# Patient Record
Sex: Female | Born: 1935 | Race: White | Hispanic: No | Smoking: Former smoker
Health system: Southern US, Community
[De-identification: ages and names within clinical notes are randomized; demographics above are authoritative.]

## PROBLEM LIST (undated history)

## (undated) DIAGNOSIS — T4145XA Adverse effect of unspecified anesthetic, initial encounter: Secondary | ICD-10-CM

## (undated) DIAGNOSIS — I2510 Atherosclerotic heart disease of native coronary artery without angina pectoris: Secondary | ICD-10-CM

## (undated) DIAGNOSIS — Z975 Presence of (intrauterine) contraceptive device: Secondary | ICD-10-CM

## (undated) DIAGNOSIS — Z923 Personal history of irradiation: Secondary | ICD-10-CM

## (undated) DIAGNOSIS — M1990 Unspecified osteoarthritis, unspecified site: Secondary | ICD-10-CM

## (undated) DIAGNOSIS — I10 Essential (primary) hypertension: Secondary | ICD-10-CM

## (undated) DIAGNOSIS — N813 Complete uterovaginal prolapse: Secondary | ICD-10-CM

## (undated) DIAGNOSIS — D649 Anemia, unspecified: Secondary | ICD-10-CM

## (undated) DIAGNOSIS — I6529 Occlusion and stenosis of unspecified carotid artery: Secondary | ICD-10-CM

## (undated) DIAGNOSIS — J189 Pneumonia, unspecified organism: Secondary | ICD-10-CM

## (undated) DIAGNOSIS — R0600 Dyspnea, unspecified: Secondary | ICD-10-CM

## (undated) DIAGNOSIS — R200 Anesthesia of skin: Secondary | ICD-10-CM

## (undated) DIAGNOSIS — R7303 Prediabetes: Secondary | ICD-10-CM

## (undated) DIAGNOSIS — Z9289 Personal history of other medical treatment: Secondary | ICD-10-CM

## (undated) DIAGNOSIS — K5792 Diverticulitis of intestine, part unspecified, without perforation or abscess without bleeding: Secondary | ICD-10-CM

## (undated) DIAGNOSIS — K5900 Constipation, unspecified: Secondary | ICD-10-CM

## (undated) DIAGNOSIS — Z972 Presence of dental prosthetic device (complete) (partial): Secondary | ICD-10-CM

## (undated) DIAGNOSIS — E039 Hypothyroidism, unspecified: Secondary | ICD-10-CM

## (undated) DIAGNOSIS — C3490 Malignant neoplasm of unspecified part of unspecified bronchus or lung: Secondary | ICD-10-CM

## (undated) DIAGNOSIS — T8859XA Other complications of anesthesia, initial encounter: Secondary | ICD-10-CM

## (undated) DIAGNOSIS — M419 Scoliosis, unspecified: Secondary | ICD-10-CM

## (undated) DIAGNOSIS — Z973 Presence of spectacles and contact lenses: Secondary | ICD-10-CM

## (undated) DIAGNOSIS — N189 Chronic kidney disease, unspecified: Secondary | ICD-10-CM

## (undated) HISTORY — DX: Presence of (intrauterine) contraceptive device: Z97.5

## (undated) HISTORY — PX: SPINE SURGERY: SHX786

## (undated) HISTORY — DX: Personal history of irradiation: Z92.3

## (undated) HISTORY — DX: Malignant neoplasm of unspecified part of unspecified bronchus or lung: C34.90

## (undated) HISTORY — PX: OTHER SURGICAL HISTORY: SHX169

## (undated) HISTORY — PX: TONSILLECTOMY: SUR1361

## (undated) HISTORY — PX: FRACTURE SURGERY: SHX138

## (undated) HISTORY — PX: BACK SURGERY: SHX140

## (undated) HISTORY — PX: ELBOW SURGERY: SHX618

---
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
## 1978-08-25 HISTORY — PX: BACK SURGERY: SHX140

## 2002-03-04 ENCOUNTER — Encounter: Admission: RE | Admit: 2002-03-04 | Discharge: 2002-03-04 | Payer: Self-pay | Admitting: Internal Medicine

## 2002-03-04 ENCOUNTER — Encounter: Payer: Self-pay | Admitting: Internal Medicine

## 2002-03-17 ENCOUNTER — Encounter: Admission: RE | Admit: 2002-03-17 | Discharge: 2002-03-17 | Payer: Self-pay | Admitting: Internal Medicine

## 2002-03-17 ENCOUNTER — Encounter: Payer: Self-pay | Admitting: Internal Medicine

## 2002-03-31 ENCOUNTER — Other Ambulatory Visit: Admission: RE | Admit: 2002-03-31 | Discharge: 2002-03-31 | Payer: Self-pay | Admitting: Obstetrics and Gynecology

## 2002-04-08 ENCOUNTER — Encounter: Admission: RE | Admit: 2002-04-08 | Discharge: 2002-04-08 | Payer: Self-pay | Admitting: Obstetrics and Gynecology

## 2002-04-08 ENCOUNTER — Encounter: Payer: Self-pay | Admitting: Obstetrics and Gynecology

## 2003-10-13 ENCOUNTER — Encounter: Admission: RE | Admit: 2003-10-13 | Discharge: 2003-10-13 | Payer: Self-pay | Admitting: Internal Medicine

## 2003-10-30 ENCOUNTER — Encounter: Admission: RE | Admit: 2003-10-30 | Discharge: 2003-10-30 | Payer: Self-pay | Admitting: Internal Medicine

## 2003-12-01 ENCOUNTER — Ambulatory Visit (HOSPITAL_COMMUNITY): Admission: RE | Admit: 2003-12-01 | Discharge: 2003-12-01 | Payer: Self-pay | Admitting: Gastroenterology

## 2003-12-01 ENCOUNTER — Encounter (INDEPENDENT_AMBULATORY_CARE_PROVIDER_SITE_OTHER): Payer: Self-pay | Admitting: *Deleted

## 2010-08-27 ENCOUNTER — Encounter
Admission: RE | Admit: 2010-08-27 | Discharge: 2010-08-27 | Payer: Self-pay | Source: Home / Self Care | Attending: Internal Medicine | Admitting: Internal Medicine

## 2010-09-15 ENCOUNTER — Encounter: Payer: Self-pay | Admitting: Internal Medicine

## 2011-01-10 NOTE — Op Note (Signed)
NAME:  Abigail [name], Abigail [name]                            ACCOUNT NO.:  [id number]   MEDICAL RECORD NO.:  [id number]                   PATIENT TYPE:  AMB   LOCATION:  ENDO                                 FACILITY:  [facility]   PHYSICIAN:  Anselmo Rod, M.D.               DATE OF BIRTH:  [birth year]   DATE OF PROCEDURE:  12/01/2003  DATE OF DISCHARGE:                                 OPERATIVE REPORT   PROCEDURE:  Colonoscopy with snare polypectomy x 4 and cold biopsies x 1.   ENDOSCOPIST:  Charna Elizabeth, M.D.   INSTRUMENT USED:  Olympus video colonoscope.   INDICATIONS FOR PROCEDURE:  75 year old white female undergoing screening  colonoscopy.  The patient has a family history of colon cancer and recent  change in bowel habits.  Rule out colonic polyps, masses, etc.   PREPROCEDURE PREPARATION:  Informed consent was procured from the patient.  The patient was fasted for eight hours prior to the procedure and prepped  with a bottle of magnesium citrate and a gallon of GoLYTELY the night prior  to the procedure.   PREPROCEDURE PHYSICAL:  Patient with stable vital signs.  Neck supple.  Chest clear to auscultation.  S1 and S2 regular.  Abdomen soft with normal  bowel sounds.   DESCRIPTION OF PROCEDURE:  The patient was placed in the left lateral  decubitus position, sedated with 60 mg of Demerol and 7 mg Versed in slow  incremental doses.  Once the patient was adequately sedated, maintained on  low flow oxygen and continuous cardiac monitoring, the Olympus video  colonoscope was advanced into the rectum to the cecum.  The appendiceal  orifice and ileocecal valve were clearly visualized and photographed.  There  was extensive sigmoid diverticulosis and abdominal discomfort with  insufflation of air into the colon indicating a component of visceral  hypersensitivity consistent with IBS.  Two small polyps were snared from the  rectum, three polyps were removed from the left colon, two by snare  polypectomy and one by cold biopsy forceps.  A few scattered diverticula  were seen along the hepatic flexure.  The right colon appeared normal.   IMPRESSION:  1. Multiple colonic polyps removed by snare polypectomy and one removed by     cold biopsy forceps (see description above).  2. Extensive sigmoid diverticulosis with a few scattered diverticula around     the hepatic flexure.  3. Visceral hypersensitivity indicated by abdominal discomfort while     insufflating air in the colon, question IBS.   RECOMMENDATIONS:  1. Avoid nonsteroidals for the next four weeks.  2. Await pathology results.  3. High fiber diet with liberal fluid intake.  4. Outpatient follow up in the next two weeks for further recommendations.  Anselmo Rod, M.D.    JNM/MEDQ  D:  12/01/2003  T:  12/01/2003  Job:  [id number]   cc:   Olene Craven, M.D.  [address]  [address]  [place]  [place] [address]  Fax: [phone]

## 2011-09-10 ENCOUNTER — Other Ambulatory Visit: Payer: Self-pay | Admitting: Dermatology

## 2011-09-10 DIAGNOSIS — D485 Neoplasm of uncertain behavior of skin: Secondary | ICD-10-CM | POA: Diagnosis not present

## 2011-09-10 DIAGNOSIS — L821 Other seborrheic keratosis: Secondary | ICD-10-CM | POA: Diagnosis not present

## 2011-09-10 DIAGNOSIS — L570 Actinic keratosis: Secondary | ICD-10-CM | POA: Diagnosis not present

## 2012-04-05 ENCOUNTER — Inpatient Hospital Stay (HOSPITAL_COMMUNITY)
Admission: EM | Admit: 2012-04-05 | Discharge: 2012-04-12 | DRG: 194 | Disposition: A | Discharge status: Home / Self Care | Payer: Medicare Other | Attending: Internal Medicine | Admitting: Internal Medicine

## 2012-04-05 ENCOUNTER — Encounter (HOSPITAL_COMMUNITY): Payer: Self-pay | Admitting: Emergency Medicine

## 2012-04-05 ENCOUNTER — Ambulatory Visit (INDEPENDENT_AMBULATORY_CARE_PROVIDER_SITE_OTHER): Payer: Medicare Other | Admitting: Family Medicine

## 2012-04-05 ENCOUNTER — Emergency Department (HOSPITAL_COMMUNITY): Payer: Medicare Other

## 2012-04-05 VITALS — BP 156/64 | HR 96 | Temp 98.6°F | Resp 16 | Ht 64.0 in | Wt 167.0 lb

## 2012-04-05 DIAGNOSIS — E039 Hypothyroidism, unspecified: Secondary | ICD-10-CM | POA: Diagnosis present

## 2012-04-05 DIAGNOSIS — E871 Hypo-osmolality and hyponatremia: Secondary | ICD-10-CM | POA: Diagnosis present

## 2012-04-05 DIAGNOSIS — R0602 Shortness of breath: Secondary | ICD-10-CM | POA: Diagnosis not present

## 2012-04-05 DIAGNOSIS — R1032 Left lower quadrant pain: Secondary | ICD-10-CM | POA: Diagnosis present

## 2012-04-05 DIAGNOSIS — A498 Other bacterial infections of unspecified site: Secondary | ICD-10-CM | POA: Diagnosis present

## 2012-04-05 DIAGNOSIS — Z8719 Personal history of other diseases of the digestive system: Secondary | ICD-10-CM

## 2012-04-05 DIAGNOSIS — R112 Nausea with vomiting, unspecified: Secondary | ICD-10-CM | POA: Diagnosis not present

## 2012-04-05 DIAGNOSIS — R7881 Bacteremia: Secondary | ICD-10-CM | POA: Diagnosis not present

## 2012-04-05 DIAGNOSIS — J189 Pneumonia, unspecified organism: Principal | ICD-10-CM | POA: Diagnosis present

## 2012-04-05 DIAGNOSIS — N179 Acute kidney failure, unspecified: Secondary | ICD-10-CM | POA: Diagnosis not present

## 2012-04-05 DIAGNOSIS — N390 Urinary tract infection, site not specified: Secondary | ICD-10-CM | POA: Diagnosis present

## 2012-04-05 DIAGNOSIS — Z79899 Other long term (current) drug therapy: Secondary | ICD-10-CM | POA: Diagnosis not present

## 2012-04-05 DIAGNOSIS — D649 Anemia, unspecified: Secondary | ICD-10-CM | POA: Diagnosis present

## 2012-04-05 DIAGNOSIS — E869 Volume depletion, unspecified: Secondary | ICD-10-CM

## 2012-04-05 DIAGNOSIS — R197 Diarrhea, unspecified: Secondary | ICD-10-CM | POA: Diagnosis not present

## 2012-04-05 DIAGNOSIS — R5383 Other fatigue: Secondary | ICD-10-CM | POA: Diagnosis not present

## 2012-04-05 DIAGNOSIS — I10 Essential (primary) hypertension: Secondary | ICD-10-CM | POA: Diagnosis present

## 2012-04-05 DIAGNOSIS — R42 Dizziness and giddiness: Secondary | ICD-10-CM | POA: Diagnosis not present

## 2012-04-05 DIAGNOSIS — R5381 Other malaise: Secondary | ICD-10-CM

## 2012-04-05 DIAGNOSIS — R109 Unspecified abdominal pain: Secondary | ICD-10-CM | POA: Diagnosis not present

## 2012-04-05 DIAGNOSIS — E785 Hyperlipidemia, unspecified: Secondary | ICD-10-CM | POA: Diagnosis present

## 2012-04-05 DIAGNOSIS — R1110 Vomiting, unspecified: Secondary | ICD-10-CM | POA: Diagnosis not present

## 2012-04-05 HISTORY — DX: Left lower quadrant pain: R10.32

## 2012-04-05 HISTORY — DX: Adverse effect of unspecified anesthetic, initial encounter: T41.45XA

## 2012-04-05 HISTORY — DX: Diverticulitis of intestine, part unspecified, without perforation or abscess without bleeding: K57.92

## 2012-04-05 HISTORY — DX: Essential (primary) hypertension: I10

## 2012-04-05 HISTORY — DX: Hypothyroidism, unspecified: E03.9

## 2012-04-05 HISTORY — DX: Other complications of anesthesia, initial encounter: T88.59XA

## 2012-04-05 HISTORY — DX: Occlusion and stenosis of unspecified carotid artery: I65.29

## 2012-04-05 LAB — POCT CBC
Granulocyte percent: 94.3 %G — AB (ref 37–80)
HCT, POC: 42 % (ref 37.7–47.9)
Hemoglobin: 13.1 g/dL (ref 12.2–16.2)
Lymph, poc: 0.6 (ref 0.6–3.4)
MCH, POC: 29.7 pg (ref 27–31.2)
MCHC: 31.2 g/dL — AB (ref 31.8–35.4)
MCV: 95.3 fL (ref 80–97)
MID (cbc): 0.6 (ref 0–0.9)
MPV: 8.2 fL (ref 0–99.8)
POC Granulocyte: 19.7 — AB (ref 2–6.9)
POC LYMPH PERCENT: 2.9 %L — AB (ref 10–50)
POC MID %: 2.8 %M (ref 0–12)
Platelet Count, POC: 345 10*3/uL (ref 142–424)
RBC: 4.41 M/uL (ref 4.04–5.48)
RDW, POC: 14.7 %
WBC: 20.9 10*3/uL — AB (ref 4.6–10.2)

## 2012-04-05 LAB — URINALYSIS, ROUTINE W REFLEX MICROSCOPIC
Bilirubin Urine: NEGATIVE
Glucose, UA: NEGATIVE mg/dL
Ketones, ur: NEGATIVE mg/dL
Nitrite: POSITIVE — AB
Protein, ur: 30 mg/dL — AB
Specific Gravity, Urine: 1.015 (ref 1.005–1.030)
Urobilinogen, UA: 0.2 mg/dL (ref 0.0–1.0)
pH: 6 (ref 5.0–8.0)

## 2012-04-05 LAB — COMPREHENSIVE METABOLIC PANEL
ALT: 16 U/L (ref 0–35)
AST: 27 U/L (ref 0–37)
Albumin: 3.8 g/dL (ref 3.5–5.2)
Alkaline Phosphatase: 63 U/L (ref 39–117)
BUN: 30 mg/dL — ABNORMAL HIGH (ref 6–23)
CO2: 21 mEq/L (ref 19–32)
Calcium: 8.9 mg/dL (ref 8.4–10.5)
Chloride: 94 mEq/L — ABNORMAL LOW (ref 96–112)
Creatinine, Ser: 1.6 mg/dL — ABNORMAL HIGH (ref 0.50–1.10)
GFR calc Af Amer: 36 mL/min — ABNORMAL LOW (ref >90)
GFR calc non Af Amer: 31 mL/min — ABNORMAL LOW (ref >90)
Glucose, Bld: 124 mg/dL — ABNORMAL HIGH (ref 70–99)
Potassium: 4.4 mEq/L (ref 3.5–5.1)
Sodium: 129 mEq/L — ABNORMAL LOW (ref 135–145)
Total Bilirubin: 0.4 mg/dL (ref 0.3–1.2)
Total Protein: 7.2 g/dL (ref 6.0–8.3)

## 2012-04-05 LAB — CBC WITH DIFFERENTIAL/PLATELET
Basophils Absolute: 0 10*3/uL (ref 0.0–0.1)
Basophils Relative: 0 % (ref 0–1)
Eosinophils Absolute: 0 10*3/uL (ref 0.0–0.7)
Eosinophils Relative: 0 % (ref 0–5)
HCT: 33.9 % — ABNORMAL LOW (ref 36.0–46.0)
Hemoglobin: 11.6 g/dL — ABNORMAL LOW (ref 12.0–15.0)
Lymphocytes Relative: 3 % — ABNORMAL LOW (ref 12–46)
Lymphs Abs: 0.5 10*3/uL — ABNORMAL LOW (ref 0.7–4.0)
MCH: 30.9 pg (ref 26.0–34.0)
MCHC: 34.2 g/dL (ref 30.0–36.0)
MCV: 90.2 fL (ref 78.0–100.0)
Monocytes Absolute: 1.3 10*3/uL — ABNORMAL HIGH (ref 0.1–1.0)
Monocytes Relative: 7 % (ref 3–12)
Neutro Abs: 16.9 10*3/uL — ABNORMAL HIGH (ref 1.7–7.7)
Neutrophils Relative %: 90 % — ABNORMAL HIGH (ref 43–77)
Platelets: 232 10*3/uL (ref 150–400)
RBC: 3.76 MIL/uL — ABNORMAL LOW (ref 3.87–5.11)
RDW: 13.6 % (ref 11.5–15.5)
WBC: 18.7 10*3/uL — ABNORMAL HIGH (ref 4.0–10.5)

## 2012-04-05 LAB — URINE MICROSCOPIC-ADD ON

## 2012-04-05 LAB — LACTIC ACID, PLASMA: Lactic Acid, Venous: 1.9 mmol/L (ref 0.5–2.2)

## 2012-04-05 LAB — LIPASE, BLOOD: Lipase: 26 U/L (ref 11–59)

## 2012-04-05 MED ORDER — VITAMIN D3 25 MCG (1000 UNIT) PO TABS
1000.0000 [IU] | ORAL_TABLET | Freq: Every day | ORAL | Status: DC
  Administered 2012-04-05 – 2012-04-12 (×8): 1000 [IU] via ORAL
  Filled 2012-04-05 (×8): qty 1

## 2012-04-05 MED ORDER — LEVOTHYROXINE SODIUM 200 MCG PO TABS: 200.0000 ug | ORAL_TABLET | Freq: Every day | ORAL | Status: DC

## 2012-04-05 MED ORDER — ALBUTEROL SULFATE (5 MG/ML) 0.5% IN NEBU
2.5000 mg | INHALATION_SOLUTION | Freq: Four times a day (QID) | RESPIRATORY_TRACT | Status: DC
  Administered 2012-04-05 – 2012-04-06 (×2): 2.5 mg via RESPIRATORY_TRACT
  Filled 2012-04-05 (×2): qty 0.5

## 2012-04-05 MED ORDER — DILTIAZEM HCL ER 180 MG PO CP24
180.0000 mg | ORAL_CAPSULE | Freq: Every day | ORAL | Status: DC
  Administered 2012-04-06 – 2012-04-12 (×7): 180 mg via ORAL
  Filled 2012-04-05 (×7): qty 1

## 2012-04-05 MED ORDER — SIMVASTATIN 20 MG PO TABS: 20.0000 mg | ORAL_TABLET | Freq: Every day | ORAL | Status: DC

## 2012-04-05 MED ORDER — SODIUM CHLORIDE 0.9 % IV SOLN
INTRAVENOUS | Status: DC
  Administered 2012-04-06 – 2012-04-09 (×8): via INTRAVENOUS
  Administered 2012-04-10: 1000 mL via INTRAVENOUS

## 2012-04-05 MED ORDER — DEXTROSE 5 % IV SOLN
1.0000 g | Freq: Once | INTRAVENOUS | Status: AC
  Administered 2012-04-05: 1 g via INTRAVENOUS

## 2012-04-05 MED ORDER — LISINOPRIL 10 MG PO TABS
10.0000 mg | ORAL_TABLET | Freq: Every day | ORAL | Status: DC
  Filled 2012-04-05: qty 1

## 2012-04-05 MED ORDER — ASPIRIN EC 81 MG PO TBEC
81.0000 mg | DELAYED_RELEASE_TABLET | Freq: Every day | ORAL | Status: DC
  Administered 2012-04-05 – 2012-04-12 (×8): 81 mg via ORAL
  Filled 2012-04-05 (×8): qty 1

## 2012-04-05 MED ORDER — IPRATROPIUM BROMIDE 0.02 % IN SOLN
0.5000 mg | Freq: Four times a day (QID) | RESPIRATORY_TRACT | Status: DC
  Administered 2012-04-05 – 2012-04-06 (×2): 0.5 mg via RESPIRATORY_TRACT
  Filled 2012-04-05 (×2): qty 2.5

## 2012-04-05 MED ORDER — SODIUM CHLORIDE 0.9 % IV BOLUS (SEPSIS)
1000.0000 mL | Freq: Once | INTRAVENOUS | Status: AC
  Administered 2012-04-05: 1000 mL via INTRAVENOUS

## 2012-04-05 MED ORDER — ASPIRIN 81 MG PO TABS: 81.0000 mg | ORAL_TABLET | Freq: Every day | ORAL | Status: DC

## 2012-04-05 MED ORDER — ONDANSETRON HCL 4 MG/2ML IJ SOLN
4.0000 mg | Freq: Once | INTRAMUSCULAR | Status: AC
  Administered 2012-04-05: 4 mg via INTRAVENOUS
  Filled 2012-04-05: qty 2

## 2012-04-05 MED ORDER — DEXTROSE 5 % IV SOLN
1.0000 g | INTRAVENOUS | Status: DC
  Administered 2012-04-06 – 2012-04-07 (×2): 1 g via INTRAVENOUS
  Filled 2012-04-05 (×2): qty 10

## 2012-04-05 MED ORDER — LEVOTHYROXINE SODIUM 175 MCG PO TABS: 175.0000 ug | ORAL_TABLET | Freq: Every day | ORAL | Status: DC

## 2012-04-05 MED ORDER — DILTIAZEM HCL ER 180 MG PO CP24
180.0000 mg | ORAL_CAPSULE | Freq: Every day | ORAL | Status: DC
  Filled 2012-04-05: qty 1

## 2012-04-05 MED ORDER — LEVOTHYROXINE SODIUM 200 MCG PO TABS
200.0000 ug | ORAL_TABLET | Freq: Every day | ORAL | Status: DC
  Filled 2012-04-05 (×3): qty 1

## 2012-04-05 MED ORDER — PRAVASTATIN SODIUM 40 MG PO TABS
40.0000 mg | ORAL_TABLET | Freq: Every day | ORAL | Status: DC
  Administered 2012-04-05 – 2012-04-12 (×8): 40 mg via ORAL
  Filled 2012-04-05 (×8): qty 1

## 2012-04-05 MED ORDER — LISINOPRIL 10 MG PO TABS
10.0000 mg | ORAL_TABLET | Freq: Every day | ORAL | Status: DC
  Administered 2012-04-06 – 2012-04-12 (×7): 10 mg via ORAL
  Filled 2012-04-05 (×7): qty 1

## 2012-04-05 MED ORDER — DEXTROSE 5 % IV SOLN
500.0000 mg | Freq: Once | INTRAVENOUS | Status: AC
  Administered 2012-04-05: 500 mg via INTRAVENOUS
  Filled 2012-04-05: qty 500

## 2012-04-05 MED ORDER — GUAIFENESIN ER 600 MG PO TB12
600.0000 mg | ORAL_TABLET | Freq: Two times a day (BID) | ORAL | Status: DC
  Administered 2012-04-05 – 2012-04-12 (×14): 600 mg via ORAL
  Filled 2012-04-05 (×15): qty 1

## 2012-04-05 MED ORDER — AZITHROMYCIN 500 MG PO TABS
500.0000 mg | ORAL_TABLET | ORAL | Status: DC
  Administered 2012-04-06 – 2012-04-07 (×2): 500 mg via ORAL
  Filled 2012-04-05 (×3): qty 1

## 2012-04-05 MED ORDER — LEVOTHYROXINE SODIUM 175 MCG PO TABS
175.0000 ug | ORAL_TABLET | Freq: Every day | ORAL | Status: DC
  Administered 2012-04-07: 175 ug via ORAL
  Filled 2012-04-05 (×2): qty 1

## 2012-04-05 NOTE — Progress Notes (Signed)
Subjective:    Patient ID: Abigail [name], female    DOB: 07/14/1936, 76 y.o.   MRN: [id number]  HPI Abigail [name] is a 76 y.o. female Started about 10pm last night. Lower abdominal pressure - bm's every 5-10 minutes, but formed stools.  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  Has been up all night, but usually 4-5 hours of sleep.  After midnight, felt dizzy - felt nausea, dry heaves, minimal phlegm.  Lower abdominal pain - to L side,  feels like pulled something when dry heaved.  Sore with breathing - catches.  Subjective fever only.  Had sweats when dry heaving.  Has not eaten since yesterday afternoon, drinking a little bit of water.    Hx of diverticultis - last episode 6 years ago.   No chest pain.  Hx of htn, no hx of MI. Hx of hypothyroid.    Felt ok yesterday at family reunion.    Review of Systems  Constitutional: Positive for fever and fatigue.  Gastrointestinal: Positive for abdominal pain. Negative for diarrhea (multiple bm's only last night - to this morning - lessened now.  ), constipation and blood in stool.  Genitourinary: Positive for dysuria, urgency (chronic.), frequency and decreased urine volume (small amounts, but frequently.  ).       Objective:   Physical Exam  Constitutional: She is oriented to person, place, and time. She appears well-developed and well-nourished.       Appears fatigued, uncomfortable, but not toxic.   HENT:  Head: Normocephalic and atraumatic.  Mouth/Throat: Mucous membranes are dry.  Cardiovascular: Normal rate, regular rhythm, normal heart sounds and intact distal pulses.   Pulmonary/Chest: Effort normal and breath sounds normal.       No rib ttp.   Abdominal: Soft. Bowel sounds are normal. There is tenderness in the suprapubic area and left lower quadrant. There is no rigidity, no rebound, no guarding, no CVA tenderness, no tenderness at McBurney's point and negative Murphy's sign.    Neurological: She is alert and oriented to person,  place, and time.  Skin: Skin is warm and dry.       Nl turgor.   Psychiatric: She has a normal mood and affect. Her behavior is normal.   Results for orders placed in visit on 04/05/12  POCT CBC      Component Value Range   WBC 20.9 (*) 4.6 - 10.2 K/uL   Lymph, poc 0.6  0.6 - 3.4   POC LYMPH PERCENT 2.9 (*) 10 - 50 %L   MID (cbc) 0.6  0 - 0.9   POC MID % 2.8  0 - 12 %M   POC Granulocyte 19.7 (*) 2 - 6.9   Granulocyte percent 94.3 (*) 37 - 80 %G   RBC 4.41  4.04 - 5.48 M/uL   Hemoglobin 13.1  12.2 - 16.2 g/dL   HCT, POC 91.4  78.2 - 47.9 %   MCV 95.3  80 - 97 fL   MCH, POC 29.7  27 - 31.2 pg   MCHC 31.2 (*) 31.8 - 35.4 g/dL   RDW, POC 95.6     Platelet Count, POC 345  142 - 424 K/uL   MPV 8.2  0 - 99.8 fL       Assessment & Plan:  Abigail [name] is a 76 y.o. female Acute onset LLQ abd pain, n/v and frequent stools overnight. Fatigue, malaise and dizziness this am. Pain radiating to L flank  now.  Concerning for diverticulitis +/- abcess, or UTI with early pyelo.  Volume depletion.   IV placed, for access and hydration. Unable to obtain urine specimen.  Transport to ER for eval, likely CT abd/pelvis to r/o abcess with diverticulitis.  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

## 2012-04-05 NOTE — ED Provider Notes (Signed)
History     CSN: [id number]  Arrival date & time 04/05/12  1314   First MD Initiated Contact with Patient 04/05/12 1325      Chief Complaint  Patient presents with  . Abdominal Pain    (Consider location/radiation/quality/duration/timing/severity/associated sxs/prior treatment) Patient is a 76 y.o. female presenting with abdominal pain.  Abdominal Pain The primary symptoms of the illness include abdominal pain, nausea, vomiting and diarrhea. The current episode started 6 to 12 hours ago. The onset of the illness was sudden. Progression since onset: Vomiting and diarrhea are improving. Patient's lower abdominal pain has resolved but after an episode of vomiting developed sharp left-sided pain that has persisted.  The abdominal pain is located in the LUQ. The abdominal pain does not radiate. The severity of the abdominal pain is 7/10. The abdominal pain is relieved by nothing. The abdominal pain is exacerbated by deep breathing.  The vomiting began yesterday. Vomiting occurs 6 to 10 times per day. The emesis contains stomach contents.  The diarrhea began yesterday. The diarrhea is watery. The diarrhea occurs 5 to 10 times per day.  Symptoms associated with the illness do not include chills or anorexia.    Past Medical History  Diagnosis Date  . Diverticulitis   . Hypothyroid   . Hypertension   . Carotid artery occlusion     History reviewed. No pertinent past surgical history.  History reviewed. No pertinent family history.  History  Substance Use Topics  . Smoking status: Never Smoker   . Smokeless tobacco: Not on file  . Alcohol Use: No    OB History    Grav Para Term Preterm Abortions TAB SAB Ect Mult Living                  Review of Systems  Constitutional: Negative for chills.  Gastrointestinal: Positive for nausea, vomiting, abdominal pain and diarrhea. Negative for anorexia.  All other systems reviewed and are negative.    Allergies  Codeine  Home  Medications   Current Outpatient Rx  Name Route Sig Dispense Refill  . ASPIRIN 81 MG PO TABS Oral Take 81 mg by mouth daily.    [name] Kitchen VITAMIN D 1000 UNITS PO TABS Oral Take 1,000 Units by mouth daily.    [name] Kitchen COENZYME Q10 30 MG PO CAPS Oral Take 30 mg by mouth 3 (three) times daily.    [name] Kitchen DILTIAZEM HCL ER 180 MG PO CP24 Oral Take 180 mg by mouth daily.    [name] Kitchen LEVOTHYROXINE SODIUM 175 MCG PO TABS Oral Take 175 mcg by mouth daily.    [name] Kitchen LEVOTHYROXINE SODIUM 200 MCG PO TABS Oral Take 200 mcg by mouth daily. Pt alternates between 175 mcg and the 200 mcg daily    . LISINOPRIL-HYDROCHLOROTHIAZIDE 20-25 MG PO TABS Oral Take 1 tablet by mouth daily.    [name] Kitchen PRAVASTATIN SODIUM 40 MG PO TABS Oral Take 40 mg by mouth daily.      BP 127/87  Pulse 99  Temp 99 F (37.2 C) (Oral)  Resp 18  SpO2 94%  Physical Exam  Nursing note and vitals reviewed. Constitutional: She is oriented to person, place, and time. She appears well-developed and well-nourished. No distress.  HENT:  Head: Normocephalic and atraumatic.  Mouth/Throat: Oropharynx is clear and moist. Mucous membranes are dry.  Eyes: Conjunctivae and EOM are normal. Pupils are equal, round, and reactive to light.  Neck: Normal range of motion. Neck supple.  Cardiovascular: Normal rate, regular rhythm and intact distal  pulses.   No murmur heard. Pulmonary/Chest: Effort normal and breath sounds normal. No respiratory distress. She has no wheezes. She has no rales.  Abdominal: Soft. Normal appearance and bowel sounds are normal. She exhibits no distension. There is tenderness. There is no rebound and no guarding.    Musculoskeletal: Normal range of motion. She exhibits no edema and no tenderness.  Neurological: She is alert and oriented to person, place, and time.  Skin: Skin is warm and dry. No rash noted. No erythema.  Psychiatric: She has a normal mood and affect. Her behavior is normal.    ED Course  Procedures (including critical care time)  Labs  Reviewed  COMPREHENSIVE METABOLIC PANEL - Abnormal; Notable for the following:    Sodium 129 (*)     Chloride 94 (*)     Glucose, Bld 124 (*)     BUN 30 (*)     Creatinine, Ser 1.60 (*)     GFR calc non Af Amer 31 (*)     GFR calc Af Amer 36 (*)     All other components within normal limits  CBC WITH DIFFERENTIAL - Abnormal; Notable for the following:    WBC 18.7 (*)     RBC 3.76 (*)     Hemoglobin 11.6 (*)     HCT 33.9 (*)     Neutrophils Relative 90 (*)     Neutro Abs 16.9 (*)     Lymphocytes Relative 3 (*)     Lymphs Abs 0.5 (*)     Monocytes Absolute 1.3 (*)     All other components within normal limits  LIPASE, BLOOD  URINALYSIS, ROUTINE W REFLEX MICROSCOPIC  LACTIC ACID, PLASMA   No results found.   No diagnosis found.    MDM   Pt with symptoms most consistent with a viral process with fever/vomitting/diarrhea started abruptly last night and now starting to get better.  States after a vomiting episode she developed pain in her left side. Denies bad food exposure and recent travel out of the country.  No recent abx.  No hx concerning for GU pathology or kidney stones.  Pt is awake and alert on exam without peritoneal signs. Pt initially sent for possible diverticulitis or pyelo however pt is not having lower abd pain now and after only 12 hours of sx low suspicion for abscess/diverticulitis.    CBC, CMP, UA, lipase pending. Patient given IV fluids and Zofran as she appears dehydrated. Denies any respiratory or cardiac symptoms. On exam patient has no abdominal tenderness and the only pain she complains of appears to be more from muscle strain from vomiting.  3:39 PM On re-eval pt can not tell me if she is feeling better.  Labs with leukocytosis of 18,000 and Cr is 1.6 without old to compare.  ON re-evaluation pt O2 sats are 84% with good wave form even though pt is not c/o of SOB, cough or CP.  Will get CXR.      Gwyneth Sprout, MD 04/06/12 2042

## 2012-04-05 NOTE — ED Notes (Signed)
Attempted to call report to 3W. RN unable to take report at this time. Will call back. 

## 2012-04-05 NOTE — ED Notes (Signed)
Patient returned from X-ray 

## 2012-04-05 NOTE — ED Notes (Signed)
MD at bedside. Dr. Plunkett at bedside.  

## 2012-04-05 NOTE — ED Notes (Signed)
Pt transferred to 3W at this time. RN unassigned.

## 2012-04-05 NOTE — ED Notes (Signed)
ZOX:[id number]<AV> Expected date:04/05/12<BR> Expected time: 1:05 PM<BR> Means of arrival:Ambulance<BR> Comments:<BR> Pt from UC to r/o diverticulitis

## 2012-04-05 NOTE — ED Provider Notes (Signed)
76yo F, presents to the ED c/o N/V/D. Seen by PMD, sent to ED for further eval.  VSS, afebrile, resps easy.  WBC elevated.  Rec'd sign out from Dr. Anitra Lauth, awaiting CXR and UA/UC.  +UTI, UC pending.  CXR +pneumonia.  IV rocephin and zithromax ordered.  Dx testing d/w pt and family.  Questions answered.  Verb understanding, agreeable to admit.  T/C to [facility]. Arthor Captain, case discussed, including:  HPI, pertinent PM/SHx, VS/PE, dx testing, ED course and treatment:  Agreeable to admit, requests to obtain medical bed to team 8.  Results for orders placed during the hospital encounter of 04/05/12  COMPREHENSIVE METABOLIC PANEL      Component Value Range   Sodium 129 (*) 135 - 145 mEq/L   Potassium 4.4  3.5 - 5.1 mEq/L   Chloride 94 (*) 96 - 112 mEq/L   CO2 21  19 - 32 mEq/L   Glucose, Bld 124 (*) 70 - 99 mg/dL   BUN 30 (*) 6 - 23 mg/dL   Creatinine, Ser 8.29 (*) 0.50 - 1.10 mg/dL   Calcium 8.9  8.4 - 56.2 mg/dL   Total Protein 7.2  6.0 - 8.3 g/dL   Albumin 3.8  3.5 - 5.2 g/dL   AST 27  0 - 37 U/L   ALT 16  0 - 35 U/L   Alkaline Phosphatase 63  39 - 117 U/L   Total Bilirubin 0.4  0.3 - 1.2 mg/dL   GFR calc non Af Amer 31 (*) >90 mL/min   GFR calc Af Amer 36 (*) >90 mL/min  URINALYSIS, ROUTINE W REFLEX MICROSCOPIC      Component Value Range   Color, Urine YELLOW  YELLOW   APPearance TURBID (*) CLEAR   Specific Gravity, Urine 1.015  1.005 - 1.030   pH 6.0  5.0 - 8.0   Glucose, UA NEGATIVE  NEGATIVE mg/dL   Hgb urine dipstick LARGE (*) NEGATIVE   Bilirubin Urine NEGATIVE  NEGATIVE   Ketones, ur NEGATIVE  NEGATIVE mg/dL   Protein, ur 30 (*) NEGATIVE mg/dL   Urobilinogen, UA 0.2  0.0 - 1.0 mg/dL   Nitrite POSITIVE (*) NEGATIVE   Leukocytes, UA LARGE (*) NEGATIVE  LIPASE, BLOOD      Component Value Range   Lipase 26  11 - 59 U/L  CBC WITH DIFFERENTIAL      Component Value Range   WBC 18.7 (*) 4.0 - 10.5 K/uL   RBC 3.76 (*) 3.87 - 5.11 MIL/uL   Hemoglobin 11.6 (*) 12.0 - 15.0 g/dL   HCT 13.0 (*) 86.5 - 78.4 %   MCV 90.2  78.0 - 100.0 fL   MCH 30.9  26.0 - 34.0 pg   MCHC 34.2  30.0 - 36.0 g/dL   RDW 69.6  29.5 - 28.4 %   Platelets 232  150 - 400 K/uL   Neutrophils Relative 90 (*) 43 - 77 %   Neutro Abs 16.9 (*) 1.7 - 7.7 K/uL   Lymphocytes Relative 3 (*) 12 - 46 %   Lymphs Abs 0.5 (*) 0.7 - 4.0 K/uL   Monocytes Relative 7  3 - 12 %   Monocytes Absolute 1.3 (*) 0.1 - 1.0 K/uL   Eosinophils Relative 0  0 - 5 %   Eosinophils Absolute 0.0  0.0 - 0.7 K/uL   Basophils Relative 0  0 - 1 %   Basophils Absolute 0.0  0.0 - 0.1 K/uL  LACTIC ACID, PLASMA  Component Value Range   Lactic Acid, Venous 1.9  0.5 - 2.2 mmol/L  URINE MICROSCOPIC-ADD ON      Component Value Range   Squamous Epithelial / LPF FEW (*) RARE   WBC, UA 21-50  <3 WBC/hpf   RBC / HPF 3-6  <3 RBC/hpf   Bacteria, UA MANY (*) RARE   Dg Chest 2 View 04/05/2012  *RADIOLOGY REPORT*  Clinical Data: Shortness of breath, weakness, former smoking history  CHEST - 2 VIEW  Comparison: None.  Findings: There is patchy parenchymal opacity in the left lower lobe most consistent with pneumonia.  A few air-fluid levels in the retrocardiac region on the lateral view probably represent a hiatal hernia.  The right lung is clear.  There is mild peribronchial thickening present.  The heart is mildly enlarged.  No bony abnormality is seen.  IMPRESSION:  1.  Suspect left lower lobe pneumonia.  Recommend follow-up. 2.  Probable hiatal hernia.  Original Report Authenticated By: Juline Patch, M.D.      Laray Anger, DO 04/05/12 1933

## 2012-04-05 NOTE — H&P (Signed)
[facility] History and Physical  Abigail [name]:[id number] DOB: 03/03/1936 DOA: 04/05/2012  Referring physician: Samuel Jester PCP: Alva Garnet., MD   Chief Complaint: Community-acquired pneumonia  HPI:  Abigail [name] is a 76 year old Caucasian female with past medical history of hypertension, hypothyroidism and diverticulitis. Patient went to urgent care today because of nausea, vomiting abdominal pain. Patient said she was in her usual state of health until last night, when she woke up this morning she started to have these symptoms. She also felt like there is a pain in the left side of her chest and she felt she might pulled a muscle. Patient denies any high-grade fever, denies chills, denies cough/sputum production or shortness of breath. Upon initial evaluation in the emergency department, chest x-ray showed left lower lobe pneumonia, urinalysis is consistent with UTI. The patient admitted to the hospital for further evaluation.  Review of Systems:  Constitutional: negative for anorexia, fevers and sweats Eyes: negative for irritation, redness and visual disturbance Ears, nose, mouth, throat, and face: negative for earaches, epistaxis, nasal congestion and sore throat Respiratory: negative for cough, dyspnea on exertion, sputum and wheezing Cardiovascular: negative for chest pain, dyspnea, lower extremity edema, orthopnea, palpitations and syncope Gastrointestinal: She has abdominal pain, nausea and vomiting. Genitourinary:negative for dysuria, frequency and hematuria Hematologic/lymphatic: negative for bleeding, easy bruising and lymphadenopathy Musculoskeletal:negative for arthralgias, muscle weakness and stiff joints Neurological: negative for coordination problems, gait problems, headaches and weakness Endocrine: negative for diabetic symptoms including polydipsia, polyuria and weight loss Allergic/Immunologic: negative for anaphylaxis, hay fever and  urticaria  Past Medical History  Diagnosis Date  . Diverticulitis   . Hypothyroid   . Hypertension   . Carotid artery occlusion   . Complication of anesthesia     hard to wake up per pt   Past Surgical History  Procedure Date  . Back surgery 1980  . Elbow surgery     left   Social History:  reports that she quit smoking about 26 years ago. She has never used smokeless tobacco. She reports that she does not drink alcohol or use illicit drugs. 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 Codeine Nausea And Vomiting    History reviewed. No pertinent family history.  Prior to Admission medications   Medication Sig Start Date End Date Taking? Authorizing Provider  aspirin 81 MG tablet Take 81 mg by mouth daily.   Yes Historical Provider, MD  cholecalciferol (VITAMIN D) 1000 UNITS tablet Take 1,000 Units by mouth daily.   Yes Historical Provider, MD  co-enzyme Q-10 30 MG capsule Take 30 mg by mouth 3 (three) times daily.   Yes Historical Provider, MD  diltiazem (DILACOR XR) 180 MG 24 hr capsule Take 180 mg by mouth daily.   Yes Historical Provider, MD  levothyroxine (SYNTHROID, LEVOTHROID) 175 MCG tablet Take 175 mcg by mouth daily.   Yes Historical Provider, MD  levothyroxine (SYNTHROID, LEVOTHROID) 200 MCG tablet Take 200 mcg by mouth daily. Pt alternates between 175 mcg and the 200 mcg daily   Yes Historical Provider, MD  lisinopril-hydrochlorothiazide (PRINZIDE,ZESTORETIC) 20-25 MG per tablet Take 1 tablet by mouth daily.   Yes Historical Provider, MD  pravastatin (PRAVACHOL) 40 MG tablet Take 40 mg by mouth daily.   Yes Historical Provider, MD   Physical Exam: Filed Vitals:   04/05/12 1619 04/05/12 1623 04/05/12 1624 04/05/12 1953  BP: 136/61 137/62 138/62 127/46  Pulse: 78  78 72 98  Temp: 99.6 F (37.6 C)   100.6 F (38.1 C)  TempSrc: Rectal   Oral  Resp:    16  SpO2:    97%   General appearance:  alert, cooperative and no distress  Head: Normocephalic, without obvious abnormality, atraumatic  Eyes: conjunctivae/corneas clear. PERRL, EOM's intact. Fundi benign.  Nose: Nares normal. Septum midline. Mucosa normal. No drainage or sinus tenderness.  Throat: lips, mucosa, and tongue normal; teeth and gums normal  Neck: Supple, no masses, no cervical lymphadenopathy, no JVD appreciated, no meningeal signs Resp: clear to auscultation bilaterally  Chest wall: no tenderness  Cardio: regular rate and rhythm, S1, S2 normal, no murmur, click, rub or gallop  GI: soft, but there is suprapubic and LLQ tenderness; bowel sounds normal; no masses, no organomegaly  Extremities: extremities normal, atraumatic, no cyanosis or edema  Skin: Skin color, texture, turgor normal. No rashes or lesions  Neurologic: Alert and oriented X 3, normal strength and tone. Normal symmetric reflexes. Normal coordination and gait   Labs on Admission:  Basic Metabolic Panel:  Lab 04/05/12 1610  NA 129*  K 4.4  CL 94*  CO2 21  GLUCOSE 124*  BUN 30*  CREATININE 1.60*  CALCIUM 8.9  MG --  PHOS --   Liver Function Tests:  Lab 04/05/12 1410  AST 27  ALT 16  ALKPHOS 63  BILITOT 0.4  PROT 7.2  ALBUMIN 3.8    Lab 04/05/12 1410  LIPASE 26  AMYLASE --   No results found for this basename: AMMONIA:5 in the last 168 hours CBC:  Lab 04/05/12 1520 04/05/12 1212  WBC 18.7* 20.9*  NEUTROABS 16.9* --  HGB 11.6* 13.1  HCT 33.9* 42.0  MCV 90.2 95.3  PLT 232 --   Cardiac Enzymes: No results found for this basename: CKTOTAL:5,CKMB:5,CKMBINDEX:5,TROPONINI:5 in the last 168 hours  BNP (last 3 results) No results found for this basename: PROBNP:3 in the last 8760 hours CBG: No results found for this basename: GLUCAP:5 in the last 168 hours  Radiological Exams on Admission: Dg Chest 2 View  04/05/2012  *RADIOLOGY REPORT*  Clinical Data: Shortness of breath, weakness, former smoking history  CHEST - 2 VIEW   Comparison: None.  Findings: There is patchy parenchymal opacity in the left lower lobe most consistent with pneumonia.  A few air-fluid levels in the retrocardiac region on the lateral view probably represent a hiatal hernia.  The right lung is clear.  There is mild peribronchial thickening present.  The heart is mildly enlarged.  No bony abnormality is seen.  IMPRESSION:  1.  Suspect left lower lobe pneumonia.  Recommend follow-up. 2.  Probable hiatal hernia.  Original Report Authenticated By: Juline Patch, M.D.    EKG: Independently reviewed.   Assessment/Plan Principal Problem:  *Left lower lobe pneumonia Active Problems:  UTI (lower urinary tract infection)  Nausea and vomiting  LLQ abdominal pain  Acute renal failure  Hyponatremia   Left lower lobe pneumonia Patient started on erythromycin and Rocephin by the ED physician, I will continue on these medications. I will provide also supportive management with mucolytics, inhaled bronchodilators as needed and supplemental oxygen.  UTI Patient is on Rocephin for pneumonia, this is probably is going to cover for the UTI. Blood cultures done.  LLQ abdominal pain With history of diverticulitis, patient still have tenderness in the LLQ, she denies diarrhea. Because of the findings of the pneumonia and the UTI can explain the nausea/vomiting and abdominal pain  no further workup done in the ED. I highly recommend to obtain CT scan of abdomen and pelvis to rule out diverticulitis when creatinine improves to less than 1.5.  Acute renal failure  Patient has creatinine of 1.6 there is no recent baseline, I will aggressively hydrate patient with IV fluids and check BMP in the morning.  Hyponatremia This is likely secondary to dehydration and acute renal failure. Patient will receive normal saline and we'll check BMP in the morning.  Code Status: Full code Family Communication: Her son Alinda Money was at bedside and the plan explained to  him Disposition Plan: Med surge bed  Time spent: 60 minutes  [facility] Pager [phone]  If 7PM-7AM, please contact night-coverage [email or web address] Password [facility] 04/05/2012, 8:22 PM

## 2012-04-05 NOTE — ED Notes (Signed)
Pt began having abd pain last night.  Pain was first centralized.  At midnight pt began having dizziness, dry heaves and frequent diarrhea.  Pain began to move to L side.  Pt states she also has some lower abd swelling and pain.  Pt came by EMS from urgent care.  IV started at urgent care.  Labs showed WBC 20.9.  Brought here to rule out diverticulitis.

## 2012-04-05 NOTE — ED Notes (Signed)
MD at bedside. Dr. McMannus at bedside.  

## 2012-04-06 LAB — COMPREHENSIVE METABOLIC PANEL
ALT: 13 U/L (ref 0–35)
AST: 16 U/L (ref 0–37)
Albumin: 2.7 g/dL — ABNORMAL LOW (ref 3.5–5.2)
Alkaline Phosphatase: 54 U/L (ref 39–117)
BUN: 24 mg/dL — ABNORMAL HIGH (ref 6–23)
CO2: 24 mEq/L (ref 19–32)
Calcium: 7.9 mg/dL — ABNORMAL LOW (ref 8.4–10.5)
Chloride: 98 mEq/L (ref 96–112)
Creatinine, Ser: 1.54 mg/dL — ABNORMAL HIGH (ref 0.50–1.10)
GFR calc Af Amer: 37 mL/min — ABNORMAL LOW (ref >90)
GFR calc non Af Amer: 32 mL/min — ABNORMAL LOW (ref >90)
Glucose, Bld: 104 mg/dL — ABNORMAL HIGH (ref 70–99)
Potassium: 3.6 mEq/L (ref 3.5–5.1)
Sodium: 130 mEq/L — ABNORMAL LOW (ref 135–145)
Total Bilirubin: 0.4 mg/dL (ref 0.3–1.2)
Total Protein: 5.6 g/dL — ABNORMAL LOW (ref 6.0–8.3)

## 2012-04-06 LAB — CBC
HCT: 30.2 % — ABNORMAL LOW (ref 36.0–46.0)
Hemoglobin: 10.3 g/dL — ABNORMAL LOW (ref 12.0–15.0)
MCH: 30.9 pg (ref 26.0–34.0)
MCHC: 34.1 g/dL (ref 30.0–36.0)
MCV: 90.7 fL (ref 78.0–100.0)
Platelets: 185 10*3/uL (ref 150–400)
RBC: 3.33 MIL/uL — ABNORMAL LOW (ref 3.87–5.11)
RDW: 14 % (ref 11.5–15.5)
WBC: 14.7 10*3/uL — ABNORMAL HIGH (ref 4.0–10.5)

## 2012-04-06 LAB — STREP PNEUMONIAE URINARY ANTIGEN: Strep Pneumo Urinary Antigen: NEGATIVE

## 2012-04-06 MED ORDER — ALBUTEROL SULFATE (5 MG/ML) 0.5% IN NEBU
2.5000 mg | INHALATION_SOLUTION | Freq: Three times a day (TID) | RESPIRATORY_TRACT | Status: DC
  Administered 2012-04-06 – 2012-04-10 (×11): 2.5 mg via RESPIRATORY_TRACT
  Filled 2012-04-06 (×12): qty 0.5

## 2012-04-06 MED ORDER — ACETAMINOPHEN 325 MG PO TABS
650.0000 mg | ORAL_TABLET | Freq: Four times a day (QID) | ORAL | Status: DC | PRN
Start: 2012-04-06 — End: 2012-04-12
  Administered 2012-04-06 – 2012-04-10 (×5): 650 mg via ORAL
  Filled 2012-04-06 (×8): qty 2

## 2012-04-06 MED ORDER — LEVOTHYROXINE SODIUM 175 MCG PO TABS
175.0000 ug | ORAL_TABLET | ORAL | Status: DC
  Filled 2012-04-06 (×3): qty 1

## 2012-04-06 MED ORDER — LEVOTHYROXINE SODIUM 200 MCG PO TABS
200.0000 ug | ORAL_TABLET | ORAL | Status: DC
  Administered 2012-04-06 – 2012-04-09 (×2): 200 ug via ORAL
  Filled 2012-04-06 (×3): qty 1

## 2012-04-06 MED ORDER — IPRATROPIUM BROMIDE 0.02 % IN SOLN
0.5000 mg | Freq: Three times a day (TID) | RESPIRATORY_TRACT | Status: DC
  Administered 2012-04-06 – 2012-04-10 (×11): 0.5 mg via RESPIRATORY_TRACT
  Filled 2012-04-06 (×12): qty 2.5

## 2012-04-06 NOTE — Progress Notes (Signed)
Results of Culture results text paged to Dr. Suanne Marker.  Philomena Doheny RN

## 2012-04-06 NOTE — Progress Notes (Signed)
Patient having chills.  Temp. Checked 98.0.  Dr. Suanne Marker notified.  Philomena Doheny RN

## 2012-04-06 NOTE — Progress Notes (Signed)
[facility] PROGRESS NOTE  Abigail [name] ZOX:[id number] DOB: [birth year] DOA: 04/05/2012 PCP: Alva Garnet., MD  Assessment/Plan: Principal Problem:  *Left lower lobe pneumonia Active Problems:  UTI (lower urinary tract infection)  Nausea and vomiting  LLQ abdominal pain  Acute renal failure  Hyponatremia Left lower lobe pneumonia  -continue azithromycin and Rocephin follow cultures and further treat accordingly, wbc improving/trending down -also continue mucolytics, inhaled bronchodilators as needed and supplemental oxygen.  UTI  -continue Rocephin follow culture as above and further treat accordingly  LLQ abdominal pain  With history of diverticulitis, patient had tenderness in the LLQ per admitting MD, she denied diarrhea.  -Pt has no abdominal pain today, and no LLQ tenderness on exam, also no n/v - will therefore hold off CT at this time (also cr still elevated) Acute renal failure  Patient has creatinine of 1.6 on admission, there is no recent baseline -Cr trending down, continue hydration h IV fluids follow and check BMP in the morning.  Hyponatremia  -slight improvement with hydration, follow and recheck in am -vol depletion vs SIADH     Code Status: Full Family Communication: no family available Disposition Plan:home when medically stable   Brief narrative: Pt is a58 year old female with past medical history of hypertension, hypothyroidism and diverticulitis. Patient went to urgent care today because of nausea, vomiting abdominal pain and left chest soreness. Chest xray LLL PNA, and labs with wbc 18.7 and NA 129. Pt admitted for further evaluation and management.   Consultants: none  Procedures:  none  Antibiotics:  Rocephin and zithromax -started 8/12  HPI/Subjective: Pt sitting up in chair, states she just feels achy abd tired. She denies cough, and pain and no n/v  Objective: Filed Vitals:   04/05/12 2047 04/05/12 2220 04/06/12 0539  04/06/12 0817  BP: 132/62  111/65   Pulse: 102  85   Temp: 98.9 F (37.2 C)  100.5 F (38.1 C)   TempSrc: Oral  Oral   Resp: 18  16   Height: 5\' 6"  (1.676 m)     Weight: 76.8 kg (169 lb 5 oz)     SpO2: 94% 95% 91% 90%    Intake/Output Summary (Last 24 hours) at 04/06/12 1143 Last data filed at 04/06/12 0905  Gross per 24 hour  Intake    240 ml  Output      0 ml  Net    240 ml   Filed Weights   04/05/12 2047  Weight: 76.8 kg (169 lb 5 oz)    Exam:   General:  Alert, orientedx3, in NAD  Cardiovascular:RRR  Respiratory: decreased BS at bases, no crackles, no wheezes  Abdomen: soft, +BS, NT/ND  EXT: no cyanosis, no edema  Data Reviewed: Basic Metabolic Panel:  Lab 04/06/12 8119 04/05/12 1410  NA 130* 129*  K 3.6 4.4  CL 98 94*  CO2 24 21  GLUCOSE 104* 124*  BUN 24* 30*  CREATININE 1.54* 1.60*  CALCIUM 7.9* 8.9  MG -- --  PHOS -- --   Liver Function Tests:  Lab 04/06/12 0350 04/05/12 1410  AST 16 27  ALT 13 16  ALKPHOS 54 63  BILITOT 0.4 0.4  PROT 5.6* 7.2  ALBUMIN 2.7* 3.8    Lab 04/05/12 1410  LIPASE 26  AMYLASE --   No results found for this basename: AMMONIA:5 in the last 168 hours CBC:  Lab 04/06/12 0350 04/05/12 1520 04/05/12 1212  WBC 14.7* 18.7* 20.9*  NEUTROABS -- 16.9* --  HGB  10.3* 11.6* 13.1  HCT 30.2* 33.9* 42.0  MCV 90.7 90.2 95.3  PLT 185 232 --   Cardiac Enzymes: No results found for this basename: CKTOTAL:5,CKMB:5,CKMBINDEX:5,TROPONINI:5 in the last 168 hours BNP (last 3 results) No results found for this basename: PROBNP:3 in the last 8760 hours CBG: No results found for this basename: GLUCAP:5 in the last 168 hours  Recent Results (from the past 240 hour(s))  CULTURE, BLOOD (ROUTINE X 2)     Status: Normal (Preliminary result)   Collection Time   04/05/12  7:30 PM      Component Value Range Status Comment   Specimen Description BLOOD LEFT ARM   Final    Special Requests BOTTLES DRAWN AEROBIC AND ANAEROBIC 2 CC  EACH   Final    Culture  Setup Time 04/05/2012 23:33   Final    Culture     Final    Value:        BLOOD CULTURE RECEIVED NO GROWTH TO DATE CULTURE WILL BE HELD FOR 5 DAYS BEFORE ISSUING A FINAL NEGATIVE REPORT   Report Status PENDING   Incomplete   CULTURE, BLOOD (ROUTINE X 2)     Status: Normal (Preliminary result)   Collection Time   04/05/12  7:35 PM      Component Value Range Status Comment   Specimen Description BLOOD LEFT AC   Final    Special Requests BOTTLES DRAWN AEROBIC AND ANAEROBIC 3 CC EACH   Final    Culture  Setup Time 04/05/2012 23:33   Final    Culture     Final    Value:        BLOOD CULTURE RECEIVED NO GROWTH TO DATE CULTURE WILL BE HELD FOR 5 DAYS BEFORE ISSUING A FINAL NEGATIVE REPORT   Report Status PENDING   Incomplete      Studies: Dg Chest 2 View  04/05/2012  *RADIOLOGY REPORT*  Clinical Data: Shortness of breath, weakness, former smoking history  CHEST - 2 VIEW  Comparison: None.  Findings: There is patchy parenchymal opacity in the left lower lobe most consistent with pneumonia.  A few air-fluid levels in the retrocardiac region on the lateral view probably represent a hiatal hernia.  The right lung is clear.  There is mild peribronchial thickening present.  The heart is mildly enlarged.  No bony abnormality is seen.  IMPRESSION:  1.  Suspect left lower lobe pneumonia.  Recommend follow-up. 2.  Probable hiatal hernia.  Original Report Authenticated By: Juline Patch, M.D.    Scheduled Meds:   . albuterol  2.5 mg Nebulization QID  . aspirin EC  81 mg Oral Daily  . azithromycin  500 mg Intravenous Once  . azithromycin  500 mg Oral Q24H  . cefTRIAXone (ROCEPHIN)  IV  1 g Intravenous Once  . cefTRIAXone (ROCEPHIN)  IV  1 g Intravenous Q24H  . cholecalciferol  1,000 Units Oral Daily  . diltiazem  180 mg Oral Daily  . guaiFENesin  600 mg Oral BID  . ipratropium  0.5 mg Nebulization QID  . levothyroxine  175 mcg Oral QAC breakfast  . levothyroxine  175 mcg Oral  Q48H  . levothyroxine  200 mcg Oral Q48H  . lisinopril  10 mg Oral Daily  . ondansetron  4 mg Intravenous Once  . pravastatin  40 mg Oral Daily  . sodium chloride  1,000 mL Intravenous Once  . DISCONTD: aspirin  81 mg Oral Daily  . DISCONTD: diltiazem  180 mg  Oral Daily  . DISCONTD: levothyroxine  175 mcg Oral Daily  . DISCONTD: levothyroxine  200 mcg Oral Daily  . DISCONTD: levothyroxine  200 mcg Oral QAC breakfast  . DISCONTD: lisinopril  10 mg Oral Daily  . DISCONTD: simvastatin  20 mg Oral q1800   Continuous Infusions:   . sodium chloride 125 mL/hr at 04/06/12 1041    Principal Problem:  *Left lower lobe pneumonia Active Problems:  UTI (lower urinary tract infection)  Nausea and vomiting  LLQ abdominal pain  Acute renal failure  Hyponatremia    Time spent:    [facility] C  [facility] Pager [phone]. If 8PM-8AM, please contact night-coverage at [email or web address], password [facility] 04/06/2012, 11:43 AM  LOS: 1 day

## 2012-04-07 DIAGNOSIS — R7881 Bacteremia: Secondary | ICD-10-CM

## 2012-04-07 DIAGNOSIS — R1032 Left lower quadrant pain: Secondary | ICD-10-CM

## 2012-04-07 LAB — URINE CULTURE: Colony Count: >=100000

## 2012-04-07 LAB — BASIC METABOLIC PANEL
BUN: 20 mg/dL (ref 6–23)
CO2: 22 mEq/L (ref 19–32)
Calcium: 7.7 mg/dL — ABNORMAL LOW (ref 8.4–10.5)
Chloride: 102 mEq/L (ref 96–112)
Creatinine, Ser: 1.68 mg/dL — ABNORMAL HIGH (ref 0.50–1.10)
GFR calc Af Amer: 33 mL/min — ABNORMAL LOW (ref >90)
GFR calc non Af Amer: 29 mL/min — ABNORMAL LOW (ref >90)
Glucose, Bld: 181 mg/dL — ABNORMAL HIGH (ref 70–99)
Potassium: 3.7 mEq/L (ref 3.5–5.1)
Sodium: 134 mEq/L — ABNORMAL LOW (ref 135–145)

## 2012-04-07 LAB — LEGIONELLA ANTIGEN, URINE: Legionella Antigen, Urine: NEGATIVE

## 2012-04-07 MED ORDER — LORAZEPAM 0.5 MG PO TABS
0.5000 mg | ORAL_TABLET | Freq: Once | ORAL | Status: AC
  Administered 2012-04-07: 0.5 mg via ORAL
  Filled 2012-04-07: qty 1

## 2012-04-07 NOTE — Progress Notes (Addendum)
[facility] PROGRESS NOTE  Abigail [name] ZOX:[id number] DOB: 1936/01/28 DOA: 04/05/2012 PCP: Alva Garnet., MD  Assessment/Plan: Principal Problem:  *Left lower lobe pneumonia Active Problems:  UTI (lower urinary tract infection)  Nausea and vomiting  LLQ abdominal pain  Acute renal failure  Hyponatremia Left lower lobe pneumonia  -continue azithromycin and Rocephin cultures ID and sens  further treat accordingly, -fever curve mproving/trending down -also continue mucolytics, inhaled bronchodilators as needed and supplemental oxygen.  UTI, GNR -continue Rocephin follow culture  ID and sens as above and further adjust abx appropriate GNR bacteremia -2/2 above, continue current abx pending cultures, fever curve trending down, follow and recheck wbc LLQ abdominal pain  has history of diverticulitis, and again with c/o LLQ pain, and mild tenderness on exam -  cr still elevated, follow and obtain CT to further eval when renal function improves Acute renal failure  Patient has creatinine of 1.6 on admission, there is no recent baseline -Cr 1.68 today,continue hydration h IV fluids follow and check BMP in the morning.  Hyponatremia  -improved with hydration, follow and recheck in am -more likely 2/2 vol. depletion     Code Status: Full Family Communication: no family available Disposition Plan:home when medically stable   Brief narrative: Pt is a47 year old female with past medical history of hypertension, hypothyroidism and diverticulitis. Patient went to urgent care today because of nausea, vomiting abdominal pain and left chest soreness. Chest xray LLL PNA, and labs with wbc 18.7 and NA 129. Pt admitted for further evaluation and management.   Consultants: none  Procedures:  none  Antibiotics:  Rocephin and zithromax -started 8/12  HPI/Subjective: c/o LLQ soreness, some nausea with dry heaves this am Objective: Filed Vitals:   04/06/12 1440 04/06/12  2000 04/06/12 2050 04/07/12 0545  BP:   138/62 132/69  Pulse:   107 84  Temp:   99.6 F (37.6 C) 98.4 F (36.9 C)  TempSrc:   Oral Oral  Resp:   18 18  Height:      Weight:      SpO2: 91% 93% 91% 90%    Intake/Output Summary (Last 24 hours) at 04/07/12 1016 Last data filed at 04/07/12 8119  Gross per 24 hour  Intake   4511 ml  Output    550 ml  Net   3961 ml   Filed Weights   04/05/12 2047  Weight: 76.8 kg (169 lb 5 oz)    Exam:   General:  Alert, orientedx3, in NAD  Cardiovascular:RRR  Respiratory: decreased BS at bases, no crackles, no wheezes  Abdomen: soft, +BS, NT, mild LLQ tenderness  EXT: no cyanosis, no edema  Data Reviewed: Basic Metabolic Panel:  Lab 04/06/12 1478 04/05/12 1410  NA 130* 129*  K 3.6 4.4  CL 98 94*  CO2 24 21  GLUCOSE 104* 124*  BUN 24* 30*  CREATININE 1.54* 1.60*  CALCIUM 7.9* 8.9  MG -- --  PHOS -- --   Liver Function Tests:  Lab 04/06/12 0350 04/05/12 1410  AST 16 27  ALT 13 16  ALKPHOS 54 63  BILITOT 0.4 0.4  PROT 5.6* 7.2  ALBUMIN 2.7* 3.8    Lab 04/05/12 1410  LIPASE 26  AMYLASE --   No results found for this basename: AMMONIA:5 in the last 168 hours CBC:  Lab 04/06/12 0350 04/05/12 1520 04/05/12 1212  WBC 14.7* 18.7* 20.9*  NEUTROABS -- 16.9* --  HGB 10.3* 11.6* 13.1  HCT 30.2* 33.9* 42.0  MCV 90.7  90.2 95.3  PLT 185 232 --   Cardiac Enzymes: No results found for this basename: CKTOTAL:5,CKMB:5,CKMBINDEX:5,TROPONINI:5 in the last 168 hours BNP (last 3 results) No results found for this basename: PROBNP:3 in the last 8760 hours CBG: No results found for this basename: GLUCAP:5 in the last 168 hours  Recent Results (from the past 240 hour(s))  URINE CULTURE     Status: Normal (Preliminary result)   Collection Time   04/05/12  4:06 PM      Component Value Range Status Comment   Specimen Description URINE, CLEAN CATCH   Final    Special Requests NONE   Final    Culture  Setup Time 04/06/2012 00:30    Final    Colony Count >=100,000 COLONIES/ML   Final    Culture GRAM NEGATIVE RODS   Final    Report Status PENDING   Incomplete   CULTURE, BLOOD (ROUTINE X 2)     Status: Normal (Preliminary result)   Collection Time   04/05/12  7:30 PM      Component Value Range Status Comment   Specimen Description BLOOD LEFT ARM   Final    Special Requests BOTTLES DRAWN AEROBIC AND ANAEROBIC 2 CC EACH   Final    Culture  Setup Time 04/05/2012 23:33   Final    Culture     Final    Value:        BLOOD CULTURE RECEIVED NO GROWTH TO DATE CULTURE WILL BE HELD FOR 5 DAYS BEFORE ISSUING A FINAL NEGATIVE REPORT   Report Status PENDING   Incomplete   CULTURE, BLOOD (ROUTINE X 2)     Status: Normal (Preliminary result)   Collection Time   04/05/12  7:35 PM      Component Value Range Status Comment   Specimen Description BLOOD LEFT AC   Final    Special Requests BOTTLES DRAWN AEROBIC AND ANAEROBIC 3 CC EACH   Final    Culture  Setup Time 04/05/2012 23:33   Final    Culture     Final    Value: GRAM NEGATIVE RODS     Note: Gram Stain Report Called to,Read Back By and Verified WithRoney Mans @ 1232 04/06/12 BY KRAWS   Report Status PENDING   Incomplete      Studies: Dg Chest 2 View  04/05/2012  *RADIOLOGY REPORT*  Clinical Data: Shortness of breath, weakness, former smoking history  CHEST - 2 VIEW  Comparison: None.  Findings: There is patchy parenchymal opacity in the left lower lobe most consistent with pneumonia.  A few air-fluid levels in the retrocardiac region on the lateral view probably represent a hiatal hernia.  The right lung is clear.  There is mild peribronchial thickening present.  The heart is mildly enlarged.  No bony abnormality is seen.  IMPRESSION:  1.  Suspect left lower lobe pneumonia.  Recommend follow-up. 2.  Probable hiatal hernia.  Original Report Authenticated By: Juline Patch, M.D.    Scheduled Meds:    . albuterol  2.5 mg Nebulization TID  . aspirin EC  81 mg Oral Daily  .  azithromycin  500 mg Oral Q24H  . cefTRIAXone (ROCEPHIN)  IV  1 g Intravenous Q24H  . cholecalciferol  1,000 Units Oral Daily  . diltiazem  180 mg Oral Daily  . guaiFENesin  600 mg Oral BID  . ipratropium  0.5 mg Nebulization TID  . levothyroxine  175 mcg Oral Q48H  . levothyroxine  200  mcg Oral Q48H  . lisinopril  10 mg Oral Daily  . pravastatin  40 mg Oral Daily  . DISCONTD: albuterol  2.5 mg Nebulization QID  . DISCONTD: ipratropium  0.5 mg Nebulization QID  . DISCONTD: levothyroxine  175 mcg Oral QAC breakfast   Continuous Infusions:    . sodium chloride 125 mL/hr at 04/07/12 0620    Principal Problem:  *Left lower lobe pneumonia Active Problems:  UTI (lower urinary tract infection)  Nausea and vomiting  LLQ abdominal pain  Acute renal failure  Hyponatremia    Time spent:    [facility] C  [facility] Pager [phone]. If 8PM-8AM, please contact night-coverage at [email or web address], password [facility] 04/07/2012, 10:16 AM  LOS: 2 days

## 2012-04-07 NOTE — Progress Notes (Signed)
1350 pt's axillary temperature was 100. 1400 encouraged and observed pt using incentive spirometer. Sent pt's update in status to Dr. Suanne Marker in personal message. No new orders received from Dr. Suanne Marker. Continuing to monitor pt and encourage use of spirometer.

## 2012-04-08 DIAGNOSIS — R7881 Bacteremia: Secondary | ICD-10-CM

## 2012-04-08 DIAGNOSIS — R112 Nausea with vomiting, unspecified: Secondary | ICD-10-CM

## 2012-04-08 HISTORY — DX: Bacteremia: R78.81

## 2012-04-08 LAB — BASIC METABOLIC PANEL
BUN: 16 mg/dL (ref 6–23)
CO2: 21 mEq/L (ref 19–32)
Calcium: 7.5 mg/dL — ABNORMAL LOW (ref 8.4–10.5)
Chloride: 102 mEq/L (ref 96–112)
Creatinine, Ser: 1.61 mg/dL — ABNORMAL HIGH (ref 0.50–1.10)
GFR calc Af Amer: 35 mL/min — ABNORMAL LOW (ref >90)
GFR calc non Af Amer: 30 mL/min — ABNORMAL LOW (ref >90)
Glucose, Bld: 97 mg/dL (ref 70–99)
Potassium: 3.8 mEq/L (ref 3.5–5.1)
Sodium: 132 mEq/L — ABNORMAL LOW (ref 135–145)

## 2012-04-08 LAB — CULTURE, BLOOD (ROUTINE X 2)

## 2012-04-08 LAB — CBC
HCT: 27.7 % — ABNORMAL LOW (ref 36.0–46.0)
Hemoglobin: 9.5 g/dL — ABNORMAL LOW (ref 12.0–15.0)
MCH: 31.5 pg (ref 26.0–34.0)
MCHC: 34.3 g/dL (ref 30.0–36.0)
MCV: 91.7 fL (ref 78.0–100.0)
Platelets: 163 10*3/uL (ref 150–400)
RBC: 3.02 MIL/uL — ABNORMAL LOW (ref 3.87–5.11)
RDW: 13.8 % (ref 11.5–15.5)
WBC: 9.8 10*3/uL (ref 4.0–10.5)

## 2012-04-08 MED ORDER — CIPROFLOXACIN HCL 500 MG PO TABS
500.0000 mg | ORAL_TABLET | Freq: Two times a day (BID) | ORAL | Status: DC
  Administered 2012-04-08 – 2012-04-12 (×9): 500 mg via ORAL
  Filled 2012-04-08 (×11): qty 1

## 2012-04-08 MED ORDER — ONDANSETRON HCL 4 MG/2ML IJ SOLN
4.0000 mg | Freq: Three times a day (TID) | INTRAMUSCULAR | Status: DC | PRN
  Administered 2012-04-08: 4 mg via INTRAVENOUS
  Filled 2012-04-08: qty 2

## 2012-04-08 MED ORDER — LORAZEPAM 0.5 MG PO TABS
0.5000 mg | ORAL_TABLET | Freq: Every day | ORAL | Status: DC
  Administered 2012-04-08: 0.5 mg via ORAL
  Filled 2012-04-08: qty 1

## 2012-04-08 MED ORDER — AMOXICILLIN-POT CLAVULANATE 500-125 MG PO TABS
1.0000 | ORAL_TABLET | Freq: Two times a day (BID) | ORAL | Status: DC
  Administered 2012-04-08 – 2012-04-12 (×9): 500 mg via ORAL
  Filled 2012-04-08 (×10): qty 1

## 2012-04-08 NOTE — Plan of Care (Signed)
Problem: Phase III Progression Outcomes Goal: Convert IV antibiotics to PO Outcome: Progressing Nausea post administration

## 2012-04-08 NOTE — Progress Notes (Signed)
[facility] PROGRESS NOTE  Abigail [name] NWG:[id number] DOB: [birth year] DOA: 04/05/2012 PCP: Alva Garnet., MD  Assessment/Plan: Principal Problem:  *Left lower lobe pneumonia Active Problems:  UTI (lower urinary tract infection)  Nausea and vomiting  LLQ abdominal pain  Acute renal failure  Hyponatremia  Bacteremia  Left lower lobe pneumonia  -Will cover with augmentin (patient lives at home) - Wean off of supplemental oxygen as tolerated -fever curve improving/trending down  -also continue mucolytics and inhaled bronchodilators as needed..   UTI, GNR  -E Coli which is sensitive to Ciprofloxacin.  Will transition to oral cipro today. - Monitor WBC count and fever curve.  Reportedly had recent axillary temp of 100 recently.  Overall improving currently.  GNR bacteremia  -2/2 above and grew out E coli.  Patient will need 14 total course of antibiotics once transitioned home.  Will place on cipro today.  LLQ abdominal pain  - Cr still elevated today despite IVF's - Today not complaining of LLQ discomfort.  Given sensitivities will place patient on augmentin and ciprofloxacin which would cover for diverticulitis but more importantly covers her for her confirmed bacteremia/UTI/PNA. - Will Defer CT of abdomen and pelvis today given resolution of her LLQ abdominal discomfort.  Acute renal failure  Patient has creatinine of 1.6 on admission, there is no recent baseline  -Cr 1.61 today,continue hydration h IV fluids follow and check BMP in the morning.   Hyponatremia  - Probably due to poor oral solute intake. - Will continue IVF rehydration but decrease rate today and encourage po intake.   - Will add po supplement today.  Code Status: Full  Family Communication: no family available  Disposition Plan: Once patient's po intake improves and with continued improvement on current oral antibiotic regimen.  Will require 14 days of abx treatment as outpatient for her  bacteremia.  Brief narrative:  Pt is Abigail [name] with past medical history of hypertension, hypothyroidism and diverticulitis. Patient went to urgent care today because of nausea, vomiting abdominal pain and left chest soreness. Chest xray LLL PNA, and labs with wbc 18.7 and NA 129. Pt admitted for further evaluation and management.   Consultants:  none  Procedures:  none Antibiotics:  Rocephin and zithromax -started 8/12   HPI/Subjective: Pt reports that her oral intake today has not been great.  Also denies any lower abdominal discomfort today.  No nausea or dry heaves.  No acute issues overnight.  Objective: Filed Vitals:   04/07/12 2025 04/07/12 2131 04/08/12 0603 04/08/12 0909  BP:  136/77 128/68   Pulse:  83 73   Temp:  100 F (37.8 C) 98.4 F (36.9 C)   TempSrc:  Oral Oral   Resp:  18 16   Height:      Weight:      SpO2: 94% 90% 93% 94%    Intake/Output Summary (Last 24 hours) at 04/08/12 1053 Last data filed at 04/08/12 0900  Gross per 24 hour  Intake 4881.33 ml  Output   1401 ml  Net 3480.33 ml   Filed Weights   04/05/12 2047  Weight: 76.8 kg (169 lb 5 oz)    Exam:   General:  Pt in NAD, sitting up in chair, Alert and Smiling  Cardiovascular: RRR, No MRG  Respiratory: No wheezes, no increased work of breathing.  Abdomen: Soft, NT, ND.  Data Reviewed: Basic Metabolic Panel:  Lab 04/08/12 5784 04/07/12 1045 04/06/12 0350 04/05/12 1410  NA 132* 134* 130* 129*  K  3.8 3.7 3.6 4.4  CL 102 102 98 94*  CO2 21 22 24 21   GLUCOSE 97 181* 104* 124*  BUN 16 20 24* 30*  CREATININE 1.61* 1.68* 1.54* 1.60*  CALCIUM 7.5* 7.7* 7.9* 8.9  MG -- -- -- --  PHOS -- -- -- --   Liver Function Tests:  Lab 04/06/12 0350 04/05/12 1410  AST 16 27  ALT 13 16  ALKPHOS 54 63  BILITOT 0.4 0.4  PROT 5.6* 7.2  ALBUMIN 2.7* 3.8    Lab 04/05/12 1410  LIPASE 26  AMYLASE --   No results found for this basename: AMMONIA:5 in the last 168 hours CBC:  Lab  04/08/12 0322 04/06/12 0350 04/05/12 1520 04/05/12 1212  WBC 9.8 14.7* 18.7* 20.9*  NEUTROABS -- -- 16.9* --  HGB 9.5* 10.3* 11.6* 13.1  HCT 27.7* 30.2* 33.9* 42.0  MCV 91.7 90.7 90.2 95.3  PLT 163 185 232 --   Cardiac Enzymes: No results found for this basename: CKTOTAL:5,CKMB:5,CKMBINDEX:5,TROPONINI:5 in the last 168 hours BNP (last 3 results) No results found for this basename: PROBNP:3 in the last 8760 hours CBG: No results found for this basename: GLUCAP:5 in the last 168 hours  Recent Results (from the past 240 hour(s))  URINE CULTURE     Status: Normal   Collection Time   04/05/12  4:06 PM      Component Value Range Status Comment   Specimen Description URINE, CLEAN CATCH   Final    Special Requests NONE   Final    Culture  Setup Time 04/06/2012 00:30   Final    Colony Count >=100,000 COLONIES/ML   Final    Culture ESCHERICHIA COLI   Final    Report Status 04/07/2012 FINAL   Final    Organism ID, Bacteria ESCHERICHIA COLI   Final   CULTURE, BLOOD (ROUTINE X 2)     Status: Normal (Preliminary result)   Collection Time   04/05/12  7:30 PM      Component Value Range Status Comment   Specimen Description BLOOD LEFT ARM   Final    Special Requests BOTTLES DRAWN AEROBIC AND ANAEROBIC 2 CC EACH   Final    Culture  Setup Time 04/05/2012 23:33   Final    Culture     Final    Value:        BLOOD CULTURE RECEIVED NO GROWTH TO DATE CULTURE WILL BE HELD FOR 5 DAYS BEFORE ISSUING A FINAL NEGATIVE REPORT   Report Status PENDING   Incomplete   CULTURE, BLOOD (ROUTINE X 2)     Status: Normal   Collection Time   04/05/12  7:35 PM      Component Value Range Status Comment   Specimen Description BLOOD LEFT AC   Final    Special Requests BOTTLES DRAWN AEROBIC AND ANAEROBIC 3 CC EACH   Final    Culture  Setup Time 04/05/2012 23:33   Final    Culture     Final    Value: ESCHERICHIA COLI     Note: Gram Stain Report Called to,Read Back By and Verified WithRoney Mans @ 1232 04/06/12 BY  KRAWS   Report Status 04/08/2012 FINAL   Final    Organism ID, Bacteria ESCHERICHIA COLI   Final      Studies: Dg Chest 2 View  04/05/2012  *RADIOLOGY REPORT*  Clinical Data: Shortness of breath, weakness, former smoking history  CHEST - 2 VIEW  Comparison: None.  Findings: There  is patchy parenchymal opacity in the left lower lobe most consistent with pneumonia.  A few air-fluid levels in the retrocardiac region on the lateral view probably represent a hiatal hernia.  The right lung is clear.  There is mild peribronchial thickening present.  The heart is mildly enlarged.  No bony abnormality is seen.  IMPRESSION:  1.  Suspect left lower lobe pneumonia.  Recommend follow-up. 2.  Probable hiatal hernia.  Original Report Authenticated By: Juline Patch, M.D.    Scheduled Meds:   . albuterol  2.5 mg Nebulization TID  . amoxicillin-clavulanate  1 tablet Oral BID  . aspirin EC  81 mg Oral Daily  . cholecalciferol  1,000 Units Oral Daily  . ciprofloxacin  500 mg Oral BID  . diltiazem  180 mg Oral Daily  . guaiFENesin  600 mg Oral BID  . ipratropium  0.5 mg Nebulization TID  . levothyroxine  175 mcg Oral Q48H  . levothyroxine  200 mcg Oral Q48H  . lisinopril  10 mg Oral Daily  . LORazepam  0.5 mg Oral Once  . pravastatin  40 mg Oral Daily  . DISCONTD: azithromycin  500 mg Oral Q24H  . DISCONTD: cefTRIAXone (ROCEPHIN)  IV  1 g Intravenous Q24H   Continuous Infusions:   . sodium chloride 125 mL/hr at 04/08/12 6578    Principal Problem:  *Left lower lobe pneumonia Active Problems:  UTI (lower urinary tract infection)  Nausea and vomiting  LLQ abdominal pain  Acute renal failure  Hyponatremia  Bacteremia    Time spent: > 35 minutes    Penny Pia  [facility] Pager [phone]. If 8PM-8AM, please contact night-coverage at [email or web address], password [facility] 04/08/2012, 10:53 AM  LOS: 3 days

## 2012-04-09 LAB — BASIC METABOLIC PANEL
BUN: 14 mg/dL (ref 6–23)
CO2: 23 mEq/L (ref 19–32)
Calcium: 7.8 mg/dL — ABNORMAL LOW (ref 8.4–10.5)
Chloride: 101 mEq/L (ref 96–112)
Creatinine, Ser: 1.46 mg/dL — ABNORMAL HIGH (ref 0.50–1.10)
GFR calc Af Amer: 40 mL/min — ABNORMAL LOW (ref >90)
GFR calc non Af Amer: 34 mL/min — ABNORMAL LOW (ref >90)
Glucose, Bld: 111 mg/dL — ABNORMAL HIGH (ref 70–99)
Potassium: 3.8 mEq/L (ref 3.5–5.1)
Sodium: 132 mEq/L — ABNORMAL LOW (ref 135–145)

## 2012-04-09 MED ORDER — ALPRAZOLAM 0.25 MG PO TABS
0.2500 mg | ORAL_TABLET | Freq: Three times a day (TID) | ORAL | Status: DC | PRN
  Administered 2012-04-09 – 2012-04-12 (×5): 0.25 mg via ORAL
  Filled 2012-04-09 (×5): qty 1

## 2012-04-09 MED ORDER — PREDNISONE 20 MG PO TABS
40.0000 mg | ORAL_TABLET | Freq: Every day | ORAL | Status: DC
  Administered 2012-04-09 – 2012-04-12 (×4): 40 mg via ORAL
  Filled 2012-04-09 (×5): qty 2

## 2012-04-09 MED ORDER — ALPRAZOLAM 0.25 MG PO TABS: 0.2500 mg | ORAL_TABLET | Freq: Every evening | ORAL | Status: DC | PRN

## 2012-04-09 MED ORDER — BUSPIRONE HCL 5 MG PO TABS
5.0000 mg | ORAL_TABLET | Freq: Two times a day (BID) | ORAL | Status: DC
  Administered 2012-04-09 – 2012-04-12 (×7): 5 mg via ORAL
  Filled 2012-04-09 (×8): qty 1

## 2012-04-09 NOTE — Progress Notes (Addendum)
[facility] PROGRESS NOTE  Abigail [name] WUJ:[id number] DOB: 04-Jun-1936 DOA: 04/05/2012 PCP: Alva Garnet., MD  Assessment/Plan: Principal Problem:  *Left lower lobe pneumonia Active Problems:  UTI (lower urinary tract infection)  Nausea and vomiting  LLQ abdominal pain  Acute renal failure  Hyponatremia  Bacteremia  Left lower lobe pneumonia  -Will cover with augmentin (patient lives at home)  - Wean off of supplemental oxygen as tolerated  -steroids for wheezing -fever curve improving/trending down  -also continue mucolytics and inhaled bronchodilators as needed..   UTI, GNR  -E Coli which is sensitive to Ciprofloxacin. Will transition to oral cipro today.  - Monitor WBC count and fever curve. Reportedly had recent axillary temp of 100 recently. Overall improving currently .  GNR bacteremia  -2/2 above and grew out E coli. Patient will need 14 total course of antibiotics once transitioned home. Will place on cipro 8/15 .  LLQ abdominal pain  - Cr still elevated today despite IVF's  - Today not complaining of LLQ discomfort. Given sensitivities will place patient on augmentin and ciprofloxacin which would cover for diverticulitis but more importantly covers her for her confirmed bacteremia/UTI/PNA.  -eating well   Acute renal failure  Patient has creatinine of 1.6 on admission, there is no recent baseline  -trending down   Hyponatremia  - Probably due to poor oral solute intake.  - Will continue IVF rehydration but decrease rate today and encourage po intake.  - Will add po supplement today.  Anxiety/depression -PRN xanax -buspar- titrate up    Code Status: full Family Communication: family at bedside Disposition Plan: home when better- PT consult done    HPI/Subjective: C/o anxiety SOB with exertion   Objective: Filed Vitals:   04/08/12 2147 04/09/12 0520 04/09/12 0843 04/09/12 1339  BP:  137/53  158/73  Pulse:  74  69  Temp:  98 F (36.7  C)  98.7 F (37.1 C)  TempSrc:  Oral  Oral  Resp:  18  20  Height:      Weight:      SpO2: 90% 90% 90% 93%    Intake/Output Summary (Last 24 hours) at 04/09/12 1411 Last data filed at 04/09/12 1347  Gross per 24 hour  Intake 2964.5 ml  Output    400 ml  Net 2564.5 ml   Filed Weights   04/05/12 2047  Weight: 76.8 kg (169 lb 5 oz)    Exam:   General:  Tearful, NAD, back on O2  Cardiovascular: rrr  Respiratory: wheezes b/L, no coarse BS  Abdomen: +BS, soft, NT/ND  Data Reviewed: Basic Metabolic Panel:  Lab 04/09/12 9562 04/08/12 0322 04/07/12 1045 04/06/12 0350 04/05/12 1410  NA 132* 132* 134* 130* 129*  K 3.8 3.8 3.7 3.6 4.4  CL 101 102 102 98 94*  CO2 23 21 22 24 21   GLUCOSE 111* 97 181* 104* 124*  BUN 14 16 20  24* 30*  CREATININE 1.46* 1.61* 1.68* 1.54* 1.60*  CALCIUM 7.8* 7.5* 7.7* 7.9* 8.9  MG -- -- -- -- --  PHOS -- -- -- -- --   Liver Function Tests:  Lab 04/06/12 0350 04/05/12 1410  AST 16 27  ALT 13 16  ALKPHOS 54 63  BILITOT 0.4 0.4  PROT 5.6* 7.2  ALBUMIN 2.7* 3.8    Lab 04/05/12 1410  LIPASE 26  AMYLASE --   No results found for this basename: AMMONIA:5 in the last 168 hours CBC:  Lab 04/08/12 0322 04/06/12 0350 04/05/12 1520  04/05/12 1212  WBC 9.8 14.7* 18.7* 20.9*  NEUTROABS -- -- 16.9* --  HGB 9.5* 10.3* 11.6* 13.1  HCT 27.7* 30.2* 33.9* 42.0  MCV 91.7 90.7 90.2 95.3  PLT 163 185 232 --   Cardiac Enzymes: No results found for this basename: CKTOTAL:5,CKMB:5,CKMBINDEX:5,TROPONINI:5 in the last 168 hours BNP (last 3 results) No results found for this basename: PROBNP:3 in the last 8760 hours CBG: No results found for this basename: GLUCAP:5 in the last 168 hours  Recent Results (from the past 240 hour(s))  URINE CULTURE     Status: Normal   Collection Time   04/05/12  4:06 PM      Component Value Range Status Comment   Specimen Description URINE, CLEAN CATCH   Final    Special Requests NONE   Final    Culture  Setup Time  04/06/2012 00:30   Final    Colony Count >=100,000 COLONIES/ML   Final    Culture ESCHERICHIA COLI   Final    Report Status 04/07/2012 FINAL   Final    Organism ID, Bacteria ESCHERICHIA COLI   Final   CULTURE, BLOOD (ROUTINE X 2)     Status: Normal (Preliminary result)   Collection Time   04/05/12  7:30 PM      Component Value Range Status Comment   Specimen Description BLOOD LEFT ARM   Final    Special Requests BOTTLES DRAWN AEROBIC AND ANAEROBIC 2 CC EACH   Final    Culture  Setup Time 04/05/2012 23:33   Final    Culture     Final    Value:        BLOOD CULTURE RECEIVED NO GROWTH TO DATE CULTURE WILL BE HELD FOR 5 DAYS BEFORE ISSUING A FINAL NEGATIVE REPORT   Report Status PENDING   Incomplete   CULTURE, BLOOD (ROUTINE X 2)     Status: Normal   Collection Time   04/05/12  7:35 PM      Component Value Range Status Comment   Specimen Description BLOOD LEFT AC   Final    Special Requests BOTTLES DRAWN AEROBIC AND ANAEROBIC 3 CC EACH   Final    Culture  Setup Time 04/05/2012 23:33   Final    Culture     Final    Value: ESCHERICHIA COLI     Note: Gram Stain Report Called to,Read Back By and Verified WithRoney Mans @ 1232 04/06/12 BY KRAWS   Report Status 04/08/2012 FINAL   Final    Organism ID, Bacteria ESCHERICHIA COLI   Final      Studies: Dg Chest 2 View  04/05/2012  *RADIOLOGY REPORT*  Clinical Data: Shortness of breath, weakness, former smoking history  CHEST - 2 VIEW  Comparison: None.  Findings: There is patchy parenchymal opacity in the left lower lobe most consistent with pneumonia.  A few air-fluid levels in the retrocardiac region on the lateral view probably represent a hiatal hernia.  The right lung is clear.  There is mild peribronchial thickening present.  The heart is mildly enlarged.  No bony abnormality is seen.  IMPRESSION:  1.  Suspect left lower lobe pneumonia.  Recommend follow-up. 2.  Probable hiatal hernia.  Original Report Authenticated By: Juline Patch, M.D.     Scheduled Meds:   . albuterol  2.5 mg Nebulization TID  . amoxicillin-clavulanate  1 tablet Oral BID  . aspirin EC  81 mg Oral Daily  . cholecalciferol  1,000 Units Oral Daily  .  ciprofloxacin  500 mg Oral BID  . diltiazem  180 mg Oral Daily  . guaiFENesin  600 mg Oral BID  . ipratropium  0.5 mg Nebulization TID  . levothyroxine  175 mcg Oral Q48H  . levothyroxine  200 mcg Oral Q48H  . lisinopril  10 mg Oral Daily  . LORazepam  0.5 mg Oral QHS  . pravastatin  40 mg Oral Daily   Continuous Infusions:   . sodium chloride 75 mL/hr at 04/09/12 1115    Principal Problem:  *Left lower lobe pneumonia Active Problems:  UTI (lower urinary tract infection)  Nausea and vomiting  LLQ abdominal pain  Acute renal failure  Hyponatremia  Bacteremia    Time spent: 35    Marlin Canary  [facility] Pager [phone] 04/09/2012, 2:11 PM  LOS: 4 days

## 2012-04-09 NOTE — Progress Notes (Signed)
MD, Patient woke up this morning with her lips extremely swollen and a dry mouth. No other complaints.  Just wanted to make you aware, will continue to monitor. Abigail [name]

## 2012-04-10 LAB — BASIC METABOLIC PANEL
BUN: 16 mg/dL (ref 6–23)
CO2: 22 mEq/L (ref 19–32)
Calcium: 8.7 mg/dL (ref 8.4–10.5)
Chloride: 103 mEq/L (ref 96–112)
Creatinine, Ser: 1.55 mg/dL — ABNORMAL HIGH (ref 0.50–1.10)
GFR calc Af Amer: 37 mL/min — ABNORMAL LOW (ref >90)
GFR calc non Af Amer: 32 mL/min — ABNORMAL LOW (ref >90)
Glucose, Bld: 156 mg/dL — ABNORMAL HIGH (ref 70–99)
Potassium: 4.1 mEq/L (ref 3.5–5.1)
Sodium: 137 mEq/L (ref 135–145)

## 2012-04-10 LAB — CBC
HCT: 34.7 % — ABNORMAL LOW (ref 36.0–46.0)
Hemoglobin: 11.6 g/dL — ABNORMAL LOW (ref 12.0–15.0)
MCH: 30.4 pg (ref 26.0–34.0)
MCHC: 33.4 g/dL (ref 30.0–36.0)
MCV: 90.8 fL (ref 78.0–100.0)
Platelets: 255 10*3/uL (ref 150–400)
RBC: 3.82 MIL/uL — ABNORMAL LOW (ref 3.87–5.11)
RDW: 13.8 % (ref 11.5–15.5)
WBC: 7 10*3/uL (ref 4.0–10.5)

## 2012-04-10 MED ORDER — ALBUTEROL SULFATE (5 MG/ML) 0.5% IN NEBU
2.5000 mg | INHALATION_SOLUTION | Freq: Two times a day (BID) | RESPIRATORY_TRACT | Status: DC
  Administered 2012-04-11 – 2012-04-12 (×3): 2.5 mg via RESPIRATORY_TRACT
  Filled 2012-04-10 (×3): qty 0.5

## 2012-04-10 MED ORDER — IPRATROPIUM BROMIDE 0.02 % IN SOLN
0.5000 mg | Freq: Two times a day (BID) | RESPIRATORY_TRACT | Status: DC
  Administered 2012-04-11 – 2012-04-12 (×3): 0.5 mg via RESPIRATORY_TRACT
  Filled 2012-04-10 (×3): qty 2.5

## 2012-04-10 NOTE — Evaluation (Signed)
Physical Therapy Evaluation Patient Details Name: Abigail [name] MRN: [id number] DOB: [birth year] Today's Date: 04/10/2012 Time: 1478-2956 PT Time Calculation (min): 17 min  PT Assessment / Plan / Recommendation Clinical Impression  76 y.o. female admitted with pneumonia. Pt ambulated 80' x 2 with SaO2 dropping to 82% on RA, 91% on 2L O2.  Good progress expected. Pt hopeful she can DC home without home O2.      PT Assessment  Patient needs continued PT services    Follow Up Recommendations  No PT follow up    Barriers to Discharge None      Equipment Recommendations  Other (comment) (possibly home O2 depending on sat levels at time of DC)    Recommendations for Other Services     Frequency Min 3X/week    Precautions / Restrictions Precautions Precautions: None Restrictions Weight Bearing Restrictions: No   Pertinent Vitals/Pain *0/10 pain SaO2 82% on RA while walking, 91% on 2L O2 BP 181/83 after walking, RN notified**      Mobility  Bed Mobility Bed Mobility: Not assessed Transfers Transfers: Sit to Stand;Stand to Sit Sit to Stand: 7: Independent;From bed Stand to Sit: 7: Independent;To bed Ambulation/Gait Ambulation/Gait Assistance: 5: Supervision Ambulation Distance (Feet): 160 Feet (80' x 2 with seated rest break 2* low SaO2) Assistive device: None Gait Pattern: Within Functional Limits General Gait Details: SaO2 dropped to 82% on RA while walking, 91% on 2L O2 while walking, BP 181/83 after walking,  RN notified Stairs: No Wheelchair Mobility Wheelchair Mobility: No    Exercises     PT Diagnosis: Difficulty walking  PT Problem List: Decreased activity tolerance;Cardiopulmonary status limiting activity PT Treatment Interventions: Gait training;Therapeutic exercise;Patient/family education   PT Goals Acute Rehab PT Goals PT Goal Formulation: With patient/family Time For Goal Achievement: 04/14/12 Potential to Achieve Goals: Good Pt will Ambulate: >150  feet;Independently (with SaO2 greater than 90% on RA) PT Goal: Ambulate - Progress: Goal set today Pt will Go Up / Down Stairs: 3-5 stairs;Independently PT Goal: Up/Down Stairs - Progress: Goal set today Pt will Perform Home Exercise Program: Independently PT Goal: Perform Home Exercise Program - Progress: Goal set today  Visit Information  Last PT Received On: 04/10/12 Assistance Needed: +1    Subjective Data  Subjective: I always get sick when I slow down with work.  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 Communication Communication: No difficulties    Cognition  Overall Cognitive Status: Appears within functional limits for tasks assessed/performed Arousal/Alertness: Awake/alert Orientation Level: Appears intact for tasks assessed Behavior During Session: [facility] for tasks performed    Extremity/Trunk Assessment Right Upper Extremity Assessment RUE ROM/Strength/Tone: [facility] for tasks assessed Left Upper Extremity Assessment LUE ROM/Strength/Tone: WFL for tasks assessed Right Lower Extremity Assessment RLE ROM/Strength/Tone: [facility] for tasks assessed Left Lower Extremity Assessment LLE ROM/Strength/Tone: [facility] for tasks assessed Trunk Assessment Trunk Assessment: Normal   Balance Balance Balance Assessed: Yes Static Standing Balance Static Standing - Balance Support: No upper extremity supported Static Standing - Level of Assistance: 7: Independent Static Standing - Comment/# of Minutes: 2  End of Session PT - End of Session Equipment Utilized During Treatment: Oxygen Activity Tolerance: Patient tolerated  treatment  well Patient left: in bed;with call bell/phone within reach;with family/visitor present Nurse Communication: Mobility status  GP     Ralene Bathe Kistler 04/10/2012, 3:16 PM [phone]

## 2012-04-10 NOTE — Progress Notes (Addendum)
[facility] PROGRESS NOTE  Abigail [name] ZOX:[id number] DOB: [birth year] DOA: 04/05/2012 PCP: Alva Garnet., MD  Brief narrative: 76 year old female admitted with working diagnosis of left lower lobe pneumonia. Hospital course complicated by the development of E.coli bacteremia as well as UTI secondary to E.Coli.  Assessment/Plan:  Principal Problem: *Left lower lobe pneumonia  - will continue Augmentin PO - continue PO steroids - continue nebulizer treatments as needed for shortness of breath - O2 via nasal canula as needed to keep O2 saturation above 90% - Needs PT evaluation prior to discharge home  Active Problems: Urinary tract infection secondary to E.coli - patient was on IV antibiotics and now on PO cipro - will continue this regimen  E. Coli bacteremia - we need repeat blood cultures x 2 to ensure proper clearing of the bloodstream infection - continue antibiotics as mentioned above, Augmentin and Cipro (cipro started 04/08/2012 PO) - if blood cultures still positive patient will need PICC line insertion for longer IV antibiotic course - also, if blood cultures should show persistent bacteremia, please obtain blood culture every other day regardless of WBC or fever  Acute kidney injury - likely prerenal - creatinine down since admission - follow up am BMP  Hyponatremia  - secondary to dehydration - resolved with IV fluids  Anemia - we do not have previous values for comparison - hemoglobin stable at 11.6 - no signs of active bleed  Hypertension - continue lisinopril and diltiazem - BP 155/82  Dyslipidemia - continue statin  Hypothyroidism - continue synthroid per home regimen  Code Status: full code Family Communication: none at bedside Disposition Plan: home when stable  Manson Passey, MD  [facility] Pager [phone]  If 7PM-7AM, please contact night-coverage [email or web address] Password [email or web address] 04/10/2012, 2:37 PM   LOS: 5 days     Consultants:  none  Procedures:  none  Antibiotics:  Augmentin  Cipro  HPI/Subjective: No acute overnight events  Objective: Filed Vitals:   04/09/12 2100 04/09/12 2212 04/10/12 0534 04/10/12 0903  BP:  146/71 155/82   Pulse: 70 87 68   Temp:  99.2 F (37.3 C) 97.9 F (36.6 C)   TempSrc:  Oral Oral   Resp: 20 18 20    Height:      Weight:      SpO2:  90% 94% 89%    Intake/Output Summary (Last 24 hours) at 04/10/12 1437 Last data filed at 04/10/12 1108  Gross per 24 hour  Intake 2670.08 ml  Output   2075 ml  Net 595.08 ml    Exam:   General:  Pt is alert, follows commands appropriately, not in acute distress  Cardiovascular: Regular rate and rhythm, S1/S2, no murmurs, no rubs, no gallops  Respiratory: wheezing appreciated and rhonchi in left lower lung area appreciated  Abdomen: Soft, non tender, non distended, bowel sounds present, no guarding  Extremities: No edema, pulses DP and PT palpable bilaterally  Neuro: Grossly nonfocal  Data Reviewed: Basic Metabolic Panel:  Lab 04/10/12 8295 04/09/12 0345 04/08/12 0322 04/07/12 1045 04/06/12 0350  NA 137 132* 132* 134* 130*  K 4.1 3.8 3.8 3.7 3.6  CL 103 101 102 102 98  CO2 22 23 21 22 24   GLUCOSE 156* 111* 97 181* 104*  BUN 16 14 16 20  24*  CREATININE 1.55* 1.46* 1.61* 1.68* 1.54*  CALCIUM 8.7 7.8* 7.5* 7.7* 7.9*  MG -- -- -- -- --  PHOS -- -- -- -- --   Liver Function Tests:  Lab 04/06/12 0350 04/05/12 1410  AST 16 27  ALT 13 16  ALKPHOS 54 63  BILITOT 0.4 0.4  PROT 5.6* 7.2  ALBUMIN 2.7* 3.8    Lab 04/05/12 1410  LIPASE 26  AMYLASE --   CBC:  Lab 04/10/12 0355 04/08/12 0322 04/06/12 0350 04/05/12 1520 04/05/12 1212  WBC 7.0 9.8 14.7* 18.7* 20.9*  NEUTROABS -- -- -- 16.9* --  HGB 11.6* 9.5* 10.3* 11.6* 13.1  HCT 34.7* 27.7* 30.2* 33.9* 42.0  MCV 90.8 91.7 90.7 90.2 95.3  PLT 255 163 185 232 --    Recent Results (from the past 240 hour(s))  URINE CULTURE     Status: Normal    Collection Time   04/05/12  4:06 PM      Component Value Range Status Comment   Specimen Description URINE, CLEAN CATCH   Final    Special Requests NONE   Final    Culture  Setup Time 04/06/2012 00:30   Final    Colony Count >=100,000 COLONIES/ML   Final    Culture ESCHERICHIA COLI   Final    Report Status 04/07/2012 FINAL   Final    Organism ID, Bacteria ESCHERICHIA COLI   Final   CULTURE, BLOOD (ROUTINE X 2)     Status: Normal (Preliminary result)   Collection Time   04/05/12  7:30 PM      Component Value Range Status Comment   Specimen Description BLOOD LEFT ARM   Final    Special Requests BOTTLES DRAWN AEROBIC AND ANAEROBIC 2 CC EACH   Final    Culture  Setup Time 04/05/2012 23:33   Final    Culture     Final    Value:        BLOOD CULTURE RECEIVED NO GROWTH TO DATE CULTURE WILL BE HELD FOR 5 DAYS BEFORE ISSUING A FINAL NEGATIVE REPORT   Report Status PENDING   Incomplete   CULTURE, BLOOD (ROUTINE X 2)     Status: Normal   Collection Time   04/05/12  7:35 PM      Component Value Range Status Comment   Specimen Description BLOOD LEFT AC   Final    Special Requests BOTTLES DRAWN AEROBIC AND ANAEROBIC 3 CC EACH   Final    Culture  Setup Time 04/05/2012 23:33   Final    Culture     Final    Value: ESCHERICHIA COLI     Note: Gram Stain Report Called to,Read Back By and Verified WithRoney Mans @ 1232 04/06/12 BY KRAWS   Report Status 04/08/2012 FINAL   Final    Organism ID, Bacteria ESCHERICHIA COLI   Final      Studies: No results found.  Scheduled Meds:   . albuterol  2.5 mg Nebulization TID  . amoxicillin-clavulanate  1 tablet Oral BID  . aspirin EC  81 mg Oral Daily  . busPIRone  5 mg Oral BID  . cholecalciferol  1,000 Units Oral Daily  . ciprofloxacin  500 mg Oral BID  . diltiazem  180 mg Oral Daily  . guaiFENesin  600 mg Oral BID  . ipratropium  0.5 mg Nebulization TID  . levothyroxine  175 mcg Oral Q48H  . levothyroxine  200 mcg Oral Q48H  . lisinopril  10 mg  Oral Daily  . pravastatin  40 mg Oral Daily  . predniSONE  40 mg Oral Q breakfast   Continuous Infusions:   . sodium chloride 75 mL/hr at 04/09/12 1115

## 2012-04-11 LAB — CULTURE, BLOOD (ROUTINE X 2): Culture: NO GROWTH

## 2012-04-11 MED ORDER — LEVOTHYROXINE SODIUM 175 MCG PO TABS
175.0000 ug | ORAL_TABLET | ORAL | Status: DC
  Administered 2012-04-12: 175 ug via ORAL
  Filled 2012-04-11: qty 1

## 2012-04-11 MED ORDER — DOCUSATE SODIUM 100 MG PO CAPS
100.0000 mg | ORAL_CAPSULE | Freq: Two times a day (BID) | ORAL | Status: DC
  Administered 2012-04-11: 100 mg via ORAL
  Filled 2012-04-11 (×4): qty 1

## 2012-04-11 MED ORDER — LEVOTHYROXINE SODIUM 200 MCG PO TABS
200.0000 ug | ORAL_TABLET | ORAL | Status: DC
  Administered 2012-04-11: 200 ug via ORAL
  Filled 2012-04-11 (×2): qty 1

## 2012-04-11 NOTE — Progress Notes (Signed)
[facility] PROGRESS NOTE  Abigail [name] NWG:[id number] DOB: 06-01-1936 DOA: 04/05/2012 PCP: Alva Garnet., MD  Brief narrative: 76 year old female admitted with working diagnosis of left lower lobe pneumonia. Hospital course complicated by the development of E.coli bacteremia as well as UTI secondary to E.Coli.   Assessment/Plan:   Principal Problem:  *Left lower lobe pneumonia  - will continue Augmentin PO  - will continue PO steroids  - will continue nebulizer treatments as needed for shortness of breath  - O2 via nasal canula as needed to keep O2 saturation above 90%   Active Problems:  Urinary tract infection secondary to E.coli  - patient was on IV antibiotics and now on PO cipro  - will continue this regimen   E. Coli bacteremia  - we need repeat blood cultures x 2 to ensure proper clearing of the bloodstream infection  - continue antibiotics as mentioned above, Augmentin and Cipro (cipro started 04/08/2012 PO)  - repeat blood cultures show no growth to date  Acute kidney injury  - likely prerenal  - creatinine is down since admission  - follow up am BMP   Hyponatremia  - secondary to dehydration  - resolved with IV fluids   Anemia  - we do not have previous values for comparison  - hemoglobin stable at 11.6  - no signs of active bleed   Hypertension  - continue lisinopril and diltiazem  - BP 134/69  Dyslipidemia  - continue statin   Hypothyroidism  - continue synthroid per home regimen   Code Status: full code  Family Communication: none at bedside  Disposition Plan: home when stable   Consultants:  none Procedures:  none Antibiotics:  Augmentin  Cipro  Manson Passey, MD  [facility] Pager [phone]  If 7PM-7AM, please contact night-coverage [email or web address] Password [facility] 04/11/2012, 2:32 PM   LOS: 6 days   HPI/Subjective: No acute events overnight.  Objective: Filed Vitals:   04/11/12 0535 04/11/12 0848  04/11/12 1203 04/11/12 1205  BP: 134/69  134/69 134/69  Pulse: 102     Temp: 98.3 F (36.8 C)     TempSrc: Oral     Resp: 18     Height:      Weight:      SpO2: 94% 96%      Intake/Output Summary (Last 24 hours) at 04/11/12 1432 Last data filed at 04/11/12 0911  Gross per 24 hour  Intake 2012.42 ml  Output   2050 ml  Net -37.58 ml    Exam:   General:  Pt is alert, follows commands appropriately, not in acute distress  Cardiovascular: Regular rate and rhythm, S1/S2, no murmurs, no rubs, no gallops  Respiratory: rhonchi appreciated over left upper and mid lung; no wheezing  Abdomen: Soft, non tender, non distended, bowel sounds present, no guarding  Extremities: No edema, pulses DP and PT palpable bilaterally  Neuro: Grossly nonfocal  Data Reviewed: Basic Metabolic Panel:  Lab 04/10/12 2952 04/09/12 0345 04/08/12 0322 04/07/12 1045 04/06/12 0350  NA 137 132* 132* 134* 130*  K 4.1 3.8 3.8 3.7 3.6  CL 103 101 102 102 98  CO2 22 23 21 22 24   GLUCOSE 156* 111* 97 181* 104*  BUN 16 14 16 20  24*  CREATININE 1.55* 1.46* 1.61* 1.68* 1.54*  CALCIUM 8.7 7.8* 7.5* 7.7* 7.9*  MG -- -- -- -- --  PHOS -- -- -- -- --   Liver Function Tests:  Lab 04/06/12 0350 04/05/12 1410  AST 16 27  ALT 13 16  ALKPHOS 54 63  BILITOT 0.4 0.4  PROT 5.6* 7.2  ALBUMIN 2.7* 3.8    Lab 04/05/12 1410  LIPASE 26  AMYLASE --   No results found for this basename: AMMONIA:5 in the last 168 hours CBC:  Lab 04/10/12 0355 04/08/12 0322 04/06/12 0350 04/05/12 1520 04/05/12 1212  WBC 7.0 9.8 14.7* 18.7* 20.9*  NEUTROABS -- -- -- 16.9* --  HGB 11.6* 9.5* 10.3* 11.6* 13.1  HCT 34.7* 27.7* 30.2* 33.9* 42.0  MCV 90.8 91.7 90.7 90.2 95.3  PLT 255 163 185 232 --     Recent Results (from the past 240 hour(s))  URINE CULTURE     Status: Normal   Collection Time   04/05/12  4:06 PM      Component Value Range Status Comment   Specimen Description URINE, CLEAN CATCH   Final    Special  Requests NONE   Final    Culture  Setup Time 04/06/2012 00:30   Final    Colony Count >=100,000 COLONIES/ML   Final    Culture ESCHERICHIA COLI   Final    Report Status 04/07/2012 FINAL   Final    Organism ID, Bacteria ESCHERICHIA COLI   Final   CULTURE, BLOOD (ROUTINE X 2)     Status: Normal   Collection Time   04/05/12  7:30 PM      Component Value Range Status Comment   Specimen Description BLOOD LEFT ARM   Final    Special Requests BOTTLES DRAWN AEROBIC AND ANAEROBIC 2 CC EACH   Final    Culture  Setup Time 04/05/2012 23:33   Final    Culture NO GROWTH 5 DAYS   Final    Report Status 04/11/2012 FINAL   Final   CULTURE, BLOOD (ROUTINE X 2)     Status: Normal   Collection Time   04/05/12  7:35 PM      Component Value Range Status Comment   Specimen Description BLOOD LEFT AC   Final    Special Requests BOTTLES DRAWN AEROBIC AND ANAEROBIC 3 CC EACH   Final    Culture  Setup Time 04/05/2012 23:33   Final    Culture     Final    Value: ESCHERICHIA COLI     Note: Gram Stain Report Called to,Read Back By and Verified With: Roney Mans @ 1232 04/06/12 BY KRAWS   Report Status 04/08/2012 FINAL   Final    Organism ID, Bacteria ESCHERICHIA COLI   Final   CULTURE, BLOOD (ROUTINE X 2)     Status: Normal (Preliminary result)   Collection Time   04/10/12  3:57 PM      Component Value Range Status Comment   Specimen Description BLOOD LEFT ARM   Final    Special Requests     Final    Value: BOTTLES DRAWN AEROBIC AND ANAEROBIC  3CC BOTH BOTTLES   Culture  Setup Time 04/10/2012 21:56   Final    Culture     Final    Value:        BLOOD CULTURE RECEIVED NO GROWTH TO DATE CULTURE WILL BE HELD FOR 5 DAYS BEFORE ISSUING A FINAL NEGATIVE REPORT   Report Status PENDING   Incomplete   CULTURE, BLOOD (ROUTINE X 2)     Status: Normal (Preliminary result)   Collection Time   04/10/12  4:03 PM      Component Value Range Status Comment  Specimen Description BLOOD LEFT ARM   Final    Special Requests      Final    Value: BOTTLES DRAWN AEROBIC AND ANAEROBIC 3CC BOTH BOTTLES   Culture  Setup Time 04/10/2012 21:56   Final    Culture     Final    Value:        BLOOD CULTURE RECEIVED NO GROWTH TO DATE CULTURE WILL BE HELD FOR 5 DAYS BEFORE ISSUING A FINAL NEGATIVE REPORT   Report Status PENDING   Incomplete      Studies: No results found.  Scheduled Meds:   . albuterol  2.5 mg Nebulization BID  . amoxicillin-clavulanate  1 tablet Oral BID  . aspirin EC  81 mg Oral Daily  . busPIRone  5 mg Oral BID  . cholecalciferol  1,000 Units Oral Daily  . ciprofloxacin  500 mg Oral BID  . diltiazem  180 mg Oral Daily  . guaiFENesin  600 mg Oral BID  . ipratropium  0.5 mg Nebulization BID  . levothyroxine  175 mcg Oral Q48H  . levothyroxine  200 mcg Oral Q48H  . lisinopril  10 mg Oral Daily  . pravastatin  40 mg Oral Daily  . predniSONE  40 mg Oral Q breakfast

## 2012-04-12 DIAGNOSIS — J189 Pneumonia, unspecified organism: Secondary | ICD-10-CM | POA: Diagnosis present

## 2012-04-12 HISTORY — DX: Pneumonia, unspecified organism: J18.9

## 2012-04-12 LAB — BASIC METABOLIC PANEL WITH GFR
BUN: 24 mg/dL — ABNORMAL HIGH (ref 6–23)
CO2: 24 meq/L (ref 19–32)
Calcium: 8.5 mg/dL (ref 8.4–10.5)
Chloride: 103 meq/L (ref 96–112)
Creatinine, Ser: 1.04 mg/dL (ref 0.50–1.10)
GFR calc Af Amer: 60 mL/min — ABNORMAL LOW
GFR calc non Af Amer: 52 mL/min — ABNORMAL LOW
Glucose, Bld: 130 mg/dL — ABNORMAL HIGH (ref 70–99)
Potassium: 3.7 meq/L (ref 3.5–5.1)
Sodium: 135 meq/L (ref 135–145)

## 2012-04-12 LAB — CBC
HCT: 33 % — ABNORMAL LOW (ref 36.0–46.0)
Hemoglobin: 11.2 g/dL — ABNORMAL LOW (ref 12.0–15.0)
MCH: 30.6 pg (ref 26.0–34.0)
MCHC: 33.9 g/dL (ref 30.0–36.0)
MCV: 90.2 fL (ref 78.0–100.0)
Platelets: 372 10*3/uL (ref 150–400)
RBC: 3.66 MIL/uL — ABNORMAL LOW (ref 3.87–5.11)
RDW: 13.9 % (ref 11.5–15.5)
WBC: 8.4 10*3/uL (ref 4.0–10.5)

## 2012-04-12 MED ORDER — CIPROFLOXACIN HCL 500 MG PO TABS: 500.0000 mg | ORAL_TABLET | Freq: Two times a day (BID) | ORAL | Status: AC

## 2012-04-12 NOTE — Progress Notes (Signed)
Explained discharge instructions to pt. Prescription given to pt. Discharged to sister.

## 2012-04-12 NOTE — Discharge Summary (Addendum)
Physician Discharge Summary  Abigail [name] ZOX:[id number] DOB: 02-01-1936 DOA: 04/05/2012  PCP: Alva Garnet., MD  Admit date: 04/05/2012 Discharge date: 04/12/2012  Recommendations for Outpatient Follow-up:  1.  check creatinine in one week and if stable can resume HCTZ and lisinopril 2. Please repeat chest x-ray in 3-4 weeks to evaluate resolution of the pneumonia  Discharge Diagnoses:  Active Problems:  UTI (lower urinary tract infection) with Escherichia coli bacteremia CAP (community acquired pneumonia)  Nausea and vomiting  LLQ abdominal pain  Acute renal failure  Hyponatremia     Discharge Condition: Fair  Diet recommendation: Low sodium  Filed Weights   04/05/12 2047  Weight: 76.8 kg (169 lb 5 oz)     HPI:  76 year old Caucasian female with past medical history of hypertension, hypothyroidism and diverticulitis. Patient went to urgent care because of nausea, vomiting abdominal pain. Patient said she was in her usual state of health until last night, when she woke up this morning she started to have these symptoms. She also felt like there is a pain in the left side of her chest and she felt she might pulled a muscle. Patient denies any high-grade fever, denies chills, denies cough/sputum production or shortness of breath. Patient was noted to have left LL pneumonia and UTI for which she was admitted.    Hospital course:  *Left lower lobe pneumonia  - Patient was initially started on IV Rocephin and then switched to by mouth Augmentin and has now completed a seven-day course of antibiotics. She was noted to be saturating initially and was given or short duration on by mouth prednisone and nebs. Her oxygen saturations are now stable on room air and on ambulation.   Urinary tract infection with Escherichia coli bacteremia  - Patient was initially on IV Rocephin . Blood  culture growing Escherichia coli which was sensitive to both cipro and rocephin. Patient now on  po cipro for a 2 weeks course and will be discharge home on it ( will complete abx on 8/26) -Repeat blood culture on 817 with no growth  Acute kidney injury  - likely prerenal  - creatinine improved this morning. Holding HCTZ and lisinopril on discharge and need to follow renal function urine outpatient visit in one week and if stable her blood pressure medicine can be resumed  Hyponatremia  - secondary to dehydration  - resolved with IV fluids   Anemia  - we do not have previous values for comparison  - hemoglobin stable at 11.2  - no signs of active bleed  - followed up as outpatient  Hypertension  - continue diltiazem  - BP currently stable  -Holding HCTZ and lisinopril on discharge and his renal function is stable during outpatient visit and can be resumed  Dyslipidemia  - continue statin   Hypothyroidism  - continue synthroid per home regimen   Patient is clinically stable for discharge home with outpatient followup.   Procedures:  None  Consultations:  None  Discharge Exam: Filed Vitals:   04/12/12 0549  BP: 146/75  Pulse: 77  Temp: 98.1 F (36.7 C)  Resp: 20   Filed Vitals:   04/11/12 2138 04/12/12 0549 04/12/12 0924 04/12/12 1002  BP:  146/75    Pulse:  77    Temp:  98.1 F (36.7 C)    TempSrc:  Oral    Resp:  20    Height:      Weight:      SpO2: 96% 96% 96% 94%  General: Elderly female in no acute distress Cardiovascular: Normal S1 and S2 no murmur rub or gallop Respiratory: Clear breath sounds bilaterally, no added sounds Abdomen: Soft nontender nondistended, bowel sounds present Extremities: Warm, no edema CNS: AA O x3 nonfocal  Discharge Instructions   Medication List  As of 04/12/2012 10:15 AM   STOP taking these medications               lisinopril-hydrochlorothiazide 20-25 MG per tablet         TAKE these medications         aspirin 81 MG tablet   Take 81 mg by mouth daily.      cholecalciferol 1000 UNITS tablet    Commonly known as: VITAMIN D   Take 1,000 Units by mouth daily.      ciprofloxacin 500 MG tablet   Commonly known as: CIPRO   Take 1 tablet (500 mg total) by mouth 2 (two) times daily.      co-enzyme Q-10 30 MG capsule   Take 30 mg by mouth 3 (three) times daily.      diltiazem 180 MG 24 hr capsule   Commonly known as: DILACOR XR   Take 180 mg by mouth daily.      levothyroxine 175 MCG tablet   Commonly known as: SYNTHROID, LEVOTHROID   Take 175 mcg by mouth daily.      levothyroxine 200 MCG tablet   Commonly known as: SYNTHROID, LEVOTHROID   Take 200 mcg by mouth daily. Pt alternates between 175 mcg and the 200 mcg daily      pravastatin 40 MG tablet   Commonly known as: PRAVACHOL   Take 40 mg by mouth daily.           Follow-up Information    Follow up with Alva Garnet., MD in 1 week. (needs repeat CXR in 3-4 weeks to see resolution of pneumonia)    Contact information:   [address] [place] [address] [phone]           The results of significant diagnostics from this hospitalization (including imaging, microbiology, ancillary and laboratory) are listed below for reference.    Significant Diagnostic Studies: Dg Chest 2 View  04/05/2012  *RADIOLOGY REPORT*  Clinical Data: Shortness of breath, weakness, former smoking history  CHEST - 2 VIEW  Comparison: None.  Findings: There is patchy parenchymal opacity in the left lower lobe most consistent with pneumonia.  A few air-fluid levels in the retrocardiac region on the lateral view probably represent a hiatal hernia.  The right lung is clear.  There is mild peribronchial thickening present.  The heart is mildly enlarged.  No bony abnormality is seen.  IMPRESSION:  1.  Suspect left lower lobe pneumonia.  Recommend follow-up. 2.  Probable hiatal hernia.  Original Report Authenticated By: Juline Patch, M.D.    Microbiology: Recent Results (from the past 240 hour(s))  URINE  CULTURE     Status: Normal   Collection Time   04/05/12  4:06 PM      Component Value Range Status Comment   Specimen Description URINE, CLEAN CATCH   Final    Special Requests NONE   Final    Culture  Setup Time 04/06/2012 00:30   Final    Colony Count >=100,000 COLONIES/ML   Final    Culture ESCHERICHIA COLI   Final    Report Status 04/07/2012 FINAL   Final    Organism ID, Bacteria ESCHERICHIA  COLI   Final   CULTURE, BLOOD (ROUTINE X 2)     Status: Normal   Collection Time   04/05/12  7:30 PM      Component Value Range Status Comment   Specimen Description BLOOD LEFT ARM   Final    Special Requests BOTTLES DRAWN AEROBIC AND ANAEROBIC 2 CC EACH   Final    Culture  Setup Time 04/05/2012 23:33   Final    Culture NO GROWTH 5 DAYS   Final    Report Status 04/11/2012 FINAL   Final   CULTURE, BLOOD (ROUTINE X 2)     Status: Normal   Collection Time   04/05/12  7:35 PM      Component Value Range Status Comment   Specimen Description BLOOD LEFT AC   Final    Special Requests BOTTLES DRAWN AEROBIC AND ANAEROBIC 3 CC EACH   Final    Culture  Setup Time 04/05/2012 23:33   Final    Culture     Final    Value: ESCHERICHIA COLI     Note: Gram Stain Report Called to,Read Back By and Verified With: Roney Mans @ 1232 04/06/12 BY KRAWS   Report Status 04/08/2012 FINAL   Final    Organism ID, Bacteria ESCHERICHIA COLI   Final   CULTURE, BLOOD (ROUTINE X 2)     Status: Normal (Preliminary result)   Collection Time   04/10/12  3:57 PM      Component Value Range Status Comment   Specimen Description BLOOD LEFT ARM   Final    Special Requests     Final    Value: BOTTLES DRAWN AEROBIC AND ANAEROBIC  3CC BOTH BOTTLES   Culture  Setup Time 04/10/2012 21:56   Final    Culture     Final    Value:        BLOOD CULTURE RECEIVED NO GROWTH TO DATE CULTURE WILL BE HELD FOR 5 DAYS BEFORE ISSUING A FINAL NEGATIVE REPORT   Report Status PENDING   Incomplete   CULTURE, BLOOD (ROUTINE X 2)     Status: Normal  (Preliminary result)   Collection Time   04/10/12  4:03 PM      Component Value Range Status Comment   Specimen Description BLOOD LEFT ARM   Final    Special Requests     Final    Value: BOTTLES DRAWN AEROBIC AND ANAEROBIC 3CC BOTH BOTTLES   Culture  Setup Time 04/10/2012 21:56   Final    Culture     Final    Value:        BLOOD CULTURE RECEIVED NO GROWTH TO DATE CULTURE WILL BE HELD FOR 5 DAYS BEFORE ISSUING A FINAL NEGATIVE REPORT   Report Status PENDING   Incomplete      Labs: Basic Metabolic Panel:  Lab 04/12/12 1610 04/10/12 0355 04/09/12 0345 04/08/12 0322 04/07/12 1045  NA 135 137 132* 132* 134*  K 3.7 4.1 3.8 3.8 3.7  CL 103 103 101 102 102  CO2 24 22 23 21 22   GLUCOSE 130* 156* 111* 97 181*  BUN 24* 16 14 16 20   CREATININE 1.04 1.55* 1.46* 1.61* 1.68*  CALCIUM 8.5 8.7 7.8* 7.5* 7.7*  MG -- -- -- -- --  PHOS -- -- -- -- --   Liver Function Tests:  Lab 04/06/12 0350 04/05/12 1410  AST 16 27  ALT 13 16  ALKPHOS 54 63  BILITOT 0.4 0.4  PROT 5.6* 7.2  ALBUMIN 2.7* 3.8    Lab 04/05/12 1410  LIPASE 26  AMYLASE --   No results found for this basename: AMMONIA:5 in the last 168 hours CBC:  Lab 04/12/12 0346 04/10/12 0355 04/08/12 0322 04/06/12 0350 04/05/12 1520  WBC 8.4 7.0 9.8 14.7* 18.7*  NEUTROABS -- -- -- -- 16.9*  HGB 11.2* 11.6* 9.5* 10.3* 11.6*  HCT 33.0* 34.7* 27.7* 30.2* 33.9*  MCV 90.2 90.8 91.7 90.7 90.2  PLT 372 255 163 185 232    Time coordinating discharge: 45 minutes  Signed:  Rhealyn Cullen  [facility] 04/12/2012, 10:15 AM

## 2012-04-12 NOTE — Plan of Care (Signed)
Problem: Discharge Progression Outcomes Goal: Discharge plan in place and appropriate Outcome: Adequate for Discharge Going home with her sister.

## 2012-04-12 NOTE — Progress Notes (Signed)
Physical Therapy Treatment Patient Details Name: Abigail [name] MRN: [id number] DOB: [birth year] Today's Date: 04/12/2012 Time: 7829-5621 PT Time Calculation (min): 40 min  PT Assessment / Plan / Recommendation Comments on Treatment Session  pt improved and is ambulatory without device on RA with sats> 90%  Pt instructed in exercise program and to stay active with frquent standing and exercise.  She acknowledges.  Feel pt with continue to improve in strength on her own as she imcreases activity. No need for folllow up PT or DME    Follow Up Recommendations  No PT follow up    Barriers to Discharge        Equipment Recommendations  None recommended by PT    Recommendations for Other Services    Frequency     Plan All goals met and education completed, patient dischaged from PT services    Precautions / Restrictions Precautions Precautions: None Restrictions Weight Bearing Restrictions: No   Pertinent Vitals/Pain O2 sats remained > 90% on RA throughout session    Mobility  Bed Mobility Bed Mobility: Supine to Sit;Rolling Right;Rolling Left;Sit to Supine Rolling Right: 7: Independent Rolling Left: 7: Independent Supine to Sit: 7: Independent Sit to Supine: 7: Independent Details for Bed Mobility Assistance: pt moves slowly as she has a history of vertigo, but is able to do all activity independently Transfers Transfers: Sit to Stand;Stand to Sit Sit to Stand: 7: Independent Stand to Sit: 7: Independent Ambulation/Gait Ambulation/Gait Assistance: 7: Independent Ambulation Distance (Feet): 400 Feet Assistive device: None Ambulation/Gait Assistance Details: pt encouragaed to have trunk extension to decrease tendency toward kyphosis Gait Pattern: Within Functional Limits Gait velocity: pt is able to change speeds, but has some dyspnea with faster speeds. General Gait Details: O2 sats > 90% on RA throughout session Stairs: Yes Stairs Assistance: 6: Modified independent  (Device/Increase time) Stair Management Technique: One rail Right;Step to pattern Number of Stairs: 10  Wheelchair Mobility Wheelchair Mobility: No    Exercises Other Exercises Other Exercises: pt issued  written program of beginning core and leg strenghtening exercise and able to perform 3-5 reps of eaach   PT Diagnosis:    PT Problem List:   PT Treatment Interventions:     PT Goals Acute Rehab PT Goals PT Goal: Ambulate - Progress: Met PT Goal: Up/Down Stairs - Progress: Met PT Goal: Perform Home Exercise Program - Progress: Met  Visit Information  Last PT Received On: 04/12/12 Assistance Needed: +1    Subjective Data  Subjective: I need to strenghten my core Patient Stated Goal: to go home without oxygen   Cognition  Overall Cognitive Status: Appears within functional limits for tasks assessed/performed Arousal/Alertness: Awake/alert Orientation Level: Appears intact for tasks assessed Behavior During Session: [facility] for tasks performed    Balance  Balance Balance Assessed: No  End of Session PT - End of Session Activity Tolerance: Patient tolerated treatment well Patient left:  (pt independent in room)   GP     Donnetta Hail 04/12/2012, 10:33 AM

## 2012-04-16 LAB — CULTURE, BLOOD (ROUTINE X 2)
Culture: NO GROWTH
Culture: NO GROWTH

## 2012-04-20 DIAGNOSIS — F411 Generalized anxiety disorder: Secondary | ICD-10-CM | POA: Diagnosis not present

## 2012-04-20 DIAGNOSIS — J158 Pneumonia due to other specified bacteria: Secondary | ICD-10-CM | POA: Diagnosis not present

## 2012-04-20 DIAGNOSIS — I10 Essential (primary) hypertension: Secondary | ICD-10-CM | POA: Diagnosis not present

## 2012-05-27 ENCOUNTER — Other Ambulatory Visit: Payer: Self-pay | Admitting: Internal Medicine

## 2012-05-27 ENCOUNTER — Ambulatory Visit
Admission: RE | Admit: 2012-05-27 | Discharge: 2012-05-27 | Disposition: A | Discharge status: Home / Self Care | Payer: Medicare Other | Source: Ambulatory Visit | Attending: Internal Medicine | Admitting: Internal Medicine

## 2012-05-27 DIAGNOSIS — I10 Essential (primary) hypertension: Secondary | ICD-10-CM | POA: Diagnosis not present

## 2012-05-27 DIAGNOSIS — J189 Pneumonia, unspecified organism: Secondary | ICD-10-CM | POA: Diagnosis not present

## 2012-05-27 DIAGNOSIS — Z23 Encounter for immunization: Secondary | ICD-10-CM | POA: Diagnosis not present

## 2012-05-27 DIAGNOSIS — N390 Urinary tract infection, site not specified: Secondary | ICD-10-CM | POA: Diagnosis not present

## 2012-05-27 DIAGNOSIS — Z79899 Other long term (current) drug therapy: Secondary | ICD-10-CM | POA: Diagnosis not present

## 2012-05-27 DIAGNOSIS — J158 Pneumonia due to other specified bacteria: Secondary | ICD-10-CM | POA: Diagnosis not present

## 2012-08-24 DIAGNOSIS — Z23 Encounter for immunization: Secondary | ICD-10-CM | POA: Diagnosis not present

## 2012-08-24 DIAGNOSIS — F411 Generalized anxiety disorder: Secondary | ICD-10-CM | POA: Diagnosis not present

## 2012-08-24 DIAGNOSIS — Z1212 Encounter for screening for malignant neoplasm of rectum: Secondary | ICD-10-CM | POA: Diagnosis not present

## 2012-08-24 DIAGNOSIS — R7309 Other abnormal glucose: Secondary | ICD-10-CM | POA: Diagnosis not present

## 2012-08-24 DIAGNOSIS — E039 Hypothyroidism, unspecified: Secondary | ICD-10-CM | POA: Diagnosis not present

## 2012-08-24 DIAGNOSIS — Z0000 Encounter for general adult medical examination without abnormal findings: Secondary | ICD-10-CM | POA: Diagnosis not present

## 2012-08-24 DIAGNOSIS — E559 Vitamin D deficiency, unspecified: Secondary | ICD-10-CM | POA: Diagnosis not present

## 2012-08-24 DIAGNOSIS — I10 Essential (primary) hypertension: Secondary | ICD-10-CM | POA: Diagnosis not present

## 2012-09-15 DIAGNOSIS — M999 Biomechanical lesion, unspecified: Secondary | ICD-10-CM | POA: Diagnosis not present

## 2012-09-15 DIAGNOSIS — M9981 Other biomechanical lesions of cervical region: Secondary | ICD-10-CM | POA: Diagnosis not present

## 2012-09-15 DIAGNOSIS — M5030 Other cervical disc degeneration, unspecified cervical region: Secondary | ICD-10-CM | POA: Diagnosis not present

## 2012-09-15 DIAGNOSIS — M5137 Other intervertebral disc degeneration, lumbosacral region: Secondary | ICD-10-CM | POA: Diagnosis not present

## 2012-09-20 DIAGNOSIS — M9981 Other biomechanical lesions of cervical region: Secondary | ICD-10-CM | POA: Diagnosis not present

## 2012-09-20 DIAGNOSIS — M999 Biomechanical lesion, unspecified: Secondary | ICD-10-CM | POA: Diagnosis not present

## 2012-09-20 DIAGNOSIS — M5137 Other intervertebral disc degeneration, lumbosacral region: Secondary | ICD-10-CM | POA: Diagnosis not present

## 2012-09-20 DIAGNOSIS — M5030 Other cervical disc degeneration, unspecified cervical region: Secondary | ICD-10-CM | POA: Diagnosis not present

## 2012-09-22 DIAGNOSIS — M5030 Other cervical disc degeneration, unspecified cervical region: Secondary | ICD-10-CM | POA: Diagnosis not present

## 2012-09-22 DIAGNOSIS — M5137 Other intervertebral disc degeneration, lumbosacral region: Secondary | ICD-10-CM | POA: Diagnosis not present

## 2012-09-22 DIAGNOSIS — M999 Biomechanical lesion, unspecified: Secondary | ICD-10-CM | POA: Diagnosis not present

## 2012-09-22 DIAGNOSIS — M9981 Other biomechanical lesions of cervical region: Secondary | ICD-10-CM | POA: Diagnosis not present

## 2012-09-23 DIAGNOSIS — M5137 Other intervertebral disc degeneration, lumbosacral region: Secondary | ICD-10-CM | POA: Diagnosis not present

## 2012-09-23 DIAGNOSIS — M5030 Other cervical disc degeneration, unspecified cervical region: Secondary | ICD-10-CM | POA: Diagnosis not present

## 2012-09-23 DIAGNOSIS — M999 Biomechanical lesion, unspecified: Secondary | ICD-10-CM | POA: Diagnosis not present

## 2012-09-23 DIAGNOSIS — M9981 Other biomechanical lesions of cervical region: Secondary | ICD-10-CM | POA: Diagnosis not present

## 2012-09-27 DIAGNOSIS — M999 Biomechanical lesion, unspecified: Secondary | ICD-10-CM | POA: Diagnosis not present

## 2012-09-27 DIAGNOSIS — M5137 Other intervertebral disc degeneration, lumbosacral region: Secondary | ICD-10-CM | POA: Diagnosis not present

## 2012-09-27 DIAGNOSIS — M9981 Other biomechanical lesions of cervical region: Secondary | ICD-10-CM | POA: Diagnosis not present

## 2012-09-27 DIAGNOSIS — M5030 Other cervical disc degeneration, unspecified cervical region: Secondary | ICD-10-CM | POA: Diagnosis not present

## 2012-09-29 DIAGNOSIS — M9981 Other biomechanical lesions of cervical region: Secondary | ICD-10-CM | POA: Diagnosis not present

## 2012-09-29 DIAGNOSIS — M999 Biomechanical lesion, unspecified: Secondary | ICD-10-CM | POA: Diagnosis not present

## 2012-09-29 DIAGNOSIS — M5137 Other intervertebral disc degeneration, lumbosacral region: Secondary | ICD-10-CM | POA: Diagnosis not present

## 2012-09-29 DIAGNOSIS — M5030 Other cervical disc degeneration, unspecified cervical region: Secondary | ICD-10-CM | POA: Diagnosis not present

## 2012-09-30 DIAGNOSIS — M999 Biomechanical lesion, unspecified: Secondary | ICD-10-CM | POA: Diagnosis not present

## 2012-09-30 DIAGNOSIS — M5030 Other cervical disc degeneration, unspecified cervical region: Secondary | ICD-10-CM | POA: Diagnosis not present

## 2012-09-30 DIAGNOSIS — M5137 Other intervertebral disc degeneration, lumbosacral region: Secondary | ICD-10-CM | POA: Diagnosis not present

## 2012-09-30 DIAGNOSIS — M9981 Other biomechanical lesions of cervical region: Secondary | ICD-10-CM | POA: Diagnosis not present

## 2012-10-04 DIAGNOSIS — M9981 Other biomechanical lesions of cervical region: Secondary | ICD-10-CM | POA: Diagnosis not present

## 2012-10-04 DIAGNOSIS — M5030 Other cervical disc degeneration, unspecified cervical region: Secondary | ICD-10-CM | POA: Diagnosis not present

## 2012-10-04 DIAGNOSIS — M999 Biomechanical lesion, unspecified: Secondary | ICD-10-CM | POA: Diagnosis not present

## 2012-10-04 DIAGNOSIS — M5137 Other intervertebral disc degeneration, lumbosacral region: Secondary | ICD-10-CM | POA: Diagnosis not present

## 2012-10-11 DIAGNOSIS — M5137 Other intervertebral disc degeneration, lumbosacral region: Secondary | ICD-10-CM | POA: Diagnosis not present

## 2012-10-11 DIAGNOSIS — M999 Biomechanical lesion, unspecified: Secondary | ICD-10-CM | POA: Diagnosis not present

## 2012-10-11 DIAGNOSIS — M9981 Other biomechanical lesions of cervical region: Secondary | ICD-10-CM | POA: Diagnosis not present

## 2012-10-11 DIAGNOSIS — M5030 Other cervical disc degeneration, unspecified cervical region: Secondary | ICD-10-CM | POA: Diagnosis not present

## 2012-10-12 DIAGNOSIS — M5030 Other cervical disc degeneration, unspecified cervical region: Secondary | ICD-10-CM | POA: Diagnosis not present

## 2012-10-12 DIAGNOSIS — M5137 Other intervertebral disc degeneration, lumbosacral region: Secondary | ICD-10-CM | POA: Diagnosis not present

## 2012-10-12 DIAGNOSIS — M9981 Other biomechanical lesions of cervical region: Secondary | ICD-10-CM | POA: Diagnosis not present

## 2012-10-12 DIAGNOSIS — M999 Biomechanical lesion, unspecified: Secondary | ICD-10-CM | POA: Diagnosis not present

## 2012-10-13 DIAGNOSIS — M5030 Other cervical disc degeneration, unspecified cervical region: Secondary | ICD-10-CM | POA: Diagnosis not present

## 2012-10-13 DIAGNOSIS — M999 Biomechanical lesion, unspecified: Secondary | ICD-10-CM | POA: Diagnosis not present

## 2012-10-13 DIAGNOSIS — M9981 Other biomechanical lesions of cervical region: Secondary | ICD-10-CM | POA: Diagnosis not present

## 2012-10-13 DIAGNOSIS — M5137 Other intervertebral disc degeneration, lumbosacral region: Secondary | ICD-10-CM | POA: Diagnosis not present

## 2012-10-14 DIAGNOSIS — M999 Biomechanical lesion, unspecified: Secondary | ICD-10-CM | POA: Diagnosis not present

## 2012-10-14 DIAGNOSIS — M5030 Other cervical disc degeneration, unspecified cervical region: Secondary | ICD-10-CM | POA: Diagnosis not present

## 2012-10-14 DIAGNOSIS — M5137 Other intervertebral disc degeneration, lumbosacral region: Secondary | ICD-10-CM | POA: Diagnosis not present

## 2012-10-14 DIAGNOSIS — M9981 Other biomechanical lesions of cervical region: Secondary | ICD-10-CM | POA: Diagnosis not present

## 2012-10-27 ENCOUNTER — Ambulatory Visit (INDEPENDENT_AMBULATORY_CARE_PROVIDER_SITE_OTHER): Payer: Medicare Other | Admitting: Family Medicine

## 2012-10-27 VITALS — BP 170/100 | HR 75 | Temp 98.1°F | Resp 16 | Ht 63.5 in | Wt 166.0 lb

## 2012-10-27 DIAGNOSIS — N952 Postmenopausal atrophic vaginitis: Secondary | ICD-10-CM

## 2012-10-27 DIAGNOSIS — N390 Urinary tract infection, site not specified: Secondary | ICD-10-CM

## 2012-10-27 LAB — POCT UA - MICROSCOPIC ONLY
Casts, Ur, LPF, POC: NEGATIVE
Crystals, Ur, HPF, POC: NEGATIVE
Epithelial cells, urine per micros: NEGATIVE
Mucus, UA: NEGATIVE
Yeast, UA: NEGATIVE

## 2012-10-27 LAB — POCT URINALYSIS DIPSTICK
Bilirubin, UA: NEGATIVE
Blood, UA: NEGATIVE
Glucose, UA: NEGATIVE
Ketones, UA: NEGATIVE
Leukocytes, UA: NEGATIVE
Nitrite, UA: NEGATIVE
Protein, UA: NEGATIVE
Spec Grav, UA: 1.015
Urobilinogen, UA: 0.2
pH, UA: 7

## 2012-10-27 MED ORDER — CIPROFLOXACIN HCL 500 MG PO TABS: 500.0000 mg | ORAL_TABLET | Freq: Two times a day (BID) | ORAL | Status: DC

## 2012-10-27 MED ORDER — ESTROGENS, CONJUGATED 0.625 MG/GM VA CREA: TOPICAL_CREAM | Freq: Every day | VAGINAL | Status: DC

## 2012-10-27 NOTE — Patient Instructions (Addendum)
I think the reason you are having discomfort is potentially vaginal atrophy. I would like you to try this cream every night. I'm also going to give you prescription for Cipro. You can use this if you think he needed. Please come back again in 2 weeks if not feeling better or get worse.

## 2012-10-27 NOTE — Progress Notes (Signed)
Patient is a 77 year old female who is coming in with painful urination with increased frequency. Patient did have a hospital admission back in August for a pneumonia, bacteremia and UTI. Patient was in the hospital for one week. Patient states that she is starting to feel a little fatigue like she did previously been started having more pain in this area. Patient denies any shortness of breath, fevers, or chills. Patient denies any odor to her urine. Patient is unable to do all her activities of daily living continues to work a regular basis. Patient states that she has some mild discomfort but no significant pain. Patient denies any back pain.  Past medical surgical family history reviewed. Patient's urine cultures in the hospital was sensitive to Cipro and grew out Escherichia coli.  Patient urinalysis today is unremarkable. No leukocytes, no nitrates, no white blood cells red blood cells.  Physical exam Blood pressure 170/100, pulse 75, temperature 98.1 F (36.7 C), temperature source Oral, resp. rate 16, height 5' 3.5" (1.613 m), weight 166 lb (75.297 kg), SpO2 97.00%. General: No apparent distress alert and oriented x3 mood and affect normal. Abdominal exam: Positive nontender nondistended. Patient has no CVA tenderness. Vaginal exam: Patient is a vaginal atrophy with mild irritation around the urethral area.  A:  Vaginal atrophy Discussed cream to try daily for 1 week then three times a week thereafter.  Discussed wait and see prescription for potential UTI secondary to the suddenness of symptoms from last hospitalization.

## 2013-02-01 DIAGNOSIS — I129 Hypertensive chronic kidney disease with stage 1 through stage 4 chronic kidney disease, or unspecified chronic kidney disease: Secondary | ICD-10-CM | POA: Diagnosis not present

## 2013-02-01 DIAGNOSIS — E785 Hyperlipidemia, unspecified: Secondary | ICD-10-CM | POA: Diagnosis not present

## 2013-02-01 DIAGNOSIS — R7309 Other abnormal glucose: Secondary | ICD-10-CM | POA: Diagnosis not present

## 2013-02-01 DIAGNOSIS — Z79899 Other long term (current) drug therapy: Secondary | ICD-10-CM | POA: Diagnosis not present

## 2013-02-01 DIAGNOSIS — E039 Hypothyroidism, unspecified: Secondary | ICD-10-CM | POA: Diagnosis not present

## 2013-04-26 DIAGNOSIS — M810 Age-related osteoporosis without current pathological fracture: Secondary | ICD-10-CM | POA: Diagnosis not present

## 2013-08-10 DIAGNOSIS — Z79899 Other long term (current) drug therapy: Secondary | ICD-10-CM | POA: Diagnosis not present

## 2013-08-10 DIAGNOSIS — E785 Hyperlipidemia, unspecified: Secondary | ICD-10-CM | POA: Diagnosis not present

## 2013-08-10 DIAGNOSIS — IMO0002 Reserved for concepts with insufficient information to code with codable children: Secondary | ICD-10-CM | POA: Diagnosis not present

## 2013-08-10 DIAGNOSIS — M1710 Unilateral primary osteoarthritis, unspecified knee: Secondary | ICD-10-CM | POA: Diagnosis not present

## 2013-08-10 DIAGNOSIS — E039 Hypothyroidism, unspecified: Secondary | ICD-10-CM | POA: Diagnosis not present

## 2013-08-10 DIAGNOSIS — E559 Vitamin D deficiency, unspecified: Secondary | ICD-10-CM | POA: Diagnosis not present

## 2013-08-10 DIAGNOSIS — R7309 Other abnormal glucose: Secondary | ICD-10-CM | POA: Diagnosis not present

## 2013-08-10 DIAGNOSIS — I129 Hypertensive chronic kidney disease with stage 1 through stage 4 chronic kidney disease, or unspecified chronic kidney disease: Secondary | ICD-10-CM | POA: Diagnosis not present

## 2013-08-10 DIAGNOSIS — N183 Chronic kidney disease, stage 3 unspecified: Secondary | ICD-10-CM | POA: Diagnosis not present

## 2013-08-10 DIAGNOSIS — Z0000 Encounter for general adult medical examination without abnormal findings: Secondary | ICD-10-CM | POA: Diagnosis not present

## 2013-08-22 ENCOUNTER — Ambulatory Visit (INDEPENDENT_AMBULATORY_CARE_PROVIDER_SITE_OTHER): Payer: Medicare Other | Admitting: Emergency Medicine

## 2013-08-22 VITALS — BP 186/80 | HR 93 | Temp 101.3°F | Resp 18 | Ht 63.5 in | Wt 156.0 lb

## 2013-08-22 DIAGNOSIS — J111 Influenza due to unidentified influenza virus with other respiratory manifestations: Secondary | ICD-10-CM | POA: Diagnosis not present

## 2013-08-22 MED ORDER — GUAIFENESIN-DM 100-10 MG/5ML PO SYRP: 5.0000 mL | ORAL_SOLUTION | ORAL | Status: DC | PRN

## 2013-08-22 MED ORDER — OSELTAMIVIR PHOSPHATE 75 MG PO CAPS: 75.0000 mg | ORAL_CAPSULE | Freq: Two times a day (BID) | ORAL | Status: DC

## 2013-08-22 NOTE — Progress Notes (Signed)
[facility] [address], [place] [address] [phone]  Date:  08/22/2013   Name:  Abigail [name]   DOB:  1936-02-14   MRN:  [id number]  PCP:  Alva Garnet., MD    Chief Complaint: Fever and Cough   History of Present Illness:  Abigail [name] is a 77 y.o. very pleasant female patient who presents with the following:  Ill since last night with sudden onset of cough and malaise and fever. Cough is productive of scant mucoid sputum.  Has nasal drainage that is clear in color.  No wheezing but has exertional shortness of breath.  No nausea or vomiting.  No stool change or rash.  No sore throat.  Feels miserable.  No improvement with over the counter medications or other home remedies. Denies other complaint or health concern today.   Patient Active Problem List   Diagnosis Date Noted  . CAP (community acquired pneumonia) 04/12/2012  . Bacteremia 04/08/2012  . UTI (lower urinary tract infection) 04/05/2012  . Nausea and vomiting 04/05/2012  . LLQ abdominal pain 04/05/2012  . Acute renal failure 04/05/2012  . Hyponatremia 04/05/2012    Past Medical History  Diagnosis Date  . Diverticulitis   . Hypothyroid   . Hypertension   . Carotid artery occlusion   . Complication of anesthesia     hard to wake up per pt    Past Surgical History  Procedure Laterality Date  . Back surgery  1980  . Elbow surgery      left  . Spine surgery    . Fracture surgery      History  Substance Use Topics  . Smoking status: Former Smoker    Quit date: 04/05/1986  . Smokeless tobacco: Never Used  . Alcohol Use: No    No family history on file.  Allergies  Allergen Reactions  . Codeine Nausea And Vomiting    Medication list has been reviewed and updated.  Current Outpatient Prescriptions on File Prior to Visit  Medication Sig Dispense Refill  . aspirin 81 MG tablet Take 81 mg by mouth daily.      . cholecalciferol (VITAMIN D) 1000 UNITS tablet Take 1,000  Units by mouth daily.      [name] Kitchen co-enzyme Q-10 30 MG capsule Take 30 mg by mouth 3 (three) times daily.      [name] Kitchen diltiazem (DILACOR XR) 180 MG 24 hr capsule Take 180 mg by mouth daily.      [name] Kitchen levothyroxine (SYNTHROID, LEVOTHROID) 175 MCG tablet Take 175 mcg by mouth daily.      [name] Kitchen levothyroxine (SYNTHROID, LEVOTHROID) 200 MCG tablet Take 200 mcg by mouth daily. Pt alternates between 175 mcg and the 200 mcg daily      . Multiple Vitamin (MULTIVITAMIN) tablet Take 1 tablet by mouth daily.      . pravastatin (PRAVACHOL) 40 MG tablet Take 40 mg by mouth daily.      . Vitamin D, Ergocalciferol, (DRISDOL) 50000 UNITS CAPS Take 50,000 Units by mouth.       No current facility-administered medications on file prior to visit.    Review of Systems:  As per HPI, otherwise negative.    Physical Examination: Filed Vitals:   08/22/13 1528  BP: 186/80  Pulse: 93  Temp: 101.3 F (38.5 C)  Resp: 18   Filed Vitals:   08/22/13 1528  Height: 5' 3.5" (1.613 m)  Weight: 156 lb (70.761 kg)   Body mass index  is 27.2 kg/(m^2). Ideal Body Weight: Weight in (lb) to have BMI = 25: 143.1  GEN: WDWN, NAD, ill appearing, A & O x 3 HEENT: Atraumatic, Normocephalic. Neck supple. No masses, No LAD. Ears and Nose: No external deformity. CV: RRR, No M/G/R. No JVD. No thrill. No extra heart sounds. PULM: CTA B, no wheezes, crackles, rhonchi. No retractions. No resp. distress. No accessory muscle use. ABD: S, NT, ND, +BS. No rebound. No HSM. EXTR: No c/c/e NEURO Normal gait.  PSYCH: Normally interactive. Conversant. Not depressed or anxious appearing.  Calm demeanor.    Assessment and Plan: Influenza tamiflu Phen c cod   Signed,  Phillips Odor, MD

## 2013-08-22 NOTE — Patient Instructions (Signed)

## 2013-08-24 ENCOUNTER — Telehealth: Payer: Self-pay

## 2013-08-24 NOTE — Telephone Encounter (Signed)
Called her, left message for her to call me back.  

## 2013-08-24 NOTE — Telephone Encounter (Signed)
PT STATES SHE WAS DIAGNOSED WITH THE FLU AND WAS FEELING BETTER BUT WOKE UP THIS MORNING SORE AND HER RIBS HURT, COUGHING UP YELLOWISH STUFF ALSO PLEASE CALL [phone]   [facility] ON [address]

## 2013-08-24 NOTE — Telephone Encounter (Signed)
Pt reports that she feels a little weaker this morning and coughed up some mucus w/a yellowish brown color only this morn but not since. She states she was feeling a lot better yesterday after taking Tamiflu. Pt reports no chills although hasn't taken temp., no further productive cough. Advised pt that if she feels she has taken a turn for the worse again, fever returns, productive cough w/colored mucus returns or SOB, she needs to RTC to be eval for secondary infection. Advised lots of water and rest. Pt agreed and gave her hours for today and tomorrow.

## 2014-04-18 ENCOUNTER — Ambulatory Visit (INDEPENDENT_AMBULATORY_CARE_PROVIDER_SITE_OTHER): Payer: Medicare Other | Admitting: Family Medicine

## 2014-04-18 VITALS — BP 140/58 | HR 73 | Temp 98.2°F | Resp 16 | Ht 63.0 in | Wt 160.2 lb

## 2014-04-18 DIAGNOSIS — R197 Diarrhea, unspecified: Secondary | ICD-10-CM

## 2014-04-18 DIAGNOSIS — R8281 Pyuria: Secondary | ICD-10-CM

## 2014-04-18 DIAGNOSIS — R82998 Other abnormal findings in urine: Secondary | ICD-10-CM

## 2014-04-18 DIAGNOSIS — R112 Nausea with vomiting, unspecified: Secondary | ICD-10-CM

## 2014-04-18 LAB — POCT CBC
Granulocyte percent: 92.8 %G — AB (ref 37–80)
HCT, POC: 44.9 % (ref 37.7–47.9)
Hemoglobin: 14.7 g/dL (ref 12.2–16.2)
Lymph, poc: 0.6 (ref 0.6–3.4)
MCH, POC: 30.8 pg (ref 27–31.2)
MCHC: 32.8 g/dL (ref 31.8–35.4)
MCV: 93.9 fL (ref 80–97)
MID (cbc): 0.2 (ref 0–0.9)
MPV: 7.3 fL (ref 0–99.8)
POC Granulocyte: 10 — AB (ref 2–6.9)
POC LYMPH PERCENT: 5.4 %L — AB (ref 10–50)
POC MID %: 1.8 %M (ref 0–12)
Platelet Count, POC: 232 10*3/uL (ref 142–424)
RBC: 4.78 M/uL (ref 4.04–5.48)
RDW, POC: 13.7 %
WBC: 10.8 10*3/uL — AB (ref 4.6–10.2)

## 2014-04-18 LAB — POCT URINALYSIS DIPSTICK
Bilirubin, UA: NEGATIVE
Blood, UA: NEGATIVE
Glucose, UA: NEGATIVE
Ketones, UA: NEGATIVE
Nitrite, UA: POSITIVE
Protein, UA: NEGATIVE
Spec Grav, UA: 1.01
Urobilinogen, UA: 0.2
pH, UA: 6.5

## 2014-04-18 MED ORDER — CIPROFLOXACIN HCL 250 MG PO TABS: 250.0000 mg | ORAL_TABLET | Freq: Two times a day (BID) | ORAL | Status: DC

## 2014-04-18 MED ORDER — ONDANSETRON 8 MG PO TBDP: 8.0000 mg | ORAL_TABLET | Freq: Three times a day (TID) | ORAL | Status: DC | PRN

## 2014-04-18 NOTE — Progress Notes (Signed)
This is a 78 year old woman who is still working. 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  She was in her usual state of health until yesterday when she developed florid diarrhea lasting till midnight. At that time she took an Imodium and subsequently, early this morning around 1:30, patient began vomiting. She feels exhausted at this point having slept only an hour and a half.  She has been able to keep down eggs and fluids. She denies cramps, blood in stool, blood in emesis, difficulty voiding.  Objective: Patient appears tired but is articulate  HEENT: Unremarkable Neck: Supple no adenopathy Chest: Clear Heart: Regular at a rate of approximately 66. Her blood pressure stable whether she's lying sitting or standing and around 125-138/50-60. Extremities: Good pedal pulses, no edema Skin: No rash  Abdomen: Soft, nontender with active bowel sounds, no HSM or masses, no guarding or rebound.  Results for orders placed in visit on 04/18/14  POCT CBC      Result Value Ref Range   WBC 10.8 (*) 4.6 - 10.2 K/uL   Lymph, poc 0.6  0.6 - 3.4   POC LYMPH PERCENT 5.4 (*) 10 - 50 %L   MID (cbc) 0.2  0 - 0.9   POC MID % 1.8  0 - 12 %M   POC Granulocyte 10.0 (*) 2 - 6.9   Granulocyte percent 92.8 (*) 37 - 80 %G   RBC 4.78  4.04 - 5.48 M/uL   Hemoglobin 14.7  12.2 - 16.2 g/dL   HCT, POC 44.9  37.7 - 47.9 %   MCV 93.9  80 - 97 fL   MCH, POC 30.8  27 - 31.2 pg   MCHC 32.8  31.8 - 35.4 g/dL   RDW, POC 13.7     Platelet Count, POC 232  142 - 424 K/uL   MPV 7.3  0 - 99.8 fL  POCT URINALYSIS DIPSTICK      Result Value Ref Range   Color, UA yellow     Clarity, UA clear     Glucose, UA neg     Bilirubin, UA neg     Ketones, UA neg     Spec Grav, UA 1.010     Blood, UA neg     pH, UA 6.5     Protein, UA neg     Urobilinogen, UA 0.2     Nitrite, UA positive     Leukocytes, UA small (1+)     I suspect patient has a  urinary tract infection which may have led to some of her gastrointestinal symptoms. It's also possible she has a virus the kicked this whole thing off.  Nausea and vomiting, vomiting of unspecified type - Plan: POCT CBC, POCT urinalysis dipstick, Comprehensive metabolic panel, ondansetron (ZOFRAN ODT) 8 MG disintegrating tablet  Diarrhea - Plan: POCT CBC, POCT urinalysis dipstick, Comprehensive metabolic panel  Pyuria - Plan: Urine culture, ciprofloxacin (CIPRO) 250 MG tablet  Signed, Robyn Haber, MD

## 2014-04-18 NOTE — Patient Instructions (Signed)
Viral Gastroenteritis Viral gastroenteritis is also known as stomach flu. This condition affects the stomach and intestinal tract. It can cause sudden diarrhea and vomiting. The illness typically lasts 3 to 8 days. Most people develop an immune response that eventually gets rid of the virus. While this natural response develops, the virus can make you quite ill. CAUSES  Many different viruses can cause gastroenteritis, such as rotavirus or noroviruses. You can catch one of these viruses by consuming contaminated food or water. You may also catch a virus by sharing utensils or other personal items with an infected person or by touching a contaminated surface. SYMPTOMS  The most common symptoms are diarrhea and vomiting. These problems can cause a severe loss of body fluids (dehydration) and a body salt (electrolyte) imbalance. Other symptoms may include:  Fever.  Headache.  Fatigue.  Abdominal pain. DIAGNOSIS  Your caregiver can usually diagnose viral gastroenteritis based on your symptoms and a physical exam. A stool sample may also be taken to test for the presence of viruses or other infections. TREATMENT  This illness typically goes away on its own. Treatments are aimed at rehydration. The most serious cases of viral gastroenteritis involve vomiting so severely that you are not able to keep fluids down. In these cases, fluids must be given through an intravenous line (IV). HOME CARE INSTRUCTIONS   Drink enough fluids to keep your urine clear or pale yellow. Drink small amounts of fluids frequently and increase the amounts as tolerated.  Ask your caregiver for specific rehydration instructions.  Avoid:  Foods high in sugar.  Alcohol.  Carbonated drinks.  Tobacco.  Juice.  Caffeine drinks.  Extremely hot or cold fluids.  Fatty, greasy foods.  Too much intake of anything at one time.  Dairy products until 24 to 48 hours after diarrhea stops.  You may consume probiotics.  Probiotics are active cultures of beneficial bacteria. They may lessen the amount and number of diarrheal stools in adults. Probiotics can be found in yogurt with active cultures and in supplements.  Wash your hands well to avoid spreading the virus.  Only take over-the-counter or prescription medicines for pain, discomfort, or fever as directed by your caregiver. Do not give aspirin to children. Antidiarrheal medicines are not recommended.  Ask your caregiver if you should continue to take your regular prescribed and over-the-counter medicines.  Keep all follow-up appointments as directed by your caregiver. SEEK IMMEDIATE MEDICAL CARE IF:   You are unable to keep fluids down.  You do not urinate at least once every 6 to 8 hours.  You develop shortness of breath.  You notice blood in your stool or vomit. This may look like coffee grounds.  You have abdominal pain that increases or is concentrated in one small area (localized).  You have persistent vomiting or diarrhea.  You have a fever.  The patient is a child younger than 3 months, and he or she has a fever.  The patient is a child older than 3 months, and he or she has a fever and persistent symptoms.  The patient is a child older than 3 months, and he or she has a fever and symptoms suddenly get worse.  The patient is a baby, and he or she has no tears when crying. MAKE SURE YOU:   Understand these instructions.  Will watch your condition.  Will get help right away if you are not doing well or get worse. Document Released: 08/11/2005 Document Revised: 11/03/2011 Document Reviewed: 05/28/2011   [employer] Patient Information 2015 [place]. This information is not intended to replace advice given to you by your health care provider. Make sure you discuss any questions you have with your health care provider. Urinary Tract Infection Urinary tract infections (UTIs) can develop anywhere along your urinary tract. Your  urinary tract is your body's drainage system for removing wastes and extra water. Your urinary tract includes two kidneys, two ureters, a bladder, and a urethra. Your kidneys are a pair of bean-shaped organs. Each kidney is about the size of your fist. They are located below your ribs, one on each side of your spine. CAUSES Infections are caused by microbes, which are microscopic organisms, including fungi, viruses, and bacteria. These organisms are so small that they can only be seen through a microscope. Bacteria are the microbes that most commonly cause UTIs. SYMPTOMS  Symptoms of UTIs may vary by age and gender of the patient and by the location of the infection. Symptoms in young women typically include a frequent and intense urge to urinate and a painful, burning feeling in the bladder or urethra during urination. Older women and men are more likely to be tired, shaky, and weak and have muscle aches and abdominal pain. A fever may mean the infection is in your kidneys. Other symptoms of a kidney infection include pain in your back or sides below the ribs, nausea, and vomiting. DIAGNOSIS To diagnose a UTI, your caregiver will ask you about your symptoms. Your caregiver also will ask to provide a urine sample. The urine sample will be tested for bacteria and white blood cells. White blood cells are made by your body to help fight infection. TREATMENT  Typically, UTIs can be treated with medication. Because most UTIs are caused by a bacterial infection, they usually can be treated with the use of antibiotics. The choice of antibiotic and length of treatment depend on your symptoms and the type of bacteria causing your infection. HOME CARE INSTRUCTIONS  If you were prescribed antibiotics, take them exactly as your caregiver instructs you. Finish the medication even if you feel better after you have only taken some of the medication.  Drink enough water and fluids to keep your urine clear or pale  yellow.  Avoid caffeine, tea, and carbonated beverages. They tend to irritate your bladder.  Empty your bladder often. Avoid holding urine for long periods of time.  Empty your bladder before and after sexual intercourse.  After a bowel movement, women should cleanse from front to back. Use each tissue only once. SEEK MEDICAL CARE IF:   You have back pain.  You develop a fever.  Your symptoms do not begin to resolve within 3 days. SEEK IMMEDIATE MEDICAL CARE IF:   You have severe back pain or lower abdominal pain.  You develop chills.  You have nausea or vomiting.  You have continued burning or discomfort with urination. MAKE SURE YOU:   Understand these instructions.  Will watch your condition.  Will get help right away if you are not doing well or get worse. Document Released: 05/21/2005 Document Revised: 02/10/2012 Document Reviewed: 09/19/2011 [facility] Patient Information 2015 [place], [place]. This information is not intended to replace advice given to you by your health care provider. Make sure you discuss any questions you have with your health care provider.

## 2014-04-19 LAB — COMPREHENSIVE METABOLIC PANEL
ALT: 14 U/L (ref 0–35)
AST: 16 U/L (ref 0–37)
Albumin: 4.4 g/dL (ref 3.5–5.2)
Alkaline Phosphatase: 75 U/L (ref 39–117)
BUN: 29 mg/dL — ABNORMAL HIGH (ref 6–23)
CO2: 26 mEq/L (ref 19–32)
Calcium: 9 mg/dL (ref 8.4–10.5)
Chloride: 95 mEq/L — ABNORMAL LOW (ref 96–112)
Creat: 1.08 mg/dL (ref 0.50–1.10)
Glucose, Bld: 101 mg/dL — ABNORMAL HIGH (ref 70–99)
Potassium: 3.8 mEq/L (ref 3.5–5.3)
Sodium: 134 mEq/L — ABNORMAL LOW (ref 135–145)
Total Bilirubin: 0.6 mg/dL (ref 0.2–1.2)
Total Protein: 7.4 g/dL (ref 6.0–8.3)

## 2014-04-21 LAB — URINE CULTURE: Colony Count: >=100000

## 2014-10-30 DIAGNOSIS — I129 Hypertensive chronic kidney disease with stage 1 through stage 4 chronic kidney disease, or unspecified chronic kidney disease: Secondary | ICD-10-CM | POA: Diagnosis not present

## 2014-10-30 DIAGNOSIS — Z79899 Other long term (current) drug therapy: Secondary | ICD-10-CM | POA: Diagnosis not present

## 2014-10-30 DIAGNOSIS — E559 Vitamin D deficiency, unspecified: Secondary | ICD-10-CM | POA: Diagnosis not present

## 2014-10-30 DIAGNOSIS — Z0000 Encounter for general adult medical examination without abnormal findings: Secondary | ICD-10-CM | POA: Diagnosis not present

## 2014-10-30 DIAGNOSIS — E038 Other specified hypothyroidism: Secondary | ICD-10-CM | POA: Diagnosis not present

## 2014-10-30 DIAGNOSIS — N182 Chronic kidney disease, stage 2 (mild): Secondary | ICD-10-CM | POA: Diagnosis not present

## 2015-05-13 ENCOUNTER — Inpatient Hospital Stay (HOSPITAL_COMMUNITY)
Admission: AD | Admit: 2015-05-13 | Discharge: 2015-05-13 | Disposition: A | Discharge status: Home / Self Care | Payer: Medicare Other | Source: Ambulatory Visit | Attending: Family Medicine | Admitting: Family Medicine

## 2015-05-13 ENCOUNTER — Inpatient Hospital Stay (HOSPITAL_COMMUNITY)
Admission: AD | Admit: 2015-05-13 | Discharge: 2015-05-13 | Discharge status: Absent without leave | Payer: Self-pay | Attending: Family Medicine | Admitting: Family Medicine

## 2015-05-13 ENCOUNTER — Encounter (HOSPITAL_COMMUNITY): Payer: Self-pay | Admitting: Advanced Practice Midwife

## 2015-05-13 ENCOUNTER — Ambulatory Visit (INDEPENDENT_AMBULATORY_CARE_PROVIDER_SITE_OTHER): Payer: Medicare Other | Admitting: Emergency Medicine

## 2015-05-13 VITALS — BP 152/66 | HR 72 | Temp 98.0°F | Resp 16 | Ht 63.0 in | Wt 161.0 lb

## 2015-05-13 DIAGNOSIS — R102 Pelvic and perineal pain: Secondary | ICD-10-CM | POA: Diagnosis present

## 2015-05-13 DIAGNOSIS — N751 Abscess of Bartholin's gland: Secondary | ICD-10-CM

## 2015-05-13 DIAGNOSIS — N8110 Cystocele, unspecified: Secondary | ICD-10-CM | POA: Diagnosis not present

## 2015-05-13 DIAGNOSIS — N814 Uterovaginal prolapse, unspecified: Secondary | ICD-10-CM | POA: Diagnosis not present

## 2015-05-13 DIAGNOSIS — N819 Female genital prolapse, unspecified: Secondary | ICD-10-CM

## 2015-05-13 DIAGNOSIS — N750 Cyst of Bartholin's gland: Secondary | ICD-10-CM

## 2015-05-13 DIAGNOSIS — Z87891 Personal history of nicotine dependence: Secondary | ICD-10-CM | POA: Insufficient documentation

## 2015-05-13 DIAGNOSIS — I10 Essential (primary) hypertension: Secondary | ICD-10-CM | POA: Insufficient documentation

## 2015-05-13 NOTE — MAU Note (Addendum)
Pt blood pressure at discharge was 193/63 and retaken in different arms.  Reported to N. Mare Ferrari, NP.  Pt will rest for 15 minutes and then recheck.  Pt did take her HTN medication this morning but has been in a lot of pain.  1336 repeated her BP (167/55) and sids basin and info given.  Pt instructed to check BP bid and call primary MD if it remains elevated.  Pt verbalized understanding.

## 2015-05-13 NOTE — Discharge Instructions (Signed)
Bartholin's Cyst or Abscess Bartholin's glands are small glands located within the folds of skin (labia) along the sides of the lower opening of the vagina (birth canal). A cyst may develop when the duct of the gland becomes blocked. When this happens, fluid that accumulates within the cyst can become infected. This is known as an abscess. The Bartholin gland produces a mucous fluid to lubricate the outside of the vagina during sexual intercourse. SYMPTOMS   Patients with a small cyst may not have any symptoms.  Mild discomfort to severe pain depending on the size of the cyst and if it is infected (abscess).  Pain, redness, and swelling around the lower opening of the vagina.  Painful intercourse.  Pressure in the perineal area.  Swelling of the lips of the vagina (labia).  The cyst or abscess can be on one side or both sides of the vagina. DIAGNOSIS   A large swelling is seen in the lower vagina area by your caregiver.  Painful to touch.  Redness and pain, if it is an abscess. TREATMENT   Sometimes the cyst will go away on its own.  Apply warm wet compresses to the area or take hot sitz baths several times a day.  An incision to drain the cyst or abscess with local anesthesia.  Culture the pus, if it is an abscess.  Antibiotic treatment, if it is an abscess.  Cut open the gland and suture the edges to make the opening of the gland bigger (marsupialization).  Remove the whole gland if the cyst or abscess returns. PREVENTION   Practice good hygiene.  Clean the vaginal area with a mild soap and soft cloth when bathing.  Do not rub hard in the vaginal area when bathing.  Protect the crotch area with a padded cushion if you take long bike rides or ride horses.  Be sure you are well lubricated when you have sexual intercourse. HOME CARE INSTRUCTIONS   If your cyst or abscess was opened, a small piece of gauze, or a drain, may have been placed in the wound to allow  drainage. Do not remove this gauze or drain unless directed by your caregiver.  Wear feminine pads, not tampons, as needed for any drainage or bleeding.  If antibiotics were prescribed, take them exactly as directed. Finish the entire course.  Only take over-the-counter or prescription medicines for pain, discomfort, or fever as directed by your caregiver. SEEK IMMEDIATE MEDICAL CARE IF:   You have an increase in pain, redness, swelling, or drainage.  You have bleeding from the wound which results in the use of more than the number of pads suggested by your caregiver in 24 hours.  You have chills.  You have a fever.  You develop any new problems (symptoms) or aggravation of your existing condition. MAKE SURE YOU:   Understand these instructions.  Will watch your condition.  Will get help right away if you are not doing well or get worse. Document Released: 08/11/2005 Document Revised: 11/03/2011 Document Reviewed: 03/29/2008 [facility] Patient Information 2015 [place], [place]. This information is not intended to replace advice given to you by your health care provider. Make sure you discuss any questions you have with your health care provider.

## 2015-05-13 NOTE — MAU Note (Addendum)
Pt came from [facility] after being dx with Bartholin's cyst x 3 days  and uterine prolapse x 20 years.  Pt thought it was her bladder coming out.  Pt states she has been experiencing pinkish mucous vaginal discharge for a couple of months.

## 2015-05-13 NOTE — Progress Notes (Signed)
Subjective:  Patient ID: Abigail [name], female    DOB: Mar 10, 1936  Age: 79 y.o. MRN: [id number]  CC: Pelvic Pain and Vaginal Pain   HPI Abigail [name] presents  with pain in her introitus. She said that she has a Bartholin's gland cyst. Addition she's been experiencing pain with that for for 5 days. She denies any fever chills no vaginal discharge. No dysuria urgency or frequency. She's had surgical treatment of bilateral Bartholin's gland cyst in the past.  History Abigail [name] has a past medical history of Diverticulitis; Hypothyroid; Hypertension; Carotid artery occlusion; and Complication of anesthesia.   She has past surgical history that includes Back surgery (1980); Elbow surgery; Spine surgery; and Fracture surgery.   Her  family history is not on file.  She   reports that she quit smoking about 29 years ago. She has never used smokeless tobacco. She reports that she does not drink alcohol or use illicit drugs.  Outpatient Prescriptions Prior to Visit  Medication Sig Dispense Refill  . diltiazem (DILACOR XR) 180 MG 24 hr capsule Take 180 mg by mouth daily.    [name] Kitchen levothyroxine (SYNTHROID, LEVOTHROID) 175 MCG tablet Take 175 mcg by mouth daily.    [name] Kitchen levothyroxine (SYNTHROID, LEVOTHROID) 200 MCG tablet Take 200 mcg by mouth daily. Pt alternates between 175 mcg and the 200 mcg daily    . Multiple Vitamin (MULTIVITAMIN) tablet Take 1 tablet by mouth daily.    . ondansetron (ZOFRAN ODT) 8 MG disintegrating tablet Take 1 tablet (8 mg total) by mouth every 8 (eight) hours as needed for nausea or vomiting. 20 tablet 0  . Vitamin D, Ergocalciferol, (DRISDOL) 50000 UNITS CAPS Take 50,000 Units by mouth.    . ciprofloxacin (CIPRO) 250 MG tablet Take 1 tablet (250 mg total) by mouth 2 (two) times daily. (Patient not taking: Reported on 05/13/2015) 6 tablet 0  . co-enzyme Q-10 30 MG capsule Take 30 mg by mouth 3 (three) times daily.    . pravastatin (PRAVACHOL) 40 MG tablet Take 40 mg by mouth daily.      No facility-administered medications prior to visit.    Social History   Social History  . Marital Status: Single    Spouse Name: N/A  . Number of Children: N/A  . Years of Education: N/A   Social History Main Topics  . Smoking status: Former Smoker    Quit date: 04/05/1986  . Smokeless tobacco: Never Used  . Alcohol Use: No  . Drug Use: No  . Sexual Activity: No   Other Topics Concern  . None   Social History Narrative     Review of Systems  Constitutional: Negative for fever, chills and appetite change.  HENT: Negative for congestion, ear pain, postnasal drip, sinus pressure and sore throat.   Eyes: Negative for pain and redness.  Respiratory: Negative for cough, shortness of breath and wheezing.   Cardiovascular: Negative for leg swelling.  Gastrointestinal: Negative for nausea, vomiting, abdominal pain, diarrhea, constipation and blood in stool.  Endocrine: Negative for polyuria.  Genitourinary: Positive for vaginal pain. Negative for dysuria, urgency, frequency and flank pain.  Musculoskeletal: Negative for gait problem.  Skin: Negative for rash.  Neurological: Negative for weakness and headaches.  Psychiatric/Behavioral: Negative for confusion and decreased concentration. The patient is not nervous/anxious.     Objective:  BP 152/66 mmHg  Pulse 72  Temp(Src) 98 F (36.7 C) (Oral)  Resp 16  Ht '5\' 3"'$  (1.6 m)  Wt 161 lb (  73.029 kg)  BMI 28.53 kg/m2  SpO2 97%  Physical Exam  Constitutional: She is oriented to person, place, and time. She appears well-developed and well-nourished.  HENT:  Head: Normocephalic and atraumatic.  Eyes: Conjunctivae are normal. Pupils are equal, round, and reactive to light.  Pulmonary/Chest: Effort normal.  Genitourinary: There is lesion on the right labia.  She had a uterine prolapse that dropped her cervix out of her vagina by 3 inches the mucosa was dry. There is no cervical erosions. Is not tender. She had no bleeding.  On a repeat examination is found to have a 2-1/2 inch diameter right Bartholin's cyst.  Musculoskeletal: She exhibits no edema.  Neurological: She is alert and oriented to person, place, and time.  Skin: Skin is dry.  Psychiatric: She has a normal mood and affect. Her behavior is normal. Thought content normal.      Assessment & Plan:   Abigail [name] was seen today for pelvic pain and vaginal pain.  Diagnoses and all orders for this visit:  Uterine prolapse  Bartholin cyst  I have discontinued Ms. Bunnell's co-enzyme Q-10, pravastatin, and ciprofloxacin. I am also having her maintain her levothyroxine, levothyroxine, diltiazem, Vitamin D (Ergocalciferol), multivitamin, ondansetron, and lisinopril.  Meds ordered this encounter  Medications  . lisinopril (PRINIVIL,ZESTRIL) 10 MG tablet    Sig: Take 10 mg by mouth daily.   She was referred to [facility] hospital for further evaluation  Appropriate red flag conditions were discussed with the patient as well as actions that should be taken.  Patient expressed his understanding.  Follow-up: Return if symptoms worsen or fail to improve.  Roselee Culver, MD

## 2015-05-13 NOTE — Patient Instructions (Signed)
       Driving directions to [place]  [phone]  - more info    [place], [place] [address]     1. Head south on [address] toward [address]      0.5 mi    2. Sharp left onto [address]      0.6 mi    3. Turn left onto the [address] ramp      0.2 mi    4. Merge onto [address]      3.0 mi    5. Slight right toward [address] (signs for [address])      0.2 mi    6. Take the ramp to [address]      338 ft    7. Turn left onto [address]      0.3 mi    8. Turn left onto [address] will be on the right     0.2 mi     [facility]  [place]

## 2015-05-13 NOTE — MAU Note (Signed)
I was asked by the CNM Susa Simmonds to assess patient.  Patient has h/o Bartholin's gland abscess in the past with swelling for some time.  She has more recent onset of pain.  She also has uterine prolapse.  On exam, the cervix si out of the vagina and is dried and probably keratinized.  Attempts to reduce it were unsuccessful. On the right side, there is a typical large Bartholin's gland abscess noted which is erythematous and tender.  Procedure: After informed consent, area cleaned with alcohol.  1.5 cc of Lidocaine with Epinephrine infiltrated.  Scalpel used to make a small incision and copious amount of foul smelling blood-tinged pus drained from site.  Word catheter inserted.  Balloon filled with 4 cc NS. Patient tolerated the procedure well.  She should f/u in the GYN office for possible pessary placement.  She is not sexually active x 25 years.  She does not want surgery at this time, due to her continued daily working.

## 2015-05-13 NOTE — MAU Provider Note (Signed)
History     CSN: [id number]  Arrival date and time: 05/13/15 1149   None     No chief complaint on file.  HPI 79 y.o. [id number] with c/o "bartholin's cyst" x a few days, painful and swollen. Pt also reports what she believed to be a bladder prolapse x more than 20 years. Pt was sent from Urgent Care this morning for further eval by GYN. Pt reports h/o Bartholin's cyst/abscess about 50 years ago, states she had surgery to remove Bartholin's glands.   Past Medical History  Diagnosis Date  . Diverticulitis   . Hypothyroid   . Hypertension   . Carotid artery occlusion   . Complication of anesthesia     hard to wake up per pt    Past Surgical History  Procedure Laterality Date  . Back surgery  1980  . Elbow surgery      left  . Spine surgery    . Fracture surgery      History reviewed. No pertinent family history.  Social History  Substance Use Topics  . Smoking status: Former Smoker    Quit date: 04/05/1986  . Smokeless tobacco: Never Used  . Alcohol Use: No    Allergies:  Allergies  Allergen Reactions  . Codeine Nausea And Vomiting    No prescriptions prior to admission    Review of Systems  Constitutional: Negative.   Respiratory: Negative.   Cardiovascular: Negative.   Gastrointestinal: Negative for nausea, vomiting, abdominal pain, diarrhea and constipation.  Genitourinary: Negative for dysuria, urgency, frequency, hematuria and flank pain.       + "pink discharge", vaginal pain  Musculoskeletal: Negative.   Neurological: Negative.   Psychiatric/Behavioral: Negative.    Physical Exam   Blood pressure 167/55, pulse 54, temperature 97.3 F (36.3 C), temperature source Oral, resp. rate 18.  Physical Exam  Nursing note and vitals reviewed. Constitutional: She is oriented to person, place, and time. She appears well-developed and well-nourished. No distress.  Cardiovascular: Normal rate.   Respiratory: Effort normal.  Genitourinary:     Prolapsed  cervix  Musculoskeletal: Normal range of motion.  Neurological: She is alert and oriented to person, place, and time.  Skin: Skin is warm and dry.  Psychiatric: She has a normal mood and affect.    MAU Course  Procedures  Dr. Kennon Rounds to MAU to eval/ I&D abscess  Assessment and Plan   1. Bartholin's gland abscess   2. Prolapse of female pelvic organs   Abscess I&D today by Dr. Kennon Rounds, sitz baths at home, f/u in GYN clinic in 2 weeks.     Medication List    TAKE these medications        diltiazem 180 MG 24 hr capsule  Commonly known as:  DILACOR XR  Take 180 mg by mouth daily.     levothyroxine 175 MCG tablet  Commonly known as:  SYNTHROID, LEVOTHROID  Take 175 mcg by mouth daily.     levothyroxine 200 MCG tablet  Commonly known as:  SYNTHROID, LEVOTHROID  Take 200 mcg by mouth daily. Pt alternates between 175 mcg and the 200 mcg daily     lisinopril 10 MG tablet  Commonly known as:  PRINIVIL,ZESTRIL  Take 10 mg by mouth daily.     multivitamin tablet  Take 1 tablet by mouth daily.     ondansetron 8 MG disintegrating tablet  Commonly known as:  ZOFRAN ODT  Take 1 tablet (8 mg total) by mouth every 8 (eight) hours  as needed for nausea or vomiting.     Vitamin D (Ergocalciferol) 50000 UNITS Caps capsule  Commonly known as:  DRISDOL  Take 50,000 Units by mouth.        Follow-up Information    Follow up with [facility] In 2 weeks.   Specialty:  Obstetrics and Gynecology   Contact information:   [place] [employer] 05/13/2015, 2:04 PM

## 2015-05-14 DIAGNOSIS — Z79899 Other long term (current) drug therapy: Secondary | ICD-10-CM | POA: Diagnosis not present

## 2015-05-14 DIAGNOSIS — Z23 Encounter for immunization: Secondary | ICD-10-CM | POA: Diagnosis not present

## 2015-05-14 DIAGNOSIS — N183 Chronic kidney disease, stage 3 (moderate): Secondary | ICD-10-CM | POA: Diagnosis not present

## 2015-05-14 DIAGNOSIS — E559 Vitamin D deficiency, unspecified: Secondary | ICD-10-CM | POA: Diagnosis not present

## 2015-05-14 DIAGNOSIS — E038 Other specified hypothyroidism: Secondary | ICD-10-CM | POA: Diagnosis not present

## 2015-05-14 DIAGNOSIS — I129 Hypertensive chronic kidney disease with stage 1 through stage 4 chronic kidney disease, or unspecified chronic kidney disease: Secondary | ICD-10-CM | POA: Diagnosis not present

## 2015-05-14 DIAGNOSIS — E785 Hyperlipidemia, unspecified: Secondary | ICD-10-CM | POA: Diagnosis not present

## 2015-05-31 ENCOUNTER — Ambulatory Visit (INDEPENDENT_AMBULATORY_CARE_PROVIDER_SITE_OTHER): Payer: Medicare Other | Admitting: Obstetrics & Gynecology

## 2015-05-31 VITALS — BP 151/55 | HR 50 | Temp 97.7°F | Wt 162.5 lb

## 2015-05-31 DIAGNOSIS — N750 Cyst of Bartholin's gland: Secondary | ICD-10-CM | POA: Diagnosis not present

## 2015-05-31 DIAGNOSIS — N814 Uterovaginal prolapse, unspecified: Secondary | ICD-10-CM | POA: Diagnosis not present

## 2015-05-31 NOTE — Progress Notes (Signed)
Patient ID: Abigail [name], female   DOB: [birth year], 79 y.o.   MRN: [id number] Pt thinks she has a yeast infection

## 2015-05-31 NOTE — Progress Notes (Signed)
   CLINIC ENCOUNTER NOTE  History:  79 y.o. [id number] here today for follow up of I&D of Bartholin;s cyst with Word catheter placement on 05/13/15 and also management of Stage 3 prolapse.  She reports some vulvar irritation.   Denies any abnormal vaginal discharge, bleeding, pelvic pain or other concerns.   Past Medical History  Diagnosis Date  . Diverticulitis   . Hypothyroid   . Hypertension   . Carotid artery occlusion   . Complication of anesthesia     hard to wake up per pt    Past Surgical History  Procedure Laterality Date  . Back surgery  1980  . Elbow surgery      left  . Spine surgery    . Fracture surgery      The following portions of the patient's history were reviewed and updated as appropriate: allergies, current medications, past family history, past medical history, past social history, past surgical history and problem list.     Review of Systems:  Pertinent items noted in HPI and remainder of comprehensive ROS otherwise negative.  Objective:  Physical Exam BP 151/55 mmHg  Pulse 50  Temp(Src) 97.7 F (36.5 C)  Wt 162 lb 8 oz (73.71 kg) CONSTITUTIONAL: Well-developed, well-nourished female in no acute distress.  HENT:  Normocephalic, atraumatic. External right and left ear normal. Oropharynx is clear and moist EYES: Conjunctivae and EOM are normal. Pupils are equal, round, and reactive to light. No scleral icterus.  NECK: Normal range of motion, supple, no masses SKIN: Skin is warm and dry. No rash noted. Not diaphoretic. No erythema. No pallor. [place]: Alert and oriented to person, place, and time. Normal reflexes, muscle tone coordination. No cranial nerve deficit noted. PSYCHIATRIC: Normal mood and affect. Normal behavior. Normal judgment and thought content. CARDIOVASCULAR: Normal heart rate noted RESPIRATORY: Effort and breath sounds normal, no problems with respiration noted ABDOMEN: Soft, no distention noted.  MUSCULOSKELETAL: Normal range of  motion. No edema noted.  PELVIC: Cervic noted to be protruding about 4 cm outside introitus, atrophic external genitalia.  Word catheter noted to be extruded from Bartholin's gland I&D defect on the right, this was easily removed.  No erythema, no bleeding noted.  Prolapse was easily manually reduced.    PESSARY FITTING AND PLACEMENT During pelvic exam, patient was examined. Her vaginal introitus size, vaginal length, and prolapse stage wereused to guide selection of pessary type and size. After evaluation, a 3.5 inch ring with support pessary fitting guide was placed and patient walked around room with it in place.  No prolapse was noted in standing, or in lithotomy position even after Valsalva.  The fitter was removed and the regular  3.5 inch with ring support pessary was inserted.       Assessment & Plan:  Follow up in 2 weeks for pessary reevaluation and cleaning Will recommend vaginal estrogen at next visit No further intervention required for Bartholin's glad cyst for now; she was encouraged to continue Sitz baths Routine preventative health maintenance measures emphasized. Please refer to After Visit Summary for other counseling recommendations.    Total face-to-face time with patient: 20 minutes. Over 50% of encounter was spent on counseling and coordination of care.   Verita Schneiders, MD, [place] Attending Obstetrician & Gynecologist, [place] for [facility]

## 2015-06-01 ENCOUNTER — Encounter: Payer: Self-pay | Admitting: Obstetrics & Gynecology

## 2015-06-01 MED ORDER — RING PESSARY/SUPPORT MISC: 1.0000 | Freq: Once | Status: DC

## 2015-06-01 NOTE — Patient Instructions (Signed)
Return to clinic for any scheduled appointments or for any gynecologic concerns as needed.   

## 2015-06-21 ENCOUNTER — Ambulatory Visit: Payer: Medicare Other | Admitting: Obstetrics & Gynecology

## 2015-06-21 VITALS — BP 133/62 | HR 59 | Temp 97.9°F | Ht 62.75 in | Wt 163.3 lb

## 2015-06-21 DIAGNOSIS — N813 Complete uterovaginal prolapse: Secondary | ICD-10-CM | POA: Insufficient documentation

## 2015-06-21 NOTE — Patient Instructions (Signed)
About Pelvic Support Problems  Pelvic Support Problems Explained Ligaments, muscles, and connective tissue normally hold your bladder, uterus, and other organs in their proper places in your pelvis. When these tissues become weak, a problem with pelvic support may result. Weak support can cause one or more of the pelvic organs to drop down into the vagina. An organ may even drop so far that is partially exposed outside the body.  Pelvic support problems are named by the change in the organ. The main types of pelvic support problems are:  . Cystocele: When the bladder drops down into your vagina.  . Enterocele: When your small intestine drops between your vagina and rectum.  . Rectocele: When your rectum bulges into the vaginal wall.  [name] Kitchen Uterine prolapse: When your uterus drops into your vagina.  . Vaginal prolapse: When the top part of the vagina begins to droop. This sometimes happens after a hysterectomy (removal of the uterus).   Causes Pelvic support problems can be caused by many conditions. They may begin after you give birth, especially if you had a large baby. During childbirth, the muscles and skin of the birth canal (vagina) are stretched and sometimes torn. They heal over time but are not always exactly the same. A long pushing stage of labor may also weaken these tissues as well as very rapid births as the tissues do not have time to stretch so they tear.  Also, after menopause, there are changes in the vaginal walls resulting from a decrease in estrogen. Estrogen helps to keep the tissues toned. Low levels of estrogen weaken the vaginal walls and may cause the bladder to shift from its normal position. As women get older, the loss of muscle tone and the relaxation of muscles may cause the uterus or other organs to drop.  Over time, conditions like chronic coughing, chronic constipation, doing a lot of heavy lifting, straining to pass stool, and obesity, can also weaken the pelvic support  muscles.   Diagnosing Pelvic Support Problems Your health care provider will ask about your symptoms and do a pelvic examination. Your provider may also do a rectal exam during your pelvic exam. Your provider may ask you to: 1. Bear down and push (like you are having a bowel movement) so he or she can see if your bladder or other part of your body protrudes into the vagina. 2. Contract the muscles of your pelvis to check the strength of your pelvic muscles.  3. Do several types of urine, nerve and muscle tests of the pelvis and around the bladder to see what type of treatment is best for you.   Symptoms Symptoms of pelvic support problems depend on the organ involved, but may include:  . urine leakage  . stain or fecal loss after a bowel movement . trouble having bowel movements  . ache in the lower abdomen, groin, or lower back  . bladder infection  . a feeling of heaviness, pulling, or fullness in the pelvis, or a feeling that something is falling out of the vagina  . an organ protruding from your vaginal opening  . feeling the need to support the organs or perineal area to empty bladder or bowels . painful sexual intercourse.  Many women feel pelvic pressure or trouble holding their urine immediately after childbirth. For some, these symptoms go away permanently, in others they return as they get older.  Treatment Options A prolapsed organ cannot repair itself. Contact your health care provider as soon as  you notice symptoms of a problem. Treatment depends on what the specific problem is and how far advanced it is.  . The symptoms caused by some pelvic support problems may simply be treated with changes in diet, medicine to soften the stool, weight loss, or avoiding strenuous activities. You may also do pelvic floor exercises to help strengthen your pelvic muscles.  . Some cases of prolapse may require a special support device made from plastic or rubber called a pessary that fits into the  vagina to support the uterus, vagina, or bladder. A pessary can also help women who leak urine when coughing, straining, or exercising. In mild cases, a tampon or vaginal diaphragm may be used instead of a pessary.  Talk to your doctor or health care provider about these options. . In serious cases, surgery may be needed to put the organs back into their proper place. The uterus may be removed because of the pressure it puts on the bladder.  Your doctor will know what surgery will be best for you. How can I prevent pelvic support problems?  You can help prevent pelvic support problems by:  . maintaining a healthy lifestyle  . continuing to do pelvic floor exercises after you deliver a baby  . maintaining a healthy weight  . avoiding a lot of heavy lifting and lifting with your legs (not from your waist)  . treating constipation and avoid getting constipated by eating high fiber foods.    2007, [employer] Doc.32

## 2015-06-21 NOTE — Progress Notes (Signed)
Subjective:     Patient ID: Abigail [name], female   DOB: 1936/02/03, 79 y.o.   MRN: [id number]  HPI 79 y.o. [id number] Returns for pessary check, in place since 3 weeks ago. Some urinary incontinence after voiding, no D/C or pain  Allergies  Allergen Reactions  . Codeine Nausea And Vomiting   Current Outpatient Prescriptions on File Prior to Visit  Medication Sig Dispense Refill  . diltiazem (DILACOR XR) 180 MG 24 hr capsule Take 180 mg by mouth daily.    [name] Kitchen levothyroxine (SYNTHROID, LEVOTHROID) 175 MCG tablet Take 175 mcg by mouth daily.    [name] Kitchen levothyroxine (SYNTHROID, LEVOTHROID) 200 MCG tablet Take 200 mcg by mouth daily. Pt alternates between 175 mcg and the 200 mcg daily    . lisinopril (PRINIVIL,ZESTRIL) 10 MG tablet Take 10 mg by mouth daily.    . Multiple Vitamin (MULTIVITAMIN) tablet Take 1 tablet by mouth daily.    . ondansetron (ZOFRAN ODT) 8 MG disintegrating tablet Take 1 tablet (8 mg total) by mouth every 8 (eight) hours as needed for nausea or vomiting. 20 tablet 0  . Vitamin D, Ergocalciferol, (DRISDOL) 50000 UNITS CAPS Take 50,000 Units by mouth.     Current Facility-Administered Medications on File Prior to Visit  Medication Dose Route Frequency Provider Last Rate Last Dose  . Ring Pessary/Support MISC 1 each  1 each Does not apply Once Osborne Oman, MD       Past Medical History  Diagnosis Date  . Diverticulitis   . Hypothyroid   . Hypertension   . Carotid artery occlusion   . Complication of anesthesia     hard to wake up per pt     Review of Systems  Genitourinary: Positive for frequency. Negative for vaginal bleeding and pelvic pain.       Objective:   Physical Exam  Constitutional: She appears well-developed. No distress.  Genitourinary:  Pessary removed and cleaned and replaced, well tolerated. Vaginal mucosa without lesions  Neurological: She is alert.  Psychiatric: She has a normal mood and affect. Her behavior is normal.       Assessment:     Doing well with pessary for prolapse Urinary incontinence     Plan:     RTC 6 weeks  Woodroe Mode, MD 06/21/2015

## 2015-06-22 ENCOUNTER — Encounter: Payer: Self-pay | Admitting: Obstetrics & Gynecology

## 2015-08-02 ENCOUNTER — Ambulatory Visit (INDEPENDENT_AMBULATORY_CARE_PROVIDER_SITE_OTHER): Payer: Medicare Other | Admitting: Obstetrics & Gynecology

## 2015-08-02 VITALS — BP 155/56 | HR 50 | Temp 97.4°F | Resp 18

## 2015-08-02 DIAGNOSIS — N813 Complete uterovaginal prolapse: Secondary | ICD-10-CM

## 2015-08-02 NOTE — Patient Instructions (Signed)
About Pelvic Support Problems  Pelvic Support Problems Explained Ligaments, muscles, and connective tissue normally hold your bladder, uterus, and other organs in their proper places in your pelvis. When these tissues become weak, a problem with pelvic support may result. Weak support can cause one or more of the pelvic organs to drop down into the vagina. An organ may even drop so far that is partially exposed outside the body.  Pelvic support problems are named by the change in the organ. The main types of pelvic support problems are:  . Cystocele: When the bladder drops down into your vagina.  . Enterocele: When your small intestine drops between your vagina and rectum.  . Rectocele: When your rectum bulges into the vaginal wall.  [name] Kitchen Uterine prolapse: When your uterus drops into your vagina.  . Vaginal prolapse: When the top part of the vagina begins to droop. This sometimes happens after a hysterectomy (removal of the uterus).   Causes Pelvic support problems can be caused by many conditions. They may begin after you give birth, especially if you had a large baby. During childbirth, the muscles and skin of the birth canal (vagina) are stretched and sometimes torn. They heal over time but are not always exactly the same. A long pushing stage of labor may also weaken these tissues as well as very rapid births as the tissues do not have time to stretch so they tear.  Also, after menopause, there are changes in the vaginal walls resulting from a decrease in estrogen. Estrogen helps to keep the tissues toned. Low levels of estrogen weaken the vaginal walls and may cause the bladder to shift from its normal position. As women get older, the loss of muscle tone and the relaxation of muscles may cause the uterus or other organs to drop.  Over time, conditions like chronic coughing, chronic constipation, doing a lot of heavy lifting, straining to pass stool, and obesity, can also weaken the pelvic support  muscles.   Diagnosing Pelvic Support Problems Your health care provider will ask about your symptoms and do a pelvic examination. Your provider may also do a rectal exam during your pelvic exam. Your provider may ask you to: 1. Bear down and push (like you are having a bowel movement) so he or she can see if your bladder or other part of your body protrudes into the vagina. 2. Contract the muscles of your pelvis to check the strength of your pelvic muscles.  3. Do several types of urine, nerve and muscle tests of the pelvis and around the bladder to see what type of treatment is best for you.   Symptoms Symptoms of pelvic support problems depend on the organ involved, but may include:  . urine leakage  . stain or fecal loss after a bowel movement . trouble having bowel movements  . ache in the lower abdomen, groin, or lower back  . bladder infection  . a feeling of heaviness, pulling, or fullness in the pelvis, or a feeling that something is falling out of the vagina  . an organ protruding from your vaginal opening  . feeling the need to support the organs or perineal area to empty bladder or bowels . painful sexual intercourse.  Many women feel pelvic pressure or trouble holding their urine immediately after childbirth. For some, these symptoms go away permanently, in others they return as they get older.  Treatment Options A prolapsed organ cannot repair itself. Contact your health care provider as soon as  you notice symptoms of a problem. Treatment depends on what the specific problem is and how far advanced it is.  . The symptoms caused by some pelvic support problems may simply be treated with changes in diet, medicine to soften the stool, weight loss, or avoiding strenuous activities. You may also do pelvic floor exercises to help strengthen your pelvic muscles.  . Some cases of prolapse may require a special support device made from plastic or rubber called a pessary that fits into the  vagina to support the uterus, vagina, or bladder. A pessary can also help women who leak urine when coughing, straining, or exercising. In mild cases, a tampon or vaginal diaphragm may be used instead of a pessary.  Talk to your doctor or health care provider about these options. . In serious cases, surgery may be needed to put the organs back into their proper place. The uterus may be removed because of the pressure it puts on the bladder.  Your doctor will know what surgery will be best for you. How can I prevent pelvic support problems?  You can help prevent pelvic support problems by:  . maintaining a healthy lifestyle  . continuing to do pelvic floor exercises after you deliver a baby  . maintaining a healthy weight  . avoiding a lot of heavy lifting and lifting with your legs (not from your waist)  . treating constipation and avoid getting constipated by eating high fiber foods.    2007, [employer] Doc.32

## 2015-08-02 NOTE — Progress Notes (Signed)
Patient ID: Abigail [name], female   DOB: 11/09/1935, 79 y.o.   MRN: [id number]

## 2015-08-02 NOTE — Progress Notes (Signed)
Subjective:     Patient ID: [name], female   DOB: October 30, 1935, 79 y.o.   MRN: [id number]  HPI 79 y.o. Pelvic organ prolapse with pessary in place no complaints   Review of Systems  Gastrointestinal: Negative.   Genitourinary: Negative for vaginal bleeding, vaginal discharge, vaginal pain and pelvic pain.       Objective:   Physical Exam  Constitutional: She is oriented to person, place, and time.  Genitourinary: Vagina normal. No vaginal discharge found.  Pessary removed cleaned and replaced with no difficulty  Neurological: She is alert and oriented to person, place, and time.  Psychiatric: She has a normal mood and affect. Her behavior is normal.       Assessment:     Pelvic organ prolapse responded well to pessary     Plan:     RTC 8 week for pessary check  Woodroe Mode, MD 08/02/2015

## 2015-09-26 ENCOUNTER — Encounter: Payer: Self-pay | Admitting: Obstetrics & Gynecology

## 2015-09-26 ENCOUNTER — Ambulatory Visit (INDEPENDENT_AMBULATORY_CARE_PROVIDER_SITE_OTHER): Payer: Medicare Other | Admitting: Obstetrics & Gynecology

## 2015-09-26 VITALS — BP 187/73 | HR 68 | Temp 97.8°F | Ht 63.0 in | Wt 165.4 lb

## 2015-09-26 DIAGNOSIS — N813 Complete uterovaginal prolapse: Secondary | ICD-10-CM

## 2015-09-26 MED ORDER — OXYQUINOLONE SULFATE 0.025 % VA GEL: 1.0000 | VAGINAL | Status: DC

## 2015-09-26 NOTE — Patient Instructions (Signed)
About Pelvic Support Problems  Pelvic Support Problems Explained Ligaments, muscles, and connective tissue normally hold your bladder, uterus, and other organs in their proper places in your pelvis. When these tissues become weak, a problem with pelvic support may result. Weak support can cause one or more of the pelvic organs to drop down into the vagina. An organ may even drop so far that is partially exposed outside the body.  Pelvic support problems are named by the change in the organ. The main types of pelvic support problems are:  . Cystocele: When the bladder drops down into your vagina.  . Enterocele: When your small intestine drops between your vagina and rectum.  . Rectocele: When your rectum bulges into the vaginal wall.  [name] Kitchen Uterine prolapse: When your uterus drops into your vagina.  . Vaginal prolapse: When the top part of the vagina begins to droop. This sometimes happens after a hysterectomy (removal of the uterus).   Causes Pelvic support problems can be caused by many conditions. They may begin after you give birth, especially if you had a large baby. During childbirth, the muscles and skin of the birth canal (vagina) are stretched and sometimes torn. They heal over time but are not always exactly the same. A long pushing stage of labor may also weaken these tissues as well as very rapid births as the tissues do not have time to stretch so they tear.  Also, after menopause, there are changes in the vaginal walls resulting from a decrease in estrogen. Estrogen helps to keep the tissues toned. Low levels of estrogen weaken the vaginal walls and may cause the bladder to shift from its normal position. As women get older, the loss of muscle tone and the relaxation of muscles may cause the uterus or other organs to drop.  Over time, conditions like chronic coughing, chronic constipation, doing a lot of heavy lifting, straining to pass stool, and obesity, can also weaken the pelvic support  muscles.   Diagnosing Pelvic Support Problems Your health care provider will ask about your symptoms and do a pelvic examination. Your provider may also do a rectal exam during your pelvic exam. Your provider may ask you to: 1. Bear down and push (like you are having a bowel movement) so he or she can see if your bladder or other part of your body protrudes into the vagina. 2. Contract the muscles of your pelvis to check the strength of your pelvic muscles.  3. Do several types of urine, nerve and muscle tests of the pelvis and around the bladder to see what type of treatment is best for you.   Symptoms Symptoms of pelvic support problems depend on the organ involved, but may include:  . urine leakage  . stain or fecal loss after a bowel movement . trouble having bowel movements  . ache in the lower abdomen, groin, or lower back  . bladder infection  . a feeling of heaviness, pulling, or fullness in the pelvis, or a feeling that something is falling out of the vagina  . an organ protruding from your vaginal opening  . feeling the need to support the organs or perineal area to empty bladder or bowels . painful sexual intercourse.  Many women feel pelvic pressure or trouble holding their urine immediately after childbirth. For some, these symptoms go away permanently, in others they return as they get older.  Treatment Options A prolapsed organ cannot repair itself. Contact your health care provider as soon as  you notice symptoms of a problem. Treatment depends on what the specific problem is and how far advanced it is.  . The symptoms caused by some pelvic support problems may simply be treated with changes in diet, medicine to soften the stool, weight loss, or avoiding strenuous activities. You may also do pelvic floor exercises to help strengthen your pelvic muscles.  . Some cases of prolapse may require a special support device made from plastic or rubber called a pessary that fits into the  vagina to support the uterus, vagina, or bladder. A pessary can also help women who leak urine when coughing, straining, or exercising. In mild cases, a tampon or vaginal diaphragm may be used instead of a pessary.  Talk to your doctor or health care provider about these options. . In serious cases, surgery may be needed to put the organs back into their proper place. The uterus may be removed because of the pressure it puts on the bladder.  Your doctor will know what surgery will be best for you. How can I prevent pelvic support problems?  You can help prevent pelvic support problems by:  . maintaining a healthy lifestyle  . continuing to do pelvic floor exercises after you deliver a baby  . maintaining a healthy weight  . avoiding a lot of heavy lifting and lifting with your legs (not from your waist)  . treating constipation and avoid getting constipated by eating high fiber foods.    2007, [employer] Doc.32

## 2015-09-26 NOTE — Progress Notes (Signed)
Subjective:    Patient ID: Abigail [name], female DOB: 11-13-1935, 80 y.o. MRN: [id number]  HPI 80 y.o. [id number]  Pelvic organ prolapse with pessary in place , notes some urinary urge no incontinence   Review of Systems  Gastrointestinal: Negative.  Genitourinary: Negative for vaginal bleeding, vaginal discharge, vaginal pain and pelvic pain.       Objective:   Physical Exam  Constitutional: She is oriented to person, place, and time.  Genitourinary: Vagina normal. No vaginal discharge found.  Pessary removed cleaned and replaced with no difficulty  Neurological: She is alert and oriented to person, place, and time.  Psychiatric: She has a normal mood and affect. Her behavior is normal.       Assessment:     Pelvic organ prolapse responded well to pessary     Plan:     RTC 8 week for pessary check Refill [place] Woodroe Mode, MD .09/26/2015 .

## 2015-11-19 DIAGNOSIS — I129 Hypertensive chronic kidney disease with stage 1 through stage 4 chronic kidney disease, or unspecified chronic kidney disease: Secondary | ICD-10-CM | POA: Diagnosis not present

## 2015-11-19 DIAGNOSIS — E039 Hypothyroidism, unspecified: Secondary | ICD-10-CM | POA: Diagnosis not present

## 2015-11-19 DIAGNOSIS — N183 Chronic kidney disease, stage 3 (moderate): Secondary | ICD-10-CM | POA: Diagnosis not present

## 2015-11-19 DIAGNOSIS — E785 Hyperlipidemia, unspecified: Secondary | ICD-10-CM | POA: Diagnosis not present

## 2015-11-19 DIAGNOSIS — E559 Vitamin D deficiency, unspecified: Secondary | ICD-10-CM | POA: Diagnosis not present

## 2015-11-19 DIAGNOSIS — Z79899 Other long term (current) drug therapy: Secondary | ICD-10-CM | POA: Diagnosis not present

## 2015-11-19 DIAGNOSIS — Z0000 Encounter for general adult medical examination without abnormal findings: Secondary | ICD-10-CM | POA: Diagnosis not present

## 2015-11-21 ENCOUNTER — Ambulatory Visit (INDEPENDENT_AMBULATORY_CARE_PROVIDER_SITE_OTHER): Payer: Medicare Other | Admitting: Obstetrics & Gynecology

## 2015-11-21 ENCOUNTER — Encounter: Payer: Self-pay | Admitting: Obstetrics & Gynecology

## 2015-11-21 VITALS — BP 161/77 | HR 67 | Temp 98.5°F | Wt 160.4 lb

## 2015-11-21 DIAGNOSIS — N813 Complete uterovaginal prolapse: Secondary | ICD-10-CM

## 2015-11-21 NOTE — Patient Instructions (Signed)
About Pelvic Support Problems  Pelvic Support Problems Explained Ligaments, muscles, and connective tissue normally hold your bladder, uterus, and other organs in their proper places in your pelvis. When these tissues become weak, a problem with pelvic support may result. Weak support can cause one or more of the pelvic organs to drop down into the vagina. An organ may even drop so far that is partially exposed outside the body.  Pelvic support problems are named by the change in the organ. The main types of pelvic support problems are:  . Cystocele: When the bladder drops down into your vagina.  . Enterocele: When your small intestine drops between your vagina and rectum.  . Rectocele: When your rectum bulges into the vaginal wall.  [name] Kitchen Uterine prolapse: When your uterus drops into your vagina.  . Vaginal prolapse: When the top part of the vagina begins to droop. This sometimes happens after a hysterectomy (removal of the uterus).   Causes Pelvic support problems can be caused by many conditions. They may begin after you give birth, especially if you had a large baby. During childbirth, the muscles and skin of the birth canal (vagina) are stretched and sometimes torn. They heal over time but are not always exactly the same. A long pushing stage of labor may also weaken these tissues as well as very rapid births as the tissues do not have time to stretch so they tear.  Also, after menopause, there are changes in the vaginal walls resulting from a decrease in estrogen. Estrogen helps to keep the tissues toned. Low levels of estrogen weaken the vaginal walls and may cause the bladder to shift from its normal position. As women get older, the loss of muscle tone and the relaxation of muscles may cause the uterus or other organs to drop.  Over time, conditions like chronic coughing, chronic constipation, doing a lot of heavy lifting, straining to pass stool, and obesity, can also weaken the pelvic support  muscles.   Diagnosing Pelvic Support Problems Your health care provider will ask about your symptoms and do a pelvic examination. Your provider may also do a rectal exam during your pelvic exam. Your provider may ask you to: 1. Bear down and push (like you are having a bowel movement) so he or she can see if your bladder or other part of your body protrudes into the vagina. 2. Contract the muscles of your pelvis to check the strength of your pelvic muscles.  3. Do several types of urine, nerve and muscle tests of the pelvis and around the bladder to see what type of treatment is best for you.   Symptoms Symptoms of pelvic support problems depend on the organ involved, but may include:  . urine leakage  . stain or fecal loss after a bowel movement . trouble having bowel movements  . ache in the lower abdomen, groin, or lower back  . bladder infection  . a feeling of heaviness, pulling, or fullness in the pelvis, or a feeling that something is falling out of the vagina  . an organ protruding from your vaginal opening  . feeling the need to support the organs or perineal area to empty bladder or bowels . painful sexual intercourse.  Many women feel pelvic pressure or trouble holding their urine immediately after childbirth. For some, these symptoms go away permanently, in others they return as they get older.  Treatment Options A prolapsed organ cannot repair itself. Contact your health care provider as soon as  you notice symptoms of a problem. Treatment depends on what the specific problem is and how far advanced it is.  . The symptoms caused by some pelvic support problems may simply be treated with changes in diet, medicine to soften the stool, weight loss, or avoiding strenuous activities. You may also do pelvic floor exercises to help strengthen your pelvic muscles.  . Some cases of prolapse may require a special support device made from plastic or rubber called a pessary that fits into the  vagina to support the uterus, vagina, or bladder. A pessary can also help women who leak urine when coughing, straining, or exercising. In mild cases, a tampon or vaginal diaphragm may be used instead of a pessary.  Talk to your doctor or health care provider about these options. . In serious cases, surgery may be needed to put the organs back into their proper place. The uterus may be removed because of the pressure it puts on the bladder.  Your doctor will know what surgery will be best for you. How can I prevent pelvic support problems?  You can help prevent pelvic support problems by:  . maintaining a healthy lifestyle  . continuing to do pelvic floor exercises after you deliver a baby  . maintaining a healthy weight  . avoiding a lot of heavy lifting and lifting with your legs (not from your waist)  . treating constipation and avoid getting constipated by eating high fiber foods.    2007, [employer] Doc.32

## 2015-11-21 NOTE — Progress Notes (Signed)
Subjective:     Patient ID: Abigail [name], female   DOB: Oct 20, 1935, 80 y.o.   MRN: [id number]  HPI [id number] Returns for cleaning and reinsertion of pessary. She noted some pressure and and pink discharge today. No pain.   Review of Systems  Constitutional: Negative.   Genitourinary: Positive for vaginal bleeding and vaginal discharge.       Objective:   Physical Exam  Constitutional: She is oriented to person, place, and time. She appears well-developed. No distress.  Genitourinary: Vaginal discharge (pink stained d/c) found.  Minimal area of erythema of vaginal wall  Neurological: She is alert and oriented to person, place, and time.  Skin: Skin is warm and dry.  Psychiatric: She has a normal mood and affect. Her behavior is normal.   Pessary cleaned and replaced     Assessment:     Minimal vaginal irritation with pessary, o/w doing well      Plan:     Report if sx not improved, RTC 8 weeks for cleaning  Woodroe Mode, MD

## 2016-01-29 DIAGNOSIS — M1711 Unilateral primary osteoarthritis, right knee: Secondary | ICD-10-CM | POA: Diagnosis not present

## 2016-01-29 DIAGNOSIS — M25561 Pain in right knee: Secondary | ICD-10-CM | POA: Diagnosis not present

## 2016-02-05 DIAGNOSIS — M25561 Pain in right knee: Secondary | ICD-10-CM | POA: Diagnosis not present

## 2016-02-05 DIAGNOSIS — M1711 Unilateral primary osteoarthritis, right knee: Secondary | ICD-10-CM | POA: Diagnosis not present

## 2016-02-12 DIAGNOSIS — M25561 Pain in right knee: Secondary | ICD-10-CM | POA: Diagnosis not present

## 2016-02-12 DIAGNOSIS — M1711 Unilateral primary osteoarthritis, right knee: Secondary | ICD-10-CM | POA: Diagnosis not present

## 2016-02-14 DIAGNOSIS — M545 Low back pain: Secondary | ICD-10-CM | POA: Diagnosis not present

## 2016-02-14 DIAGNOSIS — M791 Myalgia: Secondary | ICD-10-CM | POA: Diagnosis not present

## 2016-02-19 DIAGNOSIS — M1711 Unilateral primary osteoarthritis, right knee: Secondary | ICD-10-CM | POA: Diagnosis not present

## 2016-02-19 DIAGNOSIS — M25561 Pain in right knee: Secondary | ICD-10-CM | POA: Diagnosis not present

## 2016-02-21 DIAGNOSIS — M545 Low back pain: Secondary | ICD-10-CM | POA: Diagnosis not present

## 2016-02-21 DIAGNOSIS — M791 Myalgia: Secondary | ICD-10-CM | POA: Diagnosis not present

## 2016-02-25 ENCOUNTER — Ambulatory Visit (INDEPENDENT_AMBULATORY_CARE_PROVIDER_SITE_OTHER): Payer: Medicare Other | Admitting: Obstetrics & Gynecology

## 2016-02-25 ENCOUNTER — Encounter: Payer: Self-pay | Admitting: Obstetrics & Gynecology

## 2016-02-25 VITALS — BP 140/63 | HR 57 | Ht 63.0 in | Wt 155.4 lb

## 2016-02-25 DIAGNOSIS — N813 Complete uterovaginal prolapse: Secondary | ICD-10-CM | POA: Diagnosis present

## 2016-02-25 NOTE — Progress Notes (Signed)
Expand All Collapse All   Subjective:    Patient ID: Abigail [name], female DOB: 11/11/1935, 80 y.o. MRN: [id number]  HPI [id number] Returns for cleaning and reinsertion of pessary. She noted some pressure and and pink discharge recently but resolved . No pain.   Review of Systems  Constitutional: Negative.  Genitourinary: Positive for vaginal bleeding and vaginal discharge which relsolved     Objective:   Physical Exam  Constitutional: She is oriented to person, place, and time. She appears well-developed. No distress.  Genitourinary: no discharge found.  Minimal area of erythema of vaginal wall  Neurological: She is alert and oriented to person, place, and time.  Skin: Skin is warm and dry.  Psychiatric: She has a normal mood and affect. Her behavior is normal.   Pessary cleaned and replaced     Assessment:     Pelvic prolapse managed with pessary, doing well     Plan:     Report if sx not improved, RTC 12 weeks for cleaning  Woodroe Mode, MD

## 2016-02-25 NOTE — Patient Instructions (Signed)
About Pelvic Support Problems  Pelvic Support Problems Explained Ligaments, muscles, and connective tissue normally hold your bladder, uterus, and other organs in their proper places in your pelvis. When these tissues become weak, a problem with pelvic support may result. Weak support can cause one or more of the pelvic organs to drop down into the vagina. An organ may even drop so far that is partially exposed outside the body.  Pelvic support problems are named by the change in the organ. The main types of pelvic support problems are:  . Cystocele: When the bladder drops down into your vagina.  . Enterocele: When your small intestine drops between your vagina and rectum.  . Rectocele: When your rectum bulges into the vaginal wall.  [name] Kitchen Uterine prolapse: When your uterus drops into your vagina.  . Vaginal prolapse: When the top part of the vagina begins to droop. This sometimes happens after a hysterectomy (removal of the uterus).   Causes Pelvic support problems can be caused by many conditions. They may begin after you give birth, especially if you had a large baby. During childbirth, the muscles and skin of the birth canal (vagina) are stretched and sometimes torn. They heal over time but are not always exactly the same. A long pushing stage of labor may also weaken these tissues as well as very rapid births as the tissues do not have time to stretch so they tear.  Also, after menopause, there are changes in the vaginal walls resulting from a decrease in estrogen. Estrogen helps to keep the tissues toned. Low levels of estrogen weaken the vaginal walls and may cause the bladder to shift from its normal position. As women get older, the loss of muscle tone and the relaxation of muscles may cause the uterus or other organs to drop.  Over time, conditions like chronic coughing, chronic constipation, doing a lot of heavy lifting, straining to pass stool, and obesity, can also weaken the pelvic support  muscles.   Diagnosing Pelvic Support Problems Your health care provider will ask about your symptoms and do a pelvic examination. Your provider may also do a rectal exam during your pelvic exam. Your provider may ask you to: 1. Bear down and push (like you are having a bowel movement) so he or she can see if your bladder or other part of your body protrudes into the vagina. 2. Contract the muscles of your pelvis to check the strength of your pelvic muscles.  3. Do several types of urine, nerve and muscle tests of the pelvis and around the bladder to see what type of treatment is best for you.   Symptoms Symptoms of pelvic support problems depend on the organ involved, but may include:  . urine leakage  . stain or fecal loss after a bowel movement . trouble having bowel movements  . ache in the lower abdomen, groin, or lower back  . bladder infection  . a feeling of heaviness, pulling, or fullness in the pelvis, or a feeling that something is falling out of the vagina  . an organ protruding from your vaginal opening  . feeling the need to support the organs or perineal area to empty bladder or bowels . painful sexual intercourse.  Many women feel pelvic pressure or trouble holding their urine immediately after childbirth. For some, these symptoms go away permanently, in others they return as they get older.  Treatment Options A prolapsed organ cannot repair itself. Contact your health care provider as soon as  you notice symptoms of a problem. Treatment depends on what the specific problem is and how far advanced it is.  . The symptoms caused by some pelvic support problems may simply be treated with changes in diet, medicine to soften the stool, weight loss, or avoiding strenuous activities. You may also do pelvic floor exercises to help strengthen your pelvic muscles.  . Some cases of prolapse may require a special support device made from plastic or rubber called a pessary that fits into the  vagina to support the uterus, vagina, or bladder. A pessary can also help women who leak urine when coughing, straining, or exercising. In mild cases, a tampon or vaginal diaphragm may be used instead of a pessary.  Talk to your doctor or health care provider about these options. . In serious cases, surgery may be needed to put the organs back into their proper place. The uterus may be removed because of the pressure it puts on the bladder.  Your doctor will know what surgery will be best for you. How can I prevent pelvic support problems?  You can help prevent pelvic support problems by:  . maintaining a healthy lifestyle  . continuing to do pelvic floor exercises after you deliver a baby  . maintaining a healthy weight  . avoiding a lot of heavy lifting and lifting with your legs (not from your waist)  . treating constipation and avoid getting constipated by eating high fiber foods.    2007, [employer] Doc.32

## 2016-02-27 DIAGNOSIS — M25561 Pain in right knee: Secondary | ICD-10-CM | POA: Diagnosis not present

## 2016-02-27 DIAGNOSIS — M1711 Unilateral primary osteoarthritis, right knee: Secondary | ICD-10-CM | POA: Diagnosis not present

## 2016-02-28 DIAGNOSIS — M461 Sacroiliitis, not elsewhere classified: Secondary | ICD-10-CM | POA: Diagnosis not present

## 2016-02-28 DIAGNOSIS — M5127 Other intervertebral disc displacement, lumbosacral region: Secondary | ICD-10-CM | POA: Diagnosis not present

## 2016-02-28 DIAGNOSIS — M791 Myalgia: Secondary | ICD-10-CM | POA: Diagnosis not present

## 2016-02-28 DIAGNOSIS — M5137 Other intervertebral disc degeneration, lumbosacral region: Secondary | ICD-10-CM | POA: Diagnosis not present

## 2016-02-28 DIAGNOSIS — M545 Low back pain: Secondary | ICD-10-CM | POA: Diagnosis not present

## 2016-03-04 DIAGNOSIS — M25561 Pain in right knee: Secondary | ICD-10-CM | POA: Diagnosis not present

## 2016-03-04 DIAGNOSIS — M1711 Unilateral primary osteoarthritis, right knee: Secondary | ICD-10-CM | POA: Diagnosis not present

## 2016-03-06 DIAGNOSIS — M545 Low back pain: Secondary | ICD-10-CM | POA: Diagnosis not present

## 2016-03-06 DIAGNOSIS — M791 Myalgia: Secondary | ICD-10-CM | POA: Diagnosis not present

## 2016-03-11 DIAGNOSIS — M1711 Unilateral primary osteoarthritis, right knee: Secondary | ICD-10-CM | POA: Diagnosis not present

## 2016-03-11 DIAGNOSIS — M25561 Pain in right knee: Secondary | ICD-10-CM | POA: Diagnosis not present

## 2016-03-13 DIAGNOSIS — M791 Myalgia: Secondary | ICD-10-CM | POA: Diagnosis not present

## 2016-03-13 DIAGNOSIS — M461 Sacroiliitis, not elsewhere classified: Secondary | ICD-10-CM | POA: Diagnosis not present

## 2016-03-25 DIAGNOSIS — M791 Myalgia: Secondary | ICD-10-CM | POA: Diagnosis not present

## 2016-03-25 DIAGNOSIS — M461 Sacroiliitis, not elsewhere classified: Secondary | ICD-10-CM | POA: Diagnosis not present

## 2016-03-27 DIAGNOSIS — M791 Myalgia: Secondary | ICD-10-CM | POA: Diagnosis not present

## 2016-03-27 DIAGNOSIS — M461 Sacroiliitis, not elsewhere classified: Secondary | ICD-10-CM | POA: Diagnosis not present

## 2016-04-01 DIAGNOSIS — M461 Sacroiliitis, not elsewhere classified: Secondary | ICD-10-CM | POA: Diagnosis not present

## 2016-04-01 DIAGNOSIS — M791 Myalgia: Secondary | ICD-10-CM | POA: Diagnosis not present

## 2016-04-03 DIAGNOSIS — M791 Myalgia: Secondary | ICD-10-CM | POA: Diagnosis not present

## 2016-04-03 DIAGNOSIS — M461 Sacroiliitis, not elsewhere classified: Secondary | ICD-10-CM | POA: Diagnosis not present

## 2016-04-04 DIAGNOSIS — Z23 Encounter for immunization: Secondary | ICD-10-CM | POA: Diagnosis not present

## 2016-04-10 DIAGNOSIS — M461 Sacroiliitis, not elsewhere classified: Secondary | ICD-10-CM | POA: Diagnosis not present

## 2016-04-10 DIAGNOSIS — M791 Myalgia: Secondary | ICD-10-CM | POA: Diagnosis not present

## 2016-04-30 DIAGNOSIS — M810 Age-related osteoporosis without current pathological fracture: Secondary | ICD-10-CM | POA: Diagnosis not present

## 2016-05-30 ENCOUNTER — Ambulatory Visit (INDEPENDENT_AMBULATORY_CARE_PROVIDER_SITE_OTHER): Payer: Medicare Other | Admitting: Obstetrics & Gynecology

## 2016-05-30 ENCOUNTER — Encounter: Payer: Self-pay | Admitting: Obstetrics & Gynecology

## 2016-05-30 VITALS — BP 159/72 | HR 64 | Wt 151.6 lb

## 2016-05-30 DIAGNOSIS — N813 Complete uterovaginal prolapse: Secondary | ICD-10-CM

## 2016-05-30 MED ORDER — ESTRADIOL 0.1 MG/GM VA CREA: TOPICAL_CREAM | VAGINAL | 2 refills | Status: DC

## 2016-05-30 NOTE — Patient Instructions (Signed)
Estradiol vaginal cream What is this medicine? ESTRADIOL (es tra DYE ole) contains the female hormone estrogen. It is used for symptoms of menopause, like vaginal dryness and irritation. This medicine may be used for other purposes; ask your health care provider or pharmacist if you have questions. What should I tell my health care provider before I take this medicine? They need to know if you have any of these conditions: -abnormal vaginal bleeding -blood vessel disease or blood clots -breast, cervical, endometrial, ovarian, liver, or uterine cancer -dementia -diabetes -gallbladder disease -heart disease or recent heart attack -high blood pressure -high cholesterol -high levels of calcium in the blood -hysterectomy -kidney disease -liver disease -migraine headaches -protein C deficiency -protein S deficiency -stroke -systemic lupus erythematosus (SLE) -tobacco smoker -an unusual or allergic reaction to estrogens, other hormones, soy, other medicines, foods, dyes, or preservatives -pregnant or trying to get pregnant -breast-feeding How should I use this medicine? This medicine is for use in the vagina only. Do not take by mouth. Follow the directions on the prescription label. Read package directions carefully before using. Use the special applicator supplied with the cream. Wash hands before and after use. Fill the applicator with the prescribed amount of cream. Lie on your back, part and bend your knees. Insert the applicator into the vagina and push the plunger to expel the cream into the vagina. Wash the applicator with warm soapy water and rinse well. Use exactly as directed for the complete length of time prescribed. Do not stop using except on the advice of your doctor or health care professional. A patient package insert for the product will be given with each prescription and refill. Read this sheet carefully each time. The sheet may change frequently. Talk to your  pediatrician regarding the use of this medicine in children. This medicine is not approved for use in children. Overdosage: If you think you have taken too much of this medicine contact a poison control center or emergency room at once. NOTE: This medicine is only for you. Do not share this medicine with others. What if I miss a dose? If you miss a dose, use it as soon as you can. If it is almost time for your next dose, use only that dose. Do not use double or extra doses. What may interact with this medicine? Do not take this medicine with any of the following medications: -aromatase inhibitors like aminoglutethimide, anastrozole, exemestane, letrozole, testolactone This medicine may also interact with the following medications: -barbiturates used for inducing sleep or treating seizures -carbamazepine -grapefruit juice -medicines for fungal infections like ketoconazole and itraconazole -raloxifene -rifabutin -rifampin -rifapentine -ritonavir -some antibiotics used to treat infections -St. John's Wort -tamoxifen -warfarin This list may not describe all possible interactions. Give your health care provider a list of all the medicines, herbs, non-prescription drugs, or dietary supplements you use. Also tell them if you smoke, drink alcohol, or use illegal drugs. Some items may interact with your medicine. What should I watch for while using this medicine? Visit your health care professional for regular checks on your progress. You will need a regular breast and pelvic exam. You should also discuss the need for regular mammograms with your health care professional, and follow his or her guidelines. This medicine can make your body retain fluid, making your fingers, hands, or ankles swell. Your blood pressure can go up. Contact your doctor or health care professional if you feel you are retaining fluid. If you have any reason to think   you are pregnant, stop taking this medicine at once and  contact your doctor or health care professional. Tobacco smoking increases the risk of getting a blood clot or having a stroke, especially if you are more than 78 years old. You are strongly advised not to smoke. If you wear contact lenses and notice visual changes, or if the lenses begin to feel uncomfortable, consult your eye care specialist. If you are going to have elective surgery, you may need to stop taking this medicine beforehand. Consult your health care professional for advice prior to scheduling the surgery. What side effects may I notice from receiving this medicine? Side effects that you should report to your doctor or health care professional as soon as possible: -allergic reactions like skin rash, itching or hives, swelling of the face, lips, or tongue -breast tissue changes or discharge -changes in vision -chest pain -confusion, trouble speaking or understanding -dark urine -general ill feeling or flu-like symptoms -light-colored stools -nausea, vomiting -pain, swelling, warmth in the leg -right upper belly pain -severe headaches -shortness of breath -sudden numbness or weakness of the face, arm or leg -trouble walking, dizziness, loss of balance or coordination -unusual vaginal bleeding -yellowing of the eyes or skin Side effects that usually do not require medical attention (report to your doctor or health care professional if they continue or are bothersome): -hair loss -increased hunger or thirst -increased urination -symptoms of vaginal infection like itching, irritation or unusual discharge -unusually weak or tired This list may not describe all possible side effects. Call your doctor for medical advice about side effects. You may report side effects to FDA at 1-800-FDA-1088. Where should I keep my medicine? Keep out of the reach of children. Store at room temperature between 15 and 30 degrees C (59 and 86 degrees F). Protect from temperatures above 40 degrees C  (104 degrees C). Do not freeze. Throw away any unused medicine after the expiration date. NOTE: This sheet is a summary. It may not cover all possible information. If you have questions about this medicine, talk to your doctor, pharmacist, or health care provider.    2016, [employer]. (2010-11-13 09:18:12)  

## 2016-05-30 NOTE — Progress Notes (Signed)
Patient had vaginal bleeding approximately 3 months ago, and again a week ago.

## 2016-05-30 NOTE — Progress Notes (Signed)
Subjective:cc; noted episodes of blood in vaginal discharge: needs pessary check     Patient ID: Abigail [name], female   DOB: [birth year], 80 y.o.   MRN: [id number]  [id number] No LMP recorded. Patient is postmenopausal. Vaginal vault prolapse with pessary in place. She has had 2 episodes of vaginal bleeding since last pessary check, none today   Review of Systems  Constitutional: Negative.   Gastrointestinal: Positive for constipation.  Genitourinary: Positive for vaginal bleeding and vaginal discharge. Negative for difficulty urinating, dysuria, pelvic pain and vaginal pain.       Objective:   Physical Exam  Constitutional: She appears well-developed. No distress.  Cardiovascular: Normal rate.   Genitourinary: Vaginal discharge (blood stained) found.  No ulceration noted Pessary cleaned and replaced without difficulty    Assessment:     Patient Active Problem List   Diagnosis Date Noted  . Complete uterine prolapse 06/21/2015  . CAP (community acquired pneumonia) 04/12/2012  . Bacteremia 04/08/2012  . UTI (lower urinary tract infection) 04/05/2012  . Nausea and vomiting 04/05/2012  . LLQ abdominal pain 04/05/2012  . Acute renal failure ([place]) 04/05/2012  . Hyponatremia 04/05/2012   Suspect vaginal atrophy     Plan:     Estrace cream vaginal for 8 weeks total and RTC 2 months Report if her sx do not improve  Woodroe Mode, MD 05/30/2016

## 2016-06-04 DIAGNOSIS — N182 Chronic kidney disease, stage 2 (mild): Secondary | ICD-10-CM | POA: Diagnosis not present

## 2016-06-04 DIAGNOSIS — E785 Hyperlipidemia, unspecified: Secondary | ICD-10-CM | POA: Diagnosis not present

## 2016-06-04 DIAGNOSIS — I129 Hypertensive chronic kidney disease with stage 1 through stage 4 chronic kidney disease, or unspecified chronic kidney disease: Secondary | ICD-10-CM | POA: Diagnosis not present

## 2016-06-04 DIAGNOSIS — R7309 Other abnormal glucose: Secondary | ICD-10-CM | POA: Diagnosis not present

## 2016-06-04 DIAGNOSIS — E039 Hypothyroidism, unspecified: Secondary | ICD-10-CM | POA: Diagnosis not present

## 2016-07-11 ENCOUNTER — Other Ambulatory Visit: Payer: Self-pay | Admitting: Obstetrics & Gynecology

## 2016-07-11 DIAGNOSIS — N813 Complete uterovaginal prolapse: Secondary | ICD-10-CM

## 2016-07-25 ENCOUNTER — Other Ambulatory Visit: Payer: Self-pay | Admitting: Obstetrics & Gynecology

## 2016-07-25 DIAGNOSIS — N813 Complete uterovaginal prolapse: Secondary | ICD-10-CM

## 2016-07-28 ENCOUNTER — Telehealth: Payer: Self-pay | Admitting: General Practice

## 2016-07-28 DIAGNOSIS — N813 Complete uterovaginal prolapse: Secondary | ICD-10-CM

## 2016-07-28 MED ORDER — OXYQUINOLONE SULFATE 0.025 % VA GEL: 1.0000 | VAGINAL | 2 refills | Status: DC

## 2016-07-28 NOTE — Telephone Encounter (Signed)
Patient called and left message stating her pharmacy sent [place] a request a week ago. Patient states she needs  A refill on her trimo san. Per chart review, refill was sent but transmission failed. Will reorder. Called patient, no answer- left message stating we are trying to reach you to return your phone call. We have received your message and have sent that prescription to your pharmacy. If you have other questions or concerns, you may call us back

## 2016-08-14 ENCOUNTER — Ambulatory Visit: Payer: Medicare Other | Admitting: Obstetrics & Gynecology

## 2016-08-26 ENCOUNTER — Encounter: Payer: Self-pay | Admitting: Obstetrics & Gynecology

## 2016-08-26 ENCOUNTER — Ambulatory Visit (INDEPENDENT_AMBULATORY_CARE_PROVIDER_SITE_OTHER): Payer: Medicare Other | Admitting: Obstetrics & Gynecology

## 2016-08-26 VITALS — BP 133/53 | HR 53

## 2016-08-26 DIAGNOSIS — N813 Complete uterovaginal prolapse: Secondary | ICD-10-CM | POA: Diagnosis present

## 2016-08-26 DIAGNOSIS — N3281 Overactive bladder: Secondary | ICD-10-CM | POA: Insufficient documentation

## 2016-08-26 MED ORDER — OXYBUTYNIN CHLORIDE ER 10 MG PO TB24: 10.0000 mg | ORAL_TABLET | Freq: Every day | ORAL | 3 refills | Status: DC

## 2016-08-27 NOTE — Progress Notes (Signed)
Patient ID: Abigail [name], female   DOB: [birth year], 81 y.o.   MRN: [id number]  Chief Complaint  Patient presents with  . Follow-up  pessary check  HPI Abigail [name] is a 81 y.o. female.  [id number] No LMP recorded. Patient is postmenopausal. Doing well with her pessary, did not continue to use estrace due to vaginal irritation. No spotting noted or pain. HPI  Past Medical History:  Diagnosis Date  . Carotid artery occlusion   . Complication of anesthesia    hard to wake up per pt  . Diverticulitis   . Hypertension   . Hypothyroid     Past Surgical History:  Procedure Laterality Date  . BACK SURGERY  1980  . ELBOW SURGERY     left  . FRACTURE SURGERY    . SPINE SURGERY      History reviewed. No pertinent family history.  Social History Social History  Substance Use Topics  . Smoking status: Former Smoker    Quit date: 04/05/1986  . Smokeless tobacco: Never Used  . Alcohol use No    Allergies  Allergen Reactions  . Codeine Nausea And Vomiting    Current Outpatient Prescriptions  Medication Sig Dispense Refill  . ALPRAZolam (XANAX) 0.25 MG tablet Reported on 11/21/2015  2  . Cholecalciferol (VITAMIN D-3 PO) Take 1 drop by mouth daily. Uses liquid via  dropper daily    . diltiazem (DILACOR XR) 180 MG 24 hr capsule Take 180 mg by mouth daily.    [name] Kitchen levothyroxine (SYNTHROID, LEVOTHROID) 200 MCG tablet Take 200 mcg by mouth daily. Pt alternates between 175 mcg and the 200 mcg daily    . lisinopril (PRINIVIL,ZESTRIL) 10 MG tablet Take 10 mg by mouth daily. Reported on 02/25/2016    . Multiple Vitamin (MULTIVITAMIN) tablet Take 1 tablet by mouth daily.    [name] Kitchen OVER THE COUNTER MEDICATION     . OXYQUINOLONE SULFATE VAGINAL (TRIMO-SAN) 0.025 % GEL Place 1 Applicatorful vaginally 3 (three) times a week. 113.4 g 2  . Probiotic Product (PROBIOTIC DAILY PO) Take by mouth.    . senna (SENOKOT) 8.6 MG tablet Take 1 tablet by mouth daily.    [name] Kitchen estradiol (ESTRACE VAGINAL) 0.1 MG/GM vaginal  cream Apply vaginally daily for 14 days then 2-3 times a week for total of 8 weeks (Patient not taking: Reported on 08/26/2016) 42.5 g 2  . lisinopril-hydrochlorothiazide (PRINZIDE,ZESTORETIC) 20-25 MG tablet Take 1 tablet by mouth daily.  5  . oxybutynin (DITROPAN XL) 10 MG 24 hr tablet Take 1 tablet (10 mg total) by mouth at bedtime. 30 tablet 3  . pravastatin (PRAVACHOL) 40 MG tablet Take 40 mg by mouth at bedtime. Reported on 02/25/2016  5   Current Facility-Administered Medications  Medication Dose Route Frequency Provider Last Rate Last Dose  . Ring Pessary/Support MISC 1 each  1 each Does not apply Once Osborne Oman, MD        Review of Systems Review of Systems  Genitourinary: Positive for frequency and urgency (occasional incontinence, wears a pad).    Blood pressure (!) 133/53, pulse (!) 53.  Physical Exam Physical Exam  Constitutional: She appears well-developed. No distress.  Pulmonary/Chest: Effort normal.  Genitourinary: Vagina normal. No vaginal discharge found.  Genitourinary Comments: Pessary removed, cleaned and replaced  Psychiatric: She has a normal mood and affect. Her behavior is normal.    Data Reviewed   Assessment    Uterine prolapse Sx of urge incontinence    Plan  Ditropan XL daily RTC 3 months       Emeterio Reeve 08/27/2016, 6:37 AM

## 2016-11-28 ENCOUNTER — Ambulatory Visit (INDEPENDENT_AMBULATORY_CARE_PROVIDER_SITE_OTHER): Payer: Medicare Other | Admitting: Obstetrics & Gynecology

## 2016-11-28 ENCOUNTER — Encounter: Payer: Self-pay | Admitting: Obstetrics & Gynecology

## 2016-11-28 DIAGNOSIS — N3281 Overactive bladder: Secondary | ICD-10-CM | POA: Diagnosis present

## 2016-11-28 MED ORDER — OXYBUTYNIN CHLORIDE ER 10 MG PO TB24: 10.0000 mg | ORAL_TABLET | Freq: Every day | ORAL | 3 refills | Status: DC

## 2016-11-28 NOTE — Progress Notes (Signed)
$'[]'u$ Hover for attribution information Patient ID: Abigail [name], female   DOB: [birth year], 81 y.o.   MRN: [id number]     Chief Complaint  Patient presents with  . Follow-up  pessary check  HPI Abigail [name] is a 81 y.o. female.  [id number] No LMP recorded. Patient is postmenopausal. Doing well with her pessary. No spotting noted or pain. HPI      Past Medical History:  Diagnosis Date  . Carotid artery occlusion   . Complication of anesthesia    hard to wake up per pt  . Diverticulitis   . Hypertension   . Hypothyroid          Past Surgical History:  Procedure Laterality Date  . BACK SURGERY  1980  . ELBOW SURGERY     left  . FRACTURE SURGERY    . SPINE SURGERY      History reviewed. No pertinent family history.  Social History      Social History  Substance Use Topics  . Smoking status: Former Smoker    Quit date: 04/05/1986  . Smokeless tobacco: Never Used  . Alcohol use No        Allergies  Allergen Reactions  . Codeine Nausea And Vomiting          Current Outpatient Prescriptions  Medication Sig Dispense Refill  . ALPRAZolam (XANAX) 0.25 MG tablet Reported on 11/21/2015  2  . Cholecalciferol (VITAMIN D-3 PO) Take 1 drop by mouth daily. Uses liquid via  dropper daily    . diltiazem (DILACOR XR) 180 MG 24 hr capsule Take 180 mg by mouth daily.    [name] Kitchen levothyroxine (SYNTHROID, LEVOTHROID) 200 MCG tablet Take 200 mcg by mouth daily. Pt alternates between 175 mcg and the 200 mcg daily    . lisinopril (PRINIVIL,ZESTRIL) 10 MG tablet Take 10 mg by mouth daily. Reported on 02/25/2016    . Multiple Vitamin (MULTIVITAMIN) tablet Take 1 tablet by mouth daily.    [name] Kitchen OVER THE COUNTER MEDICATION     . OXYQUINOLONE SULFATE VAGINAL (TRIMO-SAN) 0.025 % GEL Place 1 Applicatorful vaginally 3 (three) times a week. 113.4 g 2  . Probiotic Product (PROBIOTIC DAILY PO) Take by mouth.    . senna (SENOKOT) 8.6 MG tablet Take 1 tablet by mouth  daily.    [name] Kitchen estradiol (ESTRACE VAGINAL) 0.1 MG/GM vaginal cream Apply vaginally daily for 14 days then 2-3 times a week for total of 8 weeks (Patient not taking: Reported on 08/26/2016) 42.5 g 2  . lisinopril-hydrochlorothiazide (PRINZIDE,ZESTORETIC) 20-25 MG tablet Take 1 tablet by mouth daily.  5  . oxybutynin (DITROPAN XL) 10 MG 24 hr tablet Take 1 tablet (10 mg total) by mouth at bedtime. 30 tablet 3  . pravastatin (PRAVACHOL) 40 MG tablet Take 40 mg by mouth at bedtime. Reported on 02/25/2016  5            Current Facility-Administered Medications  Medication Dose Route Frequency Provider Last Rate Last Dose  . Ring Pessary/Support MISC 1 each  1 each Does not apply Once Osborne Oman, MD        Review of Systems Review of Systems  Genitourinary: Positive for frequency and urgency which is improved with Ditropan Dry mouth.    Blood pressure (!) 154/73, pulse 62, weight 153 lb 11.2 oz (69.7 kg).   Physical Exam Physical Exam  Constitutional: She appears well-developed. No distress.  Pulmonary/Chest: Effort normal.  Genitourinary: Vagina normal. No vaginal discharge found.  Genitourinary Comments:  Pessary removed, cleaned and replaced  Psychiatric: She has a normal mood and affect. Her behavior is normal.    Data Reviewed   Assessment    Uterine prolapse Sx of urge incontinence improved on medication  Plan    Ditropan XL daily continue RTC 3 months  Woodroe Mode, MD 11/28/2016

## 2016-11-28 NOTE — Patient Instructions (Signed)
About Pelvic Support Problems  Pelvic Support Problems Explained Ligaments, muscles, and connective tissue normally hold your bladder, uterus, and other organs in their proper places in your pelvis. When these tissues become weak, a problem with pelvic support may result. Weak support can cause one or more of the pelvic organs to drop down into the vagina. An organ may even drop so far that is partially exposed outside the body.  Pelvic support problems are named by the change in the organ. The main types of pelvic support problems are:  . Cystocele: When the bladder drops down into your vagina.  . Enterocele: When your small intestine drops between your vagina and rectum.  . Rectocele: When your rectum bulges into the vaginal wall.  [name] Kitchen Uterine prolapse: When your uterus drops into your vagina.  . Vaginal prolapse: When the top part of the vagina begins to droop. This sometimes happens after a hysterectomy (removal of the uterus).   Causes Pelvic support problems can be caused by many conditions. They may begin after you give birth, especially if you had a large baby. During childbirth, the muscles and skin of the birth canal (vagina) are stretched and sometimes torn. They heal over time but are not always exactly the same. A long pushing stage of labor may also weaken these tissues as well as very rapid births as the tissues do not have time to stretch so they tear.  Also, after menopause, there are changes in the vaginal walls resulting from a decrease in estrogen. Estrogen helps to keep the tissues toned. Low levels of estrogen weaken the vaginal walls and may cause the bladder to shift from its normal position. As women get older, the loss of muscle tone and the relaxation of muscles may cause the uterus or other organs to drop.  Over time, conditions like chronic coughing, chronic constipation, doing a lot of heavy lifting, straining to pass stool, and obesity, can also weaken the pelvic support  muscles.   Diagnosing Pelvic Support Problems Your health care provider will ask about your symptoms and do a pelvic examination. Your provider may also do a rectal exam during your pelvic exam. Your provider may ask you to: 1. Bear down and push (like you are having a bowel movement) so he or she can see if your bladder or other part of your body protrudes into the vagina. 2. Contract the muscles of your pelvis to check the strength of your pelvic muscles.  3. Do several types of urine, nerve and muscle tests of the pelvis and around the bladder to see what type of treatment is best for you.   Symptoms Symptoms of pelvic support problems depend on the organ involved, but may include:  . urine leakage  . stain or fecal loss after a bowel movement . trouble having bowel movements  . ache in the lower abdomen, groin, or lower back  . bladder infection  . a feeling of heaviness, pulling, or fullness in the pelvis, or a feeling that something is falling out of the vagina  . an organ protruding from your vaginal opening  . feeling the need to support the organs or perineal area to empty bladder or bowels . painful sexual intercourse.  Many women feel pelvic pressure or trouble holding their urine immediately after childbirth. For some, these symptoms go away permanently, in others they return as they get older.  Treatment Options A prolapsed organ cannot repair itself. Contact your health care provider as soon as  you notice symptoms of a problem. Treatment depends on what the specific problem is and how far advanced it is.  . The symptoms caused by some pelvic support problems may simply be treated with changes in diet, medicine to soften the stool, weight loss, or avoiding strenuous activities. You may also do pelvic floor exercises to help strengthen your pelvic muscles.  . Some cases of prolapse may require a special support device made from plastic or rubber called a pessary that fits into the  vagina to support the uterus, vagina, or bladder. A pessary can also help women who leak urine when coughing, straining, or exercising. In mild cases, a tampon or vaginal diaphragm may be used instead of a pessary.  Talk to your doctor or health care provider about these options. . In serious cases, surgery may be needed to put the organs back into their proper place. The uterus may be removed because of the pressure it puts on the bladder.  Your doctor will know what surgery will be best for you. How can I prevent pelvic support problems?  You can help prevent pelvic support problems by:  . maintaining a healthy lifestyle  . continuing to do pelvic floor exercises after you deliver a baby  . maintaining a healthy weight  . avoiding a lot of heavy lifting and lifting with your legs (not from your waist)  . treating constipation and avoid getting constipated by eating high fiber foods.    2007, [employer] Doc.32

## 2016-12-10 DIAGNOSIS — N390 Urinary tract infection, site not specified: Secondary | ICD-10-CM | POA: Diagnosis not present

## 2016-12-10 DIAGNOSIS — I129 Hypertensive chronic kidney disease with stage 1 through stage 4 chronic kidney disease, or unspecified chronic kidney disease: Secondary | ICD-10-CM | POA: Diagnosis not present

## 2016-12-10 DIAGNOSIS — E785 Hyperlipidemia, unspecified: Secondary | ICD-10-CM | POA: Diagnosis not present

## 2016-12-10 DIAGNOSIS — N183 Chronic kidney disease, stage 3 (moderate): Secondary | ICD-10-CM | POA: Diagnosis not present

## 2016-12-10 DIAGNOSIS — E559 Vitamin D deficiency, unspecified: Secondary | ICD-10-CM | POA: Diagnosis not present

## 2016-12-10 DIAGNOSIS — E039 Hypothyroidism, unspecified: Secondary | ICD-10-CM | POA: Diagnosis not present

## 2016-12-10 DIAGNOSIS — Z0000 Encounter for general adult medical examination without abnormal findings: Secondary | ICD-10-CM | POA: Diagnosis not present

## 2017-02-17 ENCOUNTER — Other Ambulatory Visit: Payer: Self-pay | Admitting: Obstetrics & Gynecology

## 2017-02-17 DIAGNOSIS — N813 Complete uterovaginal prolapse: Secondary | ICD-10-CM

## 2017-03-13 ENCOUNTER — Other Ambulatory Visit: Payer: Self-pay

## 2017-05-17 DIAGNOSIS — Z23 Encounter for immunization: Secondary | ICD-10-CM | POA: Diagnosis not present

## 2017-06-17 DIAGNOSIS — E039 Hypothyroidism, unspecified: Secondary | ICD-10-CM | POA: Diagnosis not present

## 2017-06-17 DIAGNOSIS — N183 Chronic kidney disease, stage 3 (moderate): Secondary | ICD-10-CM | POA: Diagnosis not present

## 2017-06-17 DIAGNOSIS — R7309 Other abnormal glucose: Secondary | ICD-10-CM | POA: Diagnosis not present

## 2017-06-17 DIAGNOSIS — E785 Hyperlipidemia, unspecified: Secondary | ICD-10-CM | POA: Diagnosis not present

## 2017-06-17 DIAGNOSIS — I129 Hypertensive chronic kidney disease with stage 1 through stage 4 chronic kidney disease, or unspecified chronic kidney disease: Secondary | ICD-10-CM | POA: Diagnosis not present

## 2017-06-25 DIAGNOSIS — M9903 Segmental and somatic dysfunction of lumbar region: Secondary | ICD-10-CM | POA: Diagnosis not present

## 2017-06-25 DIAGNOSIS — M9901 Segmental and somatic dysfunction of cervical region: Secondary | ICD-10-CM | POA: Diagnosis not present

## 2017-06-25 DIAGNOSIS — M5412 Radiculopathy, cervical region: Secondary | ICD-10-CM | POA: Diagnosis not present

## 2017-06-25 DIAGNOSIS — M50323 Other cervical disc degeneration at C6-C7 level: Secondary | ICD-10-CM | POA: Diagnosis not present

## 2017-07-01 DIAGNOSIS — M9903 Segmental and somatic dysfunction of lumbar region: Secondary | ICD-10-CM | POA: Diagnosis not present

## 2017-07-01 DIAGNOSIS — M9901 Segmental and somatic dysfunction of cervical region: Secondary | ICD-10-CM | POA: Diagnosis not present

## 2017-07-01 DIAGNOSIS — M5412 Radiculopathy, cervical region: Secondary | ICD-10-CM | POA: Diagnosis not present

## 2017-07-01 DIAGNOSIS — M50323 Other cervical disc degeneration at C6-C7 level: Secondary | ICD-10-CM | POA: Diagnosis not present

## 2017-07-08 DIAGNOSIS — M50323 Other cervical disc degeneration at C6-C7 level: Secondary | ICD-10-CM | POA: Diagnosis not present

## 2017-07-08 DIAGNOSIS — M9901 Segmental and somatic dysfunction of cervical region: Secondary | ICD-10-CM | POA: Diagnosis not present

## 2017-07-08 DIAGNOSIS — M5412 Radiculopathy, cervical region: Secondary | ICD-10-CM | POA: Diagnosis not present

## 2017-07-08 DIAGNOSIS — M9903 Segmental and somatic dysfunction of lumbar region: Secondary | ICD-10-CM | POA: Diagnosis not present

## 2017-07-15 DIAGNOSIS — M9903 Segmental and somatic dysfunction of lumbar region: Secondary | ICD-10-CM | POA: Diagnosis not present

## 2017-07-15 DIAGNOSIS — M50323 Other cervical disc degeneration at C6-C7 level: Secondary | ICD-10-CM | POA: Diagnosis not present

## 2017-07-15 DIAGNOSIS — M5412 Radiculopathy, cervical region: Secondary | ICD-10-CM | POA: Diagnosis not present

## 2017-07-15 DIAGNOSIS — M9901 Segmental and somatic dysfunction of cervical region: Secondary | ICD-10-CM | POA: Diagnosis not present

## 2017-07-22 DIAGNOSIS — M50323 Other cervical disc degeneration at C6-C7 level: Secondary | ICD-10-CM | POA: Diagnosis not present

## 2017-07-22 DIAGNOSIS — M9901 Segmental and somatic dysfunction of cervical region: Secondary | ICD-10-CM | POA: Diagnosis not present

## 2017-07-22 DIAGNOSIS — M5412 Radiculopathy, cervical region: Secondary | ICD-10-CM | POA: Diagnosis not present

## 2017-07-22 DIAGNOSIS — M9903 Segmental and somatic dysfunction of lumbar region: Secondary | ICD-10-CM | POA: Diagnosis not present

## 2017-07-30 DIAGNOSIS — M5412 Radiculopathy, cervical region: Secondary | ICD-10-CM | POA: Diagnosis not present

## 2017-07-30 DIAGNOSIS — M50323 Other cervical disc degeneration at C6-C7 level: Secondary | ICD-10-CM | POA: Diagnosis not present

## 2017-07-30 DIAGNOSIS — M9903 Segmental and somatic dysfunction of lumbar region: Secondary | ICD-10-CM | POA: Diagnosis not present

## 2017-07-30 DIAGNOSIS — M9901 Segmental and somatic dysfunction of cervical region: Secondary | ICD-10-CM | POA: Diagnosis not present

## 2017-08-10 DIAGNOSIS — M50323 Other cervical disc degeneration at C6-C7 level: Secondary | ICD-10-CM | POA: Diagnosis not present

## 2017-08-10 DIAGNOSIS — M5412 Radiculopathy, cervical region: Secondary | ICD-10-CM | POA: Diagnosis not present

## 2017-08-10 DIAGNOSIS — M9903 Segmental and somatic dysfunction of lumbar region: Secondary | ICD-10-CM | POA: Diagnosis not present

## 2017-08-10 DIAGNOSIS — M9901 Segmental and somatic dysfunction of cervical region: Secondary | ICD-10-CM | POA: Diagnosis not present

## 2017-08-12 DIAGNOSIS — M9903 Segmental and somatic dysfunction of lumbar region: Secondary | ICD-10-CM | POA: Diagnosis not present

## 2017-08-12 DIAGNOSIS — M50323 Other cervical disc degeneration at C6-C7 level: Secondary | ICD-10-CM | POA: Diagnosis not present

## 2017-08-12 DIAGNOSIS — M5412 Radiculopathy, cervical region: Secondary | ICD-10-CM | POA: Diagnosis not present

## 2017-08-12 DIAGNOSIS — M9901 Segmental and somatic dysfunction of cervical region: Secondary | ICD-10-CM | POA: Diagnosis not present

## 2017-08-14 DIAGNOSIS — M9901 Segmental and somatic dysfunction of cervical region: Secondary | ICD-10-CM | POA: Diagnosis not present

## 2017-08-14 DIAGNOSIS — M50323 Other cervical disc degeneration at C6-C7 level: Secondary | ICD-10-CM | POA: Diagnosis not present

## 2017-08-14 DIAGNOSIS — M5412 Radiculopathy, cervical region: Secondary | ICD-10-CM | POA: Diagnosis not present

## 2017-08-14 DIAGNOSIS — M9903 Segmental and somatic dysfunction of lumbar region: Secondary | ICD-10-CM | POA: Diagnosis not present

## 2017-08-26 DIAGNOSIS — M5412 Radiculopathy, cervical region: Secondary | ICD-10-CM | POA: Diagnosis not present

## 2017-08-26 DIAGNOSIS — M9903 Segmental and somatic dysfunction of lumbar region: Secondary | ICD-10-CM | POA: Diagnosis not present

## 2017-08-26 DIAGNOSIS — M9901 Segmental and somatic dysfunction of cervical region: Secondary | ICD-10-CM | POA: Diagnosis not present

## 2017-08-26 DIAGNOSIS — M50323 Other cervical disc degeneration at C6-C7 level: Secondary | ICD-10-CM | POA: Diagnosis not present

## 2017-09-04 DIAGNOSIS — M5412 Radiculopathy, cervical region: Secondary | ICD-10-CM | POA: Diagnosis not present

## 2017-09-07 ENCOUNTER — Other Ambulatory Visit: Payer: Self-pay | Admitting: Orthopedic Surgery

## 2017-09-08 ENCOUNTER — Other Ambulatory Visit: Payer: Self-pay | Admitting: Orthopedic Surgery

## 2017-09-08 DIAGNOSIS — M5412 Radiculopathy, cervical region: Secondary | ICD-10-CM

## 2017-09-19 ENCOUNTER — Ambulatory Visit
Admission: RE | Admit: 2017-09-19 | Discharge: 2017-09-19 | Disposition: A | Discharge status: Home / Self Care | Payer: Medicare Other | Source: Ambulatory Visit | Attending: Orthopedic Surgery | Admitting: Orthopedic Surgery

## 2017-09-19 DIAGNOSIS — M47812 Spondylosis without myelopathy or radiculopathy, cervical region: Secondary | ICD-10-CM | POA: Diagnosis not present

## 2017-09-19 DIAGNOSIS — M5412 Radiculopathy, cervical region: Secondary | ICD-10-CM

## 2017-09-28 DIAGNOSIS — M542 Cervicalgia: Secondary | ICD-10-CM | POA: Diagnosis not present

## 2017-10-02 DIAGNOSIS — M79642 Pain in left hand: Secondary | ICD-10-CM | POA: Diagnosis not present

## 2017-11-18 DIAGNOSIS — G5601 Carpal tunnel syndrome, right upper limb: Secondary | ICD-10-CM | POA: Diagnosis not present

## 2017-11-27 DIAGNOSIS — M5412 Radiculopathy, cervical region: Secondary | ICD-10-CM | POA: Diagnosis not present

## 2017-12-02 ENCOUNTER — Other Ambulatory Visit: Payer: Self-pay | Admitting: Obstetrics & Gynecology

## 2017-12-02 DIAGNOSIS — N3281 Overactive bladder: Secondary | ICD-10-CM

## 2017-12-02 DIAGNOSIS — G5601 Carpal tunnel syndrome, right upper limb: Secondary | ICD-10-CM | POA: Diagnosis not present

## 2017-12-02 DIAGNOSIS — G5602 Carpal tunnel syndrome, left upper limb: Secondary | ICD-10-CM | POA: Diagnosis not present

## 2017-12-23 DIAGNOSIS — E039 Hypothyroidism, unspecified: Secondary | ICD-10-CM | POA: Diagnosis not present

## 2017-12-23 DIAGNOSIS — E559 Vitamin D deficiency, unspecified: Secondary | ICD-10-CM | POA: Diagnosis not present

## 2017-12-23 DIAGNOSIS — N183 Chronic kidney disease, stage 3 (moderate): Secondary | ICD-10-CM | POA: Diagnosis not present

## 2017-12-23 DIAGNOSIS — E785 Hyperlipidemia, unspecified: Secondary | ICD-10-CM | POA: Diagnosis not present

## 2017-12-23 DIAGNOSIS — I129 Hypertensive chronic kidney disease with stage 1 through stage 4 chronic kidney disease, or unspecified chronic kidney disease: Secondary | ICD-10-CM | POA: Diagnosis not present

## 2017-12-23 LAB — BASIC METABOLIC PANEL
BUN: 16 (ref 4–21)
Creatinine: 1.1 (ref 0.5–1.1)
Glucose: 104
Potassium: 4.8 (ref 3.4–5.3)
Sodium: 136 — AB (ref 137–147)

## 2017-12-23 LAB — VITAMIN D 25 HYDROXY (VIT D DEFICIENCY, FRACTURES): Vit D, 25-Hydroxy: 50.9

## 2017-12-23 LAB — HEPATIC FUNCTION PANEL
ALT: 11 (ref 7–35)
AST: 16 (ref 13–35)
Alkaline Phosphatase: 89 (ref 25–125)
Bilirubin, Total: 0.3

## 2017-12-23 LAB — TSH: TSH: 1.41 (ref 0.41–5.90)

## 2017-12-23 LAB — LIPID PANEL
Cholesterol: 193 (ref 0–200)
HDL: 50 (ref 35–70)
LDL Cholesterol: 122
LDl/HDL Ratio: 2.4
Triglycerides: 107 (ref 40–160)

## 2017-12-23 LAB — CBC AND DIFFERENTIAL
HCT: 38 (ref 36–46)
Hemoglobin: 12.5 (ref 12.0–16.0)
Platelets: 374 (ref 150–399)
WBC: 7.6

## 2017-12-25 DIAGNOSIS — G5602 Carpal tunnel syndrome, left upper limb: Secondary | ICD-10-CM | POA: Diagnosis not present

## 2018-01-06 DIAGNOSIS — G5602 Carpal tunnel syndrome, left upper limb: Secondary | ICD-10-CM | POA: Diagnosis not present

## 2018-01-29 DIAGNOSIS — G5601 Carpal tunnel syndrome, right upper limb: Secondary | ICD-10-CM | POA: Diagnosis not present

## 2018-02-17 DIAGNOSIS — M65331 Trigger finger, right middle finger: Secondary | ICD-10-CM | POA: Diagnosis not present

## 2018-02-17 DIAGNOSIS — G5601 Carpal tunnel syndrome, right upper limb: Secondary | ICD-10-CM | POA: Diagnosis not present

## 2018-03-24 DIAGNOSIS — G5601 Carpal tunnel syndrome, right upper limb: Secondary | ICD-10-CM | POA: Diagnosis not present

## 2018-03-24 DIAGNOSIS — G5602 Carpal tunnel syndrome, left upper limb: Secondary | ICD-10-CM | POA: Diagnosis not present

## 2018-03-24 DIAGNOSIS — M65331 Trigger finger, right middle finger: Secondary | ICD-10-CM | POA: Diagnosis not present

## 2018-05-12 DIAGNOSIS — M542 Cervicalgia: Secondary | ICD-10-CM | POA: Diagnosis not present

## 2018-05-12 DIAGNOSIS — M791 Myalgia, unspecified site: Secondary | ICD-10-CM | POA: Diagnosis not present

## 2018-05-12 DIAGNOSIS — M25561 Pain in right knee: Secondary | ICD-10-CM | POA: Diagnosis not present

## 2018-05-12 DIAGNOSIS — M50223 Other cervical disc displacement at C6-C7 level: Secondary | ICD-10-CM | POA: Diagnosis not present

## 2018-05-20 DIAGNOSIS — R293 Abnormal posture: Secondary | ICD-10-CM | POA: Diagnosis not present

## 2018-05-20 DIAGNOSIS — M25561 Pain in right knee: Secondary | ICD-10-CM | POA: Diagnosis not present

## 2018-05-20 DIAGNOSIS — M1711 Unilateral primary osteoarthritis, right knee: Secondary | ICD-10-CM | POA: Diagnosis not present

## 2018-05-20 DIAGNOSIS — M6240 Contracture of muscle, unspecified site: Secondary | ICD-10-CM | POA: Diagnosis not present

## 2018-05-20 DIAGNOSIS — M9901 Segmental and somatic dysfunction of cervical region: Secondary | ICD-10-CM | POA: Diagnosis not present

## 2018-05-20 DIAGNOSIS — M50322 Other cervical disc degeneration at C5-C6 level: Secondary | ICD-10-CM | POA: Diagnosis not present

## 2018-05-24 DIAGNOSIS — G5601 Carpal tunnel syndrome, right upper limb: Secondary | ICD-10-CM | POA: Diagnosis not present

## 2018-05-24 DIAGNOSIS — G5602 Carpal tunnel syndrome, left upper limb: Secondary | ICD-10-CM | POA: Diagnosis not present

## 2018-05-24 DIAGNOSIS — M65331 Trigger finger, right middle finger: Secondary | ICD-10-CM | POA: Diagnosis not present

## 2018-05-27 DIAGNOSIS — M6240 Contracture of muscle, unspecified site: Secondary | ICD-10-CM | POA: Diagnosis not present

## 2018-05-27 DIAGNOSIS — M25561 Pain in right knee: Secondary | ICD-10-CM | POA: Diagnosis not present

## 2018-05-27 DIAGNOSIS — R293 Abnormal posture: Secondary | ICD-10-CM | POA: Diagnosis not present

## 2018-05-27 DIAGNOSIS — M50322 Other cervical disc degeneration at C5-C6 level: Secondary | ICD-10-CM | POA: Diagnosis not present

## 2018-05-27 DIAGNOSIS — M9901 Segmental and somatic dysfunction of cervical region: Secondary | ICD-10-CM | POA: Diagnosis not present

## 2018-05-27 DIAGNOSIS — M1711 Unilateral primary osteoarthritis, right knee: Secondary | ICD-10-CM | POA: Diagnosis not present

## 2018-06-03 DIAGNOSIS — M6240 Contracture of muscle, unspecified site: Secondary | ICD-10-CM | POA: Diagnosis not present

## 2018-06-03 DIAGNOSIS — M9901 Segmental and somatic dysfunction of cervical region: Secondary | ICD-10-CM | POA: Diagnosis not present

## 2018-06-03 DIAGNOSIS — M25561 Pain in right knee: Secondary | ICD-10-CM | POA: Diagnosis not present

## 2018-06-03 DIAGNOSIS — R293 Abnormal posture: Secondary | ICD-10-CM | POA: Diagnosis not present

## 2018-06-03 DIAGNOSIS — M50322 Other cervical disc degeneration at C5-C6 level: Secondary | ICD-10-CM | POA: Diagnosis not present

## 2018-06-03 DIAGNOSIS — M1711 Unilateral primary osteoarthritis, right knee: Secondary | ICD-10-CM | POA: Diagnosis not present

## 2018-06-11 DIAGNOSIS — M50322 Other cervical disc degeneration at C5-C6 level: Secondary | ICD-10-CM | POA: Diagnosis not present

## 2018-06-11 DIAGNOSIS — M791 Myalgia, unspecified site: Secondary | ICD-10-CM | POA: Diagnosis not present

## 2018-06-11 DIAGNOSIS — M6240 Contracture of muscle, unspecified site: Secondary | ICD-10-CM | POA: Diagnosis not present

## 2018-06-11 DIAGNOSIS — R293 Abnormal posture: Secondary | ICD-10-CM | POA: Diagnosis not present

## 2018-06-11 DIAGNOSIS — M9901 Segmental and somatic dysfunction of cervical region: Secondary | ICD-10-CM | POA: Diagnosis not present

## 2018-06-11 DIAGNOSIS — M542 Cervicalgia: Secondary | ICD-10-CM | POA: Diagnosis not present

## 2018-06-12 ENCOUNTER — Other Ambulatory Visit: Payer: Self-pay | Admitting: Nurse Practitioner

## 2018-06-17 DIAGNOSIS — M791 Myalgia, unspecified site: Secondary | ICD-10-CM | POA: Diagnosis not present

## 2018-06-17 DIAGNOSIS — M6240 Contracture of muscle, unspecified site: Secondary | ICD-10-CM | POA: Diagnosis not present

## 2018-06-17 DIAGNOSIS — M50322 Other cervical disc degeneration at C5-C6 level: Secondary | ICD-10-CM | POA: Diagnosis not present

## 2018-06-17 DIAGNOSIS — M9901 Segmental and somatic dysfunction of cervical region: Secondary | ICD-10-CM | POA: Diagnosis not present

## 2018-06-17 DIAGNOSIS — M542 Cervicalgia: Secondary | ICD-10-CM | POA: Diagnosis not present

## 2018-06-17 DIAGNOSIS — R293 Abnormal posture: Secondary | ICD-10-CM | POA: Diagnosis not present

## 2018-06-19 DIAGNOSIS — Z23 Encounter for immunization: Secondary | ICD-10-CM | POA: Diagnosis not present

## 2018-06-23 ENCOUNTER — Other Ambulatory Visit: Payer: Self-pay | Admitting: Internal Medicine

## 2018-06-23 DIAGNOSIS — N3281 Overactive bladder: Secondary | ICD-10-CM

## 2018-06-24 DIAGNOSIS — R293 Abnormal posture: Secondary | ICD-10-CM | POA: Diagnosis not present

## 2018-06-24 DIAGNOSIS — M6240 Contracture of muscle, unspecified site: Secondary | ICD-10-CM | POA: Diagnosis not present

## 2018-06-24 DIAGNOSIS — M9901 Segmental and somatic dysfunction of cervical region: Secondary | ICD-10-CM | POA: Diagnosis not present

## 2018-06-24 DIAGNOSIS — M542 Cervicalgia: Secondary | ICD-10-CM | POA: Diagnosis not present

## 2018-06-24 DIAGNOSIS — M50322 Other cervical disc degeneration at C5-C6 level: Secondary | ICD-10-CM | POA: Diagnosis not present

## 2018-06-24 DIAGNOSIS — M791 Myalgia, unspecified site: Secondary | ICD-10-CM | POA: Diagnosis not present

## 2018-06-25 ENCOUNTER — Encounter: Payer: Self-pay | Admitting: Nurse Practitioner

## 2018-06-25 ENCOUNTER — Ambulatory Visit (INDEPENDENT_AMBULATORY_CARE_PROVIDER_SITE_OTHER): Payer: Medicare Other | Admitting: Nurse Practitioner

## 2018-06-25 VITALS — BP 130/72 | HR 57 | Temp 98.5°F | Ht 63.0 in | Wt 149.6 lb

## 2018-06-25 DIAGNOSIS — E039 Hypothyroidism, unspecified: Secondary | ICD-10-CM | POA: Diagnosis not present

## 2018-06-25 DIAGNOSIS — N3281 Overactive bladder: Secondary | ICD-10-CM

## 2018-06-25 DIAGNOSIS — E785 Hyperlipidemia, unspecified: Secondary | ICD-10-CM | POA: Insufficient documentation

## 2018-06-25 DIAGNOSIS — R7303 Prediabetes: Secondary | ICD-10-CM | POA: Diagnosis not present

## 2018-06-25 DIAGNOSIS — N1832 Chronic kidney disease, stage 3b: Secondary | ICD-10-CM | POA: Insufficient documentation

## 2018-06-25 DIAGNOSIS — I10 Essential (primary) hypertension: Secondary | ICD-10-CM | POA: Diagnosis not present

## 2018-06-25 DIAGNOSIS — I129 Hypertensive chronic kidney disease with stage 1 through stage 4 chronic kidney disease, or unspecified chronic kidney disease: Secondary | ICD-10-CM

## 2018-06-25 DIAGNOSIS — I130 Hypertensive heart and chronic kidney disease with heart failure and stage 1 through stage 4 chronic kidney disease, or unspecified chronic kidney disease: Secondary | ICD-10-CM | POA: Insufficient documentation

## 2018-06-25 DIAGNOSIS — N183 Chronic kidney disease, stage 3 unspecified: Secondary | ICD-10-CM | POA: Insufficient documentation

## 2018-06-25 DIAGNOSIS — E559 Vitamin D deficiency, unspecified: Secondary | ICD-10-CM | POA: Insufficient documentation

## 2018-06-25 NOTE — Progress Notes (Signed)
Subjective:     Patient ID: Abigail [name] , female    DOB: 09-15-1935 , 82 y.o.   MRN: [id number]   Chief Complaint  Patient presents with  . Hypothyroidism    HPI  Hyperlipidemia - reports compliance to medications.  At times will miss her evening dose after working.    Thyroid Problem  Presents for follow-up visit. Patient reports no anxiety, fatigue, weight gain or weight loss. The symptoms have been stable.     Past Medical History:  Diagnosis Date  . Carotid artery occlusion   . Complication of anesthesia    hard to wake up per pt  . Diverticulitis   . Hypertension   . Hypothyroid      No family history on file.   Current Outpatient Medications:  .  aspirin 81 MG chewable tablet, Chew by mouth daily., Disp: , Rfl:  .  Cholecalciferol (VITAMIN D-3 PO), Take 1 drop by mouth daily. Uses liquid via  dropper daily, Disp: , Rfl:  .  diltiazem (DILACOR XR) 180 MG 24 hr capsule, Take 180 mg by mouth daily., Disp: , Rfl:  .  levothyroxine (SYNTHROID, LEVOTHROID) 175 MCG tablet, Take 175 mcg by mouth daily before breakfast., Disp: , Rfl:  .  levothyroxine (SYNTHROID, LEVOTHROID) 200 MCG tablet, Take 200 mcg by mouth daily. Pt alternates between 175 mcg and the 200 mcg daily, Disp: , Rfl:  .  lisinopril-hydrochlorothiazide (PRINZIDE,ZESTORETIC) 20-25 MG tablet, Take 1 tablet by mouth daily., Disp: , Rfl: 5 .  Multiple Vitamin (MULTIVITAMIN) tablet, Take 1 tablet by mouth daily., Disp: , Rfl:  .  oxybutynin (DITROPAN-XL) 10 MG 24 hr tablet, TAKE 1 TABLET BY MOUTH EVERY DAY, Disp: 90 tablet, Rfl: 1 .  pravastatin (PRAVACHOL) 20 MG tablet, TAKE 1 TABLET BY MOUTH EVERY DAY, Disp: 90 tablet, Rfl: 1 .  senna (SENOKOT) 8.6 MG tablet, Take 1 tablet by mouth daily., Disp: , Rfl:   Current Facility-Administered Medications:  .  Ring Pessary/Support MISC 1 each, 1 each, Does not apply, Once, Anyanwu, Ugonna A, MD   Allergies  Allergen Reactions  . Codeine Nausea And Vomiting      Review of Systems  Constitutional: Negative for fatigue, weight gain and weight loss.  HENT: Negative.   Respiratory: Negative.   Cardiovascular: Negative.   Musculoskeletal: Negative.   Skin: Negative.   Neurological: Negative.   Psychiatric/Behavioral: The patient is not nervous/anxious.      Today's Vitals   06/25/18 0848  BP: 130/72  Pulse: (!) 57  Temp: 98.5 F (36.9 C)  TempSrc: Oral  SpO2: 94%  Weight: 149 lb 9.6 oz (67.9 kg)  Height: _0  (1.6 m)  PainSc: 0-No pain   Body mass index is 26.5 kg/m.   Objective:  Physical Exam  Constitutional: She is oriented to person, place, and time. She appears well-developed and well-nourished.  Cardiovascular: Normal rate, regular rhythm and normal heart sounds.  Pulmonary/Chest: Effort normal and breath sounds normal.  Neurological: She is alert and oriented to person, place, and time.  Skin: Skin is warm and dry.  Psychiatric: She has a normal mood and affect.        Assessment And Plan:     1. Essential hypertension  Chronic, controlled  Continue with current medications - BMP8+eGFR  2. Acquired hypothyroidism  Chronic, controlled  Continue with current medications - TSH - T3, free - T4  3. Hyperlipidemia, unspecified hyperlipidemia type  Chronic, controlled  Continue with current medications  Encouraged to increase fiber intake. - Lipid Profile  4. Overactive bladder  Chronic, controlled  Continue with current medications  5. Prediabetes  Chronic, controlled  No current medications  Encouraged to limit intake of sugary foods and drinks and decrease carbohydrate intake. - Hemoglobin A1c    [name], FNP

## 2018-06-26 LAB — TSH: TSH: 1.38 u[IU]/mL (ref 0.450–4.500)

## 2018-06-26 LAB — BMP8+EGFR
BUN/Creatinine Ratio: 18 (ref 12–28)
BUN: 20 mg/dL (ref 8–27)
CO2: 22 mmol/L (ref 20–29)
Calcium: 9.5 mg/dL (ref 8.7–10.3)
Chloride: 96 mmol/L (ref 96–106)
Creatinine, Ser: 1.11 mg/dL — ABNORMAL HIGH (ref 0.57–1.00)
GFR calc Af Amer: 54 mL/min/{1.73_m2} — ABNORMAL LOW (ref >59)
GFR calc non Af Amer: 47 mL/min/{1.73_m2} — ABNORMAL LOW (ref >59)
Glucose: 93 mg/dL (ref 65–99)
Potassium: 4.9 mmol/L (ref 3.5–5.2)
Sodium: 131 mmol/L — ABNORMAL LOW (ref 134–144)

## 2018-06-26 LAB — HEMOGLOBIN A1C
Est. average glucose Bld gHb Est-mCnc: 120 mg/dL
Hgb A1c MFr Bld: 5.8 % — ABNORMAL HIGH (ref 4.8–5.6)

## 2018-06-26 LAB — LIPID PANEL
Chol/HDL Ratio: 3.8 ratio (ref 0.0–4.4)
Cholesterol, Total: 186 mg/dL (ref 100–199)
HDL: 49 mg/dL (ref >39)
LDL Calculated: 121 mg/dL — ABNORMAL HIGH (ref 0–99)
Triglycerides: 80 mg/dL (ref 0–149)
VLDL Cholesterol Cal: 16 mg/dL (ref 5–40)

## 2018-06-26 LAB — T3, FREE: T3, Free: 2.6 pg/mL (ref 2.0–4.4)

## 2018-06-26 LAB — T4: T4, Total: 9.1 ug/dL (ref 4.5–12.0)

## 2018-07-01 DIAGNOSIS — M6240 Contracture of muscle, unspecified site: Secondary | ICD-10-CM | POA: Diagnosis not present

## 2018-07-01 DIAGNOSIS — M9901 Segmental and somatic dysfunction of cervical region: Secondary | ICD-10-CM | POA: Diagnosis not present

## 2018-07-01 DIAGNOSIS — M542 Cervicalgia: Secondary | ICD-10-CM | POA: Diagnosis not present

## 2018-07-01 DIAGNOSIS — M50223 Other cervical disc displacement at C6-C7 level: Secondary | ICD-10-CM | POA: Diagnosis not present

## 2018-07-01 DIAGNOSIS — M50322 Other cervical disc degeneration at C5-C6 level: Secondary | ICD-10-CM | POA: Diagnosis not present

## 2018-07-01 DIAGNOSIS — M25561 Pain in right knee: Secondary | ICD-10-CM | POA: Diagnosis not present

## 2018-07-01 DIAGNOSIS — R293 Abnormal posture: Secondary | ICD-10-CM | POA: Diagnosis not present

## 2018-07-01 DIAGNOSIS — M791 Myalgia, unspecified site: Secondary | ICD-10-CM | POA: Diagnosis not present

## 2018-07-08 ENCOUNTER — Emergency Department (HOSPITAL_COMMUNITY): Payer: Medicare Other

## 2018-07-08 ENCOUNTER — Other Ambulatory Visit: Payer: Self-pay

## 2018-07-08 ENCOUNTER — Emergency Department (HOSPITAL_COMMUNITY)
Admission: EM | Admit: 2018-07-08 | Discharge: 2018-07-08 | Disposition: A | Discharge status: Home / Self Care | Payer: Medicare Other | Attending: Emergency Medicine | Admitting: Emergency Medicine

## 2018-07-08 ENCOUNTER — Encounter (HOSPITAL_COMMUNITY): Payer: Self-pay | Admitting: Emergency Medicine

## 2018-07-08 DIAGNOSIS — I10 Essential (primary) hypertension: Secondary | ICD-10-CM | POA: Diagnosis not present

## 2018-07-08 DIAGNOSIS — K449 Diaphragmatic hernia without obstruction or gangrene: Secondary | ICD-10-CM | POA: Diagnosis not present

## 2018-07-08 DIAGNOSIS — N390 Urinary tract infection, site not specified: Secondary | ICD-10-CM | POA: Insufficient documentation

## 2018-07-08 DIAGNOSIS — R110 Nausea: Secondary | ICD-10-CM | POA: Diagnosis not present

## 2018-07-08 DIAGNOSIS — E860 Dehydration: Secondary | ICD-10-CM | POA: Diagnosis not present

## 2018-07-08 DIAGNOSIS — N183 Chronic kidney disease, stage 3 (moderate): Secondary | ICD-10-CM | POA: Diagnosis not present

## 2018-07-08 DIAGNOSIS — E039 Hypothyroidism, unspecified: Secondary | ICD-10-CM | POA: Diagnosis not present

## 2018-07-08 DIAGNOSIS — R55 Syncope and collapse: Secondary | ICD-10-CM | POA: Diagnosis not present

## 2018-07-08 DIAGNOSIS — Z79899 Other long term (current) drug therapy: Secondary | ICD-10-CM | POA: Diagnosis not present

## 2018-07-08 DIAGNOSIS — I129 Hypertensive chronic kidney disease with stage 1 through stage 4 chronic kidney disease, or unspecified chronic kidney disease: Secondary | ICD-10-CM | POA: Diagnosis not present

## 2018-07-08 DIAGNOSIS — Z87891 Personal history of nicotine dependence: Secondary | ICD-10-CM | POA: Insufficient documentation

## 2018-07-08 DIAGNOSIS — R531 Weakness: Secondary | ICD-10-CM | POA: Diagnosis not present

## 2018-07-08 DIAGNOSIS — I6523 Occlusion and stenosis of bilateral carotid arteries: Secondary | ICD-10-CM | POA: Diagnosis not present

## 2018-07-08 DIAGNOSIS — Z7982 Long term (current) use of aspirin: Secondary | ICD-10-CM | POA: Insufficient documentation

## 2018-07-08 LAB — URINALYSIS, ROUTINE W REFLEX MICROSCOPIC
Bilirubin Urine: NEGATIVE
Glucose, UA: NEGATIVE mg/dL
Hgb urine dipstick: NEGATIVE
Ketones, ur: NEGATIVE mg/dL
Nitrite: POSITIVE — AB
Protein, ur: NEGATIVE mg/dL
Specific Gravity, Urine: 1.009 (ref 1.005–1.030)
pH: 6 (ref 5.0–8.0)

## 2018-07-08 LAB — I-STAT TROPONIN, ED: Troponin i, poc: 0 ng/mL (ref 0.00–0.08)

## 2018-07-08 LAB — BASIC METABOLIC PANEL
Anion gap: 10 (ref 5–15)
BUN: 19 mg/dL (ref 8–23)
CO2: 26 mmol/L (ref 22–32)
Calcium: 9.3 mg/dL (ref 8.9–10.3)
Chloride: 98 mmol/L (ref 98–111)
Creatinine, Ser: 1.1 mg/dL — ABNORMAL HIGH (ref 0.44–1.00)
GFR calc Af Amer: 53 mL/min — ABNORMAL LOW (ref >60)
GFR calc non Af Amer: 46 mL/min — ABNORMAL LOW (ref >60)
Glucose, Bld: 119 mg/dL — ABNORMAL HIGH (ref 70–99)
Potassium: 4.3 mmol/L (ref 3.5–5.1)
Sodium: 134 mmol/L — ABNORMAL LOW (ref 135–145)

## 2018-07-08 LAB — CBC
HCT: 40 % (ref 36.0–46.0)
Hemoglobin: 12.8 g/dL (ref 12.0–15.0)
MCH: 30.3 pg (ref 26.0–34.0)
MCHC: 32 g/dL (ref 30.0–36.0)
MCV: 94.8 fL (ref 80.0–100.0)
Platelets: 341 10*3/uL (ref 150–400)
RBC: 4.22 MIL/uL (ref 3.87–5.11)
RDW: 13.2 % (ref 11.5–15.5)
WBC: 14.3 10*3/uL — ABNORMAL HIGH (ref 4.0–10.5)
nRBC: 0 % (ref 0.0–0.2)

## 2018-07-08 LAB — MAGNESIUM: Magnesium: 2 mg/dL (ref 1.7–2.4)

## 2018-07-08 LAB — TSH: TSH: 2.034 u[IU]/mL (ref 0.350–4.500)

## 2018-07-08 LAB — CBG MONITORING, ED: Glucose-Capillary: 129 mg/dL — ABNORMAL HIGH (ref 70–99)

## 2018-07-08 MED ORDER — IOPAMIDOL (ISOVUE-370) INJECTION 76%
INTRAVENOUS | Status: AC
  Administered 2018-07-08: 12:00:00
  Filled 2018-07-08: qty 100

## 2018-07-08 MED ORDER — SODIUM CHLORIDE (PF) 0.9 % IJ SOLN
INTRAMUSCULAR | Status: AC
  Administered 2018-07-08: 12:00:00
  Filled 2018-07-08: qty 50

## 2018-07-08 MED ORDER — SODIUM CHLORIDE 0.9 % IV BOLUS
1000.0000 mL | Freq: Once | INTRAVENOUS | Status: AC
  Administered 2018-07-08: 1000 mL via INTRAVENOUS

## 2018-07-08 MED ORDER — IOPAMIDOL (ISOVUE-370) INJECTION 76%
100.0000 mL | Freq: Once | INTRAVENOUS | Status: AC | PRN
  Administered 2018-07-08: 100 mL via INTRAVENOUS

## 2018-07-08 MED ORDER — CEPHALEXIN 500 MG PO CAPS
500.0000 mg | ORAL_CAPSULE | Freq: Once | ORAL | Status: AC
  Administered 2018-07-08: 500 mg via ORAL
  Filled 2018-07-08: qty 1

## 2018-07-08 MED ORDER — CEPHALEXIN 500 MG PO CAPS: 500.0000 mg | ORAL_CAPSULE | Freq: Two times a day (BID) | ORAL | 0 refills | Status: AC

## 2018-07-08 NOTE — ED Notes (Signed)
Tegeler, MD at bedside at this time.

## 2018-07-08 NOTE — Discharge Instructions (Addendum)
Your work-up today showed evidence of UTI and dehydration which may contribute to your symptoms.  We did a work-up including imaging of your head and neck did not show significant stenosis or abnormalities.  Please follow-up with your PCP and neurologist for further management of this.  Please stay hydrated.  Please take the antibiotics for the next week to treat the UTI.  We discussed the possibility of admitting you to the hospital due to the syncopal episode however since you are feeling better and were able to ambulate without significant vital sign abnormalities, we feel you are safe for discharge home.  Please return to the nearest emergency department if any symptoms change worsen or recur.

## 2018-07-08 NOTE — ED Notes (Signed)
Bed: WA06 Expected date:  Expected time:  Means of arrival:  Comments: EMS 
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
## 2018-07-08 NOTE — ED Notes (Signed)
Ambulatory pulse ox >97 HR 73
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
## 2018-07-10 LAB — URINE CULTURE: Culture: >=100000 — AB

## 2018-07-11 ENCOUNTER — Telehealth: Payer: Self-pay

## 2018-07-11 NOTE — Telephone Encounter (Signed)
Post ED Visit - Positive Culture Follow-up  Culture report reviewed by antimicrobial stewardship pharmacist:  []  Elenor Quinones, Pharm.D. []  Heide Guile, Pharm.D., BCPS AQ-ID [x]  Parks Neptune, Pharm.D., BCPS []  Alycia Rossetti, Pharm.D., BCPS []  [place], Pharm.D., BCPS, AAHIVP []  Legrand Como, Pharm.D., BCPS, AAHIVP []  Salome Arnt, PharmD, BCPS []  Johnnette Gourd, PharmD, BCPS []  Hughes Better, PharmD, BCPS []  Leeroy Cha, PharmD  Positive urine culture Treated with Cephalexin, organism sensitive to the same and no further patient follow-up is required at this time.  Genia Del 07/11/2018, 11:12 AM

## 2018-07-12 ENCOUNTER — Other Ambulatory Visit: Payer: Self-pay

## 2018-08-10 ENCOUNTER — Other Ambulatory Visit: Payer: Self-pay | Admitting: Nurse Practitioner

## 2018-08-27 ENCOUNTER — Ambulatory Visit (INDEPENDENT_AMBULATORY_CARE_PROVIDER_SITE_OTHER): Payer: Medicare Other | Admitting: Nurse Practitioner

## 2018-08-27 ENCOUNTER — Other Ambulatory Visit: Payer: Self-pay

## 2018-08-27 ENCOUNTER — Encounter: Payer: Self-pay | Admitting: Nurse Practitioner

## 2018-08-27 VITALS — BP 160/82 | HR 51 | Temp 98.2°F | Ht 60.6 in | Wt 147.4 lb

## 2018-08-27 DIAGNOSIS — I10 Essential (primary) hypertension: Secondary | ICD-10-CM | POA: Insufficient documentation

## 2018-08-27 DIAGNOSIS — Z09 Encounter for follow-up examination after completed treatment for conditions other than malignant neoplasm: Secondary | ICD-10-CM | POA: Diagnosis not present

## 2018-08-27 DIAGNOSIS — N390 Urinary tract infection, site not specified: Secondary | ICD-10-CM

## 2018-08-27 LAB — POCT URINALYSIS DIPSTICK
Bilirubin, UA: NEGATIVE
Glucose, UA: NEGATIVE
Ketones, UA: NEGATIVE
Nitrite, UA: POSITIVE
Protein, UA: NEGATIVE
Spec Grav, UA: 1.01 (ref 1.010–1.025)
Urobilinogen, UA: 0.2 E.U./dL
pH, UA: 6 (ref 5.0–8.0)

## 2018-08-27 MED ORDER — NITROFURANTOIN MONOHYD MACRO 100 MG PO CAPS: 100.0000 mg | ORAL_CAPSULE | Freq: Two times a day (BID) | ORAL | 0 refills | Status: DC

## 2018-08-27 MED ORDER — PRAVASTATIN SODIUM 20 MG PO TABS: 20.0000 mg | ORAL_TABLET | Freq: Every day | ORAL | 1 refills | Status: DC

## 2018-08-27 MED ORDER — LISINOPRIL-HYDROCHLOROTHIAZIDE 20-25 MG PO TABS: 1.0000 | ORAL_TABLET | Freq: Every day | ORAL | 1 refills | Status: DC

## 2018-08-27 MED ORDER — AMLODIPINE BESYLATE 2.5 MG PO TABS: 2.5000 mg | ORAL_TABLET | Freq: Every day | ORAL | 2 refills | Status: DC

## 2018-08-27 NOTE — Telephone Encounter (Signed)
Called patient to make her aware she continues to have a UTI, I sent a prescription for nitrofurotoin and sent urine culture.

## 2018-08-27 NOTE — Progress Notes (Signed)
Subjective:     Patient ID: Abigail [name] , female    DOB: [birth year] , 83 y.o.   MRN: [id number]   Chief Complaint  Patient presents with  . Hospitalization Follow-up    HPI  She is here today for hospital follow up after having syncopal episode and altered mental status. She was diagnosed with dehydration and urinary tract infection.  She was treated with Cephalexin.  She denies any new symptoms.   Hypertension  This is a chronic problem. The current episode started more than 1 year ago. The problem has been gradually worsening since onset. The problem is uncontrolled. Pertinent negatives include no anxiety, headaches or palpitations. There are no associated agents to hypertension. There are no known risk factors for coronary artery disease. Past treatments include ACE inhibitors and diuretics. The current treatment provides moderate improvement. There are no compliance problems.  There is no history of angina.     Past Medical History:  Diagnosis Date  . Carotid artery occlusion   . Complication of anesthesia    hard to wake up per pt  . Diverticulitis   . Hypertension   . Hypothyroid      No family history on file.   Current Outpatient Medications:  .  aspirin 81 MG chewable tablet, Chew 81 mg by mouth daily. , Disp: , Rfl:  .  diltiazem (CARDIZEM CD) 180 MG 24 hr capsule, Take 180 mg by mouth daily., Disp: , Rfl: 0 .  docusate sodium (COLACE) 100 MG capsule, Take 200 mg by mouth 2 (two) times daily as needed for mild constipation., Disp: , Rfl:  .  ibuprofen (ADVIL,MOTRIN) 200 MG tablet, Take 400 mg by mouth every 6 (six) hours as needed for headache or mild pain., Disp: , Rfl:  .  levothyroxine (SYNTHROID, LEVOTHROID) 175 MCG tablet, Take 175 mcg by mouth daily before breakfast., Disp: , Rfl:  .  levothyroxine (SYNTHROID, LEVOTHROID) 200 MCG tablet, TAKE ONE TABLET BY MOUTH DAILY EVERY OTHER DAY (OPPOSITE 175MCG), Disp: 30 tablet, Rfl: 0 .  lisinopril-hydrochlorothiazide  (PRINZIDE,ZESTORETIC) 20-25 MG tablet, Take 1 tablet by mouth daily., Disp: , Rfl: 5 .  Multiple Vitamin (MULTIVITAMIN) tablet, Take 1 tablet by mouth 2 (two) times daily. , Disp: , Rfl:  .  oxybutynin (DITROPAN-XL) 10 MG 24 hr tablet, TAKE 1 TABLET BY MOUTH EVERY DAY (Patient taking differently: Take 10 mg by mouth at bedtime. ), Disp: 90 tablet, Rfl: 1 .  pravastatin (PRAVACHOL) 20 MG tablet, TAKE 1 TABLET BY MOUTH EVERY DAY (Patient taking differently: Take 20 mg by mouth at bedtime. ), Disp: 90 tablet, Rfl: 1  Current Facility-Administered Medications:  .  Ring Pessary/Support MISC 1 each, 1 each, Does not apply, Once, Anyanwu, Sallyanne Havers, MD   Allergies  Allergen Reactions  . Codeine Nausea And Vomiting     Review of Systems  Respiratory: Negative.   Cardiovascular: Negative.  Negative for palpitations.  Endocrine: Negative for polydipsia, polyphagia and polyuria.  Genitourinary: Negative.   Musculoskeletal: Negative.   Neurological: Negative for dizziness and headaches.     Today's Vitals   08/27/18 1120  BP: (!) 160/82  Pulse: (!) 51  Temp: 98.2 F (36.8 C)  TempSrc: Oral  SpO2: 96%  Weight: 147 lb 6.4 oz (66.9 kg)  Height: 5' 0.6" (1.539 m)  PainSc: 0-No pain   Body mass index is 28.22 kg/m.   Objective:  Physical Exam Vitals signs reviewed.  Constitutional:      Appearance: Normal appearance.  She is well-developed.  HENT:     Head: Normocephalic and atraumatic.  Eyes:     Pupils: Pupils are equal, round, and reactive to light.  Cardiovascular:     Rate and Rhythm: Normal rate and regular rhythm.     Pulses: Normal pulses.     Heart sounds: Normal heart sounds. No murmur.  Pulmonary:     Effort: Pulmonary effort is normal.     Breath sounds: Normal breath sounds.  Musculoskeletal:        General: Tenderness: cervical and low back.  Skin:    General: Skin is warm and dry.     Capillary Refill: Capillary refill takes less than 2 seconds.  Neurological:      General: No focal deficit present.     Mental Status: She is alert and oriented to person, place, and time.     Cranial Nerves: No cranial nerve deficit.         Assessment And Plan:     1. Urinary tract infection without hematuria, site unspecified  Treated at ER for UTI with cephalexin  Will recheck her urine  2. Essential hypertension . B/P is elevated today.  . Encouraged to decrease any intake of salt and processed foods. . Added 2.5 mg amlodipine daily . The importance of regular exercise and dietary modification was stressed to the patient.      [name], FNP

## 2018-08-27 NOTE — Patient Instructions (Signed)
Hypertension Hypertension, commonly called high blood pressure, is when the force of blood pumping through the arteries is too strong. The arteries are the blood vessels that carry blood from the heart throughout the body. Hypertension forces the heart to work harder to pump blood and may cause arteries to become narrow or stiff. Having untreated or uncontrolled hypertension can cause heart attacks, strokes, kidney disease, and other problems. A blood pressure reading consists of a higher number over a lower number. Ideally, your blood pressure should be below 120/80. The first ("top") number is called the systolic pressure. It is a measure of the pressure in your arteries as your heart beats. The second ("bottom") number is called the diastolic pressure. It is a measure of the pressure in your arteries as the heart relaxes. What are the causes? The cause of this condition is not known. What increases the risk? Some risk factors for high blood pressure are under your control. Others are not. Factors you can change  Smoking.  Having type 2 diabetes mellitus, high cholesterol, or both.  Not getting enough exercise or physical activity.  Being overweight.  Having too much fat, sugar, calories, or salt (sodium) in your diet.  Drinking too much alcohol. Factors that are difficult or impossible to change  Having chronic kidney disease.  Having a family history of high blood pressure.  Age. Risk increases with age.  Race. You may be at higher risk if you are African-American.  Gender. Men are at higher risk than women before age 45. After age 65, women are at higher risk than men.  Having obstructive sleep apnea.  Stress. What are the signs or symptoms? Extremely high blood pressure (hypertensive crisis) may cause:  Headache.  Anxiety.  Shortness of breath.  Nosebleed.  Nausea and vomiting.  Severe chest pain.  Jerky movements you cannot control (seizures). How is this  diagnosed? This condition is diagnosed by measuring your blood pressure while you are seated, with your arm resting on a surface. The cuff of the blood pressure monitor will be placed directly against the skin of your upper arm at the level of your heart. It should be measured at least twice using the same arm. Certain conditions can cause a difference in blood pressure between your right and left arms. Certain factors can cause blood pressure readings to be lower or higher than normal (elevated) for a short period of time:  When your blood pressure is higher when you are in a health care provider's office than when you are at home, this is called white coat hypertension. Most people with this condition do not need medicines.  When your blood pressure is higher at home than when you are in a health care provider's office, this is called masked hypertension. Most people with this condition may need medicines to control blood pressure. If you have a high blood pressure reading during one visit or you have normal blood pressure with other risk factors:  You may be asked to return on a different day to have your blood pressure checked again.  You may be asked to monitor your blood pressure at home for 1 week or longer. If you are diagnosed with hypertension, you may have other blood or imaging tests to help your health care provider understand your overall risk for other conditions. How is this treated? This condition is treated by making healthy lifestyle changes, such as eating healthy foods, exercising more, and reducing your alcohol intake. Your health care provider   may prescribe medicine if lifestyle changes are not enough to get your blood pressure under control, and if:  Your systolic blood pressure is above 130.  Your diastolic blood pressure is above 80. Your personal target blood pressure may vary depending on your medical conditions, your age, and other factors. Follow these instructions  at home: Eating and drinking   Eat a diet that is high in fiber and potassium, and low in sodium, added sugar, and fat. An example eating plan is called the DASH (Dietary Approaches to Stop Hypertension) diet. To eat this way: ? Eat plenty of fresh fruits and vegetables. Try to fill half of your plate at each meal with fruits and vegetables. ? Eat whole grains, such as whole wheat pasta, brown rice, or whole grain bread. Fill about one quarter of your plate with whole grains. ? Eat or drink low-fat dairy products, such as skim milk or low-fat yogurt. ? Avoid fatty cuts of meat, processed or cured meats, and poultry with skin. Fill about one quarter of your plate with lean proteins, such as fish, chicken without skin, beans, eggs, and tofu. ? Avoid premade and processed foods. These tend to be higher in sodium, added sugar, and fat.  Reduce your daily sodium intake. Most people with hypertension should eat less than 1,500 mg of sodium a day.  Limit alcohol intake to no more than 1 drink a day for nonpregnant women and 2 drinks a day for men. One drink equals 12 oz of beer, 5 oz of wine, or 1 oz of hard liquor. Lifestyle   Work with your health care provider to maintain a healthy body weight or to lose weight. Ask what an ideal weight is for you.  Get at least 30 minutes of exercise that causes your heart to beat faster (aerobic exercise) most days of the week. Activities may include walking, swimming, or biking.  Include exercise to strengthen your muscles (resistance exercise), such as pilates or lifting weights, as part of your weekly exercise routine. Try to do these types of exercises for 30 minutes at least 3 days a week.  Do not use any products that contain nicotine or tobacco, such as cigarettes and e-cigarettes. If you need help quitting, ask your health care provider.  Monitor your blood pressure at home as told by your health care provider.  Keep all follow-up visits as told by  your health care provider. This is important. Medicines  Take over-the-counter and prescription medicines only as told by your health care provider. Follow directions carefully. Blood pressure medicines must be taken as prescribed.  Do not skip doses of blood pressure medicine. Doing this puts you at risk for problems and can make the medicine less effective.  Ask your health care provider about side effects or reactions to medicines that you should watch for. Contact a health care provider if:  You think you are having a reaction to a medicine you are taking.  You have headaches that keep coming back (recurring).  You feel dizzy.  You have swelling in your ankles.  You have trouble with your vision. Get help right away if:  You develop a severe headache or confusion.  You have unusual weakness or numbness.  You feel faint.  You have severe pain in your chest or abdomen.  You vomit repeatedly.  You have trouble breathing. Summary  Hypertension is when the force of blood pumping through your arteries is too strong. If this condition is not controlled, it   may put you at risk for serious complications.  Your personal target blood pressure may vary depending on your medical conditions, your age, and other factors. For most people, a normal blood pressure is less than 120/80.  Hypertension is treated with lifestyle changes, medicines, or a combination of both. Lifestyle changes include weight loss, eating a healthy, low-sodium diet, exercising more, and limiting alcohol. This information is not intended to replace advice given to you by your health care provider. Make sure you discuss any questions you have with your health care provider. Document Released: 08/11/2005 Document Revised: 07/09/2016 Document Reviewed: 07/09/2016 Elsevier Interactive Patient Education  2019 [place] DASH stands for "Dietary Approaches to Stop Hypertension." The DASH eating  plan is a healthy eating plan that has been shown to reduce high blood pressure (hypertension). It may also reduce your risk for type 2 diabetes, heart disease, and stroke. The DASH eating plan may also help with weight loss. What are tips for following this plan?  General guidelines  Avoid eating more than 2,300 mg (milligrams) of salt (sodium) a day. If you have hypertension, you may need to reduce your sodium intake to 1,500 mg a day.  Limit alcohol intake to no more than 1 drink a day for nonpregnant women and 2 drinks a day for men. One drink equals 12 oz of beer, 5 oz of wine, or 1 oz of hard liquor.  Work with your health care provider to maintain a healthy body weight or to lose weight. Ask what an ideal weight is for you.  Get at least 30 minutes of exercise that causes your heart to beat faster (aerobic exercise) most days of the week. Activities may include walking, swimming, or biking.  Work with your health care provider or diet and nutrition specialist (dietitian) to adjust your eating plan to your individual calorie needs. Reading food labels   Check food labels for the amount of sodium per serving. Choose foods with less than 5 percent of the Daily Value of sodium. Generally, foods with less than 300 mg of sodium per serving fit into this eating plan.  To find whole grains, look for the word "whole" as the first word in the ingredient list. Shopping  Buy products labeled as "low-sodium" or "no salt added."  Buy fresh foods. Avoid canned foods and premade or frozen meals. Cooking  Avoid adding salt when cooking. Use salt-free seasonings or herbs instead of table salt or sea salt. Check with your health care provider or pharmacist before using salt substitutes.  Do not fry foods. Cook foods using healthy methods such as baking, boiling, grilling, and broiling instead.  Cook with heart-healthy oils, such as olive, canola, soybean, or sunflower oil. Meal planning  Eat a  balanced diet that includes: ? 5 or more servings of fruits and vegetables each day. At each meal, try to fill half of your plate with fruits and vegetables. ? Up to 6-8 servings of whole grains each day. ? Less than 6 oz of lean meat, poultry, or fish each day. A 3-oz serving of meat is about the same size as a deck of cards. One egg equals 1 oz. ? 2 servings of low-fat dairy each day. ? A serving of nuts, seeds, or beans 5 times each week. ? Heart-healthy fats. Healthy fats called Omega-3 fatty acids are found in foods such as flaxseeds and coldwater fish, like sardines, salmon, and mackerel.  Limit how much you eat of the following: ?  Canned or prepackaged foods. ? Food that is high in trans fat, such as fried foods. ? Food that is high in saturated fat, such as fatty meat. ? Sweets, desserts, sugary drinks, and other foods with added sugar. ? Full-fat dairy products.  Do not salt foods before eating.  Try to eat at least 2 vegetarian meals each week.  Eat more home-cooked food and less restaurant, buffet, and fast food.  When eating at a restaurant, ask that your food be prepared with less salt or no salt, if possible. What foods are recommended? The items listed may not be a complete list. Talk with your dietitian about what dietary choices are best for you. Grains Whole-grain or whole-wheat bread. Whole-grain or whole-wheat pasta. Brown rice. [place]. Bulgur. Whole-grain and low-sodium cereals. Pita bread. Low-fat, low-sodium crackers. Whole-wheat flour tortillas. Vegetables Fresh or frozen vegetables (raw, steamed, roasted, or grilled). Low-sodium or reduced-sodium tomato and vegetable juice. Low-sodium or reduced-sodium tomato sauce and tomato paste. Low-sodium or reduced-sodium canned vegetables. Fruits All fresh, dried, or frozen fruit. Canned fruit in natural juice (without added sugar). Meat and other protein foods Skinless chicken or [place]. Ground chicken or  [place]. Pork with fat trimmed off. Fish and seafood. Egg whites. Dried beans, peas, or lentils. Unsalted nuts, nut butters, and seeds. Unsalted canned beans. Lean cuts of beef with fat trimmed off. Low-sodium, lean deli meat. Dairy Low-fat (1%) or fat-free (skim) milk. Fat-free, low-fat, or reduced-fat cheeses. Nonfat, low-sodium ricotta or cottage cheese. Low-fat or nonfat yogurt. Low-fat, low-sodium cheese. Fats and oils Soft margarine without trans fats. Vegetable oil. Low-fat, reduced-fat, or light mayonnaise and salad dressings (reduced-sodium). Canola, safflower, olive, soybean, and sunflower oils. Avocado. Seasoning and other foods Herbs. Spices. Seasoning mixes without salt. Unsalted popcorn and pretzels. Fat-free sweets. What foods are not recommended? The items listed may not be a complete list. Talk with your dietitian about what dietary choices are best for you. Grains Baked goods made with fat, such as croissants, muffins, or some breads. Dry pasta or rice meal packs. Vegetables Creamed or fried vegetables. Vegetables in a cheese sauce. Regular canned vegetables (not low-sodium or reduced-sodium). Regular canned tomato sauce and paste (not low-sodium or reduced-sodium). Regular tomato and vegetable juice (not low-sodium or reduced-sodium). Angie Fava. Olives. Fruits Canned fruit in a light or heavy syrup. Fried fruit. Fruit in cream or butter sauce. Meat and other protein foods Fatty cuts of meat. Ribs. Fried meat. Berniece Salines. Sausage. Bologna and other processed lunch meats. Salami. Fatback. Hotdogs. Bratwurst. Salted nuts and seeds. Canned beans with added salt. Canned or smoked fish. Whole eggs or egg yolks. Chicken or [place] with skin. Dairy Whole or 2% milk, cream, and half-and-half. Whole or full-fat cream cheese. Whole-fat or sweetened yogurt. Full-fat cheese. Nondairy creamers. Whipped toppings. Processed cheese and cheese spreads. Fats and oils Butter. Stick margarine. Lard.  Shortening. Ghee. Bacon fat. Tropical oils, such as coconut, palm kernel, or palm oil. Seasoning and other foods Salted popcorn and pretzels. Onion salt, garlic salt, seasoned salt, table salt, and sea salt. Worcestershire sauce. Tartar sauce. Barbecue sauce. Teriyaki sauce. Soy sauce, including reduced-sodium. Steak sauce. Canned and packaged gravies. Fish sauce. Oyster sauce. Cocktail sauce. Horseradish that you find on the shelf. Ketchup. Mustard. Meat flavorings and tenderizers. Bouillon cubes. Hot sauce and Tabasco sauce. Premade or packaged marinades. Premade or packaged taco seasonings. Relishes. Regular salad dressings. Where to find more information:  National Heart, Lung, and [place]: https://wilson-eaton.com/  American Heart Association: www.heart.org Summary  The DASH eating plan is a healthy eating plan that has been shown to reduce high blood pressure (hypertension). It may also reduce your risk for type 2 diabetes, heart disease, and stroke.  With the DASH eating plan, you should limit salt (sodium) intake to 2,300 mg a day. If you have hypertension, you may need to reduce your sodium intake to 1,500 mg a day.  When on the DASH eating plan, aim to eat more fresh fruits and vegetables, whole grains, lean proteins, low-fat dairy, and heart-healthy fats.  Work with your health care provider or diet and nutrition specialist (dietitian) to adjust your eating plan to your individual calorie needs. This information is not intended to replace advice given to you by your health care provider. Make sure you discuss any questions you have with your health care provider. Document Released: 07/31/2011 Document Revised: 08/04/2016 Document Reviewed: 08/04/2016 Elsevier Interactive Patient Education  2019 [employer].

## 2018-08-29 LAB — URINE CULTURE

## 2018-09-08 ENCOUNTER — Other Ambulatory Visit: Payer: Self-pay

## 2018-09-08 ENCOUNTER — Ambulatory Visit (INDEPENDENT_AMBULATORY_CARE_PROVIDER_SITE_OTHER): Payer: Medicare Other | Admitting: Internal Medicine

## 2018-09-08 ENCOUNTER — Encounter: Payer: Self-pay | Admitting: Internal Medicine

## 2018-09-08 VITALS — BP 128/64 | HR 67 | Temp 97.7°F | Ht 62.0 in | Wt 147.0 lb

## 2018-09-08 DIAGNOSIS — R233 Spontaneous ecchymoses: Secondary | ICD-10-CM | POA: Diagnosis not present

## 2018-09-08 DIAGNOSIS — N390 Urinary tract infection, site not specified: Secondary | ICD-10-CM | POA: Diagnosis not present

## 2018-09-08 DIAGNOSIS — T465X5A Adverse effect of other antihypertensive drugs, initial encounter: Secondary | ICD-10-CM | POA: Diagnosis not present

## 2018-09-08 DIAGNOSIS — T7840XA Allergy, unspecified, initial encounter: Secondary | ICD-10-CM

## 2018-09-08 LAB — CMP14 + ANION GAP
ALT: 13 IU/L (ref 0–32)
AST: 17 IU/L (ref 0–40)
Albumin/Globulin Ratio: 1.9 (ref 1.2–2.2)
Albumin: 4.5 g/dL (ref 3.5–4.7)
Alkaline Phosphatase: 86 IU/L (ref 39–117)
Anion Gap: 12 mmol/L (ref 10.0–18.0)
BUN/Creatinine Ratio: 15 (ref 12–28)
BUN: 16 mg/dL (ref 8–27)
Bilirubin Total: 0.3 mg/dL (ref 0.0–1.2)
CO2: 24 mmol/L (ref 20–29)
Calcium: 9.8 mg/dL (ref 8.7–10.3)
Chloride: 94 mmol/L — ABNORMAL LOW (ref 96–106)
Creatinine, Ser: 1.07 mg/dL — ABNORMAL HIGH (ref 0.57–1.00)
GFR calc Af Amer: 57 mL/min/{1.73_m2} — ABNORMAL LOW (ref >59)
GFR calc non Af Amer: 49 mL/min/{1.73_m2} — ABNORMAL LOW (ref >59)
Globulin, Total: 2.4 g/dL (ref 1.5–4.5)
Glucose: 124 mg/dL — ABNORMAL HIGH (ref 65–99)
Potassium: 4.2 mmol/L (ref 3.5–5.2)
Sodium: 130 mmol/L — ABNORMAL LOW (ref 134–144)
Total Protein: 6.9 g/dL (ref 6.0–8.5)

## 2018-09-08 LAB — CBC WITH DIFFERENTIAL/PLATELET
Basophils Absolute: 0 10*3/uL (ref 0.0–0.2)
Basos: 1 %
EOS (ABSOLUTE): 0.3 10*3/uL (ref 0.0–0.4)
Eos: 3 %
Hematocrit: 35.2 % (ref 34.0–46.6)
Hemoglobin: 11.9 g/dL (ref 11.1–15.9)
Immature Grans (Abs): 0 10*3/uL (ref 0.0–0.1)
Immature Granulocytes: 0 %
Lymphocytes Absolute: 1 10*3/uL (ref 0.7–3.1)
Lymphs: 12 %
MCH: 30.4 pg (ref 26.6–33.0)
MCHC: 33.8 g/dL (ref 31.5–35.7)
MCV: 90 fL (ref 79–97)
Monocytes Absolute: 0.9 10*3/uL (ref 0.1–0.9)
Monocytes: 10 %
Neutrophils Absolute: 6.2 10*3/uL (ref 1.4–7.0)
Neutrophils: 74 %
Platelets: 340 10*3/uL (ref 150–450)
RBC: 3.92 x10E6/uL (ref 3.77–5.28)
RDW: 12.3 % (ref 11.7–15.4)
WBC: 8.4 10*3/uL (ref 3.4–10.8)

## 2018-09-08 MED ORDER — CEFTRIAXONE SODIUM 500 MG IJ SOLR
500.0000 mg | Freq: Once | INTRAMUSCULAR | Status: AC
  Administered 2018-09-08: 500 mg via INTRAMUSCULAR

## 2018-09-08 NOTE — Progress Notes (Signed)
* Subjective:     Patient ID: Abigail [name] , female    DOB: [birth year] , 83 y.o.   MRN: [id number]   Chief Complaint  Patient presents with  . Rash    only on legs- says its due to amlodopine- states it makes her confused- sleeps in recliner    HPI  Onset of lower legs aching on the second day she took the amlodipine initially 1/5 and the next am felt lower leg weakness, and after 2 days, on 1/7 , her whole body felt like she could not move, but this is better. She was still able to go to work. But is noticing she is forgetful. Sunday am woke with red patches on lower legs and felt " prikly" and not painful. In hte past 24 hours the rash has changed to red tiny dots. The redness is not as red. Yesterday after taking a dose of the Norvasc as told, broke out with 2 red flat lesions on her R chin. Saturday am she had sensation her throat was closing and like she needed to drink water, but could not get any water right away and she continued shopping. Today she denies feeling that discomfort.  During this time, it was noted that pt was also on Macrobid for UTI, but had finished it when she got the rash.   Past Medical History:  Diagnosis Date  . Carotid artery occlusion   . Complication of anesthesia    hard to wake up per pt  . Diverticulitis   . Hypertension   . Hypothyroid      No family history on file.   Current Outpatient Medications:  .  amLODipine (NORVASC) 2.5 MG tablet, Take 1 tablet (2.5 mg total) by mouth daily., Disp: 30 tablet, Rfl: 2 .  aspirin 81 MG chewable tablet, Chew 81 mg by mouth daily. , Disp: , Rfl:  .  diltiazem (CARDIZEM CD) 180 MG 24 hr capsule, Take 180 mg by mouth daily., Disp: , Rfl: 0 .  docusate sodium (COLACE) 100 MG capsule, Take 200 mg by mouth 2 (two) times daily as needed for mild constipation., Disp: , Rfl:  .  ibuprofen (ADVIL,MOTRIN) 200 MG tablet, Take 400 mg by mouth every 6 (six) hours as needed for headache or mild pain., Disp: , Rfl:  .   levothyroxine (SYNTHROID, LEVOTHROID) 175 MCG tablet, Take 175 mcg by mouth daily before breakfast., Disp: , Rfl:  .  levothyroxine (SYNTHROID, LEVOTHROID) 200 MCG tablet, TAKE ONE TABLET BY MOUTH DAILY EVERY OTHER DAY (OPPOSITE 175MCG), Disp: 30 tablet, Rfl: 0 .  lisinopril-hydrochlorothiazide (PRINZIDE,ZESTORETIC) 20-25 MG tablet, Take 1 tablet by mouth daily., Disp: 90 tablet, Rfl: 1 .  Multiple Vitamin (MULTIVITAMIN) tablet, Take 1 tablet by mouth 2 (two) times daily. , Disp: , Rfl:  .  nitrofurantoin, macrocrystal-monohydrate, (MACROBID) 100 MG capsule, Take 1 capsule (100 mg total) by mouth 2 (two) times daily., Disp: 10 capsule, Rfl: 0 .  oxybutynin (DITROPAN-XL) 10 MG 24 hr tablet, TAKE 1 TABLET BY MOUTH EVERY DAY (Patient taking differently: Take 10 mg by mouth at bedtime. ), Disp: 90 tablet, Rfl: 1 .  pravastatin (PRAVACHOL) 20 MG tablet, Take 1 tablet (20 mg total) by mouth daily., Disp: 90 tablet, Rfl: 1  Current Facility-Administered Medications:  .  Ring Pessary/Support MISC 1 each, 1 each, Does not apply, Once, Anyanwu, Sallyanne Havers, MD   Allergies  Allergen Reactions  . Codeine Nausea And Vomiting     Review of Systems  Constitutional: Negative for appetite change, chills, diaphoresis, fatigue and fever.  HENT: Negative for trouble swallowing and voice change.        Denies tongue swelling  Respiratory: Positive for choking. Negative for chest tightness and shortness of breath.   Cardiovascular: Negative for chest pain, palpitations and leg swelling.  Gastrointestinal: Negative for nausea.  Genitourinary: Negative for decreased urine volume, dysuria, enuresis, flank pain, frequency, hematuria, pelvic pain and urgency.  Musculoskeletal: Negative for gait problem.  Skin: Positive for rash.       Denies pruritus  Neurological: Negative for dizziness and headaches.       Feels off balance  Psychiatric/Behavioral: Negative for decreased concentration.     Today's Vitals    09/08/18 1000  BP: 128/64  Pulse: 67  Temp: 97.7 F (36.5 C)  TempSrc: Oral  SpO2: 93%  Weight: 147 lb (66.7 kg)  Height: 5\' 2"  (1.575 m)   Body mass index is 26.89 kg/m.   Objective:  Physical Exam Vitals signs reviewed.  Constitutional:      General: She is not in acute distress.    Appearance: She is not toxic-appearing.  HENT:     Head: Normocephalic.     Right Ear: External ear normal.     Left Ear: External ear normal.     Nose: Nose normal.     Mouth/Throat:     Mouth: Mucous membranes are moist.     Pharynx: Oropharynx is clear.  Eyes:     General: No scleral icterus.       Right eye: No discharge.        Left eye: No discharge.     Conjunctiva/sclera: Conjunctivae normal.  Neck:     Musculoskeletal: Neck supple. No neck rigidity.  Cardiovascular:     Pulses: Normal pulses.  Pulmonary:     Effort: Pulmonary effort is normal. No respiratory distress.     Breath sounds: No stridor. No wheezing, rhonchi or rales.  Lymphadenopathy:     Cervical: No cervical adenopathy.  Skin:    General: Skin is warm and dry.     Capillary Refill: Capillary refill takes less than 2 seconds.     Findings: Rash present.     Comments: Has a mildly red circular patch on her R shin of 1x1 cm patches, but does not seem raised, and there are 2 of them, one on top of the other, on R lower chin. Lower legs have confluent petechia rash, but none on thighs, arms or thorax.   Neurological:     Mental Status: She is alert and oriented to person, place, and time.     Motor: No weakness.     Gait: Gait normal.  Psychiatric:        Mood and Affect: Mood normal.        Behavior: Behavior normal.        Thought Content: Thought content normal.    UA- + nitrates, small leuks and blood.  Assessment And Plan:  1. Petechial rash- acute of lower legs only.  - CBC with Diff - CMP14 + Anion Gap  2. Recurrent UTI- acute and asymptomatic. We will re-check her urine next week to check for  clearance.  - POCT Urinalysis Dipstick (81002) - Culture, Urine - cefTRIAXone (ROCEPHIN) injection 500 mg  3. Allergic reaction, initial encounter- acute. Was told to d/c the Norvasc and we will Fu with her in 1 week.   Pt was examined by [name] FNP today as  well.  Chace Klippel RODRIGUEZ-SOUTHWORTH, PA-C

## 2018-09-09 LAB — POCT URINALYSIS DIPSTICK
Bilirubin, UA: NEGATIVE
Glucose, UA: NEGATIVE
Ketones, UA: NEGATIVE
Nitrite, UA: POSITIVE
Protein, UA: POSITIVE — AB
Spec Grav, UA: 1.02 (ref 1.010–1.025)
Urobilinogen, UA: 0.2 E.U./dL
pH, UA: 7 (ref 5.0–8.0)

## 2018-09-10 ENCOUNTER — Other Ambulatory Visit: Payer: Self-pay

## 2018-09-10 LAB — URINE CULTURE

## 2018-09-17 ENCOUNTER — Other Ambulatory Visit: Payer: Self-pay | Admitting: Nurse Practitioner

## 2018-09-17 ENCOUNTER — Other Ambulatory Visit: Payer: Medicare Other

## 2018-09-17 DIAGNOSIS — N390 Urinary tract infection, site not specified: Secondary | ICD-10-CM

## 2018-09-17 NOTE — Progress Notes (Unsigned)
Urine

## 2018-09-19 LAB — URINE CULTURE

## 2018-10-12 ENCOUNTER — Emergency Department (HOSPITAL_COMMUNITY)
Admission: EM | Admit: 2018-10-12 | Discharge: 2018-10-13 | Disposition: A | Discharge status: Home / Self Care | Payer: Medicare Other | Attending: Emergency Medicine | Admitting: Emergency Medicine

## 2018-10-12 ENCOUNTER — Other Ambulatory Visit: Payer: Self-pay

## 2018-10-12 ENCOUNTER — Encounter (HOSPITAL_COMMUNITY): Payer: Self-pay | Admitting: Emergency Medicine

## 2018-10-12 DIAGNOSIS — N183 Chronic kidney disease, stage 3 (moderate): Secondary | ICD-10-CM | POA: Diagnosis not present

## 2018-10-12 DIAGNOSIS — E039 Hypothyroidism, unspecified: Secondary | ICD-10-CM | POA: Insufficient documentation

## 2018-10-12 DIAGNOSIS — Z87891 Personal history of nicotine dependence: Secondary | ICD-10-CM | POA: Diagnosis not present

## 2018-10-12 DIAGNOSIS — R531 Weakness: Secondary | ICD-10-CM | POA: Insufficient documentation

## 2018-10-12 DIAGNOSIS — I129 Hypertensive chronic kidney disease with stage 1 through stage 4 chronic kidney disease, or unspecified chronic kidney disease: Secondary | ICD-10-CM | POA: Diagnosis not present

## 2018-10-12 DIAGNOSIS — E871 Hypo-osmolality and hyponatremia: Secondary | ICD-10-CM

## 2018-10-12 DIAGNOSIS — Z79899 Other long term (current) drug therapy: Secondary | ICD-10-CM | POA: Diagnosis not present

## 2018-10-12 DIAGNOSIS — R3129 Other microscopic hematuria: Secondary | ICD-10-CM | POA: Insufficient documentation

## 2018-10-12 DIAGNOSIS — Z7982 Long term (current) use of aspirin: Secondary | ICD-10-CM | POA: Diagnosis not present

## 2018-10-12 DIAGNOSIS — R001 Bradycardia, unspecified: Secondary | ICD-10-CM | POA: Diagnosis not present

## 2018-10-12 LAB — COMPREHENSIVE METABOLIC PANEL
ALT: 14 U/L (ref 0–44)
AST: 19 U/L (ref 15–41)
Albumin: 3.9 g/dL (ref 3.5–5.0)
Alkaline Phosphatase: 80 U/L (ref 38–126)
Anion gap: 9 (ref 5–15)
BUN: 17 mg/dL (ref 8–23)
CO2: 25 mmol/L (ref 22–32)
Calcium: 8.8 mg/dL — ABNORMAL LOW (ref 8.9–10.3)
Chloride: 93 mmol/L — ABNORMAL LOW (ref 98–111)
Creatinine, Ser: 1.01 mg/dL — ABNORMAL HIGH (ref 0.44–1.00)
GFR calc Af Amer: >60 mL/min (ref >60)
GFR calc non Af Amer: 53 mL/min — ABNORMAL LOW (ref >60)
Glucose, Bld: 130 mg/dL — ABNORMAL HIGH (ref 70–99)
Potassium: 4 mmol/L (ref 3.5–5.1)
Sodium: 127 mmol/L — ABNORMAL LOW (ref 135–145)
Total Bilirubin: 0.5 mg/dL (ref 0.3–1.2)
Total Protein: 6.9 g/dL (ref 6.5–8.1)

## 2018-10-12 LAB — CBC
HCT: 36.7 % (ref 36.0–46.0)
Hemoglobin: 11.9 g/dL — ABNORMAL LOW (ref 12.0–15.0)
MCH: 30 pg (ref 26.0–34.0)
MCHC: 32.4 g/dL (ref 30.0–36.0)
MCV: 92.4 fL (ref 80.0–100.0)
Platelets: 281 10*3/uL (ref 150–400)
RBC: 3.97 MIL/uL (ref 3.87–5.11)
RDW: 12.7 % (ref 11.5–15.5)
WBC: 9.4 10*3/uL (ref 4.0–10.5)
nRBC: 0 % (ref 0.0–0.2)

## 2018-10-12 LAB — URINALYSIS, ROUTINE W REFLEX MICROSCOPIC
Bacteria, UA: NONE SEEN
Bilirubin Urine: NEGATIVE
Glucose, UA: NEGATIVE mg/dL
Ketones, ur: NEGATIVE mg/dL
Nitrite: NEGATIVE
Protein, ur: NEGATIVE mg/dL
Specific Gravity, Urine: 1.002 — ABNORMAL LOW (ref 1.005–1.030)
pH: 6 (ref 5.0–8.0)

## 2018-10-12 LAB — MAGNESIUM: Magnesium: 1.7 mg/dL (ref 1.7–2.4)

## 2018-10-12 LAB — LIPASE, BLOOD: Lipase: 38 U/L (ref 11–51)

## 2018-10-12 MED ORDER — SODIUM CHLORIDE 0.9% FLUSH: 3.0000 mL | Freq: Once | INTRAVENOUS | Status: DC

## 2018-10-12 NOTE — ED Triage Notes (Addendum)
Patient reports urinary frequency and diarrhea since this morning. C/o generalized weakness. Denies vomiting. States she was recently treated for UTI. Denies headache, chest pain, and SOB. A&Ox4. Ambulatory.

## 2018-10-12 NOTE — Discharge Instructions (Signed)
Please see the information and instructions below regarding your visit.  Your diagnoses today include:  1. Generalized weakness   2. Hyponatremia   3. Bradycardia   4. Microscopic hematuria     Tests performed today include: See side panel of your discharge paperwork for testing performed today. Vital signs are listed at the bottom of these instructions.   Medications prescribed:    Take any prescribed medications only as prescribed, and any over the counter medications only as directed on the packaging.  Home care instructions:  Please follow any educational materials contained in this packet.   Follow-up instructions: Please follow-up with your primary care provider in one week for further evaluation of your symptoms if they are not completely improved.   Please follow up with Cardiology as soon as possible.   Return instructions:  Please return to the Emergency Department if you experience worsening symptoms.  Please return the emergency department if you develop any further episodes that you are going to pass out, chest pain, shortness of breath.  Please return if you have any other emergent concerns.  Additional Information:   Your vital signs today were: BP (!) 152/65    Pulse (!) 52    Temp 97.8 F (36.6 C) (Oral)    Resp 12    Ht 5\' 4"  (1.626 m)    Wt 63.5 kg    SpO2 98%    BMI 24.03 kg/m  If your blood pressure (BP) was elevated on multiple readings during this visit above 130 for the top number or above 80 for the bottom number, please have this repeated by your primary care provider within one month. --------------  Thank you for allowing [place] to participate in your care today.
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
## 2018-10-14 LAB — URINE CULTURE: Culture: <10000 — AB

## 2018-10-21 ENCOUNTER — Encounter: Payer: Self-pay | Admitting: Nurse Practitioner

## 2018-10-21 ENCOUNTER — Ambulatory Visit (INDEPENDENT_AMBULATORY_CARE_PROVIDER_SITE_OTHER): Payer: Medicare Other | Admitting: Nurse Practitioner

## 2018-10-21 VITALS — BP 132/80 | HR 62 | Temp 97.4°F | Wt 146.0 lb

## 2018-10-21 DIAGNOSIS — J069 Acute upper respiratory infection, unspecified: Secondary | ICD-10-CM | POA: Diagnosis not present

## 2018-10-21 DIAGNOSIS — N390 Urinary tract infection, site not specified: Secondary | ICD-10-CM

## 2018-10-21 LAB — POCT URINALYSIS DIPSTICK
Bilirubin, UA: NEGATIVE
Glucose, UA: NEGATIVE
Ketones, UA: NEGATIVE
Nitrite, UA: NEGATIVE
Protein, UA: POSITIVE — AB
Spec Grav, UA: 1.02 (ref 1.010–1.025)
Urobilinogen, UA: 0.2 E.U./dL
pH, UA: 6 (ref 5.0–8.0)

## 2018-10-21 MED ORDER — AMOXICILLIN 875 MG PO TABS: 875.0000 mg | ORAL_TABLET | Freq: Two times a day (BID) | ORAL | 0 refills | Status: DC

## 2018-10-21 NOTE — Patient Instructions (Signed)

## 2018-10-21 NOTE — Progress Notes (Signed)
Subjective:     Patient ID: Abigail [name] , female    DOB: Aug 25, 1936 , 83 y.o.   MRN: [id number]   Chief Complaint  Patient presents with  . other    cough congestion and fever. started sat .     HPI  URI   This is a new problem. The current episode started in the past 7 days. The problem has been gradually worsening. The maximum temperature recorded prior to her arrival was 101 - 101.9 F. The fever has been present for 1 to 2 days. Associated symptoms include congestion and coughing. Pertinent negatives include no abdominal pain, headaches or sore throat.     Past Medical History:  Diagnosis Date  . Carotid artery occlusion   . Complication of anesthesia    hard to wake up per pt  . Diverticulitis   . Hypertension   . Hypothyroid      No family history on file.   Current Outpatient Medications:  .  aspirin EC 81 MG tablet, Take 81 mg by mouth daily., Disp: , Rfl:  .  diltiazem (CARDIZEM CD) 180 MG 24 hr capsule, Take 180 mg by mouth daily., Disp: , Rfl: 0 .  docusate sodium (COLACE) 100 MG capsule, Take 200 mg by mouth daily. , Disp: , Rfl:  .  ibuprofen (ADVIL,MOTRIN) 200 MG tablet, Take 400 mg by mouth every 6 (six) hours as needed for headache or mild pain., Disp: , Rfl:  .  levothyroxine (SYNTHROID, LEVOTHROID) 175 MCG tablet, Take 175 mcg by mouth daily before breakfast., Disp: , Rfl:  .  levothyroxine (SYNTHROID, LEVOTHROID) 200 MCG tablet, TAKE ONE TABLET BY MOUTH DAILY EVERY OTHER DAY (OPPOSITE 175MCG) (Patient taking differently: Take 200 mcg by mouth daily before breakfast. ), Disp: 30 tablet, Rfl: 0 .  lisinopril-hydrochlorothiazide (PRINZIDE,ZESTORETIC) 20-25 MG tablet, Take 1 tablet by mouth daily., Disp: 90 tablet, Rfl: 1 .  Multiple Vitamin (MULTIVITAMIN) tablet, Take 1 tablet by mouth 2 (two) times daily. , Disp: , Rfl:  .  oxybutynin (DITROPAN-XL) 10 MG 24 hr tablet, TAKE 1 TABLET BY MOUTH EVERY DAY (Patient taking differently: Take 10 mg by mouth at bedtime.  ), Disp: 90 tablet, Rfl: 1 .  pravastatin (PRAVACHOL) 20 MG tablet, Take 1 tablet (20 mg total) by mouth daily., Disp: 90 tablet, Rfl: 1 .  amLODipine (NORVASC) 2.5 MG tablet, Take 1 tablet (2.5 mg total) by mouth daily. (Patient not taking: Reported on 10/12/2018), Disp: 30 tablet, Rfl: 2 .  nitrofurantoin, macrocrystal-monohydrate, (MACROBID) 100 MG capsule, Take 1 capsule (100 mg total) by mouth 2 (two) times daily. (Patient not taking: Reported on 10/21/2018), Disp: 10 capsule, Rfl: 0  Current Facility-Administered Medications:  .  Ring Pessary/Support MISC 1 each, 1 each, Does not apply, Once, Anyanwu, Sallyanne Havers, MD   Allergies  Allergen Reactions  . Codeine Nausea And Vomiting  . Norvasc [Amlodipine] Rash and Other (See Comments)    Petechia     Review of Systems  Constitutional: Positive for fever. Negative for appetite change and fatigue.  HENT: Positive for congestion. Negative for postnasal drip and sore throat.   Eyes: Negative.   Respiratory: Positive for cough.   Cardiovascular: Negative.   Gastrointestinal: Negative for abdominal pain.  Neurological: Negative for dizziness and headaches.     Today's Vitals   10/21/18 1044  BP: 132/80  Pulse: 62  Temp: (!) 97.4 F (36.3 C)  SpO2: 94%  Weight: 146 lb (66.2 kg)   Body mass  index is 25.06 kg/m.   Objective:  Physical Exam Neck:     Musculoskeletal: Normal range of motion and neck supple. No muscular tenderness.  Cardiovascular:     Rate and Rhythm: Normal rate and regular rhythm.     Pulses: Normal pulses.     Heart sounds: No murmur.  Pulmonary:     Effort: Pulmonary effort is normal. No respiratory distress.     Breath sounds: Normal breath sounds.  Neurological:     General: No focal deficit present.     Mental Status: She is oriented to person, place, and time.         Assessment And Plan:   1. Upper respiratory tract infection, unspecified type  Will treat with amoxicillin due to fever  If not  better return call to office may need a CXR.   2. Recurrent UTI  Will recheck urine today  Moderate leukocytes in urine  She will be taking amoxicillin for her URI so if she has a UTI this will help   Will consider no additional treatment if she is asymptomatic. - POCT Urinalysis Dipstick (63785)   [name], FNP

## 2018-10-25 ENCOUNTER — Ambulatory Visit (INDEPENDENT_AMBULATORY_CARE_PROVIDER_SITE_OTHER): Payer: Medicare Other | Admitting: Interventional Cardiology

## 2018-10-25 ENCOUNTER — Encounter: Payer: Self-pay | Admitting: Interventional Cardiology

## 2018-10-25 VITALS — BP 110/54 | HR 67 | Ht 64.0 in | Wt 146.8 lb

## 2018-10-25 DIAGNOSIS — I10 Essential (primary) hypertension: Secondary | ICD-10-CM

## 2018-10-25 DIAGNOSIS — R55 Syncope and collapse: Secondary | ICD-10-CM | POA: Diagnosis not present

## 2018-10-25 DIAGNOSIS — R011 Cardiac murmur, unspecified: Secondary | ICD-10-CM | POA: Diagnosis not present

## 2018-10-25 NOTE — Patient Instructions (Signed)
Medication Instructions:  Your physician recommends that you continue on your current medications as directed. Please refer to the Current Medication list given to you today.  If you need a refill on your cardiac medications before your next appointment, please call your pharmacy.   Lab work: None Ordered  If you have labs (blood work) drawn today and your tests are completely normal, you will receive your results only by: [name] Kitchen MyChart Message (if you have MyChart) OR . A paper copy in the mail If you have any lab test that is abnormal or we need to change your treatment, we will call you to review the results.  Testing/Procedures: Your physician has requested that you have an echocardiogram. Echocardiography is a painless test that uses sound waves to create images of your heart. It provides your doctor with information about the size and shape of your heart and how well your heart's chambers and valves are working. This procedure takes approximately one hour. There are no restrictions for this procedure.  Your physician has recommended that you wear an event monitor. Event monitors are medical devices that record the heart's electrical activity. Doctors most often [place] these monitors to diagnose arrhythmias. Arrhythmias are problems with the speed or rhythm of the heartbeat. The monitor is a small, portable device. You can wear one while you do your normal daily activities. This is usually used to diagnose what is causing palpitations/syncope (passing out).  Follow-Up: . Based on test results  Any Other Special Instructions Will Be Listed Below (If Applicable).   Echocardiogram An echocardiogram is a procedure that uses painless sound waves (ultrasound) to produce an image of the heart. Images from an echocardiogram can provide important information about:  Signs of coronary artery disease (CAD).  Aneurysm detection. An aneurysm is a weak or damaged part of an artery wall that bulges out  from the normal force of blood pumping through the body.  Heart size and shape. Changes in the size or shape of the heart can be associated with certain conditions, including heart failure, aneurysm, and CAD.  Heart muscle function.  Heart valve function.  Signs of a past heart attack.  Fluid buildup around the heart.  Thickening of the heart muscle.  A tumor or infectious growth around the heart valves. Tell a health care provider about:  Any allergies you have.  All medicines you are taking, including vitamins, herbs, eye drops, creams, and over-the-counter medicines.  Any blood disorders you have.  Any surgeries you have had.  Any medical conditions you have.  Whether you are pregnant or may be pregnant. What are the risks? Generally, this is a safe procedure. However, problems may occur, including:  Allergic reaction to dye (contrast) that may be used during the procedure. What happens before the procedure? No specific preparation is needed. You may eat and drink normally. What happens during the procedure?   An IV tube may be inserted into one of your veins.  You may receive contrast through this tube. A contrast is an injection that improves the quality of the pictures from your heart.  A gel will be applied to your chest.  A wand-like tool (transducer) will be moved over your chest. The gel will help to transmit the sound waves from the transducer.  The sound waves will harmlessly bounce off of your heart to allow the heart images to be captured in real-time motion. The images will be recorded on a computer. The procedure may vary among health care  providers and hospitals. What happens after the procedure?  You may return to your normal, everyday life, including diet, activities, and medicines, unless your health care provider tells you not to do that. Summary  An echocardiogram is a procedure that uses painless sound waves (ultrasound) to produce an image  of the heart.  Images from an echocardiogram can provide important information about the size and shape of your heart, heart muscle function, heart valve function, and fluid buildup around your heart.  You do not need to do anything to prepare before this procedure. You may eat and drink normally.  After the echocardiogram is completed, you may return to your normal, everyday life, unless your health care provider tells you not to do that. This information is not intended to replace advice given to you by your health care provider. Make sure you discuss any questions you have with your health care provider. Document Released: 08/08/2000 Document Revised: 09/13/2016 Document Reviewed: 09/13/2016 Elsevier Interactive Patient Education  2019 [employer].   Ambulatory Cardiac Monitoring An ambulatory cardiac monitor is a small recording device that is used to detect abnormal heart rhythms (arrhythmias). Most monitors are connected by wires to flat, sticky disks (electrodes) that are then attached to your chest. You may need to wear a monitor if you have had symptoms such as:  Fast heartbeats (palpitations).  Dizziness.  Fainting or light-headedness.  Unexplained weakness.  Shortness of breath. There are several types of monitors. Some common monitors include:  Holter monitor. This records your heart rhythm continuously, usually for 24-48 hours.  Event (episodic) monitor. This monitor has a symptoms button, and when pushed, it will begin recording. You need to activate this monitor to record when you have a heart-related symptom.  Automatic detection monitor. This monitor will begin recording when it detects an abnormal heartbeat. What are the risks? Generally, these devices are safe to use. However, it is possible that the skin under the electrodes will become irritated. How to prepare for monitoring Your health care provider will prepare your chest for the electrode placement and  show you how to use the monitor.  Do not apply lotions to your chest before monitoring.  Follow directions on how to care for the monitor, and how to return the monitor when the testing period is complete. How to use your cardiac monitor  Follow directions about how long to wear the monitor, and if you can take the monitor off in order to shower or bathe. ? Do not let the monitor get wet. ? Do not bathe, swim, or use a hot tub while wearing the monitor.  Keep your skin clean. Do not put body lotion or moisturizer on your chest.  Change the electrodes as told by your health care provider, or any time they stop sticking to your skin. You may need to use medical tape to keep them on.  Try to put the electrodes in slightly different places on your chest to help prevent skin irritation. Follow directions from your health care provider about where to place the electrodes.  Make sure the monitor is safely clipped to your clothing or in a location close to your body as recommended by your health care provider.  If your monitor has a symptoms button, press the button to mark an event as soon as you feel a heart-related symptom, such as: ? Dizziness. ? Weakness. ? Light-headedness. ? Palpitations. ? Thumping or pounding in your chest. ? Shortness of breath. ? Unexplained weakness.  Keep  a diary of your activities, such as walking, doing chores, and taking medicine. It is very important to note what you were doing when you pushed the button to record your symptoms. This will help your health care provider determine what might be contributing to your symptoms.  Send the recorded information as recommended by your health care provider. It may take some time for your health care provider to process the results.  Change the batteries as told by your health care provider.  Keep electronic devices away from your monitor. These include: ? Tablets. ? MP3 players. ? Cell phones.  While wearing  your monitor you should avoid: ? Electric blankets. ? Armed forces operational officer. ? Electric toothbrushes. ? Microwave ovens. ? Magnets. ? Metal detectors. Get help right away if:  You have chest pain.  You have shortness of breath or extreme difficulty breathing.  You develop a very fast heartbeat that does not get better.  You develop dizziness that does not go away.  You faint or constantly feel like you are about to faint. Summary  An ambulatory cardiac monitor is a small recording device that is used to detect abnormal heart rhythms (arrhythmias).  Make sure you understand how to send the information from the monitor to your health care provider.  It is important to press the button on the monitor when you have any heart-related symptoms.  Keep a diary of your activities, such as walking, doing chores, and taking medicine. It is very important to note what you were doing when you pushed the button to record your symptoms. This will help your health care provider learn what might be causing your symptoms. This information is not intended to replace advice given to you by your health care provider. Make sure you discuss any questions you have with your health care provider. Document Released: 05/20/2008 Document Revised: 05/27/2017 Document Reviewed: 07/26/2016 Elsevier Interactive Patient Education  2019 [employer].
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
## 2018-10-27 ENCOUNTER — Ambulatory Visit: Payer: Self-pay | Admitting: Nurse Practitioner

## 2018-11-08 ENCOUNTER — Other Ambulatory Visit: Payer: Self-pay | Admitting: Nurse Practitioner

## 2018-11-10 ENCOUNTER — Other Ambulatory Visit: Payer: Self-pay

## 2018-11-10 ENCOUNTER — Telehealth: Payer: Self-pay

## 2018-11-10 MED ORDER — LEVOTHYROXINE SODIUM 175 MCG PO TABS: ORAL_TABLET | ORAL | 2 refills | Status: DC

## 2018-11-10 MED ORDER — LEVOTHYROXINE SODIUM 200 MCG PO TABS: ORAL_TABLET | ORAL | 2 refills | Status: DC

## 2018-11-10 MED ORDER — LISINOPRIL-HYDROCHLOROTHIAZIDE 20-25 MG PO TABS: 1.0000 | ORAL_TABLET | Freq: Every day | ORAL | 1 refills | Status: DC

## 2018-11-10 NOTE — Telephone Encounter (Signed)
I left the pt a message to call back so that I can verify how she is taking her synthroid.

## 2018-11-17 ENCOUNTER — Other Ambulatory Visit (HOSPITAL_COMMUNITY): Payer: Self-pay

## 2018-11-23 ENCOUNTER — Other Ambulatory Visit: Payer: Self-pay | Admitting: Nurse Practitioner

## 2018-12-29 ENCOUNTER — Ambulatory Visit (INDEPENDENT_AMBULATORY_CARE_PROVIDER_SITE_OTHER): Payer: Medicare Other | Admitting: Nurse Practitioner

## 2018-12-29 ENCOUNTER — Other Ambulatory Visit: Payer: Self-pay

## 2018-12-29 ENCOUNTER — Other Ambulatory Visit: Payer: Self-pay | Admitting: Nurse Practitioner

## 2018-12-29 ENCOUNTER — Ambulatory Visit (INDEPENDENT_AMBULATORY_CARE_PROVIDER_SITE_OTHER): Payer: Medicare Other

## 2018-12-29 ENCOUNTER — Encounter: Payer: Self-pay | Admitting: Nurse Practitioner

## 2018-12-29 VITALS — BP 120/80 | HR 56 | Temp 97.7°F | Ht 61.2 in | Wt 146.2 lb

## 2018-12-29 DIAGNOSIS — F419 Anxiety disorder, unspecified: Secondary | ICD-10-CM

## 2018-12-29 DIAGNOSIS — E785 Hyperlipidemia, unspecified: Secondary | ICD-10-CM | POA: Diagnosis not present

## 2018-12-29 DIAGNOSIS — Z0000 Encounter for general adult medical examination without abnormal findings: Secondary | ICD-10-CM

## 2018-12-29 DIAGNOSIS — I10 Essential (primary) hypertension: Secondary | ICD-10-CM | POA: Diagnosis not present

## 2018-12-29 DIAGNOSIS — E039 Hypothyroidism, unspecified: Secondary | ICD-10-CM

## 2018-12-29 DIAGNOSIS — R7303 Prediabetes: Secondary | ICD-10-CM

## 2018-12-29 MED ORDER — BUSPIRONE HCL 5 MG PO TABS: 5.0000 mg | ORAL_TABLET | Freq: Every day | ORAL | 2 refills | Status: DC

## 2018-12-29 NOTE — Patient Instructions (Signed)
[name] , Thank you for taking time to come for your Medicare Wellness Visit. I appreciate your ongoing commitment to your health goals. Please review the following plan we discussed and let me know if I can assist you in the future.   Screening recommendations/referrals: Colonoscopy: not required Mammogram: not required Bone Density: due Recommended yearly ophthalmology/optometry visit for glaucoma screening and checkup Recommended yearly dental visit for hygiene and checkup  Vaccinations: Influenza vaccine: 05/2018 Pneumococcal vaccine: 07/2012 Tdap vaccine: due Shingles vaccine: discussed    Advanced directives: Advance directive discussed with you today. Even though you declined this today please call our office should you change your mind and we can give you the proper paperwork for you to fill out.   Conditions/risks identified: Overweight  Next appointment: 02/09/2019 at 10:15   Preventive Care 81 Years and Older, Female Preventive care refers to lifestyle choices and visits with your health care provider that can promote health and wellness. What does preventive care include?  A yearly physical exam. This is also called an annual well check.  Dental exams once or twice a year.  Routine eye exams. Ask your health care provider how often you should have your eyes checked.  Personal lifestyle choices, including:  Daily care of your teeth and gums.  Regular physical activity.  Eating a healthy diet.  Avoiding tobacco and drug use.  Limiting alcohol use.  Practicing safe sex.  Taking low-dose aspirin every day.  Taking vitamin and mineral supplements as recommended by your health care provider. What happens during an annual well check? The services and screenings done by your health care provider during your annual well check will depend on your age, overall health, lifestyle risk factors, and family history of disease. Counseling  Your health care provider  may ask you questions about your:  Alcohol use.  Tobacco use.  Drug use.  Emotional well-being.  Home and relationship well-being.  Sexual activity.  Eating habits.  History of falls.  Memory and ability to understand (cognition).  Work and work Statistician.  Reproductive health. Screening  You may have the following tests or measurements:  Height, weight, and BMI.  Blood pressure.  Lipid and cholesterol levels. These may be checked every 5 years, or more frequently if you are over 81 years old.  Skin check.  Lung cancer screening. You may have this screening every year starting at age 81 if you have a 30-pack-year history of smoking and currently smoke or have quit within the past 15 years.  Fecal occult blood test (FOBT) of the stool. You may have this test every year starting at age 81.  Flexible sigmoidoscopy or colonoscopy. You may have a sigmoidoscopy every 5 years or a colonoscopy every 10 years starting at age 81.  Hepatitis C blood test.  Hepatitis B blood test.  Sexually transmitted disease (STD) testing.  Diabetes screening. This is done by checking your blood sugar (glucose) after you have not eaten for a while (fasting). You may have this done every 1-3 years.  Bone density scan. This is done to screen for osteoporosis. You may have this done starting at age 81.  Mammogram. This may be done every 1-2 years. Talk to your health care provider about how often you should have regular mammograms. Talk with your health care provider about your test results, treatment options, and if necessary, the need for more tests. Vaccines  Your health care provider may recommend certain vaccines, such as:  Influenza vaccine. This is  recommended every year.  Tetanus, diphtheria, and acellular pertussis (Tdap, Td) vaccine. You may need a Td booster every 10 years.  Zoster vaccine. You may need this after age 74.  Pneumococcal 13-valent conjugate (PCV13) vaccine.  One dose is recommended after age 81.  Pneumococcal polysaccharide (PPSV23) vaccine. One dose is recommended after age 81. Talk to your health care provider about which screenings and vaccines you need and how often you need them. This information is not intended to replace advice given to you by your health care provider. Make sure you discuss any questions you have with your health care provider. Document Released: 09/07/2015 Document Revised: 04/30/2016 Document Reviewed: 06/12/2015 Elsevier Interactive Patient Education  2017 [place] Prevention in the Home Falls can cause injuries. They can happen to people of all ages. There are many things you can do to make your home safe and to help prevent falls. What can I do on the outside of my home?  Regularly fix the edges of walkways and driveways and fix any cracks.  Remove anything that might make you trip as you walk through a door, such as a raised step or threshold.  Trim any bushes or trees on the path to your home.  Use bright outdoor lighting.  Clear any walking paths of anything that might make someone trip, such as rocks or tools.  Regularly check to see if handrails are loose or broken. Make sure that both sides of any steps have handrails.  Any raised decks and porches should have guardrails on the edges.  Have any leaves, snow, or ice cleared regularly.  Use sand or salt on walking paths during winter.  Clean up any spills in your garage right away. This includes oil or grease spills. What can I do in the bathroom?  Use night lights.  Install grab bars by the toilet and in the tub and shower. Do not use towel bars as grab bars.  Use non-skid mats or decals in the tub or shower.  If you need to sit down in the shower, use a plastic, non-slip stool.  Keep the floor dry. Clean up any water that spills on the floor as soon as it happens.  Remove soap buildup in the tub or shower regularly.  Attach bath  mats securely with double-sided non-slip rug tape.  Do not have throw rugs and other things on the floor that can make you trip. What can I do in the bedroom?  Use night lights.  Make sure that you have a light by your bed that is easy to reach.  Do not use any sheets or blankets that are too big for your bed. They should not hang down onto the floor.  Have a firm chair that has side arms. You can use this for support while you get dressed.  Do not have throw rugs and other things on the floor that can make you trip. What can I do in the kitchen?  Clean up any spills right away.  Avoid walking on wet floors.  Keep items that you use a lot in easy-to-reach places.  If you need to reach something above you, use a strong step stool that has a grab bar.  Keep electrical cords out of the way.  Do not use floor polish or wax that makes floors slippery. If you must use wax, use non-skid floor wax.  Do not have throw rugs and other things on the floor that can make you trip.  What can I do with my stairs?  Do not leave any items on the stairs.  Make sure that there are handrails on both sides of the stairs and use them. Fix handrails that are broken or loose. Make sure that handrails are as long as the stairways.  Check any carpeting to make sure that it is firmly attached to the stairs. Fix any carpet that is loose or worn.  Avoid having throw rugs at the top or bottom of the stairs. If you do have throw rugs, attach them to the floor with carpet tape.  Make sure that you have a light switch at the top of the stairs and the bottom of the stairs. If you do not have them, ask someone to add them for you. What else can I do to help prevent falls?  Wear shoes that:  Do not have high heels.  Have rubber bottoms.  Are comfortable and fit you well.  Are closed at the toe. Do not wear sandals.  If you use a stepladder:  Make sure that it is fully opened. Do not climb a closed  stepladder.  Make sure that both sides of the stepladder are locked into place.  Ask someone to hold it for you, if possible.  Clearly mark and make sure that you can see:  Any grab bars or handrails.  First and last steps.  Where the edge of each step is.  Use tools that help you move around (mobility aids) if they are needed. These include:  Canes.  Walkers.  Scooters.  Crutches.  Turn on the lights when you go into a dark area. Replace any light bulbs as soon as they burn out.  Set up your furniture so you have a clear path. Avoid moving your furniture around.  If any of your floors are uneven, fix them.  If there are any pets around you, be aware of where they are.  Review your medicines with your doctor. Some medicines can make you feel dizzy. This can increase your chance of falling. Ask your doctor what other things that you can do to help prevent falls. This information is not intended to replace advice given to you by your health care provider. Make sure you discuss any questions you have with your health care provider. Document Released: 06/07/2009 Document Revised: 01/17/2016 Document Reviewed: 09/15/2014 Elsevier Interactive Patient Education  2017 [employer].

## 2018-12-29 NOTE — Progress Notes (Signed)
Subjective:   Abigail [name] is a 83 y.o. female who presents for Medicare Annual (Subsequent) preventive examination.  Review of Systems:  n/a Cardiac Risk Factors include: advanced age (>52men, >65 women);dyslipidemia;hypertension;sedentary lifestyle     Objective:     Vitals: BP 120/80 (Patient Position: Sitting)   Pulse (!) 56   Temp 97.7 F (36.5 C) (Oral)   Ht 5' 1.2" (1.554 m)   Wt 146 lb 3.2 oz (66.3 kg)   BMI 27.44 kg/m   Body mass index is 27.44 kg/m.  Advanced Directives 12/29/2018 10/12/2018 05/13/2015 04/05/2012  Does Patient Have a Medical Advance Directive? No No No Patient does not have advance directive;Patient would not like information  Would patient like information on creating a medical advance directive? - - No - patient declined information -    Tobacco Social History   Tobacco Use  Smoking Status Former Smoker  . Last attempt to quit: 04/05/1986  . Years since quitting: 32.7  Smokeless Tobacco Never Used     Counseling given: Not Answered   Clinical Intake:  Pre-visit preparation completed: Yes  Pain : No/denies pain     Nutritional Status: BMI 25 -29 Overweight Nutritional Risks: None Diabetes: No  How often do you need to have someone help you when you read instructions, pamphlets, or other written materials from your doctor or pharmacy?: 1 - Never What is the last grade level you completed in school?: some college  Interpreter Needed?: No  Information entered by :: NAllen LPN  Past Medical History:  Diagnosis Date  . Carotid artery occlusion   . Complication of anesthesia    hard to wake up per pt  . Diverticulitis   . Hypertension   . Hypothyroid    Past Surgical History:  Procedure Laterality Date  . BACK SURGERY  1980  . ELBOW SURGERY     left  . FRACTURE SURGERY    . SPINE SURGERY     Family History  Problem Relation Age of Onset  . Hypertension Mother    Social History   Socioeconomic History  . Marital  status: Single    Spouse name: Not on file  . Number of children: Not on file  . Years of education: Not on file  . Highest education level: Not on file  Occupational History  . Not on file  Social Needs  . Financial resource strain: Not hard at all  . Food insecurity:    Worry: Never true    Inability: Never true  . Transportation needs:    Medical: No    Non-medical: No  Tobacco Use  . Smoking status: Former Smoker    Last attempt to quit: 04/05/1986    Years since quitting: 32.7  . Smokeless tobacco: Never Used  Substance and Sexual Activity  . Alcohol use: No  . Drug use: No  . Sexual activity: Not Currently    Birth control/protection: Post-menopausal  Lifestyle  . Physical activity:    Days per week: 0 days    Minutes per session: 0 min  . Stress: Not at all  Relationships  . Social connections:    Talks on phone: Not on file    Gets together: Not on file    Attends religious service: Not on file    Active member of club or organization: Not on file    Attends meetings of clubs or organizations: Not on file    Relationship status: Not on file  Other Topics Concern  .  Not on file  Social History Narrative  . Not on file    Outpatient Encounter Medications as of 12/29/2018  Medication Sig  . aspirin EC 81 MG tablet Take 81 mg by mouth daily.  . Cranberry 1000 MG CAPS Take 1 capsule by mouth daily at 12 noon.  . diltiazem (CARDIZEM CD) 180 MG 24 hr capsule TAKE 1 CAPSULE BY MOUTH EVERY DAY  . docusate sodium (COLACE) 100 MG capsule Take 200 mg by mouth daily.   [name] Kitchen ibuprofen (ADVIL,MOTRIN) 200 MG tablet Take 400 mg by mouth every 6 (six) hours as needed for headache or mild pain.  [name] Kitchen levothyroxine (SYNTHROID, LEVOTHROID) 175 MCG tablet Take 1 tablet by mouth every other day opposite of the 200 mcg  . levothyroxine (SYNTHROID, LEVOTHROID) 200 MCG tablet TAKE ONE TABLET BY MOUTH DAILY EVERY OTHER DAY (OPPOSITE 175MCG)  . lisinopril-hydrochlorothiazide  (PRINZIDE,ZESTORETIC) 20-25 MG tablet Take 1 tablet by mouth daily.  [name] Kitchen oxybutynin (DITROPAN-XL) 10 MG 24 hr tablet TAKE 1 TABLET BY MOUTH EVERY DAY (Patient taking differently: Take 10 mg by mouth at bedtime. )  . pravastatin (PRAVACHOL) 20 MG tablet Take 1 tablet (20 mg total) by mouth daily.  . Probiotic Product (PROBIOTIC-10 PO) Take 1 capsule by mouth daily at 12 noon.   Facility-Administered Encounter Medications as of 12/29/2018  Medication  . Ring Pessary/Support MISC 1 each    Activities of Daily Living In your present state of health, do you have any difficulty performing the following activities: 12/29/2018  Hearing? N  Vision? Y  Comment needs to see an eye doctor  Difficulty concentrating or making decisions? N  Walking or climbing stairs? N  Dressing or bathing? N  Doing errands, shopping? N  Preparing Food and eating ? N  Using the Toilet? N  In the past six months, have you accidently leaked urine? Y  Do you have problems with loss of bowel control? N  Managing your Medications? N  Managing your Finances? N  Housekeeping or managing your Housekeeping? N  Some recent data might be hidden    Patient Care Team: [name], FNP as PCP - General (General Practice)    Assessment:   This is a routine wellness examination for [facility].  Exercise Activities and Dietary recommendations Current Exercise Habits: The patient does not participate in regular exercise at present  Goals    . Patient Stated     Wants to have good blood pressure       Fall Risk Fall Risk  12/29/2018 12/29/2018 09/08/2018 08/27/2018 07/12/2018  Falls in the past year? 0 0 0 0 0  Comment - - - - Emmi Telephone Survey: data to providers prior to load  Risk for fall due to : Medication side effect - - - -  Follow up Falls prevention discussed;Education provided - - - -   Is the patient's home free of loose throw rugs in walkways, pet beds, electrical cords, etc?   yes      Grab bars in the bathroom?  yes      Handrails on the stairs?   n/a      Adequate lighting?   yes  Timed Get Up and Go performed: n/a  Depression Screen PHQ 2/9 Scores 12/29/2018 12/29/2018 09/08/2018 08/27/2018  PHQ - 2 Score 0 0 0 0  PHQ- 9 Score 0 - - -     Cognitive Function     6CIT Screen 12/29/2018  What Year? 0 points  What month? 0  points  What time? 3 points  Count back from 20 0 points  Months in reverse 0 points  Repeat phrase 0 points  Total Score 3    Immunization History  Administered Date(s) Administered  . Influenza-Unspecified 05/14/2015, 04/25/2017, 06/19/2018  . Pneumococcal-Unspecified 08/24/2012  . Tdap 02/17/2008    Qualifies for Shingles Vaccine? yes  Screening Tests Health Maintenance  Topic Date Due  . PNA vac Low Risk Adult (2 of 2 - PCV13) 08/24/2013  . TETANUS/TDAP  02/16/2018  . INFLUENZA VACCINE  03/26/2019  . DEXA SCAN  Completed    Cancer Screenings: Lung: Low Dose CT Chest recommended if Age 56-80 years, 30 pack-year currently smoking OR have quit w/in 15years. Patient does not qualify. Breast:  Up to date on Mammogram? Yes   Up to date of Bone Density/Dexa? No Colorectal: not required  Additional Screenings: : Hepatitis C Screening: n/a     Plan:    Wants to have a good blood pressure.   I have personally reviewed and noted the following in the patient's chart:   . Medical and social history . Use of alcohol, tobacco or illicit drugs  . Current medications and supplements . Functional ability and status . Nutritional status . Physical activity . Advanced directives . List of other physicians . Hospitalizations, surgeries, and ER visits in previous 12 months . Vitals . Screenings to include cognitive, depression, and falls . Referrals and appointments  In addition, I have reviewed and discussed with patient certain preventive protocols, quality metrics, and best practice recommendations. A written personalized care plan for preventive services as  well as general preventive health recommendations were provided to patient.     Kellie Simmering, LPN  01/27/353

## 2018-12-29 NOTE — Progress Notes (Signed)
Subjective:     Patient ID: Abigail [name] , female    DOB: May 02, 1936 , 83 y.o.   MRN: [id number]   Chief Complaint  Patient presents with  . Hypertension  . Hypothyroidism    HPI  Hypertension  This is a chronic problem. The current episode started more than 1 year ago. The problem is unchanged. The problem is controlled. Pertinent negatives include no anxiety, blurred vision, chest pain, headaches or palpitations. There are no associated agents to hypertension. There are no known risk factors for coronary artery disease. Past treatments include calcium channel blockers. The current treatment provides moderate improvement. There are no compliance problems.  There is no history of angina. Identifiable causes of hypertension include a thyroid problem. There is no history of chronic renal disease.  Thyroid Problem  Presents for follow-up visit. Patient reports no anxiety, fatigue or palpitations. The symptoms have been stable.     Past Medical History:  Diagnosis Date  . Carotid artery occlusion   . Complication of anesthesia    hard to wake up per pt  . Diverticulitis   . Hypertension   . Hypothyroid      Family History  Problem Relation Age of Onset  . Hypertension Mother      Current Outpatient Medications:  .  aspirin EC 81 MG tablet, Take 81 mg by mouth daily., Disp: , Rfl:  .  Cranberry 1000 MG CAPS, Take 1 capsule by mouth daily at 12 noon., Disp: , Rfl:  .  diltiazem (CARDIZEM CD) 180 MG 24 hr capsule, TAKE 1 CAPSULE BY MOUTH EVERY DAY, Disp: 90 capsule, Rfl: 0 .  docusate sodium (COLACE) 100 MG capsule, Take 200 mg by mouth daily. , Disp: , Rfl:  .  ibuprofen (ADVIL,MOTRIN) 200 MG tablet, Take 400 mg by mouth every 6 (six) hours as needed for headache or mild pain., Disp: , Rfl:  .  levothyroxine (SYNTHROID, LEVOTHROID) 175 MCG tablet, Take 1 tablet by mouth every other day opposite of the 200 mcg, Disp: 30 tablet, Rfl: 2 .  levothyroxine (SYNTHROID, LEVOTHROID) 200  MCG tablet, TAKE ONE TABLET BY MOUTH DAILY EVERY OTHER DAY (OPPOSITE 175MCG), Disp: 30 tablet, Rfl: 2 .  lisinopril-hydrochlorothiazide (PRINZIDE,ZESTORETIC) 20-25 MG tablet, Take 1 tablet by mouth daily., Disp: 90 tablet, Rfl: 1 .  oxybutynin (DITROPAN-XL) 10 MG 24 hr tablet, TAKE 1 TABLET BY MOUTH EVERY DAY (Patient taking differently: Take 10 mg by mouth at bedtime. ), Disp: 90 tablet, Rfl: 1 .  pravastatin (PRAVACHOL) 20 MG tablet, Take 1 tablet (20 mg total) by mouth daily., Disp: 90 tablet, Rfl: 1 .  Probiotic Product (PROBIOTIC-10 PO), Take 1 capsule by mouth daily at 12 noon., Disp: , Rfl:   Current Facility-Administered Medications:  .  Ring Pessary/Support MISC 1 each, 1 each, Does not apply, Once, Anyanwu, Sallyanne Havers, MD   Allergies  Allergen Reactions  . Codeine Nausea And Vomiting  . Norvasc [Amlodipine] Rash and Other (See Comments)    Petechia     Review of Systems  Constitutional: Negative for chills and fatigue.  Eyes: Negative for blurred vision.  Respiratory: Negative.   Cardiovascular: Negative.  Negative for chest pain, palpitations and leg swelling.  Endocrine: Negative for polydipsia, polyphagia and polyuria.  Neurological: Negative for dizziness and headaches.  Psychiatric/Behavioral: Negative for agitation. The patient is not nervous/anxious.       Today's Vitals   12/29/18 1015  BP: 120/80  Pulse: (!) 56  Temp: 97.7 F (36.5  C)  TempSrc: Oral  Weight: 146 lb 3.2 oz (66.3 kg)  Height: 5' 1.2" (1.554 m)  PainSc: 0-No pain   Body mass index is 27.44 kg/m.   Objective:  Physical Exam Constitutional:      Appearance: Normal appearance.  Cardiovascular:     Rate and Rhythm: Normal rate and regular rhythm.     Pulses: Normal pulses.     Heart sounds: Normal heart sounds. No murmur.  Pulmonary:     Effort: Pulmonary effort is normal.     Breath sounds: Normal breath sounds.  Skin:    General: Skin is warm and dry.     Capillary Refill: Capillary  refill takes less than 2 seconds.  Neurological:     General: No focal deficit present.     Mental Status: She is alert and oriented to person, place, and time.  Psychiatric:        Mood and Affect: Mood normal.        Behavior: Behavior normal.        Thought Content: Thought content normal.        Judgment: Judgment normal.         Assessment And Plan:     1. Essential hypertension . B/P is controlled.  . CMP ordered to check renal function.  . The importance of regular exercise and dietary modification was stressed to the patient.  . Stressed importance of losing ten percent of her body weight to help with B/P control.  . The weight loss would help with decreasing cardiac and cancer risk as well.   2. Acquired hypothyroidism  Chronic, controlled  Continue with current medications  3. Hyperlipidemia, unspecified hyperlipidemia type  Chronic, controlled  Continue with current medications  4. Prediabetes  Chronic, stable  Continue with current medications  Encouraged to limit intake of sugary foods and drinks  Encouraged to increase physical activity to 150 minutes per week      [name], FNP    THE PATIENT IS ENCOURAGED TO PRACTICE SOCIAL DISTANCING DUE TO THE COVID-19 PANDEMIC.

## 2018-12-30 LAB — BMP8+EGFR
BUN/Creatinine Ratio: 21 (ref 12–28)
BUN: 23 mg/dL (ref 8–27)
CO2: 22 mmol/L (ref 20–29)
Calcium: 9.3 mg/dL (ref 8.7–10.3)
Chloride: 95 mmol/L — ABNORMAL LOW (ref 96–106)
Creatinine, Ser: 1.1 mg/dL — ABNORMAL HIGH (ref 0.57–1.00)
GFR calc Af Amer: 55 mL/min/{1.73_m2} — ABNORMAL LOW (ref >59)
GFR calc non Af Amer: 48 mL/min/{1.73_m2} — ABNORMAL LOW (ref >59)
Glucose: 85 mg/dL (ref 65–99)
Potassium: 4.3 mmol/L (ref 3.5–5.2)
Sodium: 131 mmol/L — ABNORMAL LOW (ref 134–144)

## 2018-12-30 LAB — CBC
Hematocrit: 36.5 % (ref 34.0–46.6)
Hemoglobin: 12.5 g/dL (ref 11.1–15.9)
MCH: 30.9 pg (ref 26.6–33.0)
MCHC: 34.2 g/dL (ref 31.5–35.7)
MCV: 90 fL (ref 79–97)
Platelets: 317 10*3/uL (ref 150–450)
RBC: 4.04 x10E6/uL (ref 3.77–5.28)
RDW: 12.1 % (ref 11.7–15.4)
WBC: 7.4 10*3/uL (ref 3.4–10.8)

## 2018-12-30 LAB — T3: T3, Total: 82 ng/dL (ref 71–180)

## 2018-12-30 LAB — TSH: TSH: 0.418 u[IU]/mL — ABNORMAL LOW (ref 0.450–4.500)

## 2018-12-30 LAB — T4, FREE: Free T4: 1.55 ng/dL (ref 0.82–1.77)

## 2019-01-07 ENCOUNTER — Telehealth: Payer: Self-pay | Admitting: Radiology

## 2019-01-07 ENCOUNTER — Telehealth (HOSPITAL_COMMUNITY): Payer: Self-pay | Admitting: Radiology

## 2019-01-07 NOTE — Telephone Encounter (Signed)
Left message giving instructions for echocardiogram but asked for a call back to perform COVID prescreening.

## 2019-01-07 NOTE — Telephone Encounter (Signed)
COVID-19 Pre-Screening Questions:  . Do you currently have a fever? YES-102 (yes = cancel and refer to pcp for e-visit) . Have you recently travelled on a cruise, internationally, or to [place], [place], [place], [place], [place], or [place], [place] [employer]) ?NO (yes = cancel, stay home, monitor symptoms, and contact pcp or initiate e-visit if symptoms develop) . Have you been in contact with someone that is currently pending confirmation of Covid19 testing or has been confirmed to have the [place] virus? NO (yes = cancel, stay home, away from tested individual, monitor symptoms, and contact pcp or initiate e-visit if symptoms develop) . Are you currently experiencing fatigue or cough? NO (yes = pt should be prepared to have a mask placed at the time of their visit). . Reiterated no additional visitors. Eartha Inch no earlier than 15 minutes before appointment time. . Please bring own mask.  Spoke with DOD, Dr. Burt Knack regarding fever. Echocardiogram cancelled until patient is symptom free. Patient was also encouraged to see her PCP.

## 2019-01-07 NOTE — Telephone Encounter (Signed)
Enrolled patient for a 30 day Preventcie event monitor to be mailed due to covid-19. Brief instructions were gone over with the patient and she knows to expect the monitor to arrive in 3-4 days

## 2019-01-10 ENCOUNTER — Other Ambulatory Visit (HOSPITAL_COMMUNITY): Payer: Medicare Other

## 2019-01-12 ENCOUNTER — Ambulatory Visit (INDEPENDENT_AMBULATORY_CARE_PROVIDER_SITE_OTHER): Payer: Medicare Other

## 2019-01-12 DIAGNOSIS — R55 Syncope and collapse: Secondary | ICD-10-CM

## 2019-01-17 ENCOUNTER — Encounter: Payer: Self-pay | Admitting: Nurse Practitioner

## 2019-01-21 ENCOUNTER — Other Ambulatory Visit: Payer: Self-pay | Admitting: Nurse Practitioner

## 2019-01-21 DIAGNOSIS — F419 Anxiety disorder, unspecified: Secondary | ICD-10-CM

## 2019-01-24 ENCOUNTER — Telehealth: Payer: Self-pay

## 2019-01-24 NOTE — Telephone Encounter (Signed)
Patient returned my call regarding her labs also refill has been sent to the pharmacy [facility]

## 2019-02-09 ENCOUNTER — Ambulatory Visit (INDEPENDENT_AMBULATORY_CARE_PROVIDER_SITE_OTHER): Payer: Medicare Other | Admitting: Nurse Practitioner

## 2019-02-09 ENCOUNTER — Encounter: Payer: Self-pay | Admitting: Nurse Practitioner

## 2019-02-09 ENCOUNTER — Other Ambulatory Visit: Payer: Self-pay

## 2019-02-09 VITALS — BP 180/62 | HR 71 | Temp 98.2°F | Ht 60.8 in | Wt 150.4 lb

## 2019-02-09 DIAGNOSIS — I10 Essential (primary) hypertension: Secondary | ICD-10-CM | POA: Diagnosis not present

## 2019-02-09 DIAGNOSIS — E039 Hypothyroidism, unspecified: Secondary | ICD-10-CM | POA: Diagnosis not present

## 2019-02-09 DIAGNOSIS — F419 Anxiety disorder, unspecified: Secondary | ICD-10-CM | POA: Diagnosis not present

## 2019-02-09 NOTE — Progress Notes (Signed)
Subjective:     Patient ID: Abigail [name] , female    DOB: Nov 14, 1935 , 83 y.o.   MRN: [id number]   Chief Complaint  Patient presents with  . MED CHECK    HPI  She does not wake up until 10am.  She feels her buspirone makes her lightheaded so will take in the evening.    She is currently wearing a 30 day heart monitor.    Hypertension This is a chronic problem. The current episode started more than 1 year ago. The problem has been gradually worsening since onset. The problem is uncontrolled. Pertinent negatives include no anxiety, chest pain, headaches or palpitations. There are no associated agents to hypertension. Risk factors for coronary artery disease include sedentary lifestyle. Past treatments include ACE inhibitors and diuretics. The current treatment provides no improvement. There are no compliance problems.  There is no history of angina. There is no history of chronic renal disease.     Past Medical History:  Diagnosis Date  . Carotid artery occlusion   . Complication of anesthesia    hard to wake up per pt  . Diverticulitis   . Hypertension   . Hypothyroid      Family History  Problem Relation Age of Onset  . Hypertension Mother      Current Outpatient Medications:  .  aspirin EC 81 MG tablet, Take 81 mg by mouth daily., Disp: , Rfl:  .  busPIRone (BUSPAR) 5 MG tablet, TAKE 1 TABLET BY MOUTH EVERY DAY, Disp: 90 tablet, Rfl: 0 .  Cranberry 1000 MG CAPS, Take 1 capsule by mouth daily at 12 noon., Disp: , Rfl:  .  diltiazem (CARDIZEM CD) 180 MG 24 hr capsule, TAKE 1 CAPSULE BY MOUTH EVERY DAY, Disp: 90 capsule, Rfl: 0 .  docusate sodium (COLACE) 100 MG capsule, Take 200 mg by mouth daily. , Disp: , Rfl:  .  ibuprofen (ADVIL,MOTRIN) 200 MG tablet, Take 400 mg by mouth every 6 (six) hours as needed for headache or mild pain., Disp: , Rfl:  .  levothyroxine (SYNTHROID, LEVOTHROID) 175 MCG tablet, Take 1 tablet by mouth every other day opposite of the 200 mcg, Disp: 30  tablet, Rfl: 2 .  levothyroxine (SYNTHROID, LEVOTHROID) 200 MCG tablet, TAKE ONE TABLET BY MOUTH DAILY EVERY OTHER DAY (OPPOSITE 175MCG), Disp: 30 tablet, Rfl: 2 .  lisinopril-hydrochlorothiazide (PRINZIDE,ZESTORETIC) 20-25 MG tablet, Take 1 tablet by mouth daily., Disp: 90 tablet, Rfl: 1 .  oxybutynin (DITROPAN-XL) 10 MG 24 hr tablet, TAKE 1 TABLET BY MOUTH EVERY DAY (Patient taking differently: Take 10 mg by mouth at bedtime. ), Disp: 90 tablet, Rfl: 1 .  pravastatin (PRAVACHOL) 20 MG tablet, Take 1 tablet (20 mg total) by mouth daily., Disp: 90 tablet, Rfl: 1  Current Facility-Administered Medications:  .  Ring Pessary/Support MISC 1 each, 1 each, Does not apply, Once, Anyanwu, Sallyanne Havers, MD   Allergies  Allergen Reactions  . Codeine Nausea And Vomiting  . Norvasc [Amlodipine] Rash and Other (See Comments)    Petechia     Review of Systems  Constitutional: Negative.   Respiratory: Negative.   Cardiovascular: Negative.  Negative for chest pain, palpitations and leg swelling.  Neurological: Negative for dizziness and headaches.     Today's Vitals   02/09/19 1012  BP: (!) 180/62  Pulse: 71  Temp: 98.2 F (36.8 C)  TempSrc: Oral  Weight: 150 lb 6.4 oz (68.2 kg)  Height: 5' 0.8" (1.544 m)  PainSc: 0-No pain  Body mass index is 28.61 kg/m.   Objective:  Physical Exam Constitutional:      Appearance: Normal appearance.  Cardiovascular:     Rate and Rhythm: Normal rate and regular rhythm.  Musculoskeletal: Normal range of motion.  Skin:    General: Skin is warm and dry.     Capillary Refill: Capillary refill takes less than 2 seconds.  Neurological:     General: No focal deficit present.     Mental Status: She is alert and oriented to person, place, and time.  Psychiatric:        Mood and Affect: Mood normal.        Behavior: Behavior normal.        Thought Content: Thought content normal.        Judgment: Judgment normal.         Assessment And Plan:     1.  Acquired hypothyroidism  Chronic,  Will recheck levels due to low TSH at last visit  She is taking her levothyroxine in the am alone  - T3 - T4, Free - TSH  2. Anxiety  Feels the buspirone is helpful however makes her "lightheaded" so she is taking in the evening  Continue at current dose.  3. Essential hypertension  Chronic  Elevated today she has not taken her medication at this time, takes at lunch  I have advised her to take her blood pressure after taking her medication and we will call her tomorrow for the readings.  Also advised to avoid eating the salted pretzels   [name], FNP    THE PATIENT IS ENCOURAGED TO PRACTICE SOCIAL DISTANCING DUE TO THE COVID-19 PANDEMIC.

## 2019-02-10 LAB — T4, FREE: Free T4: 1.6 ng/dL (ref 0.82–1.77)

## 2019-02-10 LAB — TSH: TSH: 0.627 u[IU]/mL (ref 0.450–4.500)

## 2019-02-10 LAB — T3: T3, Total: 85 ng/dL (ref 71–180)

## 2019-02-16 ENCOUNTER — Other Ambulatory Visit: Payer: Self-pay

## 2019-02-17 ENCOUNTER — Other Ambulatory Visit: Payer: Self-pay | Admitting: Nurse Practitioner

## 2019-02-17 DIAGNOSIS — N3281 Overactive bladder: Secondary | ICD-10-CM

## 2019-03-22 ENCOUNTER — Other Ambulatory Visit: Payer: Self-pay | Admitting: Nurse Practitioner

## 2019-04-20 ENCOUNTER — Other Ambulatory Visit: Payer: Self-pay | Admitting: Nurse Practitioner

## 2019-04-20 DIAGNOSIS — F419 Anxiety disorder, unspecified: Secondary | ICD-10-CM

## 2019-05-11 ENCOUNTER — Telehealth: Payer: Self-pay | Admitting: Nurse Practitioner

## 2019-05-11 NOTE — Chronic Care Management (AMB) (Signed)
°  Chronic Care Management   Outreach Note  05/11/2019 Name: Abigail [name] MRN: [id number] DOB: 01/01/1936  Referred by: [name], FNP Reason for referral : Chronic Care Management (Initial CCM outreach was uunsuccessful. )   An unsuccessful telephone outreach was attempted today. The patient was referred to the case management team by for assistance with chronic care management and care coordination.   Follow Up Plan: The care management team will reach out to the patient again over the next 7 days.   [place]  [email or web address]   ??[phone]

## 2019-05-12 ENCOUNTER — Ambulatory Visit: Payer: Medicare Other | Admitting: Nurse Practitioner

## 2019-05-15 ENCOUNTER — Other Ambulatory Visit: Payer: Self-pay | Admitting: Nurse Practitioner

## 2019-05-15 DIAGNOSIS — N3281 Overactive bladder: Secondary | ICD-10-CM

## 2019-05-17 NOTE — Chronic Care Management (AMB) (Signed)
°  Chronic Care Management   Outreach Note  05/17/2019 Name: Abigail [name] MRN: [id number] DOB: 05-15-1936  Referred by: [name], FNP Reason for referral : Chronic Care Management (Initial CCM outreach was uunsuccessful. ) and Chronic Care Management (Second CCM outreach was unsuccessful. )   A second unsuccessful telephone outreach was attempted today. The patient was referred to the case management team for assistance with chronic care management and care coordination.   Follow Up Plan: The care management team will reach out to the patient again over the next 7 days.   [place]  [email or web address]   ??[phone]

## 2019-05-18 ENCOUNTER — Other Ambulatory Visit: Payer: Self-pay | Admitting: Nurse Practitioner

## 2019-05-23 NOTE — Chronic Care Management (AMB) (Signed)
Chronic Care Management   Note  05/23/2019 Name: Abigail [name] MRN: [id number] DOB: June 17, 1936  Abigail [name] is a 83 y.o. year old female who is a primary care patient of [name], [place]. I reached out to [name] by phone today in response to a referral sent by Abigail [name]'s patient's health plan.     [name] was given information about Chronic Care Management services today including:  1. CCM service includes personalized support from designated clinical staff supervised by her physician, including individualized plan of care and coordination with other care providers 2. 24/7 contact phone numbers for assistance for urgent and routine care needs. 3. Service will only be billed when office clinical staff spend 20 minutes or more in a month to coordinate care. 4. Only one practitioner may furnish and bill the service in a calendar month. 5. The patient may stop CCM services at any time (effective at the end of the month) by phone call to the office staff. 6. The patient will be responsible for cost sharing (co-pay) of up to 20% of the service fee (after annual deductible is met).  Patient agreed to services and verbal consent obtained.   Follow up plan: Telephone appointment with CCM team member scheduled for: 06/22/2019  [place]  [email or web address]   ??[phone]

## 2019-06-16 ENCOUNTER — Other Ambulatory Visit: Payer: Self-pay | Admitting: Nurse Practitioner

## 2019-06-22 ENCOUNTER — Telehealth: Payer: Medicare Other

## 2019-06-28 DIAGNOSIS — Z23 Encounter for immunization: Secondary | ICD-10-CM | POA: Diagnosis not present

## 2019-06-30 ENCOUNTER — Other Ambulatory Visit: Payer: Self-pay

## 2019-06-30 ENCOUNTER — Encounter: Payer: Self-pay | Admitting: Nurse Practitioner

## 2019-06-30 ENCOUNTER — Ambulatory Visit (INDEPENDENT_AMBULATORY_CARE_PROVIDER_SITE_OTHER): Payer: Medicare Other | Admitting: Nurse Practitioner

## 2019-06-30 VITALS — BP 120/74 | HR 83 | Temp 97.8°F | Ht 60.8 in | Wt 148.4 lb

## 2019-06-30 DIAGNOSIS — F419 Anxiety disorder, unspecified: Secondary | ICD-10-CM | POA: Diagnosis not present

## 2019-06-30 DIAGNOSIS — R7303 Prediabetes: Secondary | ICD-10-CM

## 2019-06-30 DIAGNOSIS — Z23 Encounter for immunization: Secondary | ICD-10-CM | POA: Diagnosis not present

## 2019-06-30 DIAGNOSIS — E785 Hyperlipidemia, unspecified: Secondary | ICD-10-CM

## 2019-06-30 DIAGNOSIS — S8991XA Unspecified injury of right lower leg, initial encounter: Secondary | ICD-10-CM

## 2019-06-30 DIAGNOSIS — E039 Hypothyroidism, unspecified: Secondary | ICD-10-CM

## 2019-06-30 DIAGNOSIS — I10 Essential (primary) hypertension: Secondary | ICD-10-CM | POA: Diagnosis not present

## 2019-06-30 LAB — CMP14+EGFR
ALT: 9 IU/L (ref 0–32)
AST: 12 IU/L (ref 0–40)
Albumin/Globulin Ratio: 1.8 (ref 1.2–2.2)
Albumin: 4.4 g/dL (ref 3.6–4.6)
Alkaline Phosphatase: 105 IU/L (ref 39–117)
BUN/Creatinine Ratio: 18 (ref 12–28)
BUN: 24 mg/dL (ref 8–27)
Bilirubin Total: 0.4 mg/dL (ref 0.0–1.2)
CO2: 21 mmol/L (ref 20–29)
Calcium: 9.7 mg/dL (ref 8.7–10.3)
Chloride: 94 mmol/L — ABNORMAL LOW (ref 96–106)
Creatinine, Ser: 1.34 mg/dL — ABNORMAL HIGH (ref 0.57–1.00)
GFR calc Af Amer: 43 mL/min/{1.73_m2} — ABNORMAL LOW (ref >59)
GFR calc non Af Amer: 37 mL/min/{1.73_m2} — ABNORMAL LOW (ref >59)
Globulin, Total: 2.5 g/dL (ref 1.5–4.5)
Glucose: 102 mg/dL — ABNORMAL HIGH (ref 65–99)
Potassium: 4.9 mmol/L (ref 3.5–5.2)
Sodium: 129 mmol/L — ABNORMAL LOW (ref 134–144)
Total Protein: 6.9 g/dL (ref 6.0–8.5)

## 2019-06-30 LAB — HEMOGLOBIN A1C
Est. average glucose Bld gHb Est-mCnc: 120 mg/dL
Hgb A1c MFr Bld: 5.8 % — ABNORMAL HIGH (ref 4.8–5.6)

## 2019-06-30 LAB — LIPID PANEL
Chol/HDL Ratio: 3.3 ratio (ref 0.0–4.4)
Cholesterol, Total: 156 mg/dL (ref 100–199)
HDL: 48 mg/dL (ref >39)
LDL Chol Calc (NIH): 88 mg/dL (ref 0–99)
Triglycerides: 108 mg/dL (ref 0–149)
VLDL Cholesterol Cal: 20 mg/dL (ref 5–40)

## 2019-06-30 MED ORDER — LEVOTHYROXINE SODIUM 175 MCG PO TABS: ORAL_TABLET | ORAL | 1 refills | Status: DC

## 2019-06-30 MED ORDER — LEVOTHYROXINE SODIUM 200 MCG PO TABS: ORAL_TABLET | ORAL | 1 refills | Status: DC

## 2019-06-30 MED ORDER — PREVNAR 13 IM SUSP: 0.5000 mL | INTRAMUSCULAR | 0 refills | Status: AC

## 2019-06-30 MED ORDER — TETANUS-DIPHTH-ACELL PERTUSSIS 5-2.5-18.5 LF-MCG/0.5 IM SUSP: 0.5000 mL | Freq: Once | INTRAMUSCULAR | 0 refills | Status: AC

## 2019-06-30 NOTE — Progress Notes (Addendum)
Subjective:     Patient ID: Abigail [name] , female    DOB: 12-Aug-1936 , 83 y.o.   MRN: [id number]   Chief Complaint  Patient presents with  . Hypothyroidism    HPI   She is currently wearing a 30 day heart monitor.      Hypertension This is a chronic problem. The current episode started more than 1 year ago. The problem has been gradually worsening since onset. The problem is uncontrolled. Pertinent negatives include no anxiety, chest pain, headaches or palpitations. There are no associated agents to hypertension. Risk factors for coronary artery disease include sedentary lifestyle. Past treatments include ACE inhibitors and diuretics. The current treatment provides no improvement. There are no compliance problems.  There is no history of angina. Identifiable causes of hypertension include a thyroid problem. There is no history of chronic renal disease.  Thyroid Problem Presents for follow-up visit. Patient reports no anxiety, fatigue or palpitations. The symptoms have been resolved.     Past Medical History:  Diagnosis Date  . Carotid artery occlusion   . Complication of anesthesia    hard to wake up per pt  . Diverticulitis   . Hypertension   . Hypothyroid      Family History  Problem Relation Age of Onset  . Hypertension Mother      Current Outpatient Medications:  .  aspirin EC 81 MG tablet, Take 81 mg by mouth daily., Disp: , Rfl:  .  Cranberry 1000 MG CAPS, Take 1 capsule by mouth daily at 12 noon., Disp: , Rfl:  .  diltiazem (CARDIZEM CD) 180 MG 24 hr capsule, TAKE 1 CAPSULE BY MOUTH EVERY DAY, Disp: 90 capsule, Rfl: 0 .  docusate sodium (COLACE) 100 MG capsule, Take 200 mg by mouth daily. , Disp: , Rfl:  .  ibuprofen (ADVIL,MOTRIN) 200 MG tablet, Take 400 mg by mouth every 6 (six) hours as needed for headache or mild pain., Disp: , Rfl:  .  levothyroxine (SYNTHROID) 175 MCG tablet, TAKE 1 TABLET BY MOUTH EVERY OTHER DAY OPPOSITE OF THE 200 MCG, Disp: 45 tablet,  Rfl: 1 .  levothyroxine (SYNTHROID) 200 MCG tablet, TAKE ONE TABLET BY MOUTH DAILY EVERY OTHER DAY (OPPOSITE 175MCG), Disp: 45 tablet, Rfl: 1 .  lisinopril-hydrochlorothiazide (PRINZIDE,ZESTORETIC) 20-25 MG tablet, Take 1 tablet by mouth daily., Disp: 90 tablet, Rfl: 1 .  oxybutynin (DITROPAN-XL) 10 MG 24 hr tablet, TAKE 1 TABLET BY MOUTH EVERYDAY AT BEDTIME, Disp: 90 tablet, Rfl: 1 .  pravastatin (PRAVACHOL) 20 MG tablet, TAKE 1 TABLET BY MOUTH EVERY DAY, Disp: 90 tablet, Rfl: 1 .  busPIRone (BUSPAR) 5 MG tablet, TAKE 1 TABLET BY MOUTH EVERY DAY (Patient not taking: Reported on 06/30/2019), Disp: 90 tablet, Rfl: 0  Current Facility-Administered Medications:  .  Ring Pessary/Support MISC 1 each, 1 each, Does not apply, Once, Anyanwu, Sallyanne Havers, MD   Allergies  Allergen Reactions  . Codeine Nausea And Vomiting  . Norvasc [Amlodipine] Rash and Other (See Comments)    Petechia     Review of Systems  Constitutional: Negative.  Negative for fatigue.  Respiratory: Negative.   Cardiovascular: Negative.  Negative for chest pain, palpitations and leg swelling.  Endocrine: Negative for polydipsia, polyphagia and polyuria.  Neurological: Negative for dizziness and headaches.  Psychiatric/Behavioral: Negative.  The patient is not nervous/anxious.      Today's Vitals   06/30/19 0842  BP: 120/74  Pulse: 83  Temp: 97.8 F (36.6 C)  TempSrc: Oral  Weight: 148 lb 6.4 oz (67.3 kg)  Height: 5' 0.8" (1.544 m)  PainSc: 0-No pain   Body mass index is 28.22 kg/m.   Objective:  Physical Exam Constitutional:      Appearance: Normal appearance.  Cardiovascular:     Rate and Rhythm: Normal rate and regular rhythm.     Pulses: Normal pulses.     Heart sounds: Normal heart sounds. No murmur.  Pulmonary:     Effort: Pulmonary effort is normal. No respiratory distress.     Breath sounds: Normal breath sounds.  Musculoskeletal: Normal range of motion.        General: No tenderness.  Skin:     General: Skin is warm and dry.     Capillary Refill: Capillary refill takes less than 2 seconds.  Neurological:     General: No focal deficit present.     Mental Status: She is alert and oriented to person, place, and time.  Psychiatric:        Mood and Affect: Mood normal.        Behavior: Behavior normal.        Thought Content: Thought content normal.        Judgment: Judgment normal.         Assessment And Plan:     1. Acquired hypothyroidism  Chronic, stable  Continue with current medications - levothyroxine (SYNTHROID) 175 MCG tablet; Take 1 tablet by mouth every other day in the am opposite of 200 mcg  Dispense: 45 tablet; Refill: 1 - levothyroxine (SYNTHROID) 200 MCG tablet; Take 1 tablet by mouth every other day opposite of 175 mcg  Dispense: 45 tablet; Refill: 1  2. Anxiety  Chronic, stable  Continue with current medications  3. Essential hypertension  Chronic, her blood pressure is much better  Continue with current medications - CMP14+EGFR  4. Prediabetes  Chronic, controlled  Continue with current medications  Encouraged to limit intake of sugary foods and drinks  Encouraged to increase physical activity to 150 minutes per week as tolerated - CMP14+EGFR - Hemoglobin A1c  5. Hyperlipidemia, unspecified hyperlipidemia type  Chronic, controlled  Continue with current medications - Lipid Profile  6. Injury of right knee, initial encounter  No abnormal findings on physical findings  7. Encounter for immunization  Sent Rx to pharmacy and advised to  - pneumococcal 13-valent conjugate vaccine (PREVNAR 13) SUSP injection; Inject 0.5 mLs into the muscle tomorrow at 10 am for 1 dose.  Dispense: 0.5 mL; Refill: 0 - Tdap (BOOSTRIX) 5-2.5-18.5 LF-MCG/0.5 injection; Inject 0.5 mLs into the muscle once for 1 dose.  Dispense: 0.5 mL; Refill: 0   [name], FNP    THE PATIENT IS ENCOURAGED TO PRACTICE SOCIAL DISTANCING DUE TO THE COVID-19 PANDEMIC.

## 2019-07-04 ENCOUNTER — Encounter: Payer: Self-pay | Admitting: Nurse Practitioner

## 2019-08-25 ENCOUNTER — Other Ambulatory Visit: Payer: Self-pay | Admitting: Nurse Practitioner

## 2019-09-07 ENCOUNTER — Encounter: Payer: Self-pay | Admitting: Nurse Practitioner

## 2019-09-14 ENCOUNTER — Telehealth: Payer: Self-pay

## 2019-09-15 ENCOUNTER — Ambulatory Visit (INDEPENDENT_AMBULATORY_CARE_PROVIDER_SITE_OTHER): Payer: PPO

## 2019-09-15 ENCOUNTER — Other Ambulatory Visit: Payer: Self-pay

## 2019-09-15 ENCOUNTER — Telehealth: Payer: Self-pay

## 2019-09-15 DIAGNOSIS — I10 Essential (primary) hypertension: Secondary | ICD-10-CM | POA: Diagnosis not present

## 2019-09-15 DIAGNOSIS — E785 Hyperlipidemia, unspecified: Secondary | ICD-10-CM | POA: Diagnosis not present

## 2019-09-15 DIAGNOSIS — R7303 Prediabetes: Secondary | ICD-10-CM

## 2019-09-15 DIAGNOSIS — E039 Hypothyroidism, unspecified: Secondary | ICD-10-CM | POA: Diagnosis not present

## 2019-09-15 DIAGNOSIS — F419 Anxiety disorder, unspecified: Secondary | ICD-10-CM

## 2019-09-16 NOTE — Chronic Care Management (AMB) (Signed)
Chronic Care Management   Initial Visit Note  09/15/2019 Name: Abigail [name] MRN: [id number] DOB: 1935/10/26  Referred by: [name], FNP Reason for referral : Chronic Care Management ([place] Telephone Outreach )   Abigail [name] is a 84 y.o. year old female who is a primary care patient of [name], [place]. The CCM team was consulted for assistance with chronic disease management and care coordination needs related to HTN, Anxiety and Acquired hypothyroidism, Pre-diabetes, Hyperlipidemia  Review of patient status, including review of consultants reports, relevant laboratory and other test results, and collaboration with appropriate care team members and the patient's provider was performed as part of comprehensive patient evaluation and provision of chronic care management services.    SDOH (Social Determinants of Health) screening performed today: Food Insecurity . See Care Plan for related entries.   Placed initial CCM RN CM outbound call to patient to assess for CCM RN CM needs and a care plan was established.   Medications: Outpatient Encounter Medications as of 09/15/2019  Medication Sig  . aspirin EC 81 MG tablet Take 81 mg by mouth daily.  . busPIRone (BUSPAR) 5 MG tablet TAKE 1 TABLET BY MOUTH EVERY DAY (Patient not taking: Reported on 06/30/2019)  . Cranberry 1000 MG CAPS Take 1 capsule by mouth daily at 12 noon.  . diltiazem (CARDIZEM CD) 180 MG 24 hr capsule TAKE 1 CAPSULE BY MOUTH EVERY DAY  . docusate sodium (COLACE) 100 MG capsule Take 200 mg by mouth daily.   [name] Kitchen ibuprofen (ADVIL,MOTRIN) 200 MG tablet Take 400 mg by mouth every 6 (six) hours as needed for headache or mild pain.  [name] Kitchen levothyroxine (SYNTHROID) 175 MCG tablet Take 1 tablet by mouth every other day in the am opposite of 200 mcg  . levothyroxine (SYNTHROID) 200 MCG tablet Take 1 tablet by mouth every other day opposite of 175 mcg  . lisinopril-hydrochlorothiazide (ZESTORETIC) 20-25 MG tablet TAKE 1  TABLET BY MOUTH EVERY DAY  . oxybutynin (DITROPAN-XL) 10 MG 24 hr tablet TAKE 1 TABLET BY MOUTH EVERYDAY AT BEDTIME  . pravastatin (PRAVACHOL) 20 MG tablet TAKE 1 TABLET BY MOUTH EVERY DAY   Facility-Administered Encounter Medications as of 09/15/2019  Medication  . Ring Pessary/Support MISC 1 each     Objective:  Lab Results  Component Value Date   HGBA1C 5.8 (H) 06/30/2019   HGBA1C 5.8 (H) 06/25/2018   Lab Results  Component Value Date   LDLCALC 88 06/30/2019   CREATININE 1.34 (H) 06/30/2019   BP Readings from Last 3 Encounters:  06/30/19 120/74  02/09/19 (!) 180/62  12/29/18 120/80    Goals Addressed      Patient Stated   . "I need to get my COVID vaccines" (pt-stated)       Current Barriers:  [name] Kitchen Knowledge Deficits related to COVID 19 vaccination . Chronic Disease Management support and education needs related to Anxiety secondary to fear of contracting the COVID-19 virus; Acquired hypothyroidism, Essential Hypertension, Pre-diabetes, Hyperlipidemia  Nurse Case Manager Clinical Goal(s):  [name] Kitchen Over the next 30 days, patient will verbalize understanding of plan for receiving the COVID-19 vaccination   Interventions:  . Evaluation of current treatment plan related to COVID 19 vaccine and patient's adherence to plan as established by provider. . Advised patient to contact the [facility] Department to schedule her vaccine by calling [phone] option 2; discussed patient may also register on line but will require an email address; discussed registering patient on [facility]'s  waitlist at which time she will be contacted to schedule an appointment to receive a vaccine; Determined patient prefers to sign up via phone call and will call when ready . Discussed plans with patient for ongoing care management follow up and provided patient with direct contact information for care management team  Patient Self Care Activities:  . Patient verbalizes understanding of plan to  contact the [facility] department to schedule a COVID 19 vaccine . Self administers medications as prescribed . Attends all scheduled provider appointments . Calls pharmacy for medication refills . Performs ADL's independently . Performs IADL's independently . Calls provider office for new concerns or questions  Initial goal documentation    . "I would like to learn more about home delivered meals and food resources" (pt-stated)       Current Barriers:  [name] Kitchen Knowledge Deficits related to eligibility for home delivered meals and food resources . Lacks caregiver support.  . Film/video editor.   Nurse Case Manager Clinical Goal(s):  [name] Kitchen Over the next 45 days, patient will work with embedded BSW Daneen Schick to address needs related to home delivered meals and food resources  Interventions:  . Advised patient to speak with the embedded BSW Daneen Schick to learn more about pursuing home delivered meals and food resources . Collaborated with [place] regarding patient's interest in home delivered meals and food resources; informed Tillie Rung [name] will call when she is ready . Discussed plans with patient for ongoing care management follow up and provided patient with direct contact information for care management team . Determined [name] would prefer to contact Tillie Rung at her leisure to learn more about these resources . Provided patient with the name/contact number for embedded BSW Daneen Schick and encouraged [name] to contact her when ready  Patient Self Care Activities:  . Self administers medications as prescribed . Attends all scheduled provider appointments . Calls pharmacy for medication refills . Performs ADL's independently . Performs IADL's independently . Calls provider office for new concerns or questions  Initial goal documentation     . "to learn more about how to manage my chronic conditions' (pt-stated)       Current Barriers:  [name] Kitchen Knowledge  Deficits related to disease process and Self Health management of Hypothyroidism . Chronic Disease Management support and education needs related to Acquired Hypothyroidism, Essential Hypertension, Pre-diabetes, Hyperlipidemia  Nurse Case Manager Clinical Goal(s):  [name] Kitchen Over the next 90 days, patient will work with the [facility] CM and PCP to address needs related to disease education and support for Acquired hypothyroidism   Interventions:  . Evaluation of current treatment plan related to Acquired hypothyroidism and patient's adherence to plan as established by provider. . Discussed plans with patient for ongoing care management follow up and provided patient with direct contact information for care management team . Provided patient with printed educational materials related to Acquired hypothyroidism . Determined Ms. Harroun would like to review the printed educational materials related to Acquired hypothyroidism and further discuss at next follow up call   Patient Self Care Activities:  . Self administers medications as prescribed . Attends all scheduled provider appointments . Calls pharmacy for medication refills . Performs ADL's independently . Performs IADL's independently . Calls provider office for new concerns or questions  Initial goal documentation        The patient verbalized understanding of instructions provided today and declined a print copy of patient instruction materials.   Telephone follow up  appointment with care management team member scheduled for: 10/11/19  Barb Merino, RN, BSN, CCM Care Management Coordinator [facility]  Direct Phone: [phone]

## 2019-09-16 NOTE — Patient Instructions (Signed)
Visit Information  Goals Addressed      Patient Stated   . "I need to get my COVID vaccines" (pt-stated)       Current Barriers:  [name] Kitchen Knowledge Deficits related to COVID 19 vaccination . Chronic Disease Management support and education needs related to Anxiety secondary to fear of contracting the COVID-19 virus; Acquired hypothyroidism, Essential Hypertension, Pre-diabetes, Hyperlipidemia  Nurse Case Manager Clinical Goal(s):  [name] Kitchen Over the next 30 days, patient will verbalize understanding of plan for receiving the COVID-19 vaccination   Interventions:  . Evaluation of current treatment plan related to COVID 19 vaccine and patient's adherence to plan as established by provider. . Advised patient to contact the [facility] Department to schedule her vaccine by calling [phone] option 2; discussed patient may also register on line but will require an email address; discussed registering patient on [facility]'s waitlist at which time she will be contacted to schedule an appointment to receive a vaccine; Determined patient prefers to sign up via phone call and will call when ready . Discussed plans with patient for ongoing care management follow up and provided patient with direct contact information for care management team  Patient Self Care Activities:  . Patient verbalizes understanding of plan to contact the [facility] department to schedule a COVID 19 vaccine . Self administers medications as prescribed . Attends all scheduled provider appointments . Calls pharmacy for medication refills . Performs ADL's independently . Performs IADL's independently . Calls provider office for new concerns or questions  Initial goal documentation    . "I would like to learn more about home delivered meals and food resources" (pt-stated)       Current Barriers:  [name] Kitchen Knowledge Deficits related to eligibility for home delivered meals and food resources . Lacks caregiver support.   . Film/video editor.   Nurse Case Manager Clinical Goal(s):  [name] Kitchen Over the next 45 days, patient will work with embedded BSW Daneen Schick to address needs related to home delivered meals and food resources  Interventions:  . Advised patient to speak with the embedded BSW Daneen Schick to learn more about pursuing home delivered meals and food resources . Collaborated with [place] regarding patient's interest in home delivered meals and food resources; informed Tillie Rung Ms. Vinluan will call when she is ready . Discussed plans with patient for ongoing care management follow up and provided patient with direct contact information for care management team . Determined Ms. Danish would prefer to contact Tillie Rung at her leisure to learn more about these resources . Provided patient with the name/contact number for embedded BSW Daneen Schick and encouraged Ms. Lebon to contact her when ready  Patient Self Care Activities:  . Self administers medications as prescribed . Attends all scheduled provider appointments . Calls pharmacy for medication refills . Performs ADL's independently . Performs IADL's independently . Calls provider office for new concerns or questions  Initial goal documentation     . "to learn more about how to manage my chronic conditions' (pt-stated)       Current Barriers:  [name] Kitchen Knowledge Deficits related to disease process and Self Health management of Hypothyroidism . Chronic Disease Management support and education needs related to Acquired Hypothyroidism, Essential Hypertension, Pre-diabetes, Hyperlipidemia  Nurse Case Manager Clinical Goal(s):  [name] Kitchen Over the next 90 days, patient will work with the [place] CM and PCP to address needs related to disease education and support for Acquired hypothyroidism   Interventions:  .  Evaluation of current treatment plan related to Acquired hypothyroidism and patient's adherence to plan as established by provider. . Discussed  plans with patient for ongoing care management follow up and provided patient with direct contact information for care management team . Provided patient with printed educational materials related to Acquired hypothyroidism . Determined Ms. Lyter would like to review the printed educational materials related to Acquired hypothyroidism and further discuss at next follow up call   Patient Self Care Activities:  . Self administers medications as prescribed . Attends all scheduled provider appointments . Calls pharmacy for medication refills . Performs ADL's independently . Performs IADL's independently . Calls provider office for new concerns or questions  Initial goal documentation        The patient verbalized understanding of instructions provided today and declined a print copy of patient instruction materials.   Telephone follow up appointment with care management team member scheduled for: 10/11/19  Barb Merino, RN, BSN, CCM Care Management Coordinator [facility]  Direct Phone: [phone]

## 2019-09-18 ENCOUNTER — Other Ambulatory Visit: Payer: Self-pay | Admitting: Nurse Practitioner

## 2019-09-18 DIAGNOSIS — N3281 Overactive bladder: Secondary | ICD-10-CM

## 2019-10-11 ENCOUNTER — Telehealth: Payer: Self-pay

## 2019-10-26 ENCOUNTER — Emergency Department (HOSPITAL_COMMUNITY): Payer: PPO

## 2019-10-26 ENCOUNTER — Inpatient Hospital Stay (HOSPITAL_COMMUNITY)
Admission: EM | Disposition: A | Discharge status: Home Health | Payer: Self-pay | Source: Home / Self Care | Attending: Cardiovascular Disease

## 2019-10-26 ENCOUNTER — Other Ambulatory Visit: Payer: Self-pay

## 2019-10-26 ENCOUNTER — Inpatient Hospital Stay (HOSPITAL_COMMUNITY)
Admission: EM | Admit: 2019-10-26 | Discharge: 2019-10-28 | DRG: 247 | Disposition: A | Discharge status: Home Health | Payer: PPO | Attending: Cardiovascular Disease | Admitting: Cardiovascular Disease

## 2019-10-26 DIAGNOSIS — Z885 Allergy status to narcotic agent status: Secondary | ICD-10-CM

## 2019-10-26 DIAGNOSIS — F419 Anxiety disorder, unspecified: Secondary | ICD-10-CM | POA: Diagnosis present

## 2019-10-26 DIAGNOSIS — I213 ST elevation (STEMI) myocardial infarction of unspecified site: Secondary | ICD-10-CM

## 2019-10-26 DIAGNOSIS — J80 Acute respiratory distress syndrome: Secondary | ICD-10-CM | POA: Diagnosis not present

## 2019-10-26 DIAGNOSIS — Z8249 Family history of ischemic heart disease and other diseases of the circulatory system: Secondary | ICD-10-CM

## 2019-10-26 DIAGNOSIS — Z7982 Long term (current) use of aspirin: Secondary | ICD-10-CM | POA: Diagnosis not present

## 2019-10-26 DIAGNOSIS — Z87891 Personal history of nicotine dependence: Secondary | ICD-10-CM | POA: Diagnosis not present

## 2019-10-26 DIAGNOSIS — E785 Hyperlipidemia, unspecified: Secondary | ICD-10-CM | POA: Diagnosis present

## 2019-10-26 DIAGNOSIS — Z79890 Hormone replacement therapy (postmenopausal): Secondary | ICD-10-CM

## 2019-10-26 DIAGNOSIS — I2111 ST elevation (STEMI) myocardial infarction involving right coronary artery: Secondary | ICD-10-CM | POA: Diagnosis present

## 2019-10-26 DIAGNOSIS — Z79899 Other long term (current) drug therapy: Secondary | ICD-10-CM

## 2019-10-26 DIAGNOSIS — I129 Hypertensive chronic kidney disease with stage 1 through stage 4 chronic kidney disease, or unspecified chronic kidney disease: Secondary | ICD-10-CM | POA: Diagnosis present

## 2019-10-26 DIAGNOSIS — I499 Cardiac arrhythmia, unspecified: Secondary | ICD-10-CM | POA: Diagnosis not present

## 2019-10-26 DIAGNOSIS — R0789 Other chest pain: Secondary | ICD-10-CM | POA: Diagnosis not present

## 2019-10-26 DIAGNOSIS — I2510 Atherosclerotic heart disease of native coronary artery without angina pectoris: Secondary | ICD-10-CM | POA: Diagnosis present

## 2019-10-26 DIAGNOSIS — E039 Hypothyroidism, unspecified: Secondary | ICD-10-CM | POA: Diagnosis present

## 2019-10-26 DIAGNOSIS — I10 Essential (primary) hypertension: Secondary | ICD-10-CM | POA: Diagnosis not present

## 2019-10-26 DIAGNOSIS — Z20822 Contact with and (suspected) exposure to covid-19: Secondary | ICD-10-CM | POA: Diagnosis present

## 2019-10-26 DIAGNOSIS — N183 Chronic kidney disease, stage 3 unspecified: Secondary | ICD-10-CM | POA: Diagnosis present

## 2019-10-26 DIAGNOSIS — Z888 Allergy status to other drugs, medicaments and biological substances status: Secondary | ICD-10-CM | POA: Diagnosis not present

## 2019-10-26 DIAGNOSIS — Z955 Presence of coronary angioplasty implant and graft: Secondary | ICD-10-CM

## 2019-10-26 DIAGNOSIS — R7303 Prediabetes: Secondary | ICD-10-CM | POA: Diagnosis present

## 2019-10-26 DIAGNOSIS — R079 Chest pain, unspecified: Secondary | ICD-10-CM | POA: Diagnosis not present

## 2019-10-26 DIAGNOSIS — R0989 Other specified symptoms and signs involving the circulatory and respiratory systems: Secondary | ICD-10-CM | POA: Diagnosis not present

## 2019-10-26 HISTORY — DX: ST elevation (STEMI) myocardial infarction of unspecified site: I21.3

## 2019-10-26 HISTORY — PX: LEFT HEART CATH AND CORONARY ANGIOGRAPHY: CATH118249

## 2019-10-26 HISTORY — PX: CORONARY/GRAFT ACUTE MI REVASCULARIZATION: CATH118305

## 2019-10-26 LAB — I-STAT CHEM 8, ED
BUN: 30 mg/dL — ABNORMAL HIGH (ref 8–23)
Calcium, Ion: 1.06 mmol/L — ABNORMAL LOW (ref 1.15–1.40)
Chloride: 99 mmol/L (ref 98–111)
Creatinine, Ser: 1.6 mg/dL — ABNORMAL HIGH (ref 0.44–1.00)
Glucose, Bld: 124 mg/dL — ABNORMAL HIGH (ref 70–99)
HCT: 33 % — ABNORMAL LOW (ref 36.0–46.0)
Hemoglobin: 11.2 g/dL — ABNORMAL LOW (ref 12.0–15.0)
Potassium: 4.3 mmol/L (ref 3.5–5.1)
Sodium: 132 mmol/L — ABNORMAL LOW (ref 135–145)
TCO2: 26 mmol/L (ref 22–32)

## 2019-10-26 LAB — APTT: aPTT: 29 seconds (ref 24–36)

## 2019-10-26 LAB — CBC WITH DIFFERENTIAL/PLATELET
Abs Immature Granulocytes: 0.04 10*3/uL (ref 0.00–0.07)
Basophils Absolute: 0.1 10*3/uL (ref 0.0–0.1)
Basophils Relative: 1 %
Eosinophils Absolute: 0.2 10*3/uL (ref 0.0–0.5)
Eosinophils Relative: 3 %
HCT: 35.5 % — ABNORMAL LOW (ref 36.0–46.0)
Hemoglobin: 10.9 g/dL — ABNORMAL LOW (ref 12.0–15.0)
Immature Granulocytes: 1 %
Lymphocytes Relative: 17 %
Lymphs Abs: 1.4 10*3/uL (ref 0.7–4.0)
MCH: 27.7 pg (ref 26.0–34.0)
MCHC: 30.7 g/dL (ref 30.0–36.0)
MCV: 90.3 fL (ref 80.0–100.0)
Monocytes Absolute: 1.3 10*3/uL — ABNORMAL HIGH (ref 0.1–1.0)
Monocytes Relative: 15 %
Neutro Abs: 5.5 10*3/uL (ref 1.7–7.7)
Neutrophils Relative %: 63 %
Platelets: 363 10*3/uL (ref 150–400)
RBC: 3.93 MIL/uL (ref 3.87–5.11)
RDW: 14.1 % (ref 11.5–15.5)
WBC: 8.5 10*3/uL (ref 4.0–10.5)
nRBC: 0 % (ref 0.0–0.2)

## 2019-10-26 LAB — RESPIRATORY PANEL BY RT PCR (FLU A&B, COVID)
Influenza A by PCR: NEGATIVE
Influenza B by PCR: NEGATIVE
SARS Coronavirus 2 by RT PCR: NEGATIVE

## 2019-10-26 LAB — HEMOGLOBIN A1C
Hgb A1c MFr Bld: 5.8 % — ABNORMAL HIGH (ref 4.8–5.6)
Mean Plasma Glucose: 119.76 mg/dL

## 2019-10-26 LAB — LIPID PANEL
Cholesterol: 190 mg/dL (ref 0–200)
HDL: 40 mg/dL — ABNORMAL LOW (ref >40)
LDL Cholesterol: 110 mg/dL — ABNORMAL HIGH (ref 0–99)
Total CHOL/HDL Ratio: 4.8 RATIO
Triglycerides: 198 mg/dL — ABNORMAL HIGH (ref <150)
VLDL: 40 mg/dL (ref 0–40)

## 2019-10-26 LAB — PROTIME-INR
INR: 1 (ref 0.8–1.2)
Prothrombin Time: 13.4 seconds (ref 11.4–15.2)

## 2019-10-26 SURGERY — CORONARY/GRAFT ACUTE MI REVASCULARIZATION
Anesthesia: LOCAL

## 2019-10-26 MED ORDER — TICAGRELOR 90 MG PO TABS
ORAL_TABLET | ORAL | Status: DC | PRN
  Administered 2019-10-26: 180 mg via ORAL

## 2019-10-26 MED ORDER — SODIUM CHLORIDE 0.9 % IV SOLN
INTRAVENOUS | Status: AC | PRN
  Administered 2019-10-26: 1.75 mg/kg/h via INTRAVENOUS

## 2019-10-26 MED ORDER — NITROGLYCERIN 1 MG/10 ML FOR IR/CATH LAB
INTRA_ARTERIAL | Status: AC
  Filled 2019-10-26: qty 10

## 2019-10-26 MED ORDER — TICAGRELOR 90 MG PO TABS
ORAL_TABLET | ORAL | Status: AC
  Filled 2019-10-26: qty 2

## 2019-10-26 MED ORDER — LIDOCAINE HCL (PF) 1 % IJ SOLN
INTRAMUSCULAR | Status: DC | PRN
  Administered 2019-10-26: 2 mL
  Administered 2019-10-26: 15 mL

## 2019-10-26 MED ORDER — FENTANYL CITRATE (PF) 100 MCG/2ML IJ SOLN
INTRAMUSCULAR | Status: AC
  Filled 2019-10-26: qty 2

## 2019-10-26 MED ORDER — VERAPAMIL HCL 2.5 MG/ML IV SOLN
INTRAVENOUS | Status: AC
  Filled 2019-10-26: qty 2

## 2019-10-26 MED ORDER — LIDOCAINE HCL (PF) 1 % IJ SOLN
INTRAMUSCULAR | Status: AC
  Filled 2019-10-26: qty 30

## 2019-10-26 MED ORDER — HEPARIN (PORCINE) IN NACL 1000-0.9 UT/500ML-% IV SOLN
INTRAVENOUS | Status: AC
  Filled 2019-10-26: qty 1000

## 2019-10-26 MED ORDER — MIDAZOLAM HCL 2 MG/2ML IJ SOLN
INTRAMUSCULAR | Status: AC
  Filled 2019-10-26: qty 2

## 2019-10-26 MED ORDER — NITROGLYCERIN 1 MG/10 ML FOR IR/CATH LAB
INTRA_ARTERIAL | Status: DC | PRN
  Administered 2019-10-26 (×2): 150 ug via INTRACORONARY

## 2019-10-26 MED ORDER — MIDAZOLAM HCL 2 MG/2ML IJ SOLN
INTRAMUSCULAR | Status: DC | PRN
  Administered 2019-10-26 (×2): 1 mg via INTRAVENOUS

## 2019-10-26 MED ORDER — BIVALIRUDIN TRIFLUOROACETATE 250 MG IV SOLR
INTRAVENOUS | Status: AC
  Filled 2019-10-26: qty 250

## 2019-10-26 MED ORDER — HEPARIN (PORCINE) IN NACL 1000-0.9 UT/500ML-% IV SOLN
INTRAVENOUS | Status: DC | PRN
  Administered 2019-10-26 (×2): 500 mL

## 2019-10-26 MED ORDER — IOHEXOL 350 MG/ML SOLN
INTRAVENOUS | Status: AC
  Filled 2019-10-26: qty 1

## 2019-10-26 MED ORDER — BIVALIRUDIN BOLUS VIA INFUSION - CUPID
INTRAVENOUS | Status: DC | PRN
  Administered 2019-10-26: 49.35 mg via INTRAVENOUS

## 2019-10-26 MED ORDER — FENTANYL CITRATE (PF) 100 MCG/2ML IJ SOLN
INTRAMUSCULAR | Status: DC | PRN
  Administered 2019-10-26 (×2): 25 ug via INTRAVENOUS

## 2019-10-26 MED ORDER — IOHEXOL 350 MG/ML SOLN
INTRAVENOUS | Status: DC | PRN
  Administered 2019-10-26: 80 mL

## 2019-10-26 MED ORDER — HEPARIN SODIUM (PORCINE) 5000 UNIT/ML IJ SOLN: 60.0000 [IU]/kg | Freq: Once | INTRAMUSCULAR | Status: DC

## 2019-10-26 MED ORDER — HEPARIN SODIUM (PORCINE) 5000 UNIT/ML IJ SOLN
4000.0000 [IU] | Freq: Once | INTRAMUSCULAR | Status: AC
  Administered 2019-10-26: 4000 [IU] via INTRAVENOUS

## 2019-10-26 SURGICAL SUPPLY — 21 items
BALLN SAPPHIRE 2.5X12 (BALLOONS) ×2
BALLN SAPPHIRE ~~LOC~~ 3.0X18 (BALLOONS) ×2 IMPLANT
BALLOON SAPPHIRE 2.5X12 (BALLOONS) ×1 IMPLANT
CATH 5FR JL3.5 JR4 ANG PIG MP (CATHETERS) ×2 IMPLANT
CATH VISTA GUIDE 6FR IM 90 CM (CATHETERS) ×2 IMPLANT
DEVICE CLOSURE PERCLS PRGLD 6F (VASCULAR PRODUCTS) ×1 IMPLANT
GLIDESHEATH SLEND SS 6F .021 (SHEATH) ×2 IMPLANT
GUIDEWIRE INQWIRE 1.5J.035X260 (WIRE) ×1 IMPLANT
INQWIRE 1.5J .035X260CM (WIRE) ×2
KIT ENCORE 26 ADVANTAGE (KITS) ×2 IMPLANT
KIT HEART LEFT (KITS) ×2 IMPLANT
PACK CARDIAC CATHETERIZATION (CUSTOM PROCEDURE TRAY) ×2 IMPLANT
PERCLOSE PROGLIDE 6F (VASCULAR PRODUCTS) ×2
SHEATH PINNACLE 6F 10CM (SHEATH) ×2 IMPLANT
SHEATH PROBE COVER 6X72 (BAG) ×2 IMPLANT
STENT SYNERGY XD 2.75X24 (Permanent Stent) ×1 IMPLANT
SYNERGY XD 2.75X24 (Permanent Stent) ×2 IMPLANT
TRANSDUCER W/STOPCOCK (MISCELLANEOUS) ×2 IMPLANT
TUBING CIL FLEX 10 FLL-RA (TUBING) ×2 IMPLANT
WIRE COUGAR XT STRL 190CM (WIRE) ×2 IMPLANT
WIRE EMERALD 3MM-J .035X150CM (WIRE) ×2 IMPLANT

## 2019-10-26 NOTE — ED Triage Notes (Signed)
Patient called EMS for SOB; received 1 nitro, 18 ga LAC, 324 asa pta, and oxygen. Pt was substernal, non-radiating chest pressure that is intermittent which subsided after one nitro.

## 2019-10-26 NOTE — ED Provider Notes (Signed)
Emergency Department Provider Note   I have reviewed the triage vital signs and the nursing notes.   HISTORY  Chief Complaint Code STEMI   HPI TALEEYA BLONDIN is a 84 y.o. female who presents with cp and sob. Intermittently for the last two days. Associated with SOB. No fever. No cough. EMS called found to have STE in inferior leads with some depressions.    Level V Caveat secondary to acuity of condition necessitating rapid treatment.   Past Medical History:  Diagnosis Date  . Carotid artery occlusion   . Complication of anesthesia    hard to wake up per pt  . Diverticulitis   . Hypertension   . Hypothyroid     Patient Active Problem List   Diagnosis Date Noted  . STEMI involving right coronary artery ([place]) 10/27/2019  . STEMI (ST elevation myocardial infarction) ([place]) 10/26/2019  . Anxiety 02/09/2019  . Prediabetes 12/29/2018  . Upper respiratory tract infection 10/21/2018  . Essential hypertension 08/27/2018  . Vitamin D deficiency 06/25/2018  . Hyperlipidemia 06/25/2018  . Hypothyroidism 06/25/2018  . Benign hypertension with CKD (chronic kidney disease) stage III 06/25/2018  . Chronic kidney disease, stage III (moderate) 06/25/2018  . OAB (overactive bladder) 08/26/2016  . Complete uterine prolapse 06/21/2015  . CAP (community acquired pneumonia) 04/12/2012  . Bacteremia 04/08/2012  . Urinary tract infection without hematuria 04/05/2012  . Nausea and vomiting 04/05/2012  . LLQ abdominal pain 04/05/2012  . Acute renal failure ([place]) 04/05/2012  . Hyponatremia 04/05/2012    Past Surgical History:  Procedure Laterality Date  . BACK SURGERY  1980  . CORONARY/GRAFT ACUTE MI REVASCULARIZATION N/A 10/26/2019   Procedure: Coronary/Graft Acute MI Revascularization;  Surgeon: Sherren Mocha, MD;  Location: [facility];  Service: Cardiovascular;  Laterality: N/A;  . ELBOW SURGERY     left  . FRACTURE SURGERY    . LEFT HEART CATH AND CORONARY ANGIOGRAPHY N/A  10/26/2019   Procedure: LEFT HEART CATH AND CORONARY ANGIOGRAPHY;  Surgeon: Sherren Mocha, MD;  Location: [facility];  Service: Cardiovascular;  Laterality: N/A;  . SPINE SURGERY        Allergies Codeine and Norvasc [amlodipine]  Family History  Problem Relation Age of Onset  . Hypertension Mother     Social History Social History   Tobacco Use  . Smoking status: Former Smoker    Quit date: 04/05/1986    Years since quitting: 33.5  . Smokeless tobacco: Never Used  Substance Use Topics  . Alcohol use: No  . Drug use: No    Review of Systems  Level V Caveat secondary to acuity of condition necessitating rapid treatment.  ____________________________________________   PHYSICAL EXAM:  VITAL SIGNS: ED Triage Vitals  Enc Vitals Group     BP 10/26/19 2241 (!) 147/90     Pulse Rate 10/26/19 2241 73     Resp 10/26/19 2241 18     Temp 10/26/19 2241 98.3 F (36.8 C)     Temp Source 10/26/19 2241 Oral     SpO2 10/26/19 2241 97 %     Weight 10/26/19 2244 145 lb (65.8 kg)     Height 10/26/19 2244 5\' 6"  (1.676 m)    Constitutional: Alert and oriented. Well appearing and in no acute distress. Eyes: Conjunctivae are normal. PERRL. EOMI. Head: Atraumatic. Nose: No congestion/rhinnorhea. Mouth/Throat: Mucous membranes are moist.  Oropharynx non-erythematous. Neck: No stridor.  No meningeal signs.   Cardiovascular: Normal rate, regular rhythm. Good  peripheral circulation. Grossly normal heart sounds.   Respiratory: Normal respiratory effort.  No retractions. Lungs CTAB. Gastrointestinal: Soft and nontender. No distention.  Musculoskeletal: No lower extremity tenderness nor edema. No gross deformities of extremities. Neurologic:  Normal speech and language. No gross focal neurologic deficits are appreciated.  Skin:  Skin is warm, dry and intact. No rash noted.   ____________________________________________   LABS (all labs ordered are listed, but only abnormal  results are displayed)  Labs Reviewed  HEMOGLOBIN A1C - Abnormal; Notable for the following components:      Result Value   Hgb A1c MFr Bld 5.8 (*)    All other components within normal limits  CBC WITH DIFFERENTIAL/PLATELET - Abnormal; Notable for the following components:   Hemoglobin 10.9 (*)    HCT 35.5 (*)    Monocytes Absolute 1.3 (*)    All other components within normal limits  COMPREHENSIVE METABOLIC PANEL - Abnormal; Notable for the following components:   Sodium 132 (*)    CO2 21 (*)    Glucose, Bld 124 (*)    BUN 28 (*)    Creatinine, Ser 1.51 (*)    GFR calc non Af Amer 32 (*)    GFR calc Af Amer 37 (*)    All other components within normal limits  LIPID PANEL - Abnormal; Notable for the following components:   Triglycerides 198 (*)    HDL 40 (*)    LDL Cholesterol 110 (*)    All other components within normal limits  BASIC METABOLIC PANEL - Abnormal; Notable for the following components:   Sodium 134 (*)    Glucose, Bld 108 (*)    BUN 24 (*)    Creatinine, Ser 1.20 (*)    Calcium 8.7 (*)    GFR calc non Af Amer 42 (*)    GFR calc Af Amer 49 (*)    All other components within normal limits  CBC - Abnormal; Notable for the following components:   RBC 3.57 (*)    Hemoglobin 10.0 (*)    HCT 31.2 (*)    All other components within normal limits  I-STAT CHEM 8, ED - Abnormal; Notable for the following components:   Sodium 132 (*)    BUN 30 (*)    Creatinine, Ser 1.60 (*)    Glucose, Bld 124 (*)    Calcium, Ion 1.06 (*)    Hemoglobin 11.2 (*)    HCT 33.0 (*)    All other components within normal limits  RESPIRATORY PANEL BY RT PCR (FLU A&B, COVID)  MRSA PCR SCREENING  PROTIME-INR  APTT  TSH  TROPONIN I (HIGH SENSITIVITY)   ____________________________________________  EKG   EKG Interpretation  Date/Time:  Wednesday October 26 2019 22:37:54 EST Ventricular Rate:  74 PR Interval:    QRS Duration: 99 QT Interval:  337 QTC Calculation: 374 R  Axis:   56 Text Interpretation: Sinus or ectopic atrial rhythm Repol abnrm suggests ischemia, diffuse leads ST elevation in III new new depressions in I and aVL Confirmed by Merrily Pew [phone]) on 10/26/2019 10:52:35 PM       ____________________________________________  RADIOLOGY  DG Chest Portable 1 View  Result Date: 10/26/2019 CLINICAL DATA:  STEMI EXAM: PORTABLE CHEST 1 VIEW COMPARISON:  07/08/2018 FINDINGS: Cardiac shadow is within normal limits. Aortic calcifications are seen. The lungs are well aerated bilaterally. Mild central vascular congestion is seen without interstitial edema. No bony abnormality is noted. IMPRESSION: Mild vascular congestion.  No other  focal abnormality is seen. Electronically Signed   By: Inez Catalina M.D.   On: 10/26/2019 23:02    ____________________________________________   PROCEDURES  Procedure(s) performed:   .Critical Care Performed by: Merrily Pew, MD Authorized by: Merrily Pew, MD   Critical care provider statement:    Critical care time (minutes):  45   Critical care time was exclusive of:  Separately billable procedures and treating other patients and teaching time   Critical care was necessary to treat or prevent imminent or life-threatening deterioration of the following conditions:  Cardiac failure   Critical care was time spent personally by me on the following activities:  Discussions with consultants, evaluation of patient's response to treatment, examination of patient, ordering and performing treatments and interventions, ordering and review of laboratory studies, ordering and review of radiographic studies, pulse oximetry, re-evaluation of patient's condition, obtaining history from patient or surrogate and review of old charts   ____________________________________________   INITIAL IMPRESSION / [place] / ED COURSE  STEMI. Stable. Taking to cath lab.   Pertinent labs & imaging results that were available  during my care of the patient were reviewed by me and considered in my medical decision making (see chart for details).  ____________________________________________  FINAL CLINICAL IMPRESSION(S) / ED DIAGNOSES  Final diagnoses:  STEMI (ST elevation myocardial infarction) ([place])  ST elevation myocardial infarction (STEMI), unspecified artery ([place])     MEDICATIONS GIVEN DURING THIS VISIT:  Medications  heparin injection 3,950 Units ( Intravenous MAR Unhold 10/27/19 0012)  aspirin EC tablet 81 mg (has no administration in time range)  docusate sodium (COLACE) capsule 200 mg (has no administration in time range)  levothyroxine (SYNTHROID) tablet 175 mcg (has no administration in time range)  levothyroxine (SYNTHROID) tablet 200 mcg (has no administration in time range)  oxybutynin (DITROPAN-XL) 24 hr tablet 10 mg (10 mg Oral Not Given 10/27/19 0030)  labetalol (NORMODYNE) injection 10 mg (has no administration in time range)  hydrALAZINE (APRESOLINE) injection 10 mg (has no administration in time range)  acetaminophen (TYLENOL) tablet 650 mg (has no administration in time range)  ondansetron (ZOFRAN) injection 4 mg (has no administration in time range)  0.9% sodium chloride infusion (1 mL/kg/hr  65.8 kg Intravenous New Bag/Given 10/27/19 0022)  sodium chloride flush (NS) 0.9 % injection 3 mL (has no administration in time range)  sodium chloride flush (NS) 0.9 % injection 3 mL (has no administration in time range)  0.9 %  sodium chloride infusion (has no administration in time range)  atorvastatin (LIPITOR) tablet 80 mg (has no administration in time range)  ticagrelor (BRILINTA) tablet 90 mg (has no administration in time range)  nitroGLYCERIN (NITROSTAT) SL tablet 0.4 mg (has no administration in time range)  heparin injection 5,000 Units (has no administration in time range)  metoprolol tartrate (LOPRESSOR) tablet 12.5 mg (has no administration in time range)  Chlorhexidine Gluconate  Cloth 2 % PADS 6 each (has no administration in time range)  heparin injection 4,000 Units (4,000 Units Intravenous Given 10/26/19 2253)  bivalirudin (ANGIOMAX) 250 mg in sodium chloride 0.9 % 50 mL (5 mg/mL) infusion (1.75 mg/kg/hr  65.8 kg Intravenous New Bag/Given 10/26/19 2324)     NEW OUTPATIENT MEDICATIONS STARTED DURING THIS VISIT:  Current Discharge Medication List      Note:  This note was prepared with assistance of Dragon voice recognition software. Occasional wrong-word or sound-a-like substitutions may have occurred due to the inherent limitations of voice recognition software.  Gabbrielle Mcnicholas, Corene Cornea, MD 10/27/19 [phone]

## 2019-10-27 ENCOUNTER — Inpatient Hospital Stay (HOSPITAL_COMMUNITY): Payer: PPO

## 2019-10-27 DIAGNOSIS — E785 Hyperlipidemia, unspecified: Secondary | ICD-10-CM | POA: Diagnosis not present

## 2019-10-27 DIAGNOSIS — I2510 Atherosclerotic heart disease of native coronary artery without angina pectoris: Secondary | ICD-10-CM | POA: Diagnosis not present

## 2019-10-27 DIAGNOSIS — Z888 Allergy status to other drugs, medicaments and biological substances status: Secondary | ICD-10-CM | POA: Diagnosis not present

## 2019-10-27 DIAGNOSIS — N183 Chronic kidney disease, stage 3 unspecified: Secondary | ICD-10-CM | POA: Diagnosis not present

## 2019-10-27 DIAGNOSIS — F419 Anxiety disorder, unspecified: Secondary | ICD-10-CM | POA: Diagnosis not present

## 2019-10-27 DIAGNOSIS — R079 Chest pain, unspecified: Secondary | ICD-10-CM | POA: Diagnosis not present

## 2019-10-27 DIAGNOSIS — Z79890 Hormone replacement therapy (postmenopausal): Secondary | ICD-10-CM | POA: Diagnosis not present

## 2019-10-27 DIAGNOSIS — I2111 ST elevation (STEMI) myocardial infarction involving right coronary artery: Principal | ICD-10-CM

## 2019-10-27 DIAGNOSIS — Z885 Allergy status to narcotic agent status: Secondary | ICD-10-CM | POA: Diagnosis not present

## 2019-10-27 DIAGNOSIS — Z79899 Other long term (current) drug therapy: Secondary | ICD-10-CM | POA: Diagnosis not present

## 2019-10-27 DIAGNOSIS — E039 Hypothyroidism, unspecified: Secondary | ICD-10-CM | POA: Diagnosis not present

## 2019-10-27 DIAGNOSIS — R7303 Prediabetes: Secondary | ICD-10-CM | POA: Diagnosis not present

## 2019-10-27 DIAGNOSIS — I129 Hypertensive chronic kidney disease with stage 1 through stage 4 chronic kidney disease, or unspecified chronic kidney disease: Secondary | ICD-10-CM | POA: Diagnosis not present

## 2019-10-27 DIAGNOSIS — I213 ST elevation (STEMI) myocardial infarction of unspecified site: Secondary | ICD-10-CM | POA: Diagnosis present

## 2019-10-27 DIAGNOSIS — Z8249 Family history of ischemic heart disease and other diseases of the circulatory system: Secondary | ICD-10-CM | POA: Diagnosis not present

## 2019-10-27 DIAGNOSIS — Z87891 Personal history of nicotine dependence: Secondary | ICD-10-CM | POA: Diagnosis not present

## 2019-10-27 DIAGNOSIS — Z7982 Long term (current) use of aspirin: Secondary | ICD-10-CM | POA: Diagnosis not present

## 2019-10-27 DIAGNOSIS — Z20822 Contact with and (suspected) exposure to covid-19: Secondary | ICD-10-CM | POA: Diagnosis not present

## 2019-10-27 LAB — COMPREHENSIVE METABOLIC PANEL
ALT: 14 U/L (ref 0–44)
AST: 18 U/L (ref 15–41)
Albumin: 3.6 g/dL (ref 3.5–5.0)
Alkaline Phosphatase: 79 U/L (ref 38–126)
Anion gap: 11 (ref 5–15)
BUN: 28 mg/dL — ABNORMAL HIGH (ref 8–23)
CO2: 21 mmol/L — ABNORMAL LOW (ref 22–32)
Calcium: 9.1 mg/dL (ref 8.9–10.3)
Chloride: 100 mmol/L (ref 98–111)
Creatinine, Ser: 1.51 mg/dL — ABNORMAL HIGH (ref 0.44–1.00)
GFR calc Af Amer: 37 mL/min — ABNORMAL LOW (ref >60)
GFR calc non Af Amer: 32 mL/min — ABNORMAL LOW (ref >60)
Glucose, Bld: 124 mg/dL — ABNORMAL HIGH (ref 70–99)
Potassium: 4.3 mmol/L (ref 3.5–5.1)
Sodium: 132 mmol/L — ABNORMAL LOW (ref 135–145)
Total Bilirubin: 0.3 mg/dL (ref 0.3–1.2)
Total Protein: 7.1 g/dL (ref 6.5–8.1)

## 2019-10-27 LAB — ECHOCARDIOGRAM COMPLETE
Height: 66 in
Weight: 2320.02 oz

## 2019-10-27 LAB — POCT I-STAT, CHEM 8
BUN: 28 mg/dL — ABNORMAL HIGH (ref 8–23)
Calcium, Ion: 1.17 mmol/L (ref 1.15–1.40)
Chloride: 99 mmol/L (ref 98–111)
Creatinine, Ser: 1.4 mg/dL — ABNORMAL HIGH (ref 0.44–1.00)
Glucose, Bld: 125 mg/dL — ABNORMAL HIGH (ref 70–99)
HCT: 31 % — ABNORMAL LOW (ref 36.0–46.0)
Hemoglobin: 10.5 g/dL — ABNORMAL LOW (ref 12.0–15.0)
Potassium: 4.2 mmol/L (ref 3.5–5.1)
Sodium: 132 mmol/L — ABNORMAL LOW (ref 135–145)
TCO2: 24 mmol/L (ref 22–32)

## 2019-10-27 LAB — CBC
HCT: 31.2 % — ABNORMAL LOW (ref 36.0–46.0)
Hemoglobin: 10 g/dL — ABNORMAL LOW (ref 12.0–15.0)
MCH: 28 pg (ref 26.0–34.0)
MCHC: 32.1 g/dL (ref 30.0–36.0)
MCV: 87.4 fL (ref 80.0–100.0)
Platelets: 347 10*3/uL (ref 150–400)
RBC: 3.57 MIL/uL — ABNORMAL LOW (ref 3.87–5.11)
RDW: 14.2 % (ref 11.5–15.5)
WBC: 8.6 10*3/uL (ref 4.0–10.5)
nRBC: 0 % (ref 0.0–0.2)

## 2019-10-27 LAB — BASIC METABOLIC PANEL
Anion gap: 9 (ref 5–15)
BUN: 24 mg/dL — ABNORMAL HIGH (ref 8–23)
CO2: 23 mmol/L (ref 22–32)
Calcium: 8.7 mg/dL — ABNORMAL LOW (ref 8.9–10.3)
Chloride: 102 mmol/L (ref 98–111)
Creatinine, Ser: 1.2 mg/dL — ABNORMAL HIGH (ref 0.44–1.00)
GFR calc Af Amer: 49 mL/min — ABNORMAL LOW (ref >60)
GFR calc non Af Amer: 42 mL/min — ABNORMAL LOW (ref >60)
Glucose, Bld: 108 mg/dL — ABNORMAL HIGH (ref 70–99)
Potassium: 3.9 mmol/L (ref 3.5–5.1)
Sodium: 134 mmol/L — ABNORMAL LOW (ref 135–145)

## 2019-10-27 LAB — TROPONIN I (HIGH SENSITIVITY): Troponin I (High Sensitivity): 586 ng/L (ref <18)

## 2019-10-27 LAB — T4, FREE: Free T4: 1.27 ng/dL — ABNORMAL HIGH (ref 0.61–1.12)

## 2019-10-27 LAB — MRSA PCR SCREENING: MRSA by PCR: NEGATIVE

## 2019-10-27 LAB — TSH: TSH: 0.117 u[IU]/mL — ABNORMAL LOW (ref 0.350–4.500)

## 2019-10-27 LAB — POCT ACTIVATED CLOTTING TIME: Activated Clotting Time: 406 seconds

## 2019-10-27 MED ORDER — ATORVASTATIN CALCIUM 80 MG PO TABS
80.0000 mg | ORAL_TABLET | Freq: Every day | ORAL | Status: DC
  Administered 2019-10-27: 80 mg via ORAL
  Filled 2019-10-27: qty 1

## 2019-10-27 MED ORDER — LEVOTHYROXINE SODIUM 75 MCG PO TABS
175.0000 ug | ORAL_TABLET | ORAL | Status: DC
  Administered 2019-10-27: 175 ug via ORAL
  Filled 2019-10-27 (×2): qty 1

## 2019-10-27 MED ORDER — LABETALOL HCL 5 MG/ML IV SOLN
10.0000 mg | INTRAVENOUS | Status: AC | PRN
  Filled 2019-10-27: qty 4

## 2019-10-27 MED ORDER — METOPROLOL TARTRATE 12.5 MG HALF TABLET
12.5000 mg | ORAL_TABLET | Freq: Two times a day (BID) | ORAL | Status: DC
  Administered 2019-10-27 – 2019-10-28 (×3): 12.5 mg via ORAL
  Filled 2019-10-27 (×4): qty 1

## 2019-10-27 MED ORDER — DOCUSATE SODIUM 100 MG PO CAPS
200.0000 mg | ORAL_CAPSULE | Freq: Every day | ORAL | Status: DC
  Administered 2019-10-27 – 2019-10-28 (×2): 200 mg via ORAL
  Filled 2019-10-27 (×2): qty 2

## 2019-10-27 MED ORDER — NITROGLYCERIN 0.4 MG SL SUBL: 0.4000 mg | SUBLINGUAL_TABLET | SUBLINGUAL | Status: DC | PRN

## 2019-10-27 MED ORDER — ONDANSETRON HCL 4 MG/2ML IJ SOLN: 4.0000 mg | Freq: Four times a day (QID) | INTRAMUSCULAR | Status: DC | PRN

## 2019-10-27 MED ORDER — HEPARIN SODIUM (PORCINE) 5000 UNIT/ML IJ SOLN
5000.0000 [IU] | Freq: Three times a day (TID) | INTRAMUSCULAR | Status: DC
  Administered 2019-10-27 – 2019-10-28 (×2): 5000 [IU] via SUBCUTANEOUS
  Filled 2019-10-27 (×2): qty 1

## 2019-10-27 MED ORDER — SODIUM CHLORIDE 0.9 % WEIGHT BASED INFUSION
1.0000 mL/kg/h | INTRAVENOUS | Status: AC
  Administered 2019-10-27: 1 mL/kg/h via INTRAVENOUS

## 2019-10-27 MED ORDER — ACETAMINOPHEN 325 MG PO TABS: 650.0000 mg | ORAL_TABLET | ORAL | Status: DC | PRN

## 2019-10-27 MED ORDER — SODIUM CHLORIDE 0.9% FLUSH
3.0000 mL | Freq: Two times a day (BID) | INTRAVENOUS | Status: DC
  Administered 2019-10-27 (×2): 3 mL via INTRAVENOUS

## 2019-10-27 MED ORDER — LISINOPRIL 5 MG PO TABS
5.0000 mg | ORAL_TABLET | Freq: Every day | ORAL | Status: DC
  Administered 2019-10-27 – 2019-10-28 (×2): 5 mg via ORAL
  Filled 2019-10-27 (×2): qty 1

## 2019-10-27 MED ORDER — HYDRALAZINE HCL 20 MG/ML IJ SOLN
10.0000 mg | INTRAMUSCULAR | Status: AC | PRN
  Filled 2019-10-27: qty 1

## 2019-10-27 MED ORDER — ASPIRIN EC 81 MG PO TBEC
81.0000 mg | DELAYED_RELEASE_TABLET | Freq: Every day | ORAL | Status: DC
  Administered 2019-10-27 – 2019-10-28 (×2): 81 mg via ORAL
  Filled 2019-10-27 (×2): qty 1

## 2019-10-27 MED ORDER — SODIUM CHLORIDE 0.9 % IV SOLN: 250.0000 mL | INTRAVENOUS | Status: DC | PRN

## 2019-10-27 MED ORDER — OXYBUTYNIN CHLORIDE ER 10 MG PO TB24
10.0000 mg | ORAL_TABLET | Freq: Every day | ORAL | Status: DC
  Administered 2019-10-27: 10 mg via ORAL
  Filled 2019-10-27 (×4): qty 1

## 2019-10-27 MED ORDER — CHLORHEXIDINE GLUCONATE CLOTH 2 % EX PADS
6.0000 | MEDICATED_PAD | Freq: Every day | CUTANEOUS | Status: DC
  Administered 2019-10-27: 6 via TOPICAL

## 2019-10-27 MED ORDER — TICAGRELOR 90 MG PO TABS
90.0000 mg | ORAL_TABLET | Freq: Two times a day (BID) | ORAL | Status: DC
  Administered 2019-10-27 – 2019-10-28 (×3): 90 mg via ORAL
  Filled 2019-10-27 (×3): qty 1

## 2019-10-27 MED ORDER — LEVOTHYROXINE SODIUM 100 MCG PO TABS
200.0000 ug | ORAL_TABLET | ORAL | Status: DC
  Administered 2019-10-28: 200 ug via ORAL
  Filled 2019-10-27: qty 2

## 2019-10-27 MED ORDER — SODIUM CHLORIDE 0.9% FLUSH: 3.0000 mL | INTRAVENOUS | Status: DC | PRN

## 2019-10-27 MED FILL — Verapamil HCl IV Soln 2.5 MG/ML: INTRAVENOUS | Qty: 2 | Status: AC

## 2019-10-27 NOTE — H&P (Signed)
Cardiology Admission History and Physical:   Patient ID: Abigail [name]; MRN: [id number]; DOB: Mar 30, 1936   Admission date: 10/26/2019  Primary Care Provider:  [name], [place] Primary Cardiologist:  Larae Grooms, MD    Chief Complaint:  Chest pain  History of Present Illness:   Abigail [name] is a 84 y.o. female with a history of hypertension, prior episodes of bradycardia (unremarkable event monitor as an outpatient), lightheadedness, carotid artery disease, hypothyroidism and diverticulitis who presents to the hospital with complaints of chest pain.  She describes the pain as substernal and pressure-like.  There was no radiation of the pain.  She denied any associated nausea, vomiting or diaphoresis.  She did admit to some transient dyspnea along with the chest pain.  She called EMS and her ECG revealed ST elevations in the inferior leads.  She was treated with 324 mg of aspirin, oxygen and 1 sublingual nitroglycerin.  She has had intermittent chest pain for at least a week.  It was noticeable when she would exert herself (such as when picking up her dog).  In the emergency department the patient reported improvement in her chest pain but still had heaviness over the substernal area.  The blood pressure was 147/90 mmHg with a heart rate of 73 bpm.  Respiratory rate was 18.  The chest x-ray suggested some mild central vascular congestion.  The cardiac silhouette was normal.  A repeat ECG showed mild ST elevation in lead III with ST depressions in the lateral leads.  Initial labs were as follows: Sodium 132, potassium 4.3, BUN 28, creatinine 1.5, WBC 8.5, hemoglobin 10.9 and platelets 363.  The patient was taken emergently to the cardiac catheterization lab for a diagnosis of an ST elevation myocardial infarction.    Past Medical History:  Diagnosis Date  . Carotid artery occlusion   . Complication of anesthesia    hard to wake up per pt  . Diverticulitis   . Hypertension   .  Hypothyroid     Past Surgical History:  Procedure Laterality Date  . BACK SURGERY  1980  . ELBOW SURGERY     left  . FRACTURE SURGERY    . SPINE SURGERY       Medications Prior to Admission: Prior to Admission medications   Medication Sig Start Date End Date Taking? Authorizing Provider  aspirin EC 81 MG tablet Take 81 mg by mouth daily.    [provider]  busPIRone (BUSPAR) 5 MG tablet TAKE 1 TABLET BY MOUTH EVERY DAY Patient not taking: Reported on 06/30/2019 04/20/19   [name], FNP  Cranberry 1000 MG CAPS Take 1 capsule by mouth daily at 12 noon.    [provider]  diltiazem (CARDIZEM CD) 180 MG 24 hr capsule TAKE 1 CAPSULE BY MOUTH EVERY DAY 08/25/19   [name], FNP  docusate sodium (COLACE) 100 MG capsule Take 200 mg by mouth daily.     [provider]  ibuprofen (ADVIL,MOTRIN) 200 MG tablet Take 400 mg by mouth every 6 (six) hours as needed for headache or mild pain.    [provider]  levothyroxine (SYNTHROID) 175 MCG tablet Take 1 tablet by mouth every other day in the am opposite of 200 mcg 06/30/19   [name], FNP  levothyroxine (SYNTHROID) 200 MCG tablet Take 1 tablet by mouth every other day opposite of 175 mcg 06/30/19   [name], FNP  lisinopril-hydrochlorothiazide (ZESTORETIC) 20-25 MG tablet TAKE 1 TABLET BY MOUTH EVERY DAY 08/25/19  [name], FNP  oxybutynin (DITROPAN-XL) 10 MG 24 hr tablet TAKE 1 TABLET BY MOUTH EVERYDAY AT BEDTIME 09/19/19   [name], FNP  pravastatin (PRAVACHOL) 20 MG tablet TAKE 1 TABLET BY MOUTH EVERY DAY 03/22/19   [name], FNP     Allergies:    Allergies  Allergen Reactions  . Codeine Nausea And Vomiting  . Norvasc [Amlodipine] Rash and Other (See Comments)    Petechia    Social History:   Social History   Socioeconomic History  . Marital status: Single    Spouse name: Not on file  . Number of children: Not on file  . Years of education: Not on file  . Highest  education level: Not on file  Occupational History  . Not on file  Tobacco Use  . Smoking status: Former Smoker    Quit date: 04/05/1986    Years since quitting: 33.5  . Smokeless tobacco: Never Used  Substance and Sexual Activity  . Alcohol use: No  . Drug use: No  . Sexual activity: Not Currently    Birth control/protection: Post-menopausal  Other Topics Concern  . Not on file  Social History Narrative  . Not on file   Social Determinants of Health   Financial Resource Strain: Low Risk   . Difficulty of Paying Living Expenses: Not hard at all  Food Insecurity: No Food Insecurity  . Worried About Charity fundraiser in the Last Year: Never true  . Ran Out of Food in the Last Year: Never true  Transportation Needs: No Transportation Needs  . Lack of Transportation (Medical): No  . Lack of Transportation (Non-Medical): No  Physical Activity: Inactive  . Days of Exercise per Week: 0 days  . Minutes of Exercise per Session: 0 min  Stress: No Stress Concern Present  . Feeling of Stress : Not at all  Social Connections:   . Frequency of Communication with Friends and Family: Not on file  . Frequency of Social Gatherings with Friends and Family: Not on file  . Attends Religious Services: Not on file  . Active Member of Clubs or Organizations: Not on file  . Attends Archivist Meetings: Not on file  . Marital Status: Not on file  Intimate Partner Violence: Not At Risk  . Fear of Current or Ex-Partner: No  . Emotionally Abused: No  . Physically Abused: No  . Sexually Abused: No     Family History:  The patient's family history includes Hypertension in her mother.     Review of Systems: [y] = yes, [ ]  = no   . General: Weight gain [ ] ; Weight loss [ ] ; Anorexia [ ] ; Fatigue [ ] ; Fever [ ] ; Chills [ ] ; Weakness [ ]   . Cardiac: Chest pain/pressure [Y]; Resting SOB [Y]; Exertional SOB [ ] ; Orthopnea [ ] ; Pedal Edema [ ] ; Palpitations [ ] ; Syncope [ ] ; Presyncope [  ]; Paroxysmal nocturnal dyspnea[ ]   . Pulmonary: Cough [ ] ; Wheezing[ ] ; Hemoptysis[ ] ; Sputum [ ] ; Snoring [ ]   . GI: Vomiting[ ] ; Dysphagia[ ] ; Melena[ ] ; Hematochezia [ ] ; Heartburn[ ] ; Abdominal pain [ ] ; Constipation [ ] ; Diarrhea [ ] ; BRBPR [ ]   . GU: Hematuria[ ] ; Dysuria [ ] ; Nocturia[ ]   . Vascular: Pain in legs with walking [ ] ; Pain in feet with lying flat [ ] ; Non-healing sores [ ] ; Stroke [ ] ; TIA [ ] ; Slurred speech [ ] ;  . Neuro: Headaches[ ] ; Vertigo[ ] ; Seizures[ ] ;  Paresthesias[ ] ;Blurred vision [ ] ; Diplopia [ ] ; Vision changes [ ]   . Ortho/Skin: Arthritis [ ] ; Joint pain [ ] ; Muscle pain [ ] ; Joint swelling [ ] ; Back Pain [ ] ; Rash [ ]   . Psych: Depression[ ] ; Anxiety[ ]   . Heme: Bleeding problems [ ] ; Clotting disorders [ ] ; Anemia [ ]   . Endocrine: Diabetes [ ] ; Thyroid dysfunction[ ]      Physical Exam/Data:   Vitals:   10/26/19 2241 10/26/19 2244 10/26/19 2304  BP: (!) 147/90    Pulse: 73    Resp: 18    Temp: 98.3 F (36.8 C)    TempSrc: Oral    SpO2: 97%  100%  Weight:  65.8 kg   Height:  5\' 6"  (1.676 m)    No intake or output data in the 24 hours ending 10/27/19 0002 Filed Weights   10/26/19 2244  Weight: 65.8 kg   Body mass index is 23.4 kg/m.  General:  Well nourished, well developed, anxious HEENT: normal Lymph: no adenopathy Neck: no JVD Endocrine:  No thryomegaly Vascular: FA pulses 2+ bilaterally without bruits  Cardiac:  normal S1, S2; RRR; 1/6 systolic murmur Lungs:  clear to auscultation bilaterally, no wheezing, rhonchi or rales  Abd: soft, nontender, no hepatomegaly  Ext: no lower extremity edema Musculoskeletal:  No deformities, BUE and BLE strength normal and equal Skin: warm and dry  Neuro:  CNs 2-12 intact, no focal abnormalities noted Psych:  Normal affect    Laboratory Data:  Chemistry Recent Labs  Lab 10/26/19 2242  NA 132*  K 4.3  CL 99  GLUCOSE 124*  BUN 30*  CREATININE 1.60*    No results for input(s): PROT,  ALBUMIN, AST, ALT, ALKPHOS, BILITOT in the last 168 hours. Hematology Recent Labs  Lab 10/26/19 2242 10/26/19 2246  WBC  --  8.5  RBC  --  3.93  HGB 11.2* 10.9*  HCT 33.0* 35.5*  MCV  --  90.3  MCH  --  27.7  MCHC  --  30.7  RDW  --  14.1  PLT  --  363   Cardiac EnzymesNo results for input(s): TROPONINI in the last 168 hours. No results for input(s): TROPIPOC in the last 168 hours.  BNPNo results for input(s): BNP, PROBNP in the last 168 hours.  DDimer No results for input(s): DDIMER in the last 168 hours.  Radiology/Studies:  DG Chest Portable 1 View  Result Date: 10/26/2019 CLINICAL DATA:  STEMI EXAM: PORTABLE CHEST 1 VIEW COMPARISON:  07/08/2018 FINDINGS: Cardiac shadow is within normal limits. Aortic calcifications are seen. The lungs are well aerated bilaterally. Mild central vascular congestion is seen without interstitial edema. No bony abnormality is noted. IMPRESSION: Mild vascular congestion.  No other focal abnormality is seen. Electronically Signed   By: Inez Catalina M.D.   On: 10/26/2019 23:02    Assessment and Plan:   1. ST Elevation myocardial infarction The patient describes intermittent chest pain for the past 1 week. She had chest heaviness today. ECG revealed ST elevations in the inferior leads.  Her risk factors for CAD include: age, post-menopausal state, hypertension & remote history of smoking  - Trend cardiac biomarkers  - Obtain serial ECGs - Aspirin 81 mg daily - Start high dose statins - Check a lipid panel in the morning - Intravenous unfractionated heparin per pharmacy protocol - Obtain a transthoracic echocardiogram to evaluate LV systolic and diastolic function -  Recommend cardiac catheterization to define the coronary anatomy +/-  primary PCI of culprit vessel.   Severity of Illness: The appropriate patient status for this patient is INPATIENT. Inpatient status is judged to be reasonable and necessary in order to provide the required  intensity of service to ensure the patient's safety. The patient's presenting symptoms, physical exam findings, and initial radiographic and laboratory data in the context of their chronic comorbidities is felt to place them at high risk for further clinical deterioration. Furthermore, it is not anticipated that the patient will be medically stable for discharge from the hospital within 2 midnights of admission. The following factors support the patient status of inpatient.   " The patient's presenting symptoms include chest pain. " The physical exam findings include normal cardiac auscultation. " The initial radiographic and laboratory data are worrisome because of abnormal ECG. " The chronic co-morbidities include hypertension.   * I certify that at the point of admission it is my clinical judgment that the patient will require inpatient hospital care spanning beyond 2 midnights from the point of admission due to high intensity of service, high risk for further deterioration and high frequency of surveillance required.*    For questions or updates, please contact [place] Please consult [email or web address] for contact info under Cardiology/STEMI.    Signed, Meade Maw, MD  10/27/2019 12:02 AM

## 2019-10-27 NOTE — Progress Notes (Signed)
CARDIAC REHAB PHASE I   PRE:  Rate/Rhythm: 59 SB    BP: sitting 115/40    SaO2: 97 RA  MODE:  Ambulation: to [facility]   Planned to get pt to bathroom and to recliner (had not been up yet). RN came to tx pt so pt stepped to [facility] with RW (weak) and then to w/c. Followed her to new room and discussed MI, stent, Brilinta, restrictions. Voiced understanding. She lives alone so we will need to access mobility tomorrow. She declines walking today as she is tired. Encouraged her to get to recliner later today. [place], ACSM 10/27/2019 2:49 PM

## 2019-10-27 NOTE — Consult Note (Signed)
Responded to page, pt unavailable, no family present per EMT, staff will page again if further chaplain services needed.  Rev. Eloise Levels Chaplain

## 2019-10-27 NOTE — Plan of Care (Signed)
  Problem: Education: Goal: Knowledge of General Education information will improve Description: Including pain rating scale, medication(s)/side effects and non-pharmacologic comfort measures Outcome: Progressing   Problem: Clinical Measurements: Goal: Cardiovascular complication will be avoided Outcome: Progressing   Problem: Nutrition: Goal: Adequate nutrition will be maintained Outcome: Progressing   Problem: Pain Managment: Goal: General experience of comfort will improve Outcome: Progressing   Problem: Safety: Goal: Ability to remain free from injury will improve Outcome: Progressing

## 2019-10-27 NOTE — Progress Notes (Signed)
  Echocardiogram 2D Echocardiogram has been performed.  Jamail Cullers A Zylen Wenig 10/27/2019, 1:51 PM

## 2019-10-27 NOTE — Progress Notes (Signed)
Chaplain engaged in initial visit with Abigail [name].  During visit, chaplain went over Advanced Directive.  Abigail [name] wants to assign her son "Abigail [name]" as her healthcare agent.  Chaplain was able to have a conversation with Abigail [name] over the phone with Abigail [name] present to discuss what his mother would want if she was unable to make decisions for herself.  Chaplain and Abigail [name] continued to speak about her beliefs and ideals concerning what she would desire if she could not make decisions for herself.  Chaplain assesses that Abigail [name] has had a number of personal experiences with family members that have allowed her to understand the quality of life she desires.    Chaplain left paperwork with Abigail [name] and told her to have me paged when she is ready to complete it.  Chaplain will follow-up.

## 2019-10-27 NOTE — Progress Notes (Signed)
Interventional rounding note: The patient is stable this morning after presenting with an inferior posterior STEMI overnight.  She underwent PCI of the right coronary artery for treatment of the culprit stenosis also noted to have diffuse disease in the mid and distal vessel with suggested medical therapy.  She has had no recurrent angina.  She is clinically stable.  Her vital signs are reviewed this morning.  She is treated with aspirin, ticagrelor, high intensity statin drug, and a beta-blocker.  We will check a 2D echocardiogram and transfer her to telemetry today.  Sherren Mocha 10/27/2019 9:23 AM

## 2019-10-28 LAB — T3, FREE: T3, Free: 2.5 pg/mL (ref 2.0–4.4)

## 2019-10-28 MED ORDER — LISINOPRIL-HYDROCHLOROTHIAZIDE 20-25 MG PO TABS: 0.5000 | ORAL_TABLET | Freq: Every day | ORAL | 1 refills | Status: DC

## 2019-10-28 MED ORDER — NITROGLYCERIN 0.4 MG SL SUBL: 0.4000 mg | SUBLINGUAL_TABLET | SUBLINGUAL | 3 refills | Status: DC | PRN

## 2019-10-28 MED ORDER — ATORVASTATIN CALCIUM 80 MG PO TABS: 80.0000 mg | ORAL_TABLET | Freq: Every day | ORAL | 3 refills | Status: DC

## 2019-10-28 MED ORDER — METOPROLOL TARTRATE 25 MG PO TABS: 12.5000 mg | ORAL_TABLET | Freq: Two times a day (BID) | ORAL | 3 refills | Status: DC

## 2019-10-28 MED ORDER — TICAGRELOR 90 MG PO TABS: 90.0000 mg | ORAL_TABLET | Freq: Two times a day (BID) | ORAL | 3 refills | Status: DC

## 2019-10-28 MED ORDER — LEVOTHYROXINE SODIUM 175 MCG PO TABS: 175.0000 ug | ORAL_TABLET | Freq: Every day | ORAL | 1 refills | Status: DC

## 2019-10-28 MED FILL — NITROGLYCERIN 0.4 MG TAB SL: 0.4 | 8 days supply | Qty: 25 | Fill #0

## 2019-10-28 MED FILL — LISINOPRIL-HCTZ 20-25 MG TA: 20-25 | 90 days supply | Qty: 45 | Fill #0

## 2019-10-28 MED FILL — METOPROLOL TARTRATE 25 MG T: 25 | 90 days supply | Qty: 90 | Fill #0

## 2019-10-28 MED FILL — BRILINTA 90 MG TABLET: 90 | 30 days supply | Qty: 60 | Fill #0

## 2019-10-28 MED FILL — ATORVASTATIN CALCIUM 80 MG: 80 | 90 days supply | Qty: 90 | Fill #0

## 2019-10-28 MED FILL — LEVOTHYROXINE 175 MCG TAB: 175 | 30 days supply | Qty: 30 | Fill #0

## 2019-10-28 NOTE — Discharge Instructions (Addendum)
Radial Site Care  This sheet gives you information about how to care for yourself after your procedure. Your health care provider may also give you more specific instructions. If you have problems or questions, contact your health care provider. What can I expect after the procedure? After the procedure, it is common to have:  Bruising and tenderness at the catheter insertion area. Follow these instructions at home: Medicines  Take over-the-counter and prescription medicines only as told by your health care provider. Insertion site care  Follow instructions from your health care provider about how to take care of your insertion site. Make sure you: ? Wash your hands with soap and water before you change your bandage (dressing). If soap and water are not available, use hand sanitizer. ? Change your dressing as told by your health care provider. ? Leave stitches (sutures), skin glue, or adhesive strips in place. These skin closures may need to stay in place for 2 weeks or longer. If adhesive strip edges start to loosen and curl up, you may trim the loose edges. Do not remove adhesive strips completely unless your health care provider tells you to do that.  Check your insertion site every day for signs of infection. Check for: ? Redness, swelling, or pain. ? Fluid or blood. ? Pus or a bad smell. ? Warmth.  Do not take baths, swim, or use a hot tub until your health care provider approves.  You may shower 24-48 hours after the procedure, or as directed by your health care provider. ? Remove the dressing and gently wash the site with plain soap and water. ? Pat the area dry with a clean towel. ? Do not rub the site. That could cause bleeding.  Do not apply powder or lotion to the site. Activity   For 24 hours after the procedure, or as directed by your health care provider: ? Do not flex or bend the affected arm. ? Do not push or pull heavy objects with the affected arm. ? Do not  drive yourself home from the hospital or clinic. You may drive 24 hours after the procedure unless your health care provider tells you not to. ? Do not operate machinery or power tools.  Do not lift anything that is heavier than 10 lb (4.5 kg), or the limit that you are told, until your health care provider says that it is safe.  Ask your health care provider when it is okay to: ? Return to work or school. ? Resume usual physical activities or sports. ? Resume sexual activity. General instructions  If the catheter site starts to bleed, raise your arm and put firm pressure on the site. If the bleeding does not stop, get help right away. This is a medical emergency.  If you went home on the same day as your procedure, a responsible adult should be with you for the first 24 hours after you arrive home.  Keep all follow-up visits as told by your health care provider. This is important. Contact a health care provider if:  You have a fever.  You have redness, swelling, or yellow drainage around your insertion site. Get help right away if:  You have unusual pain at the radial site.  The catheter insertion area swells very fast.  The insertion area is bleeding, and the bleeding does not stop when you hold steady pressure on the area.  Your arm or hand becomes pale, cool, tingly, or numb. These symptoms may represent a serious problem  that is an emergency. Do not wait to see if the symptoms will go away. Get medical help right away. Call your local emergency services (911 in the [place] Do not drive yourself to the hospital. Summary  After the procedure, it is common to have bruising and tenderness at the site.  Follow instructions from your health care provider about how to take care of your radial site wound. Check the wound every day for signs of infection.  Do not lift anything that is heavier than 10 lb (4.5 kg), or the limit that you are told, until your health care provider says  that it is safe. This information is not intended to replace advice given to you by your health care provider. Make sure you discuss any questions you have with your health care provider. Document Revised: 09/16/2017 Document Reviewed: 09/16/2017 Elsevier Patient Education  2020 [place].   Femoral Site Care This sheet gives you information about how to care for yourself after your procedure. Your health care provider may also give you more specific instructions. If you have problems or questions, contact your health care provider. What can I expect after the procedure? After the procedure, it is common to have:  Bruising that usually fades within 1-2 weeks.  Tenderness at the site. Follow these instructions at home: Wound care  Follow instructions from your health care provider about how to take care of your insertion site. Make sure you: ? Wash your hands with soap and water before you change your bandage (dressing). If soap and water are not available, use hand sanitizer. ? Change your dressing as told by your health care provider. ? Leave stitches (sutures), skin glue, or adhesive strips in place. These skin closures may need to stay in place for 2 weeks or longer. If adhesive strip edges start to loosen and curl up, you may trim the loose edges. Do not remove adhesive strips completely unless your health care provider tells you to do that.  Do not take baths, swim, or use a hot tub until your health care provider approves.  You may shower 24-48 hours after the procedure or as told by your health care provider. ? Gently wash the site with plain soap and water. ? Pat the area dry with a clean towel. ? Do not rub the site. This may cause bleeding.  Do not apply powder or lotion to the site. Keep the site clean and dry.  Check your femoral site every day for signs of infection. Check for: ? Redness, swelling, or pain. ? Fluid or blood. ? Warmth. ? Pus or a bad  smell. Activity  For the first 2-3 days after your procedure, or as long as directed: ? Avoid climbing stairs as much as possible. ? Do not squat.  Do not lift anything that is heavier than 10 lb (4.5 kg), or the limit that you are told, until your health care provider says that it is safe.  Rest as directed. ? Avoid sitting for a long time without moving. Get up to take short walks every 1-2 hours.  Do not drive for 24 hours if you were given a medicine to help you relax (sedative). General instructions  Take over-the-counter and prescription medicines only as told by your health care provider.  Keep all follow-up visits as told by your health care provider. This is important. Contact a health care provider if you have:  A fever or chills.  You have redness, swelling, or pain around your  insertion site. Get help right away if:  The catheter insertion area swells very fast.  You pass out.  You suddenly start to sweat or your skin gets clammy.  The catheter insertion area is bleeding, and the bleeding does not stop when you hold steady pressure on the area.  The area near or just beyond the catheter insertion site becomes pale, cool, tingly, or numb. These symptoms may represent a serious problem that is an emergency. Do not wait to see if the symptoms will go away. Get medical help right away. Call your local emergency services (911 in the [place] Do not drive yourself to the hospital. Summary  After the procedure, it is common to have bruising that usually fades within 1-2 weeks.  Check your femoral site every day for signs of infection.  Do not lift anything that is heavier than 10 lb (4.5 kg), or the limit that you are told, until your health care provider says that it is safe. This information is not intended to replace advice given to you by your health care provider. Make sure you discuss any questions you have with your health care provider. Document Revised:  08/24/2017 Document Reviewed: 08/24/2017 Elsevier Patient Education  2020 [place] about your medication: Brilinta (anti-platelet agent)  Generic Name (Brand): ticagrelor (Brilinta), twice daily medication  PURPOSE: You are taking this medication along with aspirin to lower your chance of having a heart attack, stroke, or blood clots in your heart stent. These can be fatal. Brilinta and aspirin help prevent platelets from sticking together and forming a clot that can block an artery or your stent.   Common SIDE EFFECTS you may experience include: bruising or bleeding more easily, shortness of breath  Do not stop taking BRILINTA without talking to the doctor who prescribes it for you. People who are treated with a stent and stop taking Brilinta too soon, have a higher risk of getting a blood clot in the stent, having a heart attack, or dying. If you stop Brilinta because of bleeding, or for other reasons, your risk of a heart attack or stroke may increase.   Tell all of your doctors and dentists that you are taking Brilinta. They should talk to the doctor who prescribed Brilinta for you before you have any surgery or invasive procedure.   If having any pain, fever, or headache and want over the counter product, avoid NSAIDs or anti-inflammatory agents such as ibuprofen (Advil) due to increased bleed risk. Can use acetaminophen (Tylenol) as recommended on instructions.   Contact your health care provider if you experience: severe or uncontrollable bleeding, pink/red/brown urine, vomiting blood or vomit that looks like "coffee grounds", red or black stools (looks like tar), coughing up blood or blood clots ----------------------------------------------------------------------------------------------------------------------   Heart-Healthy Eating Plan Heart-healthy meal planning includes:  Eating less unhealthy fats.  Eating more healthy fats.  Making other changes in  your diet. Talk with your doctor or a diet specialist (dietitian) to create an eating plan that is right for you. What is my plan? Your doctor may recommend an eating plan that includes:  Total fat: ______% or less of total calories a day.  Saturated fat: ______% or less of total calories a day.  Cholesterol: less than _________mg a day. What are tips for following this plan? Cooking Avoid frying your food. Try to bake, boil, grill, or broil it instead. You can also reduce fat by:  Removing the skin from poultry.  Removing all visible fats from meats.  Steaming vegetables in water or broth. Meal planning   At meals, divide your plate into four equal parts: ? Fill one-half of your plate with vegetables and green salads. ? Fill one-fourth of your plate with whole grains. ? Fill one-fourth of your plate with lean protein foods.  Eat 4-5 servings of vegetables per day. A serving of vegetables is: ? 1 cup of raw or cooked vegetables. ? 2 cups of raw leafy greens.  Eat 4-5 servings of fruit per day. A serving of fruit is: ? 1 medium whole fruit. ?  cup of dried fruit. ?  cup of fresh, frozen, or canned fruit. ?  cup of 100% fruit juice.  Eat more foods that have soluble fiber. These are apples, broccoli, carrots, beans, peas, and barley. Try to get 20-30 g of fiber per day.  Eat 4-5 servings of nuts, legumes, and seeds per week: ? 1 serving of dried beans or legumes equals  cup after being cooked. ? 1 serving of nuts is  cup. ? 1 serving of seeds equals 1 tablespoon. General information  Eat more home-cooked food. Eat less restaurant, buffet, and fast food.  Limit or avoid alcohol.  Limit foods that are high in starch and sugar.  Avoid fried foods.  Lose weight if you are overweight.  Keep track of how much salt (sodium) you eat. This is important if you have high blood pressure. Ask your doctor to tell you more about this.  Try to add vegetarian meals each  week. Fats  Choose healthy fats. These include olive oil and canola oil, flaxseeds, walnuts, almonds, and seeds.  Eat more omega-3 fats. These include salmon, mackerel, sardines, tuna, flaxseed oil, and ground flaxseeds. Try to eat fish at least 2 times each week.  Check food labels. Avoid foods with trans fats or high amounts of saturated fat.  Limit saturated fats. ? These are often found in animal products, such as meats, butter, and cream. ? These are also found in plant foods, such as palm oil, palm kernel oil, and coconut oil.  Avoid foods with partially hydrogenated oils in them. These have trans fats. Examples are stick margarine, some tub margarines, cookies, crackers, and other baked goods. What foods can I eat? Fruits All fresh, canned (in natural juice), or frozen fruits. Vegetables Fresh or frozen vegetables (raw, steamed, roasted, or grilled). Green salads. Grains Most grains. Choose whole wheat and whole grains most of the time. Rice and pasta, including brown rice and pastas made with whole wheat. Meats and other proteins Lean, well-trimmed beef, veal, pork, and lamb. Chicken and [place] without skin. All fish and shellfish. Wild duck, rabbit, pheasant, and venison. Egg whites or low-cholesterol egg substitutes. Dried beans, peas, lentils, and tofu. Seeds and most nuts. Dairy Low-fat or nonfat cheeses, including ricotta and mozzarella. Skim or 1% milk that is liquid, powdered, or evaporated. Buttermilk that is made with low-fat milk. Nonfat or low-fat yogurt. Fats and oils Non-hydrogenated (trans-free) margarines. Vegetable oils, including soybean, sesame, sunflower, olive, peanut, safflower, corn, canola, and cottonseed. Salad dressings or mayonnaise made with a vegetable oil. Beverages Mineral water. Coffee and tea. Diet carbonated beverages. Sweets and desserts Sherbet, gelatin, and fruit ice. Small amounts of dark chocolate. Limit all sweets and desserts. Seasonings  and condiments All seasonings and condiments. The items listed above may not be a complete list of foods and drinks you can eat. Contact a dietitian for more options. What foods  should I avoid? Fruits Canned fruit in heavy syrup. Fruit in cream or butter sauce. Fried fruit. Limit coconut. Vegetables Vegetables cooked in cheese, cream, or butter sauce. Fried vegetables. Grains Breads that are made with saturated or trans fats, oils, or whole milk. Croissants. Sweet rolls. Donuts. High-fat crackers, such as cheese crackers. Meats and other proteins Fatty meats, such as hot dogs, ribs, sausage, bacon, rib-eye roast or steak. High-fat deli meats, such as salami and bologna. Caviar. Domestic duck and goose. Organ meats, such as liver. Dairy Cream, sour cream, cream cheese, and creamed cottage cheese. Whole-milk cheeses. Whole or 2% milk that is liquid, evaporated, or condensed. Whole buttermilk. Cream sauce or high-fat cheese sauce. Yogurt that is made from whole milk. Fats and oils Meat fat, or shortening. Cocoa butter, hydrogenated oils, palm oil, coconut oil, palm kernel oil. Solid fats and shortenings, including bacon fat, salt pork, lard, and butter. Nondairy cream substitutes. Salad dressings with cheese or sour cream. Beverages Regular sodas and juice drinks with added sugar. Sweets and desserts Frosting. Pudding. Cookies. Cakes. Pies. Milk chocolate or white chocolate. Buttered syrups. Full-fat ice cream or ice cream drinks. The items listed above may not be a complete list of foods and drinks to avoid. Contact a dietitian for more information. Summary  Heart-healthy meal planning includes eating less unhealthy fats, eating more healthy fats, and making other changes in your diet.  Eat a balanced diet. This includes fruits and vegetables, low-fat or nonfat dairy, lean protein, nuts and legumes, whole grains, and heart-healthy oils and fats. This information is not intended to replace  advice given to you by your health care provider. Make sure you discuss any questions you have with your health care provider. Document Revised: 10/15/2017 Document Reviewed: 09/18/2017 Elsevier Patient Education  2020 [employer].

## 2019-10-28 NOTE — Progress Notes (Signed)
CARDIAC REHAB PHASE I   PRE:  Rate/Rhythm: 60 SR    BP: sitting 123/55    SaO2: 99 RA  MODE:  Ambulation: 470 ft   POST:  Rate/Rhythm: 83 SR    BP: sitting 133/64     SaO2: 100 RA  Pt able to get out of bed fairly independently. Initially used RW but was able to walk without it last 340 ft. Fairly steady. She lives alone with two dogs so we discussed fall risks and carrying her phone in yard. She has a cane and rollator if needed. Suggest HHPT to ensure safety at home as well. Discussed walking at home, NTG, and CRPII. Will refer to [place]. She will need help setting up the app. She understands Brilinta. Pt is interested in participating in Virtual Cardiac and Pulmonary Rehab. Pt advised that Virtual Cardiac and Pulmonary Rehab is provided at no cost to the patient.  Checklist:  1. Pt has smart device  ie smartphone and/or ipad for downloading an app  Yes 2. Reliable internet/wifi service    Yes 3. Understands how to use their smartphone and navigate within an app. Will need assist Pt verbalized understanding and is in agreement.  [id number]   [place], ACSM 10/28/2019 10:20 AM

## 2019-10-28 NOTE — TOC Initial Note (Signed)
Transition of Care [facility]) - Initial/Assessment Note    Patient Details  Name: Abigail [name] MRN: [id number] Date of Birth: 11/27/1935  Transition of Care [facility]) CM/SW Contact:    Bethena Roys, RN Phone Number: 10/28/2019, 3:00 PM  Clinical Narrative:  Cardiac Rehab worked with patient and stated she felt like patient will benefit from home health Physical Therapy. Case Manager spoke with patient regarding home health services. Patient is agreeable to services and the Medicare.gov list provided to the patient- [facility] was selected and start of care to being within 24-48 hours post transition home. Patient has transportation home. No further needs from Case Manager a this time.                 Expected Discharge Plan: [place] Barriers to Discharge: No Barriers Identified   Patient Goals and CMS Choice Patient states their goals for this hospitalization and ongoing recovery are:: "to return home" CMS Medicare.gov Compare Post Acute Care list provided to:: Patient Choice offered to / list presented to : Patient  Expected Discharge Plan and Services Expected Discharge Plan: [place] In-house Referral: NA Discharge Planning Services: CM Consult Post Acute Care Choice: [place] arrangements for the past 2 months: Mobile Home Expected Discharge Date: 10/28/19                 DME Agency: NA       HH Arranged: PT HH Agency: [facility] Date [facility] Agency Contacted: 10/28/19 Time HH Agency Contacted: 1500 Representative spoke with at [place]: Tommi Rumps  Prior Living Arrangements/Services Living arrangements for the past 2 months: Mobile Home Lives with:: Self Patient language and need for interpreter reviewed:: Yes Do you feel safe going back to the place where you live?: Yes      Need for Family Participation in Patient Care: Yes (Comment) Care giver support system in place?: Yes (comment)   Criminal Activity/Legal  Involvement Pertinent to Current Situation/Hospitalization: No - Comment as needed  Activities of Daily Living Home Assistive Devices/Equipment: Built-in shower seat, Cane (specify quad or straight) ADL Screening (condition at time of admission) Patient's cognitive ability adequate to safely complete daily activities?: Yes Is the patient deaf or have difficulty hearing?: No Does the patient have difficulty seeing, even when wearing glasses/contacts?: No Does the patient have difficulty concentrating, remembering, or making decisions?: No Patient able to express need for assistance with ADLs?: Yes Does the patient have difficulty dressing or bathing?: No Independently performs ADLs?: Yes (appropriate for developmental age) Does the patient have difficulty walking or climbing stairs?: Yes Weakness of Legs: Right(knee) Weakness of Arms/Hands: None  Permission Sought/Granted Permission sought to share information with : Family Supports, Chartered certified accountant granted to share information with : Yes, Verbal Permission Granted     Permission granted to share info w AGENCY: [facility]        Emotional Assessment Appearance:: Appears stated age Attitude/Demeanor/Rapport: Engaged Affect (typically observed): Appropriate Orientation: : Oriented to Place, Oriented to Self, Oriented to Situation, Oriented to  Time Alcohol / Substance Use: Other (comment), Not Applicable Psych Involvement: No (comment)  Admission diagnosis:  STEMI (ST elevation myocardial infarction) ([place]) [I21.3] STEMI involving right coronary artery ([place]) [I21.11] Patient Active Problem List   Diagnosis Date Noted  . STEMI involving right coronary artery ([place]) 10/27/2019  . STEMI (ST elevation myocardial infarction) ([place]) 10/26/2019  . Anxiety 02/09/2019  . Prediabetes 12/29/2018  . Upper respiratory  tract infection 10/21/2018  . Essential hypertension 08/27/2018  . Vitamin D deficiency 06/25/2018  .  Hyperlipidemia 06/25/2018  . Hypothyroidism 06/25/2018  . Benign hypertension with CKD (chronic kidney disease) stage III 06/25/2018  . Chronic kidney disease, stage III (moderate) 06/25/2018  . OAB (overactive bladder) 08/26/2016  . Complete uterine prolapse 06/21/2015  . CAP (community acquired pneumonia) 04/12/2012  . Bacteremia 04/08/2012  . Urinary tract infection without hematuria 04/05/2012  . Nausea and vomiting 04/05/2012  . LLQ abdominal pain 04/05/2012  . Acute renal failure ([place]) 04/05/2012  . Hyponatremia 04/05/2012   PCP:  [name], [place] Pharmacy:   [id number] - [place], [place] [address] Phone: [phone] Fax: [phone]  [facility] [place], [place] - [address] [place] [address] Phone: [phone] Fax: [phone]     Social Determinants of Health (SDOH) Interventions    Readmission Risk Interventions No flowsheet data found.

## 2019-10-28 NOTE — Discharge Summary (Addendum)
Discharge Summary    Patient ID: Abigail [name] MRN: [id number]; DOB: September 17, 1935  Admit date: 10/26/2019 Discharge date: 10/28/2019  Primary Care Provider: [name], FNP  Primary Cardiologist: Larae Grooms, MD  Primary Electrophysiologist:  None   Discharge Diagnoses    Principal Problem:   STEMI involving right coronary artery [facility]) Active Problems:   Hyperlipidemia   Hypothyroidism   Essential hypertension   Prediabetes   STEMI (ST elevation myocardial infarction) [facility])    Diagnostic Studies/Procedures    Left heart cath 10/26/19:  A drug-eluting stent was successfully placed using a SYNERGY XD 2.75X24.  Post intervention, there is a 0% residual stenosis.   1.  Severe single-vessel coronary artery disease with a proximal RCA culprit lesion, treated successfully with primary PCI using a 2.75 x 24 mm Synergy DES 2.  Mild diffuse nonobstructive disease involving the LAD and left circumflex without any significant stenoses 3.  Normal LVEDP 4.  Moderately severe diffuse residual stenosis in the mid and distal RCA, favor initial medical therapy. _____________   Echo 10/27/19: 1. Left ventricular ejection fraction, by estimation, is 60 to 65%. The  left ventricle has normal function. The left ventricle has no regional  wall motion abnormalities. Left ventricular diastolic parameters were  normal.  2. Right ventricular systolic function is normal. The right ventricular  size is normal.  3. Left atrial size was mildly dilated.  4. The mitral valve is normal in structure and function. No evidence of  mitral valve regurgitation. No evidence of mitral stenosis.  5. The aortic valve is normal in structure and function. Aortic valve  regurgitation is not visualized. No aortic stenosis is present.  6. The inferior vena cava is normal in size with greater than 50%  respiratory variability, suggesting right atrial pressure of 3 mmHg.   Conclusion(s)/Recommendation(s): No  evidence of valvular vegetations on  this transthoracic echocardiogram. Would recommend a transesophageal  echocardiogram to exclude infective endocarditis if clinically indicated.    History of Present Illness     Abigail [name] is a 84 y.o. female with  a history of hypertension, prior episodes of bradycardia (unremarkable event monitor as an outpatient), lightheadedness, carotid artery disease, hypothyroidism and diverticulitis who presented to the hospital 10/27/19 with complaints of chest pain.  She described the pain as substernal and pressure-like.  There was no radiation of the pain.  She denied any associated nausea, vomiting or diaphoresis.  She did admit to some transient dyspnea along with the chest pain.  She called EMS and her ECG revealed ST elevations in the inferior leads.  She was treated with 324 mg of aspirin, oxygen and 1 sublingual nitroglycerin.  She has had intermittent chest pain for at least a week.  It was noticeable when she would exert herself (such as when picking up her dog).  In the emergency department the patient reported improvement in her chest pain but still had heaviness over the substernal area.  The blood pressure was 147/90 mmHg with a heart rate of 73 bpm.  Respiratory rate was 18.  The chest x-ray suggested some mild central vascular congestion.  The cardiac silhouette was normal.  A repeat ECG showed mild ST elevation in lead III with ST depressions in the lateral leads.  Initial labs were as follows: Sodium 132, potassium 4.3, BUN 28, creatinine 1.5, WBC 8.5, hemoglobin 10.9 and platelets 363.  The patient was taken emergently to the cardiac catheterization lab for a diagnosis of an ST elevation myocardial infarction.  Hospital Course     Consultants: none  Inferior STEMI Patient was taken emergently to the cath lab. LHC revealed severe single vessel disease with proximal RCA as the culprit lesion with 95% stenosis successfully treated with DES. She  also had 75% stenosis followed by 80% stenosis in the distal vessel with left to right collateral. She tolerated the procedure well. Echocardiogram revealed normal EF with normal diastolic function. She was placed on ASA and brilinta, BB, and statin.  We held her home cardizem and started low dose lopressor.   If the remaining disease in the RCA causes recurrent symptoms, may consider intervening on the mid and distal RCA.    Hypertension - will reduce home lisinopril-HCTZ to half tablet - started lopressor 12.5 mg BID   Hyperlipidemia - 10/26/2019: Cholesterol 190; HDL 40; LDL Cholesterol 110; Triglycerides 198; VLDL 40 - switched pravachol to lipitor 80 mg - repeat lipids in 6 weeks   Hypothyroidism Patient was alternating 175 mcg and 200 mcg synthroid. TSH low and T4 elevated. We switched her to 175 mcg synthroid daily. Follow up with PCP at first available.    Prediabetes A1c 5.8% - stable   Pt seen and examined by Dr. Burt Knack and felt stable for discharge. Follow up has been arranged.   Did the patient have an acute coronary syndrome (MI, NSTEMI, STEMI, etc) this admission?:  Yes                               AHA/ACC Clinical Performance & Quality Measures: 1. Aspirin prescribed? - Yes 2. ADP Receptor Inhibitor (Plavix/Clopidogrel, Brilinta/Ticagrelor or Effient/Prasugrel) prescribed (includes medically managed patients)? - Yes 3. Beta Blocker prescribed? - Yes 4. High Intensity Statin (Lipitor 40-80mg  or Crestor 20-40mg ) prescribed? - Yes 5. EF assessed during THIS hospitalization? - Yes 6. For EF <40%, was ACEI/ARB prescribed? - Yes 7. For EF <40%, Aldosterone Antagonist (Spironolactone or Eplerenone) prescribed? - Not Applicable (EF >/= 16%) 8. Cardiac Rehab Phase II ordered (Included Medically managed Patients)? - Yes   _____________  Discharge Vitals Blood pressure (!) 123/55, pulse (!) 58, temperature 98 F (36.7 C), temperature source Oral, resp. rate 19, height 5'  6" (1.676 m), weight 67.9 kg, SpO2 95 %.  Filed Weights   10/26/19 2244 10/27/19 0600 10/28/19 0538  Weight: 65.8 kg 65.8 kg 67.9 kg    Labs & Radiologic Studies    CBC Recent Labs    10/26/19 2246 10/26/19 2246 10/26/19 2322 10/27/19 0513  WBC 8.5  --   --  8.6  NEUTROABS 5.5  --   --   --   HGB 10.9*   < > 10.5* 10.0*  HCT 35.5*   < > 31.0* 31.2*  MCV 90.3  --   --  87.4  PLT 363  --   --  347   < > = values in this interval not displayed.   Basic Metabolic Panel Recent Labs    10/26/19 2246 10/26/19 2246 10/26/19 2322 10/27/19 0513  NA 132*   < > 132* 134*  K 4.3   < > 4.2 3.9  CL 100   < > 99 102  CO2 21*  --   --  23  GLUCOSE 124*   < > 125* 108*  BUN 28*   < > 28* 24*  CREATININE 1.51*   < > 1.40* 1.20*  CALCIUM 9.1  --   --  8.7*   < > =  values in this interval not displayed.   Liver Function Tests Recent Labs    10/26/19 2246  AST 18  ALT 14  ALKPHOS 79  BILITOT 0.3  PROT 7.1  ALBUMIN 3.6   No results for input(s): LIPASE, AMYLASE in the last 72 hours. High Sensitivity Troponin:   Recent Labs  Lab 10/27/19 0513  TROPONINIHS 586*    BNP Invalid input(s): POCBNP D-Dimer No results for input(s): DDIMER in the last 72 hours. Hemoglobin A1C Recent Labs    10/26/19 2246  HGBA1C 5.8*   Fasting Lipid Panel Recent Labs    10/26/19 2246  CHOL 190  HDL 40*  LDLCALC 110*  TRIG 198*  CHOLHDL 4.8   Thyroid Function Tests Recent Labs    10/27/19 0513  TSH 0.117*  T3FREE 2.5   _____________  CARDIAC CATHETERIZATION  Result Date: 10/27/2019  A drug-eluting stent was successfully placed using a SYNERGY XD 2.75X24.  Post intervention, there is a 0% residual stenosis.  1.  Severe single-vessel coronary artery disease with a proximal RCA culprit lesion, treated successfully with primary PCI using a 2.75 x 24 mm Synergy DES 2.  Mild diffuse nonobstructive disease involving the LAD and left circumflex without any significant stenoses 3.   Normal LVEDP 4.  Moderately severe diffuse residual stenosis in the mid and distal RCA, favor initial medical therapy.   DG Chest Portable 1 View  Result Date: 10/26/2019 CLINICAL DATA:  STEMI EXAM: PORTABLE CHEST 1 VIEW COMPARISON:  07/08/2018 FINDINGS: Cardiac shadow is within normal limits. Aortic calcifications are seen. The lungs are well aerated bilaterally. Mild central vascular congestion is seen without interstitial edema. No bony abnormality is noted. IMPRESSION: Mild vascular congestion.  No other focal abnormality is seen. Electronically Signed   By: Inez Catalina M.D.   On: 10/26/2019 23:02   ECHOCARDIOGRAM COMPLETE  Result Date: 10/27/2019    ECHOCARDIOGRAM REPORT   Patient Name:   Abigail [name] Date of Exam: 10/27/2019 Medical Rec #:  [id number]    Height:       66.0 in Accession #:    [id number]   Weight:       145.0 lb Date of Birth:  Dec 05, 1935    BSA:          1.744 m Patient Age:    84 years     BP:           144/41 mmHg Patient Gender: F            HR:           50 bpm. Exam Location:  Inpatient Procedure: 2D Echo Indications:    Chest Pain 786.50 / R07.9  History:        Patient has no prior history of Echocardiogram examinations.                 STEMI; Risk Factors:Hypertension. Acute renal failure                 Bacteremia                 CKD.  Sonographer:    Vikki Ports Turrentine Referring Phys: [place]  1. Left ventricular ejection fraction, by estimation, is 60 to 65%. The left ventricle has normal function. The left ventricle has no regional wall motion abnormalities. Left ventricular diastolic parameters were normal.  2. Right ventricular systolic function is normal. The right ventricular size is normal.  3. Left atrial size was mildly  dilated.  4. The mitral valve is normal in structure and function. No evidence of mitral valve regurgitation. No evidence of mitral stenosis.  5. The aortic valve is normal in structure and function. Aortic valve regurgitation is  not visualized. No aortic stenosis is present.  6. The inferior vena cava is normal in size with greater than 50% respiratory variability, suggesting right atrial pressure of 3 mmHg. Conclusion(s)/Recommendation(s): No evidence of valvular vegetations on this transthoracic echocardiogram. Would recommend a transesophageal echocardiogram to exclude infective endocarditis if clinically indicated. FINDINGS  Left Ventricle: Left ventricular ejection fraction, by estimation, is 60 to 65%. The left ventricle has normal function. The left ventricle has no regional wall motion abnormalities. The left ventricular internal cavity size was normal in size. There is  no left ventricular hypertrophy. Left ventricular diastolic parameters were normal. Right Ventricle: The right ventricular size is normal. No increase in right ventricular wall thickness. Right ventricular systolic function is normal. Left Atrium: Left atrial size was mildly dilated. Right Atrium: Right atrial size was normal in size. Pericardium: There is no evidence of pericardial effusion. Mitral Valve: The mitral valve is normal in structure and function. Normal mobility of the mitral valve leaflets. No evidence of mitral valve regurgitation. No evidence of mitral valve stenosis. Tricuspid Valve: The tricuspid valve is normal in structure. Tricuspid valve regurgitation is not demonstrated. No evidence of tricuspid stenosis. Aortic Valve: The aortic valve is normal in structure and function. Aortic valve regurgitation is not visualized. No aortic stenosis is present. Aortic valve mean gradient measures 6.0 mmHg. Aortic valve peak gradient measures 11.3 mmHg. Aortic valve area, by VTI measures 2.53 cm. Pulmonic Valve: The pulmonic valve was normal in structure. Pulmonic valve regurgitation is not visualized. No evidence of pulmonic stenosis. Aorta: The aortic root is normal in size and structure. Venous: The inferior vena cava is normal in size with greater than  50% respiratory variability, suggesting right atrial pressure of 3 mmHg. IAS/Shunts: No atrial level shunt detected by color flow Doppler.  LEFT VENTRICLE PLAX 2D LVIDd:         4.67 cm  Diastology LVIDs:         3.23 cm  LV e' lateral:   10.80 cm/s LV PW:         1.08 cm  LV E/e' lateral: 5.6 LV IVS:        1.07 cm  LV e' medial:    6.85 cm/s LVOT diam:     2.00 cm  LV E/e' medial:  8.8 LV SV:         74 LV SV Index:   42 LVOT Area:     3.14 cm  RIGHT VENTRICLE RV S prime:     13.20 cm/s TAPSE (M-mode): 2.6 cm LEFT ATRIUM             Index       RIGHT ATRIUM           Index LA diam:        3.80 cm 2.18 cm/m  RA Area:     17.60 cm LA Vol (A2C):   57.1 ml 32.73 ml/m RA Volume:   51.70 ml  29.64 ml/m LA Vol (A4C):   55.6 ml 31.87 ml/m LA Biplane Vol: 57.2 ml 32.79 ml/m  AORTIC VALVE AV Area (Vmax):    2.34 cm AV Area (Vmean):   2.23 cm AV Area (VTI):     2.53 cm AV Vmax:  168.00 cm/s AV Vmean:          117.000 cm/s AV VTI:            0.291 m AV Peak Grad:      11.3 mmHg AV Mean Grad:      6.0 mmHg LVOT Vmax:         125.00 cm/s LVOT Vmean:        83.000 cm/s LVOT VTI:          0.234 m LVOT/AV VTI ratio: 0.80  AORTA Ao Root diam: 2.80 cm MITRAL VALVE MV Area (PHT): 1.51 cm    SHUNTS MV Decel Time: 504 msec    Systemic VTI:  0.23 m MV E velocity: 60.00 cm/s  Systemic Diam: 2.00 cm MV A velocity: 66.00 cm/s MV E/A ratio:  0.91 Mihai Croitoru MD Electronically signed by Sanda Klein MD Signature Date/Time: 10/27/2019/3:18:04 PM    Final    Disposition   Pt is being discharged home today in good condition.  Follow-up Plans & Appointments    Follow-up Information    Liliane Shi, PA-C Follow up on 11/08/2019.   Specialties: Cardiology, Physician Assistant Why: 3:15 pm for [facility] Contact information: [address] [place] [address] [phone]          Discharge Instructions    Amb Referral to Cardiac Rehabilitation   Complete by: As directed    Diagnosis:   Coronary Stents STEMI PTCA     After initial evaluation and assessments completed: Virtual Based Care may be provided alone or in conjunction with Phase 2 Cardiac Rehab based on patient barriers.: Yes   Diet - low sodium heart healthy   Complete by: As directed    Discharge instructions   Complete by: As directed    No driving for 1 week. No lifting over 5 lbs for 1 week. No sexual activity for 1 week. Keep procedure site clean & dry. If you notice increased pain, swelling, bleeding or pus, call/return!  You may shower, but no soaking baths/hot tubs/pools for 1 week.   Increase activity slowly   Complete by: As directed       Discharge Medications   Allergies as of 10/28/2019      Reactions   Codeine Nausea And Vomiting   Norvasc [amlodipine] Rash, Other (See Comments)   Petechia      Medication List    STOP taking these medications   busPIRone 5 MG tablet Commonly known as: BUSPAR   diltiazem 180 MG 24 hr capsule Commonly known as: CARDIZEM CD   ibuprofen 200 MG tablet Commonly known as: ADVIL   pravastatin 20 MG tablet Commonly known as: PRAVACHOL     TAKE these medications   aspirin EC 81 MG tablet Take 81 mg by mouth daily.   atorvastatin 80 MG tablet Commonly known as: LIPITOR Take 1 tablet (80 mg total) by mouth daily at 6 PM.   Cranberry 1000 MG Caps Take 1 capsule by mouth daily at 12 noon.   docusate sodium 100 MG capsule Commonly known as: COLACE Take 200 mg by mouth daily.   levothyroxine 175 MCG tablet Commonly known as: SYNTHROID Take 1 tablet (175 mcg total) by mouth daily before breakfast. Take 1 tablet by mouth every day in the am What changed:   how much to take  how to take this  when to take this  additional instructions  Another medication with the same name was removed. Continue taking this  medication, and follow the directions you see here.   lisinopril-hydrochlorothiazide 20-25 MG tablet Commonly known as: ZESTORETIC Take  0.5 tablets by mouth daily. What changed: how much to take   metoprolol tartrate 25 MG tablet Commonly known as: LOPRESSOR Take 0.5 tablets (12.5 mg total) by mouth 2 (two) times daily.   nitroGLYCERIN 0.4 MG SL tablet Commonly known as: NITROSTAT Place 1 tablet (0.4 mg total) under the tongue every 5 (five) minutes x 3 doses as needed for chest pain.   oxybutynin 10 MG 24 hr tablet Commonly known as: DITROPAN-XL TAKE 1 TABLET BY MOUTH EVERYDAY AT BEDTIME   ticagrelor 90 MG Tabs tablet Commonly known as: BRILINTA Take 1 tablet (90 mg total) by mouth 2 (two) times daily.          Outstanding Labs/Studies   none  Duration of Discharge Encounter   Greater than 30 minutes including physician time.  Signed, [place], PA 10/28/2019, 10:32 AM  Patient seen, examined. Available data reviewed. Agree with findings, assessment, and plan as outlined by Doreene Adas, PA-C.  On my exam today, the patient is alert, oriented, in no distress.  HEENT is normal, JVP is normal, lungs are clear, heart is regular rate and rhythm with a 2/6 systolic ejection murmur at the right upper sternal border, abdomen is soft and nontender, extremities have no edema.  Telemetry is reviewed and shows normal sinus rhythm with no significant arrhythmia.  The patient's echocardiogram is reviewed and shows normal LV function with an LVEF of 60%.  She has done very well after presenting with an inferior MI.  She understands she has residual disease in the right coronary artery and will be treated medically.  We reviewed her medications with her today and this includes dual antiplatelet therapy with aspirin and ticagrelor, high intensity statin drug, and a beta-blocker.  Her home ACE inhibitor has been resumed at a lower dose.  I discussed plans with her son over the telephone.  She is medically stable for discharge later today.  Sherren Mocha, M.D. 10/28/2019 11:25 AM

## 2019-10-31 ENCOUNTER — Telehealth: Payer: Self-pay

## 2019-10-31 ENCOUNTER — Telehealth: Payer: Self-pay | Admitting: Interventional Cardiology

## 2019-10-31 DIAGNOSIS — Z87891 Personal history of nicotine dependence: Secondary | ICD-10-CM

## 2019-10-31 DIAGNOSIS — E039 Hypothyroidism, unspecified: Secondary | ICD-10-CM

## 2019-10-31 DIAGNOSIS — E785 Hyperlipidemia, unspecified: Secondary | ICD-10-CM

## 2019-10-31 DIAGNOSIS — Z48812 Encounter for surgical aftercare following surgery on the circulatory system: Secondary | ICD-10-CM | POA: Diagnosis not present

## 2019-10-31 DIAGNOSIS — R7303 Prediabetes: Secondary | ICD-10-CM

## 2019-10-31 DIAGNOSIS — Z9181 History of falling: Secondary | ICD-10-CM | POA: Diagnosis not present

## 2019-10-31 DIAGNOSIS — I2510 Atherosclerotic heart disease of native coronary artery without angina pectoris: Secondary | ICD-10-CM | POA: Diagnosis not present

## 2019-10-31 DIAGNOSIS — I119 Hypertensive heart disease without heart failure: Secondary | ICD-10-CM | POA: Diagnosis not present

## 2019-10-31 DIAGNOSIS — Z7902 Long term (current) use of antithrombotics/antiplatelets: Secondary | ICD-10-CM

## 2019-10-31 DIAGNOSIS — R001 Bradycardia, unspecified: Secondary | ICD-10-CM

## 2019-10-31 DIAGNOSIS — Z7982 Long term (current) use of aspirin: Secondary | ICD-10-CM | POA: Diagnosis not present

## 2019-10-31 DIAGNOSIS — I2111 ST elevation (STEMI) myocardial infarction involving right coronary artery: Secondary | ICD-10-CM | POA: Diagnosis not present

## 2019-10-31 DIAGNOSIS — Z955 Presence of coronary angioplasty implant and graft: Secondary | ICD-10-CM | POA: Diagnosis not present

## 2019-10-31 DIAGNOSIS — I700 Atherosclerosis of aorta: Secondary | ICD-10-CM

## 2019-10-31 NOTE — Telephone Encounter (Signed)
Patient was advised from her PCP to let her cardiologist know that her BP reading today was 170/84.

## 2019-10-31 NOTE — Telephone Encounter (Signed)
Started to speak to patient regarding her BP but we were disconnected. Attempted to contact patient again and there was no answer. Left message for patient to call back.

## 2019-10-31 NOTE — Telephone Encounter (Signed)
Transition Care Management Follow-up Telephone Call  Date of discharge and from where: [facility] 10/28/19  How have you been since you were released from the hospital? she's been ok  Any questions or concerns? none  Items Reviewed:  Did the pt receive and understand the discharge instructions provided? yes  Medications obtained and verified? Yes her meds have changed  Any new allergies since your discharge? no  Dietary orders reviewed? Low sodium diet   Do you have support at home? Yes her friend comes to help her out 2 times a day   Other (ie: DME, Home Health, etc) Physical therapist   Functional Questionnaire: (I = Independent and D = Dependent) ADL's: I  Bathing/Dressing- I   Meal Prep-I   Eating- I  Maintaining continence- I  Transferring/Ambulation- I  Managing Meds- I   Follow up appointments reviewed:    PCP Hospital f/u appt confirmed? Scheduled to see [name] DNP-FNP-BC on 11/01/19 @ 4:30pm  Specialist Hospital f/u appt confirmed?Scheduled to see cardiologist Crista Curb PA on 11/08/19 @ 3:15pm  Are transportation arrangements needed YES  If their condition worsens, is the pt aware to call  their PCP or go to the ED? YES  Was the patient provided with contact information for the PCP's office or ED? YES  Was the pt encouraged to call back with questions or concerns? YES

## 2019-10-31 NOTE — Telephone Encounter (Signed)
Left message to call back  

## 2019-11-01 ENCOUNTER — Telehealth (INDEPENDENT_AMBULATORY_CARE_PROVIDER_SITE_OTHER): Payer: PPO | Admitting: Nurse Practitioner

## 2019-11-01 ENCOUNTER — Telehealth (HOSPITAL_COMMUNITY): Payer: Self-pay

## 2019-11-01 ENCOUNTER — Other Ambulatory Visit: Payer: Self-pay

## 2019-11-01 ENCOUNTER — Encounter: Payer: Self-pay | Admitting: Nurse Practitioner

## 2019-11-01 VITALS — BP 132/62 | HR 46 | Temp 100.1°F

## 2019-11-01 DIAGNOSIS — I2111 ST elevation (STEMI) myocardial infarction involving right coronary artery: Secondary | ICD-10-CM | POA: Diagnosis not present

## 2019-11-01 DIAGNOSIS — E039 Hypothyroidism, unspecified: Secondary | ICD-10-CM | POA: Diagnosis not present

## 2019-11-01 NOTE — Telephone Encounter (Signed)
Pt insurance is active and benefits verified through [employer] adv Co-pay $15, DED 0/0 met, out of pocket $3,400/0 met, co-insurance 0%. no pre-authorization required, REF# [id number]  Will contact patient to see if she is interested in the Cardiac Rehab Program. If interested, patient will need to complete follow up appt. Once completed, patient will be contacted for scheduling upon review by the RN Navigator.

## 2019-11-01 NOTE — Progress Notes (Deleted)
This visit occurred during the SARS-CoV-2 public health emergency.  Safety protocols were in place, including screening questions prior to the visit, additional usage of staff PPE, and extensive cleaning of exam room while observing appropriate contact time as indicated for disinfecting solutions.  Subjective:     Patient ID: Abigail [name] , female    DOB: 1935/10/01 , 84 y.o.   MRN: [id number]   Chief Complaint  Patient presents with  . Hospitalization Follow-up    HPI  She was having chest pressure on 10/27/2019 occurred off and on for 3-4 days and called EMS due to thought was COVID, she would take baby aspirin and would help.  When EKG placed was having a heart attack. She had been to Dr. Einar Gip years ago with some caths  She is not doing anything such as housework, she has a friend coming to take the dog out.   She is scheduled to see Cardiology on March 16th.  She had a PT visit, he is going to set her up with MSW through [facility]. She is interested in having her Covid vaccine.      Past Medical History:  Diagnosis Date  . Carotid artery occlusion   . Complication of anesthesia    hard to wake up per pt  . Diverticulitis   . Hypertension   . Hypothyroid      Family History  Problem Relation Age of Onset  . Hypertension Mother      Current Outpatient Medications:  .  aspirin EC 81 MG tablet, Take 81 mg by mouth daily., Disp: , Rfl:  .  atorvastatin (LIPITOR) 80 MG tablet, Take 1 tablet (80 mg total) by mouth daily at 6 PM., Disp: 90 tablet, Rfl: 3 .  Cranberry 1000 MG CAPS, Take 1 capsule by mouth daily at 12 noon., Disp: , Rfl:  .  docusate sodium (COLACE) 100 MG capsule, Take 200 mg by mouth daily. , Disp: , Rfl:  .  levothyroxine (SYNTHROID) 175 MCG tablet, Take 1 tablet (175 mcg total) by mouth daily before breakfast. Take 1 tablet by mouth every day in the am, Disp: 45 tablet, Rfl: 1 .  lisinopril-hydrochlorothiazide (ZESTORETIC) 20-25 MG tablet, Take 0.5 tablets by  mouth daily., Disp: 90 tablet, Rfl: 1 .  metoprolol tartrate (LOPRESSOR) 25 MG tablet, Take 0.5 tablets (12.5 mg total) by mouth 2 (two) times daily., Disp: 180 tablet, Rfl: 3 .  nitroGLYCERIN (NITROSTAT) 0.4 MG SL tablet, Place 1 tablet (0.4 mg total) under the tongue every 5 (five) minutes x 3 doses as needed for chest pain., Disp: 25 tablet, Rfl: 3 .  oxybutynin (DITROPAN-XL) 10 MG 24 hr tablet, TAKE 1 TABLET BY MOUTH EVERYDAY AT BEDTIME, Disp: 90 tablet, Rfl: 1 .  ticagrelor (BRILINTA) 90 MG TABS tablet, Take 1 tablet (90 mg total) by mouth 2 (two) times daily., Disp: 180 tablet, Rfl: 3  Current Facility-Administered Medications:  .  Ring Pessary/Support MISC 1 each, 1 each, Does not apply, Once, Anyanwu, Sallyanne Havers, MD   Allergies  Allergen Reactions  . Codeine Nausea And Vomiting  . Norvasc [Amlodipine] Rash and Other (See Comments)    Petechia     Review of Systems   Today's Vitals   11/01/19 1401  BP: 132/62  Pulse: (!) 46  Temp: 100.1 F (37.8 C)  TempSrc: Oral  PainSc: 0-No pain   There is no height or weight on file to calculate BMI.   Objective:  Physical Exam  Assessment And Plan:     There are no diagnoses linked to this encounter.     ***  [name], FNP    THE PATIENT IS ENCOURAGED TO PRACTICE SOCIAL DISTANCING DUE TO THE COVID-19 PANDEMIC.

## 2019-11-02 NOTE — Telephone Encounter (Signed)
Called and spoke to patient. She states that her BP was elevated at her PCP's office the other day-170/84. She states that this was before she had her meds. Patient states that she is taking lisinopril-hctz 20-25 mg--1/2 tablet QD and metoprolol 12.5 mg BID. Patient denies having any Sx. Patient does not regularly check BP at home. Instructed patient to check BP 1-2 hours after meds and record BP and HR. Patient has an appointment with Richardson Dopp, PA on 3/16. I have asked her to bring her BP readings to this appt. We discussed the importance of avoiding salt in her diet. Instructed patient to let us know if she develops any Sx. She verbalized understanding and thanked me for the call.

## 2019-11-02 NOTE — Telephone Encounter (Signed)
Agree.  Call if BP readings are consistently >160/100. Bring BP cuff to appt with BP readings. Richardson Dopp, PA-C    11/02/2019 5:14 PM

## 2019-11-02 NOTE — Telephone Encounter (Signed)
Detailed message left on VM per DPR of recommendations below.

## 2019-11-08 ENCOUNTER — Other Ambulatory Visit: Payer: Self-pay

## 2019-11-08 ENCOUNTER — Ambulatory Visit: Payer: PPO | Admitting: Family Medicine

## 2019-11-08 ENCOUNTER — Encounter: Payer: Self-pay | Admitting: Physician Assistant

## 2019-11-08 VITALS — BP 130/52 | HR 75 | Ht 66.0 in | Wt 148.8 lb

## 2019-11-08 DIAGNOSIS — I10 Essential (primary) hypertension: Secondary | ICD-10-CM | POA: Diagnosis not present

## 2019-11-08 DIAGNOSIS — R7303 Prediabetes: Secondary | ICD-10-CM | POA: Diagnosis not present

## 2019-11-08 DIAGNOSIS — I2111 ST elevation (STEMI) myocardial infarction involving right coronary artery: Secondary | ICD-10-CM

## 2019-11-08 DIAGNOSIS — E782 Mixed hyperlipidemia: Secondary | ICD-10-CM | POA: Diagnosis not present

## 2019-11-08 NOTE — Patient Instructions (Addendum)
  Medication Instructions:   Your physician recommends that you continue on your current medications as directed. Please refer to the Current Medication list given to you today.  *If you need a refill on your cardiac medications before your next appointment, please call your pharmacy*  Lab Work:  None ordered today  Testing/Procedures:  None ordered today  Follow-Up: At [facility], you and your health needs are our priority.  As part of our continuing mission to provide you with exceptional heart care, we have created designated Provider Care Teams.  These Care Teams include your primary Cardiologist (physician) and Advanced Practice Providers (APPs -  Physician Assistants and Nurse Practitioners) who all work together to provide you with the care you need, when you need it.  We recommend signing up for the patient portal called "MyChart".  Sign up information is provided on this After Visit Summary.  MyChart is used to connect with patients for Virtual Visits (Telemedicine).  Patients are able to view lab/test results, encounter notes, upcoming appointments, etc.  Non-urgent messages can be sent to your provider as well.   To learn more about what you can do with MyChart, go to [email or web address].    Your next appointment:    On 01/09/20 at 9:20AM with Dr. Irish Lack, make sure you are fasting so we can check your cholesterol.

## 2019-11-08 NOTE — Progress Notes (Addendum)
Cardiology Office Note  Date: 11/10/2019   ID: Abigail [name], DOB 11/13/1935, MRN [id number]  PCP:  [name], FNP  Cardiologist:  Larae Grooms, MD Electrophysiologist:  None   Chief Complaint: CAD, HLD, HTN, S/P STEMI 10/27/2019  History of Present Illness: Abigail [name] is a 84 y.o. female with a history of HLD, hypothyroidism, HTN, prediabetes, STEMI.    She presented to the hospital on 10/27/2019 with complaints of chest pain described as substernal and pressure-like.  She denied any radiation, nausea, vomiting, or diaphoresis.  She did admit to transient dyspnea along with the chest pain.  EMS was called and her EKG revealed ST elevation in inferior leads.  She was given 324 mg of aspirin, oxygen, and sublingual nitroglycerin.  She had been experiencing intermittent chest pain for 1 week leading up to the event.  In the emergency room stated her chest pain had improved but still had some heaviness in the substernal area.  Repeat EKG in the emergency room showed mild ST elevation in lead III with ST depression in lateral leads.  She was taken emergently to the catheterization lab with diagnosis of ST elevation MI.  Left heart cath revealed severe single-vessel disease in proximal RCA with a 95% stenosis treated with DES.  Also 75% stenosis followed by 80% stenosis in the distal vessel with left-to-right collaterals.  Echocardiogram showed normal EF with normal diastolic fx.  She was placed on aspirin and Brilinta, beta-blocker, and statin.  Dr. Burt Knack commented on remaining disease in RCA recommended medical treatment stating he may consider further intervening on the distal RCA later.  Patient states she has been doing well since discharge from hospital status post anterior STEMI.  States she feels a little weak and tired. Tolerating all other new medications.  She denies any progressive anginal or exertional symptoms.  Denies any bleeding issues after starting aspirin and Brilinta.   Right groin access is clean and dry without any signs of infection, hematoma or pseudocyst.  With 2+ femoral pulses.  EKG today shows normal sinus rhythm with T wave inversion noted in 2, 3, aVF along with V4 V5 currently.   Past Medical History:  Diagnosis Date  . Carotid artery occlusion   . Complication of anesthesia    hard to wake up per pt  . Diverticulitis   . Hypertension   . Hypothyroid     Past Surgical History:  Procedure Laterality Date  . BACK SURGERY  1980  . CORONARY/GRAFT ACUTE MI REVASCULARIZATION N/A 10/26/2019   Procedure: Coronary/Graft Acute MI Revascularization;  Surgeon: Sherren Mocha, MD;  Location: [place] [facility];  Service: Cardiovascular;  Laterality: N/A;  . ELBOW SURGERY     left  . FRACTURE SURGERY    . LEFT HEART CATH AND CORONARY ANGIOGRAPHY N/A 10/26/2019   Procedure: LEFT HEART CATH AND CORONARY ANGIOGRAPHY;  Surgeon: Sherren Mocha, MD;  Location: [place] [facility];  Service: Cardiovascular;  Laterality: N/A;  . SPINE SURGERY      Current Outpatient Medications  Medication Sig Dispense Refill  . aspirin EC 81 MG tablet Take 81 mg by mouth daily.    [name] Kitchen atorvastatin (LIPITOR) 80 MG tablet Take 1 tablet (80 mg total) by mouth daily at 6 PM. 90 tablet 3  . busPIRone (BUSPAR) 5 MG tablet Take 5 mg by mouth as needed (for anxiety).    . Cranberry 1000 MG CAPS Take 1 capsule by mouth daily at 12 noon.    [name] Kitchen  docusate sodium (COLACE) 100 MG capsule Take 200 mg by mouth daily.     [name] Kitchen levothyroxine (SYNTHROID) 175 MCG tablet Take 1 tablet (175 mcg total) by mouth daily before breakfast. Take 1 tablet by mouth every day in the am 45 tablet 1  . lisinopril-hydrochlorothiazide (ZESTORETIC) 20-25 MG tablet Take 0.5 tablets by mouth daily. 90 tablet 1  . metoprolol tartrate (LOPRESSOR) 25 MG tablet Take 0.5 tablets (12.5 mg total) by mouth 2 (two) times daily. 180 tablet 3  . nitroGLYCERIN (NITROSTAT) 0.4 MG SL tablet Place 1 tablet (0.4 mg total) under the  tongue every 5 (five) minutes x 3 doses as needed for chest pain. 25 tablet 3  . oxybutynin (DITROPAN-XL) 10 MG 24 hr tablet TAKE 1 TABLET BY MOUTH EVERYDAY AT BEDTIME 90 tablet 1  . ticagrelor (BRILINTA) 90 MG TABS tablet Take 1 tablet (90 mg total) by mouth 2 (two) times daily. 180 tablet 3   Current Facility-Administered Medications  Medication Dose Route Frequency Provider Last Rate Last Admin  . Ring Pessary/Support MISC 1 each  1 each Does not apply Once Anyanwu, Ugonna A, MD       Allergies:  Codeine and Norvasc [amlodipine]   Social History: The patient  reports that she quit smoking about 33 years ago. She has never used smokeless tobacco. She reports that she does not drink alcohol or use drugs.   Family History: The patient's family history includes Hypertension in her mother.   ROS:  Please see the history of present illness. Otherwise, complete review of systems is positive for none.  All other systems are reviewed and negative.   Physical Exam: VS:  BP (!) 130/52   Pulse 75   Ht '5\' 6"'  (1.676 m)   Wt 148 lb 12.8 oz (67.5 kg)   SpO2 95%   BMI 24.02 kg/m , BMI Body mass index is 24.02 kg/m.  Wt Readings from Last 3 Encounters:  11/08/19 148 lb 12.8 oz (67.5 kg)  10/28/19 149 lb 12.8 oz (67.9 kg)  06/30/19 148 lb 6.4 oz (67.3 kg)    General: Patient appears comfortable at rest. Neck: Supple, no elevated JVP or carotid bruits, no thyromegaly. Lungs: Clear to auscultation, nonlabored breathing at rest. Cardiac: Regular rate and rhythm, no S3 or significant systolic murmur, no pericardial rub. Extremities: No pitting edema, distal pulses 2+. Skin: Warm and dry. Musculoskeletal: No kyphosis. Neuropsychiatric: Alert and oriented x3, affect grossly appropriate.  ECG:  An ECG dated 11/08/2019 was personally reviewed today and demonstrated:  Normal sinus rhythm rate of 70.  ST inversions in 2,3, aVF, V3 V4 V5.  Recent Labwork: 10/26/2019: ALT 14; AST 18 10/27/2019: BUN 24;  Creatinine, Ser 1.20; Hemoglobin 10.0; Platelets 347; Potassium 3.9; Sodium 134; TSH 0.117     Component Value Date/Time   CHOL 190 10/26/2019 2246   CHOL 156 06/30/2019 1048   TRIG 198 (H) 10/26/2019 2246   HDL 40 (L) 10/26/2019 2246   HDL 48 06/30/2019 1048   CHOLHDL 4.8 10/26/2019 2246   VLDL 40 10/26/2019 2246   LDLCALC 110 (H) 10/26/2019 2246   LDLCALC 88 06/30/2019 1048    Other Studies Reviewed Today:  Echocardiogram on 10/27/2018 1. Left ventricular ejection fraction, by estimation, is 60 to 65%. The left ventricle has normal function. The left ventricle has no regional wall motion abnormalities. Left ventricular diastolic parameters were normal. 2. Right ventricular systolic function is normal. The right ventricular size is normal. 3. Left atrial size was mildly  dilated. 4. The mitral valve is normal in structure and function. No evidence of mitral valve regurgitation. No evidence of mitral stenosis. 5. The aortic valve is normal in structure and function. Aortic valve regurgitation is not visualized. No aortic stenosis is present. 6. The inferior vena cava is normal in size with greater than 50% respiratory variability, suggesting right atrial pressure of 3 mmHg. Conclusion(s)/Recommendation(s): No evidence of valvular vegetations on this transthoracic echocardiogram. Would recommend a transesophageal echocardiogram to exclude infective endocarditis if clinically indicated.  Cardiac catheterization 3/3/ 2021  A drug-eluting stent was successfully placed using a SYNERGY XD 2.75X24.  Post intervention, there is a 0% residual stenosis.   1.  Severe single-vessel coronary artery disease with a proximal RCA culprit lesion, treated successfully with primary PCI using a 2.75 x 24 mm Synergy DES 2.  Mild diffuse nonobstructive disease involving the LAD and left circumflex without any significant stenoses 3.  Normal LVEDP 4.  Moderately severe diffuse residual stenosis in the  mid and distal RCA, favor initial medical therapy.  Diagnostic Dominance: Right  Intervention     Assessment and Plan:  1. ST elevation myocardial infarction involving right coronary artery ([place])   2. Essential hypertension   3. Mixed hyperlipidemia    1. ST elevation myocardial infarction involving right coronary artery ([place]) Recent inferior STEMI 10/27/2019 with DES to proximal RCA.  Patient had some residual distal stenosis with 75% distal to stent and 80% more distally.  Dr. Burt Knack recommended medical management.  Continue aspirin 81 mg, Brilinta 90 mg p.o. twice daily.  Nitroglycerin 0.4 mg sublingual as needed.  Get a follow-up c-Met and FLP in 6 weeks.  Patient has a referral to cardiac rehab. She states she has had no anginal or exertional symptoms since DC. She has not been very active since discharge. Her right groin access site is clean and dry without redness ,swelling, hematoma. Advised her to resume normal actitvities gradually.   2. Essential hypertension Patient is normotensive today with a blood pressure of 130/52.  Continue lisinopril/hydrochlorothiazide 20/25 mg 1/2 pill daily.  3. Mixed hyperlipidemia  10/26/2019: Cholesterol 190; HDL 40; LDL Cholesterol 110; Triglycerides 198; VLDL 40 : Continue Lipitor 80 mg daily. Lipitor was recently started on 10/28/2019. She will need a follow up lipid profile prior to next follow up.    Medication Adjustments/Labs and Tests Ordered: Current medicines are reviewed at length with the patient today.  Concerns regarding medicines are outlined above.   Disposition: Follow-up with Dr. Irish Lack or APP 2 months  Signed, Levell July, NP 11/10/2019 11:49 PM    [place]

## 2019-11-10 NOTE — Telephone Encounter (Signed)
Called and spoke with pt in regards to CR, pt stated she has 2 more weeks in PT.  Will place pt ppw in green "ready to schedule" folder and f/u with her in 2 weeks.

## 2019-11-14 ENCOUNTER — Ambulatory Visit: Payer: Self-pay | Admitting: Nurse Practitioner

## 2019-11-20 ENCOUNTER — Other Ambulatory Visit: Payer: Self-pay | Admitting: Nurse Practitioner

## 2019-11-20 NOTE — Progress Notes (Signed)
Virtual Visit via Telephone   This visit type was conducted due to national recommendations for restrictions regarding the COVID-19 Pandemic (e.g. social distancing) in an effort to limit this patient's exposure and mitigate transmission in our community.  Due to her co-morbid illnesses, this patient is at least at moderate risk for complications without adequate follow up.  This format is felt to be most appropriate for this patient at this time.  All issues noted in this document were discussed and addressed.  A limited physical exam was performed with this format.    This visit type was conducted due to national recommendations for restrictions regarding the COVID-19 Pandemic (e.g. social distancing) in an effort to limit this patient's exposure and mitigate transmission in our community.  Patients identity confirmed using two different identifiers.  This format is felt to be most appropriate for this patient at this time.  All issues noted in this document were discussed and addressed.  No physical exam was performed (except for noted visual exam findings with Video Visits).    Date:  12/28/2019   ID:  Abigail [name], DOB [birth year], MRN [id number]  Patient Location:  Home - spoke with [name]  Provider location:   Office    Chief Complaint:  Hospital follow up  History of Present Illness:    Abigail [name] is a 84 y.o. female who presents via video conferencing for a telehealth visit today.    The patient does not have symptoms concerning for COVID-19 infection (fever, chills, cough, or new shortness of breath).   Telephone visit for a hospital follow up   She was having chest pressure on 10/27/2019 occurred off and on for 3-4 days and called EMS due to thought was COVID, she would take baby aspirin and would help.  When EKG placed was having a heart attack. She had been to Dr. Einar Gip years ago with some caths. She had a stent placed to the right coronary artery.    She is not doing  anything such as housework, she has a friend coming to take the dog out.   She is scheduled to see Cardiology on March 16th.  She had a PT visit, he is going to set her up with MSW through [facility]. She is interested in having her Covid vaccine.      Past Medical History:  Diagnosis Date  . Carotid artery occlusion   . Complication of anesthesia    hard to wake up per pt  . Diverticulitis   . Hypertension   . Hypothyroid   . STEMI (ST elevation myocardial infarction) ([place]) 10/26/2019   DES RCA   Past Surgical History:  Procedure Laterality Date  . BACK SURGERY  1980  . CORONARY/GRAFT ACUTE MI REVASCULARIZATION N/A 10/26/2019   Procedure: Coronary/Graft Acute MI Revascularization;  Surgeon: Sherren Mocha, MD;  Location: [place] [facility];  Service: Cardiovascular;  Laterality: N/A;  . ELBOW SURGERY     left  . FRACTURE SURGERY    . LEFT HEART CATH AND CORONARY ANGIOGRAPHY N/A 10/26/2019   Procedure: LEFT HEART CATH AND CORONARY ANGIOGRAPHY;  Surgeon: Sherren Mocha, MD;  Location: [place] [facility];  Service: Cardiovascular;  Laterality: N/A;  . SPINE SURGERY       Current Meds  Medication Sig  . aspirin EC 81 MG tablet Take 81 mg by mouth daily.  [name] Kitchen atorvastatin (LIPITOR) 80 MG tablet Take 1 tablet (80 mg total) by mouth daily at 6 PM.  .  Cranberry 1000 MG CAPS Take 1 capsule by mouth daily at 12 noon.  . docusate sodium (COLACE) 100 MG capsule Take 200-300 mg by mouth daily as needed for mild constipation.   [name] Kitchen levothyroxine (SYNTHROID) 175 MCG tablet Take 1 tablet (175 mcg total) by mouth daily before breakfast. Take 1 tablet by mouth every day in the am (Patient taking differently: Take 175 mcg by mouth daily before breakfast. Do not take medicine on sundays)  . metoprolol tartrate (LOPRESSOR) 25 MG tablet Take 0.5 tablets (12.5 mg total) by mouth 2 (two) times daily.  . nitroGLYCERIN (NITROSTAT) 0.4 MG SL tablet Place 1 tablet (0.4 mg total) under the tongue every 5 (five)  minutes x 3 doses as needed for chest pain.  [name] Kitchen oxybutynin (DITROPAN-XL) 10 MG 24 hr tablet TAKE 1 TABLET BY MOUTH EVERYDAY AT BEDTIME (Patient taking differently: Take 10 mg by mouth at bedtime. )  . ticagrelor (BRILINTA) 90 MG TABS tablet Take 1 tablet (90 mg total) by mouth 2 (two) times daily.  . [DISCONTINUED] lisinopril-hydrochlorothiazide (ZESTORETIC) 20-25 MG tablet Take 0.5 tablets by mouth daily.   Current Facility-Administered Medications for the 11/01/19 encounter (Telemedicine) with [name], FNP  Medication  . Ring Pessary/Support MISC 1 each     Allergies:   Codeine and Norvasc [amlodipine]   Social History   Tobacco Use  . Smoking status: Former Smoker    Quit date: 04/05/1986    Years since quitting: 33.7  . Smokeless tobacco: Never Used  Substance Use Topics  . Alcohol use: No  . Drug use: No     Family Hx: The patient's family history includes Hypertension in her mother.  ROS:   Please see the history of present illness.    ROS  All other systems reviewed and are negative.   Labs/Other Tests and Data Reviewed:    Recent Labs: 11/29/2019: TSH 0.055 12/01/2019: ALT 16; B Natriuretic Peptide 145.3; Hemoglobin 11.0; Platelets 365 12/02/2019: BUN 22; Creatinine, Ser 1.30; Potassium 4.2; Sodium 131   Recent Lipid Panel Lab Results  Component Value Date/Time   CHOL 190 10/26/2019 10:46 PM   CHOL 156 06/30/2019 10:48 AM   TRIG 198 (H) 10/26/2019 10:46 PM   HDL 40 (L) 10/26/2019 10:46 PM   HDL 48 06/30/2019 10:48 AM   CHOLHDL 4.8 10/26/2019 10:46 PM   LDLCALC 110 (H) 10/26/2019 10:46 PM   LDLCALC 88 06/30/2019 10:48 AM    Wt Readings from Last 3 Encounters:  12/13/19 145 lb 9.6 oz (66 kg)  12/02/19 144 lb 6.4 oz (65.5 kg)  11/29/19 146 lb (66.2 kg)     Exam:    Vital Signs:  BP 132/62 (BP Location: Left Arm, Patient Position: Sitting, Cuff Size: Small)   Pulse (!) 46   Temp 100.1 F (37.8 C) (Oral)     Physical Exam  Constitutional: She is  oriented to person, place, and time and well-developed, well-nourished, and in no distress. No distress.  Pulmonary/Chest: No respiratory distress.  Neurological: She is oriented to person, place, and time.  Psychiatric: Mood, memory, affect and judgment normal.    ASSESSMENT & PLAN:    1. STEMI involving right coronary artery ([place]) TCM Performed. A member of the clinical team spoke with the patient upon dischare. Discharge summary was reviewed in full detail during the visit. Meds reconciled and compared to discharge meds. Medication list is updated and reviewed with the patient.  Greater than 50% face to face time was spent in counseling an  coordination of care.  All questions were answered to the satisfaction of the patient.   She had a stent placed to her right coronary artery due to severe single vessel CAD with proximal RCA culprit lesion Admission was from 3/3-3/5.    2. Acquired hypothyroidism  She also had her medication changed to 175 mcg daily due to low TSH and elevated T4, will recheck in 4 weeks.   COVID-19 Education: The signs and symptoms of COVID-19 were discussed with the patient and how to seek care for testing (follow up with PCP or arrange E-visit).  The importance of social distancing was discussed today.  Patient Risk:   After full review of this patients clinical status, I feel that they are at least moderate risk at this time.  Time:   Today, I have spent 20 minutes/ seconds with the patient with telehealth technology discussing above diagnoses.     Medication Adjustments/Labs and Tests Ordered: Current medicines are reviewed at length with the patient today.  Concerns regarding medicines are outlined above.   Tests Ordered: No orders of the defined types were placed in this encounter.   Medication Changes: No orders of the defined types were placed in this encounter.   Disposition:  Follow up prn  Signed, [name], FNP

## 2019-11-25 ENCOUNTER — Telehealth: Payer: Self-pay

## 2019-11-25 DIAGNOSIS — Z7902 Long term (current) use of antithrombotics/antiplatelets: Secondary | ICD-10-CM | POA: Diagnosis not present

## 2019-11-25 DIAGNOSIS — E039 Hypothyroidism, unspecified: Secondary | ICD-10-CM | POA: Diagnosis not present

## 2019-11-25 DIAGNOSIS — R7303 Prediabetes: Secondary | ICD-10-CM | POA: Diagnosis not present

## 2019-11-25 DIAGNOSIS — Z48812 Encounter for surgical aftercare following surgery on the circulatory system: Secondary | ICD-10-CM | POA: Diagnosis not present

## 2019-11-25 DIAGNOSIS — I2111 ST elevation (STEMI) myocardial infarction involving right coronary artery: Secondary | ICD-10-CM | POA: Diagnosis not present

## 2019-11-25 DIAGNOSIS — Z87891 Personal history of nicotine dependence: Secondary | ICD-10-CM | POA: Diagnosis not present

## 2019-11-25 DIAGNOSIS — Z955 Presence of coronary angioplasty implant and graft: Secondary | ICD-10-CM | POA: Diagnosis not present

## 2019-11-25 DIAGNOSIS — I119 Hypertensive heart disease without heart failure: Secondary | ICD-10-CM | POA: Diagnosis not present

## 2019-11-25 DIAGNOSIS — Z7982 Long term (current) use of aspirin: Secondary | ICD-10-CM | POA: Diagnosis not present

## 2019-11-25 DIAGNOSIS — I700 Atherosclerosis of aorta: Secondary | ICD-10-CM | POA: Diagnosis not present

## 2019-11-25 DIAGNOSIS — I2510 Atherosclerotic heart disease of native coronary artery without angina pectoris: Secondary | ICD-10-CM | POA: Diagnosis not present

## 2019-11-25 DIAGNOSIS — E785 Hyperlipidemia, unspecified: Secondary | ICD-10-CM | POA: Diagnosis not present

## 2019-11-25 DIAGNOSIS — R001 Bradycardia, unspecified: Secondary | ICD-10-CM | POA: Diagnosis not present

## 2019-11-25 DIAGNOSIS — Z9181 History of falling: Secondary | ICD-10-CM | POA: Diagnosis not present

## 2019-11-29 ENCOUNTER — Encounter: Payer: Self-pay | Admitting: Nurse Practitioner

## 2019-11-29 ENCOUNTER — Ambulatory Visit (INDEPENDENT_AMBULATORY_CARE_PROVIDER_SITE_OTHER): Payer: PPO | Admitting: Nurse Practitioner

## 2019-11-29 ENCOUNTER — Other Ambulatory Visit: Payer: Self-pay

## 2019-11-29 VITALS — BP 102/62 | HR 44 | Temp 97.6°F | Ht 61.2 in | Wt 146.0 lb

## 2019-11-29 DIAGNOSIS — E039 Hypothyroidism, unspecified: Secondary | ICD-10-CM | POA: Diagnosis not present

## 2019-11-29 DIAGNOSIS — R001 Bradycardia, unspecified: Secondary | ICD-10-CM

## 2019-11-29 DIAGNOSIS — R319 Hematuria, unspecified: Secondary | ICD-10-CM

## 2019-11-29 DIAGNOSIS — R5383 Other fatigue: Secondary | ICD-10-CM

## 2019-11-29 LAB — POCT URINALYSIS DIPSTICK
Bilirubin, UA: NEGATIVE
Glucose, UA: NEGATIVE
Ketones, UA: NEGATIVE
Nitrite, UA: NEGATIVE
Protein, UA: NEGATIVE
Spec Grav, UA: 1.02 (ref 1.010–1.025)
Urobilinogen, UA: 0.2 E.U./dL
pH, UA: 6 (ref 5.0–8.0)

## 2019-11-29 MED ORDER — NITROFURANTOIN MONOHYD MACRO 100 MG PO CAPS: 100.0000 mg | ORAL_CAPSULE | Freq: Two times a day (BID) | ORAL | 0 refills | Status: DC

## 2019-11-29 NOTE — Progress Notes (Signed)
This visit occurred during the SARS-CoV-2 public health emergency.  Safety protocols were in place, including screening questions prior to the visit, additional usage of staff PPE, and extensive cleaning of exam room while observing appropriate contact time as indicated for disinfecting solutions.  Subjective:     Patient ID: Abigail [name] , female    DOB: 1936/04/09 , 84 y.o.   MRN: [id number]   Chief Complaint  Patient presents with  . Dysuria    patient stated she feels real weak, she has had some urgency a few times as well    HPI  She reports she has not been feeling well since Sunday. When she talked to the health nurse at [facility] advised to come to office to be evaluated.  She has not felt like doing much of anything.    Dysuria  This is a new problem. The problem occurs intermittently. Associated symptoms include urgency. Pertinent negatives include no chills, flank pain or hesitancy. The treatment provided no relief.     Past Medical History:  Diagnosis Date  . Carotid artery occlusion   . Complication of anesthesia    hard to wake up per pt  . Diverticulitis   . Hypertension   . Hypothyroid      Family History  Problem Relation Age of Onset  . Hypertension Mother      Current Outpatient Medications:  .  aspirin EC 81 MG tablet, Take 81 mg by mouth daily., Disp: , Rfl:  .  atorvastatin (LIPITOR) 80 MG tablet, Take 1 tablet (80 mg total) by mouth daily at 6 PM., Disp: 90 tablet, Rfl: 3 .  busPIRone (BUSPAR) 5 MG tablet, Take 5 mg by mouth as needed (for anxiety)., Disp: , Rfl:  .  Cranberry 1000 MG CAPS, Take 1 capsule by mouth daily at 12 noon., Disp: , Rfl:  .  docusate sodium (COLACE) 100 MG capsule, Take 200 mg by mouth daily. , Disp: , Rfl:  .  levothyroxine (SYNTHROID) 175 MCG tablet, Take 1 tablet (175 mcg total) by mouth daily before breakfast. Take 1 tablet by mouth every day in the am, Disp: 45 tablet, Rfl: 1 .  lisinopril-hydrochlorothiazide (ZESTORETIC)  20-25 MG tablet, Take 0.5 tablets by mouth daily., Disp: 90 tablet, Rfl: 1 .  metoprolol tartrate (LOPRESSOR) 25 MG tablet, Take 0.5 tablets (12.5 mg total) by mouth 2 (two) times daily., Disp: 180 tablet, Rfl: 3 .  nitroGLYCERIN (NITROSTAT) 0.4 MG SL tablet, Place 1 tablet (0.4 mg total) under the tongue every 5 (five) minutes x 3 doses as needed for chest pain., Disp: 25 tablet, Rfl: 3 .  oxybutynin (DITROPAN-XL) 10 MG 24 hr tablet, TAKE 1 TABLET BY MOUTH EVERYDAY AT BEDTIME, Disp: 90 tablet, Rfl: 1 .  ticagrelor (BRILINTA) 90 MG TABS tablet, Take 1 tablet (90 mg total) by mouth 2 (two) times daily., Disp: 180 tablet, Rfl: 3  Current Facility-Administered Medications:  .  Ring Pessary/Support MISC 1 each, 1 each, Does not apply, Once, Anyanwu, Sallyanne Havers, MD   Allergies  Allergen Reactions  . Codeine Nausea And Vomiting  . Norvasc [Amlodipine] Rash and Other (See Comments)    [place]     Review of Systems  Constitutional: Negative for chills and fatigue.  Respiratory: Negative.   Cardiovascular: Negative.  Negative for chest pain, palpitations and leg swelling.  Genitourinary: Positive for urgency. Negative for dysuria, flank pain and hesitancy.  Neurological: Positive for weakness. Negative for dizziness and headaches.  Psychiatric/Behavioral: Negative.  Today's Vitals   11/29/19 1132  BP: 102/62  Pulse: (!) 44  Temp: 97.6 F (36.4 C)  TempSrc: Oral  Weight: 146 lb (66.2 kg)  Height: 5' 1.2" (1.554 m)  PainSc: 0-No pain   Body mass index is 27.41 kg/m.   Objective:  Physical Exam Vitals reviewed.  Constitutional:      Appearance: Normal appearance.  Cardiovascular:     Rate and Rhythm: Bradycardia present.     Pulses: Normal pulses.     Heart sounds: Normal heart sounds. No murmur.  Pulmonary:     Effort: Pulmonary effort is normal. No respiratory distress.     Breath sounds: Normal breath sounds.  Skin:    Capillary Refill: Capillary refill takes less than 2  seconds.  Neurological:     General: No focal deficit present.     Mental Status: She is alert and oriented to person, place, and time.  Psychiatric:        Mood and Affect: Mood normal.        Thought Content: Thought content normal.        Judgment: Judgment normal.         Assessment And Plan:     1. Hypothyroidism, unspecified type  Chronic, stable,   Will check thyroid levels  - T3, free - T4 - TSH  2. Bradycardia  Heart rate is 44 today she is fatigued  She had been on metoprolol 25 mg BID  I advised her to hold her evening dose of metoprolol until I speak with Cardiology.    EKG showed Bradycardia  3. Hematuria, unspecified type  She has moderate blood in her urine  Will send for culture and treat for UTI with macrodantin until culture returns due the symptoms - Urine Culture - nitrofurantoin, macrocrystal-monohydrate, (MACROBID) 100 MG capsule; Take 1 capsule (100 mg total) by mouth 2 (two) times daily for 5 days.  Dispense: 10 capsule; Refill: 0 - POCT Urinalysis Dipstick (81002)  4. Fatigue, unspecified type  May be related to her low heart rate, will check for metabolic cause as well  I have advised if her symptoms worsen to go to ER for further evaluation   [name], FNP    THE PATIENT IS ENCOURAGED TO PRACTICE SOCIAL DISTANCING DUE TO THE COVID-19 PANDEMIC.

## 2019-11-30 ENCOUNTER — Telehealth: Payer: Self-pay

## 2019-11-30 LAB — T3, FREE: T3, Free: 2.6 pg/mL (ref 2.0–4.4)

## 2019-11-30 LAB — BMP8+EGFR
BUN/Creatinine Ratio: 19 (ref 12–28)
BUN: 25 mg/dL (ref 8–27)
CO2: 19 mmol/L — ABNORMAL LOW (ref 20–29)
Calcium: 9.2 mg/dL (ref 8.7–10.3)
Chloride: 91 mmol/L — ABNORMAL LOW (ref 96–106)
Creatinine, Ser: 1.29 mg/dL — ABNORMAL HIGH (ref 0.57–1.00)
GFR calc Af Amer: 45 mL/min/{1.73_m2} — ABNORMAL LOW (ref >59)
GFR calc non Af Amer: 39 mL/min/{1.73_m2} — ABNORMAL LOW (ref >59)
Glucose: 92 mg/dL (ref 65–99)
Potassium: 5 mmol/L (ref 3.5–5.2)
Sodium: 128 mmol/L — ABNORMAL LOW (ref 134–144)

## 2019-11-30 LAB — CBC
Hematocrit: 32.3 % — ABNORMAL LOW (ref 34.0–46.6)
Hemoglobin: 10.1 g/dL — ABNORMAL LOW (ref 11.1–15.9)
MCH: 27.2 pg (ref 26.6–33.0)
MCHC: 31.3 g/dL — ABNORMAL LOW (ref 31.5–35.7)
MCV: 87 fL (ref 79–97)
Platelets: 336 10*3/uL (ref 150–450)
RBC: 3.72 x10E6/uL — ABNORMAL LOW (ref 3.77–5.28)
RDW: 14 % (ref 11.7–15.4)
WBC: 9.4 10*3/uL (ref 3.4–10.8)

## 2019-11-30 LAB — T4: T4, Total: 11.3 ug/dL (ref 4.5–12.0)

## 2019-11-30 LAB — TSH: TSH: 0.055 u[IU]/mL — ABNORMAL LOW (ref 0.450–4.500)

## 2019-11-30 NOTE — Telephone Encounter (Signed)
Herbert Deaner PT from [facility] called regarding pt antibotic has given her diarrhea and low energy. She needs referral to transitioning to cardiac rehab and also he wanted to check on referral to Cardiology. [phone]   I returned his call and left him a v/m stating we are aware of her symptoms and advised her to stop the antibiotic and just take a probiotic for now and also pt  is already seeing a Cardiologist, [name] DNP,FNP-BC has messaged the provider regarding pt. YL,RMA

## 2019-11-30 NOTE — Telephone Encounter (Signed)
Abigail [name] with [facility] called stating patient is having diarrhea and nausea she thinks it may be due to the antibiotic and thinks it would be better if we switched the pt to a different med. [phone]  I returned her call and left her a v/m notifying her that we advised the pt to stop the antibiotic and just take a probiotic until we get her results back per provider. YL,RMA

## 2019-12-01 ENCOUNTER — Other Ambulatory Visit: Payer: Self-pay

## 2019-12-01 ENCOUNTER — Telehealth (HOSPITAL_COMMUNITY): Payer: Self-pay

## 2019-12-01 ENCOUNTER — Observation Stay (HOSPITAL_COMMUNITY)
Admission: EM | Admit: 2019-12-01 | Discharge: 2019-12-02 | Disposition: A | Discharge status: Home / Self Care | Payer: PPO | Attending: Cardiovascular Disease | Admitting: Cardiovascular Disease

## 2019-12-01 ENCOUNTER — Encounter (HOSPITAL_COMMUNITY): Payer: Self-pay

## 2019-12-01 ENCOUNTER — Encounter (HOSPITAL_COMMUNITY): Payer: Self-pay | Admitting: Physician Assistant

## 2019-12-01 ENCOUNTER — Emergency Department (HOSPITAL_COMMUNITY): Payer: PPO

## 2019-12-01 DIAGNOSIS — E039 Hypothyroidism, unspecified: Secondary | ICD-10-CM | POA: Diagnosis not present

## 2019-12-01 DIAGNOSIS — R079 Chest pain, unspecified: Principal | ICD-10-CM | POA: Diagnosis present

## 2019-12-01 DIAGNOSIS — R3915 Urgency of urination: Secondary | ICD-10-CM | POA: Diagnosis not present

## 2019-12-01 DIAGNOSIS — I10 Essential (primary) hypertension: Secondary | ICD-10-CM | POA: Diagnosis present

## 2019-12-01 DIAGNOSIS — N183 Chronic kidney disease, stage 3 unspecified: Secondary | ICD-10-CM | POA: Insufficient documentation

## 2019-12-01 DIAGNOSIS — I4510 Unspecified right bundle-branch block: Secondary | ICD-10-CM | POA: Diagnosis not present

## 2019-12-01 DIAGNOSIS — I6529 Occlusion and stenosis of unspecified carotid artery: Secondary | ICD-10-CM | POA: Insufficient documentation

## 2019-12-01 DIAGNOSIS — Z79890 Hormone replacement therapy (postmenopausal): Secondary | ICD-10-CM | POA: Diagnosis not present

## 2019-12-01 DIAGNOSIS — R0789 Other chest pain: Secondary | ICD-10-CM | POA: Diagnosis not present

## 2019-12-01 DIAGNOSIS — Z79899 Other long term (current) drug therapy: Secondary | ICD-10-CM | POA: Diagnosis not present

## 2019-12-01 DIAGNOSIS — R0602 Shortness of breath: Secondary | ICD-10-CM | POA: Diagnosis not present

## 2019-12-01 DIAGNOSIS — I129 Hypertensive chronic kidney disease with stage 1 through stage 4 chronic kidney disease, or unspecified chronic kidney disease: Secondary | ICD-10-CM | POA: Diagnosis not present

## 2019-12-01 DIAGNOSIS — Z7982 Long term (current) use of aspirin: Secondary | ICD-10-CM | POA: Diagnosis not present

## 2019-12-01 DIAGNOSIS — K580 Irritable bowel syndrome with diarrhea: Secondary | ICD-10-CM | POA: Insufficient documentation

## 2019-12-01 DIAGNOSIS — E785 Hyperlipidemia, unspecified: Secondary | ICD-10-CM | POA: Diagnosis not present

## 2019-12-01 DIAGNOSIS — I252 Old myocardial infarction: Secondary | ICD-10-CM | POA: Diagnosis not present

## 2019-12-01 DIAGNOSIS — Z955 Presence of coronary angioplasty implant and graft: Secondary | ICD-10-CM | POA: Insufficient documentation

## 2019-12-01 DIAGNOSIS — Z7902 Long term (current) use of antithrombotics/antiplatelets: Secondary | ICD-10-CM | POA: Insufficient documentation

## 2019-12-01 DIAGNOSIS — Z20822 Contact with and (suspected) exposure to covid-19: Secondary | ICD-10-CM | POA: Diagnosis not present

## 2019-12-01 DIAGNOSIS — R7303 Prediabetes: Secondary | ICD-10-CM | POA: Insufficient documentation

## 2019-12-01 DIAGNOSIS — I499 Cardiac arrhythmia, unspecified: Secondary | ICD-10-CM | POA: Diagnosis not present

## 2019-12-01 DIAGNOSIS — Z87891 Personal history of nicotine dependence: Secondary | ICD-10-CM | POA: Diagnosis not present

## 2019-12-01 LAB — BASIC METABOLIC PANEL
Anion gap: 16 — ABNORMAL HIGH (ref 5–15)
BUN: 19 mg/dL (ref 8–23)
CO2: 18 mmol/L — ABNORMAL LOW (ref 22–32)
Calcium: 9.3 mg/dL (ref 8.9–10.3)
Chloride: 96 mmol/L — ABNORMAL LOW (ref 98–111)
Creatinine, Ser: 1.57 mg/dL — ABNORMAL HIGH (ref 0.44–1.00)
GFR calc Af Amer: 35 mL/min — ABNORMAL LOW (ref >60)
GFR calc non Af Amer: 31 mL/min — ABNORMAL LOW (ref >60)
Glucose, Bld: 137 mg/dL — ABNORMAL HIGH (ref 70–99)
Potassium: 4.3 mmol/L (ref 3.5–5.1)
Sodium: 130 mmol/L — ABNORMAL LOW (ref 135–145)

## 2019-12-01 LAB — URINALYSIS, ROUTINE W REFLEX MICROSCOPIC
Bacteria, UA: NONE SEEN
Bilirubin Urine: NEGATIVE
Glucose, UA: NEGATIVE mg/dL
Ketones, ur: 5 mg/dL — AB
Nitrite: NEGATIVE
Protein, ur: NEGATIVE mg/dL
Specific Gravity, Urine: 1.008 (ref 1.005–1.030)
pH: 6 (ref 5.0–8.0)

## 2019-12-01 LAB — TROPONIN I (HIGH SENSITIVITY)
Troponin I (High Sensitivity): 21 ng/L — ABNORMAL HIGH (ref <18)
Troponin I (High Sensitivity): 21 ng/L — ABNORMAL HIGH (ref <18)
Troponin I (High Sensitivity): 21 ng/L — ABNORMAL HIGH (ref <18)

## 2019-12-01 LAB — LIPASE, BLOOD: Lipase: 22 U/L (ref 11–51)

## 2019-12-01 LAB — CBC
HCT: 35 % — ABNORMAL LOW (ref 36.0–46.0)
Hemoglobin: 11 g/dL — ABNORMAL LOW (ref 12.0–15.0)
MCH: 27.6 pg (ref 26.0–34.0)
MCHC: 31.4 g/dL (ref 30.0–36.0)
MCV: 87.7 fL (ref 80.0–100.0)
Platelets: 365 10*3/uL (ref 150–400)
RBC: 3.99 MIL/uL (ref 3.87–5.11)
RDW: 14.6 % (ref 11.5–15.5)
WBC: 12.6 10*3/uL — ABNORMAL HIGH (ref 4.0–10.5)
nRBC: 0 % (ref 0.0–0.2)

## 2019-12-01 LAB — HEPATIC FUNCTION PANEL
ALT: 16 U/L (ref 0–44)
AST: 18 U/L (ref 15–41)
Albumin: 3.7 g/dL (ref 3.5–5.0)
Alkaline Phosphatase: 86 U/L (ref 38–126)
Bilirubin, Direct: 0.2 mg/dL (ref 0.0–0.2)
Indirect Bilirubin: 0.7 mg/dL (ref 0.3–0.9)
Total Bilirubin: 0.9 mg/dL (ref 0.3–1.2)
Total Protein: 7.1 g/dL (ref 6.5–8.1)

## 2019-12-01 LAB — SARS CORONAVIRUS 2 (TAT 6-24 HRS): SARS Coronavirus 2: NEGATIVE

## 2019-12-01 LAB — BRAIN NATRIURETIC PEPTIDE: B Natriuretic Peptide: 145.3 pg/mL — ABNORMAL HIGH (ref 0.0–100.0)

## 2019-12-01 MED ORDER — ENOXAPARIN SODIUM 30 MG/0.3ML ~~LOC~~ SOLN
30.0000 mg | SUBCUTANEOUS | Status: DC
  Administered 2019-12-01: 30 mg via SUBCUTANEOUS
  Filled 2019-12-01: qty 0.3

## 2019-12-01 MED ORDER — SODIUM CHLORIDE 0.9 % IV SOLN: INTRAVENOUS | Status: DC

## 2019-12-01 MED ORDER — SODIUM CHLORIDE 0.9% FLUSH: 3.0000 mL | Freq: Once | INTRAVENOUS | Status: DC

## 2019-12-01 MED ORDER — ZOLPIDEM TARTRATE 5 MG PO TABS: 5.0000 mg | ORAL_TABLET | Freq: Every evening | ORAL | Status: DC | PRN

## 2019-12-01 MED ORDER — BUSPIRONE HCL 5 MG PO TABS: 5.0000 mg | ORAL_TABLET | Freq: Every day | ORAL | Status: DC | PRN

## 2019-12-01 MED ORDER — TICAGRELOR 90 MG PO TABS
90.0000 mg | ORAL_TABLET | Freq: Two times a day (BID) | ORAL | Status: DC
  Administered 2019-12-01 – 2019-12-02 (×3): 90 mg via ORAL
  Filled 2019-12-01 (×3): qty 1

## 2019-12-01 MED ORDER — METOPROLOL TARTRATE 12.5 MG HALF TABLET
12.5000 mg | ORAL_TABLET | Freq: Two times a day (BID) | ORAL | Status: DC
  Administered 2019-12-01 – 2019-12-02 (×3): 12.5 mg via ORAL
  Filled 2019-12-01 (×3): qty 1

## 2019-12-01 MED ORDER — OXYBUTYNIN CHLORIDE ER 10 MG PO TB24
10.0000 mg | ORAL_TABLET | Freq: Every day | ORAL | Status: DC
  Administered 2019-12-01: 10 mg via ORAL
  Filled 2019-12-01 (×3): qty 1

## 2019-12-01 MED ORDER — ONDANSETRON HCL 4 MG/2ML IJ SOLN: 4.0000 mg | Freq: Four times a day (QID) | INTRAMUSCULAR | Status: DC | PRN

## 2019-12-01 MED ORDER — ASPIRIN EC 81 MG PO TBEC
81.0000 mg | DELAYED_RELEASE_TABLET | Freq: Every day | ORAL | Status: DC
  Administered 2019-12-01 – 2019-12-02 (×2): 81 mg via ORAL
  Filled 2019-12-01 (×2): qty 1

## 2019-12-01 MED ORDER — DOCUSATE SODIUM 100 MG PO CAPS: 200.0000 mg | ORAL_CAPSULE | Freq: Every day | ORAL | Status: DC | PRN

## 2019-12-01 MED ORDER — ALPRAZOLAM 0.25 MG PO TABS: 0.2500 mg | ORAL_TABLET | Freq: Two times a day (BID) | ORAL | Status: DC | PRN

## 2019-12-01 MED ORDER — SODIUM CHLORIDE 0.9 % IV BOLUS
500.0000 mL | Freq: Once | INTRAVENOUS | Status: AC
  Administered 2019-12-01: 500 mL via INTRAVENOUS

## 2019-12-01 MED ORDER — ATORVASTATIN CALCIUM 80 MG PO TABS
80.0000 mg | ORAL_TABLET | Freq: Every day | ORAL | Status: DC
  Administered 2019-12-01: 80 mg via ORAL
  Filled 2019-12-01: qty 1

## 2019-12-01 MED ORDER — NITROGLYCERIN 0.4 MG SL SUBL: 0.4000 mg | SUBLINGUAL_TABLET | SUBLINGUAL | Status: DC | PRN

## 2019-12-01 MED ORDER — ACETAMINOPHEN 325 MG PO TABS: 650.0000 mg | ORAL_TABLET | ORAL | Status: DC | PRN

## 2019-12-01 MED ORDER — LEVOTHYROXINE SODIUM 75 MCG PO TABS
175.0000 ug | ORAL_TABLET | Freq: Every day | ORAL | Status: DC
  Administered 2019-12-02: 175 ug via ORAL
  Filled 2019-12-01: qty 1

## 2019-12-01 MED ORDER — CRANBERRY 1000 MG PO CAPS: 1.0000 | ORAL_CAPSULE | Freq: Every day | ORAL | Status: DC

## 2019-12-01 NOTE — ED Provider Notes (Signed)
[facility] EMERGENCY DEPARTMENT Provider Note  CSN: [id number] Arrival date & time: 12/01/19 3154  Chief Complaint(s) Chest Pain  HPI Abigail [name] is a 84 y.o. female   Here for elevated HR to 100 at 0200am. Started to have chest pressure sometime today, but doesn't exactly when. None radiating. Not exertional. No alleviating or aggravating factors. No SOB. Took NTG w/o much change. Reported a temp of 103 earlier to day. No coughing or congestion. Urgency, but no dysuria. (currently on abx for ?UTI - started Tues) Nausea w/o emesis +diarrhea Had not felt well for 1 week, fatigued.  She was taken off metop by PCP.   Was here last month for STEMI with RCA stenting.  HPI  Past Medical History Past Medical History:  Diagnosis Date  . Carotid artery occlusion   . Complication of anesthesia    hard to wake up per pt  . Diverticulitis   . Hypertension   . Hypothyroid    Patient Active Problem List   Diagnosis Date Noted  . STEMI involving right coronary artery ([place]) 10/27/2019  . STEMI (ST elevation myocardial infarction) ([place]) 10/26/2019  . Anxiety 02/09/2019  . Prediabetes 12/29/2018  . Upper respiratory tract infection 10/21/2018  . Essential hypertension 08/27/2018  . Vitamin D deficiency 06/25/2018  . Hyperlipidemia 06/25/2018  . Hypothyroidism 06/25/2018  . Benign hypertension with CKD (chronic kidney disease) stage III 06/25/2018  . Chronic kidney disease, stage III (moderate) 06/25/2018  . OAB (overactive bladder) 08/26/2016  . Complete uterine prolapse 06/21/2015  . CAP (community acquired pneumonia) 04/12/2012  . Bacteremia 04/08/2012  . Urinary tract infection without hematuria 04/05/2012  . Nausea and vomiting 04/05/2012  . LLQ abdominal pain 04/05/2012  . Acute renal failure ([place]) 04/05/2012  . Hyponatremia 04/05/2012   Home Medication(s) Prior to Admission medications   Medication Sig Start Date End Date Taking? Authorizing  Provider  aspirin EC 81 MG tablet Take 81 mg by mouth daily.    [provider]  atorvastatin (LIPITOR) 80 MG tablet Take 1 tablet (80 mg total) by mouth daily at 6 PM. 10/28/19   [facility], Tami Lin, PA  busPIRone (BUSPAR) 5 MG tablet Take 5 mg by mouth as needed (for anxiety).    [provider]  Cranberry 1000 MG CAPS Take 1 capsule by mouth daily at 12 noon.    [provider]  docusate sodium (COLACE) 100 MG capsule Take 200 mg by mouth daily.     [provider]  levothyroxine (SYNTHROID) 175 MCG tablet Take 1 tablet (175 mcg total) by mouth daily before breakfast. Take 1 tablet by mouth every day in the am 10/28/19   Ledora Bottcher, PA  lisinopril-hydrochlorothiazide (ZESTORETIC) 20-25 MG tablet Take 0.5 tablets by mouth daily. 10/28/19   [facility], Tami Lin, PA  metoprolol tartrate (LOPRESSOR) 25 MG tablet Take 0.5 tablets (12.5 mg total) by mouth 2 (two) times daily. 10/28/19   [facility], Tami Lin, PA  nitrofurantoin, macrocrystal-monohydrate, (MACROBID) 100 MG capsule Take 1 capsule (100 mg total) by mouth 2 (two) times daily for 5 days. 11/29/19 12/04/19  [name], FNP  nitroGLYCERIN (NITROSTAT) 0.4 MG SL tablet Place 1 tablet (0.4 mg total) under the tongue every 5 (five) minutes x 3 doses as needed for chest pain. 10/28/19   Ledora Bottcher, PA  oxybutynin (DITROPAN-XL) 10 MG 24 hr tablet TAKE 1 TABLET BY MOUTH EVERYDAY AT BEDTIME 09/19/19   [name], FNP  ticagrelor (BRILINTA) 90 MG TABS  tablet Take 1 tablet (90 mg total) by mouth 2 (two) times daily. 10/28/19   Ledora Bottcher, PA                                                                                                                                    Past Surgical History Past Surgical History:  Procedure Laterality Date  . BACK SURGERY  1980  . CORONARY/GRAFT ACUTE MI REVASCULARIZATION N/A 10/26/2019   Procedure: Coronary/Graft Acute MI Revascularization;  Surgeon: Sherren Mocha,  MD;  Location: [facility];  Service: Cardiovascular;  Laterality: N/A;  . ELBOW SURGERY     left  . FRACTURE SURGERY    . LEFT HEART CATH AND CORONARY ANGIOGRAPHY N/A 10/26/2019   Procedure: LEFT HEART CATH AND CORONARY ANGIOGRAPHY;  Surgeon: Sherren Mocha, MD;  Location: [facility];  Service: Cardiovascular;  Laterality: N/A;  . SPINE SURGERY     Family History Family History  Problem Relation Age of Onset  . Hypertension Mother     Social History Social History   Tobacco Use  . Smoking status: Former Smoker    Quit date: 04/05/1986    Years since quitting: 33.6  . Smokeless tobacco: Never Used  Substance Use Topics  . Alcohol use: No  . Drug use: No   Allergies Codeine and Norvasc [amlodipine]  Review of Systems Review of Systems All other systems are reviewed and are negative for acute change except as noted in the HPI  Physical Exam Vital Signs  I have reviewed the triage vital signs BP 136/66 (BP Location: Left Arm)   Pulse 95   Temp 97.8 F (36.6 C) (Oral)   Resp 16   SpO2 97%   Physical Exam Vitals reviewed.  Constitutional:      General: She is not in acute distress.    Appearance: She is well-developed. She is not diaphoretic.  HENT:     Head: Normocephalic and atraumatic.     Nose: Nose normal.  Eyes:     General: No scleral icterus.       Right eye: No discharge.        Left eye: No discharge.     Conjunctiva/sclera: Conjunctivae normal.     Pupils: Pupils are equal, round, and reactive to light.  Cardiovascular:     Rate and Rhythm: Normal rate and regular rhythm.     Heart sounds: No murmur. No friction rub. No gallop.   Pulmonary:     Effort: Pulmonary effort is normal. No respiratory distress.     Breath sounds: Normal breath sounds. No stridor. No rales.  Abdominal:     General: There is no distension.     Palpations: Abdomen is soft.     Tenderness: There is no abdominal tenderness.  Musculoskeletal:        General:  No tenderness.     Cervical back: Normal range of motion  and neck supple.     Right lower leg: No edema.     Left lower leg: No edema.  Skin:    General: Skin is warm and dry.     Findings: No erythema or rash.  Neurological:     Mental Status: She is alert and oriented to person, place, and time.     ED Results and Treatments Labs (all labs ordered are listed, but only abnormal results are displayed) Labs Reviewed  BASIC METABOLIC PANEL - Abnormal; Notable for the following components:      Result Value   Sodium 130 (*)    Chloride 96 (*)    CO2 18 (*)    Glucose, Bld 137 (*)    Creatinine, Ser 1.57 (*)    GFR calc non Af Amer 31 (*)    GFR calc Af Amer 35 (*)    Anion gap 16 (*)    All other components within normal limits  CBC - Abnormal; Notable for the following components:   WBC 12.6 (*)    Hemoglobin 11.0 (*)    HCT 35.0 (*)    All other components within normal limits  TROPONIN I (HIGH SENSITIVITY) - Abnormal; Notable for the following components:   Troponin I (High Sensitivity) 21 (*)    All other components within normal limits  HEPATIC FUNCTION PANEL  LIPASE, BLOOD  BRAIN NATRIURETIC PEPTIDE  URINALYSIS, ROUTINE W REFLEX MICROSCOPIC                                                                                                                         EKG  EKG Interpretation  Date/Time:  Thursday December 01 2019 03:15:24 EDT Ventricular Rate:  93 PR Interval:  156 QRS Duration: 144 QT Interval:  398 QTC Calculation: 494 R Axis:   79 Text Interpretation: Normal sinus rhythm Right bundle branch block T wave abnormality, consider lateral ischemia Abnormal ECG ?lead placement Confirmed by Addison Lank [phone]) on 12/01/2019 4:33:20 AM       EKG Interpretation  Date/Time:  Thursday December 01 2019 04:27:40 EDT Ventricular Rate:  73 PR Interval:  156 QRS Duration: 110 QT Interval:  395 QTC Calculation: 436 R Axis:   84 Text Interpretation: Sinus rhythm  Borderline right axis deviation Borderline repolarization abnormality resolved lead placement discrepancy. otherwise, mild ST dep in inferior leads Confirmed by Addison Lank [phone]) on 12/01/2019 4:35:42 AM       Radiology DG Chest 2 View  Result Date: 12/01/2019 CLINICAL DATA:  Chest pain and nausea EXAM: CHEST - 2 VIEW COMPARISON:  10/26/2019 FINDINGS: Normal heart size and aortic contours. Moderate hiatal hernia with small fluid level. Interstitial prominence that is stable from prior. There is no edema, consolidation, effusion, or pneumothorax. Coronary stenting seen on the lateral view. IMPRESSION: No evidence of acute disease. Electronically Signed   By: Monte Fantasia M.D.   On: 12/01/2019 04:27    Pertinent labs & imaging results that were available during  my care of the patient were reviewed by me and considered in my medical decision making (see chart for details).  Medications Ordered in ED Medications  sodium chloride flush (NS) 0.9 % injection 3 mL (has no administration in time range)                                                                                                                                    Procedures Procedures  (including critical care time)  Medical Decision Making / ED Course I have reviewed the nursing notes for this encounter and the patient's prior records (if available in EHR or on provided paperwork).   CHERYLL KEISLER was evaluated in Emergency Department on 12/01/2019 for the symptoms described in the history of present illness. She was evaluated in the context of the global COVID-19 pandemic, which necessitated consideration that the patient might be at risk for infection with the SARS-CoV-2 virus that causes COVID-19. Institutional protocols and algorithms that pertain to the evaluation of patients at risk for COVID-19 are in a state of rapid change based on information released by regulatory bodies including the CDC and federal and state  organizations. These policies and algorithms were followed during the patient's care in the ED.  Patient presents with 1 week of generalized fatigue.  Recently started on antibiotics for likely urinary tract infection.  Tonight she developed substernal chest pain that was nonradiating.  Atypical.  Not much improvement with nitroglycerin. EKG however shows mild ST segment depression in the inferior leads which is new from recent tracing post admission follow-up with cardiology.  Initial troponin mildly elevated.  Will likely need to repeat to see how it is trending.  Will consult cardiology so they can evaluate the patient in the emergency department and provide further recommendations regarding disposition.  Chest x-ray without evidence suggestive of pneumonia, pneumothorax, pneumomediastinum.  No abnormal contour of the mediastinum to suggest dissection. No evidence of acute injuries.  Low suspicion for pulmonary embolism.  Presentation not classic for aortic dissection or esophageal perforation.  No evidence of volume overload on exam and BNP is reassuring.  Pending UA to assess for UTI.  Patient care turned over to Dr Tamera Punt. Patient case and results discussed in detail; please see their note for further ED managment.         This chart was dictated using voice recognition software.  Despite best efforts to proofread,  errors can occur which can change the documentation meaning.   Fatima Blank, MD 12/01/19 [phone]

## 2019-12-01 NOTE — Telephone Encounter (Signed)
Attempted to call patient in regards to Cardiac Rehab - LM on VM Mailed letter 

## 2019-12-01 NOTE — ED Triage Notes (Signed)
The pt arrived by [facility] from home pt c/o chest pressure with n v and diarrhea today.  Recent stent placement 30 days ago in her coronary artery  The pt took nitro x 2 sl and  Aspirin prior to ems arrival   Iv per ems

## 2019-12-01 NOTE — ED Notes (Signed)
Cardiology at bedside.

## 2019-12-01 NOTE — ED Notes (Signed)
Repeat EKG completed. Labs drawn, labeled with 2 pt identifiers, and sent. Pt seen by Dr. Leonette Monarch at bedside PO fluids provided. Fluid bolus initiated per MAR. Name/DOB verified with pt

## 2019-12-01 NOTE — ED Notes (Signed)
Helped pt ambulate to the bathroom to void, no distress.

## 2019-12-01 NOTE — ED Notes (Signed)
Care endorsed to [place], [place]

## 2019-12-01 NOTE — Plan of Care (Signed)

## 2019-12-01 NOTE — H&P (Addendum)
Cardiology Admission History and Physical:   Patient ID: Abigail [name]; MRN: [id number]; DOB: 09/21/1935   Admission date: 12/01/2019  Primary Care Provider: [name], FNP Primary Cardiologist: Larae Grooms, MD 10/25/2019 Primary Electrophysiologist: None    Chief Complaint:  Chest pain  Patient Profile:   Abigail [name] is a 84 y.o. female with a history of STEMI 10/26/2019 with DES RCA w/ med rx for residual dz distal RCA, EF nl, HTN, HLD, borderline DM, carotid dz, hypothyroid, diverticulitis.  History of Present Illness:   Abigail [name] started feeling bad about 2 weeks ago, just felt weak. Did not feel like she could do PT. She had no specific complaints. She had done well just after d/c, felt she was getting her strength back.   However, she then started getting progressively weaker, did not feel like doing anything. When she forced herself to do things, she did not get chest pain or SOB. She has not felt like taking the dog out. This is very unusual for her.  She took nitro a couple of times last pm because she was having chest pressure. 6/10. Her HR was up over 100. This was during the night and she did not feel much better after that. Her HR did not come down right away. She continued to feel uncomfortable, so called 911.   She went to her PCP on 04/06 for fatigue, her HR was 44, lower than previous. She was having some urinary urgency, was started on ABX for possible UTI. Her BB was decreased,Lopressor 25 mg bid >>12.5 mg bid.  On 04/07, she had an episode of explosive diarrhea and then developed some nausea.  However, the symptoms completely resolved prior to the onset of chest pain, which was sometime in the afternoon.  She is not sure how long the chest pain lasted.  Currently she is chest pain-free.  She did not have any nausea, shortness of breath, or diaphoresis with the chest pain.   Past Medical History:  Diagnosis Date  . Carotid artery occlusion   . Complication  of anesthesia    hard to wake up per pt  . Diverticulitis   . Hypertension   . Hypothyroid   . STEMI (ST elevation myocardial infarction) ([place]) 10/26/2019   DES RCA    Past Surgical History:  Procedure Laterality Date  . BACK SURGERY  1980  . CORONARY/GRAFT ACUTE MI REVASCULARIZATION N/A 10/26/2019   Procedure: Coronary/Graft Acute MI Revascularization;  Surgeon: Sherren Mocha, MD;  Location: [facility];  Service: Cardiovascular;  Laterality: N/A;  . ELBOW SURGERY     left  . FRACTURE SURGERY    . LEFT HEART CATH AND CORONARY ANGIOGRAPHY N/A 10/26/2019   Procedure: LEFT HEART CATH AND CORONARY ANGIOGRAPHY;  Surgeon: Sherren Mocha, MD;  Location: [facility];  Service: Cardiovascular;  Laterality: N/A;  . SPINE SURGERY       Medications Prior to Admission: Prior to Admission medications   Medication Sig Start Date End Date Taking? Authorizing Provider  aspirin EC 81 MG tablet Take 81 mg by mouth daily.   Yes [provider]  atorvastatin (LIPITOR) 80 MG tablet Take 1 tablet (80 mg total) by mouth daily at 6 PM. 10/28/19  Yes Duke, Tami Lin, PA  busPIRone (BUSPAR) 5 MG tablet Take 5 mg by mouth daily as needed (anxiety).    Yes [provider]  Cranberry 1000 MG CAPS Take 1 capsule by mouth daily at 12 noon.  Yes [provider]  docusate sodium (COLACE) 100 MG capsule Take 200-300 mg by mouth daily as needed for mild constipation.    Yes [provider]  levothyroxine (SYNTHROID) 175 MCG tablet Take 1 tablet (175 mcg total) by mouth daily before breakfast. Take 1 tablet by mouth every day in the am Patient taking differently: Take 175 mcg by mouth daily before breakfast.  10/28/19  Yes Duke, Tami Lin, PA  lisinopril-hydrochlorothiazide (ZESTORETIC) 20-25 MG tablet Take 0.5 tablets by mouth daily. 10/28/19  Yes Duke, Tami Lin, PA  metoprolol tartrate (LOPRESSOR) 25 MG tablet Take 0.5 tablets (12.5 mg total) by mouth 2 (two)  times daily. 10/28/19  Yes Duke, Tami Lin, PA  nitrofurantoin, macrocrystal-monohydrate, (MACROBID) 100 MG capsule Take 1 capsule (100 mg total) by mouth 2 (two) times daily for 5 days. 11/29/19 12/04/19 Yes [name], FNP  nitroGLYCERIN (NITROSTAT) 0.4 MG SL tablet Place 1 tablet (0.4 mg total) under the tongue every 5 (five) minutes x 3 doses as needed for chest pain. 10/28/19  Yes Duke, Tami Lin, PA  oxybutynin (DITROPAN-XL) 10 MG 24 hr tablet TAKE 1 TABLET BY MOUTH EVERYDAY AT BEDTIME Patient taking differently: Take 10 mg by mouth at bedtime.  09/19/19  Yes [name], FNP  ticagrelor (BRILINTA) 90 MG TABS tablet Take 1 tablet (90 mg total) by mouth 2 (two) times daily. 10/28/19  Yes Duke, Tami Lin, PA     Allergies:    Allergies  Allergen Reactions  . Codeine Nausea And Vomiting  . Norvasc [Amlodipine] Rash and Other (See Comments)    Petechia    Social History:   Social History   Socioeconomic History  . Marital status: Single    Spouse name: Not on file  . Number of children: Not on file  . Years of education: Not on file  . Highest education level: Not on file  Occupational History  . Not on file  Tobacco Use  . Smoking status: Former Smoker    Quit date: 04/05/1986    Years since quitting: 33.6  . Smokeless tobacco: Never Used  Substance and Sexual Activity  . Alcohol use: No  . Drug use: No  . Sexual activity: Not Currently    Birth control/protection: Post-menopausal  Other Topics Concern  . Not on file  Social History Narrative  . Not on file   Social Determinants of Health   Financial Resource Strain: Low Risk   . Difficulty of Paying Living Expenses: Not hard at all  Food Insecurity: No Food Insecurity  . Worried About Charity fundraiser in the Last Year: Never true  . Ran Out of Food in the Last Year: Never true  Transportation Needs: No Transportation Needs  . Lack of Transportation (Medical): No  . Lack of Transportation (Non-Medical):  No  Physical Activity: Inactive  . Days of Exercise per Week: 0 days  . Minutes of Exercise per Session: 0 min  Stress: No Stress Concern Present  . Feeling of Stress : Not at all  Social Connections:   . Frequency of Communication with Friends and Family:   . Frequency of Social Gatherings with Friends and Family:   . Attends Religious Services:   . Active Member of Clubs or Organizations:   . Attends Archivist Meetings:   [name] Kitchen Marital Status:   Intimate Partner Violence: Not At Risk  . Fear of Current or Ex-Partner: No  . Emotionally Abused: No  . Physically Abused: No  .  Sexually Abused: No    Family History:  The patient's family history includes Hypertension in her mother.   The patient She indicated that her mother is deceased. She indicated that her father is deceased. She indicated that her sister is alive. She indicated that her maternal grandmother is deceased. She indicated that her maternal grandfather is deceased. She indicated that her paternal grandmother is deceased. She indicated that her paternal grandfather is deceased. She indicated that her daughter is alive. She indicated that her son is alive.   ROS:  Please see the history of present illness.  All other ROS reviewed and negative.     Physical Exam/Data:   Vitals:   12/01/19 0845 12/01/19 0900 12/01/19 0915 12/01/19 0930  BP: (!) 122/53 (!) 133/57 (!) 136/56 135/63  Pulse:  66 66 65  Resp:  19 17 17   Temp:      TempSrc:      SpO2:   94% 94%   No intake or output data in the 24 hours ending 12/01/19 1053 There were no vitals filed for this visit. There is no height or weight on file to calculate BMI.  General:  Well nourished, well developed, female in no acute distress HEENT: normal Lymph: no adenopathy Neck:  JVD not elevated Endocrine:  No thryomegaly Vascular: No carotid bruits; 4/4 extremity pulses 2+ bilaterally  Cardiac:  normal S1, S2; RRR; 2/6 murmur, no rub or gallop  Lungs: Few  rales bases, left greater than right, no wheezing, rhonchi Abd: soft, nontender, no hepatomegaly  Ext: No edema Musculoskeletal:  No deformities, BUE and BLE strength normal and equal Skin: warm and dry  Neuro:  CNs 2-12 intact, no focal abnormalities noted Psych:  Normal affect    EKG:  The ECG was personally reviewed: 04/08 at 03:15, SR, HR 93, RBBB w/ ST changes, QRS duration 144 ms ECG 04/08 at 04:27, SR, HR 73, still w/ RBBB morphology but more similar to 03/05 ECG and QRS duration 110 ms, decreased ST depression Telemetry: SR  Relevant CV Studies:  ECHO: 10/27/2019 1. Left ventricular ejection fraction, by estimation, is 60 to 65%. The  left ventricle has normal function. The left ventricle has no regional  wall motion abnormalities. Left ventricular diastolic parameters were  normal.  2. Right ventricular systolic function is normal. The right ventricular  size is normal.  3. Left atrial size was mildly dilated.  4. The mitral valve is normal in structure and function. No evidence of  mitral valve regurgitation. No evidence of mitral stenosis.  5. The aortic valve is normal in structure and function. Aortic valve  regurgitation is not visualized. No aortic stenosis is present.  6. The inferior vena cava is normal in size with greater than 50%  respiratory variability, suggesting right atrial pressure of 3 mmHg.   Conclusion(s)/Recommendation(s): No evidence of valvular vegetations on  this transthoracic echocardiogram. Would recommend a transesophageal  echocardiogram to exclude infective endocarditis if clinically indicated.   CATH: 10/26/2019  A drug-eluting stent was successfully placed using a SYNERGY XD 2.75X24.  Post intervention, there is a 0% residual stenosis.   1.  Severe single-vessel coronary artery disease with a proximal RCA culprit lesion, treated successfully with primary PCI using a 2.75 x 24 mm Synergy DES 2.  Mild diffuse nonobstructive disease  involving the LAD and left circumflex without any significant stenoses 3.  Normal LVEDP 4.  Moderately severe diffuse residual stenosis in the mid and distal RCA, favor initial medical therapy.  Laboratory Data:  Chemistry Recent Labs  Lab 11/29/19 1238 12/01/19 0335  NA 128* 130*  K 5.0 4.3  CL 91* 96*  CO2 19* 18*  GLUCOSE 92 137*  BUN 25 19  CREATININE 1.29* 1.57*  CALCIUM 9.2 9.3  GFRNONAA 39* 31*  GFRAA 45* 35*  ANIONGAP  --  16*    Recent Labs  Lab 12/01/19 0430  PROT 7.1  ALBUMIN 3.7  AST 18  ALT 16  ALKPHOS 86  BILITOT 0.9   Hematology Recent Labs  Lab 11/29/19 1238 12/01/19 0335  WBC 9.4 12.6*  RBC 3.72* 3.99  HGB 10.1* 11.0*  HCT 32.3* 35.0*  MCV 87 87.7  MCH 27.2 27.6  MCHC 31.3* 31.4  RDW 14.0 14.6  PLT 336 365   Cardiac Enzymes  High Sensitivity Troponin:   Recent Labs  Lab 12/01/19 0335 12/01/19 0430 12/01/19 0725  TROPONINIHS 21* 21* 21*     BNP Recent Labs  Lab 12/01/19 0430  BNP 145.3*     Lipids:  Lab Results  Component Value Date   CHOL 190 10/26/2019   HDL 40 (L) 10/26/2019   LDLCALC 110 (H) 10/26/2019   TRIG 198 (H) 10/26/2019   CHOLHDL 4.8 10/26/2019   INR:  Lab Results  Component Value Date   INR 1.0 10/26/2019   A1c:  Lab Results  Component Value Date   HGBA1C 5.8 (H) 10/26/2019   Thyroid:  Lab Results  Component Value Date   TSH 0.055 (L) 11/29/2019   T3, Free  Date Value Ref Range Status  11/29/2019 2.6 2.0 - 4.4 pg/mL Final   Free T4  Date Value Ref Range Status  10/27/2019 1.27 (H) 0.61 - 1.12 ng/dL Final    Comment:    (NOTE) Biotin ingestion may interfere with free T4 tests. If the results are inconsistent with the TSH level, previous test results, or the clinical presentation, then consider biotin interference. If needed, order repeat testing after stopping biotin. Performed at [facility], [place] [address], [place], [place] [address]       Urinalysis    Component Value  Date/Time   BILIRUBINUR negative 11/29/2019 1655   PROTEINUR Negative 11/29/2019 1655   UROBILINOGEN 0.2 11/29/2019 1655   NITRITE negative 11/29/2019 1655   LEUKOCYTESUR Small (1+) (A) 11/29/2019 1655    Radiology/Studies:  DG Chest 2 View  Result Date: 12/01/2019 CLINICAL DATA:  Chest pain and nausea EXAM: CHEST - 2 VIEW COMPARISON:  10/26/2019 FINDINGS: Normal heart size and aortic contours. Moderate hiatal hernia with small fluid level. Interstitial prominence that is stable from prior. There is no edema, consolidation, effusion, or pneumothorax. Coronary stenting seen on the lateral view. IMPRESSION: No evidence of acute disease. Electronically Signed   By: Monte Fantasia M.D.   On: 12/01/2019 04:27    Assessment and Plan:   1. Chest pain - no hx exertional sx, but has been weak and tired -Fatigue possibly secondary to bradycardia, beta-blocker has been decreased and heart rate is improved. -Enzymes are only minimally elevated, several hours after pain started -No indication that there are any difficulties with her stent, but she does have some residual distal disease, initial medical therapy recommended -Wide right bundle with QRS duration 144 ms is likely rate related -EF was normal at the time of her MI, and no signs or symptoms of volume overload by exam -Admit for observation, monitor heart rate on decreased dose of beta-blocker, have physical therapy reevaluate her -At this time,  no ischemic evaluation indicated, monitor for symptoms -Possible DC a.m. if improved  2.  Hypertension: -Her blood pressure was low in the doctor's office on 4/6, 102/62 and heart rate 44. -In the ER, SBP 120s-130s, heart rate 60s at rest -Continue current therapy  3.  Urinary urgency -Started on antibiotics by her PCP, but had diarrhea and they were stopped after 1 dose -Urinalysis (results not available until after antibiotics started) does not show clear signs of infection, only a small amount  of leukocyte esterase -Repeat urinalysis ordered, culture if significantly abnormal  4.  Hypothyroid: -TSH was checked by her PCP and was low, but free T3 was within normal limits -Continue home dose of Synthroid  5.  Renal insufficiency -Creatinine was above baseline on 10/26/2019, 1.6 on admission.-She was hydrated and creatinine 1.20 at discharge, 1.29 on 4/6 when checked by her PCP. -Creatinine 1.57 today, hydrate and follow -Hold lisinopril HCTZ for now   Principal Problem:   Chest pain with moderate risk for cardiac etiology Active Problems:   Essential hypertension   Urinary urgency   For questions or updates, please contact [place] Please consult [email or web address] for contact info under Cardiology/STEMI.    SignedRosaria Ferries, PA-C  12/01/2019 10:53 AM

## 2019-12-01 NOTE — ED Provider Notes (Signed)
Repeat urinalysis shows no clear signs of infection.  It was sent for culture.  Patient to be admitted by cardiology.   Malvin Johns, MD 12/01/19 [phone]

## 2019-12-01 NOTE — ED Notes (Signed)
Pt unable to give urine sample @ this time. Pt states she will notify staff when she feels able to go.

## 2019-12-01 NOTE — Telephone Encounter (Signed)
Pt returned CR phone call and stated she is currently in the hospital. Pass pt info to [facility] for later f/u.

## 2019-12-02 DIAGNOSIS — R079 Chest pain, unspecified: Secondary | ICD-10-CM | POA: Diagnosis not present

## 2019-12-02 LAB — BASIC METABOLIC PANEL
Anion gap: 10 (ref 5–15)
BUN: 22 mg/dL (ref 8–23)
CO2: 21 mmol/L — ABNORMAL LOW (ref 22–32)
Calcium: 8.5 mg/dL — ABNORMAL LOW (ref 8.9–10.3)
Chloride: 100 mmol/L (ref 98–111)
Creatinine, Ser: 1.3 mg/dL — ABNORMAL HIGH (ref 0.44–1.00)
GFR calc Af Amer: 45 mL/min — ABNORMAL LOW (ref >60)
GFR calc non Af Amer: 38 mL/min — ABNORMAL LOW (ref >60)
Glucose, Bld: 107 mg/dL — ABNORMAL HIGH (ref 70–99)
Potassium: 4.2 mmol/L (ref 3.5–5.1)
Sodium: 131 mmol/L — ABNORMAL LOW (ref 135–145)

## 2019-12-02 LAB — URINE CULTURE: Culture: 30000 — AB

## 2019-12-02 MED ORDER — LISINOPRIL-HYDROCHLOROTHIAZIDE 20-25 MG PO TABS: 0.5000 | ORAL_TABLET | ORAL | 1 refills | Status: DC

## 2019-12-02 NOTE — Progress Notes (Signed)
CARDIAC REHAB PHASE I   PRE:  Rate/Rhythm: 61 SR    BP: sitting 112/59, standing 133/70    SaO2:   MODE:  Ambulation: 470 ft   POST:  Rate/Rhythm: 73 SR    BP: sitting 123/40     SaO2:   Pt sts she still feels weak but stronger than she was. Able to walk hall slowly with standby assist. No major c/o, BP and HR appropriate. Reviewed Brilinta, BB, walking for exercise, and CRPII. Will update CRPII. Pt voiced understanding. Left in recliner. Latty, ACSM 12/02/2019 8:56 AM

## 2019-12-02 NOTE — Progress Notes (Signed)
Primary Cardiologist:  Irish Lack  Subjective:  Only complaint this am is abdominal upset from IBS  Objective:  Vitals:   12/01/19 2235 12/02/19 0004 12/02/19 0443 12/02/19 0739  BP: (!) 116/42 (!) 103/45 (!) 118/59 135/72  Pulse: 70 (!) 55 71 62  Resp:  17 16 20   Temp:  98.7 F (37.1 C) 98.4 F (36.9 C) 97.9 F (36.6 C)  TempSrc:  Oral Oral Oral  SpO2:  91% 95% 100%  Weight:   65.5 kg   Height:        Intake/Output from previous day:  Intake/Output Summary (Last 24 hours) at 12/02/2019 1829 Last data filed at 12/02/2019 9371 Gross per 24 hour  Intake 1310.04 ml  Output 600 ml  Net 710.04 ml    Physical Exam:  Affect appropriate Healthy:  appears stated age HEENT: normal Neck supple with no adenopathy JVP normal no bruits no thyromegaly Lungs clear with no wheezing and good diaphragmatic motion Heart:  S1/S2 no murmur, no rub, gallop or click PMI normal Abdomen: benighn, BS positve, no tenderness, no AAA no bruit.  No HSM or HJR Distal pulses intact with no bruits No edema Neuro non-focal Skin warm and dry No muscular weakness   Lab Results: Basic Metabolic Panel: Recent Labs    12/01/19 0335 12/02/19 0426  NA 130* 131*  K 4.3 4.2  CL 96* 100  CO2 18* 21*  GLUCOSE 137* 107*  BUN 19 22  CREATININE 1.57* 1.30*  CALCIUM 9.3 8.5*   Liver Function Tests: Recent Labs    12/01/19 0430  AST 18  ALT 16  ALKPHOS 86  BILITOT 0.9  PROT 7.1  ALBUMIN 3.7   Recent Labs    12/01/19 0430  LIPASE 22   CBC: Recent Labs    11/29/19 1238 12/01/19 0335  WBC 9.4 12.6*  HGB 10.1* 11.0*  HCT 32.3* 35.0*  MCV 87 87.7  PLT 336 365   Thyroid Function Tests: Recent Labs    11/29/19 1238  TSH 0.055*  T4TOTAL 11.3  T3FREE 2.6     Imaging: DG Chest 2 View  Result Date: 12/01/2019 CLINICAL DATA:  Chest pain and nausea EXAM: CHEST - 2 VIEW COMPARISON:  10/26/2019 FINDINGS: Normal heart size and aortic contours. Moderate hiatal hernia with small fluid  level. Interstitial prominence that is stable from prior. There is no edema, consolidation, effusion, or pneumothorax. Coronary stenting seen on the lateral view. IMPRESSION: No evidence of acute disease. Electronically Signed   By: Monte Fantasia M.D.   On: 12/01/2019 04:27    Cardiac Studies:  ECG: NSR nonspecific ST changes    Telemetry:  NSR 12/02/2019   Echo: 10/27/19 EF 60-65%   Medications:   . aspirin EC  81 mg Oral Daily  . atorvastatin  80 mg Oral q1800  . enoxaparin (LOVENOX) injection  30 mg Subcutaneous Q24H  . levothyroxine  175 mcg Oral QAC breakfast  . metoprolol tartrate  12.5 mg Oral BID  . oxybutynin  10 mg Oral QHS  . sodium chloride flush  3 mL Intravenous Once  . ticagrelor  90 mg Oral BID     . sodium chloride 50 mL/hr at 12/02/19 6967    Assessment/Plan:   1. Chest Pain:  Atypical r/o no acute ECG changes Stent to RCA 10/26/19 with some residual distal RCA/PLB disease to be Rx medically  Continue medical Rx with DAT/statin and beta blocker Can add low dose nitrates as outpatient if needed TOC appointment needed  2. Urology:  Started on antibiotic as outpatient but d/c due to IBS worse UA benign on admission observe  3. IBS:  With one episode of diarrhea f/u primary   4. Thyroid:  On replacement TSH normal   5. HLD:  Continue statin target LDL 70 or less  .   Jenkins Rouge 12/02/2019, 8:37 AM

## 2019-12-02 NOTE — Plan of Care (Signed)

## 2019-12-02 NOTE — Care Management Obs Status (Signed)
[place] NOTIFICATION   Patient Details  Name: Abigail [name] MRN: [id number] Date of Birth: 03/09/1936   Medicare Observation Status Notification Given:  Yes    Zenon Mayo, RN 12/02/2019, 9:25 AM

## 2019-12-02 NOTE — Discharge Summary (Signed)
Discharge Summary    Patient ID: Abigail [name] MRN: [id number]; DOB: 1935/11/13  Admit date: 12/01/2019 Discharge date: 12/02/2019  Primary Care Provider: [name], FNP  Primary Cardiologist: Larae Grooms, MD  Primary Electrophysiologist:  None   Discharge Diagnoses    Principal Problem:   Chest pain with moderate risk for cardiac etiology Active Problems:   Essential hypertension   Urinary urgency   Allergies Allergies  Allergen Reactions  . Codeine Nausea And Vomiting  . Norvasc [Amlodipine] Rash and Other (See Comments)    Petechia    Diagnostic Studies/Procedures    None this admission _____________   History of Present Illness     Abigail [name] is a 84 y.o. female with  STEMI 10/26/2019 with DES RCA w/ med rx for residual dz distal RCA, EF nl, HTN, HLD, borderline DM, carotid dz, hypothyroid, diverticulitis.  She had been feeling bad for about a week, with weakness decreased activity level.  She also noted an elevated heart rate.  She had bradycardia as well, with a heart rate in the 40s, beta-blocker decreased.  She developed some diarrhea as well.  She developed chest pain on 4/7 and came to the hospital where she was admitted for further evaluation and treatment.  Hospital Course     Consultants: None  Her cardiac enzymes had a mild elevation, but flat, not consistent with ACS.  Her ECG was nonacute.  It was felt clear that her stent was patent.  She was continued on her preadmission meds of dual antiplatelet therapy, beta-blocker and statin.  She was monitored on telemetry.  She was continued on the low dose of metoprolol, she had some mild bradycardia in the 50s, but was asymptomatic.  Her creatinine was somewhat elevated, and her systolic blood pressure was in the 100s at times.  Her lisinopril HCT was held.  She was only taking 1/2 tablet/day.  Her creatinine improved overnight.  She can restart the lisinopril HCT at 1/2 tablet on Monday Wednesday  and Friday only, and follow-up as an outpatient.  Her TSH was low but free T3 and free T4 were normal.  She was continued on her home dose of levothyroxine.  Follow-up with PCP.  She describes urinary urgency.  A UA was performed and was not consistent with a UTI.  Follow-up with PCP for symptoms.  On 12/02/2019, she was seen by Dr Johnsie Cancel and all data were reviewed.  She had no further chest pain.  She is to continue current medical therapy for residual disease in the distal RCA and other vessels.  Consider adding low-dose nitrates as an outpatient if she has additional chest pain.  No further inpatient work-up is indicated and she is considered stable for discharge, to follow up as an outpatient as arranged.   Did the patient have an acute coronary syndrome (MI, NSTEMI, STEMI, etc) this admission?:  No                               Did the patient have a percutaneous coronary intervention (stent / angioplasty)?:  No.   _____________  Discharge Vitals Blood pressure 135/72, pulse 62, temperature 97.9 F (36.6 C), temperature source Oral, resp. rate 20, height 5\' 2"  (1.575 m), weight 65.5 kg, SpO2 100 %.  Filed Weights   12/01/19 1850 12/02/19 0443  Weight: 66 kg 65.5 kg    Labs & Radiologic Studies    CBC Recent Labs  11/29/19 1238 12/01/19 0335  WBC 9.4 12.6*  HGB 10.1* 11.0*  HCT 32.3* 35.0*  MCV 87 87.7  PLT 336 154   Basic Metabolic Panel Recent Labs    12/01/19 0335 12/02/19 0426  NA 130* 131*  K 4.3 4.2  CL 96* 100  CO2 18* 21*  GLUCOSE 137* 107*  BUN 19 22  CREATININE 1.57* 1.30*  CALCIUM 9.3 8.5*   Liver Function Tests Recent Labs    12/01/19 0430  AST 18  ALT 16  ALKPHOS 86  BILITOT 0.9  PROT 7.1  ALBUMIN 3.7   Recent Labs    12/01/19 0430  LIPASE 22   High Sensitivity Troponin:   Recent Labs  Lab 12/01/19 0335 12/01/19 0430 12/01/19 0725  TROPONINIHS 21* 21* 21*    BNP Invalid input(s): POCBNP D-Dimer No results for input(s):  DDIMER in the last 72 hours. Hemoglobin A1C No results for input(s): HGBA1C in the last 72 hours. Fasting Lipid Panel No results for input(s): CHOL, HDL, LDLCALC, TRIG, CHOLHDL, LDLDIRECT in the last 72 hours. Thyroid Function Tests Recent Labs    11/29/19 1238  TSH 0.055*  T4TOTAL 11.3  T3FREE 2.6   Urinalysis    Component Value Date/Time   COLORURINE YELLOW 12/01/2019 1148   APPEARANCEUR CLEAR 12/01/2019 1148   LABSPEC 1.008 12/01/2019 1148   PHURINE 6.0 12/01/2019 1148   GLUCOSEU NEGATIVE 12/01/2019 1148   HGBUR SMALL (A) 12/01/2019 1148   BILIRUBINUR NEGATIVE 12/01/2019 1148   BILIRUBINUR negative 11/29/2019 1655   KETONESUR 5 (A) 12/01/2019 1148   PROTEINUR NEGATIVE 12/01/2019 1148   UROBILINOGEN 0.2 11/29/2019 1655   UROBILINOGEN 0.2 04/05/2012 1606   NITRITE NEGATIVE 12/01/2019 1148   LEUKOCYTESUR SMALL (A) 12/01/2019 1148     _____________  DG Chest 2 View  Result Date: 12/01/2019 CLINICAL DATA:  Chest pain and nausea EXAM: CHEST - 2 VIEW COMPARISON:  10/26/2019 FINDINGS: Normal heart size and aortic contours. Moderate hiatal hernia with small fluid level. Interstitial prominence that is stable from prior. There is no edema, consolidation, effusion, or pneumothorax. Coronary stenting seen on the lateral view. IMPRESSION: No evidence of acute disease. Electronically Signed   By: Monte Fantasia M.D.   On: 12/01/2019 04:27   Disposition   Pt is being discharged home today in good condition.  Follow-up Plans & Appointments     Discharge Instructions    Diet - low sodium heart healthy   Complete by: As directed    Increase activity slowly   Complete by: As directed       Discharge Medications   Allergies as of 12/02/2019      Reactions   Codeine Nausea And Vomiting   Norvasc [amlodipine] Rash, Other (See Comments)   Petechia      Medication List    STOP taking these medications   nitrofurantoin (macrocrystal-monohydrate) 100 MG capsule Commonly  known as: Macrobid     TAKE these medications   aspirin EC 81 MG tablet Take 81 mg by mouth daily.   atorvastatin 80 MG tablet Commonly known as: LIPITOR Take 1 tablet (80 mg total) by mouth daily at 6 PM.   busPIRone 5 MG tablet Commonly known as: BUSPAR Take 5 mg by mouth daily as needed (anxiety).   Cranberry 1000 MG Caps Take 1 capsule by mouth daily at 12 noon.   docusate sodium 100 MG capsule Commonly known as: COLACE Take 200-300 mg by mouth daily as needed for mild constipation.   levothyroxine 175  MCG tablet Commonly known as: SYNTHROID Take 1 tablet (175 mcg total) by mouth daily before breakfast. Take 1 tablet by mouth every day in the am What changed: additional instructions   lisinopril-hydrochlorothiazide 20-25 MG tablet Commonly known as: ZESTORETIC Take 0.5 tablets by mouth 3 (three) times a week. Take on Monday Wednesday and Friday only What changed:   when to take this  additional instructions   metoprolol tartrate 25 MG tablet Commonly known as: LOPRESSOR Take 0.5 tablets (12.5 mg total) by mouth 2 (two) times daily.   nitroGLYCERIN 0.4 MG SL tablet Commonly known as: NITROSTAT Place 1 tablet (0.4 mg total) under the tongue every 5 (five) minutes x 3 doses as needed for chest pain.   oxybutynin 10 MG 24 hr tablet Commonly known as: DITROPAN-XL TAKE 1 TABLET BY MOUTH EVERYDAY AT BEDTIME What changed: See the new instructions.   ticagrelor 90 MG Tabs tablet Commonly known as: BRILINTA Take 1 tablet (90 mg total) by mouth 2 (two) times daily.          Outstanding Labs/Studies   None  Duration of Discharge Encounter   Greater than 30 minutes including physician time.  Signed, Rosaria Ferries, PA-C 12/02/2019, 10:57 AM

## 2019-12-02 NOTE — TOC Progression Note (Signed)
Transition of Care [facility]) - Progression Note    Patient Details  Name: Abigail [name] MRN: [id number] Date of Birth: 15-Apr-1936  [facility] CM/SW Contact  Zenon Mayo, RN Phone Number: 12/02/2019, 9:26 AM  Clinical Narrative:    Patient for dc today, she states she may need assistance with transportation, she will let us know.  She lives alone.          Expected Discharge Plan and Services           Expected Discharge Date: 12/02/19                                     Social Determinants of Health (SDOH) Interventions    Readmission Risk Interventions No flowsheet data found.

## 2019-12-02 NOTE — Progress Notes (Signed)
Discharge instructions given to patient,she received handout on low salt foods,where to go to for sample menu's etc. PIV removed with catheter intact dry dressing applied. Tele removed CCMD informed. Son informed to pick patient up for discharge reported on his way. Patient is getting dressed for discharge. No further changes noted.

## 2019-12-02 NOTE — Progress Notes (Signed)
MD paged regarding completing AVS reconciliation for meds so AVS can be printed for patient's discharge await response.

## 2019-12-02 NOTE — Progress Notes (Signed)
Patient taken out in wheelchair for home.

## 2019-12-04 NOTE — Progress Notes (Signed)
See previous note about her urine culture we did in the office

## 2019-12-05 ENCOUNTER — Ambulatory Visit: Payer: Self-pay

## 2019-12-05 DIAGNOSIS — I2111 ST elevation (STEMI) myocardial infarction involving right coronary artery: Secondary | ICD-10-CM

## 2019-12-05 DIAGNOSIS — N183 Chronic kidney disease, stage 3 unspecified: Secondary | ICD-10-CM

## 2019-12-05 DIAGNOSIS — R079 Chest pain, unspecified: Secondary | ICD-10-CM

## 2019-12-05 DIAGNOSIS — I129 Hypertensive chronic kidney disease with stage 1 through stage 4 chronic kidney disease, or unspecified chronic kidney disease: Secondary | ICD-10-CM

## 2019-12-05 NOTE — Chronic Care Management (AMB) (Signed)
  Care Management    Consult Note  12/05/2019 Name: Abigail [name] MRN: [id number] DOB: 1936-06-28  Care management team received notification of patient's recent inpatient hospitalization related to chest pain .Based on review of health record, Abigail [name] is currently active in the embedded care coordination program.. Outbound call placed to the patient to assist with care coordination needs.   Review of patient status, including review of consultants reports, relevant laboratory and other test results, and collaboration with appropriate care team members and the patient's provider was performed as part of comprehensive patient evaluation and provision of chronic care management services.    SW placed a successful outbound call to the patient to follow up on care coordination needs. The patient reports she is still feeling "short winded" but is not experiencing chest pain. The patient stated her [facility] ended on Wednesday April 7th and she was instructed to follow up with cardiac rehab. Upon chart review it is noted the patient was inpatient on 10/26/19 for a STEMI and was recommended to follow up with cardiac rehab clinic. The patient was contacted by cardiac rehab on April 8th to schedule appointment but was inpatient due to chest pain. SW attempted to assist the patient in contacting cardiac rehab during today's call but the patient declined stating she was trying to get her house in order and would contact the clinic later this week.  Plan: Collaboration with RN Care Manager regarding patient update. Advised the patient RN Care Manager would follow up over the next two weeks. Encouraged the patient to contact the care management team is assistance is needed in cardiac clinic scheduling.  Daneen Schick, BSW, CDP Social Worker, Certified Dementia Practitioner [facility] [phone]

## 2019-12-07 ENCOUNTER — Telehealth: Payer: Self-pay

## 2019-12-07 NOTE — Telephone Encounter (Signed)
Transition Care Management Follow-up Telephone Call  Date of discharge and from where: [place] 12/02/19  How have you been since you were released from the hospital? she's been ok  Any questions or concerns? no  Items Reviewed:  Did the pt receive and understand the discharge instructions provided? yes  Medications obtained and verified? Lisinopril mon,wed and friday  Any new allergies since your discharge?no  Dietary orders reviewed? 2000 grams   Do you have support at home? Yes, a friend comes over   Other (ie: DME, Home Health, etc)none   Functional Questionnaire: (I = Independent and D = Dependent) ADL's: i  Bathing/Dressing- i   Meal Prep-i  Eating- i  Maintaining continence- i  Transferring/Ambulation- i  Managing Meds- i   Follow up appointments reviewed:    PCP Hospital f/u appt confirmed?  Scheduled to see [name] DNP,FNP-BC  on 12/13/19 @ 2:15pm  Specialist Hospital f/u appt confirmed?  Cardiologist unsure of appt.  Are transportation arrangements needed? no  If their condition worsens, is the pt aware to call  their PCP or go to the ED? yes  Was the patient provided with contact information for the PCP's office or ED? yes  Was the pt encouraged to call back with questions or concerns? yes

## 2019-12-10 ENCOUNTER — Ambulatory Visit: Payer: PPO | Attending: Internal Medicine

## 2019-12-10 DIAGNOSIS — Z23 Encounter for immunization: Secondary | ICD-10-CM

## 2019-12-10 NOTE — Progress Notes (Signed)
   Covid-19 Vaccination Clinic  Name:  Abigail [name]    MRN: [id number] DOB: [birth year]  12/10/2019  [name] was observed post Covid-19 immunization for 15 minutes without incident. She was provided with Vaccine Information Sheet and instruction to access the V-Safe system.   [name] was instructed to call 911 with any severe reactions post vaccine: [name] Kitchen Difficulty breathing  . Swelling of face and throat  . A fast heartbeat  . A bad rash all over body  . Dizziness and weakness   Immunizations Administered    Name Date Dose VIS Date Route   Pfizer COVID-19 Vaccine 12/10/2019  9:55 AM 0.3 mL 08/05/2019 Intramuscular   Manufacturer: [place]   Lot: [id number]   [place]: [id number]

## 2019-12-13 ENCOUNTER — Encounter: Payer: Self-pay | Admitting: Nurse Practitioner

## 2019-12-13 ENCOUNTER — Ambulatory Visit: Payer: Self-pay | Admitting: Nurse Practitioner

## 2019-12-13 ENCOUNTER — Other Ambulatory Visit: Payer: Self-pay

## 2019-12-13 ENCOUNTER — Ambulatory Visit (INDEPENDENT_AMBULATORY_CARE_PROVIDER_SITE_OTHER): Payer: PPO | Admitting: Nurse Practitioner

## 2019-12-13 VITALS — BP 122/70 | HR 52 | Temp 97.8°F | Ht 61.0 in | Wt 145.6 lb

## 2019-12-13 DIAGNOSIS — N183 Chronic kidney disease, stage 3 unspecified: Secondary | ICD-10-CM | POA: Diagnosis not present

## 2019-12-13 DIAGNOSIS — R001 Bradycardia, unspecified: Secondary | ICD-10-CM | POA: Diagnosis not present

## 2019-12-13 DIAGNOSIS — N390 Urinary tract infection, site not specified: Secondary | ICD-10-CM | POA: Diagnosis not present

## 2019-12-13 DIAGNOSIS — I129 Hypertensive chronic kidney disease with stage 1 through stage 4 chronic kidney disease, or unspecified chronic kidney disease: Secondary | ICD-10-CM

## 2019-12-13 NOTE — Progress Notes (Signed)
This visit occurred during the SARS-CoV-2 public health emergency.  Safety protocols were in place, including screening questions prior to the visit, additional usage of staff PPE, and extensive cleaning of exam room while observing appropriate contact time as indicated for disinfecting solutions.  Subjective:     Patient ID: Abigail [name] , female    DOB: 06-Nov-1935 , 84 y.o.   MRN: [id number]   Chief Complaint  Patient presents with  . Hospitalization Follow-up    HPI  She is here today for hospital follow up after having worsening weakness after taking an antibiotic for UTI and she had a low heart rate down to 40's. She reports she had an increased amount of diarrhea and when talking with her health nurse she was advised to go to ER.  She was advised by heart care to only take her lisinopril three days a week. She feels her "expansion" is not as good as it could be.  She did have PT after her first hospitalization.  She reports the PT recommended cardiac PT.    She had her covid vaccine this weekend. She had a sore arm but other than that she was fine.   She had been feeling bad for about a week, with weakness decreased activity level.  She also noted an elevated heart rate.  She had bradycardia as well, with a heart rate in the 40s, beta-blocker decreased.  She developed some diarrhea as well.  She developed chest pain on 4/7 and came to the hospital where she was admitted for further evaluation and treatment.    Past Medical History:  Diagnosis Date  . Carotid artery occlusion   . Complication of anesthesia    hard to wake up per pt  . Diverticulitis   . Hypertension   . Hypothyroid   . STEMI (ST elevation myocardial infarction) ([place]) 10/26/2019   DES RCA     Family History  Problem Relation Age of Onset  . Hypertension Mother      Current Outpatient Medications:  .  aspirin EC 81 MG tablet, Take 81 mg by mouth daily., Disp: , Rfl:  .  atorvastatin (LIPITOR) 80 MG  tablet, Take 1 tablet (80 mg total) by mouth daily at 6 PM., Disp: 90 tablet, Rfl: 3 .  busPIRone (BUSPAR) 5 MG tablet, Take 5 mg by mouth daily as needed (anxiety). , Disp: , Rfl:  .  Cranberry 1000 MG CAPS, Take 1 capsule by mouth daily at 12 noon., Disp: , Rfl:  .  docusate sodium (COLACE) 100 MG capsule, Take 200-300 mg by mouth daily as needed for mild constipation. , Disp: , Rfl:  .  levothyroxine (SYNTHROID) 175 MCG tablet, Take 1 tablet (175 mcg total) by mouth daily before breakfast. Take 1 tablet by mouth every day in the am (Patient taking differently: Take 175 mcg by mouth daily before breakfast. Do not take medicine on sundays), Disp: 45 tablet, Rfl: 1 .  lisinopril-hydrochlorothiazide (ZESTORETIC) 20-25 MG tablet, Take 0.5 tablets by mouth 3 (three) times a week. Take on Monday Wednesday and Friday only, Disp: 90 tablet, Rfl: 1 .  metoprolol tartrate (LOPRESSOR) 25 MG tablet, Take 0.5 tablets (12.5 mg total) by mouth 2 (two) times daily., Disp: 180 tablet, Rfl: 3 .  nitroGLYCERIN (NITROSTAT) 0.4 MG SL tablet, Place 1 tablet (0.4 mg total) under the tongue every 5 (five) minutes x 3 doses as needed for chest pain., Disp: 25 tablet, Rfl: 3 .  oxybutynin (DITROPAN-XL) 10 MG 24  hr tablet, TAKE 1 TABLET BY MOUTH EVERYDAY AT BEDTIME (Patient taking differently: Take 10 mg by mouth at bedtime. ), Disp: 90 tablet, Rfl: 1 .  ticagrelor (BRILINTA) 90 MG TABS tablet, Take 1 tablet (90 mg total) by mouth 2 (two) times daily., Disp: 180 tablet, Rfl: 3  Current Facility-Administered Medications:  .  Ring Pessary/Support MISC 1 each, 1 each, Does not apply, Once, Anyanwu, Sallyanne Havers, MD   Allergies  Allergen Reactions  . Codeine Nausea And Vomiting  . Norvasc [Amlodipine] Rash and Other (See Comments)    Petechia     Review of Systems  Constitutional: Negative.   Respiratory: Negative.   Cardiovascular: Negative.  Negative for chest pain, palpitations and leg swelling.  Gastrointestinal:  Negative.   Neurological: Negative for dizziness and headaches.  Psychiatric/Behavioral: Negative.      Today's Vitals   12/13/19 1415  BP: 122/70  Pulse: (!) 52  Temp: 97.8 F (36.6 C)  TempSrc: Oral  Weight: 145 lb 9.6 oz (66 kg)  Height: 5\' 1"  (1.549 m)  PainSc: 0-No pain   Body mass index is 27.51 kg/m.   Objective:  Physical Exam Constitutional:      Appearance: Normal appearance.  Cardiovascular:     Rate and Rhythm: Normal rate and regular rhythm.     Pulses: Normal pulses.     Heart sounds: Normal heart sounds. No murmur.  Pulmonary:     Effort: Pulmonary effort is normal. No respiratory distress.     Breath sounds: Normal breath sounds.  Neurological:     General: No focal deficit present.     Mental Status: She is alert and oriented to person, place, and time.  Psychiatric:        Mood and Affect: Mood normal.        Behavior: Behavior normal.        Thought Content: Thought content normal.        Judgment: Judgment normal.         Assessment And Plan:     1. Benign hypertension with CKD (chronic kidney disease) stage III  Chronic, good control  Continue with current medications  2. Bradycardia  Likely related to the metoprolol  She is to follow up with Cardiology  3. Urinary tract infection without hematuria, site unspecified  She had a positive culture at her last visit but was unable to tolerate the antibiotic, admitted to the hospital for increased fatigue and weakness after having diarrhea  Will recheck urine culture to make sure is better. Denies any symptoms. - Culture, Urine  [name], FNP    THE PATIENT IS ENCOURAGED TO PRACTICE SOCIAL DISTANCING DUE TO THE COVID-19 PANDEMIC.

## 2019-12-13 NOTE — Patient Instructions (Signed)
   Take a multivitamin daily  Take calcium 1200 mg daily  Take at least 3,000 mcg vitamin d daily

## 2019-12-14 ENCOUNTER — Telehealth: Payer: Self-pay

## 2019-12-14 ENCOUNTER — Ambulatory Visit (INDEPENDENT_AMBULATORY_CARE_PROVIDER_SITE_OTHER): Payer: PPO

## 2019-12-14 DIAGNOSIS — E039 Hypothyroidism, unspecified: Secondary | ICD-10-CM | POA: Diagnosis not present

## 2019-12-14 DIAGNOSIS — N183 Chronic kidney disease, stage 3 unspecified: Secondary | ICD-10-CM

## 2019-12-14 DIAGNOSIS — I129 Hypertensive chronic kidney disease with stage 1 through stage 4 chronic kidney disease, or unspecified chronic kidney disease: Secondary | ICD-10-CM

## 2019-12-14 DIAGNOSIS — F419 Anxiety disorder, unspecified: Secondary | ICD-10-CM

## 2019-12-14 DIAGNOSIS — R7303 Prediabetes: Secondary | ICD-10-CM

## 2019-12-14 DIAGNOSIS — I10 Essential (primary) hypertension: Secondary | ICD-10-CM

## 2019-12-15 LAB — URINE CULTURE

## 2019-12-16 NOTE — Chronic Care Management (AMB) (Signed)
Chronic Care Management   Follow Up Note   12/15/2019 Name: Abigail [name] MRN: [id number] DOB: 30-Apr-1936  Referred by: [name], FNP Reason for referral : Chronic Care Management (FU RN CM Call )   Abigail [name] is a 84 y.o. year old female who is a primary care patient of [name], [place]. The CCM team was consulted for assistance with chronic disease management and care coordination needs.    Review of patient status, including review of consultants reports, relevant laboratory and other test results, and collaboration with appropriate care team members and the patient's provider was performed as part of comprehensive patient evaluation and provision of chronic care management services.    SDOH (Social Determinants of Health) assessments performed: Yes See Care Plan activities for detailed interventions related to [place])   Placed RN CM outbound follow up call to patient for a CCM update, care plan updated.     Outpatient Encounter Medications as of 12/14/2019  Medication Sig Note  . levothyroxine (SYNTHROID) 175 MCG tablet Take 1 tablet (175 mcg total) by mouth daily before breakfast. Take 1 tablet by mouth every day in the am (Patient taking differently: Take 175 mcg by mouth daily before breakfast. Do not take medicine on sundays)   . aspirin EC 81 MG tablet Take 81 mg by mouth daily.   [name] Kitchen atorvastatin (LIPITOR) 80 MG tablet Take 1 tablet (80 mg total) by mouth daily at 6 PM.   . busPIRone (BUSPAR) 5 MG tablet Take 5 mg by mouth daily as needed (anxiety).    . Cranberry 1000 MG CAPS Take 1 capsule by mouth daily at 12 noon.   . docusate sodium (COLACE) 100 MG capsule Take 200-300 mg by mouth daily as needed for mild constipation.    [name] Kitchen lisinopril-hydrochlorothiazide (ZESTORETIC) 20-25 MG tablet Take 0.5 tablets by mouth 3 (three) times a week. Take on Monday Wednesday and Friday only   . metoprolol tartrate (LOPRESSOR) 25 MG tablet Take 0.5 tablets (12.5 mg total) by mouth 2 (two)  times daily. 12/01/2019: Pt can't remember if she took this yesterday because she went to the Dr and the nurse called her at home and told her to stop taking it.   . nitroGLYCERIN (NITROSTAT) 0.4 MG SL tablet Place 1 tablet (0.4 mg total) under the tongue every 5 (five) minutes x 3 doses as needed for chest pain. 12/01/2019: Pt took 2 tablets before coming to ED.   [name] Kitchen oxybutynin (DITROPAN-XL) 10 MG 24 hr tablet TAKE 1 TABLET BY MOUTH EVERYDAY AT BEDTIME (Patient taking differently: Take 10 mg by mouth at bedtime. )   . ticagrelor (BRILINTA) 90 MG TABS tablet Take 1 tablet (90 mg total) by mouth 2 (two) times daily.    Facility-Administered Encounter Medications as of 12/14/2019  Medication  . Ring Pessary/Support MISC 1 each     Objective:  Lab Results  Component Value Date   HGBA1C 5.8 (H) 10/26/2019   HGBA1C 5.8 (H) 06/30/2019   HGBA1C 5.8 (H) 06/25/2018   Lab Results  Component Value Date   LDLCALC 110 (H) 10/26/2019   CREATININE 1.30 (H) 12/02/2019   BP Readings from Last 3 Encounters:  12/13/19 122/70  12/02/19 135/72  11/29/19 102/62    Goals Addressed      Patient Stated   . "I am suppose to start Cardiac Rehab" (pt-stated)       CARE PLAN ENTRY (see longitudinal plan of care for additional care plan information)  Current Barriers:  .  Knowledge Deficits related to disease process and Self Health management s/p STEMI 10/26/2019  . Chronic Disease Management support and education needs related to Acquired hypothyroidism, Anxiety, Essential hypertension, Prediabetes, Hyperlipidemia  Nurse Case Manager Clinical Goal(s):  [name] Kitchen Over the next 90 days, patient will work with CCM RN CM and PCP to address needs related to disease education and support for improved Self Health management of s/p STEMI involving right coronary artery   CCM RN CM Interventions:  12/15/19 call completed with patient  . Inter-disciplinary care team collaboration (see longitudinal plan of care) . Evaluation  of current treatment plan related to CAD, s/p STEMI with right coronary artery occlusion and patient's adherence to plan as established by provider . Assessed for knowledge and understanding of recent discharge following STEMI and recommendations for post d/c follow up . Assessed for knowledge and understanding of reoccurring s/s suggestive of MI and or decreased Cardiac perfusion  . Advised patient to f/u with her Cardiologist as recommended, discussed OV scheduled with Dr. Irish Lack scheduled for 01/09/20 @9 :20 AM  . Reviewed medications with patient and discussed indication, dosage and frequency of prescribed medications; patient reports adherence; reports having financial hardship with cost Brilinta . Discussed plans with patient for ongoing care management follow up and provided patient with direct contact information for care management team . Pharmacy referral for assistance with medication cost of Brilinta   Patient Self Care Activities:  . Self administers medications as prescribed . Attends all scheduled provider appointments . Calls pharmacy for medication refills . Performs ADL's independently . Performs IADL's independently . Calls provider office for new concerns or questions  Initial goal documentation     . COMPLETED: "I need to get my COVID vaccines" (pt-stated)       Current Barriers:  [name] Kitchen Knowledge Deficits related to COVID 19 vaccination . Chronic Disease Management support and education needs related to Anxiety secondary to fear of contracting the COVID-19 virus; Acquired hypothyroidism, Essential Hypertension, Pre-diabetes, Hyperlipidemia  Nurse Case Manager Clinical Goal(s):  [name] Kitchen Over the next 30 days, patient will verbalize understanding of plan for receiving the COVID-19 vaccination   CCM RN CM Interventions:  12/15/19 call completed with patient . Evaluation of current treatment plan related to COVID 19 vaccine and patient's adherence to plan as established by  provider. . Determined patient received her first COVID vaccine w/o SE; Discussed her 2nd COVID vaccine has been scheduled and she will update her PCP at next OV with her vaccine dates . Determined patient has no further questions or concerns at this time regarding the COVID vaccines  . Discussed plans with patient for ongoing care management follow up and provided patient with direct contact information for care management team  Patient Self Care Activities:  . Patient verbalizes understanding of plan to contact the [facility] department to schedule a COVID 19 vaccine . Self administers medications as prescribed . Attends all scheduled provider appointments . Calls pharmacy for medication refills . Performs ADL's independently . Performs IADL's independently . Calls provider office for new concerns or questions  Please see past updates related to this goal by clicking on the "Past Updates" button in the selected goal     . COMPLETED: "I would like to learn more about home delivered meals and food resources" (pt-stated)       Current Barriers:  [name] Kitchen Knowledge Deficits related to eligibility for home delivered meals and food resources . Lacks caregiver support.  . Film/video editor.   Nurse Case  Manager Clinical Goal(s):  [name] Kitchen Over the next 45 days, patient will work with embedded BSW Daneen Schick to address needs related to home delivered meals and food resources  CCM RN CM Interventions:  12/15/19 call completed with patient  . Determined patient is no longer interested in receiving home delivered meals . Determined she is able to prepare her own meals and would like to continue to do so at this time . Encouraged patient to let the CCM team know if she and when she would like to start receiving home delivered meals . Discussed plans with patient for ongoing care management follow up and provided patient with direct contact information for care management team  Patient Self  Care Activities:  . Self administers medications as prescribed . Attends all scheduled provider appointments . Calls pharmacy for medication refills . Performs ADL's independently . Performs IADL's independently . Calls provider office for new concerns or questions  Please see past updates related to this goal by clicking on the "Past Updates" button in the selected goal      . "to learn more about how to manage my chronic conditions' (pt-stated)       Current Barriers:  [name] Kitchen Knowledge Deficits related to disease process and Self Health management of Hypothyroidism . Chronic Disease Management support and education needs related to Acquired Hypothyroidism, Essential Hypertension, Pre-diabetes, Hyperlipidemia  Nurse Case Manager Clinical Goal(s):  [name] Kitchen Over the next 90 days, patient will work with the [facility] CM and PCP to address needs related to disease education and support for Acquired hypothyroidism   CCM RN CM Interventions:  12/15/19 call completed with patient  . Evaluation of current treatment plan related to Acquired hypothyroidism and patient's adherence to plan as established by provider . Discussed patient's abnormal TSH obtained 2 weeks ago during hospital admission  . Determined how patient is taking her Synthroid and confirmed recent dosage change; Synthroid 175 mg am before breakfast except on Sunday's . Discussed next scheduled OV with PCP at which time her TSH will be rechecked . Determined patient is experiencing increased fatigue; Educated patient on symptoms suggestive of thyroid disease including fatigue . Encouraged patient to continue to balance her activity with plenty of rest and educated on importance of drinking plenty of water  . Encouraged patient to keep her PCP well informed of new or worsening symptoms that may be related to her thyroid condition  . Discussed plans with patient for ongoing care management follow up and provided patient with direct contact  information for care management team  Patient Self Care Activities:  . Self administers medications as prescribed . Attends all scheduled provider appointments . Calls pharmacy for medication refills . Performs ADL's independently . Performs IADL's independently . Calls provider office for new concerns or questions  Please see past updates related to this goal by clicking on the "Past Updates" button in the selected goal         Plan:   Telephone follow up appointment with care management team member scheduled for: 01/13/20  Barb Merino, RN, BSN, CCM Care Management Coordinator [facility]  Direct Phone: [phone]

## 2019-12-16 NOTE — Patient Instructions (Signed)
Visit Information  Goals Addressed      Patient Stated   . "I am suppose to start Cardiac Rehab" (pt-stated)       CARE PLAN ENTRY (see longitudinal plan of care for additional care plan information)  Current Barriers:  [name] Kitchen Knowledge Deficits related to disease process and Self Health management s/p STEMI 10/26/2019  . Chronic Disease Management support and education needs related to Acquired hypothyroidism, Anxiety, Essential hypertension, Prediabetes, Hyperlipidemia  Nurse Case Manager Clinical Goal(s):  [name] Kitchen Over the next 90 days, patient will work with CCM RN CM and PCP to address needs related to disease education and support for improved Self Health management of s/p STEMI involving right coronary artery   CCM RN CM Interventions:  12/15/19 call completed with patient  . Inter-disciplinary care team collaboration (see longitudinal plan of care) . Evaluation of current treatment plan related to CAD, s/p STEMI with right coronary artery occlusion and patient's adherence to plan as established by provider . Assessed for knowledge and understanding of recent discharge following STEMI and recommendations for post d/c follow up . Assessed for knowledge and understanding of reoccurring s/s suggestive of MI and or decreased Cardiac perfusion  . Advised patient to f/u with her Cardiologist as recommended, discussed OV scheduled with Dr. Irish Lack scheduled for 01/09/20 @9 :20 AM  . Reviewed medications with patient and discussed indication, dosage and frequency of prescribed medications; patient reports adherence; reports having financial hardship with cost Brilinta . Discussed plans with patient for ongoing care management follow up and provided patient with direct contact information for care management team . Pharmacy referral for assistance with medication cost of Brilinta   Patient Self Care Activities:  . Self administers medications as prescribed . Attends all scheduled provider  appointments . Calls pharmacy for medication refills . Performs ADL's independently . Performs IADL's independently . Calls provider office for new concerns or questions  Initial goal documentation     . COMPLETED: "I need to get my COVID vaccines" (pt-stated)       Current Barriers:  [name] Kitchen Knowledge Deficits related to COVID 19 vaccination . Chronic Disease Management support and education needs related to Anxiety secondary to fear of contracting the COVID-19 virus; Acquired hypothyroidism, Essential Hypertension, Pre-diabetes, Hyperlipidemia  Nurse Case Manager Clinical Goal(s):  [name] Kitchen Over the next 30 days, patient will verbalize understanding of plan for receiving the COVID-19 vaccination   CCM RN CM Interventions:  12/15/19 call completed with patient . Evaluation of current treatment plan related to COVID 19 vaccine and patient's adherence to plan as established by provider. . Determined patient received her first COVID vaccine w/o SE; Discussed her 2nd COVID vaccine has been scheduled and she will update her PCP at next OV with her vaccine dates . Determined patient has no further questions or concerns at this time regarding the COVID vaccines  . Discussed plans with patient for ongoing care management follow up and provided patient with direct contact information for care management team  Patient Self Care Activities:  . Patient verbalizes understanding of plan to contact the [facility] department to schedule a COVID 19 vaccine . Self administers medications as prescribed . Attends all scheduled provider appointments . Calls pharmacy for medication refills . Performs ADL's independently . Performs IADL's independently . Calls provider office for new concerns or questions  Please see past updates related to this goal by clicking on the "Past Updates" button in the selected goal     . COMPLETED: "I would  like to learn more about home delivered meals and food resources"  (pt-stated)       Current Barriers:  [name] Kitchen Knowledge Deficits related to eligibility for home delivered meals and food resources . Lacks caregiver support.  . Film/video editor.   Nurse Case Manager Clinical Goal(s):  [name] Kitchen Over the next 45 days, patient will work with embedded BSW Daneen Schick to address needs related to home delivered meals and food resources  CCM RN CM Interventions:  12/15/19 call completed with patient  . Determined patient is no longer interested in receiving home delivered meals . Determined she is able to prepare her own meals and would like to continue to do so at this time . Encouraged patient to let the CCM team know if she and when she would like to start receiving home delivered meals . Discussed plans with patient for ongoing care management follow up and provided patient with direct contact information for care management team  Patient Self Care Activities:  . Self administers medications as prescribed . Attends all scheduled provider appointments . Calls pharmacy for medication refills . Performs ADL's independently . Performs IADL's independently . Calls provider office for new concerns or questions  Please see past updates related to this goal by clicking on the "Past Updates" button in the selected goal      . "to learn more about how to manage my chronic conditions' (pt-stated)       Current Barriers:  [name] Kitchen Knowledge Deficits related to disease process and Self Health management of Hypothyroidism . Chronic Disease Management support and education needs related to Acquired Hypothyroidism, Essential Hypertension, Pre-diabetes, Hyperlipidemia  Nurse Case Manager Clinical Goal(s):  [name] Kitchen Over the next 90 days, patient will work with the [facility] CM and PCP to address needs related to disease education and support for Acquired hypothyroidism   CCM RN CM Interventions:  12/15/19 call completed with patient  . Evaluation of current treatment plan related to  Acquired hypothyroidism and patient's adherence to plan as established by provider . Discussed patient's abnormal TSH obtained 2 weeks ago during hospital admission  . Determined how patient is taking her Synthroid and confirmed recent dosage change; Synthroid 175 mg am before breakfast except on Sunday's . Discussed next scheduled OV with PCP at which time her TSH will be rechecked . Determined patient is experiencing increased fatigue; Educated patient on symptoms suggestive of thyroid disease including fatigue . Encouraged patient to continue to balance her activity with plenty of rest and educated on importance of drinking plenty of water  . Encouraged patient to keep her PCP well informed of new or worsening symptoms that may be related to her thyroid condition  . Discussed plans with patient for ongoing care management follow up and provided patient with direct contact information for care management team  Patient Self Care Activities:  . Self administers medications as prescribed . Attends all scheduled provider appointments . Calls pharmacy for medication refills . Performs ADL's independently . Performs IADL's independently . Calls provider office for new concerns or questions  Please see past updates related to this goal by clicking on the "Past Updates" button in the selected goal         Patient verbalizes understanding of instructions provided today.   Telephone follow up appointment with care management team member scheduled for: 01/13/20  Barb Merino, RN, BSN, CCM Care Management Coordinator [facility]  Direct Phone: [phone]

## 2019-12-28 ENCOUNTER — Encounter: Payer: Self-pay | Admitting: Nurse Practitioner

## 2020-01-02 ENCOUNTER — Ambulatory Visit: Payer: PPO | Attending: Internal Medicine

## 2020-01-02 DIAGNOSIS — Z23 Encounter for immunization: Secondary | ICD-10-CM

## 2020-01-02 NOTE — Progress Notes (Signed)
   Covid-19 Vaccination Clinic  Name:  Abigail [name]    MRN: [id number] DOB: 11-26-1935  01/02/2020  [name] was observed post Covid-19 immunization for 15 minutes without incident. She was provided with Vaccine Information Sheet and instruction to access the V-Safe system.   [name] was instructed to call 911 with any severe reactions post vaccine: [name] Kitchen Difficulty breathing  . Swelling of face and throat  . A fast heartbeat  . A bad rash all over body  . Dizziness and weakness   Immunizations Administered    Name Date Dose VIS Date Route   Pfizer COVID-19 Vaccine 01/02/2020  9:30 AM 0.3 mL 10/19/2018 Intramuscular   Manufacturer: [place]   Lot: [id number]   [place]: [address]-6

## 2020-01-06 NOTE — Progress Notes (Addendum)
Cardiology Office Note   Date:  01/09/2020   ID:  Abigail [name], DOB 10-03-1935, MRN [id number]  PCP:  Abigail Brine, FNP    No chief complaint on file.  CAD  Wt Readings from Last 3 Encounters:  01/09/20 146 lb (66.2 kg)  12/13/19 145 lb 9.6 oz (66 kg)  12/02/19 144 lb 6.4 oz (65.5 kg)       History of Present Illness: Abigail [name] is a 84 y.o. female who has had presyncope, hyponatremia in the past.  I saw her in 10/2018 for these issues.   In March 2021, she presented with an ST elevation MI.  Cath report showed:  "A drug-eluting stent was successfully placed using a SYNERGY XD 2.75X24.  Post intervention, there is a 0% residual stenosis.   1.  Severe single-vessel coronary artery disease with a proximal RCA culprit lesion, treated successfully with primary PCI using a 2.75 x 24 mm Synergy DES 2.  Mild diffuse nonobstructive disease involving the LAD and left circumflex without any significant stenoses 3.  Normal LVEDP 4.  Moderately severe diffuse residual stenosis in the mid and distal RCA, favor initial medical therapy."  Echo 2021: "Left ventricular ejection fraction, by estimation, is 60 to 65%. The  left ventricle has normal function. The left ventricle has no regional  wall motion abnormalities. Left ventricular diastolic parameters were  normal.  2. Right ventricular systolic function is normal. The right ventricular  size is normal.  3. Left atrial size was mildly dilated.  4. The mitral valve is normal in structure and function. No evidence of  mitral valve regurgitation. No evidence of mitral stenosis.  5. The aortic valve is normal in structure and function. Aortic valve  regurgitation is not visualized. No aortic stenosis is present. "  Since the last visit, she has been more active in her yard.  Denies : Chest pain. Dizziness. Leg edema. Nitroglycerin use. Orthopnea. Palpitations. Paroxysmal nocturnal dyspnea. Shortness of breath. Syncope.    She did feel some SHOB initially, but this has improved.   She was getting physical therapy through [facility].  THis has finished.    She enjoys the rowing machine.    She is vaccinated.     Past Medical History:  Diagnosis Date  . Carotid artery occlusion   . Complication of anesthesia    hard to wake up per pt  . Diverticulitis   . Hypertension   . Hypothyroid   . STEMI (ST elevation myocardial infarction) ([place]) 10/26/2019   DES RCA    Past Surgical History:  Procedure Laterality Date  . BACK SURGERY  1980  . CORONARY/GRAFT ACUTE MI REVASCULARIZATION N/A 10/26/2019   Procedure: Coronary/Graft Acute MI Revascularization;  Surgeon: Sherren Mocha, MD;  Location: [facility];  Service: Cardiovascular;  Laterality: N/A;  . ELBOW SURGERY     left  . FRACTURE SURGERY    . LEFT HEART CATH AND CORONARY ANGIOGRAPHY N/A 10/26/2019   Procedure: LEFT HEART CATH AND CORONARY ANGIOGRAPHY;  Surgeon: Sherren Mocha, MD;  Location: [facility];  Service: Cardiovascular;  Laterality: N/A;  . SPINE SURGERY       Current Outpatient Medications  Medication Sig Dispense Refill  . aspirin EC 81 MG tablet Take 81 mg by mouth daily.    [name] Kitchen atorvastatin (LIPITOR) 80 MG tablet Take 1 tablet (80 mg total) by mouth daily at 6 PM. 90 tablet 3  . Cranberry 1000 MG CAPS Take  1 capsule by mouth daily at 12 noon.    . docusate sodium (COLACE) 100 MG capsule Take 200-300 mg by mouth daily as needed for mild constipation.     [name] Kitchen levothyroxine (SYNTHROID) 175 MCG tablet Take 1 tablet (175 mcg total) by mouth daily before breakfast. Take 1 tablet by mouth every day in the am (Patient taking differently: Take 175 mcg by mouth daily before breakfast. Do not take medicine on sundays) 45 tablet 1  . lisinopril-hydrochlorothiazide (ZESTORETIC) 20-25 MG tablet Take 0.5 tablets by mouth 3 (three) times a week. Take on Monday Wednesday and Friday only 90 tablet 1  . metoprolol tartrate  (LOPRESSOR) 25 MG tablet Take 0.5 tablets (12.5 mg total) by mouth 2 (two) times daily. 180 tablet 3  . nitroGLYCERIN (NITROSTAT) 0.4 MG SL tablet Place 1 tablet (0.4 mg total) under the tongue every 5 (five) minutes x 3 doses as needed for chest pain. 25 tablet 3  . oxybutynin (DITROPAN-XL) 10 MG 24 hr tablet TAKE 1 TABLET BY MOUTH EVERYDAY AT BEDTIME (Patient taking differently: Take 10 mg by mouth at bedtime. ) 90 tablet 1  . ticagrelor (BRILINTA) 90 MG TABS tablet Take 1 tablet (90 mg total) by mouth 2 (two) times daily. 180 tablet 3   Current Facility-Administered Medications  Medication Dose Route Frequency Provider Last Rate Last Admin  . Ring Pessary/Support MISC 1 each  1 each Does not apply Once Anyanwu, Ugonna A, MD        Allergies:   Codeine and Norvasc [amlodipine]    Social History:  The patient  reports that she quit smoking about 33 years ago. She has never used smokeless tobacco. She reports that she does not drink alcohol or use drugs.   Family History:  The patient's family history includes Hypertension in her mother.    ROS:  Please see the history of present illness.   Otherwise, review of systems are positive for balance issues; some bleeding in the toilet- no change sice the MI .   All other systems are reviewed and negative.    PHYSICAL EXAM: VS:  BP 128/76   Pulse 68   Ht 5\' 6"  (1.676 m)   Wt 146 lb (66.2 kg)   SpO2 99%   BMI 23.57 kg/m  , BMI Body mass index is 23.57 kg/m. GEN: Well nourished, well developed, in no acute distress  HEENT: normal  Neck: no JVD, carotid bruits, or masses Cardiac: RRR; 2/6 early systolic murmurs,no rubs, or gallops,no edema  Respiratory:  clear to auscultation bilaterally, normal work of breathing GI: soft, nontender, nondistended, + BS MS: no deformity or atrophy  Skin: warm and dry, no rash Neuro:  Strength and sensation are intact Psych: euthymic mood, full affect   EKG:   The ekg ordered today demonstrates     Recent Labs: 11/29/2019: TSH 0.055 12/01/2019: ALT 16; B Natriuretic Peptide 145.3; Hemoglobin 11.0; Platelets 365 12/02/2019: BUN 22; Creatinine, Ser 1.30; Potassium 4.2; Sodium 131   Lipid Panel    Component Value Date/Time   CHOL 190 10/26/2019 2246   CHOL 156 06/30/2019 1048   TRIG 198 (H) 10/26/2019 2246   HDL 40 (L) 10/26/2019 2246   HDL 48 06/30/2019 1048   CHOLHDL 4.8 10/26/2019 2246   VLDL 40 10/26/2019 2246   LDLCALC 110 (H) 10/26/2019 2246   LDLCALC 88 06/30/2019 1048     Other studies Reviewed: Additional studies/ records that were reviewed today with results demonstrating: TC 190 .  ASSESSMENT AND PLAN:  1.   CAD: Continue aggressive secondary prevention.  Angina controlled on medical therapy.  Residual disease in the mid and distal RCA, not treated at the time of initial cath.  No angina. Continue aggressive secondary prevention. Could stop aspirin if bleeding is a problem and continue Brilinta monotherapy.  2.   Hyperlipidemia: Whole food, plant based diet recommended.  Lipids to be checked with PMD later this week.  3. HTN: Low salt diet with regular exercise as noted below.  She has a BP cuff at home.  BP has improved since decreasing salt.   4. Hyponatermia: Noted in the past.  5. Hypothyroid:  Dose of med was decreased due to decreased TSH.    Current medicines are reviewed at length with the patient today.  The patient concerns regarding her medicines were addressed.  The following changes have been made:  No change  Labs/ tests ordered today include:  No orders of the defined types were placed in this encounter.   Recommend 150 minutes/week of aerobic exercise Low fat, low carb, high fiber diet recommended  Disposition:   FU in 6 months   Signed, Larae Grooms, MD  01/09/2020 9:29 AM    [facility] [place], [place], [place]  [address] Phone: [phone]; Fax: [phone]

## 2020-01-09 ENCOUNTER — Ambulatory Visit: Payer: PPO | Admitting: Interventional Cardiology

## 2020-01-09 ENCOUNTER — Encounter: Payer: Self-pay | Admitting: Interventional Cardiology

## 2020-01-09 ENCOUNTER — Other Ambulatory Visit: Payer: Self-pay

## 2020-01-09 VITALS — BP 128/76 | HR 68 | Ht 66.0 in | Wt 146.0 lb

## 2020-01-09 DIAGNOSIS — I25118 Atherosclerotic heart disease of native coronary artery with other forms of angina pectoris: Secondary | ICD-10-CM

## 2020-01-09 DIAGNOSIS — E782 Mixed hyperlipidemia: Secondary | ICD-10-CM

## 2020-01-09 DIAGNOSIS — I10 Essential (primary) hypertension: Secondary | ICD-10-CM | POA: Diagnosis not present

## 2020-01-09 NOTE — Patient Instructions (Signed)
Medication Instructions:  Your physician recommends that you continue on your current medications as directed. Please refer to the Current Medication list given to you today.  *If you need a refill on your cardiac medications before your next appointment, please call your pharmacy*   Lab Work: None ordered  If you have labs (blood work) drawn today and your tests are completely normal, you will receive your results only by: [name] Kitchen MyChart Message (if you have MyChart) OR . A paper copy in the mail If you have any lab test that is abnormal or we need to change your treatment, we will call you to review the results.   Testing/Procedures: None ordered  Follow-Up: At [facility], you and your health needs are our priority.  As part of our continuing mission to provide you with exceptional heart care, we have created designated Provider Care Teams.  These Care Teams include your primary Cardiologist (physician) and Advanced Practice Providers (APPs -  Physician Assistants and Nurse Practitioners) who all work together to provide you with the care you need, when you need it.  We recommend signing up for the patient portal called "MyChart".  Sign up information is provided on this After Visit Summary.  MyChart is used to connect with patients for Virtual Visits (Telemedicine).  Patients are able to view lab/test results, encounter notes, upcoming appointments, etc.  Non-urgent messages can be sent to your provider as well.   To learn more about what you can do with MyChart, go to [email or web address].    Your next appointment:   6 month(s)  The format for your next appointment:   In Person  Provider:   You may see Larae Grooms, MD or one of the following Advanced Practice Providers on your designated Care Team:    Melina Copa, PA-C  Ermalinda Barrios, PA-C    Other Instructions Your provider recommends that you maintain 150 minutes per week of moderate aerobic  activity.   High-Fiber Diet Fiber, also called dietary fiber, is a type of carbohydrate that is found in fruits, vegetables, whole grains, and beans. A high-fiber diet can have many health benefits. Your health care provider may recommend a high-fiber diet to help:  Prevent constipation. Fiber can make your bowel movements more regular.  Lower your cholesterol.  Relieve the following conditions: ? Swelling of veins in the anus (hemorrhoids). ? Swelling and irritation (inflammation) of specific areas of the digestive tract (uncomplicated diverticulosis). ? A problem of the large intestine (colon) that sometimes causes pain and diarrhea (irritable bowel syndrome, IBS).  Prevent overeating as part of a weight-loss plan.  Prevent heart disease, type 2 diabetes, and certain cancers. What is my plan? The recommended daily fiber intake in grams (g) includes:  38 g for men age 67 or younger.  30 g for men over age 65.  27 g for women age 45 or younger.  21 g for women over age 54. You can get the recommended daily intake of dietary fiber by:  Eating a variety of fruits, vegetables, grains, and beans.  Taking a fiber supplement, if it is not possible to get enough fiber through your diet. What do I need to know about a high-fiber diet?  It is better to get fiber through food sources rather than from fiber supplements. There is not a lot of research about how effective supplements are.  Always check the fiber content on the nutrition facts label of any prepackaged food. Look for foods that contain 5 g  of fiber or more per serving.  Talk with a diet and nutrition specialist (dietitian) if you have questions about specific foods that are recommended or not recommended for your medical condition, especially if those foods are not listed below.  Gradually increase how much fiber you consume. If you increase your intake of dietary fiber too quickly, you may have bloating, cramping, or  gas.  Drink plenty of water. Water helps you to digest fiber. What are tips for following this plan?  Eat a wide variety of high-fiber foods.  Make sure that half of the grains that you eat each day are whole grains.  Eat breads and cereals that are made with whole-grain flour instead of refined flour or white flour.  Eat brown rice, bulgur wheat, or millet instead of white rice.  Start the day with a breakfast that is high in fiber, such as a cereal that contains 5 g of fiber or more per serving.  Use beans in place of meat in soups, salads, and pasta dishes.  Eat high-fiber snacks, such as berries, raw vegetables, nuts, and popcorn.  Choose whole fruits and vegetables instead of processed forms like juice or sauce. What foods can I eat?  Fruits Berries. Pears. Apples. Oranges. Avocado. Prunes and raisins. Dried figs. Vegetables Sweet potatoes. Spinach. Kale. Artichokes. Cabbage. Broccoli. Cauliflower. Green peas. Carrots. Squash. Grains Whole-grain breads. Multigrain cereal. Oats and oatmeal. Brown rice. Barley. Bulgur wheat. [place]. Quinoa. Bran muffins. Popcorn. Rye wafer crackers. Meats and other proteins [place], kidney, and pinto beans. Soybeans. Split peas. Lentils. Nuts and seeds. Dairy Fiber-fortified yogurt. Beverages Fiber-fortified soy milk. Fiber-fortified orange juice. Other foods Fiber bars. The items listed above may not be a complete list of recommended foods and beverages. Contact a dietitian for more options. What foods are not recommended? Fruits Fruit juice. Cooked, strained fruit. Vegetables Fried potatoes. Canned vegetables. Well-cooked vegetables. Grains White bread. Pasta made with refined flour. White rice. Meats and other proteins Fatty cuts of meat. Fried chicken or fried fish. Dairy Milk. Yogurt. Cream cheese. Sour cream. Fats and oils Butters. Beverages Soft drinks. Other foods Cakes and pastries. The items listed above may not be a  complete list of foods and beverages to avoid. Contact a dietitian for more information. Summary  Fiber is a type of carbohydrate. It is found in fruits, vegetables, whole grains, and beans.  There are many health benefits of eating a high-fiber diet, such as preventing constipation, lowering blood cholesterol, helping with weight loss, and reducing your risk of heart disease, diabetes, and certain cancers.  Gradually increase your intake of fiber. Increasing too fast can result in cramping, bloating, and gas. Drink plenty of water while you increase your fiber.  The best sources of fiber include whole fruits and vegetables, whole grains, nuts, seeds, and beans. This information is not intended to replace advice given to you by your health care provider. Make sure you discuss any questions you have with your health care provider. Document Revised: 06/15/2017 Document Reviewed: 06/15/2017 Elsevier Patient Education  [place].   Low-Sodium Eating Plan Sodium, which is an element that makes up salt, helps you maintain a healthy balance of fluids in your body. Too much sodium can increase your blood pressure and cause fluid and waste to be held in your body. Your health care provider or dietitian may recommend following this plan if you have high blood pressure (hypertension), kidney disease, liver disease, or heart failure. Eating less sodium can help lower your  blood pressure, reduce swelling, and protect your heart, liver, and kidneys. What are tips for following this plan? General guidelines  Most people on this plan should limit their sodium intake to 1,500-2,000 mg (milligrams) of sodium each day. Reading food labels   The Nutrition Facts label lists the amount of sodium in one serving of the food. If you eat more than one serving, you must multiply the listed amount of sodium by the number of servings.  Choose foods with less than 140 mg of sodium per serving.  Avoid foods  with 300 mg of sodium or more per serving. Shopping  Look for lower-sodium products, often labeled as "low-sodium" or "no salt added."  Always check the sodium content even if foods are labeled as "unsalted" or "no salt added".  Buy fresh foods. ? Avoid canned foods and premade or frozen meals. ? Avoid canned, cured, or processed meats  Buy breads that have less than 80 mg of sodium per slice. Cooking  Eat more home-cooked food and less restaurant, buffet, and fast food.  Avoid adding salt when cooking. Use salt-free seasonings or herbs instead of table salt or sea salt. Check with your health care provider or pharmacist before using salt substitutes.  Cook with plant-based oils, such as canola, sunflower, or olive oil. Meal planning  When eating at a restaurant, ask that your food be prepared with less salt or no salt, if possible.  Avoid foods that contain MSG (monosodium glutamate). MSG is sometimes added to [place] food, bouillon, and some canned foods. What foods are recommended? The items listed may not be a complete list. Talk with your dietitian about what dietary choices are best for you. Grains Low-sodium cereals, including oats, puffed wheat and rice, and shredded wheat. Low-sodium crackers. Unsalted rice. Unsalted pasta. Low-sodium bread. Whole-grain breads and whole-grain pasta. Vegetables Fresh or frozen vegetables. "No salt added" canned vegetables. "No salt added" tomato sauce and paste. Low-sodium or reduced-sodium tomato and vegetable juice. Fruits Fresh, frozen, or canned fruit. Fruit juice. Meats and other protein foods Fresh or frozen (no salt added) meat, poultry, seafood, and fish. Low-sodium canned tuna and salmon. Unsalted nuts. Dried peas, beans, and lentils without added salt. Unsalted canned beans. Eggs. Unsalted nut butters. Dairy Milk. Soy milk. Cheese that is naturally low in sodium, such as ricotta cheese, fresh mozzarella, or Swiss cheese Low-sodium  or reduced-sodium cheese. Cream cheese. Yogurt. Fats and oils Unsalted butter. Unsalted margarine with no trans fat. Vegetable oils such as canola or olive oils. Seasonings and other foods Fresh and dried herbs and spices. Salt-free seasonings. Low-sodium mustard and ketchup. Sodium-free salad dressing. Sodium-free light mayonnaise. Fresh or refrigerated horseradish. Lemon juice. Vinegar. Homemade, reduced-sodium, or low-sodium soups. Unsalted popcorn and pretzels. Low-salt or salt-free chips. What foods are not recommended? The items listed may not be a complete list. Talk with your dietitian about what dietary choices are best for you. Grains Instant hot cereals. Bread stuffing, pancake, and biscuit mixes. Croutons. Seasoned rice or pasta mixes. Noodle soup cups. Boxed or frozen macaroni and cheese. Regular salted crackers. Self-rising flour. Vegetables Sauerkraut, pickled vegetables, and relishes. Olives. [place] fries. Onion rings. Regular canned vegetables (not low-sodium or reduced-sodium). Regular canned tomato sauce and paste (not low-sodium or reduced-sodium). Regular tomato and vegetable juice (not low-sodium or reduced-sodium). Frozen vegetables in sauces. Meats and other protein foods Meat or fish that is salted, canned, smoked, spiced, or pickled. Bacon, ham, sausage, hotdogs, corned beef, chipped beef, packaged lunch meats, salt pork,  jerky, pickled herring, anchovies, regular canned tuna, sardines, salted nuts. Dairy Processed cheese and cheese spreads. Cheese curds. Blue cheese. Feta cheese. String cheese. Regular cottage cheese. Buttermilk. Canned milk. Fats and oils Salted butter. Regular margarine. Ghee. Bacon fat. Seasonings and other foods Onion salt, garlic salt, seasoned salt, table salt, and sea salt. Canned and packaged gravies. Worcestershire sauce. Tartar sauce. Barbecue sauce. Teriyaki sauce. Soy sauce, including reduced-sodium. Steak sauce. Fish sauce. Oyster sauce.  Cocktail sauce. Horseradish that you find on the shelf. Regular ketchup and mustard. Meat flavorings and tenderizers. Bouillon cubes. Hot sauce and Tabasco sauce. Premade or packaged marinades. Premade or packaged taco seasonings. Relishes. Regular salad dressings. Salsa. Potato and tortilla chips. Corn chips and puffs. Salted popcorn and pretzels. Canned or dried soups. Pizza. Frozen entrees and pot pies. Summary  Eating less sodium can help lower your blood pressure, reduce swelling, and protect your heart, liver, and kidneys.  Most people on this plan should limit their sodium intake to 1,500-2,000 mg (milligrams) of sodium each day.  Canned, boxed, and frozen foods are high in sodium. Restaurant foods, fast foods, and pizza are also very high in sodium. You also get sodium by adding salt to food.  Try to cook at home, eat more fresh fruits and vegetables, and eat less fast food, canned, processed, or prepared foods. This information is not intended to replace advice given to you by your health care provider. Make sure you discuss any questions you have with your health care provider. Document Revised: 07/24/2017 Document Reviewed: 08/04/2016 Elsevier Patient Education  2020 [employer].

## 2020-01-11 ENCOUNTER — Other Ambulatory Visit: Payer: Self-pay | Admitting: Nurse Practitioner

## 2020-01-11 ENCOUNTER — Other Ambulatory Visit: Payer: Self-pay

## 2020-01-11 ENCOUNTER — Other Ambulatory Visit: Payer: PPO

## 2020-01-11 ENCOUNTER — Encounter: Payer: PPO | Admitting: Nurse Practitioner

## 2020-01-11 ENCOUNTER — Ambulatory Visit (INDEPENDENT_AMBULATORY_CARE_PROVIDER_SITE_OTHER): Payer: PPO

## 2020-01-11 VITALS — BP 140/70 | HR 59 | Temp 97.5°F | Ht 60.8 in | Wt 144.2 lb

## 2020-01-11 DIAGNOSIS — E039 Hypothyroidism, unspecified: Secondary | ICD-10-CM | POA: Diagnosis not present

## 2020-01-11 DIAGNOSIS — Z23 Encounter for immunization: Secondary | ICD-10-CM

## 2020-01-11 DIAGNOSIS — Z0000 Encounter for general adult medical examination without abnormal findings: Secondary | ICD-10-CM | POA: Diagnosis not present

## 2020-01-11 DIAGNOSIS — I10 Essential (primary) hypertension: Secondary | ICD-10-CM | POA: Diagnosis not present

## 2020-01-11 LAB — CBC
Hematocrit: 32.9 % — ABNORMAL LOW (ref 34.0–46.6)
Hemoglobin: 10.2 g/dL — ABNORMAL LOW (ref 11.1–15.9)
MCH: 27.4 pg (ref 26.6–33.0)
MCHC: 31 g/dL — ABNORMAL LOW (ref 31.5–35.7)
MCV: 88 fL (ref 79–97)
Platelets: 336 10*3/uL (ref 150–450)
RBC: 3.72 x10E6/uL — ABNORMAL LOW (ref 3.77–5.28)
RDW: 14.5 % (ref 11.7–15.4)
WBC: 8.6 10*3/uL (ref 3.4–10.8)

## 2020-01-11 LAB — POCT UA - MICROALBUMIN
Creatinine, POC: 300 mg/dL
Microalbumin Ur, POC: 80 mg/L

## 2020-01-11 MED ORDER — BOOSTRIX 5-2.5-18.5 LF-MCG/0.5 IM SUSP: 0.5000 mL | Freq: Once | INTRAMUSCULAR | 0 refills | Status: AC

## 2020-01-11 NOTE — Progress Notes (Signed)
This visit occurred during the SARS-CoV-2 public health emergency.  Safety protocols were in place, including screening questions prior to the visit, additional usage of staff PPE, and extensive cleaning of exam room while observing appropriate contact time as indicated for disinfecting solutions.  Subjective:   Abigail [name] is a 84 y.o. female who presents for Medicare Annual (Subsequent) preventive examination.  Review of Systems:  n/a Cardiac Risk Factors include: advanced age (>54men, >60 women);hypertension     Objective:     Vitals: BP 140/70 (BP Location: Left Arm, Patient Position: Sitting, Cuff Size: Normal)   Pulse (!) 59   Temp (!) 97.5 F (36.4 C) (Oral)   Ht 5' 0.8" (1.544 m)   Wt 144 lb 3.2 oz (65.4 kg)   SpO2 97%   BMI 27.43 kg/m   Body mass index is 27.43 kg/m.  Advanced Directives 01/11/2020 12/01/2019 10/27/2019 10/26/2019 12/29/2018 10/12/2018 05/13/2015  Does Patient Have a Medical Advance Directive? No No No No No No No  Would patient like information on creating a medical advance directive? No - Patient declined No - Patient declined Yes (Inpatient - patient requests chaplain consult to create a medical advance directive) - - - No - patient declined information    Tobacco Social History   Tobacco Use  Smoking Status Former Smoker  . Quit date: 04/05/1986  . Years since quitting: 33.7  Smokeless Tobacco Never Used     Counseling given: Not Answered   Clinical Intake:  Pre-visit preparation completed: Yes  Pain : No/denies pain     Nutritional Status: BMI 25 -29 Overweight Nutritional Risks: None Diabetes: No  How often do you need to have someone help you when you read instructions, pamphlets, or other written materials from your doctor or pharmacy?: 1 - Never What is the last grade level you completed in school?: some business school  Interpreter Needed?: No  Information entered by :: NAllen LPN  Past Medical History:  Diagnosis Date  .  Carotid artery occlusion   . Complication of anesthesia    hard to wake up per pt  . Diverticulitis   . Hypertension   . Hypothyroid   . STEMI (ST elevation myocardial infarction) ([place]) 10/26/2019   DES RCA   Past Surgical History:  Procedure Laterality Date  . BACK SURGERY  1980  . CORONARY/GRAFT ACUTE MI REVASCULARIZATION N/A 10/26/2019   Procedure: Coronary/Graft Acute MI Revascularization;  Surgeon: Sherren Mocha, MD;  Location: [facility];  Service: Cardiovascular;  Laterality: N/A;  . ELBOW SURGERY     left  . FRACTURE SURGERY    . LEFT HEART CATH AND CORONARY ANGIOGRAPHY N/A 10/26/2019   Procedure: LEFT HEART CATH AND CORONARY ANGIOGRAPHY;  Surgeon: Sherren Mocha, MD;  Location: [facility];  Service: Cardiovascular;  Laterality: N/A;  . SPINE SURGERY     Family History  Problem Relation Age of Onset  . Hypertension Mother    Social History   Socioeconomic History  . Marital status: Single    Spouse name: Not on file  . Number of children: Not on file  . Years of education: Not on file  . Highest education level: Not on file  Occupational History  . Occupation: semi retired  Tobacco Use  . Smoking status: Former Smoker    Quit date: 04/05/1986    Years since quitting: 33.7  . Smokeless tobacco: Never Used  Substance and Sexual Activity  . Alcohol use: No  . Drug use: No  .  Sexual activity: Not Currently    Birth control/protection: Post-menopausal  Other Topics Concern  . Not on file  Social History Narrative  . Not on file   Social Determinants of Health   Financial Resource Strain: Low Risk   . Difficulty of Paying Living Expenses: Not hard at all  Food Insecurity: No Food Insecurity  . Worried About Charity fundraiser in the Last Year: Never true  . Ran Out of Food in the Last Year: Never true  Transportation Needs: No Transportation Needs  . Lack of Transportation (Medical): No  . Lack of Transportation (Non-Medical): No  Physical  Activity: Inactive  . Days of Exercise per Week: 0 days  . Minutes of Exercise per Session: 0 min  Stress: No Stress Concern Present  . Feeling of Stress : Not at all  Social Connections:   . Frequency of Communication with Friends and Family:   . Frequency of Social Gatherings with Friends and Family:   . Attends Religious Services:   . Active Member of Clubs or Organizations:   . Attends Archivist Meetings:   [name] Kitchen Marital Status:     Outpatient Encounter Medications as of 01/11/2020  Medication Sig  . aspirin EC 81 MG tablet Take 81 mg by mouth daily.  [name] Kitchen atorvastatin (LIPITOR) 80 MG tablet Take 1 tablet (80 mg total) by mouth daily at 6 PM.  . Cranberry 1000 MG CAPS Take 1 capsule by mouth daily at 12 noon.  . docusate sodium (COLACE) 100 MG capsule Take 200-300 mg by mouth daily as needed for mild constipation.   [name] Kitchen levothyroxine (SYNTHROID) 175 MCG tablet Take 1 tablet (175 mcg total) by mouth daily before breakfast. Take 1 tablet by mouth every day in the am (Patient taking differently: Take 175 mcg by mouth daily before breakfast. Do not take medicine on sundays)  . lisinopril-hydrochlorothiazide (ZESTORETIC) 20-25 MG tablet Take 0.5 tablets by mouth 3 (three) times a week. Take on Monday Wednesday and Friday only  . metoprolol tartrate (LOPRESSOR) 25 MG tablet Take 0.5 tablets (12.5 mg total) by mouth 2 (two) times daily.  . nitroGLYCERIN (NITROSTAT) 0.4 MG SL tablet Place 1 tablet (0.4 mg total) under the tongue every 5 (five) minutes x 3 doses as needed for chest pain.  [name] Kitchen oxybutynin (DITROPAN-XL) 10 MG 24 hr tablet TAKE 1 TABLET BY MOUTH EVERYDAY AT BEDTIME (Patient taking differently: Take 10 mg by mouth at bedtime. )  . ticagrelor (BRILINTA) 90 MG TABS tablet Take 1 tablet (90 mg total) by mouth 2 (two) times daily.  . Tdap (BOOSTRIX) 5-2.5-18.5 LF-MCG/0.5 injection Inject 0.5 mLs into the muscle once for 1 dose.   Facility-Administered Encounter Medications as of  01/11/2020  Medication  . Ring Pessary/Support MISC 1 each    Activities of Daily Living In your present state of health, do you have any difficulty performing the following activities: 01/11/2020 10/27/2019  Hearing? N N  Vision? N N  Difficulty concentrating or making decisions? N N  Walking or climbing stairs? N Y  Dressing or bathing? N N  Comment - -  Doing errands, shopping? N N  Preparing Food and eating ? N -  Using the Toilet? N -  In the past six months, have you accidently leaked urine? N -  Do you have problems with loss of bowel control? N -  Managing your Medications? N -  Managing your Finances? N -  Housekeeping or managing your Housekeeping? N -  Some  recent data might be hidden    Patient Care Team: [name], FNP as PCP - General ([place]) Jettie Booze, MD as PCP - Cardiology (Cardiology) Rex Kras, Claudette Stapler, RN as [facility] Management    Assessment:   This is a routine wellness examination for [facility].  Exercise Activities and Dietary recommendations    Goals    . "I am suppose to start Cardiac Rehab" (pt-stated)     CARE PLAN ENTRY (see longitudinal plan of care for additional care plan information)  Current Barriers:  [name] Kitchen Knowledge Deficits related to disease process and Self Health management s/p STEMI 10/26/2019  . Chronic Disease Management support and education needs related to Acquired hypothyroidism, Anxiety, Essential hypertension, Prediabetes, Hyperlipidemia  Nurse Case Manager Clinical Goal(s):  [name] Kitchen Over the next 90 days, patient will work with CCM RN CM and PCP to address needs related to disease education and support for improved Self Health management of s/p STEMI involving right coronary artery   CCM RN CM Interventions:  12/15/19 call completed with patient  . Inter-disciplinary care team collaboration (see longitudinal plan of care) . Evaluation of current treatment plan related to CAD, s/p STEMI with  right coronary artery occlusion and patient's adherence to plan as established by provider . Assessed for knowledge and understanding of recent discharge following STEMI and recommendations for post d/c follow up . Assessed for knowledge and understanding of reoccurring s/s suggestive of MI and or decreased Cardiac perfusion  . Advised patient to f/u with her Cardiologist as recommended, discussed OV scheduled with Dr. Irish Lack scheduled for 01/09/20 @9 :20 AM  . Reviewed medications with patient and discussed indication, dosage and frequency of prescribed medications; patient reports adherence; reports having financial hardship with cost Brilinta . Discussed plans with patient for ongoing care management follow up and provided patient with direct contact information for care management team . Pharmacy referral for assistance with medication cost of Brilinta   Patient Self Care Activities:  . Self administers medications as prescribed . Attends all scheduled provider appointments . Calls pharmacy for medication refills . Performs ADL's independently . Performs IADL's independently . Calls provider office for new concerns or questions  Initial goal documentation     . "to learn more about how to manage my chronic conditions' (pt-stated)     Current Barriers:  [name] Kitchen Knowledge Deficits related to disease process and Self Health management of Hypothyroidism . Chronic Disease Management support and education needs related to Acquired Hypothyroidism, Essential Hypertension, Pre-diabetes, Hyperlipidemia  Nurse Case Manager Clinical Goal(s):  [name] Kitchen Over the next 90 days, patient will work with the [facility] CM and PCP to address needs related to disease education and support for Acquired hypothyroidism   CCM RN CM Interventions:  12/15/19 call completed with patient  . Evaluation of current treatment plan related to Acquired hypothyroidism and patient's adherence to plan as established by  provider . Discussed patient's abnormal TSH obtained 2 weeks ago during hospital admission  . Determined how patient is taking her Synthroid and confirmed recent dosage change; Synthroid 175 mg am before breakfast except on Sunday's . Discussed next scheduled OV with PCP at which time her TSH will be rechecked . Determined patient is experiencing increased fatigue; Educated patient on symptoms suggestive of thyroid disease including fatigue . Encouraged patient to continue to balance her activity with plenty of rest and educated on importance of drinking plenty of water  . Encouraged patient to keep her PCP well informed of  new or worsening symptoms that may be related to her thyroid condition  . Discussed plans with patient for ongoing care management follow up and provided patient with direct contact information for care management team  Patient Self Care Activities:  . Self administers medications as prescribed . Attends all scheduled provider appointments . Calls pharmacy for medication refills . Performs ADL's independently . Performs IADL's independently . Calls provider office for new concerns or questions  Please see past updates related to this goal by clicking on the "Past Updates" button in the selected goal      . Patient Stated     Wants to have good blood pressure    . Patient Stated     01/11/2020, wants to start going back to the gym       Fall Risk Fall Risk  01/11/2020 11/29/2019 06/30/2019 02/09/2019 12/29/2018  Falls in the past year? 0 0 0 0 0  Comment - - - - -  Risk for fall due to : Medication side effect;Impaired balance/gait - - - Medication side effect  Follow up Falls evaluation completed;Education provided;Falls prevention discussed - - - Falls prevention discussed;Education provided   Is the patient's home free of loose throw rugs in walkways, pet beds, electrical cords, etc?   yes      Grab bars in the bathroom? yes      Handrails on the stairs?   n/a       Adequate lighting?   yes  Timed Get Up and Go performed: n/a  Depression Screen PHQ 2/9 Scores 01/11/2020 11/29/2019 06/30/2019 02/09/2019  PHQ - 2 Score 0 0 0 3  PHQ- 9 Score 0 - - 5     Cognitive Function     6CIT Screen 01/11/2020 12/29/2018  What Year? 0 points 0 points  What month? 0 points 0 points  What time? 0 points 3 points  Count back from 20 0 points 0 points  Months in reverse 0 points 0 points  Repeat phrase 0 points 0 points  Total Score 0 3    Immunization History  Administered Date(s) Administered  . Influenza Nasal 06/28/2019  . Influenza-Unspecified 05/14/2015, 04/25/2017, 06/19/2018  . PFIZER SARS-COV-2 Vaccination 12/10/2019, 01/02/2020  . Pneumococcal Conjugate-13 09/01/2019  . Pneumococcal-Unspecified 08/24/2012  . Tdap 02/17/2008  . Zoster Recombinat (Shingrix) 06/28/2019, 10/05/2019    Qualifies for Shingles Vaccine? yes  Screening Tests Health Maintenance  Topic Date Due  . TETANUS/TDAP  02/16/2018  . INFLUENZA VACCINE  03/25/2020  . DEXA SCAN  Completed  . COVID-19 Vaccine  Completed  . PNA vac Low Risk Adult  Completed    Cancer Screenings: Lung: Low Dose CT Chest recommended if Age 63-80 years, 30 pack-year currently smoking OR have quit w/in 15years. Patient does not qualify. Breast:  Up to date on Mammogram? Yes   Up to date of Bone Density/Dexa? Yes Colorectal: not required  Additional Screenings: : Hepatitis C Screening: n/a     Plan:    Patient wants to start going back to the gym.   I have personally reviewed and noted the following in the patient's chart:   . Medical and social history . Use of alcohol, tobacco or illicit drugs  . Current medications and supplements . Functional ability and status . Nutritional status . Physical activity . Advanced directives . List of other physicians . Hospitalizations, surgeries, and ER visits in previous 12 months . Vitals . Screenings to include cognitive, depression, and  falls . Referrals  and appointments  In addition, I have reviewed and discussed with patient certain preventive protocols, quality metrics, and best practice recommendations. A written personalized care plan for preventive services as well as general preventive health recommendations were provided to patient.     Kellie Simmering, LPN  12/30/2255

## 2020-01-11 NOTE — Addendum Note (Signed)
Addended by: Glenna Durand E on: 01/11/2020 10:36 AM   Modules accepted: Orders

## 2020-01-11 NOTE — Patient Instructions (Signed)
Abigail [name] , Thank you for taking time to come for your Medicare Wellness Visit. I appreciate your ongoing commitment to your health goals. Please review the following plan we discussed and let me know if I can assist you in the future.   Screening recommendations/referrals: Colonoscopy: not required Mammogram: not required Bone Density: 03/2002 Recommended yearly ophthalmology/optometry visit for glaucoma screening and checkup Recommended yearly dental visit for hygiene and checkup  Vaccinations: Influenza vaccine: 06/2019 Pneumococcal vaccine: 08/2019 Tdap vaccine: sent to pharmacy Shingles vaccine: 09/2019    Advanced directives: Advance directive discussed with you today. Even though you declined this today please call our office should you change your mind and we can give you the proper paperwork for you to fill out.   Conditions/risks identified: overweight  Next appointment: 01/16/2021 at 8:30   Preventive Care 39 Years and Older, Female Preventive care refers to lifestyle choices and visits with your health care provider that can promote health and wellness. What does preventive care include?  A yearly physical exam. This is also called an annual well check.  Dental exams once or twice a year.  Routine eye exams. Ask your health care provider how often you should have your eyes checked.  Personal lifestyle choices, including:  Daily care of your teeth and gums.  Regular physical activity.  Eating a healthy diet.  Avoiding tobacco and drug use.  Limiting alcohol use.  Practicing safe sex.  Taking low-dose aspirin every day.  Taking vitamin and mineral supplements as recommended by your health care provider. What happens during an annual well check? The services and screenings done by your health care provider during your annual well check will depend on your age, overall health, lifestyle risk factors, and family history of disease. Counseling  Your health care  provider may ask you questions about your:  Alcohol use.  Tobacco use.  Drug use.  Emotional well-being.  Home and relationship well-being.  Sexual activity.  Eating habits.  History of falls.  Memory and ability to understand (cognition).  Work and work Statistician.  Reproductive health. Screening  You may have the following tests or measurements:  Height, weight, and BMI.  Blood pressure.  Lipid and cholesterol levels. These may be checked every 5 years, or more frequently if you are over 25 years old.  Skin check.  Lung cancer screening. You may have this screening every year starting at age 34 if you have a 30-pack-year history of smoking and currently smoke or have quit within the past 15 years.  Fecal occult blood test (FOBT) of the stool. You may have this test every year starting at age 16.  Flexible sigmoidoscopy or colonoscopy. You may have a sigmoidoscopy every 5 years or a colonoscopy every 10 years starting at age 60.  Hepatitis C blood test.  Hepatitis B blood test.  Sexually transmitted disease (STD) testing.  Diabetes screening. This is done by checking your blood sugar (glucose) after you have not eaten for a while (fasting). You may have this done every 1-3 years.  Bone density scan. This is done to screen for osteoporosis. You may have this done starting at age 83.  Mammogram. This may be done every 1-2 years. Talk to your health care provider about how often you should have regular mammograms. Talk with your health care provider about your test results, treatment options, and if necessary, the need for more tests. Vaccines  Your health care provider may recommend certain vaccines, such as:  Influenza vaccine.  This is recommended every year.  Tetanus, diphtheria, and acellular pertussis (Tdap, Td) vaccine. You may need a Td booster every 10 years.  Zoster vaccine. You may need this after age 84.  Pneumococcal 13-valent conjugate (PCV13)  vaccine. One dose is recommended after age 64.  Pneumococcal polysaccharide (PPSV23) vaccine. One dose is recommended after age 26. Talk to your health care provider about which screenings and vaccines you need and how often you need them. This information is not intended to replace advice given to you by your health care provider. Make sure you discuss any questions you have with your health care provider. Document Released: 09/07/2015 Document Revised: 04/30/2016 Document Reviewed: 06/12/2015 Elsevier Interactive Patient Education  2017 [place] Prevention in the Home Falls can cause injuries. They can happen to people of all ages. There are many things you can do to make your home safe and to help prevent falls. What can I do on the outside of my home?  Regularly fix the edges of walkways and driveways and fix any cracks.  Remove anything that might make you trip as you walk through a door, such as a raised step or threshold.  Trim any bushes or trees on the path to your home.  Use bright outdoor lighting.  Clear any walking paths of anything that might make someone trip, such as rocks or tools.  Regularly check to see if handrails are loose or broken. Make sure that both sides of any steps have handrails.  Any raised decks and porches should have guardrails on the edges.  Have any leaves, snow, or ice cleared regularly.  Use sand or salt on walking paths during winter.  Clean up any spills in your garage right away. This includes oil or grease spills. What can I do in the bathroom?  Use night lights.  Install grab bars by the toilet and in the tub and shower. Do not use towel bars as grab bars.  Use non-skid mats or decals in the tub or shower.  If you need to sit down in the shower, use a plastic, non-slip stool.  Keep the floor dry. Clean up any water that spills on the floor as soon as it happens.  Remove soap buildup in the tub or shower  regularly.  Attach bath mats securely with double-sided non-slip rug tape.  Do not have throw rugs and other things on the floor that can make you trip. What can I do in the bedroom?  Use night lights.  Make sure that you have a light by your bed that is easy to reach.  Do not use any sheets or blankets that are too big for your bed. They should not hang down onto the floor.  Have a firm chair that has side arms. You can use this for support while you get dressed.  Do not have throw rugs and other things on the floor that can make you trip. What can I do in the kitchen?  Clean up any spills right away.  Avoid walking on wet floors.  Keep items that you use a lot in easy-to-reach places.  If you need to reach something above you, use a strong step stool that has a grab bar.  Keep electrical cords out of the way.  Do not use floor polish or wax that makes floors slippery. If you must use wax, use non-skid floor wax.  Do not have throw rugs and other things on the floor that can make  you trip. What can I do with my stairs?  Do not leave any items on the stairs.  Make sure that there are handrails on both sides of the stairs and use them. Fix handrails that are broken or loose. Make sure that handrails are as long as the stairways.  Check any carpeting to make sure that it is firmly attached to the stairs. Fix any carpet that is loose or worn.  Avoid having throw rugs at the top or bottom of the stairs. If you do have throw rugs, attach them to the floor with carpet tape.  Make sure that you have a light switch at the top of the stairs and the bottom of the stairs. If you do not have them, ask someone to add them for you. What else can I do to help prevent falls?  Wear shoes that:  Do not have high heels.  Have rubber bottoms.  Are comfortable and fit you well.  Are closed at the toe. Do not wear sandals.  If you use a stepladder:  Make sure that it is fully  opened. Do not climb a closed stepladder.  Make sure that both sides of the stepladder are locked into place.  Ask someone to hold it for you, if possible.  Clearly mark and make sure that you can see:  Any grab bars or handrails.  First and last steps.  Where the edge of each step is.  Use tools that help you move around (mobility aids) if they are needed. These include:  Canes.  Walkers.  Scooters.  Crutches.  Turn on the lights when you go into a dark area. Replace any light bulbs as soon as they burn out.  Set up your furniture so you have a clear path. Avoid moving your furniture around.  If any of your floors are uneven, fix them.  If there are any pets around you, be aware of where they are.  Review your medicines with your doctor. Some medicines can make you feel dizzy. This can increase your chance of falling. Ask your doctor what other things that you can do to help prevent falls. This information is not intended to replace advice given to you by your health care provider. Make sure you discuss any questions you have with your health care provider. Document Released: 06/07/2009 Document Revised: 01/17/2016 Document Reviewed: 09/15/2014 Elsevier Interactive Patient Education  2017 [employer].

## 2020-01-12 ENCOUNTER — Telehealth: Payer: Self-pay

## 2020-01-12 ENCOUNTER — Other Ambulatory Visit: Payer: Self-pay | Admitting: Nurse Practitioner

## 2020-01-12 DIAGNOSIS — E039 Hypothyroidism, unspecified: Secondary | ICD-10-CM

## 2020-01-12 LAB — T4: T4, Total: 8.7 ug/dL (ref 4.5–12.0)

## 2020-01-12 LAB — CMP14+EGFR
ALT: 11 IU/L (ref 0–32)
AST: 15 IU/L (ref 0–40)
Albumin/Globulin Ratio: 1.7 (ref 1.2–2.2)
Albumin: 4.2 g/dL (ref 3.6–4.6)
Alkaline Phosphatase: 117 IU/L (ref 48–121)
BUN/Creatinine Ratio: 12 (ref 12–28)
BUN: 14 mg/dL (ref 8–27)
Bilirubin Total: <0.2 mg/dL (ref 0.0–1.2)
CO2: 21 mmol/L (ref 20–29)
Calcium: 9.1 mg/dL (ref 8.7–10.3)
Chloride: 99 mmol/L (ref 96–106)
Creatinine, Ser: 1.18 mg/dL — ABNORMAL HIGH (ref 0.57–1.00)
GFR calc Af Amer: 50 mL/min/{1.73_m2} — ABNORMAL LOW (ref >59)
GFR calc non Af Amer: 43 mL/min/{1.73_m2} — ABNORMAL LOW (ref >59)
Globulin, Total: 2.5 g/dL (ref 1.5–4.5)
Glucose: 109 mg/dL — ABNORMAL HIGH (ref 65–99)
Potassium: 4.5 mmol/L (ref 3.5–5.2)
Sodium: 134 mmol/L (ref 134–144)
Total Protein: 6.7 g/dL (ref 6.0–8.5)

## 2020-01-12 LAB — T3, FREE: T3, Free: 2.4 pg/mL (ref 2.0–4.4)

## 2020-01-12 LAB — TSH: TSH: 0.216 u[IU]/mL — ABNORMAL LOW (ref 0.450–4.500)

## 2020-01-12 MED ORDER — LEVOTHYROXINE SODIUM 175 MCG PO TABS: ORAL_TABLET | ORAL | 1 refills | Status: DC

## 2020-01-12 NOTE — Telephone Encounter (Signed)
Patient notified of lab results. YL,RMA

## 2020-01-13 ENCOUNTER — Encounter (HOSPITAL_COMMUNITY): Payer: Self-pay

## 2020-01-13 ENCOUNTER — Telehealth: Payer: Self-pay

## 2020-01-13 ENCOUNTER — Telehealth (HOSPITAL_COMMUNITY): Payer: Self-pay

## 2020-01-13 NOTE — Telephone Encounter (Signed)
Attempted to call patient in regards to Cardiac Rehab - LM on VM Mailed letter 

## 2020-01-22 NOTE — Progress Notes (Signed)
She did not need to see the provider today, seen within the last 6 weeks for her chronic health problems

## 2020-02-02 ENCOUNTER — Telehealth (HOSPITAL_COMMUNITY): Payer: Self-pay

## 2020-02-02 NOTE — Telephone Encounter (Signed)
No response from pt regarding CR.  Closed referral.  

## 2020-02-09 ENCOUNTER — Other Ambulatory Visit: Payer: Self-pay

## 2020-02-09 ENCOUNTER — Other Ambulatory Visit: Payer: PPO

## 2020-02-09 DIAGNOSIS — E039 Hypothyroidism, unspecified: Secondary | ICD-10-CM | POA: Diagnosis not present

## 2020-02-10 LAB — TSH: TSH: 1.08 u[IU]/mL (ref 0.450–4.500)

## 2020-02-10 LAB — T4: T4, Total: 7.9 ug/dL (ref 4.5–12.0)

## 2020-02-10 LAB — T3, FREE: T3, Free: 2.2 pg/mL (ref 2.0–4.4)

## 2020-02-13 ENCOUNTER — Ambulatory Visit: Payer: Self-pay

## 2020-02-13 ENCOUNTER — Other Ambulatory Visit: Payer: Self-pay

## 2020-02-13 ENCOUNTER — Telehealth: Payer: Self-pay

## 2020-02-13 DIAGNOSIS — I10 Essential (primary) hypertension: Secondary | ICD-10-CM

## 2020-02-13 DIAGNOSIS — E785 Hyperlipidemia, unspecified: Secondary | ICD-10-CM

## 2020-02-13 DIAGNOSIS — E039 Hypothyroidism, unspecified: Secondary | ICD-10-CM

## 2020-02-13 DIAGNOSIS — F419 Anxiety disorder, unspecified: Secondary | ICD-10-CM

## 2020-02-13 DIAGNOSIS — R7303 Prediabetes: Secondary | ICD-10-CM

## 2020-02-14 NOTE — Chronic Care Management (AMB) (Signed)
  Chronic Care Management   Follow Up Note   02/13/2020 Name: Abigail [name] MRN: [id number] DOB: 1936/05/13  Referred by: [name], FNP Reason for referral : Chronic Care Management (FU RN Call - thryoid/Cardiac)   Abigail [name] is a 84 y.o. year old female who is a primary care patient of [name], [place]. The CCM team was consulted for assistance with chronic disease management and care coordination needs.    Review of patient status, including review of consultants reports, relevant laboratory and other test results, and collaboration with appropriate care team members and the patient's provider was performed as part of comprehensive patient evaluation and provision of chronic care management services.    SDOH (Social Determinants of Health) assessments performed: No See Care Plan activities for detailed interventions related to [facility])   Reviewed chart in preparation to contact patient.     Outpatient Encounter Medications as of 02/13/2020  Medication Sig Note  . aspirin EC 81 MG tablet Take 81 mg by mouth daily.   [name] Kitchen atorvastatin (LIPITOR) 80 MG tablet Take 1 tablet (80 mg total) by mouth daily at 6 PM.   . Cranberry 1000 MG CAPS Take 1 capsule by mouth daily at 12 noon.   . docusate sodium (COLACE) 100 MG capsule Take 200-300 mg by mouth daily as needed for mild constipation.    [name] Kitchen levothyroxine (SYNTHROID) 175 MCG tablet Take 1 tablet by mouth every am on Monday - Friday   . lisinopril-hydrochlorothiazide (ZESTORETIC) 20-25 MG tablet Take 0.5 tablets by mouth 3 (three) times a week. Take on Monday Wednesday and Friday only   . metoprolol tartrate (LOPRESSOR) 25 MG tablet Take 0.5 tablets (12.5 mg total) by mouth 2 (two) times daily. 12/01/2019: Pt can't remember if she took this yesterday because she went to the Dr and the nurse called her at home and told her to stop taking it.   . nitroGLYCERIN (NITROSTAT) 0.4 MG SL tablet Place 1 tablet (0.4 mg total) under the tongue every 5  (five) minutes x 3 doses as needed for chest pain. 12/01/2019: Pt took 2 tablets before coming to ED.   [name] Kitchen oxybutynin (DITROPAN-XL) 10 MG 24 hr tablet TAKE 1 TABLET BY MOUTH EVERYDAY AT BEDTIME (Patient taking differently: Take 10 mg by mouth at bedtime. )   . ticagrelor (BRILINTA) 90 MG TABS tablet Take 1 tablet (90 mg total) by mouth 2 (two) times daily.    Facility-Administered Encounter Medications as of 02/13/2020  Medication  . Ring Pessary/Support MISC 1 each     Objective:  Lab Results  Component Value Date   HGBA1C 5.8 (H) 10/26/2019   HGBA1C 5.8 (H) 06/30/2019   HGBA1C 5.8 (H) 06/25/2018   Lab Results  Component Value Date   MICROALBUR 80 01/11/2020   LDLCALC 110 (H) 10/26/2019   CREATININE 1.18 (H) 01/11/2020   BP Readings from Last 3 Encounters:  01/11/20 140/70  01/11/20 140/70  01/09/20 128/76   Plan:   Telephone follow up appointment with care management team member scheduled for: 02/16/20  Barb Merino, RN, BSN, CCM Care Management Coordinator [place] [facility]  Direct Phone: [phone]

## 2020-02-16 ENCOUNTER — Ambulatory Visit (INDEPENDENT_AMBULATORY_CARE_PROVIDER_SITE_OTHER): Payer: PPO

## 2020-02-16 ENCOUNTER — Telehealth: Payer: Self-pay

## 2020-02-16 ENCOUNTER — Other Ambulatory Visit: Payer: Self-pay

## 2020-02-16 DIAGNOSIS — E039 Hypothyroidism, unspecified: Secondary | ICD-10-CM | POA: Diagnosis not present

## 2020-02-16 DIAGNOSIS — E785 Hyperlipidemia, unspecified: Secondary | ICD-10-CM

## 2020-02-16 DIAGNOSIS — I10 Essential (primary) hypertension: Secondary | ICD-10-CM | POA: Diagnosis not present

## 2020-02-16 DIAGNOSIS — R7303 Prediabetes: Secondary | ICD-10-CM

## 2020-02-17 NOTE — Chronic Care Management (AMB) (Signed)
  Chronic Care Management   Outreach Note  02/17/2020 Name: Abigail [name] MRN: [id number] DOB: 30-Jan-1936  Referred by: [name], FNP Reason for referral : Chronic Care Management (FU RN Call - thryoid/Cardiac)   An unsuccessful telephone outreach was attempted today. The patient was referred to the case management team for assistance with care management and care coordination. I spoke with Ms. Gladson briefly, she is unavailable to speak with me at this time, she is agreeable to a call back next week.   Follow Up Plan: Telephone follow up appointment with care management team member scheduled for: 02/23/20  Barb Merino, RN, BSN, CCM Care Management Coordinator [place] [facility]  Direct Phone: [phone]

## 2020-02-18 ENCOUNTER — Emergency Department (HOSPITAL_COMMUNITY)
Admission: EM | Admit: 2020-02-18 | Discharge: 2020-02-18 | Disposition: A | Discharge status: Home / Self Care | Payer: PPO | Attending: Emergency Medicine | Admitting: Emergency Medicine

## 2020-02-18 DIAGNOSIS — N183 Chronic kidney disease, stage 3 unspecified: Secondary | ICD-10-CM | POA: Insufficient documentation

## 2020-02-18 DIAGNOSIS — N3000 Acute cystitis without hematuria: Secondary | ICD-10-CM

## 2020-02-18 DIAGNOSIS — Z79890 Hormone replacement therapy (postmenopausal): Secondary | ICD-10-CM | POA: Diagnosis not present

## 2020-02-18 DIAGNOSIS — Z79899 Other long term (current) drug therapy: Secondary | ICD-10-CM | POA: Insufficient documentation

## 2020-02-18 DIAGNOSIS — Z87891 Personal history of nicotine dependence: Secondary | ICD-10-CM | POA: Insufficient documentation

## 2020-02-18 DIAGNOSIS — E039 Hypothyroidism, unspecified: Secondary | ICD-10-CM | POA: Insufficient documentation

## 2020-02-18 DIAGNOSIS — N390 Urinary tract infection, site not specified: Secondary | ICD-10-CM | POA: Diagnosis present

## 2020-02-18 DIAGNOSIS — I129 Hypertensive chronic kidney disease with stage 1 through stage 4 chronic kidney disease, or unspecified chronic kidney disease: Secondary | ICD-10-CM | POA: Insufficient documentation

## 2020-02-18 DIAGNOSIS — Z7982 Long term (current) use of aspirin: Secondary | ICD-10-CM | POA: Insufficient documentation

## 2020-02-18 LAB — URINALYSIS, ROUTINE W REFLEX MICROSCOPIC
Bilirubin Urine: NEGATIVE
Glucose, UA: NEGATIVE mg/dL
Ketones, ur: NEGATIVE mg/dL
Nitrite: NEGATIVE
Protein, ur: NEGATIVE mg/dL
Specific Gravity, Urine: 1.003 — ABNORMAL LOW (ref 1.005–1.030)
pH: 6 (ref 5.0–8.0)

## 2020-02-18 MED ORDER — CEPHALEXIN 500 MG PO CAPS: 1000.0000 mg | ORAL_CAPSULE | Freq: Two times a day (BID) | ORAL | 0 refills | Status: DC

## 2020-02-18 MED ORDER — CEPHALEXIN 500 MG PO CAPS
1000.0000 mg | ORAL_CAPSULE | Freq: Once | ORAL | Status: AC
  Administered 2020-02-18: 1000 mg via ORAL
  Filled 2020-02-18: qty 2

## 2020-02-18 NOTE — Discharge Instructions (Signed)
1.  You were given your first dose of Keflex in the emergency department.  Fill your prescription tomorrow morning and start taking twice daily as prescribed. 2.  Follow-up with your doctor for recheck. 3.  Return to the emergency department immediately if you develop general weakness, nausea and vomiting, pain in your lower back or flanks, worsening symptoms or other concerning symptoms.

## 2020-02-18 NOTE — ED Triage Notes (Signed)
Patient reports she has discomfort in her private area. Patient says she feels like she has UTI. Endorses painful urination.

## 2020-02-18 NOTE — ED Provider Notes (Signed)
[facility] DEPT Provider Note   CSN: [id number] Arrival date & time: 02/18/20  1404     History Chief Complaint  Patient presents with  . Urinary Tract Infection    Abigail [name] is a 84 y.o. female.  HPI Patient reports she has been having some discomfort with urination for about 4 days.  She thought it was because she is doing more working out on the treadmill and that she was getting vaginal irritation from that activity.  She reports however she has had some burning with urination and some urgency.  Over the past couple days she started to feel little bit of malaise and thought she should get it checked.  No abdominal pain, no back pain or flank pain.  Patient reports she has been slightly more fatigued for a couple of days.  She reports she did measure a fever at home.  No cough or shortness of breath.  Patient reports she has a pessary.  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  Patient is not sexually active.    Past Medical History:  Diagnosis Date  . Carotid artery occlusion   . Complication of anesthesia    hard to wake up per pt  . Diverticulitis   . Hypertension   . Hypothyroid   . STEMI (ST elevation myocardial infarction) ([place]) 10/26/2019   DES RCA    Patient Active Problem List   Diagnosis Date Noted  . Chest pain with moderate risk for cardiac etiology 12/01/2019  . Urinary urgency 12/01/2019  . STEMI involving right coronary artery ([place]) 10/27/2019  . STEMI (ST elevation myocardial infarction) ([place]) 10/26/2019  . Anxiety 02/09/2019  . Prediabetes 12/29/2018  . Upper respiratory tract infection 10/21/2018  . Essential hypertension 08/27/2018  . Vitamin D deficiency 06/25/2018  . Hyperlipidemia 06/25/2018  . Hypothyroidism 06/25/2018  . Benign hypertension with CKD (chronic kidney disease) stage III 06/25/2018  . Chronic kidney disease, stage III (moderate) 06/25/2018  . OAB  (overactive bladder) 08/26/2016  . Complete uterine prolapse 06/21/2015  . CAP (community acquired pneumonia) 04/12/2012  . Bacteremia 04/08/2012  . Urinary tract infection without hematuria 04/05/2012  . Nausea and vomiting 04/05/2012  . LLQ abdominal pain 04/05/2012  . Acute renal failure ([place]) 04/05/2012  . Hyponatremia 04/05/2012    Past Surgical History:  Procedure Laterality Date  . BACK SURGERY  1980  . CORONARY/GRAFT ACUTE MI REVASCULARIZATION N/A 10/26/2019   Procedure: Coronary/Graft Acute MI Revascularization;  Surgeon: Sherren Mocha, MD;  Location: [place] [facility];  Service: Cardiovascular;  Laterality: N/A;  . ELBOW SURGERY     left  . FRACTURE SURGERY    . LEFT HEART CATH AND CORONARY ANGIOGRAPHY N/A 10/26/2019   Procedure: LEFT HEART CATH AND CORONARY ANGIOGRAPHY;  Surgeon: Sherren Mocha, MD;  Location: [facility];  Service: Cardiovascular;  Laterality: N/A;  . SPINE SURGERY       OB History    Gravida  2   Para  2   Term  2   Preterm      AB      Living  2     SAB      TAB      Ectopic      Multiple      Live Births              Family History  Problem Relation Age of Onset  . Hypertension Mother  Social History   Tobacco Use  . Smoking status: Former Smoker    Quit date: 04/05/1986    Years since quitting: 33.8  . Smokeless tobacco: Never Used  Vaping Use  . Vaping Use: Never used  Substance Use Topics  . Alcohol use: No  . Drug use: No    Home Medications Prior to Admission medications   Medication Sig Start Date End Date Taking? Authorizing Provider  aspirin EC 81 MG tablet Take 81 mg by mouth daily.   Yes [provider]  atorvastatin (LIPITOR) 80 MG tablet Take 1 tablet (80 mg total) by mouth daily at 6 PM. 10/28/19  Yes Duke, Tami Lin, PA  Cranberry 1000 MG CAPS Take 1 capsule by mouth daily at 12 noon.   Yes [provider]  docusate sodium (COLACE) 100 MG capsule Take 200-300 mg by  mouth daily as needed for mild constipation.    Yes [provider]  levothyroxine (SYNTHROID) 175 MCG tablet Take 1 tablet by mouth every am on Monday - Friday 01/12/20  Yes [name], FNP  lisinopril-hydrochlorothiazide (ZESTORETIC) 20-25 MG tablet Take 0.5 tablets by mouth 3 (three) times a week. Take on Monday Wednesday and Friday only 12/02/19  Yes Barrett, Evelene Croon, PA-C  metoprolol tartrate (LOPRESSOR) 25 MG tablet Take 0.5 tablets (12.5 mg total) by mouth 2 (two) times daily. 10/28/19  Yes Duke, Tami Lin, PA  nitroGLYCERIN (NITROSTAT) 0.4 MG SL tablet Place 1 tablet (0.4 mg total) under the tongue every 5 (five) minutes x 3 doses as needed for chest pain. 10/28/19  Yes Duke, Tami Lin, PA  oxybutynin (DITROPAN-XL) 10 MG 24 hr tablet TAKE 1 TABLET BY MOUTH EVERYDAY AT BEDTIME Patient taking differently: Take 10 mg by mouth at bedtime.  09/19/19  Yes [name], FNP  ticagrelor (BRILINTA) 90 MG TABS tablet Take 1 tablet (90 mg total) by mouth 2 (two) times daily. 10/28/19  Yes Duke, Tami Lin, PA  cephALEXin (KEFLEX) 500 MG capsule Take 2 capsules (1,000 mg total) by mouth 2 (two) times daily. 02/18/20   Charlesetta Shanks, MD    Allergies    Codeine and Norvasc [amlodipine]  Review of Systems   Review of Systems 10 systems reviewed and negative except per HPI Physical Exam Updated Vital Signs BP (!) 186/65 (BP Location: Left Arm)   Pulse 61   Temp 98.1 F (36.7 C) (Oral)   Resp 18   Ht 5\' 8"  (1.727 m)   Wt 63.5 kg   SpO2 97%   BMI 21.29 kg/m   Physical Exam Constitutional:      Comments: Alert nontoxic.  Well in appearance.  Clear mental status.  HENT:     Head: Normocephalic and atraumatic.  Eyes:     Extraocular Movements: Extraocular movements intact.  Cardiovascular:     Rate and Rhythm: Normal rate and regular rhythm.  Pulmonary:     Effort: Pulmonary effort is normal.     Breath sounds: Normal breath sounds.  Abdominal:     General: There is no  distension.     Palpations: Abdomen is soft.     Tenderness: There is no abdominal tenderness. There is no guarding.     Comments: No flank tenderness.  Genitourinary:    Comments: Mild diffuse inflammation of the vaginal mucosa.  Skin thinning.  No drainage from the vaginal canal.  Patient does have moisture consistent with some urinary incontinence.  Vaginal tissues are otherwise pliable without any evidence of abscess or  ulcerative lesions. Musculoskeletal:        General: No swelling or tenderness. Normal range of motion.  Skin:    General: Skin is warm and dry.  Neurological:     General: No focal deficit present.     Mental Status: She is oriented to person, place, and time.     Coordination: Coordination normal.  Psychiatric:        Mood and Affect: Mood normal.     ED Results / Procedures / Treatments   Labs (all labs ordered are listed, but only abnormal results are displayed) Labs Reviewed  URINALYSIS, ROUTINE W REFLEX MICROSCOPIC - Abnormal; Notable for the following components:      Result Value   Color, Urine STRAW (*)    Specific Gravity, Urine 1.003 (*)    Hgb urine dipstick LARGE (*)    Leukocytes,Ua LARGE (*)    Bacteria, UA RARE (*)    All other components within normal limits  URINE CULTURE    EKG None  Radiology No results found.  Procedures Procedures (including critical care time)  Medications Ordered in ED Medications  cephALEXin (KEFLEX) capsule 1,000 mg (has no administration in time range)    ED Course  I have reviewed the triage vital signs and the nursing notes.  Pertinent labs & imaging results that were available during my care of the patient were reviewed by me and considered in my medical decision making (see chart for details).  Clinical Course as of Feb 18 1851  Sat Feb 18, 2020  1804 Still do not have a urine result.  Apparently some problem in the lab with the specimen.  Patient is now recollecting another specimen.   [MP]     Clinical Course User Index [MP] Charlesetta Shanks, MD   MDM Rules/Calculators/A&P                          Patient presents with vaginal irritation which she attributed to increased exercise on a treadmill.  She however started to have some dysuria and some general malaise over the past 2 days.  She is clinically well in appearance.  She is afebrile with stable vital signs.  No flank pain or abdominal pain.  Urinalysis is grossly positive consistent with UTI.  Will start on twice daily Keflex.  Return precautions reviewed. Final Clinical Impression(s) / ED Diagnoses Final diagnoses:  Acute cystitis without hematuria    Rx / DC Orders ED Discharge Orders         Ordered    cephALEXin (KEFLEX) 500 MG capsule  2 times daily     Discontinue  Reprint     02/18/20 1845           Charlesetta Shanks, MD 02/18/20 [phone]

## 2020-02-20 ENCOUNTER — Ambulatory Visit: Payer: Self-pay

## 2020-02-20 NOTE — Chronic Care Management (AMB) (Addendum)
  Care Management    Consult Note  02/20/2020 Name: Abigail [name] MRN: [id number] DOB: 1935-08-28  Care management team received notification of patient's recent emergency department visit related to Urinary Tract Infection .Based on review of health record, Abigail [name] is currently active in the embedded care coordination program.   Review of patient status, including review of consultants reports, relevant laboratory and other test results, and collaboration with appropriate care team members and the patient's provider was performed as part of comprehensive patient evaluation and provision of chronic care management services.    Plan: SW collaboration with RN Care Manager regarding ED visit. Next RN outreach scheduled for 02/23/20.  Daneen Schick, BSW, CDP Social Worker, Certified Dementia Practitioner [place] [facility] [phone]

## 2020-02-21 LAB — URINE CULTURE: Culture: >=100000 — AB

## 2020-02-22 ENCOUNTER — Telehealth: Payer: Self-pay | Admitting: Emergency Medicine

## 2020-02-22 NOTE — Telephone Encounter (Signed)
Post ED Visit - Positive Culture Follow-up  Culture report reviewed by antimicrobial stewardship pharmacist: [place] Team []  Elenor Quinones, Pharm.D. []  Heide Guile, Pharm.D., BCPS AQ-ID []  Parks Neptune, Pharm.D., BCPS []  Alycia Rossetti, Pharm.D., BCPS []  [place], [place].D., BCPS, AAHIVP []  Legrand Como, Pharm.D., BCPS, AAHIVP []  Salome Arnt, PharmD, BCPS []  Johnnette Gourd, PharmD, BCPS []  Hughes Better, PharmD, BCPS []  Leeroy Cha, PharmD []  Laqueta Linden, PharmD, BCPS []  Albertina Parr, PharmD  [place] Team []  Leodis Sias, PharmD []  Lindell Spar, PharmD []  Royetta Asal, PharmD []  Graylin Shiver, Rph []  Rema Fendt) Glennon Mac, PharmD []  Arlyn Dunning, PharmD []  Netta Cedars, PharmD []  Dia Sitter, PharmD []  Leone Haven, PharmD []  Gretta Arab, PharmD []  Theodis Shove, PharmD []  Peggyann Juba, PharmD []  Reuel Boom, PharmD   Positive urine culture Treated with cephalexin, organism sensitive to the same and no further patient follow-up is required at this time.  Hazle Nordmann 02/22/2020, 12:35 PM

## 2020-02-23 ENCOUNTER — Other Ambulatory Visit: Payer: Self-pay

## 2020-02-23 ENCOUNTER — Ambulatory Visit (INDEPENDENT_AMBULATORY_CARE_PROVIDER_SITE_OTHER): Payer: PPO

## 2020-02-23 ENCOUNTER — Telehealth: Payer: PPO

## 2020-02-23 DIAGNOSIS — I10 Essential (primary) hypertension: Secondary | ICD-10-CM

## 2020-02-23 DIAGNOSIS — R7303 Prediabetes: Secondary | ICD-10-CM

## 2020-02-23 DIAGNOSIS — F419 Anxiety disorder, unspecified: Secondary | ICD-10-CM

## 2020-02-23 DIAGNOSIS — E785 Hyperlipidemia, unspecified: Secondary | ICD-10-CM | POA: Diagnosis not present

## 2020-02-23 DIAGNOSIS — E039 Hypothyroidism, unspecified: Secondary | ICD-10-CM

## 2020-02-28 NOTE — Chronic Care Management (AMB) (Signed)
Chronic Care Management   Follow Up Note   02/24/2020 Name: Abigail [name] MRN: [id number] DOB: 05/19/1936  Referred by: [name], FNP Reason for referral : Chronic Care Management (FU RN CM Call )   Abigail [name] is a 84 y.o. year old female who is a primary care patient of [name], [place]. The CCM team was consulted for assistance with chronic disease management and care coordination needs.    Review of patient status, including review of consultants reports, relevant laboratory and other test results, and collaboration with appropriate care team members and the patient's provider was performed as part of comprehensive patient evaluation and provision of chronic care management services.    SDOH (Social Determinants of Health) assessments performed: Yes - no acute challenges  See Care Plan activities for detailed interventions related to [place])    Placed outbound CCM RN CM follow up call to patient regarding an Urgent Care visit.   Outpatient Encounter Medications as of 02/23/2020  Medication Sig  . levothyroxine (SYNTHROID) 175 MCG tablet Take 1 tablet by mouth every am on Monday - Friday  . aspirin EC 81 MG tablet Take 81 mg by mouth daily.  [name] Kitchen atorvastatin (LIPITOR) 80 MG tablet Take 1 tablet (80 mg total) by mouth daily at 6 PM.  . cephALEXin (KEFLEX) 500 MG capsule Take 2 capsules (1,000 mg total) by mouth 2 (two) times daily.  . Cranberry 1000 MG CAPS Take 1 capsule by mouth daily at 12 noon.  . docusate sodium (COLACE) 100 MG capsule Take 200-300 mg by mouth daily as needed for mild constipation.   [name] Kitchen lisinopril-hydrochlorothiazide (ZESTORETIC) 20-25 MG tablet Take 0.5 tablets by mouth 3 (three) times a week. Take on Monday Wednesday and Friday only  . metoprolol tartrate (LOPRESSOR) 25 MG tablet Take 0.5 tablets (12.5 mg total) by mouth 2 (two) times daily.  . nitroGLYCERIN (NITROSTAT) 0.4 MG SL tablet Place 1 tablet (0.4 mg total) under the tongue every 5 (five) minutes x 3  doses as needed for chest pain.  [name] Kitchen oxybutynin (DITROPAN-XL) 10 MG 24 hr tablet TAKE 1 TABLET BY MOUTH EVERYDAY AT BEDTIME (Patient taking differently: Take 10 mg by mouth at bedtime. )  . ticagrelor (BRILINTA) 90 MG TABS tablet Take 1 tablet (90 mg total) by mouth 2 (two) times daily.   Facility-Administered Encounter Medications as of 02/23/2020  Medication  . Ring Pessary/Support MISC 1 each     Objective:  Lab Results  Component Value Date   HGBA1C 5.8 (H) 10/26/2019   HGBA1C 5.8 (H) 06/30/2019   HGBA1C 5.8 (H) 06/25/2018   Lab Results  Component Value Date   MICROALBUR 80 01/11/2020   LDLCALC 110 (H) 10/26/2019   CREATININE 1.18 (H) 01/11/2020   BP Readings from Last 3 Encounters:  02/18/20 (!) 186/79  01/11/20 140/70  01/11/20 140/70    Goals Addressed      Patient Stated   .  COMPLETED: "I am suppose to start Cardiac Rehab" (pt-stated)        CARE PLAN ENTRY (see longitudinal plan of care for additional care plan information)  Current Barriers:  [name] Kitchen Knowledge Deficits related to disease process and Self Health management s/p STEMI 10/26/2019  . Chronic Disease Management support and education needs related to Acquired hypothyroidism, Anxiety, Essential hypertension, Prediabetes, Hyperlipidemia  Nurse Case Manager Clinical Goal(s):  [name] Kitchen Over the next 90 days, patient will work with CCM RN CM and PCP to address needs related to disease education and  support for improved Self Health management of s/p STEMI involving right coronary artery   CCM RN CM Interventions:  02/24/20 call completed with patient  . Inter-disciplinary care team collaboration (see longitudinal plan of care) . Evaluation of current treatment plan related to CAD, s/p STEMI with right coronary artery occlusion and patient's adherence to plan as established by provider . Determined patient f/u with Dr. Irish Lack on 01/09/20; Discussed and reviewed the following Assessment/Plan:  o ASSESSMENT AND PLAN: o 1.    CAD: Continue aggressive secondary prevention.  Angina controlled on medical therapy.  Residual disease in the mid and distal RCA, not treated at the time of initial cath.  No angina. Continue aggressive secondary prevention. Could stop aspirin if bleeding is a problem and continue Brilinta monotherapy.  o 2.   Hyperlipidemia: Whole food, plant based diet recommended.  Lipids to be checked with PMD later this week.  1. HTN: Low salt diet with regular exercise as noted below.  She has a BP cuff at home.  BP has improved since decreasing salt.   2. Hyponatermia: Noted in the past.  3. Hypothyroid:  Dose of med was decreased due to decreased TSH.  o Current medicines are reviewed at length with the patient today.  The patient concerns regarding her medicines were addressed. o The following changes have been made:  No change o Labs/ tests ordered today include:  No orders of the defined types were placed in this encounter. o Recommend 150 minutes/week of aerobic exercise o Low fat, low carb, high fiber diet recommended o Disposition:   FU in 6 months . Discussed and determined patient has resumed attending her local gym three times weekly and will not participate in [place], patient states this was discussed with [place] and he was in agreement this would be sufficient  . Discussed plans with patient for ongoing care management follow up and provided patient with direct contact information for care management team  Patient Self Care Activities:  . Self administers medications as prescribed . Attends all scheduled provider appointments . Calls pharmacy for medication refills . Performs ADL's independently . Performs IADL's independently . Calls provider office for new concerns or questions  Please see past updates related to this goal by clicking on the "Past Updates" button in the selected goal      .  "I went to Urgent Care for a UTI" (pt-stated)   On track     [place] (see  longitudinal plan of care for additional care plan information)  Current Barriers:  [name] Kitchen Knowledge Deficits related to ways to improve and maintain Urinary Tract Health  . Chronic Disease Management support and education needs related to Acquired hypothyroidism, Anxiety, Essential hypertension, Prediabetes, Hyperlipidemia  Nurse Case Manager Clinical Goal(s):  [name] Kitchen Over the next 90 days, patient will work with the CCM team and PCP to address needs related to diagnosis and treatment for Cystitis and recurrent UTI  CCM RN CM Interventions:  02/24/20 call completed with patient  . Inter-disciplinary care team collaboration (see longitudinal plan of care) . Evaluation of current treatment plan related to Cystitis and recurrent UTI and patient's adherence to plan as established by provider . Determined patient completed an Urgent Care visit on 02/18/20 for s/s of UTI: Reviewed and discussed the following Assessment/Plan:  o Patient presents with vaginal irritation which she attributed to increased exercise on a treadmill.  She however started to have some dysuria and some general malaise over the past 2  days.She is clinically well in appearance.  She is afebrile with stable vital signs.  No flank pain or abdominal pain. Urinalysis is grossly positive consistent with UTI.  Will start on twice daily Keflex. Return precautions reviewed. o Final Clinical Impression(s) / ED Diagnoses o Final diagnoses: o  Acute cystitis without hematuria   o Rx / DC Orders o  o  o  ED Discharge Orders  o    o  o  o  o  o  o    o   o   o Ordered o  o    o   o cephALEXin (KEFLEX) 500 MG capsule  2 times daily o   o  o  o     . Reviewed and discussed abnormal urine culture indicates Escherichia Coli; Educated on importance of completing full course of antibiotics . Recommended patient drop off a urine sample at PCP office to retest urine approximately 2 weeks after completing her antibiotic . Provided education to patient  re: ways to improve Self Health management to maintain good Urinary health; Educated on early s/s suggestive of UTI and discussed importance of reporting symptoms promptly in order to seek early treatment . Discussed plans with patient for ongoing care management follow up and provided patient with direct contact information for care management team  Patient Self Care Activities:  . Patient verbalizes understanding of plan to have urine rechecked for clearance of UTI approximately 2 weeks after completing her full course of antibiotics  . Self administers medications as prescribed . Attends all scheduled provider appointments . Calls pharmacy for medication refills . Performs ADL's independently . Performs IADL's independently . Calls provider office for new concerns or questions  Initial goal documentation     .  COMPLETED: "to learn more about how to manage my chronic conditions' (pt-stated)        Current Barriers:  [name] Kitchen Knowledge Deficits related to disease process and Self Health management of Hypothyroidism . Chronic Disease Management support and education needs related to Acquired Hypothyroidism, Essential Hypertension, Pre-diabetes, Hyperlipidemia  Nurse Case Manager Clinical Goal(s):  [name] Kitchen Over the next 90 days, patient will work with the [place] CM and PCP to address needs related to disease education and support for Acquired hypothyroidism   CCM RN CM Interventions:  02/24/20 call completed with patient  . Evaluation of current treatment plan related to Acquired hypothyroidism and patient's adherence to plan as established by provider . Discussed and reviewed patient's recent TSH levels obtained and noted levels are within normal limits at this time . Discussed and reviewed patient's current medication regimen; o Levothyroxine (Synthroid) 175 mcg, take 1 tablet daily Monday-Friday o Reiterated taking Levothyroxine as directed at the same time before breakfast. o Take Synthroid with  only water and on an empty stomach. o Wait 30 minutes to 1 hour before eating or drinking anything other than water. . Discussed plans with patient for ongoing care management follow up and provided patient with direct contact information for care management team  Patient Self Care Activities:  . Self administers medications as prescribed . Attends all scheduled provider appointments . Calls pharmacy for medication refills . Performs ADL's independently . Performs IADL's independently . Calls provider office for new concerns or questions  Please see past updates related to this goal by clicking on the "Past Updates" button in the selected goal         Plan:   Telephone follow up appointment with care management team  member scheduled for: 05/17/20  Barb Merino, RN, BSN, CCM Care Management Coordinator [place] [facility]  Direct Phone: [phone]

## 2020-02-28 NOTE — Patient Instructions (Signed)
Visit Information  Goals Addressed      Patient Stated   .  COMPLETED: "I am suppose to start Cardiac Rehab" (pt-stated)        CARE PLAN ENTRY (see longitudinal plan of care for additional care plan information)  Current Barriers:  [name] Kitchen Knowledge Deficits related to disease process and Self Health management s/p STEMI 10/26/2019  . Chronic Disease Management support and education needs related to Acquired hypothyroidism, Anxiety, Essential hypertension, Prediabetes, Hyperlipidemia  Nurse Case Manager Clinical Goal(s):  [name] Kitchen Over the next 90 days, patient will work with CCM RN CM and PCP to address needs related to disease education and support for improved Self Health management of s/p STEMI involving right coronary artery   CCM RN CM Interventions:  02/24/20 call completed with patient  . Inter-disciplinary care team collaboration (see longitudinal plan of care) . Evaluation of current treatment plan related to CAD, s/p STEMI with right coronary artery occlusion and patient's adherence to plan as established by provider . Determined patient f/u with Dr. Irish Lack on 01/09/20; Discussed and reviewed the following Assessment/Plan:  o ASSESSMENT AND PLAN: o 1.   CAD: Continue aggressive secondary prevention.  Angina controlled on medical therapy.  Residual disease in the mid and distal RCA, not treated at the time of initial cath.  No angina. Continue aggressive secondary prevention. Could stop aspirin if bleeding is a problem and continue Brilinta monotherapy.  o 2.   Hyperlipidemia: Whole food, plant based diet recommended.  Lipids to be checked with PMD later this week.  1. HTN: Low salt diet with regular exercise as noted below.  She has a BP cuff at home.  BP has improved since decreasing salt.   2. Hyponatermia: Noted in the past.  3. Hypothyroid:  Dose of med was decreased due to decreased TSH.  o Current medicines are reviewed at length with the patient today.  The patient concerns  regarding her medicines were addressed. o The following changes have been made:  No change o Labs/ tests ordered today include:  No orders of the defined types were placed in this encounter. o Recommend 150 minutes/week of aerobic exercise o Low fat, low carb, high fiber diet recommended o Disposition:   FU in 6 months . Discussed and determined patient has resumed attending her local gym three times weekly and will not participate in [place], patient states this was discussed with [place] and he was in agreement this would be sufficient  . Discussed plans with patient for ongoing care management follow up and provided patient with direct contact information for care management team  Patient Self Care Activities:  . Self administers medications as prescribed . Attends all scheduled provider appointments . Calls pharmacy for medication refills . Performs ADL's independently . Performs IADL's independently . Calls provider office for new concerns or questions  Please see past updates related to this goal by clicking on the "Past Updates" button in the selected goal      .  "I went to Urgent Care for a UTI" (pt-stated)   On track     [place] (see longitudinal plan of care for additional care plan information)  Current Barriers:  [name] Kitchen Knowledge Deficits related to ways to improve and maintain Urinary Tract Health  . Chronic Disease Management support and education needs related to Acquired hypothyroidism, Anxiety, Essential hypertension, Prediabetes, Hyperlipidemia  Nurse Case Manager Clinical Goal(s):  [name] Kitchen Over the next 90 days, patient will work with the CCM team  and PCP to address needs related to diagnosis and treatment for Cystitis and recurrent UTI  CCM RN CM Interventions:  02/24/20 call completed with patient  . Inter-disciplinary care team collaboration (see longitudinal plan of care) . Evaluation of current treatment plan related to Cystitis and recurrent UTI and  patient's adherence to plan as established by provider . Determined patient completed an Urgent Care visit on 02/18/20 for s/s of UTI: Reviewed and discussed the following Assessment/Plan:  o Patient presents with vaginal irritation which she attributed to increased exercise on a treadmill.  She however started to have some dysuria and some general malaise over the past 2 days.She is clinically well in appearance.  She is afebrile with stable vital signs.  No flank pain or abdominal pain. Urinalysis is grossly positive consistent with UTI.  Will start on twice daily Keflex. Return precautions reviewed. o Final Clinical Impression(s) / ED Diagnoses o Final diagnoses: o  Acute cystitis without hematuria   o Rx / DC Orders o  o  o  ED Discharge Orders  o    o  o  o  o  o  o    o   o   o Ordered o  o    o   o cephALEXin (KEFLEX) 500 MG capsule  2 times daily o   o  o  o     . Reviewed and discussed abnormal urine culture indicates Escherichia Coli; Educated on importance of completing full course of antibiotics . Recommended patient drop off a urine sample at PCP office to retest urine approximately 2 weeks after completing her antibiotic . Provided education to patient re: ways to improve Self Health management to maintain good Urinary health; Educated on early s/s suggestive of UTI and discussed importance of reporting symptoms promptly in order to seek early treatment . Discussed plans with patient for ongoing care management follow up and provided patient with direct contact information for care management team  Patient Self Care Activities:  . Patient verbalizes understanding of plan to have urine rechecked for clearance of UTI approximately 2 weeks after completing her full course of antibiotics  . Self administers medications as prescribed . Attends all scheduled provider appointments . Calls pharmacy for medication refills . Performs ADL's independently . Performs IADL's  independently . Calls provider office for new concerns or questions  Initial goal documentation     .  COMPLETED: "to learn more about how to manage my chronic conditions' (pt-stated)        Current Barriers:  [name] Kitchen Knowledge Deficits related to disease process and Self Health management of Hypothyroidism . Chronic Disease Management support and education needs related to Acquired Hypothyroidism, Essential Hypertension, Pre-diabetes, Hyperlipidemia  Nurse Case Manager Clinical Goal(s):  [name] Kitchen Over the next 90 days, patient will work with the [place] CM and PCP to address needs related to disease education and support for Acquired hypothyroidism   CCM RN CM Interventions:  02/24/20 call completed with patient  . Evaluation of current treatment plan related to Acquired hypothyroidism and patient's adherence to plan as established by provider . Discussed and reviewed patient's recent TSH levels obtained and noted levels are within normal limits at this time . Discussed and reviewed patient's current medication regimen; o Levothyroxine (Synthroid) 175 mcg, take 1 tablet daily Monday-Friday o Reiterated taking Levothyroxine as directed at the same time before breakfast. o Take Synthroid with only water and on an empty stomach. o Wait 30 minutes to 1 hour  before eating or drinking anything other than water. . Discussed plans with patient for ongoing care management follow up and provided patient with direct contact information for care management team  Patient Self Care Activities:  . Self administers medications as prescribed . Attends all scheduled provider appointments . Calls pharmacy for medication refills . Performs ADL's independently . Performs IADL's independently . Calls provider office for new concerns or questions  Please see past updates related to this goal by clicking on the "Past Updates" button in the selected goal        Patient verbalizes understanding of instructions  provided today.   Telephone follow up appointment with care management team member scheduled for: 05/17/20  Barb Merino, RN, BSN, CCM Care Management Coordinator [place] [facility]  Direct Phone: [phone]

## 2020-03-01 ENCOUNTER — Ambulatory Visit (INDEPENDENT_AMBULATORY_CARE_PROVIDER_SITE_OTHER): Payer: PPO | Admitting: Nurse Practitioner

## 2020-03-01 ENCOUNTER — Other Ambulatory Visit: Payer: Self-pay

## 2020-03-01 ENCOUNTER — Encounter: Payer: Self-pay | Admitting: Nurse Practitioner

## 2020-03-01 VITALS — BP 130/76 | HR 47 | Temp 97.7°F | Ht 60.8 in | Wt 145.0 lb

## 2020-03-01 DIAGNOSIS — R001 Bradycardia, unspecified: Secondary | ICD-10-CM | POA: Diagnosis not present

## 2020-03-01 DIAGNOSIS — R3129 Other microscopic hematuria: Secondary | ICD-10-CM | POA: Diagnosis not present

## 2020-03-01 DIAGNOSIS — N390 Urinary tract infection, site not specified: Secondary | ICD-10-CM | POA: Diagnosis not present

## 2020-03-01 DIAGNOSIS — Z4689 Encounter for fitting and adjustment of other specified devices: Secondary | ICD-10-CM

## 2020-03-01 LAB — POCT URINALYSIS DIPSTICK
Bilirubin, UA: NEGATIVE
Glucose, UA: NEGATIVE
Ketones, UA: NEGATIVE
Nitrite, UA: NEGATIVE
Protein, UA: NEGATIVE
Spec Grav, UA: 1.02 (ref 1.010–1.025)
Urobilinogen, UA: 0.2 E.U./dL
pH, UA: 6 (ref 5.0–8.0)

## 2020-03-01 NOTE — Progress Notes (Signed)
This visit occurred during the SARS-CoV-2 public health emergency.  Safety protocols were in place, including screening questions prior to the visit, additional usage of staff PPE, and extensive cleaning of exam room while observing appropriate contact time as indicated for disinfecting solutions.  Subjective:     Patient ID: Abigail [name] , female    DOB: December 14, 1935 , 84 y.o.   MRN: [id number]   Chief Complaint  Patient presents with  . uti f/u    patient feels she may have a uti. she stated she just doesnt feel well today     HPI  She tells me today she has a pessary    Past Medical History:  Diagnosis Date  . Carotid artery occlusion   . Complication of anesthesia    hard to wake up per pt  . Diverticulitis   . Hypertension   . Hypothyroid   . STEMI (ST elevation myocardial infarction) ([place]) 10/26/2019   DES RCA     Family History  Problem Relation Age of Onset  . Hypertension Mother      Current Outpatient Medications:  .  aspirin EC 81 MG tablet, Take 81 mg by mouth daily., Disp: , Rfl:  .  atorvastatin (LIPITOR) 80 MG tablet, Take 1 tablet (80 mg total) by mouth daily at 6 PM., Disp: 90 tablet, Rfl: 3 .  Cranberry 1000 MG CAPS, Take 1 capsule by mouth daily at 12 noon., Disp: , Rfl:  .  docusate sodium (COLACE) 100 MG capsule, Take 200-300 mg by mouth daily as needed for mild constipation. , Disp: , Rfl:  .  levothyroxine (SYNTHROID) 175 MCG tablet, Take 1 tablet by mouth every am on Monday - Friday, Disp: 90 tablet, Rfl: 1 .  lisinopril-hydrochlorothiazide (ZESTORETIC) 20-25 MG tablet, Take 0.5 tablets by mouth 3 (three) times a week. Take on Monday Wednesday and Friday only, Disp: 90 tablet, Rfl: 1 .  metoprolol tartrate (LOPRESSOR) 25 MG tablet, Take 0.5 tablets (12.5 mg total) by mouth 2 (two) times daily., Disp: 180 tablet, Rfl: 3 .  nitroGLYCERIN (NITROSTAT) 0.4 MG SL tablet, Place 1 tablet (0.4 mg total) under the tongue every 5 (five) minutes x 3 doses as  needed for chest pain., Disp: 25 tablet, Rfl: 3 .  oxybutynin (DITROPAN-XL) 10 MG 24 hr tablet, TAKE 1 TABLET BY MOUTH EVERYDAY AT BEDTIME (Patient taking differently: Take 10 mg by mouth at bedtime. ), Disp: 90 tablet, Rfl: 1 .  ticagrelor (BRILINTA) 90 MG TABS tablet, Take 1 tablet (90 mg total) by mouth 2 (two) times daily., Disp: 180 tablet, Rfl: 3  Current Facility-Administered Medications:  .  Ring Pessary/Support MISC 1 each, 1 each, Does not apply, Once, Anyanwu, Sallyanne Havers, MD   Allergies  Allergen Reactions  . Codeine Nausea And Vomiting  . Norvasc [Amlodipine] Rash and Other (See Comments)    [place]     Review of Systems  Constitutional: Negative.   Respiratory: Negative.   Cardiovascular: Negative.  Negative for chest pain, palpitations and leg swelling.  Neurological: Negative for dizziness and headaches.  Psychiatric/Behavioral: Negative.      Today's Vitals   03/01/20 0906  BP: 130/76  Pulse: (!) 47  Temp: 97.7 F (36.5 C)  TempSrc: Oral  Weight: 145 lb (65.8 kg)  Height: 5' 0.8" (1.544 m)  PainSc: 0-No pain   Body mass index is 27.58 kg/m.   Objective:  Physical Exam Constitutional:      General: She is not in acute distress.  Appearance: Normal appearance.  Cardiovascular:     Rate and Rhythm: Normal rate and regular rhythm.     Pulses: Normal pulses.     Heart sounds: Normal heart sounds. No murmur heard.   Pulmonary:     Effort: Pulmonary effort is normal. No respiratory distress.     Breath sounds: Normal breath sounds.  Neurological:     General: No focal deficit present.     Mental Status: She is alert and oriented to person, place, and time.  Psychiatric:        Mood and Affect: Mood normal.        Behavior: Behavior normal.        Thought Content: Thought content normal.        Judgment: Judgment normal.         Assessment And Plan:     1. Frequent urinary tract infections  Recently treated for UTI, this is her 3rd UTI in  about 4 months  Recheck urinalysis was moderate blood and large leukocytes  Will send for culture - POCT Urinalysis Dipstick (21031) - Ambulatory referral to Gynecology  2. History of removal of pessary  This may be the cause of her frequent urinary tract infections - Ambulatory referral to Gynecology  3. Bradycardia  She is to hold her metoprolol until Tuesday and recheck her heart rate and blood pressure, with the suggestion from Dr Charmian Muff   [name], [place] DISTANCING DUE TO THE COVID-19 PANDEMIC.

## 2020-03-06 ENCOUNTER — Other Ambulatory Visit: Payer: Self-pay

## 2020-03-06 ENCOUNTER — Ambulatory Visit: Payer: PPO

## 2020-03-06 VITALS — BP 136/74 | HR 73 | Temp 98.1°F | Ht 60.8 in | Wt 145.0 lb

## 2020-03-06 DIAGNOSIS — I10 Essential (primary) hypertension: Secondary | ICD-10-CM

## 2020-03-06 DIAGNOSIS — R001 Bradycardia, unspecified: Secondary | ICD-10-CM

## 2020-03-06 DIAGNOSIS — N390 Urinary tract infection, site not specified: Secondary | ICD-10-CM | POA: Diagnosis not present

## 2020-03-06 NOTE — Progress Notes (Addendum)
Pt presents today for a b/p check and heart rate check 136/74 pt is currently on lisinopril hydrochlorothiazide (Zestoretic) 20-25 MG  Message sent to provider   Per JM: Yes i dont think we ran it, her blood pressure and heart rate is better continue to stay off the metoprolol and she needs to make sure to follow up with the cardiologist

## 2020-03-07 LAB — URINE CULTURE

## 2020-03-09 ENCOUNTER — Ambulatory Visit: Payer: PPO | Admitting: Obstetrics & Gynecology

## 2020-03-16 DIAGNOSIS — Z03818 Encounter for observation for suspected exposure to other biological agents ruled out: Secondary | ICD-10-CM | POA: Diagnosis not present

## 2020-03-16 DIAGNOSIS — Z20822 Contact with and (suspected) exposure to covid-19: Secondary | ICD-10-CM | POA: Diagnosis not present

## 2020-03-23 ENCOUNTER — Ambulatory Visit: Payer: PPO | Admitting: Obstetrics & Gynecology

## 2020-03-23 ENCOUNTER — Encounter: Payer: Self-pay | Admitting: Obstetrics & Gynecology

## 2020-03-23 ENCOUNTER — Other Ambulatory Visit: Payer: Self-pay

## 2020-03-23 VITALS — BP 130/84 | Ht 63.0 in | Wt 147.0 lb

## 2020-03-23 DIAGNOSIS — Z4689 Encounter for fitting and adjustment of other specified devices: Secondary | ICD-10-CM

## 2020-03-23 DIAGNOSIS — N765 Ulceration of vagina: Secondary | ICD-10-CM

## 2020-03-23 DIAGNOSIS — N813 Complete uterovaginal prolapse: Secondary | ICD-10-CM | POA: Diagnosis not present

## 2020-03-23 DIAGNOSIS — N3000 Acute cystitis without hematuria: Secondary | ICD-10-CM | POA: Diagnosis not present

## 2020-03-23 DIAGNOSIS — N950 Postmenopausal bleeding: Secondary | ICD-10-CM

## 2020-03-23 MED ORDER — SULFAMETHOXAZOLE-TRIMETHOPRIM 400-80 MG PO TABS: 1.0000 | ORAL_TABLET | Freq: Two times a day (BID) | ORAL | 0 refills | Status: AC

## 2020-03-23 NOTE — Progress Notes (Signed)
Abigail [name] 04-19-1936 [id number]   History:    84 y.o. G2P2L2  RP:  New patient presenting for Pessary management/Recurrent UTI and h/o Lt Bartholin Gland Cyst  HPI: Uterine prolapse managed with a Milex ring #7 with support.  The pessary has not been removed for cleaning for at least 2 years.  Patient has experienced vaginal spotting recently.  No pelvic or vaginal pain.  No fever.  Frequent recurrent UTIs, last one about 3 weeks ago treated with Keflex.  H/O Lt Bartholin's gland cyst drained many yrs ago.  Patient thinks she has it again, but no pain or drainage.  Past medical history,surgical history, family history and social history were all reviewed and documented in the EPIC chart.  Gynecologic History No LMP recorded. Patient is postmenopausal.  Obstetric History OB History  Gravida Para Term Preterm AB Living  2 2 2     2   SAB TAB Ectopic Multiple Live Births               # Outcome Date GA Lbr Len/2nd Weight Sex Delivery Anes PTL Lv  2 Term           1 Term              ROS: A ROS was performed and pertinent positives and negatives are included in the history.  GENERAL: No fevers or chills. HEENT: No change in vision, no earache, sore throat or sinus congestion. NECK: No pain or stiffness. CARDIOVASCULAR: No chest pain or pressure. No palpitations. PULMONARY: No shortness of breath, cough or wheeze. GASTROINTESTINAL: No abdominal pain, nausea, vomiting or diarrhea, melena or bright red blood per rectum. GENITOURINARY: No urinary frequency, urgency, hesitancy or dysuria. MUSCULOSKELETAL: No joint or muscle pain, no back pain, no recent trauma. DERMATOLOGIC: No rash, no itching, no lesions. ENDOCRINE: No polyuria, polydipsia, no heat or cold intolerance. No recent change in weight. HEMATOLOGICAL: No anemia or easy bruising or bleeding. NEUROLOGIC: No headache, seizures, numbness, tingling or weakness. PSYCHIATRIC: No depression, no loss of interest in normal activity or  change in sleep pattern.     Exam:   BP (!) 130/84   Ht 5\' 3"  (1.6 m)   Wt 147 lb (66.7 kg)   BMI 26.04 kg/m   Body mass index is 26.04 kg/m.  General appearance : Well developed well nourished female. No acute distress  Pelvic: Vulva: Normal.  No Bartholin gland cyst.             Vagina: Pessary removed and cleaned.  Bleeding at removal.  Ulcerations present in the vagina.  Cervix: No gross lesions or discharge  Uterus  AV, normal size, shape and consistency, non-tender and mobile  Adnexa  Without masses or tenderness  Anus: Normal  U/A: Yellow cloudy, protein trace, nitrites negative, white blood cells more than 60, red blood cells more than 60, bacteria many.  Urine culture pending.   Assessment/Plan:  84 y.o. female   1. Complete uterine prolapse Complete uterine prolapse managed with pessary.  Pessary not removed for cleaning for at least 2 years.  Using a Milex ring #7 with support.  Probably too large as it is causing ulcerations of the vaginal mucosa.  Pessary not put back in place.  2. Pessary maintenance Milex ring #7 with support.  Probably too large, causing ulcerations.  Not put back in place.  3. Vaginal ulceration Vaginal ulcerations secondary to pessary.  Will let the ulcerations heal without the pessary in place.  Patient will follow up in about 4 weeks for reevaluation.  If she still needs a pessary, will fit a smaller one.  4. Postmenopausal bleeding Postmenopausal bleeding probably secondary to vaginal ulcerations due to the pessary.  Will rule out endometrial pathology with a pelvic ultrasound. - US Transvaginal Non-OB; Future  5. Acute recurrent cystitis Acute cystitis per urine analysis today.  Will treat with Bactrim 1 tablet twice a day for 3 days.  Pending urine culture.  Other orders - sulfamethoxazole-trimethoprim (BACTRIM) 400-80 MG tablet; Take 1 tablet by mouth 2 (two) times daily for 3 days.  [name] MD, 11:08 AM  03/23/2020

## 2020-03-25 LAB — URINALYSIS, COMPLETE W/RFL CULTURE
Bilirubin Urine: NEGATIVE
Glucose, UA: NEGATIVE
Hyaline Cast: NONE SEEN /LPF
Ketones, ur: NEGATIVE
Nitrites, Initial: NEGATIVE
RBC / HPF: >=60 /HPF — AB (ref 0–2)
Specific Gravity, Urine: 1.015 (ref 1.001–1.03)
WBC, UA: >=60 /HPF — AB (ref 0–5)
pH: 7 (ref 5.0–8.0)

## 2020-03-25 LAB — URINE CULTURE
MICRO NUMBER:: 10770130
SPECIMEN QUALITY:: ADEQUATE

## 2020-03-25 LAB — CULTURE INDICATED

## 2020-04-02 ENCOUNTER — Other Ambulatory Visit: Payer: Self-pay | Admitting: Nurse Practitioner

## 2020-04-02 DIAGNOSIS — N3281 Overactive bladder: Secondary | ICD-10-CM

## 2020-04-13 ENCOUNTER — Other Ambulatory Visit: Payer: Self-pay

## 2020-04-13 ENCOUNTER — Encounter: Payer: Self-pay | Admitting: Obstetrics & Gynecology

## 2020-04-13 ENCOUNTER — Encounter: Payer: Self-pay | Admitting: Gynecology

## 2020-04-13 ENCOUNTER — Ambulatory Visit: Payer: PPO | Admitting: Obstetrics & Gynecology

## 2020-04-13 VITALS — BP 140/90

## 2020-04-13 DIAGNOSIS — N765 Ulceration of vagina: Secondary | ICD-10-CM

## 2020-04-13 DIAGNOSIS — N3000 Acute cystitis without hematuria: Secondary | ICD-10-CM

## 2020-04-13 DIAGNOSIS — N950 Postmenopausal bleeding: Secondary | ICD-10-CM

## 2020-04-13 DIAGNOSIS — N813 Complete uterovaginal prolapse: Secondary | ICD-10-CM | POA: Diagnosis not present

## 2020-04-13 MED ORDER — SULFAMETHOXAZOLE-TRIMETHOPRIM 400-80 MG PO TABS: 1.0000 | ORAL_TABLET | Freq: Two times a day (BID) | ORAL | 0 refills | Status: AC

## 2020-04-13 NOTE — Progress Notes (Signed)
    Abigail [name] [birth year] [id number]        84 y.o.  G2P2L2   RP: Pain with urination/frequency and difficulty passing urine  HPI: Complete Uterine prolapse which was managed with a pessary, but the pessary was too large, a Milex ring #7, it was causing vaginal erosion/ulceration.  The pessary was therefore removed at last visit 03/23/2020. Patient had a recurrence of an Acute Cystitis treated with Bactrim.  Remained asymptomatic with no vaginal bleeding until yesterday when she saw a small amount of bleed and started experiencing pain with urination, frequency and difficulty passing urine.  No fever.  At the 03/23/2020 visit we noted: Uterine prolapse managed with a Milex ring #7 with support.  The pessary has not been removed for cleaning for at least 2 years.  Patient has experienced vaginal spotting recently.  No pelvic or vaginal pain.  No fever.  Frequent recurrent UTIs, last one about 3 weeks ago treated with Keflex.    OB History  Gravida Para Term Preterm AB Living  2 2 2     2   SAB TAB Ectopic Multiple Live Births               # Outcome Date GA Lbr Len/2nd Weight Sex Delivery Anes PTL Lv  2 Term           1 Term             Past medical history,surgical history, problem list, medications, allergies, family history and social history were all reviewed and documented in the EPIC chart.   Directed ROS with pertinent positives and negatives documented in the history of present illness/assessment and plan.  Exam:  Vitals:   04/13/20 1103  BP: 140/90   General appearance:  Normal  CVAT negative bilaterally  Abdomen: Normal  Gynecologic exam:  Vulva normal.  Complete uterine prolapse.  Vaginal erosions/ulcerations healing well.  Bimanual exam:  Normal uterus/no adnexal mass felt, NT.  U/A: Yellow cloudy, protein 1+, nitrites positive, white blood cells 40-60, red blood cells 10-20, bacteria many.  Urine culture pending.   Assessment/Plan:  84 y.o. G2P2002   1.  Postmenopausal bleeding Urine analysis showing blood.  Patient already has a pelvic ultrasound scheduled for Monday, April 16, 2020.  Will evaluate the endometrial lining at that time.  2. Acute recurrent cystitis Recurrent acute cystitis.  Will treat with Bactrim 1 tablet twice a day for 5 days.  Usage discussed and prescription sent to pharmacy.  Pending urine culture.  Recommend increasing water intake.  Patient instructed to push uterus back in the vagina to allow passage of urine.  3. Complete uterine prolapse Vaginal erosion and ulcerations healing.  Ordering Milex Ring #5 with support.  We will put the pessary in place as soon as it arrives.  4. Vaginal ulceration Healing well.  Will change pessary to a smaller size from #7 to #5 Milex ring with support.  Other orders - sulfamethoxazole-trimethoprim (BACTRIM) 400-80 MG tablet; Take 1 tablet by mouth 2 (two) times daily for 5 days.  [name] MD, 11:14 AM 04/13/2020

## 2020-04-15 LAB — URINALYSIS, COMPLETE W/RFL CULTURE
Bilirubin Urine: NEGATIVE
Glucose, UA: NEGATIVE
Hyaline Cast: NONE SEEN /LPF
Ketones, ur: NEGATIVE
Nitrites, Initial: POSITIVE — AB
Specific Gravity, Urine: 1.015 (ref 1.001–1.03)
pH: 6 (ref 5.0–8.0)

## 2020-04-15 LAB — CULTURE INDICATED

## 2020-04-15 LAB — URINE CULTURE
MICRO NUMBER:: 10852851
SPECIMEN QUALITY:: ADEQUATE

## 2020-04-16 ENCOUNTER — Other Ambulatory Visit: Payer: Self-pay

## 2020-04-16 ENCOUNTER — Ambulatory Visit (INDEPENDENT_AMBULATORY_CARE_PROVIDER_SITE_OTHER): Payer: PPO

## 2020-04-16 ENCOUNTER — Ambulatory Visit: Payer: PPO | Admitting: Obstetrics & Gynecology

## 2020-04-16 DIAGNOSIS — N813 Complete uterovaginal prolapse: Secondary | ICD-10-CM

## 2020-04-16 DIAGNOSIS — N854 Malposition of uterus: Secondary | ICD-10-CM | POA: Diagnosis not present

## 2020-04-16 DIAGNOSIS — N950 Postmenopausal bleeding: Secondary | ICD-10-CM | POA: Diagnosis not present

## 2020-04-16 DIAGNOSIS — N3000 Acute cystitis without hematuria: Secondary | ICD-10-CM

## 2020-04-16 NOTE — Progress Notes (Signed)
    Abigail [name] Sep 19, 1935 [id number]        84 y.o.  G2P2002   RP: PMB for Pelvic US  HPI: Presented for PMB with very light spotting on 04/12/2020.  At her visit on 04/13/2020 we noted:  Complete Uterine prolapse which was managed with a pessary, but the pessary was too large, a Milex ring #7, it was causing vaginal erosion/ulceration.  The pessary was therefore removed at last visit 03/23/2020. Patient had a recurrence of an Acute Cystitis treated with Bactrim.  Remained asymptomatic with no vaginal bleeding until yesterday when she saw a small amount of bleed and started experiencing pain with urination, frequency and difficulty passing urine.  No fever.    The urine culture on 04/13/2020 showed E. Coli sensitive to Bactrim which she took from 8/20th to 8/22nd.  No UTI Sxs anymore.  Awaiting pessary Milex ring #5 with support ordered on 04/13/2020.  Vaginal mucosa was healing well on 04/13/20.   OB History  Gravida Para Term Preterm AB Living  2 2 2     2   SAB TAB Ectopic Multiple Live Births               # Outcome Date GA Lbr Len/2nd Weight Sex Delivery Anes PTL Lv  2 Term           1 Term             Past medical history,surgical history, problem list, medications, allergies, family history and social history were all reviewed and documented in the EPIC chart.   Directed ROS with pertinent positives and negatives documented in the history of present illness/assessment and plan.  Exam:  There were no vitals filed for this visit. General appearance:  Normal  Pelvic US today: T/V images.  Anteverted uterus, mobile and atrophic measured at 5.76 x 4.18 x 2.87 cm.  The endometrial lining is symmetrical with a sliver of fluid, with no mass or thickening seen.  The endometrial lining is measured at 2.58 mm.  Both ovaries are normal in size.  No adnexal mass.  No free fluid in the posterior cul-de-sac.   Assessment/Plan:  84 y.o. G2P2002   1. Postmenopausal bleeding No current PMB.   Pelvic US reviewed thoroughly with patient.  Reassured that the Endometrial lining is thin at 2.58 mm.  No endometrial lesion.  Normal uterus and ovaries.  2. Acute recurrent cystitis Urine Culture E. Coli sensitive to Bactrim.  Treated.  No UTI Sxs anymore.  3. Complete uterine prolapse Awaiting new smaller Milex pessary ring #5 with support.  Vagina healing well, no longer having ulcerations, erosions healing per last visit exam.  Will f/u for insertion as soon as we receive it.  [name] MD, 1:05 PM 04/16/2020

## 2020-04-17 ENCOUNTER — Encounter: Payer: Self-pay | Admitting: Obstetrics & Gynecology

## 2020-04-18 ENCOUNTER — Other Ambulatory Visit: Payer: Self-pay

## 2020-04-18 ENCOUNTER — Encounter: Payer: Self-pay | Admitting: Obstetrics and Gynecology

## 2020-04-18 ENCOUNTER — Ambulatory Visit: Payer: PPO | Admitting: Obstetrics and Gynecology

## 2020-04-18 VITALS — BP 120/80

## 2020-04-18 DIAGNOSIS — N813 Complete uterovaginal prolapse: Secondary | ICD-10-CM | POA: Diagnosis not present

## 2020-04-18 DIAGNOSIS — Z4689 Encounter for fitting and adjustment of other specified devices: Secondary | ICD-10-CM

## 2020-04-18 NOTE — Progress Notes (Signed)
   JANEA SCHWENN Nov 11, 1935 [id number]  SUBJECTIVE:  84 y.o. G2P2002 female presents for pessary insertion.  She was fitted for a Milex ring pessary with support size 5 with Dr. Dellis Filbert on 04/13/2020 and she has been having symptomatic pelvic organ prolapse so she wanted the pessary placed as soon as possible.  Previously she used a larger pessary size 7 and this was causing vaginal erosion/ulceration.  No vaginal bleeding or discharge today.  No urinary symptoms.  Recently treated for UTI with Bactrim.  Allergies: Codeine and Norvasc [amlodipine]  No LMP recorded. Patient is postmenopausal.  Past medical history,surgical history, problem list, medications, allergies, family history and social history were all reviewed and documented as reviewed in the EPIC chart.   OBJECTIVE:  BP 120/80  The patient appears well, alert, oriented x 3, in no distress. PELVIC EXAM: VULVA: normal appearing vulva with no masses, tenderness or lesions, VAGINA: Complete uterine prolapse present with vaginal erosions healing well.  Pessary is inserted without difficulty.  (Milex ring pessary with support size 5) Tolerated the procedure well Chaperone: Caryn Bee present during the examination  ASSESSMENT:  84 y.o. [id number] here for pessary insertion  PLAN:  Discussed signs and symptoms to watch out for and to notify us if she is having any vaginal discomfort, pain, ongoing bleeding, discharge. RTC 3 to 4 months for pessary maintenance   Joseph Pierini MD 04/18/20

## 2020-04-19 ENCOUNTER — Ambulatory Visit (INDEPENDENT_AMBULATORY_CARE_PROVIDER_SITE_OTHER): Payer: PPO | Admitting: Obstetrics and Gynecology

## 2020-04-19 ENCOUNTER — Encounter: Payer: Self-pay | Admitting: Obstetrics and Gynecology

## 2020-04-19 ENCOUNTER — Encounter: Payer: Self-pay | Admitting: Gynecology

## 2020-04-19 VITALS — BP 124/80

## 2020-04-19 DIAGNOSIS — N813 Complete uterovaginal prolapse: Secondary | ICD-10-CM | POA: Diagnosis not present

## 2020-04-19 DIAGNOSIS — Z4689 Encounter for fitting and adjustment of other specified devices: Secondary | ICD-10-CM

## 2020-04-19 NOTE — Progress Notes (Signed)
   Abigail [name] 25-Dec-1935 [id number]  SUBJECTIVE:  84 y.o. G2P2002 female presents because her new pessary that was placed yesterday did spontaneously expel.  She recently had had a new pessary fitting for a size 5 ring pessary with support which we did place yesterday.  Shortly after the visit apparently it did fall out.  She does have events to attend today surrounding recent passing of a family member and she is uncomfortable with the pelvic pressure due to the pelvic organ prolapse.  She did bring her old size 7 pessary in as well.    Allergies: Codeine and Norvasc [amlodipine]  No LMP recorded. Patient is postmenopausal.  Past medical history,surgical history, problem list, medications, allergies, family history and social history were all reviewed and documented as reviewed in the EPIC chart.  OBJECTIVE:  BP 124/80  PELVIC EXAM: VULVA: normal appearing vulva with no masses, tenderness or lesions, VAGINA: Complete uterine prolapse present with vaginal erosions healing well.  Pessary is inserted without difficulty.  (Milex ring pessary with support size 7) The pessary appears slightly large for the vaginal cavity as the device is not quite fully extended in place, but it does fit and the patient is not experiencing any pain or discomfort after placement. She tolerated the procedure well Chaperone: Caryn Bee present during the examination  ASSESSMENT:  84 y.o. [phone] here for pessary insertion  PLAN:  We discussed that we unfortunately do not have any other signs of pessary in stock, but we can order her a size 6 ring pessary with support and have her return for placement when that arrives.  I also recommended that she have an appointment with Dr. Dellis Filbert for a separate pessary fitting to see if another shape of pessary would suit her better. We will order a size 6 ring pessary with support. As she has urgent matters to tend to today and she is not comfortable going about with the  current level of prolapse without the pessary in place, we decided to reinsert her size 7 ring pessary with support, although it did appear to be a bit large but fit okay and did suspend the prolapse.  She understands with ongoing use of the pessary that is too large she will have recurrence of vaginal erosions, so I told her to make sure she comes back early this next week for placement of another pessary.  She understands she will need emergent evaluation over the weekend by going to the ER if she is having any severe vaginal pain, and/or inability to void having this pessary in place.  She understands the risk of that and is comfortable with keeping the pessary in place for now.   Joseph Pierini MD 04/19/20

## 2020-04-24 ENCOUNTER — Other Ambulatory Visit: Payer: Self-pay

## 2020-04-24 ENCOUNTER — Ambulatory Visit: Payer: PPO | Admitting: Obstetrics and Gynecology

## 2020-04-24 ENCOUNTER — Ambulatory Visit: Payer: PPO | Admitting: Obstetrics & Gynecology

## 2020-04-24 ENCOUNTER — Encounter: Payer: Self-pay | Admitting: Obstetrics & Gynecology

## 2020-04-24 VITALS — BP 126/84

## 2020-04-24 DIAGNOSIS — Z4689 Encounter for fitting and adjustment of other specified devices: Secondary | ICD-10-CM

## 2020-04-24 DIAGNOSIS — N813 Complete uterovaginal prolapse: Secondary | ICD-10-CM

## 2020-04-24 NOTE — Progress Notes (Signed)
    Abigail [name] [birth year] [id number]        84 y.o.  G2P2002   RP: Insertion of pessary  HPI: Milex pessary ring #7 with support was too large and caused erosion/ulceration of the vaginal mucosa.  Milex ring #5 was too small and fell off with a cough.  Now trying Milex pessary ring #6 with support.  No PMB anymore.  No abnormal discharge.  No UTI Sx.   OB History  Gravida Para Term Preterm AB Living  2 2 2     2   SAB TAB Ectopic Multiple Live Births               # Outcome Date GA Lbr Len/2nd Weight Sex Delivery Anes PTL Lv  2 Term           1 Term             Past medical history,surgical history, problem list, medications, allergies, family history and social history were all reviewed and documented in the EPIC chart.   Directed ROS with pertinent positives and negatives documented in the history of present illness/assessment and plan.  Exam:  Vitals:   04/24/20 1407  BP: 126/84   General appearance:  Normal  Gynecologic exam:  Vulva normal.  Pessary #7 removed.  Vaginal mucosa intact.  Milex pessary ring #6 with support inserted without difficulty.  Good fit.   Assessment/Plan:  84 y.o. G2P2002   1. Fitting and adjustment of pessary Complete uterine prolapse.  Milex pessary ring #6 with support inserted easily, good fit.  Precautions reviewed.  Will f/u in 4 weeks to confirm good fit with no vaginal erosion/ulceration.  [name] MD, 2:11 PM 04/24/2020    y

## 2020-04-25 ENCOUNTER — Other Ambulatory Visit: Payer: Self-pay

## 2020-04-25 DIAGNOSIS — E039 Hypothyroidism, unspecified: Secondary | ICD-10-CM

## 2020-04-25 MED ORDER — LEVOTHYROXINE SODIUM 175 MCG PO TABS: ORAL_TABLET | ORAL | 1 refills | Status: DC

## 2020-05-07 ENCOUNTER — Telehealth: Payer: Self-pay | Admitting: Interventional Cardiology

## 2020-05-07 NOTE — Telephone Encounter (Signed)
New message:     Thursday patient was working in the yard and her legs felt very heavy. When patient woke up the next morning went to sleep or passed out patient is not sure.Rounda from Health team. Please call patient.

## 2020-05-07 NOTE — Telephone Encounter (Signed)
I spoke with patient. She reports nurse does phone call visit with her.  She does not have nurse who comes to the house. Patient reports her sister and brother in law died recently and she has been grieving. Working in the yard helps with this. She has been doing this without problems.  This past Thursday she was working in the yard hauling cans and bags of debris to the street. Her legs felt heavy that evening. She went out to dinner with a friend Thursday evening and had strange feeling. She is unable to describe feeling. Was not dizzy. No chest pain. No palpitations or feeling like heart beat was irregular. She slept on the couch Thursday night and Friday morning got up and sat in her recliner. A neighbor came to her door and the dogs started barking.  Patient woke up screaming.  She is not sure if she was sleeping or blacked out.  Thinks she was in the chair for 1-2 hours. She has not had any more of these episodes. On Saturday she rested on the couch most of the day.  Today she has weakness in her legs. No dizziness or chest pain.   A little shortness of breath which started this morning.  Shortness of breath is at rest and with exertion. Does not worsen with exertion.  Last BP was 162/67, heart rate 61.  She reports her BP fluctuates and yesterday's readings were 130/54, heart rate of 59. She recently had new pessary ring inserted and had some light bleeding in last 24 hours but this is not new.

## 2020-05-08 NOTE — Telephone Encounter (Signed)
For now, continue to stay hydrated by drinking plenty of water.  COntinue to monitor BP at home at least a few times/week.  OK to walk, but be careful to avoid falling and use support to help.    JV

## 2020-05-09 NOTE — Telephone Encounter (Signed)
Spoke with the pt and endorsed to her recommendations per Dr. Irish Lack.  Pt states she will increase her hydration and continue monitoring her BP a few times a week.  She states she will record these and call them in as needed.  Pt verbalized understanding and agrees with this plan.  Pt gracious for all the assistance provided.

## 2020-05-16 ENCOUNTER — Other Ambulatory Visit: Payer: Self-pay

## 2020-05-16 ENCOUNTER — Encounter: Payer: Self-pay | Admitting: Nurse Practitioner

## 2020-05-16 ENCOUNTER — Ambulatory Visit (INDEPENDENT_AMBULATORY_CARE_PROVIDER_SITE_OTHER): Payer: PPO | Admitting: Nurse Practitioner

## 2020-05-16 VITALS — BP 116/80 | HR 69 | Temp 97.6°F | Ht 61.4 in | Wt 152.4 lb

## 2020-05-16 DIAGNOSIS — E039 Hypothyroidism, unspecified: Secondary | ICD-10-CM

## 2020-05-16 DIAGNOSIS — E785 Hyperlipidemia, unspecified: Secondary | ICD-10-CM | POA: Diagnosis not present

## 2020-05-16 DIAGNOSIS — I10 Essential (primary) hypertension: Secondary | ICD-10-CM | POA: Diagnosis not present

## 2020-05-16 DIAGNOSIS — R7303 Prediabetes: Secondary | ICD-10-CM | POA: Diagnosis not present

## 2020-05-16 DIAGNOSIS — F4321 Adjustment disorder with depressed mood: Secondary | ICD-10-CM | POA: Diagnosis not present

## 2020-05-16 NOTE — Progress Notes (Signed)
I,Yamilka Roman [employer] as a Education administrator for [employer], FNP.,have documented all relevant documentation on the behalf of [name], FNP,as directed by  [name], FNP while in the presence of [name], [place]. This visit occurred during the SARS-CoV-2 public health emergency.  Safety protocols were in place, including screening questions prior to the visit, additional usage of staff PPE, and extensive cleaning of exam room while observing appropriate contact time as indicated for disinfecting solutions.  Subjective:     Patient ID: Abigail [name] , female    DOB: Apr 04, 1936 , 84 y.o.   MRN: [id number]   Chief Complaint  Patient presents with  . Hypertension  . Hypothyroidism    HPI  Here for routine follow up with her blood pressure and thyroid.  Her sister and her husband passed away in the last 4 weeks.  She has been struggling emotionally.  She is also dealing with her daughter who is being treated for Melanoma.  She felt like she saw her mother and father drive in her driveway and she has never had a sensation similar to that.  She is planning to go back to work next week which she feels help her throughout the day to help with fulfillment. She went for lif[facility] screening yesterday - she was told yesterday her blood pressure was [phone]'Y systolic.    Wt Readings from Last 3 Encounters: 05/16/20 : 152 lb 6.4 oz (69.1 kg) 03/23/20 : 147 lb (66.7 kg) 03/06/20 : 145 lb (65.8 kg)     Past Medical History:  Diagnosis Date  . Carotid artery occlusion   . Complication of anesthesia    hard to wake up per pt  . Diverticulitis   . Hypertension   . Hypothyroid   . Intrauterine pessary   . STEMI (ST elevation myocardial infarction) ([place]) 10/26/2019   DES RCA     Family History  Problem Relation Age of Onset  . Hypertension Mother      Current Outpatient Medications:  .  aspirin EC 81 MG tablet, Take 81 mg by mouth daily., Disp: , Rfl:  .  atorvastatin (LIPITOR) 80 MG  tablet, Take 1 tablet (80 mg total) by mouth daily at 6 PM., Disp: 90 tablet, Rfl: 3 .  Cranberry 1000 MG CAPS, Take 1 capsule by mouth daily at 12 noon., Disp: , Rfl:  .  docusate sodium (COLACE) 100 MG capsule, Take 200-300 mg by mouth daily as needed for mild constipation. , Disp: , Rfl:  .  levothyroxine (SYNTHROID) 175 MCG tablet, Take 1 tablet by mouth every am on Monday - Friday, Disp: 90 tablet, Rfl: 1 .  lisinopril-hydrochlorothiazide (ZESTORETIC) 20-25 MG tablet, Take 0.5 tablets by mouth 3 (three) times a week. Take on Monday Wednesday and Friday only, Disp: 90 tablet, Rfl: 1 .  metoprolol tartrate (LOPRESSOR) 25 MG tablet, Take 0.5 tablets (12.5 mg total) by mouth 2 (two) times daily., Disp: 180 tablet, Rfl: 3 .  nitroGLYCERIN (NITROSTAT) 0.4 MG SL tablet, Place 1 tablet (0.4 mg total) under the tongue every 5 (five) minutes x 3 doses as needed for chest pain., Disp: 25 tablet, Rfl: 3 .  oxybutynin (DITROPAN-XL) 10 MG 24 hr tablet, TAKE 1 TABLET BY MOUTH EVERYDAY AT BEDTIME, Disp: 90 tablet, Rfl: 1 .  ticagrelor (BRILINTA) 90 MG TABS tablet, Take 1 tablet (90 mg total) by mouth 2 (two) times daily., Disp: 180 tablet, Rfl: 3  Current Facility-Administered Medications:  .  Ring Pessary/Support MISC 1 each, 1  each, Does not apply, Once, Anyanwu, Sallyanne Havers, MD   Allergies  Allergen Reactions  . Codeine Nausea And Vomiting  . Norvasc [Amlodipine] Rash and Other (See Comments)    [place]ia     Review of Systems  Constitutional: Negative.   Respiratory: Negative.  Negative for cough.   Cardiovascular: Negative.  Negative for chest pain, palpitations and leg swelling.  Psychiatric/Behavioral: Negative.      Today's Vitals   05/16/20 0834  BP: 116/80  Pulse: 69  Temp: 97.6 F (36.4 C)  TempSrc: Oral  Weight: 152 lb 6.4 oz (69.1 kg)  Height: 5' 1.4" (1.56 m)  PainSc: 0-No pain   Body mass index is 28.42 kg/m.   Objective:  Physical Exam Vitals reviewed.  Constitutional:       General: She is not in acute distress.    Appearance: Normal appearance.  Cardiovascular:     Rate and Rhythm: Normal rate and regular rhythm.     Pulses: Normal pulses.     Heart sounds: Normal heart sounds. No murmur heard.   Pulmonary:     Effort: Pulmonary effort is normal. No respiratory distress.     Breath sounds: Normal breath sounds.  Neurological:     General: No focal deficit present.     Mental Status: She is alert and oriented to person, place, and time.     Cranial Nerves: No cranial nerve deficit.  Psychiatric:        Mood and Affect: Mood normal.        Behavior: Behavior normal.        Thought Content: Thought content normal.        Judgment: Judgment normal.         Assessment And Plan:     1. Essential hypertension . B/P is controlled.  . CMP ordered to check renal function.  . The importance of regular exercise and dietary modification was stressed to the patient.  - CMP14+EGFR  2. Acquired hypothyroidism  Chronic, controlled  Continue with current medications  Will check thyroid levels - TSH - T4 - T3, free  3. Prediabetes  Chronic, controlled  No current medications  Continue to limit intake of sugary foods and drinks - Hemoglobin A1c  4. Hyperlipidemia, unspecified hyperlipidemia type  Chronic, controlled  Continue with current medications, tolerating well.  - Lipid panel  5. Grief  Offered to refer for grief counseling after the loss of her sister and brother in law.  At this time she is planning to go back to work to help keep her busy.     Patient was given opportunity to ask questions. Patient verbalized understanding of the plan and was able to repeat key elements of the plan. All questions were answered to their satisfaction.   Teola Bradley, FNP, have reviewed all documentation for this visit. The documentation on 05/16/20 for the exam, diagnosis, procedures, and orders are all accurate and complete.   THE  PATIENT IS ENCOURAGED TO PRACTICE SOCIAL DISTANCING DUE TO THE COVID-19 PANDEMIC.

## 2020-05-16 NOTE — Patient Instructions (Signed)

## 2020-05-17 ENCOUNTER — Ambulatory Visit: Payer: Self-pay

## 2020-05-17 ENCOUNTER — Telehealth: Payer: PPO

## 2020-05-17 DIAGNOSIS — R7303 Prediabetes: Secondary | ICD-10-CM

## 2020-05-17 DIAGNOSIS — F4321 Adjustment disorder with depressed mood: Secondary | ICD-10-CM

## 2020-05-17 DIAGNOSIS — E039 Hypothyroidism, unspecified: Secondary | ICD-10-CM

## 2020-05-17 DIAGNOSIS — I10 Essential (primary) hypertension: Secondary | ICD-10-CM

## 2020-05-17 DIAGNOSIS — E785 Hyperlipidemia, unspecified: Secondary | ICD-10-CM

## 2020-05-17 LAB — LIPID PANEL
Chol/HDL Ratio: 2.8 ratio (ref 0.0–4.4)
Cholesterol, Total: 142 mg/dL (ref 100–199)
HDL: 51 mg/dL (ref >39)
LDL Chol Calc (NIH): 69 mg/dL (ref 0–99)
Triglycerides: 122 mg/dL (ref 0–149)
VLDL Cholesterol Cal: 22 mg/dL (ref 5–40)

## 2020-05-17 LAB — CMP14+EGFR
ALT: 19 IU/L (ref 0–32)
AST: 18 IU/L (ref 0–40)
Albumin/Globulin Ratio: 1.6 (ref 1.2–2.2)
Albumin: 4.4 g/dL (ref 3.6–4.6)
Alkaline Phosphatase: 115 IU/L (ref 44–121)
BUN/Creatinine Ratio: 16 (ref 12–28)
BUN: 20 mg/dL (ref 8–27)
Bilirubin Total: 0.3 mg/dL (ref 0.0–1.2)
CO2: 22 mmol/L (ref 20–29)
Calcium: 9.1 mg/dL (ref 8.7–10.3)
Chloride: 99 mmol/L (ref 96–106)
Creatinine, Ser: 1.22 mg/dL — ABNORMAL HIGH (ref 0.57–1.00)
GFR calc Af Amer: 48 mL/min/{1.73_m2} — ABNORMAL LOW (ref >59)
GFR calc non Af Amer: 41 mL/min/{1.73_m2} — ABNORMAL LOW (ref >59)
Globulin, Total: 2.8 g/dL (ref 1.5–4.5)
Glucose: 99 mg/dL (ref 65–99)
Potassium: 4.7 mmol/L (ref 3.5–5.2)
Sodium: 135 mmol/L (ref 134–144)
Total Protein: 7.2 g/dL (ref 6.0–8.5)

## 2020-05-17 LAB — HEMOGLOBIN A1C
Est. average glucose Bld gHb Est-mCnc: 123 mg/dL
Hgb A1c MFr Bld: 5.9 % — ABNORMAL HIGH (ref 4.8–5.6)

## 2020-05-17 LAB — T3, FREE: T3, Free: 2.2 pg/mL (ref 2.0–4.4)

## 2020-05-17 LAB — TSH: TSH: 12.6 u[IU]/mL — ABNORMAL HIGH (ref 0.450–4.500)

## 2020-05-17 LAB — T4: T4, Total: 6.5 ug/dL (ref 4.5–12.0)

## 2020-05-21 ENCOUNTER — Other Ambulatory Visit: Payer: Self-pay

## 2020-05-21 ENCOUNTER — Ambulatory Visit: Payer: PPO | Admitting: Obstetrics & Gynecology

## 2020-05-21 ENCOUNTER — Encounter: Payer: Self-pay | Admitting: Obstetrics & Gynecology

## 2020-05-21 VITALS — BP 118/80

## 2020-05-21 DIAGNOSIS — N813 Complete uterovaginal prolapse: Secondary | ICD-10-CM

## 2020-05-21 DIAGNOSIS — Z4689 Encounter for fitting and adjustment of other specified devices: Secondary | ICD-10-CM

## 2020-05-21 NOTE — Progress Notes (Signed)
    Abigail [name] [birth year] [id number]        84 y.o.  G2P2002   RP: Pessary maintenance  HPI: Well on Milex ring #6 with support.  Mild vaginal discharge, improved compared to discharge with larger pessary.  No vaginal pain.  Pessary did not fall off.  Very mild urinary incontinence only when gets ready to take her shower.  Able to garden.   OB History  Gravida Para Term Preterm AB Living  2 2 2     2   SAB TAB Ectopic Multiple Live Births               # Outcome Date GA Lbr Len/2nd Weight Sex Delivery Anes PTL Lv  2 Term           1 Term             Past medical history,surgical history, problem list, medications, allergies, family history and social history were all reviewed and documented in the EPIC chart.   Directed ROS with pertinent positives and negatives documented in the history of present illness/assessment and plan.  Exam:  Vitals:   05/21/20 1056  BP: 118/80   General appearance:  Normal  Gynecologic exam: Vulva normal.  Milex ring #6 with support removed easily and cleaned.  Mild vaginal d/c wnl.  Vaginal mucosa intact.  Pessary put back in place.   Assessment/Plan:  84 y.o. G2P2002   1. Pessary maintenance Good fit of Milex ring #6 with support.  No vaginal erosion or ulcers.  We will continue with same pessary.  Follow-up for pessary maintenance in 3 months.  [name] MD, 11:15 AM 05/21/2020

## 2020-05-21 NOTE — Patient Instructions (Signed)
Visit Information  Goals Addressed      Patient Stated   .  "I may consider grievance counseling" (pt-stated)        [place] (see longitudinal plan of care for additional care plan information)  Current Barriers:  [name] Kitchen Knowledge Deficits related to how to obtain grievance counseling for recent loss of family members  . Chronic Disease Management support and education needs related to Acquired hypothyroidism, Anxiety, Essential hypertension, Prediabetes, Hyperlipidemia . Recent loss of sister and brother in law  Nurse Case Manager Clinical Goal(s):  [name] Kitchen Over the next 30 days, patient will verbalize understanding of plan for consideration of grievance counseling for recent loss of sister and brother in law  CCM RN CM Interventions:  05/17/20 call completed with patient  . Inter-disciplinary care team collaboration (see longitudinal plan of care) . Evaluation of current treatment plan related to grief counseling and patient's adherence to plan as established by provider . Determined patient recently lost her sister and her brother in law; determined their deaths are not COVID related . Provided active listening to patient and emphasized  my condolences for her loss  . Determined patient is grieving over this loss but declines to receive grievance counseling at this time; Determined patient plans to resume working part time in order to keep herself busy and use distraction to help her grieve . Discussed the embedded BSW can assist with coordinating grievance counseling if patient is interested, determined she will consider this in the future if needed   . Discussed plans with patient for ongoing care management follow up and provided patient with direct contact information for care management team  Patient Self Care Activities:  . Self administers medications as prescribed . Attends all scheduled provider appointments . Calls pharmacy for medication refills . Calls provider office for  new concerns or questions  Initial goal documentation       Patient verbalizes understanding of instructions provided today.   Telephone follow up appointment with care management team member scheduled for: 06/06/20  Barb Merino, RN, BSN, CCM Care Management Coordinator [place] [facility]  Direct Phone: [phone]

## 2020-05-21 NOTE — Chronic Care Management (AMB) (Signed)
Chronic Care Management   Follow Up Note   05/17/2020 Name: Abigail [name] MRN: [id number] DOB: 05-15-1936  Referred by: [name], FNP Reason for referral : No chief complaint on file.   Abigail [name] is a 84 y.o. year old female who is a primary care patient of [name], [place]. The CCM team was consulted for assistance with chronic disease management and care coordination needs.    Review of patient status, including review of consultants reports, relevant laboratory and other test results, and collaboration with appropriate care team members and the patient's provider was performed as part of comprehensive patient evaluation and provision of chronic care management services.    SDOH (Social Determinants of Health) assessments performed: No See Care Plan activities for detailed interventions related to [place])   Placed outbound CCM RN CM call to patient for a care plan update.     Outpatient Encounter Medications as of 05/17/2020  Medication Sig  . aspirin EC 81 MG tablet Take 81 mg by mouth daily.  [name] Kitchen atorvastatin (LIPITOR) 80 MG tablet Take 1 tablet (80 mg total) by mouth daily at 6 PM.  . Cranberry 1000 MG CAPS Take 1 capsule by mouth daily at 12 noon.  . docusate sodium (COLACE) 100 MG capsule Take 200-300 mg by mouth daily as needed for mild constipation.   [name] Kitchen levothyroxine (SYNTHROID) 175 MCG tablet Take 1 tablet by mouth every am on Monday - Friday  . lisinopril-hydrochlorothiazide (ZESTORETIC) 20-25 MG tablet Take 0.5 tablets by mouth 3 (three) times a week. Take on Monday Wednesday and Friday only  . metoprolol tartrate (LOPRESSOR) 25 MG tablet Take 0.5 tablets (12.5 mg total) by mouth 2 (two) times daily.  . nitroGLYCERIN (NITROSTAT) 0.4 MG SL tablet Place 1 tablet (0.4 mg total) under the tongue every 5 (five) minutes x 3 doses as needed for chest pain.  [name] Kitchen oxybutynin (DITROPAN-XL) 10 MG 24 hr tablet TAKE 1 TABLET BY MOUTH EVERYDAY AT BEDTIME  . ticagrelor (BRILINTA) 90 MG  TABS tablet Take 1 tablet (90 mg total) by mouth 2 (two) times daily.   Facility-Administered Encounter Medications as of 05/17/2020  Medication  . Ring Pessary/Support MISC 1 each     Objective:  Lab Results  Component Value Date   HGBA1C 5.9 (H) 05/16/2020   HGBA1C 5.8 (H) 10/26/2019   HGBA1C 5.8 (H) 06/30/2019   Lab Results  Component Value Date   MICROALBUR 80 01/11/2020   LDLCALC 69 05/16/2020   CREATININE 1.22 (H) 05/16/2020   BP Readings from Last 3 Encounters:  05/21/20 118/80  05/16/20 116/80  04/24/20 126/84   Goals Addressed      Patient Stated   .  "I may consider grievance counseling" (pt-stated)        [place] (see longitudinal plan of care for additional care plan information)  Current Barriers:  [name] Kitchen Knowledge Deficits related to how to obtain grievance counseling for recent loss of family members  . Chronic Disease Management support and education needs related to Acquired hypothyroidism, Anxiety, Essential hypertension, Prediabetes, Hyperlipidemia . Recent loss of sister and brother in law  Nurse Case Manager Clinical Goal(s):  [name] Kitchen Over the next 30 days, patient will verbalize understanding of plan for consideration of grievance counseling for recent loss of sister and brother in law  CCM RN CM Interventions:  05/17/20 call completed with patient  . Inter-disciplinary care team collaboration (see longitudinal plan of care) . Evaluation of current treatment plan related to grief  counseling and patient's adherence to plan as established by provider . Determined patient recently lost her sister and her brother in law; determined their deaths are not COVID related . Provided active listening to patient and emphasized  my condolences for her loss  . Determined patient is grieving over this loss but declines to receive grievance counseling at this time; Determined patient plans to resume working part time in order to keep herself busy and use distraction to  help her grieve . Discussed the embedded BSW can assist with coordinating grievance counseling if patient is interested, determined she will consider this in the future if needed   . Discussed plans with patient for ongoing care management follow up and provided patient with direct contact information for care management team  Patient Self Care Activities:  . Self administers medications as prescribed . Attends all scheduled provider appointments . Calls pharmacy for medication refills . Calls provider office for new concerns or questions  Initial goal documentation       Plan:   Telephone follow up appointment with care management team member scheduled for: 06/06/20   Barb Merino, RN, BSN, CCM Care Management Coordinator [place] [facility]  Direct Phone: [phone]

## 2020-06-06 ENCOUNTER — Telehealth: Payer: PPO

## 2020-06-14 ENCOUNTER — Other Ambulatory Visit: Payer: Self-pay | Admitting: Nurse Practitioner

## 2020-06-14 ENCOUNTER — Other Ambulatory Visit: Payer: Self-pay

## 2020-06-14 ENCOUNTER — Other Ambulatory Visit: Payer: PPO

## 2020-06-14 DIAGNOSIS — E039 Hypothyroidism, unspecified: Secondary | ICD-10-CM

## 2020-06-15 LAB — T3, FREE: T3, Free: 2.4 pg/mL (ref 2.0–4.4)

## 2020-06-15 LAB — TSH: TSH: 3.4 u[IU]/mL (ref 0.450–4.500)

## 2020-06-15 LAB — T4: T4, Total: 8.4 ug/dL (ref 4.5–12.0)

## 2020-07-04 ENCOUNTER — Telehealth: Payer: PPO

## 2020-07-04 ENCOUNTER — Ambulatory Visit: Payer: Self-pay

## 2020-07-04 ENCOUNTER — Other Ambulatory Visit: Payer: Self-pay

## 2020-07-04 DIAGNOSIS — R7303 Prediabetes: Secondary | ICD-10-CM

## 2020-07-04 DIAGNOSIS — I10 Essential (primary) hypertension: Secondary | ICD-10-CM

## 2020-07-04 DIAGNOSIS — E039 Hypothyroidism, unspecified: Secondary | ICD-10-CM

## 2020-07-04 DIAGNOSIS — E785 Hyperlipidemia, unspecified: Secondary | ICD-10-CM

## 2020-07-05 NOTE — Chronic Care Management (AMB) (Signed)
Chronic Care Management   Follow Up Note   07/04/2020 Name: Abigail [name] MRN: [id number] DOB: [birth year]  Referred by: [name], FNP Reason for referral : Chronic Care Management (CCM RNCM FU Call )   Abigail [name] is a 84 y.o. year old female who is a primary care patient of [name], [place]. The CCM team was consulted for assistance with chronic disease management and care coordination needs.    Review of patient status, including review of consultants reports, relevant laboratory and other test results, and collaboration with appropriate care team members and the patient's provider was performed as part of comprehensive patient evaluation and provision of chronic care management services.    SDOH (Social Determinants of Health) assessments performed: Yes See Care Plan activities for detailed interventions related to [place])   Completed CCM RN CM follow up call with patient for a care plan update.     Outpatient Encounter Medications as of 07/04/2020  Medication Sig  . aspirin EC 81 MG tablet Take 81 mg by mouth daily.  [name] Kitchen atorvastatin (LIPITOR) 80 MG tablet Take 1 tablet (80 mg total) by mouth daily at 6 PM.  . Cranberry 1000 MG CAPS Take 1 capsule by mouth daily at 12 noon.  . docusate sodium (COLACE) 100 MG capsule Take 200-300 mg by mouth daily as needed for mild constipation.   [name] Kitchen levothyroxine (SYNTHROID) 175 MCG tablet Take 1 tablet by mouth every am on Monday - Friday  . lisinopril-hydrochlorothiazide (ZESTORETIC) 20-25 MG tablet Take 0.5 tablets by mouth 3 (three) times a week. Take on Monday Wednesday and Friday only  . metoprolol tartrate (LOPRESSOR) 25 MG tablet Take 0.5 tablets (12.5 mg total) by mouth 2 (two) times daily.  . nitroGLYCERIN (NITROSTAT) 0.4 MG SL tablet Place 1 tablet (0.4 mg total) under the tongue every 5 (five) minutes x 3 doses as needed for chest pain.  [name] Kitchen oxybutynin (DITROPAN-XL) 10 MG 24 hr tablet TAKE 1 TABLET BY MOUTH EVERYDAY AT BEDTIME  .  ticagrelor (BRILINTA) 90 MG TABS tablet Take 1 tablet (90 mg total) by mouth 2 (two) times daily.   Facility-Administered Encounter Medications as of 07/04/2020  Medication  . Ring Pessary/Support MISC 1 each     Objective:  Lab Results  Component Value Date   HGBA1C 5.9 (H) 05/16/2020   HGBA1C 5.8 (H) 10/26/2019   HGBA1C 5.8 (H) 06/30/2019   Lab Results  Component Value Date   MICROALBUR 80 01/11/2020   LDLCALC 69 05/16/2020   CREATININE 1.22 (H) 05/16/2020   BP Readings from Last 3 Encounters:  05/21/20 118/80  05/16/20 116/80  04/24/20 126/84    Goals Addressed      Patient Stated   .  "I may consider grievance counseling" (pt-stated)        [place] (see longitudinal plan of care for additional care plan information)  Current Barriers:  [name] Kitchen Knowledge Deficits related to how to obtain grievance counseling for recent loss of family members  . Chronic Disease Management support and education needs related to Acquired hypothyroidism, Anxiety, Essential hypertension, Prediabetes, Hyperlipidemia . Recent loss of sister and brother in law  Nurse Case Manager Clinical Goal(s):  . New 07/04/20 Over the next 90 days, patient will verbalize understanding of plan for consideration of grievance counseling for recent loss of sister and brother in law  CCM RN CM Interventions:  07/04/20 call completed with patient  . Inter-disciplinary care team collaboration (see longitudinal plan of care) . Evaluation  of current treatment plan related to grief counseling and patient's adherence to plan as established by provider . Determined patient recently lost her sister and her brother in law and in addition her nephew's wife who was very young has also passed away, all losses have occurred in the past 2 months . Provided active listening to patient and emphasized  my condolences for her loss  . Determined patient is grieving over this loss but declines to receive grievance counseling  at this time; Determined patient plans to resume working part time in order to keep herself busy and use distraction to help her grieve . Discussed CCM team and or PCP can assist with coordinating grievance counseling if patient is interested, determined she will consider this in the future if needed   . Discussed plans with patient for ongoing care management follow up and provided patient with direct contact information for care management team  Patient Self Care Activities:  . Self administers medications as prescribed . Attends all scheduled provider appointments . Calls pharmacy for medication refills . Calls provider office for new concerns or questions  Please see past updates related to this goal by clicking on the "Past Updates" button in the selected goal      .  "I went to Urgent Care for a UTI" (pt-stated)        [place] (see longitudinal plan of care for additional care plan information)  Current Barriers:  [name] Kitchen Knowledge Deficits related to ways to improve and maintain Urinary Tract Health  . Chronic Disease Management support and education needs related to Acquired hypothyroidism, Anxiety, Essential hypertension, Prediabetes, Hyperlipidemia  Nurse Case Manager Clinical Goal(s):  [name] Kitchen Over the next 90 days, patient will work with the CCM team and PCP to address needs related to diagnosis and treatment for Cystitis and recurrent UTI  CCM RN CM Interventions:  07/04/20 call completed with patient  . Inter-disciplinary care team collaboration (see longitudinal plan of care) . Evaluation of current treatment plan related to Cystitis and recurrent UTI and patient's adherence to plan as established by provider . Determined patient completed an Urgent Care visit on 02/18/20 for s/s of UTI: Reviewed and discussed the following Assessment/Plan:  o Patient presents with vaginal irritation which she attributed to increased exercise on a treadmill.  She however started to have some  dysuria and some general malaise over the past 2 days.She is clinically well in appearance.  She is afebrile with stable vital signs.  No flank pain or abdominal pain. Urinalysis is grossly positive consistent with UTI.  Will start on twice daily Keflex. Return precautions reviewed. o Final Clinical Impression(s) / ED Diagnoses o Final diagnoses: o  Acute cystitis without hematuria   o Rx / DC Orders o  o  o  ED Discharge Orders  o    o  o  o  o  o  o    o   o   o Ordered o  o    o   o cephALEXin (KEFLEX) 500 MG capsule  2 times daily o   o  o  o     . Reviewed and discussed abnormal urine culture indicates Escherichia Coli; Educated on importance of completing full course of antibiotics . Recommended patient drop off a urine sample at PCP office to retest urine approximately 2 weeks after completing her antibiotic . Provided education to patient re: ways to improve Self Health management to maintain good Urinary health; Educated on early  s/s suggestive of UTI and discussed importance of reporting symptoms promptly in order to seek early treatment . Discussed plans with patient for ongoing care management follow up and provided patient with direct contact information for care management team 07/04/20 call completed with patient  . Assessed for reoccurring signs/symptoms suggestive of UTI . Determined patient is experiencing some abnormal bleeding with urination, she denies having any other symptoms . Determined she did f/u with her GYN and a new pessary was placed with some improvement in her symptoms  . Re-educated patient on sign/symptoms of UTI and the importance to seek medical care for this condition promptly to help avoid hospitalization and or severe illness due to sepsis . Encouraged patient to contact the [place] office to scheduled a f/u visit and allow for urine check to diagnose UTI and treat promptly . Determined patient will consider a PCP OV but plans to have her Cardiologist  check her urine during scheduled OV for Monday, 07/09/20 . Discussed plans with patient for ongoing care management follow up and provided patient with direct contact information for care management team  Patient Self Care Activities:  . Patient verbalizes understanding of plan to have urine rechecked for clearance of UTI approximately 2 weeks after completing her full course of antibiotics  . Self administers medications as prescribed . Attends all scheduled provider appointments . Calls pharmacy for medication refills . Performs ADL's independently . Performs IADL's independently . Calls provider office for new concerns or questions  Please see past updates related to this goal by clicking on the "Past Updates" button in the selected goal         Plan:   Telephone follow up appointment with care management team member scheduled for: 08/29/20  Barb Merino, RN, BSN, CCM Care Management Coordinator [place] [facility]  Direct Phone: [phone]

## 2020-07-05 NOTE — Patient Instructions (Signed)
Visit Information  Goals Addressed      Patient Stated   .  "I may consider grievance counseling" (pt-stated)        [place] (see longitudinal plan of care for additional care plan information)  Current Barriers:  [name] Kitchen Knowledge Deficits related to how to obtain grievance counseling for recent loss of family members  . Chronic Disease Management support and education needs related to Acquired hypothyroidism, Anxiety, Essential hypertension, Prediabetes, Hyperlipidemia . Recent loss of sister and brother in law  Nurse Case Manager Clinical Goal(s):  . New 07/04/20 Over the next 90 days, patient will verbalize understanding of plan for consideration of grievance counseling for recent loss of sister and brother in law  CCM RN CM Interventions:  07/04/20 call completed with patient  . Inter-disciplinary care team collaboration (see longitudinal plan of care) . Evaluation of current treatment plan related to grief counseling and patient's adherence to plan as established by provider . Determined patient recently lost her sister and her brother in law and in addition her nephew's wife who was very young has also passed away, all losses have occurred in the past 2 months . Provided active listening to patient and emphasized  my condolences for her loss  . Determined patient is grieving over this loss but declines to receive grievance counseling at this time; Determined patient plans to resume working part time in order to keep herself busy and use distraction to help her grieve . Discussed CCM team and or PCP can assist with coordinating grievance counseling if patient is interested, determined she will consider this in the future if needed   . Discussed plans with patient for ongoing care management follow up and provided patient with direct contact information for care management team  Patient Self Care Activities:  . Self administers medications as prescribed . Attends all scheduled  provider appointments . Calls pharmacy for medication refills . Calls provider office for new concerns or questions  Please see past updates related to this goal by clicking on the "Past Updates" button in the selected goal      .  "I went to Urgent Care for a UTI" (pt-stated)        [place] (see longitudinal plan of care for additional care plan information)  Current Barriers:  [name] Kitchen Knowledge Deficits related to ways to improve and maintain Urinary Tract Health  . Chronic Disease Management support and education needs related to Acquired hypothyroidism, Anxiety, Essential hypertension, Prediabetes, Hyperlipidemia  Nurse Case Manager Clinical Goal(s):  [name] Kitchen Over the next 90 days, patient will work with the CCM team and PCP to address needs related to diagnosis and treatment for Cystitis and recurrent UTI  CCM RN CM Interventions:  07/04/20 call completed with patient  . Inter-disciplinary care team collaboration (see longitudinal plan of care) . Evaluation of current treatment plan related to Cystitis and recurrent UTI and patient's adherence to plan as established by provider . Determined patient completed an Urgent Care visit on 02/18/20 for s/s of UTI: Reviewed and discussed the following Assessment/Plan:  o Patient presents with vaginal irritation which she attributed to increased exercise on a treadmill.  She however started to have some dysuria and some general malaise over the past 2 days.She is clinically well in appearance.  She is afebrile with stable vital signs.  No flank pain or abdominal pain. Urinalysis is grossly positive consistent with UTI.  Will start on twice daily Keflex. Return precautions reviewed. o Final Clinical  Impression(s) / ED Diagnoses o Final diagnoses: o  Acute cystitis without hematuria   o Rx / DC Orders o  o  o  ED Discharge Orders  o    o  o  o  o  o  o    o   o   o Ordered o  o    o   o cephALEXin (KEFLEX) 500 MG capsule  2 times daily o   o   o  o     . Reviewed and discussed abnormal urine culture indicates Escherichia Coli; Educated on importance of completing full course of antibiotics . Recommended patient drop off a urine sample at PCP office to retest urine approximately 2 weeks after completing her antibiotic . Provided education to patient re: ways to improve Self Health management to maintain good Urinary health; Educated on early s/s suggestive of UTI and discussed importance of reporting symptoms promptly in order to seek early treatment . Discussed plans with patient for ongoing care management follow up and provided patient with direct contact information for care management team 07/04/20 call completed with patient  . Assessed for reoccurring signs/symptoms suggestive of UTI . Determined patient is experiencing some abnormal bleeding with urination, she denies having any other symptoms . Determined she did f/u with her GYN and a new pessary was placed with some improvement in her symptoms  . Re-educated patient on sign/symptoms of UTI and the importance to seek medical care for this condition promptly to help avoid hospitalization and or severe illness due to sepsis . Encouraged patient to contact the [place] office to scheduled a f/u visit and allow for urine check to diagnose UTI and treat promptly . Determined patient will consider a PCP OV but plans to have her Cardiologist check her urine during scheduled OV for Monday, 07/09/20 . Discussed plans with patient for ongoing care management follow up and provided patient with direct contact information for care management team  Patient Self Care Activities:  . Patient verbalizes understanding of plan to have urine rechecked for clearance of UTI approximately 2 weeks after completing her full course of antibiotics  . Self administers medications as prescribed . Attends all scheduled provider appointments . Calls pharmacy for medication refills . Performs ADL's  independently . Performs IADL's independently . Calls provider office for new concerns or questions  Please see past updates related to this goal by clicking on the "Past Updates" button in the selected goal        Patient verbalizes understanding of instructions provided today.   Telephone follow up appointment with care management team member scheduled for: 08/29/20  Barb Merino, RN, BSN, CCM Care Management Coordinator [place] [facility]  Direct Phone: [phone]

## 2020-07-07 NOTE — Progress Notes (Signed)
Cardiology Office Note   Date:  07/09/2020   ID:  Abigail [name], DOB 29-Mar-1936, MRN [id number]  PCP:  [name], FNP    No chief complaint on file.  CAD  Wt Readings from Last 3 Encounters:  07/09/20 160 lb (72.6 kg)  05/16/20 152 lb 6.4 oz (69.1 kg)  03/23/20 147 lb (66.7 kg)       History of Present Illness: Abigail [name] is a 84 y.o. female   who has had presyncope, hyponatremia in the past.  I saw her in 10/2018 for these issues.   In March 2021, she presented with an ST elevation MI.  Cath report showed:  "A drug-eluting stent was successfully placed using a SYNERGY XD 2.75X24.  Post intervention, there is a 0% residual stenosis.  1. Severe single-vessel coronary artery disease with a proximal RCA culprit lesion, treated successfully with primary PCI using a 2.75 x 24 mm Synergy DES 2. Mild diffuse nonobstructive disease involving the LAD and left circumflex without any significant stenoses 3. Normal LVEDP 4. Moderately severe diffuse residual stenosis in the mid and distal RCA, favor initial medical therapy."  Echo 2021: "Left ventricular ejection fraction, by estimation, is 60 to 65%. The  left ventricle has normal function. The left ventricle has no regional  wall motion abnormalities. Left ventricular diastolic parameters were  normal.  2. Right ventricular systolic function is normal. The right ventricular  size is normal.  3. Left atrial size was mildly dilated.  4. The mitral valve is normal in structure and function. No evidence of  mitral valve regurgitation. No evidence of mitral stenosis.  5. The aortic valve is normal in structure and function. Aortic valve  regurgitation is not visualized. No aortic stenosis is present. "  Since the last visit, she has stayed out of crowds.  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  She had 2 shots and will have her booster soon.   She is due her PNA shot as well.   Denies : Chest pain. Dizziness. Leg  edema. Orthopnea. Palpitations. Paroxysmal nocturnal dyspnea. Shortness of breath. Syncope.   She has increased her hydration.  One episode of NTG use.  Sx resolved with resting as well.   She also has some leg fatigue as well at the end of the day.    Walking was limited due to GYN issues.       Past Medical History:  Diagnosis Date  . Carotid artery occlusion   . Complication of anesthesia    hard to wake up per pt  . Diverticulitis   . Hypertension   . Hypothyroid   . Intrauterine pessary   . STEMI (ST elevation myocardial infarction) ([place]) 10/26/2019   DES RCA    Past Surgical History:  Procedure Laterality Date  . BACK SURGERY  1980  . CORONARY/GRAFT ACUTE MI REVASCULARIZATION N/A 10/26/2019   Procedure: Coronary/Graft Acute MI Revascularization;  Surgeon: Sherren Mocha, MD;  Location: [facility];  Service: Cardiovascular;  Laterality: N/A;  . ELBOW SURGERY     left  . FRACTURE SURGERY    . LEFT HEART CATH AND CORONARY ANGIOGRAPHY N/A 10/26/2019   Procedure: LEFT HEART CATH AND CORONARY ANGIOGRAPHY;  Surgeon: Sherren Mocha, MD;  Location: [facility];  Service: Cardiovascular;  Laterality: N/A;  . SPINE SURGERY       Current Outpatient Medications  Medication Sig Dispense Refill  . aspirin EC 81 MG tablet Take 81 mg by mouth  daily.    . atorvastatin (LIPITOR) 80 MG tablet Take 1 tablet (80 mg total) by mouth daily at 6 PM. 90 tablet 3  . Cranberry 1000 MG CAPS Take 1 capsule by mouth daily at 12 noon.    . docusate sodium (COLACE) 100 MG capsule Take 200-300 mg by mouth daily as needed for mild constipation.     [name] Kitchen levothyroxine (SYNTHROID) 175 MCG tablet Take 1 tablet by mouth every am on Monday - Friday 90 tablet 1  . lisinopril-hydrochlorothiazide (ZESTORETIC) 20-25 MG tablet Take 0.5 tablets by mouth 3 (three) times a week. Take on Monday Wednesday and Friday only 90 tablet 1  . metoprolol tartrate (LOPRESSOR) 25 MG tablet Take 0.5 tablets (12.5  mg total) by mouth 2 (two) times daily. 180 tablet 3  . nitroGLYCERIN (NITROSTAT) 0.4 MG SL tablet Place 1 tablet (0.4 mg total) under the tongue every 5 (five) minutes x 3 doses as needed for chest pain. 25 tablet 3  . oxybutynin (DITROPAN-XL) 10 MG 24 hr tablet TAKE 1 TABLET BY MOUTH EVERYDAY AT BEDTIME 90 tablet 1  . ticagrelor (BRILINTA) 90 MG TABS tablet Take 1 tablet (90 mg total) by mouth 2 (two) times daily. 180 tablet 3   Current Facility-Administered Medications  Medication Dose Route Frequency Provider Last Rate Last Admin  . Ring Pessary/Support MISC 1 each  1 each Does not apply Once Anyanwu, Ugonna A, MD        Allergies:   Codeine and Norvasc [amlodipine]    Social History:  The patient  reports that she quit smoking about 34 years ago. She has never used smokeless tobacco. She reports that she does not drink alcohol and does not use drugs.   Family History:  The patient's family history includes Hypertension in her mother.    ROS:  Please see the history of present illness.   Otherwise, review of systems are positive for leg fatigue at end of day.   All other systems are reviewed and negative.    PHYSICAL EXAM: VS:  BP (!) 154/82   Pulse 73   Ht 5\' 1"  (1.549 m)   Wt 160 lb (72.6 kg)   SpO2 96%   BMI 30.23 kg/m  , BMI Body mass index is 30.23 kg/m. GEN: Well nourished, well developed, in no acute distress  HEENT: normal  Neck: no JVD, carotid bruits, or masses Cardiac: RRR; no murmurs, rubs, or gallops,no edema  Respiratory:  clear to auscultation bilaterally, normal work of breathing GI: soft, nontender, nondistended, + BS MS: no deformity or atrophy  Skin: warm and dry, no rash Neuro:  Strength and sensation are intact Psych: euthymic mood, full affect   EKG:   The ekg ordered 4/21 demonstrates NSR, RBBB   Recent Labs: 12/01/2019: B Natriuretic Peptide 145.3 01/11/2020: Hemoglobin 10.2; Platelets 336 05/16/2020: ALT 19; BUN 20; Creatinine, Ser 1.22;  Potassium 4.7; Sodium 135 06/14/2020: TSH 3.400   Lipid Panel    Component Value Date/Time   CHOL 142 05/16/2020 0913   TRIG 122 05/16/2020 0913   HDL 51 05/16/2020 0913   CHOLHDL 2.8 05/16/2020 0913   CHOLHDL 4.8 10/26/2019 2246   VLDL 40 10/26/2019 2246   LDLCALC 69 05/16/2020 0913     Other studies Reviewed: Additional studies/ records that were reviewed today with results demonstrating: labs reviewed.   ASSESSMENT AND PLAN:  1.   CAD/Old MI:  RCA stent.  Residual distal and mid RCA disease.  One episode of chest pain  since 3/21.  We discussed repeat cath but since sx are well controlled, decided to hold off. Could also consider Imdur as well if she has more chest pain. 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  No CP with these activities.  Try to resume regular walking, 20 minutes per day working her way up. 2.   Carotid artery disease: 20-30% based on CTA in 2019.   3. HTN: BP at home is often in the 130s.  All readings below 768 systolic. Diastolics well controlled. Off of metoprolol. 4. Hyperlipidemia: LDL 69 in 9/21 5. She requested that a urine sample be checked for UTI.  6. She will get COVID booster. She is considering travel to [place].  She will get help to travel within airport.     Current medicines are reviewed at length with the patient today.  The patient concerns regarding her medicines were addressed.  The following changes have been made:  No change  Labs/ tests ordered today include:  No orders of the defined types were placed in this encounter.   Recommend 150 minutes/week of aerobic exercise Low fat, low carb, high fiber diet recommended  Disposition:   FU in 6 months   Signed, Larae Grooms, MD  07/09/2020 9:05 AM    [employer] [place], [place], [place]  [address] Phone: [phone]; Fax: [phone]

## 2020-07-09 ENCOUNTER — Encounter: Payer: Self-pay | Admitting: Interventional Cardiology

## 2020-07-09 ENCOUNTER — Encounter: Payer: Self-pay | Admitting: Nurse Practitioner

## 2020-07-09 ENCOUNTER — Ambulatory Visit (INDEPENDENT_AMBULATORY_CARE_PROVIDER_SITE_OTHER): Payer: PPO | Admitting: Nurse Practitioner

## 2020-07-09 ENCOUNTER — Other Ambulatory Visit: Payer: Self-pay

## 2020-07-09 ENCOUNTER — Ambulatory Visit: Payer: PPO | Admitting: Interventional Cardiology

## 2020-07-09 VITALS — BP 154/82 | HR 73 | Ht 61.0 in | Wt 160.0 lb

## 2020-07-09 VITALS — BP 130/78 | HR 70 | Temp 98.0°F | Ht 61.0 in | Wt 161.2 lb

## 2020-07-09 DIAGNOSIS — I10 Essential (primary) hypertension: Secondary | ICD-10-CM

## 2020-07-09 DIAGNOSIS — I25118 Atherosclerotic heart disease of native coronary artery with other forms of angina pectoris: Secondary | ICD-10-CM | POA: Diagnosis not present

## 2020-07-09 DIAGNOSIS — R319 Hematuria, unspecified: Secondary | ICD-10-CM

## 2020-07-09 DIAGNOSIS — I6523 Occlusion and stenosis of bilateral carotid arteries: Secondary | ICD-10-CM | POA: Diagnosis not present

## 2020-07-09 DIAGNOSIS — E782 Mixed hyperlipidemia: Secondary | ICD-10-CM | POA: Diagnosis not present

## 2020-07-09 LAB — POCT URINALYSIS DIPSTICK
Bilirubin, UA: NEGATIVE
Glucose, UA: NEGATIVE
Ketones, UA: NEGATIVE
Nitrite, UA: NEGATIVE
Protein, UA: NEGATIVE
Spec Grav, UA: 1.015 (ref 1.010–1.025)
Urobilinogen, UA: 0.2 E.U./dL
pH, UA: 6 (ref 5.0–8.0)

## 2020-07-09 NOTE — Progress Notes (Signed)
This visit occurred during the SARS-CoV-2 public health emergency.  Safety protocols were in place, including screening questions prior to the visit, additional usage of staff PPE, and extensive cleaning of exam room while observing appropriate contact time as indicated for disinfecting solutions.  Subjective:     Patient ID: Abigail [name] , female    DOB: 10-19-1935 , 84 y.o.   MRN: [id number]   Chief Complaint  Patient presents with  . Hematuria    HPI  Patient is here today because she has noticed blood in her urine. She said that it started about a week ago. She reports last week she thought she had some infection.  She reports she is passing blood unsure if urinary.   She has had her pessary changed recently, she feels like could be causing her discomfort - was unable to get placed exactly how would like. She has appt with gyn Dec 22nd.      Past Medical History:  Diagnosis Date  . Carotid artery occlusion   . Complication of anesthesia    hard to wake up per pt  . Diverticulitis   . Hypertension   . Hypothyroid   . Intrauterine pessary   . STEMI (ST elevation myocardial infarction) ([place]) 10/26/2019   DES RCA     Family History  Problem Relation Age of Onset  . Hypertension Mother      Current Outpatient Medications:  .  aspirin EC 81 MG tablet, Take 81 mg by mouth daily., Disp: , Rfl:  .  atorvastatin (LIPITOR) 80 MG tablet, Take 1 tablet (80 mg total) by mouth daily at 6 PM., Disp: 90 tablet, Rfl: 3 .  Cranberry 1000 MG CAPS, Take 1 capsule by mouth daily at 12 noon., Disp: , Rfl:  .  docusate sodium (COLACE) 100 MG capsule, Take 200-300 mg by mouth daily as needed for mild constipation. , Disp: , Rfl:  .  levothyroxine (SYNTHROID) 175 MCG tablet, Take 1 tablet by mouth every am on Monday - Friday, Disp: 90 tablet, Rfl: 1 .  lisinopril-hydrochlorothiazide (ZESTORETIC) 20-25 MG tablet, Take 0.5 tablets by mouth 3 (three) times a week. Take on Monday Wednesday and  Friday only, Disp: 90 tablet, Rfl: 1 .  nitroGLYCERIN (NITROSTAT) 0.4 MG SL tablet, Place 1 tablet (0.4 mg total) under the tongue every 5 (five) minutes x 3 doses as needed for chest pain., Disp: 25 tablet, Rfl: 3 .  oxybutynin (DITROPAN-XL) 10 MG 24 hr tablet, TAKE 1 TABLET BY MOUTH EVERYDAY AT BEDTIME, Disp: 90 tablet, Rfl: 1 .  ticagrelor (BRILINTA) 90 MG TABS tablet, Take 1 tablet (90 mg total) by mouth 2 (two) times daily., Disp: 180 tablet, Rfl: 3  Current Facility-Administered Medications:  .  Ring Pessary/Support MISC 1 each, 1 each, Does not apply, Once, Anyanwu, Sallyanne Havers, MD   Allergies  Allergen Reactions  . Codeine Nausea And Vomiting  . Norvasc [Amlodipine] Rash and Other (See Comments)    Petechia     Review of Systems  Constitutional: Negative.   Respiratory: Negative.   Cardiovascular: Negative.  Negative for chest pain, palpitations and leg swelling.  Gastrointestinal: Negative.   Genitourinary: Positive for hematuria. Negative for dysuria, frequency and pelvic pain.  Neurological: Negative.   Psychiatric/Behavioral: Negative.      Today's Vitals   07/09/20 0945  BP: 130/78  Pulse: 70  Temp: 98 F (36.7 C)  TempSrc: Oral  Weight: 161 lb 3.2 oz (73.1 kg)  Height: 5\' 1"  (1.549 m)  Body mass index is 30.46 kg/m.   Objective:  Physical Exam Constitutional:      General: She is not in acute distress.    Appearance: Normal appearance.  Cardiovascular:     Pulses: Normal pulses.     Heart sounds: Normal heart sounds. No murmur heard.   Pulmonary:     Effort: Pulmonary effort is normal. No respiratory distress.     Breath sounds: Normal breath sounds.  Skin:    Capillary Refill: Capillary refill takes less than 2 seconds.  Neurological:     General: No focal deficit present.     Mental Status: She is alert and oriented to person, place, and time.     Cranial Nerves: No cranial nerve deficit.  Psychiatric:        Mood and Affect: Mood normal.         Behavior: Behavior normal.        Thought Content: Thought content normal.        Judgment: Judgment normal.         Assessment And Plan:     1. Hematuria, unspecified type  She has small blood in her urine and trace white cells  Will send for urine culture, pending results will need to recheck in 2 weeks if negative.  - POCT Urinalysis Dipstick (81002) - Culture, Urine   If negative will have her to repeat to check for blood   Patient was given opportunity to ask questions. Patient verbalized understanding of the plan and was able to repeat key elements of the plan. All questions were answered to their satisfaction.   Teola Bradley, FNP, have reviewed all documentation for this visit. The documentation on 07/09/20 for the exam, diagnosis, procedures, and orders are all accurate and complete.   THE PATIENT IS ENCOURAGED TO PRACTICE SOCIAL DISTANCING DUE TO THE COVID-19 PANDEMIC.

## 2020-07-09 NOTE — Patient Instructions (Signed)
Hematuria, Adult Hematuria is blood in the urine. Blood may be visible in the urine, or it may be identified with a test. This condition can be caused by infections of the bladder, urethra, kidney, or prostate. Other possible causes include:  Kidney stones.  Cancer of the urinary tract.  Too much calcium in the urine.  Conditions that are passed from parent to child (inherited conditions).  Exercise that requires a lot of energy. Infections can usually be treated with medicine, and a kidney stone usually will pass through your urine. If neither of these is the cause of your hematuria, more tests may be needed to identify the cause of your symptoms. It is very important to tell your health care provider about any blood in your urine, even if it is painless or the blood stops without treatment. Blood in the urine, when it happens and then stops and then happens again, can be a symptom of a very serious condition, including cancer. There is no pain in the initial stages of many urinary cancers. Follow these instructions at home: Medicines  Take over-the-counter and prescription medicines only as told by your health care provider.  If you were prescribed an antibiotic medicine, take it as told by your health care provider. Do not stop taking the antibiotic even if you start to feel better. Eating and drinking  Drink enough fluid to keep your urine clear or pale yellow. It is recommended that you drink 3-4 quarts (2.8-3.8 L) a day. If you have been diagnosed with an infection, it is recommended that you drink cranberry juice in addition to large amounts of water.  Avoid caffeine, tea, and carbonated beverages. These tend to irritate the bladder.  Avoid alcohol because it may irritate the prostate (men). General instructions  If you have been diagnosed with a kidney stone, follow your health care provider's instructions about straining your urine to catch the stone.  Empty your bladder  often. Avoid holding urine for long periods of time.  If you are female: ? After a bowel movement, wipe from front to back and use each piece of toilet paper only once. ? Empty your bladder before and after sex.  Pay attention to any changes in your symptoms. Tell your health care provider about any changes or any new symptoms.  It is your responsibility to get your test results. Ask your health care provider, or the department performing the test, when your results will be ready.  Keep all follow-up visits as told by your health care provider. This is important. Contact a health care provider if:  You develop back pain.  You have a fever.  You have nausea or vomiting.  Your symptoms do not improve after 3 days.  Your symptoms get worse. Get help right away if:  You develop severe vomiting and are unable take medicine without vomiting.  You develop severe pain in your back or abdomen even though you are taking medicine.  You pass a large amount of blood in your urine.  You pass blood clots in your urine.  You feel very weak or like you might faint.  You faint. Summary  Hematuria is blood in the urine. It has many possible causes.  It is very important that you tell your health care provider about any blood in your urine, even if it is painless or the blood stops without treatment.  Take over-the-counter and prescription medicines only as told by your health care provider.  Drink enough fluid to keep   your urine clear or pale yellow. This information is not intended to replace advice given to you by your health care provider. Make sure you discuss any questions you have with your health care provider. Document Revised: 01/05/2019 Document Reviewed: 09/13/2016 Elsevier Patient Education  2020 Elsevier Inc.  

## 2020-07-09 NOTE — Patient Instructions (Signed)
Medication Instructions:  Your physician recommends that you continue on your current medications as directed. Please refer to the Current Medication list given to you today.  *If you need a refill on your cardiac medications before your next appointment, please call your pharmacy*   Lab Work: None  If you have labs (blood work) drawn today and your tests are completely normal, you will receive your results only by: [name] Kitchen MyChart Message (if you have MyChart) OR . A paper copy in the mail If you have any lab test that is abnormal or we need to change your treatment, we will call you to review the results.   Testing/Procedures: None  Follow-Up: At [facility], you and your health needs are our priority.  As part of our continuing mission to provide you with exceptional heart care, we have created designated Provider Care Teams.  These Care Teams include your primary Cardiologist (physician) and Advanced Practice Providers (APPs -  Physician Assistants and Nurse Practitioners) who all work together to provide you with the care you need, when you need it.  We recommend signing up for the patient portal called "MyChart".  Sign up information is provided on this After Visit Summary.  MyChart is used to connect with patients for Virtual Visits (Telemedicine).  Patients are able to view lab/test results, encounter notes, upcoming appointments, etc.  Non-urgent messages can be sent to your provider as well.   To learn more about what you can do with MyChart, go to [email or web address].    Your next appointment:   6 month(s)  The format for your next appointment:   In Person  Provider:   You may see Larae Grooms, MD or one of the following Advanced Practice Providers on your designated Care Team:    Melina Copa, PA-C  Ermalinda Barrios, PA-C    Other Instructions None

## 2020-07-30 DIAGNOSIS — R319 Hematuria, unspecified: Secondary | ICD-10-CM | POA: Diagnosis not present

## 2020-08-01 LAB — URINE CULTURE

## 2020-08-15 ENCOUNTER — Other Ambulatory Visit: Payer: Self-pay

## 2020-08-15 ENCOUNTER — Ambulatory Visit: Payer: PPO | Admitting: Obstetrics & Gynecology

## 2020-08-15 ENCOUNTER — Encounter: Payer: Self-pay | Admitting: Obstetrics & Gynecology

## 2020-08-15 VITALS — BP 134/80

## 2020-08-15 DIAGNOSIS — Z4689 Encounter for fitting and adjustment of other specified devices: Secondary | ICD-10-CM

## 2020-08-15 DIAGNOSIS — N765 Ulceration of vagina: Secondary | ICD-10-CM | POA: Diagnosis not present

## 2020-08-15 NOTE — Progress Notes (Signed)
    Abigail [name] 01-13-1936 [id number]        84 y.o.  G2P2002   RP: Pessary maintenance  HPI:  Well on Milex ring #6 with support, but continued mild vaginal discharge tinged with blood.  No vaginal pain.  Very mild urinary incontinence only when gets ready to take her shower.  Able to work.   OB History  Gravida Para Term Preterm AB Living  2 2 2     2   SAB IAB Ectopic Multiple Live Births               # Outcome Date GA Lbr Len/2nd Weight Sex Delivery Anes PTL Lv  2 Term           1 Term             Past medical history,surgical history, problem list, medications, allergies, family history and social history were all reviewed and documented in the EPIC chart.   Directed ROS with pertinent positives and negatives documented in the history of present illness/assessment and plan.  Exam:  Vitals:   08/15/20 1400  BP: 134/80   General appearance:  Normal  Abdomen: Normal  Gynecologic exam: Vulva normal.  Mild d/c tinted with blood from the vagina.  Pessary removed and cleaned.  Speculum:  Erosion at the right vaginal Fornix with blood.     Assessment/Plan:  84 y.o. G2P2002   1. Pessary maintenance Milex pessary ring #6 with support causing vaginal ulceration.  Pessary left out for healing.  Milex #7 caused ulcerations as well.  Milex #5 was falling off.  Will reassess in 2 weeks.  2. Vaginal ulceration Recurrence of vaginal ulceration due to the pessary.  Pessary removed.  Will let the vaginal ulceration heal.  Will use a tampon with vaseline as needed if prolapse is preventing patient from being active.  [name] MD, 3:40 PM 08/15/2020

## 2020-08-29 ENCOUNTER — Telehealth: Payer: PPO

## 2020-08-31 ENCOUNTER — Encounter: Payer: Self-pay | Admitting: Obstetrics & Gynecology

## 2020-08-31 ENCOUNTER — Other Ambulatory Visit: Payer: Self-pay

## 2020-08-31 ENCOUNTER — Ambulatory Visit: Payer: PPO | Admitting: Obstetrics & Gynecology

## 2020-08-31 VITALS — BP 126/84

## 2020-08-31 DIAGNOSIS — N765 Ulceration of vagina: Secondary | ICD-10-CM

## 2020-08-31 DIAGNOSIS — Z4689 Encounter for fitting and adjustment of other specified devices: Secondary | ICD-10-CM

## 2020-08-31 NOTE — Progress Notes (Signed)
    [name] 04/02/1936 [id number]        84 y.o.  G2P2L2   RP: F/U Vaginal erosions from pessary  HPI: Milex ring #7 with support removed 2 weeks ago because of vaginal erosions.  Tried a tampon which helped for a few days, but probably fell off without patient noticing.   OB History  Gravida Para Term Preterm AB Living  2 2 2     2   SAB IAB Ectopic Multiple Live Births               # Outcome Date GA Lbr Len/2nd Weight Sex Delivery Anes PTL Lv  2 Term           1 Term             Past medical history,surgical history, problem list, medications, allergies, family history and social history were all reviewed and documented in the EPIC chart.   Directed ROS with pertinent positives and negatives documented in the history of present illness/assessment and plan.  Exam:  Vitals:   08/31/20 1422  BP: 126/84   General appearance:  Normal  Abdomen: Normal  Gynecologic exam:  Vulva normal.  Speculum:  Vaginal mucosa healing well.  No ulceration or erosion.  Mild erythema.  Yellowish discharge wnl.  Milex ring #5 with support fitted.   Assessment/Plan:  85 y.o. G2P2002   1. Vaginal ulceration Healed after 2 weeks without the pessary.  Decision to try a smaller pessary.    2. Fitting and adjustment of pessary Milex ring #5 with support fitted.  We had that pessary in the office, so patient left with her pessary in place.  [name] MD, 2:37 PM 08/31/2020

## 2020-09-04 ENCOUNTER — Encounter: Payer: Self-pay | Admitting: Obstetrics & Gynecology

## 2020-09-05 ENCOUNTER — Encounter: Payer: Self-pay | Admitting: Obstetrics & Gynecology

## 2020-09-05 ENCOUNTER — Ambulatory Visit: Payer: PPO | Admitting: Obstetrics & Gynecology

## 2020-09-05 ENCOUNTER — Other Ambulatory Visit: Payer: Self-pay

## 2020-09-05 VITALS — Temp 98.0°F

## 2020-09-05 DIAGNOSIS — R300 Dysuria: Secondary | ICD-10-CM

## 2020-09-05 DIAGNOSIS — Z4689 Encounter for fitting and adjustment of other specified devices: Secondary | ICD-10-CM

## 2020-09-05 MED ORDER — SULFAMETHOXAZOLE-TRIMETHOPRIM 800-160 MG PO TABS: 1.0000 | ORAL_TABLET | Freq: Two times a day (BID) | ORAL | 0 refills | Status: AC

## 2020-09-05 NOTE — Progress Notes (Signed)
    ANAIJAH AUGSBURGER 1935/09/02 [id number]        85 y.o.  G2P2002   RP: Pessary fell off again  HPI: New pessary Milex ring #5 with support fell off as soon as patient got in her car from the office last visit.  Pain with urination and chills last night.   OB History  Gravida Para Term Preterm AB Living  2 2 2     2   SAB IAB Ectopic Multiple Live Births               # Outcome Date GA Lbr Len/2nd Weight Sex Delivery Anes PTL Lv  2 Term           1 Term             Past medical history,surgical history, problem list, medications, allergies, family history and social history were all reviewed and documented in the EPIC chart.   Directed ROS with pertinent positives and negatives documented in the history of present illness/assessment and plan.  Exam:  There were no vitals filed for this visit. General appearance:  Normal  CVAT Negative bilaterally  Abdomen: Normal  Gynecologic exam: Vulva normal.  VE vaginal mucosa normal.  Decision to try a different kind of pessary.  An incontinence pessary #5 inserted.  Stayed in place in spite of valsalva and squatting.    U/A: Yellow cloudy, protein 2+, nitrites positive, white blood cells 40-60, red blood cells 10-20, bacteria many.  Urine culture pending.   Assessment/Plan:  85 y.o. G2P2002   1. Fitting and adjustment of pessary Very difficult management with Milex ring pessaries with support.  The #7 pessary caused erosion and ulceration of the vaginal mucosa and the smaller pessaries #6 and #5 fell off.  Decision to try an incontinence pessary with support and knob #5.  If this is not successful, we may consider surgical correction although patient would need cardio clearance as she had an MI in March 2021.  2. Dysuria Acute cystitis.  No evidence of pyelonephritis.  No fever, temperature was 98 F.  Decision to treat with Bactrim DS 1 tablet twice a day for 3 days.  Precautions discussed with patient, will present to the emergency  room if develops a fever or if does not feel better within 12 hours. - Urinalysis,Complete w/RFL Culture  Other orders - sulfamethoxazole-trimethoprim (BACTRIM DS) 800-160 MG tablet; Take 1 tablet by mouth 2 (two) times daily for 3 days. - Urine Culture - REFLEXIVE URINE CULTURE  [name] MD, 3:11 PM 09/05/2020

## 2020-09-08 LAB — URINALYSIS, COMPLETE W/RFL CULTURE
Bilirubin Urine: NEGATIVE
Glucose, UA: NEGATIVE
Hyaline Cast: NONE SEEN /LPF
Ketones, ur: NEGATIVE
Nitrites, Initial: POSITIVE — AB
Specific Gravity, Urine: 1.015 (ref 1.001–1.03)
pH: 5.5 (ref 5.0–8.0)

## 2020-09-08 LAB — URINE CULTURE
MICRO NUMBER:: 11409720
SPECIMEN QUALITY:: ADEQUATE

## 2020-09-08 LAB — CULTURE INDICATED

## 2020-09-17 ENCOUNTER — Telehealth: Payer: Self-pay

## 2020-09-17 NOTE — Telephone Encounter (Signed)
Patient called stating she was in a few weeks ago and "where to go from here?"  I told her last note said Dr. Marguerita Merles wanted her to return in 2 weeks to follow up. Encouraged her to schedule a visit. High priority staff message sent to Butch Penny to call patient and arrange visit.

## 2020-09-18 ENCOUNTER — Other Ambulatory Visit: Payer: Self-pay

## 2020-09-18 ENCOUNTER — Encounter: Payer: Self-pay | Admitting: Obstetrics & Gynecology

## 2020-09-18 ENCOUNTER — Telehealth: Payer: Self-pay | Admitting: *Deleted

## 2020-09-18 ENCOUNTER — Ambulatory Visit: Payer: PPO | Admitting: Obstetrics & Gynecology

## 2020-09-18 VITALS — BP 138/80 | HR 80 | Resp 14 | Ht 64.0 in | Wt 150.0 lb

## 2020-09-18 DIAGNOSIS — Z4689 Encounter for fitting and adjustment of other specified devices: Secondary | ICD-10-CM | POA: Diagnosis not present

## 2020-09-18 DIAGNOSIS — N3000 Acute cystitis without hematuria: Secondary | ICD-10-CM

## 2020-09-18 DIAGNOSIS — N814 Uterovaginal prolapse, unspecified: Secondary | ICD-10-CM | POA: Diagnosis not present

## 2020-09-18 DIAGNOSIS — N813 Complete uterovaginal prolapse: Secondary | ICD-10-CM

## 2020-09-18 DIAGNOSIS — R300 Dysuria: Secondary | ICD-10-CM | POA: Diagnosis not present

## 2020-09-18 DIAGNOSIS — N8110 Cystocele, unspecified: Secondary | ICD-10-CM | POA: Diagnosis not present

## 2020-09-18 MED ORDER — NITROFURANTOIN MONOHYD MACRO 100 MG PO CAPS: 100.0000 mg | ORAL_CAPSULE | Freq: Two times a day (BID) | ORAL | 0 refills | Status: DC

## 2020-09-18 MED ORDER — ATORVASTATIN CALCIUM 80 MG PO TABS
80.0000 mg | ORAL_TABLET | Freq: Every day | ORAL | 3 refills | Status: DC
Start: 2020-09-18 — End: 2023-10-28

## 2020-09-18 NOTE — Telephone Encounter (Signed)
-----   Message from [name], MD sent at 09/18/2020  2:40 PM EST ----- Regarding: Refer to UroGyn Dr Wannetta Sender, Sharyn Lull Large Cystocele grade 4/4 and Uterine prolapse 1/3.  No significant Rectocele.  Patient is 85 yo and still working :-)  Had an MI 10/2019.  Tried multiple pessaries, mostly Milex rings with support.  #6 and smaller fall out, #7 caused erosion.  When patient doesn't have a pessary in place, she gets recurrent cystitis.  So, Milex ring #7 put in place again, although it is too large :-(   Recommend a surgical correction.  Thank you for evaluating and managing.

## 2020-09-18 NOTE — Telephone Encounter (Signed)
Referral placed at [facility] office they will call to schedule.

## 2020-09-18 NOTE — Progress Notes (Signed)
    Abigail [name] 02-07-1936 [id number]        85 y.o.  G2P2002   RP: Pessary fell off/pain with urination  HPI: No success with pessaries attempted, either too large with erosion/ulceration or too small and falling off.  UTI Sxs present.  No fever.   OB History  Gravida Para Term Preterm AB Living  2 2 2     2   SAB IAB Ectopic Multiple Live Births               # Outcome Date GA Lbr Len/2nd Weight Sex Delivery Anes PTL Lv  2 Term           1 Term             Past medical history,surgical history, problem list, medications, allergies, family history and social history were all reviewed and documented in the EPIC chart.   Directed ROS with pertinent positives and negatives documented in the history of present illness/assessment and plan.  Exam:  Vitals:   09/18/20 1400  BP: 138/80  Pulse: 80  Resp: 14  Weight: 150 lb (68 kg)  Height: 5\' 4"  (1.626 m)   General appearance:  Normal  Abdomen: Normal  Gynecologic exam: Vulva normal.  Cystocele grade 4/4.  Uterine prolapse grade 1-2/3 with valsalva in laying down position.  Milex ring #7 with support inserted (too large, but only one that holds in place).  U/A: Yellow cloudy, protein trace, nitrites positive, white blood cells 20-40, red blood cells 0-2, bacteria many.  Urine culture pending.   Assessment/Plan:  85 y.o. G2P2002   1. Baden-Walker grade 4 cystocele No success with many pessaries attempted, either too large with erosion/ulceration or too small and falling off.  Developing recurrent Cystitis when not wearing a pessary.  Decision to refer to Dr Baldo Ash for evaluation and management.  2. Cystocele with uterine prolapse Previously complete uterine prolapse documented, but on today's exam with valsalva, just grade 1-2/3 uterine prolapse.  3. Fitting and adjustment of pessary Milex ring #7 with support put in place (too large, but only one that stays in place).  4. Dysuria Probable acute cystitis.   Will treat with MacroBID.  Prescription sent to pharmacy.  5. Acute recurrent cystitis Referred to Uro-gyn.  Other orders - nitrofurantoin, macrocrystal-monohydrate, (MACROBID) 100 MG capsule; Take 1 capsule (100 mg total) by mouth 2 (two) times daily.  [name] MD, 2:39 PM 09/18/2020

## 2020-09-19 ENCOUNTER — Ambulatory Visit: Payer: PPO | Admitting: Nurse Practitioner

## 2020-09-21 LAB — URINALYSIS, COMPLETE W/RFL CULTURE
Bilirubin Urine: NEGATIVE
Glucose, UA: NEGATIVE
Hgb urine dipstick: NEGATIVE
Hyaline Cast: NONE SEEN /LPF
Ketones, ur: NEGATIVE
Nitrites, Initial: POSITIVE — AB
Specific Gravity, Urine: 1.015 (ref 1.001–1.03)
pH: 6 (ref 5.0–8.0)

## 2020-09-21 LAB — URINE CULTURE
MICRO NUMBER:: 11454124
SPECIMEN QUALITY:: ADEQUATE

## 2020-09-21 LAB — CULTURE INDICATED

## 2020-09-25 NOTE — Telephone Encounter (Signed)
Patient scheduled on 10/26/20

## 2020-09-28 ENCOUNTER — Telehealth: Payer: Self-pay | Admitting: *Deleted

## 2020-09-28 ENCOUNTER — Other Ambulatory Visit: Payer: Self-pay | Admitting: Nurse Practitioner

## 2020-09-28 DIAGNOSIS — E039 Hypothyroidism, unspecified: Secondary | ICD-10-CM

## 2020-09-28 MED ORDER — NITROFURANTOIN MONOHYD MACRO 100 MG PO CAPS: 100.0000 mg | ORAL_CAPSULE | Freq: Two times a day (BID) | ORAL | 0 refills | Status: DC

## 2020-09-28 NOTE — Telephone Encounter (Signed)
Left message on voicemail Rx sent.

## 2020-09-28 NOTE — Telephone Encounter (Signed)
Patient called asking if refill can be sent for Macrobid 100 mg cap was treated for UTI in Jan 2022. Patient is not able to tell me any classic UTI symptoms. States she doesn't feel good, feels shaky and vision seem blurred, however she does have pressure, her pessary fell out and is having a hard time with this. She did feel better when she took the [place] previously.   She is scheduled for OV on pessary insertion on 10/01/20. Patient was told not more appointment available for today. Please advise

## 2020-09-28 NOTE — Telephone Encounter (Signed)
Yes agree with refill for MacroBID.  Please advance referral with Dr Windy Canny as much as possible.

## 2020-10-01 ENCOUNTER — Other Ambulatory Visit: Payer: Self-pay

## 2020-10-01 ENCOUNTER — Telehealth: Payer: PPO

## 2020-10-01 ENCOUNTER — Encounter: Payer: Self-pay | Admitting: Obstetrics & Gynecology

## 2020-10-01 ENCOUNTER — Ambulatory Visit: Payer: PPO | Admitting: Obstetrics & Gynecology

## 2020-10-01 VITALS — BP 130/80

## 2020-10-01 DIAGNOSIS — R350 Frequency of micturition: Secondary | ICD-10-CM | POA: Diagnosis not present

## 2020-10-01 DIAGNOSIS — Z4689 Encounter for fitting and adjustment of other specified devices: Secondary | ICD-10-CM | POA: Diagnosis not present

## 2020-10-01 NOTE — Progress Notes (Signed)
    Abigail [name] Oct 23, 1935 [id number]        85 y.o.  G2P2002   RP: Pessary fell out again  HPI: Milex ring #7 with support which had stayed in place previously now fell out while passing a BM.  No vaginal d/c.  No vaginal bleeding. No UTI Sx currently.  Called last Friday with general Sxs which she attributed to a possible UTI, but didn't start the ABTx and now feels better.  No fever. Patient Has an appointment scheduled with Dr Windy Canny 10/26/2020.    OB History  Gravida Para Term Preterm AB Living  2 2 2     2   SAB IAB Ectopic Multiple Live Births               # Outcome Date GA Lbr Len/2nd Weight Sex Delivery Anes PTL Lv  2 Term           1 Term             Past medical history,surgical history, problem list, medications, allergies, family history and social history were all reviewed and documented in the EPIC chart.   Directed ROS with pertinent positives and negatives documented in the history of present illness/assessment and plan.  Exam:  Vitals:   10/01/20 1136  BP: 130/80   General appearance:  Normal  Abdomen: Normal  Gynecologic exam: Vulva normal.  Vaginal mucosa intact, no erythema, no erosion, no ulceration.  Gelhorn size 3.0 In fitted very successfully, feels like the best fit so far.  U/A Negative   Assessment/Plan:  85 y.o. G2P2002   1. Encounter for fitting and adjustment of pessary Difficulties finding the right pessary for uterine prolapse with cystocele.  No vaginal erosion/ulceration.  Gelhorn size 3.0 In fitted today.  Best fit so far per exam and patient's sensation.  Pessary didn't fall out with valsalva.  F/U 1 week to reassess and order that pessary if working well for patient.  Appointment with Dr Wannetta Sender Uro-Gyn 10/2020.  2. Urinary frequency U/A Negative, patient reassured. - Urinalysis,Complete w/RFL Culture  [name] MD, 12:03 PM 10/01/2020

## 2020-10-02 ENCOUNTER — Encounter: Payer: Self-pay | Admitting: Obstetrics & Gynecology

## 2020-10-03 ENCOUNTER — Other Ambulatory Visit: Payer: Self-pay

## 2020-10-03 ENCOUNTER — Ambulatory Visit: Payer: PPO | Admitting: Nurse Practitioner

## 2020-10-03 ENCOUNTER — Encounter: Payer: Self-pay | Admitting: Nurse Practitioner

## 2020-10-03 ENCOUNTER — Ambulatory Visit (INDEPENDENT_AMBULATORY_CARE_PROVIDER_SITE_OTHER): Payer: PPO | Admitting: Nurse Practitioner

## 2020-10-03 VITALS — BP 132/70 | HR 82 | Temp 98.2°F | Ht 64.0 in | Wt 150.0 lb

## 2020-10-03 DIAGNOSIS — E782 Mixed hyperlipidemia: Secondary | ICD-10-CM

## 2020-10-03 DIAGNOSIS — E039 Hypothyroidism, unspecified: Secondary | ICD-10-CM | POA: Diagnosis not present

## 2020-10-03 DIAGNOSIS — I10 Essential (primary) hypertension: Secondary | ICD-10-CM | POA: Diagnosis not present

## 2020-10-03 DIAGNOSIS — R7303 Prediabetes: Secondary | ICD-10-CM

## 2020-10-03 LAB — URINALYSIS, COMPLETE W/RFL CULTURE
Bilirubin Urine: NEGATIVE
Glucose, UA: NEGATIVE
Hgb urine dipstick: NEGATIVE
Hyaline Cast: NONE SEEN /LPF
Ketones, ur: NEGATIVE
Leukocyte Esterase: NEGATIVE
Nitrites, Initial: NEGATIVE
Protein, ur: NEGATIVE
RBC / HPF: NONE SEEN /HPF (ref 0–2)
Specific Gravity, Urine: 1.01 (ref 1.001–1.03)
pH: 6 (ref 5.0–8.0)

## 2020-10-03 LAB — URINE CULTURE
MICRO NUMBER:: 11502583
SPECIMEN QUALITY:: ADEQUATE

## 2020-10-03 LAB — CULTURE INDICATED

## 2020-10-03 NOTE — Progress Notes (Signed)
I,Yamilka Roman [employer] as a Education administrator for [employer], FNP.,have documented all relevant documentation on the behalf of [name], FNP,as directed by  [name], FNP while in the presence of [name], [place]. This visit occurred during the SARS-CoV-2 public health emergency.  Safety protocols were in place, including screening questions prior to the visit, additional usage of staff PPE, and extensive cleaning of exam room while observing appropriate contact time as indicated for disinfecting solutions.  Subjective:     Patient ID: Abigail [name] , female    DOB: 28-Jun-1936 , 85 y.o.   MRN: [id number]   Chief Complaint  Patient presents with  . Hypothyroidism    HPI  She has been referred to a Urogynecologist in March. She does have a new pessary in place as of monday  Thyroid Problem Presents for follow-up visit. Patient reports no cold intolerance, constipation, fatigue, tremors or weight loss. The symptoms have been stable.     Past Medical History:  Diagnosis Date  . Carotid artery occlusion   . Complication of anesthesia    hard to wake up per pt  . Diverticulitis   . Hypertension   . Hypothyroid   . Intrauterine pessary   . STEMI (ST elevation myocardial infarction) ([place]) 10/26/2019   DES RCA     Family History  Problem Relation Age of Onset  . Hypertension Mother      Current Outpatient Medications:  .  aspirin EC 81 MG tablet, Take 81 mg by mouth daily., Disp: , Rfl:  .  atorvastatin (LIPITOR) 80 MG tablet, Take 1 tablet (80 mg total) by mouth daily at 6 PM., Disp: 90 tablet, Rfl: 3 .  Cranberry 1000 MG CAPS, Take 1 capsule by mouth daily at 12 noon., Disp: , Rfl:  .  docusate sodium (COLACE) 100 MG capsule, Take 200-300 mg by mouth daily as needed for mild constipation. , Disp: , Rfl:  .  levothyroxine (SYNTHROID) 175 MCG tablet, TAKE 1 TABLET BY MOUTH EVERY MORNING ON MONDAY - FRIDAY, Disp: 90 tablet, Rfl: 1 .  lisinopril-hydrochlorothiazide  (ZESTORETIC) 20-25 MG tablet, Take 0.5 tablets by mouth 3 (three) times a week. Take on Monday Wednesday and Friday only, Disp: 90 tablet, Rfl: 1 .  nitroGLYCERIN (NITROSTAT) 0.4 MG SL tablet, Place 1 tablet (0.4 mg total) under the tongue every 5 (five) minutes x 3 doses as needed for chest pain., Disp: 25 tablet, Rfl: 3 .  oxybutynin (DITROPAN-XL) 10 MG 24 hr tablet, TAKE 1 TABLET BY MOUTH EVERYDAY AT BEDTIME, Disp: 90 tablet, Rfl: 1 .  ticagrelor (BRILINTA) 90 MG TABS tablet, Take 1 tablet (90 mg total) by mouth 2 (two) times daily., Disp: 180 tablet, Rfl: 3 .  nitrofurantoin, macrocrystal-monohydrate, (MACROBID) 100 MG capsule, Take 1 capsule (100 mg total) by mouth 2 (two) times daily. (Patient not taking: Reported on 10/03/2020), Disp: 14 capsule, Rfl: 0  Current Facility-Administered Medications:  .  Ring Pessary/Support MISC 1 each, 1 each, Does not apply, Once, Anyanwu, Sallyanne Havers, MD   Allergies  Allergen Reactions  . Codeine Nausea And Vomiting  . Norvasc [Amlodipine] Rash and Other (See Comments)    [place]     Review of Systems  Constitutional: Negative for fatigue and weight loss.  Respiratory: Negative.   Cardiovascular: Negative.   Gastrointestinal: Negative for constipation.  Endocrine: Negative for cold intolerance.  Neurological: Negative for tremors.  Psychiatric/Behavioral: Negative.      Today's Vitals   10/03/20 1021  BP: 132/70  Pulse: 82  Temp: 98.2 F (36.8 C)  TempSrc: Oral  Weight: 150 lb (68 kg)  Height: '5\' 4"'  (1.626 m)  PainSc: 0-No pain   Body mass index is 25.75 kg/m.   Objective:  Physical Exam Vitals reviewed.  Constitutional:      General: She is not in acute distress.    Appearance: Normal appearance.  Cardiovascular:     Rate and Rhythm: Normal rate and regular rhythm.     Pulses: Normal pulses.     Heart sounds: Normal heart sounds. No murmur heard.   Pulmonary:     Effort: Pulmonary effort is normal. No respiratory distress.      Breath sounds: Normal breath sounds.  Neurological:     General: No focal deficit present.     Mental Status: She is alert and oriented to person, place, and time.     Cranial Nerves: No cranial nerve deficit.  Psychiatric:        Mood and Affect: Mood normal.        Behavior: Behavior normal.        Thought Content: Thought content normal.        Judgment: Judgment normal.         Assessment And Plan:     1. Acquired hypothyroidism  Chronic, within normal limits after adjusting medications - TSH - T3, free - T4  2. Prediabetes  Chronic, stable  Continue with healthy diet low in sugar and carbohydrates - Hemoglobin A1c  3. Mixed hyperlipidemia  Chronic, within normal limits at last visit  Continue to limit fried and fatty foods intake  4. Essential hypertension  Chronic, well controlled  Continue with current medications - BMP8+eGFR     Patient was given opportunity to ask questions. Patient verbalized understanding of the plan and was able to repeat key elements of the plan. All questions were answered to their satisfaction.  [name], FNP   I, [name], FNP, have reviewed all documentation for this visit. The documentation on 10/03/20 for the exam, diagnosis, procedures, and orders are all accurate and complete.   THE PATIENT IS ENCOURAGED TO PRACTICE SOCIAL DISTANCING DUE TO THE COVID-19 PANDEMIC.

## 2020-10-03 NOTE — Patient Instructions (Signed)
Hypothyroidism  Hypothyroidism is when the thyroid gland does not make enough of certain hormones (it is underactive). The thyroid gland is a small gland located in the lower front part of the neck, just in front of the windpipe (trachea). This gland makes hormones that help control how the body uses food for energy (metabolism) as well as how the heart and brain function. These hormones also play a role in keeping your bones strong. When the thyroid is underactive, it produces too little of the hormones thyroxine (T4) and triiodothyronine (T3). What are the causes? This condition may be caused by:  Hashimoto's disease. This is a disease in which the body's disease-fighting system (immune system) attacks the thyroid gland. This is the most common cause.  Viral infections.  Pregnancy.  Certain medicines.  Birth defects.  Past radiation treatments to the head or neck for cancer.  Past treatment with radioactive iodine.  Past exposure to radiation in the environment.  Past surgical removal of part or all of the thyroid.  Problems with a gland in the center of the brain (pituitary gland).  Lack of enough iodine in the diet. What increases the risk? You are more likely to develop this condition if:  You are female.  You have a family history of thyroid conditions.  You use a medicine called lithium.  You take medicines that affect the immune system (immunosuppressants). What are the signs or symptoms? Symptoms of this condition include:  Feeling as though you have no energy (lethargy).  Not being able to tolerate cold.  Weight gain that is not explained by a change in diet or exercise habits.  Lack of appetite.  Dry skin.  Coarse hair.  Menstrual irregularity.  Slowing of thought processes.  Constipation.  Sadness or depression. How is this diagnosed? This condition may be diagnosed based on:  Your symptoms, your medical history, and a physical exam.  Blood  tests. You may also have imaging tests, such as an ultrasound or MRI. How is this treated? This condition is treated with medicine that replaces the thyroid hormones that your body does not make. After you begin treatment, it may take several weeks for symptoms to go away. Follow these instructions at home:  Take over-the-counter and prescription medicines only as told by your health care provider.  If you start taking any new medicines, tell your health care provider.  Keep all follow-up visits as told by your health care provider. This is important. ? As your condition improves, your dosage of thyroid hormone medicine may change. ? You will need to have blood tests regularly so that your health care provider can monitor your condition. Contact a health care provider if:  Your symptoms do not get better with treatment.  You are taking thyroid hormone replacement medicine and you: ? Sweat a lot. ? Have tremors. ? Feel anxious. ? Lose weight rapidly. ? Cannot tolerate heat. ? Have emotional swings. ? Have diarrhea. ? Feel weak. Get help right away if you have:  Chest pain.  An irregular heartbeat.  A rapid heartbeat.  Difficulty breathing. Summary  Hypothyroidism is when the thyroid gland does not make enough of certain hormones (it is underactive).  When the thyroid is underactive, it produces too little of the hormones thyroxine (T4) and triiodothyronine (T3).  The most common cause is Hashimoto's disease, a disease in which the body's disease-fighting system (immune system) attacks the thyroid gland. The condition can also be caused by viral infections, medicine, pregnancy, or   past radiation treatment to the head or neck.  Symptoms may include weight gain, dry skin, constipation, feeling as though you do not have energy, and not being able to tolerate cold.  This condition is treated with medicine to replace the thyroid hormones that your body does not make. This  information is not intended to replace advice given to you by your health care provider. Make sure you discuss any questions you have with your health care provider. Document Revised: 05/11/2020 Document Reviewed: 04/26/2020 Elsevier Patient Education  2021 [employer] Inc.  

## 2020-10-04 LAB — BMP8+EGFR
BUN/Creatinine Ratio: 15 (ref 12–28)
BUN: 20 mg/dL (ref 8–27)
CO2: 17 mmol/L — ABNORMAL LOW (ref 20–29)
Calcium: 9.4 mg/dL (ref 8.7–10.3)
Chloride: 98 mmol/L (ref 96–106)
Creatinine, Ser: 1.31 mg/dL — ABNORMAL HIGH (ref 0.57–1.00)
GFR calc Af Amer: 44 mL/min/{1.73_m2} — ABNORMAL LOW (ref >59)
GFR calc non Af Amer: 38 mL/min/{1.73_m2} — ABNORMAL LOW (ref >59)
Glucose: 111 mg/dL — ABNORMAL HIGH (ref 65–99)
Potassium: 5 mmol/L (ref 3.5–5.2)
Sodium: 134 mmol/L (ref 134–144)

## 2020-10-04 LAB — HEMOGLOBIN A1C
Est. average glucose Bld gHb Est-mCnc: 126 mg/dL
Hgb A1c MFr Bld: 6 % — ABNORMAL HIGH (ref 4.8–5.6)

## 2020-10-04 LAB — TSH: TSH: 0.528 u[IU]/mL (ref 0.450–4.500)

## 2020-10-04 LAB — T3, FREE: T3, Free: 2.8 pg/mL (ref 2.0–4.4)

## 2020-10-04 LAB — T4: T4, Total: 9.7 ug/dL (ref 4.5–12.0)

## 2020-10-09 ENCOUNTER — Encounter: Payer: Self-pay | Admitting: Obstetrics & Gynecology

## 2020-10-09 ENCOUNTER — Other Ambulatory Visit: Payer: Self-pay

## 2020-10-09 ENCOUNTER — Ambulatory Visit: Payer: PPO | Admitting: Obstetrics & Gynecology

## 2020-10-09 VITALS — BP 140/84 | Ht 64.0 in | Wt 150.0 lb

## 2020-10-09 DIAGNOSIS — Z4689 Encounter for fitting and adjustment of other specified devices: Secondary | ICD-10-CM | POA: Diagnosis not present

## 2020-10-09 NOTE — Progress Notes (Signed)
    Abigail [name] 06-29-1936 [id number]        85 y.o.  G2P2002   RP: Pessary maintenance  HPI: Much much better with that new pessary, a Gelhorn size 3 inches.  Pessary stayed in place with no pain.  No vaginal discharge or bleeding.     OB History  Gravida Para Term Preterm AB Living  2 2 2     2   SAB IAB Ectopic Multiple Live Births               # Outcome Date GA Lbr Len/2nd Weight Sex Delivery Anes PTL Lv  2 Term           1 Term             Past medical history,surgical history, problem list, medications, allergies, family history and social history were all reviewed and documented in the EPIC chart.   Directed ROS with pertinent positives and negatives documented in the history of present illness/assessment and plan.  Exam:  Vitals:   10/09/20 1033  BP: 140/84  Weight: 150 lb (68 kg)  Height: 5\' 4"  (1.626 m)   General appearance:  Normal  Mid back with erythema at site of bra attachment.  Recommended not to wear her new bra anymore, no bra x 2 weeks, then go back to previous bra type.  Abdomen: Normal  Gynecologic exam: Vulva normal.  Vaginal mucosa intact, normal secretions.  Pessary in good position.   Assessment/Plan:  85 y.o. G2P2002   1. Pessary maintenance Well with Gelhorn pessary 3 inches.  Will order and patient will come back for insertion.  Pessary fitter left in place.  [name] MD, 11:06 AM 10/09/2020

## 2020-10-16 ENCOUNTER — Encounter: Payer: Self-pay | Admitting: Obstetrics & Gynecology

## 2020-10-23 ENCOUNTER — Telehealth: Payer: Self-pay | Admitting: *Deleted

## 2020-10-23 NOTE — Telephone Encounter (Signed)
Gellhorm pessary 3 inches arrived in office today.  Call placed to patient. OV scheduled for 10/24/20 at 11am w/ Dr. Dellis Filbert.  Patient is agreeable to date and time.  Encounter closed.

## 2020-10-24 ENCOUNTER — Ambulatory Visit: Payer: PPO | Admitting: Obstetrics & Gynecology

## 2020-10-24 ENCOUNTER — Other Ambulatory Visit: Payer: Self-pay

## 2020-10-24 ENCOUNTER — Encounter: Payer: Self-pay | Admitting: Obstetrics & Gynecology

## 2020-10-24 DIAGNOSIS — Z4689 Encounter for fitting and adjustment of other specified devices: Secondary | ICD-10-CM | POA: Diagnosis not present

## 2020-10-24 DIAGNOSIS — R300 Dysuria: Secondary | ICD-10-CM

## 2020-10-24 NOTE — Progress Notes (Signed)
    Abigail [name] August 18, 1936 [id number]        85 y.o.  G2P2002   RP: New pessary Gelhorn 3 inches received for insertion  HPI: Well on the Gelhorn 3 in pessary with no vaginal bleeding or discharge.  No pelvic pain or vaginal pain.  C/O general symptoms of weakness and fatigue.  Would like to make sure she doesn't have a bladder infection.  No blood seen in urine, no frequency, no fever.   OB History  Gravida Para Term Preterm AB Living  2 2 2     2   SAB IAB Ectopic Multiple Live Births               # Outcome Date GA Lbr Len/2nd Weight Sex Delivery Anes PTL Lv  2 Term           1 Term             Past medical history,surgical history, problem list, medications, allergies, family history and social history were all reviewed and documented in the EPIC chart.   Directed ROS with pertinent positives and negatives documented in the history of present illness/assessment and plan.  Exam:  There were no vitals filed for this visit. General appearance:  Normal  Abdomen: Normal  Gynecologic exam: Vulva normal.  Gelhorn 3 in tester removed.  Vaginal mucosa intact.  Patient's pessary Gelhorn 3 in inserted.  Removal and insertion very uncomfortable and associated with mild bleeding.  Patient not able to produce enough urine for a U/A, pending Urine culture.   Assessment/Plan:  85 y.o. G2P2002   1. Fitting and adjustment of pessary Gelhorn pessary fitting well.  Patient is comfortable when in place, but very difficult to insert, but more so to remove, which caused bleeding.  Recommend keeping her appointment this Friday with Dr Despina Pole to consider alternative management options.  2. Dysuria Not enough urine for U/A.  Will wait on U. Culture to decide if ABTx are indicated.  Push water intake. - Urinalysis,Complete w/RFL Culture  [name] MD, 11:34 AM 10/24/2020

## 2020-10-25 ENCOUNTER — Telehealth: Payer: Self-pay

## 2020-10-25 NOTE — Telephone Encounter (Signed)
Attempt made to contact Abigail [name] is a 85 y.o. female re: New pt Pre appt call to collect history information. -Allergy -Medication -Confirm pharmacy -OB history   Pt was not available. LM on the VM for the patient to call back
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
## 2020-10-25 NOTE — Telephone Encounter (Signed)
Abigail [name] is a 85 y.o. female was called and contacted re: New pt Pre appt call to collect history information. -Allergy -Medication -Confirm pharmacy -OB history   Pt was available.Chart was updated. Pt was notified to arrive 15 min early and we will need a urine sample when she arrives. Pt verbalized understanding.

## 2020-10-26 ENCOUNTER — Encounter: Payer: Self-pay | Admitting: Obstetrics and Gynecology

## 2020-10-26 ENCOUNTER — Other Ambulatory Visit: Payer: Self-pay | Admitting: *Deleted

## 2020-10-26 ENCOUNTER — Ambulatory Visit (INDEPENDENT_AMBULATORY_CARE_PROVIDER_SITE_OTHER): Payer: PPO | Admitting: Obstetrics and Gynecology

## 2020-10-26 ENCOUNTER — Other Ambulatory Visit: Payer: Self-pay

## 2020-10-26 VITALS — BP 145/65 | HR 79 | Ht 63.0 in | Wt 156.0 lb

## 2020-10-26 DIAGNOSIS — N3281 Overactive bladder: Secondary | ICD-10-CM

## 2020-10-26 DIAGNOSIS — N393 Stress incontinence (female) (male): Secondary | ICD-10-CM | POA: Diagnosis not present

## 2020-10-26 DIAGNOSIS — N952 Postmenopausal atrophic vaginitis: Secondary | ICD-10-CM | POA: Diagnosis not present

## 2020-10-26 DIAGNOSIS — N390 Urinary tract infection, site not specified: Secondary | ICD-10-CM | POA: Diagnosis not present

## 2020-10-26 LAB — URINALYSIS, COMPLETE W/RFL CULTURE
Bilirubin Urine: NEGATIVE
Glucose, UA: NEGATIVE
Ketones, ur: NEGATIVE
Nitrites, Initial: NEGATIVE
Protein, ur: NEGATIVE
Specific Gravity, Urine: 1.008 (ref 1.001–1.03)
pH: 6 (ref 5.0–8.0)

## 2020-10-26 LAB — CULTURE INDICATED

## 2020-10-26 LAB — URINE CULTURE
MICRO NUMBER:: 11599687
SPECIMEN QUALITY:: ADEQUATE

## 2020-10-26 MED ORDER — CIPROFLOXACIN HCL 250 MG PO TABS: 250.0000 mg | ORAL_TABLET | Freq: Two times a day (BID) | ORAL | 0 refills | Status: DC

## 2020-10-26 MED ORDER — ESTRADIOL 0.1 MG/GM VA CREA: 0.5000 g | TOPICAL_CREAM | VAGINAL | 11 refills | Status: DC

## 2020-10-26 MED ORDER — TROSPIUM CHLORIDE ER 60 MG PO CP24: 1.0000 | ORAL_CAPSULE | Freq: Every day | ORAL | 5 refills | Status: DC

## 2020-10-26 MED ORDER — SULFAMETHOXAZOLE-TRIMETHOPRIM 400-80 MG PO TABS: 1.0000 | ORAL_TABLET | Freq: Every day | ORAL | 5 refills | Status: DC

## 2020-10-26 MED ORDER — TRIMO-SAN 0.025 % VA GEL
1.0000 | VAGINAL | 5 refills | Status: DC | PRN
Start: 2020-10-26 — End: 2021-01-31

## 2020-10-26 NOTE — Patient Instructions (Signed)
Stop Oxybutynin and start Trospium instead for overactive bladder symptoms.   Once you complete the ciprofloxacin, start Bactrim once a day for six months.   We placed a referral for pelvic floor physical therapy for your leakage of urine.   For treatment of stress urinary incontinence, which is leakage with physical activity/movement/strainging/coughing, we discussed expectant management versus nonsurgical options versus surgery. Nonsurgical options include weight loss, physical therapy, as well as a pessary or  transurethral injection of a bulking agent.

## 2020-10-29 ENCOUNTER — Other Ambulatory Visit: Payer: Self-pay | Admitting: Obstetrics and Gynecology

## 2020-10-29 ENCOUNTER — Ambulatory Visit (INDEPENDENT_AMBULATORY_CARE_PROVIDER_SITE_OTHER): Payer: PPO

## 2020-10-29 ENCOUNTER — Telehealth: Payer: PPO

## 2020-10-29 DIAGNOSIS — E782 Mixed hyperlipidemia: Secondary | ICD-10-CM | POA: Diagnosis not present

## 2020-10-29 DIAGNOSIS — R7303 Prediabetes: Secondary | ICD-10-CM

## 2020-10-29 DIAGNOSIS — I10 Essential (primary) hypertension: Secondary | ICD-10-CM

## 2020-10-29 DIAGNOSIS — E039 Hypothyroidism, unspecified: Secondary | ICD-10-CM

## 2020-10-29 DIAGNOSIS — N952 Postmenopausal atrophic vaginitis: Secondary | ICD-10-CM

## 2020-10-29 DIAGNOSIS — F419 Anxiety disorder, unspecified: Secondary | ICD-10-CM

## 2020-10-30 ENCOUNTER — Telehealth: Payer: Self-pay

## 2020-10-30 NOTE — Telephone Encounter (Signed)
Patient informed. Patient never had UTI sx but said her dizziness and weakness is not better. She said she asked Dr. Marguerita Merles to check her urine because she has felt weak and dizzy since last June and has frequent UTI's.  She saw Dr. Wannetta Sender ([facility]N [facility]) 10/26/2020  (after her visit with Dr. Dellis Filbert) and she has a plan with her to prevent recurrent UTI's.  I encouraged patient to finish the Cipro.  She has seen her PCP about this as well as cardiologist who follows her since she reports these symptoms since last June 2021.

## 2020-10-30 NOTE — Telephone Encounter (Signed)
There is not really an alternative to trimosan. She is also using vaginal estrogen cream already. Can you let the patient know we can try another pharmacy, or do we have an extra tube to give her from the office?

## 2020-10-30 NOTE — Telephone Encounter (Signed)
-----   Message from Salvadore Dom, MD sent at 10/29/2020  5:57 PM EST ----- Please let the patient know that her UTI is sensitive to the antibiotic that Dr Dellis Filbert prescribed and see if she is feeling better.

## 2020-11-01 NOTE — Patient Instructions (Signed)
Goals Addressed      Other   .  Depression - Symptoms Monitored and Managed   On track     Timeframe:  Long-Range Goal Priority:  Medium Start Date:  10/29/20                          Expected End Date: 05/01/21          Next Follow Up Date: 11/27/20              . Self administer medications as prescribed . Attend all scheduled provider appointments . Call pharmacy for medication refills . Call provider office for new concerns or questions . Follow up with [facility] if needed . Continue to use relaxation and prayer to help with effective coping . Use distraction and other family/friend support to help work through grief    .  Impaired Urinary Elimination - infection prevented or minimized   On track     Timeframe:  Long-Range Goal Priority:  High Start Date: 10/29/20                            Expected End Date: 05/01/21       Next Follow Up Date: 11/27/20  . Self administer medications as prescribed . Attend all scheduled provider appointments . Call pharmacy for medication refills . Call provider office for new concerns or questions . Increase water intake to 64 oz daily unless otherwise directed . Complete full course of antibiotics as directed

## 2020-11-01 NOTE — Chronic Care Management (AMB) (Signed)
Chronic Care Management   CCM RN Visit Note  11/01/2020 Name: Abigail [name] MRN: [id number] DOB: Jul 17, 1936  Subjective: Abigail [name] is a 85 y.o. year old female who is a primary care patient of [name], [place]. The care management team was consulted for assistance with disease management and care coordination needs.    Engaged with patient by telephone for follow up visit in response to provider referral for case management and/or care coordination services.   Consent to Services:  The patient was given information about Chronic Care Management services, agreed to services, and gave verbal consent prior to initiation of services.  Please see initial visit note for detailed documentation.   Patient agreed to services and verbal consent obtained.   Assessment: Review of patient past medical history, allergies, medications, health status, including review of consultants reports, laboratory and other test data, was performed as part of comprehensive evaluation and provision of chronic care management services.   SDOH (Social Determinants of Health) assessments and interventions performed:  Yes, no new challenges identified   CCM Care Plan  Allergies  Allergen Reactions  . Codeine Nausea And Vomiting  . Norvasc [Amlodipine] Rash and Other (See Comments)    [place]    Outpatient Encounter Medications as of 10/29/2020  Medication Sig  . aspirin EC 81 MG tablet Take 81 mg by mouth daily.  [name] Kitchen atorvastatin (LIPITOR) 80 MG tablet Take 1 tablet (80 mg total) by mouth daily at 6 PM.  . ciprofloxacin (CIPRO) 250 MG tablet Take 1 tablet (250 mg total) by mouth 2 (two) times daily.  . Cranberry 1000 MG CAPS Take 1 capsule by mouth daily at 12 noon.  . docusate sodium (COLACE) 100 MG capsule Take 200-300 mg by mouth daily as needed for mild constipation.   [name] Kitchen estradiol (ESTRACE) 0.1 MG/GM vaginal cream Place 0.5 g vaginally 2 (two) times a week. Place 0.5g twice a week at opening of vagina  .  levothyroxine (SYNTHROID) 175 MCG tablet TAKE 1 TABLET BY MOUTH EVERY MORNING ON MONDAY - FRIDAY  . lisinopril-hydrochlorothiazide (ZESTORETIC) 20-25 MG tablet Take 0.5 tablets by mouth 3 (three) times a week. Take on Monday Wednesday and Friday only  . nitroGLYCERIN (NITROSTAT) 0.4 MG SL tablet Place 1 tablet (0.4 mg total) under the tongue every 5 (five) minutes x 3 doses as needed for chest pain.  Levin Erp SULFATE VAGINAL (TRIMO-SAN) 0.025 % GEL Place 1 application vaginally as needed.  . sulfamethoxazole-trimethoprim (BACTRIM) 400-80 MG tablet Take 1 tablet by mouth daily. Start medication after your course of ciprofloxacin is complete.  . ticagrelor (BRILINTA) 90 MG TABS tablet Take 1 tablet (90 mg total) by mouth 2 (two) times daily.  . Trospium Chloride 60 MG CP24 Take 1 capsule (60 mg total) by mouth daily.   Facility-Administered Encounter Medications as of 10/29/2020  Medication  . Ring Pessary/Support MISC 1 each    Patient Active Problem List   Diagnosis Date Noted  . Chest pain with moderate risk for cardiac etiology 12/01/2019  . Urinary urgency 12/01/2019  . STEMI involving right coronary artery ([place]) 10/27/2019  . STEMI (ST elevation myocardial infarction) ([place]) 10/26/2019  . Anxiety 02/09/2019  . Prediabetes 12/29/2018  . Essential hypertension 08/27/2018  . Vitamin D deficiency 06/25/2018  . Hyperlipidemia 06/25/2018  . Hypothyroidism 06/25/2018  . Benign hypertension with CKD (chronic kidney disease) stage III ([place]) 06/25/2018  . Chronic kidney disease, stage III (moderate) ([place]) 06/25/2018  . OAB (overactive bladder) 08/26/2016  .  Complete uterine prolapse 06/21/2015  . CAP (community acquired pneumonia) 04/12/2012  . Bacteremia 04/08/2012  . Urinary tract infection without hematuria 04/05/2012  . LLQ abdominal pain 04/05/2012  . Hyponatremia 04/05/2012    Conditions to be addressed/monitored:Acquired hypothyroidism, Anxiety, Essential hypertension,  Prediabetes, Hyperlipidemia  Care Plan : Depression (Adult)  Updates made by Lynne Logan, RN since 11/01/2020 12:00 AM    Problem: Symptoms (Depression)   Priority: Medium    Long-Range Goal: Symptoms Monitored and Managed   Start Date: 10/29/2020  Expected End Date: 05/01/2021  This Visit's Progress: On track  Priority: Medium  Note:   CARE PLAN ENTRY (see longitudinal plan of care for additional care plan information)  Current Barriers:  [name] Kitchen Knowledge Deficits related to how to obtain grievance counseling for recent loss of family members  . Chronic Disease Management support and education needs related to Acquired hypothyroidism, Anxiety, Essential hypertension, Prediabetes, Hyperlipidemia . Recent loss of sister and brother in law Nurse Case Manager Clinical Goal(s):  [name] Kitchen Over the next 180 days, patient will verbalize increased effective coping to help her cope with loss of sister and brother in law as evidence patient will report feeling moe hopeful with less depression and sadness related to recent family loss CCM RN CM Interventions:  10/29/20 call completed with patient  . Inter-disciplinary care team collaboration (see longitudinal plan of care) . Evaluation of current treatment plan related to grief counseling and patient's adherence to plan as established by provider . Determined patient recently lost her sister and her brother in law and in addition her nephew's wife who was very young has also passed away, all losses have occurred in the past 2 months . Provided active listening to patient and emphasized  my condolences for her loss  . Determined patient is grieving over this loss but declines to receive grievance counseling at this time; Determined patient plans to resume working part time in order to keep herself busy and use distraction to help her grieve . Provided patient with the contact number for [facility] . Educated patient on how to access this service for  grief counseling if needed  . Discussed plans with patient for ongoing care management follow up and provided patient with direct contact information for care management team Patient Self Care Activities:  . Self administer medications as prescribed . Attend all scheduled provider appointments . Call pharmacy for medication refills . Call provider office for new concerns or questions . Follow up with [facility] if needed . Continue to use relaxation and prayer to help with effective coping . Use distraction and other family/friend support to help work through grief Next Follow Up Date: 11/27/20   Care Plan : Chronic Impaired Urinary Elimination  Updates made by Lynne Logan, RN since 11/01/2020 12:00 AM    Problem: Impaired Urinary Elimination   Priority: High    Long-Range Goal: Impaired Urinary Elimination - infection prevented or minimized   Start Date: 10/29/2020  Expected End Date: 05/01/2021  This Visit's Progress: On track  Priority: High  Note:   CARE PLAN ENTRY (see longitudinal plan of care for additional care plan information)  Current Barriers:  [name] Kitchen Knowledge Deficits related to ways to improve and maintain Urinary Tract Health  . Chronic Disease Management support and education needs related to Acquired hypothyroidism, Anxiety, Essential hypertension, Prediabetes, Hyperlipidemia Nurse Case Manager Clinical Goal(s):  [name] Kitchen Over the next 180 days, patient will work with the CCM team and  PCP to address needs related to diagnosis and treatment for Cystitis and recurrent UTI CCM RN CM Interventions:  10/29/20 call completed with patient  . Inter-disciplinary care team collaboration (see longitudinal plan of care) . Evaluation of current treatment plan related to Cystitis and recurrent UTI and patient's adherence to plan as established by provider . Determined patient completed a consultation with Dr. [name], MD with the following Plan/Assessment reviewed:   [name] Kitchen ASSESSMENT AND PLAN . Ms. Pearcy is a 85 y.o. with:  . 1. . Overactive bladder  .  2. . Recurrent UTI  .  3. . Stress incontinence  .  4. . Vaginal atrophy    . 1. OAB . We discussed the symptoms of overactive bladder (OAB), which include urinary urgency, urinary frequency, nocturia, with or without urge incontinence.  While we do not know the exact etiology of OAB, several treatment options exist. We discussed management including behavioral therapy (decreasing bladder irritants, urge suppression strategies, timed voids, bladder retraining), physical therapy, medication. . For anticholinergic medications, we discussed the potential side effects of anticholinergics including dry eyes, dry mouth, constipation, cognitive impairment and urinary retention. . For Beta-3 agonist medication, we discussed the potential side effect of elevated blood pressure which is more likely to occur in individuals with uncontrolled hypertension. She has had labile blood pressures so would not recommend Myrbetriq at this time.  . - We discussed that oxybutynin has risk of cognitive dysfunction and not recommended for women over 70. Therefore we will discontinue and start Trospium 60mg  daily, which does not cross the blood brain barrier.  . 2. Recurrent UTI . - disgnosed with UTI 2 days ago, and is starting Ciprofloxacin for treatment . - For treatment of recurrent urinary tract infections, we discussed management of recurrent UTIs including prophylaxis with a daily low dose antibiotic, transvaginal estrogen therapy, D-mannose, and cranberry supplements.  She is currently taking cranberry.  . - Will start estrace 0.5g twice a week. Also will prescribe daily bactrim for prophylaxis for 6 months.  . 3. Stress incontinence . -For treatment of stress urinary incontinence,  non-surgical options include expectant management, weight loss, physical therapy, as well as a pessary.  Surgical options include a midurethral sling,  Burch urethropexy, and transurethral injection of a bulking agent. . - She prefers to start with physical therapy, and referral was placed.  . 4. Vaginal atrophy . - Start estrace 0.5g twice weekly. In between she can use the trimosan gel as needed.  . Was unable to assess prolapse today because patient declined pessary removal. We discussed that she may not be the best surgical candidate since she had an MI last year and recently has been having some cardiac symptoms. Recommended she follow up with her PCP and Cardiologist for further management, she will call and make an appt.  . Stop Oxybutynin and start Trospium instead for overactive bladder symptoms. . Once you complete the ciprofloxacin, start Bactrim once a day for six months.  . We placed a referral for pelvic floor physical therapy for your leakage of urine.  . For treatment of stress urinary incontinence, which is leakage with physical activity/movement/strainging/coughing, we discussed expectant management versus nonsurgical options versus surgery. Nonsurgical options include weight loss, physical therapy, as well as a pessary or  transurethral injection of a bulking agent.  . Encouraged patient to call her Dr. Wannetta Sender for questions or concerns or ongoing symptoms suggestive of UTI . Reviewed signs/symptoms of UTI and when to call the  doctor if needed . Educated on importance of increasing daily water intake to 64 oz daily unless otherwise directed  . Discussed plans with patient for ongoing care management follow up and provided patient with direct contact information for care management team Patient Self Care Activities:  . Self administer medications as prescribed . Attend all scheduled provider appointments . Call pharmacy for medication refills . Call provider office for new concerns or questions . Increase water intake to 64 oz daily unless otherwise directed . Complete full course of antibiotics as directed  Next Follow Up  Call: 11/27/20    Plan:Telephone follow up appointment with care management team member scheduled for:  11/27/20  Barb Merino, RN, BSN, CCM Care Management Coordinator [facility]  Direct Phone: [phone]

## 2020-11-06 ENCOUNTER — Other Ambulatory Visit: Payer: Self-pay

## 2020-11-06 ENCOUNTER — Encounter: Payer: Self-pay | Admitting: Physical Therapy

## 2020-11-06 ENCOUNTER — Encounter: Payer: PPO | Attending: Obstetrics and Gynecology | Admitting: Physical Therapy

## 2020-11-06 ENCOUNTER — Telehealth: Payer: Self-pay | Admitting: Interventional Cardiology

## 2020-11-06 DIAGNOSIS — N814 Uterovaginal prolapse, unspecified: Secondary | ICD-10-CM | POA: Diagnosis not present

## 2020-11-06 DIAGNOSIS — R278 Other lack of coordination: Secondary | ICD-10-CM | POA: Insufficient documentation

## 2020-11-06 DIAGNOSIS — D500 Iron deficiency anemia secondary to blood loss (chronic): Secondary | ICD-10-CM | POA: Diagnosis not present

## 2020-11-06 DIAGNOSIS — M6281 Muscle weakness (generalized): Secondary | ICD-10-CM | POA: Insufficient documentation

## 2020-11-06 DIAGNOSIS — N393 Stress incontinence (female) (male): Secondary | ICD-10-CM

## 2020-11-06 DIAGNOSIS — D62 Acute posthemorrhagic anemia: Secondary | ICD-10-CM | POA: Diagnosis not present

## 2020-11-06 NOTE — Therapy (Addendum)
[place] at [facility] [address] [place], [place], [address] Phone: [phone]   Fax:  [phone]  Physical Therapy Evaluation  Patient Details  Name: Abigail [name] MRN: [id number] Date of Birth: 1936/04/06 Referring Provider (PT): Dr. [name]   Encounter Date: 11/06/2020   PT End of Session - 11/06/20 1123    Visit Number 1    Date for PT Re-Evaluation 01/29/21    Authorization Type [employer]    Authorization - Visit Number 1    Authorization - Number of Visits 10    PT Start Time 0930    PT Stop Time 1030    PT Time Calculation (min) 60 min    Activity Tolerance Patient tolerated treatment well;Patient limited by fatigue    Behavior During Therapy [facility] for tasks assessed/performed           Past Medical History:  Diagnosis Date   Carotid artery occlusion    Complication of anesthesia    hard to wake up per pt   Diverticulitis    Hypertension    Hypothyroid    Intrauterine pessary    STEMI (ST elevation myocardial infarction) ([place]) 10/26/2019   DES RCA    Past Surgical History:  Procedure Laterality Date   [place]   CORONARY/GRAFT ACUTE MI REVASCULARIZATION N/A 10/26/2019   Procedure: Coronary/Graft Acute MI Revascularization;  Surgeon: Sherren Mocha, MD;  Location: [place] [facility];  Service: Cardiovascular;  Laterality: N/A;   ELBOW SURGERY     left   FRACTURE SURGERY     LEFT HEART CATH AND CORONARY ANGIOGRAPHY N/A 10/26/2019   Procedure: LEFT HEART CATH AND CORONARY ANGIOGRAPHY;  Surgeon: Sherren Mocha, MD;  Location: [place] [facility];  Service: Cardiovascular;  Laterality: N/A;   SPINE SURGERY      There were no vitals filed for this visit.    Subjective Assessment - 11/06/20 0941    Subjective Urinary leakage has been for many years. I have a pessary and  feels it started the leakage. Patient has had repeatitive UTIs making her deconditioned.  Has had several pessaries and this one is the only one that stays in. Dr. Forde Dandy put the pessaries in .    Patient Stated Goals reduce leakage    Currently in Pain? No/denies    Multiple Pain Sites No              [facility] PT Assessment - 11/06/20 0001      Assessment   Medical Diagnosis N32.81 Overactive bladder; N39.3 Stress incontinence    Referring Provider (PT) Dr. [name]    Onset Date/Surgical Date --   chronic     Precautions   Precautions Other (comment)    Precaution Comments heart issues, pasat back surgery      Restrictions   Weight Bearing Restrictions No      Balance Screen   Has the patient fallen in the past 6 months No    Has the patient had a decrease in activity level because of a fear of falling?  No    Is the patient reluctant to leave their home because of a fear of falling?  No      Home Environment   Living Environment Private residence      Prior Function   Level of Independence Independent    Leisure used to workout at the gym but not in the past year due to weakness from  the UTI      Cognition   Overall Cognitive Status Within Functional Limits for tasks assessed      Posture/Postural Control   Posture/Postural Control Postural limitations    Postural Limitations Flexed trunk      ROM / Strength   AROM / PROM / Strength AROM;PROM;Strength      PROM   Right Hip External Rotation  35    Left Hip External Rotation  33      Strength   Right Hip Flexion 4/5    Right Hip Extension 4/5    Right Hip ABduction 4/5    Right Hip ADduction 4/5    Left Hip Flexion 4/5    Left Hip Extension 4/5    Left Hip ABduction 4/5    Left Hip ADduction 4/5                      Objective measurements completed on examination: See above findings.     Pelvic Floor Special Questions - 11/06/20 0001    Urinary Leakage Yes    Pad use 1 pad per day    Activities that cause leaking With strong urge   in the morning only  will leak  on the way to the bathroom   Urinary frequency when has the urge to go to the bathroom will go but no leakage.    Fecal incontinence No   takes 3 stool softners   Skin Integrity Intact;Other   dryness   Prolapse Other   has pessary inserted   Pelvic Floor Internal Exam Patient confirms identification and approves PT to assess pelvic floor and treatment    Exam Type Vaginal    Palpation felt the pessary    Strength Flicker                    PT Education - 11/06/20 1035    Education Details Access Code: [email or web address]    Person(s) Educated Patient    Methods Explanation;Demonstration;Verbal cues;Handout    Comprehension Returned demonstration;Verbalized understanding            PT Short Term Goals - 11/06/20 1132      PT SHORT TERM GOAL #1   Title independent with initial HEP    Time 4    Period Weeks    Status New    Target Date 12/04/20      PT SHORT TERM GOAL #2   Title understand how to use the vaginal creams the doctor gave her to improve vaginal tissue health    Time 4    Period Weeks    Status New    Target Date 12/04/20             PT Long Term Goals - 11/06/20 1133      PT LONG TERM GOAL #1   Title indepedent with advanced HEP for pelvic floor strengthening    Time 12    Period Weeks    Status New    Target Date 01/29/21      PT LONG TERM GOAL #2   Title pelvic floor strength is >/= 3/5 so she is able to walk to the bathroom without leaking urine    Time 12    Period Weeks    Status New    Target Date 01/29/21      PT LONG TERM GOAL #3   Title able to walk for 5-10 minutes 1-2 times per day to increase her endurance  and ability to walk to the bathroom in a timely manner to reduce chance of urinary leakage    Time 12    Period Weeks    Status New    Target Date 01/29/21                  Plan - 11/06/20 1035    Clinical Impression Statement Patient is a 85 year old female with overactive bladder and stress incontinence. Patient  willleak in the morning when she has the urge to urinate and walks to the bathroom. Patient wears 1 pad per day. Patient does not know if she leaks any other time of the day. She reports increased endurance and fatique in the past year. Patient used to go to the gym but has not since last June when she started to have recurrent UTI's. Patient has finished her last dose of CIPRO last week. Patient presently had a pessary in that is felt on palpation. Pelvic floor strength is 1/5 with compensation of the gluteals and hip adductors. Patient hip strength is 4/5. She stands with a flexed posture, decreased heel strike, and walks very slowly. Patient reports she will see her cardiac doctor in May. Patient will benefit from skilled therapy to improve her pelvic strength and overall endurance so she is able to walk to the bathroom without leaking urine.    Personal Factors and Comorbidities Age;Comorbidity 3+;Time since onset of injury/illness/exacerbation;Fitness    Comorbidities Cardiac issues; Hypothyroid; back surgery 1980; intrauterine pessary    Examination-Activity Limitations Continence;Locomotion Level;Stand    Examination-Participation Restrictions Cleaning;Meal Prep;Community Activity    Stability/Clinical Decision Making Evolving/Moderate complexity    Clinical Decision Making Moderate    Rehab Potential Good    PT Frequency 1x / week    PT Duration 12 weeks    PT Treatment/Interventions ADLs/Self Care Home Management;Biofeedback;Electrical Stimulation;Therapeutic activities;Therapeutic exercise;Neuromuscular re-education;Manual techniques;Patient/family education    PT Next Visit Plan see how she is doing with her vaginal creams; pelvic floor contraction with pushing hand into knees; hip stretches; go over bladder irritants; see if she made appointment with cardiac doctor    PT Home Exercise Plan Access Code: [email or web address]    Consulted and Agree with Plan of Care Patient           Patient will  benefit from skilled therapeutic intervention in order to improve the following deficits and impairments:  Decreased coordination,Decreased endurance,Decreased activity tolerance,Decreased strength  Visit Diagnosis: Muscle weakness (generalized) - Plan: PT plan of care cert/re-cert  Other lack of coordination - Plan: PT plan of care cert/re-cert  Stress incontinence (female) (female) - Plan: PT plan of care cert/re-cert     Problem List Patient Active Problem List   Diagnosis Date Noted   Chest pain with moderate risk for cardiac etiology 12/01/2019   Urinary urgency 12/01/2019   STEMI involving right coronary artery ([place]) 10/27/2019   STEMI (ST elevation myocardial infarction) ([place]) 10/26/2019   Anxiety 02/09/2019   Prediabetes 12/29/2018   Essential hypertension 08/27/2018   Vitamin D deficiency 06/25/2018   Hyperlipidemia 06/25/2018   Hypothyroidism 06/25/2018   Benign hypertension with CKD (chronic kidney disease) stage III ([place]) 06/25/2018   Chronic kidney disease, stage III (moderate) ([place]) 06/25/2018   OAB (overactive bladder) 08/26/2016   Complete uterine prolapse 06/21/2015   CAP (community acquired pneumonia) 04/12/2012   Bacteremia 04/08/2012   Urinary tract infection without hematuria 04/05/2012   LLQ abdominal pain 04/05/2012   Hyponatremia 04/05/2012    Earlie Counts, PT  11/06/20 11:37 AM   [place] at [facility] [address], [place], [place], [address] Phone: [phone]   Fax:  [phone]  Name: LYNDIA BURY MRN: [id number] Date of Birth: 08/01/1936 PHYSICAL THERAPY DISCHARGE SUMMARY Visits from Start of Care: 1  Current functional level related to goals / functional outcomes: Patient did not return to therapy. She had surgery by Dr. Sherlene Shams to fix her prolapse.    Remaining deficits: See above.    Education / Equipment: HEP Plan:                                                     Patient goals were not met. Patient is being discharged due to a change in medical status. Thank you for the referral. Earlie Counts, PT 01/29/21 2:56 PM   ?????

## 2020-11-06 NOTE — Patient Instructions (Signed)
Access Code: [email or web address] URL: [email or web address] Date: 11/06/2020 Prepared by: Earlie Counts  Program Notes 3 times per day walk 3 minutes in your house or outside   Exercises Supine Hip Adduction Isometric with Ball - 2 x daily - 7 x weekly - 1 sets - 10 reps - 5 sec hold Earlie Counts, PT [facility] [address], [place], [place] [address] W: [phone] [email or web address]

## 2020-11-06 NOTE — Telephone Encounter (Signed)
I spoke with patient.  She reports constant UTI's since June.  Has been feeling weak with this. Also has been having palpitations.  Reports shortness of breath when walking out in her yard.  Some right sided and left sided chest pressure at times.  Also with pain in her legs and hips. Has used NTG on occasion.  Patient reports this shortness of breath, weakness and chest pressure has been a change since she saw Dr Irish Lack in November.  I scheduled patient to see Dr Irish Lack on March 21,2022 at 2:40. Patient aware to go to ED if worsening of symptoms prior to this appointment.

## 2020-11-06 NOTE — Telephone Encounter (Signed)
Patient c/o Palpitations:  High priority if patient c/o lightheadedness, shortness of breath, or chest pain  1) How long have you had palpitations/irregular HR/ Afib? Are you having the symptoms now?  last summer, no  2) Are you currently experiencing lightheadedness, SOB or CP? had chest pain at one time, but is not currently having symptoms  3) Do you have a history of afib (atrial fibrillation) or irregular heart rhythm? no  4) Have you checked your BP or HR? (document readings if available): 127/53 HR 77  5) Are you experiencing any other symptoms?   Patient states since June of last year she has been having UTI's She states she had to quit work in January, because of them. She states She states she has been very weak. She states just walking to another room makes her legs and hips burn, and she will have palpitations. She states She has had chest pain at one time on exertion. She states she is not currently having chest pain or palpitations. She states she also has mucus come up her throat and when she tries to cough it up it takes her breath away. She states her physical therapist told her to walk 3 minutes 3 times a day.

## 2020-11-07 NOTE — Telephone Encounter (Signed)
Spoke with the patient who states that she is feeling alright today. Offered her an appointment tomorrow with Dr. Irish Lack. Patient agreeable to appointment tomorrow and is aware of ED precautions if symptoms worsen before then.

## 2020-11-07 NOTE — Progress Notes (Signed)
Cardiology Office Note   Date:  11/08/2020   ID:  Abigail [name], DOB March 31, 1936, MRN [id number]  PCP:  [name], FNP    No chief complaint on file.  CAD  Wt Readings from Last 3 Encounters:  11/08/20 155 lb (70.3 kg)  10/26/20 156 lb (70.8 kg)  10/09/20 150 lb (68 kg)       History of Present Illness: Abigail [name] is a 85 y.o. female  who has had presyncope, hyponatremia in the past.I saw her in 10/2018 for these issues.  In March 2021, she presented with an ST elevation MI.  Cath report showed:  "A drug-eluting stent was successfully placed using a SYNERGY XD 2.75X24.  Post intervention, there is a 0% residual stenosis.  1. Severe single-vessel coronary artery disease with a proximal RCA culprit lesion, treated successfully with primary PCI using a 2.75 x 24 mm Synergy DES 2. Mild diffuse nonobstructive disease involving the LAD and left circumflex without any significant stenoses 3. Normal LVEDP 4. Moderately severe diffuse residual stenosis in the mid and distal RCA, favor initial medical therapy."  Echo 2021: "Left ventricular ejection fraction, by estimation, is 60 to 65%. The  left ventricle has normal function. The left ventricle has no regional  wall motion abnormalities. Left ventricular diastolic parameters were  normal.  2. Right ventricular systolic function is normal. The right ventricular  size is normal.  3. Left atrial size was mildly dilated.  4. The mitral valve is normal in structure and function. No evidence of  mitral valve regurgitation. No evidence of mitral stenosis.  5. The aortic valve is normal in structure and function. Aortic valve  regurgitation is not visualized. No aortic stenosis is present. "  Walking was limited due to GYN issues.    She called the office in 3/22 : "She reports constant UTI's since June.  Has been feeling weak with this. Also has been having palpitations.  Reports shortness of breath when  walking out in her yard.  Some right sided and left sided chest pressure at times.  Also with pain in her legs and hips. Has used NTG on occasion.  Patient reports this shortness of breath, weakness and chest pressure has been a change since she saw Dr Irish Lack in November."  Her endurance has decreased over the past few months and she has had many UTIs in the past. She has had GYN issues. Recently describing SHOB and chest pressure with walking to the mailbox.  She used SL NTG with some relief.    Past Medical History:  Diagnosis Date  . Carotid artery occlusion   . Complication of anesthesia    hard to wake up per pt  . Diverticulitis   . Hypertension   . Hypothyroid   . Intrauterine pessary   . STEMI (ST elevation myocardial infarction) ([facility]) 10/26/2019   DES RCA    Past Surgical History:  Procedure Laterality Date  . BACK SURGERY  1980  . CORONARY/GRAFT ACUTE MI REVASCULARIZATION N/A 10/26/2019   Procedure: Coronary/Graft Acute MI Revascularization;  Surgeon: Sherren Mocha, MD;  Location: [facility];  Service: Cardiovascular;  Laterality: N/A;  . ELBOW SURGERY     left  . FRACTURE SURGERY    . LEFT HEART CATH AND CORONARY ANGIOGRAPHY N/A 10/26/2019   Procedure: LEFT HEART CATH AND CORONARY ANGIOGRAPHY;  Surgeon: Sherren Mocha, MD;  Location: [facility];  Service: Cardiovascular;  Laterality: N/A;  . SPINE SURGERY  Current Outpatient Medications  Medication Sig Dispense Refill  . aspirin EC 81 MG tablet Take 81 mg by mouth daily.    [name] Kitchen atorvastatin (LIPITOR) 80 MG tablet Take 1 tablet (80 mg total) by mouth daily at 6 PM. 90 tablet 3  . Cranberry 1000 MG CAPS Take 1 capsule by mouth daily at 12 noon.    . docusate sodium (COLACE) 100 MG capsule Take 200-300 mg by mouth daily as needed for mild constipation.     [name] Kitchen estradiol (ESTRACE) 0.1 MG/GM vaginal cream Place 0.5 g vaginally 2 (two) times a week. Place 0.5g twice a week at opening of vagina 30 g 11  .  levothyroxine (SYNTHROID) 175 MCG tablet TAKE 1 TABLET BY MOUTH EVERY MORNING ON MONDAY - FRIDAY 90 tablet 1  . lisinopril-hydrochlorothiazide (ZESTORETIC) 20-25 MG tablet Take 0.5 tablets by mouth 3 (three) times a week. Take on Monday Wednesday and Friday only 90 tablet 1  . nitroGLYCERIN (NITROSTAT) 0.4 MG SL tablet Place 1 tablet (0.4 mg total) under the tongue every 5 (five) minutes x 3 doses as needed for chest pain. 25 tablet 3  . OXYQUINOLONE SULFATE VAGINAL (TRIMO-SAN) 0.025 % GEL Place 1 application vaginally as needed. 30 g 5  . sulfamethoxazole-trimethoprim (BACTRIM) 400-80 MG tablet Take 1 tablet by mouth daily. Start medication after your course of ciprofloxacin is complete. 15 tablet 5  . ticagrelor (BRILINTA) 90 MG TABS tablet Take 1 tablet (90 mg total) by mouth 2 (two) times daily. 180 tablet 3  . Trospium Chloride 60 MG CP24 Take 1 capsule (60 mg total) by mouth daily. 30 capsule 5   Current Facility-Administered Medications  Medication Dose Route Frequency Provider Last Rate Last Admin  . Ring Pessary/Support MISC 1 each  1 each Does not apply Once Anyanwu, Ugonna A, MD        Allergies:   Codeine and Norvasc [amlodipine]    Social History:  The patient  reports that she quit smoking about 34 years ago. She has never used smokeless tobacco. She reports that she does not drink alcohol and does not use drugs.   Family History:  The patient's family history includes Hypertension in her mother.    ROS:  Please see the history of present illness.   Otherwise, review of systems are positive for DOE, chest pressure.   All other systems are reviewed and negative.    PHYSICAL EXAM: VS:  BP (!) 100/52   Pulse 85   Ht 5\' 3"  (1.6 m)   Wt 155 lb (70.3 kg)   SpO2 97%   BMI 27.46 kg/m  , BMI Body mass index is 27.46 kg/m. GEN: Well nourished, well developed, in no acute distress  HEENT: normal  Neck: no JVD, carotid bruits, or masses Cardiac: RRR; no murmurs, rubs, or  gallops,no edema  Respiratory:  clear to auscultation bilaterally, normal work of breathing GI: soft, nontender, nondistended, + BS MS: no deformity or atrophy  Skin: warm and dry, no rash Neuro:  Strength and sensation are intact Psych: euthymic mood, full affect   EKG:   The ekg ordered today demonstrates NSR, diffuse ST canges, no significant change from prior   Recent Labs: 12/01/2019: B Natriuretic Peptide 145.3 01/11/2020: Hemoglobin 10.2; Platelets 336 05/16/2020: ALT 19 10/03/2020: BUN 20; Creatinine, Ser 1.31; Potassium 5.0; Sodium 134; TSH 0.528   Lipid Panel    Component Value Date/Time   CHOL 142 05/16/2020 0913   TRIG 122 05/16/2020 0913   HDL 51  05/16/2020 0913   CHOLHDL 2.8 05/16/2020 0913   CHOLHDL 4.8 10/26/2019 2246   VLDL 40 10/26/2019 2246   LDLCALC 69 05/16/2020 0913     Other studies Reviewed: Additional studies/ records that were reviewed today with results demonstrating: Cath films personally reviewed.  Moderate disease in the mid and distal vessel.  Guide support may be an issue.  Consider RCB guide catheter if intervention is needed.   ASSESSMENT AND PLAN:  1. CAD/Old MI: Prior PCI to RCA.  Known residual disease.  Recurent angina.  Definite change from June 2021, when she was very active and working out in [employer].  She thinks that her frequent UTIs have been an issue.  Brilinta for antiplatelet therapy.  2. Carotid artery disease: Minimal plaque on CTA in 11/19.  She has had lifeline screening per her report a few months ago and ewverything was ok.  3. HTN: The current medical regimen is effective;  continue present plan and medications.  Will not add further antianginal therapy due to borderline low blood pressure 4. Hyperlipidemia: Continue atorvastatin. 5. If she does require PCI, could consider GYN surgery at the earliest 3 months after PCI.    Current medicines are reviewed at length with the patient today.  The patient concerns regarding her  medicines were addressed.  The following changes have been made:  No change  Labs/ tests ordered today include: Precath labs  Orders Placed This Encounter  Procedures  . CBC  . Basic Metabolic Panel (BMET)  . EKG 12-Lead    Recommend 150 minutes/week of aerobic exercise Low fat, low carb, high fiber diet recommended  Disposition:   FU post cath   Signed, Larae Grooms, MD  11/08/2020 10:59 AM    [address], [place], [place]  [address] Phone: [phone]; Fax: [phone]    Addendum: Hbg was 5.3 on precath labs.  Patient was sent to ER and GI eval was done.   Jettie Booze, MD

## 2020-11-07 NOTE — Telephone Encounter (Signed)
I can see her tomorrow as at the end of my quarter day also.  May need to consider repeat cath given her residual RCA disease.

## 2020-11-08 ENCOUNTER — Other Ambulatory Visit: Payer: Self-pay

## 2020-11-08 ENCOUNTER — Encounter (HOSPITAL_COMMUNITY): Payer: Self-pay

## 2020-11-08 ENCOUNTER — Telehealth: Payer: Self-pay | Admitting: Cardiology

## 2020-11-08 ENCOUNTER — Encounter: Payer: Self-pay | Admitting: Interventional Cardiology

## 2020-11-08 ENCOUNTER — Ambulatory Visit: Payer: PPO | Admitting: Interventional Cardiology

## 2020-11-08 ENCOUNTER — Telehealth: Payer: Self-pay | Admitting: Physical Therapy

## 2020-11-08 ENCOUNTER — Inpatient Hospital Stay (HOSPITAL_COMMUNITY)
Admission: EM | Admit: 2020-11-08 | Discharge: 2020-11-10 | DRG: 760 | Disposition: A | Discharge status: Home / Self Care | Payer: PPO | Attending: Family Medicine | Admitting: Family Medicine

## 2020-11-08 VITALS — BP 100/52 | HR 85 | Ht 63.0 in | Wt 155.0 lb

## 2020-11-08 DIAGNOSIS — I25118 Atherosclerotic heart disease of native coronary artery with other forms of angina pectoris: Secondary | ICD-10-CM

## 2020-11-08 DIAGNOSIS — Z20822 Contact with and (suspected) exposure to covid-19: Secondary | ICD-10-CM | POA: Diagnosis present

## 2020-11-08 DIAGNOSIS — R3915 Urgency of urination: Secondary | ICD-10-CM | POA: Diagnosis present

## 2020-11-08 DIAGNOSIS — I6523 Occlusion and stenosis of bilateral carotid arteries: Secondary | ICD-10-CM

## 2020-11-08 DIAGNOSIS — Z8249 Family history of ischemic heart disease and other diseases of the circulatory system: Secondary | ICD-10-CM

## 2020-11-08 DIAGNOSIS — I129 Hypertensive chronic kidney disease with stage 1 through stage 4 chronic kidney disease, or unspecified chronic kidney disease: Secondary | ICD-10-CM | POA: Diagnosis present

## 2020-11-08 DIAGNOSIS — K5900 Constipation, unspecified: Secondary | ICD-10-CM | POA: Diagnosis present

## 2020-11-08 DIAGNOSIS — N183 Chronic kidney disease, stage 3 unspecified: Secondary | ICD-10-CM | POA: Diagnosis present

## 2020-11-08 DIAGNOSIS — I5033 Acute on chronic diastolic (congestive) heart failure: Secondary | ICD-10-CM | POA: Diagnosis present

## 2020-11-08 DIAGNOSIS — I2510 Atherosclerotic heart disease of native coronary artery without angina pectoris: Secondary | ICD-10-CM | POA: Diagnosis present

## 2020-11-08 DIAGNOSIS — R002 Palpitations: Secondary | ICD-10-CM | POA: Diagnosis present

## 2020-11-08 DIAGNOSIS — D62 Acute posthemorrhagic anemia: Secondary | ICD-10-CM | POA: Diagnosis present

## 2020-11-08 DIAGNOSIS — I130 Hypertensive heart and chronic kidney disease with heart failure and stage 1 through stage 4 chronic kidney disease, or unspecified chronic kidney disease: Secondary | ICD-10-CM | POA: Diagnosis present

## 2020-11-08 DIAGNOSIS — I10 Essential (primary) hypertension: Secondary | ICD-10-CM

## 2020-11-08 DIAGNOSIS — N814 Uterovaginal prolapse, unspecified: Principal | ICD-10-CM | POA: Diagnosis present

## 2020-11-08 DIAGNOSIS — R102 Pelvic and perineal pain: Secondary | ICD-10-CM | POA: Diagnosis present

## 2020-11-08 DIAGNOSIS — Z79899 Other long term (current) drug therapy: Secondary | ICD-10-CM

## 2020-11-08 DIAGNOSIS — Z87891 Personal history of nicotine dependence: Secondary | ICD-10-CM

## 2020-11-08 DIAGNOSIS — R195 Other fecal abnormalities: Secondary | ICD-10-CM | POA: Diagnosis present

## 2020-11-08 DIAGNOSIS — R531 Weakness: Secondary | ICD-10-CM | POA: Diagnosis present

## 2020-11-08 DIAGNOSIS — Z7982 Long term (current) use of aspirin: Secondary | ICD-10-CM

## 2020-11-08 DIAGNOSIS — K5792 Diverticulitis of intestine, part unspecified, without perforation or abscess without bleeding: Secondary | ICD-10-CM | POA: Diagnosis present

## 2020-11-08 DIAGNOSIS — Z87440 Personal history of urinary (tract) infections: Secondary | ICD-10-CM

## 2020-11-08 DIAGNOSIS — Z960 Presence of urogenital implants: Secondary | ICD-10-CM | POA: Diagnosis present

## 2020-11-08 DIAGNOSIS — D500 Iron deficiency anemia secondary to blood loss (chronic): Secondary | ICD-10-CM | POA: Diagnosis not present

## 2020-11-08 DIAGNOSIS — Z888 Allergy status to other drugs, medicaments and biological substances status: Secondary | ICD-10-CM

## 2020-11-08 DIAGNOSIS — K921 Melena: Secondary | ICD-10-CM | POA: Diagnosis present

## 2020-11-08 DIAGNOSIS — N1832 Chronic kidney disease, stage 3b: Secondary | ICD-10-CM | POA: Diagnosis present

## 2020-11-08 DIAGNOSIS — Z885 Allergy status to narcotic agent status: Secondary | ICD-10-CM

## 2020-11-08 DIAGNOSIS — E782 Mixed hyperlipidemia: Secondary | ICD-10-CM | POA: Diagnosis not present

## 2020-11-08 DIAGNOSIS — I252 Old myocardial infarction: Secondary | ICD-10-CM

## 2020-11-08 DIAGNOSIS — N939 Abnormal uterine and vaginal bleeding, unspecified: Secondary | ICD-10-CM | POA: Diagnosis present

## 2020-11-08 DIAGNOSIS — Z955 Presence of coronary angioplasty implant and graft: Secondary | ICD-10-CM

## 2020-11-08 DIAGNOSIS — E039 Hypothyroidism, unspecified: Secondary | ICD-10-CM | POA: Diagnosis present

## 2020-11-08 LAB — CBC
HCT: 18.3 % — ABNORMAL LOW (ref 36.0–46.0)
Hematocrit: 19.1 % — ABNORMAL LOW (ref 34.0–46.6)
Hemoglobin: 5.6 g/dL — CL (ref 11.1–15.9)
Hemoglobin: 5.3 g/dL — CL (ref 12.0–15.0)
MCH: 22.2 pg — ABNORMAL LOW (ref 26.6–33.0)
MCH: 22.8 pg — ABNORMAL LOW (ref 26.0–34.0)
MCHC: 29.3 g/dL — ABNORMAL LOW (ref 31.5–35.7)
MCHC: 29 g/dL — ABNORMAL LOW (ref 30.0–36.0)
MCV: 76 fL — ABNORMAL LOW (ref 79–97)
MCV: 78.9 fL — ABNORMAL LOW (ref 80.0–100.0)
Platelets: 404 10*3/uL (ref 150–450)
Platelets: 388 10*3/uL (ref 150–400)
RBC: 2.52 x10E6/uL — CL (ref 3.77–5.28)
RBC: 2.32 MIL/uL — ABNORMAL LOW (ref 3.87–5.11)
RDW: 15 % (ref 11.7–15.4)
RDW: 16.4 % — ABNORMAL HIGH (ref 11.5–15.5)
WBC: 8.2 10*3/uL (ref 3.4–10.8)
WBC: 6.9 10*3/uL (ref 4.0–10.5)
nRBC: 0 % (ref 0.0–0.2)

## 2020-11-08 LAB — BASIC METABOLIC PANEL
Anion gap: 6 (ref 5–15)
BUN/Creatinine Ratio: 19 (ref 12–28)
BUN: 31 mg/dL — ABNORMAL HIGH (ref 8–27)
BUN: 32 mg/dL — ABNORMAL HIGH (ref 8–23)
CO2: 19 mmol/L — ABNORMAL LOW (ref 20–29)
CO2: 23 mmol/L (ref 22–32)
Calcium: 8.8 mg/dL (ref 8.7–10.3)
Calcium: 8.3 mg/dL — ABNORMAL LOW (ref 8.9–10.3)
Chloride: 97 mmol/L (ref 96–106)
Chloride: 104 mmol/L (ref 98–111)
Creatinine, Ser: 1.65 mg/dL — ABNORMAL HIGH (ref 0.57–1.00)
Creatinine, Ser: 1.82 mg/dL — ABNORMAL HIGH (ref 0.44–1.00)
GFR, Estimated: 27 mL/min — ABNORMAL LOW (ref >60)
Glucose, Bld: 140 mg/dL — ABNORMAL HIGH (ref 70–99)
Glucose: 126 mg/dL — ABNORMAL HIGH (ref 65–99)
Potassium: 5 mmol/L (ref 3.5–5.2)
Potassium: 4.4 mmol/L (ref 3.5–5.1)
Sodium: 133 mmol/L — ABNORMAL LOW (ref 134–144)
Sodium: 133 mmol/L — ABNORMAL LOW (ref 135–145)
eGFR: 31 mL/min/{1.73_m2} — ABNORMAL LOW (ref >59)

## 2020-11-08 LAB — ABO/RH: ABO/RH(D): B POS

## 2020-11-08 NOTE — Telephone Encounter (Signed)
Called by [employer] that pt's hgb 5.6 RBC 2.52 I called pt and left message to come to ER,  I then did reach her son Elisse Pennick and asked him to bring her to ER. Explained she should not drive but her Hgb was very low, very anemic she needed to be worked up and get blood.  He understood and would bring her.

## 2020-11-08 NOTE — ED Triage Notes (Signed)
Was told by doctors office to come to ED for blood transfusion - with hemoglobin of 5. Pt also reports she has to have heart cath next week.

## 2020-11-08 NOTE — Patient Instructions (Addendum)
Medication Instructions:  Your physician recommends that you continue on your current medications as directed. Please refer to the Current Medication list given to you today.  *If you need a refill on your cardiac medications before your next appointment, please call your pharmacy*   Lab Work: Lab work to be done today--BMP and CBC If you have labs (blood work) drawn today and your tests are completely normal, you will receive your results only by: [name] Kitchen MyChart Message (if you have MyChart) OR . A paper copy in the mail If you have any lab test that is abnormal or we need to change your treatment, we will call you to review the results.   Testing/Procedures: Your physician has requested that you have a cardiac catheterization. Cardiac catheterization is used to diagnose and/or treat various heart conditions. Doctors may recommend this procedure for a number of different reasons. The most common reason is to evaluate chest pain. Chest pain can be a symptom of coronary artery disease (CAD), and cardiac catheterization can show whether plaque is narrowing or blocking your heart's arteries. This procedure is also used to evaluate the valves, as well as measure the blood flow and oxygen levels in different parts of your heart. For further information please visit [email or web address]. Please follow instruction sheet, as given.  Scheduled for March 24,2022   Follow-Up: At [facility], you and your health needs are our priority.  As part of our continuing mission to provide you with exceptional heart care, we have created designated Provider Care Teams.  These Care Teams include your primary Cardiologist (physician) and Advanced Practice Providers (APPs -  Physician Assistants and Nurse Practitioners) who all work together to provide you with the care you need, when you need it.  We recommend signing up for the patient portal called "MyChart".  Sign up information is provided on this After Visit  Summary.  MyChart is used to connect with patients for Virtual Visits (Telemedicine).  Patients are able to view lab/test results, encounter notes, upcoming appointments, etc.  Non-urgent messages can be sent to your provider as well.   To learn more about what you can do with MyChart, go to [email or web address].    Your next appointment:   To be arranged after procedure  The format for your next appointment:   In Person  Provider:   You may see Larae Grooms, MD or one of the following Advanced Practice Providers on your designated Care Team:    Melina Copa, PA-C  Ermalinda Barrios, PA-C    Other Instructions    [facility] OFFICE [place], [place] [address] Dept: [phone] Loc: [place]  11/08/2020  You are scheduled for a Cardiac Catheterization on Thursday, March 24 with Dr. Larae Grooms.  1. Please arrive at the [facility] (Main Entrance A) at [facility]: [address] [place], [place] [address] at 7:00 AM (This time is two hours before your procedure to ensure your preparation). Free valet parking service is available.   Special note: Every effort is made to have your procedure done on time. Please understand that emergencies sometimes delay scheduled procedures.  2. Diet: Do not eat solid foods after midnight.  The patient may have clear liquids until 5am upon the day of the procedure.  3. Labs: Lab work was done in office on 11/08/20 4. Medication instructions in preparation for your procedure: Do not take lisinopril-hydrochlorothiazide the  day before the procedure and the  morning of the procedure   Contrast Allergy: No   On the morning of your procedure, take your Aspirin and Brilinta and any morning medicines NOT listed above.  You may use sips of water.  5. Plan for one night stay--bring personal belongings. 6. Bring a  current list of your medications and current insurance cards. 7. You MUST have a responsible person to drive you home. 8. Someone MUST be with you the first 24 hours after you arrive home or your discharge will be delayed. 9. Please wear clothes that are easy to get on and off and wear slip-on shoes.  Thank you for allowing [place] to care for you!   -- [facility]   Due to recent COVID-19 restrictions implemented by our local and state authorities and in an effort to keep both patients and staff as safe as possible, our hospital system requires COVID-19 testing prior to certain scheduled hospital procedures.  Please go to [address]. [place], [place] [address] on 11/13/20 at 1:25 PM  .  This is a drive up testing site.  You will not need to exit your vehicle.  You will not be billed at the time of testing but may receive a bill later depending on your insurance. You must agree to self-quarantine from the time of your testing until the procedure date on 11/15/20.  This should included staying home with ONLY the people you live with.  Avoid take-out, grocery store shopping or leaving the house for any non-emergent reason.  Failure to have your COVID-19 test done on the date and time you have been scheduled will result in cancellation of your procedure.  Please call our office at [phone] if you have any questions.

## 2020-11-08 NOTE — Telephone Encounter (Signed)
Spoke to patient about her return to PT. She is going to have Heart craterization and will not be able to participate until the doctor gives her clearance. Patient will calls up when she is able to return to therapy.  Earlie Counts, PT @3 /17/2022@ 4:43 PM '

## 2020-11-09 DIAGNOSIS — Z955 Presence of coronary angioplasty implant and graft: Secondary | ICD-10-CM | POA: Diagnosis not present

## 2020-11-09 DIAGNOSIS — Z885 Allergy status to narcotic agent status: Secondary | ICD-10-CM | POA: Diagnosis not present

## 2020-11-09 DIAGNOSIS — I10 Essential (primary) hypertension: Secondary | ICD-10-CM | POA: Diagnosis not present

## 2020-11-09 DIAGNOSIS — R002 Palpitations: Secondary | ICD-10-CM | POA: Diagnosis not present

## 2020-11-09 DIAGNOSIS — N939 Abnormal uterine and vaginal bleeding, unspecified: Secondary | ICD-10-CM

## 2020-11-09 DIAGNOSIS — Z8249 Family history of ischemic heart disease and other diseases of the circulatory system: Secondary | ICD-10-CM | POA: Diagnosis not present

## 2020-11-09 DIAGNOSIS — K5792 Diverticulitis of intestine, part unspecified, without perforation or abscess without bleeding: Secondary | ICD-10-CM | POA: Diagnosis not present

## 2020-11-09 DIAGNOSIS — I2510 Atherosclerotic heart disease of native coronary artery without angina pectoris: Secondary | ICD-10-CM | POA: Diagnosis not present

## 2020-11-09 DIAGNOSIS — N183 Chronic kidney disease, stage 3 unspecified: Secondary | ICD-10-CM | POA: Diagnosis not present

## 2020-11-09 DIAGNOSIS — Z87440 Personal history of urinary (tract) infections: Secondary | ICD-10-CM | POA: Diagnosis not present

## 2020-11-09 DIAGNOSIS — D500 Iron deficiency anemia secondary to blood loss (chronic): Secondary | ICD-10-CM | POA: Diagnosis not present

## 2020-11-09 DIAGNOSIS — D62 Acute posthemorrhagic anemia: Secondary | ICD-10-CM

## 2020-11-09 DIAGNOSIS — Z7982 Long term (current) use of aspirin: Secondary | ICD-10-CM | POA: Diagnosis not present

## 2020-11-09 DIAGNOSIS — Z87891 Personal history of nicotine dependence: Secondary | ICD-10-CM | POA: Diagnosis not present

## 2020-11-09 DIAGNOSIS — E039 Hypothyroidism, unspecified: Secondary | ICD-10-CM | POA: Diagnosis not present

## 2020-11-09 DIAGNOSIS — K921 Melena: Secondary | ICD-10-CM | POA: Diagnosis not present

## 2020-11-09 DIAGNOSIS — R195 Other fecal abnormalities: Secondary | ICD-10-CM | POA: Diagnosis not present

## 2020-11-09 DIAGNOSIS — N814 Uterovaginal prolapse, unspecified: Secondary | ICD-10-CM | POA: Diagnosis not present

## 2020-11-09 DIAGNOSIS — N1831 Chronic kidney disease, stage 3a: Secondary | ICD-10-CM | POA: Diagnosis not present

## 2020-11-09 DIAGNOSIS — R3915 Urgency of urination: Secondary | ICD-10-CM | POA: Diagnosis not present

## 2020-11-09 DIAGNOSIS — I129 Hypertensive chronic kidney disease with stage 1 through stage 4 chronic kidney disease, or unspecified chronic kidney disease: Secondary | ICD-10-CM | POA: Diagnosis not present

## 2020-11-09 DIAGNOSIS — R531 Weakness: Secondary | ICD-10-CM | POA: Diagnosis not present

## 2020-11-09 DIAGNOSIS — Z79899 Other long term (current) drug therapy: Secondary | ICD-10-CM | POA: Diagnosis not present

## 2020-11-09 DIAGNOSIS — Z20822 Contact with and (suspected) exposure to covid-19: Secondary | ICD-10-CM | POA: Diagnosis not present

## 2020-11-09 DIAGNOSIS — R102 Pelvic and perineal pain: Secondary | ICD-10-CM | POA: Diagnosis not present

## 2020-11-09 DIAGNOSIS — I252 Old myocardial infarction: Secondary | ICD-10-CM | POA: Diagnosis not present

## 2020-11-09 DIAGNOSIS — Z888 Allergy status to other drugs, medicaments and biological substances status: Secondary | ICD-10-CM | POA: Diagnosis not present

## 2020-11-09 DIAGNOSIS — K5900 Constipation, unspecified: Secondary | ICD-10-CM | POA: Diagnosis not present

## 2020-11-09 LAB — RESP PANEL BY RT-PCR (FLU A&B, COVID) ARPGX2
Influenza A by PCR: NEGATIVE
Influenza B by PCR: NEGATIVE
SARS Coronavirus 2 by RT PCR: NEGATIVE

## 2020-11-09 LAB — PREPARE RBC (CROSSMATCH)

## 2020-11-09 LAB — HEMOGLOBIN AND HEMATOCRIT, BLOOD
HCT: 25.2 % — ABNORMAL LOW (ref 36.0–46.0)
Hemoglobin: 7.9 g/dL — ABNORMAL LOW (ref 12.0–15.0)

## 2020-11-09 MED ORDER — DOCUSATE SODIUM 100 MG PO CAPS
200.0000 mg | ORAL_CAPSULE | Freq: Every day | ORAL | Status: DC
  Administered 2020-11-09: 200 mg via ORAL
  Filled 2020-11-09: qty 2

## 2020-11-09 MED ORDER — SODIUM CHLORIDE 0.9 % IV SOLN: INTRAVENOUS | Status: DC

## 2020-11-09 MED ORDER — ATORVASTATIN CALCIUM 80 MG PO TABS
80.0000 mg | ORAL_TABLET | Freq: Every day | ORAL | Status: DC
  Administered 2020-11-09: 80 mg via ORAL
  Filled 2020-11-09: qty 1

## 2020-11-09 MED ORDER — LEVOTHYROXINE SODIUM 75 MCG PO TABS
175.0000 ug | ORAL_TABLET | ORAL | Status: DC
  Administered 2020-11-09: 175 ug via ORAL
  Filled 2020-11-09: qty 1

## 2020-11-09 MED ORDER — ACETAMINOPHEN 325 MG PO TABS: 650.0000 mg | ORAL_TABLET | Freq: Four times a day (QID) | ORAL | Status: DC | PRN

## 2020-11-09 MED ORDER — DOCUSATE SODIUM 100 MG PO CAPS
100.0000 mg | ORAL_CAPSULE | Freq: Every day | ORAL | Status: DC
  Administered 2020-11-09 – 2020-11-10 (×2): 100 mg via ORAL
  Filled 2020-11-09 (×2): qty 1

## 2020-11-09 MED ORDER — SULFAMETHOXAZOLE-TRIMETHOPRIM 400-80 MG PO TABS
1.0000 | ORAL_TABLET | Freq: Every day | ORAL | Status: DC
  Administered 2020-11-09 – 2020-11-10 (×2): 1 via ORAL
  Filled 2020-11-09 (×2): qty 1

## 2020-11-09 MED ORDER — TROSPIUM CHLORIDE 20 MG PO TABS
60.0000 mg | ORAL_TABLET | Freq: Every day | ORAL | Status: DC
  Filled 2020-11-09: qty 3

## 2020-11-09 MED ORDER — ONDANSETRON HCL 4 MG/2ML IJ SOLN: 4.0000 mg | Freq: Four times a day (QID) | INTRAMUSCULAR | Status: DC | PRN

## 2020-11-09 MED ORDER — TROSPIUM CHLORIDE 20 MG PO TABS
20.0000 mg | ORAL_TABLET | Freq: Two times a day (BID) | ORAL | Status: DC
  Administered 2020-11-09 – 2020-11-10 (×3): 20 mg via ORAL
  Filled 2020-11-09 (×4): qty 1

## 2020-11-09 MED ORDER — ACETAMINOPHEN 650 MG RE SUPP: 650.0000 mg | Freq: Four times a day (QID) | RECTAL | Status: DC | PRN

## 2020-11-09 MED ORDER — SODIUM CHLORIDE 0.9 % IV SOLN: 10.0000 mL/h | Freq: Once | INTRAVENOUS | Status: DC

## 2020-11-09 MED ORDER — ONDANSETRON HCL 4 MG PO TABS: 4.0000 mg | ORAL_TABLET | Freq: Four times a day (QID) | ORAL | Status: DC | PRN

## 2020-11-09 MED ORDER — CHLORHEXIDINE GLUCONATE CLOTH 2 % EX PADS
6.0000 | MEDICATED_PAD | Freq: Every day | CUTANEOUS | Status: DC
  Administered 2020-11-10: 6 via TOPICAL

## 2020-11-09 NOTE — Progress Notes (Signed)
Foley catheter inserted post-pessary removal.

## 2020-11-09 NOTE — Progress Notes (Addendum)
85 year old WF [still working]community dwelling CAD + STEMI + DES RCA Frequent UTIs 02/12/2019-vaginal prolapse + pessary HTN Hypothyroid Diverticulitis  Recent onset vaginal bleed since 10-24-20-changing pads 2X, 3 times/D + R pelvic pain X 1 year-supposed to see urogynecology rethis issue  Evaluated 3/17 Dr. Irish Lack S OB palpitation generalized weakness-hemoglobin 5.6  Patient also reports tarry dark stool X several weeks with recurrence over past several days  Dr. Dellis Filbert of OB/GYN and I discussed the patient's care-she indicates she will be by to evaluate pessary-source of GU/GYyn bleeding    BP (!) 113/42   Pulse 71   Temp 98.3 F (36.8 C) (Oral)   Resp 16   Ht 5\' 3"  (1.6 m)   Wt 70.3 kg   SpO2 91%   BMI 27.45 kg/m  Awake alert coherent somewhat irritable as has been NPO today eomi ncat no focal deficit abd soft nt nd no rebound no guard   P- Await GYN input Received 2 u PRBC--post tx hemoglobin 7.9 Keep clear liquid diet until evaluated by GYN If any GI bleed or drop in hemoglobin in morning may need GI input

## 2020-11-09 NOTE — H&P (Signed)
History and Physical    Abigail [name] VOZ:[id number] DOB: 1936-02-07 DOA: 11/08/2020  PCP: [name], FNP  Patient coming from: Home  I have personally briefly reviewed patient's old medical records in [place]  Chief Complaint: Anemia  HPI: Abigail [name] is a 85 y.o. female with medical history significant of CAD, HTN, uterine prolapse s/p intrauterine pessary.  Pt has had progressive generalized weakness, SOB, DOE for past couple of months.  Pt scheduled to see Dr. Irish Lack on 3/21, and actually it looks like got scheduled for [facility] on 3/24.  However, lab work ordered as outpt resulted demonstrating HGB 5.6.  Thus she was sent in to the ED this evening.  Pt reports that she has actually had some vaginal bleeding ongoing for many months though this has become more constant recently.  Has dark stools but has constipation.  No fevers, chills.   ED Course: HGB 5.3.  Creat 1.8 up from 1.3 last month.  2u PRBC transfusion ordered.   Review of Systems: As per HPI, otherwise all review of systems negative.  Past Medical History:  Diagnosis Date  . Carotid artery occlusion   . Complication of anesthesia    hard to wake up per pt  . Diverticulitis   . Hypertension   . Hypothyroid   . Intrauterine pessary   . STEMI (ST elevation myocardial infarction) ([place]) 10/26/2019   DES RCA    Past Surgical History:  Procedure Laterality Date  . BACK SURGERY  1980  . CORONARY/GRAFT ACUTE MI REVASCULARIZATION N/A 10/26/2019   Procedure: Coronary/Graft Acute MI Revascularization;  Surgeon: Sherren Mocha, MD;  Location: [place] [facility];  Service: Cardiovascular;  Laterality: N/A;  . ELBOW SURGERY     left  . FRACTURE SURGERY    . LEFT HEART CATH AND CORONARY ANGIOGRAPHY N/A 10/26/2019   Procedure: LEFT HEART CATH AND CORONARY ANGIOGRAPHY;  Surgeon: Sherren Mocha, MD;  Location: [place] [facility];  Service: Cardiovascular;  Laterality: N/A;  . SPINE SURGERY       reports  that she quit smoking about 34 years ago. She has never used smokeless tobacco. She reports that she does not drink alcohol and does not use drugs.  Allergies  Allergen Reactions  . Codeine Nausea And Vomiting  . Norvasc [Amlodipine] Rash and Other (See Comments)    [place]    Family History  Problem Relation Age of Onset  . Hypertension Mother      Prior to Admission medications   Medication Sig Start Date End Date Taking? Authorizing Provider  aspirin EC 81 MG tablet Take 81 mg by mouth daily.   Yes [provider]  atorvastatin (LIPITOR) 80 MG tablet Take 1 tablet (80 mg total) by mouth daily at 6 PM. 09/18/20  Yes Jettie Booze, MD  Cranberry 1000 MG CAPS Take 1,000 mg by mouth daily at 12 noon.   Yes [provider]  docusate sodium (COLACE) 100 MG capsule Take 100-200 mg by mouth See admin instructions. Take 100 mg in the morning and 200 mg at bedtime   Yes [provider]  estradiol (ESTRACE) 0.1 MG/GM vaginal cream Place 0.5 g vaginally 2 (two) times a week. Place 0.5g twice a week at opening of vagina Patient taking differently: Place 0.5 g vaginally See admin instructions. Place 0.5g twice a week at opening of vagina 10/29/20  Yes [name], MD  levothyroxine (SYNTHROID) 175 MCG tablet TAKE 1 TABLET BY MOUTH EVERY MORNING ON  [place] Patient taking differently: Take 175 mcg by mouth See admin instructions. Monday-Friday 09/28/20  Yes [name], FNP  lisinopril-hydrochlorothiazide (ZESTORETIC) 20-25 MG tablet Take 0.5 tablets by mouth 3 (three) times a week. Take on Monday Wednesday and Friday only Patient taking differently: Take 0.5 tablets by mouth every Monday, Wednesday, and Friday. 12/02/19  Yes Barrett, Evelene Croon, PA-C  nitroGLYCERIN (NITROSTAT) 0.4 MG SL tablet Place 1 tablet (0.4 mg total) under the tongue every 5 (five) minutes x 3 doses as needed for chest pain. 10/28/19  Yes Duke, Tami Lin, PA  OXYQUINOLONE SULFATE  VAGINAL (TRIMO-SAN) 0.025 % GEL Place 1 application vaginally as needed. Patient taking differently: Place 1 application vaginally daily as needed (build up vaginal insides/ keep clean out). 10/26/20  Yes [name], MD  sulfamethoxazole-trimethoprim (BACTRIM) 400-80 MG tablet Take 1 tablet by mouth daily. Start medication after your course of ciprofloxacin is complete. 10/26/20  Yes [name], MD  ticagrelor (BRILINTA) 90 MG TABS tablet Take 1 tablet (90 mg total) by mouth 2 (two) times daily. 10/28/19  Yes Duke, Tami Lin, PA  Trospium Chloride 60 MG CP24 Take 1 capsule (60 mg total) by mouth daily. Patient taking differently: Take 60 mg by mouth daily. 10/26/20  Yes [name], MD    Physical Exam: Vitals:   11/09/20 0445 11/09/20 0500 11/09/20 0515 11/09/20 0530  BP: (!) 122/98 (!) 138/50 (!) 152/54 (!) 142/50  Pulse: 73 72 77 72  Resp: 17 18 14 18   Temp:  98 F (36.7 C)    TempSrc:  Oral    SpO2: 95% 95% 97% 94%  Weight:      Height:        Constitutional: NAD, calm, comfortable Eyes: PERRL, lids and conjunctivae normal ENMT: Mucous membranes are moist. Posterior pharynx clear of any exudate or lesions.Normal dentition.  Neck: normal, supple, no masses, no thyromegaly Respiratory: clear to auscultation bilaterally, no wheezing, no crackles. Normal respiratory effort. No accessory muscle use.  Cardiovascular: Regular rate and rhythm, no murmurs / rubs / gallops. No extremity edema. 2+ pedal pulses. No carotid bruits.  Abdomen: no tenderness, no masses palpated. No hepatosplenomegaly. Bowel sounds positive.  Musculoskeletal: no clubbing / cyanosis. No joint deformity upper and lower extremities. Good ROM, no contractures. Normal muscle tone.  Skin: no rashes, lesions, ulcers. No induration Neurologic: CN 2-12 grossly intact. Sensation intact, DTR normal. Strength 5/5 in all 4.  Psychiatric: Normal judgment and insight. Alert and oriented x 3. Normal  mood.    Labs on Admission: I have personally reviewed following labs and imaging studies  CBC: Recent Labs  Lab 11/08/20 1119 11/08/20 1920  WBC 8.2 6.9  HGB 5.6* 5.3*  HCT 19.1* 18.3*  MCV 76* 78.9*  PLT 404 443   Basic Metabolic Panel: Recent Labs  Lab 11/08/20 1119 11/08/20 1920  NA 133* 133*  K 5.0 4.4  CL 97 104  CO2 19* 23  GLUCOSE 126* 140*  BUN 31* 32*  CREATININE 1.65* 1.82*  CALCIUM 8.8 8.3*   GFR: Estimated Creatinine Clearance: 22.4 mL/min (A) (by C-G formula based on SCr of 1.82 mg/dL (H)). Liver Function Tests: No results for input(s): AST, ALT, ALKPHOS, BILITOT, PROT, ALBUMIN in the last 168 hours. No results for input(s): LIPASE, AMYLASE in the last 168 hours. No results for input(s): AMMONIA in the last 168 hours. Coagulation Profile: No results for input(s): INR, PROTIME in the last 168 hours. Cardiac Enzymes: No results for  input(s): CKTOTAL, CKMB, CKMBINDEX, TROPONINI in the last 168 hours. BNP (last 3 results) No results for input(s): PROBNP in the last 8760 hours. HbA1C: No results for input(s): HGBA1C in the last 72 hours. CBG: No results for input(s): GLUCAP in the last 168 hours. Lipid Profile: No results for input(s): CHOL, HDL, LDLCALC, TRIG, CHOLHDL, LDLDIRECT in the last 72 hours. Thyroid Function Tests: No results for input(s): TSH, T4TOTAL, FREET4, T3FREE, THYROIDAB in the last 72 hours. Anemia Panel: No results for input(s): VITAMINB12, FOLATE, FERRITIN, TIBC, IRON, RETICCTPCT in the last 72 hours. Urine analysis:    Component Value Date/Time   COLORURINE YELLOW 10/24/2020 1631   APPEARANCEUR CLEAR 10/24/2020 1631   LABSPEC 1.008 10/24/2020 1631   PHURINE 6.0 10/24/2020 1631   GLUCOSEU NEGATIVE 10/24/2020 1631   HGBUR 3+ (A) 10/24/2020 1631   BILIRUBINUR NEGATIVE 07/09/2020 [place] 10/24/2020 1631   PROTEINUR NEGATIVE 10/24/2020 1631   UROBILINOGEN 0.2 07/09/2020 1018   UROBILINOGEN 0.2 04/05/2012 1606    NITRITE NEGATIVE 07/09/2020 1018   NITRITE NEGATIVE 02/18/2020 1710   LEUKOCYTESUR Trace (A) 07/09/2020 1018   LEUKOCYTESUR LARGE (A) 02/18/2020 1710    Radiological Exams on Admission: No results found.  EKG: Independently reviewed.  Assessment/Plan Principal Problem:   Acute blood loss anemia Active Problems:   Chronic kidney disease, stage III (moderate) (HCC)   Essential hypertension   Vaginal bleeding   Occult blood positive stool    1. Acute blood loss anemia - 1. DDx includes vaginal bleeding vs GI bleeding 1. History per pt sounds more suspicious for vaginal bleeding 2. However, patients OBGYN, who happens to be on call tonight, said that last time she was in office, on March 2nd she only had small amount of bleeding and small ulcer. 3. Hemoccult IS positive; however pt has constipation (so if GIB then not fast GIB). 2. Plan currently: 1. OBGYN coming in this AM to remove pessary and do pelvic exam on patient and determine if this a source of major bleeding. 2. If not, then would call GI as next step. 3. Pt getting 2u PRBC transfusion in the meantime 4. Will put pt on clear liquid diet for the moment 5. Tele monitor 6. Repeat H/H at noon 2. AKI on CKD 3 - 1. Likely secondary to #1 above 2. Hold lisinopril-HCTZ 3. IVF in the form of PRBC transfusion 4. Repeat BMP tomorrow AM 5. Strict intake and output 3. HTN - 1. Hold lisinopril-HCTZ in setting of AKI  DVT prophylaxis: SCDs Code Status: Full Family Communication: No family in room Disposition Plan: Home after bleeding source identified and stopped and anemia treated Consults called: EDP spoke with Dr. Dellis Filbert Admission status: Admit to inpatient  Severity of Illness: The appropriate patient status for this patient is INPATIENT. Inpatient status is judged to be reasonable and necessary in order to provide the required intensity of service to ensure the patient's safety. The patient's presenting symptoms,  physical exam findings, and initial radiographic and laboratory data in the context of their chronic comorbidities is felt to place them at high risk for further clinical deterioration. Furthermore, it is not anticipated that the patient will be medically stable for discharge from the hospital within 2 midnights of admission. The following factors support the patient status of inpatient.   IP status due to: ABLA with HGB 5.3 requiring transfusion  * I certify that at the point of admission it is my clinical judgment that the patient will require inpatient  hospital care spanning beyond 2 midnights from the point of admission due to high intensity of service, high risk for further deterioration and high frequency of surveillance required.*    Haygen Zebrowski M. DO [facility]  How to contact the [facility] Attending or Consulting provider [place] or covering provider during after hours [place], for this patient?  1. Check the care team in [facility] and look for a) attending/consulting [facility] provider listed and b) the [facility] team listed 2. Log into [email or web address]  Amion Physician Scheduling and messaging for groups and whole hospitals  On call and physician scheduling software for group practices, residents, hospitalists and other medical providers for call, clinic, rotation and shift schedules. On[employer] is a hospital-wide system for scheduling doctors and paging doctors on call. EasyPlot is for scientific plotting and data analysis.  [email or web address]  and use [place]'s universal password to access. If you do not have the password, please contact the hospital operator.  3. Locate the [facility] provider you are looking for under [facility] and page to a number that you can be directly reached. 4. If you still have difficulty reaching the provider, please page the [facility] (Director on Call) for the Hospitalists listed on amion for assistance.  11/09/2020, 5:49 AM

## 2020-11-09 NOTE — Plan of Care (Signed)

## 2020-11-09 NOTE — ED Provider Notes (Signed)
[facility] EMERGENCY DEPARTMENT Provider Note   CSN: [id number] Arrival date & time: 11/08/20  1844     History Chief Complaint  Patient presents with  . Abnormal Lab    Abigail [name] is a 85 y.o. female with a history of CAD and STEMI s/p DES RCA, urinary urgency, complete uterine prolapse using a pessary, diverticulitis, HTN, and hypothyroidism who presents to the emergency department with a chief complaint of abnormal lab result.  The patient reports that she has been having frequent, recurrent UTIs since June 2020.  Since that time, she also reports has been feeling generally weak since that time and having palpitations, shortness of breath.  Symptoms are worse with exertion.  She has also been having intermittent chest pain and was seen for the symptoms by her cardiologist, Dr. Emeterio Reeve, earlier today.  Labs were drawn in his office and her hemoglobin was found to be 5.6 and she was advised to come to the emergency department for further work-up and evaluation.  She also reports that she has been having vaginal bleeding for some time, but reports an increase since she was seen by her OB/GYN on March 2.  She was previously wearing 1 pad daily, but states that she is now changing her pads 2-3 times daily.  She also endorses intermittent right-sided pelvic pain, but states that this has been present for almost a year.  Patient was recently referred to urogynecology to discuss surgical options, but was advised that she needed to get cardiology clearance first.  She also reports that she has noticed black and dark stools intermittently.  She notes that she was having dark stools over the last few weeks, but this seemed to subside, but it has recurred over the last few days.  She denies hematochezia, fever, chills, syncope, rash, epistaxis, hematuria, back pain, leg swelling.  No history of previous GI bleeds.  No NSAID use.  The history is provided by the patient and  medical records. No language interpreter was used.       Past Medical History:  Diagnosis Date  . Carotid artery occlusion   . Complication of anesthesia    hard to wake up per pt  . Diverticulitis   . Hypertension   . Hypothyroid   . Intrauterine pessary   . STEMI (ST elevation myocardial infarction) ([place]) 10/26/2019   DES RCA    Patient Active Problem List   Diagnosis Date Noted  . Acute blood loss anemia 11/09/2020  . Vaginal bleeding 11/09/2020  . Occult blood positive stool 11/09/2020  . Chest pain with moderate risk for cardiac etiology 12/01/2019  . Urinary urgency 12/01/2019  . STEMI involving right coronary artery ([place]) 10/27/2019  . STEMI (ST elevation myocardial infarction) ([place]) 10/26/2019  . Anxiety 02/09/2019  . Prediabetes 12/29/2018  . Essential hypertension 08/27/2018  . Vitamin D deficiency 06/25/2018  . Hyperlipidemia 06/25/2018  . Hypothyroidism 06/25/2018  . Benign hypertension with CKD (chronic kidney disease) stage III ([place]) 06/25/2018  . Chronic kidney disease, stage III (moderate) ([place]) 06/25/2018  . OAB (overactive bladder) 08/26/2016  . Complete uterine prolapse 06/21/2015  . CAP (community acquired pneumonia) 04/12/2012  . Bacteremia 04/08/2012  . Urinary tract infection without hematuria 04/05/2012  . LLQ abdominal pain 04/05/2012  . Hyponatremia 04/05/2012    Past Surgical History:  Procedure Laterality Date  . BACK SURGERY  1980  . CORONARY/GRAFT ACUTE MI REVASCULARIZATION N/A 10/26/2019   Procedure: Coronary/Graft Acute MI Revascularization;  Surgeon: Sherren Mocha,  MD;  Location: [facility];  Service: Cardiovascular;  Laterality: N/A;  . ELBOW SURGERY     left  . FRACTURE SURGERY    . LEFT HEART CATH AND CORONARY ANGIOGRAPHY N/A 10/26/2019   Procedure: LEFT HEART CATH AND CORONARY ANGIOGRAPHY;  Surgeon: Sherren Mocha, MD;  Location: [facility];  Service: Cardiovascular;  Laterality: N/A;  . SPINE SURGERY        OB History    Gravida  2   Para  2   Term  2   Preterm      AB      Living  2     SAB      IAB      Ectopic      Multiple      Live Births              Family History  Problem Relation Age of Onset  . Hypertension Mother     Social History   Tobacco Use  . Smoking status: Former Smoker    Quit date: 04/05/1986    Years since quitting: 34.6  . Smokeless tobacco: Never Used  Vaping Use  . Vaping Use: Never used  Substance Use Topics  . Alcohol use: No  . Drug use: No    Home Medications Prior to Admission medications   Medication Sig Start Date End Date Taking? Authorizing Provider  aspirin EC 81 MG tablet Take 81 mg by mouth daily.   Yes [provider]  atorvastatin (LIPITOR) 80 MG tablet Take 1 tablet (80 mg total) by mouth daily at 6 PM. 09/18/20  Yes Jettie Booze, MD  Cranberry 1000 MG CAPS Take 1,000 mg by mouth daily at 12 noon.   Yes [provider]  docusate sodium (COLACE) 100 MG capsule Take 100-200 mg by mouth See admin instructions. Take 100 mg in the morning and 200 mg at bedtime   Yes [provider]  estradiol (ESTRACE) 0.1 MG/GM vaginal cream Place 0.5 g vaginally 2 (two) times a week. Place 0.5g twice a week at opening of vagina Patient taking differently: Place 0.5 g vaginally See admin instructions. Place 0.5g twice a week at opening of vagina 10/29/20  Yes [name], MD  levothyroxine (SYNTHROID) 175 MCG tablet TAKE 1 TABLET BY MOUTH EVERY MORNING ON MONDAY - [place] Patient taking differently: Take 175 mcg by mouth See admin instructions. Monday-Friday 09/28/20  Yes [name], FNP  lisinopril-hydrochlorothiazide (ZESTORETIC) 20-25 MG tablet Take 0.5 tablets by mouth 3 (three) times a week. Take on Monday Wednesday and Friday only Patient taking differently: Take 0.5 tablets by mouth every Monday, Wednesday, and Friday. 12/02/19  Yes Barrett, Evelene Croon, PA-C  nitroGLYCERIN (NITROSTAT) 0.4  MG SL tablet Place 1 tablet (0.4 mg total) under the tongue every 5 (five) minutes x 3 doses as needed for chest pain. 10/28/19  Yes Duke, Tami Lin, PA  OXYQUINOLONE SULFATE VAGINAL (TRIMO-SAN) 0.025 % GEL Place 1 application vaginally as needed. Patient taking differently: Place 1 application vaginally daily as needed (build up vaginal insides/ keep clean out). 10/26/20  Yes [name], MD  sulfamethoxazole-trimethoprim (BACTRIM) 400-80 MG tablet Take 1 tablet by mouth daily. Start medication after your course of ciprofloxacin is complete. 10/26/20  Yes [name], MD  ticagrelor (BRILINTA) 90 MG TABS tablet Take 1 tablet (90 mg total) by mouth 2 (two) times daily. 10/28/19  Yes Duke, Tami Lin, PA  Trospium Chloride 60 MG  CP24 Take 1 capsule (60 mg total) by mouth daily. Patient taking differently: Take 60 mg by mouth daily. 10/26/20  Yes [name], MD    Allergies    Codeine and Norvasc [amlodipine]  Review of Systems   Review of Systems  Constitutional: Positive for fatigue. Negative for activity change, appetite change, chills, diaphoresis and fever.  HENT: Negative for congestion, sinus pressure, sinus pain, sore throat and trouble swallowing.   Respiratory: Negative for cough, choking, shortness of breath and wheezing.   Cardiovascular: Negative for chest pain and palpitations.  Gastrointestinal: Positive for blood in stool. Negative for abdominal pain, diarrhea, nausea and vomiting.  Genitourinary: Positive for vaginal bleeding. Negative for dysuria.  Musculoskeletal: Negative for back pain and myalgias.  Skin: Negative for rash and wound.  Allergic/Immunologic: Negative for immunocompromised state.  Neurological: Positive for weakness. Negative for seizures, syncope, speech difficulty and headaches.  Psychiatric/Behavioral: Negative for confusion.    Physical Exam Updated Vital Signs BP 127/85   Pulse 72   Temp 98 F (36.7 C) (Oral)   Resp 17    Ht 5\' 3"  (1.6 m)   Wt 70.3 kg   SpO2 98%   BMI 27.45 kg/m   Physical Exam Vitals and nursing note reviewed.  Constitutional:      General: She is not in acute distress.    Appearance: She is not ill-appearing, toxic-appearing or diaphoretic.  HENT:     Head: Normocephalic.  Eyes:     Conjunctiva/sclera: Conjunctivae normal.  Cardiovascular:     Rate and Rhythm: Normal rate and regular rhythm.     Pulses: Normal pulses.     Heart sounds: Normal heart sounds. No murmur heard. No friction rub. No gallop.   Pulmonary:     Effort: Pulmonary effort is normal. No respiratory distress.     Breath sounds: No stridor. No wheezing, rhonchi or rales.  Chest:     Chest wall: No tenderness.  Abdominal:     General: There is no distension.     Palpations: Abdomen is soft. There is no mass.     Tenderness: There is no abdominal tenderness. There is no right CVA tenderness, left CVA tenderness, guarding or rebound.     Hernia: No hernia is present.  Genitourinary:    Comments: Chaperoned exam with RN. Rectal tone is normal. Scant dark black stool noted on exam. Small amount of hard stool noted in the vaginal vault. No impaction. No hemorrhoids noted. Musculoskeletal:     Cervical back: Neck supple.     Right lower leg: No edema.     Left lower leg: No edema.  Skin:    General: Skin is warm.     Coloration: Skin is pale.     Findings: No bruising, erythema or rash.  Neurological:     Mental Status: She is alert.  Psychiatric:        Behavior: Behavior normal.     ED Results / Procedures / Treatments   Labs (all labs ordered are listed, but only abnormal results are displayed) Labs Reviewed  BASIC METABOLIC PANEL - Abnormal; Notable for the following components:      Result Value   Sodium 133 (*)    Glucose, Bld 140 (*)    BUN 32 (*)    Creatinine, Ser 1.82 (*)    Calcium 8.3 (*)    GFR, Estimated 27 (*)    All other components within normal limits  CBC - Abnormal; Notable  for the  following components:   RBC 2.32 (*)    Hemoglobin 5.3 (*)    HCT 18.3 (*)    MCV 78.9 (*)    MCH 22.8 (*)    MCHC 29.0 (*)    RDW 16.4 (*)    All other components within normal limits  RESP PANEL BY RT-PCR (FLU A&B, COVID) ARPGX2  URINALYSIS, ROUTINE W REFLEX MICROSCOPIC  HEMOGLOBIN AND HEMATOCRIT, BLOOD  CBG MONITORING, ED  POC OCCULT BLOOD, ED  ABO/RH  TYPE AND SCREEN  PREPARE RBC (CROSSMATCH)    EKG EKG Interpretation  Date/Time:  Thursday November 08 2020 19:23:26 EDT Ventricular Rate:  77 PR Interval:  144 QRS Duration: 84 QT Interval:  390 QTC Calculation: 441 R Axis:   33 Text Interpretation: Normal sinus rhythm Cannot rule out Anterior infarct , age undetermined ST & T wave abnormality, consider inferior ischemia Abnormal ECG Confirmed by Merrily Pew [phone]) on 11/09/2020 5:26:11 AM   Radiology No results found.  Procedures .Critical Care Performed by: Joanne Gavel, PA-C Authorized by: Joanne Gavel, PA-C   Critical care provider statement:    Critical care time (minutes):  40   Critical care time was exclusive of:  Separately billable procedures and treating other patients and teaching time   Critical care was necessary to treat or prevent imminent or life-threatening deterioration of the following conditions:  Circulatory failure   Critical care was time spent personally by me on the following activities:  Ordering and performing treatments and interventions, ordering and review of laboratory studies, ordering and review of radiographic studies, pulse oximetry, re-evaluation of patient's condition, review of old charts, obtaining history from patient or surrogate, examination of patient, evaluation of patient's response to treatment and development of treatment plan with patient or surrogate   I assumed direction of critical care for this patient from another provider in my specialty: no     Care discussed with: admitting provider        Medications Ordered in ED Medications  0.9 %  sodium chloride infusion (0 mL/hr Intravenous Hold 11/09/20 0459)  levothyroxine (SYNTHROID) tablet 175 mcg (has no administration in time range)  atorvastatin (LIPITOR) tablet 80 mg (has no administration in time range)  sulfamethoxazole-trimethoprim (BACTRIM) 400-80 MG per tablet 1 tablet (has no administration in time range)  Trospium Chloride CP24 60 mg (has no administration in time range)  docusate sodium (COLACE) capsule 100 mg (has no administration in time range)  docusate sodium (COLACE) capsule 200 mg (has no administration in time range)  acetaminophen (TYLENOL) tablet 650 mg (has no administration in time range)    Or  acetaminophen (TYLENOL) suppository 650 mg (has no administration in time range)  ondansetron (ZOFRAN) tablet 4 mg (has no administration in time range)    Or  ondansetron (ZOFRAN) injection 4 mg (has no administration in time range)    ED Course  I have reviewed the triage vital signs and the nursing notes.  Pertinent labs & imaging results that were available during my care of the patient were reviewed by me and considered in my medical decision making (see chart for details).    MDM Rules/Calculators/A&P                          85 year old with a history of CAD and STEMI s/p DES RCA, urinary urgency, complete uterine prolapse using a pessary, diverticulitis, HTN, and hypothyroidism who presents to the emergency department who presents the emergency  department with a chief complaint of abnormal lab result.  She was seen by her cardiologist earlier today and had labs drawn that demonstrated a hemoglobin of 5.3.  Vital signs stable.  The patient was discussed with Dr. Dayna Barker, attending physician.  Labs have been reviewed and independently interpreted by me.  Microcytic anemia with a hemoglobin of 5.3, down from 10.2 in May 2021.  Patient has been having some vaginal bleeding associated with her uterine  prolapse and pessary placement.  She also reports that she has been having dark black stool and on rectal exam does have dark stool, but no frank melena.  Hemoccult is positive.  No metabolic derangements.  Will order and transfuse 2 units of packed RBCs.  Discussed the patient with Dr. Dellis Filbert, OBGYN.  Either she or Dr. Wannetta Sender will come to the ER and perform a pelvic exam on the patient since pessary is in place and has been difficult to remove and uncomfortable for the patient previously.  She has a low suspicion for vaginal etiology and recommends looking for another source of anemia.  Since patient does have a positive Hemoccult, pending results of pelvic exam, GI may need to be consulted.  Patient is hemodynamically stable at this time.  Consult to the hospitalist team and Dr. Alcario Drought will accept the patient for admission. The patient appears reasonably stabilized for admission considering the current resources, flow, and capabilities available in the ED at this time, and I doubt any other [facility] requiring further screening and/or treatment in the ED prior to admission.  Final Clinical Impression(s) / ED Diagnoses Final diagnoses:  Acute blood loss anemia    Rx / DC Orders ED Discharge Orders    None       Joanne Gavel, PA-C 11/09/20 [id number]    Merrily Pew, MD 11/09/20 0730

## 2020-11-09 NOTE — Progress Notes (Signed)
Patient was admitted to [phone]. No complaints of pain, on RA, ambulating to bathroom with minimal assistance. Skin intact with some moisture-associated redness under bilateral breasts. Vital signs stable. Patient has updated her son over the phone. Belongings include clothes, phone, charger, purse (fanny pack), shoes, reading glasses, earrings, and a watch.

## 2020-11-09 NOTE — ED Notes (Signed)
PA, Mia came out of pt's room and stated she wanted to leave AMA and wanted to call her son, but didn't have his number. This tech went into room and assisted pt with calling her son Nicole Kindred after pulling his number from her chart. RN [place] notified at this time.

## 2020-11-09 NOTE — ED Notes (Addendum)
Introduced self to pt. Pt denies any symptoms or complaints, other than being hungry. She reports she was having blood work completed outpatient and was found to be anemic and to come to ED. Pt reports she is scheduled for a heart cath next week.

## 2020-11-10 LAB — TYPE AND SCREEN
ABO/RH(D): B POS
Antibody Screen: NEGATIVE
Unit division: 0
Unit division: 0
Unit division: 0
Unit division: 0

## 2020-11-10 LAB — BPAM RBC
Blood Product Expiration Date: 202203242359
Blood Product Expiration Date: 202204102359
Blood Product Expiration Date: 202204132359
Blood Product Expiration Date: 202204202359
ISSUE DATE / TIME: 202203180429
ISSUE DATE / TIME: 202203180432
ISSUE DATE / TIME: 202203180447
ISSUE DATE / TIME: 202203180650
Unit Type and Rh: 5100
Unit Type and Rh: 5100
Unit Type and Rh: 7300
Unit Type and Rh: 7300

## 2020-11-10 LAB — CBC WITH DIFFERENTIAL/PLATELET
Abs Immature Granulocytes: 0.02 10*3/uL (ref 0.00–0.07)
Basophils Absolute: 0 10*3/uL (ref 0.0–0.1)
Basophils Relative: 0 %
Eosinophils Absolute: 0.3 10*3/uL (ref 0.0–0.5)
Eosinophils Relative: 5 %
HCT: 26.3 % — ABNORMAL LOW (ref 36.0–46.0)
Hemoglobin: 8.2 g/dL — ABNORMAL LOW (ref 12.0–15.0)
Immature Granulocytes: 0 %
Lymphocytes Relative: 10 %
Lymphs Abs: 0.6 10*3/uL — ABNORMAL LOW (ref 0.7–4.0)
MCH: 25.2 pg — ABNORMAL LOW (ref 26.0–34.0)
MCHC: 31.2 g/dL (ref 30.0–36.0)
MCV: 80.7 fL (ref 80.0–100.0)
Monocytes Absolute: 0.7 10*3/uL (ref 0.1–1.0)
Monocytes Relative: 12 %
Neutro Abs: 4.3 10*3/uL (ref 1.7–7.7)
Neutrophils Relative %: 73 %
Platelets: 367 10*3/uL (ref 150–400)
RBC: 3.26 MIL/uL — ABNORMAL LOW (ref 3.87–5.11)
RDW: 17.1 % — ABNORMAL HIGH (ref 11.5–15.5)
WBC: 5.9 10*3/uL (ref 4.0–10.5)
nRBC: 0 % (ref 0.0–0.2)

## 2020-11-10 LAB — BASIC METABOLIC PANEL
Anion gap: 8 (ref 5–15)
BUN: 20 mg/dL (ref 8–23)
CO2: 21 mmol/L — ABNORMAL LOW (ref 22–32)
Calcium: 8.4 mg/dL — ABNORMAL LOW (ref 8.9–10.3)
Chloride: 104 mmol/L (ref 98–111)
Creatinine, Ser: 1.55 mg/dL — ABNORMAL HIGH (ref 0.44–1.00)
GFR, Estimated: 33 mL/min — ABNORMAL LOW (ref >60)
Glucose, Bld: 92 mg/dL (ref 70–99)
Potassium: 4.9 mmol/L (ref 3.5–5.1)
Sodium: 133 mmol/L — ABNORMAL LOW (ref 135–145)

## 2020-11-10 MED ORDER — FERROUS SULFATE 300 (60 FE) MG/5ML PO SYRP: 300.0000 mg | ORAL_SOLUTION | Freq: Two times a day (BID) | ORAL | 3 refills | Status: DC

## 2020-11-10 MED ORDER — ASPIRIN EC 81 MG PO TBEC
81.0000 mg | DELAYED_RELEASE_TABLET | Freq: Every day | ORAL | Status: DC
  Administered 2020-11-10: 81 mg via ORAL
  Filled 2020-11-10: qty 1

## 2020-11-10 MED ORDER — TICAGRELOR 90 MG PO TABS
90.0000 mg | ORAL_TABLET | Freq: Two times a day (BID) | ORAL | Status: DC
  Administered 2020-11-10: 90 mg via ORAL
  Filled 2020-11-10: qty 1

## 2020-11-10 MED ORDER — ACETAMINOPHEN 325 MG PO TABS: 650.0000 mg | ORAL_TABLET | Freq: Four times a day (QID) | ORAL | Status: DC | PRN

## 2020-11-10 NOTE — Progress Notes (Signed)
Pt given discharge instructions, prescriptions, and care notes. Pt verbalized understanding AEB no further questions or concerns at this time. IV was discontinued, no redness, pain, or swelling noted at this time. Telemetry discontinued and Centralized Telemetry was notified. Pt left the floor via wheelchair with staff in stable condition. 

## 2020-11-10 NOTE — Discharge Summary (Signed)
Physician Discharge Summary  Abigail [name] KDT:[id number] DOB: May 07, 1936 DOA: 11/08/2020  PCP: [name], FNP  Admit date: 11/08/2020 Discharge date: 11/10/2020  Time spent: 22 minutes  Recommendations for Outpatient Follow-up:  1. New medication ferrous sulfate liquid twice daily 2. Outpatient follow-up Dr. Wannetta Sender gynecology/urogynecology for replacement of pessary 3. We will CC Dr. Irish Lack who plans on cardiac cath in the next week to ensure she is stable for a large pelvic surgery 4. Patient to report to get labs prior to this at PCP office/cardiology office 5. Recommend continuation of suppressive Bactrim  Discharge Diagnoses:  MAIN problem for hospitalization   Acute GU bleeding from pessary irritating vaginal mucosa  Please see below for itemized issues addressed in HOpsital- refer to other progress notes for clarity if needed  Discharge Condition: Good  Diet recommendation: Heart healthy  Filed Weights   11/08/20 1908  Weight: 70.3 kg    History of present illness:  85 year old WF [still working]community dwelling CAD + STEMI + DES RCA Frequent UTIs 02/12/2019-vaginal prolapse + pessary HTN Hypothyroid Diverticulitis  Recent onset vaginal bleed since 10-24-20-changing pads 2X, 3 times/D + R pelvic pain X 1 year-supposed to see urogynecology rethis issue  Evaluated 3/17 Dr. Irish Lack S OB palpitation generalized weakness-hemoglobin 5.6  Patient also reports tarry dark stool X several weeks with recurrence over past several days  Dr. Dellis Filbert of OB/GYN and I discussed the patient's care- Pessary was removed on 3/18 and patient had no further reports of dark stool or bloody pelvic discharge  She had a temporary Foley catheter placed which was removed with success post procedure  She has an appointment next week with cardiology who will be CCed on this note and will need to start iron supplementation in the outpatient setting which I have prescribed for  her  I am appreciative to gynecology for guiding her care-we will CC them in addition    Consultations:  Gynecology Dr. Levoie/schroeder  Discharge Exam: Vitals:   11/09/20 2355 11/10/20 0357  BP: (!) 113/47 (!) 118/47  Pulse: 61 69  Resp: 19 17  Temp: 98.8 F (37.1 C) 98.7 F (37.1 C)  SpO2: 90% 100%    Subj on day of d/c   Doing well no distress no further bleeding eating drinking No chest pain no fever   General Exam on discharge  EOMI NCAT no focal deficit S1-S2 slight murmur across precordium CTA B no added sound rales rhonchi Abdomen soft no rebound Foley catheter in place at time of exam no lower extremity edema  Discharge Instructions   Discharge Instructions    Diet - low sodium heart healthy   Complete by: As directed    Discharge instructions   Complete by: As directed    Please follow with GYN as well as cardiology Dr. Glennon Hamilton will require close follow-up with your blood counts and we will prescribe iron on discharge for you  Dr. Wannetta Sender has indicated that she will see you in her office for another pessary placement-I would recommend that you continue all of your meds including your aspirin and your Brilinta and report any bleeding from your rectum to your regular doctor   Increase activity slowly   Complete by: As directed      Allergies as of 11/10/2020      Reactions   Codeine Nausea And Vomiting   Norvasc [amlodipine] Rash, Other (See Comments)   Petechia      Medication List    TAKE these medications  acetaminophen 325 MG tablet Commonly known as: TYLENOL Take 2 tablets (650 mg total) by mouth every 6 (six) hours as needed for mild pain (or Fever >/= 101).   aspirin EC 81 MG tablet Take 81 mg by mouth daily.   atorvastatin 80 MG tablet Commonly known as: LIPITOR Take 1 tablet (80 mg total) by mouth daily at 6 PM.   Cranberry 1000 MG Caps Take 1,000 mg by mouth daily at 12 noon.   docusate sodium 100 MG  capsule Commonly known as: COLACE Take 100-200 mg by mouth See admin instructions. Take 100 mg in the morning and 200 mg at bedtime   estradiol 0.1 MG/GM vaginal cream Commonly known as: ESTRACE Place 0.5 g vaginally 2 (two) times a week. Place 0.5g twice a week at opening of vagina What changed: when to take this   levothyroxine 175 MCG tablet Commonly known as: SYNTHROID TAKE 1 TABLET BY MOUTH EVERY MORNING ON MONDAY - FRIDAY What changed:   how much to take  how to take this  when to take this  additional instructions   lisinopril-hydrochlorothiazide 20-25 MG tablet Commonly known as: ZESTORETIC Take 0.5 tablets by mouth 3 (three) times a week. Take on Monday Wednesday and Friday only What changed:   when to take this  additional instructions   nitroGLYCERIN 0.4 MG SL tablet Commonly known as: NITROSTAT Place 1 tablet (0.4 mg total) under the tongue every 5 (five) minutes x 3 doses as needed for chest pain.   sulfamethoxazole-trimethoprim 400-80 MG tablet Commonly known as: Bactrim Take 1 tablet by mouth daily. Start medication after your course of ciprofloxacin is complete.   ticagrelor 90 MG Tabs tablet Commonly known as: BRILINTA Take 1 tablet (90 mg total) by mouth 2 (two) times daily.   Trimo-San 0.025 % Gel Generic drug: OXYQUINOLONE SULFATE VAGINAL Place 1 application vaginally as needed. What changed:   when to take this  reasons to take this   Trospium Chloride 60 MG Cp24 Take 1 capsule (60 mg total) by mouth daily.      Allergies  Allergen Reactions  . Codeine Nausea And Vomiting  . Norvasc [Amlodipine] Rash and Other (See Comments)    Petechia      The results of significant diagnostics from this hospitalization (including imaging, microbiology, ancillary and laboratory) are listed below for reference.    Significant Diagnostic Studies: No results found.  Microbiology: Recent Results (from the past 240 hour(s))  Resp Panel by  RT-PCR (Flu A&B, Covid) Nasopharyngeal Swab     Status: None   Collection Time: 11/09/20  3:49 AM   Specimen: Nasopharyngeal Swab; Nasopharyngeal(NP) swabs in vial transport medium  Result Value Ref Range Status   SARS Coronavirus 2 by RT PCR NEGATIVE NEGATIVE Final    Comment: (NOTE) SARS-CoV-2 target nucleic acids are NOT DETECTED.  The SARS-CoV-2 RNA is generally detectable in upper respiratory specimens during the acute phase of infection. The lowest concentration of SARS-CoV-2 viral copies this assay can detect is 138 copies/mL. A negative result does not preclude SARS-Cov-2 infection and should not be used as the sole basis for treatment or other patient management decisions. A negative result may occur with  improper specimen collection/handling, submission of specimen other than nasopharyngeal swab, presence of viral mutation(s) within the areas targeted by this assay, and inadequate number of viral copies(<138 copies/mL). A negative result must be combined with clinical observations, patient history, and epidemiological information. The expected result is Negative.  Fact Sheet for Patients:  EntrepreneurPulse.com.au  Fact Sheet for Healthcare Providers:  IncredibleEmployment.be  This test is no t yet approved or cleared by the [place] FDA and  has been authorized for detection and/or diagnosis of SARS-CoV-2 by FDA under an Emergency Use Authorization (EUA). This EUA will remain  in effect (meaning this test can be used) for the duration of the COVID-19 declaration under Section 564(b)(1) of the Act, 21 U.S.C.section 360bbb-3(b)(1), unless the authorization is terminated  or revoked sooner.       Influenza A by PCR NEGATIVE NEGATIVE Final   Influenza B by PCR NEGATIVE NEGATIVE Final    Comment: (NOTE) The Xpert Xpress SARS-CoV-2/FLU/RSV plus assay is intended as an aid in the diagnosis of influenza from Nasopharyngeal swab  specimens and should not be used as a sole basis for treatment. Nasal washings and aspirates are unacceptable for Xpert Xpress SARS-CoV-2/FLU/RSV testing.  Fact Sheet for Patients: EntrepreneurPulse.com.au  Fact Sheet for Healthcare Providers: IncredibleEmployment.be  This test is not yet approved or cleared by the [place] FDA and has been authorized for detection and/or diagnosis of SARS-CoV-2 by FDA under an Emergency Use Authorization (EUA). This EUA will remain in effect (meaning this test can be used) for the duration of the COVID-19 declaration under Section 564(b)(1) of the Act, 21 U.S.C. section 360bbb-3(b)(1), unless the authorization is terminated or revoked.  Performed at [facility], [place] [address], [place], [place] [address]      Labs: Basic Metabolic Panel: Recent Labs  Lab 11/08/20 1119 11/08/20 1920 11/10/20 0225  NA 133* 133* 133*  K 5.0 4.4 4.9  CL 97 104 104  CO2 19* 23 21*  GLUCOSE 126* 140* 92  BUN 31* 32* 20  CREATININE 1.65* 1.82* 1.55*  CALCIUM 8.8 8.3* 8.4*   Liver Function Tests: No results for input(s): AST, ALT, ALKPHOS, BILITOT, PROT, ALBUMIN in the last 168 hours. No results for input(s): LIPASE, AMYLASE in the last 168 hours. No results for input(s): AMMONIA in the last 168 hours. CBC: Recent Labs  Lab 11/08/20 1119 11/08/20 1920 11/09/20 1059  WBC 8.2 6.9  --   HGB 5.6* 5.3* 7.9*  HCT 19.1* 18.3* 25.2*  MCV 76* 78.9*  --   PLT 404 388  --    Cardiac Enzymes: No results for input(s): CKTOTAL, CKMB, CKMBINDEX, TROPONINI in the last 168 hours. BNP: BNP (last 3 results) Recent Labs    12/01/19 0430  BNP 145.3*    ProBNP (last 3 results) No results for input(s): PROBNP in the last 8760 hours.  CBG: No results for input(s): GLUCAP in the last 168 hours.     Signed:  Nita Sells MD   [facility] 11/10/2020, 9:16 AM

## 2020-11-12 ENCOUNTER — Telehealth: Payer: Self-pay | Admitting: *Deleted

## 2020-11-12 ENCOUNTER — Ambulatory Visit: Payer: PPO | Admitting: Interventional Cardiology

## 2020-11-12 ENCOUNTER — Telehealth: Payer: Self-pay

## 2020-11-12 ENCOUNTER — Other Ambulatory Visit: Payer: Self-pay | Admitting: Interventional Cardiology

## 2020-11-12 NOTE — Telephone Encounter (Signed)
Patient notified.  She is aware she will not need covid testing.  Patient will follow up with Ermalinda Barrios, PA on April 12,2022 as previously scheduled.

## 2020-11-12 NOTE — Telephone Encounter (Signed)
Transition Care Management Unsuccessful Follow-up Telephone Call  Date of discharge and from where:  11/10/2020 [facility]  Attempts:  1st Attempt  Reason for unsuccessful TCM follow-up call:  Left voice message

## 2020-11-12 NOTE — Telephone Encounter (Signed)
-----   Message from Theone Murdoch sent at 11/12/2020  1:27 PM EDT ----- Regarding: FW: pessary Can you please call this patient and throw her on the schedule for a pessary fitting anytime this week? Thanks. ----- Message ----- From: [name], MD Sent: 11/12/2020   7:13 AM EDT To: Theone Murdoch Subject: pessary                                        Hi Alycia,   Could you please call this patient to make an appointment for a pessary fitting for her this week?  Thank you,   Sharyn Lull

## 2020-11-12 NOTE — Telephone Encounter (Addendum)
Abigail [name] is a 85 y.o. female was contacted to be added to the schedule this week.  Pt said she gets her COVID test for her cardiac catherization surgery thursday and she is not to leave the  House.  Pt scheduled her Pessary fitting for the following week 11/21/20 @1pm .

## 2020-11-12 NOTE — Telephone Encounter (Signed)
Jettie Booze, MD  Katrine Coho, RN Cc: Thompson Grayer, RN No. I think sx were related to anemia rather than CAD. Can cancel cath.   JV

## 2020-11-13 ENCOUNTER — Telehealth: Payer: Self-pay

## 2020-11-13 ENCOUNTER — Other Ambulatory Visit (HOSPITAL_COMMUNITY): Payer: PPO

## 2020-11-13 NOTE — Telephone Encounter (Signed)
I called patient and left her a vm to call the office we need to do a TCM call and schedule her for a hospital f/u. Abigail [name]

## 2020-11-13 NOTE — Telephone Encounter (Signed)
Transition Care Management Follow-up Telephone Call  Date of discharge and from where:  11/10/2020 [facility]  How have you been since you were released from the hospital? Doing better  Any questions or concerns? No  Items Reviewed:  Did the pt receive and understand the discharge instructions provided? Yes   Medications obtained and verified? Yes   Other? No   Any new allergies since your discharge? No   Dietary orders reviewed? Yes  Do you have support at home? No   Home Care and Equipment/Supplies: Were home health services ordered? not applicable If so, what is the name of the agency? n/a  Has the agency set up a time to come to the patient's home? not applicable Were any new equipment or medical supplies ordered?  No What is the name of the medical supply agency? n/a Were you able to get the supplies/equipment? not applicable Do you have any questions related to the use of the equipment or supplies? No  Functional Questionnaire: (I = Independent and D = Dependent) ADLs: I  Bathing/Dressing- I  Meal Prep- I  Eating- I  Maintaining continence- I  Transferring/Ambulation- I  Managing Meds- I  Follow up appointments reviewed:   PCP Hospital f/u appt confirmed? Patient did not want to make an appointment now. Says she'd like some one to reach out to her on Monday.  Are transportation arrangements needed? No   If their condition worsens, is the pt aware to call PCP or go to the Emergency Dept.? Yes  Was the patient provided with contact information for the PCP's office or ED? Yes  Was to pt encouraged to call back with questions or concerns? Yes

## 2020-11-14 ENCOUNTER — Ambulatory Visit (INDEPENDENT_AMBULATORY_CARE_PROVIDER_SITE_OTHER): Payer: PPO | Admitting: Obstetrics and Gynecology

## 2020-11-14 ENCOUNTER — Other Ambulatory Visit: Payer: Self-pay

## 2020-11-14 ENCOUNTER — Encounter: Payer: Self-pay | Admitting: Obstetrics and Gynecology

## 2020-11-14 VITALS — BP 154/83 | HR 70 | Wt 154.0 lb

## 2020-11-14 DIAGNOSIS — N8110 Cystocele, unspecified: Secondary | ICD-10-CM

## 2020-11-14 DIAGNOSIS — R300 Dysuria: Secondary | ICD-10-CM | POA: Diagnosis not present

## 2020-11-14 DIAGNOSIS — N812 Incomplete uterovaginal prolapse: Secondary | ICD-10-CM | POA: Diagnosis not present

## 2020-11-14 DIAGNOSIS — R350 Frequency of micturition: Secondary | ICD-10-CM

## 2020-11-14 LAB — POCT URINALYSIS DIPSTICK
Bilirubin, UA: NEGATIVE
Glucose, UA: NEGATIVE
Ketones, UA: NEGATIVE
Nitrite, UA: NEGATIVE
Protein, UA: NEGATIVE
Spec Grav, UA: 1.015 (ref 1.010–1.025)
Urobilinogen, UA: NEGATIVE E.U./dL — AB
pH, UA: 5 (ref 5.0–8.0)

## 2020-11-14 NOTE — Progress Notes (Signed)
[facility]   Subjective:     Chief Complaint: Follow-up (Pessary fitting)  History of Present Illness: Abigail [name] is a 85 y.o. female with pelvic organ prolapse who presents today for a pessary fitting.  Recently discharged from hospital for anemia, Hgb 5. Transfused at that time and pessary was removed.  Has some light vaginal bleeding prior to pessary removal, but states she did not have any large clots or large amount of bleeding from the vagina. Pessary has now been out for about 5 days and has not had any bleeding since. Feels like she is emptying her bladder well currently.   Cardiac catheterization was canceled as her symptoms were attributed to anemia. Has cardiology follow up in April.   She feels that she still has a urinary tract infection, having some burning. Urine culture on 10/24/20 showed >100,000 E. Coli.   Pessaries previously tried:  #6 and 7 rings 3in gellhorn  Past Medical History: Patient  has a past medical history of Carotid artery occlusion, Complication of anesthesia, Diverticulitis, Hypertension, Hypothyroid, Intrauterine pessary, and STEMI (ST elevation myocardial infarction) ([place]) (10/26/2019).   Past Surgical History: She  has a past surgical history that includes Back surgery (1980); Elbow surgery; Spine surgery; Fracture surgery; Coronary/Graft Acute MI Revascularization (N/A, 10/26/2019); and LEFT HEART CATH AND CORONARY ANGIOGRAPHY (N/A, 10/26/2019).   Medications: She has a current medication list which includes the following prescription(s): acetaminophen, aspirin ec, atorvastatin, brilinta, cranberry, docusate sodium, estradiol, ferrous sulfate, levothyroxine, lisinopril-hydrochlorothiazide, nitroglycerin, trimo-san, sulfamethoxazole-trimethoprim, and trospium chloride.   Allergies: Patient is allergic to codeine and norvasc [amlodipine].   Social History: Patient  reports that she quit smoking about 34 years ago. She has never used  smokeless tobacco. She reports that she does not drink alcohol and does not use drugs.      Objective:    BP (!) 154/83   Pulse 70   Wt 154 lb (69.9 kg)   BMI 27.28 kg/m  Gen: No apparent distress, A&O x 3. Pelvic Exam: Normal external female genitalia; Bartholin's and Skene's glands normal in appearance; urethral meatus normal in appearance, no urethral masses or discharge.   Speculum exam revealed vaginal atrophy and multiple excoriations at vaginal apex with slow vaginal bleeding. These were treated with silver nitrate and hemostasis with noted. Cervix appeared normal.    POP-Q  3                                            Aa   3                                           Ba  -5                                              C   6                                            Gh  2  Pb  8                                            tvl   -2                                            Ap  -2                                            Bp  -5.5                                              D     PVR bladder scan: After voiding, ultrasound was placed on the patient's abdomen to assess bladder emptying. PVR =52ml  POC urine: moderate leukocytes  Assessment/Plan:    Assessment: Abigail [name] is a 85 y.o. with stage III pelvic organ prolapse   Plan: - We did not fit a pessary today due to presence of vaginal excorations with active slow bleeding, treated with silver nitrate. Advised to use vaginal estrogen 0.5g nightly for two weeks to help with tissue healing.  - She will return next week for a pessary fitting.  - Will send urine for culture due to presence of leukocytes.     [name], MD  Time spent: I spent 25 minutes dedicated to the care of this patient on the date of this encounter to include pre-visit review of records, face-to-face time with the patient and post visit documentation.

## 2020-11-14 NOTE — Patient Instructions (Addendum)
Use a 0.5g of the estrogen nightly for two weeks. You will return in one week for for pessary placement.

## 2020-11-15 ENCOUNTER — Ambulatory Visit (HOSPITAL_COMMUNITY): Admission: RE | Admit: 2020-11-15 | Payer: PPO | Source: Ambulatory Visit | Admitting: Interventional Cardiology

## 2020-11-15 ENCOUNTER — Encounter (HOSPITAL_COMMUNITY): Admission: RE | Payer: Self-pay | Source: Ambulatory Visit

## 2020-11-15 SURGERY — LEFT HEART CATH AND CORONARY ANGIOGRAPHY
Anesthesia: LOCAL

## 2020-11-15 NOTE — Progress Notes (Signed)
[facility]   Subjective:     Chief Complaint: Pessary fitting  History of Present Illness: Abigail [name] is a 85 y.o. female with stage III pelvic organ prolapse who presents today for a pessary fitting.   Has been taking the trospium.  Past Medical History: Patient  has a past medical history of Carotid artery occlusion, Complication of anesthesia, Diverticulitis, Hypertension, Hypothyroid, Intrauterine pessary, and STEMI (ST elevation myocardial infarction) ([place]) (10/26/2019).   Past Surgical History: She  has a past surgical history that includes Back surgery (1980); Elbow surgery; Spine surgery; Fracture surgery; Coronary/Graft Acute MI Revascularization (N/A, 10/26/2019); and LEFT HEART CATH AND CORONARY ANGIOGRAPHY (N/A, 10/26/2019).   Medications: She has a current medication list which includes the following prescription(s): acetaminophen, aspirin ec, atorvastatin, brilinta, cranberry, docusate sodium, estradiol, ferrous sulfate, levothyroxine, lisinopril-hydrochlorothiazide, nitroglycerin, trimo-san, sulfamethoxazole-trimethoprim, and trospium chloride.   Allergies: Patient is allergic to codeine and norvasc [amlodipine].   Social History: Patient  reports that she quit smoking about 34 years ago. She has never used smokeless tobacco. She reports that she does not drink alcohol and does not use drugs.      Objective:    BP 136/73   Pulse 76   Wt 156 lb (70.8 kg)   BMI 27.63 kg/m  Gen: No apparent distress, A&O x 3. Pelvic Exam: Normal external female genitalia; Bartholin's and Skene's glands normal in appearance; urethral meatus normal in appearance, no urethral masses or discharge.   A size #2 donut pessary was fitted. It was comfortable but was expelled with valsalva. A size #3 donut pessary was placed but was too large. A #5 cube pessary was fitted (Lot# [id number] Exp 11/07/24). It was comfortable, stayed in place with valsalva, standing and bending and was an  appropriate size on examination, with one finger fitting between the pessary and the vaginal walls.  Urine leakage was noted with movement after pessary placement.    POP-Q  3                                            Aa   3                                           Ba  -5                                              C   6                                            Gh  2                                            Pb  8  tvl   -2                                            Ap  -2                                            Bp  -5.5                                              D      Assessment/Plan:    Assessment: Ms. Cham is a 85 y.o. with stage III pelvic organ prolapse, OAB and SUI who presents for a pessary fitting.  Plan: POP She was fitted with a #5 cube pessary. She will keep the pessary in place until next visit. She will use estrogen cream nightly for another week, then twice a week after that. She will also [place] the trimosan gel as needed.   OAB - She is currently on Trospium 60mg  and feels this has helped with her urgency and frequency  SUI - She demonstrated leakage today after placement of the pessary. We discussed the option for urethral bulking in office which has less recovery time than an operative procedure. She would like to see how this pessary works for her first and will consider this.  - She states she uses a pad and is more bothered by her prolapse than the leakage at this time.   Follow-up in 2 weeks for a pessary check or sooner as needed.  All questions were answered.    [name], MD  Time spent: I spent 20 minutes dedicated to the care of this patient on the date of this encounter to include pre-visit review of records, face-to-face time with the patient post visit documentation. Additional time was spent for the procedure.

## 2020-11-16 LAB — URINE CULTURE: Organism ID, Bacteria: NO GROWTH

## 2020-11-20 ENCOUNTER — Encounter: Payer: Self-pay | Admitting: Obstetrics and Gynecology

## 2020-11-20 ENCOUNTER — Ambulatory Visit (INDEPENDENT_AMBULATORY_CARE_PROVIDER_SITE_OTHER): Payer: PPO | Admitting: Obstetrics and Gynecology

## 2020-11-20 VITALS — BP 136/73 | HR 76 | Wt 156.0 lb

## 2020-11-20 DIAGNOSIS — N8110 Cystocele, unspecified: Secondary | ICD-10-CM

## 2020-11-20 DIAGNOSIS — N3281 Overactive bladder: Secondary | ICD-10-CM

## 2020-11-20 DIAGNOSIS — N393 Stress incontinence (female) (male): Secondary | ICD-10-CM | POA: Diagnosis not present

## 2020-11-20 NOTE — Patient Instructions (Signed)
Continue to use the estrogen cream nightly for another week. Then after one week, use two times a week at night. You can use trimosan on the other days.

## 2020-11-21 ENCOUNTER — Ambulatory Visit: Payer: Self-pay | Admitting: Obstetrics and Gynecology

## 2020-11-23 NOTE — Consult Note (Signed)
Patient seen and exam done on 11/09/2020:  Admitted for Anemia/Vaginal bleeding with Pessary Subjective: Patient reports tolerating PO.    Objective: I have reviewed patient's vital signs, intake and output, medications and labs.  HPI:  Cystocele with pessary management.  As it was difficult to find a pessary that worked for the patient, the [place] with support were either too large or too small, a Madill 3 in was inserted and felt better to the patient.  When the pessary is in place, patient can empty her bladder.  Without the pessary, difficult to empty her bladder and tendency for Cystitis.  Referred to Dr Lucile Shutters because of the level of difficulty in management.  Seen by Dr Wannetta Sender at her office a couple of weeks ago.  Vitals:   11/10/20 0357 11/10/20 1432  BP: (!) 118/47 (!) 149/55  Pulse: 69 73  Resp: 17 18  Temp: 98.7 F (37.1 C) 97.9 F (36.6 C)  SpO2: 100% 97%    Lab Results  Component Value Date   WBC 5.9 11/10/2020   HGB 8.2 (L) 11/10/2020   HCT 26.3 (L) 11/10/2020   MCV 80.7 11/10/2020   PLT 367 11/10/2020   Gyn exam:  Gelhorn 3 in pessary removed from the vagina.  No active bleeding before removal, but this is a difficult pessary to remove and some red bleeding was present after removal.  No vaginal mucosa erosion or ulceration.   Medications:I have reviewed the patient's current medications.  Assessment/Plan: Gelhorn 3 in pessary removed.  No significant vaginal bleeding before removing the pessary, but some mild red blood after removal given the stretching of the atrophic skin.  Will f/u with Dr Wannetta Sender as an outpatient for further management.  Recommend a bladder catheter until discharge given the likely urinary retention when no pessary is in place.  [name], MD 11/09/2020

## 2020-11-27 ENCOUNTER — Telehealth: Payer: PPO

## 2020-11-28 NOTE — Progress Notes (Signed)
Cardiology Office Note    Date:  12/04/2020   ID:  Abigail [name], DOB 09/09/1935, MRN [id number]   PCP:  [name], [place]  Cardiologist:  Larae Grooms, MD  Advanced Practice Provider:  No care team member to display Electrophysiologist:  None   [phone]   No chief complaint on file.   History of Present Illness:  Abigail [name] is a 85 y.o. female with history of CAD status post STEMI 10/2019 treated with DES to the proximal RCA, mild none obstructive disease involving the LAD and circumflex moderate LAD severe diffuse residual stenosis in the mid and distal RCA favor medical therapy.  Echo 2021 EF 60 to 65% no wall motion abnormalities.  Has also had presyncope and hyponatremia in the past.  Saw Dr. Irish Lack 11/08/2020 and was complaining of shortness of breath and chest pain with walking to the mailbox.  She was severely limited due to frequent UTIs and GYN problems.  He was going to do a cardiac catheterization but then her hemoglobin came back at 5.6 and she was admitted with a GU bleed.  Dr. Irish Lack felt her symptoms were due to GI bleed and canceled the cardiac cath.  Patient comes in for f/u with her daughter. Yesterday she had the pessary taken out by gyn but still bleeding a lot. Patient has had some chest pressure and DOE but nothing like she has had. No longer needing a walker.  Past Medical History:  Diagnosis Date  . Carotid artery occlusion   . Complication of anesthesia    hard to wake up per pt  . Diverticulitis   . Hypertension   . Hypothyroid   . Intrauterine pessary   . STEMI (ST elevation myocardial infarction) ([place]) 10/26/2019   DES RCA    Past Surgical History:  Procedure Laterality Date  . BACK SURGERY  1980  . CORONARY/GRAFT ACUTE MI REVASCULARIZATION N/A 10/26/2019   Procedure: Coronary/Graft Acute MI Revascularization;  Surgeon: Sherren Mocha, MD;  Location: [facility];  Service: Cardiovascular;   Laterality: N/A;  . ELBOW SURGERY     left  . FRACTURE SURGERY    . LEFT HEART CATH AND CORONARY ANGIOGRAPHY N/A 10/26/2019   Procedure: LEFT HEART CATH AND CORONARY ANGIOGRAPHY;  Surgeon: Sherren Mocha, MD;  Location: [facility];  Service: Cardiovascular;  Laterality: N/A;  . SPINE SURGERY      Current Medications: Current Meds  Medication Sig  . acetaminophen (TYLENOL) 325 MG tablet Take 2 tablets (650 mg total) by mouth every 6 (six) hours as needed for mild pain (or Fever >/= 101).  [name] Kitchen aspirin EC 81 MG tablet Take 81 mg by mouth daily.  [name] Kitchen atorvastatin (LIPITOR) 80 MG tablet Take 1 tablet (80 mg total) by mouth daily at 6 PM.  . BRILINTA 90 MG TABS tablet TAKE 1 TABLET BY MOUTH TWICE A DAY  . Cranberry 1000 MG CAPS Take 1,000 mg by mouth daily at 12 noon.  . docusate sodium (COLACE) 100 MG capsule Take 100-200 mg by mouth See admin instructions. Take 100 mg in the morning and 200 mg at bedtime  . estradiol (ESTRACE) 0.1 MG/GM vaginal cream Place 0.5 g vaginally 2 (two) times a week. Place 0.5g twice a week at opening of vagina  . ferrous sulfate 300 (60 Fe) MG/5ML syrup Take 5 mLs (300 mg total) by mouth 2 (two) times daily with a meal.  . levothyroxine (SYNTHROID) 175  MCG tablet TAKE 1 TABLET BY MOUTH EVERY MORNING ON MONDAY - FRIDAY  . lisinopril-hydrochlorothiazide (ZESTORETIC) 20-25 MG tablet Take 0.5 tablets by mouth 3 (three) times a week. Take on Monday Wednesday and Friday only  . nitroGLYCERIN (NITROSTAT) 0.4 MG SL tablet Place 1 tablet (0.4 mg total) under the tongue every 5 (five) minutes x 3 doses as needed for chest pain.  Levin Erp SULFATE VAGINAL (TRIMO-SAN) 0.025 % GEL Place 1 application vaginally as needed.  . Trospium Chloride 60 MG CP24 Take 1 capsule (60 mg total) by mouth daily.     Allergies:   Codeine and Norvasc [amlodipine]   Social History   Socioeconomic History  . Marital status: Single    Spouse name: Not on file  . Number of children:  Not on file  . Years of education: Not on file  . Highest education level: Not on file  Occupational History  . Occupation: semi retired  Tobacco Use  . Smoking status: Former Smoker    Quit date: 04/05/1986    Years since quitting: 34.6  . Smokeless tobacco: Never Used  Vaping Use  . Vaping Use: Never used  Substance and Sexual Activity  . Alcohol use: No  . Drug use: No  . Sexual activity: Not Currently    Birth control/protection: Post-menopausal  Other Topics Concern  . Not on file  Social History Narrative  . Not on file   Social Determinants of Health   Financial Resource Strain: Low Risk   . Difficulty of Paying Living Expenses: Not hard at all  Food Insecurity: No Food Insecurity  . Worried About Charity fundraiser in the Last Year: Never true  . Ran Out of Food in the Last Year: Never true  Transportation Needs: No Transportation Needs  . Lack of Transportation (Medical): No  . Lack of Transportation (Non-Medical): No  Physical Activity: Inactive  . Days of Exercise per Week: 0 days  . Minutes of Exercise per Session: 0 min  Stress: No Stress Concern Present  . Feeling of Stress : Not at all  Social Connections: Not on file     Family History:  The patient's family history includes Hypertension in her mother.   ROS:   Please see the history of present illness.    ROS All other systems reviewed and are negative.   PHYSICAL EXAM:   VS:  BP 120/70   Pulse 77   Ht 5\' 4"  (1.626 m)   Wt 157 lb 3.2 oz (71.3 kg)   SpO2 95%   BMI 26.98 kg/m   Physical Exam  GEN: Well nourished, well developed, in no acute distress  Neck: no JVD, carotid bruits, or masses Cardiac:RRR; no murmurs, rubs, or gallops  Respiratory:  clear to auscultation bilaterally, normal work of breathing GI: soft, nontender, nondistended, + BS Ext: without cyanosis, clubbing, or edema, Good distal pulses bilaterally Neuro:  Alert and Oriented x 3 Psych: euthymic mood, full affect  Wt  Readings from Last 3 Encounters:  12/04/20 157 lb 3.2 oz (71.3 kg)  12/03/20 156 lb (70.8 kg)  11/20/20 156 lb (70.8 kg)      Studies/Labs Reviewed:   EKG:  EKG is not ordered today.    Recent Labs: 05/16/2020: ALT 19 10/03/2020: TSH 0.528 11/10/2020: BUN 20; Creatinine, Ser 1.55; Hemoglobin 8.2; Platelets 367; Potassium 4.9; Sodium 133   Lipid Panel    Component Value Date/Time   CHOL 142 05/16/2020 0913   TRIG 122 05/16/2020 0913  HDL 51 05/16/2020 0913   CHOLHDL 2.8 05/16/2020 0913   CHOLHDL 4.8 10/26/2019 2246   VLDL 40 10/26/2019 2246   LDLCALC 69 05/16/2020 0913    Additional studies/ records that were reviewed today include:     Cath report showed:  "A drug-eluting stent was successfully placed using a SYNERGY XD 2.75X24.  Post intervention, there is a 0% residual stenosis.   1.  Severe single-vessel coronary artery disease with a proximal RCA culprit lesion, treated successfully with primary PCI using a 2.75 x 24 mm Synergy DES 2.  Mild diffuse nonobstructive disease involving the LAD and left circumflex without any significant stenoses 3.  Normal LVEDP 4.  Moderately severe diffuse residual stenosis in the mid and distal RCA, favor initial medical therapy."   Echo 2021: "Left ventricular ejection fraction, by estimation, is 60 to 65%. The  left ventricle has normal function. The left ventricle has no regional  wall motion abnormalities. Left ventricular diastolic parameters were  normal.   2. Right ventricular systolic function is normal. The right ventricular  size is normal.   3. Left atrial size was mildly dilated.   4. The mitral valve is normal in structure and function. No evidence of  mitral valve regurgitation. No evidence of mitral stenosis.   5. The aortic valve is normal in structure and function. Aortic valve  regurgitation is not visualized. No aortic stenosis is present. "    Risk Assessment/Calculations:         ASSESSMENT:    1.  Preoperative clearance   2. Coronary artery disease involving native coronary artery of native heart without angina pectoris   3. Complete uterine prolapse   4. Hyperlipidemia, unspecified hyperlipidemia type   5. Essential hypertension      PLAN:  In order of problems listed above:  Preoperative clearance by Dr. Sherlene Shams for ongoing bleeding and need for sacrospinous hysteropexy and anterior repair scheduled 12/20/20.  Patient still having occasional chest pain dyspnea on exertion but nothing like it had been before.  She continues to bleed and is most likely anemic.  Discussed with Dr. Irish Lack who concurs that she can proceed with the surgery despite her risk below  According to the Revised Cardiac Risk Index (RCRI), her Perioperative Risk of Major Cardiac Event is (%): 6.6  Her Functional Capacity in METs is: 5.07 according to the [facility] Activity Status Index (DASI).     CAD status post STEMI 10/2019 treated with DES to the proximal RCA, mild none obstructive disease involving the LAD and circumflex moderate LAD severe diffuse residual stenosis in the mid and distal RCA favor medical therapy.  She was having recurrent chest pain when seen 11/08/2020 but this was felt secondary to GU bleed with hemoglobin down to 5.6.  Dr. Irish Lack canceled her cath.  She is having occasional chest tightness and dyspnea on exertion but improved since 1 transfusion.  Recheck hemoglobin today.   GU bleed from pessary irritating vaginal mucosa hemoglobin down to 5.6 admitted to the hospital and now requires large pelvic surgery with replacement of pessary.  HTN blood pressure controlled  HLD on Lipitor   Shared Decision Making/Informed Consent        Medication Adjustments/Labs and Tests Ordered: Current medicines are reviewed at length with the patient today.  Concerns regarding medicines are outlined above.  Medication changes, Labs and Tests ordered today are listed in the Patient  Instructions below. Patient Instructions  Medication Instructions:   Your physician recommends that you  continue on your current medications as directed. Please refer to the Current Medication list given to you today.  *If you need a refill on your cardiac medications before your next appointment, please call your pharmacy*   Lab Work:  [facility] AND [facility]  If you have labs (blood work) drawn today and your tests are completely normal, you will receive your results only by: [name] Kitchen MyChart Message (if you have MyChart) OR . A paper copy in the mail If you have any lab test that is abnormal or we need to change your treatment, we will call you to review the results.   Follow-Up:  2 MONTHS IN THE OFFICE WITH DR. Hermelinda Dellen, PA-C  12/04/2020 1:37 PM    [facility] [place], [place], [place]  [address] Phone: [phone]; Fax: [phone]

## 2020-11-30 ENCOUNTER — Telehealth: Payer: Self-pay | Admitting: *Deleted

## 2020-11-30 ENCOUNTER — Ambulatory Visit (INDEPENDENT_AMBULATORY_CARE_PROVIDER_SITE_OTHER): Payer: PPO | Admitting: *Deleted

## 2020-11-30 ENCOUNTER — Other Ambulatory Visit: Payer: Self-pay | Admitting: Obstetrics and Gynecology

## 2020-11-30 ENCOUNTER — Other Ambulatory Visit: Payer: Self-pay

## 2020-11-30 DIAGNOSIS — R350 Frequency of micturition: Secondary | ICD-10-CM

## 2020-11-30 DIAGNOSIS — R300 Dysuria: Secondary | ICD-10-CM

## 2020-11-30 LAB — POCT URINALYSIS DIPSTICK
Appearance: NORMAL
Bilirubin, UA: NEGATIVE
Glucose, UA: NEGATIVE
Ketones, UA: NEGATIVE
Nitrite, UA: NEGATIVE
Protein, UA: NEGATIVE
Spec Grav, UA: 1.02 (ref 1.010–1.025)
Urobilinogen, UA: 0.2 E.U./dL
pH, UA: 5 (ref 5.0–8.0)

## 2020-11-30 MED ORDER — SULFAMETHOXAZOLE-TRIMETHOPRIM 800-160 MG PO TABS: 1.0000 | ORAL_TABLET | Freq: Two times a day (BID) | ORAL | 0 refills | Status: AC

## 2020-11-30 NOTE — Addendum Note (Signed)
Addended by: Gaylyn Rong A on: 11/30/2020 01:48 PM   Modules accepted: Orders

## 2020-11-30 NOTE — Telephone Encounter (Signed)
LMOVM for pt to return call 

## 2020-11-30 NOTE — Progress Notes (Signed)
Pt states that she feels like she has a UTI. Pt came by to leave a urine sample to be sent for culture.

## 2020-11-30 NOTE — Telephone Encounter (Signed)
Pt returning my call. Advised that her urine dip did not show anything different from the last time. Advised that we would send it for culture. Pt states that she is having some vaginal discharge and a prickly sensation in the vaginal area. Advised that a UTI should not cause those symptoms. Advised that she should use the trimo-san jelly that was provided to her with her pessary. Pt states that she is using that. Offered pt an appt to be seen on Monday. Pt accepted.

## 2020-12-02 LAB — URINE CULTURE

## 2020-12-03 ENCOUNTER — Encounter: Payer: Self-pay | Admitting: Obstetrics and Gynecology

## 2020-12-03 ENCOUNTER — Other Ambulatory Visit: Payer: Self-pay

## 2020-12-03 ENCOUNTER — Ambulatory Visit (INDEPENDENT_AMBULATORY_CARE_PROVIDER_SITE_OTHER): Payer: PPO | Admitting: Obstetrics and Gynecology

## 2020-12-03 VITALS — BP 166/74 | HR 69 | Wt 156.0 lb

## 2020-12-03 DIAGNOSIS — N939 Abnormal uterine and vaginal bleeding, unspecified: Secondary | ICD-10-CM | POA: Diagnosis not present

## 2020-12-03 DIAGNOSIS — N8110 Cystocele, unspecified: Secondary | ICD-10-CM

## 2020-12-03 DIAGNOSIS — N952 Postmenopausal atrophic vaginitis: Secondary | ICD-10-CM

## 2020-12-03 NOTE — Progress Notes (Signed)
[name]   Subjective:     Chief Complaint:  Chief Complaint  Patient presents with  . Follow-up  . Pessary Check   History of Present Illness: Abigail [name] is a 85 y.o. female with stage III pelvic organ prolapse and OAB who presents for a pessary check. She is using a size #5 cube pessary. Has seen some brown vaginal discharge. She is using vaginal estrogen about every other day when she thinks of it. Has been using trimosan jelly and that turned a brown color.   Feels that the pessary is overall comfortable, but she can feel that its there. Has been having some difficulty starting her urine stream.   Past Medical History: Patient  has a past medical history of Carotid artery occlusion, Complication of anesthesia, Diverticulitis, Hypertension, Hypothyroid, Intrauterine pessary, and STEMI (ST elevation myocardial infarction) ([place]) (10/26/2019).   Past Surgical History: She  has a past surgical history that includes Back surgery (1980); Elbow surgery; Spine surgery; Fracture surgery; Coronary/Graft Acute MI Revascularization (N/A, 10/26/2019); and LEFT HEART CATH AND CORONARY ANGIOGRAPHY (N/A, 10/26/2019).   Medications: She has a current medication list which includes the following prescription(s): acetaminophen, aspirin ec, atorvastatin, brilinta, cranberry, docusate sodium, estradiol, ferrous sulfate, levothyroxine, lisinopril-hydrochlorothiazide, nitroglycerin, trimo-san, sulfamethoxazole-trimethoprim, sulfamethoxazole-trimethoprim, and trospium chloride.   Allergies: Patient is allergic to codeine and norvasc [amlodipine].   Social History: Patient  reports that she quit smoking about 34 years ago. She has never used smokeless tobacco. She reports that she does not drink alcohol and does not use drugs.      Objective:    Physical Exam: BP (!) 166/74   Pulse 69   Wt 156 lb (70.8 kg)   BMI 27.63 kg/m  Gen: No apparent distress, A&O x 3. Detailed  Urogynecologic Evaluation:  Pelvic Exam: Normal external female genitalia; Bartholin's and Skene's glands normal in appearance; urethral meatus normal in appearance, no urethral masses or discharge. The pessary was noted to be in place. Pessary was very difficult to remove. Speculum exam revealed large amount of dark blood and erosion in the vagina on the right side. This erosion was treated with silver nitrate.  The pessary was not replaced.   POP-Q (11/21/19)  3 Aa  3 Ba  -5 C   6 Gh  2 Pb  8 tvl   -2 Ap  -2 Bp  -5.5 D       Assessment/Plan:    Assessment: Abigail [name] is a 85 y.o. with stage III pelvic organ prolapse here for a pessary check.   Plan: - Pessary stayed in place but caused significant erosions and bleeding, therefore, not replaced today. She is interested in surgery rather than having another pessary. We discussed the least invasive option would be a sacrospinous hysteropexy and anterior repair.  - Advised her to use vaginal estrogen nightly inside of the vagina to help with healing. .  - She has an appointment with her cardiologist tomorrow so will send letter requesting risk stratification for surgery today. After her blood transfusion, it was determined that a cardiac catheterization was no longer needed.  - Previously noted that she demonstrated stress incontinence after pessary placed on 3/29. Therefore, will not need urodynamic testing. We discussed the option of an concomitant incontinence procedure and she would like to do this at the time of prolapse repair.    Will have her return next week to finalize plan for surgery.   Abigail [name]    Time Spent: Time  spent: I spent 40 minutes dedicated to the care of this patient on the date of this encounter to include pre-visit review of records, face-to-face time with the patient and post visit documentation and ordering medication/ testing.

## 2020-12-04 ENCOUNTER — Ambulatory Visit: Payer: PPO | Admitting: Physician Assistant

## 2020-12-04 ENCOUNTER — Encounter: Payer: Self-pay | Admitting: Physician Assistant

## 2020-12-04 VITALS — BP 120/70 | HR 77 | Ht 64.0 in | Wt 157.2 lb

## 2020-12-04 DIAGNOSIS — I10 Essential (primary) hypertension: Secondary | ICD-10-CM

## 2020-12-04 DIAGNOSIS — N813 Complete uterovaginal prolapse: Secondary | ICD-10-CM | POA: Diagnosis not present

## 2020-12-04 DIAGNOSIS — E785 Hyperlipidemia, unspecified: Secondary | ICD-10-CM

## 2020-12-04 DIAGNOSIS — Z01818 Encounter for other preprocedural examination: Secondary | ICD-10-CM | POA: Diagnosis not present

## 2020-12-04 DIAGNOSIS — I2510 Atherosclerotic heart disease of native coronary artery without angina pectoris: Secondary | ICD-10-CM | POA: Diagnosis not present

## 2020-12-04 NOTE — Patient Instructions (Signed)
Medication Instructions:   Your physician recommends that you continue on your current medications as directed. Please refer to the Current Medication list given to you today.  *If you need a refill on your cardiac medications before your next appointment, please call your pharmacy*   Lab Work:  [facility] AND [facility]  If you have labs (blood work) drawn today and your tests are completely normal, you will receive your results only by: [name] Kitchen MyChart Message (if you have MyChart) OR . A paper copy in the mail If you have any lab test that is abnormal or we need to change your treatment, we will call you to review the results.   Follow-Up:  2 MONTHS IN THE OFFICE WITH DR. VARANASI

## 2020-12-05 LAB — BASIC METABOLIC PANEL
BUN/Creatinine Ratio: 13 (ref 12–28)
BUN: 24 mg/dL (ref 8–27)
CO2: 19 mmol/L — ABNORMAL LOW (ref 20–29)
Calcium: 8.9 mg/dL (ref 8.7–10.3)
Chloride: 101 mmol/L (ref 96–106)
Creatinine, Ser: 1.78 mg/dL — ABNORMAL HIGH (ref 0.57–1.00)
Glucose: 101 mg/dL — ABNORMAL HIGH (ref 65–99)
Potassium: 5.1 mmol/L (ref 3.5–5.2)
Sodium: 134 mmol/L (ref 134–144)
eGFR: 28 mL/min/{1.73_m2} — ABNORMAL LOW (ref >59)

## 2020-12-05 LAB — CBC
Hematocrit: 29.5 % — ABNORMAL LOW (ref 34.0–46.6)
Hemoglobin: 9.3 g/dL — ABNORMAL LOW (ref 11.1–15.9)
MCH: 27.2 pg (ref 26.6–33.0)
MCHC: 31.5 g/dL (ref 31.5–35.7)
MCV: 86 fL (ref 79–97)
Platelets: 349 10*3/uL (ref 150–450)
RBC: 3.42 x10E6/uL — ABNORMAL LOW (ref 3.77–5.28)
RDW: 21.7 % — ABNORMAL HIGH (ref 11.7–15.4)
WBC: 7 10*3/uL (ref 3.4–10.8)

## 2020-12-07 ENCOUNTER — Ambulatory Visit: Payer: PPO | Admitting: Obstetrics and Gynecology

## 2020-12-10 NOTE — Progress Notes (Addendum)
[facility] Return Visit  SUBJECTIVE  History of Present Illness: CLOIE WOODEN is a 85 y.o. female seen in follow-up to finalize plan for surgery. She was seen by cardiology, stated her dyspnea on exertion has improved. Has a perioperative risk of cardiac event of 6.6%.  At her initial visit with placement of her pessary, she was noted to demonstrate stress incontinence. Pessary has been left out since last visit and she is using vaginal estrogen cream. Has had a small amount of vaginal bleeding. At prior visits dating back to 2016, further uterine prolapse was noted, with cervix extending several centimeters out of vagina.   Has also been taking the tropsium 60mg  daily for urgency symptoms.   Past Medical History: Patient  has a past medical history of Carotid artery occlusion, Complication of anesthesia, Diverticulitis, Hypertension, Hypothyroid, Intrauterine pessary, and STEMI (ST elevation myocardial infarction) ([place]) (10/26/2019).   Past Surgical History: She  has a past surgical history that includes Back surgery (1980); Elbow surgery; Spine surgery; Fracture surgery; Coronary/Graft Acute MI Revascularization (N/A, 10/26/2019); and LEFT HEART CATH AND CORONARY ANGIOGRAPHY (N/A, 10/26/2019).   Medications: She has a current medication list which includes the following prescription(s): acetaminophen, acetaminophen, aspirin ec, atorvastatin, brilinta, cranberry, cyclobenzaprine, docusate sodium, estradiol, ferrous sulfate, levothyroxine, lisinopril-hydrochlorothiazide, nitroglycerin, oxycodone, trimo-san, polyethylene glycol powder, and trospium chloride.   Allergies: Patient is allergic to codeine and norvasc [amlodipine].   Social History: Patient  reports that she quit smoking about 34 years ago. She has never used smokeless tobacco. She reports that she does not drink alcohol and does not use drugs.      OBJECTIVE     Physical Exam: Vitals:   12/11/20 0942  BP: (!)  176/75  Pulse: 99  Weight: 157 lb (71.2 kg)   Gen: No apparent distress, A&O x 3.  Detailed Urogynecologic Evaluation:  Deferred. Prior exam showed:  POP-Q (11/21/19)  3 Aa  3 Ba  -5 C   6 Gh  2 Pb  8 tvl   -2 Ap  -2 Bp  -5.5 D       ASSESSMENT AND PLAN    Ms. Kauffmann is a 85 y.o. with:  1. Pre-op evaluation     Plan for surgery: Exam under anesthesia, sacrospinous hysteropexy, anterior and posterior repair with perineorrhaphy, midurethral sling cystoscopy, possible [place] Colpocleisis  - We reviewed the patient's specific anatomic and functional findings, with the assistance of diagrams, and together finalized the above procedure. The planned surgical procedures were discussed along with the surgical risks outlined below, which were also provided on a detailed handout. Additional treatment options including expectant management, conservative management, medical management were discussed where appropriate.  We reviewed the benefits and risks of each treatment option.   - We discussed that since she previously showed more advanced prolapse several years ago, then its possible that the pessary has changed her exam, but when she is under anesthesia, the prolapse could revert back. Therefore, if she has complete prolapse at the time of surgery, would recommend a [facility] instead of vaginal reconstruction. She understands that she would not be able to have intercourse after this procedure. We reviewed that she had a normal pelvic ultrasound so it is reasonable to keep the  uterus in place, although there is a risk of future need for hysterectomy.   General Surgical Risks: For all procedures, there are risks of bleeding, infection, damage to surrounding organs including but not limited to bowel, bladder, blood vessels,  ureters and nerves, and need for further surgery if an injury were to occur. These risks are all low with minimally invasive surgery.   There are risks of numbness and weakness at any body site or buttock/rectal pain.  It is possible that baseline pain can be worsened by surgery, either with or without mesh. If surgery is vaginal, there is also a low risk of possible conversion to laparoscopy or open abdominal incision where indicated. Very rare risks include blood transfusion, blood clot, heart attack, pneumonia, or death. We discussed that based on her cardiac evaluation, she is at increased risk for a cardiac event but benefit of this procedure outweighs the risks.   There is also a risk of short-term postoperative urinary retention with need to use a catheter. About half of patients need to go home from surgery with a catheter, which is then later removed in the office. The risk of long-term need for a catheter is very low. There is also a risk of worsening of overactive bladder.   Sling: The effectiveness of a midurethral vaginal mesh sling is approximately 85%, and thus, there will be times when you may leak urine after surgery, especially if your bladder is full or if you have a strong cough. There is a balance between making the sling tight enough to treat your leakage but not too tight so that you have long-term difficulty emptying your bladder. A mesh sling will not directly treat overactive bladder/urge incontinence and may worsen it.  There is an FDA safety notification on vaginal mesh procedures for prolapse but NOT mesh slings. We have extensive experience and training with mesh placement and we have close postoperative follow up to identify any  potential complications from mesh. It is important to realize that this mesh is a permanent implant that cannot be easily removed. There are rare risks of mesh exposure (2-4%), pain with intercourse (0-7%), and infection (<1%). The risk of mesh exposure if more likely in a woman with risks for poor healing (prior radiation, poorly controlled diabetes, or immunocompromised). The risk of new or worsened chronic pain after mesh implant is more common in women with baseline chronic pain and/or poorly controlled anxiety or depression. Approximately 2-4% of patients will experience longer-term post-operative voiding dysfunction that may require surgical revision of the sling. We also reviewed that postoperatively, her stream may not be as strong as before surgery.   Prolapse (with or without mesh): Risk factors for surgical failure  include things that put pressure on your pelvis and the surgical repair, including obesity, chronic cough, and heavy lifting or straining (including lifting children or adults, straining on the toilet, or lifting heavy objects such as furniture or anything weighing >25 lbs. Risks of recurrence is 20-30% with vaginal native tissue repair and a less than 10% with sacrocolpopexy with mesh.     Today we reviewed pre-operative preparation, peri-operative expectations, and post-operative instructions/recovery.  She was provided with instructional handouts. She understands not to take aspirin (>81mg ) or NSAIDs 7 days prior to surgery. Prescriptions provided for: Oxycodone 5mg , Tylenol 500mg , Miralax and flexeril 10mg . She cannot take Ibuprofen. These prescriptions were sent today to pick up prior to surgery.   - Medical clearance: required Received clearance from [facility], stating moderate risk of cardiac event of 6.6% - Anticoagulant use: No - Medicaid Hysterectomy form: No - Accepts blood transfusion: Yes - Expected length of stay: outpatient    [name],  MD  Time spent: I spent 50  minutes dedicated to the care of this patient on the date of this encounter to include pre-visit review of records, face-to-face time with the patient discussing surgery and post visit documentation and ordering medication/ testing.

## 2020-12-11 ENCOUNTER — Encounter: Payer: Self-pay | Admitting: Obstetrics and Gynecology

## 2020-12-11 ENCOUNTER — Ambulatory Visit (INDEPENDENT_AMBULATORY_CARE_PROVIDER_SITE_OTHER): Payer: PPO | Admitting: Obstetrics and Gynecology

## 2020-12-11 ENCOUNTER — Other Ambulatory Visit: Payer: Self-pay

## 2020-12-11 VITALS — BP 176/75 | HR 99 | Wt 157.0 lb

## 2020-12-11 DIAGNOSIS — N816 Rectocele: Secondary | ICD-10-CM

## 2020-12-11 DIAGNOSIS — N812 Incomplete uterovaginal prolapse: Secondary | ICD-10-CM

## 2020-12-11 DIAGNOSIS — Z01818 Encounter for other preprocedural examination: Secondary | ICD-10-CM | POA: Diagnosis not present

## 2020-12-11 DIAGNOSIS — N8110 Cystocele, unspecified: Secondary | ICD-10-CM

## 2020-12-11 MED ORDER — OXYCODONE HCL 5 MG PO TABS: 5.0000 mg | ORAL_TABLET | ORAL | 0 refills | Status: DC | PRN

## 2020-12-11 MED ORDER — CYCLOBENZAPRINE HCL 10 MG PO TABS: 10.0000 mg | ORAL_TABLET | Freq: Three times a day (TID) | ORAL | 1 refills | Status: DC | PRN

## 2020-12-11 MED ORDER — POLYETHYLENE GLYCOL 3350 17 GM/SCOOP PO POWD: 1.0000 | Freq: Every day | ORAL | 0 refills | Status: DC

## 2020-12-11 MED ORDER — ACETAMINOPHEN 500 MG PO TABS: 500.0000 mg | ORAL_TABLET | Freq: Four times a day (QID) | ORAL | 0 refills | Status: DC | PRN

## 2020-12-11 NOTE — H&P (Signed)
[facility] Pre-Operative H&P  Subjective Chief Complaint: Abigail [name] presents for a preoperative encounter.   History of Present Illness: Abigail [name] is a 85 y.o. female who presents for preoperative visit.  She is scheduled to undergo Exam under anesthesia, sacrospinous hysteropexy, anterior and posterior repair with perineorrhaphy, midurethral sling, cystoscopy, possible LeFort Colpocleisis on 12/20/20.  Her symptoms include vaginal bulge and incontinence, and she was was found to have Stage III anterior, Stage II posterior, Stage I apical prolapse. Prior exams showed stage IV prolapse.  [name] Kitchen    Past Medical History:  Diagnosis Date  . Carotid artery occlusion   . Complication of anesthesia    hard to wake up per pt  . Diverticulitis   . Hypertension   . Hypothyroid   . Intrauterine pessary   . STEMI (ST elevation myocardial infarction) ([place]) 10/26/2019   DES RCA     Past Surgical History:  Procedure Laterality Date  . BACK SURGERY  1980  . CORONARY/GRAFT ACUTE MI REVASCULARIZATION N/A 10/26/2019   Procedure: Coronary/Graft Acute MI Revascularization;  Surgeon: Sherren Mocha, MD;  Location: [facility];  Service: Cardiovascular;  Laterality: N/A;  . ELBOW SURGERY     left  . FRACTURE SURGERY    . LEFT HEART CATH AND CORONARY ANGIOGRAPHY N/A 10/26/2019   Procedure: LEFT HEART CATH AND CORONARY ANGIOGRAPHY;  Surgeon: Sherren Mocha, MD;  Location: [facility];  Service: Cardiovascular;  Laterality: N/A;  . SPINE SURGERY      is allergic to codeine and norvasc [amlodipine].   Family History  Problem Relation Age of Onset  . Hypertension Mother     Social History   Tobacco Use  . Smoking status: Former Smoker    Quit date: 04/05/1986    Years since quitting: 34.7  . Smokeless tobacco: Never Used  Vaping Use  . Vaping Use: Never used  Substance Use Topics  . Alcohol use: No  . Drug use: No     Review of Systems was negative for a  full 10 system review except as noted in the History of Present Illness.  No current facility-administered medications for this encounter.  Current Outpatient Medications:  .  acetaminophen (TYLENOL) 325 MG tablet, Take 2 tablets (650 mg total) by mouth every 6 (six) hours as needed for mild pain (or Fever >/= 101)., Disp: , Rfl:  .  acetaminophen (TYLENOL) 500 MG tablet, Take 1 tablet (500 mg total) by mouth every 6 (six) hours as needed (pain)., Disp: 30 tablet, Rfl: 0 .  aspirin EC 81 MG tablet, Take 81 mg by mouth daily., Disp: , Rfl:  .  atorvastatin (LIPITOR) 80 MG tablet, Take 1 tablet (80 mg total) by mouth daily at 6 PM., Disp: 90 tablet, Rfl: 3 .  BRILINTA 90 MG TABS tablet, TAKE 1 TABLET BY MOUTH TWICE A DAY, Disp: 60 tablet, Rfl: 11 .  Cranberry 1000 MG CAPS, Take 1,000 mg by mouth daily at 12 noon., Disp: , Rfl:  .  cyclobenzaprine (FLEXERIL) 10 MG tablet, Take 1 tablet (10 mg total) by mouth 3 (three) times daily as needed for muscle spasms., Disp: 30 tablet, Rfl: 1 .  docusate sodium (COLACE) 100 MG capsule, Take 100-200 mg by mouth See admin instructions. Take 100 mg in the morning and 200 mg at bedtime, Disp: , Rfl:  .  estradiol (ESTRACE) 0.1 MG/GM vaginal cream, Place 0.5 g vaginally 2 (two) times a week. Place 0.5g twice a week  at opening of vagina, Disp: 30 g, Rfl: 11 .  ferrous sulfate 300 (60 Fe) MG/5ML syrup, Take 5 mLs (300 mg total) by mouth 2 (two) times daily with a meal., Disp: 150 mL, Rfl: 3 .  levothyroxine (SYNTHROID) 175 MCG tablet, TAKE 1 TABLET BY MOUTH EVERY MORNING ON MONDAY - FRIDAY, Disp: 90 tablet, Rfl: 1 .  lisinopril-hydrochlorothiazide (ZESTORETIC) 20-25 MG tablet, Take 0.5 tablets by mouth 3 (three) times a week. Take on Monday Wednesday and Friday only, Disp: 90 tablet, Rfl: 1 .  nitroGLYCERIN (NITROSTAT) 0.4 MG SL tablet, Place 1 tablet (0.4 mg total) under the tongue every 5 (five) minutes x 3 doses as needed for chest pain., Disp: 25 tablet, Rfl: 3 .   oxyCODONE (OXY IR/ROXICODONE) 5 MG immediate release tablet, Take 1 tablet (5 mg total) by mouth every 4 (four) hours as needed for severe pain., Disp: 20 tablet, Rfl: 0 .  OXYQUINOLONE SULFATE VAGINAL (TRIMO-SAN) 0.025 % GEL, Place 1 application vaginally as needed., Disp: 30 g, Rfl: 5 .  polyethylene glycol powder (GLYCOLAX/MIRALAX) 17 GM/SCOOP powder, Take 255 g by mouth daily. Drink 17g (1 scoop) dissolved in water per day., Disp: 255 g, Rfl: 0 .  Trospium Chloride 60 MG CP24, Take 1 capsule (60 mg total) by mouth daily., Disp: 30 capsule, Rfl: 5   Objective   Previous Pelvic Exam showed:  POP-Q(11/21/19)  3 Aa  3 Ba  -5 C   6 Gh  2 Pb  8 tvl   -2 Ap  -2 Bp  -5.5 D      Assessment/ Plan  Assessment: The patient is a 85 y.o. year old with pelvic organ prolapse and incontinence  Plan:  Exam under anesthesia, sacrospinous hysteropexy, anterior and posterior repair with perineorrhaphy, midurethral sling, cystoscopy, possible LeFort Colpocleisis   [name], MD

## 2020-12-17 ENCOUNTER — Other Ambulatory Visit: Payer: Self-pay

## 2020-12-17 ENCOUNTER — Encounter (HOSPITAL_BASED_OUTPATIENT_CLINIC_OR_DEPARTMENT_OTHER): Payer: Self-pay | Admitting: Obstetrics and Gynecology

## 2020-12-17 ENCOUNTER — Telehealth: Payer: Self-pay | Admitting: *Deleted

## 2020-12-17 ENCOUNTER — Other Ambulatory Visit (HOSPITAL_COMMUNITY)
Admission: RE | Admit: 2020-12-17 | Discharge: 2020-12-17 | Disposition: A | Discharge status: Home / Self Care | Payer: PPO | Source: Ambulatory Visit | Attending: Obstetrics and Gynecology | Admitting: Obstetrics and Gynecology

## 2020-12-17 DIAGNOSIS — Z01812 Encounter for preprocedural laboratory examination: Secondary | ICD-10-CM | POA: Insufficient documentation

## 2020-12-17 DIAGNOSIS — Z20822 Contact with and (suspected) exposure to covid-19: Secondary | ICD-10-CM | POA: Diagnosis not present

## 2020-12-17 NOTE — Telephone Encounter (Signed)
Pt called requesting to have her niece added to her release of information. Advised that was a form that had to be signed in person and she could fill that out the morning of her surgery. Pt verbalized understanding.

## 2020-12-17 NOTE — Progress Notes (Addendum)
Spoke w/ via phone for pre-op interview---PT Lab needs dos---- I STAT, t & s              Lab results------SEE BELOW COVID test ------12-17-2020 1345 Arrive at -------503 am 12-20-2020 NPO after MN NO Solid Food.  Clear liquids from MN until---815 am then npo Med rec completed Medications to take morning of surgery -----levothyroxine, trospium chloride Diabetic medication -----n/a Patient instructed to bring photo id and insurance card day of surgery Patient aware to have Driver (ride ) / caregiver  Son Nicole Kindred Plake son will stay   for 24 hours after surgery  Patient Special Instructions -----none Pre-Op special Istructions -----none Patient verbalized understanding of instructions that were given at this phone interview. Patient denies shortness of breath, chest pain, fever, cough at this phone interview.  Anesthesia: cad, stemi 10-26-2019 with des to proximal rca - mild nonobstructive diseaase involving lad and circumflex, moderate lad severe diffuse residual stenosis in mid and distal rca. ech0 2021 ef 60 to 65 % no wall motion abnormalities, pt denies any cardiac s & s and has not used nitro recently. Pt does housework and can climb stairs without difficulty.    Cardiologist: dr Irish Lack, cardiac clearance Cassell Clement michele lenze pa 12-04-2020 Pt instructed by dr schroeder to stop brinilta 12-16-2020 per dr Irish Lack office Pt instructed to take 81 mg aspirin last dose 81 mg aspirin will be 12-19-2020 per dr schroeder ekg 11-08-2020 epic Chest xray 12-01-2019 epic Cardiac cath 10-26-2019 epic Echo 10-27-2019 epic  Note send epic inbasket to dr schroeder, last hemaglobin was 9.3 on 12-04-2020, pt had 1 unit blood given 11-09-2020, does dr schroeder want cbc repeated day of surgery?  Addendum: cbc not needed day of surgery  per dr schroeder

## 2020-12-18 LAB — SARS CORONAVIRUS 2 (TAT 6-24 HRS): SARS Coronavirus 2: NEGATIVE

## 2020-12-20 ENCOUNTER — Ambulatory Visit (HOSPITAL_BASED_OUTPATIENT_CLINIC_OR_DEPARTMENT_OTHER): Payer: PPO | Admitting: Anesthesiology

## 2020-12-20 ENCOUNTER — Encounter (HOSPITAL_BASED_OUTPATIENT_CLINIC_OR_DEPARTMENT_OTHER)
Admission: RE | Disposition: A | Discharge status: Home / Self Care | Payer: Self-pay | Source: Ambulatory Visit | Attending: Obstetrics and Gynecology

## 2020-12-20 ENCOUNTER — Ambulatory Visit (HOSPITAL_BASED_OUTPATIENT_CLINIC_OR_DEPARTMENT_OTHER)
Admission: RE | Admit: 2020-12-20 | Discharge: 2020-12-20 | Disposition: A | Discharge status: Home / Self Care | Payer: PPO | Source: Ambulatory Visit | Attending: Obstetrics and Gynecology | Admitting: Obstetrics and Gynecology

## 2020-12-20 ENCOUNTER — Encounter (HOSPITAL_BASED_OUTPATIENT_CLINIC_OR_DEPARTMENT_OTHER): Payer: Self-pay | Admitting: Obstetrics and Gynecology

## 2020-12-20 ENCOUNTER — Telehealth: Payer: Self-pay | Admitting: Obstetrics and Gynecology

## 2020-12-20 DIAGNOSIS — N812 Incomplete uterovaginal prolapse: Secondary | ICD-10-CM | POA: Diagnosis not present

## 2020-12-20 DIAGNOSIS — Z7902 Long term (current) use of antithrombotics/antiplatelets: Secondary | ICD-10-CM | POA: Diagnosis not present

## 2020-12-20 DIAGNOSIS — D62 Acute posthemorrhagic anemia: Secondary | ICD-10-CM | POA: Diagnosis not present

## 2020-12-20 DIAGNOSIS — Z79899 Other long term (current) drug therapy: Secondary | ICD-10-CM | POA: Insufficient documentation

## 2020-12-20 DIAGNOSIS — Z7982 Long term (current) use of aspirin: Secondary | ICD-10-CM | POA: Diagnosis not present

## 2020-12-20 DIAGNOSIS — E871 Hypo-osmolality and hyponatremia: Secondary | ICD-10-CM | POA: Diagnosis not present

## 2020-12-20 DIAGNOSIS — Z87891 Personal history of nicotine dependence: Secondary | ICD-10-CM | POA: Diagnosis not present

## 2020-12-20 DIAGNOSIS — Z79890 Hormone replacement therapy (postmenopausal): Secondary | ICD-10-CM | POA: Diagnosis not present

## 2020-12-20 DIAGNOSIS — Z955 Presence of coronary angioplasty implant and graft: Secondary | ICD-10-CM | POA: Diagnosis not present

## 2020-12-20 DIAGNOSIS — N182 Chronic kidney disease, stage 2 (mild): Secondary | ICD-10-CM | POA: Diagnosis present

## 2020-12-20 DIAGNOSIS — N393 Stress incontinence (female) (male): Secondary | ICD-10-CM | POA: Insufficient documentation

## 2020-12-20 HISTORY — PX: BLADDER SUSPENSION: SHX72

## 2020-12-20 HISTORY — PX: CYSTOCELE REPAIR: SHX163

## 2020-12-20 HISTORY — DX: Atherosclerotic heart disease of native coronary artery without angina pectoris: I25.10

## 2020-12-20 HISTORY — PX: ANTERIOR AND POSTERIOR REPAIR WITH SACROSPINOUS FIXATION: SHX6536

## 2020-12-20 HISTORY — DX: Constipation, unspecified: K59.00

## 2020-12-20 HISTORY — DX: Unspecified osteoarthritis, unspecified site: M19.90

## 2020-12-20 HISTORY — DX: Anesthesia of skin: R20.0

## 2020-12-20 HISTORY — PX: CYSTOSCOPY: SHX5120

## 2020-12-20 HISTORY — DX: Prediabetes: R73.03

## 2020-12-20 HISTORY — DX: Chronic kidney disease, unspecified: N18.9

## 2020-12-20 HISTORY — DX: Scoliosis, unspecified: M41.9

## 2020-12-20 HISTORY — DX: Presence of dental prosthetic device (complete) (partial): Z97.2

## 2020-12-20 HISTORY — DX: Presence of spectacles and contact lenses: Z97.3

## 2020-12-20 HISTORY — DX: Personal history of other medical treatment: Z92.89

## 2020-12-20 HISTORY — DX: Anemia, unspecified: D64.9

## 2020-12-20 HISTORY — DX: Complete uterovaginal prolapse: N81.3

## 2020-12-20 LAB — POCT I-STAT, CHEM 8
BUN: 25 mg/dL — ABNORMAL HIGH (ref 8–23)
Calcium, Ion: 1.21 mmol/L (ref 1.15–1.40)
Chloride: 103 mmol/L (ref 98–111)
Creatinine, Ser: 1.3 mg/dL — ABNORMAL HIGH (ref 0.44–1.00)
Glucose, Bld: 102 mg/dL — ABNORMAL HIGH (ref 70–99)
HCT: 35 % — ABNORMAL LOW (ref 36.0–46.0)
Hemoglobin: 11.9 g/dL — ABNORMAL LOW (ref 12.0–15.0)
Potassium: 4.8 mmol/L (ref 3.5–5.1)
Sodium: 138 mmol/L (ref 135–145)
TCO2: 26 mmol/L (ref 22–32)

## 2020-12-20 LAB — TYPE AND SCREEN
ABO/RH(D): B POS
Antibody Screen: NEGATIVE

## 2020-12-20 SURGERY — ANTERIOR AND POSTERIOR REPAIR WITH SACROSPINOUS FIXATION
Anesthesia: General | Site: Vagina

## 2020-12-20 MED ORDER — LIDOCAINE 2% (20 MG/ML) 5 ML SYRINGE
INTRAMUSCULAR | Status: AC
  Filled 2020-12-20: qty 5

## 2020-12-20 MED ORDER — APREPITANT 40 MG PO CAPS: 40.0000 mg | ORAL_CAPSULE | Freq: Once | ORAL | Status: DC

## 2020-12-20 MED ORDER — CEFAZOLIN SODIUM-DEXTROSE 2-4 GM/100ML-% IV SOLN
INTRAVENOUS | Status: AC
  Filled 2020-12-20: qty 100

## 2020-12-20 MED ORDER — EPHEDRINE SULFATE-NACL 50-0.9 MG/10ML-% IV SOSY
PREFILLED_SYRINGE | INTRAVENOUS | Status: DC | PRN
  Administered 2020-12-20 (×4): 10 mg via INTRAVENOUS

## 2020-12-20 MED ORDER — FENTANYL CITRATE (PF) 100 MCG/2ML IJ SOLN
INTRAMUSCULAR | Status: DC | PRN
  Administered 2020-12-20: 25 ug via INTRAVENOUS
  Administered 2020-12-20: 50 ug via INTRAVENOUS
  Administered 2020-12-20 (×2): 25 ug via INTRAVENOUS

## 2020-12-20 MED ORDER — OXYCODONE HCL 5 MG PO TABS
ORAL_TABLET | ORAL | Status: AC
  Filled 2020-12-20: qty 1

## 2020-12-20 MED ORDER — DEXAMETHASONE SODIUM PHOSPHATE 10 MG/ML IJ SOLN
INTRAMUSCULAR | Status: DC | PRN
  Administered 2020-12-20: 5 mg via INTRAVENOUS

## 2020-12-20 MED ORDER — ONDANSETRON HCL 4 MG/2ML IJ SOLN
INTRAMUSCULAR | Status: DC | PRN
  Administered 2020-12-20: 4 mg via INTRAVENOUS

## 2020-12-20 MED ORDER — LIDOCAINE 2% (20 MG/ML) 5 ML SYRINGE
INTRAMUSCULAR | Status: DC | PRN
  Administered 2020-12-20: 80 mg via INTRAVENOUS

## 2020-12-20 MED ORDER — ACETAMINOPHEN 500 MG PO TABS
ORAL_TABLET | ORAL | Status: AC
  Filled 2020-12-20: qty 2

## 2020-12-20 MED ORDER — FENTANYL CITRATE (PF) 100 MCG/2ML IJ SOLN
INTRAMUSCULAR | Status: AC
  Filled 2020-12-20: qty 2

## 2020-12-20 MED ORDER — ACETAMINOPHEN 500 MG PO TABS
1000.0000 mg | ORAL_TABLET | ORAL | Status: AC
  Administered 2020-12-20: 1000 mg via ORAL

## 2020-12-20 MED ORDER — ONDANSETRON HCL 4 MG/2ML IJ SOLN
INTRAMUSCULAR | Status: AC
  Filled 2020-12-20: qty 2

## 2020-12-20 MED ORDER — ARTIFICIAL TEARS OPHTHALMIC OINT
TOPICAL_OINTMENT | OPHTHALMIC | Status: AC
  Filled 2020-12-20: qty 3.5

## 2020-12-20 MED ORDER — DEXAMETHASONE SODIUM PHOSPHATE 10 MG/ML IJ SOLN
INTRAMUSCULAR | Status: AC
  Filled 2020-12-20: qty 1

## 2020-12-20 MED ORDER — CEFAZOLIN SODIUM-DEXTROSE 2-4 GM/100ML-% IV SOLN
2.0000 g | INTRAVENOUS | Status: AC
  Administered 2020-12-20: 2 g via INTRAVENOUS

## 2020-12-20 MED ORDER — PROPOFOL 10 MG/ML IV BOLUS
INTRAVENOUS | Status: DC | PRN
  Administered 2020-12-20: 130 mg via INTRAVENOUS

## 2020-12-20 MED ORDER — APREPITANT 40 MG PO CAPS
ORAL_CAPSULE | ORAL | Status: AC
  Filled 2020-12-20: qty 1

## 2020-12-20 MED ORDER — SODIUM CHLORIDE 0.9 % IR SOLN
Status: DC | PRN
  Administered 2020-12-20: 400 mL

## 2020-12-20 MED ORDER — PROPOFOL 10 MG/ML IV BOLUS
INTRAVENOUS | Status: AC
  Filled 2020-12-20: qty 20

## 2020-12-20 MED ORDER — PHENAZOPYRIDINE HCL 100 MG PO TABS
ORAL_TABLET | ORAL | Status: AC
  Filled 2020-12-20: qty 2

## 2020-12-20 MED ORDER — EPHEDRINE 5 MG/ML INJ
INTRAVENOUS | Status: AC
  Filled 2020-12-20: qty 10

## 2020-12-20 MED ORDER — PHENAZOPYRIDINE HCL 100 MG PO TABS
200.0000 mg | ORAL_TABLET | ORAL | Status: AC
  Administered 2020-12-20: 200 mg via ORAL

## 2020-12-20 MED ORDER — APREPITANT 40 MG PO CAPS
40.0000 mg | ORAL_CAPSULE | Freq: Once | ORAL | Status: AC
  Administered 2020-12-20: 40 mg via ORAL

## 2020-12-20 MED ORDER — LACTATED RINGERS IV SOLN: INTRAVENOUS | Status: DC

## 2020-12-20 MED ORDER — OXYCODONE HCL 5 MG PO TABS
5.0000 mg | ORAL_TABLET | Freq: Once | ORAL | Status: AC
  Administered 2020-12-20: 5 mg via ORAL

## 2020-12-20 MED ORDER — FENTANYL CITRATE (PF) 100 MCG/2ML IJ SOLN
25.0000 ug | INTRAMUSCULAR | Status: DC | PRN
  Administered 2020-12-20 (×2): 25 ug via INTRAVENOUS

## 2020-12-20 MED ORDER — LIDOCAINE-EPINEPHRINE 1 %-1:100000 IJ SOLN
INTRAMUSCULAR | Status: DC | PRN
  Administered 2020-12-20: 30 mL

## 2020-12-20 MED ORDER — ONDANSETRON HCL 4 MG/2ML IJ SOLN: 4.0000 mg | Freq: Once | INTRAMUSCULAR | Status: DC | PRN

## 2020-12-20 SURGICAL SUPPLY — 61 items
ADH SKN CLS APL DERMABOND .7 (GAUZE/BANDAGES/DRESSINGS) ×3
AGENT HMST KT MTR STRL THRMB (HEMOSTASIS)
APL ESCP 34 STRL LF DISP (HEMOSTASIS)
APL PRP STRL LF DISP 70% ISPRP (MISCELLANEOUS)
APPLICATOR SURGIFLO ENDO (HEMOSTASIS) IMPLANT
BLADE SURG 15 STRL LF DISP TIS (BLADE) ×3 IMPLANT
BLADE SURG 15 STRL SS (BLADE) ×4
BNDG GAUZE ELAST 4 BULKY (GAUZE/BANDAGES/DRESSINGS) IMPLANT
CANISTER SUCT 3000ML PPV (MISCELLANEOUS) IMPLANT
CATH URET 5FR 28IN OPEN ENDED (CATHETERS) ×4 IMPLANT
CHLORAPREP W/TINT 26 (MISCELLANEOUS) IMPLANT
COVER WAND RF STERILE (DRAPES) ×4 IMPLANT
DECANTER SPIKE VIAL GLASS SM (MISCELLANEOUS) ×8 IMPLANT
DERMABOND ADVANCED (GAUZE/BANDAGES/DRESSINGS) ×1
DERMABOND ADVANCED .7 DNX12 (GAUZE/BANDAGES/DRESSINGS) ×3 IMPLANT
DEVICE CAPIO SLIM SINGLE (INSTRUMENTS) ×4 IMPLANT
ELECT REM PT RETURN 9FT ADLT (ELECTROSURGICAL) ×4
ELECTRODE REM PT RTRN 9FT ADLT (ELECTROSURGICAL) ×3 IMPLANT
GAUZE PACKING 2X5 YD STRL (GAUZE/BANDAGES/DRESSINGS) IMPLANT
GLOVE SURG ENC MOIS LTX SZ6 (GLOVE) ×4 IMPLANT
GLOVE SURG LTX SZ6 (GLOVE) ×4 IMPLANT
GLOVE SURG UNDER POLY LF SZ6.5 (GLOVE) ×4 IMPLANT
GLOVE SURG UNDER POLY LF SZ7 (GLOVE) ×4 IMPLANT
GOWN STRL REUS W/TWL LRG LVL3 (GOWN DISPOSABLE) ×12 IMPLANT
GUIDEWIRE STR DUAL SENSOR (WIRE) ×4 IMPLANT
HIBICLENS CHG 4% 4OZ (MISCELLANEOUS) ×4 IMPLANT
IV NS 1000ML (IV SOLUTION) ×4
IV NS 1000ML BAXH (IV SOLUTION) ×3 IMPLANT
KIT TURNOVER CYSTO (KITS) ×4 IMPLANT
MANIFOLD NEPTUNE II (INSTRUMENTS) ×4 IMPLANT
NEEDLE HYPO 22GX1.5 SAFETY (NEEDLE) ×8 IMPLANT
NEEDLE MAYO 6 CRC TAPER PT (NEEDLE) ×4 IMPLANT
NS IRRIG 1000ML POUR BTL (IV SOLUTION) ×4 IMPLANT
NS IRRIG 500ML POUR BTL (IV SOLUTION) ×4 IMPLANT
PACK CYSTO (CUSTOM PROCEDURE TRAY) ×4 IMPLANT
PACK VAGINAL WOMENS (CUSTOM PROCEDURE TRAY) ×4 IMPLANT
PENCIL BUTTON HOLSTER BLD 10FT (ELECTRODE) ×4 IMPLANT
RETRACTOR LONE STAR DISPOSABLE (INSTRUMENTS) IMPLANT
RETRACTOR STAY HOOK 5MM (MISCELLANEOUS) ×4 IMPLANT
SET IRRIG Y TYPE TUR BLADDER L (SET/KITS/TRAYS/PACK) ×4 IMPLANT
SURGIFLO W/THROMBIN 8M KIT (HEMOSTASIS) IMPLANT
SUT ABS MONO DBL WITH NDL 48IN (SUTURE) ×12 IMPLANT
SUT CAPIO PGA 48IN SZ 0 (SUTURE) IMPLANT
SUT CAPIO POLYGLYCOLIC (SUTURE) IMPLANT
SUT CV-0 GORETEX TFX25 36 (SUTURE) IMPLANT
SUT MON AB 2-0 SH 27 (SUTURE) IMPLANT
SUT PDS PLUS 0 (SUTURE)
SUT PDS PLUS AB 0 CT-2 (SUTURE) IMPLANT
SUT VIC AB 0 CT1 27 (SUTURE) ×4
SUT VIC AB 0 CT1 27XBRD ANTBC (SUTURE) ×3 IMPLANT
SUT VIC AB 2-0 SH 27 (SUTURE)
SUT VIC AB 2-0 SH 27XBRD (SUTURE) IMPLANT
SUT VIC AB 3-0 SH 18 (SUTURE) IMPLANT
SUT VICRYL 2-0 SH 8X27 (SUTURE) ×8 IMPLANT
SYR BULB EAR ULCER 3OZ GRN STR (SYRINGE) ×4 IMPLANT
SYS SLING ADV FIT BLUE TRNSVAG (Sling) ×4 IMPLANT
TOWEL OR 17X26 10 PK STRL BLUE (TOWEL DISPOSABLE) ×4 IMPLANT
TRAY FOL W/BAG SLVR 16FR STRL (SET/KITS/TRAYS/PACK) IMPLANT
TRAY FOLEY W/BAG SLVR 14FR (SET/KITS/TRAYS/PACK) ×4 IMPLANT
TRAY FOLEY W/BAG SLVR 14FR LF (SET/KITS/TRAYS/PACK) IMPLANT
TRAY FOLEY W/BAG SLVR 16FR LF (SET/KITS/TRAYS/PACK)

## 2020-12-20 NOTE — Discharge Instructions (Signed)
POST OPERATIVE INSTRUCTIONS  General Instructions . Recovery (not bed rest) will last approximately 6 weeks . Walking is encouraged, but refrain from strenuous exercise/ housework/ heavy lifting. o No lifting >10lbs  . Nothing in the vagina- NO intercourse, tampons or douching . Bathing:  Do not submerge in water (NO swimming, bath, hot tub, etc) until after your postop visit. You can shower starting the day after surgery.  . No driving until you are not taking narcotic pain medicine and until your pain is well enough controlled that you can slam on the breaks or make sudden movements if needed.   Taking your medications 1. Please take your acetaminophen and ibuprofen on a schedule for the first 48 hours. Take 600mg  ibuprofen, then take 500mg  acetaminophen 3 hours later, then continue to alternate ibuprofen and acetaminophen. That way you are taking each type of medication every 6 hours. 2. Take the prescribed narcotic (oxycodone, tramadol, etc) as needed, with a maximum being every 4 hours.  3. Take a stool softener daily to keep your stools soft and preventing you from straining. If you have diarrhea, you decrease your stool softener. This is explained more below. We have prescribed you Miralax. 4. Restart your aspirin and Brilinta the day after surgery  Reasons to Call the Nurse (see last page for phone numbers) . Heavy Bleeding (changing your pad every 1-2 hours) . Persistent nausea/vomiting . Fever (100.4 degrees or more) . Incision problems (pus or other fluid coming out, redness, warmth, increased pain)  Things to Expect After Surgery . Mild to Moderate pain is normal during the first day or two after surgery. If prescribed, take Ibuprofen or Tylenol first and use the stronger medicine for "break-through" pain. You can overlap these medicines because they work differently.   . Constipation   . To Prevent Constipation:  Eat a well-balanced diet including protein, grains, fresh fruit  and vegetables.  Drink plenty of fluids. Walk regularly.  Depending on specific instructions from your physician: take Miralax daily and additionally you can add a stool softener (colace/ docusate) and fiber supplement. Continue as long as you're on pain medications.   . To Treat Constipation:  If you do not have a bowel movement in 2 days after surgery, you can take 2 Tbs of Milk of Magnesia 1-2 times a day until you have a bowel movement. If diarrhea occurs, decrease the amount or stop the laxative. If no results with Milk of Magnesia, you can drink a bottle of magnesium citrate which you can purchase over the counter.  . Fatigue:  This is a normal response to surgery and will improve with time.  Plan frequent rest periods throughout the day.  . Gas Pain:  This is very common but can also be very painful! Drink warm liquids such as herbal teas, bouillon or soup. Walking will help you pass more gas.  Mylicon or Gas-X can be taken over the counter.  . Leaking Urine:  Varying amounts of leakage may occur after surgery.  This should improve with time. Your bladder needs at least 3 months to recover from surgery. If you leak after surgery, be sure to mention this to your doctor at your post-op visit. If you were taking medications for overactive bladder prior to surgery, be sure to restart the medications immediately after surgery.  . Incisions: If you have incisions on your abdomen, the skin glue will dissolve on its own over time. It is ok to gently rinse with soap and water over  these incisions but do not scrub.  Catheter Approximately 50% of patients are unable to urinate after surgery and need to go home with a catheter. This allows your bladder to rest so it can return to full function. If you go home with a catheter, the office will call to set up a voiding trial a few days after surgery. For most patients, by this visit, they are able to urinate on their own. Long term catheter use is rare.    Return to Work  As work demands and recovery times vary widely, it is hard to predict when you will want to return to work. If you have a desk job with no strenuous physical activity, and if you would like to return sooner than generally recommended, discuss this with your provider or call our office.   Post op concerns  For non-emergent issues, please call the Urogynecology Nurse. Please leave a message and someone will contact you within one business day.  You can also send a message through [facility].   AFTER HOURS (After 5:00 PM and on weekends):  For urgent matters that cannot wait until the next business day. Call our office [phone] and connect to the doctor on call.  Please reserve this for important issues.   **FOR ANY TRUE EMERGENCY ISSUES CALL 911 OR GO TO THE NEAREST EMERGENCY ROOM.** Please inform our office or the doctor on call of any emergency.     APPOINTMENTS: Call [phone]    Post Anesthesia Home Care Instructions  Activity: Get plenty of rest for the remainder of the day. A responsible individual must stay with you for 24 hours following the procedure.  For the next 24 hours, DO NOT: -Drive a car -Paediatric nurse -Drink alcoholic beverages -Take any medication unless instructed by your physician -Make any legal decisions or sign important papers.  Meals: Start with liquid foods such as gelatin or soup. Progress to regular foods as tolerated. Avoid greasy, spicy, heavy foods. If nausea and/or vomiting occur, drink only clear liquids until the nausea and/or vomiting subsides. Call your physician if vomiting continues.  Special Instructions/Symptoms: Your throat may feel dry or sore from the anesthesia or the breathing tube placed in your throat during surgery. If this causes discomfort, gargle with warm salt water. The discomfort should disappear within 24 hours.        Indwelling Urinary Catheter Care, Adult An indwelling urinary catheter is a  thin tube that is put into your bladder. The tube helps to drain pee (urine) out of your body. The tube goes in through your urethra. Your urethra is where pee comes out of your body. Your pee will come out through the catheter, then it will go into a bag (drainage bag). Take good care of your catheter so it will work well. How to wear your catheter and bag Supplies needed  Sticky tape (adhesive tape) or a leg strap.  Alcohol wipe or soap and water (if you use tape).  A clean towel (if you use tape).  Large overnight bag.  Smaller bag (leg bag). Wearing your catheter Attach your catheter to your leg with tape or a leg strap.  Make sure the catheter is not pulled tight.  If a leg strap gets wet, take it off and put on a dry strap.  If you use tape to hold the bag on your leg: 1. Use an alcohol wipe or soap and water to wash your skin where the tape made it sticky before. 2.  Use a clean towel to pat-dry that skin. 3. Use new tape to make the bag stay on your leg. Wearing your bags You should have been given a large overnight bag.  You may wear the overnight bag in the day or night.  Always have the overnight bag lower than your bladder.  Do not let the bag touch the floor.  Before you go to sleep, put a clean plastic bag in a wastebasket. Then hang the overnight bag inside the wastebasket. You should also have a smaller leg bag that fits under your clothes.  Always wear the leg bag below your knee.  Do not wear your leg bag at night. How to care for your skin and catheter Supplies needed  A clean washcloth.  Water and mild soap.  A clean towel. Caring for your skin and catheter  Clean the skin around your catheter every day: 1. Wash your hands with soap and water. 2. Wet a clean washcloth in warm water and mild soap. 3. Clean the skin around your urethra.  If you are female:  Gently spread the folds of skin around your vagina (labia).  With the washcloth in your  other hand, wipe the inner side of your labia on each side. Wipe from front to back.  If you are female:  Pull back any skin that covers the end of your penis (foreskin).  With the washcloth in your other hand, wipe your penis in small circles. Start wiping at the tip of your penis, then move away from the catheter.  Move the foreskin back in place, if needed. 4. With your free hand, hold the catheter close to where it goes into your body.  Keep holding the catheter during cleaning so it does not get pulled out. 5. With the washcloth in your other hand, clean the catheter.  Only wipe downward on the catheter.  Do not wipe upward toward your body. Doing this may push germs into your urethra and cause infection. 6. Use a clean towel to pat-dry the catheter and the skin around it. Make sure to wipe off all soap. 7. Wash your hands with soap and water.  Shower every day. Do not take baths.  Do not use cream, ointment, or lotion on the area where the catheter goes into your body, unless your doctor tells you to.  Do not use powders, sprays, or lotions on your genital area.  Check your skin around the catheter every day for signs of infection. Check for: ? Redness, swelling, or pain. ? Fluid or blood. ? Warmth. ? Pus or a bad smell.      How to empty the bag Supplies needed  Rubbing alcohol.  Gauze pad or cotton ball.  Tape or a leg strap. Emptying the bag Pour the pee out of your bag when it is ?- full, or at least 2-3 times a day. Do this for your overnight bag and your leg bag. 1. Wash your hands with soap and water. 2. Separate (detach) the bag from your leg. 3. Hold the bag over the toilet or a clean pail. Keep the bag lower than your hips and bladder. This is so the pee (urine) does not go back into the tube. 4. Open the pour spout. It is at the bottom of the bag. 5. Empty the pee into the toilet or pail. Do not let the pour spout touch any surface. 6. Put rubbing  alcohol on a gauze pad or cotton ball. 7. Use  the gauze pad or cotton ball to clean the pour spout. 8. Close the pour spout. 9. Attach the bag to your leg with tape or a leg strap. 10. Wash your hands with soap and water. Follow instructions for cleaning the drainage bag:  From the product maker.  As told by your doctor. How to change the bag Supplies needed  Alcohol wipes.  A clean bag.  Tape or a leg strap. Changing the bag Replace your bag when it starts to leak, smell bad, or look dirty. 1. Wash your hands with soap and water. 2. Separate the dirty bag from your leg. 3. Pinch the catheter with your fingers so that pee does not spill out. 4. Separate the catheter tube from the bag tube where these tubes connect (at the connection valve). Do not let the tubes touch any surface. 5. Clean the end of the catheter tube with an alcohol wipe. Use a different alcohol wipe to clean the end of the bag tube. 6. Connect the catheter tube to the tube of the clean bag. 7. Attach the clean bag to your leg with tape or a leg strap. Do not make the bag tight on your leg. 8. Wash your hands with soap and water. General rules  Never pull on your catheter. Never try to take it out. Doing that can hurt you.  Always wash your hands before and after you touch your catheter or bag. Use a mild, fragrance-free soap. If you do not have soap and water, use hand sanitizer.  Always make sure there are no twists or bends (kinks) in the catheter tube.  Always make sure there are no leaks in the catheter or bag.  Drink enough fluid to keep your pee pale yellow.  Do not take baths, swim, or use a hot tub.  If you are female, wipe from front to back after you poop (have a bowel movement).   Contact a doctor if:  Your pee is cloudy.  Your pee smells worse than usual.  Your catheter gets clogged.  Your catheter leaks.  Your bladder feels full. Get help right away if:  You have redness,  swelling, or pain where the catheter goes into your body.  You have fluid, blood, pus, or a bad smell coming from the area where the catheter goes into your body.  Your skin feels warm where the catheter goes into your body.  You have a fever.  You have pain in your: ? Belly (abdomen). ? Legs. ? Lower back. ? Bladder.  You see blood in the catheter.  Your pee is pink or red.  You feel sick to your stomach (nauseous).  You throw up (vomit).  You have chills.  Your pee is not draining into the bag.  Your catheter gets pulled out. Summary  An indwelling urinary catheter is a thin tube that is placed into the bladder to help drain pee (urine) out of the body.  The catheter is placed into the part of the body that drains pee from the bladder (urethra).  Taking good care of your catheter will keep it working properly and help prevent problems.  Always wash your hands before and after touching your catheter or bag.  Never pull on your catheter or try to take it out. This information is not intended to replace advice given to you by your health care provider. Make sure you discuss any questions you have with your health care provider. Document Revised: 12/03/2018 Document Reviewed: 03/27/2017  Elsevier Patient Education  2021 [employer].

## 2020-12-20 NOTE — Progress Notes (Signed)
Up to bathroom without voiding.

## 2020-12-20 NOTE — Telephone Encounter (Signed)
[name] underwent Anterior and posterior repair with perineorrhapy, sacrospinous ligament fixation, midurethral sling and cystoscopy on 12/20/20.   She failed her voiding trial.  372ml was backfilled into the bladder. She could not void immediately.  Voided 256ml  PVR by bladder scan was 454ml.   She was discharged with a catheter. Please call her for a routine post op check and to schedule a voiding trial on Monday 5/2 . Thanks!  [name], MD

## 2020-12-20 NOTE — Transfer of Care (Signed)
Immediate Anesthesia Transfer of Care Note  Patient: Abigail [name]  Procedure(s) Performed: SACROSPINOUS LIGAMENT FIXATION (N/A Vagina ) ANTERIOR AND POSTERIOR REPAIR WITH PERINEORRHAPHY (N/A Vagina ) TRANSVAGINAL TAPE (TVT) PROCEDURE (N/A Vagina ) CYSTOSCOPY (N/A Bladder) EXAM UNDER ANESTHESIA (Vagina )  Patient Location: PACU  Anesthesia Type:General  Level of Consciousness: awake, alert , oriented and patient cooperative  Airway & Oxygen Therapy: Patient Spontanous Breathing  Post-op Assessment: Report given to RN and Post -op Vital signs reviewed and stable  Post vital signs: Reviewed and stable  Last Vitals:  Vitals Value Taken Time  BP 135/52 12/20/20 1250  Temp    Pulse 79 12/20/20 1254  Resp 19 12/20/20 1254  SpO2 93 % 12/20/20 1254  Vitals shown include unvalidated device data.  Last Pain:  Vitals:   12/20/20 0938  TempSrc: Oral  PainSc: 5       Patients Stated Pain Goal: 4 (41/96/22 2979)  Complications: No complications documented.

## 2020-12-20 NOTE — Anesthesia Postprocedure Evaluation (Signed)
Anesthesia Post Note  Patient: Abigail [name]  Procedure(s) Performed: SACROSPINOUS LIGAMENT FIXATION (N/A Vagina ) ANTERIOR AND POSTERIOR REPAIR WITH PERINEORRHAPHY (N/A Vagina ) TRANSVAGINAL TAPE (TVT) PROCEDURE (N/A Vagina ) CYSTOSCOPY (N/A Bladder) EXAM UNDER ANESTHESIA (Vagina )     Patient location during evaluation: PACU Anesthesia Type: General Level of consciousness: awake and alert Pain management: pain level controlled Vital Signs Assessment: post-procedure vital signs reviewed and stable Respiratory status: spontaneous breathing, nonlabored ventilation and respiratory function stable Cardiovascular status: blood pressure returned to baseline and stable Postop Assessment: no apparent nausea or vomiting Anesthetic complications: no   No complications documented.  Last Vitals:  Vitals:   12/20/20 1315 12/20/20 1330  BP: (!) 134/51 (!) 129/59  Pulse: 76 74  Resp: 19 16  Temp:    SpO2: 92% 96%               Audry Pili

## 2020-12-20 NOTE — Progress Notes (Signed)
Bladder backfilled with 300 ml NS and urinary cath removed. No urge to void upon standing. Walked to Phase 2 Room 3. With steady gait and 2 person assistance. Tolerated well.

## 2020-12-20 NOTE — Anesthesia Preprocedure Evaluation (Addendum)
Anesthesia Evaluation  Patient identified by MRN, date of birth, ID band Patient awake    Reviewed: Allergy & Precautions, NPO status , Patient's Chart, lab work & pertinent test results  History of Anesthesia Complications (+) PROLONGED EMERGENCE and history of anesthetic complications  Airway Mallampati: III  TM Distance: >3 FB Neck ROM: Full    Dental  (+) Edentulous Upper, Edentulous Lower   Pulmonary former smoker,    Pulmonary exam normal        Cardiovascular hypertension, Pt. on medications + CAD and + Past MI  Normal cardiovascular exam   '21 TTE - EF 60 to 65%. Left atrial size was mildly dilated. No valvulopathy    Neuro/Psych PSYCHIATRIC DISORDERS Anxiety negative neurological ROS     GI/Hepatic negative GI ROS, Neg liver ROS,   Endo/Other  Hypothyroidism  Pre-DM   Renal/GU CRFRenal disease     Musculoskeletal  (+) Arthritis ,  Scoliosis    Abdominal   Peds  Hematology negative hematology ROS (+)   Anesthesia Other Findings Covid test negative   Reproductive/Obstetrics                           Anesthesia Physical Anesthesia Plan  ASA: II  Anesthesia Plan: General   Post-op Pain Management:    Induction: Intravenous  PONV Risk Score and Plan: 3 and Treatment may vary due to age or medical condition, Ondansetron, Propofol infusion and Aprepitant  Airway Management Planned: LMA  Additional Equipment: None  Intra-op Plan:   Post-operative Plan: Extubation in OR  Informed Consent: I have reviewed the patients History and Physical, chart, labs and discussed the procedure including the risks, benefits and alternatives for the proposed anesthesia with the patient or authorized representative who has indicated his/her understanding and acceptance.     Dental advisory given  Plan Discussed with: CRNA and Anesthesiologist  Anesthesia Plan Comments:         Anesthesia Quick Evaluation

## 2020-12-20 NOTE — Interval H&P Note (Signed)
History and Physical Interval Note:  12/20/2020 9:29 AM  Abigail [name]  has presented today for surgery, with the diagnosis of anterior vaginal prolapse; uterovaginal prolapse, incomplete.  The various methods of treatment have been discussed with the patient and family. After consideration of risks, benefits and other options for treatment, the patient has consented to  Procedure(s) with comments: SACROSPINOUS LIGAMENT FIXATION ONLY (N/A)  ANTERIOR REPAIR (CYSTOCELE) (N/A) TRANSVAGINAL TAPE (TVT) PROCEDURE (N/A) CYSTOSCOPY (N/A) as a surgical intervention.  The patient's history has been reviewed, patient examined, no change in status, stable for surgery.  I have reviewed the patient's chart and labs.  Questions were answered to the patient's satisfaction.     [name]

## 2020-12-20 NOTE — Anesthesia Procedure Notes (Signed)
Procedure Name: LMA Insertion Date/Time: 12/20/2020 10:56 AM Performed by: Rogers Blocker, CRNA Pre-anesthesia Checklist: Patient identified, Emergency Drugs available, Suction available and Patient being monitored Patient Re-evaluated:Patient Re-evaluated prior to induction Oxygen Delivery Method: Circle System Utilized Preoxygenation: Pre-oxygenation with 100% oxygen Induction Type: IV induction Ventilation: Mask ventilation without difficulty LMA: LMA inserted LMA Size: 4.0 Number of attempts: 1 Placement Confirmation: positive ETCO2 Tube secured with: Tape Dental Injury: Teeth and Oropharynx as per pre-operative assessment

## 2020-12-20 NOTE — Op Note (Signed)
Operative Note  Preoperative Diagnosis: anterior vaginal prolapse (lateral cystocele), posterior vaginal prolapse, uterovaginal prolapse, incomplete and stress urinary incontinence  Postoperative Diagnosis: same  Procedures performed:  Anterior and posterior repair with perineorrhaphy, sacrospinous ligament hysteropexy, midurethral sling (Advantage Fit), cystoscopy  Implants:  Implant Name Type Inv. Item Serial No. Manufacturer Lot No. LRB No. Used Action  SYS SLING ADV FIT BLUE TRNSVAG - [id number] Sling SYS SLING ADV FIT BLUE TRNSVAG  Wilkie Aye [id number]  1 Implanted    Attending Surgeon: Sherlene Shams, MD   Anesthesia: General endotracheal  Findings: 1. On vaginal exam, stage III pelvic organ prolapse   2. On cystoscopy, normal bladder and urethra without injury, lesion or foreign body. Brisk bilateral ureteral efflux noted.    Specimens: none  Estimated blood loss: 100 mL  IV fluids: 1250 mL  Urine output: 474 mL  Complications: none  Indications: 85yo F who failed multiple pessaries in the office. Had a pessary that kept prolapse in place but cause vaginal bleeding and anemia which required transfusion. Patient decided to proceed with surgical correction. Prior to the surgery, we planned for vaginal reconstruction with the possibility of colpocleisis if prolapse was more pronounced on exam under anesthesia. However, under anesthesia, the posterior vaginal was well supported and cervix only came to 0 with anterior prolapse to +3. Therefore, I proceeded with vaginal reconstruction.   Procedure in Detail:  After informed consent was obtained, the patient was taken to the operating room where anesthesia was induced and found to be adequate. She was placed in dorsal lithotomy position, taking care to avoid any traction on the extremities, and then prepped and draped in the usual sterile fashion. A self-retaining lonestar retractor was placed using four elastic blue stays.   After a foley catheter was inserted into the urethra, the location of the midurethra was palpated. Two Allis clamps were along the anterior vaginal wall defect. 1% lidocaine with epinephrine was injected into the vaginal mucosa.  A vertical incision was made between these two Allis clamps with a 15 blade scalpel.  Allis clamps were placed along this incision and Metzenbaum scissors were used to undermine the vaginal mucosa along the incision.  The vaginal mucosa was then sharply dissected off to the vesicovaginal septum bilaterally to the level of the pubic rami.    For the sacrospinous ligament fixation (SSLF), the ischial spine was accessed on the right side via dissection with Mayo scissors and blunt dissection.  The sacrospinous ligament was palpated. Two 0 PDS suture was then placed at the sacrospinous ligament two fingerbreadths medial to the ischial spine, in order to avoid the pudendal neurovascular bundle, using a Capio needle driver.  The PDS suture was then placed through the anterior cervix and held. Anterior plication of the vesicovaginal septum was then performed using plicating sutures of 2-0 Vicryl in three layers. The vaginal mucosal edges were trimmed and the incision reapproximated with 2-0 Vicryl in a running fashion. The SSLF suture was then tied down with excellent support of the anterior and apical vagina.  The retropubic midurethral sling was performed next. Two Allis clamps were placed lateral to the location of the midurethra. 1% lidocaine with epinephrine was injected into the vaginal mucosa and laterally.  Using a 15 blade scapel a small vertical incision was made in the vaginal mucosa. Metzenbaum scissors were used to dissect the vaginal mucosa off of the underlying muscularis and to create the periurethral tunnels. The trocar and attached sling were introduced into the right  side of the vaginal incision, just inferior to the pubic symphysis on the right side. The trocar was guided  through the endopelvic fascia and directly behind the pubic symphysis through the retropubic space, vertically.  The trocar was guided out through the suprapubic region, 2 fingerbreadths lateral to midline at the level of the pubic symphysis on the ipsilateral side. The trocar was similarly placed on the left side.  The Foley catheter was removed.  A 70-degree cystoscope was introduced, and 360-degree inspection revealed no trauma or trocars in the bladder, with bilateral ureteral efflux.  The bladder was drained and the cystoscope was removed.  The Foley catheter was reinserted. The sling was brought to lie beneath the mid-urethra.  An empty needle driver was placed behind the sling to ensure a tension free placement. The plastic sheath was removed from the sling and the distal ends of the sling were trimmed just below the level of the skin incisions. The skin incisions were closed with dermabond.  Hemostasis was noted.  Tension-free positioning of the sling was confirmed.  The vaginal incision was closed with 2-0 vicryl.  Attention was then turned to the posterior vagina.  Two Allis clamps were in the midline of the posterior vaginal wall defect.  1% lidocaine with epinephrine was injected into the vaginal mucosa. A vertical incision was made between these clamps with a 15 blade scalpel and a diamond incision was made over the perineum and perineal skin was removed.  The rectovaginal septum was then dissected off the vaginal mucosa bilaterally.  No enterocele was noted.  The rectovaginal septum was then plicated with plicating sutures of 2-0 Vicryl. After plication, the excess vaginal mucosa was trimmed and the vaginal mucosa was reapproximated using 2-0 Vicryl sutures in a running fashion. The bulbocavernosus muscles and perineal body was reapproximated with an 0-vicryl suture. The perineal skin was then closed in a subcutaneous and subcuticular fashion with 2-0 vicryl. The vagina was copiously irrigated.   Hemostasis was noted.  Vaginal packing was not placed.  A rectal examination was normal and confirmed no sutures within the rectum.  The patient tolerated the procedure well.  She was awakened from anesthesia and transferred to the recovery room in stable condition. All counts were correct x 2.    [name], MD

## 2020-12-20 NOTE — Progress Notes (Signed)
Spoke with Dr. Wannetta Sender regarding patient voiding trial. Patient urinated 225 ml on her own, post void residual bladder scan was 450 ml. New order received for 14 french foley catheter insertion and to advise patient to except a phone call from the office to schedule catheter removal next week.

## 2020-12-21 ENCOUNTER — Encounter (HOSPITAL_BASED_OUTPATIENT_CLINIC_OR_DEPARTMENT_OTHER): Payer: Self-pay | Admitting: Obstetrics and Gynecology

## 2020-12-21 NOTE — Telephone Encounter (Signed)
Post- Op Call  Abigail [name] underwent anterior and posterior repair with perineorrhaphy, sacrospinous ligament fixation, midurethral sling and cystoscopy on 12/20/20 with Dr Wannetta Sender. The patient reports that her pain is controlled. She is taking tylenol, ibuprofen, and oxycodone at times. She reports vaginal bleeding that is minimal.  She has not had a bowel movement and is taking colace for a bowel regimen. She will take miralax later today. She was discharged with a catheter and will return on Monday 12/24/20 for a voiding trial. Advised to call with any concerns.Pt verbalized understanding.   Blenda Nicely, RN

## 2020-12-23 ENCOUNTER — Other Ambulatory Visit: Payer: Self-pay | Admitting: Nurse Practitioner

## 2020-12-23 DIAGNOSIS — N3281 Overactive bladder: Secondary | ICD-10-CM

## 2020-12-24 ENCOUNTER — Ambulatory Visit (INDEPENDENT_AMBULATORY_CARE_PROVIDER_SITE_OTHER): Payer: PPO

## 2020-12-24 ENCOUNTER — Other Ambulatory Visit: Payer: Self-pay

## 2020-12-24 ENCOUNTER — Encounter (HOSPITAL_BASED_OUTPATIENT_CLINIC_OR_DEPARTMENT_OTHER): Payer: Self-pay | Admitting: Obstetrics and Gynecology

## 2020-12-24 DIAGNOSIS — R350 Frequency of micturition: Secondary | ICD-10-CM

## 2020-12-24 NOTE — Progress Notes (Signed)
Urogyn Nurse Voiding Trial Note  [name] underwent  anterior and posterior repair with perineorrhaphy, sacrospinous ligament fixation, midurethral sling and cystoscopy   on 12/20/2020.  She presents today for a voiding trial .   Patient was identified with 2 indicators. 366ml of NS was instilled into the bladder via the catheter. The catheter was removed and patient was instructed to void into the urinary hat. She voided 280 ml. The post void residual measured by bladder scan was 16ml. She passed the voiding trial and a catheter was not replaced.   The patient received aftercare instructions and will follow up as scheduled.

## 2021-01-01 ENCOUNTER — Encounter: Payer: Self-pay | Admitting: Nurse Practitioner

## 2021-01-01 ENCOUNTER — Other Ambulatory Visit: Payer: Self-pay

## 2021-01-01 ENCOUNTER — Ambulatory Visit: Payer: PPO | Admitting: Obstetrics & Gynecology

## 2021-01-01 ENCOUNTER — Ambulatory Visit (INDEPENDENT_AMBULATORY_CARE_PROVIDER_SITE_OTHER): Payer: PPO | Admitting: Nurse Practitioner

## 2021-01-01 VITALS — BP 140/72 | HR 73 | Temp 98.1°F | Ht 64.0 in | Wt 155.0 lb

## 2021-01-01 DIAGNOSIS — I10 Essential (primary) hypertension: Secondary | ICD-10-CM

## 2021-01-01 DIAGNOSIS — E782 Mixed hyperlipidemia: Secondary | ICD-10-CM | POA: Diagnosis not present

## 2021-01-01 DIAGNOSIS — R7303 Prediabetes: Secondary | ICD-10-CM | POA: Diagnosis not present

## 2021-01-01 DIAGNOSIS — E039 Hypothyroidism, unspecified: Secondary | ICD-10-CM

## 2021-01-01 NOTE — Progress Notes (Signed)
I,Yamilka Roman [employer] as a Education administrator for [employer], FNP.,have documented all relevant documentation on the behalf of [name], FNP,as directed by  [name], FNP while in the presence of [name], [place]. This visit occurred during the SARS-CoV-2 public health emergency.  Safety protocols were in place, including screening questions prior to the visit, additional usage of staff PPE, and extensive cleaning of exam room while observing appropriate contact time as indicated for disinfecting solutions.  Subjective:     Patient ID: Abigail [name] , female    DOB: 01/15/1936 , 85 y.o.   MRN: [id number]   Chief Complaint  Patient presents with  . Hypothyroidism    HPI  Pt is here today for a thyroid follow up, she was taken off of oxybutinin recently, in place of trospium chloride ordered by Dr Festus Holts. She did have surgery on the 28th of April. She continues to have sutures vaginally and will have them removed in 4 weeks. She is to see Dr. Sabra Heck next month.  She had her pessary removed after having 5-8 insertions.   Wt Readings from Last 3 Encounters: 01/01/21 : 218 lb (98.9 kg) 12/20/20 : 158 lb 6.4 oz (71.8 kg) 12/11/20 : 157 lb (71.2 kg)   Thyroid Problem Presents for follow-up visit. Patient reports no cold intolerance, constipation, fatigue, tremors or weight loss. The symptoms have been stable.     Past Medical History:  Diagnosis Date  . Anemia   . Arthritis    knees, back  . Carotid artery occlusion   . Chronic kidney disease    stage 3 ckd no nephrologist  . Complete uterine prolapse with prolapse of anterior vaginal wall   . Complication of anesthesia    hard to wake up per pt  . Constipation   . Coronary artery disease   . Diverticulitis yrs ago coialitis  . History of blood transfusion   . Hypertension   . Hypothyroid   . Numbness    in hands at times  . Pre-diabetes   . Scoliosis   . STEMI (ST elevation myocardial infarction) ([place]) 10/26/2019    DES RCA  . Wears dentures    full dentures  . Wears glasses    for reading     Family History  Problem Relation Age of Onset  . Hypertension Mother      Current Outpatient Medications:  .  acetaminophen (TYLENOL) 325 MG tablet, Take 2 tablets (650 mg total) by mouth every 6 (six) hours as needed for mild pain (or Fever >/= 101)., Disp: , Rfl:  .  aspirin EC 81 MG tablet, Take 81 mg by mouth daily., Disp: , Rfl:  .  atorvastatin (LIPITOR) 80 MG tablet, Take 1 tablet (80 mg total) by mouth daily at 6 PM., Disp: 90 tablet, Rfl: 3 .  BRILINTA 90 MG TABS tablet, TAKE 1 TABLET BY MOUTH TWICE A DAY, Disp: 60 tablet, Rfl: 11 .  Cranberry 1000 MG CAPS, Take 1,000 mg by mouth daily at 12 noon., Disp: , Rfl:  .  cyclobenzaprine (FLEXERIL) 10 MG tablet, Take 1 tablet (10 mg total) by mouth 3 (three) times daily as needed for muscle spasms., Disp: 30 tablet, Rfl: 1 .  docusate sodium (COLACE) 100 MG capsule, Take 100-200 mg by mouth See admin instructions. Take 100 mg in the morning and 200 mg at bedtime, Disp: , Rfl:  .  estradiol (ESTRACE) 0.1 MG/GM vaginal cream, Place 0.5 g vaginally 2 (two) times a week. Place  0.5g twice a week at opening of vagina (Patient taking differently: Place 0.5 g vaginally daily. Place 0.5g twice a week at opening of vagina), Disp: 30 g, Rfl: 11 .  ferrous sulfate 300 (60 Fe) MG/5ML syrup, Take 5 mLs (300 mg total) by mouth 2 (two) times daily with a meal., Disp: 150 mL, Rfl: 3 .  levothyroxine (SYNTHROID) 175 MCG tablet, TAKE 1 TABLET BY MOUTH EVERY MORNING ON MONDAY - FRIDAY, Disp: 90 tablet, Rfl: 1 .  lisinopril-hydrochlorothiazide (ZESTORETIC) 20-25 MG tablet, Take 0.5 tablets by mouth 3 (three) times a week. Take on Monday Wednesday and Friday only, Disp: 90 tablet, Rfl: 1 .  nitroGLYCERIN (NITROSTAT) 0.4 MG SL tablet, Place 1 tablet (0.4 mg total) under the tongue every 5 (five) minutes x 3 doses as needed for chest pain., Disp: 25 tablet, Rfl: 3 .  oxyCODONE (OXY  IR/ROXICODONE) 5 MG immediate release tablet, Take 1 tablet (5 mg total) by mouth every 4 (four) hours as needed for severe pain. (Patient taking differently: Take 5 mg by mouth every 4 (four) hours as needed for severe pain. For after surgery), Disp: 20 tablet, Rfl: 0 .  OXYQUINOLONE SULFATE VAGINAL (TRIMO-SAN) 0.025 % GEL, Place 1 application vaginally as needed., Disp: 30 g, Rfl: 5 .  polyethylene glycol powder (GLYCOLAX/MIRALAX) 17 GM/SCOOP powder, Take 255 g by mouth daily. Drink 17g (1 scoop) dissolved in water per day. (Patient taking differently: Take 1 Container by mouth daily. Drink 17g (1 scoop) dissolved in water per day.for after surgery), Disp: 255 g, Rfl: 0 .  Trospium Chloride 60 MG CP24, Take 1 capsule (60 mg total) by mouth daily., Disp: 30 capsule, Rfl: 5 .  oxybutynin (DITROPAN-XL) 10 MG 24 hr tablet, TAKE 1 TABLET BY MOUTH EVERYDAY AT BEDTIME (Patient not taking: Reported on 01/01/2021), Disp: 90 tablet, Rfl: 1   Allergies  Allergen Reactions  . Codeine Nausea And Vomiting  . Norvasc [Amlodipine] Rash and Other (See Comments)    rash     Review of Systems  Constitutional: Negative for fatigue and weight loss.  Respiratory: Negative.   Cardiovascular: Negative.   Gastrointestinal: Negative for constipation.  Endocrine: Negative for cold intolerance.  Musculoskeletal: Negative.   Neurological: Negative for tremors.  Psychiatric/Behavioral: Negative.      Today's Vitals   01/01/21 0915  BP: 140/72  Pulse: 73  Temp: 98.1 F (36.7 C)  SpO2: 99%  Weight: 218 lb (98.9 kg)  Height: 5\' 4"  (1.626 m)  PainSc: 0-No pain   Body mass index is 37.42 kg/m.  Wt Readings from Last 3 Encounters:  01/01/21 218 lb (98.9 kg)  12/20/20 158 lb 6.4 oz (71.8 kg)  12/11/20 157 lb (71.2 kg)   Objective:  Physical Exam Vitals reviewed.  Constitutional:      General: She is not in acute distress.    Appearance: Normal appearance.  Cardiovascular:     Rate and Rhythm: Normal  rate and regular rhythm.     Pulses: Normal pulses.     Heart sounds: Normal heart sounds. No murmur heard.   Pulmonary:     Effort: Pulmonary effort is normal. No respiratory distress.     Breath sounds: Normal breath sounds. No wheezing.  Neurological:     General: No focal deficit present.     Mental Status: She is alert and oriented to person, place, and time.     Cranial Nerves: No cranial nerve deficit.  Psychiatric:        Mood and  Affect: Mood normal.        Behavior: Behavior normal.        Thought Content: Thought content normal.        Judgment: Judgment normal.         Assessment And Plan:     1. Acquired hypothyroidism  Chronic, controlled  Continue with current medications, tolerating well and continues to take her levothyroxine M-F - T3, free - T4 - TSH  2. Essential hypertension . B/P is controlled.  . CMP ordered to check renal function.  . The importance of regular exercise and dietary modification was stressed to the patient.   3. Mixed hyperlipidemia  Chronic, controlled  Continue with current medications  4. Prediabetes  Chronic, controlled  Continue with current medications  Encouraged to limit intake of sugary foods and drinks  Encouraged to increase physical activity to 150 minutes per week as tolerated.   She will have her AWV by telephone on 5/25.   Patient was given opportunity to ask questions. Patient verbalized understanding of the plan and was able to repeat key elements of the plan. All questions were answered to their satisfaction.  [name], FNP   I, [name], FNP, have reviewed all documentation for this visit. The documentation on 01/01/21 for the exam, diagnosis, procedures, and orders are all accurate and complete.   IF YOU HAVE BEEN REFERRED TO A SPECIALIST, IT MAY TAKE 1-2 WEEKS TO SCHEDULE/PROCESS THE REFERRAL. IF YOU HAVE NOT HEARD FROM US/SPECIALIST IN TWO WEEKS, PLEASE GIVE [place] A CALL AT [phone] X [phone].    THE PATIENT IS ENCOURAGED TO PRACTICE SOCIAL DISTANCING DUE TO THE COVID-19 PANDEMIC.

## 2021-01-02 ENCOUNTER — Telehealth: Payer: Self-pay | Admitting: Nurse Practitioner

## 2021-01-02 LAB — T3, FREE: T3, Free: 3.1 pg/mL (ref 2.0–4.4)

## 2021-01-02 LAB — T4: T4, Total: 8.9 ug/dL (ref 4.5–12.0)

## 2021-01-02 LAB — TSH: TSH: 0.486 u[IU]/mL (ref 0.450–4.500)

## 2021-01-02 NOTE — Telephone Encounter (Signed)
Left message asking patient to call   I wanted to see if patient could r/s her appointment   Abigail [name]'s schedule to full.  Only needs 10 patient 11 max scheduled for day

## 2021-01-03 ENCOUNTER — Telehealth: Payer: PPO

## 2021-01-03 ENCOUNTER — Telehealth: Payer: Self-pay

## 2021-01-03 NOTE — Telephone Encounter (Signed)
-----   Message from [name], [place] sent at 01/02/2021  1:25 PM EDT ----- Your thyroid levels are normal.

## 2021-01-03 NOTE — Telephone Encounter (Signed)
Left a detailed message.

## 2021-01-08 ENCOUNTER — Encounter: Payer: Self-pay | Admitting: Obstetrics and Gynecology

## 2021-01-16 ENCOUNTER — Ambulatory Visit: Payer: PPO

## 2021-01-16 ENCOUNTER — Ambulatory Visit: Payer: PPO | Admitting: Nurse Practitioner

## 2021-01-31 ENCOUNTER — Ambulatory Visit (INDEPENDENT_AMBULATORY_CARE_PROVIDER_SITE_OTHER): Payer: PPO | Admitting: Obstetrics & Gynecology

## 2021-01-31 ENCOUNTER — Other Ambulatory Visit: Payer: Self-pay

## 2021-01-31 ENCOUNTER — Encounter (HOSPITAL_BASED_OUTPATIENT_CLINIC_OR_DEPARTMENT_OTHER): Payer: Self-pay | Admitting: Obstetrics & Gynecology

## 2021-01-31 VITALS — BP 171/73 | HR 78 | Ht 65.0 in | Wt 160.0 lb

## 2021-01-31 DIAGNOSIS — Z9889 Other specified postprocedural states: Secondary | ICD-10-CM

## 2021-01-31 DIAGNOSIS — Z87448 Personal history of other diseases of urinary system: Secondary | ICD-10-CM

## 2021-01-31 DIAGNOSIS — N812 Incomplete uterovaginal prolapse: Secondary | ICD-10-CM

## 2021-01-31 DIAGNOSIS — N3281 Overactive bladder: Secondary | ICD-10-CM

## 2021-01-31 NOTE — Progress Notes (Signed)
GYNECOLOGY  VISIT  CC:   post op recheck  HPI: 85 y.o. [id number] Widowed White or Caucasian female here for recheck after undergoing anterior/posterior repair, sacrospinous ligament suspension, TVT and cystoscopy on 12/20/2020.  She reports bleeding is minimal.  She has no pain.  Bowel function is  normal but still taking colace twice daily .  Bladder function much improved.  Thinks she maybe is having a little urinary leakage.    Questions answered.    MEDS:   Current Outpatient Medications on File Prior to Visit  Medication Sig Dispense Refill   acetaminophen (TYLENOL) 325 MG tablet Take 2 tablets (650 mg total) by mouth every 6 (six) hours as needed for mild pain (or Fever >/= 101).     aspirin EC 81 MG tablet Take 81 mg by mouth daily.     atorvastatin (LIPITOR) 80 MG tablet Take 1 tablet (80 mg total) by mouth daily at 6 PM. 90 tablet 3   BRILINTA 90 MG TABS tablet TAKE 1 TABLET BY MOUTH TWICE A DAY 60 tablet 11   Cranberry 1000 MG CAPS Take 1,000 mg by mouth daily at 12 noon.     cyclobenzaprine (FLEXERIL) 10 MG tablet Take 1 tablet (10 mg total) by mouth 3 (three) times daily as needed for muscle spasms. 30 tablet 1   docusate sodium (COLACE) 100 MG capsule Take 100-200 mg by mouth See admin instructions. Take 100 mg in the morning and 200 mg at bedtime     estradiol (ESTRACE) 0.1 MG/GM vaginal cream Place 0.5 g vaginally 2 (two) times a week. Place 0.5g twice a week at opening of vagina (Patient taking differently: Place 0.5 g vaginally daily. Place 0.5g twice a week at opening of vagina) 30 g 11   levothyroxine (SYNTHROID) 175 MCG tablet TAKE 1 TABLET BY MOUTH EVERY MORNING ON MONDAY - FRIDAY 90 tablet 1   lisinopril-hydrochlorothiazide (ZESTORETIC) 20-25 MG tablet Take 0.5 tablets by mouth 3 (three) times a week. Take on Monday Wednesday and Friday only 90 tablet 1   nitroGLYCERIN (NITROSTAT) 0.4 MG SL tablet Place 1 tablet (0.4 mg total) under the tongue every 5 (five) minutes x 3 doses  as needed for chest pain. 25 tablet 3   oxyCODONE (OXY IR/ROXICODONE) 5 MG immediate release tablet Take 1 tablet (5 mg total) by mouth every 4 (four) hours as needed for severe pain. (Patient taking differently: Take 5 mg by mouth every 4 (four) hours as needed for severe pain. For after surgery) 20 tablet 0   OXYQUINOLONE SULFATE VAGINAL (TRIMO-SAN) 0.025 % GEL Place 1 application vaginally as needed. 30 g 5   Trospium Chloride 60 MG CP24 Take 1 capsule (60 mg total) by mouth daily. 30 capsule 5   ferrous sulfate 300 (60 Fe) MG/5ML syrup Take 5 mLs (300 mg total) by mouth 2 (two) times daily with a meal. 150 mL 3   oxybutynin (DITROPAN-XL) 10 MG 24 hr tablet TAKE 1 TABLET BY MOUTH EVERYDAY AT BEDTIME (Patient not taking: Reported on 01/31/2021) 90 tablet 1   polyethylene glycol powder (GLYCOLAX/MIRALAX) 17 GM/SCOOP powder Take 255 g by mouth daily. Drink 17g (1 scoop) dissolved in water per day. (Patient not taking: Reported on 01/31/2021) 255 g 0   No current facility-administered medications on file prior to visit.    SH:  Smoking No    PHYSICAL EXAMINATION:    BP (!) 171/73 (BP Location: Right Arm, Patient Position: Sitting, Cuff Size: Small)   Pulse 78  Ht 5\' 5"  (1.651 m) Comment: reported  Wt 160 lb (72.6 kg) Comment: reported  BMI 26.63 kg/m     General appearance: alert, cooperative and appears stated age Abdomen: soft, non-tender; bowel sounds normal; no masses,  no organomegaly Incisions:  C/D/I  Pelvic: External genitalia:  no lesions              Urethra:  normal appearing urethra with no masses, tenderness or lesions              Bartholins and Skenes: normal                 Vagina: normal appearing vagina with normal color and discharge, no lesions              Cervix: no lesions              Bimanual Exam:  Uterus:  normal size, contour, position, consistency, mobility, non-tender              Adnexa: no mass, fullness, tenderness  Chaperone, Octaviano Batty, was present  for exam.  Assessment/Plan: 1. Post-operative state - return for follow up with Dr. Wannetta Sender in 6 weeks. - note given to return to work with lifting limitation for 6 additional weeks ending 04/14/2021  2. OAB (overactive bladder)  3. Incomplete uterine prolapse  4. History of cystocele

## 2021-02-11 NOTE — Progress Notes (Signed)
Cardiology Office Note   Date:  02/12/2021   ID:  Abigail [name], DOB [birth year], MRN [id number]  PCP:  [name], FNP    No chief complaint on file.    Wt Readings from Last 3 Encounters:  02/12/21 164 lb 9.6 oz (74.7 kg)  01/31/21 160 lb (72.6 kg)  01/01/21 155 lb (70.3 kg)       History of Present Illness: Abigail [name] is a 85 y.o. female  who has had presyncope, hyponatremia in the past.  I saw her in 10/2018 for these issues.    In March 2021, she presented with an ST elevation MI.   Cath report showed: "A drug-eluting stent was successfully placed using a SYNERGY XD 2.75X24. Post intervention, there is a 0% residual stenosis.   1.  Severe single-vessel coronary artery disease with a proximal RCA culprit lesion, treated successfully with primary PCI using a 2.75 x 24 mm Synergy DES 2.  Mild diffuse nonobstructive disease involving the LAD and left circumflex without any significant stenoses 3.  Normal LVEDP 4.  Moderately severe diffuse residual stenosis in the mid and distal RCA, favor initial medical therapy."   Echo 2021: "Left ventricular ejection fraction, by estimation, is 60 to 65%. The  left ventricle has normal function. The left ventricle has no regional  wall motion abnormalities. Left ventricular diastolic parameters were  normal.   2. Right ventricular systolic function is normal. The right ventricular  size is normal.   3. Left atrial size was mildly dilated.   4. The mitral valve is normal in structure and function. No evidence of  mitral valve regurgitation. No evidence of mitral stenosis.   5. The aortic valve is normal in structure and function. Aortic valve  regurgitation is not visualized. No aortic stenosis is present. "   Walking was limited due to GYN issues.    She had angina in 10/2020 and cath was planned but she was found to be severely anemic and required urgent GYN surgery.  Has done well  Cleared to return to work with weight  restriction of 10 lbs.  Bleeding has stopped.   She has had not had any prolonged chest pain.  No sx like her MI since she had her GYN surgery.       Past Medical History:  Diagnosis Date   Anemia    Arthritis    knees, back   Carotid artery occlusion    Chronic kidney disease    stage 3 ckd no nephrologist   Complete uterine prolapse with prolapse of anterior vaginal wall    Complication of anesthesia    hard to wake up per pt   Constipation    Coronary artery disease    Diverticulitis yrs ago coialitis   History of blood transfusion    Hypertension    Hypothyroid    Numbness    in hands at times   Pre-diabetes    Scoliosis    STEMI (ST elevation myocardial infarction) ([place]) 10/26/2019   DES RCA   Wears dentures    full dentures   Wears glasses    for reading    Past Surgical History:  Procedure Laterality Date   ANTERIOR AND POSTERIOR REPAIR WITH SACROSPINOUS FIXATION N/A 12/20/2020   Procedure: SACROSPINOUS LIGAMENT FIXATION;  Surgeon: [name], MD;  Location: [facility];  Service: Gynecology;  Laterality: N/A;  Total time requested for all procedures is 2 hours   BACK SURGERY  66 age 71   lower   bartholin cyst removal  age 72's   BLADDER SUSPENSION N/A 12/20/2020   Procedure: TRANSVAGINAL TAPE (TVT) PROCEDURE;  Surgeon: [name], MD;  Location: [facility];  Service: Gynecology;  Laterality: N/A;   CORONARY/GRAFT ACUTE MI REVASCULARIZATION N/A 10/26/2019   Procedure: Coronary/Graft Acute MI Revascularization;  Surgeon: Sherren Mocha, MD;  Location: [place] [facility];  Service: Cardiovascular;  Laterality: N/A;   CYSTOCELE REPAIR N/A 12/20/2020   Procedure: ANTERIOR AND POSTERIOR REPAIR WITH PERINEORRHAPHY;  Surgeon: [name], MD;  Location: [facility];  Service: Gynecology;  Laterality: N/A;   CYSTOSCOPY N/A 12/20/2020   Procedure: CYSTOSCOPY;  Surgeon: [name],  MD;  Location: [facility];  Service: Gynecology;  Laterality: N/A;   ELBOW SURGERY  1990's   left   LEFT HEART CATH AND CORONARY ANGIOGRAPHY N/A 10/26/2019   Procedure: LEFT HEART CATH AND CORONARY ANGIOGRAPHY;  Surgeon: Sherren Mocha, MD;  Location: [facility] [facility];  Service: Cardiovascular;  Laterality: N/A;     Current Outpatient Medications  Medication Sig Dispense Refill   acetaminophen (TYLENOL) 325 MG tablet Take 2 tablets (650 mg total) by mouth every 6 (six) hours as needed for mild pain (or Fever >/= 101).     aspirin EC 81 MG tablet Take 81 mg by mouth daily.     atorvastatin (LIPITOR) 80 MG tablet Take 1 tablet (80 mg total) by mouth daily at 6 PM. 90 tablet 3   BRILINTA 90 MG TABS tablet TAKE 1 TABLET BY MOUTH TWICE A DAY 60 tablet 11   Cranberry 1000 MG CAPS Take 1,000 mg by mouth daily at 12 noon.     cyclobenzaprine (FLEXERIL) 10 MG tablet Take 1 tablet (10 mg total) by mouth 3 (three) times daily as needed for muscle spasms. 30 tablet 1   docusate sodium (COLACE) 100 MG capsule Take 100-200 mg by mouth See admin instructions. Take 100 mg in the morning and 200 mg at bedtime     estradiol (ESTRACE) 0.1 MG/GM vaginal cream Place 0.5 g vaginally 2 (two) times a week. Place 0.5g twice a week at opening of vagina (Patient taking differently: Place 0.5 g vaginally daily. Place 0.5g twice a week at opening of vagina) 30 g 11   ferrous sulfate 300 (60 Fe) MG/5ML syrup Take 5 mLs (300 mg total) by mouth 2 (two) times daily with a meal. 150 mL 3   levothyroxine (SYNTHROID) 175 MCG tablet TAKE 1 TABLET BY MOUTH EVERY MORNING ON MONDAY - FRIDAY 90 tablet 1   lisinopril-hydrochlorothiazide (ZESTORETIC) 20-25 MG tablet Take 0.5 tablets by mouth 3 (three) times a week. Take on Monday Wednesday and Friday only 90 tablet 1   nitroGLYCERIN (NITROSTAT) 0.4 MG SL tablet Place 1 tablet (0.4 mg total) under the tongue every 5 (five) minutes x 3 doses as needed for chest pain. 25  tablet 3   Trospium Chloride 60 MG CP24 Take 1 capsule (60 mg total) by mouth daily. 30 capsule 5   No current facility-administered medications for this visit.    Allergies:   Codeine and Norvasc [amlodipine]    Social History:  The patient  reports that she quit smoking about 34 years ago. Her smoking use included cigarettes. She has a 52.50 pack-year smoking history. She has never used smokeless tobacco. She reports that she does not drink alcohol and does not use drugs.   Family History:  The  patient's family history includes Hypertension in her mother.    ROS:  Please see the history of present illness.   Otherwise, review of systems are positive for weight restriction.   All other systems are reviewed and negative.    PHYSICAL EXAM: VS:  BP (!) 150/68   Pulse 86   Ht 5\' 5"  (1.651 m)   Wt 164 lb 9.6 oz (74.7 kg)   SpO2 94%   BMI 27.39 kg/m  , BMI Body mass index is 27.39 kg/m. GEN: Well nourished, well developed, in no acute distress,frail HEENT: normal Neck: no JVD, carotid bruits, or masses Cardiac: RRR; no murmurs, rubs, or gallops,no edema  Respiratory:  clear to auscultation bilaterally, normal work of breathing GI: soft, nontender, nondistended, + BS MS: no deformity or atrophy Skin: warm and dry, no rash Neuro:  Strength and sensation are intact Psych: euthymic mood, full affect    Recent Labs: 05/16/2020: ALT 19 12/04/2020: Platelets 349 12/20/2020: BUN 25; Creatinine, Ser 1.30; Hemoglobin 11.9; Potassium 4.8; Sodium 138 01/01/2021: TSH 0.486   Lipid Panel    Component Value Date/Time   CHOL 142 05/16/2020 0913   TRIG 122 05/16/2020 0913   HDL 51 05/16/2020 0913   CHOLHDL 2.8 05/16/2020 0913   CHOLHDL 4.8 10/26/2019 2246   VLDL 40 10/26/2019 2246   LDLCALC 69 05/16/2020 0913     Other studies Reviewed: Additional studies/ records that were reviewed today with results demonstrating: labs reviewed   ASSESSMENT AND PLAN:  CAD/Old MI: Angina has  resolved since her Hbg.  Continue aggressive secondary prevention.  If the hemoglobin is still low, will have to continue the iron.  Check CBC.  Stop Brilinta and start clopidogrel 75 mg daily.  Stop aspirin after she has made the transition to clopidogrel and there are no issues.  Given her recent bleeding history, I think we should try to minimize her antiplatelet therapy as much as possible..   Carotid disease: Mild plaque on 04/13/2018 CTA. HTN: The current medical regimen is effective;  continue present plan and medications. Hyperlipidemia: LDL 69 in 2021.  Continue atorvastatin.  Whole food, plant-based diet.    Current medicines are reviewed at length with the patient today.  The patient concerns regarding her medicines were addressed.  The following changes have been made:  stop Brilinta. Start clopidogrel 75 mg daily.  Labs/ tests ordered today include: CBC No orders of the defined types were placed in this encounter.   Recommend 150 minutes/week of aerobic exercise Low fat, low carb, high fiber diet recommended  Disposition:   FU in 6 months   Signed, Larae Grooms, MD  02/12/2021 1:29 PM    [facility] [address], [place], [place]  [address] Phone: [phone]; Fax: [phone]

## 2021-02-12 ENCOUNTER — Other Ambulatory Visit: Payer: Self-pay

## 2021-02-12 ENCOUNTER — Encounter: Payer: Self-pay | Admitting: Interventional Cardiology

## 2021-02-12 ENCOUNTER — Ambulatory Visit: Payer: PPO | Admitting: Interventional Cardiology

## 2021-02-12 VITALS — BP 150/68 | HR 86 | Ht 65.0 in | Wt 164.6 lb

## 2021-02-12 DIAGNOSIS — I10 Essential (primary) hypertension: Secondary | ICD-10-CM

## 2021-02-12 DIAGNOSIS — E782 Mixed hyperlipidemia: Secondary | ICD-10-CM | POA: Diagnosis not present

## 2021-02-12 DIAGNOSIS — I2510 Atherosclerotic heart disease of native coronary artery without angina pectoris: Secondary | ICD-10-CM

## 2021-02-12 LAB — CBC
Hematocrit: 33 % — ABNORMAL LOW (ref 34.0–46.6)
Hemoglobin: 10.6 g/dL — ABNORMAL LOW (ref 11.1–15.9)
MCH: 29.8 pg (ref 26.6–33.0)
MCHC: 32.1 g/dL (ref 31.5–35.7)
MCV: 93 fL (ref 79–97)
Platelets: 276 10*3/uL (ref 150–450)
RBC: 3.56 x10E6/uL — ABNORMAL LOW (ref 3.77–5.28)
RDW: 14.9 % (ref 11.7–15.4)
WBC: 6.8 10*3/uL (ref 3.4–10.8)

## 2021-02-12 MED ORDER — CLOPIDOGREL BISULFATE 75 MG PO TABS: 75.0000 mg | ORAL_TABLET | Freq: Every day | ORAL | 3 refills | Status: DC

## 2021-02-12 NOTE — Patient Instructions (Signed)
Medication Instructions:  Your physician has recommended you make the following change in your medication: Finish current supply of Brilinta then change to Clopidogrel 75 mg by mouth daily  *If you need a refill on your cardiac medications before your next appointment, please call your pharmacy*   Lab Work: Lab work to be done today--CBC If you have labs (blood work) drawn today and your tests are completely normal, you will receive your results only by: [employer] (if you have MyChart) OR A paper copy in the mail If you have any lab test that is abnormal or we need to change your treatment, we will call you to review the results.   Testing/Procedures: none   Follow-Up: At [facility], you and your health needs are our priority.  As part of our continuing mission to provide you with exceptional heart care, we have created designated Provider Care Teams.  These Care Teams include your primary Cardiologist (physician) and Advanced Practice Providers (APPs -  Physician Assistants and Nurse Practitioners) who all work together to provide you with the care you need, when you need it.  We recommend signing up for the patient portal called "MyChart".  Sign up information is provided on this After Visit Summary.  MyChart is used to connect with patients for Virtual Visits (Telemedicine).  Patients are able to view lab/test results, encounter notes, upcoming appointments, etc.  Non-urgent messages can be sent to your provider as well.   To learn more about what you can do with MyChart, go to [email or web address].    Your next appointment:   6 month(s)  The format for your next appointment:   In Person  Provider:   You may see Larae Grooms, MD or one of the following Advanced Practice Providers on your designated Care Team:   Melina Copa, PA-C Ermalinda Barrios, PA-C   Other Instructions

## 2021-02-13 ENCOUNTER — Ambulatory Visit (INDEPENDENT_AMBULATORY_CARE_PROVIDER_SITE_OTHER): Payer: PPO

## 2021-02-13 VITALS — Ht 64.0 in | Wt 164.0 lb

## 2021-02-13 DIAGNOSIS — Z0000 Encounter for general adult medical examination without abnormal findings: Secondary | ICD-10-CM | POA: Diagnosis not present

## 2021-02-13 NOTE — Patient Instructions (Signed)
Abigail [name] , Thank you for taking time to come for your Medicare Wellness Visit. I appreciate your ongoing commitment to your health goals. Please review the following plan we discussed and let me know if I can assist you in the future.   Screening recommendations/referrals: Colonoscopy: not required Mammogram: not required Bone Density: 04/08/2002 Recommended yearly ophthalmology/optometry visit for glaucoma screening and checkup Recommended yearly dental visit for hygiene and checkup  Vaccinations: Influenza vaccine: due 03/25/2021 Pneumococcal vaccine: completed 09/01/2019 Tdap vaccine: completed 01/11/2020, due 01/10/2030 Shingles vaccine: completed   Covid-19:01/11/2021, 07/10/2020, 01/02/2020, 12/10/2019  Advanced directives: Advance directive discussed with you today.   Conditions/risks identified: none  Next appointment: Follow up in one year for your annual wellness visit    Preventive Care 83 Years and Older, Female Preventive care refers to lifestyle choices and visits with your health care provider that can promote health and wellness. What does preventive care include? A yearly physical exam. This is also called an annual well check. Dental exams once or twice a year. Routine eye exams. Ask your health care provider how often you should have your eyes checked. Personal lifestyle choices, including: Daily care of your teeth and gums. Regular physical activity. Eating a healthy diet. Avoiding tobacco and drug use. Limiting alcohol use. Practicing safe sex. Taking low-dose aspirin every day. Taking vitamin and mineral supplements as recommended by your health care provider. What happens during an annual well check? The services and screenings done by your health care provider during your annual well check will depend on your age, overall health, lifestyle risk factors, and family history of disease. Counseling  Your health care provider may ask you questions about  your: Alcohol use. Tobacco use. Drug use. Emotional well-being. Home and relationship well-being. Sexual activity. Eating habits. History of falls. Memory and ability to understand (cognition). Work and work Statistician. Reproductive health. Screening  You may have the following tests or measurements: Height, weight, and BMI. Blood pressure. Lipid and cholesterol levels. These may be checked every 5 years, or more frequently if you are over 18 years old. Skin check. Lung cancer screening. You may have this screening every year starting at age 36 if you have a 30-pack-year history of smoking and currently smoke or have quit within the past 15 years. Fecal occult blood test (FOBT) of the stool. You may have this test every year starting at age 83. Flexible sigmoidoscopy or colonoscopy. You may have a sigmoidoscopy every 5 years or a colonoscopy every 10 years starting at age 83. Hepatitis C blood test. Hepatitis B blood test. Sexually transmitted disease (STD) testing. Diabetes screening. This is done by checking your blood sugar (glucose) after you have not eaten for a while (fasting). You may have this done every 1-3 years. Bone density scan. This is done to screen for osteoporosis. You may have this done starting at age 9. Mammogram. This may be done every 1-2 years. Talk to your health care provider about how often you should have regular mammograms. Talk with your health care provider about your test results, treatment options, and if necessary, the need for more tests. Vaccines  Your health care provider may recommend certain vaccines, such as: Influenza vaccine. This is recommended every year. Tetanus, diphtheria, and acellular pertussis (Tdap, Td) vaccine. You may need a Td booster every 10 years. Zoster vaccine. You may need this after age 34. Pneumococcal 13-valent conjugate (PCV13) vaccine. One dose is recommended after age 24. Pneumococcal polysaccharide (PPSV23) vaccine.  One dose  is recommended after age 16. Talk to your health care provider about which screenings and vaccines you need and how often you need them. This information is not intended to replace advice given to you by your health care provider. Make sure you discuss any questions you have with your health care provider. Document Released: 09/07/2015 Document Revised: 04/30/2016 Document Reviewed: 06/12/2015 Elsevier Interactive Patient Education  2017 [place] Prevention in the Home Falls can cause injuries. They can happen to people of all ages. There are many things you can do to make your home safe and to help prevent falls. What can I do on the outside of my home? Regularly fix the edges of walkways and driveways and fix any cracks. Remove anything that might make you trip as you walk through a door, such as a raised step or threshold. Trim any bushes or trees on the path to your home. Use bright outdoor lighting. Clear any walking paths of anything that might make someone trip, such as rocks or tools. Regularly check to see if handrails are loose or broken. Make sure that both sides of any steps have handrails. Any raised decks and porches should have guardrails on the edges. Have any leaves, snow, or ice cleared regularly. Use sand or salt on walking paths during winter. Clean up any spills in your garage right away. This includes oil or grease spills. What can I do in the bathroom? Use night lights. Install grab bars by the toilet and in the tub and shower. Do not use towel bars as grab bars. Use non-skid mats or decals in the tub or shower. If you need to sit down in the shower, use a plastic, non-slip stool. Keep the floor dry. Clean up any water that spills on the floor as soon as it happens. Remove soap buildup in the tub or shower regularly. Attach bath mats securely with double-sided non-slip rug tape. Do not have throw rugs and other things on the floor that can make  you trip. What can I do in the bedroom? Use night lights. Make sure that you have a light by your bed that is easy to reach. Do not use any sheets or blankets that are too big for your bed. They should not hang down onto the floor. Have a firm chair that has side arms. You can use this for support while you get dressed. Do not have throw rugs and other things on the floor that can make you trip. What can I do in the kitchen? Clean up any spills right away. Avoid walking on wet floors. Keep items that you use a lot in easy-to-reach places. If you need to reach something above you, use a strong step stool that has a grab bar. Keep electrical cords out of the way. Do not use floor polish or wax that makes floors slippery. If you must use wax, use non-skid floor wax. Do not have throw rugs and other things on the floor that can make you trip. What can I do with my stairs? Do not leave any items on the stairs. Make sure that there are handrails on both sides of the stairs and use them. Fix handrails that are broken or loose. Make sure that handrails are as long as the stairways. Check any carpeting to make sure that it is firmly attached to the stairs. Fix any carpet that is loose or worn. Avoid having throw rugs at the top or bottom of the stairs. If  you do have throw rugs, attach them to the floor with carpet tape. Make sure that you have a light switch at the top of the stairs and the bottom of the stairs. If you do not have them, ask someone to add them for you. What else can I do to help prevent falls? Wear shoes that: Do not have high heels. Have rubber bottoms. Are comfortable and fit you well. Are closed at the toe. Do not wear sandals. If you use a stepladder: Make sure that it is fully opened. Do not climb a closed stepladder. Make sure that both sides of the stepladder are locked into place. Ask someone to hold it for you, if possible. Clearly mark and make sure that you can  see: Any grab bars or handrails. First and last steps. Where the edge of each step is. Use tools that help you move around (mobility aids) if they are needed. These include: Canes. Walkers. Scooters. Crutches. Turn on the lights when you go into a dark area. Replace any light bulbs as soon as they burn out. Set up your furniture so you have a clear path. Avoid moving your furniture around. If any of your floors are uneven, fix them. If there are any pets around you, be aware of where they are. Review your medicines with your doctor. Some medicines can make you feel dizzy. This can increase your chance of falling. Ask your doctor what other things that you can do to help prevent falls. This information is not intended to replace advice given to you by your health care provider. Make sure you discuss any questions you have with your health care provider. Document Released: 06/07/2009 Document Revised: 01/17/2016 Document Reviewed: 09/15/2014 Elsevier Interactive Patient Education  2017 [employer].

## 2021-02-13 NOTE — Progress Notes (Signed)
I connected with Abigail [name] today by telephone and verified that I am speaking with the correct person using two identifiers. Location patient: home Location provider: work Persons participating in the virtual visit: Abigail [name], Glenna Durand LPN.   I discussed the limitations, risks, security and privacy concerns of performing an evaluation and management service by telephone and the availability of in person appointments. I also discussed with the patient that there may be a patient responsible charge related to this service. The patient expressed understanding and verbally consented to this telephonic visit.    Interactive audio and video telecommunications were attempted between this provider and patient, however failed, due to patient having technical difficulties OR patient did not have access to video capability.  We continued and completed visit with audio only.     Vital signs may be patient reported or missing.  Subjective:   Abigail [name] is a 85 y.o. female who presents for Medicare Annual (Subsequent) preventive examination.  Review of Systems     Cardiac Risk Factors include: advanced age (>24men, >71 women);dyslipidemia;hypertension;sedentary lifestyle     Objective:    Today's Vitals   02/13/21 1041  Weight: 164 lb (74.4 kg)  Height: 5\' 4"  (1.626 m)   Body mass index is 28.15 kg/m.  Advanced Directives 02/13/2021 12/20/2020 11/09/2020 11/06/2020 01/11/2020 12/01/2019 10/27/2019  Does Patient Have a Medical Advance Directive? No No No No No No No  Would patient like information on creating a medical advance directive? - No - Patient declined No - Patient declined No - Patient declined No - Patient declined No - Patient declined Yes (Inpatient - patient requests chaplain consult to create a medical advance directive)    Current Medications (verified) Outpatient Encounter Medications as of 02/13/2021  Medication Sig   acetaminophen (TYLENOL) 325 MG tablet Take 2 tablets  (650 mg total) by mouth every 6 (six) hours as needed for mild pain (or Fever >/= 101).   aspirin EC 81 MG tablet Take 81 mg by mouth daily.   atorvastatin (LIPITOR) 80 MG tablet Take 1 tablet (80 mg total) by mouth daily at 6 PM.   clopidogrel (PLAVIX) 75 MG tablet Take 1 tablet (75 mg total) by mouth daily.   Cranberry 1000 MG CAPS Take 1,000 mg by mouth daily at 12 noon.   docusate sodium (COLACE) 100 MG capsule Take 100-200 mg by mouth See admin instructions. Take 100 mg in the morning and 200 mg at bedtime   estradiol (ESTRACE) 0.1 MG/GM vaginal cream Place 0.5 g vaginally 2 (two) times a week. Place 0.5g twice a week at opening of vagina (Patient taking differently: Place 0.5 g vaginally daily. Place 0.5g twice a week at opening of vagina)   levothyroxine (SYNTHROID) 175 MCG tablet TAKE 1 TABLET BY MOUTH EVERY MORNING ON MONDAY - FRIDAY   lisinopril-hydrochlorothiazide (ZESTORETIC) 20-25 MG tablet Take 0.5 tablets by mouth 3 (three) times a week. Take on Monday Wednesday and Friday only   nitroGLYCERIN (NITROSTAT) 0.4 MG SL tablet Place 1 tablet (0.4 mg total) under the tongue every 5 (five) minutes x 3 doses as needed for chest pain.   Trospium Chloride 60 MG CP24 Take 1 capsule (60 mg total) by mouth daily.   cyclobenzaprine (FLEXERIL) 10 MG tablet Take 1 tablet (10 mg total) by mouth 3 (three) times daily as needed for muscle spasms. (Patient not taking: Reported on 02/13/2021)   ferrous sulfate 300 (60 Fe) MG/5ML syrup Take 5 mLs (300 mg total) by mouth  2 (two) times daily with a meal.   No facility-administered encounter medications on file as of 02/13/2021.    Allergies (verified) Codeine and Norvasc [amlodipine]   History: Past Medical History:  Diagnosis Date   Anemia    Arthritis    knees, back   Carotid artery occlusion    Chronic kidney disease    stage 3 ckd no nephrologist   Complete uterine prolapse with prolapse of anterior vaginal wall    Complication of anesthesia     hard to wake up per pt   Constipation    Coronary artery disease    Diverticulitis yrs ago coialitis   History of blood transfusion    Hypertension    Hypothyroid    Numbness    in hands at times   Pre-diabetes    Scoliosis    STEMI (ST elevation myocardial infarction) ([place]) 10/26/2019   DES RCA   Wears dentures    full dentures   Wears glasses    for reading   Past Surgical History:  Procedure Laterality Date   ANTERIOR AND POSTERIOR REPAIR WITH SACROSPINOUS FIXATION N/A 12/20/2020   Procedure: SACROSPINOUS LIGAMENT FIXATION;  Surgeon: [name], MD;  [facility];  Service: Gynecology;  Laterality: N/A;  Total time requested for all procedures is 2 hours   [place] age 32   lower   bartholin cyst removal  age 37's   BLADDER SUSPENSION N/A 12/20/2020   Procedure: TRANSVAGINAL TAPE (TVT) PROCEDURE;  Surgeon: [name], MD;  Location: [facility];  Service: Gynecology;  Laterality: N/A;   CORONARY/GRAFT ACUTE MI REVASCULARIZATION N/A 10/26/2019   Procedure: Coronary/Graft Acute MI Revascularization;  Surgeon: Sherren Mocha, MD;  Location: [place] [facility];  Service: Cardiovascular;  Laterality: N/A;   CYSTOCELE REPAIR N/A 12/20/2020   Procedure: ANTERIOR AND POSTERIOR REPAIR WITH PERINEORRHAPHY;  Surgeon: [name], MD;  Location: [facility];  Service: Gynecology;  Laterality: N/A;   CYSTOSCOPY N/A 12/20/2020   Procedure: CYSTOSCOPY;  Surgeon: [name], MD;  Location: [facility];  Service: Gynecology;  Laterality: N/A;   ELBOW SURGERY  1990's   left   LEFT HEART CATH AND CORONARY ANGIOGRAPHY N/A 10/26/2019   Procedure: LEFT HEART CATH AND CORONARY ANGIOGRAPHY;  Surgeon: Sherren Mocha, MD;  Location: [place] [facility];  Service: Cardiovascular;  Laterality: N/A;   Family History  Problem Relation Age of Onset   Hypertension Mother    Social  History   Socioeconomic History   Marital status: Widowed    Spouse name: Not on file   Number of children: Not on file   Years of education: Not on file   Highest education level: Not on file  Occupational History   Occupation: semi retired  Tobacco Use   Smoking status: Former    Packs/day: 1.50    Years: 35.00    Pack years: 52.50    Types: Cigarettes    Quit date: 04/05/1986    Years since quitting: 34.8   Smokeless tobacco: Never  Vaping Use   Vaping Use: Never used  Substance and Sexual Activity   Alcohol use: No   Drug use: No   Sexual activity: Not Currently    Birth control/protection: Post-menopausal  Other Topics Concern   Not on file  Social History Narrative   Not on file   Social Determinants of Health   Financial Resource Strain: Low Risk  Difficulty of Paying Living Expenses: Not hard at all  Food Insecurity: No Food Insecurity   Worried About [place] in the Last Year: Never true   Ran Out of Food in the Last Year: Never true  Transportation Needs: No Transportation Needs   Lack of Transportation (Medical): No   Lack of Transportation (Non-Medical): No  Physical Activity: Inactive   Days of Exercise per Week: 0 days   Minutes of Exercise per Session: 0 min  Stress: No Stress Concern Present   Feeling of Stress : Not at all  Social Connections: Not on file    Tobacco Counseling Counseling given: Not Answered   Clinical Intake:  Pre-visit preparation completed: Yes  Pain : No/denies pain     Nutritional Status: BMI 25 -29 Overweight Nutritional Risks: None Diabetes: No  How often do you need to have someone help you when you read instructions, pamphlets, or other written materials from your doctor or pharmacy?: 1 - Never  Diabetic? no  Interpreter Needed?: No  Information entered by :: NAlklen LPN   Activities of Daily Living In your present state of health, do you have any difficulty performing the following  activities: 02/13/2021 12/20/2020  Hearing? N N  Vision? N N  Difficulty concentrating or making decisions? N N  Walking or climbing stairs? N N  Dressing or bathing? N N  Doing errands, shopping? N -  Preparing Food and eating ? N -  Using the Toilet? N -  In the past six months, have you accidently leaked urine? N -  Do you have problems with loss of bowel control? N -  Managing your Medications? N -  Managing your Finances? N -  Housekeeping or managing your Housekeeping? N -  Some recent data might be hidden    Patient Care Team: [name], FNP as PCP - General ([place]) Jettie Booze, MD as PCP - Cardiology (Cardiology) Rex Kras, Claudette Stapler, RN as [place] any recent Medical Services you may have received from other than [facility] providers in the past year (date may be approximate).     Assessment:   This is a routine wellness examination for [facility].  Hearing/Vision screen Vision Screening - Comments:: No regular eye exams,  Dietary issues and exercise activities discussed: Current Exercise Habits: The patient does not participate in regular exercise at present   Goals Addressed             This Visit's Progress    Patient Stated       02/13/2021, wants to weigh 140 pounds        Depression Screen PHQ 2/9 Scores 02/13/2021 01/11/2020 11/29/2019 06/30/2019 02/09/2019 12/29/2018 12/29/2018  PHQ - 2 Score 0 0 0 0 3 0 0  PHQ- 9 Score - 0 - - 5 0 -    Fall Risk Fall Risk  02/13/2021 01/11/2020 11/29/2019 06/30/2019 02/09/2019  Falls in the past year? 0 0 0 0 0  Comment - - - - -  Risk for fall due to : Medication side effect Medication side effect;Impaired balance/gait - - -  Follow up Falls evaluation completed;Education provided;Falls prevention discussed Falls evaluation completed;Education provided;Falls prevention discussed - - -    FALL RISK PREVENTION PERTAINING TO THE HOME:  Any stairs in or around the home? No   If so, are there any without handrails? N/a Home free of loose throw rugs in walkways, pet beds, electrical cords, etc? Yes  Adequate lighting in your home to reduce risk of falls? Yes   ASSISTIVE DEVICES UTILIZED TO PREVENT FALLS:  Life alert? No  Use of a cane, walker or w/c? No  Grab bars in the bathroom? Yes  Shower chair or bench in shower? Yes  Elevated toilet seat or a handicapped toilet? Yes   TIMED UP AND GO:  Was the test performed? No .      Cognitive Function:     6CIT Screen 02/13/2021 01/11/2020 12/29/2018  What Year? 0 points 0 points 0 points  What month? 0 points 0 points 0 points  What time? 0 points 0 points 3 points  Count back from 20 0 points 0 points 0 points  Months in reverse 0 points 0 points 0 points  Repeat phrase 2 points 0 points 0 points  Total Score 2 0 3    Immunizations Immunization History  Administered Date(s) Administered   Influenza Nasal 06/28/2019   Influenza, High Dose Seasonal PF 06/18/2018   Influenza-Unspecified 05/14/2015, 04/25/2017, 06/19/2018   PFIZER(Purple Top)SARS-COV-2 Vaccination 12/10/2019, 01/02/2020, 07/10/2020, 01/11/2021   Pneumococcal Conjugate-13 09/01/2019   Pneumococcal-Unspecified 08/24/2012   Tdap 02/17/2008, 01/11/2020   Zoster Recombinat (Shingrix) 06/28/2019, 10/05/2019    TDAP status: Up to date  Flu Vaccine status: Up to date  Pneumococcal vaccine status: Up to date  Covid-19 vaccine status: Completed vaccines  Qualifies for Shingles Vaccine? Yes   Zostavax completed No   Shingrix Completed?: Yes  Screening Tests Health Maintenance  Topic Date Due   INFLUENZA VACCINE  03/25/2021   TETANUS/TDAP  01/10/2030   DEXA SCAN  Completed   COVID-19 Vaccine  Completed   PNA vac Low Risk Adult  Completed   Zoster Vaccines- Shingrix  Completed   HPV VACCINES  Aged Out    Health Maintenance  There are no preventive care reminders to display for this patient.   Colorectal cancer screening: No  longer required.   Mammogram status: No longer required due to age.  Bone Density status: Completed 04/08/2012.   Lung Cancer Screening: (Low Dose CT Chest recommended if Age 18-80 years, 30 pack-year currently smoking OR have quit w/in 15years.) does not qualify.   Lung Cancer Screening Referral: no  Additional Screening:  Hepatitis C Screening: does not qualify;   Vision Screening: Recommended annual ophthalmology exams for early detection of glaucoma and other disorders of the eye. Is the patient up to date with their annual eye exam?  No  Who is the provider or what is the name of the office in which the patient attends annual eye exams? none If pt is not established with a provider, would they like to be referred to a provider to establish care? No .   Dental Screening: Recommended annual dental exams for proper oral hygiene  Community Resource Referral / Chronic Care Management: CRR required this visit?  No   CCM required this visit?  No      Plan:     I have personally reviewed and noted the following in the patient's chart:   Medical and social history Use of alcohol, tobacco or illicit drugs  Current medications and supplements including opioid prescriptions.  Functional ability and status Nutritional status Physical activity Advanced directives List of other physicians Hospitalizations, surgeries, and ER visits in previous 12 months Vitals Screenings to include cognitive, depression, and falls Referrals and appointments  In addition, I have reviewed and discussed with patient certain preventive protocols, quality metrics, and best practice recommendations. A written  personalized care plan for preventive services as well as general preventive health recommendations were provided to patient.     Kellie Simmering, LPN   4/86/2824   Nurse Notes:

## 2021-02-14 ENCOUNTER — Other Ambulatory Visit: Payer: Self-pay | Admitting: *Deleted

## 2021-02-14 MED ORDER — FERROUS SULFATE 300 (60 FE) MG/5ML PO SYRP: 300.0000 mg | ORAL_SOLUTION | Freq: Two times a day (BID) | ORAL | 11 refills | Status: DC

## 2021-02-14 NOTE — Progress Notes (Signed)
OK to refill ferrous sulfate per Dr Irish Lack

## 2021-02-27 ENCOUNTER — Telehealth: Payer: PPO

## 2021-02-27 ENCOUNTER — Ambulatory Visit (INDEPENDENT_AMBULATORY_CARE_PROVIDER_SITE_OTHER): Payer: PPO

## 2021-02-27 ENCOUNTER — Telehealth: Payer: Self-pay | Admitting: Interventional Cardiology

## 2021-02-27 DIAGNOSIS — E782 Mixed hyperlipidemia: Secondary | ICD-10-CM

## 2021-02-27 DIAGNOSIS — I10 Essential (primary) hypertension: Secondary | ICD-10-CM

## 2021-02-27 DIAGNOSIS — E039 Hypothyroidism, unspecified: Secondary | ICD-10-CM

## 2021-02-27 DIAGNOSIS — F419 Anxiety disorder, unspecified: Secondary | ICD-10-CM

## 2021-02-27 DIAGNOSIS — R7303 Prediabetes: Secondary | ICD-10-CM

## 2021-02-27 NOTE — Telephone Encounter (Signed)
New Message:    Pt needs an order from Dr Irish Lack for In Home PT.please.

## 2021-02-27 NOTE — Telephone Encounter (Signed)
Left message to call office

## 2021-02-27 NOTE — Progress Notes (Signed)
This encounter was created in error - please disregard.

## 2021-02-28 NOTE — Telephone Encounter (Signed)
Reviewed with Dr Irish Lack and he will order PT I spoke with Glenard Haring and let her know Dr Irish Lack would order PT.  Number for [facility] is [phone].   I spoke with [facility] and they will need order, insurance information and progress notes faxed to them.  Fax- [phone].

## 2021-02-28 NOTE — Telephone Encounter (Signed)
Abigail [name] is following up, requesting to speak with Dr. Hassell Done nurse regarding in home PT order. She states patient is not cleared RTW until 04/24/21. Please return call to discuss when able.

## 2021-02-28 NOTE — Telephone Encounter (Signed)
Information faxed to [facility]

## 2021-02-28 NOTE — Telephone Encounter (Signed)
I spoke with Abigail [name] who is calling to see if Dr Irish Lack would order PT for patient as he has seen her recently. Patient feels very deconditioned since events that have occurred since March.  Angel recommends [facility] or [facility] for PT.  Diagnosis would be decreased endurance and assist with strengthening.

## 2021-03-05 NOTE — Chronic Care Management (AMB) (Signed)
Chronic Care Management   CCM RN Visit Note  02/27/2021 Name: Abigail [name] MRN: [id number] DOB: 02-Mar-1936  Subjective: Abigail [name] is a 85 y.o. year old female who is a primary care patient of [name], [place]. The care management team was consulted for assistance with disease management and care coordination needs.    Engaged with patient by telephone for follow up visit in response to provider referral for case management and/or care coordination services.   Consent to Services:  The patient was given information about Chronic Care Management services, agreed to services, and gave verbal consent prior to initiation of services.  Please see initial visit note for detailed documentation.   Patient agreed to services and verbal consent obtained.   Assessment: Review of patient past medical history, allergies, medications, health status, including review of consultants reports, laboratory and other test data, was performed as part of comprehensive evaluation and provision of chronic care management services.   SDOH (Social Determinants of Health) assessments and interventions performed:  Yes, no acute challenges   CCM Care Plan  Allergies  Allergen Reactions   Codeine Nausea And Vomiting   Norvasc [Amlodipine] Rash and Other (See Comments)    rash    Outpatient Encounter Medications as of 02/27/2021  Medication Sig   acetaminophen (TYLENOL) 325 MG tablet Take 2 tablets (650 mg total) by mouth every 6 (six) hours as needed for mild pain (or Fever >/= 101).   aspirin EC 81 MG tablet Take 81 mg by mouth daily.   atorvastatin (LIPITOR) 80 MG tablet Take 1 tablet (80 mg total) by mouth daily at 6 PM.   clopidogrel (PLAVIX) 75 MG tablet Take 1 tablet (75 mg total) by mouth daily.   Cranberry 1000 MG CAPS Take 1,000 mg by mouth daily at 12 noon.   cyclobenzaprine (FLEXERIL) 10 MG tablet Take 1 tablet (10 mg total) by mouth 3 (three) times daily as needed for muscle spasms. (Patient not  taking: Reported on 02/13/2021)   docusate sodium (COLACE) 100 MG capsule Take 100-200 mg by mouth See admin instructions. Take 100 mg in the morning and 200 mg at bedtime   estradiol (ESTRACE) 0.1 MG/GM vaginal cream Place 0.5 g vaginally 2 (two) times a week. Place 0.5g twice a week at opening of vagina (Patient taking differently: Place 0.5 g vaginally daily. Place 0.5g twice a week at opening of vagina)   ferrous sulfate 300 (60 Fe) MG/5ML syrup Take 5 mLs (300 mg total) by mouth 2 (two) times daily with a meal.   levothyroxine (SYNTHROID) 175 MCG tablet TAKE 1 TABLET BY MOUTH EVERY MORNING ON MONDAY - FRIDAY   lisinopril-hydrochlorothiazide (ZESTORETIC) 20-25 MG tablet Take 0.5 tablets by mouth 3 (three) times a week. Take on Monday Wednesday and Friday only   nitroGLYCERIN (NITROSTAT) 0.4 MG SL tablet Place 1 tablet (0.4 mg total) under the tongue every 5 (five) minutes x 3 doses as needed for chest pain.   Trospium Chloride 60 MG CP24 Take 1 capsule (60 mg total) by mouth daily.   No facility-administered encounter medications on file as of 02/27/2021.    Patient Active Problem List   Diagnosis Date Noted   Occult blood positive stool 11/09/2020   Chest pain with moderate risk for cardiac etiology 12/01/2019   Urinary urgency 12/01/2019   STEMI involving right coronary artery ([place]) 10/27/2019   STEMI (ST elevation myocardial infarction) ([place]) 10/26/2019   Anxiety 02/09/2019   Prediabetes 12/29/2018   Essential hypertension  08/27/2018   Vitamin D deficiency 06/25/2018   Hyperlipidemia 06/25/2018   Hypothyroidism 06/25/2018   Benign hypertension with CKD (chronic kidney disease) stage III ([place]) 06/25/2018   Chronic kidney disease, stage III (moderate) ([place]) 06/25/2018   OAB (overactive bladder) 08/26/2016   CAP (community acquired pneumonia) 04/12/2012   Bacteremia 04/08/2012   LLQ abdominal pain 04/05/2012   Hyponatremia 04/05/2012    Conditions to be addressed/monitored:  Acquired hypothyroidism, Anxiety, Essential hypertension, Prediabetes, Hyperlipidemia  Care Plan : Urinary Incontinence (Adult)  Updates made by Lynne Logan, RN since 02/27/2021 12:00 AM     Problem: Symptom Management (Urinary Incontinence)      Long-Range Goal: Urinary Incontinence Symptoms Manged   Start Date: 02/27/2021  Expected End Date: 01/28/2022  This Visit's Progress: On track  Priority: High  Note:    Current Barriers:  Ineffective Self Health Maintenance in a patient with Acquired hypothyroidism, Anxiety, Essential hypertension, Prediabetes, Hyperlipidemia Clinical Goal(s):  Collaboration with [name], FNP regarding development and update of comprehensive plan of care as evidenced by provider attestation and co-signature Inter-disciplinary care team collaboration (see longitudinal plan of care) patient will work with care management team to address care coordination and chronic disease management needs related to Disease Management Educational Needs Care Coordination Medication Management and Education Psychosocial Support   Interventions:  02/27/21 completed successful outbound call with patient  Evaluation of current treatment plan related to Acquired hypothyroidism, Anxiety, Essential hypertension, Prediabetes, Hyperlipidemia self-management and patient's adherence to plan as established by provider. Collaboration with [name], FNP regarding development and update of comprehensive plan of care as evidenced by provider attestation       and co-signature Inter-disciplinary care team collaboration (see longitudinal plan of care) Determined patient experienced a recent inpatient admission due to dropping her hemoglobin following a uterine bleed after receiving multiple pessary devices Reviewed and discussed patient's understanding of her discharge instructions, patient verbalizes understanding Reviewed medications with patient and discussed importance of  medication adherence Determined patient continues to experience fatigue and feels deconditioned following the inpatient admission Determined patient feels she will benefit from in home PT to help with endurance and strengthening Collaborated with [place] to request orders from most recent MD, Dr. Acie Fredrickson requesting in home PT, orders will be sent to [facility] or [facility] per nurse PAT  Reviewed scheduled/upcoming provider appointments including: next MD follow up with [facility], [name], MD, scheduled for: 03/11/21 @2 :20 PM  Educated patient on importance of reporting vaginal bleeding and or other symptoms suggestive of anemia promptly in order to seek medical attention promptly Reviewed s/s suggestive of acute bleeding and or anemia secondary to acute bleed Educated on importance of staying well hydrated, taking oral iron supplement exactly as prescribed and balancing activity with rest  Discussed plans with patient for ongoing care management follow up and provided patient with direct contact information for care management team Self Care Activities:  Self administers medications as prescribed Attends all scheduled provider appointments Calls pharmacy for medication refills Calls provider office for new concerns or questions Patient Goals: -Urinary Incontinence Symptoms Manged  Follow Up Plan: Telephone follow up appointment with care management team member scheduled for: 03/08/21     Plan:Telephone follow up appointment with care management team member scheduled for:  03/08/21  Barb Merino, RN, BSN, CCM Care Management Coordinator [place] [facility]  Direct Phone: [phone]

## 2021-03-05 NOTE — Patient Instructions (Signed)
Goals Addressed      Urinary Incontinence Symptoms Manged   On track    Timeframe:  Long-Range Goal Priority:  High Start Date:  02/27/21                           Expected End Date: 08/30/21  Next Scheduled Follow up date: 03/08/21     Self Care Activities:  Self administers medications as prescribed Attends all scheduled provider appointments Calls pharmacy for medication refills Calls provider office for new concerns or questions Patient Goals: -Urinary Incontinence Symptoms Manged

## 2021-03-06 ENCOUNTER — Telehealth: Payer: PPO

## 2021-03-06 ENCOUNTER — Ambulatory Visit: Payer: Self-pay

## 2021-03-06 DIAGNOSIS — R7303 Prediabetes: Secondary | ICD-10-CM

## 2021-03-06 DIAGNOSIS — I10 Essential (primary) hypertension: Secondary | ICD-10-CM

## 2021-03-06 DIAGNOSIS — E782 Mixed hyperlipidemia: Secondary | ICD-10-CM

## 2021-03-06 DIAGNOSIS — E039 Hypothyroidism, unspecified: Secondary | ICD-10-CM

## 2021-03-06 DIAGNOSIS — F419 Anxiety disorder, unspecified: Secondary | ICD-10-CM

## 2021-03-11 ENCOUNTER — Other Ambulatory Visit: Payer: Self-pay

## 2021-03-11 ENCOUNTER — Ambulatory Visit (INDEPENDENT_AMBULATORY_CARE_PROVIDER_SITE_OTHER): Payer: PPO | Admitting: Obstetrics and Gynecology

## 2021-03-11 ENCOUNTER — Encounter: Payer: Self-pay | Admitting: Obstetrics and Gynecology

## 2021-03-11 VITALS — BP 160/81 | HR 66

## 2021-03-11 DIAGNOSIS — R350 Frequency of micturition: Secondary | ICD-10-CM

## 2021-03-11 DIAGNOSIS — N390 Urinary tract infection, site not specified: Secondary | ICD-10-CM | POA: Diagnosis not present

## 2021-03-11 DIAGNOSIS — N3000 Acute cystitis without hematuria: Secondary | ICD-10-CM

## 2021-03-11 LAB — POCT URINALYSIS DIPSTICK
Appearance: ABNORMAL
Bilirubin, UA: NEGATIVE
Blood, UA: NEGATIVE
Glucose: NEGATIVE
Ketones, UA: NEGATIVE
Nitrite, UA: POSITIVE
Protein, UA: POSITIVE — AB
Spec Grav, UA: 1.02 (ref 1.010–1.025)
Urobilinogen, UA: NEGATIVE E.U./dL — AB
pH, UA: 5.5 (ref 5.0–8.0)

## 2021-03-11 MED ORDER — SULFAMETHOXAZOLE-TRIMETHOPRIM 800-160 MG PO TABS: 1.0000 | ORAL_TABLET | Freq: Two times a day (BID) | ORAL | 0 refills | Status: AC

## 2021-03-11 MED ORDER — SULFAMETHOXAZOLE-TRIMETHOPRIM 400-80 MG PO TABS: 0.5000 | ORAL_TABLET | Freq: Every day | ORAL | 5 refills | Status: DC

## 2021-03-11 NOTE — Progress Notes (Signed)
[facility]  Date of Visit: 03/11/2021  History of Present Illness: Ms. Goin is a 85 y.o. female scheduled today for a post-operative visit.   Surgery: s/p anterior and posterior repair with perineorrhaphy, sacrospinous ligament fixation, midurethral sling and cystoscopy   on 12/20/2020  She did not pass her initial postoperative void trial but passed repeat VT on 12/24/20.   Postoperative course has been uncomplicated up until a few days ago. She feels some chills and urine has an odor. She is concerned she has a UTI.   Pain? No  Vaginal bulge? No  Stress incontinence: No  Urgency/frequency: No  Urge incontinence: No  Voiding dysfunction: No  Bowel issues: No   Subjective Success: Do you usually have a bulge or something falling out that you can see or feel in the vaginal area? No  Retreatment Success: Any retreatment with surgery or pessary for any compartment? No    Medications: She has a current medication list which includes the following prescription(s): acetaminophen, aspirin ec, atorvastatin, clopidogrel, cranberry, cyclobenzaprine, docusate sodium, estradiol, ferrous sulfate, levothyroxine, lisinopril-hydrochlorothiazide, nitroglycerin, sulfamethoxazole-trimethoprim, sulfamethoxazole-trimethoprim, and trospium chloride.   Allergies: Patient is allergic to codeine and norvasc [amlodipine].   Physical Exam: BP (!) 160/81   Pulse 66   Pelvic Examination: Vagina: Incisions healing well. Sutures are not present at incision line and there is not granulation tissue. No tenderness along the anterior or posterior vagina. No apical tenderness. No pelvic masses. No visible or palpable mesh.  POP-Q: POP-Q  -0.5                                            Aa   -0.5                                           Ba  -6                                              C   3                                            Gh  3                                            Pb  6                                             tvl   -3                                            Ap  -3  Bp                                                 D    ---------------------------------------------------------  Assessment and Plan:  1. Acute cystitis without hematuria   2. Recurrent UTI   3. Urinary frequency     - For UTI, will treat with Bactrim DS 1 tab BID x3 days. Then for recurrent UTI, she will take 1 tab daily for 6 months to prevent infections. Discussed restarting vaginal estrogen 0.5g twice a week for infection prevention. - Can resume regular activity.  - Discussed avoidance of heavy lifting and straining long term to reduce the risk of recurrence.   Return 2 months  [name], MD  Time spent: I spent 25 minutes dedicated to the care of this patient on the date of this encounter to include pre-visit review of records, face-to-face time with the patient and post visit documentation and ordering medication/ testing.

## 2021-03-12 NOTE — Chronic Care Management (AMB) (Signed)
Chronic Care Management   CCM RN Visit Note  03/06/2021 Name: Abigail [name] MRN: [id number] DOB: 1935/09/10  Subjective: Abigail [name] is a 85 y.o. year old female who is a primary care patient of [name], [place]. The care management team was consulted for assistance with disease management and care coordination needs.    Engaged with patient by telephone for follow up visit in response to provider referral for case management and/or care coordination services.   Consent to Services:  The patient was given information about Chronic Care Management services, agreed to services, and gave verbal consent prior to initiation of services.  Please see initial visit note for detailed documentation.   Patient agreed to services and verbal consent obtained.   Assessment: Review of patient past medical history, allergies, medications, health status, including review of consultants reports, laboratory and other test data, was performed as part of comprehensive evaluation and provision of chronic care management services.   SDOH (Social Determinants of Health) assessments and interventions performed:  Yes, no acute challenges   CCM Care Plan  Allergies  Allergen Reactions   Codeine Nausea And Vomiting   Norvasc [Amlodipine] Rash and Other (See Comments)    rash    Outpatient Encounter Medications as of 03/06/2021  Medication Sig   acetaminophen (TYLENOL) 325 MG tablet Take 2 tablets (650 mg total) by mouth every 6 (six) hours as needed for mild pain (or Fever >/= 101).   aspirin EC 81 MG tablet Take 81 mg by mouth daily.   atorvastatin (LIPITOR) 80 MG tablet Take 1 tablet (80 mg total) by mouth daily at 6 PM.   clopidogrel (PLAVIX) 75 MG tablet Take 1 tablet (75 mg total) by mouth daily.   Cranberry 1000 MG CAPS Take 1,000 mg by mouth daily at 12 noon.   cyclobenzaprine (FLEXERIL) 10 MG tablet Take 1 tablet (10 mg total) by mouth 3 (three) times daily as needed for muscle spasms.   docusate  sodium (COLACE) 100 MG capsule Take 100-200 mg by mouth See admin instructions. Take 100 mg in the morning and 200 mg at bedtime   estradiol (ESTRACE) 0.1 MG/GM vaginal cream Place 0.5 g vaginally 2 (two) times a week. Place 0.5g twice a week at opening of vagina (Patient taking differently: Place 0.5 g vaginally daily. Place 0.5g twice a week at opening of vagina)   ferrous sulfate 300 (60 Fe) MG/5ML syrup Take 5 mLs (300 mg total) by mouth 2 (two) times daily with a meal.   levothyroxine (SYNTHROID) 175 MCG tablet TAKE 1 TABLET BY MOUTH EVERY MORNING ON MONDAY - FRIDAY   lisinopril-hydrochlorothiazide (ZESTORETIC) 20-25 MG tablet Take 0.5 tablets by mouth 3 (three) times a week. Take on Monday Wednesday and Friday only   nitroGLYCERIN (NITROSTAT) 0.4 MG SL tablet Place 1 tablet (0.4 mg total) under the tongue every 5 (five) minutes x 3 doses as needed for chest pain.   Trospium Chloride 60 MG CP24 Take 1 capsule (60 mg total) by mouth daily.   No facility-administered encounter medications on file as of 03/06/2021.    Patient Active Problem List   Diagnosis Date Noted   Occult blood positive stool 11/09/2020   Chest pain with moderate risk for cardiac etiology 12/01/2019   Urinary urgency 12/01/2019   STEMI involving right coronary artery ([place]) 10/27/2019   STEMI (ST elevation myocardial infarction) ([place]) 10/26/2019   Anxiety 02/09/2019   Prediabetes 12/29/2018   Essential hypertension 08/27/2018   Vitamin D deficiency  06/25/2018   Hyperlipidemia 06/25/2018   Hypothyroidism 06/25/2018   Benign hypertension with CKD (chronic kidney disease) stage III ([place]) 06/25/2018   Chronic kidney disease, stage III (moderate) ([place]) 06/25/2018   OAB (overactive bladder) 08/26/2016   CAP (community acquired pneumonia) 04/12/2012   Bacteremia 04/08/2012   LLQ abdominal pain 04/05/2012   Hyponatremia 04/05/2012    Conditions to be addressed/monitored: Acquired hypothyroidism, Anxiety, Essential  hypertension, Prediabetes, Hyperlipidemia  Care Plan : Wellness (Adult)  Updates made by Lynne Logan, RN since 03/06/2021 12:00 AM     Problem: Weakness and Fatigue   Priority: High     Long-Range Goal: Weakness and Fatigue improved and or resolved   Start Date: 03/06/2021  Expected End Date: 09/06/2021  Priority: High  Note:   Current Barriers:  Ineffective Self Health Maintenance in a patient with Acquired hypothyroidism, Anxiety, Essential hypertension, Prediabetes, Hyperlipidemia Clinical Goal(s):  Collaboration with [name], FNP regarding development and update of comprehensive plan of care as evidenced by provider attestation and co-signature Inter-disciplinary care team collaboration (see longitudinal plan of care) patient will work with care management team to address care coordination and chronic disease management needs related to Disease Management Educational Needs Care Coordination Medication Management and Education Psychosocial Support   Interventions:  03/06/21 completed successful outbound call with patient  Evaluation of current treatment plan related to Acquired hypothyroidism, Anxiety, Essential hypertension, Prediabetes, Hyperlipidemia, self-management and patient's adherence to plan as established by provider. Collaboration with [name], FNP regarding development and update of comprehensive plan of care as evidenced by provider attestation       and co-signature Inter-disciplinary care team collaboration (see longitudinal plan of care) Advised patient Pat RN with Dr. Irish Lack called to confirm Dr. Irish Lack has approved her in home PT and the order was sent to [facility] patient will follow up with Dr. Wannetta Sender scheduled for 03/11/21 @2 :20 PM, to get clearance for PT Encouraged patient to balance her activity with rest, try to get a full nights sleep, eat a well balanced diet and drink at least 64 oz of water daily unless  otherwise directed  Discussed plans with patient for ongoing care management follow up and provided patient with direct contact information for care management team Self Care Activities:  Patient verbalizes understanding of plan to schedule in home PT with [facility] administers medications as prescribed Attends all scheduled provider appointments Calls pharmacy for medication refills Calls provider office for new concerns or questions Patient Goals: - work with PT to regain endurance and stamina  Follow Up Plan: Telephone follow up appointment with care management team member scheduled for: 07/12/21     Plan:Telephone follow up appointment with care management team member scheduled for:  07/12/21  Barb Merino, RN, BSN, CCM Care Management Coordinator [facility]  Direct Phone: [phone] '

## 2021-03-12 NOTE — Patient Instructions (Signed)
Goals Addressed      Weakness and Fatigue improved and or resolved       Timeframe:  Long-Range Goal Priority:  High Start Date:  03/06/21                           Expected End Date:  09/06/21  Next Scheduled follow up: 07/12/21       Self Care Activities:  Patient verbalizes understanding of plan to schedule in home PT with [facility]  Self administers medications as prescribed Attends all scheduled provider appointments Calls pharmacy for medication refills Calls provider office for new concerns or questions Patient Goals: - work with PT to regain endurance and stamina

## 2021-03-13 LAB — URINE CULTURE

## 2021-04-03 ENCOUNTER — Other Ambulatory Visit: Payer: Self-pay | Admitting: Nurse Practitioner

## 2021-04-03 DIAGNOSIS — E039 Hypothyroidism, unspecified: Secondary | ICD-10-CM

## 2021-04-10 ENCOUNTER — Telehealth: Payer: Self-pay

## 2021-04-10 ENCOUNTER — Telehealth: Payer: Self-pay | Admitting: Interventional Cardiology

## 2021-04-10 NOTE — Telephone Encounter (Signed)
Called patient back about her message. Discussed patient's BP readings and then patient started talking about issues with her Plavix. Patient stated the day she switched from Brilinta to Plavix that she had a heart attack on 02/13/21. Patient stated she felt like she did before she had her heart cath in 2021. Patient stated the episode last 15 minutes and she took 2 nitroglycerin 5 minutes apart and took 2 aspirin during this time. Patient stated now in the mornings she has chest pressure and weakness in her arms, and pain in her back. Patient stated she gets weaker when she walks. Patient also complained of purple spots under her skin on her hands, arms, and legs after being on Plavix instead of Brilinta. Patient would like to switch back to Brilinta. Informed patient that since she is having issues with chest pressure and has history of CAD, made patient an appointment with DOD tomorrow to be evaluated. Will forward to Dr. Irish Lack for further advisement.

## 2021-04-10 NOTE — Telephone Encounter (Signed)
Her residual disease in the RCA looked significant which is why I wanted something stronger than aspirin.  You could try the low dose Brilinta as well given her bleeding risks.

## 2021-04-10 NOTE — Telephone Encounter (Signed)
Pt c/o BP issue: STAT if pt c/o blurred vision, one-sided weakness or slurred speech  1. What are your last 5 BP readings?  04/10/21:  96/40 74       104/70 74       136/58 72  2. Are you having any other symptoms (ex. Dizziness, headache, blurred vision, passed out)?  Lack of energy  3. What is your BP issue?   Patient's BP has been low-fluctuating. She states she also lacks energy and feels sluggish.

## 2021-04-10 NOTE — Telephone Encounter (Signed)
The pt called and wanted to know if she should be concerned with her bp being 96/40 and pulse of 70.  The pt was told that Laurance Flatten, DNp, FNP-BC said yes the pt needed to notify her cardiologist incase her meds needed to be changed.  The pt said she felt ok when asked how she was doing.

## 2021-04-11 ENCOUNTER — Encounter (INDEPENDENT_AMBULATORY_CARE_PROVIDER_SITE_OTHER): Payer: Self-pay

## 2021-04-11 ENCOUNTER — Ambulatory Visit: Payer: PPO | Admitting: Internal Medicine

## 2021-04-11 ENCOUNTER — Other Ambulatory Visit: Payer: Self-pay

## 2021-04-11 ENCOUNTER — Encounter: Payer: Self-pay | Admitting: Internal Medicine

## 2021-04-11 VITALS — BP 110/59 | HR 76 | Ht 64.0 in | Wt 165.0 lb

## 2021-04-11 DIAGNOSIS — I10 Essential (primary) hypertension: Secondary | ICD-10-CM | POA: Diagnosis not present

## 2021-04-11 DIAGNOSIS — I25118 Atherosclerotic heart disease of native coronary artery with other forms of angina pectoris: Secondary | ICD-10-CM | POA: Diagnosis not present

## 2021-04-11 DIAGNOSIS — I2510 Atherosclerotic heart disease of native coronary artery without angina pectoris: Secondary | ICD-10-CM | POA: Insufficient documentation

## 2021-04-11 DIAGNOSIS — I4510 Unspecified right bundle-branch block: Secondary | ICD-10-CM

## 2021-04-11 DIAGNOSIS — E785 Hyperlipidemia, unspecified: Secondary | ICD-10-CM

## 2021-04-11 DIAGNOSIS — R011 Cardiac murmur, unspecified: Secondary | ICD-10-CM

## 2021-04-11 MED ORDER — TICAGRELOR 90 MG PO TABS: ORAL_TABLET | ORAL | 3 refills | Status: DC

## 2021-04-11 NOTE — Patient Instructions (Addendum)
Medication Instructions:  Your physician has recommended you make the following change in your medication:  STOP: clopidogrel (Plavix)  START:  ticagrelor (Brilinta) 180 mg 1st dose (2 pills) by mouth once      THEN  Ticagrelor (Brilinta) 90 mg by mouth twice daily  *If you need a refill on your cardiac medications before your next appointment, please call your pharmacy*   Lab Work: TODAY: CBC If you have labs (blood work) drawn today and your tests are completely normal, you will receive your results only by: [place] (if you have MyChart) OR A paper copy in the mail If you have any lab test that is abnormal or we need to change your treatment, we will call you to review the results.   Testing/Procedures: Your physician has requested that you have an echocardiogram. Echocardiography is a painless test that uses sound waves to create images of your heart. It provides your doctor with information about the size and shape of your heart and how well your heart's chambers and valves are working. This procedure takes approximately one hour. There are no restrictions for this procedure.    Follow-Up: At [facility], you and your health needs are our priority.  As part of our continuing mission to provide you with exceptional heart care, we have created designated Provider Care Teams.  These Care Teams include your primary Cardiologist (physician) and Advanced Practice Providers (APPs -  Physician Assistants and Nurse Practitioners) who all work together to provide you with the care you need, when you need it.  We recommend signing up for the patient portal called "MyChart".  Sign up information is provided on this After Visit Summary.  MyChart is used to connect with patients for Virtual Visits (Telemedicine).  Patients are able to view lab/test results, encounter notes, upcoming appointments, etc.  Non-urgent messages can be sent to your provider as well.   To learn more about what you  can do with MyChart, go to [email or web address].    Your next appointment:   3-4 month(s)  The format for your next appointment:   In Person  Provider:   You may see Larae Grooms, MD or one of the following Advanced Practice Providers on your designated Care Team:   Melina Copa, PA-C Ermalinda Barrios, PA-C

## 2021-04-11 NOTE — Progress Notes (Signed)
Cardiology Office Note:    Date:  04/11/2021   ID:  [name], DOB 1936-05-10, MRN [id number]  PCP:  [name], FNP   [facility] Providers Cardiologist:  Larae Grooms, MD     Referring MD: [name], FNP   CC: DOD for Return of chest pressure  History of Present Illness:    Abigail [name] is a 85 y.o. female with a hx CAD, CKD Stage IIIa, Obstructive CAD (RCA) with moderate non-obstructive LAD and Lcx with PCI to prox RCA.  Prior LHC was deferred in the setting significant anemia.  Patient seen in 6/22 with transition to plavix. Seen 04/11/21.  Patient notes that she is doing worse on plavix.  Notes that the date that she changed she has chest pain and felt like she was having a heart attack.  Took two nitroglycerin and two ASA and resolve it.  Since then notes that   Original anginal equivalent was chest pressure that didn't go ok.  Notes that her blood pressures has been low and was told to call cardiology about this. Brought up this event.  No more bleeding post her surgery.  Patient notes some chest pressure in the morning when she wakes up that improves with waking but this is a different chest pressure that her angina..  No SOB at rest but feels short winded with activity and no PND/Orthopnea.  No weight gain or leg swelling.  No palpitations or syncope . Feels like she doesn't have the stamina that she needs.  Ambulatory blood pressure not done.   Past Medical History:  Diagnosis Date   Anemia    Arthritis    knees, back   Carotid artery occlusion    Chronic kidney disease    stage 3 ckd no nephrologist   Complete uterine prolapse with prolapse of anterior vaginal wall    Complication of anesthesia    hard to wake up per pt   Constipation    Coronary artery disease    Diverticulitis yrs ago coialitis   History of blood transfusion    Hypertension    Hypothyroid    Numbness    in hands at times   Pre-diabetes    Scoliosis    STEMI (ST elevation  myocardial infarction) ([place]) 10/26/2019   DES RCA   Wears dentures    full dentures   Wears glasses    for reading    Past Surgical History:  Procedure Laterality Date   ANTERIOR AND POSTERIOR REPAIR WITH SACROSPINOUS FIXATION N/A 12/20/2020   Procedure: SACROSPINOUS LIGAMENT FIXATION;  Surgeon: [name], MD;  Location: [facility];  Service: Gynecology;  Laterality: N/A;  Total time requested for all procedures is 2 hours   [place] age 89   lower   bartholin cyst removal  age 85's   BLADDER SUSPENSION N/A 12/20/2020   Procedure: TRANSVAGINAL TAPE (TVT) PROCEDURE;  Surgeon: [name], MD;  Location: [facility];  Service: Gynecology;  Laterality: N/A;   CORONARY/GRAFT ACUTE MI REVASCULARIZATION N/A 10/26/2019   Procedure: Coronary/Graft Acute MI Revascularization;  Surgeon: Sherren Mocha, MD;  Location: [place] [facility];  Service: Cardiovascular;  Laterality: N/A;   CYSTOCELE REPAIR N/A 12/20/2020   Procedure: ANTERIOR AND POSTERIOR REPAIR WITH PERINEORRHAPHY;  Surgeon: [name], MD;  Location: [facility];  Service: Gynecology;  Laterality: N/A;   CYSTOSCOPY N/A 12/20/2020   Procedure: CYSTOSCOPY;  Surgeon: [name], MD;  Location: [place];  Service: Gynecology;  Laterality: N/A;   ELBOW SURGERY  1990's   left   LEFT HEART CATH AND CORONARY ANGIOGRAPHY N/A 10/26/2019   Procedure: LEFT HEART CATH AND CORONARY ANGIOGRAPHY;  Surgeon: Sherren Mocha, MD;  Location: [place] [facility];  Service: Cardiovascular;  Laterality: N/A;    Current Medications: Current Meds  Medication Sig   acetaminophen (TYLENOL) 325 MG tablet Take 2 tablets (650 mg total) by mouth every 6 (six) hours as needed for mild pain (or Fever >/= 101).   aspirin EC 81 MG tablet Take 81 mg by mouth daily.   atorvastatin (LIPITOR) 80 MG tablet Take 1 tablet (80 mg total) by mouth daily at 6 PM.    Cranberry 1000 MG CAPS Take 1,000 mg by mouth daily at 12 noon.   cyclobenzaprine (FLEXERIL) 10 MG tablet Take 1 tablet (10 mg total) by mouth 3 (three) times daily as needed for muscle spasms.   docusate sodium (COLACE) 100 MG capsule Take 100-200 mg by mouth See admin instructions. Take 100 mg in the morning and 200 mg at bedtime   estradiol (ESTRACE) 0.1 MG/GM vaginal cream Place 0.5 g vaginally 2 (two) times a week. Place 0.5g twice a week at opening of vagina (Patient taking differently: Place 0.5 g vaginally daily. Place 0.5g twice a week at opening of vagina)   ferrous sulfate 300 (60 Fe) MG/5ML syrup Take 5 mLs (300 mg total) by mouth 2 (two) times daily with a meal.   levothyroxine (SYNTHROID) 175 MCG tablet TAKE 1 TABLET BY MOUTH EVERY MORNING ON MONDAY - FRIDAY   lisinopril-hydrochlorothiazide (ZESTORETIC) 20-25 MG tablet Take 0.5 tablets by mouth 3 (three) times a week. Take on Monday Wednesday and Friday only   nitroGLYCERIN (NITROSTAT) 0.4 MG SL tablet Place 1 tablet (0.4 mg total) under the tongue every 5 (five) minutes x 3 doses as needed for chest pain.   sulfamethoxazole-trimethoprim (BACTRIM) 400-80 MG tablet Take 0.5 tablets by mouth daily.   ticagrelor (BRILINTA) 90 MG TABS tablet First dose take 180 mg (2 tablets) then take 90 mg daily   Trospium Chloride 60 MG CP24 Take 1 capsule (60 mg total) by mouth daily.   [DISCONTINUED] clopidogrel (PLAVIX) 75 MG tablet Take 1 tablet (75 mg total) by mouth daily.     Allergies:   Codeine and Norvasc [amlodipine]   Social History   Socioeconomic History   Marital status: Widowed    Spouse name: Not on file   Number of children: Not on file   Years of education: Not on file   Highest education level: Not on file  Occupational History   Occupation: semi retired  Tobacco Use   Smoking status: Former    Packs/day: 1.50    Years: 35.00    Pack years: 52.50    Types: Cigarettes    Quit date: 04/05/1986    Years since quitting:  35.0   Smokeless tobacco: Never  Vaping Use   Vaping Use: Never used  Substance and Sexual Activity   Alcohol use: No   Drug use: No   Sexual activity: Not Currently    Birth control/protection: Post-menopausal  Other Topics Concern   Not on file  Social History Narrative   Not on file   Social Determinants of Health   Financial Resource Strain: Low Risk    Difficulty of Paying Living Expenses: Not hard at all  Food Insecurity: No Food Insecurity   Worried About Running Out  of Food in the Last Year: Never true   Ault in the Last Year: Never true  Transportation Needs: No Transportation Needs   Lack of Transportation (Medical): No   Lack of Transportation (Non-Medical): No  Physical Activity: Inactive   Days of Exercise per Week: 0 days   Minutes of Exercise per Session: 0 min  Stress: No Stress Concern Present   Feeling of Stress : Not at all  Social Connections: Not on file     Family History: The patient's family history includes Hypertension in her mother.  ROS:   Please see the history of present illness.     All other systems reviewed and are negative.  EKGs/Labs/Other Studies Reviewed:    The following studies were reviewed today:  EKG:  EKG is  ordered today.  The ekg ordered today demonstrates  04/11/21: SR rate 76  new RBBB 11/09/20: SR rate 77 inferior TWI   Transthoracic Echocardiogram: Date: 10/27/2019 Results:  1. Left ventricular ejection fraction, by estimation, is 60 to 65%. The  left ventricle has normal function. The left ventricle has no regional  wall motion abnormalities. Left ventricular diastolic parameters were  normal.   2. Right ventricular systolic function is normal. The right ventricular  size is normal.   3. Left atrial size was mildly dilated.   4. The mitral valve is normal in structure and function. No evidence of  mitral valve regurgitation. No evidence of mitral stenosis.   5. The aortic valve is normal in structure  and function. Aortic valve  regurgitation is not visualized. No aortic stenosis is present.   6. The inferior vena cava is normal in size with greater than 50%  respiratory variability, suggesting right atrial pressure of 3 mmHg.    Left/Right Heart Catheterizations: Date: 10/26/2019 Results: A drug-eluting stent was successfully placed using a SYNERGY XD 2.75X24. Post intervention, there is a 0% residual stenosis.   1.  Severe single-vessel coronary artery disease with a proximal RCA culprit lesion, treated successfully with primary PCI using a 2.75 x 24 mm Synergy DES 2.  Mild diffuse nonobstructive disease involving the LAD and left circumflex without any significant stenoses 3.  Normal LVEDP 4.  Moderately severe diffuse residual stenosis in the mid and distal RCA, favor initial medical therapy.   Recent Labs: 05/16/2020: ALT 19 12/20/2020: BUN 25; Creatinine, Ser 1.30; Potassium 4.8; Sodium 138 01/01/2021: TSH 0.486 02/12/2021: Hemoglobin 10.6; Platelets 276  Recent Lipid Panel    Component Value Date/Time   CHOL 142 05/16/2020 0913   TRIG 122 05/16/2020 0913   HDL 51 05/16/2020 0913   CHOLHDL 2.8 05/16/2020 0913   CHOLHDL 4.8 10/26/2019 2246   VLDL 40 10/26/2019 2246   LDLCALC 69 05/16/2020 0913    Physical Exam:    VS:  BP (!) 110/59   Pulse 76   Ht 5\' 4"  (1.626 m)   Wt 165 lb (74.8 kg)   SpO2 97%   BMI 28.32 kg/m     Wt Readings from Last 3 Encounters:  04/11/21 165 lb (74.8 kg)  02/13/21 164 lb (74.4 kg)  02/12/21 164 lb 9.6 oz (74.7 kg)     GEN:  Well nourished, well developed in no acute distress HEENT: Normal NECK: No JVD LYMPHATICS: No lymphadenopathy CARDIAC: RRR, holosystolic murmur II/VI,  no rubs, gallops RESPIRATORY:  Clear to auscultation without rales, wheezing or rhonchi  ABDOMEN: Soft, non-tender, non-distended MUSCULOSKELETAL:  No edema; No deformity  SKIN: Warm and dry  NEUROLOGIC:  Alert and oriented x 3 PSYCHIATRIC:  Normal affect    ASSESSMENT:    1. Coronary artery disease involving native coronary artery of native heart with other form of angina pectoris ([place])   2. Right bundle branch block   3. Essential hypertension   4. Hyperlipidemia, unspecified hyperlipidemia type   5. Newly recognized heart murmur    PLAN:    CAD with worsening angina New RBBB and heart murmur Anemia HTN and HLD - patient describes only one episode of angina but has new ECG changes and heart murmur; will start with echocardiogram - continue ASA and will transition plavix to brilinta - low threshold to start Imdur if future PRN ntitro use; in that regard patient will restart ambulatory BP monitoring (low BP at home) - given prior anemia; will repeat CBC (resolved post OB/GYN surgery)  Three months with Dr. Danae Chen unless new issues  Time Spent Directly with Patient:   I have spent a total of 40 minutes with the patient reviewing notes, imaging, EKGs, labs and examining the patient as well as establishing an assessment and plan that was discussed personally with the patient.  > 50% of time was spent in direct patient care.    Medication Adjustments/Labs and Tests Ordered: Current medicines are reviewed at length with the patient today.  Concerns regarding medicines are outlined above.  Orders Placed This Encounter  Procedures   CBC   EKG 12-Lead   ECHOCARDIOGRAM COMPLETE    Meds ordered this encounter  Medications   ticagrelor (BRILINTA) 90 MG TABS tablet    Sig: First dose take 180 mg (2 tablets) then take 90 mg daily    Dispense:  180 tablet    Refill:  3       Signed, Werner Lean, MD  04/11/2021 11:13 AM    [place]

## 2021-04-12 LAB — CBC
Hematocrit: 27 % — ABNORMAL LOW (ref 34.0–46.6)
Hemoglobin: 8.4 g/dL — ABNORMAL LOW (ref 11.1–15.9)
MCH: 30 pg (ref 26.6–33.0)
MCHC: 31.1 g/dL — ABNORMAL LOW (ref 31.5–35.7)
MCV: 96 fL (ref 79–97)
Platelets: 259 10*3/uL (ref 150–450)
RBC: 2.8 x10E6/uL — ABNORMAL LOW (ref 3.77–5.28)
RDW: 13.8 % (ref 11.7–15.4)
WBC: 7.7 10*3/uL (ref 3.4–10.8)

## 2021-04-15 ENCOUNTER — Emergency Department (HOSPITAL_COMMUNITY): Payer: PPO

## 2021-04-15 ENCOUNTER — Telehealth: Payer: Self-pay | Admitting: Interventional Cardiology

## 2021-04-15 ENCOUNTER — Emergency Department (HOSPITAL_COMMUNITY)
Admission: EM | Admit: 2021-04-15 | Discharge: 2021-04-15 | Disposition: A | Discharge status: Home / Self Care | Payer: PPO | Attending: Emergency Medicine | Admitting: Emergency Medicine

## 2021-04-15 ENCOUNTER — Other Ambulatory Visit: Payer: Self-pay

## 2021-04-15 DIAGNOSIS — N183 Chronic kidney disease, stage 3 unspecified: Secondary | ICD-10-CM | POA: Insufficient documentation

## 2021-04-15 DIAGNOSIS — Z7982 Long term (current) use of aspirin: Secondary | ICD-10-CM | POA: Diagnosis not present

## 2021-04-15 DIAGNOSIS — I10 Essential (primary) hypertension: Secondary | ICD-10-CM | POA: Diagnosis not present

## 2021-04-15 DIAGNOSIS — I129 Hypertensive chronic kidney disease with stage 1 through stage 4 chronic kidney disease, or unspecified chronic kidney disease: Secondary | ICD-10-CM | POA: Diagnosis not present

## 2021-04-15 DIAGNOSIS — Z79899 Other long term (current) drug therapy: Secondary | ICD-10-CM | POA: Diagnosis not present

## 2021-04-15 DIAGNOSIS — I2510 Atherosclerotic heart disease of native coronary artery without angina pectoris: Secondary | ICD-10-CM | POA: Diagnosis not present

## 2021-04-15 DIAGNOSIS — R079 Chest pain, unspecified: Secondary | ICD-10-CM | POA: Insufficient documentation

## 2021-04-15 DIAGNOSIS — R531 Weakness: Secondary | ICD-10-CM | POA: Insufficient documentation

## 2021-04-15 DIAGNOSIS — R911 Solitary pulmonary nodule: Secondary | ICD-10-CM | POA: Diagnosis not present

## 2021-04-15 DIAGNOSIS — R42 Dizziness and giddiness: Secondary | ICD-10-CM | POA: Diagnosis not present

## 2021-04-15 DIAGNOSIS — E039 Hypothyroidism, unspecified: Secondary | ICD-10-CM | POA: Insufficient documentation

## 2021-04-15 DIAGNOSIS — I700 Atherosclerosis of aorta: Secondary | ICD-10-CM | POA: Diagnosis not present

## 2021-04-15 DIAGNOSIS — K449 Diaphragmatic hernia without obstruction or gangrene: Secondary | ICD-10-CM | POA: Diagnosis not present

## 2021-04-15 DIAGNOSIS — Z87891 Personal history of nicotine dependence: Secondary | ICD-10-CM | POA: Insufficient documentation

## 2021-04-15 DIAGNOSIS — R918 Other nonspecific abnormal finding of lung field: Secondary | ICD-10-CM | POA: Diagnosis not present

## 2021-04-15 DIAGNOSIS — I517 Cardiomegaly: Secondary | ICD-10-CM | POA: Diagnosis not present

## 2021-04-15 DIAGNOSIS — D649 Anemia, unspecified: Secondary | ICD-10-CM | POA: Diagnosis not present

## 2021-04-15 LAB — COMPREHENSIVE METABOLIC PANEL
ALT: 22 U/L (ref 0–44)
AST: 25 U/L (ref 15–41)
Albumin: 4 g/dL (ref 3.5–5.0)
Alkaline Phosphatase: 90 U/L (ref 38–126)
Anion gap: 9 (ref 5–15)
BUN: 33 mg/dL — ABNORMAL HIGH (ref 8–23)
CO2: 25 mmol/L (ref 22–32)
Calcium: 8.9 mg/dL (ref 8.9–10.3)
Chloride: 103 mmol/L (ref 98–111)
Creatinine, Ser: 1.27 mg/dL — ABNORMAL HIGH (ref 0.44–1.00)
GFR, Estimated: 42 mL/min — ABNORMAL LOW (ref >60)
Glucose, Bld: 112 mg/dL — ABNORMAL HIGH (ref 70–99)
Potassium: 4.8 mmol/L (ref 3.5–5.1)
Sodium: 137 mmol/L (ref 135–145)
Total Bilirubin: 0.3 mg/dL (ref 0.3–1.2)
Total Protein: 7.8 g/dL (ref 6.5–8.1)

## 2021-04-15 LAB — CBC WITH DIFFERENTIAL/PLATELET
Abs Immature Granulocytes: 0.02 10*3/uL (ref 0.00–0.07)
Basophils Absolute: 0 10*3/uL (ref 0.0–0.1)
Basophils Relative: 0 %
Eosinophils Absolute: 0.2 10*3/uL (ref 0.0–0.5)
Eosinophils Relative: 3 %
HCT: 30.1 % — ABNORMAL LOW (ref 36.0–46.0)
Hemoglobin: 9.3 g/dL — ABNORMAL LOW (ref 12.0–15.0)
Immature Granulocytes: 0 %
Lymphocytes Relative: 11 %
Lymphs Abs: 0.8 10*3/uL (ref 0.7–4.0)
MCH: 31.4 pg (ref 26.0–34.0)
MCHC: 30.9 g/dL (ref 30.0–36.0)
MCV: 101.7 fL — ABNORMAL HIGH (ref 80.0–100.0)
Monocytes Absolute: 0.8 10*3/uL (ref 0.1–1.0)
Monocytes Relative: 11 %
Neutro Abs: 5.7 10*3/uL (ref 1.7–7.7)
Neutrophils Relative %: 75 %
Platelets: 325 10*3/uL (ref 150–400)
RBC: 2.96 MIL/uL — ABNORMAL LOW (ref 3.87–5.11)
RDW: 15.2 % (ref 11.5–15.5)
WBC: 7.6 10*3/uL (ref 4.0–10.5)
nRBC: 0 % (ref 0.0–0.2)

## 2021-04-15 LAB — PROTIME-INR
INR: 1 (ref 0.8–1.2)
Prothrombin Time: 13.2 seconds (ref 11.4–15.2)

## 2021-04-15 LAB — TROPONIN I (HIGH SENSITIVITY)
Troponin I (High Sensitivity): 6 ng/L (ref <18)
Troponin I (High Sensitivity): 7 ng/L (ref <18)

## 2021-04-15 LAB — TYPE AND SCREEN
ABO/RH(D): B POS
Antibody Screen: NEGATIVE

## 2021-04-15 MED ORDER — HYDRALAZINE HCL 25 MG PO TABS
50.0000 mg | ORAL_TABLET | Freq: Once | ORAL | Status: AC
  Administered 2021-04-15: 50 mg via ORAL
  Filled 2021-04-15: qty 2

## 2021-04-15 MED ORDER — IOHEXOL 350 MG/ML SOLN
60.0000 mL | Freq: Once | INTRAVENOUS | Status: AC | PRN
  Administered 2021-04-15: 60 mL via INTRAVENOUS

## 2021-04-15 NOTE — ED Notes (Signed)
Negative occult blood reading

## 2021-04-15 NOTE — ED Notes (Signed)
With CT

## 2021-04-15 NOTE — Telephone Encounter (Signed)
Called pt back in regards to EMS call in.  Pt currently in ambulance on the way to [facility]  I advised her that ED will take over care and cardiology will f/u inpatient as needed.

## 2021-04-15 NOTE — Progress Notes (Signed)
Noted  

## 2021-04-15 NOTE — ED Provider Notes (Addendum)
[facility] DEPT Provider Note   CSN: [id number] Arrival date & time: 04/15/21  1317     History Chief Complaint  Patient presents with   Weakness    Abigail [name] is a 85 y.o. female with past medical history of CAD, CKD stage III, obstructive CAD, anemia that presents emergency from today for weakness. Patient presenting today for weakness that began this morning and lasted an hour.  Patient was put on Plavix on 6/22 after switching from [place].  Patient had PCP appointment 4 days ago, at that time her hemoglobin was 8.4.   Appears as if patient's hemoglobin ranges around 8-10.6.  Was 10.6 2 months ago.  PCP took her off Plavix yesterday after hemoglobin resulted at 8.4. Patient denies any rectal bleeding, however states that her stools have been darker since she started taking her iron supplement.  Patient states that she felt fine yesterday, had sudden onset of weakness this morning, states that her whole body felt weak, no focal weakness.  States that this ate some breakfast, at that time took her blood pressure states that it was 99/40.  States that her blood pressure has been running low recently, did see her cardiologist about this.  Patient states that she did take her BP medicine this morning. Currently denies any weakness, states that she feels much better than when she came in.  At the time of weakness, denied any chest pain, shortness of breath, dizziness, any other symptoms besides weakness.  Currently denying any symptoms currently.  Denies any nausea, vomiting, diarrhea, abdominal pain.  HPI     Past Medical History:  Diagnosis Date   Anemia    Arthritis    knees, back   Carotid artery occlusion    Chronic kidney disease    stage 3 ckd no nephrologist   Complete uterine prolapse with prolapse of anterior vaginal wall    Complication of anesthesia    hard to wake up per pt   Constipation    Coronary artery disease     Diverticulitis yrs ago coialitis   History of blood transfusion    Hypertension    Hypothyroid    Numbness    in hands at times   Pre-diabetes    Scoliosis    STEMI (ST elevation myocardial infarction) ([place]) 10/26/2019   DES RCA   Wears dentures    full dentures   Wears glasses    for reading    Patient Active Problem List   Diagnosis Date Noted   Coronary artery disease 04/11/2021   Right bundle branch block 04/11/2021   Newly recognized heart murmur 04/11/2021   Occult blood positive stool 11/09/2020   Chest pain with moderate risk for cardiac etiology 12/01/2019   Urinary urgency 12/01/2019   Anxiety 02/09/2019   Prediabetes 12/29/2018   Essential hypertension 08/27/2018   Vitamin D deficiency 06/25/2018   Hyperlipidemia 06/25/2018   Hypothyroidism 06/25/2018   Benign hypertension with CKD (chronic kidney disease) stage III ([place]) 06/25/2018   Chronic kidney disease, stage III (moderate) ([place]) 06/25/2018   OAB (overactive bladder) 08/26/2016   CAP (community acquired pneumonia) 04/12/2012   Bacteremia 04/08/2012   LLQ abdominal pain 04/05/2012   Hyponatremia 04/05/2012    Past Surgical History:  Procedure Laterality Date   ANTERIOR AND POSTERIOR REPAIR WITH SACROSPINOUS FIXATION N/A 12/20/2020   Procedure: SACROSPINOUS LIGAMENT FIXATION;  Surgeon: [name], MD;  Location: [facility];  Service: Gynecology;  Laterality: N/A;  Total time requested for all procedures is 2 hours   [place] age 89   lower   bartholin cyst removal  age 79's   BLADDER SUSPENSION N/A 12/20/2020   Procedure: TRANSVAGINAL TAPE (TVT) PROCEDURE;  Surgeon: [name], MD;  Location: [facility];  Service: Gynecology;  Laterality: N/A;   CORONARY/GRAFT ACUTE MI REVASCULARIZATION N/A 10/26/2019   Procedure: Coronary/Graft Acute MI Revascularization;  Surgeon: Sherren Mocha, MD;  Location: [facility];  Service: Cardiovascular;   Laterality: N/A;   CYSTOCELE REPAIR N/A 12/20/2020   Procedure: ANTERIOR AND POSTERIOR REPAIR WITH PERINEORRHAPHY;  Surgeon: [name], MD;  Location: [facility];  Service: Gynecology;  Laterality: N/A;   CYSTOSCOPY N/A 12/20/2020   Procedure: CYSTOSCOPY;  Surgeon: [name], MD;  Location: [facility];  Service: Gynecology;  Laterality: N/A;   ELBOW SURGERY  1990's   left   LEFT HEART CATH AND CORONARY ANGIOGRAPHY N/A 10/26/2019   Procedure: LEFT HEART CATH AND CORONARY ANGIOGRAPHY;  Surgeon: Sherren Mocha, MD;  Location: [facility];  Service: Cardiovascular;  Laterality: N/A;     OB History     Gravida  2   Para  2   Term  2   Preterm      AB      Living  2      SAB      IAB      Ectopic      Multiple      Live Births              Family History  Problem Relation Age of Onset   Hypertension Mother     Social History   Tobacco Use   Smoking status: Former    Packs/day: 1.50    Years: 35.00    Pack years: 52.50    Types: Cigarettes    Quit date: 04/05/1986    Years since quitting: 35.0   Smokeless tobacco: Never  Vaping Use   Vaping Use: Never used  Substance Use Topics   Alcohol use: No   Drug use: No    Home Medications Prior to Admission medications   Medication Sig Start Date End Date Taking? Authorizing Provider  acetaminophen (TYLENOL) 325 MG tablet Take 2 tablets (650 mg total) by mouth every 6 (six) hours as needed for mild pain (or Fever >/= 101). 11/10/20   Nita Sells, MD  aspirin EC 81 MG tablet Take 81 mg by mouth daily.    [provider]  atorvastatin (LIPITOR) 80 MG tablet Take 1 tablet (80 mg total) by mouth daily at 6 PM. 09/18/20   Jettie Booze, MD  Cranberry 1000 MG CAPS Take 1,000 mg by mouth daily at 12 noon.    [provider]  cyclobenzaprine (FLEXERIL) 10 MG tablet Take 1 tablet (10 mg total) by mouth 3 (three) times daily as  needed for muscle spasms. 12/11/20   [name], MD  docusate sodium (COLACE) 100 MG capsule Take 100-200 mg by mouth See admin instructions. Take 100 mg in the morning and 200 mg at bedtime    [provider]  estradiol (ESTRACE) 0.1 MG/GM vaginal cream Place 0.5 g vaginally 2 (two) times a week. Place 0.5g twice a week at opening of vagina Patient taking differently: Place 0.5 g vaginally daily. Place 0.5g twice a week at opening of vagina 10/29/20   [name], MD  ferrous  sulfate 300 (60 Fe) MG/5ML syrup Take 5 mLs (300 mg total) by mouth 2 (two) times daily with a meal. 02/14/21   Jettie Booze, MD  levothyroxine (SYNTHROID) 175 MCG tablet TAKE 1 TABLET BY MOUTH EVERY MORNING ON MONDAY - FRIDAY 04/03/21   [name], FNP  lisinopril-hydrochlorothiazide (ZESTORETIC) 20-25 MG tablet Take 0.5 tablets by mouth 3 (three) times a week. Take on Monday Wednesday and Friday only 12/02/19   Barrett, Evelene Croon, PA-C  nitroGLYCERIN (NITROSTAT) 0.4 MG SL tablet Place 1 tablet (0.4 mg total) under the tongue every 5 (five) minutes x 3 doses as needed for chest pain. 10/28/19   Duke, Tami Lin, PA  sulfamethoxazole-trimethoprim (BACTRIM) 400-80 MG tablet Take 0.5 tablets by mouth daily. 03/11/21   [name], MD  ticagrelor (BRILINTA) 90 MG TABS tablet Take 180 mg (2 pills) for the 1st dose then take 90 mg twice daily 04/11/21   Rudean Haskell A, MD  Trospium Chloride 60 MG CP24 Take 1 capsule (60 mg total) by mouth daily. 10/26/20   [name], MD    Allergies    Codeine and Norvasc [amlodipine]  Review of Systems   Review of Systems  Constitutional:  Negative for chills, diaphoresis, fatigue and fever.  HENT:  Negative for congestion, sore throat and trouble swallowing.   Eyes:  Negative for pain and visual disturbance.  Respiratory:  Negative for cough, shortness of breath and wheezing.   Cardiovascular:  Negative for chest pain, palpitations  and leg swelling.  Gastrointestinal:  Negative for abdominal distention, abdominal pain, diarrhea, nausea and vomiting.  Genitourinary:  Negative for difficulty urinating.  Musculoskeletal:  Negative for back pain, neck pain and neck stiffness.  Skin:  Negative for pallor.  Neurological:  Positive for weakness. Negative for dizziness, speech difficulty and headaches.  Psychiatric/Behavioral:  Negative for confusion.    Physical Exam Updated Vital Signs BP (!) 195/75   Pulse 63   Temp 98.4 F (36.9 C) (Oral)   Resp 19   Ht 5\' 5"  (1.651 m)   Wt 74.8 kg   SpO2 95%   BMI 27.46 kg/m   Physical Exam Constitutional:      General: She is not in acute distress.    Appearance: Normal appearance. She is not ill-appearing, toxic-appearing or diaphoretic.  HENT:     Mouth/Throat:     Mouth: Mucous membranes are moist.     Pharynx: Oropharynx is clear.  Eyes:     General: No scleral icterus.    Extraocular Movements: Extraocular movements intact.     Pupils: Pupils are equal, round, and reactive to light.  Cardiovascular:     Rate and Rhythm: Normal rate and regular rhythm.     Pulses: Normal pulses.     Heart sounds: Normal heart sounds.  Pulmonary:     Effort: Pulmonary effort is normal. No respiratory distress.     Breath sounds: Normal breath sounds. No stridor. No wheezing, rhonchi or rales.  Chest:     Chest wall: No tenderness.  Abdominal:     General: Abdomen is flat. There is no distension.     Palpations: Abdomen is soft.     Tenderness: There is no abdominal tenderness. There is no guarding or rebound.  Genitourinary:    Comments: Chaperone present. Digital Rectal exam reveals sphincter with good tone. No external hemorrhoids, masses, or fissures. Stool color is black with no overt blood. Pt is on iron.   Musculoskeletal:  General: No swelling or tenderness. Normal range of motion.     Cervical back: Normal range of motion and neck supple. No rigidity.     Right  lower leg: No edema.     Left lower leg: No edema.  Skin:    General: Skin is warm and dry.     Capillary Refill: Capillary refill takes less than 2 seconds.     Coloration: Skin is not pale.  Neurological:     General: No focal deficit present.     Mental Status: She is alert and oriented to person, place, and time.     Comments: Alert. Clear speech. No facial droop. CNIII-XII grossly intact. Bilateral upper and lower extremities' sensation grossly intact. 5/5 symmetric strength with grip strength and with plantar and dorsi flexion bilaterally. Patellar DTRs are 2+ and symmetric . Normal finger to nose bilaterally. Negative pronator drift.   Psychiatric:        Mood and Affect: Mood normal.        Behavior: Behavior normal.    ED Results / Procedures / Treatments   Labs (all labs ordered are listed, but only abnormal results are displayed) Labs Reviewed  COMPREHENSIVE METABOLIC PANEL - Abnormal; Notable for the following components:      Result Value   Glucose, Bld 112 (*)    BUN 33 (*)    Creatinine, Ser 1.27 (*)    GFR, Estimated 42 (*)    All other components within normal limits  CBC WITH DIFFERENTIAL/PLATELET - Abnormal; Notable for the following components:   RBC 2.96 (*)    Hemoglobin 9.3 (*)    HCT 30.1 (*)    MCV 101.7 (*)    All other components within normal limits  POC OCCULT BLOOD, ED - Normal  PROTIME-INR  TYPE AND SCREEN  TROPONIN I (HIGH SENSITIVITY)  TROPONIN I (HIGH SENSITIVITY)    EKG EKG Interpretation  Date/Time:  Monday April 15 2021 13:39:51 EDT Ventricular Rate:  71 PR Interval:  147 QRS Duration: 145 QT Interval:  421 QTC Calculation: 458 R Axis:   -32 Text Interpretation: Sinus rhythm Right bundle branch block Confirmed by Thamas Jaegers ([id number]) on 04/15/2021 4:49:44 PM  Radiology DG Chest 2 View  Result Date: 04/15/2021 CLINICAL DATA:  Weakness, anemia EXAM: CHEST - 2 VIEW COMPARISON:  12/01/2019 chest radiograph. FINDINGS: Stable  cardiomediastinal silhouette with mild cardiomegaly and moderate hiatal hernia. No pneumothorax. No pleural effusion. Posterior mid to upper left lung 3 cm masslike opacity, new. No overt pulmonary edema. IMPRESSION: 1. New 3 cm masslike opacity in the posterior mid to upper left lung. Chest CT with IV contrast recommended for further evaluation. 2. Stable mild cardiomegaly without overt pulmonary edema. 3. Moderate hiatal hernia. Electronically Signed   By: Ilona Sorrel M.D.   On: 04/15/2021 15:59   CT Chest W Contrast  Result Date: 04/15/2021 CLINICAL DATA:  Evaluate pulmonary nodule EXAM: CT CHEST WITH CONTRAST TECHNIQUE: Multidetector CT imaging of the chest was performed during intravenous contrast administration. CONTRAST:  40mL OMNIPAQUE IOHEXOL 350 MG/ML SOLN COMPARISON:  None. FINDINGS: Cardiovascular: Heart size appears within normal limits. Aortic atherosclerosis and coronary artery calcifications. No pericardial effusion. Mediastinum/Nodes: Normal appearance of the thyroid gland. The trachea appears patent and is midline. Normal appearance of the esophagus. Moderate to large hiatal hernia. No axillary, supraclavicular, or mediastinal adenopathy. Enlarged left hilar lymph node measures 1.5 cm, image 62/2. Lungs/Pleura: No pleural effusion. Mass within the superior segment of left lower lobe measures  2.5 x 3.0 cm, image 48/4. No additional pulmonary mass/nodules. Upper Abdomen: No acute abnormality. Several left kidney cysts are identified including mildly complicated cyst arising off the posterior cortex measuring 1.8 cm and 24 Hounsfield units. The adrenal glands appear normal. Musculoskeletal: Scoliosis and multilevel degenerative disc disease identified within the thoracic spine. IMPRESSION: 1. Left lower lobe lung mass is identified with enlarged left hilar lymph node. Findings are concerning for primary bronchogenic carcinoma. Recommend referral to multi disciplinary thoracic oncology for further  management. 2. Aortic atherosclerosis and coronary artery calcifications. 3. Moderate to large hiatal hernia. 4. Left kidney cysts including mildly complicated cyst arising off the posterior cortex of the left kidney. This does not require immediate attention. More definitive characterization with nonemergent CT or MRI without and with contrast material is advised. MRI would be the study of choice, preferably as an outpatient following resolution of any acute clinical condition. Aortic Atherosclerosis (ICD10-I70.0). Electronically Signed   By: Kerby Moors M.D.   On: 04/15/2021 20:35    Procedures Procedures   Medications Ordered in ED Medications  iohexol (OMNIPAQUE) 350 MG/ML injection 60 mL (60 mLs Intravenous Contrast Given 04/15/21 2014)  hydrALAZINE (APRESOLINE) tablet 50 mg (50 mg Oral Given 04/15/21 1944)    ED Course  I have reviewed the triage vital signs and the nursing notes.  Pertinent labs & imaging results that were available during my care of the patient were reviewed by me and considered in my medical decision making (see chart for details).  Clinical Course as of 04/16/21 0033  Mon Apr 15, 2021  2133 Hemoglobin(!): 9.3 [SP]    Clinical Course User Index [SP] Alfredia Client, PA-C   MDM Rules/Calculators/A&P                           EMMAMARIE KLUENDER is a 85 y.o. female with past medical history of CAD, CKD stage III, obstructive CAD, anemia that presents emergency  today for weakness, nonfocal.  This lasted an hour and has completely resolved.  Patient had recent hemoglobin of 8, here today is 9.3.  Digital rectal exam without any occult blood, Hemoccult negative.  Neuro exam here, patient is not hypotensive here.  Patient is ambulatory here.  Work-up today here shows hemoglobin of 9.3, creatinine of 1.27 which appears baseline.  Troponins negative and flat, EKG without any acute changes.  Chest x-ray does show questionable lung mass, did obtain CT imaging which shows  questionable bronchogenic carcinoma.  Did discuss these findings with patient.  Unsure why patient had moment of weakness, could due to orthostatics, patient had just taken blood pressure medication.  Not orthostatic here.  Here was slightly hypertensive, will give hydralazine.  Not complaining of any pain.  Patient states that she feels well, has been feeling fine while she is been here.  Patient did have an episode of 1 minute chest pain, EKG repeated which did not show any changes.  Troponins are negative, pain lasted less than a minute patient states that she feels completely fine now.  CT imaging above.  Upon reevaluation this has not occurred again, patient observed for couple hours after this and this has not occurred again.  Do not think this is cardiac, however patient will follow-up with cardiologist tomorrow.  Patient's been observed for 7 hours now, did message her cardiologist and PCP about findings today.  Upon discharge blood pressure 149/52.  Patient to be discharged, strict return precautions given.  Doubt need for further emergent work up at this time. I explained the diagnosis and have given explicit precautions to return to the ER including for any other new or worsening symptoms. The patient understands and accepts the medical plan as it's been dictated and I have answered their questions. Discharge instructions concerning home care and prescriptions have been given. The patient is STABLE and is discharged to home in good condition.  I discussed this case with my attending physician who cosigned this note including patient's presenting symptoms, physical exam, and planned diagnostics and interventions. Attending physician stated agreement with plan or made changes to plan which were implemented.   Attending physician assessed patient at bedside.  Final Clinical Impression(s) / ED Diagnoses Final diagnoses:  Weakness    Rx / DC Orders ED Discharge Orders     None             Alfredia Client, PA-C 04/16/21 0036    Luna Fuse, MD 04/16/21 [phone]

## 2021-04-15 NOTE — ED Triage Notes (Signed)
Patient brought in via EMS from home. Patient had a hgb of 8 at PCP last Thursday. PCP took her off of plavix. Patient states she just has generalized weakness.

## 2021-04-15 NOTE — Discharge Instructions (Addendum)
  You were evaluated in the Emergency Department and after careful evaluation, we did not find any emergent condition requiring admission or further testing in the hospital.  Please contact your cardiologist as soon as you can in regards to further management in regards to your Plavix.  I also want you to follow-up with your primary care doctor as we discussed, I did attach your CT findings for you to review below.  If you develop any new or worsening concerning symptoms, if you feel like you are to pass out please come back to the emergency department.    Please return to the Emergency Department if you experience any worsening of your condition.  Thank you for allowing [place] to be a part of your care. Please speak to your pharmacist about any new medications prescribed today in regards to side effects or interactions with other medications.    IMPRESSION:  1. Left lower lobe lung mass is identified with enlarged left hilar  lymph node. Findings are concerning for primary bronchogenic  carcinoma. Recommend referral to multi disciplinary thoracic  oncology for further management.  2. Aortic atherosclerosis and coronary artery calcifications.  3. Moderate to large hiatal hernia.  4. Left kidney cysts including mildly complicated cyst arising off  the posterior cortex of the left kidney. This does not require  immediate attention. More definitive characterization with  nonemergent CT or MRI without and with contrast material is advised.  MRI would be the study of choice, preferably as an outpatient  following resolution of any acute clinical condition.

## 2021-04-15 NOTE — ED Notes (Signed)
Patient ambulated to the bathroom with assistance and stated she was having chest pain upon arriving back tot he room. Patient stated pain was 5-6/10 with pressure and put her had over her chest. EKG was captured. Pain lasted for about 1 minute. PA is aware.

## 2021-04-15 NOTE — Telephone Encounter (Signed)
Patient called to say that she called the ambulance because she was having a weak spell all over her body. But also what to see what the dr think  she should do. Explain to her that I wouldn't be able to get dr Irish Lack on the phone right now. Patient decided to go with EMS. Please advise

## 2021-04-16 NOTE — Telephone Encounter (Signed)
Pt aware to hold Plavix for right now and pt has appt with PCP on Thursday  04/18/22 .Adonis Housekeeper

## 2021-04-16 NOTE — Telephone Encounter (Signed)
-----   Message from Jettie Booze, MD sent at 04/15/2021 10:25 PM EDT ----- She also needs f/u with primary to see if there is a source of bleeding.  OK to hold plavix for now.  ----- Message ----- From: Alfredia Client, PA-C Sent: 04/15/2021   8:45 PM EDT To: Jettie Booze, MD, [name], FNP  Hey Dr. Irish Lack,  I just wanted to send a quick message stating that this patient was seen today in the emergency department and she will need follow-up with you in regards to restarting Plavix.  Her hemoglobin here today was 9.3, her CT also did unfortunately show questionable bronchiogenic carcinoma she will need oncology follow-up, I also Ccd primary.  Thank you so much!  Alfredia Client, PA-C

## 2021-04-17 ENCOUNTER — Telehealth: Payer: Self-pay | Admitting: Obstetrics and Gynecology

## 2021-04-18 ENCOUNTER — Ambulatory Visit (INDEPENDENT_AMBULATORY_CARE_PROVIDER_SITE_OTHER): Payer: PPO | Admitting: Nurse Practitioner

## 2021-04-18 ENCOUNTER — Encounter: Payer: Self-pay | Admitting: Nurse Practitioner

## 2021-04-18 ENCOUNTER — Other Ambulatory Visit: Payer: Self-pay

## 2021-04-18 VITALS — BP 124/80 | HR 77 | Temp 97.8°F | Ht 65.0 in | Wt 168.0 lb

## 2021-04-18 DIAGNOSIS — R918 Other nonspecific abnormal finding of lung field: Secondary | ICD-10-CM | POA: Diagnosis not present

## 2021-04-18 DIAGNOSIS — D649 Anemia, unspecified: Secondary | ICD-10-CM | POA: Diagnosis not present

## 2021-04-18 DIAGNOSIS — Z09 Encounter for follow-up examination after completed treatment for conditions other than malignant neoplasm: Secondary | ICD-10-CM

## 2021-04-18 NOTE — Progress Notes (Signed)
I,Katawbba Wiggins,acting as a Education administrator for [employer], FNP.,have documented all relevant documentation on the behalf of [name], FNP,as directed by  [name], FNP while in the presence of [name], [place].  This visit occurred during the SARS-CoV-2 public health emergency.  Safety protocols were in place, including screening questions prior to the visit, additional usage of staff PPE, and extensive cleaning of exam room while observing appropriate contact time as indicated for disinfecting solutions.  Subjective:     Patient ID: Abigail [name] , female    DOB: Dec 31, 1935 , 85 y.o.   MRN: [id number]   Chief Complaint  Patient presents with   Hospitalization Follow-up    HPI  She is here today for follow up ER visit, her Hgb dropped to 8.4.  she had a stool sample which was normal. She is to see the urogynecologist. She has had a blood transfusion but I do not see this in her notes from the ER. She does not want to go to see Dr. Dellis Filbert in reference to her pessary.  Cardiology switched her to plavix from [place] and she reports she had heart palpitations. He will change her back to [place] because she is not doing well with Plavix right now not taking either one .  She had surgery for a pessary and she has continued back pain     Past Medical History:  Diagnosis Date   Anemia    Arthritis    knees, back   Carotid artery occlusion    Chronic kidney disease    stage 3 ckd no nephrologist   Complete uterine prolapse with prolapse of anterior vaginal wall    Complication of anesthesia    hard to wake up per pt   Constipation    Coronary artery disease    Diverticulitis yrs ago coialitis   History of blood transfusion    Hypertension    Hypothyroid    Numbness    in hands at times   Pre-diabetes    Scoliosis    STEMI (ST elevation myocardial infarction) ([place]) 10/26/2019   DES RCA   Wears dentures    full dentures   Wears glasses    for reading     Family History   Problem Relation Age of Onset   Hypertension Mother      Current Outpatient Medications:    acetaminophen (TYLENOL) 325 MG tablet, Take 2 tablets (650 mg total) by mouth every 6 (six) hours as needed for mild pain (or Fever >/= 101). (Patient taking differently: Take 500 mg by mouth every 6 (six) hours as needed for mild pain (or Fever >/= 101). prn), Disp: , Rfl:    aspirin EC 81 MG tablet, Take 81 mg by mouth daily., Disp: , Rfl:    atorvastatin (LIPITOR) 80 MG tablet, Take 1 tablet (80 mg total) by mouth daily at 6 PM., Disp: 90 tablet, Rfl: 3   Cranberry 1000 MG CAPS, Take 1,000 mg by mouth daily at 12 noon., Disp: , Rfl:    docusate sodium (COLACE) 100 MG capsule, Take 100-200 mg by mouth See admin instructions. Take 100 mg in the morning and 200 mg at bedtime, Disp: , Rfl:    estradiol (ESTRACE) 0.1 MG/GM vaginal cream, Place 0.5 g vaginally 2 (two) times a week. Place 0.5g twice a week at opening of vagina (Patient taking differently: Place 0.5 g vaginally. Place 0.5g twice a week at opening of vagina), Disp: 30 g, Rfl: 11   ferrous  sulfate 300 (60 Fe) MG/5ML syrup, Take 5 mLs (300 mg total) by mouth 2 (two) times daily with a meal., Disp: 300 mL, Rfl: 11   levothyroxine (SYNTHROID) 175 MCG tablet, TAKE 1 TABLET BY MOUTH EVERY MORNING ON MONDAY - FRIDAY, Disp: 90 tablet, Rfl: 1   lisinopril-hydrochlorothiazide (ZESTORETIC) 20-25 MG tablet, Take 0.5 tablets by mouth 3 (three) times a week. Take on Monday Wednesday and Friday only, Disp: 90 tablet, Rfl: 1   nitroGLYCERIN (NITROSTAT) 0.4 MG SL tablet, Place 1 tablet (0.4 mg total) under the tongue every 5 (five) minutes x 3 doses as needed for chest pain., Disp: 25 tablet, Rfl: 3   sulfamethoxazole-trimethoprim (BACTRIM) 400-80 MG tablet, Take 0.5 tablets by mouth daily., Disp: 30 tablet, Rfl: 5   cyclobenzaprine (FLEXERIL) 10 MG tablet, Take 1 tablet (10 mg total) by mouth 3 (three) times daily as needed for muscle spasms., Disp: 30 tablet,  Rfl: 1   ticagrelor (BRILINTA) 90 MG TABS tablet, Take 180 mg (2 pills) for the 1st dose then take 90 mg twice daily, Disp: 180 tablet, Rfl: 3   Trospium Chloride 60 MG CP24, TAKE 1 CAPSULE BY MOUTH EVERY DAY, Disp: 30 capsule, Rfl: 5   Allergies  Allergen Reactions   Codeine Nausea And Vomiting   Norvasc [Amlodipine] Rash and Other (See Comments)    rash     Review of Systems  Constitutional: Negative.  Negative for activity change and fatigue.  Eyes:  Negative for visual disturbance.  Respiratory: Negative.  Negative for choking, shortness of breath and wheezing.   Cardiovascular: Negative.  Negative for chest pain, palpitations and leg swelling.  Gastrointestinal: Negative.   Endocrine: Negative.  Negative for polydipsia, polyphagia and polyuria.  Musculoskeletal: Negative.   Skin: Negative.   Neurological:  Negative for dizziness, weakness and headaches.  Psychiatric/Behavioral:  Negative for confusion. The patient is not nervous/anxious.     Today's Vitals   04/18/21 1439  BP: 124/80  Pulse: 77  Temp: 97.8 F (36.6 C)  TempSrc: Oral  Weight: 168 lb (76.2 kg)  Height: 5\' 5"  (1.651 m)  PainSc: 5   PainLoc: Generalized   Body mass index is 27.96 kg/m.  Wt Readings from Last 3 Encounters:  04/23/21 168 lb (76.2 kg)  04/18/21 168 lb (76.2 kg)  04/15/21 165 lb (74.8 kg)    BP Readings from Last 3 Encounters:  04/23/21 (!) 145/70  04/18/21 124/80  04/15/21 (!) 159/50    Objective:  Physical Exam Vitals reviewed.  Constitutional:      General: She is not in acute distress.    Appearance: Normal appearance.  Cardiovascular:     Rate and Rhythm: Normal rate and regular rhythm.     Pulses: Normal pulses.     Heart sounds: Normal heart sounds. No murmur heard. Pulmonary:     Effort: Pulmonary effort is normal. No respiratory distress.     Breath sounds: Normal breath sounds. No wheezing.  Neurological:     General: No focal deficit present.     Mental Status:  She is alert and oriented to person, place, and time.     Cranial Nerves: No cranial nerve deficit.     Motor: No weakness.  Psychiatric:        Mood and Affect: Mood normal.        Behavior: Behavior normal.        Thought Content: Thought content normal.        Judgment: Judgment normal.  Assessment And Plan:     1. Anemia, unspecified type Comments: due to her continued low Hgb will refer to GI will recheck her iron studies. - Ambulatory referral to Gastroenterology - CBC - Iron, TIBC and Ferritin Panel  2. Lung mass Comments: She had an incidental finding of a lung mass, will refer to pulmonology for further evaluation - Ambulatory referral to Pulmonology    Patient was given opportunity to ask questions. Patient verbalized understanding of the plan and was able to repeat key elements of the plan. All questions were answered to their satisfaction.  [name], FNP   I, [name], FNP, have reviewed all documentation for this visit. The documentation on 04/30/21 for the exam, diagnosis, procedures, and orders are all accurate and complete.   IF YOU HAVE BEEN REFERRED TO A SPECIALIST, IT MAY TAKE 1-2 WEEKS TO SCHEDULE/PROCESS THE REFERRAL. IF YOU HAVE NOT HEARD FROM US/SPECIALIST IN TWO WEEKS, PLEASE GIVE [place] A CALL AT [phone].   THE PATIENT IS ENCOURAGED TO PRACTICE SOCIAL DISTANCING DUE TO THE COVID-19 PANDEMIC.

## 2021-04-18 NOTE — Patient Instructions (Signed)
I have placed a referral to the lung specialist since they found a mass in your lungs at the ER.  You may need a washing of your lungs and/or a biopsy to determine what the mass is.

## 2021-04-18 NOTE — Telephone Encounter (Signed)
Abigail [name] is a 85 y.o. female was contacted and scheduled for 04/23/21 at 4pm. Pt advided to arrive at 3:45pm. Pt agreed

## 2021-04-19 LAB — CBC
Hematocrit: 26.6 % — ABNORMAL LOW (ref 34.0–46.6)
Hemoglobin: 8.5 g/dL — ABNORMAL LOW (ref 11.1–15.9)
MCH: 30.5 pg (ref 26.6–33.0)
MCHC: 32 g/dL (ref 31.5–35.7)
MCV: 95 fL (ref 79–97)
Platelets: 345 10*3/uL (ref 150–450)
RBC: 2.79 x10E6/uL — ABNORMAL LOW (ref 3.77–5.28)
RDW: 13.6 % (ref 11.7–15.4)
WBC: 7.1 10*3/uL (ref 3.4–10.8)

## 2021-04-19 LAB — IRON,TIBC AND FERRITIN PANEL
Ferritin: 34 ng/mL (ref 15–150)
Iron Saturation: 11 % — ABNORMAL LOW (ref 15–55)
Iron: 38 ug/dL (ref 27–139)
Total Iron Binding Capacity: 337 ug/dL (ref 250–450)
UIBC: 299 ug/dL (ref 118–369)

## 2021-04-23 ENCOUNTER — Ambulatory Visit (INDEPENDENT_AMBULATORY_CARE_PROVIDER_SITE_OTHER): Payer: PPO | Admitting: Obstetrics and Gynecology

## 2021-04-23 ENCOUNTER — Other Ambulatory Visit: Payer: Self-pay

## 2021-04-23 ENCOUNTER — Encounter: Payer: Self-pay | Admitting: Obstetrics and Gynecology

## 2021-04-23 VITALS — BP 145/70 | HR 82 | Wt 168.0 lb

## 2021-04-23 DIAGNOSIS — N8110 Cystocele, unspecified: Secondary | ICD-10-CM

## 2021-04-23 DIAGNOSIS — R350 Frequency of micturition: Secondary | ICD-10-CM

## 2021-04-23 LAB — POCT URINALYSIS DIPSTICK
Appearance: NORMAL
Bilirubin, UA: NEGATIVE
Glucose, UA: NEGATIVE
Leukocytes, UA: NEGATIVE
Nitrite, UA: NEGATIVE
Protein, UA: NEGATIVE
Spec Grav, UA: 1.02
Urobilinogen, UA: 0.2 U/dL
pH, UA: 5.5

## 2021-04-23 NOTE — Progress Notes (Signed)
[facility] Return Visit  SUBJECTIVE  History of Present Illness: Abigail [name] is a 85 y.o. female seen in follow-up for low hemoglobin.   Surgery: s/p anterior and posterior repair with perineorrhaphy, sacrospinous ligament fixation, midurethral sling and cystoscopy on 12/20/2020 Recently seen by cardiology and Hgb noted to be 8. Pt states she has been feeling weak/ fatigued lately. Had a recent CT scan which showed a Lung mass and she has follow up with GI. She denies any recent vaginal bleeding or return of bulge symptoms.  Past Medical History: Patient  has a past medical history of Anemia, Arthritis, Carotid artery occlusion, Chronic kidney disease, Complete uterine prolapse with prolapse of anterior vaginal wall, Complication of anesthesia, Constipation, Coronary artery disease, Diverticulitis (yrs ago coialitis), History of blood transfusion, Hypertension, Hypothyroid, Numbness, Pre-diabetes, Scoliosis, STEMI (ST elevation myocardial infarction) ([employer]) (10/26/2019), Wears dentures, and Wears glasses.   Past Surgical History: She  has a past surgical history that includes Elbow surgery (1990's); Coronary/Graft Acute MI Revascularization (N/A, 10/26/2019); LEFT HEART CATH AND CORONARY ANGIOGRAPHY (N/A, 10/26/2019); Back surgery (1980 age 5); bartholin cyst removal (age 72's); Anterior and posterior repair with sacrospinous fixation (N/A, 12/20/2020); Cystocele repair (N/A, 12/20/2020); Bladder suspension (N/A, 12/20/2020); and Cystoscopy (N/A, 12/20/2020).   Medications: She has a current medication list which includes the following prescription(s): acetaminophen, aspirin ec, atorvastatin, cranberry, cyclobenzaprine, docusate sodium, estradiol, ferrous sulfate, levothyroxine, lisinopril-hydrochlorothiazide, nitroglycerin, sulfamethoxazole-trimethoprim, ticagrelor, and trospium chloride.   Allergies: Patient is allergic to codeine and norvasc [amlodipine].   Social History: Patient   reports that she quit smoking about 35 years ago. Her smoking use included cigarettes. She has a 52.50 pack-year smoking history. She has never used smokeless tobacco. She reports that she does not drink alcohol and does not use drugs.      OBJECTIVE     Physical Exam: Vitals:   04/23/21 1553  BP: (!) 145/70  Pulse: 82  Weight: 168 lb (76.2 kg)   Gen: No apparent distress, A&O x 3.  Detailed Urogynecologic Evaluation:  Speculum exam reveals normal vaginal mucosa without erosion, normal appearing cervix. Uterus normal size on bimanual exam. Adnexa not palpable.    POP-Q  -1                                            Aa   -1                                           Ba  -6                                              C   3.5                                            Gh  4  Pb  6                                            tvl   -2                                            Ap  -2                                            Bp  -5                                              D   POC urine: trace blood, otherwise negative   ASSESSMENT AND PLAN    Abigail [name] is a 85 y.o. with:  1. Prolapse of anterior vaginal wall   2. Urinary frequency    - While her anterior prolapse has returned somewhat, she does not have any evidence of vaginal or uterine bleeding on exam today. Do not suspect that her anemia is gynecologic cause.  - She is not symptomatic from her prolapse so will manage expectantly.   She will follow up with general GYN for annual exam next year. Otherwise return as needed.   [name], MD  Time spent: I spent 25 minutes dedicated to the care of this patient on the date of this encounter to include pre-visit review of records, face-to-face time with the patient and post visit documentation.

## 2021-04-25 ENCOUNTER — Other Ambulatory Visit: Payer: Self-pay | Admitting: Obstetrics and Gynecology

## 2021-04-25 DIAGNOSIS — N3281 Overactive bladder: Secondary | ICD-10-CM

## 2021-05-01 ENCOUNTER — Other Ambulatory Visit: Payer: Self-pay | Admitting: *Deleted

## 2021-05-01 NOTE — Progress Notes (Signed)
Brilinta removed from med list per result note on labs indicating patient is not taking.

## 2021-05-03 ENCOUNTER — Ambulatory Visit (HOSPITAL_COMMUNITY): Payer: PPO | Attending: Internal Medicine

## 2021-05-03 ENCOUNTER — Other Ambulatory Visit: Payer: Self-pay

## 2021-05-03 DIAGNOSIS — I25118 Atherosclerotic heart disease of native coronary artery with other forms of angina pectoris: Secondary | ICD-10-CM | POA: Diagnosis not present

## 2021-05-03 DIAGNOSIS — I4510 Unspecified right bundle-branch block: Secondary | ICD-10-CM | POA: Diagnosis not present

## 2021-05-03 LAB — ECHOCARDIOGRAM COMPLETE
Area-P 1/2: 1.96 cm2
S' Lateral: 2.8 cm

## 2021-05-06 ENCOUNTER — Telehealth: Payer: Self-pay | Admitting: Internal Medicine

## 2021-05-06 NOTE — Telephone Encounter (Signed)
I have called pt twice and left detailed message ok per DPR.

## 2021-05-06 NOTE — Telephone Encounter (Signed)
[place] is returning [employer] call. Requesting callback after 1:00 PM.

## 2021-05-24 ENCOUNTER — Ambulatory Visit (INDEPENDENT_AMBULATORY_CARE_PROVIDER_SITE_OTHER): Payer: PPO

## 2021-05-24 ENCOUNTER — Telehealth: Payer: PPO

## 2021-05-24 DIAGNOSIS — E785 Hyperlipidemia, unspecified: Secondary | ICD-10-CM | POA: Diagnosis not present

## 2021-05-24 DIAGNOSIS — I10 Essential (primary) hypertension: Secondary | ICD-10-CM | POA: Diagnosis not present

## 2021-05-24 DIAGNOSIS — E039 Hypothyroidism, unspecified: Secondary | ICD-10-CM | POA: Diagnosis not present

## 2021-05-24 DIAGNOSIS — R7303 Prediabetes: Secondary | ICD-10-CM

## 2021-05-24 DIAGNOSIS — F419 Anxiety disorder, unspecified: Secondary | ICD-10-CM

## 2021-05-27 ENCOUNTER — Ambulatory Visit (INDEPENDENT_AMBULATORY_CARE_PROVIDER_SITE_OTHER): Payer: PPO

## 2021-05-27 ENCOUNTER — Telehealth: Payer: PPO

## 2021-05-27 DIAGNOSIS — E785 Hyperlipidemia, unspecified: Secondary | ICD-10-CM

## 2021-05-27 DIAGNOSIS — F419 Anxiety disorder, unspecified: Secondary | ICD-10-CM

## 2021-05-27 DIAGNOSIS — D649 Anemia, unspecified: Secondary | ICD-10-CM

## 2021-05-27 DIAGNOSIS — E039 Hypothyroidism, unspecified: Secondary | ICD-10-CM

## 2021-05-27 DIAGNOSIS — R918 Other nonspecific abnormal finding of lung field: Secondary | ICD-10-CM

## 2021-05-27 DIAGNOSIS — I10 Essential (primary) hypertension: Secondary | ICD-10-CM

## 2021-05-27 DIAGNOSIS — R7303 Prediabetes: Secondary | ICD-10-CM

## 2021-05-27 NOTE — Patient Instructions (Signed)
Visit Information  PATIENT GOALS:  Goals Addressed      Weakness and Fatigue improved and or resolved   On track    Timeframe:  Long-Range Goal Priority:  High Start Date:  03/06/21                           Expected End Date:  09/06/21  Next Scheduled follow up: 05/27/21       Patient Goals: - call [facility] to schedule new patient appointment for in home PT       The patient verbalized understanding of instructions, educational materials, and care plan provided today and declined offer to receive copy of patient instructions, educational materials, and care plan.   Telephone follow up appointment with care management team member scheduled for: 05/27/21  Barb Merino, RN, BSN, CCM Care Management Coordinator [place] [facility]  Direct Phone: [phone]

## 2021-05-27 NOTE — Chronic Care Management (AMB) (Signed)
Chronic Care Management   CCM RN Visit Note  05/24/2021 Name: Abigail [name] MRN: [id number] DOB: 10-Mar-1936  Subjective: Abigail [name] is a 85 y.o. year old female who is a primary care patient of [name], [place]. The care management team was consulted for assistance with disease management and care coordination needs.    Engaged with patient by telephone for follow up visit in response to provider referral for case management and/or care coordination services.   Consent to Services:  The patient was given information about Chronic Care Management services, agreed to services, and gave verbal consent prior to initiation of services.  Please see initial visit note for detailed documentation.   Patient agreed to services and verbal consent obtained.   Assessment: Review of patient past medical history, allergies, medications, health status, including review of consultants reports, laboratory and other test data, was performed as part of comprehensive evaluation and provision of chronic care management services.   SDOH (Social Determinants of Health) assessments and interventions performed:    CCM Care Plan  Allergies  Allergen Reactions   Codeine Nausea And Vomiting   Norvasc [Amlodipine] Rash and Other (See Comments)    rash    Outpatient Encounter Medications as of 05/24/2021  Medication Sig   acetaminophen (TYLENOL) 325 MG tablet Take 2 tablets (650 mg total) by mouth every 6 (six) hours as needed for mild pain (or Fever >/= 101). (Patient taking differently: Take 500 mg by mouth every 6 (six) hours as needed for mild pain (or Fever >/= 101). prn)   aspirin EC 81 MG tablet Take 81 mg by mouth daily.   atorvastatin (LIPITOR) 80 MG tablet Take 1 tablet (80 mg total) by mouth daily at 6 PM.   Cranberry 1000 MG CAPS Take 1,000 mg by mouth daily at 12 noon.   cyclobenzaprine (FLEXERIL) 10 MG tablet Take 1 tablet (10 mg total) by mouth 3 (three) times daily as needed for muscle spasms.    docusate sodium (COLACE) 100 MG capsule Take 100-200 mg by mouth See admin instructions. Take 100 mg in the morning and 200 mg at bedtime   estradiol (ESTRACE) 0.1 MG/GM vaginal cream Place 0.5 g vaginally 2 (two) times a week. Place 0.5g twice a week at opening of vagina (Patient taking differently: Place 0.5 g vaginally. Place 0.5g twice a week at opening of vagina)   ferrous sulfate 300 (60 Fe) MG/5ML syrup Take 5 mLs (300 mg total) by mouth 2 (two) times daily with a meal.   levothyroxine (SYNTHROID) 175 MCG tablet TAKE 1 TABLET BY MOUTH EVERY MORNING ON MONDAY - FRIDAY   lisinopril-hydrochlorothiazide (ZESTORETIC) 20-25 MG tablet Take 0.5 tablets by mouth 3 (three) times a week. Take on Monday Wednesday and Friday only   nitroGLYCERIN (NITROSTAT) 0.4 MG SL tablet Place 1 tablet (0.4 mg total) under the tongue every 5 (five) minutes x 3 doses as needed for chest pain.   sulfamethoxazole-trimethoprim (BACTRIM) 400-80 MG tablet Take 0.5 tablets by mouth daily.   Trospium Chloride 60 MG CP24 TAKE 1 CAPSULE BY MOUTH EVERY DAY   No facility-administered encounter medications on file as of 05/24/2021.    Patient Active Problem List   Diagnosis Date Noted   Coronary artery disease 04/11/2021   Right bundle branch block 04/11/2021   Newly recognized heart murmur 04/11/2021   Occult blood positive stool 11/09/2020   Chest pain with moderate risk for cardiac etiology 12/01/2019   Urinary urgency 12/01/2019   Anxiety 02/09/2019  Prediabetes 12/29/2018   Essential hypertension 08/27/2018   Vitamin D deficiency 06/25/2018   Hyperlipidemia 06/25/2018   Hypothyroidism 06/25/2018   Benign hypertension with CKD (chronic kidney disease) stage III ([place]) 06/25/2018   Chronic kidney disease, stage III (moderate) ([facility]) 06/25/2018   OAB (overactive bladder) 08/26/2016   CAP (community acquired pneumonia) 04/12/2012   Bacteremia 04/08/2012   LLQ abdominal pain 04/05/2012   Hyponatremia 04/05/2012     Conditions to be addressed/monitored: Acquired hypothyroidism, Anxiety, Essential hypertension, Prediabetes, Hyperlipidemia  Care Plan : [facility]  Updates made by Lynne Logan, RN since 05/24/2021 12:00 AM     Problem: Weakness and Fatigue   Priority: High     Long-Range Goal: Weakness and Fatigue improved and or resolved   Start Date: 03/06/2021  Expected End Date: 09/06/2021  Priority: High  Note:   Current Barriers:  Ineffective Self Health Maintenance in a patient with Acquired hypothyroidism, Anxiety, Essential hypertension, Prediabetes, Hyperlipidemia Clinical Goal(s):  Collaboration with [name], FNP regarding development and update of comprehensive plan of care as evidenced by provider attestation and co-signature Inter-disciplinary care team collaboration (see longitudinal plan of care) patient will work with care management team to address care coordination and chronic disease management needs related to Disease Management Educational Needs Care Coordination Medication Management and Education Psychosocial Support   Interventions:  05/24/21 completed inbound call from patient  Evaluation of current treatment plan related to Acquired hypothyroidism, Anxiety, Essential hypertension, Prediabetes, Hyperlipidemia, self-management and patient's adherence to plan as established by provider. Collaboration with [name], FNP regarding development and update of comprehensive plan of care as evidenced by provider attestation       and co-signature Inter-disciplinary care team collaboration (see longitudinal plan of care) Determined patient did not receive call regarding start of PT Reviewed chart and determined Dr. Irish Lack ordered PT for patient on 02/27/21 and per his nurse Mardene Celeste sent orders to [facility]  Provided patient with the contact number listed for [facility] and encouraged her to call when ready to schedule intake visit  Determined  patient continues to receive [place] with good results but would like PT in addition to help with strengthening and endurance Determined patient would like a call back next week to discuss recent referrals for Pulmonary and GI sent by PCP  Discussed plans with patient for ongoing care management follow up and provided patient with direct contact information for care management team Self Care Activities:  Patient verbalizes understanding of plan to schedule in home PT with [facility] administers medications as prescribed Attends all scheduled provider appointments Calls pharmacy for medication refills Calls provider office for new concerns or questions Patient Goals: - contact [facility] to schedule new patient appointment for in home PT   Follow Up Plan: Telephone follow up appointment with care management team member scheduled for: 05/27/21      Plan:Telephone follow up appointment with care management team member scheduled for:  05/27/21  Barb Merino, RN, BSN, CCM Care Management Coordinator [place] [place] [facility]  Direct Phone: [phone]

## 2021-05-28 ENCOUNTER — Ambulatory Visit: Payer: PPO | Admitting: Obstetrics and Gynecology

## 2021-05-29 NOTE — Patient Instructions (Signed)
Visit Information  PATIENT GOALS:  Goals Addressed      Anemia complications prevented or minimized   On track    Timeframe:  Long-Range Goal Priority:  High Start Date: 05/27/21                            Expected End Date: 05/27/22     Follow up date: 07/12/21       Patient Goals: - call [facility] to schedule a new patient appointment                Lung Mass evaluated and treated   On track    Timeframe:  Long-Range Goal Priority:  High Start Date:  05/27/21                           Expected End Date: 05/27/22    Follow up date: 07/12/21  Patient Goals: - call [facility] to schedule a new patient appointment for further evaluation and treatment of lung mass                      The patient verbalized understanding of instructions, educational materials, and care plan provided today and declined offer to receive copy of patient instructions, educational materials, and care plan.   Telephone follow up appointment with care management team member scheduled for: 07/12/21  Barb Merino, RN, BSN, CCM Care Management Coordinator [place] [facility]  Direct Phone: [phone]

## 2021-05-29 NOTE — Chronic Care Management (AMB) (Signed)
Chronic Care Management   CCM RN Visit Note  05/27/2021 Name: Abigail [name] MRN: [id number] DOB: 05-02-1936  Subjective: Abigail [name] is a 85 y.o. year old female who is a primary care patient of [name], [place]. The care management team was consulted for assistance with disease management and care coordination needs.    Engaged with patient by telephone for follow up visit in response to provider referral for case management and/or care coordination services.   Consent to Services:  The patient was given information about Chronic Care Management services, agreed to services, and gave verbal consent prior to initiation of services.  Please see initial visit note for detailed documentation.   Patient agreed to services and verbal consent obtained.   Assessment: Review of patient past medical history, allergies, medications, health status, including review of consultants reports, laboratory and other test data, was performed as part of comprehensive evaluation and provision of chronic care management services.   SDOH (Social Determinants of Health) assessments and interventions performed:  Yes, no acute challenges   CCM Care Plan  Allergies  Allergen Reactions   Codeine Nausea And Vomiting   Norvasc [Amlodipine] Rash and Other (See Comments)    rash    Outpatient Encounter Medications as of 05/27/2021  Medication Sig   acetaminophen (TYLENOL) 325 MG tablet Take 2 tablets (650 mg total) by mouth every 6 (six) hours as needed for mild pain (or Fever >/= 101). (Patient taking differently: Take 500 mg by mouth every 6 (six) hours as needed for mild pain (or Fever >/= 101). prn)   aspirin EC 81 MG tablet Take 81 mg by mouth daily.   atorvastatin (LIPITOR) 80 MG tablet Take 1 tablet (80 mg total) by mouth daily at 6 PM.   Cranberry 1000 MG CAPS Take 1,000 mg by mouth daily at 12 noon.   cyclobenzaprine (FLEXERIL) 10 MG tablet Take 1 tablet (10 mg total) by mouth 3 (three) times daily as  needed for muscle spasms.   docusate sodium (COLACE) 100 MG capsule Take 100-200 mg by mouth See admin instructions. Take 100 mg in the morning and 200 mg at bedtime   estradiol (ESTRACE) 0.1 MG/GM vaginal cream Place 0.5 g vaginally 2 (two) times a week. Place 0.5g twice a week at opening of vagina (Patient taking differently: Place 0.5 g vaginally. Place 0.5g twice a week at opening of vagina)   ferrous sulfate 300 (60 Fe) MG/5ML syrup Take 5 mLs (300 mg total) by mouth 2 (two) times daily with a meal.   levothyroxine (SYNTHROID) 175 MCG tablet TAKE 1 TABLET BY MOUTH EVERY MORNING ON MONDAY - FRIDAY   lisinopril-hydrochlorothiazide (ZESTORETIC) 20-25 MG tablet Take 0.5 tablets by mouth 3 (three) times a week. Take on Monday Wednesday and Friday only   nitroGLYCERIN (NITROSTAT) 0.4 MG SL tablet Place 1 tablet (0.4 mg total) under the tongue every 5 (five) minutes x 3 doses as needed for chest pain.   sulfamethoxazole-trimethoprim (BACTRIM) 400-80 MG tablet Take 0.5 tablets by mouth daily.   Trospium Chloride 60 MG CP24 TAKE 1 CAPSULE BY MOUTH EVERY DAY   No facility-administered encounter medications on file as of 05/27/2021.    Patient Active Problem List   Diagnosis Date Noted   Coronary artery disease 04/11/2021   Right bundle branch block 04/11/2021   Newly recognized heart murmur 04/11/2021   Occult blood positive stool 11/09/2020   Chest pain with moderate risk for cardiac etiology 12/01/2019   Urinary urgency 12/01/2019  Anxiety 02/09/2019   Prediabetes 12/29/2018   Essential hypertension 08/27/2018   Vitamin D deficiency 06/25/2018   Hyperlipidemia 06/25/2018   Hypothyroidism 06/25/2018   Benign hypertension with CKD (chronic kidney disease) stage III ([place]) 06/25/2018   Chronic kidney disease, stage III (moderate) ([place]) 06/25/2018   OAB (overactive bladder) 08/26/2016   CAP (community acquired pneumonia) 04/12/2012   Bacteremia 04/08/2012   LLQ abdominal pain 04/05/2012    Hyponatremia 04/05/2012    Conditions to be addressed/monitored: Acquired hypothyroidism, Anxiety, Essential hypertension, Prediabetes, Hyperlipidemia, Anemia, Lung Mass  Care Plan : Anemia  Updates made by Lynne Logan, RN since 05/27/2021 12:00 AM     Problem: Anemia   Priority: High     Long-Range Goal: Anemia complications prevented or minimized   Start Date: 05/27/2021  Expected End Date: 11/25/2021  This Visit's Progress: On track  Priority: High  Note:   Current Barriers:  Ineffective Self Health Maintenance in a patient with Acquired hypothyroidism, Anxiety, Essential hypertension, Prediabetes, Hyperlipidemia, Anemia, Lung Mass Clinical Goal(s):  Collaboration with [name], FNP regarding development and update of comprehensive plan of care as evidenced by provider attestation and co-signature Inter-disciplinary care team collaboration (see longitudinal plan of care) patient will work with care management team to address care coordination and chronic disease management needs related to Disease Management Educational Needs Care Coordination Medication Management and Education Medication Reconciliation Psychosocial Support   Interventions:  05/27/21 completed successful outbound call with patient  Evaluation of current treatment plan related to  Anemia  , self-management and patient's adherence to plan as established by provider. Collaboration with [name], FNP regarding development and update of comprehensive plan of care as evidenced by provider attestation       and co-signature Inter-disciplinary care team collaboration (see longitudinal plan of care) Review of patient status, including review of consultant's reports, relevant laboratory and other test results, and medications completed. Reviewed medications with patient and discussed importance of medication adherence Determined PCP sent referral to [facility] for evaluation and treatment of Anemia Provided  patient with the contact number and address for the location of such provider and instructed patient [place] should contact her to schedule appointment or patient may call when ready since referral was authorized, patient verbalizes understanding   Discussed plans with patient for ongoing care management follow up and provided patient with direct contact information for care management team Self Care Activities:  Self administers medications as prescribed Attends all scheduled provider appointments Calls pharmacy for medication refills Calls provider office for new concerns or questions Patient Goals: - call [facility] to schedule a new patient appointment  Follow Up Plan: Telephone follow up appointment with care management team member scheduled for: 07/12/21     Care Plan : Lung Mass  Updates made by Lynne Logan, RN since 05/27/2021 12:00 AM     Problem: Lung Mass   Priority: High     Long-Range Goal: Lung Mass evaluated and treated   Start Date: 05/27/2021  Expected End Date: 05/27/2022  This Visit's Progress: On track  Priority: High  Note:   Current Barriers:  Ineffective Self Health Maintenance in a patient with  Acquired hypothyroidism, Anxiety, Essential hypertension, Prediabetes, Hyperlipidemia, Anemia, Lung Mass Clinical Goal(s):  Collaboration with [name], FNP regarding development and update of comprehensive plan of care as evidenced by provider attestation and co-signature Inter-disciplinary care team collaboration (see longitudinal plan of care) patient will work with care management team to address care coordination and chronic  disease management needs related to Disease Management Educational Needs Care Coordination Medication Management and Education Medication Reconciliation Psychosocial Support   Interventions:  03/27/21 completed successful outbound call with patient  Evaluation of current treatment plan related to  Lung Mass  ,   self-management and patient's adherence to plan as established by provider. Collaboration with [name], FNP regarding development and update of comprehensive plan of care as evidenced by provider attestation       and co-signature Inter-disciplinary care team collaboration (see longitudinal plan of care) Review of patient status, including review of consultant's reports, relevant laboratory and other test results, and medications completed. Discussed incidental finding of lung mass following a chest CT during patient's recent IP event Discussed PCP sent referral to [facility] to further evaluate and treat Provided patient with the contact number and address for the location of such provider and instructed patient [place] should contact her to schedule appointment or patient may call when ready since referral was authorized, patient verbalizes understanding   Discussed plans with patient for ongoing care management follow up and provided patient with direct contact information for care management team Self Care Activities:  Self administers medications as prescribed Attends all scheduled provider appointments Calls pharmacy for medication refills Calls provider office for new concerns or questions Patient Goals: - call [facility] to schedule a new patient appointment for further evaluation and treatment of lung mass  Follow Up Plan: Telephone follow up appointment with care management team member scheduled for: 07/12/21      Plan:Telephone follow up appointment with care management team member scheduled for:  07/12/21  Barb Merino, RN, BSN, CCM Care Management Coordinator [facility]  Direct Phone: [phone]

## 2021-06-24 DIAGNOSIS — I10 Essential (primary) hypertension: Secondary | ICD-10-CM

## 2021-06-24 DIAGNOSIS — E785 Hyperlipidemia, unspecified: Secondary | ICD-10-CM

## 2021-06-24 DIAGNOSIS — E039 Hypothyroidism, unspecified: Secondary | ICD-10-CM

## 2021-06-24 DIAGNOSIS — D649 Anemia, unspecified: Secondary | ICD-10-CM | POA: Diagnosis not present

## 2021-06-26 ENCOUNTER — Ambulatory Visit: Payer: PPO | Admitting: Internal Medicine

## 2021-06-26 NOTE — Progress Notes (Deleted)
Cardiology Office Note:    Date:  06/26/2021   ID:  Abigail [name], DOB Dec 23, 1935, MRN [id number]  PCP:  [name], FNP   [facility] Providers Cardiologist:  Larae Grooms, MD     Referring MD: [name], FNP   CC: Transition to [place]? ***  History of Present Illness:    Abigail [name] is a 85 y.o. female with a hx CAD, CKD Stage IIIa, Obstructive CAD (RCA) with moderate non-obstructive LAD and Lcx with PCI to prox RCA.  Prior LHC was deferred in the setting significant anemia.  Patient seen in 6/22 with transition to plavix. Seen 04/11/21.Patient noted at that visit that chest pressure was her anginal equivalent.  During last eval had a bleeding event 04/15/21.  Had a lung mass on CT and a GI bleed. Seen 06/26/21.  Patient notes that she is doing ***.  Since day prior/last visit notes *** changes.  Relevant interval testing or therapy include ***.  There are no*** interval hospital/ED visit.    No chest pain or pressure ***.  No SOB/DOE*** and no PND/Orthopnea***.  No weight gain or leg swelling***.  No palpitations or syncope ***.  Ambulatory blood pressure ***.    Past Medical History:  Diagnosis Date   Anemia    Arthritis    knees, back   Carotid artery occlusion    Chronic kidney disease    stage 3 ckd no nephrologist   Complete uterine prolapse with prolapse of anterior vaginal wall    Complication of anesthesia    hard to wake up per pt   Constipation    Coronary artery disease    Diverticulitis yrs ago coialitis   History of blood transfusion    Hypertension    Hypothyroid    Numbness    in hands at times   Pre-diabetes    Scoliosis    STEMI (ST elevation myocardial infarction) ([place]) 10/26/2019   DES RCA   Wears dentures    full dentures   Wears glasses    for reading    Past Surgical History:  Procedure Laterality Date   ANTERIOR AND POSTERIOR REPAIR WITH SACROSPINOUS FIXATION N/A 12/20/2020   Procedure: SACROSPINOUS LIGAMENT  FIXATION;  Surgeon: [name], MD;  Location: [facility];  Service: Gynecology;  Laterality: N/A;  Total time requested for all procedures is 2 hours   [place] age 35   lower   bartholin cyst removal  age 54's   BLADDER SUSPENSION N/A 12/20/2020   Procedure: TRANSVAGINAL TAPE (TVT) PROCEDURE;  Surgeon: [name], MD;  Location: [facility];  Service: Gynecology;  Laterality: N/A;   CORONARY/GRAFT ACUTE MI REVASCULARIZATION N/A 10/26/2019   Procedure: Coronary/Graft Acute MI Revascularization;  Surgeon: Sherren Mocha, MD;  Location: [place] [facility];  Service: Cardiovascular;  Laterality: N/A;   CYSTOCELE REPAIR N/A 12/20/2020   Procedure: ANTERIOR AND POSTERIOR REPAIR WITH PERINEORRHAPHY;  Surgeon: [name], MD;  Location[facility];  Service: Gynecology;  Laterality: N/A;   CYSTOSCOPY N/A 12/20/2020   Procedure: CYSTOSCOPY;  Surgeon: [name], MD;  Location[facility];  Service: Gynecology;  Laterality: N/A;   ELBOW SURGERY  1990's   left   LEFT HEART CATH AND CORONARY ANGIOGRAPHY N/A 10/26/2019   Procedure: LEFT HEART CATH AND CORONARY ANGIOGRAPHY;  Surgeon: Sherren Mocha, MD;  Location: [place] [facility];  Service: Cardiovascular;  Laterality: N/A;    Current  Medications: No outpatient medications have been marked as taking for the 06/26/21 encounter (Appointment) with Werner Lean, MD.     Allergies:   Codeine and Norvasc [amlodipine]   Social History   Socioeconomic History   Marital status: Widowed    Spouse name: Not on file   Number of children: Not on file   Years of education: Not on file   Highest education level: Not on file  Occupational History   Occupation: semi retired  Tobacco Use   Smoking status: Former    Packs/day: 1.50    Years: 35.00    Pack years: 52.50    Types: Cigarettes    Quit date: 04/05/1986    Years since  quitting: 35.2   Smokeless tobacco: Never  Vaping Use   Vaping Use: Never used  Substance and Sexual Activity   Alcohol use: No   Drug use: No   Sexual activity: Not Currently    Birth control/protection: Post-menopausal  Other Topics Concern   Not on file  Social History Narrative   Not on file   Social Determinants of Health   Financial Resource Strain: Low Risk    Difficulty of Paying Living Expenses: Not hard at all  Food Insecurity: No Food Insecurity   Worried About Charity fundraiser in the Last Year: Never true   [place] in the Last Year: Never true  Transportation Needs: No Transportation Needs   Lack of Transportation (Medical): No   Lack of Transportation (Non-Medical): No  Physical Activity: Inactive   Days of Exercise per Week: 0 days   Minutes of Exercise per Session: 0 min  Stress: No Stress Concern Present   Feeling of Stress : Not at all  Social Connections: Not on file     Family History: The patient's family history includes Hypertension in her mother.  ROS:   Please see the history of present illness.     All other systems reviewed and are negative.  EKGs/Labs/Other Studies Reviewed:    The following studies were reviewed today:  EKG:  EKG is  ordered today.  The ekg ordered today demonstrates  04/11/21: SR rate 76  new RBBB 11/09/20: SR rate 77 inferior TWI   Transthoracic Echocardiogram: Date: 05/03/21 Results:  1. Left ventricular ejection fraction, by estimation, is 65 to 70%. Left  ventricular ejection fraction by PLAX is 68 %. The left ventricle has  normal function. The left ventricle has grossly normal regional wall  motion, however, endocardial definition  was limited and Definity was not used. Left ventricular diastolic  parameters are consistent with Grade I diastolic dysfunction (impaired  relaxation).   2. Right ventricular systolic function is normal. The right ventricular  size is not well visualized.   3. Left atrial  size was mildly dilated.   4. The mitral valve is grossly normal. No evidence of mitral valve  regurgitation.   5. The aortic valve was not well visualized. Aortic valve regurgitation  is not visualized.   6. The inferior vena cava is normal in size with greater than 50%  respiratory variability, suggesting right atrial pressure of 3 mmHg.   Comparison(s): Changes from prior study are noted. 10/27/2019: LVEF 60-65%.    Left/Right Heart Catheterizations: Date: 10/26/2019 Results: A drug-eluting stent was successfully placed using a SYNERGY XD 2.75X24. Post intervention, there is a 0% residual stenosis.   1.  Severe single-vessel coronary artery disease with a proximal RCA culprit lesion, treated successfully with primary  PCI using a 2.75 x 24 mm Synergy DES 2.  Mild diffuse nonobstructive disease involving the LAD and left circumflex without any significant stenoses 3.  Normal LVEDP 4.  Moderately severe diffuse residual stenosis in the mid and distal RCA, favor initial medical therapy.   Recent Labs: 01/01/2021: TSH 0.486 04/15/2021: ALT 22; BUN 33; Creatinine, Ser 1.27; Potassium 4.8; Sodium 137 04/18/2021: Hemoglobin 8.5; Platelets 345  Recent Lipid Panel    Component Value Date/Time   CHOL 142 05/16/2020 0913   TRIG 122 05/16/2020 0913   HDL 51 05/16/2020 0913   CHOLHDL 2.8 05/16/2020 0913   CHOLHDL 4.8 10/26/2019 2246   VLDL 40 10/26/2019 2246   LDLCALC 69 05/16/2020 0913    Physical Exam:    VS:  There were no vitals taken for this visit.    Wt Readings from Last 3 Encounters:  04/23/21 168 lb (76.2 kg)  04/18/21 168 lb (76.2 kg)  04/15/21 165 lb (74.8 kg)     GEN:  Well nourished, well developed in no acute distress HEENT: Normal NECK: No JVD LYMPHATICS: No lymphadenopathy CARDIAC: RRR, holosystolic murmur II/VI,  no rubs, gallops RESPIRATORY:  Clear to auscultation without rales, wheezing or rhonchi  ABDOMEN: Soft, non-tender, non-distended MUSCULOSKELETAL:  No  edema; No deformity  SKIN: Warm and dry NEUROLOGIC:  Alert and oriented x 3 PSYCHIATRIC:  Normal affect   ASSESSMENT:    No diagnosis found.  PLAN:    CAD  New RBBB and heart murmur Anemia HTN and HLD -planned for indefinite DAPT due to significant disease at 10/26/19 - continue ASA, patient felt better on brilinta than plavix - low threshold to start Imdur if future PRN ntitro use; in that regard patient  - will restart ambulatory BP monitoring (low BP at home) -    Medication Adjustments/Labs and Tests Ordered: Current medicines are reviewed at length with the patient today.  Concerns regarding medicines are outlined above.  No orders of the defined types were placed in this encounter.   No orders of the defined types were placed in this encounter.      Signed, Werner Lean, MD  06/26/2021 6:40 AM    [place]

## 2021-07-02 ENCOUNTER — Other Ambulatory Visit: Payer: Self-pay

## 2021-07-02 ENCOUNTER — Ambulatory Visit: Payer: PPO | Admitting: Emergency Medicine

## 2021-07-02 ENCOUNTER — Encounter: Payer: Self-pay | Admitting: Emergency Medicine

## 2021-07-02 VITALS — BP 126/76 | HR 72 | Temp 98.2°F | Ht 66.0 in | Wt 172.6 lb

## 2021-07-02 DIAGNOSIS — R918 Other nonspecific abnormal finding of lung field: Secondary | ICD-10-CM

## 2021-07-02 DIAGNOSIS — R911 Solitary pulmonary nodule: Secondary | ICD-10-CM | POA: Insufficient documentation

## 2021-07-02 NOTE — Assessment & Plan Note (Signed)
Left lower lobe pulmonary nodule in former smoker, highly likely to be primary malignancy.  Associated mediastinal and hilar adenopathy consistent with possible local metastatic disease.  Discussed options for diagnostics with her today.  I recommended navigational bronchoscopy and endobronchial ultrasound to achieve tissue diagnosis and allow [place] to plan therapeutics.  She understands and agrees.  We will try to get this set up soon as possible.  We will work on setting up bronchoscopy to evaluate your left lung mass.  This will be done under general anesthesia as an outpatient at [facility] endoscopy.  You will need a designated driver.  You would need to stop your aspirin 2 days prior to the procedure. We will order a repeat CT chest, PET scan to better characterize any changes in your lung mass prior to your procedure. We will consider pulmonary function testing at some point in the future to evaluate for COPD that may contribute to shortness of breath, weakness. Follow with Dr Lamonte Sakai in 1 month

## 2021-07-02 NOTE — Progress Notes (Signed)
Subjective:    Patient ID: Abigail [name], female    DOB: Aug 29, 1935, 85 y.o.   MRN: [id number]  HPI 85 year old woman, former smoker (28 pack years) with a history of CAD, carotid artery disease, CKD stage III, hypertension, hypothyroidism, scoliosis.  She is referred today for an abnormal CT scan of the chest as well as shortness of breath.  She not currently on any bronchodilator medications. No evidence overt bleeding, but she had anemia earlier this year - her anti-plt meds were stopped.   She has some occasional SOB, she has cough productive of clear mucous. She is experiencing weakness, no real exertional SOB.   CT scan of the chest performed 04/15/2021 which was performed in the ED when she was experiencing weakness.  Reviewed by me, shows a left lower lobe mass 2.5 x 3.0 cm with an enlarged left hilar lymph node 1.5 cm.    Review of Systems As per HPi  Past Medical History:  Diagnosis Date   Anemia    Arthritis    knees, back   Carotid artery occlusion    Chronic kidney disease    stage 3 ckd no nephrologist   Complete uterine prolapse with prolapse of anterior vaginal wall    Complication of anesthesia    hard to wake up per pt   Constipation    Coronary artery disease    Diverticulitis yrs ago coialitis   History of blood transfusion    Hypertension    Hypothyroid    Numbness    in hands at times   Pre-diabetes    Scoliosis    STEMI (ST elevation myocardial infarction) ([place]) 10/26/2019   DES RCA   Wears dentures    full dentures   Wears glasses    for reading     Family History  Problem Relation Age of Onset   Hypertension Mother      Social History   Socioeconomic History   Marital status: Widowed    Spouse name: Not on file   Number of children: Not on file   Years of education: Not on file   Highest education level: Not on file  Occupational History   Occupation: semi retired  Tobacco Use   Smoking status: Former    Packs/day: 1.50    Years:  35.00    Pack years: 52.50    Types: Cigarettes    Quit date: 04/05/1986    Years since quitting: 35.2   Smokeless tobacco: Never  Vaping Use   Vaping Use: Never used  Substance and Sexual Activity   Alcohol use: No   Drug use: No   Sexual activity: Not Currently    Birth control/protection: Post-menopausal  Other Topics Concern   Not on file  Social History Narrative   Not on file   Social Determinants of Health   Financial Resource Strain: Low Risk    Difficulty of Paying Living Expenses: Not hard at all  Food Insecurity: No Food Insecurity   Worried About Charity fundraiser in the Last Year: Never true   [place] in the Last Year: Never true  Transportation Needs: No Transportation Needs   Lack of Transportation (Medical): No   Lack of Transportation (Non-Medical): No  Physical Activity: Inactive   Days of Exercise per Week: 0 days   Minutes of Exercise per Session: 0 min  Stress: No Stress Concern Present   Feeling of Stress : Not at all  Social Connections: Not on  file  Intimate Partner Violence: Not on file     Allergies  Allergen Reactions   Codeine Nausea And Vomiting   Norvasc [Amlodipine] Rash and Other (See Comments)    rash     Outpatient Medications Prior to Visit  Medication Sig Dispense Refill   acetaminophen (TYLENOL) 325 MG tablet Take 2 tablets (650 mg total) by mouth every 6 (six) hours as needed for mild pain (or Fever >/= 101). (Patient taking differently: Take 500 mg by mouth every 6 (six) hours as needed for mild pain (or Fever >/= 101). prn)     aspirin EC 81 MG tablet Take 81 mg by mouth daily.     atorvastatin (LIPITOR) 80 MG tablet Take 1 tablet (80 mg total) by mouth daily at 6 PM. 90 tablet 3   Cranberry 1000 MG CAPS Take 1,000 mg by mouth daily at 12 noon.     cyclobenzaprine (FLEXERIL) 10 MG tablet Take 1 tablet (10 mg total) by mouth 3 (three) times daily as needed for muscle spasms. 30 tablet 1   docusate sodium (COLACE) 100  MG capsule Take 100-200 mg by mouth See admin instructions. Take 100 mg in the morning and 200 mg at bedtime     estradiol (ESTRACE) 0.1 MG/GM vaginal cream Place 0.5 g vaginally 2 (two) times a week. Place 0.5g twice a week at opening of vagina (Patient taking differently: Place 0.5 g vaginally. Place 0.5g twice a week at opening of vagina) 30 g 11   ferrous sulfate 300 (60 Fe) MG/5ML syrup Take 5 mLs (300 mg total) by mouth 2 (two) times daily with a meal. 300 mL 11   levothyroxine (SYNTHROID) 175 MCG tablet TAKE 1 TABLET BY MOUTH EVERY MORNING ON MONDAY - FRIDAY 90 tablet 1   lisinopril-hydrochlorothiazide (ZESTORETIC) 20-25 MG tablet Take 0.5 tablets by mouth 3 (three) times a week. Take on Monday Wednesday and Friday only 90 tablet 1   nitroGLYCERIN (NITROSTAT) 0.4 MG SL tablet Place 1 tablet (0.4 mg total) under the tongue every 5 (five) minutes x 3 doses as needed for chest pain. 25 tablet 3   sulfamethoxazole-trimethoprim (BACTRIM) 400-80 MG tablet Take 0.5 tablets by mouth daily. 30 tablet 5   Trospium Chloride 60 MG CP24 TAKE 1 CAPSULE BY MOUTH EVERY DAY 30 capsule 5   No facility-administered medications prior to visit.         Objective:   Physical Exam Vitals:   07/02/21 1045  BP: 126/76  Pulse: 72  Temp: 98.2 F (36.8 C)  TempSrc: Oral  SpO2: 98%  Weight: 172 lb 9.6 oz (78.3 kg)  Height: 5\' 6"  (1.676 m)   Gen: Pleasant, well-nourished, in no distress,  normal affect  ENT: No lesions,  mouth clear,  oropharynx clear, no postnasal drip  Neck: No JVD, no stridor  Lungs: No use of accessory muscles, no crackles or wheezing on normal respiration, no wheeze on forced expiration  Cardiovascular: RRR, heart sounds normal, no murmur or gallops, no peripheral edema  Musculoskeletal: No deformities, no cyanosis or clubbing  Neuro: alert, awake, non focal  Skin: Warm, no lesions or rash      Assessment & Plan:  Pulmonary nodule 1 cm or greater in diameter Left lower  lobe pulmonary nodule in former smoker, highly likely to be primary malignancy.  Associated mediastinal and hilar adenopathy consistent with possible local metastatic disease.  Discussed options for diagnostics with her today.  I recommended navigational bronchoscopy and endobronchial ultrasound to achieve  tissue diagnosis and allow [place] to plan therapeutics.  She understands and agrees.  We will try to get this set up soon as possible.  We will work on setting up bronchoscopy to evaluate your left lung mass.  This will be done under general anesthesia as an outpatient at [facility] endoscopy.  You will need a designated driver.  You would need to stop your aspirin 2 days prior to the procedure. We will order a repeat CT chest, PET scan to better characterize any changes in your lung mass prior to your procedure. We will consider pulmonary function testing at some point in the future to evaluate for COPD that may contribute to shortness of breath, weakness. Follow with Dr Lamonte Sakai in 1 month   Baltazar Apo, MD, PhD 07/02/2021, 7:22 PM [facility] [phone] or if no answer before 7:00PM call [phone] For any issues after 7:00PM please call eLink [phone]

## 2021-07-02 NOTE — Patient Instructions (Addendum)
We will work on setting up bronchoscopy to evaluate your left lung mass.  This will be done under general anesthesia as an outpatient at [facility] endoscopy.  You will need a designated driver.  You would need to stop your aspirin 2 days prior to the procedure. We will order a repeat CT chest, PET scan to better characterize any changes in your lung mass prior to your procedure. We will consider pulmonary function testing at some point in the future to evaluate for COPD that may contribute to shortness of breath, weakness. Follow with Dr Lamonte Sakai in 1 month

## 2021-07-02 NOTE — H&P (View-Only) (Signed)
Subjective:    Patient ID: Abigail [name], female    DOB: September 03, 1935, 85 y.o.   MRN: [id number]  HPI 85 year old woman, former smoker (33 pack years) with a history of CAD, carotid artery disease, CKD stage III, hypertension, hypothyroidism, scoliosis.  She is referred today for an abnormal CT scan of the chest as well as shortness of breath.  She not currently on any bronchodilator medications. No evidence overt bleeding, but she had anemia earlier this year - her anti-plt meds were stopped.   She has some occasional SOB, she has cough productive of clear mucous. She is experiencing weakness, no real exertional SOB.   CT scan of the chest performed 04/15/2021 which was performed in the ED when she was experiencing weakness.  Reviewed by me, shows a left lower lobe mass 2.5 x 3.0 cm with an enlarged left hilar lymph node 1.5 cm.    Review of Systems As per HPi  Past Medical History:  Diagnosis Date   Anemia    Arthritis    knees, back   Carotid artery occlusion    Chronic kidney disease    stage 3 ckd no nephrologist   Complete uterine prolapse with prolapse of anterior vaginal wall    Complication of anesthesia    hard to wake up per pt   Constipation    Coronary artery disease    Diverticulitis yrs ago coialitis   History of blood transfusion    Hypertension    Hypothyroid    Numbness    in hands at times   Pre-diabetes    Scoliosis    STEMI (ST elevation myocardial infarction) ([place]) 10/26/2019   DES RCA   Wears dentures    full dentures   Wears glasses    for reading     Family History  Problem Relation Age of Onset   Hypertension Mother      Social History   Socioeconomic History   Marital status: Widowed    Spouse name: Not on file   Number of children: Not on file   Years of education: Not on file   Highest education level: Not on file  Occupational History   Occupation: semi retired  Tobacco Use   Smoking status: Former    Packs/day: 1.50    Years:  35.00    Pack years: 52.50    Types: Cigarettes    Quit date: 04/05/1986    Years since quitting: 35.2   Smokeless tobacco: Never  Vaping Use   Vaping Use: Never used  Substance and Sexual Activity   Alcohol use: No   Drug use: No   Sexual activity: Not Currently    Birth control/protection: Post-menopausal  Other Topics Concern   Not on file  Social History Narrative   Not on file   Social Determinants of Health   Financial Resource Strain: Low Risk    Difficulty of Paying Living Expenses: Not hard at all  Food Insecurity: No Food Insecurity   Worried About Charity fundraiser in the Last Year: Never true   [place] in the Last Year: Never true  Transportation Needs: No Transportation Needs   Lack of Transportation (Medical): No   Lack of Transportation (Non-Medical): No  Physical Activity: Inactive   Days of Exercise per Week: 0 days   Minutes of Exercise per Session: 0 min  Stress: No Stress Concern Present   Feeling of Stress : Not at all  Social Connections: Not on  file  Intimate Partner Violence: Not on file     Allergies  Allergen Reactions   Codeine Nausea And Vomiting   Norvasc [Amlodipine] Rash and Other (See Comments)    rash     Outpatient Medications Prior to Visit  Medication Sig Dispense Refill   acetaminophen (TYLENOL) 325 MG tablet Take 2 tablets (650 mg total) by mouth every 6 (six) hours as needed for mild pain (or Fever >/= 101). (Patient taking differently: Take 500 mg by mouth every 6 (six) hours as needed for mild pain (or Fever >/= 101). prn)     aspirin EC 81 MG tablet Take 81 mg by mouth daily.     atorvastatin (LIPITOR) 80 MG tablet Take 1 tablet (80 mg total) by mouth daily at 6 PM. 90 tablet 3   Cranberry 1000 MG CAPS Take 1,000 mg by mouth daily at 12 noon.     cyclobenzaprine (FLEXERIL) 10 MG tablet Take 1 tablet (10 mg total) by mouth 3 (three) times daily as needed for muscle spasms. 30 tablet 1   docusate sodium (COLACE) 100  MG capsule Take 100-200 mg by mouth See admin instructions. Take 100 mg in the morning and 200 mg at bedtime     estradiol (ESTRACE) 0.1 MG/GM vaginal cream Place 0.5 g vaginally 2 (two) times a week. Place 0.5g twice a week at opening of vagina (Patient taking differently: Place 0.5 g vaginally. Place 0.5g twice a week at opening of vagina) 30 g 11   ferrous sulfate 300 (60 Fe) MG/5ML syrup Take 5 mLs (300 mg total) by mouth 2 (two) times daily with a meal. 300 mL 11   levothyroxine (SYNTHROID) 175 MCG tablet TAKE 1 TABLET BY MOUTH EVERY MORNING ON MONDAY - FRIDAY 90 tablet 1   lisinopril-hydrochlorothiazide (ZESTORETIC) 20-25 MG tablet Take 0.5 tablets by mouth 3 (three) times a week. Take on Monday Wednesday and Friday only 90 tablet 1   nitroGLYCERIN (NITROSTAT) 0.4 MG SL tablet Place 1 tablet (0.4 mg total) under the tongue every 5 (five) minutes x 3 doses as needed for chest pain. 25 tablet 3   sulfamethoxazole-trimethoprim (BACTRIM) 400-80 MG tablet Take 0.5 tablets by mouth daily. 30 tablet 5   Trospium Chloride 60 MG CP24 TAKE 1 CAPSULE BY MOUTH EVERY DAY 30 capsule 5   No facility-administered medications prior to visit.         Objective:   Physical Exam Vitals:   07/02/21 1045  BP: 126/76  Pulse: 72  Temp: 98.2 F (36.8 C)  TempSrc: Oral  SpO2: 98%  Weight: 172 lb 9.6 oz (78.3 kg)  Height: 5\' 6"  (1.676 m)   Gen: Pleasant, well-nourished, in no distress,  normal affect  ENT: No lesions,  mouth clear,  oropharynx clear, no postnasal drip  Neck: No JVD, no stridor  Lungs: No use of accessory muscles, no crackles or wheezing on normal respiration, no wheeze on forced expiration  Cardiovascular: RRR, heart sounds normal, no murmur or gallops, no peripheral edema  Musculoskeletal: No deformities, no cyanosis or clubbing  Neuro: alert, awake, non focal  Skin: Warm, no lesions or rash      Assessment & Plan:  Pulmonary nodule 1 cm or greater in diameter Left lower  lobe pulmonary nodule in former smoker, highly likely to be primary malignancy.  Associated mediastinal and hilar adenopathy consistent with possible local metastatic disease.  Discussed options for diagnostics with her today.  I recommended navigational bronchoscopy and endobronchial ultrasound to achieve  tissue diagnosis and allow [place] to plan therapeutics.  She understands and agrees.  We will try to get this set up soon as possible.  We will work on setting up bronchoscopy to evaluate your left lung mass.  This will be done under general anesthesia as an outpatient at [facility] endoscopy.  You will need a designated driver.  You would need to stop your aspirin 2 days prior to the procedure. We will order a repeat CT chest, PET scan to better characterize any changes in your lung mass prior to your procedure. We will consider pulmonary function testing at some point in the future to evaluate for COPD that may contribute to shortness of breath, weakness. Follow with Dr Lamonte Sakai in 1 month   Baltazar Apo, MD, PhD 07/02/2021, 7:22 PM [facility] [phone] or if no answer before 7:00PM call [phone] For any issues after 7:00PM please call eLink [phone]

## 2021-07-03 ENCOUNTER — Telehealth: Payer: Self-pay | Admitting: Emergency Medicine

## 2021-07-03 ENCOUNTER — Encounter: Payer: Self-pay | Admitting: Emergency Medicine

## 2021-07-03 NOTE — Telephone Encounter (Signed)
FYI to provider of the change in appointments for PET and CT scan on 07/09/21.

## 2021-07-03 NOTE — Telephone Encounter (Signed)
I have sent a message to [facility]re and [employer] this Pet is a combo case they are scheduled different and she said she didn't know she was supposed to have it

## 2021-07-03 NOTE — Telephone Encounter (Signed)
She has been moved to 11/15 @ 10am.

## 2021-07-03 NOTE — Telephone Encounter (Signed)
Thank you :)

## 2021-07-03 NOTE — Telephone Encounter (Signed)
Call made to patient, confirmed DOB. Patient is stating she thought she was supposed to have the PET before the Bronch not after.   Per the AVS PET should be before the Bronch.   Can we get this scheduled before the Bronch per RB. Thanks :)

## 2021-07-03 NOTE — Telephone Encounter (Signed)
I have scheduled the patient 's Bronch  Covid test 07/12/21 Bronch 07/15/21 arrive at 8:30am  Then the PET/Ct Combo is scheduled for 11/22 is this ok pt is confuse about appointments

## 2021-07-05 ENCOUNTER — Ambulatory Visit (INDEPENDENT_AMBULATORY_CARE_PROVIDER_SITE_OTHER): Payer: PPO | Admitting: Obstetrics and Gynecology

## 2021-07-05 ENCOUNTER — Other Ambulatory Visit: Payer: Self-pay

## 2021-07-05 ENCOUNTER — Encounter: Payer: Self-pay | Admitting: Obstetrics and Gynecology

## 2021-07-05 VITALS — BP 137/74 | HR 69

## 2021-07-05 DIAGNOSIS — M7918 Myalgia, other site: Secondary | ICD-10-CM | POA: Diagnosis not present

## 2021-07-05 DIAGNOSIS — N3281 Overactive bladder: Secondary | ICD-10-CM | POA: Diagnosis not present

## 2021-07-05 NOTE — Progress Notes (Signed)
[facility] Return Visit  SUBJECTIVE  History of Present Illness: Abigail [name] is a 85 y.o. female seen in follow-up for low hemoglobin.   Surgery: s/p anterior and posterior repair with perineorrhaphy, sacrospinous ligament fixation, midurethral sling and cystoscopy on 12/20/2020  Taking the Trospium 60mg . Denies any urine leakage except occasionally at night. Sometimes has strong urges to go to the bathroom. The medication has helped with urinary frequency.   In the last month, has had a lot of pain in the left buttock/ tailbone area. Having difficulty doing activities. Sometimes pain is stabbing and she just has to go to bed.   Has upcoming biopsy for lung mass- she states it is suspicious for cancer.   Past Medical History: Patient  has a past medical history of Anemia, Arthritis, Carotid artery occlusion, Chronic kidney disease, Complete uterine prolapse with prolapse of anterior vaginal wall, Complication of anesthesia, Constipation, Coronary artery disease, Diverticulitis (yrs ago coialitis), History of blood transfusion, Hypertension, Hypothyroid, Numbness, Pre-diabetes, Scoliosis, STEMI (ST elevation myocardial infarction) ([place]) (10/26/2019), Wears dentures, and Wears glasses.   Past Surgical History: She  has a past surgical history that includes Elbow surgery (1990's); Coronary/Graft Acute MI Revascularization (N/A, 10/26/2019); LEFT HEART CATH AND CORONARY ANGIOGRAPHY (N/A, 10/26/2019); Back surgery (1980 age 9); bartholin cyst removal (age 39's); Anterior and posterior repair with sacrospinous fixation (N/A, 12/20/2020); Cystocele repair (N/A, 12/20/2020); Bladder suspension (N/A, 12/20/2020); and Cystoscopy (N/A, 12/20/2020).   Medications: She has a current medication list which includes the following prescription(s): acetaminophen, aspirin ec, atorvastatin, cranberry, cyclobenzaprine, docusate sodium, estradiol, ferrous sulfate, levothyroxine,  lisinopril-hydrochlorothiazide, nitroglycerin, sulfamethoxazole-trimethoprim, and trospium chloride.   Allergies: Patient is allergic to codeine and norvasc [amlodipine].   Social History: Patient  reports that she quit smoking about 35 years ago. Her smoking use included cigarettes. She has a 52.50 pack-year smoking history. She has never used smokeless tobacco. She reports that she does not drink alcohol and does not use drugs.      OBJECTIVE     Physical Exam: Vitals:   07/05/21 1131  BP: 137/74  Pulse: 69   Gen: No apparent distress, A&O x 3.  Back: tenderness over left tailbone extending over left buttock   ASSESSMENT AND PLAN    Abigail [name] is a 85 y.o. with:  1. Buttock pain   2. Overactive bladder     - continue with Trospium 60mg  daily.  - Will refer to physical therapy for both the buttock/ tailbone pain as well as the urinary urgency.  - Return 3 months or sooner if needed  [name], MD  Time spent: I spent 20 minutes dedicated to the care of this patient on the date of this encounter to include pre-visit review of records, face-to-face time with the patient and post visit documentation.

## 2021-07-09 ENCOUNTER — Other Ambulatory Visit: Payer: Self-pay

## 2021-07-09 ENCOUNTER — Encounter (HOSPITAL_COMMUNITY)
Admission: RE | Admit: 2021-07-09 | Discharge: 2021-07-09 | Disposition: A | Discharge status: Home / Self Care | Payer: PPO | Source: Ambulatory Visit | Attending: Emergency Medicine | Admitting: Emergency Medicine

## 2021-07-09 DIAGNOSIS — J90 Pleural effusion, not elsewhere classified: Secondary | ICD-10-CM | POA: Diagnosis not present

## 2021-07-09 DIAGNOSIS — R918 Other nonspecific abnormal finding of lung field: Secondary | ICD-10-CM | POA: Diagnosis not present

## 2021-07-09 DIAGNOSIS — I700 Atherosclerosis of aorta: Secondary | ICD-10-CM | POA: Diagnosis not present

## 2021-07-09 DIAGNOSIS — J439 Emphysema, unspecified: Secondary | ICD-10-CM | POA: Diagnosis not present

## 2021-07-09 DIAGNOSIS — J9811 Atelectasis: Secondary | ICD-10-CM | POA: Diagnosis not present

## 2021-07-09 LAB — GLUCOSE, CAPILLARY: Glucose-Capillary: 129 mg/dL — ABNORMAL HIGH (ref 70–99)

## 2021-07-09 MED ORDER — FLUDEOXYGLUCOSE F - 18 (FDG) INJECTION
9.0000 | Freq: Once | INTRAVENOUS | Status: AC | PRN
  Administered 2021-07-09: 8.61 via INTRAVENOUS

## 2021-07-10 ENCOUNTER — Encounter: Payer: Self-pay | Admitting: Nurse Practitioner

## 2021-07-10 ENCOUNTER — Ambulatory Visit (INDEPENDENT_AMBULATORY_CARE_PROVIDER_SITE_OTHER): Payer: PPO | Admitting: Nurse Practitioner

## 2021-07-10 VITALS — BP 124/80 | HR 65 | Temp 98.2°F | Ht 61.0 in | Wt 172.0 lb

## 2021-07-10 DIAGNOSIS — I10 Essential (primary) hypertension: Secondary | ICD-10-CM

## 2021-07-10 DIAGNOSIS — M25561 Pain in right knee: Secondary | ICD-10-CM | POA: Diagnosis not present

## 2021-07-10 DIAGNOSIS — Z0001 Encounter for general adult medical examination with abnormal findings: Secondary | ICD-10-CM | POA: Diagnosis not present

## 2021-07-10 DIAGNOSIS — Z6832 Body mass index (BMI) 32.0-32.9, adult: Secondary | ICD-10-CM

## 2021-07-10 DIAGNOSIS — I700 Atherosclerosis of aorta: Secondary | ICD-10-CM

## 2021-07-10 DIAGNOSIS — R079 Chest pain, unspecified: Secondary | ICD-10-CM

## 2021-07-10 DIAGNOSIS — G8929 Other chronic pain: Secondary | ICD-10-CM

## 2021-07-10 DIAGNOSIS — R7303 Prediabetes: Secondary | ICD-10-CM | POA: Diagnosis not present

## 2021-07-10 DIAGNOSIS — Z79899 Other long term (current) drug therapy: Secondary | ICD-10-CM | POA: Diagnosis not present

## 2021-07-10 DIAGNOSIS — E039 Hypothyroidism, unspecified: Secondary | ICD-10-CM | POA: Diagnosis not present

## 2021-07-10 DIAGNOSIS — Z1389 Encounter for screening for other disorder: Secondary | ICD-10-CM

## 2021-07-10 DIAGNOSIS — E6609 Other obesity due to excess calories: Secondary | ICD-10-CM

## 2021-07-10 DIAGNOSIS — R82998 Other abnormal findings in urine: Secondary | ICD-10-CM | POA: Diagnosis not present

## 2021-07-10 DIAGNOSIS — K449 Diaphragmatic hernia without obstruction or gangrene: Secondary | ICD-10-CM | POA: Diagnosis not present

## 2021-07-10 DIAGNOSIS — Z0000 Encounter for general adult medical examination without abnormal findings: Secondary | ICD-10-CM

## 2021-07-10 LAB — CMP14+EGFR
ALT: 12 IU/L (ref 0–32)
AST: 16 IU/L (ref 0–40)
Albumin/Globulin Ratio: 1.7 (ref 1.2–2.2)
Albumin: 4.6 g/dL (ref 3.6–4.6)
Alkaline Phosphatase: 111 IU/L (ref 44–121)
BUN/Creatinine Ratio: 17 (ref 12–28)
BUN: 23 mg/dL (ref 8–27)
Bilirubin Total: 0.2 mg/dL (ref 0.0–1.2)
CO2: 24 mmol/L (ref 20–29)
Calcium: 9.5 mg/dL (ref 8.7–10.3)
Chloride: 101 mmol/L (ref 96–106)
Creatinine, Ser: 1.33 mg/dL — ABNORMAL HIGH (ref 0.57–1.00)
Globulin, Total: 2.7 g/dL (ref 1.5–4.5)
Glucose: 112 mg/dL — ABNORMAL HIGH (ref 70–99)
Potassium: 5.1 mmol/L (ref 3.5–5.2)
Sodium: 137 mmol/L (ref 134–144)
Total Protein: 7.3 g/dL (ref 6.0–8.5)
eGFR: 40 mL/min/{1.73_m2} — ABNORMAL LOW (ref >59)

## 2021-07-10 LAB — POCT URINALYSIS DIPSTICK
Bilirubin, UA: NEGATIVE
Glucose, UA: NEGATIVE
Ketones, UA: NEGATIVE
Nitrite, UA: NEGATIVE
Protein, UA: NEGATIVE
Spec Grav, UA: 1.015 (ref 1.010–1.025)
Urobilinogen, UA: 0.2 E.U./dL
pH, UA: 5.5 (ref 5.0–8.0)

## 2021-07-10 LAB — HEMOGLOBIN A1C
Est. average glucose Bld gHb Est-mCnc: 114 mg/dL
Hgb A1c MFr Bld: 5.6 % (ref 4.8–5.6)

## 2021-07-10 LAB — CBC
Hematocrit: 34 % (ref 34.0–46.6)
Hemoglobin: 10.8 g/dL — ABNORMAL LOW (ref 11.1–15.9)
MCH: 30.3 pg (ref 26.6–33.0)
MCHC: 31.8 g/dL (ref 31.5–35.7)
MCV: 96 fL (ref 79–97)
Platelets: 293 10*3/uL (ref 150–450)
RBC: 3.56 x10E6/uL — ABNORMAL LOW (ref 3.77–5.28)
RDW: 12.7 % (ref 11.7–15.4)
WBC: 5.9 10*3/uL (ref 3.4–10.8)

## 2021-07-10 LAB — LIPID PANEL
Chol/HDL Ratio: 3.5 ratio (ref 0.0–4.4)
Cholesterol, Total: 152 mg/dL (ref 100–199)
HDL: 44 mg/dL (ref >39)
LDL Chol Calc (NIH): 84 mg/dL (ref 0–99)
Triglycerides: 134 mg/dL (ref 0–149)
VLDL Cholesterol Cal: 24 mg/dL (ref 5–40)

## 2021-07-10 LAB — POCT UA - MICROALBUMIN
Albumin/Creatinine Ratio, Urine, POC: <30
Creatinine, POC: 300 mg/dL
Microalbumin Ur, POC: 30 mg/L

## 2021-07-10 NOTE — Progress Notes (Signed)
I,Yamilka J Llittleton,acting as a Education administrator for [employer], FNP.,have documented all relevant documentation on the behalf of [name], FNP,as directed by  [name], FNP while in the presence of [name], [place].   This visit occurred during the SARS-CoV-2 public health emergency.  Safety protocols were in place, including screening questions prior to the visit, additional usage of staff PPE, and extensive cleaning of exam room while observing appropriate contact time as indicated for disinfecting solutions.  Subjective:     Patient ID: Abigail [name] , female    DOB: [birth year] , 85 y.o.   MRN: [id number]   Chief Complaint  Patient presents with   Annual Exam   Cough    HPI  Patient presents today for her physical.   She stated she also thinks she has pneumonia. She stated she slept under a fan and still had the [facility] on as well. She complains of a cough and some pain in her left lung. She reports that she may have cancer in her left lung and wasn't sure if that was the real reason she is not feeling well.   Wt Readings from Last 3 Encounters: 07/10/21 : 172 lb (78 kg) 07/02/21 : 172 lb 9.6 oz (78.3 kg) 04/23/21 : 168 lb (76.2 kg)  Her weight documented in may 2022 was 218 lbs but this is incorrect.  She has seen Dr. Lamonte Sakai for her lung nodule and has recently had a PET scan, on Monday has a bronchoscopy.      Cough This is a chronic problem. Pertinent negatives include no chest pain or headaches.    Past Medical History:  Diagnosis Date   Anemia    Arthritis    knees, back   Carotid artery occlusion    Chronic kidney disease    stage 3 ckd no nephrologist   Complete uterine prolapse with prolapse of anterior vaginal wall    Complication of anesthesia    hard to wake up per pt   Constipation    Coronary artery disease    Diverticulitis yrs ago coialitis   Dyspnea    History of blood transfusion    Hypertension    Hypothyroid    Numbness    in hands at times    Pneumonia    Pre-diabetes    Scoliosis    STEMI (ST elevation myocardial infarction) ([place]) 10/26/2019   DES RCA   Wears dentures    full dentures   Wears glasses    for reading     Family History  Problem Relation Age of Onset   Hypertension Mother      Current Outpatient Medications:    acetaminophen (TYLENOL) 325 MG tablet, Take 2 tablets (650 mg total) by mouth every 6 (six) hours as needed for mild pain (or Fever >/= 101)., Disp: , Rfl:    aspirin EC 81 MG tablet, Take 81 mg by mouth at bedtime., Disp: , Rfl:    atorvastatin (LIPITOR) 80 MG tablet, Take 1 tablet (80 mg total) by mouth daily at 6 PM., Disp: 90 tablet, Rfl: 3   Cranberry 1000 MG CAPS, Take 1,000 mg by mouth daily at 12 noon., Disp: , Rfl:    cyclobenzaprine (FLEXERIL) 10 MG tablet, Take 1 tablet (10 mg total) by mouth 3 (three) times daily as needed for muscle spasms., Disp: 30 tablet, Rfl: 1   docusate sodium (COLACE) 100 MG capsule, Take 100-200 mg by mouth See admin instructions. Take 100 mg in the morning  and 200 mg at bedtime, Disp: , Rfl:    ferrous sulfate 300 (60 Fe) MG/5ML syrup, Take 5 mLs (300 mg total) by mouth 2 (two) times daily with a meal., Disp: 300 mL, Rfl: 11   levothyroxine (SYNTHROID) 175 MCG tablet, TAKE 1 TABLET BY MOUTH EVERY MORNING ON MONDAY - FRIDAY, Disp: 90 tablet, Rfl: 1   lisinopril-hydrochlorothiazide (ZESTORETIC) 20-25 MG tablet, Take 0.5 tablets by mouth 3 (three) times a week. Take on Monday Wednesday and Friday only, Disp: 90 tablet, Rfl: 1   nitroGLYCERIN (NITROSTAT) 0.4 MG SL tablet, Place 1 tablet (0.4 mg total) under the tongue every 5 (five) minutes x 3 doses as needed for chest pain., Disp: 25 tablet, Rfl: 3   sulfamethoxazole-trimethoprim (BACTRIM) 400-80 MG tablet, Take 0.5 tablets by mouth daily., Disp: 30 tablet, Rfl: 5   Trospium Chloride 60 MG CP24, TAKE 1 CAPSULE BY MOUTH EVERY DAY, Disp: 30 capsule, Rfl: 5   Cholecalciferol (VITAMIN D3 PO), Take 2 capsules by mouth  daily., Disp: , Rfl:    estradiol (ESTRACE) 0.1 MG/GM vaginal cream, Place 0.5 g vaginally once a week. Place 0.5g twice a week at opening of vagina, Disp: , Rfl:    Allergies  Allergen Reactions   Codeine Nausea And Vomiting   Norvasc [Amlodipine] Rash and Other (See Comments)    rash      The patient states she is post menopausal status  No LMP recorded. Patient is postmenopausal.  Negative for: breast discharge, breast lump(s), breast pain and breast self exam. Associated symptoms include abnormal vaginal bleeding. Pertinent negatives include abnormal bleeding (hematology), anxiety, decreased libido, depression, difficulty falling sleep, dyspareunia, history of infertility, nocturia, sexual dysfunction, sleep disturbances, urinary incontinence, urinary urgency, vaginal discharge and vaginal itching. Diet regular; she reports having a decreased appetite. She eats "lots" of wheat thins. She does eat chicken, some beef. She has been eating "rocky road". The patient states her exercise level is minimal due to shortness of breath   The patient's tobacco use is:  Social History   Tobacco Use  Smoking Status Former   Packs/day: 1.50   Years: 35.00   Pack years: 52.50   Types: Cigarettes   Quit date: 04/05/1986   Years since quitting: 35.3  Smokeless Tobacco Never   She has been exposed to passive smoke. The patient's alcohol use is:  Social History   Substance and Sexual Activity  Alcohol Use No     Review of Systems  Constitutional: Negative.   HENT: Negative.    Eyes: Negative.   Respiratory:  Positive for cough.   Cardiovascular: Negative.  Negative for chest pain, palpitations and leg swelling.  Gastrointestinal: Negative.   Endocrine: Negative.   Genitourinary: Negative.   Musculoskeletal: Negative.   Skin: Negative.   Allergic/Immunologic: Negative.   Neurological: Negative.  Negative for dizziness and headaches.  Hematological: Negative.   Psychiatric/Behavioral:  Negative.      Today's Vitals   07/10/21 0926  BP: 124/80  Pulse: 65  Temp: 98.2 F (36.8 C)  TempSrc: Oral  Weight: 172 lb (78 kg)  Height: _0  (1.549 m)   Body mass index is 32.5 kg/m.  Wt Readings from Last 3 Encounters:  07/15/21 172 lb (78 kg)  07/10/21 172 lb (78 kg)  07/02/21 172 lb 9.6 oz (78.3 kg)    Objective:  Physical Exam Constitutional:      General: She is not in acute distress.    Appearance: Normal appearance. She is well-developed. She  is obese.  HENT:     Head: Normocephalic and atraumatic.     Right Ear: Hearing, tympanic membrane, ear canal and external ear normal. There is no impacted cerumen.     Left Ear: Hearing, tympanic membrane, ear canal and external ear normal. There is no impacted cerumen.     Nose:     Comments: Deferred - masked    Mouth/Throat:     Comments: Deferred - masked Eyes:     General: Lids are normal.     Extraocular Movements: Extraocular movements intact.     Conjunctiva/sclera: Conjunctivae normal.     Pupils: Pupils are equal, round, and reactive to light.     Funduscopic exam:    Right eye: No papilledema.        Left eye: No papilledema.  Neck:     Thyroid: No thyroid mass.     Vascular: No carotid bruit.  Cardiovascular:     Rate and Rhythm: Normal rate and regular rhythm.     Pulses: Normal pulses.     Heart sounds: Normal heart sounds. No murmur heard. Pulmonary:     Effort: Pulmonary effort is normal. No respiratory distress.     Breath sounds: Normal breath sounds. No wheezing.  Chest:     Chest wall: No mass.  Breasts:    Tanner Score is 5.     Right: Normal. No mass or tenderness.     Left: Normal. No mass or tenderness.  Abdominal:     General: Abdomen is flat. Bowel sounds are normal. There is no distension.     Palpations: Abdomen is soft.     Tenderness: There is no abdominal tenderness.  Musculoskeletal:        General: Tenderness (bilateral knees) present. No swelling. Normal range of  motion.     Cervical back: Full passive range of motion without pain, normal range of motion and neck supple.     Right lower leg: No edema.     Left lower leg: No edema.  Lymphadenopathy:     Upper Body:     Right upper body: No supraclavicular, axillary or pectoral adenopathy.     Left upper body: No supraclavicular, axillary or pectoral adenopathy.  Skin:    General: Skin is warm and dry.     Capillary Refill: Capillary refill takes less than 2 seconds.     Comments: She has multiple abnormal shaped nevi. Daughter has a history of myeloma per patient  Neurological:     General: No focal deficit present.     Mental Status: She is alert and oriented to person, place, and time.     Cranial Nerves: No cranial nerve deficit.     Sensory: No sensory deficit.  Psychiatric:        Mood and Affect: Mood normal.        Behavior: Behavior normal.        Thought Content: Thought content normal.        Judgment: Judgment normal.        Assessment And Plan:     1. Encounter for general adult medical examination w/o abnormal findings  2. Atherosclerosis of aorta ([place]) Comments: Tolerating statin well.   3. Acquired hypothyroidism Comments: Stable, continue current medications.   4. Essential hypertension Comments: Good control, continue current medications - CMP14+EGFR - POCT Urinalysis Dipstick (81002) - POCT UA - Microalbumin  5. Prediabetes Comments: Stable, continue with diet control - CMP14+EGFR - Lipid panel - Hemoglobin A1c  6. Chest pain with moderate risk for cardiac etiology Comments: She has been followed by Cardiology, this may be related to her nodule to her lungs  7. Class 1 obesity due to excess calories with body mass index (BMI) of 32.0 to 32.9 in adult, unspecified whether serious comorbidity present  8. Hiatal hernia Comments: She was found to have a hiatal hernia with her Chest CT, will refer to general surgery for further evaluation to see if needs  surgical intervention - Ambulatory referral to General Surgery  9. Encounter for surveillance of abnormal nevi - Ambulatory referral to Dermatology  10. Other long term (current) drug therapy - CBC  11. Chronic pain of right knee Comments: 2 years ago injury after sister falling on knee, she is not interested in a referral at this time. Encouraged to use Voltaren Gel as needed  12. Leukocytes in urine Comments: No current symptoms will send urine for culture - Culture, Urine  She will check with her pulmonologist about vaccines  Patient was given opportunity to ask questions. Patient verbalized understanding of the plan and was able to repeat key elements of the plan. All questions were answered to their satisfaction.   [name], FNP   I, [name], FNP, have reviewed all documentation for this visit. The documentation on 07/10/21 for the exam, diagnosis, procedures, and orders are all accurate and complete.   THE PATIENT IS ENCOURAGED TO PRACTICE SOCIAL DISTANCING DUE TO THE COVID-19 PANDEMIC.

## 2021-07-10 NOTE — Patient Instructions (Signed)

## 2021-07-11 ENCOUNTER — Other Ambulatory Visit: Payer: Self-pay

## 2021-07-11 ENCOUNTER — Encounter (HOSPITAL_COMMUNITY): Payer: Self-pay | Admitting: Emergency Medicine

## 2021-07-11 NOTE — Progress Notes (Signed)
Spoke with pt for pre-op call. Pt has hx of CAD, sees Dr. Irish Lack. Last office visit was 04/11/21. Pt states her last dose of Aspirin will be Friday PM. Instructed to hold 2 days prior to procedure. Pt states she is pre-diabetic. Last A1C was 6.0 on 07/10/21. Pt requested that I give pre-op instructions to  her son also, which I did.   Chart sent to Anesthesia PA - Heart history  Pt getting Covid test done tomorrow, 07/12/21. Pt instructed to wear a mask if she goes out in public or around people who don't normally live with her. She voiced understanding.

## 2021-07-12 ENCOUNTER — Other Ambulatory Visit: Payer: Self-pay | Admitting: Emergency Medicine

## 2021-07-12 ENCOUNTER — Telehealth: Payer: PPO

## 2021-07-12 NOTE — Progress Notes (Signed)
Anesthesia Chart Review: Abigail [name]  Case: [id number] Date/Time: 07/15/21 1100   Procedures:      ROBOTIC ASSISTED NAVIGATIONAL BRONCHOSCOPY     ENDOBRONCHIAL ULTRASOUND   Anesthesia type: General   Pre-op diagnosis: LEFT LOWER LOPE MASS WITH MEDIASTINAL ADENOPATHY   Location: [place] [facility]   Surgeons: Collene Gobble, MD       DISCUSSION: Patient is an 85 year old female scheduled for the above procedure.  History includes former smoker (quit 04/05/86), CAD (inferior STEMI 10/26/19, s/p DES RCA), HTN, carotid artery disease (minimal plaque 07/08/18 CTA neck), pre-diabetes, CKD (stage III), anemia, dyspnea, hypothyroidism, scoliosis, spinal surgery (1980), vaginal prolapse/cystocele (s/p AP repair with perineorrhaphy, sacrospinous ligament hysteropexy, midurethral sling, cystoscopy 12/20/20).   - [place] admission 11/08/20-11/10/20. She had reported worsening SOB and use of Nitro at 11/08/20 cardiology. Labs ordered for consideration of LHC, but labs showed HGB 5.6, and she was referred to the ED where she reported vaginal bleeding for several months and constipation with darker stools. S/p 2 units PRBC. GYN consulted. Pessary had been placed for cystocele with frequent cystitis. Pessy causing irritation, so removed and referred to Dr. Wannetta Sender with URO-GYN. Severe anemia thought to be the source of her symptoms. S/p AP repair with midurethral sling 12/20/20.   [name] ED visit 04/15/21 for weakness. Home BP 99/40. Plavix recently discontinued due to anemia. Denied chest pain, SOB, dizziness on arrival. Hemoccult negative. BP actually elevated in the ED. Had brief episode of chest pain, but troponins negative and EKG stable, so not felt to be cardiac. CXR showed left lung mass. CT showed LLL lung mass with enlarged left hilar LN, moderate-large hiatal hernia, left renal cysts. Findings discussed and ED provider sent messages to primary care and cardiology. Seen by  [name], FNP on 04/18/21 and referred to GI given ongoing anemia following AP repair/pessary removal for GU bleed. Referred to pulmonology for lung mass and seen by Dr. Lamonte Sakai on 07/02/21. He advised holding ASA two days prior to procedure. Last ASA 07/12/21.    - Last visit with cardiology was on 04/11/21 by Dr. Rudean Haskell. She had initially been switched from Brilinta to Plavix on 02/12/21 for CAD/residual RCA disease with anemia. She reported not doing as well with Plavix and had had a single episode of chest pain. EKG showed RBBB and murmur heard, so echo ordered that showed LVEF 65-70%, grossly normal regional wall motion, grade 1 DD, normal RVSF, MV grossly normal, AV no well visualized but no AR noted. If further chest pain would consider Imdur. Three month follow-up with Dr. Irish Lack planned. Dr. Irish Lack acknowledged 04/15/21 message from ED provider and advised patient follow-up with PCP for source of bleeding. Plavix held until further evaluation.   Presurgical COVID-19 test is scheduled for 07/12/2021.  Anesthesia team to evaluate on the day of surgery.  She had labs through her PCP on 07/10/2021.    VS:  BP Readings from Last 3 Encounters:  07/10/21 124/80  07/05/21 137/74  07/02/21 126/76   Pulse Readings from Last 3 Encounters:  07/10/21 65  07/05/21 69  07/02/21 72     PROVIDERS: [name], FNP is PCP Larae Grooms, MD is cardiologist Sherlene Shams, MD is URO-GYN   LABS: Most recent labs results include: Lab Results  Component Value Date   WBC 5.9 07/10/2021   HGB 10.8 (L) 07/10/2021   HCT 34.0 07/10/2021   PLT 293 07/10/2021   GLUCOSE 112 (H)  07/10/2021   CHOL 152 07/10/2021   TRIG 134 07/10/2021   HDL 44 07/10/2021   LDLCALC 84 07/10/2021   ALT 12 07/10/2021   AST 16 07/10/2021   NA 137 07/10/2021   K 5.1 07/10/2021   CL 101 07/10/2021   CREATININE 1.33 (H) 07/10/2021   BUN 23 07/10/2021   CO2 24 07/10/2021   TSH 0.486 01/01/2021    INR 1.0 04/15/2021   HGBA1C 5.6 07/10/2021   MICROALBUR 30 07/10/2021        IMAGES: PET Scan 07/09/21: IMPRESSION: 1. 3.2 cm superior segment left lower lobe lung mass consistent with primary lung neoplasm. 2. Left hilar and subcarinal adenopathy. 3. No metastatic pulmonary nodules. 4. Small left pleural effusion and patchy overlying atelectasis. 5. Large hiatal hernia. 6. Stable advanced atherosclerotic calcifications and emphysema.   CT Chest 04/15/21: IMPRESSION: 1. Left lower lobe lung mass is identified with enlarged left hilar lymph node. Findings are concerning for primary bronchogenic carcinoma. Recommend referral to multi disciplinary thoracic oncology for further management. 2. Aortic atherosclerosis and coronary artery calcifications. 3. Moderate to large hiatal hernia. 4. Left kidney cysts including mildly complicated cyst arising off the posterior cortex of the left kidney. This does not require immediate attention. More definitive characterization with nonemergent CT or MRI without and with contrast material is advised. MRI would be the study of choice, preferably as an outpatient following resolution of any acute clinical condition.   CT Head/CTA head & neck 07/08/18: IMPRESSION: - Head CT: No acute intracranial finding. Chronic small-vessel ischemic changes of the cerebral hemispheric white matter. - Neck CTA: Atherosclerotic disease at both carotid bifurcations. 20% stenosis of the proximal ICA on the right. No stenosis measurable on the left. 30% stenosis at both vertebral artery origins. - Head CTA: No intracranial large or medium vessel occlusion or correctable stenosis.:    EKG: 04/15/21: NSR. RBBB. Anterolateral T wave abnormality, consider secondary changes related to RBBB. Inferior T wave abnormality, consider ischemia.     CV: Echo 05/03/21: IMPRESSIONS   1. Left ventricular ejection fraction, by estimation, is 65 to 70%. Left  ventricular  ejection fraction by PLAX is 68 %. The left ventricle has  normal function. The left ventricle has grossly normal regional wall  motion, however, endocardial definition  was limited and Definity was not used. Left ventricular diastolic  parameters are consistent with Grade I diastolic dysfunction (impaired  relaxation).   2. Right ventricular systolic function is normal. The right ventricular  size is not well visualized.   3. Left atrial size was mildly dilated.   4. The mitral valve is grossly normal. No evidence of mitral valve  regurgitation.   5. The aortic valve was not well visualized. Aortic valve regurgitation  is not visualized.   6. The inferior vena cava is normal in size with greater than 50%  respiratory variability, suggesting right atrial pressure of 3 mmHg.  - Comparison(s): Changes from prior study are noted. 10/27/2019: LVEF 60-65%.    LHC 10/26/19: A drug-eluting stent was successfully placed using a SYNERGY XD 2.75X24. Post intervention, there is a 0% residual stenosis.   1.  Severe single-vessel coronary artery disease with a proximal RCA culprit lesion, treated successfully with primary PCI using a 2.75 x 24 mm Synergy DES 2.  Mild diffuse nonobstructive disease involving the LAD and left circumflex without any significant stenoses 3.  Normal LVEDP 4.  Moderately severe diffuse residual stenosis in the mid and distal RCA, favor initial medical  therapy.   Cardiac event monitor 01/12/19-02/10/19: Study Highlights: Predominantly normal sinus rhythm. Bradycardia noted, presumably during sleep. One episode of possible rate controlled atrial flutter. Refer to EP for possible ILR given question of atrial flutter and near-syncope.    Past Medical History:  Diagnosis Date   Anemia    Arthritis    knees, back   Carotid artery occlusion    Chronic kidney disease    stage 3 ckd no nephrologist   Complete uterine prolapse with prolapse of anterior vaginal wall     Complication of anesthesia    hard to wake up per pt   Constipation    Coronary artery disease    Diverticulitis yrs ago coialitis   Dyspnea    History of blood transfusion    Hypertension    Hypothyroid    Numbness    in hands at times   Pneumonia    Pre-diabetes    Scoliosis    STEMI (ST elevation myocardial infarction) ([place]) 10/26/2019   DES RCA   Wears dentures    full dentures   Wears glasses    for reading    Past Surgical History:  Procedure Laterality Date   ANTERIOR AND POSTERIOR REPAIR WITH SACROSPINOUS FIXATION N/A 12/20/2020   Procedure: SACROSPINOUS LIGAMENT FIXATION;  Surgeon: [name], MD;  Location: [facility];  Service: Gynecology;  Laterality: N/A;  Total time requested for all procedures is 2 hours   [place] age 4   lower   bartholin cyst removal  age 4's   BLADDER SUSPENSION N/A 12/20/2020   Procedure: TRANSVAGINAL TAPE (TVT) PROCEDURE;  Surgeon: [name], MD;  Location: [facility];  Service: Gynecology;  Laterality: N/A;   CORONARY/GRAFT ACUTE MI REVASCULARIZATION N/A 10/26/2019   Procedure: Coronary/Graft Acute MI Revascularization;  Surgeon: Sherren Mocha, MD;  Location: [facility];  Service: Cardiovascular;  Laterality: N/A;   CYSTOCELE REPAIR N/A 12/20/2020   Procedure: ANTERIOR AND POSTERIOR REPAIR WITH PERINEORRHAPHY;  Surgeon: [name], MD;  Location: [facility];  Service: Gynecology;  Laterality: N/A;   CYSTOSCOPY N/A 12/20/2020   Procedure: CYSTOSCOPY;  Surgeon: [name], MD;  Location: [facility];  Service: Gynecology;  Laterality: N/A;   ELBOW SURGERY  1990's   left   LEFT HEART CATH AND CORONARY ANGIOGRAPHY N/A 10/26/2019   Procedure: LEFT HEART CATH AND CORONARY ANGIOGRAPHY;  Surgeon: Sherren Mocha, MD;  Location: [facility];  Service: Cardiovascular;  Laterality: N/A;    MEDICATIONS: No current  facility-administered medications for this encounter.    acetaminophen (TYLENOL) 325 MG tablet   aspirin EC 81 MG tablet   atorvastatin (LIPITOR) 80 MG tablet   Cholecalciferol (VITAMIN D3 PO)   Cranberry 1000 MG CAPS   cyclobenzaprine (FLEXERIL) 10 MG tablet   docusate sodium (COLACE) 100 MG capsule   estradiol (ESTRACE) 0.1 MG/GM vaginal cream   ferrous sulfate 300 (60 Fe) MG/5ML syrup   levothyroxine (SYNTHROID) 175 MCG tablet   lisinopril-hydrochlorothiazide (ZESTORETIC) 20-25 MG tablet   nitroGLYCERIN (NITROSTAT) 0.4 MG SL tablet   sulfamethoxazole-trimethoprim (BACTRIM) 400-80 MG tablet   Trospium Chloride 60 MG CP24    Myra Gianotti, PA-C Surgical Short Stay/Anesthesiology [facility] Phone [phone] [facility] Phone [phone] 07/12/2021 2:08 PM

## 2021-07-12 NOTE — Anesthesia Preprocedure Evaluation (Addendum)
Anesthesia Evaluation  Patient identified by MRN, date of birth, ID band Patient awake    Reviewed: Allergy & Precautions, NPO status , Patient's Chart, lab work & pertinent test results  Airway Mallampati: II  TM Distance: >3 FB     Dental   Pulmonary shortness of breath, pneumonia, former smoker,    breath sounds clear to auscultation       Cardiovascular hypertension, + CAD and + Past MI  + dysrhythmias + Valvular Problems/Murmurs  Rhythm:Irregular Rate:Normal  Echo 05/03/21: IMPRESSIONS  1. Left ventricular ejection fraction, by estimation, is 65 to 70%. Left  ventricular ejection fraction by PLAX is 68 %. The left ventricle has  normal function. The left ventricle has grossly normal regional wall  motion, however, endocardial definition  was limited and Definity was not used. Left ventricular diastolic  parameters are consistent with Grade I diastolic dysfunction (impaired  relaxation).  2. Right ventricular systolic function is normal. The right ventricular  size is not well visualized.  3. Left atrial size was mildly dilated.  4. The mitral valve is grossly normal. No evidence of mitral valve  regurgitation.  5. The aortic valve was not well visualized. Aortic valve regurgitation  is not visualized.  6. The inferior vena cava is normal in size with greater than 50%  respiratory variability, suggesting right atrial pressure of 3 mmHg.  - Comparison(s): Changes from prior study are noted. 10/27/2019: LVEF 60-65%.    Neuro/Psych    GI/Hepatic negative GI ROS, Neg liver ROS,   Endo/Other  Hypothyroidism   Renal/GU Renal disease     Musculoskeletal   Abdominal   Peds  Hematology   Anesthesia Other Findings   Reproductive/Obstetrics                            Anesthesia Physical Anesthesia Plan  ASA: 3  Anesthesia Plan: General   Post-op Pain Management:    Induction:   PONV  Risk Score and Plan: 3 and Ondansetron, Dexamethasone and Midazolam  Airway Management Planned: Oral ETT  Additional Equipment:   Intra-op Plan:   Post-operative Plan: Possible Post-op intubation/ventilation  Informed Consent: I have reviewed the patients History and Physical, chart, labs and discussed the procedure including the risks, benefits and alternatives for the proposed anesthesia with the patient or authorized representative who has indicated his/her understanding and acceptance.     Dental advisory given  Plan Discussed with: CRNA and Anesthesiologist  Anesthesia Plan Comments: (PAT note written 07/12/2021 by Myra Gianotti, PA-C. )       Anesthesia Quick Evaluation

## 2021-07-13 LAB — SARS CORONAVIRUS 2 (TAT 6-24 HRS): SARS Coronavirus 2: NEGATIVE

## 2021-07-15 ENCOUNTER — Encounter (HOSPITAL_COMMUNITY): Payer: Self-pay | Admitting: Emergency Medicine

## 2021-07-15 ENCOUNTER — Ambulatory Visit (HOSPITAL_COMMUNITY): Payer: PPO

## 2021-07-15 ENCOUNTER — Ambulatory Visit (HOSPITAL_COMMUNITY): Payer: PPO | Admitting: Vascular Surgery

## 2021-07-15 ENCOUNTER — Ambulatory Visit (HOSPITAL_COMMUNITY)
Admission: RE | Admit: 2021-07-15 | Discharge: 2021-07-15 | Disposition: A | Discharge status: Home / Self Care | Payer: PPO | Attending: Emergency Medicine | Admitting: Emergency Medicine

## 2021-07-15 ENCOUNTER — Encounter (HOSPITAL_COMMUNITY)
Admission: RE | Disposition: A | Discharge status: Home / Self Care | Payer: Self-pay | Source: Home / Self Care | Attending: Emergency Medicine

## 2021-07-15 DIAGNOSIS — I10 Essential (primary) hypertension: Secondary | ICD-10-CM | POA: Insufficient documentation

## 2021-07-15 DIAGNOSIS — R911 Solitary pulmonary nodule: Secondary | ICD-10-CM | POA: Diagnosis not present

## 2021-07-15 DIAGNOSIS — I2510 Atherosclerotic heart disease of native coronary artery without angina pectoris: Secondary | ICD-10-CM | POA: Insufficient documentation

## 2021-07-15 DIAGNOSIS — Z419 Encounter for procedure for purposes other than remedying health state, unspecified: Secondary | ICD-10-CM | POA: Diagnosis not present

## 2021-07-15 DIAGNOSIS — C3490 Malignant neoplasm of unspecified part of unspecified bronchus or lung: Secondary | ICD-10-CM | POA: Diagnosis not present

## 2021-07-15 DIAGNOSIS — I252 Old myocardial infarction: Secondary | ICD-10-CM | POA: Diagnosis not present

## 2021-07-15 DIAGNOSIS — Z87891 Personal history of nicotine dependence: Secondary | ICD-10-CM | POA: Diagnosis not present

## 2021-07-15 DIAGNOSIS — R590 Localized enlarged lymph nodes: Secondary | ICD-10-CM

## 2021-07-15 DIAGNOSIS — N952 Postmenopausal atrophic vaginitis: Secondary | ICD-10-CM | POA: Diagnosis not present

## 2021-07-15 DIAGNOSIS — C801 Malignant (primary) neoplasm, unspecified: Secondary | ICD-10-CM | POA: Diagnosis not present

## 2021-07-15 DIAGNOSIS — E039 Hypothyroidism, unspecified: Secondary | ICD-10-CM | POA: Insufficient documentation

## 2021-07-15 DIAGNOSIS — R918 Other nonspecific abnormal finding of lung field: Secondary | ICD-10-CM | POA: Insufficient documentation

## 2021-07-15 DIAGNOSIS — C3432 Malignant neoplasm of lower lobe, left bronchus or lung: Secondary | ICD-10-CM | POA: Diagnosis not present

## 2021-07-15 DIAGNOSIS — E785 Hyperlipidemia, unspecified: Secondary | ICD-10-CM | POA: Diagnosis not present

## 2021-07-15 DIAGNOSIS — C771 Secondary and unspecified malignant neoplasm of intrathoracic lymph nodes: Secondary | ICD-10-CM | POA: Diagnosis not present

## 2021-07-15 HISTORY — PX: BRONCHIAL BRUSHINGS: SHX5108

## 2021-07-15 HISTORY — PX: BRONCHIAL NEEDLE ASPIRATION BIOPSY: SHX5106

## 2021-07-15 HISTORY — DX: Pneumonia, unspecified organism: J18.9

## 2021-07-15 HISTORY — DX: Dyspnea, unspecified: R06.00

## 2021-07-15 HISTORY — PX: ENDOBRONCHIAL ULTRASOUND: SHX5096

## 2021-07-15 HISTORY — PX: HEMOSTASIS CONTROL: SHX6838

## 2021-07-15 LAB — GLUCOSE, CAPILLARY: Glucose-Capillary: 101 mg/dL — ABNORMAL HIGH (ref 70–99)

## 2021-07-15 SURGERY — ENDOBRONCHIAL ULTRASOUND (EBUS)
Anesthesia: General

## 2021-07-15 MED ORDER — SUCCINYLCHOLINE CHLORIDE 200 MG/10ML IV SOSY
PREFILLED_SYRINGE | INTRAVENOUS | Status: DC | PRN
  Administered 2021-07-15: 120 mg via INTRAVENOUS

## 2021-07-15 MED ORDER — PROPOFOL 10 MG/ML IV BOLUS
INTRAVENOUS | Status: DC | PRN
  Administered 2021-07-15: 50 mg via INTRAVENOUS
  Administered 2021-07-15: 150 mg via INTRAVENOUS

## 2021-07-15 MED ORDER — SODIUM CHLORIDE (PF) 0.9 % IJ SOLN
PREFILLED_SYRINGE | INTRAMUSCULAR | Status: DC | PRN
  Administered 2021-07-15: 4 mL

## 2021-07-15 MED ORDER — SUGAMMADEX SODIUM 200 MG/2ML IV SOLN
INTRAVENOUS | Status: DC | PRN
  Administered 2021-07-15: 400 mg via INTRAVENOUS

## 2021-07-15 MED ORDER — GLYCOPYRROLATE PF 0.2 MG/ML IJ SOSY
PREFILLED_SYRINGE | INTRAMUSCULAR | Status: DC | PRN
  Administered 2021-07-15: .1 mg via INTRAVENOUS

## 2021-07-15 MED ORDER — FENTANYL CITRATE (PF) 100 MCG/2ML IJ SOLN: 25.0000 ug | INTRAMUSCULAR | Status: DC | PRN

## 2021-07-15 MED ORDER — CHLORHEXIDINE GLUCONATE 0.12 % MT SOLN
OROMUCOSAL | Status: AC
  Administered 2021-07-15: 15 mL via OROMUCOSAL
  Filled 2021-07-15: qty 15

## 2021-07-15 MED ORDER — CHLORHEXIDINE GLUCONATE 0.12 % MT SOLN
15.0000 mL | Freq: Once | OROMUCOSAL | Status: AC
  Filled 2021-07-15: qty 15

## 2021-07-15 MED ORDER — PHENYLEPHRINE HCL-NACL 20-0.9 MG/250ML-% IV SOLN
INTRAVENOUS | Status: DC | PRN
  Administered 2021-07-15: 50 ug/min via INTRAVENOUS

## 2021-07-15 MED ORDER — LIDOCAINE 2% (20 MG/ML) 5 ML SYRINGE
INTRAMUSCULAR | Status: DC | PRN
  Administered 2021-07-15: 40 mg via INTRAVENOUS

## 2021-07-15 MED ORDER — DEXAMETHASONE SODIUM PHOSPHATE 10 MG/ML IJ SOLN
INTRAMUSCULAR | Status: DC | PRN
  Administered 2021-07-15: 10 mg via INTRAVENOUS

## 2021-07-15 MED ORDER — LACTATED RINGERS IV SOLN: INTRAVENOUS | Status: DC

## 2021-07-15 MED ORDER — FENTANYL CITRATE (PF) 100 MCG/2ML IJ SOLN
INTRAMUSCULAR | Status: DC | PRN
  Administered 2021-07-15: 50 ug via INTRAVENOUS

## 2021-07-15 MED ORDER — ROCURONIUM BROMIDE 10 MG/ML (PF) SYRINGE
PREFILLED_SYRINGE | INTRAVENOUS | Status: DC | PRN
  Administered 2021-07-15: 50 mg via INTRAVENOUS

## 2021-07-15 MED ORDER — ESTRADIOL 0.1 MG/GM VA CREA: 0.5000 g | TOPICAL_CREAM | VAGINAL | Status: DC

## 2021-07-15 MED ORDER — ONDANSETRON HCL 4 MG/2ML IJ SOLN
INTRAMUSCULAR | Status: DC | PRN
  Administered 2021-07-15: 4 mg via INTRAVENOUS

## 2021-07-15 NOTE — Discharge Instructions (Addendum)
Flexible Bronchoscopy, Care After This sheet gives you information about how to care for yourself after your test. Your doctor may also give you more specific instructions. If you have problems or questions, contact your doctor. Follow these instructions at home: Eating and drinking Do not eat or drink anything (not even water) for 2 hours after your test, or until your numbing medicine (local anesthetic) wears off. When your numbness is gone and your cough and gag reflexes have come back, you may: Eat only soft foods. Slowly drink liquids. The day after the test, go back to your normal diet. Driving Do not drive for 24 hours if you were given a medicine to help you relax (sedative). Do not drive or use heavy machinery while taking prescription pain medicine. General instructions  Take over-the-counter and prescription medicines only as told by your doctor. Return to your normal activities as told. Ask what activities are safe for you. Do not use any products that have nicotine or tobacco in them. This includes cigarettes and e-cigarettes. If you need help quitting, ask your doctor. Keep all follow-up visits as told by your doctor. This is important. It is very important if you had a tissue sample (biopsy) taken. Get help right away if: You have shortness of breath that gets worse. You get light-headed. You feel like you are going to pass out (faint). You have chest pain. You cough up: More than a little blood. More blood than before. Summary Do not eat or drink anything (not even water) for 2 hours after your test, or until your numbing medicine wears off. Do not use cigarettes. Do not use e-cigarettes. Get help right away if you have chest pain.  Please call our office for any questions or concerns.  [phone].  This information is not intended to replace advice given to you by your health care provider. Make sure you discuss any questions you have with your health care  provider. Document Released: 06/08/2009 Document Revised: 07/24/2017 Document Reviewed: 08/29/2016 Elsevier Patient Education  2020 [employer].

## 2021-07-15 NOTE — Anesthesia Postprocedure Evaluation (Signed)
Anesthesia Post Note  Patient: Abigail [name]  Procedure(s) Performed: ENDOBRONCHIAL ULTRASOUND HEMOSTASIS CONTROL BRONCHIAL BRUSHINGS BRONCHIAL NEEDLE ASPIRATION BIOPSIES     Patient location during evaluation: PACU Anesthesia Type: General Level of consciousness: awake Pain management: pain level controlled Vital Signs Assessment: post-procedure vital signs reviewed and stable Respiratory status: spontaneous breathing Cardiovascular status: stable Postop Assessment: no apparent nausea or vomiting Anesthetic complications: no   No notable events documented.  Last Vitals:  Vitals:   07/15/21 1118 07/15/21 1133  BP: (!) 157/72 135/60  Pulse: 61 68  Resp: 18 20  Temp: 36.5 C   SpO2: 98% 97%    Last Pain:  Vitals:   07/15/21 1118  PainSc: 0-No pain                 Kynleigh Artz

## 2021-07-15 NOTE — Op Note (Signed)
Video Bronchoscopy with Endobronchial Ultrasound Procedure Note  Date of Operation: 07/15/2021  Pre-op Diagnosis: Left lower lobe mass, mediastinal adenopathy  Post-op Diagnosis: Same  Surgeon: Baltazar Apo  Assistants: None  Anesthesia: General endotracheal anesthesia  Operation: Flexible video fiberoptic bronchoscopy with endobronchial ultrasound and biopsies.  Estimated Blood Loss: Minimal  Complications: None apparent  Indications and History: Abigail [name] is a 85 y.o. female with history of tobacco use.  She was found to have a left lower lobe mass on CT scan of the chest with associated hilar and mediastinal adenopathy.  Recommendation was made to achieve a tissue diagnosis via bronchoscopy with endobronchial ultrasound.  The risks, benefits, complications, treatment options and expected outcomes were discussed with the patient.  The possibilities of pneumothorax, pneumonia, reaction to medication, pulmonary aspiration, perforation of a viscus, bleeding, failure to diagnose a condition and creating a complication requiring transfusion or operation were discussed with the patient who freely signed the consent.    Description of Procedure: The patient was examined in the preoperative area and history and data from the preprocedure consultation were reviewed. It was deemed appropriate to proceed.  The patient was taken to 3, identified as Abigail [name] and the procedure verified as Flexible Video Fiberoptic Bronchoscopy.  A Time Out was held and the above information confirmed. After being taken to the operating room general anesthesia was initiated and the patient  was orally intubated. The video fiberoptic bronchoscope was introduced via the endotracheal tube and a general inspection was performed which showed normal airways on the right.  The main carina was sharp.  The left upper lobe airways were widely patent.  The suprasegmental left lower lobe airways had a white vascular lobulated  endobronchial lesion that almost fully occluded these bronchi.  Instruments would pass the lesions.  Endobronchial brushings were performed at this location for cytology. The standard scope was then withdrawn and the endobronchial ultrasound was used to identify and characterize the peritracheal, hilar and bronchial lymph nodes. Inspection showed a slightly enlarged node at station 4R.  There was significant enlargement at stations 4L, 7, 10 L. Using real-time ultrasound guidance Wang needle biopsies were take from Station 4R, 4L, 7, 10 L nodes and were sent for cytology. The patient tolerated the procedure well without apparent complications. There was no significant blood loss. The bronchoscope was withdrawn. Anesthesia was reversed and the patient was taken to the PACU for recovery.   Samples: 1. Wang needle biopsies from 4R node 2. Wang needle biopsies from 4L node 3. Wang needle biopsies from 7 node 4. Wang needle biopsies from 10 L node 5.  Endobronchial brushings from the left lower lobe superior segmental airway  Plans:  The patient will be discharged from the PACU to home when recovered from anesthesia. We will review the cytology, pathology results with the patient when they become available. Outpatient followup will be with Dr. Lamonte Sakai.    Baltazar Apo, MD, PhD 07/15/2021, 11:14 AM [facility] [phone] or if no answer before 7:00PM call [phone] For any issues after 7:00PM please call eLink [phone]

## 2021-07-15 NOTE — Transfer of Care (Signed)
Immediate Anesthesia Transfer of Care Note  Patient: Abigail [name]  Procedure(s) Performed: ENDOBRONCHIAL ULTRASOUND HEMOSTASIS CONTROL BRONCHIAL BRUSHINGS BRONCHIAL NEEDLE ASPIRATION BIOPSIES  Patient Location: PACU  Anesthesia Type:General  Level of Consciousness: awake and alert   Airway & Oxygen Therapy: Patient Spontanous Breathing and Patient connected to face mask oxygen  Post-op Assessment: Report given to RN and Post -op Vital signs reviewed and stable  Post vital signs: Reviewed and stable  Last Vitals:  Vitals Value Taken Time  BP 157/72 07/15/21 1118  Temp    Pulse 62 07/15/21 1122  Resp 13 07/15/21 1122  SpO2 97 % 07/15/21 1122  Vitals shown include unvalidated device data.  Last Pain: There were no vitals filed for this visit.       Complications: No notable events documented.

## 2021-07-15 NOTE — Anesthesia Procedure Notes (Addendum)
Procedure Name: Intubation Date/Time: 07/15/2021 10:02 AM Performed by: Lorie Phenix, CRNA Pre-anesthesia Checklist: Patient identified, Emergency Drugs available, Suction available and Patient being monitored Patient Re-evaluated:Patient Re-evaluated prior to induction Oxygen Delivery Method: Circle system utilized Preoxygenation: Pre-oxygenation with 100% oxygen Induction Type: IV induction Ventilation: Mask ventilation without difficulty Laryngoscope Size: Mac and 3 Grade View: Grade II Tube type: Oral Tube size: 8.5 mm Number of attempts: 1 Airway Equipment and Method: Stylet and Oral airway Placement Confirmation: ETT inserted through vocal cords under direct vision, positive ETCO2 and breath sounds checked- equal and bilateral Secured at: 22 cm Tube secured with: Tape Dental Injury: Teeth and Oropharynx as per pre-operative assessment

## 2021-07-15 NOTE — Interval H&P Note (Signed)
History and Physical Interval Note:  07/15/2021 9:37 AM  Abigail [name]  has presented today for surgery, with the diagnosis of LEFT LOWER LOPE MASS WITH MEDIASTINAL ADENOPATHY.  The various methods of treatment have been discussed with the patient and family. After consideration of risks, benefits and other options for treatment, the patient has consented to  Procedure(s): ROBOTIC ASSISTED NAVIGATIONAL BRONCHOSCOPY (N/A) ENDOBRONCHIAL ULTRASOUND (N/A) as a surgical intervention.  The patient's history has been reviewed, patient examined, no change in status, stable for surgery.  I have reviewed the patient's chart and labs.  Questions were answered to the patient's satisfaction.     Collene Gobble

## 2021-07-16 ENCOUNTER — Other Ambulatory Visit (HOSPITAL_COMMUNITY): Payer: PPO

## 2021-07-17 ENCOUNTER — Telehealth: Payer: Self-pay | Admitting: Emergency Medicine

## 2021-07-17 DIAGNOSIS — C3492 Malignant neoplasm of unspecified part of left bronchus or lung: Secondary | ICD-10-CM

## 2021-07-17 LAB — CYTOLOGY - NON PAP

## 2021-07-17 NOTE — Telephone Encounter (Signed)
Called patient to review FOB results, no answer so left a message. 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 Will call her back.   Was able to speak with the patient, reviewed results w her. She agrees with referral to [facility], will make the referral today.

## 2021-07-19 ENCOUNTER — Encounter (HOSPITAL_COMMUNITY): Payer: Self-pay | Admitting: Emergency Medicine

## 2021-07-19 ENCOUNTER — Encounter: Payer: Self-pay | Admitting: *Deleted

## 2021-07-19 DIAGNOSIS — R590 Localized enlarged lymph nodes: Secondary | ICD-10-CM

## 2021-07-19 NOTE — Progress Notes (Signed)
Orders for labs completed per protocol.

## 2021-07-22 DIAGNOSIS — C3490 Malignant neoplasm of unspecified part of unspecified bronchus or lung: Secondary | ICD-10-CM | POA: Diagnosis not present

## 2021-07-24 ENCOUNTER — Telehealth: Payer: Self-pay | Admitting: Internal Medicine

## 2021-07-24 NOTE — Progress Notes (Signed)
Radiation Oncology         [phone]) [phone] ________________________________  Multidisciplinary Thoracic Oncology Clinic [facility]) Initial Outpatient Consultation  Name: Abigail [name] MRN: [id number]  Date: 07/25/2021  DOB: 1936/03/26  [id number], Doreene Burke, FNP  Collene Gobble, MD   REFERRING PHYSICIAN: Collene Gobble, MD  DIAGNOSIS: The encounter diagnosis was Malignant neoplasm of unspecified part of unspecified bronchus or lung ([place]).    ICD-10-CM   1. Malignant neoplasm of unspecified part of unspecified bronchus or lung (HCC)  C34.90       Adenocarcinoma of the left lower lobe, clinical stage IIIa, (cT2a, cN2, Mx)  pending brain MRI  HISTORY OF PRESENT ILLNESS::Abigail [name] is a 85 y.o. female who presented to the ED on 04/15/21 with acute onset weakness lasting for 1 hour in duration. CXR performed during ED course revealed a new 3 cm mass-like opacity in the posterior mid to upper left lung. Chest CT performed further revealed the left lower lobe mass with an enlarged left hilar lymph node. Overall, findings were noted as concerning for primary bronchogenic carcinoma.   Subsequently, the patient was referred to Dr. Lamonte Sakai on 07/02/21 who recommended navigational bronchoscopy and biopsies.   PET on 07/09/21 demonstrated the hypermetabolic 3.2 cm superior segment left lower lobe pulmonary mass as consistent with primary lung neoplasm. Associated left hilar and mediastinal metastatic adenopathy were appreciated as well. No findings for pulmonary metastatic disease, abdominal/pelvic metastatic disease, or osseous metastatic disease were appreciated. Lastly, a small left pleural effusion and patchy overlying atelectasis were noted.   Bronchoscopy on 07/15/21 revealed the suprasegmental left lower lobe airways with a white vascular lobulated endobronchial lesion. LLL endobrochial brushing revealed malignant cells consistent with adenocarcinoma. Biopsies of the 4L, station 7, and 10L lymph nodes  revealed malignant cells consistent with adenocarcinoma. (No malignancy identified from biopsy of the 4R lymph node).   The patient was referred today for presentation in the multidisciplinary conference.  Radiology studies and pathology slides were presented there for review and discussion of treatment options.  A consensus was discussed regarding potential next steps.  PREVIOUS RADIATION THERAPY: No  PAST MEDICAL HISTORY:  has a past medical history of Anemia, Arthritis, Carotid artery occlusion, Chronic kidney disease, Complete uterine prolapse with prolapse of anterior vaginal wall, Complication of anesthesia, Constipation, Coronary artery disease, Diverticulitis (yrs ago coialitis), Dyspnea, History of blood transfusion, Hypertension, Hypothyroid, Numbness, Pneumonia, Pre-diabetes, Scoliosis, STEMI (ST elevation myocardial infarction) ([place]) (10/26/2019), Wears dentures, and Wears glasses.    PAST SURGICAL HISTORY: Past Surgical History:  Procedure Laterality Date   ANTERIOR AND POSTERIOR REPAIR WITH SACROSPINOUS FIXATION N/A 12/20/2020   Procedure: SACROSPINOUS LIGAMENT FIXATION;  Surgeon: [name], MD;  Location: [facility];  Service: Gynecology;  Laterality: N/A;  Total time requested for all procedures is 2 hours   [place] age 15   lower   bartholin cyst removal  age 71's   BLADDER SUSPENSION N/A 12/20/2020   Procedure: TRANSVAGINAL TAPE (TVT) PROCEDURE;  Surgeon: [name], MD;  Location: [facility];  Service: Gynecology;  Laterality: N/A;   BRONCHIAL BRUSHINGS  07/15/2021   Procedure: BRONCHIAL BRUSHINGS;  Surgeon: Collene Gobble, MD;  Location[facility] ENDOSCOPY;  Service: Pulmonary;;   BRONCHIAL NEEDLE ASPIRATION BIOPSY  07/15/2021   Procedure: BRONCHIAL NEEDLE ASPIRATION BIOPSIES;  Surgeon: Collene Gobble, MD;  Location: [facility] ENDOSCOPY;  Service: Pulmonary;;   CORONARY/GRAFT ACUTE MI REVASCULARIZATION N/A 10/26/2019    Procedure: Coronary/Graft Acute  MI Revascularization;  Surgeon: Sherren Mocha, MD;  Location: [place] [facility];  Service: Cardiovascular;  Laterality: N/A;   CYSTOCELE REPAIR N/A 12/20/2020   Procedure: ANTERIOR AND POSTERIOR REPAIR WITH PERINEORRHAPHY;  Surgeon: [name], MD;  Location: [facility];  Service: Gynecology;  Laterality: N/A;   CYSTOSCOPY N/A 12/20/2020   Procedure: CYSTOSCOPY;  Surgeon: [name], MD;  Location: [facility];  Service: Gynecology;  Laterality: N/A;   ELBOW SURGERY  1990's   left   ENDOBRONCHIAL ULTRASOUND N/A 07/15/2021   Procedure: ENDOBRONCHIAL ULTRASOUND;  Surgeon: Collene Gobble, MD;  Location: [facility] ENDOSCOPY;  Service: Pulmonary;  Laterality: N/A;   HEMOSTASIS CONTROL  07/15/2021   Procedure: HEMOSTASIS CONTROL;  Surgeon: Collene Gobble, MD;  Location: [facility] ENDOSCOPY;  Service: Pulmonary;;   LEFT HEART CATH AND CORONARY ANGIOGRAPHY N/A 10/26/2019   Procedure: LEFT HEART CATH AND CORONARY ANGIOGRAPHY;  Surgeon: Sherren Mocha, MD;  Location: [place] [facility];  Service: Cardiovascular;  Laterality: N/A;   TONSILLECTOMY      FAMILY HISTORY: family history includes Hypertension in her mother.  Also significant for daughter diagnosed with melanoma and breast cancer  SOCIAL HISTORY:  reports that she quit smoking about 35 years ago. Her smoking use included cigarettes. She has a 52.50 pack-year smoking history. She has never used smokeless tobacco. She reports that she does not drink alcohol and does not use drugs.  She quit smoking in 1987  ALLERGIES: Codeine and Norvasc [amlodipine]  MEDICATIONS:  Current Outpatient Medications  Medication Sig Dispense Refill   acetaminophen (TYLENOL) 325 MG tablet Take 2 tablets (650 mg total) by mouth every 6 (six) hours as needed for mild pain (or Fever >/= 101).     aspirin EC 81 MG tablet Take 81 mg by mouth at bedtime.     atorvastatin (LIPITOR) 80 MG tablet  Take 1 tablet (80 mg total) by mouth daily at 6 PM. 90 tablet 3   Cholecalciferol (VITAMIN D3 PO) Take 2 capsules by mouth daily.     Cranberry 1000 MG CAPS Take 1,000 mg by mouth daily at 12 noon.     cyclobenzaprine (FLEXERIL) 10 MG tablet Take 1 tablet (10 mg total) by mouth 3 (three) times daily as needed for muscle spasms. 30 tablet 1   docusate sodium (COLACE) 100 MG capsule Take 100-200 mg by mouth See admin instructions. Take 100 mg in the morning and 200 mg at bedtime     estradiol (ESTRACE) 0.1 MG/GM vaginal cream Place 0.5 g vaginally once a week. Place 0.5g twice a week at opening of vagina     ferrous sulfate 300 (60 Fe) MG/5ML syrup Take 5 mLs (300 mg total) by mouth 2 (two) times daily with a meal. 300 mL 11   levothyroxine (SYNTHROID) 175 MCG tablet TAKE 1 TABLET BY MOUTH EVERY MORNING ON MONDAY - FRIDAY 90 tablet 1   lisinopril-hydrochlorothiazide (ZESTORETIC) 20-25 MG tablet Take 0.5 tablets by mouth 3 (three) times a week. Take on Monday Wednesday and Friday only 90 tablet 1   nitroGLYCERIN (NITROSTAT) 0.4 MG SL tablet Place 1 tablet (0.4 mg total) under the tongue every 5 (five) minutes x 3 doses as needed for chest pain. 25 tablet 3   prochlorperazine (COMPAZINE) 10 MG tablet Take 1 tablet (10 mg total) by mouth every 6 (six) hours as needed for nausea or vomiting. 30 tablet 0   sulfamethoxazole-trimethoprim (BACTRIM) 400-80 MG tablet Take 0.5 tablets by mouth daily.  30 tablet 5   Trospium Chloride 60 MG CP24 TAKE 1 CAPSULE BY MOUTH EVERY DAY 30 capsule 5   No current facility-administered medications for this encounter.    REVIEW OF SYSTEMS:  A 15 point review of systems is documented in the electronic medical record. This was obtained by the nursing staff. However, I reviewed this with the patient to discuss relevant findings and make appropriate changes.  She denies any headaches or visual problems.  She has chronic low back pain.  She reports weight gain over the past year.   She denies any significant pain within the chest area cough or hemoptysis.  She denies any breathing difficulties at this time.  She does report chronic fatigue at this time.   PHYSICAL EXAM:  weight is 173 lb 3.2 oz (78.6 kg). Her temperature is 98.4 F (36.9 C). Her blood pressure is 152/63 (abnormal) and her pulse is 76. Her respiration is 20 and oxygen saturation is 96%.   General: Alert and oriented, in no acute distress, sitting in wheelchair for evaluation but she does report ambulating well.  She does do her own grocery shopping and drives a car.  The patient's son reports moving in with her to help out as needed. HEENT: Head is normocephalic. Extraocular movements are intact. Oropharynx is clear.  Dentures in place. Neck: Neck is supple, no palpable cervical or supraclavicular lymphadenopathy. Heart: Regular in rate and rhythm with no murmurs, rubs, or gallops. Chest: Clear to auscultation bilaterally, with no rhonchi, wheezes, or rales. Abdomen: Soft, nontender, nondistended, with no rigidity or guarding. Extremities: No cyanosis or edema. Lymphatics: see Neck Exam Skin: No concerning lesions. Musculoskeletal: symmetric strength and muscle tone throughout. Neurologic: Cranial nerves II through XII are grossly intact. No obvious focalities. Speech is fluent. Coordination is intact. Psychiatric: Judgment and insight are intact. Affect is appropriate.    KPS = 80  100 - Normal; no complaints; no evidence of disease. 90   - Able to carry on normal activity; minor signs or symptoms of disease. 80   - Normal activity with effort; some signs or symptoms of disease. 56   - Cares for self; unable to carry on normal activity or to do active work. 60   - Requires occasional assistance, but is able to care for most of his personal needs. 50   - Requires considerable assistance and frequent medical care. 7   - Disabled; requires special care and assistance. 70   - Severely disabled; hospital  admission is indicated although death not imminent. 69   - Very sick; hospital admission necessary; active supportive treatment necessary. 10   - Moribund; fatal processes progressing rapidly. 0     - Dead  Karnofsky DA, Abelmann [place], Craver LS and Burchenal [facility] [phone]) The use of the nitrogen mustards in the palliative treatment of carcinoma: with particular reference to bronchogenic carcinoma Cancer 1 [id number]  LABORATORY DATA:  Lab Results  Component Value Date   WBC 5.8 07/25/2021   HGB 10.6 (L) 07/25/2021   HCT 34.0 (L) 07/25/2021   MCV 97.1 07/25/2021   PLT 258 07/25/2021   Lab Results  Component Value Date   NA 140 07/25/2021   K 4.3 07/25/2021   CL 106 07/25/2021   CO2 25 07/25/2021   Lab Results  Component Value Date   ALT 12 07/25/2021   AST 15 07/25/2021   ALKPHOS 118 07/25/2021   BILITOT <0.2 (L) 07/25/2021    PULMONARY FUNCTION TEST:   Recent  Review Flowsheet Data   There is no flowsheet data to display.     RADIOGRAPHY: NM PET Image Initial (PI) Skull Base To Thigh  Result Date: 07/09/2021 CLINICAL DATA:  Initial treatment strategy for left lower lobe lung mass. EXAM: NUCLEAR MEDICINE PET SKULL BASE TO THIGH TECHNIQUE: 8.60 mCi F-18 FDG was injected intravenously. Full-ring PET imaging was performed from the skull base to thigh after the radiotracer. CT data was obtained and used for attenuation correction and anatomic localization. Fasting blood glucose: 129 mg/dl COMPARISON:  Chest CT 04/15/2021 FINDINGS: Mediastinal blood pool activity: SUV max 2.35 Liver activity: SUV max NA NECK: No hypermetabolic lymph nodes in the neck. Incidental CT findings: Bilateral carotid artery calcifications. CHEST: The superior segment left lower lobe lung mass is hypermetabolic with SUV max of 75.1. Cyst the aided left hilar lymphadenopathy the as demonstrated on the prior CT scan. SUV max is 20.31. There is also AP window and subcarinal. No metastatic pulmonary nodules are  identified. No enlarged or hypermetabolic axillary or supraclavicular nodes. No breast masses. Incidental CT findings: Stable advanced atherosclerotic calcifications involving the aorta and coronary arteries. Large hiatal hernia. ABDOMEN/PELVIS: No findings suspicious for abdominal/pelvic metastatic disease. No hepatic or adrenal gland lesions. No metastatic adenopathy. Incidental CT findings: Advanced aortic calcifications but no aneurysm. Bilateral renal cysts. There is a slightly hyperdense lesion involving the midpole region of the left kidney which is an indeterminate finding but likely a hemorrhagic or proteinaceous cyst. Attention on follow-up scans is suggested. For pole left renal calculus. Gas in the bladder likely recent catheterization. SKELETON: No findings suspicious for osseous metastatic disease. Incidental CT findings: none IMPRESSION: 1. Hypermetabolic 3.2 cm superior segment left lower lobe pulmonary mass consistent with primary lung neoplasm. Associated left hilar and mediastinal metastatic adenopathy. 2. No findings for pulmonary metastatic disease, abdominal/pelvic metastatic disease or osseous metastatic disease. 3. Stable advanced vascular disease and emphysema. Electronically Signed   By: Marijo Sanes M.D.   On: 07/09/2021 16:36   NM PET SUPER D CT  Result Date: 07/09/2021 CLINICAL DATA:  Preop for EMB and biopsy. EXAM: CT CHEST WITHOUT CONTRAST TECHNIQUE: Multidetector CT imaging of the chest was performed using thin slice collimation for electromagnetic bronchoscopy planning purposes, without intravenous contrast. COMPARISON:  PET CT, same day and prior CT scan 04/15/2021 FINDINGS: Cardiovascular: The heart is normal in size. No pericardial effusion. Stable advanced atherosclerotic calcifications involving the thoracic aorta and branch vessels including three-vessel coronary artery calcifications. Mediastinum/Nodes: Left hilar and subcarinal adenopathy the. The esophagus is grossly  normal. There is a large hiatal hernia. Lungs/Pleura: The 3.2 cm superior segment left lower lobe mass was hypermetabolic on the PET-CT consistent with primary lung neoplasm. Stable underlying emphysematous changes. No metastatic pulmonary nodules are identified. There is a small left pleural effusion and patchy overlying atelectasis. Upper Abdomen: No significant upper abdominal findings. Advanced aortic calcifications. Musculoskeletal: No breast masses, supraclavicular or axillary adenopathy. The bony thorax is intact. Severe degenerative changes involving the thoracic spine. IMPRESSION: 1. 3.2 cm superior segment left lower lobe lung mass consistent with primary lung neoplasm. 2. Left hilar and subcarinal adenopathy. 3. No metastatic pulmonary nodules. 4. Small left pleural effusion and patchy overlying atelectasis. 5. Large hiatal hernia. 6. Stable advanced atherosclerotic calcifications and emphysema. Aortic Atherosclerosis (ICD10-I70.0) and Emphysema (ICD10-J43.9). The Electronically Signed   By: Marijo Sanes M.D.   On: 07/09/2021 16:40      IMPRESSION: Adenocarcinoma of the left lower lobe, clinical stage IIIa.  Patient will  proceed with a brain MRI to complete staging work-up.  Assuming the brain MRI is negative she will be a good candidate for a 6-week course of radiation along with radiosensitizing chemotherapy.  Discussed the general course of treatment, side effects and potential toxicities of chest radiation therapy with the patient and her husband.  She appears to understand and wishes to proceed with planned course of treatment.  We reviewed the consent form for treatment and she signed and has retained a copy for her records.  PLAN: Patient will proceed with a brain MRI as above.  She is scheduled for chest CT simulation next week with treatments to begin week of December 12 along with radiosensitizing chemotherapy.  If the patient's brain MRI shows metastatic disease then the overall  treatment plan will change.     ------------------------------------------------  Blair Promise, PhD, MD  This document serves as a record of services personally performed by Gery Pray, MD. It was created on his behalf by Roney Mans, a trained medical scribe. The creation of this record is based on the scribe's personal observations and the provider's statements to them. This document has been checked and approved by the attending provider.

## 2021-07-24 NOTE — Telephone Encounter (Signed)
Spoke to patient to confirm appointment for 12/1 [facility] appointment

## 2021-07-25 ENCOUNTER — Inpatient Hospital Stay: Payer: PPO | Attending: Internal Medicine | Admitting: Internal Medicine

## 2021-07-25 ENCOUNTER — Ambulatory Visit
Admission: RE | Admit: 2021-07-25 | Discharge: 2021-07-25 | Disposition: A | Discharge status: Home / Self Care | Payer: PPO | Source: Ambulatory Visit | Attending: Radiation Oncology | Admitting: Radiation Oncology

## 2021-07-25 ENCOUNTER — Encounter: Payer: Self-pay | Admitting: *Deleted

## 2021-07-25 ENCOUNTER — Encounter: Payer: Self-pay | Admitting: Internal Medicine

## 2021-07-25 ENCOUNTER — Inpatient Hospital Stay: Payer: PPO

## 2021-07-25 ENCOUNTER — Encounter: Payer: Self-pay | Admitting: Emergency Medicine

## 2021-07-25 ENCOUNTER — Other Ambulatory Visit: Payer: Self-pay

## 2021-07-25 ENCOUNTER — Other Ambulatory Visit: Payer: Self-pay | Admitting: *Deleted

## 2021-07-25 VITALS — BP 152/63 | HR 76 | Temp 98.4°F | Resp 20 | Ht 61.0 in | Wt 173.2 lb

## 2021-07-25 VITALS — BP 152/63 | HR 76 | Temp 98.4°F | Resp 20 | Wt 173.2 lb

## 2021-07-25 DIAGNOSIS — C3490 Malignant neoplasm of unspecified part of unspecified bronchus or lung: Secondary | ICD-10-CM

## 2021-07-25 DIAGNOSIS — Z87891 Personal history of nicotine dependence: Secondary | ICD-10-CM | POA: Diagnosis not present

## 2021-07-25 DIAGNOSIS — E039 Hypothyroidism, unspecified: Secondary | ICD-10-CM | POA: Diagnosis not present

## 2021-07-25 DIAGNOSIS — C3432 Malignant neoplasm of lower lobe, left bronchus or lung: Secondary | ICD-10-CM | POA: Diagnosis not present

## 2021-07-25 DIAGNOSIS — Z510 Encounter for antineoplastic radiation therapy: Secondary | ICD-10-CM | POA: Insufficient documentation

## 2021-07-25 DIAGNOSIS — Z5111 Encounter for antineoplastic chemotherapy: Secondary | ICD-10-CM | POA: Insufficient documentation

## 2021-07-25 DIAGNOSIS — R590 Localized enlarged lymph nodes: Secondary | ICD-10-CM

## 2021-07-25 LAB — CMP (CANCER CENTER ONLY)
ALT: 12 U/L (ref 0–44)
AST: 15 U/L (ref 15–41)
Albumin: 3.8 g/dL (ref 3.5–5.0)
Alkaline Phosphatase: 118 U/L (ref 38–126)
Anion gap: 9 (ref 5–15)
BUN: 27 mg/dL — ABNORMAL HIGH (ref 8–23)
CO2: 25 mmol/L (ref 22–32)
Calcium: 8.9 mg/dL (ref 8.9–10.3)
Chloride: 106 mmol/L (ref 98–111)
Creatinine: 1.44 mg/dL — ABNORMAL HIGH (ref 0.44–1.00)
GFR, Estimated: 36 mL/min — ABNORMAL LOW (ref >60)
Glucose, Bld: 130 mg/dL — ABNORMAL HIGH (ref 70–99)
Potassium: 4.3 mmol/L (ref 3.5–5.1)
Sodium: 140 mmol/L (ref 135–145)
Total Bilirubin: <0.2 mg/dL — ABNORMAL LOW (ref 0.3–1.2)
Total Protein: 7.2 g/dL (ref 6.5–8.1)

## 2021-07-25 LAB — CBC WITH DIFFERENTIAL (CANCER CENTER ONLY)
Abs Immature Granulocytes: 0.02 10*3/uL (ref 0.00–0.07)
Basophils Absolute: 0.1 10*3/uL (ref 0.0–0.1)
Basophils Relative: 1 %
Eosinophils Absolute: 0.3 10*3/uL (ref 0.0–0.5)
Eosinophils Relative: 4 %
HCT: 34 % — ABNORMAL LOW (ref 36.0–46.0)
Hemoglobin: 10.6 g/dL — ABNORMAL LOW (ref 12.0–15.0)
Immature Granulocytes: 0 %
Lymphocytes Relative: 12 %
Lymphs Abs: 0.7 10*3/uL (ref 0.7–4.0)
MCH: 30.3 pg (ref 26.0–34.0)
MCHC: 31.2 g/dL (ref 30.0–36.0)
MCV: 97.1 fL (ref 80.0–100.0)
Monocytes Absolute: 0.7 10*3/uL (ref 0.1–1.0)
Monocytes Relative: 12 %
Neutro Abs: 4.1 10*3/uL (ref 1.7–7.7)
Neutrophils Relative %: 71 %
Platelet Count: 258 10*3/uL (ref 150–400)
RBC: 3.5 MIL/uL — ABNORMAL LOW (ref 3.87–5.11)
RDW: 13.3 % (ref 11.5–15.5)
WBC Count: 5.8 10*3/uL (ref 4.0–10.5)
nRBC: 0 % (ref 0.0–0.2)

## 2021-07-25 MED ORDER — PROCHLORPERAZINE MALEATE 10 MG PO TABS: 10.0000 mg | ORAL_TABLET | Freq: Four times a day (QID) | ORAL | 0 refills | Status: DC | PRN

## 2021-07-25 NOTE — Progress Notes (Signed)
[place] [phone]   [phone]  CONSULT NOTE  REFERRING PHYSICIAN: Dr. Baltazar Apo  REASON FOR CONSULTATION:  85 years old white female diagnosed with lung cancer.  HPI Abigail [name] is a 85 y.o. female with medical history significant for multiple medical problems including anemia, osteoarthritis, chronic kidney disease, carotid artery occlusion, coronary artery disease, hypertension, hypothyroidism, neuropathy, prediabetes, scoliosis as well as long history of smoking but quit in 1987.  The patient was complaining of increasing fatigue and weakness as well as shortness of breath in August 2022.  She had a chest x-ray performed on 04/15/2021 and that showed new 3.0 cm masslike opacity in the posterior mid to upper left lung.  This was followed by CT scan of the chest on the same day and it showed mass within the superior segment of the left lower lobe measuring 2.5 x 3.0 cm with no additional pulmonary mass/nodules.  A PET scan was performed on July 09, 2021 and that showed hypermetabolic 3.2 cm superior segment left lower lobe pulmonary mass consistent with primary lung neoplasm with associated left hilar and mediastinal metastatic adenopathy.  There was no findings for pulmonary metastatic disease, abdominal/pelvic metastatic disease or osseous metastatic disease.  The patient was referred to Dr. Lamonte Sakai and on July 15, 2021 she underwent video bronchoscopy with endobronchial ultrasound procedure. The final pathology ([id number]) showed the fine-needle aspiration from 4L and 10 L as well as a station 7 showed malignant cells consistent with adenocarcinoma. The malignant cells are positive with TTF-1 and negative with cytokeratin 5/6, p63, CD56, synaptophysin and chromogranin. Dr. Lamonte Sakai kindly referred the patient to me today for evaluation and recommendation regarding treatment of his condition. When seen today the patient continues to complain  of feeling tired most of the time.  She denied having any chest pain, shortness of breath but has cough productive of clear sputum and no hemoptysis.  She denied having any nausea, vomiting, diarrhea but has intermittent constipation and she using laxative. She has no recent weight loss or night sweats.  She has no headache or visual changes. Family history significant for father with bone cancer.  Mother had hypothyroidism, diabetes mellitus and dementia.  Daughter had breast cancer and melanoma. The patient is single and has 2 children a son and daughter.  She was accompanied today by her son Nicole Kindred.  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  She has a history for smoking for more than 20 years and quit in 1987.  She has no history of alcohol or drug abuse.   HPI  Past Medical History:  Diagnosis Date   Anemia    Arthritis    knees, back   Carotid artery occlusion    Chronic kidney disease    stage 3 ckd no nephrologist   Complete uterine prolapse with prolapse of anterior vaginal wall    Complication of anesthesia    hard to wake up per pt   Constipation    Coronary artery disease    Diverticulitis yrs ago coialitis   Dyspnea    History of blood transfusion    Hypertension    Hypothyroid    Numbness    in hands at times   Pneumonia    Pre-diabetes    Scoliosis    STEMI (ST elevation myocardial infarction) ([place]) 10/26/2019   DES RCA   Wears dentures    full dentures   Wears glasses  for reading    Past Surgical History:  Procedure Laterality Date   ANTERIOR AND POSTERIOR REPAIR WITH SACROSPINOUS FIXATION N/A 12/20/2020   Procedure: SACROSPINOUS LIGAMENT FIXATION;  Surgeon: [name], MD;  Location: [facility]  Service: Gynecology;  Laterality: N/A;  Total time requested for all procedures is 2 hours   [place] age 66   lower   bartholin cyst removal  age 63's   BLADDER SUSPENSION N/A 12/20/2020    Procedure: TRANSVAGINAL TAPE (TVT) PROCEDURE;  Surgeon: [name], MD;  Location: [facility];  Service: Gynecology;  Laterality: N/A;   BRONCHIAL BRUSHINGS  07/15/2021   Procedure: BRONCHIAL BRUSHINGS;  Surgeon: Collene Gobble, MD;  Location: [facility] ENDOSCOPY;  Service: Pulmonary;;   BRONCHIAL NEEDLE ASPIRATION BIOPSY  07/15/2021   Procedure: BRONCHIAL NEEDLE ASPIRATION BIOPSIES;  Surgeon: Collene Gobble, MD;  Location: [place] ENDOSCOPY;  Service: Pulmonary;;   CORONARY/GRAFT ACUTE MI REVASCULARIZATION N/A 10/26/2019   Procedure: Coronary/Graft Acute MI Revascularization;  Surgeon: Sherren Mocha, MD;  Location: [place] CV LAB;  Service: Cardiovascular;  Laterality: N/A;   CYSTOCELE REPAIR N/A 12/20/2020   Procedure: ANTERIOR AND POSTERIOR REPAIR WITH PERINEORRHAPHY;  Surgeon: [name], MD;  Location: [facility];  Service: Gynecology;  Laterality: N/A;   CYSTOSCOPY N/A 12/20/2020   Procedure: CYSTOSCOPY;  Surgeon: [name], MD;  Location: [facility];  Service: Gynecology;  Laterality: N/A;   ELBOW SURGERY  1990's   left   ENDOBRONCHIAL ULTRASOUND N/A 07/15/2021   Procedure: ENDOBRONCHIAL ULTRASOUND;  Surgeon: Collene Gobble, MD;  Location: [facility] ENDOSCOPY;  Service: Pulmonary;  Laterality: N/A;   HEMOSTASIS CONTROL  07/15/2021   Procedure: HEMOSTASIS CONTROL;  Surgeon: Collene Gobble, MD;  Location: [facility] ENDOSCOPY;  Service: Pulmonary;;   LEFT HEART CATH AND CORONARY ANGIOGRAPHY N/A 10/26/2019   Procedure: LEFT HEART CATH AND CORONARY ANGIOGRAPHY;  Surgeon: Sherren Mocha, MD;  Location: [place] CV LAB;  Service: Cardiovascular;  Laterality: N/A;   TONSILLECTOMY      Family History  Problem Relation Age of Onset   Hypertension Mother     Social History Social History   Tobacco Use   Smoking status: Former    Packs/day: 1.50    Years: 35.00    Pack years: 52.50    Types: Cigarettes    Quit date:  04/05/1986    Years since quitting: 35.3   Smokeless tobacco: Never  Vaping Use   Vaping Use: Never used  Substance Use Topics   Alcohol use: No   Drug use: No    Allergies  Allergen Reactions   Codeine Nausea And Vomiting   Norvasc [Amlodipine] Rash and Other (See Comments)    rash    Current Outpatient Medications  Medication Sig Dispense Refill   acetaminophen (TYLENOL) 325 MG tablet Take 2 tablets (650 mg total) by mouth every 6 (six) hours as needed for mild pain (or Fever >/= 101).     aspirin EC 81 MG tablet Take 81 mg by mouth at bedtime.     atorvastatin (LIPITOR) 80 MG tablet Take 1 tablet (80 mg total) by mouth daily at 6 PM. 90 tablet 3   Cholecalciferol (VITAMIN D3 PO) Take 2 capsules by mouth daily.     Cranberry 1000 MG CAPS Take 1,000 mg by mouth daily at 12 noon.     cyclobenzaprine (FLEXERIL) 10 MG tablet Take 1 tablet (10 mg total) by mouth 3 (  three) times daily as needed for muscle spasms. 30 tablet 1   docusate sodium (COLACE) 100 MG capsule Take 100-200 mg by mouth See admin instructions. Take 100 mg in the morning and 200 mg at bedtime     estradiol (ESTRACE) 0.1 MG/GM vaginal cream Place 0.5 g vaginally once a week. Place 0.5g twice a week at opening of vagina     ferrous sulfate 300 (60 Fe) MG/5ML syrup Take 5 mLs (300 mg total) by mouth 2 (two) times daily with a meal. 300 mL 11   levothyroxine (SYNTHROID) 175 MCG tablet TAKE 1 TABLET BY MOUTH EVERY MORNING ON MONDAY - FRIDAY 90 tablet 1   lisinopril-hydrochlorothiazide (ZESTORETIC) 20-25 MG tablet Take 0.5 tablets by mouth 3 (three) times a week. Take on Monday Wednesday and Friday only 90 tablet 1   nitroGLYCERIN (NITROSTAT) 0.4 MG SL tablet Place 1 tablet (0.4 mg total) under the tongue every 5 (five) minutes x 3 doses as needed for chest pain. 25 tablet 3   sulfamethoxazole-trimethoprim (BACTRIM) 400-80 MG tablet Take 0.5 tablets by mouth daily. 30 tablet 5   Trospium Chloride 60 MG CP24 TAKE 1 CAPSULE BY  MOUTH EVERY DAY 30 capsule 5   No current facility-administered medications for this visit.    Review of Systems  Constitutional: positive for fatigue Eyes: negative Ears, nose, mouth, throat, and face: negative Respiratory: positive for cough and sputum Cardiovascular: negative Gastrointestinal: negative Genitourinary:negative Integument/breast: negative Hematologic/lymphatic: negative Musculoskeletal:negative Neurological: negative Behavioral/Psych: negative Endocrine: negative Allergic/Immunologic: negative  Physical Exam  ZWC:HENID, healthy, no distress, well nourished, and well developed SKIN: skin color, texture, turgor are normal, no rashes or significant lesions HEAD: Normocephalic, No masses, lesions, tenderness or abnormalities EYES: normal, PERRLA, Conjunctiva are pink and non-injected EARS: External ears normal, Canals clear OROPHARYNX:no exudate, no erythema, and lips, buccal mucosa, and tongue normal  NECK: supple, no adenopathy, no JVD LYMPH:  no palpable lymphadenopathy, no hepatosplenomegaly BREAST:not examined LUNGS: clear to auscultation , and palpation HEART: regular rate & rhythm, no murmurs, and no gallops ABDOMEN:abdomen soft, non-tender, normal bowel sounds, and no masses or organomegaly BACK: Back symmetric, no curvature., No CVA tenderness EXTREMITIES:no joint deformities, effusion, or inflammation, no edema  NEURO: alert & oriented x 3 with fluent speech, no focal motor/sensory deficits  PERFORMANCE STATUS: ECOG 1  LABORATORY DATA: Lab Results  Component Value Date   WBC 5.8 07/25/2021   HGB 10.6 (L) 07/25/2021   HCT 34.0 (L) 07/25/2021   MCV 97.1 07/25/2021   PLT 258 07/25/2021      Chemistry      Component Value Date/Time   NA 137 07/10/2021 1118   K 5.1 07/10/2021 1118   CL 101 07/10/2021 1118   CO2 24 07/10/2021 1118   BUN 23 07/10/2021 1118   CREATININE 1.33 (H) 07/10/2021 1118   CREATININE 1.08 04/18/2014 1516   GLU 104  12/23/2017 1111      Component Value Date/Time   CALCIUM 9.5 07/10/2021 1118   ALKPHOS 111 07/10/2021 1118   AST 16 07/10/2021 1118   ALT 12 07/10/2021 1118   BILITOT 0.2 07/10/2021 1118       RADIOGRAPHIC STUDIES: NM PET Image Initial (PI) Skull Base To Thigh  Result Date: 07/09/2021 CLINICAL DATA:  Initial treatment strategy for left lower lobe lung mass. EXAM: NUCLEAR MEDICINE PET SKULL BASE TO THIGH TECHNIQUE: 8.60 mCi F-18 FDG was injected intravenously. Full-ring PET imaging was performed from the skull base to thigh after the radiotracer. CT data  was obtained and used for attenuation correction and anatomic localization. Fasting blood glucose: 129 mg/dl COMPARISON:  Chest CT 04/15/2021 FINDINGS: Mediastinal blood pool activity: SUV max 2.35 Liver activity: SUV max NA NECK: No hypermetabolic lymph nodes in the neck. Incidental CT findings: Bilateral carotid artery calcifications. CHEST: The superior segment left lower lobe lung mass is hypermetabolic with SUV max of 24.0. Cyst the aided left hilar lymphadenopathy the as demonstrated on the prior CT scan. SUV max is 20.31. There is also AP window and subcarinal. No metastatic pulmonary nodules are identified. No enlarged or hypermetabolic axillary or supraclavicular nodes. No breast masses. Incidental CT findings: Stable advanced atherosclerotic calcifications involving the aorta and coronary arteries. Large hiatal hernia. ABDOMEN/PELVIS: No findings suspicious for abdominal/pelvic metastatic disease. No hepatic or adrenal gland lesions. No metastatic adenopathy. Incidental CT findings: Advanced aortic calcifications but no aneurysm. Bilateral renal cysts. There is a slightly hyperdense lesion involving the midpole region of the left kidney which is an indeterminate finding but likely a hemorrhagic or proteinaceous cyst. Attention on follow-up scans is suggested. For pole left renal calculus. Gas in the bladder likely recent catheterization.  SKELETON: No findings suspicious for osseous metastatic disease. Incidental CT findings: none IMPRESSION: 1. Hypermetabolic 3.2 cm superior segment left lower lobe pulmonary mass consistent with primary lung neoplasm. Associated left hilar and mediastinal metastatic adenopathy. 2. No findings for pulmonary metastatic disease, abdominal/pelvic metastatic disease or osseous metastatic disease. 3. Stable advanced vascular disease and emphysema. Electronically Signed   By: Marijo Sanes M.D.   On: 07/09/2021 16:36   NM PET SUPER D CT  Result Date: 07/09/2021 CLINICAL DATA:  Preop for EMB and biopsy. EXAM: CT CHEST WITHOUT CONTRAST TECHNIQUE: Multidetector CT imaging of the chest was performed using thin slice collimation for electromagnetic bronchoscopy planning purposes, without intravenous contrast. COMPARISON:  PET CT, same day and prior CT scan 04/15/2021 FINDINGS: Cardiovascular: The heart is normal in size. No pericardial effusion. Stable advanced atherosclerotic calcifications involving the thoracic aorta and branch vessels including three-vessel coronary artery calcifications. Mediastinum/Nodes: Left hilar and subcarinal adenopathy the. The esophagus is grossly normal. There is a large hiatal hernia. Lungs/Pleura: The 3.2 cm superior segment left lower lobe mass was hypermetabolic on the PET-CT consistent with primary lung neoplasm. Stable underlying emphysematous changes. No metastatic pulmonary nodules are identified. There is a small left pleural effusion and patchy overlying atelectasis. Upper Abdomen: No significant upper abdominal findings. Advanced aortic calcifications. Musculoskeletal: No breast masses, supraclavicular or axillary adenopathy. The bony thorax is intact. Severe degenerative changes involving the thoracic spine. IMPRESSION: 1. 3.2 cm superior segment left lower lobe lung mass consistent with primary lung neoplasm. 2. Left hilar and subcarinal adenopathy. 3. No metastatic pulmonary  nodules. 4. Small left pleural effusion and patchy overlying atelectasis. 5. Large hiatal hernia. 6. Stable advanced atherosclerotic calcifications and emphysema. Aortic Atherosclerosis (ICD10-I70.0) and Emphysema (ICD10-J43.9). The Electronically Signed   By: Marijo Sanes M.D.   On: 07/09/2021 16:40    ASSESSMENT: This is a very pleasant 85 years old white female recently diagnosed with stage IIIa (T2a, N2, M0) non-small cell lung cancer, adenocarcinoma presented with left lower lobe lung mass in addition to left hilar and mediastinal lymphadenopathy diagnosed in November 2022 with no actionable mutation on the guardant blood test   PLAN: I had a lengthy discussion with the patient and her son today about her current disease stage, prognosis and treatment options.  I personally and independently reviewed the scan images and discussed the results  with the patient and her son. I recommended for her to complete the staging work-up by ordering MRI of the brain to rule out brain metastasis. I recommended for the patient treatment with a course of concurrent chemoradiation with weekly carboplatin for AUC of 2 and paclitaxel 45 Mg/M2.  This will be followed by consolidation treatment with immunotherapy if the patient has no evidence for disease progression after the induction phase. I discussed with the patient the adverse effect of this treatment including but not limited to alopecia, myelosuppression, nausea and vomiting, peripheral neuropathy, liver or renal dysfunction. She was seen by Dr. Sondra Come earlier today for discussion of the radiotherapy option. The patient is expected to start the first dose of her treatment on August 05, 2021. She will have a chemotherapy education class before the first dose of her treatment. I will call her pharmacy with prescription for Compazine 10 mg p.o. every 6 hours as needed for nausea. The patient will come back for follow-up visit 1 week after the start of her  treatment for management of any adverse effect of her treatment. She was advised to call immediately if she has any other concerning symptoms in the interval. The patient voices understanding of current disease status and treatment options and is in agreement with the current care plan.  All questions were answered. The patient knows to call the clinic with any problems, questions or concerns. We can certainly see the patient much sooner if necessary.  Thank you so much for allowing me to participate in the care of [name]. I will continue to follow up the patient with you and assist in her care.   The total time spent in the appointment was 90 minutes.  Disclaimer: This note was dictated with voice recognition software. Similar sounding words can inadvertently be transcribed and may not be corrected upon review.   Eilleen Kempf July 25, 2021, 2:06 PM

## 2021-07-25 NOTE — Progress Notes (Signed)
The proposed treatment discussed in cancer conference is for discussion purpose only and is not a binding recommendation. The patient was not physically examined nor present for their treatment options. Therefore, final treatment plans cannot be decided.  ?

## 2021-07-25 NOTE — Progress Notes (Signed)
START ON PATHWAY REGIMEN - Non-Small Cell Lung     Administer weekly:     Paclitaxel      Carboplatin   **Always confirm dose/schedule in your pharmacy ordering system**  Patient Characteristics: Preoperative or Nonsurgical Candidate (Clinical Staging), Stage III - Nonsurgical Candidate (Nonsquamous and Squamous), PS = 0, 1 Therapeutic Status: Preoperative or Nonsurgical Candidate (Clinical Staging) AJCC T Category: cT2a AJCC N Category: cN2 AJCC M Category: cM0 AJCC 8 Stage Grouping: IIIA ECOG Performance Status: 1 Intent of Therapy: Curative Intent, Discussed with Patient

## 2021-07-25 NOTE — Research (Signed)
[phone] - Procurement of Human Biospecimens for the Discovery and Validation of Biomarkers for the Prediction, Diagnosis, and Management of Disease  07/25/21  Patient Abigail [name] was identified by this research coordinator as a potential candidate for the above listed study.  This Clinical Research Coordinator met with Kenny K Gantt, [id number], on 07/25/21 in a manner and location that ensures patient privacy to discuss participation in the above listed research study.  Patient is Accompanied by her family member .  A copy of the informed consent document and separate HIPAA Authorization was provided to the patient.  Patient reads, speaks, and understands English.   Patient was provided with the business card of this Coordinator and encouraged to contact the research team with any questions.  Approximately 10 minutes were spent with the patient reviewing the informed consent documents.  Patient was provided the option of taking informed consent documents home to review and was encouraged to review at their convenience with their support network, including other care providers. Patient took the consent documents home to review.  Plan:  Will follow up with the patient via phone prior to her next appointment to determine her interest in participating and go over next steps.   Kory Isley Clinical Research Coordinator I  07/25/21 3:13 PM  

## 2021-07-26 ENCOUNTER — Telehealth: Payer: Self-pay | Admitting: Internal Medicine

## 2021-07-26 ENCOUNTER — Encounter: Payer: Self-pay | Admitting: *Deleted

## 2021-07-26 NOTE — Telephone Encounter (Signed)
Sch per 12/1 los, pt aware.

## 2021-07-26 NOTE — Progress Notes (Signed)
Oncology Nurse Navigator Documentation  Oncology Nurse Navigator Flowsheets 07/26/2021 07/19/2021  Abnormal Finding Date - 04/15/2021  Confirmed Diagnosis Date - 07/15/2021  Diagnosis Status Confirmed Diagnosis Complete -  Planned Course of Treatment Chemo/Radiation Concurrent -  Phase of Treatment Radiation -  Navigator Follow Up Date: 07/30/2021 07/25/2021  Navigator Follow Up Reason: Appointment Review New Patient Appointment  Navigator Location [facility]  Navigator Encounter Type Other:I followed up on patient's treatment plan schedule. She is set up for her MRI brain on 12/4 and SIM on 12/5.  Chemo schedule is still pending at this time.  Telephone  Telephone - Appt Confirmation/Clarification  Multidisiplinary Clinic Date - 07/25/2021  Multidisiplinary Clinic Type - Thoracic  Patient Visit Type - Other  Treatment Phase - Pre-Tx/Tx Discussion  Barriers/Navigation Needs Coordination of Care Coordination of Care;Education  Education - Other  Interventions Coordination of Care Coordination of Care;Education;Psycho-Social Support  Acuity Level 2-Minimal Needs (1-2 Barriers Identified) Level 2-Minimal Needs (1-2 Barriers Identified)  Coordination of Care - Appts  Education Method - Verbal  Time Spent with Patient 30 30

## 2021-07-26 NOTE — Telephone Encounter (Signed)
Spoke to patient to confirm upcoming MRI appointment for Sunday 12/4, patient understood location and time

## 2021-07-28 ENCOUNTER — Ambulatory Visit (HOSPITAL_COMMUNITY)
Admission: RE | Admit: 2021-07-28 | Discharge: 2021-07-28 | Disposition: A | Discharge status: Home / Self Care | Payer: PPO | Source: Ambulatory Visit | Attending: Internal Medicine | Admitting: Internal Medicine

## 2021-07-28 DIAGNOSIS — C3490 Malignant neoplasm of unspecified part of unspecified bronchus or lung: Secondary | ICD-10-CM | POA: Diagnosis not present

## 2021-07-28 MED ORDER — GADOBUTROL 1 MMOL/ML IV SOLN
8.0000 mL | Freq: Once | INTRAVENOUS | Status: AC | PRN
  Administered 2021-07-28: 14:00:00 8 mL via INTRAVENOUS

## 2021-07-29 ENCOUNTER — Ambulatory Visit
Admission: RE | Admit: 2021-07-29 | Discharge: 2021-07-29 | Disposition: A | Discharge status: Home / Self Care | Payer: PPO | Source: Ambulatory Visit | Attending: Radiation Oncology | Admitting: Radiation Oncology

## 2021-07-29 ENCOUNTER — Other Ambulatory Visit: Payer: Self-pay

## 2021-07-29 ENCOUNTER — Telehealth: Payer: Self-pay | Admitting: Emergency Medicine

## 2021-07-29 DIAGNOSIS — C3490 Malignant neoplasm of unspecified part of unspecified bronchus or lung: Secondary | ICD-10-CM

## 2021-07-29 DIAGNOSIS — C3432 Malignant neoplasm of lower lobe, left bronchus or lung: Secondary | ICD-10-CM | POA: Insufficient documentation

## 2021-07-29 DIAGNOSIS — Z5111 Encounter for antineoplastic chemotherapy: Secondary | ICD-10-CM | POA: Diagnosis not present

## 2021-07-29 DIAGNOSIS — Z510 Encounter for antineoplastic radiation therapy: Secondary | ICD-10-CM | POA: Insufficient documentation

## 2021-07-29 NOTE — Telephone Encounter (Signed)
[facility] [phone] - Customer service manager for the Discovery and Validation of Biomarkers for the Prediction, Diagnosis, and Management of Disease  07/29/21  Called to follow up with patient concerning this study.  She states she has not had time to consider participation, but states she is willing to speak with this coordinator again in person on Wednesday 12/7 after her chemo education appointment.    Will follow up with patient on Wednesday 12/7 to review consent documents.  The patient was thanked for her time.  [name] Clinical Research Coordinator I  07/29/21 11:13 AM

## 2021-07-29 NOTE — Progress Notes (Signed)
Pharmacist Chemotherapy Monitoring - Initial Assessment    Anticipated start date: 08/05/21   The following has been reviewed per standard work regarding the patient's treatment regimen: The patient's diagnosis, treatment plan and drug doses, and organ/hematologic function Lab orders and baseline tests specific to treatment regimen  The treatment plan start date, drug sequencing, and pre-medications Prior authorization status  Patient's documented medication list, including drug-drug interaction screen and prescriptions for anti-emetics and supportive care specific to the treatment regimen The drug concentrations, fluid compatibility, administration routes, and timing of the medications to be used The patient's access for treatment and lifetime cumulative dose history, if applicable  The patient's medication allergies and previous infusion related reactions, if applicable   Changes made to treatment plan:  N/A  Follow up needed:  N/A   Philomena Course, RPH, 07/29/2021  11:11 AM

## 2021-07-31 ENCOUNTER — Encounter: Payer: Self-pay | Admitting: Internal Medicine

## 2021-07-31 ENCOUNTER — Ambulatory Visit: Payer: PPO | Admitting: Internal Medicine

## 2021-07-31 ENCOUNTER — Inpatient Hospital Stay (HOSPITAL_BASED_OUTPATIENT_CLINIC_OR_DEPARTMENT_OTHER): Payer: PPO

## 2021-07-31 ENCOUNTER — Inpatient Hospital Stay: Payer: PPO | Admitting: Emergency Medicine

## 2021-07-31 ENCOUNTER — Other Ambulatory Visit: Payer: Self-pay

## 2021-07-31 VITALS — BP 136/64 | HR 72 | Ht 66.0 in | Wt 173.0 lb

## 2021-07-31 DIAGNOSIS — I25118 Atherosclerotic heart disease of native coronary artery with other forms of angina pectoris: Secondary | ICD-10-CM

## 2021-07-31 DIAGNOSIS — Z5111 Encounter for antineoplastic chemotherapy: Secondary | ICD-10-CM | POA: Diagnosis not present

## 2021-07-31 DIAGNOSIS — C3490 Malignant neoplasm of unspecified part of unspecified bronchus or lung: Secondary | ICD-10-CM

## 2021-07-31 DIAGNOSIS — C3432 Malignant neoplasm of lower lobe, left bronchus or lung: Secondary | ICD-10-CM | POA: Diagnosis not present

## 2021-07-31 DIAGNOSIS — Z85118 Personal history of other malignant neoplasm of bronchus and lung: Secondary | ICD-10-CM | POA: Diagnosis not present

## 2021-07-31 MED ORDER — EZETIMIBE 10 MG PO TABS: 10.0000 mg | ORAL_TABLET | Freq: Every day | ORAL | 3 refills | Status: DC

## 2021-07-31 NOTE — Research (Signed)
[facility] [phone] - Customer service manager for the Discovery and Validation of Biomarkers for the Prediction, Diagnosis, and Management of Disease  07/31/21  Patient is in the clinic today for chemo education.  Discussed this study with the patient again and reviewed the consent documents.  Answered patient's questions regarding privacy and use of health information for this research study.  The patient states she has concerns regarding sharing her health information and has decided not to participate in this study.  [name] Clinical Research Coordinator I  07/31/21 11:48 AM

## 2021-07-31 NOTE — Progress Notes (Signed)
Met with patient at registration to introduce myself as Arboriculturist and to offer available resources.  Discussed one-time $1000 Radio broadcast assistant to assist with personal expenses while going through treatment. Advised what is needed to apply. She states she can bring on 12/12/ at next visit.  Gave her my card for any additional financial questions or concerns.

## 2021-07-31 NOTE — Progress Notes (Signed)
Cardiology Office Note:    Date:  07/31/2021   ID:  Abigail [name], DOB Jun 03, 1936, MRN [id number]  PCP:  [name], FNP   [facility] Providers Cardiologist:  Larae Grooms, MD     Referring MD: [name], FNP   CC: DOD New malignancy.  History of Present Illness:    Abigail [name] is a 85 y.o. female with a hx CAD, CKD Stage IIIa, Obstructive CAD (RCA) with moderate non-obstructive LAD and Lcx with PCI to prox RCA.  Prior LHC was deferred in the setting significant anemia.  Patient seen in 6/22 with transition to plavix. Seen 04/11/21.  In interim of this visit, patient ED visit for bleed.  Seen 07/31/21.  In interim of this visit, patient has also been diagnosed with new lung adenocarcinoma. 07/31/21  Patient notes that she is exhausted.  Was at the heart center.  Her dad died of bone cancer, her daughter had two cancers.  She is worried about the next steps and she is very tired from everything.   She is planned for Carboplatin and paclitaxel (cardiotoxic chemotherapy). She is planned for high dose radiation.  No chest pain or pressure.  No SOB/DOE and no PND/Orthopnea.  No weight gain or leg swelling.  No palpitations or syncope .   Past Medical History:  Diagnosis Date   Anemia    Arthritis    knees, back   Carotid artery occlusion    Chronic kidney disease    stage 3 ckd no nephrologist   Complete uterine prolapse with prolapse of anterior vaginal wall    Complication of anesthesia    hard to wake up per pt   Constipation    Coronary artery disease    Diverticulitis yrs ago coialitis   Dyspnea    History of blood transfusion    Hypertension    Hypothyroid    Numbness    in hands at times   Pneumonia    Pre-diabetes    Scoliosis    STEMI (ST elevation myocardial infarction) ([place]) 10/26/2019   DES RCA   Wears dentures    full dentures   Wears glasses    for reading    Past Surgical History:  Procedure Laterality Date   ANTERIOR AND POSTERIOR  REPAIR WITH SACROSPINOUS FIXATION N/A 12/20/2020   Procedure: SACROSPINOUS LIGAMENT FIXATION;  Surgeon: [name], MD;  Location: [facility];  Service: Gynecology;  Laterality: N/A;  Total time requested for all procedures is 2 hours   [place] age 3   lower   bartholin cyst removal  age 54's   BLADDER SUSPENSION N/A 12/20/2020   Procedure: TRANSVAGINAL TAPE (TVT) PROCEDURE;  Surgeon: [name], MD;  Location: [facility];  Service: Gynecology;  Laterality: N/A;   BRONCHIAL BRUSHINGS  07/15/2021   Procedure: BRONCHIAL BRUSHINGS;  Surgeon: Collene Gobble, MD;  Location: [facility] ENDOSCOPY;  Service: Pulmonary;;   BRONCHIAL NEEDLE ASPIRATION BIOPSY  07/15/2021   Procedure: BRONCHIAL NEEDLE ASPIRATION BIOPSIES;  Surgeon: Collene Gobble, MD;  Location: [place] ENDOSCOPY;  Service: Pulmonary;;   CORONARY/GRAFT ACUTE MI REVASCULARIZATION N/A 10/26/2019   Procedure: Coronary/Graft Acute MI Revascularization;  Surgeon: Sherren Mocha, MD;  Location: [place] CV LAB;  Service: Cardiovascular;  Laterality: N/A;   CYSTOCELE REPAIR N/A 12/20/2020   Procedure: ANTERIOR AND POSTERIOR REPAIR WITH PERINEORRHAPHY;  Surgeon: [name], MD;  Location: [facility];  Service: Gynecology;  Laterality: N/A;  CYSTOSCOPY N/A 12/20/2020   Procedure: CYSTOSCOPY;  Surgeon: [name], MD;  Location: [facility];  Service: Gynecology;  Laterality: N/A;   ELBOW SURGERY  1990's   left   ENDOBRONCHIAL ULTRASOUND N/A 07/15/2021   Procedure: ENDOBRONCHIAL ULTRASOUND;  Surgeon: Collene Gobble, MD;  Location: [facility] ENDOSCOPY;  Service: Pulmonary;  Laterality: N/A;   HEMOSTASIS CONTROL  07/15/2021   Procedure: HEMOSTASIS CONTROL;  Surgeon: Collene Gobble, MD;  Location: [facility] ENDOSCOPY;  Service: Pulmonary;;   LEFT HEART CATH AND CORONARY ANGIOGRAPHY N/A 10/26/2019   Procedure: LEFT HEART CATH AND CORONARY ANGIOGRAPHY;   Surgeon: Sherren Mocha, MD;  Location: [place] CV LAB;  Service: Cardiovascular;  Laterality: N/A;   TONSILLECTOMY      Current Medications: Current Meds  Medication Sig   acetaminophen (TYLENOL) 325 MG tablet Take 2 tablets (650 mg total) by mouth every 6 (six) hours as needed for mild pain (or Fever >/= 101).   aspirin EC 81 MG tablet Take 81 mg by mouth at bedtime.   atorvastatin (LIPITOR) 80 MG tablet Take 1 tablet (80 mg total) by mouth daily at 6 PM.   Cholecalciferol (VITAMIN D3 PO) Take 2 capsules by mouth daily.   Cranberry 1000 MG CAPS Take 1,000 mg by mouth daily at 12 noon.   cyclobenzaprine (FLEXERIL) 10 MG tablet Take 1 tablet (10 mg total) by mouth 3 (three) times daily as needed for muscle spasms.   docusate sodium (COLACE) 100 MG capsule Take 100-200 mg by mouth See admin instructions. Take 100 mg in the morning and 200 mg at bedtime   estradiol (ESTRACE) 0.1 MG/GM vaginal cream Place 0.5 g vaginally once a week. Place 0.5g twice a week at opening of vagina   ezetimibe (ZETIA) 10 MG tablet Take 1 tablet (10 mg total) by mouth daily.   ferrous sulfate 300 (60 Fe) MG/5ML syrup Take 5 mLs (300 mg total) by mouth 2 (two) times daily with a meal.   levothyroxine (SYNTHROID) 175 MCG tablet TAKE 1 TABLET BY MOUTH EVERY MORNING ON MONDAY - FRIDAY   lisinopril-hydrochlorothiazide (ZESTORETIC) 20-25 MG tablet Take 0.5 tablets by mouth 3 (three) times a week. Take on Monday Wednesday and Friday only   nitroGLYCERIN (NITROSTAT) 0.4 MG SL tablet Place 1 tablet (0.4 mg total) under the tongue every 5 (five) minutes x 3 doses as needed for chest pain.   prochlorperazine (COMPAZINE) 10 MG tablet Take 1 tablet (10 mg total) by mouth every 6 (six) hours as needed for nausea or vomiting.   sulfamethoxazole-trimethoprim (BACTRIM) 400-80 MG tablet Take 0.5 tablets by mouth daily.   Trospium Chloride 60 MG CP24 TAKE 1 CAPSULE BY MOUTH EVERY DAY     Allergies:   Codeine and Norvasc  [amlodipine]   Social History   Socioeconomic History   Marital status: Widowed    Spouse name: Not on file   Number of children: Not on file   Years of education: Not on file   Highest education level: Not on file  Occupational History   Occupation: semi retired  Tobacco Use   Smoking status: Former    Packs/day: 1.50    Years: 35.00    Pack years: 52.50    Types: Cigarettes    Quit date: 04/05/1986    Years since quitting: 35.3   Smokeless tobacco: Never  Vaping Use   Vaping Use: Never used  Substance and Sexual Activity   Alcohol use: No   Drug use: No  Sexual activity: Not Currently    Birth control/protection: Post-menopausal  Other Topics Concern   Not on file  Social History Narrative   Not on file   Social Determinants of Health   Financial Resource Strain: Low Risk    Difficulty of Paying Living Expenses: Not hard at all  Food Insecurity: No Food Insecurity   Worried About Charity fundraiser in the Last Year: Never true   [place] in the Last Year: Never true  Transportation Needs: No Transportation Needs   Lack of Transportation (Medical): No   Lack of Transportation (Non-Medical): No  Physical Activity: Inactive   Days of Exercise per Week: 0 days   Minutes of Exercise per Session: 0 min  Stress: No Stress Concern Present   Feeling of Stress : Not at all  Social Connections: Not on file    Social: Has a daughter, son, anda  great granddaughter in [place]  Family History: The patient's family history includes Hypertension in her mother.  ROS:   Please see the history of present illness.     All other systems reviewed and are negative.  EKGs/Labs/Other Studies Reviewed:    The following studies were reviewed today:  EKG:   04/11/21: SR rate 76  new RBBB 11/09/20: SR rate 77 inferior TWI   Transthoracic Echocardiogram: Date: 05/03/21 Results: 1. Left ventricular ejection fraction, by estimation, is 65 to 70%. Left  ventricular  ejection fraction by PLAX is 68 %. The left ventricle has  normal function. The left ventricle has grossly normal regional wall  motion, however, endocardial definition  was limited and Definity was not used. Left ventricular diastolic  parameters are consistent with Grade I diastolic dysfunction (impaired  relaxation).   2. Right ventricular systolic function is normal. The right ventricular  size is not well visualized.   3. Left atrial size was mildly dilated.   4. The mitral valve is grossly normal. No evidence of mitral valve  regurgitation.   5. The aortic valve was not well visualized. Aortic valve regurgitation  is not visualized.   6. The inferior vena cava is normal in size with greater than 50%  respiratory variability, suggesting right atrial pressure of 3 mmHg.   Left/Right Heart Catheterizations: Date: 10/26/2019 Results: A drug-eluting stent was successfully placed using a SYNERGY XD 2.75X24. Post intervention, there is a 0% residual stenosis.   1.  Severe single-vessel coronary artery disease with a proximal RCA culprit lesion, treated successfully with primary PCI using a 2.75 x 24 mm Synergy DES 2.  Mild diffuse nonobstructive disease involving the LAD and left circumflex without any significant stenoses 3.  Normal LVEDP 4.  Moderately severe diffuse residual stenosis in the mid and distal RCA, favor initial medical therapy.   Recent Labs: 01/01/2021: TSH 0.486 07/25/2021: ALT 12; BUN 27; Creatinine 1.44; Hemoglobin 10.6; Platelet Count 258; Potassium 4.3; Sodium 140  Recent Lipid Panel    Component Value Date/Time   CHOL 152 07/10/2021 1118   TRIG 134 07/10/2021 1118   HDL 44 07/10/2021 1118   CHOLHDL 3.5 07/10/2021 1118   CHOLHDL 4.8 10/26/2019 2246   VLDL 40 10/26/2019 2246   LDLCALC 84 07/10/2021 1118    Physical Exam:    VS:  BP 136/64   Pulse 72   Ht 5\' 6"  (1.676 m)   Wt 173 lb (78.5 kg)   SpO2 95%   BMI 27.92 kg/m     Wt Readings from Last 3  Encounters:  07/31/21 173 lb (78.5 kg)  07/25/21 173 lb 3.2 oz (78.6 kg)  07/25/21 173 lb 3.2 oz (78.6 kg)     Gen: no distress Neck: No JVD Cardiac: No Rubs or Gallops, no murmur, regular rate, +2 Respiratory: Clear to auscultation bilaterally, normal effort, normal  respiratory rate GI: Soft, nontender, non-distended  MS: No  edema;  moves all extremities Integument: Skin feels warm Neuro:  At time of evaluation, alert and oriented to person/place/time/situation  Psych: Sad Affect   ASSESSMENT:    1. History of lung cancer   2. Malignant neoplasm of unspecified part of unspecified bronchus or lung ([place])   3. Coronary artery disease involving native coronary artery of native heart with other form of angina pectoris (HCC)     PLAN:    CAD with with resolved angina Lung cancer planned for high dose radiation and cardiotoxic chemotherapy RBBB Anemia HTN and HLD - continue ASA and statin 80 - stable hgb since first DOD eval - BP controled on ACEI and HCTZ - add zetia 10 mg PO daily - if low strain imaging or LV dysfunction add low dose coreg - will get echo in 5 months - will see [place] in 6 months  Time Spent Directly with Patient:   I have spent a total of 40 minutes with the patient reviewing notes, imaging, EKGs, labs and examining the patient as well as establishing an assessment and plan that was discussed personally with the patient- also discussing cardio-oncology.  > 50% of time was spent in direct patient care.    Medication Adjustments/Labs and Tests Ordered: Current medicines are reviewed at length with the patient today.  Concerns regarding medicines are outlined above.  Orders Placed This Encounter  Procedures   ECHOCARDIOGRAM COMPLETE     Meds ordered this encounter  Medications   ezetimibe (ZETIA) 10 MG tablet    Sig: Take 1 tablet (10 mg total) by mouth daily.    Dispense:  90 tablet    Refill:  3        Signed, Werner Lean,  MD  07/31/2021 3:57 PM    [place]

## 2021-07-31 NOTE — Research (Signed)
A Randomized pragmatic Chair-Based Home Exercise Intervention for Mitigating Cancer-Related Fatigue in Older Adults Undergoing Chemotherapy for Advanced Disease  07/31/21  While patient was in the clinic, introduced this research to the patient.  Patient Abigail [name] was identified by this research coordinator as a potential candidate for the above listed study.  This Clinical Research Coordinator met with Maddi K Reddish, [id number], on 07/31/21 in a manner and location that ensures patient privacy to discuss participation in the above listed research study.  Patient is Unaccompanied.  A copy of the informed consent document and separate HIPAA Authorization was provided to the patient.  Patient reads, speaks, and understands English.   Patient was provided with the business card of this Coordinator and encouraged to contact the research team with any questions.  Approximately 15 minutes were spent with the patient reviewing the informed consent documents.  Patient was provided the option of taking informed consent documents home to review and was encouraged to review at their convenience with their support network, including other care providers. Patient took the consent documents home to review.  Will follow up with the patient regarding this study during her infusion on Monday.  Kory Isley Clinical Research Coordinator I  07/31/21 11:57 AM  

## 2021-07-31 NOTE — Patient Instructions (Signed)
Medication Instructions:  Your physician has recommended you make the following change in your medication:  START: ezetimibe (Zetia) 10 mg by mouth daily  *If you need a refill on your cardiac medications before your next appointment, please call your pharmacy*   Lab Work: NONE If you have labs (blood work) drawn today and your tests are completely normal, you will receive your results only by: [place] (if you have MyChart) OR A paper copy in the mail If you have any lab test that is abnormal or we need to change your treatment, we will call you to review the results.   Testing/Procedures: Your physician has requested that you have an echocardiogram in May 2023. Echocardiography is a painless test that uses sound waves to create images of your heart. It provides your doctor with information about the size and shape of your heart and how well your heart's chambers and valves are working. This procedure takes approximately one hour. There are no restrictions for this procedure.    Follow-Up: At [facility], you and your health needs are our priority.  As part of our continuing mission to provide you with exceptional heart care, we have created designated Provider Care Teams.  These Care Teams include your primary Cardiologist (physician) and Advanced Practice Providers (APPs -  Physician Assistants and Nurse Practitioners) who all work together to provide you with the care you need, when you need it.  We recommend signing up for the patient portal called "MyChart".  Sign up information is provided on this After Visit Summary.  MyChart is used to connect with patients for Virtual Visits (Telemedicine).  Patients are able to view lab/test results, encounter notes, upcoming appointments, etc.  Non-urgent messages can be sent to your provider as well.   To learn more about what you can do with MyChart, go to [email or web address].    Your next appointment:   6 month(s) after  May  ECHO   The format for your next appointment:   In Person  Provider:   Larae Grooms, MD

## 2021-08-02 MED FILL — Dexamethasone Sodium Phosphate Inj 100 MG/10ML: INTRAMUSCULAR | Qty: 1 | Status: AC

## 2021-08-05 ENCOUNTER — Encounter: Payer: Self-pay | Admitting: Emergency Medicine

## 2021-08-05 ENCOUNTER — Inpatient Hospital Stay: Payer: PPO

## 2021-08-05 ENCOUNTER — Other Ambulatory Visit: Payer: Self-pay

## 2021-08-05 ENCOUNTER — Ambulatory Visit
Admission: RE | Admit: 2021-08-05 | Discharge: 2021-08-05 | Disposition: A | Discharge status: Home / Self Care | Payer: PPO | Source: Ambulatory Visit | Attending: Radiation Oncology | Admitting: Radiation Oncology

## 2021-08-05 VITALS — BP 149/66 | HR 61 | Temp 98.3°F | Resp 18 | Wt 172.8 lb

## 2021-08-05 DIAGNOSIS — C3490 Malignant neoplasm of unspecified part of unspecified bronchus or lung: Secondary | ICD-10-CM

## 2021-08-05 DIAGNOSIS — Z5111 Encounter for antineoplastic chemotherapy: Secondary | ICD-10-CM | POA: Diagnosis not present

## 2021-08-05 DIAGNOSIS — C3432 Malignant neoplasm of lower lobe, left bronchus or lung: Secondary | ICD-10-CM | POA: Diagnosis not present

## 2021-08-05 LAB — CBC WITH DIFFERENTIAL (CANCER CENTER ONLY)
Abs Immature Granulocytes: 0.03 10*3/uL (ref 0.00–0.07)
Basophils Absolute: 0.1 10*3/uL (ref 0.0–0.1)
Basophils Relative: 1 %
Eosinophils Absolute: 0.3 10*3/uL (ref 0.0–0.5)
Eosinophils Relative: 5 %
HCT: 33.2 % — ABNORMAL LOW (ref 36.0–46.0)
Hemoglobin: 10.6 g/dL — ABNORMAL LOW (ref 12.0–15.0)
Immature Granulocytes: 1 %
Lymphocytes Relative: 6 %
Lymphs Abs: 0.4 10*3/uL — ABNORMAL LOW (ref 0.7–4.0)
MCH: 29.8 pg (ref 26.0–34.0)
MCHC: 31.9 g/dL (ref 30.0–36.0)
MCV: 93.3 fL (ref 80.0–100.0)
Monocytes Absolute: 0.5 10*3/uL (ref 0.1–1.0)
Monocytes Relative: 7 %
Neutro Abs: 5.1 10*3/uL (ref 1.7–7.7)
Neutrophils Relative %: 80 %
Platelet Count: 245 10*3/uL (ref 150–400)
RBC: 3.56 MIL/uL — ABNORMAL LOW (ref 3.87–5.11)
RDW: 12.9 % (ref 11.5–15.5)
WBC Count: 6.3 10*3/uL (ref 4.0–10.5)
nRBC: 0 % (ref 0.0–0.2)

## 2021-08-05 LAB — CMP (CANCER CENTER ONLY)
ALT: 10 U/L (ref 0–44)
AST: 11 U/L — ABNORMAL LOW (ref 15–41)
Albumin: 3.5 g/dL (ref 3.5–5.0)
Alkaline Phosphatase: 97 U/L (ref 38–126)
Anion gap: 10 (ref 5–15)
BUN: 24 mg/dL — ABNORMAL HIGH (ref 8–23)
CO2: 21 mmol/L — ABNORMAL LOW (ref 22–32)
Calcium: 8.8 mg/dL — ABNORMAL LOW (ref 8.9–10.3)
Chloride: 105 mmol/L (ref 98–111)
Creatinine: 1.45 mg/dL — ABNORMAL HIGH (ref 0.44–1.00)
GFR, Estimated: 36 mL/min — ABNORMAL LOW (ref >60)
Glucose, Bld: 190 mg/dL — ABNORMAL HIGH (ref 70–99)
Potassium: 4.4 mmol/L (ref 3.5–5.1)
Sodium: 136 mmol/L (ref 135–145)
Total Bilirubin: 0.3 mg/dL (ref 0.3–1.2)
Total Protein: 7.2 g/dL (ref 6.5–8.1)

## 2021-08-05 MED ORDER — SODIUM CHLORIDE 0.9 % IV SOLN
10.0000 mg | Freq: Once | INTRAVENOUS | Status: AC
  Administered 2021-08-05: 10 mg via INTRAVENOUS
  Filled 2021-08-05: qty 10

## 2021-08-05 MED ORDER — FAMOTIDINE 20 MG IN NS 100 ML IVPB
20.0000 mg | Freq: Once | INTRAVENOUS | Status: AC
  Administered 2021-08-05: 20 mg via INTRAVENOUS
  Filled 2021-08-05: qty 100

## 2021-08-05 MED ORDER — DIPHENHYDRAMINE HCL 50 MG/ML IJ SOLN
50.0000 mg | Freq: Once | INTRAMUSCULAR | Status: AC
  Administered 2021-08-05: 50 mg via INTRAVENOUS
  Filled 2021-08-05: qty 1

## 2021-08-05 MED ORDER — SODIUM CHLORIDE 0.9 % IV SOLN
123.4000 mg | Freq: Once | INTRAVENOUS | Status: AC
  Administered 2021-08-05: 120 mg via INTRAVENOUS
  Filled 2021-08-05: qty 12

## 2021-08-05 MED ORDER — SODIUM CHLORIDE 0.9 % IV SOLN: Freq: Once | INTRAVENOUS | Status: AC

## 2021-08-05 MED ORDER — PALONOSETRON HCL INJECTION 0.25 MG/5ML
0.2500 mg | Freq: Once | INTRAVENOUS | Status: AC
  Administered 2021-08-05: 0.25 mg via INTRAVENOUS
  Filled 2021-08-05: qty 5

## 2021-08-05 MED ORDER — SODIUM CHLORIDE 0.9 % IV SOLN
45.0000 mg/m2 | Freq: Once | INTRAVENOUS | Status: AC
  Administered 2021-08-05: 84 mg via INTRAVENOUS
  Filled 2021-08-05: qty 14

## 2021-08-05 NOTE — Patient Instructions (Signed)
[facility]  Discharge Instructions: Thank you for choosing [facility] to provide your oncology and hematology care.   If you have a lab appointment with the [facility], please go directly to the [facility] and check in at the registration area.   Wear comfortable clothing and clothing appropriate for easy access to any Portacath or PICC line.   We strive to give you quality time with your provider. You may need to reschedule your appointment if you arrive late (15 or more minutes).  Arriving late affects you and other patients whose appointments are after yours.  Also, if you miss three or more appointments without notifying the office, you may be dismissed from the clinic at the provider's discretion.      For prescription refill requests, have your pharmacy contact our office and allow 72 hours for refills to be completed.    Today you received the following chemotherapy and/or immunotherapy agents: Paclitaxel, Carboplatin.      To help prevent nausea and vomiting after your treatment, we encourage you to take your nausea medication as directed.  BELOW ARE SYMPTOMS THAT SHOULD BE REPORTED IMMEDIATELY: *FEVER GREATER THAN 100.4 F (38 C) OR HIGHER *CHILLS OR SWEATING *NAUSEA AND VOMITING THAT IS NOT CONTROLLED WITH YOUR NAUSEA MEDICATION *UNUSUAL SHORTNESS OF BREATH *UNUSUAL BRUISING OR BLEEDING *URINARY PROBLEMS (pain or burning when urinating, or frequent urination) *BOWEL PROBLEMS (unusual diarrhea, constipation, pain near the anus) TENDERNESS IN MOUTH AND THROAT WITH OR WITHOUT PRESENCE OF ULCERS (sore throat, sores in mouth, or a toothache) UNUSUAL RASH, SWELLING OR PAIN  UNUSUAL VAGINAL DISCHARGE OR ITCHING   Items with * indicate a potential emergency and should be followed up as soon as possible or go to the Emergency Department if any problems should occur.  Please show the CHEMOTHERAPY ALERT CARD or IMMUNOTHERAPY ALERT CARD  at check-in to the Emergency Department and triage nurse.  Should you have questions after your visit or need to cancel or reschedule your appointment, please contact [facility]  Dept: [phone]  and follow the prompts.  Office hours are 8:00 a.m. to 4:30 p.m. Monday - Friday. Please note that voicemails left after 4:00 p.m. may not be returned until the following business day.  We are closed weekends and major holidays. You have access to a nurse at all times for urgent questions. Please call the main number to the clinic Dept: [phone] and follow the prompts.   For any non-urgent questions, you may also contact your provider using MyChart. We now offer e-Visits for anyone 35 and older to request care online for non-urgent symptoms. For details visit [email or web address].   Also download the MyChart app! Go to the app store, search "MyChart", open the app, select [facility], and log in with your MyChart username and password.  Due to Covid, a mask is required upon entering the hospital/clinic. If you do not have a mask, one will be given to you upon arrival. For doctor visits, patients may have 1 support person aged 48 or older with them. For treatment visits, patients cannot have anyone with them due to current Covid guidelines and our immunocompromised population.     Paclitaxel injection What is this medication? PACLITAXEL (PAK li TAX el) is a chemotherapy drug. It targets fast dividing cells, like cancer cells, and causes these cells to die. This medicine is used to treat ovarian cancer, breast cancer, lung cancer, Kaposi's sarcoma, and other cancers.  This medicine may be used for other purposes; ask your health care provider or pharmacist if you have questions. COMMON BRAND NAME(S): Onxol, Taxol What should I tell my care team before I take this medication? They need to know if you have any of these conditions: history of irregular heartbeat liver  disease low blood counts, like low white cell, platelet, or red cell counts lung or breathing disease, like asthma tingling of the fingers or toes, or other nerve disorder an unusual or allergic reaction to paclitaxel, alcohol, polyoxyethylated castor oil, other chemotherapy, other medicines, foods, dyes, or preservatives pregnant or trying to get pregnant breast-feeding How should I use this medication? This drug is given as an infusion into a vein. It is administered in a hospital or clinic by a specially trained health care professional. Talk to your pediatrician regarding the use of this medicine in children. Special care may be needed. Overdosage: If you think you have taken too much of this medicine contact a poison control center or emergency room at once. NOTE: This medicine is only for you. Do not share this medicine with others. What if I miss a dose? It is important not to miss your dose. Call your doctor or health care professional if you are unable to keep an appointment. What may interact with this medication? Do not take this medicine with any of the following medications: live virus vaccines This medicine may also interact with the following medications: antiviral medicines for hepatitis, HIV or AIDS certain antibiotics like erythromycin and clarithromycin certain medicines for fungal infections like ketoconazole and itraconazole certain medicines for seizures like carbamazepine, phenobarbital, phenytoin gemfibrozil nefazodone rifampin St. John's wort This list may not describe all possible interactions. Give your health care provider a list of all the medicines, herbs, non-prescription drugs, or dietary supplements you use. Also tell them if you smoke, drink alcohol, or use illegal drugs. Some items may interact with your medicine. What should I watch for while using this medication? Your condition will be monitored carefully while you are receiving this medicine. You  will need important blood work done while you are taking this medicine. This medicine can cause serious allergic reactions. To reduce your risk you will need to take other medicine(s) before treatment with this medicine. If you experience allergic reactions like skin rash, itching or hives, swelling of the face, lips, or tongue, tell your doctor or health care professional right away. In some cases, you may be given additional medicines to help with side effects. Follow all directions for their use. This drug may make you feel generally unwell. This is not uncommon, as chemotherapy can affect healthy cells as well as cancer cells. Report any side effects. Continue your course of treatment even though you feel ill unless your doctor tells you to stop. Call your doctor or health care professional for advice if you get a fever, chills or sore throat, or other symptoms of a cold or flu. Do not treat yourself. This drug decreases your body's ability to fight infections. Try to avoid being around people who are sick. This medicine may increase your risk to bruise or bleed. Call your doctor or health care professional if you notice any unusual bleeding. Be careful brushing and flossing your teeth or using a toothpick because you may get an infection or bleed more easily. If you have any dental work done, tell your dentist you are receiving this medicine. Avoid taking products that contain aspirin, acetaminophen, ibuprofen, naproxen, or ketoprofen unless  instructed by your doctor. These medicines may hide a fever. Do not become pregnant while taking this medicine. Women should inform their doctor if they wish to become pregnant or think they might be pregnant. There is a potential for serious side effects to an unborn child. Talk to your health care professional or pharmacist for more information. Do not breast-feed an infant while taking this medicine. Men are advised not to father a child while receiving this  medicine. This product may contain alcohol. Ask your pharmacist or healthcare provider if this medicine contains alcohol. Be sure to tell all healthcare providers you are taking this medicine. Certain medicines, like metronidazole and disulfiram, can cause an unpleasant reaction when taken with alcohol. The reaction includes flushing, headache, nausea, vomiting, sweating, and increased thirst. The reaction can last from 30 minutes to several hours. What side effects may I notice from receiving this medication? Side effects that you should report to your doctor or health care professional as soon as possible: allergic reactions like skin rash, itching or hives, swelling of the face, lips, or tongue breathing problems changes in vision fast, irregular heartbeat high or low blood pressure mouth sores pain, tingling, numbness in the hands or feet signs of decreased platelets or bleeding - bruising, pinpoint red spots on the skin, black, tarry stools, blood in the urine signs of decreased red blood cells - unusually weak or tired, feeling faint or lightheaded, falls signs of infection - fever or chills, cough, sore throat, pain or difficulty passing urine signs and symptoms of liver injury like dark yellow or brown urine; general ill feeling or flu-like symptoms; light-colored stools; loss of appetite; nausea; right upper belly pain; unusually weak or tired; yellowing of the eyes or skin swelling of the ankles, feet, hands unusually slow heartbeat Side effects that usually do not require medical attention (report to your doctor or health care professional if they continue or are bothersome): diarrhea hair loss loss of appetite muscle or joint pain nausea, vomiting pain, redness, or irritation at site where injected tiredness This list may not describe all possible side effects. Call your doctor for medical advice about side effects. You may report side effects to FDA at 1-800-FDA-1088. Where  should I keep my medication? This drug is given in a hospital or clinic and will not be stored at home. NOTE: This sheet is a summary. It may not cover all possible information. If you have questions about this medicine, talk to your doctor, pharmacist, or health care provider.  2022 Elsevier/Gold Standard (2021-04-30 00:00:00)  Carboplatin injection What is this medication? CARBOPLATIN (KAR boe pla tin) is a chemotherapy drug. It targets fast dividing cells, like cancer cells, and causes these cells to die. This medicine is used to treat ovarian cancer and many other cancers. This medicine may be used for other purposes; ask your health care provider or pharmacist if you have questions. COMMON BRAND NAME(S): Paraplatin What should I tell my care team before I take this medication? They need to know if you have any of these conditions: blood disorders hearing problems kidney disease recent or ongoing radiation therapy an unusual or allergic reaction to carboplatin, cisplatin, other chemotherapy, other medicines, foods, dyes, or preservatives pregnant or trying to get pregnant breast-feeding How should I use this medication? This drug is usually given as an infusion into a vein. It is administered in a hospital or clinic by a specially trained health care professional. Talk to your pediatrician regarding the use of this  medicine in children. Special care may be needed. Overdosage: If you think you have taken too much of this medicine contact a poison control center or emergency room at once. NOTE: This medicine is only for you. Do not share this medicine with others. What if I miss a dose? It is important not to miss a dose. Call your doctor or health care professional if you are unable to keep an appointment. What may interact with this medication? medicines for seizures medicines to increase blood counts like filgrastim, pegfilgrastim, sargramostim some antibiotics like amikacin,  gentamicin, neomycin, streptomycin, tobramycin vaccines Talk to your doctor or health care professional before taking any of these medicines: acetaminophen aspirin ibuprofen ketoprofen naproxen This list may not describe all possible interactions. Give your health care provider a list of all the medicines, herbs, non-prescription drugs, or dietary supplements you use. Also tell them if you smoke, drink alcohol, or use illegal drugs. Some items may interact with your medicine. What should I watch for while using this medication? Your condition will be monitored carefully while you are receiving this medicine. You will need important blood work done while you are taking this medicine. This drug may make you feel generally unwell. This is not uncommon, as chemotherapy can affect healthy cells as well as cancer cells. Report any side effects. Continue your course of treatment even though you feel ill unless your doctor tells you to stop. In some cases, you may be given additional medicines to help with side effects. Follow all directions for their use. Call your doctor or health care professional for advice if you get a fever, chills or sore throat, or other symptoms of a cold or flu. Do not treat yourself. This drug decreases your body's ability to fight infections. Try to avoid being around people who are sick. This medicine may increase your risk to bruise or bleed. Call your doctor or health care professional if you notice any unusual bleeding. Be careful brushing and flossing your teeth or using a toothpick because you may get an infection or bleed more easily. If you have any dental work done, tell your dentist you are receiving this medicine. Avoid taking products that contain aspirin, acetaminophen, ibuprofen, naproxen, or ketoprofen unless instructed by your doctor. These medicines may hide a fever. Do not become pregnant while taking this medicine. Women should inform their doctor if they wish  to become pregnant or think they might be pregnant. There is a potential for serious side effects to an unborn child. Talk to your health care professional or pharmacist for more information. Do not breast-feed an infant while taking this medicine. What side effects may I notice from receiving this medication? Side effects that you should report to your doctor or health care professional as soon as possible: allergic reactions like skin rash, itching or hives, swelling of the face, lips, or tongue signs of infection - fever or chills, cough, sore throat, pain or difficulty passing urine signs of decreased platelets or bleeding - bruising, pinpoint red spots on the skin, black, tarry stools, nosebleeds signs of decreased red blood cells - unusually weak or tired, fainting spells, lightheadedness breathing problems changes in hearing changes in vision chest pain high blood pressure low blood counts - This drug may decrease the number of white blood cells, red blood cells and platelets. You may be at increased risk for infections and bleeding. nausea and vomiting pain, swelling, redness or irritation at the injection site pain, tingling, numbness in the hands  or feet problems with balance, talking, walking trouble passing urine or change in the amount of urine Side effects that usually do not require medical attention (report to your doctor or health care professional if they continue or are bothersome): hair loss loss of appetite metallic taste in the mouth or changes in taste This list may not describe all possible side effects. Call your doctor for medical advice about side effects. You may report side effects to FDA at 1-800-FDA-1088. Where should I keep my medication? This drug is given in a hospital or clinic and will not be stored at home. NOTE: This sheet is a summary. It may not cover all possible information. If you have questions about this medicine, talk to your doctor, pharmacist,  or health care provider.  2022 Elsevier/Gold Standard (2008-01-19 00:00:00)

## 2021-08-05 NOTE — Research (Signed)
A Randomized pragmatic Chair-Based Home Exercise Intervention for Mitigating Cancer-Related Fatigue in Older Adults Undergoing Chemotherapy for Advanced Disease  08/05/21  Informed Consent:  Patient Abigail [name] was identified by this research coordinator as a potential candidate for the above listed study.  This Clinical Research Coordinator met with Abigail [name], MRN [id number] on 08/05/21 in a manner and location that ensures patient privacy to discuss participation in the above listed research study.  Patient is Unaccompanied.  Patient was previously provided with informed consent documents.  Patient confirmed they have read the informed consent documents.  As outlined in the informed consent form, this Coordinator and [name] discussed the purpose of the research study, the investigational nature of the study, study procedures and requirements for study participation, potential risks and benefits of study participation, as well as alternatives to participation.  This study is not blinded or double-blinded. The patient understands participation is voluntary and they may withdraw from study participation at any time.  Each study arm was reviewed, and randomization discussed.  This study does not involve an investigational drug or device. This study does not involve a placebo. Patient understands enrollment is pending full eligibility review.   Confidentiality and how the patient's information will be used as part of study participation were discussed.  Patient was informed there is reimbursement provided for their time and effort spent on trial participation.  The patient is encouraged to discuss research study participation with their insurance provider to determine what costs they may incur as part of study participation, including research related injury.    All questions were answered to patient's satisfaction.  The informed consent and separate HIPAA Authorization was reviewed page by page.   The patient's mental and emotional status is appropriate to provide informed consent, and the patient verbalizes an understanding of study participation.  Patient has agreed to participate in the above listed research study and has voluntarily signed the informed consent version updated 10/31/2020 and separate HIPAA Authorization, version 5  on 08/05/21 at 10:23AM.  The patient was provided with a copy of the signed informed consent form and separate HIPAA Authorization for their reference.  No study specific procedures were obtained prior to the signing of the informed consent document.  Approximately 20 minutes were spent with the patient reviewing the informed consent documents.  Patient was not requested to complete a Release of Information form.  The patient rates her fatigue in the past week as a 9 on a scale of 0-10.  She reports not exercising regularly in the past 6 months, less than 2 days per week and an average of less than 60 minutes per week of physical activity.  Plan:  Will register patient and plan to perform baseline assessment next Monday 08/12/2021 prior to infusion.   [name] Clinical Research Coordinator I  08/05/21 11:10 AM

## 2021-08-06 ENCOUNTER — Ambulatory Visit
Admission: RE | Admit: 2021-08-06 | Discharge: 2021-08-06 | Disposition: A | Discharge status: Home / Self Care | Payer: PPO | Source: Ambulatory Visit | Attending: Radiation Oncology | Admitting: Radiation Oncology

## 2021-08-06 DIAGNOSIS — C3432 Malignant neoplasm of lower lobe, left bronchus or lung: Secondary | ICD-10-CM | POA: Diagnosis not present

## 2021-08-06 DIAGNOSIS — C3490 Malignant neoplasm of unspecified part of unspecified bronchus or lung: Secondary | ICD-10-CM

## 2021-08-06 DIAGNOSIS — Z5111 Encounter for antineoplastic chemotherapy: Secondary | ICD-10-CM | POA: Diagnosis not present

## 2021-08-06 MED ORDER — SONAFINE EX EMUL
1.0000 "application " | Freq: Once | CUTANEOUS | Status: AC
  Administered 2021-08-06: 1 via TOPICAL

## 2021-08-06 NOTE — Progress Notes (Signed)
[facility] OFFICE PROGRESS NOTE  [name], [address] [place] [id number]  DIAGNOSIS: stage IIIa (T2a, N2, M0) non-small cell lung cancer, adenocarcinoma presented with left lower lobe lung mass in addition to left hilar and mediastinal lymphadenopathy diagnosed in November 2022 with no actionable mutation on the guardant blood test  PRIOR THERAPY: None  CURRENT THERAPY: Weekly concurrent chemoradiation with carboplatin for an AUC of 2 and paclitaxel 45 mg per metered squared.  First dose on 08/05/2021.  Status post 1 cycle.  INTERVAL HISTORY: Abigail [name] 85 y.o. female returns to the clinic today for a follow-up visit accompanied by her daughter. She was recently diagnosed with lung cancer. She underwent her first cycle of chemotherapy last week which she tolerated well. She reports ongoing fatigue but states she noticed she feels better than before and "more like herself". She still continues to sleep a lot though. She had some constipation following treatment last week. She takes generic colace 2 at night and 1 in the AM. She took tea that has helped her bowel habits. She lost 4 lbs since her last appointment. She is drinking 1 supplemental drink per day. She does not have evidence of thrush, she reports food does not taste as good though. The patient had a brain MRI to complete the staging work-up which fortunately was negative for metastatic disease.  Otherwise the patient is feeling fairly well denies any fever, chills, or night sweats.  She denies any chest pain or hemoptysis.  She denies dyspnea on exertion because she does not typically exert herself but states that showering is hard on her. She has a shower chair but has not used it. She reports a cough but notes it has "slowed down". Denies any nausea vomiting.  Denies any headache or visual changes.  Denies any rashes or skin changes.  She is here today for evaluation and repeat blood work before  starting cycle #2.  MEDICAL HISTORY: Past Medical History:  Diagnosis Date   Anemia    Arthritis    knees, back   Carotid artery occlusion    Chronic kidney disease    stage 3 ckd no nephrologist   Complete uterine prolapse with prolapse of anterior vaginal wall    Complication of anesthesia    hard to wake up per pt   Constipation    Coronary artery disease    Diverticulitis yrs ago coialitis   Dyspnea    History of blood transfusion    Hypertension    Hypothyroid    Numbness    in hands at times   Pneumonia    Pre-diabetes    Scoliosis    STEMI (ST elevation myocardial infarction) ([place]) 10/26/2019   DES RCA   Wears dentures    full dentures   Wears glasses    for reading    ALLERGIES:  is allergic to codeine and norvasc [amlodipine].  MEDICATIONS:  Current Outpatient Medications  Medication Sig Dispense Refill   acetaminophen (TYLENOL) 325 MG tablet Take 2 tablets (650 mg total) by mouth every 6 (six) hours as needed for mild pain (or Fever >/= 101).     albuterol (VENTOLIN HFA) 108 (90 Base) MCG/ACT inhaler Inhale 2 puffs into the lungs every 6 (six) hours as needed for wheezing or shortness of breath. 8 g 0   aspirin EC 81 MG tablet Take 81 mg by mouth at bedtime.     atorvastatin (LIPITOR) 80 MG tablet Take 1  tablet (80 mg total) by mouth daily at 6 PM. 90 tablet 3   Cholecalciferol (VITAMIN D3 PO) Take 2 capsules by mouth daily.     Cranberry 1000 MG CAPS Take 1,000 mg by mouth daily at 12 noon.     cyclobenzaprine (FLEXERIL) 10 MG tablet Take 1 tablet (10 mg total) by mouth 3 (three) times daily as needed for muscle spasms. 30 tablet 1   docusate sodium (COLACE) 100 MG capsule Take 100-200 mg by mouth See admin instructions. Take 100 mg in the morning and 200 mg at bedtime     estradiol (ESTRACE) 0.1 MG/GM vaginal cream Place 0.5 g vaginally once a week. Place 0.5g twice a week at opening of vagina     ezetimibe (ZETIA) 10 MG tablet Take 1 tablet (10 mg total)  by mouth daily. 90 tablet 3   ferrous sulfate 300 (60 Fe) MG/5ML syrup Take 5 mLs (300 mg total) by mouth 2 (two) times daily with a meal. 300 mL 11   levothyroxine (SYNTHROID) 175 MCG tablet TAKE 1 TABLET BY MOUTH EVERY MORNING ON MONDAY - FRIDAY 90 tablet 1   lisinopril-hydrochlorothiazide (ZESTORETIC) 20-25 MG tablet Take 0.5 tablets by mouth 3 (three) times a week. Take on Monday Wednesday and Friday only 90 tablet 1   nitroGLYCERIN (NITROSTAT) 0.4 MG SL tablet Place 1 tablet (0.4 mg total) under the tongue every 5 (five) minutes x 3 doses as needed for chest pain. 25 tablet 3   prochlorperazine (COMPAZINE) 10 MG tablet Take 1 tablet (10 mg total) by mouth every 6 (six) hours as needed for nausea or vomiting. 30 tablet 0   sulfamethoxazole-trimethoprim (BACTRIM) 400-80 MG tablet Take 0.5 tablets by mouth daily. 30 tablet 5   Trospium Chloride 60 MG CP24 TAKE 1 CAPSULE BY MOUTH EVERY DAY 30 capsule 5   No current facility-administered medications for this visit.    SURGICAL HISTORY:  Past Surgical History:  Procedure Laterality Date   ANTERIOR AND POSTERIOR REPAIR WITH SACROSPINOUS FIXATION N/A 12/20/2020   Procedure: SACROSPINOUS LIGAMENT FIXATION;  Surgeon: [name], MD;  Location: [facility];  Service: Gynecology;  Laterality: N/A;  Total time requested for all procedures is 2 hours   [place] age 27   lower   bartholin cyst removal  age 55's   BLADDER SUSPENSION N/A 12/20/2020   Procedure: TRANSVAGINAL TAPE (TVT) PROCEDURE;  Surgeon: [name], MD;  Location: [facility];  Service: Gynecology;  Laterality: N/A;   BRONCHIAL BRUSHINGS  07/15/2021   Procedure: BRONCHIAL BRUSHINGS;  Surgeon: Collene Gobble, MD;  Location: [facility] ENDOSCOPY;  Service: Pulmonary;;   BRONCHIAL NEEDLE ASPIRATION BIOPSY  07/15/2021   Procedure: BRONCHIAL NEEDLE ASPIRATION BIOPSIES;  Surgeon: Collene Gobble, MD;  Location: [facility]OSCOPY;  Service:  Pulmonary;;   CORONARY/GRAFT ACUTE MI REVASCULARIZATION N/A 10/26/2019   Procedure: Coronary/Graft Acute MI Revascularization;  Surgeon: Sherren Mocha, MD;  Location: [facility];  Service: Cardiovascular;  Laterality: N/A;   CYSTOCELE REPAIR N/A 12/20/2020   Procedure: ANTERIOR AND POSTERIOR REPAIR WITH PERINEORRHAPHY;  Surgeon: [name], MD;  Location: [facility];  Service: Gynecology;  Laterality: N/A;   CYSTOSCOPY N/A 12/20/2020   Procedure: CYSTOSCOPY;  Surgeon: [name], MD;  Location[facility];  Service: Gynecology;  Laterality: N/A;   ELBOW SURGERY  1990's   left   ENDOBRONCHIAL ULTRASOUND N/A 07/15/2021   Procedure: ENDOBRONCHIAL ULTRASOUND;  Surgeon: Baltazar Apo  S, MD;  Location: [facility];  Service: Pulmonary;  Laterality: N/A;   HEMOSTASIS CONTROL  07/15/2021   Procedure: HEMOSTASIS CONTROL;  Surgeon: Collene Gobble, MD;  Location: [facility] ENDOSCOPY;  Service: Pulmonary;;   LEFT HEART CATH AND CORONARY ANGIOGRAPHY N/A 10/26/2019   Procedure: LEFT HEART CATH AND CORONARY ANGIOGRAPHY;  Surgeon: Sherren Mocha, MD;  Location: [facility];  Service: Cardiovascular;  Laterality: N/A;   TONSILLECTOMY      REVIEW OF SYSTEMS:   Review of Systems  Constitutional: Positive for fatigue and weight loss. Positive for decreased appetite. Negative for chills and fever.   HENT: Negative for mouth sores, nosebleeds, sore throat and trouble swallowing.   Eyes: Negative for eye problems and icterus.  Respiratory: Positive for dyspnea, especially with showering. Positive for cough (improved). Positive for reported wheezing. Negative for hemoptysis and wheezing.   Cardiovascular: Negative for chest pain and leg swelling.  Gastrointestinal: Positive for constipation. Negative for abdominal pain,  diarrhea, nausea and vomiting.  Genitourinary: Negative for bladder incontinence, difficulty urinating, dysuria, frequency and  hematuria.   Musculoskeletal: Negative for back pain, gait problem, neck pain and neck stiffness.  Skin: Negative for itching and rash.  Neurological: Negative for dizziness, extremity weakness, gait problem, headaches, light-headedness and seizures.  Hematological: Negative for adenopathy. Does not bruise/bleed easily.  Psychiatric/Behavioral: Negative for confusion, depression and sleep disturbance. The patient is not nervous/anxious.     PHYSICAL EXAMINATION:  Blood pressure (!) 166/69, pulse 78, temperature (!) 97.5 F (36.4 C), temperature source Temporal, resp. rate 18, height 5\' 6"  (1.676 m), weight 169 lb 8 oz (76.9 kg), SpO2 95 %.  ECOG PERFORMANCE STATUS: 2  Physical Exam  Constitutional: Oriented to person, place, and time and well-developed, well-nourished, and in no distress.  HENT:  Head: Normocephalic and atraumatic.  Mouth/Throat: Oropharynx is clear and moist. No oropharyngeal exudate.  Eyes: Conjunctivae are normal. Right eye exhibits no discharge. Left eye exhibits no discharge. No scleral icterus.  Neck: Normal range of motion. Neck supple.  Cardiovascular: Normal rate, regular rhythm, normal heart sounds and intact distal pulses.   Pulmonary/Chest: Effort normal and breath sounds normal. No respiratory distress. No wheezes. No rales.  Abdominal: Soft. Bowel sounds are normal. Exhibits no distension and no mass. There is no tenderness.  Musculoskeletal: Normal range of motion. Exhibits no edema.  Lymphadenopathy:    No cervical adenopathy.  Neurological: Alert and oriented to person, place, and time. Exhibits muscle wasting.  Examined in the wheelchair.  Skin: Skin is warm and dry. No rash noted. Not diaphoretic. No erythema. No pallor.  Psychiatric: Mood, memory and judgment normal.  Vitals reviewed.  LABORATORY DATA: Lab Results  Component Value Date   WBC 6.9 08/12/2021   HGB 10.7 (L) 08/12/2021   HCT 33.9 (L) 08/12/2021   MCV 92.6 08/12/2021   PLT 308  08/12/2021      Chemistry      Component Value Date/Time   NA 135 08/12/2021 0900   NA 137 07/10/2021 1118   K 4.6 08/12/2021 0900   CL 104 08/12/2021 0900   CO2 23 08/12/2021 0900   BUN 40 (H) 08/12/2021 0900   BUN 23 07/10/2021 1118   CREATININE 1.69 (H) 08/12/2021 0900   CREATININE 1.08 04/18/2014 1516   GLU 104 12/23/2017 1111      Component Value Date/Time   CALCIUM 9.7 08/12/2021 0900   ALKPHOS 93 08/12/2021 0900   AST 12 (L) 08/12/2021 0900   ALT 10 08/12/2021  0900   BILITOT 0.3 08/12/2021 0900       RADIOGRAPHIC STUDIES:  MR Brain W Wo Contrast  Result Date: 07/30/2021 CLINICAL DATA:  Non-small cell lung carcinoma staging EXAM: MRI HEAD WITHOUT AND WITH CONTRAST TECHNIQUE: Multiplanar, multiecho pulse sequences of the brain and surrounding structures were obtained without and with intravenous contrast. CONTRAST:  92mL GADAVIST GADOBUTROL 1 MMOL/ML IV SOLN COMPARISON:  None. FINDINGS: Brain: No acute infarct, mass effect or extra-axial collection. No acute or chronic hemorrhage. Hyperintense T2-weighted signal is moderately widespread throughout the white matter. Parenchymal volume and CSF spaces are normal. The midline structures are normal. There is no abnormal contrast enhancement. Vascular: Major flow voids are preserved. Skull and upper cervical spine: Normal calvarium and skull base. Visualized upper cervical spine and soft tissues are normal. Sinuses/Orbits:No paranasal sinus fluid levels or advanced mucosal thickening. No mastoid or middle ear effusion. Normal orbits. IMPRESSION: 1. No intracranial metastatic disease. 2. Moderate chronic microvascular ischemia. Electronically Signed   By: Ulyses Jarred M.D.   On: 07/30/2021 01:05     ASSESSMENT/PLAN:  This is a very pleasant 85 year old Caucasian female diagnosed with stage IIIa (T2a, N2, M0) non-small cell lung cancer, adenocarcinoma.  The patient presented with a left lower lobe lung mass in addition to left hilar  mediastinal lymphadenopathy.  She was diagnosed in November 2022.  The patient did not have any actionable mutations by guardant 360 blood test.   The patient is currently undergoing concurrent chemoradiation with carboplatin for an AUC of 2 and paclitaxel 45 mg per metered squared.  She status post 1 cycle and tolerated it well.    Labs were reviewed.  Reviewed the patient's staging brain MRI which fortunately was negative for metastatic disease to the brain. She has baseline CKD.   Recommended the patient proceed with cycle #2 today scheduled.  We will see her back for follow-up visit in 2 weeks for evaluation and repeat blood work before starting cycle #4.  Reviewed constipation education.  Encourage the patient to eat plenty of fruits and vegetables, hydrate, and be as active as possible to help reduce the risk of constipation.  She will continue taking Colace; however, if the patient has not had a bowel movement in 2 or more days more than her normal bowel habits I encouraged her to use a laxative.   The patient's daughter reported frequent wheezing from the patient.  I will send her albuterol to use 1 puff every 6 hours as needed for wheezing, but I encouraged her to follow-up with her pulmonologist if she continues to have breathing issues related to her COPD/wheezing  I spent some time with the patient discussing the importance of good nutrition while undergoing concurrent chemoradiation as it is not uncommon for the patient to lose weight later through the course of treatment due to radiation-induced esophagitis.  Encouraged her to use supplemental drinks. Encouraged her to hydrate with water. No evidence of thrush on exam today. For taste changes, encouraged salt water rinses and biotene.   The patient was advised to call immediately if she has any concerning symptoms in the interval. The patient voices understanding of current disease status and treatment options and is in agreement with  the current care plan. All questions were answered. The patient knows to call the clinic with any problems, questions or concerns. We can certainly see the patient much sooner if necessary       No orders of the defined types were placed in this  encounter.    The total time spent in the appointment was 20-29 minutes.   Dylynn Ketner L Kerston Landeck, PA-C 08/12/21

## 2021-08-06 NOTE — Research (Signed)
A Randomized pragmatic Chair-Based Home Exercise Intervention for Mitigating Cancer-Related Fatigue in Older Adults Undergoing Chemotherapy for Advanced Disease  This Nurse has reviewed this patient's inclusion and exclusion criteria as a second review and confirms [facility] is eligible for study participation.  Patient may continue with enrollment.  Foye Spurling, BSN, RN, [employer] Clinical Research Nurse 08/06/2021 3:18 PM

## 2021-08-07 ENCOUNTER — Other Ambulatory Visit: Payer: Self-pay

## 2021-08-07 ENCOUNTER — Ambulatory Visit
Admission: RE | Admit: 2021-08-07 | Discharge: 2021-08-07 | Disposition: A | Discharge status: Home / Self Care | Payer: PPO | Source: Ambulatory Visit | Attending: Radiation Oncology | Admitting: Radiation Oncology

## 2021-08-07 DIAGNOSIS — Z5111 Encounter for antineoplastic chemotherapy: Secondary | ICD-10-CM | POA: Diagnosis not present

## 2021-08-07 DIAGNOSIS — C3432 Malignant neoplasm of lower lobe, left bronchus or lung: Secondary | ICD-10-CM | POA: Diagnosis not present

## 2021-08-08 ENCOUNTER — Telehealth: Payer: PPO

## 2021-08-08 ENCOUNTER — Ambulatory Visit
Admission: RE | Admit: 2021-08-08 | Discharge: 2021-08-08 | Disposition: A | Discharge status: Home / Self Care | Payer: PPO | Source: Ambulatory Visit | Attending: Radiation Oncology | Admitting: Radiation Oncology

## 2021-08-08 DIAGNOSIS — C3432 Malignant neoplasm of lower lobe, left bronchus or lung: Secondary | ICD-10-CM | POA: Diagnosis not present

## 2021-08-08 DIAGNOSIS — Z5111 Encounter for antineoplastic chemotherapy: Secondary | ICD-10-CM | POA: Diagnosis not present

## 2021-08-08 DIAGNOSIS — C3490 Malignant neoplasm of unspecified part of unspecified bronchus or lung: Secondary | ICD-10-CM | POA: Diagnosis not present

## 2021-08-09 ENCOUNTER — Other Ambulatory Visit: Payer: Self-pay

## 2021-08-09 ENCOUNTER — Encounter: Payer: Self-pay | Admitting: *Deleted

## 2021-08-09 ENCOUNTER — Ambulatory Visit
Admission: RE | Admit: 2021-08-09 | Discharge: 2021-08-09 | Disposition: A | Discharge status: Home / Self Care | Payer: PPO | Source: Ambulatory Visit | Attending: Radiation Oncology | Admitting: Radiation Oncology

## 2021-08-09 ENCOUNTER — Other Ambulatory Visit: Payer: Self-pay | Admitting: Internal Medicine

## 2021-08-09 DIAGNOSIS — Z5111 Encounter for antineoplastic chemotherapy: Secondary | ICD-10-CM | POA: Diagnosis not present

## 2021-08-09 DIAGNOSIS — C3490 Malignant neoplasm of unspecified part of unspecified bronchus or lung: Secondary | ICD-10-CM

## 2021-08-09 DIAGNOSIS — C3432 Malignant neoplasm of lower lobe, left bronchus or lung: Secondary | ICD-10-CM | POA: Diagnosis not present

## 2021-08-09 MED FILL — Dexamethasone Sodium Phosphate Inj 100 MG/10ML: INTRAMUSCULAR | Qty: 1 | Status: AC

## 2021-08-09 NOTE — Progress Notes (Signed)
Oncology Nurse Navigator Documentation  Oncology Nurse Navigator Flowsheets 08/09/2021 07/26/2021 07/19/2021  Abnormal Finding Date - - 04/15/2021  Confirmed Diagnosis Date - - 07/15/2021  Diagnosis Status - Confirmed Diagnosis Complete -  Planned Course of Treatment - Chemo/Radiation Concurrent -  Phase of Treatment Chemo Radiation -  Chemotherapy Actual Start Date: 08/05/2021 - -  Navigator Follow Up Date: 08/28/2021 07/30/2021 07/25/2021  Navigator Follow Up Reason: Follow-up Appointment Appointment Review New Patient Appointment  Navigator Location [facility]  Navigator Encounter Type Appt/Treatment Plan Review Other: Telephone  Telephone - - Appt Confirmation/Clarification  [facility] Date - - 07/25/2021  Multidisiplinary Clinic Type - - Thoracic  Treatment Initiated Date 07/30/2021 - -  Patient Visit Type Other - Other  Treatment Phase Treatment - Pre-Tx/Tx Discussion  Barriers/Navigation Needs Coordination of Care Coordination of Care Coordination of Care;Education  Education - - Other  Interventions Coordination of Care/I followed up on patient's treatment plan and she is set up at this time. I did note that patient needs a provider appt on 1/4 before infusion. I will complete a scheduling message so patient can be called with an appt.  Coordination of Care Coordination of Care;Education;Psycho-Social Support  Acuity Level 2-Minimal Needs (1-2 Barriers Identified) Level 2-Minimal Needs (1-2 Barriers Identified) Level 2-Minimal Needs (1-2 Barriers Identified)  Coordination of Care Other - Appts  Education Method - - Verbal  Time Spent with Patient [phone]

## 2021-08-12 ENCOUNTER — Other Ambulatory Visit: Payer: Self-pay

## 2021-08-12 ENCOUNTER — Inpatient Hospital Stay: Payer: PPO

## 2021-08-12 ENCOUNTER — Inpatient Hospital Stay: Payer: PPO | Admitting: Physician Assistant

## 2021-08-12 ENCOUNTER — Encounter: Payer: Self-pay | Admitting: Emergency Medicine

## 2021-08-12 ENCOUNTER — Ambulatory Visit
Admission: RE | Admit: 2021-08-12 | Discharge: 2021-08-12 | Disposition: A | Discharge status: Home / Self Care | Payer: PPO | Source: Ambulatory Visit | Attending: Radiation Oncology | Admitting: Radiation Oncology

## 2021-08-12 VITALS — BP 166/69 | HR 78 | Temp 97.5°F | Resp 18 | Ht 66.0 in | Wt 169.5 lb

## 2021-08-12 DIAGNOSIS — C3432 Malignant neoplasm of lower lobe, left bronchus or lung: Secondary | ICD-10-CM | POA: Diagnosis not present

## 2021-08-12 DIAGNOSIS — Z5111 Encounter for antineoplastic chemotherapy: Secondary | ICD-10-CM | POA: Diagnosis not present

## 2021-08-12 DIAGNOSIS — C3490 Malignant neoplasm of unspecified part of unspecified bronchus or lung: Secondary | ICD-10-CM

## 2021-08-12 DIAGNOSIS — R062 Wheezing: Secondary | ICD-10-CM | POA: Diagnosis not present

## 2021-08-12 LAB — CBC WITH DIFFERENTIAL (CANCER CENTER ONLY)
Abs Immature Granulocytes: 0.06 10*3/uL (ref 0.00–0.07)
Basophils Absolute: 0 10*3/uL (ref 0.0–0.1)
Basophils Relative: 1 %
Eosinophils Absolute: 0.3 10*3/uL (ref 0.0–0.5)
Eosinophils Relative: 5 %
HCT: 33.9 % — ABNORMAL LOW (ref 36.0–46.0)
Hemoglobin: 10.7 g/dL — ABNORMAL LOW (ref 12.0–15.0)
Immature Granulocytes: 1 %
Lymphocytes Relative: 5 %
Lymphs Abs: 0.3 10*3/uL — ABNORMAL LOW (ref 0.7–4.0)
MCH: 29.2 pg (ref 26.0–34.0)
MCHC: 31.6 g/dL (ref 30.0–36.0)
MCV: 92.6 fL (ref 80.0–100.0)
Monocytes Absolute: 0.5 10*3/uL (ref 0.1–1.0)
Monocytes Relative: 7 %
Neutro Abs: 5.7 10*3/uL (ref 1.7–7.7)
Neutrophils Relative %: 81 %
Platelet Count: 308 10*3/uL (ref 150–400)
RBC: 3.66 MIL/uL — ABNORMAL LOW (ref 3.87–5.11)
RDW: 13.2 % (ref 11.5–15.5)
WBC Count: 6.9 10*3/uL (ref 4.0–10.5)
nRBC: 0 % (ref 0.0–0.2)

## 2021-08-12 LAB — CMP (CANCER CENTER ONLY)
ALT: 10 U/L (ref 0–44)
AST: 12 U/L — ABNORMAL LOW (ref 15–41)
Albumin: 3.7 g/dL (ref 3.5–5.0)
Alkaline Phosphatase: 93 U/L (ref 38–126)
Anion gap: 8 (ref 5–15)
BUN: 40 mg/dL — ABNORMAL HIGH (ref 8–23)
CO2: 23 mmol/L (ref 22–32)
Calcium: 9.7 mg/dL (ref 8.9–10.3)
Chloride: 104 mmol/L (ref 98–111)
Creatinine: 1.69 mg/dL — ABNORMAL HIGH (ref 0.44–1.00)
GFR, Estimated: 30 mL/min — ABNORMAL LOW (ref >60)
Glucose, Bld: 143 mg/dL — ABNORMAL HIGH (ref 70–99)
Potassium: 4.6 mmol/L (ref 3.5–5.1)
Sodium: 135 mmol/L (ref 135–145)
Total Bilirubin: 0.3 mg/dL (ref 0.3–1.2)
Total Protein: 7.5 g/dL (ref 6.5–8.1)

## 2021-08-12 MED ORDER — FAMOTIDINE 20 MG IN NS 100 ML IVPB
20.0000 mg | Freq: Once | INTRAVENOUS | Status: AC
  Administered 2021-08-12: 12:00:00 20 mg via INTRAVENOUS
  Filled 2021-08-12: qty 100

## 2021-08-12 MED ORDER — SODIUM CHLORIDE 0.9 % IV SOLN
Freq: Once | INTRAVENOUS | Status: AC
Start: 2021-08-12 — End: 2021-08-12

## 2021-08-12 MED ORDER — SODIUM CHLORIDE 0.9 % IV SOLN
10.0000 mg | Freq: Once | INTRAVENOUS | Status: AC
  Administered 2021-08-12: 12:00:00 10 mg via INTRAVENOUS
  Filled 2021-08-12: qty 10

## 2021-08-12 MED ORDER — PALONOSETRON HCL INJECTION 0.25 MG/5ML
0.2500 mg | Freq: Once | INTRAVENOUS | Status: AC
  Administered 2021-08-12: 11:00:00 0.25 mg via INTRAVENOUS
  Filled 2021-08-12: qty 5

## 2021-08-12 MED ORDER — DIPHENHYDRAMINE HCL 50 MG/ML IJ SOLN
50.0000 mg | Freq: Once | INTRAMUSCULAR | Status: AC
Start: 2021-08-12 — End: 2021-08-12
  Administered 2021-08-12: 11:00:00 50 mg via INTRAVENOUS
  Filled 2021-08-12: qty 1

## 2021-08-12 MED ORDER — ALBUTEROL SULFATE HFA 108 (90 BASE) MCG/ACT IN AERS: 2.0000 | INHALATION_SPRAY | Freq: Four times a day (QID) | RESPIRATORY_TRACT | 0 refills | Status: DC | PRN

## 2021-08-12 MED ORDER — SODIUM CHLORIDE 0.9 % IV SOLN
123.4000 mg | Freq: Once | INTRAVENOUS | Status: AC
  Administered 2021-08-12: 14:00:00 120 mg via INTRAVENOUS
  Filled 2021-08-12: qty 12

## 2021-08-12 MED ORDER — SODIUM CHLORIDE 0.9 % IV SOLN
45.0000 mg/m2 | Freq: Once | INTRAVENOUS | Status: AC
  Administered 2021-08-12: 13:00:00 84 mg via INTRAVENOUS
  Filled 2021-08-12: qty 14

## 2021-08-12 NOTE — Research (Addendum)
A Randomized pragmatic Chair-Based Home Exercise Intervention for Mitigating Cancer-Related Fatigue in Older Adults Undergoing Chemotherapy for Advanced Disease  08/12/21 - Baseline Visit  The patient presented to the clinic today for her appointment with Cassandra Heilingoetter, NP and cycle #2 of chemotherapy.  The patient is using a wheelchair in the office today and reports feeling weak and unsteady on her feet.  PROs:  The patient completed the required baseline PROs (Demographics info, PROMIS Fatigue short form 7a, CES-D, OARS IADL, Efficacy for Exercise Scale, and Fall History) per protocol.  Patient scored 14 on CES-D which does not require MD notification.  SPPB:  The patient was able to stand with assistance, and completed the side by side stand and semi-tandem stand holding for 10 seconds on each without assistance.  She did not feel stable enough to attempt the tandem stand.  The patient did not feel safe to walk for the gait speed test in the hallway due to her imbalance.  The patient was not able to stand up from her chair without using her arms.    Randomization:  The patient was randomized on 08/08/2021 and received the Chair Exercise arm.  Exercise Education:  Demonstrated chair based exercises for the patient per protocol and exercise brochure.  Encouraged to start slow with goal of increasing exercise to 30 minutes per day, 5 days per week.  Instructed patient to fill out exercise log daily with minutes exercised and RPE (Rate of Perceived Exertion).  Gift Card:  The patient was given a $25 [employer] gift card per protocol.  Plan:  Will call the patient every two weeks for coaching reminder calls starting next week.  The next in person research assessment will be in approximately 6 weeks.  The patient was thanked for her time and participation in this research study.  She was provided with this coordinator's contact information and was advised to call with any  questions.   [name] Clinical Research Coordinator I  08/12/21 2:17 PM

## 2021-08-12 NOTE — Patient Instructions (Addendum)
[facility]  Discharge Instructions: Thank you for choosing [facility] to provide your oncology and hematology care.   If you have a lab appointment with the [facility], please go directly to the [facility] and check in at the registration area.   Wear comfortable clothing and clothing appropriate for easy access to any Portacath or PICC line.   We strive to give you quality time with your provider. You may need to reschedule your appointment if you arrive late (15 or more minutes).  Arriving late affects you and other patients whose appointments are after yours.  Also, if you miss three or more appointments without notifying the office, you may be dismissed from the clinic at the providers discretion.      For prescription refill requests, have your pharmacy contact our office and allow 72 hours for refills to be completed.    Today you received the following chemotherapy and/or immunotherapy agents: Taxol/Carbo.      To help prevent nausea and vomiting after your treatment, we encourage you to take your nausea medication as directed.  BELOW ARE SYMPTOMS THAT SHOULD BE REPORTED IMMEDIATELY: *FEVER GREATER THAN 100.4 F (38 C) OR HIGHER *CHILLS OR SWEATING *NAUSEA AND VOMITING THAT IS NOT CONTROLLED WITH YOUR NAUSEA MEDICATION *UNUSUAL SHORTNESS OF BREATH *UNUSUAL BRUISING OR BLEEDING *URINARY PROBLEMS (pain or burning when urinating, or frequent urination) *BOWEL PROBLEMS (unusual diarrhea, constipation, pain near the anus) TENDERNESS IN MOUTH AND THROAT WITH OR WITHOUT PRESENCE OF ULCERS (sore throat, sores in mouth, or a toothache) UNUSUAL RASH, SWELLING OR PAIN  UNUSUAL VAGINAL DISCHARGE OR ITCHING   Items with * indicate a potential emergency and should be followed up as soon as possible or go to the Emergency Department if any problems should occur.  Please show the CHEMOTHERAPY ALERT CARD or IMMUNOTHERAPY ALERT CARD at check-in  to the Emergency Department and triage nurse.  Should you have questions after your visit or need to cancel or reschedule your appointment, please contact [facility]  Dept: [phone]  and follow the prompts.  Office hours are 8:00 a.m. to 4:30 p.m. Monday - Friday. Please note that voicemails left after 4:00 p.m. may not be returned until the following business day.  We are closed weekends and major holidays. You have access to a nurse at all times for urgent questions. Please call the main number to the clinic Dept: [phone] and follow the prompts.   For any non-urgent questions, you may also contact your provider using MyChart. We now offer e-Visits for anyone 31 and older to request care online for non-urgent symptoms. For details visit [email or web address].   Also download the MyChart app! Go to the app store, search "MyChart", open the app, select [facility], and log in with your MyChart username and password.  Due to Covid, a mask is required upon entering the hospital/clinic. If you do not have a mask, one will be given to you upon arrival. For doctor visits, patients may have 1 support person aged 6 or older with them. For treatment visits, patients cannot have anyone with them due to current Covid guidelines and our immunocompromised population.   Paclitaxel injection What is this medication? PACLITAXEL (PAK li TAX el) is a chemotherapy drug. It targets fast dividing cells, like cancer cells, and causes these cells to die. This medicine is used to treat ovarian cancer, breast cancer, lung cancer, Kaposi's sarcoma, and other cancers. This medicine may  be used for other purposes; ask your health care provider or pharmacist if you have questions. COMMON BRAND NAME(S): Onxol, Taxol What should I tell my care team before I take this medication? They need to know if you have any of these conditions: history of irregular heartbeat liver disease low  blood counts, like low white cell, platelet, or red cell counts lung or breathing disease, like asthma tingling of the fingers or toes, or other nerve disorder an unusual or allergic reaction to paclitaxel, alcohol, polyoxyethylated castor oil, other chemotherapy, other medicines, foods, dyes, or preservatives pregnant or trying to get pregnant breast-feeding How should I use this medication? This drug is given as an infusion into a vein. It is administered in a hospital or clinic by a specially trained health care professional. Talk to your pediatrician regarding the use of this medicine in children. Special care may be needed. Overdosage: If you think you have taken too much of this medicine contact a poison control center or emergency room at once. NOTE: This medicine is only for you. Do not share this medicine with others. What if I miss a dose? It is important not to miss your dose. Call your doctor or health care professional if you are unable to keep an appointment. What may interact with this medication? Do not take this medicine with any of the following medications: live virus vaccines This medicine may also interact with the following medications: antiviral medicines for hepatitis, HIV or AIDS certain antibiotics like erythromycin and clarithromycin certain medicines for fungal infections like ketoconazole and itraconazole certain medicines for seizures like carbamazepine, phenobarbital, phenytoin gemfibrozil nefazodone rifampin St. John's wort This list may not describe all possible interactions. Give your health care provider a list of all the medicines, herbs, non-prescription drugs, or dietary supplements you use. Also tell them if you smoke, drink alcohol, or use illegal drugs. Some items may interact with your medicine. What should I watch for while using this medication? Your condition will be monitored carefully while you are receiving this medicine. You will need  important blood work done while you are taking this medicine. This medicine can cause serious allergic reactions. To reduce your risk you will need to take other medicine(s) before treatment with this medicine. If you experience allergic reactions like skin rash, itching or hives, swelling of the face, lips, or tongue, tell your doctor or health care professional right away. In some cases, you may be given additional medicines to help with side effects. Follow all directions for their use. This drug may make you feel generally unwell. This is not uncommon, as chemotherapy can affect healthy cells as well as cancer cells. Report any side effects. Continue your course of treatment even though you feel ill unless your doctor tells you to stop. Call your doctor or health care professional for advice if you get a fever, chills or sore throat, or other symptoms of a cold or flu. Do not treat yourself. This drug decreases your body's ability to fight infections. Try to avoid being around people who are sick. This medicine may increase your risk to bruise or bleed. Call your doctor or health care professional if you notice any unusual bleeding. Be careful brushing and flossing your teeth or using a toothpick because you may get an infection or bleed more easily. If you have any dental work done, tell your dentist you are receiving this medicine. Avoid taking products that contain aspirin, acetaminophen, ibuprofen, naproxen, or ketoprofen unless instructed by your  doctor. These medicines may hide a fever. Do not become pregnant while taking this medicine. Women should inform their doctor if they wish to become pregnant or think they might be pregnant. There is a potential for serious side effects to an unborn child. Talk to your health care professional or pharmacist for more information. Do not breast-feed an infant while taking this medicine. Men are advised not to father a child while receiving this  medicine. This product may contain alcohol. Ask your pharmacist or healthcare provider if this medicine contains alcohol. Be sure to tell all healthcare providers you are taking this medicine. Certain medicines, like metronidazole and disulfiram, can cause an unpleasant reaction when taken with alcohol. The reaction includes flushing, headache, nausea, vomiting, sweating, and increased thirst. The reaction can last from 30 minutes to several hours. What side effects may I notice from receiving this medication? Side effects that you should report to your doctor or health care professional as soon as possible: allergic reactions like skin rash, itching or hives, swelling of the face, lips, or tongue breathing problems changes in vision fast, irregular heartbeat high or low blood pressure mouth sores pain, tingling, numbness in the hands or feet signs of decreased platelets or bleeding - bruising, pinpoint red spots on the skin, black, tarry stools, blood in the urine signs of decreased red blood cells - unusually weak or tired, feeling faint or lightheaded, falls signs of infection - fever or chills, cough, sore throat, pain or difficulty passing urine signs and symptoms of liver injury like dark yellow or brown urine; general ill feeling or flu-like symptoms; light-colored stools; loss of appetite; nausea; right upper belly pain; unusually weak or tired; yellowing of the eyes or skin swelling of the ankles, feet, hands unusually slow heartbeat Side effects that usually do not require medical attention (report to your doctor or health care professional if they continue or are bothersome): diarrhea hair loss loss of appetite muscle or joint pain nausea, vomiting pain, redness, or irritation at site where injected tiredness This list may not describe all possible side effects. Call your doctor for medical advice about side effects. You may report side effects to FDA at 1-800-FDA-1088. Where  should I keep my medication? This drug is given in a hospital or clinic and will not be stored at home. NOTE: This sheet is a summary. It may not cover all possible information. If you have questions about this medicine, talk to your doctor, pharmacist, or health care provider.  2022 Elsevier/Gold Standard (2021-04-30 00:00:00)  Carboplatin injection What is this medication? CARBOPLATIN (KAR boe pla tin) is a chemotherapy drug. It targets fast dividing cells, like cancer cells, and causes these cells to die. This medicine is used to treat ovarian cancer and many other cancers. This medicine may be used for other purposes; ask your health care provider or pharmacist if you have questions. COMMON BRAND NAME(S): Paraplatin What should I tell my care team before I take this medication? They need to know if you have any of these conditions: blood disorders hearing problems kidney disease recent or ongoing radiation therapy an unusual or allergic reaction to carboplatin, cisplatin, other chemotherapy, other medicines, foods, dyes, or preservatives pregnant or trying to get pregnant breast-feeding How should I use this medication? This drug is usually given as an infusion into a vein. It is administered in a hospital or clinic by a specially trained health care professional. Talk to your pediatrician regarding the use of this medicine in children.  Special care may be needed. Overdosage: If you think you have taken too much of this medicine contact a poison control center or emergency room at once. NOTE: This medicine is only for you. Do not share this medicine with others. What if I miss a dose? It is important not to miss a dose. Call your doctor or health care professional if you are unable to keep an appointment. What may interact with this medication? medicines for seizures medicines to increase blood counts like filgrastim, pegfilgrastim, sargramostim some antibiotics like amikacin,  gentamicin, neomycin, streptomycin, tobramycin vaccines Talk to your doctor or health care professional before taking any of these medicines: acetaminophen aspirin ibuprofen ketoprofen naproxen This list may not describe all possible interactions. Give your health care provider a list of all the medicines, herbs, non-prescription drugs, or dietary supplements you use. Also tell them if you smoke, drink alcohol, or use illegal drugs. Some items may interact with your medicine. What should I watch for while using this medication? Your condition will be monitored carefully while you are receiving this medicine. You will need important blood work done while you are taking this medicine. This drug may make you feel generally unwell. This is not uncommon, as chemotherapy can affect healthy cells as well as cancer cells. Report any side effects. Continue your course of treatment even though you feel ill unless your doctor tells you to stop. In some cases, you may be given additional medicines to help with side effects. Follow all directions for their use. Call your doctor or health care professional for advice if you get a fever, chills or sore throat, or other symptoms of a cold or flu. Do not treat yourself. This drug decreases your body's ability to fight infections. Try to avoid being around people who are sick. This medicine may increase your risk to bruise or bleed. Call your doctor or health care professional if you notice any unusual bleeding. Be careful brushing and flossing your teeth or using a toothpick because you may get an infection or bleed more easily. If you have any dental work done, tell your dentist you are receiving this medicine. Avoid taking products that contain aspirin, acetaminophen, ibuprofen, naproxen, or ketoprofen unless instructed by your doctor. These medicines may hide a fever. Do not become pregnant while taking this medicine. Women should inform their doctor if they wish  to become pregnant or think they might be pregnant. There is a potential for serious side effects to an unborn child. Talk to your health care professional or pharmacist for more information. Do not breast-feed an infant while taking this medicine. What side effects may I notice from receiving this medication? Side effects that you should report to your doctor or health care professional as soon as possible: allergic reactions like skin rash, itching or hives, swelling of the face, lips, or tongue signs of infection - fever or chills, cough, sore throat, pain or difficulty passing urine signs of decreased platelets or bleeding - bruising, pinpoint red spots on the skin, black, tarry stools, nosebleeds signs of decreased red blood cells - unusually weak or tired, fainting spells, lightheadedness breathing problems changes in hearing changes in vision chest pain high blood pressure low blood counts - This drug may decrease the number of white blood cells, red blood cells and platelets. You may be at increased risk for infections and bleeding. nausea and vomiting pain, swelling, redness or irritation at the injection site pain, tingling, numbness in the hands or feet problems  with balance, talking, walking trouble passing urine or change in the amount of urine Side effects that usually do not require medical attention (report to your doctor or health care professional if they continue or are bothersome): hair loss loss of appetite metallic taste in the mouth or changes in taste This list may not describe all possible side effects. Call your doctor for medical advice about side effects. You may report side effects to FDA at 1-800-FDA-1088. Where should I keep my medication? This drug is given in a hospital or clinic and will not be stored at home. NOTE: This sheet is a summary. It may not cover all possible information. If you have questions about this medicine, talk to your doctor, pharmacist,  or health care provider.  2022 Elsevier/Gold Standard (2008-01-19 00:00:00)

## 2021-08-12 NOTE — Progress Notes (Signed)
Per Cassie, it is ok to treat pt today with Carboplatin and taxol and serum creatinine of 1.69.

## 2021-08-13 ENCOUNTER — Ambulatory Visit
Admission: RE | Admit: 2021-08-13 | Discharge: 2021-08-13 | Disposition: A | Discharge status: Home / Self Care | Payer: PPO | Source: Ambulatory Visit | Attending: Radiation Oncology | Admitting: Radiation Oncology

## 2021-08-13 ENCOUNTER — Encounter: Payer: Self-pay | Admitting: Internal Medicine

## 2021-08-13 DIAGNOSIS — Z5111 Encounter for antineoplastic chemotherapy: Secondary | ICD-10-CM | POA: Diagnosis not present

## 2021-08-13 DIAGNOSIS — C3432 Malignant neoplasm of lower lobe, left bronchus or lung: Secondary | ICD-10-CM | POA: Diagnosis not present

## 2021-08-14 ENCOUNTER — Ambulatory Visit
Admission: RE | Admit: 2021-08-14 | Discharge: 2021-08-14 | Disposition: A | Discharge status: Home / Self Care | Payer: PPO | Source: Ambulatory Visit | Attending: Radiation Oncology | Admitting: Radiation Oncology

## 2021-08-14 ENCOUNTER — Other Ambulatory Visit: Payer: Self-pay

## 2021-08-14 DIAGNOSIS — Z5111 Encounter for antineoplastic chemotherapy: Secondary | ICD-10-CM | POA: Diagnosis not present

## 2021-08-14 DIAGNOSIS — C3432 Malignant neoplasm of lower lobe, left bronchus or lung: Secondary | ICD-10-CM | POA: Diagnosis not present

## 2021-08-15 ENCOUNTER — Ambulatory Visit
Admission: RE | Admit: 2021-08-15 | Discharge: 2021-08-15 | Disposition: A | Discharge status: Home / Self Care | Payer: PPO | Source: Ambulatory Visit | Attending: Radiation Oncology | Admitting: Radiation Oncology

## 2021-08-15 ENCOUNTER — Other Ambulatory Visit: Payer: Self-pay

## 2021-08-15 DIAGNOSIS — Z5111 Encounter for antineoplastic chemotherapy: Secondary | ICD-10-CM | POA: Diagnosis not present

## 2021-08-15 DIAGNOSIS — C3432 Malignant neoplasm of lower lobe, left bronchus or lung: Secondary | ICD-10-CM | POA: Diagnosis not present

## 2021-08-16 ENCOUNTER — Ambulatory Visit
Admission: RE | Admit: 2021-08-16 | Discharge: 2021-08-16 | Disposition: A | Discharge status: Home / Self Care | Payer: PPO | Source: Ambulatory Visit | Attending: Radiation Oncology | Admitting: Radiation Oncology

## 2021-08-16 DIAGNOSIS — Z5111 Encounter for antineoplastic chemotherapy: Secondary | ICD-10-CM | POA: Diagnosis not present

## 2021-08-16 DIAGNOSIS — C3432 Malignant neoplasm of lower lobe, left bronchus or lung: Secondary | ICD-10-CM | POA: Diagnosis not present

## 2021-08-20 ENCOUNTER — Other Ambulatory Visit: Payer: Self-pay | Admitting: Radiation Oncology

## 2021-08-20 ENCOUNTER — Ambulatory Visit
Admission: RE | Admit: 2021-08-20 | Discharge: 2021-08-20 | Disposition: A | Discharge status: Home / Self Care | Payer: PPO | Source: Ambulatory Visit | Attending: Radiation Oncology | Admitting: Radiation Oncology

## 2021-08-20 ENCOUNTER — Telehealth: Payer: Self-pay | Admitting: Emergency Medicine

## 2021-08-20 ENCOUNTER — Other Ambulatory Visit: Payer: Self-pay

## 2021-08-20 ENCOUNTER — Ambulatory Visit (INDEPENDENT_AMBULATORY_CARE_PROVIDER_SITE_OTHER): Payer: PPO | Admitting: Emergency Medicine

## 2021-08-20 ENCOUNTER — Encounter: Payer: Self-pay | Admitting: Emergency Medicine

## 2021-08-20 DIAGNOSIS — J449 Chronic obstructive pulmonary disease, unspecified: Secondary | ICD-10-CM | POA: Diagnosis not present

## 2021-08-20 DIAGNOSIS — R1319 Other dysphagia: Secondary | ICD-10-CM | POA: Diagnosis not present

## 2021-08-20 DIAGNOSIS — R1310 Dysphagia, unspecified: Secondary | ICD-10-CM | POA: Insufficient documentation

## 2021-08-20 DIAGNOSIS — Z5111 Encounter for antineoplastic chemotherapy: Secondary | ICD-10-CM | POA: Diagnosis not present

## 2021-08-20 DIAGNOSIS — C3432 Malignant neoplasm of lower lobe, left bronchus or lung: Secondary | ICD-10-CM | POA: Diagnosis not present

## 2021-08-20 MED ORDER — SUCRALFATE 1 G PO TABS: 1.0000 g | ORAL_TABLET | Freq: Three times a day (TID) | ORAL | 1 refills | Status: DC

## 2021-08-20 NOTE — Assessment & Plan Note (Signed)
Feels like food may be getting stuck part way down her esophagus, does ultimately transit.  She complains of burning, increased GERD symptoms.  At risk for esophagitis given her treatment regimen.  I will let radiation oncology know that she is having symptoms.

## 2021-08-20 NOTE — Progress Notes (Signed)
Subjective:    Patient ID: Abigail [name], female    DOB: 12/03/1935, 85 y.o.   MRN: [id number]  HPI 85 year old woman, former smoker (23 pack years) with a history of CAD, carotid artery disease, CKD stage III, hypertension, hypothyroidism, scoliosis.  She is referred today for an abnormal CT scan of the chest as well as shortness of breath.  She not currently on any bronchodilator medications. No evidence overt bleeding, but she had anemia earlier this year - her anti-plt meds were stopped.   She has some occasional SOB, she has cough productive of clear mucous. She is experiencing weakness, no real exertional SOB.   CT scan of the chest performed 04/15/2021 which was performed in the ED when she was experiencing weakness.  Reviewed by me, shows a left lower lobe mass 2.5 x 3.0 cm with an enlarged left hilar lymph node 1.5 cm.   ROV 08/20/21 --Abigail [name] is 50 and has a history of tobacco use, CAD, CKD stage III, hypertension, hypothyroidism.  I saw her in November for an abnormal CT scan of the chest with a left lower lobe mass and left hilar lymphadenopathy.  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  She is undergoing chemoradiation. She has albuterol that was just given to her, is not on any scheduled bronchodilator regimen.  Today she reports that she is having GERD, some swallowing difficulty and burning. ? Food transiently getting stuck. New since she started her treatment routine. She had a small bit of scant hemoptysis this am - that's the only blood she has seen. She has some exertional SOB, has been worse with the colder weather.      Review of Systems As per HPi  Past Medical History:  Diagnosis Date   Anemia    Arthritis    knees, back   Carotid artery occlusion    Chronic kidney disease    stage 3 ckd no nephrologist   Complete uterine prolapse with prolapse of anterior vaginal wall     Complication of anesthesia    hard to wake up per pt   Constipation    Coronary artery disease    Diverticulitis yrs ago coialitis   Dyspnea    History of blood transfusion    Hypertension    Hypothyroid    Numbness    in hands at times   Pneumonia    Pre-diabetes    Scoliosis    STEMI (ST elevation myocardial infarction) ([facility]) 10/26/2019   DES RCA   Wears dentures    full dentures   Wears glasses    for reading     Family History  Problem Relation Age of Onset   Hypertension Mother      Social History   Socioeconomic History   Marital status: Widowed    Spouse name: Not on file   Number of children: Not on file   Years of education: Not on file   Highest education level: Not on file  Occupational History   Occupation: semi retired  Tobacco Use   Smoking status: Former    Packs/day: 1.50    Years: 35.00    Pack years: 52.50    Types: Cigarettes    Quit date: 04/05/1986    Years since quitting: 35.4   Smokeless tobacco: Never  Vaping Use   Vaping Use: Never used  Substance and Sexual Activity   Alcohol use: No  Drug use: No   Sexual activity: Not Currently    Birth control/protection: Post-menopausal  Other Topics Concern   Not on file  Social History Narrative   Not on file   Social Determinants of Health   Financial Resource Strain: Low Risk    Difficulty of Paying Living Expenses: Not hard at all  Food Insecurity: No Food Insecurity   Worried About Charity fundraiser in the Last Year: Never true   [place] in the Last Year: Never true  Transportation Needs: No Transportation Needs   Lack of Transportation (Medical): No   Lack of Transportation (Non-Medical): No  Physical Activity: Inactive   Days of Exercise per Week: 0 days   Minutes of Exercise per Session: 0 min  Stress: No Stress Concern Present   Feeling of Stress : Not at all  Social Connections: Not on file  Intimate Partner Violence: Not on file     Allergies  Allergen  Reactions   Codeine Nausea And Vomiting   Norvasc [Amlodipine] Rash and Other (See Comments)    rash     Outpatient Medications Prior to Visit  Medication Sig Dispense Refill   acetaminophen (TYLENOL) 325 MG tablet Take 2 tablets (650 mg total) by mouth every 6 (six) hours as needed for mild pain (or Fever >/= 101).     albuterol (VENTOLIN HFA) 108 (90 Base) MCG/ACT inhaler Inhale 2 puffs into the lungs every 6 (six) hours as needed for wheezing or shortness of breath. 8 g 0   aspirin EC 81 MG tablet Take 81 mg by mouth at bedtime.     atorvastatin (LIPITOR) 80 MG tablet Take 1 tablet (80 mg total) by mouth daily at 6 PM. 90 tablet 3   Cholecalciferol (VITAMIN D3 PO) Take 2 capsules by mouth daily.     Cranberry 1000 MG CAPS Take 1,000 mg by mouth daily at 12 noon.     cyclobenzaprine (FLEXERIL) 10 MG tablet Take 1 tablet (10 mg total) by mouth 3 (three) times daily as needed for muscle spasms. 30 tablet 1   docusate sodium (COLACE) 100 MG capsule Take 100-200 mg by mouth See admin instructions. Take 100 mg in the morning and 200 mg at bedtime     estradiol (ESTRACE) 0.1 MG/GM vaginal cream Place 0.5 g vaginally once a week. Place 0.5g twice a week at opening of vagina     ezetimibe (ZETIA) 10 MG tablet Take 1 tablet (10 mg total) by mouth daily. 90 tablet 3   ferrous sulfate 300 (60 Fe) MG/5ML syrup Take 5 mLs (300 mg total) by mouth 2 (two) times daily with a meal. 300 mL 11   levothyroxine (SYNTHROID) 175 MCG tablet TAKE 1 TABLET BY MOUTH EVERY MORNING ON MONDAY - FRIDAY 90 tablet 1   lisinopril-hydrochlorothiazide (ZESTORETIC) 20-25 MG tablet Take 0.5 tablets by mouth 3 (three) times a week. Take on Monday Wednesday and Friday only 90 tablet 1   nitroGLYCERIN (NITROSTAT) 0.4 MG SL tablet Place 1 tablet (0.4 mg total) under the tongue every 5 (five) minutes x 3 doses as needed for chest pain. 25 tablet 3   prochlorperazine (COMPAZINE) 10 MG tablet Take 1 tablet (10 mg total) by mouth every 6  (six) hours as needed for nausea or vomiting. 30 tablet 0   sulfamethoxazole-trimethoprim (BACTRIM) 400-80 MG tablet Take 0.5 tablets by mouth daily. 30 tablet 5   Trospium Chloride 60 MG CP24 TAKE 1 CAPSULE BY MOUTH EVERY DAY 30  capsule 5   No facility-administered medications prior to visit.         Objective:   Physical Exam Vitals:   08/20/21 1016  BP: 128/74  Pulse: 81  Temp: 98 F (36.7 C)  TempSrc: Oral  SpO2: 93%  Weight: 169 lb 6.4 oz (76.8 kg)  Height: 5\' 6"  (1.676 m)   Gen: Pleasant, well-nourished, in no distress,  normal affect  ENT: No lesions,  mouth clear,  oropharynx clear, no postnasal drip  Neck: No JVD, no stridor  Lungs: No use of accessory muscles, no crackles or wheezing on normal respiration, no wheeze on forced expiration  Cardiovascular: RRR, heart sounds normal, no murmur or gallops, no peripheral edema  Musculoskeletal: No deformities, no cyanosis or clubbing  Neuro: alert, awake, non focal  Skin: Warm, no lesions or rash      Assessment & Plan:  COPD (chronic obstructive pulmonary disease) (HCC) Suspected diagnosis.  No pulmonary function testing so far.  Would consider doing this going forward.  She has albuterol, has not tried using it.  Asked her to take it, keep track with his beneficial.  If so we may consider starting scheduled BD therapy.  Dysphagia Feels like food may be getting stuck part way down her esophagus, does ultimately transit.  She complains of burning, increased GERD symptoms.  At risk for esophagitis given her treatment regimen.  I will let radiation oncology know that she is having symptoms.   Baltazar Apo, MD, PhD 08/20/2021, 10:47 AM [facility] [phone] or if no answer before 7:00PM call [phone] For any issues after 7:00PM please call eLink [phone]

## 2021-08-20 NOTE — Telephone Encounter (Addendum)
A Randomized pragmatic Chair-Based Home Exercise Intervention for Mitigating Cancer-Related Fatigue in Older Adults Undergoing Chemotherapy for Advanced Disease  08/20/21 - Week 1 Coaching Call  2:30PM: Called patient to complete week 1 coaching telephone call.  Patient states she has not been able to start performing the exercise program yet.  She reports having family here for the holiday as well as symptoms of acid reflux and diarrhea have prevented her from completing the exercises.  She reports that she intends to start performing the exercises this week.    The patient denied questions at this time and states she will reach out with any questions or concerns.  She was informed to expect next coaching call in 2 weeks on approximately 09/02/2021.  [name] Clinical Research Coordinator I  08/20/21  2:42 PM

## 2021-08-20 NOTE — Patient Instructions (Signed)
Continue to follow with oncology and radiation oncology. Dr. Lamonte Sakai will notify Dr. Sondra Come that you have been having reflux symptoms, some difficulty with swallowing Try using your albuterol 2 puffs if you need it for shortness of breath, chest tightness, wheezing.  Keep track of whether this medication helps you.  If so we can consider trying an every day scheduled inhaler medication in the future. Follow with Dr Lamonte Sakai in 6 months or sooner if you have any problems

## 2021-08-20 NOTE — Assessment & Plan Note (Signed)
Suspected diagnosis.  No pulmonary function testing so far.  Would consider doing this going forward.  She has albuterol, has not tried using it.  Asked her to take it, keep track with his beneficial.  If so we may consider starting scheduled BD therapy.

## 2021-08-21 ENCOUNTER — Ambulatory Visit
Admission: RE | Admit: 2021-08-21 | Discharge: 2021-08-21 | Disposition: A | Discharge status: Home / Self Care | Payer: PPO | Source: Ambulatory Visit | Attending: Radiation Oncology | Admitting: Radiation Oncology

## 2021-08-21 ENCOUNTER — Inpatient Hospital Stay: Payer: PPO

## 2021-08-21 VITALS — BP 160/70 | HR 75 | Temp 98.2°F | Resp 18 | Wt 168.4 lb

## 2021-08-21 DIAGNOSIS — C3490 Malignant neoplasm of unspecified part of unspecified bronchus or lung: Secondary | ICD-10-CM

## 2021-08-21 DIAGNOSIS — C3432 Malignant neoplasm of lower lobe, left bronchus or lung: Secondary | ICD-10-CM | POA: Diagnosis not present

## 2021-08-21 DIAGNOSIS — Z5111 Encounter for antineoplastic chemotherapy: Secondary | ICD-10-CM | POA: Diagnosis not present

## 2021-08-21 LAB — CBC WITH DIFFERENTIAL (CANCER CENTER ONLY)
Abs Immature Granulocytes: 0.01 10*3/uL (ref 0.00–0.07)
Basophils Absolute: 0 10*3/uL (ref 0.0–0.1)
Basophils Relative: 1 %
Eosinophils Absolute: 0.1 10*3/uL (ref 0.0–0.5)
Eosinophils Relative: 3 %
HCT: 33.7 % — ABNORMAL LOW (ref 36.0–46.0)
Hemoglobin: 10.4 g/dL — ABNORMAL LOW (ref 12.0–15.0)
Immature Granulocytes: 0 %
Lymphocytes Relative: 7 %
Lymphs Abs: 0.3 10*3/uL — ABNORMAL LOW (ref 0.7–4.0)
MCH: 29.3 pg (ref 26.0–34.0)
MCHC: 30.9 g/dL (ref 30.0–36.0)
MCV: 94.9 fL (ref 80.0–100.0)
Monocytes Absolute: 0.4 10*3/uL (ref 0.1–1.0)
Monocytes Relative: 10 %
Neutro Abs: 2.9 10*3/uL (ref 1.7–7.7)
Neutrophils Relative %: 79 %
Platelet Count: 207 10*3/uL (ref 150–400)
RBC: 3.55 MIL/uL — ABNORMAL LOW (ref 3.87–5.11)
RDW: 13.8 % (ref 11.5–15.5)
WBC Count: 3.6 10*3/uL — ABNORMAL LOW (ref 4.0–10.5)
nRBC: 0 % (ref 0.0–0.2)

## 2021-08-21 LAB — CMP (CANCER CENTER ONLY)
ALT: 13 U/L (ref 0–44)
AST: 13 U/L — ABNORMAL LOW (ref 15–41)
Albumin: 3.9 g/dL (ref 3.5–5.0)
Alkaline Phosphatase: 74 U/L (ref 38–126)
Anion gap: 8 (ref 5–15)
BUN: 33 mg/dL — ABNORMAL HIGH (ref 8–23)
CO2: 25 mmol/L (ref 22–32)
Calcium: 8.9 mg/dL (ref 8.9–10.3)
Chloride: 107 mmol/L (ref 98–111)
Creatinine: 1.66 mg/dL — ABNORMAL HIGH (ref 0.44–1.00)
GFR, Estimated: 30 mL/min — ABNORMAL LOW (ref >60)
Glucose, Bld: 119 mg/dL — ABNORMAL HIGH (ref 70–99)
Potassium: 4.2 mmol/L (ref 3.5–5.1)
Sodium: 140 mmol/L (ref 135–145)
Total Bilirubin: 0.3 mg/dL (ref 0.3–1.2)
Total Protein: 7.4 g/dL (ref 6.5–8.1)

## 2021-08-21 MED ORDER — PALONOSETRON HCL INJECTION 0.25 MG/5ML
0.2500 mg | Freq: Once | INTRAVENOUS | Status: AC
  Administered 2021-08-21: 11:00:00 0.25 mg via INTRAVENOUS
  Filled 2021-08-21: qty 5

## 2021-08-21 MED ORDER — SODIUM CHLORIDE 0.9 % IV SOLN
45.0000 mg/m2 | Freq: Once | INTRAVENOUS | Status: AC
  Administered 2021-08-21: 13:00:00 84 mg via INTRAVENOUS
  Filled 2021-08-21: qty 14

## 2021-08-21 MED ORDER — DIPHENHYDRAMINE HCL 50 MG/ML IJ SOLN
50.0000 mg | Freq: Once | INTRAMUSCULAR | Status: AC
  Administered 2021-08-21: 11:00:00 50 mg via INTRAVENOUS
  Filled 2021-08-21: qty 1

## 2021-08-21 MED ORDER — SODIUM CHLORIDE 0.9 % IV SOLN
123.4000 mg | Freq: Once | INTRAVENOUS | Status: AC
  Administered 2021-08-21: 14:00:00 120 mg via INTRAVENOUS
  Filled 2021-08-21: qty 12

## 2021-08-21 MED ORDER — SODIUM CHLORIDE 0.9 % IV SOLN: Freq: Once | INTRAVENOUS | Status: AC

## 2021-08-21 MED ORDER — FAMOTIDINE 20 MG IN NS 100 ML IVPB
20.0000 mg | Freq: Once | INTRAVENOUS | Status: AC
  Administered 2021-08-21: 11:00:00 20 mg via INTRAVENOUS
  Filled 2021-08-21: qty 100

## 2021-08-21 MED ORDER — SODIUM CHLORIDE 0.9 % IV SOLN
10.0000 mg | Freq: Once | INTRAVENOUS | Status: AC
  Administered 2021-08-21: 12:00:00 10 mg via INTRAVENOUS
  Filled 2021-08-21: qty 10

## 2021-08-21 NOTE — Progress Notes (Signed)
Per Cassandra Heilingoetter, PA okay to proceed with elevated SCr.

## 2021-08-21 NOTE — Patient Instructions (Signed)
[facility] ONCOLOGY  Discharge Instructions: Thank you for choosing [facility] to provide your oncology and hematology care.   If you have a lab appointment with the [facility], please go directly to the [facility] and check in at the registration area.   Wear comfortable clothing and clothing appropriate for easy access to any Portacath or PICC line.   We strive to give you quality time with your provider. You may need to reschedule your appointment if you arrive late (15 or more minutes).  Arriving late affects you and other patients whose appointments are after yours.  Also, if you miss three or more appointments without notifying the office, you may be dismissed from the clinic at the providers discretion.      For prescription refill requests, have your pharmacy contact our office and allow 72 hours for refills to be completed.    Today you received the following chemotherapy and/or immunotherapy agents: Taxol/Carbo.      To help prevent nausea and vomiting after your treatment, we encourage you to take your nausea medication as directed.  BELOW ARE SYMPTOMS THAT SHOULD BE REPORTED IMMEDIATELY: *FEVER GREATER THAN 100.4 F (38 C) OR HIGHER *CHILLS OR SWEATING *NAUSEA AND VOMITING THAT IS NOT CONTROLLED WITH YOUR NAUSEA MEDICATION *UNUSUAL SHORTNESS OF BREATH *UNUSUAL BRUISING OR BLEEDING *URINARY PROBLEMS (pain or burning when urinating, or frequent urination) *BOWEL PROBLEMS (unusual diarrhea, constipation, pain near the anus) TENDERNESS IN MOUTH AND THROAT WITH OR WITHOUT PRESENCE OF ULCERS (sore throat, sores in mouth, or a toothache) UNUSUAL RASH, SWELLING OR PAIN  UNUSUAL VAGINAL DISCHARGE OR ITCHING   Items with * indicate a potential emergency and should be followed up as soon as possible or go to the Emergency Department if any problems should occur.  Please show the CHEMOTHERAPY ALERT CARD or IMMUNOTHERAPY ALERT CARD at check-in  to the Emergency Department and triage nurse.  Should you have questions after your visit or need to cancel or reschedule your appointment, please contact [facility]  Dept: [phone]  and follow the prompts.  Office hours are 8:00 a.m. to 4:30 p.m. Monday - Friday. Please note that voicemails left after 4:00 p.m. may not be returned until the following business day.  We are closed weekends and major holidays. You have access to a nurse at all times for urgent questions. Please call the main number to the clinic Dept: [phone] and follow the prompts.   For any non-urgent questions, you may also contact your provider using MyChart. We now offer e-Visits for anyone 31 and older to request care online for non-urgent symptoms. For details visit [email or web address].   Also download the MyChart app! Go to the app store, search "MyChart", open the app, select [facility], and log in with your MyChart username and password.  Due to Covid, a mask is required upon entering the hospital/clinic. If you do not have a mask, one will be given to you upon arrival. For doctor visits, patients may have 1 support person aged 53 or older with them. For treatment visits, patients cannot have anyone with them due to current Covid guidelines and our immunocompromised population.

## 2021-08-22 ENCOUNTER — Ambulatory Visit
Admission: RE | Admit: 2021-08-22 | Discharge: 2021-08-22 | Disposition: A | Discharge status: Home / Self Care | Payer: PPO | Source: Ambulatory Visit | Attending: Radiation Oncology | Admitting: Radiation Oncology

## 2021-08-22 ENCOUNTER — Other Ambulatory Visit: Payer: Self-pay

## 2021-08-22 DIAGNOSIS — C3432 Malignant neoplasm of lower lobe, left bronchus or lung: Secondary | ICD-10-CM | POA: Diagnosis not present

## 2021-08-22 DIAGNOSIS — Z5111 Encounter for antineoplastic chemotherapy: Secondary | ICD-10-CM | POA: Diagnosis not present

## 2021-08-23 ENCOUNTER — Ambulatory Visit
Admission: RE | Admit: 2021-08-23 | Discharge: 2021-08-23 | Disposition: A | Discharge status: Home / Self Care | Payer: PPO | Source: Ambulatory Visit | Attending: Radiation Oncology | Admitting: Radiation Oncology

## 2021-08-23 DIAGNOSIS — Z5111 Encounter for antineoplastic chemotherapy: Secondary | ICD-10-CM | POA: Diagnosis not present

## 2021-08-23 DIAGNOSIS — C3432 Malignant neoplasm of lower lobe, left bronchus or lung: Secondary | ICD-10-CM | POA: Diagnosis not present

## 2021-08-27 ENCOUNTER — Telehealth: Payer: Self-pay | Admitting: *Deleted

## 2021-08-27 ENCOUNTER — Ambulatory Visit
Admission: RE | Admit: 2021-08-27 | Discharge: 2021-08-27 | Disposition: A | Discharge status: Home / Self Care | Payer: PPO | Source: Ambulatory Visit | Attending: Radiation Oncology | Admitting: Radiation Oncology

## 2021-08-27 ENCOUNTER — Other Ambulatory Visit: Payer: Self-pay

## 2021-08-27 DIAGNOSIS — Z510 Encounter for antineoplastic radiation therapy: Secondary | ICD-10-CM | POA: Diagnosis not present

## 2021-08-27 DIAGNOSIS — C3432 Malignant neoplasm of lower lobe, left bronchus or lung: Secondary | ICD-10-CM | POA: Diagnosis not present

## 2021-08-27 DIAGNOSIS — R7303 Prediabetes: Secondary | ICD-10-CM | POA: Diagnosis not present

## 2021-08-27 DIAGNOSIS — Z5111 Encounter for antineoplastic chemotherapy: Secondary | ICD-10-CM | POA: Insufficient documentation

## 2021-08-27 DIAGNOSIS — L602 Onychogryphosis: Secondary | ICD-10-CM | POA: Diagnosis not present

## 2021-08-27 MED FILL — Dexamethasone Sodium Phosphate Inj 100 MG/10ML: INTRAMUSCULAR | Qty: 1 | Status: AC

## 2021-08-27 NOTE — Progress Notes (Signed)
[facility] OFFICE PROGRESS NOTE  Abigail [name], [name] [address]  DIAGNOSIS: stage IIIa (T2a, N2, M0) non-small cell lung cancer, adenocarcinoma presented with left lower lobe lung mass in addition to left hilar and mediastinal lymphadenopathy diagnosed in November 2022 with no actionable mutation on the guardant blood test  PRIOR THERAPY: None  CURRENT THERAPY: Weekly concurrent chemoradiation with carboplatin for an AUC of 2 and paclitaxel 45 mg per metered squared.  First dose on 08/05/2021.  Status post 3 cycles.  INTERVAL HISTORY: Abigail [name] 86 y.o. female returns to the clinic today for a follow-up visit. The patient is currently undergoing concurrent chemoradiation.  She is tolerating the chemotherapy portion of her treatment well except food taste alterations. She is having some esophagitis from radiation. She is using nexium which reportedly helps her. She was given carafate but has not used this yet. Her weight has stayed fairly stable, thus far. She has some fatigue for which she is not very active at home. She denies any fever, chills, or night sweats.  She denies any chest pain or hemoptysis.  She reports dyspnea with certain activities such as showering. She reports some phlegm/cough. She has not tried taking anything. She denies nausea/vomiting. She reportedly takes her compazine though at any sign of possible nausea. She is requesting a refill.  She previously was endorsing constipation states she has been taking colace which on some occassions has caused loose stool.  She denies any headache or visual changes.  Her last day radiation is scheduled for 09/17/2021.  She is here today for evaluation before starting cycle 4 treatment.  MEDICAL HISTORY: Past Medical History:  Diagnosis Date   Anemia    Arthritis    knees, back   Carotid artery occlusion    Chronic kidney disease    stage 3 ckd no nephrologist   Complete uterine  prolapse with prolapse of anterior vaginal wall    Complication of anesthesia    hard to wake up per pt   Constipation    Coronary artery disease    Diverticulitis yrs ago coialitis   Dyspnea    History of blood transfusion    Hypertension    Hypothyroid    Numbness    in hands at times   Pneumonia    Pre-diabetes    Scoliosis    STEMI (ST elevation myocardial infarction) ([facility]) 10/26/2019   DES RCA   Wears dentures    full dentures   Wears glasses    for reading    ALLERGIES:  is allergic to codeine and norvasc [amlodipine].  MEDICATIONS:  Current Outpatient Medications  Medication Sig Dispense Refill   acetaminophen (TYLENOL) 325 MG tablet Take 2 tablets (650 mg total) by mouth every 6 (six) hours as needed for mild pain (or Fever >/= 101).     albuterol (VENTOLIN HFA) 108 (90 Base) MCG/ACT inhaler Inhale 2 puffs into the lungs every 6 (six) hours as needed for wheezing or shortness of breath. 8 g 0   aspirin EC 81 MG tablet Take 81 mg by mouth at bedtime.     atorvastatin (LIPITOR) 80 MG tablet Take 1 tablet (80 mg total) by mouth daily at 6 PM. 90 tablet 3   Cholecalciferol (VITAMIN D3 PO) Take 2 capsules by mouth daily.     Cranberry 1000 MG CAPS Take 1,000 mg by mouth daily at 12 noon.     cyclobenzaprine (FLEXERIL) 10 MG tablet Take  1 tablet (10 mg total) by mouth 3 (three) times daily as needed for muscle spasms. 30 tablet 1   docusate sodium (COLACE) 100 MG capsule Take 100-200 mg by mouth See admin instructions. Take 100 mg in the morning and 200 mg at bedtime     estradiol (ESTRACE) 0.1 MG/GM vaginal cream Place 0.5 g vaginally once a week. Place 0.5g twice a week at opening of vagina     ezetimibe (ZETIA) 10 MG tablet Take 1 tablet (10 mg total) by mouth daily. 90 tablet 3   ferrous sulfate 300 (60 Fe) MG/5ML syrup Take 5 mLs (300 mg total) by mouth 2 (two) times daily with a meal. 300 mL 11   levothyroxine (SYNTHROID) 175 MCG tablet TAKE 1 TABLET BY MOUTH EVERY  MORNING ON MONDAY - FRIDAY 90 tablet 1   lisinopril-hydrochlorothiazide (ZESTORETIC) 20-25 MG tablet Take 0.5 tablets by mouth 3 (three) times a week. Take on Monday Wednesday and Friday only 90 tablet 1   nitroGLYCERIN (NITROSTAT) 0.4 MG SL tablet Place 1 tablet (0.4 mg total) under the tongue every 5 (five) minutes x 3 doses as needed for chest pain. 25 tablet 3   prochlorperazine (COMPAZINE) 10 MG tablet Take 1 tablet (10 mg total) by mouth every 6 (six) hours as needed for nausea or vomiting. 30 tablet 2   sucralfate (CARAFATE) 1 g tablet Take 1 tablet (1 g total) by mouth 4 (four) times daily -  with meals and at bedtime. Crush and dissolve in 10 mL of warm water, swallow prior to meals 120 tablet 1   sulfamethoxazole-trimethoprim (BACTRIM) 400-80 MG tablet Take 0.5 tablets by mouth daily. 30 tablet 5   Trospium Chloride 60 MG CP24 TAKE 1 CAPSULE BY MOUTH EVERY DAY 30 capsule 5   No current facility-administered medications for this visit.    SURGICAL HISTORY:  Past Surgical History:  Procedure Laterality Date   ANTERIOR AND POSTERIOR REPAIR WITH SACROSPINOUS FIXATION N/A 12/20/2020   Procedure: SACROSPINOUS LIGAMENT FIXATION;  Surgeon: [name], MD;  Location: [facility];  Service: Gynecology;  Laterality: N/A;  Total time requested for all procedures is 2 hours   Gantt age 24   lower   bartholin cyst removal  age 42's   BLADDER SUSPENSION N/A 12/20/2020   Procedure: TRANSVAGINAL TAPE (TVT) PROCEDURE;  Surgeon: [name], MD;  Location: [facility];  Service: Gynecology;  Laterality: N/A;   BRONCHIAL BRUSHINGS  07/15/2021   Procedure: BRONCHIAL BRUSHINGS;  Surgeon: Collene Gobble, MD;  Location: [facility] ENDOSCOPY;  Service: Pulmonary;;   BRONCHIAL NEEDLE ASPIRATION BIOPSY  07/15/2021   Procedure: BRONCHIAL NEEDLE ASPIRATION BIOPSIES;  Surgeon: Collene Gobble, MD;  Location: [facility];  Service: Pulmonary;;    CORONARY/GRAFT ACUTE MI REVASCULARIZATION N/A 10/26/2019   Procedure: Coronary/Graft Acute MI Revascularization;  Surgeon: Sherren Mocha, MD;  Location: [facility];  Service: Cardiovascular;  Laterality: N/A;   CYSTOCELE REPAIR N/A 12/20/2020   Procedure: ANTERIOR AND POSTERIOR REPAIR WITH PERINEORRHAPHY;  Surgeon: [name], MD;  Location: [facility];  Service: Gynecology;  Laterality: N/A;   CYSTOSCOPY N/A 12/20/2020   Procedure: CYSTOSCOPY;  Surgeon: [name], MD;  Location: [facility];  Service: Gynecology;  Laterality: N/A;   ELBOW SURGERY  1990's   left   ENDOBRONCHIAL ULTRASOUND N/A 07/15/2021   Procedure: ENDOBRONCHIAL ULTRASOUND;  Surgeon: Collene Gobble, MD;  Location: [facility] ENDOSCOPY;  Service: Pulmonary;  Laterality: N/A;   HEMOSTASIS CONTROL  07/15/2021   Procedure: HEMOSTASIS CONTROL;  Surgeon: Collene Gobble, MD;  Location: [facility] ENDOSCOPY;  Service: Pulmonary;;   LEFT HEART CATH AND CORONARY ANGIOGRAPHY N/A 10/26/2019   Procedure: LEFT HEART CATH AND CORONARY ANGIOGRAPHY;  Surgeon: Sherren Mocha, MD;  Location: [facility];  Service: Cardiovascular;  Laterality: N/A;   TONSILLECTOMY      REVIEW OF SYSTEMS:   Review of Systems  Constitutional: Positive for fatigue and decreased appetite.  Negative for chills and fever.   HENT: Positive for some odynophagia.  Negative for mouth sores, nosebleeds, sore throat and trouble swallowing.   Eyes: Negative for eye problems and icterus.  Respiratory: Positive for dyspnea, especially with showering. Positive for cough. Negative for hemoptysis and wheezing.   Cardiovascular: Negative for chest pain and leg swelling.  Gastrointestinal: Positive for occasional constipation. Negative for abdominal pain,  diarrhea, nausea and vomiting.  Genitourinary: Negative for bladder incontinence, difficulty urinating, dysuria, frequency and hematuria.   Musculoskeletal: Negative  for back pain, gait problem, neck pain and neck stiffness.  Skin: Negative for itching and rash.  Neurological: Negative for dizziness, extremity weakness, gait problem, headaches, light-headedness and seizures.  Hematological: Negative for adenopathy. Does not bruise/bleed easily.  Psychiatric/Behavioral: Negative for confusion, depression and sleep disturbance. The patient is not nervous/anxious.     PHYSICAL EXAMINATION:  Blood pressure 140/62, pulse 74, temperature (!) 97.5 F (36.4 C), temperature source Temporal, resp. rate 17, height 5\' 6"  (1.676 m), weight 167 lb 14.4 oz (76.2 kg), SpO2 95 %.  ECOG PERFORMANCE STATUS: 2-3  Physical Exam  Constitutional: Oriented to person, place, and time and well-developed, well-nourished, and in no distress.  HENT:  Head: Normocephalic and atraumatic.  Mouth/Throat: Oropharynx is clear and moist. No oropharyngeal exudate.  Eyes: Conjunctivae are normal. Right eye exhibits no discharge. Left eye exhibits no discharge. No scleral icterus.  Neck: Normal range of motion. Neck supple.  Cardiovascular: Normal rate, regular rhythm, normal heart sounds and intact distal pulses.   Pulmonary/Chest: Effort normal and breath sounds normal. No respiratory distress. No wheezes. No rales.  Abdominal: Soft. Bowel sounds are normal. Exhibits no distension and no mass. There is no tenderness.  Musculoskeletal: Normal range of motion. Exhibits no edema.  Lymphadenopathy:    No cervical adenopathy.  Neurological: Alert and oriented to person, place, and time. Exhibits muscle wasting.  Examined in the wheelchair.  Skin: Skin is warm and dry. No rash noted. Not diaphoretic. No erythema. No pallor.  Psychiatric: Mood, memory and judgment normal.  Vitals reviewed.  LABORATORY DATA: Lab Results  Component Value Date   WBC 3.2 (L) 08/28/2021   HGB 9.8 (L) 08/28/2021   HCT 30.5 (L) 08/28/2021   MCV 93.3 08/28/2021   PLT 148 (L) 08/28/2021      Chemistry       Component Value Date/Time   NA 136 08/28/2021 1250   NA 137 07/10/2021 1118   K 4.3 08/28/2021 1250   CL 104 08/28/2021 1250   CO2 24 08/28/2021 1250   BUN 23 08/28/2021 1250   BUN 23 07/10/2021 1118   CREATININE 1.60 (H) 08/28/2021 1250   CREATININE 1.08 04/18/2014 1516   GLU 104 12/23/2017 1111      Component Value Date/Time   CALCIUM 8.9 08/28/2021 1250   ALKPHOS 73 08/28/2021 1250   AST 13 (L) 08/28/2021 1250   ALT 10 08/28/2021 1250   BILITOT 0.3 08/28/2021 1250  RADIOGRAPHIC STUDIES:  No results found.   ASSESSMENT/PLAN:  This is a very pleasant 86 year old Caucasian female diagnosed with stage IIIa (T2a, N2, M0) non-small cell lung cancer, adenocarcinoma.  The patient presented with a left lower lobe lung mass in addition to left hilar mediastinal lymphadenopathy.  She was diagnosed in November 2022.  The patient did not have any actionable mutations by guardant 360 blood test.   The patient is currently undergoing concurrent chemoradiation with carboplatin for an AUC of 2 and paclitaxel 45 mg per metered squared.  She status post 3 cycles and tolerated it well. Her last day radiation is scheduled for 09/17/2020  Labs were reviewed.  Recommend that she proceed with cycle #4 today scheduled.  We will see her back for follow-up visit in 2 weeks for evaluation before starting cycle #6.  We discussed the patient's taste alterations.  She is previous encouraged to use Biotene and salt water rinses.  Discussed that sometimes patients may need to make certain foods to eat and find things that taste appealing to them while undergoing treatment.  The patient mentioned to me that she would not mind losing weight.  I discussed the importance of good nutrition while undergoing treatment as sometimes the radiation-induced esophagitis can get worse towards the end of treatment.  Discussed that I do want her to be conscientious to drink plenty of fluid and eat while she is still able  to as some patients can feel very poorly with lightheadedness and dehydration towards the end of treatment if they are not getting good nutrition.  I have refilled her Compazine today. I will place an order for 500 ml of IVF today with her treatment.   Advised her to use Carafate if needed for her odynophagia.  If she needs a cough suppressant, encouraged her to pick up Delsym over-the-counter.  Discussed if she needs to take Mucinex for phlegm, she may do so.  The patient was advised to call immediately if she has any concerning symptoms in the interval. The patient voices understanding of current disease status and treatment options and is in agreement with the current care plan. All questions were answered. The patient knows to call the clinic with any problems, questions or concerns. We can certainly see the patient much sooner if necessary    No orders of the defined types were placed in this encounter.    The total time spent in the appointment was 20-29 minutes.   Brenetta Penny L Juliya Magill, PA-C 08/28/21

## 2021-08-27 NOTE — Telephone Encounter (Signed)
CALLED PATIENT TO INFORM OF APPT. WITH PODIATRIST- DR. REGAL ON 09-23-21- ARRIVAL TIME- 10:45 AM - ADDRESS- [address] SPOKE WITH PATIENT AND SHE IS AWARE OF THIS APPT.

## 2021-08-28 ENCOUNTER — Encounter: Payer: Self-pay | Admitting: Nurse Practitioner

## 2021-08-28 ENCOUNTER — Inpatient Hospital Stay: Payer: PPO | Admitting: Physician Assistant

## 2021-08-28 ENCOUNTER — Other Ambulatory Visit: Payer: PPO

## 2021-08-28 ENCOUNTER — Inpatient Hospital Stay: Payer: PPO

## 2021-08-28 ENCOUNTER — Encounter: Payer: Self-pay | Admitting: *Deleted

## 2021-08-28 ENCOUNTER — Ambulatory Visit (INDEPENDENT_AMBULATORY_CARE_PROVIDER_SITE_OTHER): Payer: PPO | Admitting: Nurse Practitioner

## 2021-08-28 ENCOUNTER — Telehealth: Payer: Self-pay | Admitting: *Deleted

## 2021-08-28 ENCOUNTER — Ambulatory Visit
Admission: RE | Admit: 2021-08-28 | Discharge: 2021-08-28 | Disposition: A | Discharge status: Home / Self Care | Payer: PPO | Source: Ambulatory Visit | Attending: Radiation Oncology | Admitting: Radiation Oncology

## 2021-08-28 VITALS — BP 140/62 | HR 74 | Temp 97.5°F | Resp 17 | Ht 66.0 in | Wt 167.9 lb

## 2021-08-28 VITALS — BP 140/76 | Temp 98.3°F | Ht 66.0 in | Wt 166.4 lb

## 2021-08-28 DIAGNOSIS — I129 Hypertensive chronic kidney disease with stage 1 through stage 4 chronic kidney disease, or unspecified chronic kidney disease: Secondary | ICD-10-CM | POA: Insufficient documentation

## 2021-08-28 DIAGNOSIS — Z923 Personal history of irradiation: Secondary | ICD-10-CM | POA: Insufficient documentation

## 2021-08-28 DIAGNOSIS — R638 Other symptoms and signs concerning food and fluid intake: Secondary | ICD-10-CM | POA: Diagnosis not present

## 2021-08-28 DIAGNOSIS — I2510 Atherosclerotic heart disease of native coronary artery without angina pectoris: Secondary | ICD-10-CM | POA: Insufficient documentation

## 2021-08-28 DIAGNOSIS — C3490 Malignant neoplasm of unspecified part of unspecified bronchus or lung: Secondary | ICD-10-CM

## 2021-08-28 DIAGNOSIS — R1310 Dysphagia, unspecified: Secondary | ICD-10-CM | POA: Insufficient documentation

## 2021-08-28 DIAGNOSIS — E039 Hypothyroidism, unspecified: Secondary | ICD-10-CM | POA: Insufficient documentation

## 2021-08-28 DIAGNOSIS — C3432 Malignant neoplasm of lower lobe, left bronchus or lung: Secondary | ICD-10-CM | POA: Insufficient documentation

## 2021-08-28 DIAGNOSIS — Z5111 Encounter for antineoplastic chemotherapy: Secondary | ICD-10-CM | POA: Insufficient documentation

## 2021-08-28 DIAGNOSIS — N183 Chronic kidney disease, stage 3 unspecified: Secondary | ICD-10-CM | POA: Insufficient documentation

## 2021-08-28 DIAGNOSIS — Z510 Encounter for antineoplastic radiation therapy: Secondary | ICD-10-CM | POA: Diagnosis not present

## 2021-08-28 DIAGNOSIS — R7303 Prediabetes: Secondary | ICD-10-CM | POA: Diagnosis not present

## 2021-08-28 DIAGNOSIS — Z6826 Body mass index (BMI) 26.0-26.9, adult: Secondary | ICD-10-CM | POA: Diagnosis not present

## 2021-08-28 LAB — CBC WITH DIFFERENTIAL (CANCER CENTER ONLY)
Abs Immature Granulocytes: 0.01 10*3/uL (ref 0.00–0.07)
Basophils Absolute: 0 10*3/uL (ref 0.0–0.1)
Basophils Relative: 0 %
Eosinophils Absolute: 0.2 10*3/uL (ref 0.0–0.5)
Eosinophils Relative: 7 %
HCT: 30.5 % — ABNORMAL LOW (ref 36.0–46.0)
Hemoglobin: 9.8 g/dL — ABNORMAL LOW (ref 12.0–15.0)
Immature Granulocytes: 0 %
Lymphocytes Relative: 5 %
Lymphs Abs: 0.2 10*3/uL — ABNORMAL LOW (ref 0.7–4.0)
MCH: 30 pg (ref 26.0–34.0)
MCHC: 32.1 g/dL (ref 30.0–36.0)
MCV: 93.3 fL (ref 80.0–100.0)
Monocytes Absolute: 0.3 10*3/uL (ref 0.1–1.0)
Monocytes Relative: 10 %
Neutro Abs: 2.5 10*3/uL (ref 1.7–7.7)
Neutrophils Relative %: 78 %
Platelet Count: 148 10*3/uL — ABNORMAL LOW (ref 150–400)
RBC: 3.27 MIL/uL — ABNORMAL LOW (ref 3.87–5.11)
RDW: 14.1 % (ref 11.5–15.5)
WBC Count: 3.2 10*3/uL — ABNORMAL LOW (ref 4.0–10.5)
nRBC: 0 % (ref 0.0–0.2)

## 2021-08-28 LAB — CMP (CANCER CENTER ONLY)
ALT: 10 U/L (ref 0–44)
AST: 13 U/L — ABNORMAL LOW (ref 15–41)
Albumin: 3.8 g/dL (ref 3.5–5.0)
Alkaline Phosphatase: 73 U/L (ref 38–126)
Anion gap: 8 (ref 5–15)
BUN: 23 mg/dL (ref 8–23)
CO2: 24 mmol/L (ref 22–32)
Calcium: 8.9 mg/dL (ref 8.9–10.3)
Chloride: 104 mmol/L (ref 98–111)
Creatinine: 1.6 mg/dL — ABNORMAL HIGH (ref 0.44–1.00)
GFR, Estimated: 32 mL/min — ABNORMAL LOW (ref >60)
Glucose, Bld: 164 mg/dL — ABNORMAL HIGH (ref 70–99)
Potassium: 4.3 mmol/L (ref 3.5–5.1)
Sodium: 136 mmol/L (ref 135–145)
Total Bilirubin: 0.3 mg/dL (ref 0.3–1.2)
Total Protein: 7.4 g/dL (ref 6.5–8.1)

## 2021-08-28 MED ORDER — PALONOSETRON HCL INJECTION 0.25 MG/5ML
0.2500 mg | Freq: Once | INTRAVENOUS | Status: AC
  Administered 2021-08-28: 0.25 mg via INTRAVENOUS
  Filled 2021-08-28: qty 5

## 2021-08-28 MED ORDER — FAMOTIDINE 20 MG IN NS 100 ML IVPB
20.0000 mg | Freq: Once | INTRAVENOUS | Status: AC
  Administered 2021-08-28: 20 mg via INTRAVENOUS
  Filled 2021-08-28: qty 100

## 2021-08-28 MED ORDER — SODIUM CHLORIDE 0.9 % IV SOLN: INTRAVENOUS | Status: DC

## 2021-08-28 MED ORDER — SODIUM CHLORIDE 0.9 % IV SOLN
123.4000 mg | Freq: Once | INTRAVENOUS | Status: AC
  Administered 2021-08-28: 120 mg via INTRAVENOUS
  Filled 2021-08-28: qty 12

## 2021-08-28 MED ORDER — DIPHENHYDRAMINE HCL 50 MG/ML IJ SOLN
50.0000 mg | Freq: Once | INTRAMUSCULAR | Status: AC
  Administered 2021-08-28: 50 mg via INTRAVENOUS
  Filled 2021-08-28: qty 1

## 2021-08-28 MED ORDER — SODIUM CHLORIDE 0.9 % IV SOLN
45.0000 mg/m2 | Freq: Once | INTRAVENOUS | Status: AC
  Administered 2021-08-28: 84 mg via INTRAVENOUS
  Filled 2021-08-28: qty 14

## 2021-08-28 MED ORDER — PROCHLORPERAZINE MALEATE 10 MG PO TABS: 10.0000 mg | ORAL_TABLET | Freq: Four times a day (QID) | ORAL | 2 refills | Status: DC | PRN

## 2021-08-28 MED ORDER — SODIUM CHLORIDE 0.9 % IV SOLN
10.0000 mg | Freq: Once | INTRAVENOUS | Status: AC
  Administered 2021-08-28: 10 mg via INTRAVENOUS
  Filled 2021-08-28: qty 10

## 2021-08-28 MED ORDER — SODIUM CHLORIDE 0.9 % IV SOLN: Freq: Once | INTRAVENOUS | Status: AC

## 2021-08-28 NOTE — Patient Instructions (Signed)
[facility]  Discharge Instructions: Thank you for choosing [facility] to provide your oncology and hematology care.   If you have a lab appointment with the Cancer Center, please go directly to the Cancer Center and check in at the registration area.   Wear comfortable clothing and clothing appropriate for easy access to any Portacath or PICC line.   We strive to give you quality time with your provider. You may need to reschedule your appointment if you arrive late (15 or more minutes).  Arriving late affects you and other patients whose appointments are after yours.  Also, if you miss three or more appointments without notifying the office, you may be dismissed from the clinic at the provider's discretion.      For prescription refill requests, have your pharmacy contact our office and allow 72 hours for refills to be completed.    Today you received the following chemotherapy and/or immunotherapy agents: Paclitaxel (Taxol) and Carboplatin.   To help prevent nausea and vomiting after your treatment, we encourage you to take your nausea medication as directed.  BELOW ARE SYMPTOMS THAT SHOULD BE REPORTED IMMEDIATELY: *FEVER GREATER THAN 100.4 F (38 C) OR HIGHER *CHILLS OR SWEATING *NAUSEA AND VOMITING THAT IS NOT CONTROLLED WITH YOUR NAUSEA MEDICATION *UNUSUAL SHORTNESS OF BREATH *UNUSUAL BRUISING OR BLEEDING *URINARY PROBLEMS (pain or burning when urinating, or frequent urination) *BOWEL PROBLEMS (unusual diarrhea, constipation, pain near the anus) TENDERNESS IN MOUTH AND THROAT WITH OR WITHOUT PRESENCE OF ULCERS (sore throat, sores in mouth, or a toothache) UNUSUAL RASH, SWELLING OR PAIN  UNUSUAL VAGINAL DISCHARGE OR ITCHING   Items with * indicate a potential emergency and should be followed up as soon as possible or go to the Emergency Department if any problems should occur.  Please show the CHEMOTHERAPY ALERT CARD or IMMUNOTHERAPY  ALERT CARD at check-in to the Emergency Department and triage nurse.  Should you have questions after your visit or need to cancel or reschedule your appointment, please contact [facility]  Dept: [phone]  and follow the prompts.  Office hours are 8:00 a.m. to 4:30 p.m. Monday - Friday. Please note that voicemails left after 4:00 p.m. may not be returned until the following business day.  We are closed weekends and major holidays. You have access to a nurse at all times for urgent questions. Please call the main number to the clinic Dept: [phone] and follow the prompts.   For any non-urgent questions, you may also contact your provider using MyChart. We now offer e-Visits for anyone 18 and older to request care online for non-urgent symptoms. For details visit [email or web address].   Also download the MyChart app! Go to the app store, search "MyChart", open the app, select [facility], and log in with your MyChart username and password.  Due to Covid, a mask is required upon entering the hospital/clinic. If you do not have a mask, one will be given to you upon arrival. For doctor visits, patients may have 1 support person aged 18 or older with them. For treatment visits, patients cannot have anyone with them due to current Covid guidelines and our immunocompromised population.   

## 2021-08-28 NOTE — Progress Notes (Signed)
Oncology Nurse Navigator Documentation  Oncology Nurse Navigator Flowsheets 08/28/2021 08/09/2021 07/26/2021 07/19/2021  Abnormal Finding Date - - - 04/15/2021  Confirmed Diagnosis Date - - - 07/15/2021  Diagnosis Status - - Confirmed Diagnosis Complete -  Planned Course of Treatment - - Chemo/Radiation Concurrent -  Phase of Treatment - Chemo Radiation -  Chemotherapy Actual Start Date: - 08/05/2021 - -  Navigator Follow Up Date: - 08/28/2021 07/30/2021 07/25/2021  Navigator Follow Up Reason: - Follow-up Appointment Appointment Review New Patient Appointment  Navigator Location [place]  Navigator Encounter Type Telephone/I received a message from NP that was seeing [name] earlier today.  She updated me patient does not have transportation today and there was a mix up in time. I called [place] for an update on transportation.  She let me know that patient is re-scheduled for transportation today and will be picked up at 12:15. I called patient with this update. She was thankful for the help.  Appt/Treatment Plan Review Other: Telephone  Telephone Appt Confirmation/Clarification;Education - - Appt Confirmation/Clarification  Multidisiplinary Clinic Date - - - 07/25/2021  Multidisiplinary Clinic Type - - - Thoracic  Treatment Initiated Date - 07/30/2021 - -  Patient Visit Type Other Other - Other  Treatment Phase Treatment Treatment - Pre-Tx/Tx Discussion  Barriers/Navigation Needs Coordination of Care;Education Coordination of Care Coordination of Care Coordination of Care;Education  Education Transport During Treatment;Other - - Other  Interventions Coordination of Care;Education;Psycho-Social Support Coordination of Care Coordination of Care Coordination of Care;Education;Psycho-Social Support  Acuity Level 3-Moderate Needs (3-4 Barriers Identified) Level 2-Minimal Needs (1-2 Barriers Identified) Level 2-Minimal Needs (1-2 Barriers  Identified) Level 2-Minimal Needs (1-2 Barriers Identified)  Coordination of Care Other Other - Appts  Education Method Verbal - - Verbal  Time Spent with Patient [id number] [phone]

## 2021-08-28 NOTE — Progress Notes (Signed)
I,Yamilka J Llittleton,acting as a Education administrator for [employer], FNP.,have documented all relevant documentation on the behalf of [name], FNP,as directed by  [name], FNP while in the presence of [name], [place].   This visit occurred during the SARS-CoV-2 public health emergency.  Safety protocols were in place, including screening questions prior to the visit, additional usage of staff PPE, and extensive cleaning of exam room while observing appropriate contact time as indicated for disinfecting solutions.  Subjective:     Patient ID: Abigail [name] , female    DOB: 05-Oct-1935 , 86 y.o.   MRN: [id number]   Chief Complaint  Patient presents with   Hypertension    HPI  Patient presents today for a bp and thyroid. She has been taking nexium for the last week which has been helping.  She is doing some life review this visit.   Wt Readings from Last 3 Encounters: 08/28/21 : 166 lb 6.4 oz (75.5 kg) 08/21/21 : 168 lb 6.4 oz (76.4 kg) 08/20/21 : 169 lb 6.4 oz (76.8 kg)   She is getting 25 days of radiation and once a week chemo (she is to not drive and was to have a car to pick her up - they came this morning at 12 am)   Hypertension This is a chronic problem. The current episode started more than 1 year ago. The problem is unchanged. The problem is controlled. Pertinent negatives include no anxiety. Risk factors for coronary artery disease include sedentary lifestyle and stress. Past treatments include diuretics and ACE inhibitors. Compliance problems include exercise.  Hypertensive end-organ damage includes kidney disease. There is no history of angina. Identifiable causes of hypertension include chronic renal disease and a thyroid problem.  Thyroid Problem Presents for follow-up visit. Patient reports no cold intolerance, constipation, fatigue, tremors or weight loss. The symptoms have been stable.    Past Medical History:  Diagnosis Date   Anemia    Arthritis    knees, back    Carotid artery occlusion    Chronic kidney disease    stage 3 ckd no nephrologist   Complete uterine prolapse with prolapse of anterior vaginal wall    Complication of anesthesia    hard to wake up per pt   Constipation    Coronary artery disease    Diverticulitis yrs ago coialitis   Dyspnea    History of blood transfusion    Hypertension    Hypothyroid    Numbness    in hands at times   Pneumonia    Pre-diabetes    Scoliosis    STEMI (ST elevation myocardial infarction) ([place]) 10/26/2019   DES RCA   Wears dentures    full dentures   Wears glasses    for reading     Family History  Problem Relation Age of Onset   Hypertension Mother      Current Outpatient Medications:    acetaminophen (TYLENOL) 325 MG tablet, Take 2 tablets (650 mg total) by mouth every 6 (six) hours as needed for mild pain (or Fever >/= 101)., Disp: , Rfl:    albuterol (VENTOLIN HFA) 108 (90 Base) MCG/ACT inhaler, Inhale 2 puffs into the lungs every 6 (six) hours as needed for wheezing or shortness of breath., Disp: 8 g, Rfl: 0   aspirin EC 81 MG tablet, Take 81 mg by mouth at bedtime., Disp: , Rfl:    atorvastatin (LIPITOR) 80 MG tablet, Take 1 tablet (80 mg total) by mouth daily at  6 PM., Disp: 90 tablet, Rfl: 3   Cholecalciferol (VITAMIN D3 PO), Take 2 capsules by mouth daily., Disp: , Rfl:    Cranberry 1000 MG CAPS, Take 1,000 mg by mouth daily at 12 noon., Disp: , Rfl:    cyclobenzaprine (FLEXERIL) 10 MG tablet, Take 1 tablet (10 mg total) by mouth 3 (three) times daily as needed for muscle spasms., Disp: 30 tablet, Rfl: 1   docusate sodium (COLACE) 100 MG capsule, Take 100-200 mg by mouth See admin instructions. Take 100 mg in the morning and 200 mg at bedtime, Disp: , Rfl:    estradiol (ESTRACE) 0.1 MG/GM vaginal cream, Place 0.5 g vaginally once a week. Place 0.5g twice a week at opening of vagina, Disp: , Rfl:    ezetimibe (ZETIA) 10 MG tablet, Take 1 tablet (10 mg total) by mouth daily., Disp: 90  tablet, Rfl: 3   ferrous sulfate 300 (60 Fe) MG/5ML syrup, Take 5 mLs (300 mg total) by mouth 2 (two) times daily with a meal., Disp: 300 mL, Rfl: 11   levothyroxine (SYNTHROID) 175 MCG tablet, TAKE 1 TABLET BY MOUTH EVERY MORNING ON MONDAY - FRIDAY, Disp: 90 tablet, Rfl: 1   lisinopril-hydrochlorothiazide (ZESTORETIC) 20-25 MG tablet, Take 0.5 tablets by mouth 3 (three) times a week. Take on Monday Wednesday and Friday only, Disp: 90 tablet, Rfl: 1   nitroGLYCERIN (NITROSTAT) 0.4 MG SL tablet, Place 1 tablet (0.4 mg total) under the tongue every 5 (five) minutes x 3 doses as needed for chest pain., Disp: 25 tablet, Rfl: 3   prochlorperazine (COMPAZINE) 10 MG tablet, Take 1 tablet (10 mg total) by mouth every 6 (six) hours as needed for nausea or vomiting., Disp: 30 tablet, Rfl: 0   sucralfate (CARAFATE) 1 g tablet, Take 1 tablet (1 g total) by mouth 4 (four) times daily -  with meals and at bedtime. Crush and dissolve in 10 mL of warm water, swallow prior to meals, Disp: 120 tablet, Rfl: 1   sulfamethoxazole-trimethoprim (BACTRIM) 400-80 MG tablet, Take 0.5 tablets by mouth daily., Disp: 30 tablet, Rfl: 5   Trospium Chloride 60 MG CP24, TAKE 1 CAPSULE BY MOUTH EVERY DAY, Disp: 30 capsule, Rfl: 5   Allergies  Allergen Reactions   Codeine Nausea And Vomiting   Norvasc [Amlodipine] Rash and Other (See Comments)    rash     Review of Systems  Constitutional:  Negative for fatigue and weight loss.  Eyes: Negative.   Gastrointestinal:  Negative for constipation.  Endocrine: Negative for cold intolerance.  Neurological:  Negative for tremors.    Today's Vitals   08/28/21 0846  BP: 140/76  Temp: 98.3 F (36.8 C)  Weight: 166 lb 6.4 oz (75.5 kg)  Height: '5\' 6"'  (1.676 m)  PainSc: 0-No pain   Body mass index is 26.86 kg/m.   Objective:  Physical Exam      Assessment And Plan:     1. Benign hypertension with CKD (chronic kidney disease) stage III (HCC) Comments: Blood pressure is  fairly controlled, encouraged to stay well hydrated.  - CMP14+EGFR  2. Acquired hypothyroidism Comments: Stable, continue current medications - TSH - T4, Free - T3, free  3. BMI 26.0-26.9,adult  4. Malignant neoplasm of unspecified part of unspecified bronchus or lung ([place]) Comments: Norton Blizzard the Oncology Navigator will set up transportation for her chemo today.      Patient was given opportunity to ask questions. Patient verbalized understanding of the plan and was able to repeat  key elements of the plan. All questions were answered to their satisfaction.  [name], FNP   I, [name], FNP, have reviewed all documentation for this visit. The documentation on 08/28/21 for the exam, diagnosis, procedures, and orders are all accurate and complete.   IF YOU HAVE BEEN REFERRED TO A SPECIALIST, IT MAY TAKE 1-2 WEEKS TO SCHEDULE/PROCESS THE REFERRAL. IF YOU HAVE NOT HEARD FROM US/SPECIALIST IN TWO WEEKS, PLEASE GIVE [place] A CALL AT [phone].   THE PATIENT IS ENCOURAGED TO PRACTICE SOCIAL DISTANCING DUE TO THE COVID-19 PANDEMIC.

## 2021-08-28 NOTE — Patient Instructions (Signed)

## 2021-08-28 NOTE — Progress Notes (Signed)
Per Cassie PA, pt ok to treat with SCR of 1.6

## 2021-08-29 ENCOUNTER — Other Ambulatory Visit: Payer: Self-pay

## 2021-08-29 ENCOUNTER — Encounter: Payer: Self-pay | Admitting: Internal Medicine

## 2021-08-29 ENCOUNTER — Ambulatory Visit
Admission: RE | Admit: 2021-08-29 | Discharge: 2021-08-29 | Disposition: A | Discharge status: Home / Self Care | Payer: PPO | Source: Ambulatory Visit | Attending: Radiation Oncology | Admitting: Radiation Oncology

## 2021-08-29 DIAGNOSIS — C3432 Malignant neoplasm of lower lobe, left bronchus or lung: Secondary | ICD-10-CM | POA: Diagnosis not present

## 2021-08-29 DIAGNOSIS — Z510 Encounter for antineoplastic radiation therapy: Secondary | ICD-10-CM | POA: Diagnosis not present

## 2021-08-29 DIAGNOSIS — R7303 Prediabetes: Secondary | ICD-10-CM | POA: Diagnosis not present

## 2021-08-29 LAB — CMP14+EGFR
ALT: 9 IU/L (ref 0–32)
AST: 14 IU/L (ref 0–40)
Albumin/Globulin Ratio: 1.5 (ref 1.2–2.2)
Albumin: 4.1 g/dL (ref 3.6–4.6)
Alkaline Phosphatase: 88 IU/L (ref 44–121)
BUN/Creatinine Ratio: 14 (ref 12–28)
BUN: 20 mg/dL (ref 8–27)
Bilirubin Total: 0.3 mg/dL (ref 0.0–1.2)
CO2: 24 mmol/L (ref 20–29)
Calcium: 9 mg/dL (ref 8.7–10.3)
Chloride: 101 mmol/L (ref 96–106)
Creatinine, Ser: 1.43 mg/dL — ABNORMAL HIGH (ref 0.57–1.00)
Globulin, Total: 2.8 g/dL (ref 1.5–4.5)
Glucose: 108 mg/dL — ABNORMAL HIGH (ref 70–99)
Potassium: 5 mmol/L (ref 3.5–5.2)
Sodium: 137 mmol/L (ref 134–144)
Total Protein: 6.9 g/dL (ref 6.0–8.5)
eGFR: 36 mL/min/{1.73_m2} — ABNORMAL LOW (ref >59)

## 2021-08-29 LAB — TSH: TSH: 15.3 u[IU]/mL — ABNORMAL HIGH (ref 0.450–4.500)

## 2021-08-29 LAB — T4, FREE: Free T4: 1.15 ng/dL (ref 0.82–1.77)

## 2021-08-29 LAB — T3, FREE: T3, Free: 1.8 pg/mL — ABNORMAL LOW (ref 2.0–4.4)

## 2021-08-30 ENCOUNTER — Ambulatory Visit
Admission: RE | Admit: 2021-08-30 | Discharge: 2021-08-30 | Disposition: A | Discharge status: Home / Self Care | Payer: PPO | Source: Ambulatory Visit | Attending: Radiation Oncology | Admitting: Radiation Oncology

## 2021-08-30 DIAGNOSIS — C3432 Malignant neoplasm of lower lobe, left bronchus or lung: Secondary | ICD-10-CM | POA: Diagnosis not present

## 2021-08-30 DIAGNOSIS — Z510 Encounter for antineoplastic radiation therapy: Secondary | ICD-10-CM | POA: Diagnosis not present

## 2021-08-30 DIAGNOSIS — R7303 Prediabetes: Secondary | ICD-10-CM | POA: Diagnosis not present

## 2021-09-02 ENCOUNTER — Encounter (HOSPITAL_COMMUNITY): Payer: Self-pay

## 2021-09-02 ENCOUNTER — Ambulatory Visit
Admission: RE | Admit: 2021-09-02 | Discharge: 2021-09-02 | Disposition: A | Discharge status: Home / Self Care | Payer: PPO | Source: Ambulatory Visit | Attending: Radiation Oncology | Admitting: Radiation Oncology

## 2021-09-02 ENCOUNTER — Other Ambulatory Visit: Payer: Self-pay

## 2021-09-02 ENCOUNTER — Ambulatory Visit: Payer: PPO | Admitting: Internal Medicine

## 2021-09-02 ENCOUNTER — Telehealth: Payer: Self-pay | Admitting: Emergency Medicine

## 2021-09-02 DIAGNOSIS — R7303 Prediabetes: Secondary | ICD-10-CM | POA: Diagnosis not present

## 2021-09-02 DIAGNOSIS — C3432 Malignant neoplasm of lower lobe, left bronchus or lung: Secondary | ICD-10-CM | POA: Diagnosis not present

## 2021-09-02 DIAGNOSIS — Z510 Encounter for antineoplastic radiation therapy: Secondary | ICD-10-CM | POA: Diagnosis not present

## 2021-09-02 MED FILL — Dexamethasone Sodium Phosphate Inj 100 MG/10ML: INTRAMUSCULAR | Qty: 1 | Status: AC

## 2021-09-02 NOTE — Telephone Encounter (Signed)
A Randomized pragmatic Chair-Based Home Exercise Intervention for Mitigating Cancer-Related Fatigue in Older Adults Undergoing Chemotherapy for Advanced Disease  09/02/21    4:30pm: Called to complete Week 3 coaching phone call.  Patient did not answer, left voicemail requesting return call.  [name] Clinical Research Coordinator I  09/02/21  4:37 PM

## 2021-09-03 ENCOUNTER — Inpatient Hospital Stay: Payer: PPO

## 2021-09-03 ENCOUNTER — Encounter: Payer: Self-pay | Admitting: Emergency Medicine

## 2021-09-03 ENCOUNTER — Ambulatory Visit
Admission: RE | Admit: 2021-09-03 | Discharge: 2021-09-03 | Disposition: A | Discharge status: Home / Self Care | Payer: PPO | Source: Ambulatory Visit | Attending: Radiation Oncology | Admitting: Radiation Oncology

## 2021-09-03 ENCOUNTER — Encounter: Payer: Self-pay | Admitting: Internal Medicine

## 2021-09-03 VITALS — BP 125/67 | HR 69 | Temp 97.9°F | Resp 17 | Wt 166.8 lb

## 2021-09-03 DIAGNOSIS — C3490 Malignant neoplasm of unspecified part of unspecified bronchus or lung: Secondary | ICD-10-CM

## 2021-09-03 DIAGNOSIS — R7303 Prediabetes: Secondary | ICD-10-CM | POA: Diagnosis not present

## 2021-09-03 DIAGNOSIS — Z510 Encounter for antineoplastic radiation therapy: Secondary | ICD-10-CM | POA: Diagnosis not present

## 2021-09-03 DIAGNOSIS — C3432 Malignant neoplasm of lower lobe, left bronchus or lung: Secondary | ICD-10-CM | POA: Diagnosis not present

## 2021-09-03 LAB — CMP (CANCER CENTER ONLY)
ALT: 9 U/L (ref 0–44)
AST: 12 U/L — ABNORMAL LOW (ref 15–41)
Albumin: 3.8 g/dL (ref 3.5–5.0)
Alkaline Phosphatase: 70 U/L (ref 38–126)
Anion gap: 8 (ref 5–15)
BUN: 23 mg/dL (ref 8–23)
CO2: 25 mmol/L (ref 22–32)
Calcium: 8.9 mg/dL (ref 8.9–10.3)
Chloride: 102 mmol/L (ref 98–111)
Creatinine: 1.44 mg/dL — ABNORMAL HIGH (ref 0.44–1.00)
GFR, Estimated: 36 mL/min — ABNORMAL LOW (ref >60)
Glucose, Bld: 147 mg/dL — ABNORMAL HIGH (ref 70–99)
Potassium: 4.5 mmol/L (ref 3.5–5.1)
Sodium: 135 mmol/L (ref 135–145)
Total Bilirubin: 0.3 mg/dL (ref 0.3–1.2)
Total Protein: 6.9 g/dL (ref 6.5–8.1)

## 2021-09-03 LAB — CBC WITH DIFFERENTIAL (CANCER CENTER ONLY)
Abs Immature Granulocytes: 0.01 10*3/uL (ref 0.00–0.07)
Basophils Absolute: 0 10*3/uL (ref 0.0–0.1)
Basophils Relative: 1 %
Eosinophils Absolute: 0.2 10*3/uL (ref 0.0–0.5)
Eosinophils Relative: 8 %
HCT: 31.3 % — ABNORMAL LOW (ref 36.0–46.0)
Hemoglobin: 10 g/dL — ABNORMAL LOW (ref 12.0–15.0)
Immature Granulocytes: 0 %
Lymphocytes Relative: 5 %
Lymphs Abs: 0.1 10*3/uL — ABNORMAL LOW (ref 0.7–4.0)
MCH: 29.9 pg (ref 26.0–34.0)
MCHC: 31.9 g/dL (ref 30.0–36.0)
MCV: 93.7 fL (ref 80.0–100.0)
Monocytes Absolute: 0.2 10*3/uL (ref 0.1–1.0)
Monocytes Relative: 6 %
Neutro Abs: 2.3 10*3/uL (ref 1.7–7.7)
Neutrophils Relative %: 80 %
Platelet Count: 144 10*3/uL — ABNORMAL LOW (ref 150–400)
RBC: 3.34 MIL/uL — ABNORMAL LOW (ref 3.87–5.11)
RDW: 14.7 % (ref 11.5–15.5)
WBC Count: 2.8 10*3/uL — ABNORMAL LOW (ref 4.0–10.5)
nRBC: 0 % (ref 0.0–0.2)

## 2021-09-03 MED ORDER — SODIUM CHLORIDE 0.9 % IV SOLN: Freq: Once | INTRAVENOUS | Status: AC

## 2021-09-03 MED ORDER — DIPHENHYDRAMINE HCL 50 MG/ML IJ SOLN
50.0000 mg | Freq: Once | INTRAMUSCULAR | Status: AC
  Administered 2021-09-03: 50 mg via INTRAVENOUS
  Filled 2021-09-03: qty 1

## 2021-09-03 MED ORDER — SODIUM CHLORIDE 0.9 % IV SOLN
120.0000 mg | Freq: Once | INTRAVENOUS | Status: AC
  Administered 2021-09-03: 120 mg via INTRAVENOUS
  Filled 2021-09-03: qty 12

## 2021-09-03 MED ORDER — SODIUM CHLORIDE 0.9 % IV SOLN
45.0000 mg/m2 | Freq: Once | INTRAVENOUS | Status: AC
  Administered 2021-09-03: 84 mg via INTRAVENOUS
  Filled 2021-09-03: qty 14

## 2021-09-03 MED ORDER — SODIUM CHLORIDE 0.9 % IV SOLN
10.0000 mg | Freq: Once | INTRAVENOUS | Status: AC
  Administered 2021-09-03: 10 mg via INTRAVENOUS
  Filled 2021-09-03: qty 10

## 2021-09-03 MED ORDER — PALONOSETRON HCL INJECTION 0.25 MG/5ML
0.2500 mg | Freq: Once | INTRAVENOUS | Status: AC
  Administered 2021-09-03: 0.25 mg via INTRAVENOUS
  Filled 2021-09-03: qty 5

## 2021-09-03 MED ORDER — FAMOTIDINE 20 MG IN NS 100 ML IVPB
20.0000 mg | Freq: Once | INTRAVENOUS | Status: AC
  Administered 2021-09-03: 20 mg via INTRAVENOUS
  Filled 2021-09-03: qty 100

## 2021-09-03 NOTE — Research (Signed)
A Randomized pragmatic Chair-Based Home Exercise Intervention for Mitigating Cancer-Related Fatigue in Older Adults Undergoing Chemotherapy for Advanced Disease   09/03/21 - Week 3 Coaching visit   Patient was present in the clinic today for infusion appointment.  This coordinator met with the patient in person in the infusion room to complete required week 3 coaching call.  The patient states she has started performing the exercises and has completed them two times last week and once this week.  She states she typically completes them early in the morning after she wakes up.  She notes that one time she performed the exercises for about an hour.  Recommended to try to increase the number of days per week she is exercising with a goal of 5 days a week for 30 minutes each day.  Discussed reducing the time of exercise performed each day if she finds herself becoming very tired while exercising.  Reminded the patient to complete exercise logs with time that she exercises each day.  Advised patient of next scheduled call in two more weeks at week 5 of the study.  Patient denied questions at this time.  The was thanked for her ongoing participation in this study.  [name] Clinical Research Coordinator I  09/03/21 11:27 AM

## 2021-09-04 ENCOUNTER — Encounter: Payer: Self-pay | Admitting: Internal Medicine

## 2021-09-04 ENCOUNTER — Other Ambulatory Visit: Payer: Self-pay

## 2021-09-04 ENCOUNTER — Ambulatory Visit
Admission: RE | Admit: 2021-09-04 | Discharge: 2021-09-04 | Disposition: A | Discharge status: Home / Self Care | Payer: PPO | Source: Ambulatory Visit | Attending: Radiation Oncology | Admitting: Radiation Oncology

## 2021-09-04 DIAGNOSIS — C3432 Malignant neoplasm of lower lobe, left bronchus or lung: Secondary | ICD-10-CM | POA: Diagnosis not present

## 2021-09-04 DIAGNOSIS — R7303 Prediabetes: Secondary | ICD-10-CM | POA: Diagnosis not present

## 2021-09-04 DIAGNOSIS — Z510 Encounter for antineoplastic radiation therapy: Secondary | ICD-10-CM | POA: Diagnosis not present

## 2021-09-05 ENCOUNTER — Ambulatory Visit
Admission: RE | Admit: 2021-09-05 | Discharge: 2021-09-05 | Disposition: A | Discharge status: Home / Self Care | Payer: PPO | Source: Ambulatory Visit | Attending: Radiation Oncology | Admitting: Radiation Oncology

## 2021-09-05 DIAGNOSIS — Z510 Encounter for antineoplastic radiation therapy: Secondary | ICD-10-CM | POA: Diagnosis not present

## 2021-09-05 DIAGNOSIS — R7303 Prediabetes: Secondary | ICD-10-CM | POA: Diagnosis not present

## 2021-09-05 DIAGNOSIS — C3432 Malignant neoplasm of lower lobe, left bronchus or lung: Secondary | ICD-10-CM | POA: Diagnosis not present

## 2021-09-06 ENCOUNTER — Other Ambulatory Visit: Payer: Self-pay

## 2021-09-06 ENCOUNTER — Ambulatory Visit
Admission: RE | Admit: 2021-09-06 | Discharge: 2021-09-06 | Disposition: A | Discharge status: Home / Self Care | Payer: PPO | Source: Ambulatory Visit | Attending: Radiation Oncology | Admitting: Radiation Oncology

## 2021-09-06 DIAGNOSIS — C3432 Malignant neoplasm of lower lobe, left bronchus or lung: Secondary | ICD-10-CM | POA: Diagnosis not present

## 2021-09-06 DIAGNOSIS — Z510 Encounter for antineoplastic radiation therapy: Secondary | ICD-10-CM | POA: Diagnosis not present

## 2021-09-06 DIAGNOSIS — R7303 Prediabetes: Secondary | ICD-10-CM | POA: Diagnosis not present

## 2021-09-08 ENCOUNTER — Encounter: Payer: Self-pay | Admitting: Internal Medicine

## 2021-09-09 ENCOUNTER — Ambulatory Visit
Admission: RE | Admit: 2021-09-09 | Discharge: 2021-09-09 | Disposition: A | Discharge status: Home / Self Care | Payer: PPO | Source: Ambulatory Visit | Attending: Radiation Oncology | Admitting: Radiation Oncology

## 2021-09-09 ENCOUNTER — Other Ambulatory Visit: Payer: Self-pay

## 2021-09-09 DIAGNOSIS — R7303 Prediabetes: Secondary | ICD-10-CM | POA: Diagnosis not present

## 2021-09-09 DIAGNOSIS — C3432 Malignant neoplasm of lower lobe, left bronchus or lung: Secondary | ICD-10-CM | POA: Diagnosis not present

## 2021-09-09 DIAGNOSIS — Z510 Encounter for antineoplastic radiation therapy: Secondary | ICD-10-CM | POA: Diagnosis not present

## 2021-09-10 ENCOUNTER — Inpatient Hospital Stay: Payer: PPO

## 2021-09-10 ENCOUNTER — Encounter: Payer: Self-pay | Admitting: Internal Medicine

## 2021-09-10 ENCOUNTER — Ambulatory Visit
Admission: RE | Admit: 2021-09-10 | Discharge: 2021-09-10 | Disposition: A | Discharge status: Home / Self Care | Payer: PPO | Source: Ambulatory Visit | Attending: Radiation Oncology | Admitting: Radiation Oncology

## 2021-09-10 ENCOUNTER — Encounter: Payer: Self-pay | Admitting: *Deleted

## 2021-09-10 ENCOUNTER — Inpatient Hospital Stay: Payer: PPO | Admitting: Internal Medicine

## 2021-09-10 VITALS — BP 129/59 | HR 87 | Temp 98.3°F | Resp 19 | Ht 66.0 in | Wt 164.4 lb

## 2021-09-10 DIAGNOSIS — Z5111 Encounter for antineoplastic chemotherapy: Secondary | ICD-10-CM

## 2021-09-10 DIAGNOSIS — Z510 Encounter for antineoplastic radiation therapy: Secondary | ICD-10-CM | POA: Diagnosis not present

## 2021-09-10 DIAGNOSIS — C3490 Malignant neoplasm of unspecified part of unspecified bronchus or lung: Secondary | ICD-10-CM

## 2021-09-10 DIAGNOSIS — R7303 Prediabetes: Secondary | ICD-10-CM | POA: Diagnosis not present

## 2021-09-10 DIAGNOSIS — C3432 Malignant neoplasm of lower lobe, left bronchus or lung: Secondary | ICD-10-CM | POA: Diagnosis not present

## 2021-09-10 LAB — CMP (CANCER CENTER ONLY)
ALT: 7 U/L (ref 0–44)
AST: 10 U/L — ABNORMAL LOW (ref 15–41)
Albumin: 3.8 g/dL (ref 3.5–5.0)
Alkaline Phosphatase: 79 U/L (ref 38–126)
Anion gap: 8 (ref 5–15)
BUN: 30 mg/dL — ABNORMAL HIGH (ref 8–23)
CO2: 26 mmol/L (ref 22–32)
Calcium: 9.1 mg/dL (ref 8.9–10.3)
Chloride: 103 mmol/L (ref 98–111)
Creatinine: 1.56 mg/dL — ABNORMAL HIGH (ref 0.44–1.00)
GFR, Estimated: 33 mL/min — ABNORMAL LOW (ref >60)
Glucose, Bld: 196 mg/dL — ABNORMAL HIGH (ref 70–99)
Potassium: 4.4 mmol/L (ref 3.5–5.1)
Sodium: 137 mmol/L (ref 135–145)
Total Bilirubin: 0.3 mg/dL (ref 0.3–1.2)
Total Protein: 6.8 g/dL (ref 6.5–8.1)

## 2021-09-10 LAB — CBC WITH DIFFERENTIAL (CANCER CENTER ONLY)
Abs Immature Granulocytes: 0.01 10*3/uL (ref 0.00–0.07)
Basophils Absolute: 0 10*3/uL (ref 0.0–0.1)
Basophils Relative: 2 %
Eosinophils Absolute: 0.1 10*3/uL (ref 0.0–0.5)
Eosinophils Relative: 7 %
HCT: 30.7 % — ABNORMAL LOW (ref 36.0–46.0)
Hemoglobin: 9.5 g/dL — ABNORMAL LOW (ref 12.0–15.0)
Immature Granulocytes: 1 %
Lymphocytes Relative: 10 %
Lymphs Abs: 0.1 10*3/uL — ABNORMAL LOW (ref 0.7–4.0)
MCH: 29.5 pg (ref 26.0–34.0)
MCHC: 30.9 g/dL (ref 30.0–36.0)
MCV: 95.3 fL (ref 80.0–100.0)
Monocytes Absolute: 0.2 10*3/uL (ref 0.1–1.0)
Monocytes Relative: 14 %
Neutro Abs: 0.8 10*3/uL — ABNORMAL LOW (ref 1.7–7.7)
Neutrophils Relative %: 66 %
Platelet Count: 160 10*3/uL (ref 150–400)
RBC: 3.22 MIL/uL — ABNORMAL LOW (ref 3.87–5.11)
RDW: 15.8 % — ABNORMAL HIGH (ref 11.5–15.5)
WBC Count: 1.3 10*3/uL — ABNORMAL LOW (ref 4.0–10.5)
nRBC: 0 % (ref 0.0–0.2)

## 2021-09-10 NOTE — Progress Notes (Signed)
Oncology Nurse Navigator Documentation  Oncology Nurse Navigator Flowsheets 09/10/2021 08/28/2021 08/09/2021 07/26/2021 07/19/2021  Abnormal Finding Date - - - - 04/15/2021  Confirmed Diagnosis Date - - - - 07/15/2021  Diagnosis Status - - - Confirmed Diagnosis Complete -  Planned Course of Treatment - - - Chemo/Radiation Concurrent -  Phase of Treatment Radiation - Chemo Radiation -  Chemotherapy Actual Start Date: - - 08/05/2021 - -  Chemotherapy Actual End Date: 09/03/2021 - - - -  Radiation Actual Start Date: 08/05/2021 - - - -  Radiation Expected End Date: 09/17/2021 - - - -  Navigator Follow Up Date: 10/07/2021 - 08/28/2021 07/30/2021 07/25/2021  Navigator Follow Up Reason: Follow-up Appointment - Follow-up Appointment Appointment Review New Patient Appointment  Navigator Location [facility]  Navigator Encounter Type Clinic/MDC Telephone Appt/Treatment Plan Review Other: Telephone  Telephone - Appt Confirmation/Clarification;Education - - Appt Confirmation/Clarification  Multidisiplinary Clinic Date - - - - 07/25/2021  Multidisiplinary Clinic Type - - - - Thoracic  Treatment Initiated Date - - 07/30/2021 - -  Patient Visit Type Follow-up;MedOnc Other Other - Other  Treatment Phase Treatment Treatment Treatment - Pre-Tx/Tx Discussion  Barriers/Navigation Needs Education/I was able to speak to Abigail [name] today at the clinic.  Her WBC's are low today and she is not getting any systemic therapy and is currently done with systemic therapy at this time.  I helped to explain neutropenic precautions and plan of care.  Coordination of Care;Education Coordination of Care Coordination of Care Coordination of Care;Education  Education Other Transport During Treatment;Other - - Other  Interventions Education;Psycho-Social Support Coordination of Care;Education;Psycho-Social Support Coordination of Care Coordination of Care Coordination of  Care;Education;Psycho-Social Support  Acuity Level 3-Moderate Needs (3-4 Barriers Identified) Level 3-Moderate Needs (3-4 Barriers Identified) Level 2-Minimal Needs (1-2 Barriers Identified) Level 2-Minimal Needs (1-2 Barriers Identified) Level 2-Minimal Needs (1-2 Barriers Identified)  Coordination of Care - Other Other - Appts  Education Method Verbal Verbal - - Verbal  Time Spent with Patient [phone]

## 2021-09-10 NOTE — Progress Notes (Signed)
[facility] [phone]   [phone]  OFFICE PROGRESS NOTE  [name], [address] [place] [address]  DIAGNOSIS: Stage IIIA (T2a, N2, M0) non-small cell lung cancer, adenocarcinoma presented with left lower lobe lung mass in addition to left hilar and mediastinal lymphadenopathy diagnosed in November 2022 with no actionable mutation on the guardant blood test   PRIOR THERAPY: None   CURRENT THERAPY: Weekly concurrent chemoradiation with carboplatin for an AUC of 2 and paclitaxel 45 mg per metered squared.  First dose on 08/05/2021.  Status post 6 cycles.  INTERVAL HISTORY: Abigail [name] 86 y.o. female returns to the clinic today for follow-up visit.  The patient is feeling fine today with no concerning complaints except for fatigue and difficulty swallowing mainly to solid and liquid secondary to radiation-induced dysphagia and odynophagia.  She lost few pounds since her last visit.  She denied having any current chest pain but has shortness of breath with exertion with mild cough and no hemoptysis.  She denied having any fever or chills.  She has no nausea, vomiting, diarrhea or constipation.  She has occasional nosebleeds especially in the morning because of dry nose.  She is here today for evaluation before starting cycle #7 of her chemotherapy.  MEDICAL HISTORY: Past Medical History:  Diagnosis Date   Anemia    Arthritis    knees, back   Carotid artery occlusion    Chronic kidney disease    stage 3 ckd no nephrologist   Complete uterine prolapse with prolapse of anterior vaginal wall    Complication of anesthesia    hard to wake up per pt   Constipation    Coronary artery disease    Diverticulitis yrs ago coialitis   Dyspnea    History of blood transfusion    Hypertension    Hypothyroid    Numbness    in hands at times   Pneumonia    Pre-diabetes    Scoliosis    STEMI (ST elevation myocardial infarction)  ([place]) 10/26/2019   DES RCA   Wears dentures    full dentures   Wears glasses    for reading    ALLERGIES:  is allergic to codeine and norvasc [amlodipine].  MEDICATIONS:  Current Outpatient Medications  Medication Sig Dispense Refill   acetaminophen (TYLENOL) 325 MG tablet Take 2 tablets (650 mg total) by mouth every 6 (six) hours as needed for mild pain (or Fever >/= 101).     albuterol (VENTOLIN HFA) 108 (90 Base) MCG/ACT inhaler Inhale 2 puffs into the lungs every 6 (six) hours as needed for wheezing or shortness of breath. 8 g 0   aspirin EC 81 MG tablet Take 81 mg by mouth at bedtime.     atorvastatin (LIPITOR) 80 MG tablet Take 1 tablet (80 mg total) by mouth daily at 6 PM. 90 tablet 3   Cholecalciferol (VITAMIN D3 PO) Take 2 capsules by mouth daily.     Cranberry 1000 MG CAPS Take 1,000 mg by mouth daily at 12 noon.     cyclobenzaprine (FLEXERIL) 10 MG tablet Take 1 tablet (10 mg total) by mouth 3 (three) times daily as needed for muscle spasms. 30 tablet 1   docusate sodium (COLACE) 100 MG capsule Take 100-200 mg by mouth See admin instructions. Take 100 mg in the morning and 200 mg at bedtime     estradiol (ESTRACE) 0.1 MG/GM vaginal cream Place 0.5 g  vaginally once a week. Place 0.5g twice a week at opening of vagina     ezetimibe (ZETIA) 10 MG tablet Take 1 tablet (10 mg total) by mouth daily. 90 tablet 3   ferrous sulfate 300 (60 Fe) MG/5ML syrup Take 5 mLs (300 mg total) by mouth 2 (two) times daily with a meal. 300 mL 11   levothyroxine (SYNTHROID) 175 MCG tablet TAKE 1 TABLET BY MOUTH EVERY MORNING ON MONDAY - FRIDAY 90 tablet 1   lisinopril-hydrochlorothiazide (ZESTORETIC) 20-25 MG tablet Take 0.5 tablets by mouth 3 (three) times a week. Take on Monday Wednesday and Friday only 90 tablet 1   nitroGLYCERIN (NITROSTAT) 0.4 MG SL tablet Place 1 tablet (0.4 mg total) under the tongue every 5 (five) minutes x 3 doses as needed for chest pain. 25 tablet 3   prochlorperazine  (COMPAZINE) 10 MG tablet Take 1 tablet (10 mg total) by mouth every 6 (six) hours as needed for nausea or vomiting. 30 tablet 2   sucralfate (CARAFATE) 1 g tablet Take 1 tablet (1 g total) by mouth 4 (four) times daily -  with meals and at bedtime. Crush and dissolve in 10 mL of warm water, swallow prior to meals 120 tablet 1   sulfamethoxazole-trimethoprim (BACTRIM) 400-80 MG tablet Take 0.5 tablets by mouth daily. 30 tablet 5   Trospium Chloride 60 MG CP24 TAKE 1 CAPSULE BY MOUTH EVERY DAY 30 capsule 5   No current facility-administered medications for this visit.    SURGICAL HISTORY:  Past Surgical History:  Procedure Laterality Date   ANTERIOR AND POSTERIOR REPAIR WITH SACROSPINOUS FIXATION N/A 12/20/2020   Procedure: SACROSPINOUS LIGAMENT FIXATION;  Surgeon: [name], MD;  Location: [facility];  Service: Gynecology;  Laterality: N/A;  Total time requested for all procedures is 2 hours   [place] age 58   lower   bartholin cyst removal  age 74's   BLADDER SUSPENSION N/A 12/20/2020   Procedure: TRANSVAGINAL TAPE (TVT) PROCEDURE;  Surgeon: [name], MD;  Location: [facility];  Service: Gynecology;  Laterality: N/A;   BRONCHIAL BRUSHINGS  07/15/2021   Procedure: BRONCHIAL BRUSHINGS;  Surgeon: Collene Gobble, MD;  Location: [facility] ENDOSCOPY;  Service: Pulmonary;;   BRONCHIAL NEEDLE ASPIRATION BIOPSY  07/15/2021   Procedure: BRONCHIAL NEEDLE ASPIRATION BIOPSIES;  Surgeon: Collene Gobble, MD;  Location: [facility] ENDOSCOPY;  Service: Pulmonary;;   CORONARY/GRAFT ACUTE MI REVASCULARIZATION N/A 10/26/2019   Procedure: Coronary/Graft Acute MI Revascularization;  Surgeon: Sherren Mocha, MD;  Location: [facility] CV LAB;  Service: Cardiovascular;  Laterality: N/A;   CYSTOCELE REPAIR N/A 12/20/2020   Procedure: ANTERIOR AND POSTERIOR REPAIR WITH PERINEORRHAPHY;  Surgeon: [name], MD;  Location: [facility];   Service: Gynecology;  Laterality: N/A;   CYSTOSCOPY N/A 12/20/2020   Procedure: CYSTOSCOPY;  Surgeon: [name], MD;  Location: [facility];  Service: Gynecology;  Laterality: N/A;   ELBOW SURGERY  1990's   left   ENDOBRONCHIAL ULTRASOUND N/A 07/15/2021   Procedure: ENDOBRONCHIAL ULTRASOUND;  Surgeon: Collene Gobble, MD;  Location: [facility] ENDOSCOPY;  Service: Pulmonary;  Laterality: N/A;   HEMOSTASIS CONTROL  07/15/2021   Procedure: HEMOSTASIS CONTROL;  Surgeon: Collene Gobble, MD;  Location: [facility] ENDOSCOPY;  Service: Pulmonary;;   LEFT HEART CATH AND CORONARY ANGIOGRAPHY N/A 10/26/2019   Procedure: LEFT HEART CATH AND CORONARY ANGIOGRAPHY;  Surgeon: Sherren Mocha, MD;  Location: [place] CV LAB;  Service: Cardiovascular;  Laterality: N/A;   TONSILLECTOMY      REVIEW OF SYSTEMS:  A comprehensive review of systems was negative except for: Constitutional: positive for fatigue and weight loss Ears, nose, mouth, throat, and face: positive for epistaxis Respiratory: positive for cough Musculoskeletal: positive for muscle weakness   PHYSICAL EXAMINATION: General appearance: alert, cooperative, fatigued, and no distress Head: Normocephalic, without obvious abnormality, atraumatic Neck: no adenopathy, no JVD, supple, symmetrical, trachea midline, and thyroid not enlarged, symmetric, no tenderness/mass/nodules Lymph nodes: Cervical, supraclavicular, and axillary nodes normal. Resp: clear to auscultation bilaterally Back: symmetric, no curvature. ROM normal. No CVA tenderness. Cardio: regular rate and rhythm, S1, S2 normal, no murmur, click, rub or gallop GI: soft, non-tender; bowel sounds normal; no masses,  no organomegaly Extremities: extremities normal, atraumatic, no cyanosis or edema  ECOG PERFORMANCE STATUS: 1 - Symptomatic but completely ambulatory  Blood pressure (!) 129/59, pulse 87, temperature 98.3 F (36.8 C), temperature source Tympanic, resp. rate 19,  height 5\' 6"  (1.676 m), weight 164 lb 6.4 oz (74.6 kg), SpO2 95 %.  LABORATORY DATA: Lab Results  Component Value Date   WBC 1.3 (L) 09/10/2021   HGB 9.5 (L) 09/10/2021   HCT 30.7 (L) 09/10/2021   MCV 95.3 09/10/2021   PLT 160 09/10/2021      Chemistry      Component Value Date/Time   NA 137 09/10/2021 1020   NA 137 08/28/2021 0923   K 4.4 09/10/2021 1020   CL 103 09/10/2021 1020   CO2 26 09/10/2021 1020   BUN 30 (H) 09/10/2021 1020   BUN 20 08/28/2021 0923   CREATININE 1.56 (H) 09/10/2021 1020   CREATININE 1.08 04/18/2014 1516   GLU 104 12/23/2017 1111      Component Value Date/Time   CALCIUM 9.1 09/10/2021 1020   ALKPHOS 79 09/10/2021 1020   AST 10 (L) 09/10/2021 1020   ALT 7 09/10/2021 1020   BILITOT 0.3 09/10/2021 1020       RADIOGRAPHIC STUDIES: No results found.  ASSESSMENT AND PLAN: This is a very pleasant 86 years old white female with a stage IIIa (T2a, N2, M0) non-small cell lung cancer, adenocarcinoma presented with left lower lobe lung mass in addition to left hilar and mediastinal lymphadenopathy diagnosed in November 2022 with no actionable mutations The patient is currently undergoing a course of concurrent chemotherapy with radiation.  Her chemotherapy is in the form of carboplatin for AUC of 2 and paclitaxel 45 Mg/M2 status post 6 cycles.  She has been tolerating her treatment with concurrent chemoradiation fairly well except for the radiation-induced odynophagia and dysphagia. Her absolute neutrophil count is low today.  I recommended for the patient to skip her last treatment today. She will continue with her radiotherapy as planned. She is expected to complete the last fraction of radiation on September 17, 2021. I will see the patient back for follow-up visit in 1 months with repeat CT scan of the chest for restaging of her disease. For the intermittent epistaxis especially in the morning secondary to dry nostrils, I recommended for the patient to use  over-the-counter nasal saline drops at regular basis. She was advised to call immediately if she has any other concerning symptoms in the interval. The patient voices understanding of current disease status and treatment options and is in agreement with the current care plan.  All questions were answered. The patient knows to call the clinic with any problems, questions or concerns. We can certainly see the patient much sooner if  necessary. The total time spent in the appointment was 20 minutes.  Disclaimer: This note was dictated with voice recognition software. Similar sounding words can inadvertently be transcribed and may not be corrected upon review.

## 2021-09-11 ENCOUNTER — Ambulatory Visit (INDEPENDENT_AMBULATORY_CARE_PROVIDER_SITE_OTHER): Payer: PPO | Admitting: Internal Medicine

## 2021-09-11 ENCOUNTER — Encounter: Payer: Self-pay | Admitting: Internal Medicine

## 2021-09-11 ENCOUNTER — Other Ambulatory Visit: Payer: Self-pay

## 2021-09-11 ENCOUNTER — Telehealth: Payer: PPO

## 2021-09-11 ENCOUNTER — Telehealth: Payer: Self-pay

## 2021-09-11 ENCOUNTER — Ambulatory Visit
Admission: RE | Admit: 2021-09-11 | Discharge: 2021-09-11 | Disposition: A | Discharge status: Home / Self Care | Payer: PPO | Source: Ambulatory Visit | Attending: Radiation Oncology | Admitting: Radiation Oncology

## 2021-09-11 VITALS — BP 130/60 | HR 64 | Ht 66.0 in | Wt 168.0 lb

## 2021-09-11 DIAGNOSIS — D509 Iron deficiency anemia, unspecified: Secondary | ICD-10-CM | POA: Diagnosis not present

## 2021-09-11 DIAGNOSIS — Z510 Encounter for antineoplastic radiation therapy: Secondary | ICD-10-CM | POA: Diagnosis not present

## 2021-09-11 DIAGNOSIS — C3432 Malignant neoplasm of lower lobe, left bronchus or lung: Secondary | ICD-10-CM | POA: Diagnosis not present

## 2021-09-11 DIAGNOSIS — R7303 Prediabetes: Secondary | ICD-10-CM | POA: Diagnosis not present

## 2021-09-11 MED ORDER — SUCRALFATE 1 G PO TABS: 1.0000 g | ORAL_TABLET | Freq: Three times a day (TID) | ORAL | 1 refills | Status: DC

## 2021-09-11 MED ORDER — ESOMEPRAZOLE MAGNESIUM 20 MG PO CPDR: 20.0000 mg | DELAYED_RELEASE_CAPSULE | Freq: Two times a day (BID) | ORAL | 3 refills | Status: DC

## 2021-09-11 NOTE — Progress Notes (Signed)
Chief Complaint: IDA  HPI : 86 year old with history of lung adenocarcinoma on chemoradiation, CAD, CKD presents with anemia  She has been having issues with anemia, and had significant anemia back in March 2022 with a Hb as low as 5.3.  Since then her hemoglobin has remained relatively stable in the 9-10 range over time.  She is supposed to be on oral iron supplements, but she has not been motivated to take these.  Has had IV iron supplementation in the past. Denies melena or hematochezia. She has had some issues with SOB that improved with albuterol therapy. She has a lot of fatigue as a result of being on chemotherapy and radiation for her NSCLC. She was recently taken off of chemotherapy due to her low WBC of 1.3. She has had a lot of issues with constipation, and occasional issues with diarrhea. She has been taking smooth move tea to try help with constipation issues. Denies N&V. Denies chest burning or regurgitaiton. Denies ab pain. She is on baby aspirin but denies NSAID use. Denies use of blood thinners. She is currently taking Nexium BID. She does have some issues with epistaxis for which she is uses nasal spray. This epistaxis usually results in blood clots from her nose, not active bleeding. She is currently on Nexium BID and has not yet started taking sucralfate suspension.  Wt Readings from Last 3 Encounters:  09/11/21 168 lb (76.2 kg)  09/10/21 164 lb 6.4 oz (74.6 kg)  09/03/21 166 lb 12 oz (75.6 kg)    Past Medical History:  Diagnosis Date   Anemia    Arthritis    knees, back   Carotid artery occlusion    Chronic kidney disease    stage 3 ckd no nephrologist   Complete uterine prolapse with prolapse of anterior vaginal wall    Complication of anesthesia    hard to wake up per pt   Constipation    Coronary artery disease    Diverticulitis yrs ago coialitis   Dyspnea    History of blood transfusion    Hypertension    Hypothyroid    Numbness    in hands at times    Pneumonia    Pre-diabetes    Scoliosis    STEMI (ST elevation myocardial infarction) ([place]) 10/26/2019   DES RCA   Wears dentures    full dentures   Wears glasses    for reading     Past Surgical History:  Procedure Laterality Date   ANTERIOR AND POSTERIOR REPAIR WITH SACROSPINOUS FIXATION N/A 12/20/2020   Procedure: SACROSPINOUS LIGAMENT FIXATION;  Surgeon: [name], MD;  Location: [facility];  Service: Gynecology;  Laterality: N/A;  Total time requested for all procedures is 2 hours   [place] 20   lower   bartholin cyst removal  age 50's   BLADDER SUSPENSION N/A 12/20/2020   Procedure: TRANSVAGINAL TAPE (TVT) PROCEDURE;  Surgeon: [name], MD;  Location: [facility];  Service: Gynecology;  Laterality: N/A;   BRONCHIAL BRUSHINGS  07/15/2021   Procedure: BRONCHIAL BRUSHINGS;  Surgeon: Collene Gobble, MD;  Location: [facility] ENDOSCOPY;  Service: Pulmonary;;   BRONCHIAL NEEDLE ASPIRATION BIOPSY  07/15/2021   Procedure: BRONCHIAL NEEDLE ASPIRATION BIOPSIES;  Surgeon: Collene Gobble, MD;  Location: [facility] ENDOSCOPY;  Service: Pulmonary;;   CORONARY/GRAFT ACUTE MI REVASCULARIZATION N/A 10/26/2019   Procedure: Coronary/Graft Acute MI Revascularization;  Surgeon: Sherren Mocha, MD;  Location: [place]  [facility];  Service: Cardiovascular;  Laterality: N/A;   CYSTOCELE REPAIR N/A 12/20/2020   Procedure: ANTERIOR AND POSTERIOR REPAIR WITH PERINEORRHAPHY;  Surgeon: [name], MD;  Location: [facility];  Service: Gynecology;  Laterality: N/A;   CYSTOSCOPY N/A 12/20/2020   Procedure: CYSTOSCOPY;  Surgeon: [name], MD;  Location[facility]  Service: Gynecology;  Laterality: N/A;   ELBOW SURGERY  1990's   left   ENDOBRONCHIAL ULTRASOUND N/A 07/15/2021   Procedure: ENDOBRONCHIAL ULTRASOUND;  Surgeon: Collene Gobble, MD;  Location: [facility] ENDOSCOPY;  Service: Pulmonary;   Laterality: N/A;   HEMOSTASIS CONTROL  07/15/2021   Procedure: HEMOSTASIS CONTROL;  Surgeon: Collene Gobble, MD;  Location: [facility] ENDOSCOPY;  Service: Pulmonary;;   LEFT HEART CATH AND CORONARY ANGIOGRAPHY N/A 10/26/2019   Procedure: LEFT HEART CATH AND CORONARY ANGIOGRAPHY;  Surgeon: Sherren Mocha, MD;  Location: [place] [facility];  Service: Cardiovascular;  Laterality: N/A;   TONSILLECTOMY     Family History  Problem Relation Age of Onset   Hypertension Mother    Social History   Tobacco Use   Smoking status: Former    Packs/day: 1.50    Years: 35.00    Pack years: 52.50    Types: Cigarettes    Quit date: 04/05/1986    Years since quitting: 35.4   Smokeless tobacco: Never  Vaping Use   Vaping Use: Never used  Substance Use Topics   Alcohol use: No   Drug use: No   Current Outpatient Medications  Medication Sig Dispense Refill   acetaminophen (TYLENOL) 325 MG tablet Take 2 tablets (650 mg total) by mouth every 6 (six) hours as needed for mild pain (or Fever >/= 101).     albuterol (VENTOLIN HFA) 108 (90 Base) MCG/ACT inhaler Inhale 2 puffs into the lungs every 6 (six) hours as needed for wheezing or shortness of breath. 8 g 0   aspirin EC 81 MG tablet Take 81 mg by mouth at bedtime.     atorvastatin (LIPITOR) 80 MG tablet Take 1 tablet (80 mg total) by mouth daily at 6 PM. 90 tablet 3   Cholecalciferol (VITAMIN D3 PO) Take 2 capsules by mouth daily.     Cranberry 1000 MG CAPS Take 1,000 mg by mouth daily at 12 noon.     cyclobenzaprine (FLEXERIL) 10 MG tablet Take 1 tablet (10 mg total) by mouth 3 (three) times daily as needed for muscle spasms. 30 tablet 1   docusate sodium (COLACE) 100 MG capsule Take 100-200 mg by mouth See admin instructions. Take 100 mg in the morning and 200 mg at bedtime     estradiol (ESTRACE) 0.1 MG/GM vaginal cream Place 0.5 g vaginally once a week. Place 0.5g twice a week at opening of vagina     ezetimibe (ZETIA) 10 MG tablet Take 1 tablet (10 mg  total) by mouth daily. 90 tablet 3   ferrous sulfate 300 (60 Fe) MG/5ML syrup Take 5 mLs (300 mg total) by mouth 2 (two) times daily with a meal. 300 mL 11   levothyroxine (SYNTHROID) 175 MCG tablet TAKE 1 TABLET BY MOUTH EVERY MORNING ON MONDAY - FRIDAY 90 tablet 1   lisinopril-hydrochlorothiazide (ZESTORETIC) 20-25 MG tablet Take 0.5 tablets by mouth 3 (three) times a week. Take on Monday Wednesday and Friday only 90 tablet 1   nitroGLYCERIN (NITROSTAT) 0.4 MG SL tablet Place 1 tablet (0.4 mg total) under the tongue every 5 (five) minutes x 3  doses as needed for chest pain. 25 tablet 3   prochlorperazine (COMPAZINE) 10 MG tablet Take 1 tablet (10 mg total) by mouth every 6 (six) hours as needed for nausea or vomiting. 30 tablet 2   sucralfate (CARAFATE) 1 g tablet Take 1 tablet (1 g total) by mouth 4 (four) times daily -  with meals and at bedtime. Crush and dissolve in 10 mL of warm water, swallow prior to meals 120 tablet 1   sulfamethoxazole-trimethoprim (BACTRIM) 400-80 MG tablet Take 0.5 tablets by mouth daily. 30 tablet 5   Trospium Chloride 60 MG CP24 TAKE 1 CAPSULE BY MOUTH EVERY DAY 30 capsule 5   No current facility-administered medications for this visit.   Allergies  Allergen Reactions   Codeine Nausea And Vomiting   Norvasc [Amlodipine] Rash and Other (See Comments)    rash     Review of Systems: All systems reviewed and negative except where noted in HPI.   Physical Exam: BP 130/60    Pulse 64    Ht 5\' 6"  (1.676 m)    Wt 168 lb (76.2 kg)    BMI 27.12 kg/m  Constitutional: Pleasant female, appears tired HEENT: Normocephalic and atraumatic. Conjunctivae are normal. No scleral icterus. Cardiovascular: Normal rate, regular rhythm.  Pulmonary/chest: Effort normal and breath sounds normal. No wheezing, rales or rhonchi. Abdominal: Soft, nondistended, nontender. Bowel sounds active throughout. There are no masses palpable. No hepatomegaly. Extremities: No edema Neurological:  Alert and oriented to person place and time. Does have some difficulty with ambulation Skin: Skin is warm and dry. No rashes noted. Psychiatric: Normal mood and affect. Behavior is normal.  Labs 03/2021: Ferritin 34  Labs 08/2021: CBC with low WBC 1.3 (ANC 0.8), low Hb 9.5 (from 10.0 previously), platelets 160. CMP with elevated Cr of 1.56 and elevated glucose.   PET scan 07/09/21: IMPRESSION: 1. Hypermetabolic 3.2 cm superior segment left lower lobe pulmonary mass consistent with primary lung neoplasm. Associated left hilar and mediastinal metastatic adenopathy. 2. No findings for pulmonary metastatic disease, abdominal/pelvic metastatic disease or osseous metastatic disease. 3. Stable advanced vascular disease and emphysema.  Colonoscopy 12/01/03: IMPRESSION:  1. Multiple colonic polyps removed by snare polypectomy and one removed by     cold biopsy forceps  2. Extensive sigmoid diverticulosis with a few scattered diverticula around     the hepatic flexure.  3. Visceral hypersensitivity indicated by abdominal discomfort while insufflating air in the colon, question IBS. Path: 1. ENDOSCOPIC BIOPSY, LEFT COLON POLYP:   - ADENOMATOUS POLYP(S). NO HIGH GRADE DYSPLASIA OR   INVASIVE MALIGNANCY IDENTIFIED.   - HYPERPLASTIC POLYP(S). NO ADENOMATOUS CHANGE OR   MALIGNANCY IDENTIFIED.   - SEE COMMENT   2. ENDOSCOPIC BIOPSY, RECTAL POLYP: HYPERPLASTIC POLYP(S). NO   ADENOMATOUS CHANGE OR MALIGNANCY IDENTIFIED.   COMMENT   1. The larger polyp is an adenomatous polyp. The margins appear   free of adenomatous change.   ASSESSMENT AND PLAN:  IDA Patient was referred for IDA, which on recent blood count checks has remained relatively stable. Based upon the patient's age and comorbidities, I do not think that her IDA warrant further endoscopic evaluation at this time. Patient is quite debilitated from her recent chemotherapy and radiation for NSCLC. She is currently on Nexium BID, and I  encouraged her to start sucralfate suspension QID. I also encouraged her to restart her PO iron and messaged Dr. Earlie Server for consideration of IV iron infusions to keep her blood counts up. After discussion of the  risks and benefits of checking a stool H pylori, patient was not interested in submitting a stool sample for further evaluation. - Continue PPI BID. Start sucralfate susp QID - Restart PO iron - Patient is not interested in checking stool H pylori - Will touch base with Dr. Earlie Server for consideration of IV iron infusions  Christia Reading, MD  I spent 62 minutes of time, including in depth chart review, independent review of results as outlined above, communicating results with the patient directly, face-to-face time with the patient, coordinating care, ordering studies and medications as appropriate, and documentation.

## 2021-09-11 NOTE — Patient Instructions (Signed)
Continue Nexium twice a day  Continue Carafate suspension  Restart oral iron   If you are age 84 or older, your body mass index should be between 23-30. Your Body mass index is 27.12 kg/m. If this is out of the aforementioned range listed, please consider follow up with your Primary Care Provider.  If you are age 34 or younger, your body mass index should be between 19-25. Your Body mass index is 27.12 kg/m. If this is out of the aformentioned range listed, please consider follow up with your Primary Care Provider.   ________________________________________________________  The [facility] providers would like to encourage you to use [facility] to communicate with providers for non-urgent requests or questions.  Due to long hold times on the telephone, sending your provider a message by [facility] may be a faster and more efficient way to get a response.  Please allow 48 business hours for a response.  Please remember that this is for non-urgent requests.  _______________________________________________________   I appreciate the  opportunity to care for you  Thank You   Sonny Masters Dorsey,MD

## 2021-09-11 NOTE — Telephone Encounter (Signed)
°  Care Management   Follow Up Note   09/11/2021 Name: Abigail [name] MRN: [id number] DOB: [birth year]   Referred by: [name], FNP Reason for referral : Chronic Care Management (RN CM Follow up call )   An unsuccessful telephone outreach was attempted today. The patient was referred to the case management team for assistance with care management and care coordination.   Follow Up Plan: Telephone follow up appointment with care management team member scheduled for: 10/18/21  Barb Merino, RN, BSN, CCM Care Management Coordinator [facility]  Direct Phone: [phone]

## 2021-09-12 ENCOUNTER — Ambulatory Visit
Admission: RE | Admit: 2021-09-12 | Discharge: 2021-09-12 | Disposition: A | Discharge status: Home / Self Care | Payer: PPO | Source: Ambulatory Visit | Attending: Radiation Oncology | Admitting: Radiation Oncology

## 2021-09-12 ENCOUNTER — Encounter: Payer: Self-pay | Admitting: Internal Medicine

## 2021-09-12 DIAGNOSIS — R7303 Prediabetes: Secondary | ICD-10-CM | POA: Diagnosis not present

## 2021-09-12 DIAGNOSIS — Z510 Encounter for antineoplastic radiation therapy: Secondary | ICD-10-CM | POA: Diagnosis not present

## 2021-09-12 DIAGNOSIS — C3432 Malignant neoplasm of lower lobe, left bronchus or lung: Secondary | ICD-10-CM | POA: Diagnosis not present

## 2021-09-12 NOTE — Progress Notes (Signed)
Met with patient at registration to complete grant process.  Patient approved for one-time $1000 [employer] grant to assist with personal expenses while going through treatment. Discussed in detail expenses and how they are covered. She received a gift card today from her grant.  She has a copy of approval letter and expense sheet along with the Outpatient pharmacy information in a green folder. My card was also inserted for any additional financial questions or concerns.

## 2021-09-13 ENCOUNTER — Ambulatory Visit: Payer: PPO

## 2021-09-16 ENCOUNTER — Ambulatory Visit
Admission: RE | Admit: 2021-09-16 | Discharge: 2021-09-16 | Disposition: A | Discharge status: Home / Self Care | Payer: PPO | Source: Ambulatory Visit | Attending: Radiation Oncology | Admitting: Radiation Oncology

## 2021-09-16 ENCOUNTER — Other Ambulatory Visit: Payer: Self-pay

## 2021-09-16 ENCOUNTER — Other Ambulatory Visit: Payer: Self-pay | Admitting: Physician Assistant

## 2021-09-16 ENCOUNTER — Telehealth: Payer: Self-pay | Admitting: Emergency Medicine

## 2021-09-16 DIAGNOSIS — Z510 Encounter for antineoplastic radiation therapy: Secondary | ICD-10-CM | POA: Diagnosis not present

## 2021-09-16 DIAGNOSIS — C3432 Malignant neoplasm of lower lobe, left bronchus or lung: Secondary | ICD-10-CM | POA: Diagnosis not present

## 2021-09-16 DIAGNOSIS — C3490 Malignant neoplasm of unspecified part of unspecified bronchus or lung: Secondary | ICD-10-CM

## 2021-09-16 DIAGNOSIS — D649 Anemia, unspecified: Secondary | ICD-10-CM

## 2021-09-16 DIAGNOSIS — R7303 Prediabetes: Secondary | ICD-10-CM | POA: Diagnosis not present

## 2021-09-16 NOTE — Telephone Encounter (Signed)
A Randomized pragmatic Chair-Based Home Exercise Intervention for Mitigating Cancer-Related Fatigue in Older Adults Undergoing Chemotherapy for Advanced Disease  09/16/21 - Week 5 Coaching Call  Called the patient to complete week 5 coaching phone call.  The patient states she has not been able to complete the exercises since the last time we met in the clinic.  She state she has been more fatigued and is having difficulties performing daily activities.    Scheduled to meet with the patient tomorrow, 09/17/21  at approximately 10:00am after her radiation appointment to complete week 6 questionnaires and assessments.  She was reminded to bring in her home exercise log with her to this appointment.  The patient's chemotherapy has been discontinued early due to decreased neutrophil counts.  The patient denied questions at this time.  [name] Clinical Research Coordinator I  09/16/21  11:00 AM

## 2021-09-17 ENCOUNTER — Inpatient Hospital Stay: Payer: PPO

## 2021-09-17 ENCOUNTER — Inpatient Hospital Stay: Payer: PPO | Admitting: Dietician

## 2021-09-17 ENCOUNTER — Ambulatory Visit
Admission: RE | Admit: 2021-09-17 | Discharge: 2021-09-17 | Disposition: A | Discharge status: Home / Self Care | Payer: PPO | Source: Ambulatory Visit | Attending: Radiation Oncology | Admitting: Radiation Oncology

## 2021-09-17 ENCOUNTER — Inpatient Hospital Stay: Payer: PPO | Admitting: Emergency Medicine

## 2021-09-17 DIAGNOSIS — Z510 Encounter for antineoplastic radiation therapy: Secondary | ICD-10-CM | POA: Diagnosis not present

## 2021-09-17 DIAGNOSIS — C3490 Malignant neoplasm of unspecified part of unspecified bronchus or lung: Secondary | ICD-10-CM

## 2021-09-17 DIAGNOSIS — R7303 Prediabetes: Secondary | ICD-10-CM | POA: Diagnosis not present

## 2021-09-17 DIAGNOSIS — C3432 Malignant neoplasm of lower lobe, left bronchus or lung: Secondary | ICD-10-CM | POA: Diagnosis not present

## 2021-09-17 NOTE — Research (Signed)
A Randomized pragmatic Chair-Based Home Exercise Intervention for Mitigating Cancer-Related Fatigue in Older Adults Undergoing Chemotherapy for Advanced Disease  09/17/21 - Week 6 visit  The patient presented to the clinic today for her appointment with radiation.  PROs:  The patient completed the required baseline PROs (PROMIS Fatigue short form 7a, CES-D) per protocol.  Patient scored 23 on CES-D.  Patient's physician, Dr. Julien Nordmann, notified.   SPPB:  The patient was able to stand without assistance.  She completed the side by side stand and semi-tandem stand holding for 10 seconds on each without assistance.  She was only able to hold the tandem stand for 3.47 seconds.  The patient did not feel safe to walk for the gait speed test in the hallway without assistance due to her imbalance.  She is using a wheelchair while here at the cancer center.  The patient was able to stand up from her chair without using her arms.  She completed the repeated chair stand in 29.84 seconds.  Exercise Education:  The patient state she has not been able to perform exercises recently due to fatigue and pain in her back.  The patient states she is hopeful that she will be able to start exercising again.    The patient was given a new copy of the exercise education brochure as the patient stated she has misplaced her previous copy.  Encouraged to start slow with goal of increasing exercise to 30 minutes per day, 5 days per week.    Collected home exercise logs from the patient for the weeks which have been completed, and gave enough logs to last the remainder of the study.  Patient was instructed to fill out exercise log daily with minutes exercised and RPE (Rate of Perceived Exertion).  Gift Card:  The patient was given a $25 [employer] gift card per protocol.  Plan:  Will continue to call the patient every two weeks for coaching reminder calls.  The next in person research assessment will be at 12 months from  enrollment.  The patient was thanked for her time and participation in this research study.  She was provided with this coordinator's contact information and was advised to call with any questions.   [name] Clinical Research Coordinator I  09/17/21 10:56 AM

## 2021-09-17 NOTE — Progress Notes (Signed)
Nutrition Assessment   Reason for Assessment: Provider request (esophagitis)    ASSESSMENT: 86 year old female with non-small cell lung cancer. She is receiving concurrent chemoradiation with weekly carboplatin/paclitaxel. Patient followed by Dr. Earlie Server and Dr. Sondra Come. Final radiation scheduled for 1/25.   Met with patient in office. She reports reflux and pain with swallowing. She reports taking small bites and sips. She is drinking 2 Ensure Original. They take her all day to drink. Patient reports taking Carafate and Nexium. This is helping some. Patient reports gaining 30 lbs prior to starting treatment "thanks to [employer] ice cream."    Nutrition Focused Physical Exam: deferred    Medications: Nexium, Carafate, Compazine, D3, Ferrous sulfate, Flexeril, Cranberry, Colace, Synthroid   Labs: 1/17 - glucose 196, BUN 30, Cr 1.56   Anthropometrics:   Height: 5'6" Weight: 168 lb (1/18) UBW: 140 lb (per pt, reports 30 lb wt gain prior to treatment) BMI: 27.12    NUTRITION DIAGNOSIS: Inadequate oral intake related to treatment side effects (radiation induced esophagitis) as evidenced by reported decreased intake and painful swallow   INTERVENTION:  Educated soft moist high protein foods - handout provided  Discussed ways to alter texture of foods for ease of intake - handout and shake recipes provided  Continue drinking Ensure, suggested increasing to 3/day and switching to Ensure Plus/equivalent for added calories (samples and coupons provided) Continue taking carafate  Provided information on [place] support services available including support groups, cooking class, massage Contact information given   MONITORING, EVALUATION, GOAL: Patient will tolerate increased calories and protein to minimize weight loss    Next Visit: To be scheduled as needed

## 2021-09-18 ENCOUNTER — Ambulatory Visit
Admission: RE | Admit: 2021-09-18 | Discharge: 2021-09-18 | Disposition: A | Discharge status: Home / Self Care | Payer: PPO | Source: Ambulatory Visit | Attending: Radiation Oncology | Admitting: Radiation Oncology

## 2021-09-18 ENCOUNTER — Other Ambulatory Visit: Payer: Self-pay

## 2021-09-18 ENCOUNTER — Encounter: Payer: Self-pay | Admitting: Radiation Oncology

## 2021-09-18 DIAGNOSIS — Z510 Encounter for antineoplastic radiation therapy: Secondary | ICD-10-CM | POA: Diagnosis not present

## 2021-09-18 DIAGNOSIS — R7303 Prediabetes: Secondary | ICD-10-CM | POA: Diagnosis not present

## 2021-09-18 DIAGNOSIS — C3432 Malignant neoplasm of lower lobe, left bronchus or lung: Secondary | ICD-10-CM | POA: Diagnosis not present

## 2021-09-23 ENCOUNTER — Encounter: Payer: Self-pay | Admitting: Podiatry

## 2021-09-23 ENCOUNTER — Ambulatory Visit: Payer: PPO | Admitting: Podiatry

## 2021-09-23 ENCOUNTER — Other Ambulatory Visit: Payer: Self-pay

## 2021-09-23 DIAGNOSIS — B351 Tinea unguium: Secondary | ICD-10-CM

## 2021-09-23 DIAGNOSIS — M79675 Pain in left toe(s): Secondary | ICD-10-CM

## 2021-09-23 DIAGNOSIS — M79674 Pain in right toe(s): Secondary | ICD-10-CM

## 2021-09-23 NOTE — Progress Notes (Signed)
Subjective:   Patient ID: Abigail [name], female   DOB: 86 y.o.   MRN: [id number]   HPI Patient presents stating she is been treated for lung cancer with chemo and radiation has developed very thick toenails that she cannot take care of herself and become painful and she has inability to cut.  She does not smoke and does like to be active if she can but does not have energy currently   Review of Systems  All other systems reviewed and are negative.      Objective:  Physical Exam Vitals and nursing note reviewed.  Constitutional:      Appearance: She is well-developed.  Pulmonary:     Effort: Pulmonary effort is normal.  Musculoskeletal:        General: Normal range of motion.  Skin:    General: Skin is warm.  Neurological:     Mental Status: She is alert.    Neurovascular status was found to be mildly diminished but intact with patient found to have severely thickened incurvated painful nailbeds 1-5 both feet that are difficult to wear shoe gear with     Assessment:  Chronic mycotic nail infection 1-5 both feet with pain     Plan:  Debridement painful nailbeds 1-5 both feet no iatrogenic bleeding noted and educated her on foot structure and daily foot inspections and continue treatment

## 2021-09-24 ENCOUNTER — Inpatient Hospital Stay: Payer: PPO

## 2021-09-24 ENCOUNTER — Inpatient Hospital Stay: Payer: PPO | Admitting: Internal Medicine

## 2021-10-01 ENCOUNTER — Telehealth: Payer: Self-pay | Admitting: Emergency Medicine

## 2021-10-01 NOTE — Telephone Encounter (Signed)
A Randomized pragmatic Chair-Based Home Exercise Intervention for Mitigating Cancer-Related Fatigue in Older Adults Undergoing Chemotherapy for Advanced Disease  10/01/21 - Week 7 Coaching Call  Called the patient to complete week 7 coaching phone call.  The patient states she has still not been able to complete the exercises since the last time we met in the clinic.  She states her fatigue and pain is starting to improve slightly, but she is still very fatigued and she is not currently very active.  She hopes she will be able to increase her activity and start doing the study exercises soon.  Encouraged to start performing exercises very gradually and in small amounts to build up tolerance to activity.    The patient denied questions at this time.  [name] Clinical Research Coordinator I  10/01/21  1:10 PM

## 2021-10-04 ENCOUNTER — Encounter: Payer: Self-pay | Admitting: Obstetrics and Gynecology

## 2021-10-04 ENCOUNTER — Ambulatory Visit (INDEPENDENT_AMBULATORY_CARE_PROVIDER_SITE_OTHER): Payer: PPO | Admitting: Obstetrics and Gynecology

## 2021-10-04 ENCOUNTER — Other Ambulatory Visit: Payer: Self-pay

## 2021-10-04 VITALS — BP 119/79 | HR 86

## 2021-10-04 DIAGNOSIS — N3281 Overactive bladder: Secondary | ICD-10-CM | POA: Diagnosis not present

## 2021-10-04 DIAGNOSIS — N390 Urinary tract infection, site not specified: Secondary | ICD-10-CM | POA: Diagnosis not present

## 2021-10-04 MED ORDER — TROSPIUM CHLORIDE ER 60 MG PO CP24: 60.0000 mg | ORAL_CAPSULE | Freq: Every day | ORAL | 11 refills | Status: DC

## 2021-10-04 NOTE — Progress Notes (Signed)
[facility] Return Visit  SUBJECTIVE  History of Present Illness: Abigail [name] is a 86 y.o. female seen in follow-up for overactive bladder.  Surgery: s/p anterior and posterior repair with perineorrhaphy, sacrospinous ligament fixation, midurethral sling and cystoscopy on 12/20/2020  Taking the Trospium 60mg . The medication has become more expensive for her- $100 copay. Feels that the medication is working well for her. It has reduced her urgency and frequency.  Also taking daily bactrim for UTI prevention. Has not had any recent urinary tract infections. Also using estradiol twice weekly.   Has been going through chemotherapy and radiation treatments for lung cancer.  Stage IIIA (T2a, N2, M0) non-small cell lung cancer, adenocarcinoma diagnosed in November 2022.  Past Medical History: Patient  has a past medical history of Anemia, Arthritis, Carotid artery occlusion, Chronic kidney disease, Complete uterine prolapse with prolapse of anterior vaginal wall, Complication of anesthesia, Constipation, Coronary artery disease, Diverticulitis (yrs ago coialitis), Dyspnea, History of blood transfusion, Hypertension, Hypothyroid, Numbness, Pneumonia, Pre-diabetes, Scoliosis, STEMI (ST elevation myocardial infarction) ([place]) (10/26/2019), Wears dentures, and Wears glasses.   Past Surgical History: She  has a past surgical history that includes Elbow surgery (1990's); Coronary/Graft Acute MI Revascularization (N/A, 10/26/2019); LEFT HEART CATH AND CORONARY ANGIOGRAPHY (N/A, 10/26/2019); Back surgery (1980 age 49); bartholin cyst removal (age 25's); Anterior and posterior repair with sacrospinous fixation (N/A, 12/20/2020); Cystocele repair (N/A, 12/20/2020); Bladder suspension (N/A, 12/20/2020); Cystoscopy (N/A, 12/20/2020); Tonsillectomy; Endobronchial ultrasound (N/A, 07/15/2021); Hemostasis control (07/15/2021); Bronchial brushings (07/15/2021); and Bronchial needle aspiration biopsy  (07/15/2021).   Medications: She has a current medication list which includes the following prescription(s): acetaminophen, albuterol, aspirin ec, atorvastatin, cholecalciferol, cranberry, cyclobenzaprine, docusate sodium, esomeprazole, estradiol, ezetimibe, ferrous sulfate, levothyroxine, lisinopril-hydrochlorothiazide, nitroglycerin, prochlorperazine, sucralfate, sulfamethoxazole-trimethoprim, and trospium chloride.   Allergies: Patient is allergic to codeine and norvasc [amlodipine].   Social History: Patient  reports that she quit smoking about 35 years ago. Her smoking use included cigarettes. She has a 52.50 pack-year smoking history. She has never used smokeless tobacco. She reports that she does not drink alcohol and does not use drugs.      OBJECTIVE     Physical Exam: Vitals:   10/04/21 1136  BP: 119/79  Pulse: 86    Gen: No apparent distress, A&O x 3.  Back: tenderness over left tailbone extending over left buttock   ASSESSMENT AND PLAN    Abigail [name] is a 86 y.o. with:  1. Recurrent UTI   2. Overactive bladder     - Can discontinue daily antibiotic for prophylaxis. Continue with Estrace cream twice weekly for UTI prevention.  - continue with Trospium 60mg  daily. GoodRx cost savings card provided. Refills provided for 1 year.  - Has not been able to go to PT due to cancer treatments. If she has time to go she will let us know and we can give a new referral.  - Return 1 year or sooner if needed  [name], MD  Time spent: I spent 20 minutes dedicated to the care of this patient on the date of this encounter to include pre-visit review of records, face-to-face time with the patient and post visit documentation.

## 2021-10-10 ENCOUNTER — Inpatient Hospital Stay: Payer: PPO | Attending: Internal Medicine

## 2021-10-10 ENCOUNTER — Other Ambulatory Visit: Payer: Self-pay

## 2021-10-10 ENCOUNTER — Ambulatory Visit (HOSPITAL_COMMUNITY)
Admission: RE | Admit: 2021-10-10 | Discharge: 2021-10-10 | Disposition: A | Discharge status: Home / Self Care | Payer: PPO | Source: Ambulatory Visit | Attending: Internal Medicine | Admitting: Internal Medicine

## 2021-10-10 DIAGNOSIS — N200 Calculus of kidney: Secondary | ICD-10-CM | POA: Insufficient documentation

## 2021-10-10 DIAGNOSIS — C3490 Malignant neoplasm of unspecified part of unspecified bronchus or lung: Secondary | ICD-10-CM | POA: Diagnosis not present

## 2021-10-10 DIAGNOSIS — Z888 Allergy status to other drugs, medicaments and biological substances status: Secondary | ICD-10-CM | POA: Insufficient documentation

## 2021-10-10 DIAGNOSIS — C3432 Malignant neoplasm of lower lobe, left bronchus or lung: Secondary | ICD-10-CM | POA: Insufficient documentation

## 2021-10-10 DIAGNOSIS — M419 Scoliosis, unspecified: Secondary | ICD-10-CM | POA: Insufficient documentation

## 2021-10-10 DIAGNOSIS — E039 Hypothyroidism, unspecified: Secondary | ICD-10-CM | POA: Insufficient documentation

## 2021-10-10 DIAGNOSIS — I252 Old myocardial infarction: Secondary | ICD-10-CM | POA: Insufficient documentation

## 2021-10-10 DIAGNOSIS — R5383 Other fatigue: Secondary | ICD-10-CM | POA: Insufficient documentation

## 2021-10-10 DIAGNOSIS — R911 Solitary pulmonary nodule: Secondary | ICD-10-CM | POA: Diagnosis not present

## 2021-10-10 DIAGNOSIS — J439 Emphysema, unspecified: Secondary | ICD-10-CM | POA: Diagnosis not present

## 2021-10-10 DIAGNOSIS — Z79899 Other long term (current) drug therapy: Secondary | ICD-10-CM | POA: Insufficient documentation

## 2021-10-10 DIAGNOSIS — M47814 Spondylosis without myelopathy or radiculopathy, thoracic region: Secondary | ICD-10-CM | POA: Insufficient documentation

## 2021-10-10 DIAGNOSIS — K449 Diaphragmatic hernia without obstruction or gangrene: Secondary | ICD-10-CM | POA: Insufficient documentation

## 2021-10-10 DIAGNOSIS — K209 Esophagitis, unspecified without bleeding: Secondary | ICD-10-CM | POA: Insufficient documentation

## 2021-10-10 DIAGNOSIS — I700 Atherosclerosis of aorta: Secondary | ICD-10-CM | POA: Diagnosis not present

## 2021-10-10 DIAGNOSIS — D649 Anemia, unspecified: Secondary | ICD-10-CM

## 2021-10-10 DIAGNOSIS — R1310 Dysphagia, unspecified: Secondary | ICD-10-CM | POA: Insufficient documentation

## 2021-10-10 DIAGNOSIS — R0609 Other forms of dyspnea: Secondary | ICD-10-CM | POA: Insufficient documentation

## 2021-10-10 DIAGNOSIS — J432 Centrilobular emphysema: Secondary | ICD-10-CM | POA: Insufficient documentation

## 2021-10-10 DIAGNOSIS — Z885 Allergy status to narcotic agent status: Secondary | ICD-10-CM | POA: Insufficient documentation

## 2021-10-10 DIAGNOSIS — I129 Hypertensive chronic kidney disease with stage 1 through stage 4 chronic kidney disease, or unspecified chronic kidney disease: Secondary | ICD-10-CM | POA: Insufficient documentation

## 2021-10-10 DIAGNOSIS — N183 Chronic kidney disease, stage 3 unspecified: Secondary | ICD-10-CM | POA: Insufficient documentation

## 2021-10-10 DIAGNOSIS — I2510 Atherosclerotic heart disease of native coronary artery without angina pectoris: Secondary | ICD-10-CM | POA: Insufficient documentation

## 2021-10-10 LAB — IRON AND IRON BINDING CAPACITY (CC-WL,HP ONLY)
Iron: 47 ug/dL (ref 28–170)
Saturation Ratios: 12 % (ref 10.4–31.8)
TIBC: 378 ug/dL (ref 250–450)
UIBC: 331 ug/dL (ref 148–442)

## 2021-10-10 LAB — CBC WITH DIFFERENTIAL (CANCER CENTER ONLY)
Abs Immature Granulocytes: 0.04 10*3/uL (ref 0.00–0.07)
Basophils Absolute: 0 10*3/uL (ref 0.0–0.1)
Basophils Relative: 0 %
Eosinophils Absolute: 0.2 10*3/uL (ref 0.0–0.5)
Eosinophils Relative: 3 %
HCT: 31.7 % — ABNORMAL LOW (ref 36.0–46.0)
Hemoglobin: 9.7 g/dL — ABNORMAL LOW (ref 12.0–15.0)
Immature Granulocytes: 1 %
Lymphocytes Relative: 5 %
Lymphs Abs: 0.2 10*3/uL — ABNORMAL LOW (ref 0.7–4.0)
MCH: 29.7 pg (ref 26.0–34.0)
MCHC: 30.6 g/dL (ref 30.0–36.0)
MCV: 96.9 fL (ref 80.0–100.0)
Monocytes Absolute: 0.4 10*3/uL (ref 0.1–1.0)
Monocytes Relative: 7 %
Neutro Abs: 4.1 10*3/uL (ref 1.7–7.7)
Neutrophils Relative %: 84 %
Platelet Count: 236 10*3/uL (ref 150–400)
RBC: 3.27 MIL/uL — ABNORMAL LOW (ref 3.87–5.11)
RDW: 18.8 % — ABNORMAL HIGH (ref 11.5–15.5)
WBC Count: 4.9 10*3/uL (ref 4.0–10.5)
nRBC: 0 % (ref 0.0–0.2)

## 2021-10-10 LAB — CMP (CANCER CENTER ONLY)
ALT: 8 U/L (ref 0–44)
AST: 12 U/L — ABNORMAL LOW (ref 15–41)
Albumin: 4 g/dL (ref 3.5–5.0)
Alkaline Phosphatase: 87 U/L (ref 38–126)
Anion gap: 6 (ref 5–15)
BUN: 26 mg/dL — ABNORMAL HIGH (ref 8–23)
CO2: 26 mmol/L (ref 22–32)
Calcium: 9.3 mg/dL (ref 8.9–10.3)
Chloride: 106 mmol/L (ref 98–111)
Creatinine: 1.22 mg/dL — ABNORMAL HIGH (ref 0.44–1.00)
GFR, Estimated: 44 mL/min — ABNORMAL LOW (ref >60)
Glucose, Bld: 204 mg/dL — ABNORMAL HIGH (ref 70–99)
Potassium: 4.3 mmol/L (ref 3.5–5.1)
Sodium: 138 mmol/L (ref 135–145)
Total Bilirubin: 0.3 mg/dL (ref 0.3–1.2)
Total Protein: 7.1 g/dL (ref 6.5–8.1)

## 2021-10-10 LAB — FERRITIN: Ferritin: 36 ng/mL (ref 11–307)

## 2021-10-14 ENCOUNTER — Telehealth: Payer: Self-pay | Admitting: Emergency Medicine

## 2021-10-14 NOTE — Telephone Encounter (Signed)
A Randomized pragmatic Chair-Based Home Exercise Intervention for Mitigating Cancer-Related Fatigue in Older Adults Undergoing Chemotherapy for Advanced Disease  10/14/21 - Week 9 Coaching Call  Called the patient to complete week 9 coaching phone call.  The patient states she has still not been able to complete the exercises since the last call.  The patient states she is still feeling very fatigued.  Encouraged to start performing exercises very gradually and in small amounts as she is able to build up tolerance to activity.  Schedule to complete week 12 assessments next Thursday, 3/2, while patient is in the clinic to see Dr. Sondra Come.  The patient denied questions at this time.   [name] Clinical Research Coordinator I  10/14/21  3:34 PM

## 2021-10-15 ENCOUNTER — Telehealth: Payer: Self-pay | Admitting: Medical Oncology

## 2021-10-15 ENCOUNTER — Telehealth: Payer: Self-pay

## 2021-10-15 NOTE — Telephone Encounter (Signed)
Asking for results of scans -she cannot be with pt tomorrow at appt.

## 2021-10-15 NOTE — Telephone Encounter (Signed)
Pts daughter Claiborne Billings called back and left another message stating she wants to know "what all is going on with my mother" and has questions and we "should not expect for her elderly and sick mother to turn on her cell phone" to participate in on the appt. She further stated that she wants to speak to the doctor alone to have her questions addressed.  I have called Claiborne Billings back and left another message for her advising the pt doesn't need to use her personal phone for the appt as we make phones available for these unique situations, however, if she is not able to attend the appt, we are happy to address her questions and to please call us back with them or communicate them via [place].

## 2021-10-15 NOTE — Telephone Encounter (Signed)
Pts daughter Claiborne Billings advised she is not able to attend her appt tomorrow and wants Dr. Julien Nordmann to call her as well with her imaging results.  I have called Claiborne Billings back and left a detailed message advising we are not able to complete a second visit to re-discuss pts visit, however we are able to call her on the phone so she is present for the results and able to ask any questions during the patients visit.

## 2021-10-16 ENCOUNTER — Telehealth: Payer: Self-pay

## 2021-10-16 ENCOUNTER — Other Ambulatory Visit: Payer: Self-pay

## 2021-10-16 ENCOUNTER — Inpatient Hospital Stay: Payer: PPO | Admitting: Internal Medicine

## 2021-10-16 VITALS — BP 144/66 | HR 89 | Temp 97.0°F | Resp 19 | Ht 66.0 in | Wt 163.5 lb

## 2021-10-16 DIAGNOSIS — Z885 Allergy status to narcotic agent status: Secondary | ICD-10-CM | POA: Diagnosis not present

## 2021-10-16 DIAGNOSIS — R1310 Dysphagia, unspecified: Secondary | ICD-10-CM | POA: Diagnosis not present

## 2021-10-16 DIAGNOSIS — I700 Atherosclerosis of aorta: Secondary | ICD-10-CM | POA: Diagnosis not present

## 2021-10-16 DIAGNOSIS — C3432 Malignant neoplasm of lower lobe, left bronchus or lung: Secondary | ICD-10-CM | POA: Diagnosis not present

## 2021-10-16 DIAGNOSIS — M47814 Spondylosis without myelopathy or radiculopathy, thoracic region: Secondary | ICD-10-CM | POA: Diagnosis not present

## 2021-10-16 DIAGNOSIS — C3490 Malignant neoplasm of unspecified part of unspecified bronchus or lung: Secondary | ICD-10-CM

## 2021-10-16 DIAGNOSIS — E039 Hypothyroidism, unspecified: Secondary | ICD-10-CM | POA: Diagnosis not present

## 2021-10-16 DIAGNOSIS — K449 Diaphragmatic hernia without obstruction or gangrene: Secondary | ICD-10-CM | POA: Diagnosis not present

## 2021-10-16 DIAGNOSIS — N200 Calculus of kidney: Secondary | ICD-10-CM | POA: Diagnosis not present

## 2021-10-16 DIAGNOSIS — Z79899 Other long term (current) drug therapy: Secondary | ICD-10-CM | POA: Diagnosis not present

## 2021-10-16 DIAGNOSIS — I129 Hypertensive chronic kidney disease with stage 1 through stage 4 chronic kidney disease, or unspecified chronic kidney disease: Secondary | ICD-10-CM | POA: Diagnosis not present

## 2021-10-16 DIAGNOSIS — M419 Scoliosis, unspecified: Secondary | ICD-10-CM | POA: Diagnosis not present

## 2021-10-16 DIAGNOSIS — I2510 Atherosclerotic heart disease of native coronary artery without angina pectoris: Secondary | ICD-10-CM | POA: Diagnosis not present

## 2021-10-16 DIAGNOSIS — J432 Centrilobular emphysema: Secondary | ICD-10-CM | POA: Diagnosis not present

## 2021-10-16 DIAGNOSIS — R0609 Other forms of dyspnea: Secondary | ICD-10-CM | POA: Diagnosis not present

## 2021-10-16 DIAGNOSIS — R5383 Other fatigue: Secondary | ICD-10-CM | POA: Diagnosis not present

## 2021-10-16 DIAGNOSIS — N183 Chronic kidney disease, stage 3 unspecified: Secondary | ICD-10-CM | POA: Diagnosis not present

## 2021-10-16 DIAGNOSIS — Z888 Allergy status to other drugs, medicaments and biological substances status: Secondary | ICD-10-CM | POA: Diagnosis not present

## 2021-10-16 DIAGNOSIS — K209 Esophagitis, unspecified without bleeding: Secondary | ICD-10-CM | POA: Diagnosis not present

## 2021-10-16 DIAGNOSIS — I252 Old myocardial infarction: Secondary | ICD-10-CM | POA: Diagnosis not present

## 2021-10-16 NOTE — Telephone Encounter (Signed)
Patient notified of completion of [employer] Cancer Claim Form and Wellness Form. No signature was required from Provider, only the [employer].Instructions placed on form for patient to sign. Release of Information Form is included. Patient has copies of records requested by [employer]. Patient given phone number [phone]) to call [place] Department for itemized copies of chemotherapy and radiation bills as requested on forms. Patient verbalized understanding. Copies of forms mailed to Patient at the requested address ([address] [address]). No further needs verbalized at this time.

## 2021-10-16 NOTE — Progress Notes (Signed)
[facility] Telephone:(336) [phone]   Fax:(336) [phone]  OFFICE PROGRESS NOTE  Abigail [name], [address] [place] [address]  DIAGNOSIS: Stage IIIA (T2a, N2, M0) non-small cell lung cancer, adenocarcinoma presented with left lower lobe lung mass in addition to left hilar and mediastinal lymphadenopathy diagnosed in November 2022 with no actionable mutation on the guardant blood test   PRIOR THERAPY: Weekly concurrent chemoradiation with carboplatin for an AUC of 2 and paclitaxel 45 mg per metered squared.  First dose on 08/05/2021.  Status post 6 cycles.  Last dose was given on September 03, 2021.   CURRENT THERAPY: Consolidation treatment with immunotherapy with Imfinzi 1500 Mg IV every 4 weeks.  First dose October 23, 2021  INTERVAL HISTORY: Abigail [name] 86 y.o. female returns to the clinic today for follow-up visit.  Her daughter Claiborne Billings was available by phone during the visit.  She was not pleasant and very aggressive in her approach during the visit today.  She requested a call last night independently from her mom's visit today and when I ask it about the reason for the call and if it was an urgent matter or concerning issues she did not give any specific reply.  The patient is feeling fine today except for the baseline shortness of breath with mild cough.  Her swallowing has improved.  She denied having any recent weight loss.  She has no nausea, vomiting, diarrhea or constipation.  She has no headache or visual changes.  The patient had repeat CT scan of the chest performed recently and she is here for evaluation and discussion of her scan results and treatment options.  MEDICAL HISTORY: Past Medical History:  Diagnosis Date   Anemia    Arthritis    knees, back   Carotid artery occlusion    Chronic kidney disease    stage 3 ckd no nephrologist   Complete uterine prolapse with prolapse of anterior vaginal wall    Complication of anesthesia     hard to wake up per pt   Constipation    Coronary artery disease    Diverticulitis yrs ago coialitis   Dyspnea    History of blood transfusion    Hypertension    Hypothyroid    Numbness    in hands at times   Pneumonia    Pre-diabetes    Scoliosis    STEMI (ST elevation myocardial infarction) ([place]) 10/26/2019   DES RCA   Wears dentures    full dentures   Wears glasses    for reading    ALLERGIES:  is allergic to codeine and norvasc [amlodipine].  MEDICATIONS:  Current Outpatient Medications  Medication Sig Dispense Refill   acetaminophen (TYLENOL) 325 MG tablet Take 2 tablets (650 mg total) by mouth every 6 (six) hours as needed for mild pain (or Fever >/= 101).     albuterol (VENTOLIN HFA) 108 (90 Base) MCG/ACT inhaler Inhale 2 puffs into the lungs every 6 (six) hours as needed for wheezing or shortness of breath. 8 g 0   aspirin EC 81 MG tablet Take 81 mg by mouth at bedtime.     atorvastatin (LIPITOR) 80 MG tablet Take 1 tablet (80 mg total) by mouth daily at 6 PM. 90 tablet 3   Cholecalciferol (VITAMIN D3 PO) Take 2 capsules by mouth daily.     Cranberry 1000 MG CAPS Take 1,000 mg by mouth daily at 12 noon.  cyclobenzaprine (FLEXERIL) 10 MG tablet Take 1 tablet (10 mg total) by mouth 3 (three) times daily as needed for muscle spasms. 30 tablet 1   docusate sodium (COLACE) 100 MG capsule Take 100-200 mg by mouth See admin instructions. Take 100 mg in the morning and 200 mg at bedtime     esomeprazole (NEXIUM) 20 MG capsule Take 1 capsule (20 mg total) by mouth 2 (two) times daily before a meal. 60 capsule 3   estradiol (ESTRACE) 0.1 MG/GM vaginal cream Place 0.5 g vaginally once a week. Place 0.5g twice a week at opening of vagina     ezetimibe (ZETIA) 10 MG tablet Take 1 tablet (10 mg total) by mouth daily. 90 tablet 3   ferrous sulfate 300 (60 Fe) MG/5ML syrup Take 5 mLs (300 mg total) by mouth 2 (two) times daily with a meal. 300 mL 11   levothyroxine (SYNTHROID) 175  MCG tablet TAKE 1 TABLET BY MOUTH EVERY MORNING ON MONDAY - FRIDAY 90 tablet 1   lisinopril-hydrochlorothiazide (ZESTORETIC) 20-25 MG tablet Take 0.5 tablets by mouth 3 (three) times a week. Take on Monday Wednesday and Friday only 90 tablet 1   nitroGLYCERIN (NITROSTAT) 0.4 MG SL tablet Place 1 tablet (0.4 mg total) under the tongue every 5 (five) minutes x 3 doses as needed for chest pain. 25 tablet 3   prochlorperazine (COMPAZINE) 10 MG tablet Take 1 tablet (10 mg total) by mouth every 6 (six) hours as needed for nausea or vomiting. 30 tablet 2   sucralfate (CARAFATE) 1 g tablet Take 1 tablet (1 g total) by mouth 4 (four) times daily -  with meals and at bedtime. Crush and dissolve in 10 mL of warm water, swallow prior to meals 120 tablet 1   sulfamethoxazole-trimethoprim (BACTRIM) 400-80 MG tablet Take 0.5 tablets by mouth daily. 30 tablet 5   Trospium Chloride 60 MG CP24 Take 1 capsule (60 mg total) by mouth daily. 30 capsule 11   No current facility-administered medications for this visit.    SURGICAL HISTORY:  Past Surgical History:  Procedure Laterality Date   ANTERIOR AND POSTERIOR REPAIR WITH SACROSPINOUS FIXATION N/A 12/20/2020   Procedure: SACROSPINOUS LIGAMENT FIXATION;  Surgeon: [name], MD;  Location: [facility];  Service: Gynecology;  Laterality: N/A;  Total time requested for all procedures is 2 hours   [place] age 69   lower   bartholin cyst removal  age 79's   BLADDER SUSPENSION N/A 12/20/2020   Procedure: TRANSVAGINAL TAPE (TVT) PROCEDURE;  Surgeon: [name], MD;  Location: [facility];  Service: Gynecology;  Laterality: N/A;   BRONCHIAL BRUSHINGS  07/15/2021   Procedure: BRONCHIAL BRUSHINGS;  Surgeon: Collene Gobble, MD;  Location: [facility] ENDOSCOPY;  Service: Pulmonary;;   BRONCHIAL NEEDLE ASPIRATION BIOPSY  07/15/2021   Procedure: BRONCHIAL NEEDLE ASPIRATION BIOPSIES;  Surgeon: Collene Gobble, MD;   Location: [facility]OSCOPY;  Service: Pulmonary;;   CORONARY/GRAFT ACUTE MI REVASCULARIZATION N/A 10/26/2019   Procedure: Coronary/Graft Acute MI Revascularization;  Surgeon: Sherren Mocha, MD;  Location: [facility];  Service: Cardiovascular;  Laterality: N/A;   CYSTOCELE REPAIR N/A 12/20/2020   Procedure: ANTERIOR AND POSTERIOR REPAIR WITH PERINEORRHAPHY;  Surgeon: [name], MD;  Location: [facility];  Service: Gynecology;  Laterality: N/A;   CYSTOSCOPY N/A 12/20/2020   Procedure: CYSTOSCOPY;  Surgeon: [name], MD;  Location: [facility];  Service: Gynecology;  Laterality: N/A;  ELBOW SURGERY  1990's   left   ENDOBRONCHIAL ULTRASOUND N/A 07/15/2021   Procedure: ENDOBRONCHIAL ULTRASOUND;  Surgeon: Collene Gobble, MD;  Location: [facility] ENDOSCOPY;  Service: Pulmonary;  Laterality: N/A;   HEMOSTASIS CONTROL  07/15/2021   Procedure: HEMOSTASIS CONTROL;  Surgeon: Collene Gobble, MD;  Location: [facility] ENDOSCOPY;  Service: Pulmonary;;   LEFT HEART CATH AND CORONARY ANGIOGRAPHY N/A 10/26/2019   Procedure: LEFT HEART CATH AND CORONARY ANGIOGRAPHY;  Surgeon: Sherren Mocha, MD;  Location: [facility];  Service: Cardiovascular;  Laterality: N/A;   TONSILLECTOMY      REVIEW OF SYSTEMS:  Constitutional: positive for fatigue Eyes: negative Ears, nose, mouth, throat, and face: negative Respiratory: positive for dyspnea on exertion Cardiovascular: negative Gastrointestinal: negative Genitourinary:negative Integument/breast: negative Hematologic/lymphatic: negative Musculoskeletal:negative Neurological: negative Behavioral/Psych: negative Endocrine: negative Allergic/Immunologic: negative   PHYSICAL EXAMINATION: General appearance: alert, cooperative, fatigued, and no distress Head: Normocephalic, without obvious abnormality, atraumatic Neck: no adenopathy, no JVD, supple, symmetrical, trachea midline, and thyroid not enlarged,  symmetric, no tenderness/mass/nodules Lymph nodes: Cervical, supraclavicular, and axillary nodes normal. Resp: clear to auscultation bilaterally Back: symmetric, no curvature. ROM normal. No CVA tenderness. Cardio: regular rate and rhythm, S1, S2 normal, no murmur, click, rub or gallop GI: soft, non-tender; bowel sounds normal; no masses,  no organomegaly Extremities: extremities normal, atraumatic, no cyanosis or edema Neurologic: Alert and oriented X 3, normal strength and tone. Normal symmetric reflexes. Normal coordination and gait  ECOG PERFORMANCE STATUS: 1 - Symptomatic but completely ambulatory  Blood pressure (!) 144/66, pulse 89, temperature (!) 97 F (36.1 C), temperature source Tympanic, resp. rate 19, height 5\' 6"  (1.676 m), weight 163 lb 8 oz (74.2 kg), SpO2 94 %.  LABORATORY DATA: Lab Results  Component Value Date   WBC 4.9 10/10/2021   HGB 9.7 (L) 10/10/2021   HCT 31.7 (L) 10/10/2021   MCV 96.9 10/10/2021   PLT 236 10/10/2021      Chemistry      Component Value Date/Time   NA 138 10/10/2021 1104   NA 137 08/28/2021 0923   K 4.3 10/10/2021 1104   CL 106 10/10/2021 1104   CO2 26 10/10/2021 1104   BUN 26 (H) 10/10/2021 1104   BUN 20 08/28/2021 0923   CREATININE 1.22 (H) 10/10/2021 1104   CREATININE 1.08 04/18/2014 1516   GLU 104 12/23/2017 1111      Component Value Date/Time   CALCIUM 9.3 10/10/2021 1104   ALKPHOS 87 10/10/2021 1104   AST 12 (L) 10/10/2021 1104   ALT 8 10/10/2021 1104   BILITOT 0.3 10/10/2021 1104       RADIOGRAPHIC STUDIES: CT Chest Wo Contrast  Result Date: 10/11/2021 CLINICAL DATA:  Primary Cancer Type: Lung Imaging Indication: Assess response to therapy Interval therapy since last imaging? Yes Initial Cancer Diagnosis Date: 07/15/2021; Established by: Biopsy-proven Detailed Pathology: Stage IIIA non-small cell lung cancer, adenocarcinoma. Primary Tumor location: Left lower lobe. Surgeries: Coronary graft 2021. Chemotherapy: Yes;  Ongoing? Yes; Most recent administration: 09/10/2021 Immunotherapy? No Radiation therapy? Yes; Date Range: 08/06/2021 - 09/18/2021; Target: Left lung EXAM: CT CHEST WITHOUT CONTRAST TECHNIQUE: Multidetector CT imaging of the chest was performed following the standard protocol without IV contrast. RADIATION DOSE REDUCTION: This exam was performed according to the departmental dose-optimization program which includes automated exposure control, adjustment of the mA and/or kV according to patient size and/or use of iterative reconstruction technique. COMPARISON:  Most recent CT chest 07/09/2021.  07/09/2021 PET-CT. FINDINGS: Cardiovascular: Coronary, aortic arch, and  branch vessel atherosclerotic vascular disease. Mediastinum/Nodes: Moderate to large hiatal hernia. Circumferential distal esophageal wall thickening is nonspecific although esophagitis would be a common cause. Continued abnormal soft tissue density fullness of the left hilum compatible with left hilar and infrahilar adenopathy, although difficult to measure separate from the vessels on today's noncontrast exam. Previously hypermetabolic left lower paratracheal lymph node 0.7 cm in short axis on image 50 series 2, previously similar. Lungs/Pleura: The left lower lobe nodule measures 2.5 by 2.1 by 1.9 cm (volume = 5.2 cm^3), and on 07/09/2021 this lesion measured 3.3 by 2.7 by 3.3 cm (volume = 15 cm^3), a 65% reduction in volume. Centrilobular emphysema. Stable scarring in the right middle lobe and right lower lobe. Upper Abdomen: Exophytic lesions of varying density of the left kidney including some hyperdense lesions, probably complex cysts although technically nonspecific on today's noncontrast examination. Nonobstructive left nephrolithiasis. Abdominal aortic atherosclerotic calcification. Musculoskeletal: Thoracic spondylosis. IMPRESSION: 1. 65% reduction in volume of the left lower lobe nodule. Persistent left hilar and infrahilar adenopathy. 2.   Aortic Atherosclerosis (ICD10-I70.0).  Coronary atherosclerosis. 3. Moderate to large hiatal hernia. 4. Circumferential distal esophageal wall thickening is nonspecific although esophagitis would be a common cause. 5.  Emphysema (ICD10-J43.9). 6. Exophytic lesions of varying density left kidney, including hyperdense lesions, probably complex cysts but technically nonspecific on today's noncontrast examination. Electronically Signed   By: Van Clines M.D.   On: 10/11/2021 11:06    ASSESSMENT AND PLAN: This is a very pleasant 86 years old white female with a stage IIIa (T2a, N2, M0) non-small cell lung cancer, adenocarcinoma presented with left lower lobe lung mass in addition to left hilar and mediastinal lymphadenopathy diagnosed in November 2022 with no actionable mutations The patient is currently undergoing a course of concurrent chemotherapy with radiation.  Her chemotherapy is in the form of carboplatin for AUC of 2 and paclitaxel 45 Mg/M2 status post 6 cycles.  She has been tolerating her treatment with concurrent chemoradiation fairly well except for the radiation-induced odynophagia and dysphagia. The patient is recovering well from her previous course of concurrent chemoradiation. She had repeat CT scan of the chest performed recently.  I personally and independently reviewed the scan images and discussed the result and showed the images to the patient today. Her scan showed significant improvement in her disease with 65% reduction in the volume of the left lower lobe nodule.  She continues to have persistent left hilar and infrahilar adenopathy. I discussed with the patient her next treatment options I recommended for her consolidation treatment with immunotherapy with Imfinzi 1500 Mg IV every 4 weeks.  She was also given the option of continuous observation and monitoring. The patient is interested in proceeding with the treatment and she is expected to start the first dose of this  treatment next week. I discussed with her the adverse effect of this treatment including but not limited to immunotherapy mediated skin rash, diarrhea, inflammation of the lung, kidney, liver, thyroid or other endocrine dysfunction. She will come back for follow-up visit in 5 weeks for evaluation before the next cycle of her treatment. The patient was advised to call immediately if she has any other concerning symptoms in the interval. The patient voices understanding of current disease status and treatment options and is in agreement with the current care plan.  All questions were answered. The patient knows to call the clinic with any problems, questions or concerns. We can certainly see the patient much sooner if necessary. The  total time spent in the appointment was 35 minutes.  Disclaimer: This note was dictated with voice recognition software. Similar sounding words can inadvertently be transcribed and may not be corrected upon review.

## 2021-10-16 NOTE — Progress Notes (Signed)
DISCONTINUE ON PATHWAY REGIMEN - Non-Small Cell Lung     Administer weekly:     Paclitaxel      Carboplatin   **Always confirm dose/schedule in your pharmacy ordering system**  REASON: Continuation Of Treatment PRIOR TREATMENT: [id number]: Carboplatin AUC=2 + Paclitaxel 45 mg/m2 Weekly During Radiation TREATMENT RESPONSE: Partial Response (PR)  START ON PATHWAY REGIMEN - Non-Small Cell Lung     A cycle is every 28 days:     Durvalumab   **Always confirm dose/schedule in your pharmacy ordering system**  Patient Characteristics: Preoperative or Nonsurgical Candidate (Clinical Staging), Stage III - Nonsurgical Candidate (Nonsquamous and Squamous), PS = 0, 1 Therapeutic Status: Preoperative or Nonsurgical Candidate (Clinical Staging) AJCC T Category: cT2a AJCC N Category: cN2 AJCC M Category: cM0 AJCC 8 Stage Grouping: IIIA ECOG Performance Status: 1 Intent of Therapy: Curative Intent, Discussed with Patient

## 2021-10-17 ENCOUNTER — Telehealth: Payer: Self-pay | Admitting: Internal Medicine

## 2021-10-17 NOTE — Telephone Encounter (Signed)
Scheduled appointment per 02/22 los. Patient aware.

## 2021-10-18 ENCOUNTER — Encounter: Payer: Self-pay | Admitting: Radiology

## 2021-10-18 ENCOUNTER — Telehealth: Payer: PPO

## 2021-10-23 ENCOUNTER — Other Ambulatory Visit: Payer: Self-pay | Admitting: Internal Medicine

## 2021-10-23 ENCOUNTER — Other Ambulatory Visit: Payer: Self-pay | Admitting: Medical Oncology

## 2021-10-23 ENCOUNTER — Inpatient Hospital Stay: Payer: PPO

## 2021-10-23 ENCOUNTER — Inpatient Hospital Stay: Payer: PPO | Attending: Internal Medicine

## 2021-10-23 ENCOUNTER — Other Ambulatory Visit: Payer: Self-pay

## 2021-10-23 VITALS — BP 134/61 | HR 76 | Temp 98.2°F | Resp 16 | Ht 66.0 in | Wt 165.8 lb

## 2021-10-23 DIAGNOSIS — D649 Anemia, unspecified: Secondary | ICD-10-CM

## 2021-10-23 DIAGNOSIS — J432 Centrilobular emphysema: Secondary | ICD-10-CM | POA: Insufficient documentation

## 2021-10-23 DIAGNOSIS — C3432 Malignant neoplasm of lower lobe, left bronchus or lung: Secondary | ICD-10-CM | POA: Diagnosis not present

## 2021-10-23 DIAGNOSIS — Z923 Personal history of irradiation: Secondary | ICD-10-CM | POA: Insufficient documentation

## 2021-10-23 DIAGNOSIS — Z5112 Encounter for antineoplastic immunotherapy: Secondary | ICD-10-CM | POA: Diagnosis not present

## 2021-10-23 DIAGNOSIS — Z79899 Other long term (current) drug therapy: Secondary | ICD-10-CM | POA: Diagnosis not present

## 2021-10-23 DIAGNOSIS — I700 Atherosclerosis of aorta: Secondary | ICD-10-CM | POA: Insufficient documentation

## 2021-10-23 DIAGNOSIS — C3490 Malignant neoplasm of unspecified part of unspecified bronchus or lung: Secondary | ICD-10-CM

## 2021-10-23 DIAGNOSIS — Z7982 Long term (current) use of aspirin: Secondary | ICD-10-CM | POA: Diagnosis not present

## 2021-10-23 LAB — CMP (CANCER CENTER ONLY)
ALT: 10 U/L (ref 0–44)
AST: 11 U/L — ABNORMAL LOW (ref 15–41)
Albumin: 3.8 g/dL (ref 3.5–5.0)
Alkaline Phosphatase: 81 U/L (ref 38–126)
Anion gap: 6 (ref 5–15)
BUN: 32 mg/dL — ABNORMAL HIGH (ref 8–23)
CO2: 25 mmol/L (ref 22–32)
Calcium: 8.9 mg/dL (ref 8.9–10.3)
Chloride: 106 mmol/L (ref 98–111)
Creatinine: 1.27 mg/dL — ABNORMAL HIGH (ref 0.44–1.00)
GFR, Estimated: 42 mL/min — ABNORMAL LOW (ref >60)
Glucose, Bld: 141 mg/dL — ABNORMAL HIGH (ref 70–99)
Potassium: 4.3 mmol/L (ref 3.5–5.1)
Sodium: 137 mmol/L (ref 135–145)
Total Bilirubin: 0.3 mg/dL (ref 0.3–1.2)
Total Protein: 6.7 g/dL (ref 6.5–8.1)

## 2021-10-23 LAB — SAMPLE TO BLOOD BANK

## 2021-10-23 LAB — CBC WITH DIFFERENTIAL (CANCER CENTER ONLY)
Abs Immature Granulocytes: 0.01 10*3/uL (ref 0.00–0.07)
Basophils Absolute: 0 10*3/uL (ref 0.0–0.1)
Basophils Relative: 1 %
Eosinophils Absolute: 0.2 10*3/uL (ref 0.0–0.5)
Eosinophils Relative: 3 %
HCT: 24.2 % — ABNORMAL LOW (ref 36.0–46.0)
Hemoglobin: 7.7 g/dL — ABNORMAL LOW (ref 12.0–15.0)
Immature Granulocytes: 0 %
Lymphocytes Relative: 5 %
Lymphs Abs: 0.3 10*3/uL — ABNORMAL LOW (ref 0.7–4.0)
MCH: 31.3 pg (ref 26.0–34.0)
MCHC: 31.8 g/dL (ref 30.0–36.0)
MCV: 98.4 fL (ref 80.0–100.0)
Monocytes Absolute: 0.6 10*3/uL (ref 0.1–1.0)
Monocytes Relative: 11 %
Neutro Abs: 4.1 10*3/uL (ref 1.7–7.7)
Neutrophils Relative %: 80 %
Platelet Count: 188 10*3/uL (ref 150–400)
RBC: 2.46 MIL/uL — ABNORMAL LOW (ref 3.87–5.11)
RDW: 19.2 % — ABNORMAL HIGH (ref 11.5–15.5)
WBC Count: 5.1 10*3/uL (ref 4.0–10.5)
nRBC: 0 % (ref 0.0–0.2)

## 2021-10-23 LAB — PREPARE RBC (CROSSMATCH)

## 2021-10-23 LAB — TSH: TSH: 15.867 u[IU]/mL — ABNORMAL HIGH (ref 0.308–3.960)

## 2021-10-23 LAB — ABO/RH: ABO/RH(D): B POS

## 2021-10-23 MED ORDER — SODIUM CHLORIDE 0.9 % IV SOLN
1500.0000 mg | Freq: Once | INTRAVENOUS | Status: AC
  Administered 2021-10-23: 1500 mg via INTRAVENOUS
  Filled 2021-10-23: qty 30

## 2021-10-23 MED ORDER — SODIUM CHLORIDE 0.9 % IV SOLN: Freq: Once | INTRAVENOUS | Status: AC

## 2021-10-23 NOTE — Progress Notes (Signed)
Per Dr Abigail [name] ok to treat today with Hgb of 7.7 today. Provider planning on blood transfusion. ?

## 2021-10-23 NOTE — Progress Notes (Incomplete)
°  Radiation Oncology         ([phone] ________________________________  Patient Name: Abigail [name] MRN: [id number] DOB: 16-Jul-1936 Referring Physician: Baltazar Apo (Profile Not Attached) Date of Service: 09/18/2021 [facility], [place]                                                        End Of Treatment Note  Diagnoses: C34.32-Malignant neoplasm of lower lobe, left bronchus or lung  Cancer Staging: Adenocarcinoma of the left lower lobe, clinical stage IIIA, (cT2a, cN2, Mx)   Intent: Curative  Radiation Treatment Dates: 08/05/2021 through 09/18/2021 Site Technique Total Dose (Gy) Dose per Fx (Gy) Completed Fx Beam Energies  Lung, Left: Lung_L 3D 60/60 2 30/30 6X, 10X   Narrative: The patient tolerated radiation therapy relatively well. During her final weekly treatment check on 09/16/21, the patient reported an increase in fatigue and weakness, odynophagia, fatigue, mild skin irritation on her back, ongoing SOB and cough, dysphagia. Overall, her main complaint was significant fatigue likely attributed to concurrent chemotherapy. Her last chemotherapy infusion was held last week due to low WBC.   The patient does live by herself and would like to see about having home health for a couple hours a day.  We have placed a referral for this order.    Plan: The patient will follow-up with radiation oncology in one month .  ________________________________________________ -----------------------------------  Blair Promise, PhD, MD  This document serves as a record of services personally performed by Gery Pray, MD. It was created on his behalf by Roney Mans, a trained medical scribe. The creation of this record is based on the scribe's personal observations and the provider's statements to them. This document has been checked and approved by the attending provider.

## 2021-10-23 NOTE — Patient Instructions (Signed)
[facility]  Discharge Instructions: Thank you for choosing [facility] to provide your oncology and hematology care.   If you have a lab appointment with the [facility], please go directly to the [facility] and check in at the registration area.   Wear comfortable clothing and clothing appropriate for easy access to any Portacath or PICC line.   We strive to give you quality time with your provider. You may need to reschedule your appointment if you arrive late (15 or more minutes).  Arriving late affects you and other patients whose appointments are after yours.  Also, if you miss three or more appointments without notifying the office, you may be dismissed from the clinic at the providers discretion.      For prescription refill requests, have your pharmacy contact our office and allow 72 hours for refills to be completed.    Today you received the following chemotherapy and/or immunotherapy agents Durvalumab (imfinzi)    To help prevent nausea and vomiting after your treatment, we encourage you to take your nausea medication as directed.  BELOW ARE SYMPTOMS THAT SHOULD BE REPORTED IMMEDIATELY: *FEVER GREATER THAN 100.4 F (38 C) OR HIGHER *CHILLS OR SWEATING *NAUSEA AND VOMITING THAT IS NOT CONTROLLED WITH YOUR NAUSEA MEDICATION *UNUSUAL SHORTNESS OF BREATH *UNUSUAL BRUISING OR BLEEDING *URINARY PROBLEMS (pain or burning when urinating, or frequent urination) *BOWEL PROBLEMS (unusual diarrhea, constipation, pain near the anus) TENDERNESS IN MOUTH AND THROAT WITH OR WITHOUT PRESENCE OF ULCERS (sore throat, sores in mouth, or a toothache) UNUSUAL RASH, SWELLING OR PAIN  UNUSUAL VAGINAL DISCHARGE OR ITCHING   Items with * indicate a potential emergency and should be followed up as soon as possible or go to the Emergency Department if any problems should occur.  Please show the CHEMOTHERAPY ALERT CARD or IMMUNOTHERAPY ALERT CARD at  check-in to the Emergency Department and triage nurse.  Should you have questions after your visit or need to cancel or reschedule your appointment, please contact [facility]  Dept: [phone]  and follow the prompts.  Office hours are 8:00 a.m. to 4:30 p.m. Monday - Friday. Please note that voicemails left after 4:00 p.m. may not be returned until the following business day.  We are closed weekends and major holidays. You have access to a nurse at all times for urgent questions. Please call the main number to the clinic Dept: [phone] and follow the prompts.   For any non-urgent questions, you may also contact your provider using MyChart. We now offer e-Visits for anyone 63 and older to request care online for non-urgent symptoms. For details visit [email or web address].   Also download the MyChart app! Go to the app store, search "MyChart", open the app, select [facility], and log in with your MyChart username and password.  Due to Covid, a mask is required upon entering the hospital/clinic. If you do not have a mask, one will be given to you upon arrival. For doctor visits, patients may have 1 support person aged 107 or older with them. For treatment visits, patients cannot have anyone with them due to current Covid guidelines and our immunocompromised population.   Durvalumab injection What is this medication? DURVALUMAB (dur VAL ue mab) is a monoclonal antibody. It is used to treat lung cancer. This medicine may be used for other purposes; ask your health care provider or pharmacist if you have questions. COMMON BRAND NAME(S): IMFINZI What should I tell my  care team before I take this medication? They need to know if you have any of these conditions: autoimmune diseases like Crohn's disease, ulcerative colitis, or lupus have had or planning to have an allogeneic stem cell transplant (uses someone else's stem cells) history of organ transplant history  of radiation to the chest nervous system problems like myasthenia gravis or Guillain-Barre syndrome an unusual or allergic reaction to durvalumab, other medicines, foods, dyes, or preservatives pregnant or trying to get pregnant breast-feeding How should I use this medication? This medicine is for infusion into a vein. It is given by a health care professional in a hospital or clinic setting. A special MedGuide will be given to you before each treatment. Be sure to read this information carefully each time. Talk to your pediatrician regarding the use of this medicine in children. Special care may be needed. Overdosage: If you think you have taken too much of this medicine contact a poison control center or emergency room at once. NOTE: This medicine is only for you. Do not share this medicine with others. What if I miss a dose? It is important not to miss your dose. Call your doctor or health care professional if you are unable to keep an appointment. What may interact with this medication? Interactions have not been studied. This list may not describe all possible interactions. Give your health care provider a list of all the medicines, herbs, non-prescription drugs, or dietary supplements you use. Also tell them if you smoke, drink alcohol, or use illegal drugs. Some items may interact with your medicine. What should I watch for while using this medication? This medication may make you feel generally unwell. Continue your course of treatment even though you feel ill unless your care team tells you to stop. You may need blood work done while you are taking this medication. Do not become pregnant while taking this medication or for 3 months after stopping it. Women should inform their care team if they wish to become pregnant or think they might be pregnant. There is a potential for serious side effects to an unborn child. Talk to your care team or pharmacist for more information. Do not  breast-feed an infant while taking this medication or for 3 months after stopping it. What side effects may I notice from receiving this medication? Side effects that you should report to your care team as soon as possible: Allergic reactions--skin rash, itching, hives, swelling of the face, lips, tongue, or throat Bloody or watery diarrhea Dizziness, loss of balance or coordination, confusion or trouble speaking Dry cough, shortness of breath or trouble breathing Flushing, mostly over the face, neck, and chest, during injection High blood sugar (hyperglycemia)--increased thirst or amount of urine, unusual weakness or fatigue, blurry vision High thyroid levels (hyperthyroidism)--fast or irregular heartbeat, weight loss, excessive sweating or sensitivity to heat, tremors or shaking, anxiety, nervousness, irregular menstrual cycle or spotting Infection--fever, chills, cough, or sore throat Liver injury--right upper belly pain, loss of appetite, nausea, light-colored stool, dark yellow or brown urine, yellowing skin or eyes, unusual weakness or fatigue Low adrenal gland function--nausea, vomiting, loss of appetite, unusual weakness or fatigue, dizziness, low blood pressure Low thyroid levels (hypothyroidism)--unusual weakness or fatigue, increased sensitivity to cold, constipation, hair loss, dry skin, weight gain, feelings of depression Pancreatitis--severe stomach pain that spreads to your back or gets worse after eating or when touched, fever, nausea, vomiting Rash, fever, and swollen lymph nodes Redness, blistering, peeling or loosening of the skin, including inside  the mouth Wheezing--trouble breathing with loud or whistling sounds Side effects that usually do not require medical attention (report these to your care team if they continue or are bothersome): Fatigue Hair loss This list may not describe all possible side effects. Call your doctor for medical advice about side effects. You may  report side effects to FDA at 1-800-FDA-1088. Where should I keep my medication? This medication is given in a hospital or clinic. It will not be stored at home. NOTE: This sheet is a summary. It may not cover all possible information. If you have questions about this medicine, talk to your doctor, pharmacist, or health care provider.  2022 Elsevier/Gold Standard (2021-04-30 00:00:00)

## 2021-10-23 NOTE — Progress Notes (Signed)
Radiation Oncology         [phone] ________________________________  Name: Abigail [name] MRN: [id number]  Date: 10/24/2021  DOB: May 18, 1936  Follow-Up Visit Note  CC: [name], FNP  Collene Gobble, MD    ICD-10-CM   1. Malignant neoplasm of unspecified part of unspecified bronchus or lung ([place])  C34.90     2. Pulmonary nodule 1 cm or greater in diameter  R91.1       Diagnosis: Adenocarcinoma of the left lower lobe, clinical stage IIIA, (cT2a, cN2, Mx)   Interval Since Last Radiation: 1 month and 5 days   Intent: Curative  Radiation Treatment Dates: 08/05/2021 through 09/18/2021 Site Technique Total Dose (Gy) Dose per Fx (Gy) Completed Fx Beam Energies  Lung, Left: Lung_L 3D 60/60 2 30/30 6X, 10X    Narrative:  The patient returns today for routine follow-up. The patient tolerated radiation therapy relatively well. During her final weekly treatment check on 09/16/21, the patient reported an increase in fatigue and weakness, odynophagia, fatigue, mild skin irritation on her back, ongoing SOB and cough, and dysphagia. Overall, her main complaint was significant fatigue likely attributed to concurrent chemotherapy. Of note: Her last chemotherapy infusion at the time of her final weekly treatment check was held the week prior due to low WBC.     Since her initial consultation date of 07/25/21, the patient has completed 6 cycles of carboplatin and paclitaxel (last dose on 09/03/21). During her most recent visit with Dr. Julien Nordmann on 10/16/21, the patient reported feeling well other than her baseline SOB and mild cough. She also reported improvement of swallowing issues. Further treatment options were discussed with the patient and the patient agreed to proceed with immunotherapy consisting of imfinzi; first dose on 10/23/20. (Of note: Dr. Julien Nordmann noted that the patient's daughter, who was present for their most recent visit over the phone, had an aggressive and off-putting approach  during their visit).   Pertinent imaging performed since the patient was last seen includes:  -- MRI of the brain w/ and w/o contrast on 07/28/21 showed no evidence of intracranial metastatic disease.  -- Chest CT on 10/10/21 demonstrated a 65% reduction in volume of the left lower lobe nodule, and persistent left hilar and infrahilar adenopathy. Other findings noted on CT included: a moderate to large hiatal hernia, nonspecific circumferential distal esophageal wall thickening; emphysema; and exophytic lesions of varying density left kidney, including hyperdense lesions (noted as probably complex cysts but technically nonspecific).   She reports her breathing is stable at this time.  Her esophageal issues have essentially cleared up at this time.  Allergies:  is allergic to codeine and norvasc [amlodipine].  Meds: Current Outpatient Medications  Medication Sig Dispense Refill   acetaminophen (TYLENOL) 325 MG tablet Take 2 tablets (650 mg total) by mouth every 6 (six) hours as needed for mild pain (or Fever >/= 101).     albuterol (VENTOLIN HFA) 108 (90 Base) MCG/ACT inhaler Inhale 2 puffs into the lungs every 6 (six) hours as needed for wheezing or shortness of breath. 8 g 0   aspirin EC 81 MG tablet Take 81 mg by mouth at bedtime.     atorvastatin (LIPITOR) 80 MG tablet Take 1 tablet (80 mg total) by mouth daily at 6 PM. 90 tablet 3   Cholecalciferol (VITAMIN D3 PO) Take 2 capsules by mouth daily.     Cranberry 1000 MG CAPS Take 1,000 mg by mouth daily at 12 noon.  cyclobenzaprine (FLEXERIL) 10 MG tablet Take 1 tablet (10 mg total) by mouth 3 (three) times daily as needed for muscle spasms. 30 tablet 1   docusate sodium (COLACE) 100 MG capsule Take 100-200 mg by mouth See admin instructions. Take 100 mg in the morning and 200 mg at bedtime     esomeprazole (NEXIUM) 20 MG capsule Take 1 capsule (20 mg total) by mouth 2 (two) times daily before a meal. 60 capsule 3   estradiol (ESTRACE) 0.1  MG/GM vaginal cream Place 0.5 g vaginally once a week. Place 0.5g twice a week at opening of vagina     ezetimibe (ZETIA) 10 MG tablet Take 1 tablet (10 mg total) by mouth daily. 90 tablet 3   ferrous sulfate 300 (60 Fe) MG/5ML syrup Take 5 mLs (300 mg total) by mouth 2 (two) times daily with a meal. 300 mL 11   levothyroxine (SYNTHROID) 175 MCG tablet TAKE 1 TABLET BY MOUTH EVERY MORNING ON MONDAY - FRIDAY 90 tablet 1   lisinopril-hydrochlorothiazide (ZESTORETIC) 20-25 MG tablet Take 0.5 tablets by mouth 3 (three) times a week. Take on Monday Wednesday and Friday only 90 tablet 1   prochlorperazine (COMPAZINE) 10 MG tablet Take 1 tablet (10 mg total) by mouth every 6 (six) hours as needed for nausea or vomiting. 30 tablet 2   Trospium Chloride 60 MG CP24 Take 1 capsule (60 mg total) by mouth daily. 30 capsule 11   nitroGLYCERIN (NITROSTAT) 0.4 MG SL tablet Place 1 tablet (0.4 mg total) under the tongue every 5 (five) minutes x 3 doses as needed for chest pain. (Patient not taking: Reported on 10/16/2021) 25 tablet 3   sucralfate (CARAFATE) 1 g tablet Take 1 tablet (1 g total) by mouth 4 (four) times daily -  with meals and at bedtime. Crush and dissolve in 10 mL of warm water, swallow prior to meals (Patient not taking: Reported on 10/24/2021) 120 tablet 1   No current facility-administered medications for this encounter.    Physical Findings: The patient is in no acute distress. Patient is alert and oriented.  height is 5\' 6"  (1.676 m) and weight is 165 lb 2 oz (74.9 kg). Her temporal temperature is 96.6 F (35.9 C) (abnormal). Her blood pressure is 119/51 (abnormal) and her pulse is 87. Her respiration is 20 and oxygen saturation is 94%. .  Lungs are clear to auscultation bilaterally. Heart has regular rate and rhythm. No palpable cervical, supraclavicular, or axillary adenopathy. Abdomen soft, non-tender, normal bowel sounds.    Lab Findings: Lab Results  Component Value Date   WBC 5.1  10/23/2021   HGB 7.7 (L) 10/23/2021   HCT 24.2 (L) 10/23/2021   MCV 98.4 10/23/2021   PLT 188 10/23/2021    Radiographic Findings: CT Chest Wo Contrast  Result Date: 10/11/2021 CLINICAL DATA:  Primary Cancer Type: Lung Imaging Indication: Assess response to therapy Interval therapy since last imaging? Yes Initial Cancer Diagnosis Date: 07/15/2021; Established by: Biopsy-proven Detailed Pathology: Stage IIIA non-small cell lung cancer, adenocarcinoma. Primary Tumor location: Left lower lobe. Surgeries: Coronary graft 2021. Chemotherapy: Yes; Ongoing? Yes; Most recent administration: 09/10/2021 Immunotherapy? No Radiation therapy? Yes; Date Range: 08/06/2021 - 09/18/2021; Target: Left lung EXAM: CT CHEST WITHOUT CONTRAST TECHNIQUE: Multidetector CT imaging of the chest was performed following the standard protocol without IV contrast. RADIATION DOSE REDUCTION: This exam was performed according to the departmental dose-optimization program which includes automated exposure control, adjustment of the mA and/or kV according to patient size and/or  use of iterative reconstruction technique. COMPARISON:  Most recent CT chest 07/09/2021.  07/09/2021 PET-CT. FINDINGS: Cardiovascular: Coronary, aortic arch, and branch vessel atherosclerotic vascular disease. Mediastinum/Nodes: Moderate to large hiatal hernia. Circumferential distal esophageal wall thickening is nonspecific although esophagitis would be a common cause. Continued abnormal soft tissue density fullness of the left hilum compatible with left hilar and infrahilar adenopathy, although difficult to measure separate from the vessels on today's noncontrast exam. Previously hypermetabolic left lower paratracheal lymph node 0.7 cm in short axis on image 50 series 2, previously similar. Lungs/Pleura: The left lower lobe nodule measures 2.5 by 2.1 by 1.9 cm (volume = 5.2 cm^3), and on 07/09/2021 this lesion measured 3.3 by 2.7 by 3.3 cm (volume = 15 cm^3), a 65%  reduction in volume. Centrilobular emphysema. Stable scarring in the right middle lobe and right lower lobe. Upper Abdomen: Exophytic lesions of varying density of the left kidney including some hyperdense lesions, probably complex cysts although technically nonspecific on today's noncontrast examination. Nonobstructive left nephrolithiasis. Abdominal aortic atherosclerotic calcification. Musculoskeletal: Thoracic spondylosis. IMPRESSION: 1. 65% reduction in volume of the left lower lobe nodule. Persistent left hilar and infrahilar adenopathy. 2.  Aortic Atherosclerosis (ICD10-I70.0).  Coronary atherosclerosis. 3. Moderate to large hiatal hernia. 4. Circumferential distal esophageal wall thickening is nonspecific although esophagitis would be a common cause. 5.  Emphysema (ICD10-J43.9). 6. Exophytic lesions of varying density left kidney, including hyperdense lesions, probably complex cysts but technically nonspecific on today's noncontrast examination. Electronically Signed   By: Van Clines M.D.   On: 10/11/2021 11:06    Impression:  Adenocarcinoma of the left lower lobe, clinical stage IIIA, (cT2a, cN2, Mx)   The patient is recovering from the effects of radiation.  Recent chest CT scan shows favorable response.  Plan: As needed follow-up in radiation oncology.  The patient will continue close follow-up with medical oncology and continue on immunotherapy.   ____________________________________  Blair Promise, PhD, MD   This document serves as a record of services personally performed by Gery Pray, MD. It was created on his behalf by Roney Mans, a trained medical scribe. The creation of this record is based on the scribe's personal observations and the provider's statements to them. This document has been checked and approved by the attending provider.

## 2021-10-24 ENCOUNTER — Ambulatory Visit
Admission: RE | Admit: 2021-10-24 | Discharge: 2021-10-24 | Disposition: A | Discharge status: Home / Self Care | Payer: PPO | Source: Ambulatory Visit | Attending: Radiation Oncology | Admitting: Radiation Oncology

## 2021-10-24 ENCOUNTER — Encounter: Payer: Self-pay | Admitting: Radiation Oncology

## 2021-10-24 ENCOUNTER — Inpatient Hospital Stay: Payer: PPO

## 2021-10-24 ENCOUNTER — Inpatient Hospital Stay: Payer: PPO | Admitting: Emergency Medicine

## 2021-10-24 ENCOUNTER — Telehealth: Payer: Self-pay

## 2021-10-24 VITALS — BP 119/51 | HR 87 | Temp 96.6°F | Resp 20 | Ht 66.0 in | Wt 165.1 lb

## 2021-10-24 DIAGNOSIS — C3490 Malignant neoplasm of unspecified part of unspecified bronchus or lung: Secondary | ICD-10-CM

## 2021-10-24 DIAGNOSIS — R911 Solitary pulmonary nodule: Secondary | ICD-10-CM

## 2021-10-24 DIAGNOSIS — Z79899 Other long term (current) drug therapy: Secondary | ICD-10-CM | POA: Insufficient documentation

## 2021-10-24 DIAGNOSIS — Z923 Personal history of irradiation: Secondary | ICD-10-CM | POA: Insufficient documentation

## 2021-10-24 DIAGNOSIS — Z7982 Long term (current) use of aspirin: Secondary | ICD-10-CM | POA: Insufficient documentation

## 2021-10-24 DIAGNOSIS — Z5112 Encounter for antineoplastic immunotherapy: Secondary | ICD-10-CM | POA: Diagnosis not present

## 2021-10-24 DIAGNOSIS — C3432 Malignant neoplasm of lower lobe, left bronchus or lung: Secondary | ICD-10-CM | POA: Insufficient documentation

## 2021-10-24 DIAGNOSIS — J432 Centrilobular emphysema: Secondary | ICD-10-CM | POA: Insufficient documentation

## 2021-10-24 DIAGNOSIS — I700 Atherosclerosis of aorta: Secondary | ICD-10-CM | POA: Insufficient documentation

## 2021-10-24 DIAGNOSIS — D649 Anemia, unspecified: Secondary | ICD-10-CM

## 2021-10-24 MED ORDER — ACETAMINOPHEN 325 MG PO TABS
650.0000 mg | ORAL_TABLET | Freq: Once | ORAL | Status: AC
  Administered 2021-10-24: 650 mg via ORAL
  Filled 2021-10-24: qty 2

## 2021-10-24 MED ORDER — DIPHENHYDRAMINE HCL 25 MG PO CAPS
25.0000 mg | ORAL_CAPSULE | Freq: Once | ORAL | Status: AC
  Administered 2021-10-24: 25 mg via ORAL
  Filled 2021-10-24: qty 1

## 2021-10-24 MED ORDER — SODIUM CHLORIDE 0.9% IV SOLUTION
250.0000 mL | Freq: Once | INTRAVENOUS | Status: AC
  Administered 2021-10-24: 250 mL via INTRAVENOUS

## 2021-10-24 NOTE — Patient Instructions (Signed)

## 2021-10-24 NOTE — Progress Notes (Signed)
Abigail [name] is here today for follow up post radiation to the lung. ? ?Lung Side: left, completed treatment on 09/18/21 ? ?Does the patient complain of any of the following: ?Pain:yes, patient reports pain to left back. ?Shortness of breath w/wo exertion: yes on exertion. ?Cough: yes, productive cough. ?Hemoptysis: no ?Pain with swallowing: no ?Swallowing/choking concerns: no ?Appetite: good ?Energy Level: low ?Post radiation skin Changes: no ? ? ? ?Additional comments if applicable:  ?Vitals:  ? 10/24/21 0842  ?BP: (!) 119/51  ?Pulse: 87  ?Resp: 20  ?Temp: (!) 96.6 ?F (35.9 ?C)  ?TempSrc: Temporal  ?SpO2: 94%  ?Weight: 165 lb 2 oz (74.9 kg)  ?Height: 5\' 6"  (1.676 m)  ?  ?

## 2021-10-24 NOTE — Telephone Encounter (Signed)
-----   Message from Tildon Husky, RN sent at 10/23/2021 11:33 AM EST ----- ?Regarding: first time treatment call back -Abigail [name] ?Patient received imfinzi for the first time today. She is followed by Dr. Julien Nordmann. Patient tolerated treatment well.  ? ?

## 2021-10-24 NOTE — Research (Signed)
A Randomized pragmatic Chair-Based Home Exercise Intervention for Mitigating Cancer-Related Fatigue in Older Adults Undergoing Chemotherapy for Advanced Disease ? ?10/24/21 ? ?Met with patient while she was in infusion today.  Because the patient is receiving a blood transfusion today, will reschedule week 12 research visit. ? ?Will call the patient on Monday to complete week 11 coaching call and schedule the week 12 visit.  Patient agreed with this plan. ? ?She denied questions at this time. ? ?[name] ?Clinical Research Coordinator I  ?10/24/21 10:27 AM ?

## 2021-10-24 NOTE — Telephone Encounter (Signed)
LM for patient to call back to Dr. Worthy Flank nurse at [phone] if she were having any issues or concerns from her first treatment yesterday. ?

## 2021-10-25 LAB — BPAM RBC
Blood Product Expiration Date: 202303192359
Blood Product Expiration Date: 202303202359
ISSUE DATE / TIME: 202303021002
ISSUE DATE / TIME: 202303021002
Unit Type and Rh: 7300
Unit Type and Rh: 7300

## 2021-10-25 LAB — TYPE AND SCREEN
ABO/RH(D): B POS
Antibody Screen: NEGATIVE
Unit division: 0
Unit division: 0

## 2021-10-28 ENCOUNTER — Telehealth: Payer: Self-pay | Admitting: Emergency Medicine

## 2021-10-28 NOTE — Telephone Encounter (Signed)
A Randomized pragmatic Chair-Based Home Exercise Intervention for Mitigating Cancer-Related Fatigue in Older Adults Undergoing Chemotherapy for Advanced Disease ? ?10/28/21 - Week 11 Call ? ?Called patient to complete week 11 coaching call.  Patient states she is feeling better since her blood transfusion on Friday.  She states she has been able to exercise some this weekend with a stationary bicycle. She also states she has been more active doing housework and spending some time outside.  She has not performed the exercises associated with this study. ? ?Encouraged patient to start the chair based home exercise program slowly and gradually increase.  Patient states she plans to start these exercises this week. ? ?Scheduled week 12 assessments to take place on 3/22 at 9:45am.  Patient requests reminder call that Monday, 3/20. ? ?Patient denied questions at this time.   ? ?[name] ?Clinical Research Coordinator I  ?10/28/21 11:31 AM ? ?

## 2021-10-30 ENCOUNTER — Telehealth: Payer: PPO

## 2021-10-30 ENCOUNTER — Ambulatory Visit (INDEPENDENT_AMBULATORY_CARE_PROVIDER_SITE_OTHER): Payer: PPO

## 2021-10-30 DIAGNOSIS — R918 Other nonspecific abnormal finding of lung field: Secondary | ICD-10-CM

## 2021-10-30 DIAGNOSIS — R7303 Prediabetes: Secondary | ICD-10-CM

## 2021-10-30 DIAGNOSIS — E039 Hypothyroidism, unspecified: Secondary | ICD-10-CM

## 2021-10-30 DIAGNOSIS — I10 Essential (primary) hypertension: Secondary | ICD-10-CM

## 2021-10-30 DIAGNOSIS — E785 Hyperlipidemia, unspecified: Secondary | ICD-10-CM

## 2021-10-30 DIAGNOSIS — D649 Anemia, unspecified: Secondary | ICD-10-CM

## 2021-10-30 DIAGNOSIS — F419 Anxiety disorder, unspecified: Secondary | ICD-10-CM

## 2021-10-31 NOTE — Patient Instructions (Signed)
Visit Information ? ?Thank you for taking time to visit with me today. Please don't hesitate to contact me if I can be of assistance to you before our next scheduled telephone appointment. ? ?Following are the goals we discussed today:  ?(Copy and paste patient goals from clinical care plan here) ? ?Our next appointment is by telephone on 11/28/21 at 12:45 PM  ? ?Please call the care guide team at [phone] if you need to cancel or reschedule your appointment.  ? ?If you are experiencing a Mental Health or [place] or need someone to talk to, please call 1-800-273-TALK (toll free, 24 hour hotline)  ? ?The patient verbalized understanding of instructions, educational materials, and care plan provided today and agreed to receive a mailed copy of patient instructions, educational materials, and care plan.  ? ?Barb Merino, RN, BSN, CCM ?Care Management Coordinator ?[facility]  ?Direct Phone: [phone] ? ? ?

## 2021-10-31 NOTE — Chronic Care Management (AMB) (Cosign Needed)
Chronic Care Management   CCM RN Visit Note  10/30/2021 Name: Abigail [name] MRN: [id number] DOB: 1935/12/18  Subjective: Abigail [name] is a 86 y.o. year old female who is a primary care patient of [name], [place]. The care management team was consulted for assistance with disease management and care coordination needs.    Engaged with patient by telephone for follow up visit in response to provider referral for case management and/or care coordination services.   Consent to Services:  The patient was given information about Chronic Care Management services, agreed to services, and gave verbal consent prior to initiation of services.  Please see initial visit note for detailed documentation.   Patient agreed to services and verbal consent obtained.   Assessment: Review of patient past medical history, allergies, medications, health status, including review of consultants reports, laboratory and other test data, was performed as part of comprehensive evaluation and provision of chronic care management services.   SDOH (Social Determinants of Health) assessments and interventions performed:  Yes, no acute challenges  CCM Care Plan  Allergies  Allergen Reactions   Codeine Nausea And Vomiting   Norvasc [Amlodipine] Rash and Other (See Comments)    rash    Outpatient Encounter Medications as of 10/30/2021  Medication Sig   acetaminophen (TYLENOL) 325 MG tablet Take 2 tablets (650 mg total) by mouth every 6 (six) hours as needed for mild pain (or Fever >/= 101).   albuterol (VENTOLIN HFA) 108 (90 Base) MCG/ACT inhaler Inhale 2 puffs into the lungs every 6 (six) hours as needed for wheezing or shortness of breath.   aspirin EC 81 MG tablet Take 81 mg by mouth at bedtime.   atorvastatin (LIPITOR) 80 MG tablet Take 1 tablet (80 mg total) by mouth daily at 6 PM.   Cholecalciferol (VITAMIN D3 PO) Take 2 capsules by mouth daily.   Cranberry 1000 MG CAPS Take 1,000 mg by mouth daily at 12 noon.    cyclobenzaprine (FLEXERIL) 10 MG tablet Take 1 tablet (10 mg total) by mouth 3 (three) times daily as needed for muscle spasms.   docusate sodium (COLACE) 100 MG capsule Take 100-200 mg by mouth See admin instructions. Take 100 mg in the morning and 200 mg at bedtime   esomeprazole (NEXIUM) 20 MG capsule Take 1 capsule (20 mg total) by mouth 2 (two) times daily before a meal.   estradiol (ESTRACE) 0.1 MG/GM vaginal cream Place 0.5 g vaginally once a week. Place 0.5g twice a week at opening of vagina   ezetimibe (ZETIA) 10 MG tablet Take 1 tablet (10 mg total) by mouth daily.   ferrous sulfate 300 (60 Fe) MG/5ML syrup Take 5 mLs (300 mg total) by mouth 2 (two) times daily with a meal.   levothyroxine (SYNTHROID) 175 MCG tablet TAKE 1 TABLET BY MOUTH EVERY MORNING ON MONDAY - FRIDAY   lisinopril-hydrochlorothiazide (ZESTORETIC) 20-25 MG tablet Take 0.5 tablets by mouth 3 (three) times a week. Take on Monday Wednesday and Friday only   nitroGLYCERIN (NITROSTAT) 0.4 MG SL tablet Place 1 tablet (0.4 mg total) under the tongue every 5 (five) minutes x 3 doses as needed for chest pain. (Patient not taking: Reported on 10/16/2021)   prochlorperazine (COMPAZINE) 10 MG tablet Take 1 tablet (10 mg total) by mouth every 6 (six) hours as needed for nausea or vomiting.   sucralfate (CARAFATE) 1 g tablet Take 1 tablet (1 g total) by mouth 4 (four) times daily -  with meals and  at bedtime. Crush and dissolve in 10 mL of warm water, swallow prior to meals (Patient not taking: Reported on 10/24/2021)   Trospium Chloride 60 MG CP24 Take 1 capsule (60 mg total) by mouth daily.   No facility-administered encounter medications on file as of 10/30/2021.    Patient Active Problem List   Diagnosis Date Noted   COPD (chronic obstructive pulmonary disease) ([place]) 08/20/2021   Dysphagia 08/20/2021   History of lung cancer 07/31/2021   Malignant neoplasm of unspecified part of unspecified bronchus or lung ([place]) 07/25/2021    Encounter for antineoplastic chemotherapy 07/25/2021   Mediastinal adenopathy 07/15/2021   Pulmonary nodule 1 cm or greater in diameter 07/02/2021   Coronary artery disease 04/11/2021   Right bundle branch block 04/11/2021   Newly recognized heart murmur 04/11/2021   Occult blood positive stool 11/09/2020   Chest pain with moderate risk for cardiac etiology 12/01/2019   Urinary urgency 12/01/2019   Anxiety 02/09/2019   Prediabetes 12/29/2018   Essential hypertension 08/27/2018   Vitamin D deficiency 06/25/2018   Hyperlipidemia 06/25/2018   Hypothyroidism 06/25/2018   Benign hypertension with CKD (chronic kidney disease) stage III ([place]) 06/25/2018   Chronic kidney disease, stage III (moderate) ([place]) 06/25/2018   OAB (overactive bladder) 08/26/2016   CAP (community acquired pneumonia) 04/12/2012   Bacteremia 04/08/2012   LLQ abdominal pain 04/05/2012   Hyponatremia 04/05/2012    Conditions to be addressed/monitored: Acquired hypothyroidism, Anxiety, Essential hypertension, Prediabetes, Hyperlipidemia, Anemia, Lung Mass  Care Plan : RN Care Manager Plan of Care  Updates made by Lynne Logan, RN since 10/30/2021 12:00 AM     Problem: No plan of care established for management of chronic disease states (Acquired hypothyroidism, Anxiety, Essential hypertension, Prediabetes, Hyperlipidemia, Anemia, Lung Mass)   Priority: High     Long-Range Goal: Establishment of plan of care for management of chronic disease states (Acquired hypothyroidism, Anxiety, Essential hypertension, Prediabetes, Hyperlipidemia, Anemia, Lung Mass)   Start Date: 10/30/2021  Expected End Date: 10/31/2022  This Visit's Progress: On track  Priority: High  Note:   Current Barriers:  Knowledge Deficits related to plan of care for management of Acquired hypothyroidism, Anxiety, Essential hypertension, Prediabetes, Hyperlipidemia, Anemia, Lung Mass  Care Coordination needs related to Acquired hypothyroidism, Anxiety,  Essential hypertension, Prediabetes, Hyperlipidemia, Anemia, Lung Mass  RNCM Clinical Goal(s):  Patient will verbalize basic understanding of  Acquired hypothyroidism, Anxiety, Essential hypertension, Prediabetes, Hyperlipidemia, Anemia, Lung Mass disease process and self health management plan as evidenced by patient will report having no disease exacerbations related to her chronic disease states as listed above  take all medications exactly as prescribed and will call provider for medication related questions as evidenced by patient will demonstrate improved understanding of prescribed medications and rationale for usage as evidenced by patient teach back demonstrate Improved health management independence as evidenced by patient will report 100% adherence to her prescribed treatment plan  continue to work with RN Care Manager to address care management and care coordination needs related to  Acquired hypothyroidism, Anxiety, Essential hypertension, Prediabetes, Hyperlipidemia, Anemia, Lung Mass as evidenced by adherence to CM Team Scheduled appointments demonstrate ongoing self health care management ability  as evidenced by  through collaboration with RN Care manager, provider, and care team.   Interventions: 1:1 collaboration with primary care provider regarding development and update of comprehensive plan of care as evidenced by provider attestation and co-signature Inter-disciplinary care team collaboration (see longitudinal plan of care) Evaluation of current treatment plan related to  self management and patient's adherence to plan as established by provider  Oncology:  (Status: Goal on track:  Yes.) Long Term Goal Assessment of understanding of oncology diagnosis: Malignant neoplasm of unspecified part of unspecified bronchus or lung Assessed patient understanding of cancer diagnosis and recommended treatment plan, Reviewed upcoming provider appointments and treatment appointments,  Assessed available transportation to appointments and treatments. Has consistent/reliable transportation: Yes, Assessed support system. Has consistent/reliable family or other support: Yes, and Nutrition assessment performed Educated patient about access to the embedded BSW for SODH needs, patient declines at this time    Anemia/Bleeding Interventions:  (Status:  Goal on track:  Yes.) Long Term Goal  Assessment of understanding of anemia disorder diagnosis  Basic overview and discussion of anemia disorder or acute disease state  Medications reviewed  Review of patient status, including review of consultant's reports, relevant laboratory and other test results, and medications completed Lab Results  Component Value Date   WBC 5.1 10/23/2021   HGB 7.7 (L) 10/23/2021   HCT 24.2 (L) 10/23/2021   MCV 98.4 10/23/2021   PLT 188 10/23/2021     Depression Interventions:  (Status:  Goal on track:  Yes.)  Long Term Goal Evaluation of current treatment plan related to Depression,  self-management and patient's adherence to plan as established by provider Determined patient feels she is coping with her new Cancer diagnosis at this time, she admits the initial diagnosis and treatment was very difficult Educated on benefits of using meditation, deep breathing and routine exercise to help with effective coping Determined patient's son Nicole Kindred is residing with her and supportive, however he has limited availability due to the demands of his job Educated patient regarding access to the embedded BSW and encouraged patient to notify this RN CM if a referral for SDOH needs arise Instructed patient to contact her PCP and or Oncology team for persistent or worsening symptoms of depression  Mailed printed educational materials related Chair Exercises Discussed plans with patient for ongoing care management follow up and provided patient with direct contact information for care management team   Patient  Goals/Self-Care Activities: Take all medications as prescribed Attend all scheduled provider appointments Call pharmacy for medication refills 3-7 days in advance of running out of medications Perform all self care activities independently  Call provider office for new concerns or questions   Follow Up Plan:  Telephone follow up appointment with care management team member scheduled for:  11/28/21      Barb Merino, RN, BSN, CCM Care Management Coordinator [facility]  Direct Phone: [phone]

## 2021-11-12 ENCOUNTER — Telehealth: Payer: Self-pay | Admitting: Emergency Medicine

## 2021-11-12 NOTE — Telephone Encounter (Signed)
A Randomized pragmatic Chair-Based Home Exercise Intervention for Mitigating Cancer-Related Fatigue in Older Adults Undergoing Chemotherapy for Advanced Disease ? ?11/12/21 ? ?1:45pm: Called to remind patient of research appointment scheduled for 9:45am tomorrow morning.  Patient did not answer, left voicemail. ? ?[name] ?Clinical Research Coordinator I  ?11/12/21 1:49 PM  ?

## 2021-11-13 ENCOUNTER — Other Ambulatory Visit: Payer: Self-pay

## 2021-11-13 ENCOUNTER — Inpatient Hospital Stay: Payer: PPO | Admitting: Emergency Medicine

## 2021-11-13 DIAGNOSIS — C3490 Malignant neoplasm of unspecified part of unspecified bronchus or lung: Secondary | ICD-10-CM

## 2021-11-13 NOTE — Research (Signed)
A Randomized pragmatic Chair-Based Home Exercise Intervention for Mitigating Cancer-Related Fatigue in Older Adults Undergoing Chemotherapy for Advanced Disease ? ?11/13/21 - Week 12 visit ? ?Patient presents for scheduled week 12 research visit.  Patient is unaccompanied and is using a wheelchair for mobility while in the clinic. ? ?PROs:  ?Patient completed required PROs (PROMIS-F, IADL, [place] History, Efficacy for Exercise Scale, CES-D, and Participant Feedback) while in the clinic today.  These were checked for completeness by this research coordinator. ? ?Home Exercise Log:  ?Collected home exercise log for weeks 6-13 from the patient today. ? ?SPPB:  ?Completed SPPB per protocol.  Patient held side-by-side stand and semi-tandem stand each for 10 seconds.  Patient was unable to complete tandem stand.  Patient did not complete the gait speed test due to feeling unsafe.  Patient states she normally uses walker or other assistance to walk, she is using a wheelchair in the clinic today.  Patient was able to complete a single chair stand without help.  Patient completed repeated 5 chair stands in 25.24 seconds. ? ?Gift Card:  ?The patient was given two $25 gift cards for total of $50 for participation in today's research assessments. ? ?This visit completes this patient's participation in this research study.  She was thanked for her time and participation in this study.  The patient denies further questions at this time.   ? ? ?[name] ?Clinical Research Coordinator I  ?11/13/21 10:45 AM ?

## 2021-11-20 ENCOUNTER — Inpatient Hospital Stay: Payer: PPO

## 2021-11-20 ENCOUNTER — Other Ambulatory Visit: Payer: Self-pay

## 2021-11-20 ENCOUNTER — Inpatient Hospital Stay: Payer: PPO | Admitting: Internal Medicine

## 2021-11-20 VITALS — BP 130/67 | HR 81 | Temp 97.9°F | Resp 16 | Ht 66.0 in | Wt 161.6 lb

## 2021-11-20 DIAGNOSIS — C3490 Malignant neoplasm of unspecified part of unspecified bronchus or lung: Secondary | ICD-10-CM

## 2021-11-20 DIAGNOSIS — Z5112 Encounter for antineoplastic immunotherapy: Secondary | ICD-10-CM | POA: Insufficient documentation

## 2021-11-20 LAB — CMP (CANCER CENTER ONLY)
ALT: 9 U/L (ref 0–44)
AST: 12 U/L — ABNORMAL LOW (ref 15–41)
Albumin: 4 g/dL (ref 3.5–5.0)
Alkaline Phosphatase: 81 U/L (ref 38–126)
Anion gap: 10 (ref 5–15)
BUN: 40 mg/dL — ABNORMAL HIGH (ref 8–23)
CO2: 24 mmol/L (ref 22–32)
Calcium: 9.2 mg/dL (ref 8.9–10.3)
Chloride: 104 mmol/L (ref 98–111)
Creatinine: 1.44 mg/dL — ABNORMAL HIGH (ref 0.44–1.00)
GFR, Estimated: 36 mL/min — ABNORMAL LOW (ref >60)
Glucose, Bld: 129 mg/dL — ABNORMAL HIGH (ref 70–99)
Potassium: 3.8 mmol/L (ref 3.5–5.1)
Sodium: 138 mmol/L (ref 135–145)
Total Bilirubin: 0.3 mg/dL (ref 0.3–1.2)
Total Protein: 7.5 g/dL (ref 6.5–8.1)

## 2021-11-20 LAB — CBC WITH DIFFERENTIAL (CANCER CENTER ONLY)
Abs Immature Granulocytes: 0.02 10*3/uL (ref 0.00–0.07)
Basophils Absolute: 0 10*3/uL (ref 0.0–0.1)
Basophils Relative: 1 %
Eosinophils Absolute: 0.1 10*3/uL (ref 0.0–0.5)
Eosinophils Relative: 3 %
HCT: 31.9 % — ABNORMAL LOW (ref 36.0–46.0)
Hemoglobin: 9.9 g/dL — ABNORMAL LOW (ref 12.0–15.0)
Immature Granulocytes: 0 %
Lymphocytes Relative: 5 %
Lymphs Abs: 0.2 10*3/uL — ABNORMAL LOW (ref 0.7–4.0)
MCH: 31 pg (ref 26.0–34.0)
MCHC: 31 g/dL (ref 30.0–36.0)
MCV: 100 fL (ref 80.0–100.0)
Monocytes Absolute: 0.6 10*3/uL (ref 0.1–1.0)
Monocytes Relative: 11 %
Neutro Abs: 4.3 10*3/uL (ref 1.7–7.7)
Neutrophils Relative %: 80 %
Platelet Count: 219 10*3/uL (ref 150–400)
RBC: 3.19 MIL/uL — ABNORMAL LOW (ref 3.87–5.11)
RDW: 15.4 % (ref 11.5–15.5)
WBC Count: 5.3 10*3/uL (ref 4.0–10.5)
nRBC: 0 % (ref 0.0–0.2)

## 2021-11-20 LAB — TSH: TSH: 12.065 u[IU]/mL — ABNORMAL HIGH (ref 0.308–3.960)

## 2021-11-20 MED ORDER — SODIUM CHLORIDE 0.9 % IV SOLN
1500.0000 mg | Freq: Once | INTRAVENOUS | Status: AC
  Administered 2021-11-20: 1500 mg via INTRAVENOUS
  Filled 2021-11-20: qty 30

## 2021-11-20 MED ORDER — SODIUM CHLORIDE 0.9 % IV SOLN: Freq: Once | INTRAVENOUS | Status: AC

## 2021-11-20 NOTE — Patient Instructions (Signed)
[facility]   Discharge Instructions: Thank you for choosing [facility] to provide your oncology and hematology care.   If you have a lab appointment with the Cancer Center, please go directly to the Cancer Center and check in at the registration area.   Wear comfortable clothing and clothing appropriate for easy access to any Portacath or PICC line.   We strive to give you quality time with your provider. You may need to reschedule your appointment if you arrive late (15 or more minutes).  Arriving late affects you and other patients whose appointments are after yours.  Also, if you miss three or more appointments without notifying the office, you may be dismissed from the clinic at the provider's discretion.      For prescription refill requests, have your pharmacy contact our office and allow 72 hours for refills to be completed.    Today you received the following chemotherapy and/or immunotherapy agents: Durvalumab (Imfinzi).      To help prevent nausea and vomiting after your treatment, we encourage you to take your nausea medication as directed.  BELOW ARE SYMPTOMS THAT SHOULD BE REPORTED IMMEDIATELY: *FEVER GREATER THAN 100.4 F (38 C) OR HIGHER *CHILLS OR SWEATING *NAUSEA AND VOMITING THAT IS NOT CONTROLLED WITH YOUR NAUSEA MEDICATION *UNUSUAL SHORTNESS OF BREATH *UNUSUAL BRUISING OR BLEEDING *URINARY PROBLEMS (pain or burning when urinating, or frequent urination) *BOWEL PROBLEMS (unusual diarrhea, constipation, pain near the anus) TENDERNESS IN MOUTH AND THROAT WITH OR WITHOUT PRESENCE OF ULCERS (sore throat, sores in mouth, or a toothache) UNUSUAL RASH, SWELLING OR PAIN  UNUSUAL VAGINAL DISCHARGE OR ITCHING   Items with * indicate a potential emergency and should be followed up as soon as possible or go to the Emergency Department if any problems should occur.  Please show the CHEMOTHERAPY ALERT CARD or IMMUNOTHERAPY ALERT CARD  at check-in to the Emergency Department and triage nurse.  Should you have questions after your visit or need to cancel or reschedule your appointment, please contact [facility]  Dept: [phone]  and follow the prompts.  Office hours are 8:00 a.m. to 4:30 p.m. Monday - Friday. Please note that voicemails left after 4:00 p.m. may not be returned until the following business day.  We are closed weekends and major holidays. You have access to a nurse at all times for urgent questions. Please call the main number to the clinic Dept: [phone] and follow the prompts.   For any non-urgent questions, you may also contact your provider using MyChart. We now offer e-Visits for anyone 18 and older to request care online for non-urgent symptoms. For details visit [email or web address].   Also download the MyChart app! Go to the app store, search "MyChart", open the app, select [facility], and log in with your MyChart username and password.  Due to Covid, a mask is required upon entering the hospital/clinic. If you do not have a mask, one will be given to you upon arrival. For doctor visits, patients may have 1 support person aged 18 or older with them. For treatment visits, patients cannot have anyone with them due to current Covid guidelines and our immunocompromised population.   

## 2021-11-20 NOTE — Progress Notes (Signed)
?    [facility] ?[phone]   [phone] ? ?OFFICE PROGRESS NOTE ? ?[name], [place] ?[address] ?[place] [address] ? ?DIAGNOSIS: Stage IIIA (T2a, N2, M0) non-small cell lung cancer, adenocarcinoma presented with left lower lobe lung mass in addition to left hilar and mediastinal lymphadenopathy diagnosed in November 2022 with no actionable mutation on the guardant blood test ?  ?PRIOR THERAPY: Weekly concurrent chemoradiation with carboplatin for an AUC of 2 and paclitaxel 45 mg per metered squared.  First dose on 08/05/2021.  Status post 6 cycles.  Last dose was given on September 03, 2021. ?  ?CURRENT THERAPY: Consolidation treatment with immunotherapy with Imfinzi 1500 Mg IV every 4 weeks.  First dose October 23, 2021.  Status post 1 cycle. ? ?INTERVAL HISTORY: ?Abigail [name] 86 y.o. female returns to the clinic today for follow-up visit.  The patient is feeling fine today with no concerning complaints except for mild fatigue and residual odynophagia.  She lost few pounds since her last visit but this is more intentionally after she did cut on eating a lot of ice creams.  She denied having any current chest pain but has shortness of breath with exertion with no cough or hemoptysis.  She has no nausea, vomiting, diarrhea or constipation.  She has no headache or visual changes.  She tolerated the first cycle of her treatment with immunotherapy fairly well.  She is here for evaluation before starting cycle #2. ? ? ?MEDICAL HISTORY: ?Past Medical History:  ?Diagnosis Date  ? Anemia   ? Arthritis   ? knees, back  ? Carotid artery occlusion   ? Chronic kidney disease   ? stage 3 ckd no nephrologist  ? Complete uterine prolapse with prolapse of anterior vaginal wall   ? Complication of anesthesia   ? hard to wake up per pt  ? Constipation   ? Coronary artery disease   ? Diverticulitis yrs ago coialitis  ? Dyspnea   ? History of blood transfusion   ? History of  radiation therapy   ? right lung 08/05/2021-09/18/2021  Dr Gery Pray  ? Hypertension   ? Hypothyroid   ? Numbness   ? in hands at times  ? Pneumonia   ? Pre-diabetes   ? Scoliosis   ? STEMI (ST elevation myocardial infarction) ([place]) 10/26/2019  ? DES RCA  ? Wears dentures   ? full dentures  ? Wears glasses   ? for reading  ? ? ?ALLERGIES:  is allergic to codeine and norvasc [amlodipine]. ? ?MEDICATIONS:  ?Current Outpatient Medications  ?Medication Sig Dispense Refill  ? acetaminophen (TYLENOL) 325 MG tablet Take 2 tablets (650 mg total) by mouth every 6 (six) hours as needed for mild pain (or Fever >/= 101).    ? albuterol (VENTOLIN HFA) 108 (90 Base) MCG/ACT inhaler Inhale 2 puffs into the lungs every 6 (six) hours as needed for wheezing or shortness of breath. 8 g 0  ? aspirin EC 81 MG tablet Take 81 mg by mouth at bedtime.    ? atorvastatin (LIPITOR) 80 MG tablet Take 1 tablet (80 mg total) by mouth daily at 6 PM. 90 tablet 3  ? Cholecalciferol (VITAMIN D3 PO) Take 2 capsules by mouth daily.    ? Cranberry 1000 MG CAPS Take 1,000 mg by mouth daily at 12 noon.    ? cyclobenzaprine (FLEXERIL) 10 MG tablet Take 1 tablet (10 mg total) by mouth 3 (three) times  daily as needed for muscle spasms. 30 tablet 1  ? docusate sodium (COLACE) 100 MG capsule Take 100-200 mg by mouth See admin instructions. Take 100 mg in the morning and 200 mg at bedtime    ? esomeprazole (NEXIUM) 20 MG capsule Take 1 capsule (20 mg total) by mouth 2 (two) times daily before a meal. 60 capsule 3  ? estradiol (ESTRACE) 0.1 MG/GM vaginal cream Place 0.5 g vaginally once a week. Place 0.5g twice a week at opening of vagina    ? ezetimibe (ZETIA) 10 MG tablet Take 1 tablet (10 mg total) by mouth daily. 90 tablet 3  ? ferrous sulfate 300 (60 Fe) MG/5ML syrup Take 5 mLs (300 mg total) by mouth 2 (two) times daily with a meal. 300 mL 11  ? levothyroxine (SYNTHROID) 175 MCG tablet TAKE 1 TABLET BY MOUTH EVERY MORNING ON MONDAY - FRIDAY 90 tablet 1   ? lisinopril-hydrochlorothiazide (ZESTORETIC) 20-25 MG tablet Take 0.5 tablets by mouth 3 (three) times a week. Take on Monday Wednesday and Friday only 90 tablet 1  ? nitroGLYCERIN (NITROSTAT) 0.4 MG SL tablet Place 1 tablet (0.4 mg total) under the tongue every 5 (five) minutes x 3 doses as needed for chest pain. (Patient not taking: Reported on 10/16/2021) 25 tablet 3  ? prochlorperazine (COMPAZINE) 10 MG tablet Take 1 tablet (10 mg total) by mouth every 6 (six) hours as needed for nausea or vomiting. 30 tablet 2  ? sucralfate (CARAFATE) 1 g tablet Take 1 tablet (1 g total) by mouth 4 (four) times daily -  with meals and at bedtime. Crush and dissolve in 10 mL of warm water, swallow prior to meals (Patient not taking: Reported on 10/24/2021) 120 tablet 1  ? Trospium Chloride 60 MG CP24 Take 1 capsule (60 mg total) by mouth daily. 30 capsule 11  ? ?No current facility-administered medications for this visit.  ? ? ?SURGICAL HISTORY:  ?Past Surgical History:  ?Procedure Laterality Date  ? ANTERIOR AND POSTERIOR REPAIR WITH SACROSPINOUS FIXATION N/A 12/20/2020  ? Procedure: SACROSPINOUS LIGAMENT FIXATION;  Surgeon: [name], MD;  Location: [facility];  Service: Gynecology;  Laterality: N/A;  Total time requested for all procedures is 2 hours  ? BACK SURGERY  1980 age 47  ? lower  ? bartholin cyst removal  age 51's  ? BLADDER SUSPENSION N/A 12/20/2020  ? Procedure: TRANSVAGINAL TAPE (TVT) PROCEDURE;  Surgeon: [name], MD;  Location: [facility];  Service: Gynecology;  Laterality: N/A;  ? BRONCHIAL BRUSHINGS  07/15/2021  ? Procedure: BRONCHIAL BRUSHINGS;  Surgeon: Collene Gobble, MD;  Location: [facility] ENDOSCOPY;  Service: Pulmonary;;  ? BRONCHIAL NEEDLE ASPIRATION BIOPSY  07/15/2021  ? Procedure: BRONCHIAL NEEDLE ASPIRATION BIOPSIES;  Surgeon: Collene Gobble, MD;  Location: [facility] ENDOSCOPY;  Service: Pulmonary;;  ? CORONARY/GRAFT ACUTE MI REVASCULARIZATION N/A  10/26/2019  ? Procedure: Coronary/Graft Acute MI Revascularization;  Surgeon: Sherren Mocha, MD;  Location: [place] [facility] CV LAB;  Service: Cardiovascular;  Laterality: N/A;  ? CYSTOCELE REPAIR N/A 12/20/2020  ? Procedure: ANTERIOR AND POSTERIOR REPAIR WITH PERINEORRHAPHY;  Surgeon: [name], MD;  Location[facility];  Service: Gynecology;  Laterality: N/A;  ? CYSTOSCOPY N/A 12/20/2020  ? Procedure: CYSTOSCOPY;  Surgeon: [name], MD;  Location[facility];  Service: Gynecology;  Laterality: N/A;  ? ELBOW SURGERY  1990's  ? left  ? ENDOBRONCHIAL ULTRASOUND N/A 07/15/2021  ? Procedure: ENDOBRONCHIAL ULTRASOUND;  Surgeon: Collene Gobble, MD;  Location[facility] END[facility]COPY;  Service: Pulmonary;  Laterality: N/A;  ? HEMOSTASIS CONTROL  07/15/2021  ? Procedure: HEMOSTASIS CONTROL;  Surgeon: Collene Gobble, MD;  Location: [facility] ENDOSCOPY;  Service: Pulmonary;;  ? LEFT HEART CATH AND CORONARY ANGIOGRAPHY N/A 10/26/2019  ? Procedure: LEFT HEART CATH AND CORONARY ANGIOGRAPHY;  Surgeon: Sherren Mocha, MD;  Location: [place] [facility];  Service: Cardiovascular;  Laterality: N/A;  ? TONSILLECTOMY    ? ? ?REVIEW OF SYSTEMS:  A comprehensive review of systems was negative except for: Constitutional: positive for fatigue ?Respiratory: positive for dyspnea on exertion ?Musculoskeletal: positive for muscle weakness  ? ?PHYSICAL EXAMINATION: General appearance: alert, cooperative, fatigued, and no distress ?Head: Normocephalic, without obvious abnormality, atraumatic ?Neck: no adenopathy, no JVD, supple, symmetrical, trachea midline, and thyroid not enlarged, symmetric, no tenderness/mass/nodules ?Lymph nodes: Cervical, supraclavicular, and axillary nodes normal. ?Resp: clear to auscultation bilaterally ?Back: symmetric, no curvature. ROM normal. No CVA tenderness. ?Cardio: regular rate and rhythm, S1, S2 normal, no murmur, click, rub or gallop ?GI: soft, non-tender; bowel sounds  normal; no masses,  no organomegaly ?Extremities: extremities normal, atraumatic, no cyanosis or edema ? ?ECOG PERFORMANCE STATUS: 1 - Symptomatic but completely ambulatory ? ?Blood pressure 130/67, pulse 81, temper

## 2021-11-22 DIAGNOSIS — C3490 Malignant neoplasm of unspecified part of unspecified bronchus or lung: Secondary | ICD-10-CM | POA: Diagnosis not present

## 2021-11-22 DIAGNOSIS — F32A Depression, unspecified: Secondary | ICD-10-CM | POA: Diagnosis not present

## 2021-11-22 DIAGNOSIS — D630 Anemia in neoplastic disease: Secondary | ICD-10-CM

## 2021-11-26 ENCOUNTER — Ambulatory Visit (INDEPENDENT_AMBULATORY_CARE_PROVIDER_SITE_OTHER): Payer: PPO | Admitting: Nurse Practitioner

## 2021-11-26 ENCOUNTER — Encounter: Payer: Self-pay | Admitting: Nurse Practitioner

## 2021-11-26 VITALS — BP 122/60 | HR 80 | Temp 97.5°F | Ht 66.0 in | Wt 159.0 lb

## 2021-11-26 DIAGNOSIS — F419 Anxiety disorder, unspecified: Secondary | ICD-10-CM | POA: Diagnosis not present

## 2021-11-26 DIAGNOSIS — I129 Hypertensive chronic kidney disease with stage 1 through stage 4 chronic kidney disease, or unspecified chronic kidney disease: Secondary | ICD-10-CM | POA: Diagnosis not present

## 2021-11-26 DIAGNOSIS — R7303 Prediabetes: Secondary | ICD-10-CM | POA: Diagnosis not present

## 2021-11-26 DIAGNOSIS — J449 Chronic obstructive pulmonary disease, unspecified: Secondary | ICD-10-CM | POA: Diagnosis not present

## 2021-11-26 DIAGNOSIS — C3490 Malignant neoplasm of unspecified part of unspecified bronchus or lung: Secondary | ICD-10-CM | POA: Diagnosis not present

## 2021-11-26 DIAGNOSIS — E785 Hyperlipidemia, unspecified: Secondary | ICD-10-CM | POA: Diagnosis not present

## 2021-11-26 DIAGNOSIS — E039 Hypothyroidism, unspecified: Secondary | ICD-10-CM

## 2021-11-26 DIAGNOSIS — Z23 Encounter for immunization: Secondary | ICD-10-CM

## 2021-11-26 DIAGNOSIS — L659 Nonscarring hair loss, unspecified: Secondary | ICD-10-CM | POA: Diagnosis not present

## 2021-11-26 DIAGNOSIS — N183 Chronic kidney disease, stage 3 unspecified: Secondary | ICD-10-CM | POA: Diagnosis not present

## 2021-11-26 MED ORDER — LEVOTHYROXINE SODIUM 175 MCG PO TABS: ORAL_TABLET | ORAL | 1 refills | Status: DC

## 2021-11-26 MED ORDER — HYDROXYZINE HCL 10 MG PO TABS: 10.0000 mg | ORAL_TABLET | Freq: Three times a day (TID) | ORAL | 0 refills | Status: DC | PRN

## 2021-11-26 NOTE — Progress Notes (Signed)
?Industrial/product designer as a Education administrator for [employer], FNP.,have documented all relevant documentation on the behalf of [name], FNP,as directed by  [name], FNP while in the presence of [name], [place]. ? ?This visit occurred during the SARS-CoV-2 public health emergency.  Safety protocols were in place, including screening questions prior to the visit, additional usage of staff PPE, and extensive cleaning of exam room while observing appropriate contact time as indicated for disinfecting solutions. ? ?Subjective:  ?  ? Patient ID: Abigail [name] , female    DOB: 09-02-1935 , 86 y.o.   MRN: [id number] ? ? ?Chief Complaint  ?Patient presents with  ? Hypertension  ? ? ?HPI ? ?Patient presents today for a bp and thyroid. She has been receiving infusions for her lung cancer. She reports she is now losing her hair while on immunotherapy.  She has received chemo and radiation.  She feels like her nerves are bad but does not want to gain any weight (she has taken wellbutrin in the past and felt like she gained weight). Her son has moved in with her and her grandson has also moved into the home - by Monday morning he was tearing everything up in her backyard. She feels like there is some mental health issues and the grandson is no longer living there. She feels like this is some of her stress.  ? ?She had her TSH Checked 6 days ago was 12 down from 15.  ? ?Hypertension ?This is a chronic problem. The current episode started more than 1 year ago. The problem is unchanged. The problem is controlled. Pertinent negatives include no anxiety, chest pain, headaches or palpitations. Risk factors for coronary artery disease include sedentary lifestyle and stress. Past treatments include diuretics and ACE inhibitors. Compliance problems include exercise.  Hypertensive end-organ damage includes kidney disease. There is no history of angina. Identifiable causes of hypertension include chronic renal disease and a thyroid  problem.  ?Thyroid Problem ?Presents for follow-up visit. Patient reports no cold intolerance, constipation, fatigue, palpitations, tremors or weight loss. The symptoms have been stable.   ? ?Past Medical History:  ?Diagnosis Date  ? Anemia   ? Arthritis   ? knees, back  ? Carotid artery occlusion   ? Chronic kidney disease   ? stage 3 ckd no nephrologist  ? Complete uterine prolapse with prolapse of anterior vaginal wall   ? Complication of anesthesia   ? hard to wake up per pt  ? Constipation   ? Coronary artery disease   ? Diverticulitis yrs ago coialitis  ? Dyspnea   ? History of blood transfusion   ? History of radiation therapy   ? right lung 08/05/2021-09/18/2021  Dr Gery Pray  ? Hypertension   ? Hypothyroid   ? Numbness   ? in hands at times  ? Pneumonia   ? Pre-diabetes   ? Scoliosis   ? STEMI (ST elevation myocardial infarction) ([place]) 10/26/2019  ? DES RCA  ? Wears dentures   ? full dentures  ? Wears glasses   ? for reading  ?  ? ?Family History  ?Problem Relation Age of Onset  ? Hypertension Mother   ? ? ? ?Current Outpatient Medications:  ?  acetaminophen (TYLENOL) 325 MG tablet, Take 2 tablets (650 mg total) by mouth every 6 (six) hours as needed for mild pain (or Fever >/= 101)., Disp: , Rfl:  ?  albuterol (VENTOLIN HFA) 108 (90 Base) MCG/ACT inhaler, Inhale 2 puffs into  the lungs every 6 (six) hours as needed for wheezing or shortness of breath., Disp: 8 g, Rfl: 0 ?  aspirin EC 81 MG tablet, Take 81 mg by mouth at bedtime., Disp: , Rfl:  ?  atorvastatin (LIPITOR) 80 MG tablet, Take 1 tablet (80 mg total) by mouth daily at 6 PM., Disp: 90 tablet, Rfl: 3 ?  Cholecalciferol (VITAMIN D3 PO), Take 2 capsules by mouth daily., Disp: , Rfl:  ?  Cranberry 1000 MG CAPS, Take 1,000 mg by mouth daily at 12 noon., Disp: , Rfl:  ?  cyclobenzaprine (FLEXERIL) 10 MG tablet, Take 1 tablet (10 mg total) by mouth 3 (three) times daily as needed for muscle spasms., Disp: 30 tablet, Rfl: 1 ?  docusate sodium (COLACE)  100 MG capsule, Take 100-200 mg by mouth See admin instructions. Take 100 mg in the morning and 200 mg at bedtime, Disp: , Rfl:  ?  esomeprazole (NEXIUM) 20 MG capsule, Take 1 capsule (20 mg total) by mouth 2 (two) times daily before a meal., Disp: 60 capsule, Rfl: 3 ?  estradiol (ESTRACE) 0.1 MG/GM vaginal cream, Place 0.5 g vaginally once a week. Place 0.5g twice a week at opening of vagina, Disp: , Rfl:  ?  ezetimibe (ZETIA) 10 MG tablet, Take 1 tablet (10 mg total) by mouth daily., Disp: 90 tablet, Rfl: 3 ?  ferrous sulfate 300 (60 Fe) MG/5ML syrup, Take 5 mLs (300 mg total) by mouth 2 (two) times daily with a meal., Disp: 300 mL, Rfl: 11 ?  hydrOXYzine (ATARAX) 10 MG tablet, Take 1 tablet (10 mg total) by mouth 3 (three) times daily as needed., Disp: 30 tablet, Rfl: 0 ?  lisinopril-hydrochlorothiazide (ZESTORETIC) 20-25 MG tablet, Take 0.5 tablets by mouth 3 (three) times a week. Take on Monday Wednesday and Friday only, Disp: 90 tablet, Rfl: 1 ?  nitroGLYCERIN (NITROSTAT) 0.4 MG SL tablet, Place 1 tablet (0.4 mg total) under the tongue every 5 (five) minutes x 3 doses as needed for chest pain., Disp: 25 tablet, Rfl: 3 ?  prochlorperazine (COMPAZINE) 10 MG tablet, Take 1 tablet (10 mg total) by mouth every 6 (six) hours as needed for nausea or vomiting., Disp: 30 tablet, Rfl: 2 ?  sucralfate (CARAFATE) 1 g tablet, Take 1 tablet (1 g total) by mouth 4 (four) times daily -  with meals and at bedtime. Crush and dissolve in 10 mL of warm water, swallow prior to meals, Disp: 120 tablet, Rfl: 1 ?  Trospium Chloride 60 MG CP24, Take 1 capsule (60 mg total) by mouth daily., Disp: 30 capsule, Rfl: 11 ?  levothyroxine (SYNTHROID) 175 MCG tablet, TAKE 1 TABLET BY MOUTH EVERY MORNING ON MONDAY - FRIDAY, Disp: 90 tablet, Rfl: 1  ? ?Allergies  ?Allergen Reactions  ? Codeine Nausea And Vomiting  ? Norvasc [Amlodipine] Rash and Other (See Comments)  ?  rash  ?  ? ?Review of Systems  ?Constitutional: Negative.  Negative for  fatigue and weight loss.  ?Respiratory: Negative.  Negative for cough.   ?Cardiovascular: Negative.  Negative for chest pain, palpitations and leg swelling.  ?Gastrointestinal:  Negative for constipation.  ?Endocrine: Negative for cold intolerance.  ?Skin:   ?     Hair loss ?  ?Neurological:  Negative for dizziness, tremors and headaches.  ?Psychiatric/Behavioral: Negative.     ? ?Today's Vitals  ? 11/26/21 0919  ?BP: 122/60  ?Pulse: 80  ?Temp: (!) 97.5 ?F (36.4 ?C)  ?TempSrc: Oral  ?Weight: 159 lb (72.1  kg)  ?Height: 5\' 6"  (1.676 m)  ? ?Body mass index is 25.66 kg/m?.  ?Wt Readings from Last 3 Encounters:  ?11/26/21 159 lb (72.1 kg)  ?11/20/21 161 lb 9 oz (73.3 kg)  ?10/24/21 165 lb 2 oz (74.9 kg)  ? ? ?Objective:  ?Physical Exam ?Vitals reviewed.  ?Constitutional:   ?   General: She is not in acute distress. ?   Appearance: Normal appearance.  ?Cardiovascular:  ?   Rate and Rhythm: Normal rate and regular rhythm.  ?   Pulses: Normal pulses.  ?   Heart sounds: Normal heart sounds. No murmur heard. ?Pulmonary:  ?   Effort: Pulmonary effort is normal. No respiratory distress.  ?   Breath sounds: Normal breath sounds. No wheezing.  ?Neurological:  ?   General: No focal deficit present.  ?   Mental Status: She is alert and oriented to person, place, and time.  ?   Cranial Nerves: No cranial nerve deficit.  ?   Motor: No weakness.  ?Psychiatric:     ?   Mood and Affect: Mood normal.     ?   Behavior: Behavior normal.     ?   Thought Content: Thought content normal.     ?   Judgment: Judgment normal.  ?  ? ?   ?Assessment And Plan:  ?   ?1. Benign hypertension with CKD (chronic kidney disease) stage III (HCC) ?Comments: Blood pressure is well controllec, continue current medications.  ? ?2. Acquired hypothyroidism ?Comments: Was slightly elevated at last visit will recheck levels and make changes to medications accordingly ?- T3, free ?- T4 ? ?3. Hyperlipidemia, unspecified hyperlipidemia type ?Comments: Improving at  last visit continue statin, tolerating well.  ? ?4. Prediabetes ?Comments: Stable, no current medications.  ? ?5. Anxiety ?- hydrOXYzine (ATARAX) 10 MG tablet; Take 1 tablet (10 mg total) by mouth 3 (three) times

## 2021-11-26 NOTE — Patient Instructions (Signed)

## 2021-11-27 LAB — T4: T4, Total: 6.9 ug/dL (ref 4.5–12.0)

## 2021-11-27 LAB — T3, FREE: T3, Free: 2.4 pg/mL (ref 2.0–4.4)

## 2021-11-28 ENCOUNTER — Telehealth: Payer: PPO

## 2021-11-28 ENCOUNTER — Telehealth: Payer: Self-pay

## 2021-11-28 NOTE — Telephone Encounter (Signed)
?  Care Management  ? ?Follow Up Note ? ? ?11/28/2021 ?Name: Abigail [name] MRN: [id number] DOB: March 08, 1936 ? ? ?Referred by: [name], FNP ?Reason for referral : Chronic Care Management (RN CM Follow up call ) ? ? ?An unsuccessful telephone outreach was attempted today. The patient was referred to the case management team for assistance with care management and care coordination.  ? ?Follow Up Plan: Telephone follow up appointment with care management team member scheduled for: 12/17/21 ? ?Barb Merino, RN, BSN, CCM ?Care Management Coordinator ?[facility] [facility]  ?Direct Phone: [phone] ? ? ?

## 2021-12-07 ENCOUNTER — Other Ambulatory Visit: Payer: Self-pay | Admitting: Nurse Practitioner

## 2021-12-07 DIAGNOSIS — E039 Hypothyroidism, unspecified: Secondary | ICD-10-CM

## 2021-12-14 NOTE — Progress Notes (Signed)
[facility] ?OFFICE PROGRESS NOTE ? ?[name], [name] ?[address] ?[place] [address] ? ?DIAGNOSIS: Stage IIIA (T2a, N2, M0) non-small cell lung cancer, adenocarcinoma presented with left lower lobe lung mass in addition to left hilar and mediastinal lymphadenopathy diagnosed in November 2022 with no actionable mutation on the guardant blood test ? ?PRIOR THERAPY:  Weekly concurrent chemoradiation with carboplatin for an AUC of 2 and paclitaxel 45 mg per metered squared.  First dose on 08/05/2021.  Status post 6 cycles.  Last dose was given on September 03, 2021. ? ?CURRENT THERAPY:  Consolidation treatment with immunotherapy with Imfinzi 1500 Mg IV every 4 weeks.  First dose October 23, 2021.  Status post 2 cycles. ? ?INTERVAL HISTORY: ?Abigail [name] 86 y.o. female returns to the clinic today for a follow-up visit. She is currently undergoing consolidation immunotherapy with Imfinzi. She is status post 2 cycles. She tolerates this well without any concerning adverse side effects except she reported nausea following her last treatment which is unusual. She has not had anymore N/V in about 3 weeks. She recently saw her PCP for hypertension management, anxiety, and hypothyroidism medication adjustment. She has some fatigue. She is recovering from her odynophagia/dysphagia.  She still reports a poor appetite but gained about 5lbs since last being seen. She denies any fever or night sweats.  She denies any chest pain or hemoptysis.  She reports dyspnea with certain activities which is stable. She reports some phlegm/cough which is also similar to her baseline. She has delsym and mucinex at home but does not like taking medications. She sometimes has alternating diarrhea/constipation but none recently. She denies headaches or visual changes. She denies any rashes or skin changes except she reports she has more skin tags/skin plaques on her chest. She is here for evaluation and repeat blood  work before starting cycle #3.  ? ? ? ? ? ?MEDICAL HISTORY: ?Past Medical History:  ?Diagnosis Date  ? Anemia   ? Arthritis   ? knees, back  ? Carotid artery occlusion   ? Chronic kidney disease   ? stage 3 ckd no nephrologist  ? Complete uterine prolapse with prolapse of anterior vaginal wall   ? Complication of anesthesia   ? hard to wake up per pt  ? Constipation   ? Coronary artery disease   ? Diverticulitis yrs ago coialitis  ? Dyspnea   ? History of blood transfusion   ? History of radiation therapy   ? right lung 08/05/2021-09/18/2021  Dr Gery Pray  ? Hypertension   ? Hypothyroid   ? Numbness   ? in hands at times  ? Pneumonia   ? Pre-diabetes   ? Scoliosis   ? STEMI (ST elevation myocardial infarction) ([place]) 10/26/2019  ? DES RCA  ? Wears dentures   ? full dentures  ? Wears glasses   ? for reading  ? ? ?ALLERGIES:  is allergic to codeine and norvasc [amlodipine]. ? ?MEDICATIONS:  ?Current Outpatient Medications  ?Medication Sig Dispense Refill  ? acetaminophen (TYLENOL) 325 MG tablet Take 2 tablets (650 mg total) by mouth every 6 (six) hours as needed for mild pain (or Fever >/= 101).    ? albuterol (VENTOLIN HFA) 108 (90 Base) MCG/ACT inhaler Inhale 2 puffs into the lungs every 6 (six) hours as needed for wheezing or shortness of breath. 8 g 0  ? aspirin EC 81 MG tablet Take 81 mg by mouth at bedtime.    ?  atorvastatin (LIPITOR) 80 MG tablet Take 1 tablet (80 mg total) by mouth daily at 6 PM. 90 tablet 3  ? Cholecalciferol (VITAMIN D3 PO) Take 2 capsules by mouth daily.    ? Cranberry 1000 MG CAPS Take 1,000 mg by mouth daily at 12 noon.    ? cyclobenzaprine (FLEXERIL) 10 MG tablet Take 1 tablet (10 mg total) by mouth 3 (three) times daily as needed for muscle spasms. 30 tablet 1  ? docusate sodium (COLACE) 100 MG capsule Take 100-200 mg by mouth See admin instructions. Take 100 mg in the morning and 200 mg at bedtime    ? esomeprazole (NEXIUM) 20 MG capsule Take 1 capsule (20 mg total) by mouth 2 (two)  times daily before a meal. 60 capsule 3  ? estradiol (ESTRACE) 0.1 MG/GM vaginal cream Place 0.5 g vaginally once a week. Place 0.5g twice a week at opening of vagina    ? ezetimibe (ZETIA) 10 MG tablet Take 1 tablet (10 mg total) by mouth daily. 90 tablet 3  ? ferrous sulfate 300 (60 Fe) MG/5ML syrup Take 5 mLs (300 mg total) by mouth 2 (two) times daily with a meal. 300 mL 11  ? hydrOXYzine (ATARAX) 10 MG tablet Take 1 tablet (10 mg total) by mouth 3 (three) times daily as needed. 30 tablet 0  ? levothyroxine (SYNTHROID) 175 MCG tablet TAKE 1 TABLET BY MOUTH EVERY MORNING ON MONDAY - FRIDAY 90 tablet 1  ? lisinopril-hydrochlorothiazide (ZESTORETIC) 20-25 MG tablet Take 0.5 tablets by mouth 3 (three) times a week. Take on Monday Wednesday and Friday only 90 tablet 1  ? nitroGLYCERIN (NITROSTAT) 0.4 MG SL tablet Place 1 tablet (0.4 mg total) under the tongue every 5 (five) minutes x 3 doses as needed for chest pain. 25 tablet 3  ? prochlorperazine (COMPAZINE) 10 MG tablet Take 1 tablet (10 mg total) by mouth every 6 (six) hours as needed for nausea or vomiting. 30 tablet 2  ? sucralfate (CARAFATE) 1 g tablet Take 1 tablet (1 g total) by mouth 4 (four) times daily -  with meals and at bedtime. Crush and dissolve in 10 mL of warm water, swallow prior to meals 120 tablet 1  ? Trospium Chloride 60 MG CP24 Take 1 capsule (60 mg total) by mouth daily. 30 capsule 11  ? ?No current facility-administered medications for this visit.  ? ? ?SURGICAL HISTORY:  ?Past Surgical History:  ?Procedure Laterality Date  ? ANTERIOR AND POSTERIOR REPAIR WITH SACROSPINOUS FIXATION N/A 12/20/2020  ? Procedure: SACROSPINOUS LIGAMENT FIXATION;  Surgeon: [name], MD;  Location: [facility];  Service: Gynecology;  Laterality: N/A;  Total time requested for all procedures is 2 hours  ? BACK SURGERY  1980 age 46  ? lower  ? bartholin cyst removal  age 73's  ? BLADDER SUSPENSION N/A 12/20/2020  ? Procedure:  TRANSVAGINAL TAPE (TVT) PROCEDURE;  Surgeon: [name], MD;  Location: [facility];  Service: Gynecology;  Laterality: N/A;  ? BRONCHIAL BRUSHINGS  07/15/2021  ? Procedure: BRONCHIAL BRUSHINGS;  Surgeon: Collene Gobble, MD;  Location: [facility] ENDOSCOPY;  Service: Pulmonary;;  ? BRONCHIAL NEEDLE ASPIRATION BIOPSY  07/15/2021  ? Procedure: BRONCHIAL NEEDLE ASPIRATION BIOPSIES;  Surgeon: Collene Gobble, MD;  Location: [facility] ENDOSCOPY;  Service: Pulmonary;;  ? CORONARY/GRAFT ACUTE MI REVASCULARIZATION N/A 10/26/2019  ? Procedure: Coronary/Graft Acute MI Revascularization;  Surgeon: Sherren Mocha, MD;  Location: [facility];  Service: Cardiovascular;  Laterality: N/A;  ?  CYSTOCELE REPAIR N/A 12/20/2020  ? Procedure: ANTERIOR AND POSTERIOR REPAIR WITH PERINEORRHAPHY;  Surgeon: [name], MD;  Location: [facility];  Service: Gynecology;  Laterality: N/A;  ? CYSTOSCOPY N/A 12/20/2020  ? Procedure: CYSTOSCOPY;  Surgeon: [name], MD;  Location: [facility];  Service: Gynecology;  Laterality: N/A;  ? ELBOW SURGERY  1990's  ? left  ? ENDOBRONCHIAL ULTRASOUND N/A 07/15/2021  ? Procedure: ENDOBRONCHIAL ULTRASOUND;  Surgeon: Collene Gobble, MD;  Location: [facility] ENDOSCOPY;  Service: Pulmonary;  Laterality: N/A;  ? HEMOSTASIS CONTROL  07/15/2021  ? Procedure: HEMOSTASIS CONTROL;  Surgeon: Collene Gobble, MD;  Location: [facility] ENDOSCOPY;  Service: Pulmonary;;  ? LEFT HEART CATH AND CORONARY ANGIOGRAPHY N/A 10/26/2019  ? Procedure: LEFT HEART CATH AND CORONARY ANGIOGRAPHY;  Surgeon: Sherren Mocha, MD;  Location: [facility];  Service: Cardiovascular;  Laterality: N/A;  ? TONSILLECTOMY    ? ? ?REVIEW OF SYSTEMS:   ?Review of Systems  ?Constitutional: Positive for fatigue and diminished appetite. Negative for  chills, fever and unexpected weight change.  ?HENT: Negative for mouth sores, nosebleeds, sore throat and trouble swallowing.   ?Eyes:  Negative for eye problems and icterus.  ?Respiratory: Positive for chronic cough and dyspnea on exertion. Negative for  hemoptysis and wheezing.   ?Cardiovascular: Negative for chest pain and leg swelling.  ?[employer]

## 2021-12-17 ENCOUNTER — Telehealth: Payer: Self-pay

## 2021-12-17 ENCOUNTER — Telehealth: Payer: PPO

## 2021-12-17 ENCOUNTER — Ambulatory Visit (INDEPENDENT_AMBULATORY_CARE_PROVIDER_SITE_OTHER): Payer: PPO

## 2021-12-17 DIAGNOSIS — E039 Hypothyroidism, unspecified: Secondary | ICD-10-CM

## 2021-12-17 DIAGNOSIS — R7303 Prediabetes: Secondary | ICD-10-CM

## 2021-12-17 DIAGNOSIS — R918 Other nonspecific abnormal finding of lung field: Secondary | ICD-10-CM

## 2021-12-17 DIAGNOSIS — F419 Anxiety disorder, unspecified: Secondary | ICD-10-CM

## 2021-12-17 DIAGNOSIS — D649 Anemia, unspecified: Secondary | ICD-10-CM

## 2021-12-17 DIAGNOSIS — E785 Hyperlipidemia, unspecified: Secondary | ICD-10-CM

## 2021-12-17 DIAGNOSIS — I10 Essential (primary) hypertension: Secondary | ICD-10-CM

## 2021-12-17 NOTE — Telephone Encounter (Signed)
?  Care Management  ? ?Follow Up Note ? ? ?12/17/2021 ?Name: Abigail [name] MRN: [id number] DOB: [birth year] ? ? ?Referred by: [name], FNP ?Reason for referral : Chronic Care Management (RN CM Follow up call #2) ? ? ?A second unsuccessful telephone outreach was attempted today. The patient was referred to the case management team for assistance with care management and care coordination.  ? ?Follow Up Plan: Telephone follow up appointment with care management team member scheduled for: 01/07/22 ? ?Barb Merino, RN, BSN, CCM ?Care Management Coordinator ?[facility]  ?Direct Phone: [phone] ? ? ?

## 2021-12-17 NOTE — Telephone Encounter (Signed)
?  Care Management  ? ?Follow Up Note ? ? ?12/17/2021 ?Name: Abigail [name] MRN: [id number] DOB: 1936/05/17 ? ? ?Referred by: [name], FNP ?Reason for referral : Chronic Care Management (RN CM Follow up call #2) ? ? ?A second unsuccessful telephone outreach was attempted today. The patient was referred to the case management team for assistance with care management and care coordination.  ? ?Follow Up Plan: A HIPPA compliant phone message was left for the patient providing contact information and requesting a return call.  ? ?Barb Merino, RN, BSN, CCM ?Care Management Coordinator ?[place] [facility]  ?Direct Phone: [phone] ? ? ?

## 2021-12-18 ENCOUNTER — Telehealth: Payer: Self-pay | Admitting: Physician Assistant

## 2021-12-18 ENCOUNTER — Inpatient Hospital Stay: Payer: PPO | Attending: Internal Medicine

## 2021-12-18 ENCOUNTER — Encounter: Payer: Self-pay | Admitting: Physician Assistant

## 2021-12-18 ENCOUNTER — Inpatient Hospital Stay: Payer: PPO

## 2021-12-18 ENCOUNTER — Other Ambulatory Visit: Payer: Self-pay

## 2021-12-18 ENCOUNTER — Inpatient Hospital Stay (HOSPITAL_BASED_OUTPATIENT_CLINIC_OR_DEPARTMENT_OTHER): Payer: PPO | Admitting: Physician Assistant

## 2021-12-18 VITALS — BP 135/69 | HR 73 | Temp 98.0°F | Resp 16 | Wt 164.9 lb

## 2021-12-18 DIAGNOSIS — Z5112 Encounter for antineoplastic immunotherapy: Secondary | ICD-10-CM

## 2021-12-18 DIAGNOSIS — C3490 Malignant neoplasm of unspecified part of unspecified bronchus or lung: Secondary | ICD-10-CM | POA: Diagnosis not present

## 2021-12-18 DIAGNOSIS — C3432 Malignant neoplasm of lower lobe, left bronchus or lung: Secondary | ICD-10-CM | POA: Diagnosis not present

## 2021-12-18 DIAGNOSIS — E039 Hypothyroidism, unspecified: Secondary | ICD-10-CM | POA: Diagnosis not present

## 2021-12-18 LAB — CMP (CANCER CENTER ONLY)
ALT: 10 U/L (ref 0–44)
AST: 13 U/L — ABNORMAL LOW (ref 15–41)
Albumin: 3.9 g/dL (ref 3.5–5.0)
Alkaline Phosphatase: 84 U/L (ref 38–126)
Anion gap: 6 (ref 5–15)
BUN: 25 mg/dL — ABNORMAL HIGH (ref 8–23)
CO2: 24 mmol/L (ref 22–32)
Calcium: 9.1 mg/dL (ref 8.9–10.3)
Chloride: 104 mmol/L (ref 98–111)
Creatinine: 1.28 mg/dL — ABNORMAL HIGH (ref 0.44–1.00)
GFR, Estimated: 42 mL/min — ABNORMAL LOW (ref >60)
Glucose, Bld: 170 mg/dL — ABNORMAL HIGH (ref 70–99)
Potassium: 4.2 mmol/L (ref 3.5–5.1)
Sodium: 134 mmol/L — ABNORMAL LOW (ref 135–145)
Total Bilirubin: 0.3 mg/dL (ref 0.3–1.2)
Total Protein: 7.3 g/dL (ref 6.5–8.1)

## 2021-12-18 LAB — CBC WITH DIFFERENTIAL (CANCER CENTER ONLY)
Abs Immature Granulocytes: 0.02 10*3/uL (ref 0.00–0.07)
Basophils Absolute: 0 10*3/uL (ref 0.0–0.1)
Basophils Relative: 0 %
Eosinophils Absolute: 0.1 10*3/uL (ref 0.0–0.5)
Eosinophils Relative: 1 %
HCT: 31.2 % — ABNORMAL LOW (ref 36.0–46.0)
Hemoglobin: 9.5 g/dL — ABNORMAL LOW (ref 12.0–15.0)
Immature Granulocytes: 0 %
Lymphocytes Relative: 4 %
Lymphs Abs: 0.2 10*3/uL — ABNORMAL LOW (ref 0.7–4.0)
MCH: 29.1 pg (ref 26.0–34.0)
MCHC: 30.4 g/dL (ref 30.0–36.0)
MCV: 95.4 fL (ref 80.0–100.0)
Monocytes Absolute: 0.6 10*3/uL (ref 0.1–1.0)
Monocytes Relative: 11 %
Neutro Abs: 4.6 10*3/uL (ref 1.7–7.7)
Neutrophils Relative %: 84 %
Platelet Count: 224 10*3/uL (ref 150–400)
RBC: 3.27 MIL/uL — ABNORMAL LOW (ref 3.87–5.11)
RDW: 14.1 % (ref 11.5–15.5)
WBC Count: 5.6 10*3/uL (ref 4.0–10.5)
nRBC: 0 % (ref 0.0–0.2)

## 2021-12-18 LAB — TSH: TSH: 16.519 u[IU]/mL — ABNORMAL HIGH (ref 0.350–4.500)

## 2021-12-18 MED ORDER — SODIUM CHLORIDE 0.9 % IV SOLN
1500.0000 mg | Freq: Once | INTRAVENOUS | Status: AC
  Administered 2021-12-18: 1500 mg via INTRAVENOUS
  Filled 2021-12-18: qty 30

## 2021-12-18 MED ORDER — SODIUM CHLORIDE 0.9 % IV SOLN: Freq: Once | INTRAVENOUS | Status: AC

## 2021-12-18 NOTE — Patient Instructions (Signed)
Visit Information ? ?Thank you for taking time to visit with me today. Please don't hesitate to contact me if I can be of assistance to you before our next scheduled telephone appointment. ? ?Following are the goals we discussed today:  ?(Copy and paste patient goals from clinical care plan here) ? ?Our next appointment is by telephone on 02/14/22 at 10:30 AM  ? ?Please call the care guide team at [phone] if you need to cancel or reschedule your appointment.  ? ?If you are experiencing a Mental Health or [place] or need someone to talk to, please call 1-800-273-TALK (toll free, 24 hour hotline)  ? ?The patient verbalized understanding of instructions, educational materials, and care plan provided today and agreed to receive a mailed copy of patient instructions, educational materials, and care plan.  ? ?Barb Merino, RN, BSN, CCM ?Care Management Coordinator ?[place] [facility]  ?Direct Phone: [phone] ? ? ?

## 2021-12-18 NOTE — Chronic Care Management (AMB) (Signed)
?Chronic Care Management  ? ?CCM RN Visit Note ? ?12/17/2021 ?Name: Abigail [name] MRN: [id number] DOB: [birth year] ? ?Subjective: ?Abigail [name] is a 86 y.o. year old female who is a primary care patient of [name], [place]. The care management team was consulted for assistance with disease management and care coordination needs.   ? ?Engaged with patient by telephone for follow up visit in response to provider referral for case management and/or care coordination services.  ? ?Consent to Services:  ?The patient was given information about Chronic Care Management services, agreed to services, and gave verbal consent prior to initiation of services.  Please see initial visit note for detailed documentation.  ? ?Patient agreed to services and verbal consent obtained.  ? ?Assessment: Review of patient past medical history, allergies, medications, health status, including review of consultants reports, laboratory and other test data, was performed as part of comprehensive evaluation and provision of chronic care management services.  ? ?SDOH (Social Determinants of Health) assessments and interventions performed: Yes, no acute changes   ? ?CCM Care Plan ? ?Allergies  ?Allergen Reactions  ? Codeine Nausea And Vomiting  ? Norvasc [Amlodipine] Rash and Other (See Comments)  ?  rash  ? ? ?Outpatient Encounter Medications as of 12/17/2021  ?Medication Sig  ? acetaminophen (TYLENOL) 325 MG tablet Take 2 tablets (650 mg total) by mouth every 6 (six) hours as needed for mild pain (or Fever >/= 101).  ? albuterol (VENTOLIN HFA) 108 (90 Base) MCG/ACT inhaler Inhale 2 puffs into the lungs every 6 (six) hours as needed for wheezing or shortness of breath.  ? aspirin EC 81 MG tablet Take 81 mg by mouth at bedtime.  ? atorvastatin (LIPITOR) 80 MG tablet Take 1 tablet (80 mg total) by mouth daily at 6 PM.  ? Cholecalciferol (VITAMIN D3 PO) Take 2 capsules by mouth daily.  ? Cranberry 1000 MG CAPS Take 1,000 mg by mouth daily at 12 noon.   ? cyclobenzaprine (FLEXERIL) 10 MG tablet Take 1 tablet (10 mg total) by mouth 3 (three) times daily as needed for muscle spasms.  ? docusate sodium (COLACE) 100 MG capsule Take 100-200 mg by mouth See admin instructions. Take 100 mg in the morning and 200 mg at bedtime  ? esomeprazole (NEXIUM) 20 MG capsule Take 1 capsule (20 mg total) by mouth 2 (two) times daily before a meal.  ? estradiol (ESTRACE) 0.1 MG/GM vaginal cream Place 0.5 g vaginally once a week. Place 0.5g twice a week at opening of vagina  ? ezetimibe (ZETIA) 10 MG tablet Take 1 tablet (10 mg total) by mouth daily.  ? ferrous sulfate 300 (60 Fe) MG/5ML syrup Take 5 mLs (300 mg total) by mouth 2 (two) times daily with a meal.  ? hydrOXYzine (ATARAX) 10 MG tablet Take 1 tablet (10 mg total) by mouth 3 (three) times daily as needed.  ? levothyroxine (SYNTHROID) 175 MCG tablet TAKE 1 TABLET BY MOUTH EVERY MORNING ON MONDAY - FRIDAY  ? lisinopril-hydrochlorothiazide (ZESTORETIC) 20-25 MG tablet Take 0.5 tablets by mouth 3 (three) times a week. Take on Monday Wednesday and Friday only  ? nitroGLYCERIN (NITROSTAT) 0.4 MG SL tablet Place 1 tablet (0.4 mg total) under the tongue every 5 (five) minutes x 3 doses as needed for chest pain. (Patient not taking: Reported on 12/18/2021)  ? prochlorperazine (COMPAZINE) 10 MG tablet Take 1 tablet (10 mg total) by mouth every 6 (six) hours as needed for nausea or vomiting. (Patient not  taking: Reported on 12/18/2021)  ? sucralfate (CARAFATE) 1 g tablet Take 1 tablet (1 g total) by mouth 4 (four) times daily -  with meals and at bedtime. Crush and dissolve in 10 mL of warm water, swallow prior to meals  ? Trospium Chloride 60 MG CP24 Take 1 capsule (60 mg total) by mouth daily.  ? ?No facility-administered encounter medications on file as of 12/17/2021.  ? ? ?Patient Active Problem List  ? Diagnosis Date Noted  ? Encounter for antineoplastic immunotherapy 11/20/2021  ? COPD (chronic obstructive pulmonary disease) ([place])  08/20/2021  ? Dysphagia 08/20/2021  ? History of lung cancer 07/31/2021  ? Malignant neoplasm of unspecified part of unspecified bronchus or lung ([place]) 07/25/2021  ? Encounter for antineoplastic chemotherapy 07/25/2021  ? Mediastinal adenopathy 07/15/2021  ? Pulmonary nodule 1 cm or greater in diameter 07/02/2021  ? Coronary artery disease 04/11/2021  ? Right bundle branch block 04/11/2021  ? Newly recognized heart murmur 04/11/2021  ? Occult blood positive stool 11/09/2020  ? Chest pain with moderate risk for cardiac etiology 12/01/2019  ? Urinary urgency 12/01/2019  ? Anxiety 02/09/2019  ? Prediabetes 12/29/2018  ? Essential hypertension 08/27/2018  ? Vitamin D deficiency 06/25/2018  ? Hyperlipidemia 06/25/2018  ? Hypothyroidism 06/25/2018  ? Benign hypertension with CKD (chronic kidney disease) stage III ([place]) 06/25/2018  ? Chronic kidney disease, stage III (moderate) ([place]) 06/25/2018  ? OAB (overactive bladder) 08/26/2016  ? CAP (community acquired pneumonia) 04/12/2012  ? Bacteremia 04/08/2012  ? LLQ abdominal pain 04/05/2012  ? Hyponatremia 04/05/2012  ? ? ?Conditions to be addressed/monitored: Acquired hypothyroidism, Anxiety, Essential hypertension, Prediabetes, Hyperlipidemia, Anemia, Lung Mass ? ?Care Plan : RN Care Manager Plan of Care  ?Updates made by Lynne Logan, RN since 12/17/2021 12:00 AM  ?  ? ?Problem: No plan of care established for management of chronic disease states (Acquired hypothyroidism, Anxiety, Essential hypertension, Prediabetes, Hyperlipidemia, Anemia, Lung Mass)   ?Priority: High  ?  ? ?Long-Range Goal: Establishment of plan of care for management of chronic disease states (Acquired hypothyroidism, Anxiety, Essential hypertension, Prediabetes, Hyperlipidemia, Anemia, Lung Mass)   ?Start Date: 10/30/2021  ?Expected End Date: 10/31/2022  ?Recent Progress: On track  ?Priority: High  ?Note:   ?Current Barriers:  ?Knowledge Deficits related to plan of care for management of Acquired  hypothyroidism, Anxiety, Essential hypertension, Prediabetes, Hyperlipidemia, Anemia, Lung Mass  ?Care Coordination needs related to Acquired hypothyroidism, Anxiety, Essential hypertension, Prediabetes, Hyperlipidemia, Anemia, Lung Mass ? ?RNCM Clinical Goal(s):  ?Patient will verbalize basic understanding of  Acquired hypothyroidism, Anxiety, Essential hypertension, Prediabetes, Hyperlipidemia, Anemia, Lung Mass disease process and self health management plan as evidenced by patient will report having no disease exacerbations related to her chronic disease states as listed above  ?take all medications exactly as prescribed and will call provider for medication related questions as evidenced by patient will demonstrate improved understanding of prescribed medications and rationale for usage as evidenced by patient teach back ?demonstrate Improved health management independence as evidenced by patient will report 100% adherence to her prescribed treatment plan  ?continue to work with RN Care Manager to address care management and care coordination needs related to  Acquired hypothyroidism, Anxiety, Essential hypertension, Prediabetes, Hyperlipidemia, Anemia, Lung Mass as evidenced by adherence to CM Team Scheduled appointments ?demonstrate ongoing self health care management ability  as evidenced by  through collaboration with RN Care manager, provider, and care team.  ? ?Interventions: ?1:1 collaboration with primary care provider regarding development and update  of comprehensive plan of care as evidenced by provider attestation and co-signature ?Inter-disciplinary care team collaboration (see longitudinal plan of care) ?Evaluation of current treatment plan related to  self management and patient's adherence to plan as established by provider ? ?Oncology:  (Status: Goal on track:  Yes.) Long Term Goal ?Assessment of understanding of oncology diagnosis: Malignant neoplasm of unspecified part of unspecified bronchus  or lung ?Assessed patient understanding of cancer diagnosis and recommended treatment plan, Reviewed upcoming provider appointments and treatment appointments, Assessed available transportation to appointments

## 2021-12-18 NOTE — Telephone Encounter (Signed)
I attempted to call the patient regarding her TSH. Her TSH is elevated. I would like to increase her synthroid dose but wanted to ensure that she is taking this before I adjust the dose. I left a voicemail asking her to return our call and let us know how she is taking this and what dose. Pending a call back. It looks like she has been prescribed the 175 mcg dose for several years.  ?

## 2021-12-18 NOTE — Patient Instructions (Signed)
[facility]  Discharge Instructions: °Thank you for choosing [facility] to provide your oncology and hematology care.  ° °If you have a lab appointment with the Cancer Center, please go directly to the Cancer Center and check in at the registration area. °  °Wear comfortable clothing and clothing appropriate for easy access to any Portacath or PICC line.  ° °We strive to give you quality time with your provider. You may need to reschedule your appointment if you arrive late (15 or more minutes).  Arriving late affects you and other patients whose appointments are after yours.  Also, if you miss three or more appointments without notifying the office, you may be dismissed from the clinic at the provider’s discretion.    °  °For prescription refill requests, have your pharmacy contact our office and allow 72 hours for refills to be completed.   ° °Today you received the following chemotherapy and/or immunotherapy agents :  Durvalumab °    °  °To help prevent nausea and vomiting after your treatment, we encourage you to take your nausea medication as directed. ° °BELOW ARE SYMPTOMS THAT SHOULD BE REPORTED IMMEDIATELY: °*FEVER GREATER THAN 100.4 F (38 °C) OR HIGHER °*CHILLS OR SWEATING °*NAUSEA AND VOMITING THAT IS NOT CONTROLLED WITH YOUR NAUSEA MEDICATION °*UNUSUAL SHORTNESS OF BREATH °*UNUSUAL BRUISING OR BLEEDING °*URINARY PROBLEMS (pain or burning when urinating, or frequent urination) °*BOWEL PROBLEMS (unusual diarrhea, constipation, pain near the anus) °TENDERNESS IN MOUTH AND THROAT WITH OR WITHOUT PRESENCE OF ULCERS (sore throat, sores in mouth, or a toothache) °UNUSUAL RASH, SWELLING OR PAIN  °UNUSUAL VAGINAL DISCHARGE OR ITCHING  ° °Items with * indicate a potential emergency and should be followed up as soon as possible or go to the Emergency Department if any problems should occur. ° °Please show the CHEMOTHERAPY ALERT CARD or IMMUNOTHERAPY ALERT CARD at  check-in to the Emergency Department and triage nurse. ° °Should you have questions after your visit or need to cancel or reschedule your appointment, please contact [facility]  Dept: [phone]  and follow the prompts.  Office hours are 8:00 a.m. to 4:30 p.m. Monday - Friday. Please note that voicemails left after 4:00 p.m. may not be returned until the following business day.  We are closed weekends and major holidays. You have access to a nurse at all times for urgent questions. Please call the main number to the clinic Dept: [phone] and follow the prompts. ° ° °For any non-urgent questions, you may also contact your provider using MyChart. We now offer e-Visits for anyone 18 and older to request care online for non-urgent symptoms. For details visit [email or web address]. °  °Also download the MyChart app! Go to the app store, search "MyChart", open the app, select , and log in with your MyChart username and password. ° °Due to Covid, a mask is required upon entering the hospital/clinic. If you do not have a mask, one will be given to you upon arrival. For doctor visits, patients may have 1 support person aged 18 or older with them. For treatment visits, patients cannot have anyone with them due to current Covid guidelines and our immunocompromised population.  ° °

## 2021-12-19 ENCOUNTER — Other Ambulatory Visit: Payer: Self-pay | Admitting: Physician Assistant

## 2021-12-19 DIAGNOSIS — E039 Hypothyroidism, unspecified: Secondary | ICD-10-CM

## 2021-12-19 MED ORDER — LEVOTHYROXINE SODIUM 200 MCG PO TABS: ORAL_TABLET | ORAL | 1 refills | Status: DC

## 2021-12-19 NOTE — Progress Notes (Signed)
I talked to the patient. She is currently taking 175 mcg of synthroid daily. She used to take it Mon-Friday. She also used to alternate 175 mcg and 200 mg daily previously. Her TSH from yesterday continues to increase. I have sent her a prescription for 200 mcg to alternate with 175 mcg. We will recheck her TSH at her next lab appointment in 4 weeks. She expressed understanding with the instructions.  ?

## 2021-12-20 ENCOUNTER — Other Ambulatory Visit: Payer: Self-pay | Admitting: Physician Assistant

## 2021-12-20 DIAGNOSIS — R062 Wheezing: Secondary | ICD-10-CM

## 2021-12-22 DIAGNOSIS — E039 Hypothyroidism, unspecified: Secondary | ICD-10-CM

## 2021-12-22 DIAGNOSIS — E785 Hyperlipidemia, unspecified: Secondary | ICD-10-CM | POA: Diagnosis not present

## 2021-12-22 DIAGNOSIS — I10 Essential (primary) hypertension: Secondary | ICD-10-CM

## 2021-12-22 DIAGNOSIS — D649 Anemia, unspecified: Secondary | ICD-10-CM | POA: Diagnosis not present

## 2021-12-30 ENCOUNTER — Ambulatory Visit (HOSPITAL_COMMUNITY): Payer: PPO | Attending: Cardiology

## 2021-12-30 DIAGNOSIS — Z85118 Personal history of other malignant neoplasm of bronchus and lung: Secondary | ICD-10-CM

## 2021-12-30 DIAGNOSIS — I5030 Unspecified diastolic (congestive) heart failure: Secondary | ICD-10-CM | POA: Diagnosis not present

## 2021-12-30 LAB — ECHOCARDIOGRAM COMPLETE
Area-P 1/2: 6.71 cm2
S' Lateral: 1.8 cm

## 2021-12-30 MED ORDER — PERFLUTREN LIPID MICROSPHERE
1.0000 mL | INTRAVENOUS | Status: AC | PRN
  Administered 2021-12-30: 3 mL via INTRAVENOUS

## 2022-01-07 ENCOUNTER — Telehealth: Payer: PPO

## 2022-01-15 ENCOUNTER — Inpatient Hospital Stay: Payer: PPO

## 2022-01-15 ENCOUNTER — Inpatient Hospital Stay (HOSPITAL_BASED_OUTPATIENT_CLINIC_OR_DEPARTMENT_OTHER): Payer: PPO | Admitting: Internal Medicine

## 2022-01-15 ENCOUNTER — Other Ambulatory Visit: Payer: Self-pay

## 2022-01-15 ENCOUNTER — Inpatient Hospital Stay: Payer: PPO | Attending: Internal Medicine

## 2022-01-15 VITALS — BP 132/66 | HR 79 | Temp 96.2°F | Resp 16 | Wt 161.1 lb

## 2022-01-15 DIAGNOSIS — Z79899 Other long term (current) drug therapy: Secondary | ICD-10-CM | POA: Insufficient documentation

## 2022-01-15 DIAGNOSIS — C3490 Malignant neoplasm of unspecified part of unspecified bronchus or lung: Secondary | ICD-10-CM

## 2022-01-15 DIAGNOSIS — C3432 Malignant neoplasm of lower lobe, left bronchus or lung: Secondary | ICD-10-CM | POA: Diagnosis not present

## 2022-01-15 DIAGNOSIS — Z5112 Encounter for antineoplastic immunotherapy: Secondary | ICD-10-CM

## 2022-01-15 LAB — CBC WITH DIFFERENTIAL (CANCER CENTER ONLY)
Abs Immature Granulocytes: 0.1 10*3/uL — ABNORMAL HIGH (ref 0.00–0.07)
Basophils Absolute: 0 10*3/uL (ref 0.0–0.1)
Basophils Relative: 0 %
Eosinophils Absolute: 0.2 10*3/uL (ref 0.0–0.5)
Eosinophils Relative: 2 %
HCT: 28.9 % — ABNORMAL LOW (ref 36.0–46.0)
Hemoglobin: 8.9 g/dL — ABNORMAL LOW (ref 12.0–15.0)
Immature Granulocytes: 1 %
Lymphocytes Relative: 4 %
Lymphs Abs: 0.3 10*3/uL — ABNORMAL LOW (ref 0.7–4.0)
MCH: 27.5 pg (ref 26.0–34.0)
MCHC: 30.8 g/dL (ref 30.0–36.0)
MCV: 89.2 fL (ref 80.0–100.0)
Monocytes Absolute: 0.6 10*3/uL (ref 0.1–1.0)
Monocytes Relative: 8 %
Neutro Abs: 5.9 10*3/uL (ref 1.7–7.7)
Neutrophils Relative %: 85 %
Platelet Count: 312 10*3/uL (ref 150–400)
RBC: 3.24 MIL/uL — ABNORMAL LOW (ref 3.87–5.11)
RDW: 15.3 % (ref 11.5–15.5)
WBC Count: 7 10*3/uL (ref 4.0–10.5)
nRBC: 0 % (ref 0.0–0.2)

## 2022-01-15 LAB — CMP (CANCER CENTER ONLY)
ALT: 10 U/L (ref 0–44)
AST: 12 U/L — ABNORMAL LOW (ref 15–41)
Albumin: 3.9 g/dL (ref 3.5–5.0)
Alkaline Phosphatase: 81 U/L (ref 38–126)
Anion gap: 7 (ref 5–15)
BUN: 27 mg/dL — ABNORMAL HIGH (ref 8–23)
CO2: 26 mmol/L (ref 22–32)
Calcium: 9.1 mg/dL (ref 8.9–10.3)
Chloride: 104 mmol/L (ref 98–111)
Creatinine: 1.24 mg/dL — ABNORMAL HIGH (ref 0.44–1.00)
GFR, Estimated: 43 mL/min — ABNORMAL LOW (ref >60)
Glucose, Bld: 145 mg/dL — ABNORMAL HIGH (ref 70–99)
Potassium: 4.5 mmol/L (ref 3.5–5.1)
Sodium: 137 mmol/L (ref 135–145)
Total Bilirubin: 0.2 mg/dL — ABNORMAL LOW (ref 0.3–1.2)
Total Protein: 7.5 g/dL (ref 6.5–8.1)

## 2022-01-15 LAB — TSH: TSH: 1.766 u[IU]/mL (ref 0.350–4.500)

## 2022-01-15 MED ORDER — SODIUM CHLORIDE 0.9 % IV SOLN
1500.0000 mg | Freq: Once | INTRAVENOUS | Status: AC
  Administered 2022-01-15: 1500 mg via INTRAVENOUS
  Filled 2022-01-15: qty 30

## 2022-01-15 MED ORDER — SODIUM CHLORIDE 0.9 % IV SOLN: Freq: Once | INTRAVENOUS | Status: AC

## 2022-01-15 NOTE — Patient Instructions (Signed)
[facility]  Discharge Instructions: Thank you for choosing [facility] to provide your oncology and hematology care.   If you have a lab appointment with the Cancer Center, please go directly to the Cancer Center and check in at the registration area.   Wear comfortable clothing and clothing appropriate for easy access to any Portacath or PICC line.   We strive to give you quality time with your provider. You may need to reschedule your appointment if you arrive late (15 or more minutes).  Arriving late affects you and other patients whose appointments are after yours.  Also, if you miss three or more appointments without notifying the office, you may be dismissed from the clinic at the provider's discretion.      For prescription refill requests, have your pharmacy contact our office and allow 72 hours for refills to be completed.    Today you received the following chemotherapy and/or immunotherapy agents Imfinzi      To help prevent nausea and vomiting after your treatment, we encourage you to take your nausea medication as directed.  BELOW ARE SYMPTOMS THAT SHOULD BE REPORTED IMMEDIATELY: *FEVER GREATER THAN 100.4 F (38 C) OR HIGHER *CHILLS OR SWEATING *NAUSEA AND VOMITING THAT IS NOT CONTROLLED WITH YOUR NAUSEA MEDICATION *UNUSUAL SHORTNESS OF BREATH *UNUSUAL BRUISING OR BLEEDING *URINARY PROBLEMS (pain or burning when urinating, or frequent urination) *BOWEL PROBLEMS (unusual diarrhea, constipation, pain near the anus) TENDERNESS IN MOUTH AND THROAT WITH OR WITHOUT PRESENCE OF ULCERS (sore throat, sores in mouth, or a toothache) UNUSUAL RASH, SWELLING OR PAIN  UNUSUAL VAGINAL DISCHARGE OR ITCHING   Items with * indicate a potential emergency and should be followed up as soon as possible or go to the Emergency Department if any problems should occur.  Please show the CHEMOTHERAPY ALERT CARD or IMMUNOTHERAPY ALERT CARD at check-in to the  Emergency Department and triage nurse.  Should you have questions after your visit or need to cancel or reschedule your appointment, please contact [facility]  Dept: [phone]  and follow the prompts.  Office hours are 8:00 a.m. to 4:30 p.m. Monday - Friday. Please note that voicemails left after 4:00 p.m. may not be returned until the following business day.  We are closed weekends and major holidays. You have access to a nurse at all times for urgent questions. Please call the main number to the clinic Dept: [phone] and follow the prompts.   For any non-urgent questions, you may also contact your provider using MyChart. We now offer e-Visits for anyone 18 and older to request care online for non-urgent symptoms. For details visit [email or web address].   Also download the MyChart app! Go to the app store, search "MyChart", open the app, select [facility], and log in with your MyChart username and password.  Due to Covid, a mask is required upon entering the hospital/clinic. If you do not have a mask, one will be given to you upon arrival. For doctor visits, patients may have 1 support person aged 18 or older with them. For treatment visits, patients cannot have anyone with them due to current Covid guidelines and our immunocompromised population.   

## 2022-01-15 NOTE — Progress Notes (Signed)
Abigail [name] [phone]   [phone]  OFFICE PROGRESS NOTE  [name], [place] [address] [address]  DIAGNOSIS: Stage IIIA (T2a, N2, M0) non-small cell lung cancer, adenocarcinoma presented with left lower lobe lung mass in addition to left hilar and mediastinal lymphadenopathy diagnosed in November 2022 with no actionable mutation on the guardant blood test   PRIOR THERAPY: Weekly concurrent chemoradiation with carboplatin for an AUC of 2 and paclitaxel 45 mg per metered squared.  First dose on 08/05/2021.  Status post 6 cycles.  Last dose was given on September 03, 2021.   CURRENT THERAPY: Consolidation treatment with immunotherapy with Imfinzi 1500 Mg IV every 4 weeks.  First dose October 23, 2021.  Status post 3 cycles.  INTERVAL HISTORY: Abigail [name] 86 y.o. female returns to the clinic today for follow-up visit.  The patient is feeling very well today with no concerning complaints.  She had 1 episode of nausea, vomiting and diarrhea at the mother day weekend.  She recovered well.  She denied having any current chest pain but continues to have cough productive of whitish sputum with no shortness of breath or hemoptysis.  She denied having any fever or chills.  She has no current nausea, vomiting, diarrhea or constipation.  She denied having any headache or visual changes.  She was supposed to have repeat CT scan of the chest before this visit but unfortunately it was incorrectly scheduled to be done next months.  The patient is here today for evaluation before starting cycle #4.   MEDICAL HISTORY: Past Medical History:  Diagnosis Date   Anemia    Arthritis    knees, back   Carotid artery occlusion    Chronic kidney disease    stage 3 ckd no nephrologist   Complete uterine prolapse with prolapse of anterior vaginal wall    Complication of anesthesia    hard to wake up per pt   Constipation    Coronary artery disease     Diverticulitis yrs ago coialitis   Dyspnea    History of blood transfusion    History of radiation therapy    right lung 08/05/2021-09/18/2021  Dr Gery Pray   Hypertension    Hypothyroid    Numbness    in hands at times   Pneumonia    Pre-diabetes    Scoliosis    STEMI (ST elevation myocardial infarction) ([place]) 10/26/2019   DES RCA   Wears dentures    full dentures   Wears glasses    for reading    ALLERGIES:  is allergic to codeine and norvasc [amlodipine].  MEDICATIONS:  Current Outpatient Medications  Medication Sig Dispense Refill   acetaminophen (TYLENOL) 325 MG tablet Take 2 tablets (650 mg total) by mouth every 6 (six) hours as needed for mild pain (or Fever >/= 101).     albuterol (VENTOLIN HFA) 108 (90 Base) MCG/ACT inhaler TAKE 2 PUFFS BY MOUTH EVERY 6 HOURS AS NEEDED FOR WHEEZE OR SHORTNESS OF BREATH 8.5 each 1   aspirin EC 81 MG tablet Take 81 mg by mouth at bedtime.     atorvastatin (LIPITOR) 80 MG tablet Take 1 tablet (80 mg total) by mouth daily at 6 PM. 90 tablet 3   Cholecalciferol (VITAMIN D3 PO) Take 2 capsules by mouth daily.     Cranberry 1000 MG CAPS Take 1,000 mg by mouth daily at 12 noon.     cyclobenzaprine (  FLEXERIL) 10 MG tablet Take 1 tablet (10 mg total) by mouth 3 (three) times daily as needed for muscle spasms. 30 tablet 1   docusate sodium (COLACE) 100 MG capsule Take 100-200 mg by mouth See admin instructions. Take 100 mg in the morning and 200 mg at bedtime     esomeprazole (NEXIUM) 20 MG capsule Take 1 capsule (20 mg total) by mouth 2 (two) times daily before a meal. 60 capsule 3   estradiol (ESTRACE) 0.1 MG/GM vaginal cream Place 0.5 g vaginally once a week. Place 0.5g twice a week at opening of vagina     ezetimibe (ZETIA) 10 MG tablet Take 1 tablet (10 mg total) by mouth daily. 90 tablet 3   ferrous sulfate 300 (60 Fe) MG/5ML syrup Take 5 mLs (300 mg total) by mouth 2 (two) times daily with a meal. 300 mL 11   hydrOXYzine (ATARAX) 10 MG  tablet Take 1 tablet (10 mg total) by mouth 3 (three) times daily as needed. 30 tablet 0   levothyroxine (SYNTHROID) 200 MCG tablet Please alternate 175 mcg and 200 mg dose tablet every other day 30 tablet 1   lisinopril-hydrochlorothiazide (ZESTORETIC) 20-25 MG tablet Take 0.5 tablets by mouth 3 (three) times a week. Take on Monday Wednesday and Friday only 90 tablet 1   nitroGLYCERIN (NITROSTAT) 0.4 MG SL tablet Place 1 tablet (0.4 mg total) under the tongue every 5 (five) minutes x 3 doses as needed for chest pain. (Patient not taking: Reported on 12/18/2021) 25 tablet 3   prochlorperazine (COMPAZINE) 10 MG tablet Take 1 tablet (10 mg total) by mouth every 6 (six) hours as needed for nausea or vomiting. (Patient not taking: Reported on 12/18/2021) 30 tablet 2   sucralfate (CARAFATE) 1 g tablet Take 1 tablet (1 g total) by mouth 4 (four) times daily -  with meals and at bedtime. Crush and dissolve in 10 mL of warm water, swallow prior to meals 120 tablet 1   Trospium Chloride 60 MG CP24 Take 1 capsule (60 mg total) by mouth daily. 30 capsule 11   No current facility-administered medications for this visit.    SURGICAL HISTORY:  Past Surgical History:  Procedure Laterality Date   ANTERIOR AND POSTERIOR REPAIR WITH SACROSPINOUS FIXATION N/A 12/20/2020   Procedure: SACROSPINOUS LIGAMENT FIXATION;  Surgeon: [name], MD;  Location[facility];  Service: Gynecology;  Laterality: N/A;  Total time requested for all procedures is 2 hours   [place] age 15   lower   bartholin cyst removal  age 57's   BLADDER SUSPENSION N/A 12/20/2020   Procedure: TRANSVAGINAL TAPE (TVT) PROCEDURE;  Surgeon: [name], MD;  Location: [facility];  Service: Gynecology;  Laterality: N/A;   BRONCHIAL BRUSHINGS  07/15/2021   Procedure: BRONCHIAL BRUSHINGS;  Surgeon: Collene Gobble, MD;  Location: [facility] ENDOSCOPY;  Service: Pulmonary;;   BRONCHIAL NEEDLE  ASPIRATION BIOPSY  07/15/2021   Procedure: BRONCHIAL NEEDLE ASPIRATION BIOPSIES;  Surgeon: Collene Gobble, MD;  Location: [facility] ENDOSCOPY;  Service: Pulmonary;;   CORONARY/GRAFT ACUTE MI REVASCULARIZATION N/A 10/26/2019   Procedure: Coronary/Graft Acute MI Revascularization;  Surgeon: Sherren Mocha, MD;  Location: [facility];  Service: Cardiovascular;  Laterality: N/A;   CYSTOCELE REPAIR N/A 12/20/2020   Procedure: ANTERIOR AND POSTERIOR REPAIR WITH PERINEORRHAPHY;  Surgeon: [name], MD;  Location: [facility];  Service: Gynecology;  Laterality: N/A;   CYSTOSCOPY N/A 12/20/2020   Procedure:  CYSTOSCOPY;  Surgeon: [name], MD;  Location[facility];  Service: Gynecology;  Laterality: N/A;   ELBOW SURGERY  1990's   left   ENDOBRONCHIAL ULTRASOUND N/A 07/15/2021   Procedure: ENDOBRONCHIAL ULTRASOUND;  Surgeon: Collene Gobble, MD;  Location: [facility] ENDOSCOPY;  Service: Pulmonary;  Laterality: N/A;   HEMOSTASIS CONTROL  07/15/2021   Procedure: HEMOSTASIS CONTROL;  Surgeon: Collene Gobble, MD;  Location: [facility] ENDOSCOPY;  Service: Pulmonary;;   LEFT HEART CATH AND CORONARY ANGIOGRAPHY N/A 10/26/2019   Procedure: LEFT HEART CATH AND CORONARY ANGIOGRAPHY;  Surgeon: Sherren Mocha, MD;  Location: [place] [facility];  Service: Cardiovascular;  Laterality: N/A;   TONSILLECTOMY      REVIEW OF SYSTEMS:  A comprehensive review of systems was negative except for: Constitutional: positive for fatigue Respiratory: positive for cough Musculoskeletal: positive for muscle weakness   PHYSICAL EXAMINATION: General appearance: alert, cooperative, fatigued, and no distress Head: Normocephalic, without obvious abnormality, atraumatic Neck: no adenopathy, no JVD, supple, symmetrical, trachea midline, and thyroid not enlarged, symmetric, no tenderness/mass/nodules Lymph nodes: Cervical, supraclavicular, and axillary nodes normal. Resp: clear to  auscultation bilaterally Back: symmetric, no curvature. ROM normal. No CVA tenderness. Cardio: regular rate and rhythm, S1, S2 normal, no murmur, click, rub or gallop GI: soft, non-tender; bowel sounds normal; no masses,  no organomegaly Extremities: extremities normal, atraumatic, no cyanosis or edema  ECOG PERFORMANCE STATUS: 1 - Symptomatic but completely ambulatory  Blood pressure 132/66, pulse 79, temperature (!) 96.2 F (35.7 C), temperature source Tympanic, resp. rate 16, weight 161 lb 2 oz (73.1 kg), SpO2 95 %.  LABORATORY DATA: Lab Results  Component Value Date   WBC 7.0 01/15/2022   HGB 8.9 (L) 01/15/2022   HCT 28.9 (L) 01/15/2022   MCV 89.2 01/15/2022   PLT 312 01/15/2022      Chemistry      Component Value Date/Time   NA 137 01/15/2022 0908   NA 137 08/28/2021 0923   K 4.5 01/15/2022 0908   CL 104 01/15/2022 0908   CO2 26 01/15/2022 0908   BUN 27 (H) 01/15/2022 0908   BUN 20 08/28/2021 0923   CREATININE 1.24 (H) 01/15/2022 0908   CREATININE 1.08 04/18/2014 1516   GLU 104 12/23/2017 1111      Component Value Date/Time   CALCIUM 9.1 01/15/2022 0908   ALKPHOS 81 01/15/2022 0908   AST 12 (L) 01/15/2022 0908   ALT 10 01/15/2022 0908   BILITOT 0.2 (L) 01/15/2022 0908       RADIOGRAPHIC STUDIES: ECHOCARDIOGRAM COMPLETE  Result Date: 12/30/2021    ECHOCARDIOGRAM REPORT   Patient Name:   Abigail [name]  Date of Exam: 12/30/2021 Medical Rec #:  [id number]     Height:       66.0 in Accession #:    [id number]    Weight:       164.9 lb Date of Birth:  1936-04-14     BSA:          1.842 m Patient Age:    40 years      BP:           135/69 mmHg Patient Gender: F             HR:           94 bpm. Exam Location:  [address] Procedure: 2D Echo, Cardiac Doppler, Color Doppler and Intracardiac            Opacification Agent Indications:  Z85.118 Lung cancer  History:        Patient has prior history of Echocardiogram examinations, most                 recent 05/03/2021. CAD and  Previous Myocardial Infarction, COPD,                 Arrythmias:RBBB, Signs/Symptoms:Chest Pain, Murmur and Dyspnea;                 Risk Factors:Hypertension and Dyslipidemia. Prediabetes.  Sonographer:    Diamond Nickel RCS Referring Phys: [facility] CHANDRASEKHAR IMPRESSIONS  1. Left ventricular ejection fraction, by estimation, is 65 to 70%. The left ventricle has hyperdynamic function. The left ventricle has no regional wall motion abnormalities. There is mild left ventricular hypertrophy. Left ventricular diastolic parameters are consistent with Grade I diastolic dysfunction (impaired relaxation).  2. Right ventricular systolic function is normal. The right ventricular size is normal. Tricuspid regurgitation signal is inadequate for assessing PA pressure.  3. The mitral valve is normal in structure. No evidence of mitral valve regurgitation. No evidence of mitral stenosis.  4. The aortic valve is tricuspid. There is mild calcification of the aortic valve. Aortic valve regurgitation is not visualized. Aortic valve sclerosis is present, with no evidence of aortic valve stenosis.  5. The inferior vena cava is normal in size with greater than 50% respiratory variability, suggesting right atrial pressure of 3 mmHg. FINDINGS  Left Ventricle: Left ventricular ejection fraction, by estimation, is 65 to 70%. The left ventricle has hyperdynamic function. The left ventricle has no regional wall motion abnormalities. The left ventricular internal cavity size was normal in size. There is mild left ventricular hypertrophy. Left ventricular diastolic parameters are consistent with Grade I diastolic dysfunction (impaired relaxation). Right Ventricle: The right ventricular size is normal. No increase in right ventricular wall thickness. Right ventricular systolic function is normal. Tricuspid regurgitation signal is inadequate for assessing PA pressure. Left Atrium: Left atrial size was normal in size. Right Atrium: Right  atrial size was normal in size. Pericardium: Trivial pericardial effusion is present. Mitral Valve: The mitral valve is normal in structure. No evidence of mitral valve regurgitation. No evidence of mitral valve stenosis. Tricuspid Valve: The tricuspid valve is normal in structure. Tricuspid valve regurgitation is not demonstrated. Aortic Valve: The aortic valve is tricuspid. There is mild calcification of the aortic valve. Aortic valve regurgitation is not visualized. Aortic valve sclerosis is present, with no evidence of aortic valve stenosis. Pulmonic Valve: The pulmonic valve was normal in structure. Pulmonic valve regurgitation is trivial. Aorta: The aortic root is normal in size and structure. Venous: The inferior vena cava is normal in size with greater than 50% respiratory variability, suggesting right atrial pressure of 3 mmHg. IAS/Shunts: No atrial level shunt detected by color flow Doppler.  LEFT VENTRICLE PLAX 2D LVIDd:         4.20 cm   Diastology LVIDs:         1.80 cm   LV e' medial:    7.07 cm/s LV PW:         1.30 cm   LV E/e' medial:  9.9 LV IVS:        1.40 cm   LV e' lateral:   5.44 cm/s LVOT diam:     2.00 cm   LV E/e' lateral: 12.9 LV SV:         49 LV SV Index:   26 LVOT Area:  3.14 cm  RIGHT VENTRICLE RV S prime:     10.20 cm/s TAPSE (M-mode): 2.8 cm LEFT ATRIUM           Index LA diam:      2.20 cm 1.19 cm/m LA Vol (A4C): 41.0 ml 22.25 ml/m  AORTIC VALVE LVOT Vmax:   113.00 cm/s LVOT Vmean:  64.300 cm/s LVOT VTI:    0.155 m  AORTA Ao Root diam: 2.60 cm Ao Asc diam:  2.70 cm MITRAL VALVE MV Area (PHT): 6.71 cm    SHUNTS MV Decel Time: 113 msec    Systemic VTI:  0.16 m MV E velocity: 70.20 cm/s  Systemic Diam: 2.00 cm MV A velocity: 92.20 cm/s MV E/A ratio:  0.76 Dalton McleanMD Electronically signed by Franki Monte Signature Date/Time: 12/30/2021/2:30:29 PM    Final     ASSESSMENT AND PLAN: This is a very pleasant 85 years old white female with stage IIIA (T2a, N2, M0) non-small  cell lung cancer, adenocarcinoma presented with left lower lobe lung mass in addition to left hilar and mediastinal lymphadenopathy diagnosed in November 2022 with no actionable mutations The patient is currently undergoing a course of concurrent chemotherapy with radiation.  Her chemotherapy is in the form of carboplatin for AUC of 2 and paclitaxel 45 Mg/M2 status post 6 cycles.  She has been tolerating her treatment with concurrent chemoradiation fairly well except for the radiation-induced odynophagia and dysphagia. Her scan showed significant improvement in her disease with 65% reduction in the volume of the left lower lobe nodule.  She continues to have persistent left hilar and infrahilar adenopathy. The patient is currently undergoing treatment with consolidation immunotherapy with Imfinzi 1500 Mg IV every 4 weeks.  Status post 3 cycles. The patient has been tolerating her treatment with immunotherapy fairly well. I recommended for her to proceed with cycle #4 today as planned. I will see her back for follow-up visit in 4 weeks for evaluation after repeating CT scan of the chest for restaging of her disease. The patient was advised to call immediately if she has any other concerning symptoms in the interval. The patient voices understanding of current disease status and treatment options and is in agreement with the current care plan.  All questions were answered. The patient knows to call the clinic with any problems, questions or concerns. We can certainly see the patient much sooner if necessary.  Disclaimer: This note was dictated with voice recognition software. Similar sounding words can inadvertently be transcribed and may not be corrected upon review.

## 2022-02-04 NOTE — Progress Notes (Unsigned)
Cardiology Office Note   Date:  02/05/2022   ID:  Abigail [name], DOB [birth year], MRN [id number]  PCP:  [name], FNP    No chief complaint on file.  CAD/Old MI  Wt Readings from Last 3 Encounters:  02/05/22 159 lb (72.1 kg)  01/15/22 161 lb 2 oz (73.1 kg)  12/18/21 164 lb 14.4 oz (74.8 kg)       History of Present Illness: Abigail [name] is a 86 y.o. female  who has had presyncope, hyponatremia in the past.  I saw her in 10/2018 for these issues.    In March 2021, she presented with an ST elevation MI.   Cath report showed: "A drug-eluting stent was successfully placed using a SYNERGY XD 2.75X24. Post intervention, there is a 0% residual stenosis.   1.  Severe single-vessel coronary artery disease with a proximal RCA culprit lesion, treated successfully with primary PCI using a 2.75 x 24 mm Synergy DES 2.  Mild diffuse nonobstructive disease involving the LAD and left circumflex without any significant stenoses 3.  Normal LVEDP 4.  Moderately severe diffuse residual stenosis in the mid and distal RCA, favor initial medical therapy."   Echo 2021: "Left ventricular ejection fraction, by estimation, is 60 to 65%. The  left ventricle has normal function. The left ventricle has no regional  wall motion abnormalities. Left ventricular diastolic parameters were  normal.   2. Right ventricular systolic function is normal. The right ventricular  size is normal.   3. Left atrial size was mildly dilated.   4. The mitral valve is normal in structure and function. No evidence of  mitral valve regurgitation. No evidence of mitral stenosis.   5. The aortic valve is normal in structure and function. Aortic valve  regurgitation is not visualized. No aortic stenosis is present. "   Walking was limited due to GYN issues.     She had angina in 10/2020 and cath was planned but she was found to be severely anemic and required urgent GYN surgery.  Has done well  Cleared to return to  work with weight restriction of 10 lbs.  Got chemo in late 2022.   She has some unsteadiness.  Using walker.  She has not fallen.   Denies :  Leg edema. Orthopnea. Palpitations. Paroxysmal nocturnal dyspnea. Shortness of breath. Syncope.    Used NTG x 1- about 1 months ago.  Had some transient dizziness.   Hbg has been low.  May be getting more immunotherapy.  She had a blood transfusion several months. No GYN bleeding.  She has not been taking her iron as prescribed.    Past Medical History:  Diagnosis Date   Anemia    Arthritis    knees, back   Carotid artery occlusion    Chronic kidney disease    stage 3 ckd no nephrologist   Complete uterine prolapse with prolapse of anterior vaginal wall    Complication of anesthesia    hard to wake up per pt   Constipation    Coronary artery disease    Diverticulitis yrs ago coialitis   Dyspnea    History of blood transfusion    History of radiation therapy    right lung 08/05/2021-09/18/2021  Dr Gery Pray   Hypertension    Hypothyroid    Numbness    in hands at times   Pneumonia    Pre-diabetes    Scoliosis    STEMI (ST elevation myocardial infarction) (  [place]) 10/26/2019   DES RCA   Wears dentures    full dentures   Wears glasses    for reading    Past Surgical History:  Procedure Laterality Date   ANTERIOR AND POSTERIOR REPAIR WITH SACROSPINOUS FIXATION N/A 12/20/2020   Procedure: SACROSPINOUS LIGAMENT FIXATION;  Surgeon: [name], MD;  Location: [facility];  Service: Gynecology;  Laterality: N/A;  Total time requested for all procedures is 2 hours   [place] age 28   lower   bartholin cyst removal  age 49's   BLADDER SUSPENSION N/A 12/20/2020   Procedure: TRANSVAGINAL TAPE (TVT) PROCEDURE;  Surgeon: [name], MD;  Location: [facility];  Service: Gynecology;  Laterality: N/A;   BRONCHIAL BRUSHINGS  07/15/2021   Procedure: BRONCHIAL BRUSHINGS;  Surgeon:  Collene Gobble, MD;  Location: [facility] ENDOSCOPY;  Service: Pulmonary;;   BRONCHIAL NEEDLE ASPIRATION BIOPSY  07/15/2021   Procedure: BRONCHIAL NEEDLE ASPIRATION BIOPSIES;  Surgeon: Collene Gobble, MD;  Location: [facility] ENDOSCOPY;  Service: Pulmonary;;   CORONARY/GRAFT ACUTE MI REVASCULARIZATION N/A 10/26/2019   Procedure: Coronary/Graft Acute MI Revascularization;  Surgeon: Sherren Mocha, MD;  Location: [facility];  Service: Cardiovascular;  Laterality: N/A;   CYSTOCELE REPAIR N/A 12/20/2020   Procedure: ANTERIOR AND POSTERIOR REPAIR WITH PERINEORRHAPHY;  Surgeon: [name], MD;  Location: [facility];  Service: Gynecology;  Laterality: N/A;   CYSTOSCOPY N/A 12/20/2020   Procedure: CYSTOSCOPY;  Surgeon: [name], MD;  Location: [facility];  Service: Gynecology;  Laterality: N/A;   ELBOW SURGERY  1990's   left   ENDOBRONCHIAL ULTRASOUND N/A 07/15/2021   Procedure: ENDOBRONCHIAL ULTRASOUND;  Surgeon: Collene Gobble, MD;  Location: [facility] ENDOSCOPY;  Service: Pulmonary;  Laterality: N/A;   HEMOSTASIS CONTROL  07/15/2021   Procedure: HEMOSTASIS CONTROL;  Surgeon: Collene Gobble, MD;  Location: [facility] ENDOSCOPY;  Service: Pulmonary;;   LEFT HEART CATH AND CORONARY ANGIOGRAPHY N/A 10/26/2019   Procedure: LEFT HEART CATH AND CORONARY ANGIOGRAPHY;  Surgeon: Sherren Mocha, MD;  Location: [place] [facility];  Service: Cardiovascular;  Laterality: N/A;   TONSILLECTOMY       Current Outpatient Medications  Medication Sig Dispense Refill   acetaminophen (TYLENOL) 325 MG tablet Take 2 tablets (650 mg total) by mouth every 6 (six) hours as needed for mild pain (or Fever >/= 101).     albuterol (VENTOLIN HFA) 108 (90 Base) MCG/ACT inhaler TAKE 2 PUFFS BY MOUTH EVERY 6 HOURS AS NEEDED FOR WHEEZE OR SHORTNESS OF BREATH 8.5 each 1   aspirin EC 81 MG tablet Take 81 mg by mouth at bedtime.     atorvastatin (LIPITOR) 80 MG tablet Take 1 tablet (80 mg total)  by mouth daily at 6 PM. 90 tablet 3   Cholecalciferol (VITAMIN D3 PO) Take 2 capsules by mouth daily.     Cranberry 1000 MG CAPS Take 1,000 mg by mouth daily at 12 noon.     cyclobenzaprine (FLEXERIL) 10 MG tablet Take 1 tablet (10 mg total) by mouth 3 (three) times daily as needed for muscle spasms. 30 tablet 1   docusate sodium (COLACE) 100 MG capsule Take 100-200 mg by mouth See admin instructions. Take 100 mg in the morning and 200 mg at bedtime     esomeprazole (NEXIUM) 20 MG capsule Take 1 capsule (20 mg total) by mouth 2 (two) times daily before a meal. 60 capsule 3   estradiol (  ESTRACE) 0.1 MG/GM vaginal cream Place 0.5 g vaginally once a week. Place 0.5g twice a week at opening of vagina     ezetimibe (ZETIA) 10 MG tablet Take 1 tablet (10 mg total) by mouth daily. 90 tablet 3   ferrous sulfate 300 (60 Fe) MG/5ML syrup Take 5 mLs (300 mg total) by mouth 2 (two) times daily with a meal. 300 mL 11   hydrOXYzine (ATARAX) 10 MG tablet Take 1 tablet (10 mg total) by mouth 3 (three) times daily as needed. 30 tablet 0   levothyroxine (SYNTHROID) 200 MCG tablet Please alternate 175 mcg and 200 mg dose tablet every other day 30 tablet 1   lisinopril-hydrochlorothiazide (ZESTORETIC) 20-25 MG tablet Take 0.5 tablets by mouth 3 (three) times a week. Take on Monday Wednesday and Friday only 90 tablet 1   nitroGLYCERIN (NITROSTAT) 0.4 MG SL tablet Place 1 tablet (0.4 mg total) under the tongue every 5 (five) minutes x 3 doses as needed for chest pain. 25 tablet 3   prochlorperazine (COMPAZINE) 10 MG tablet Take 1 tablet (10 mg total) by mouth every 6 (six) hours as needed for nausea or vomiting. 30 tablet 2   sucralfate (CARAFATE) 1 g tablet Take 1 tablet (1 g total) by mouth 4 (four) times daily -  with meals and at bedtime. Crush and dissolve in 10 mL of warm water, swallow prior to meals 120 tablet 1   Trospium Chloride 60 MG CP24 Take 1 capsule (60 mg total) by mouth daily. 30 capsule 11   No current  facility-administered medications for this visit.    Allergies:   Codeine and Norvasc [amlodipine]    Social History:  The patient  reports that she quit smoking about 35 years ago. Her smoking use included cigarettes. She has a 52.50 pack-year smoking history. She has never used smokeless tobacco. She reports that she does not drink alcohol and does not use drugs.   Family History:  The patient's family history includes Hypertension in her mother.    ROS:  Please see the history of present illness.   Otherwise, review of systems are positive for not eating well due to refrigerator problems.   All other systems are reviewed and negative.    PHYSICAL EXAM: VS:  BP 130/60   Pulse 80   Ht 5\' 6"  (1.676 m)   Wt 159 lb (72.1 kg)   SpO2 92%   BMI 25.66 kg/m  , BMI Body mass index is 25.66 kg/m. GEN: Well nourished, well developed, in no acute distress HEENT: normal Neck: no JVD, carotid bruits, or masses Cardiac: RRR; no murmurs, rubs, or gallops,no edema  Respiratory:  clear to auscultation bilaterally, normal work of breathing GI: soft, nontender, nondistended, + BS MS: no deformity or atrophy Skin: warm and dry, no rash Neuro:  Strength and sensation are intact Psych: euthymic mood, full affect   EKG:   The ekg ordered today demonstrates NSR, RBBB, no ST changes   Recent Labs: 01/15/2022: ALT 10; BUN 27; Creatinine 1.24; Hemoglobin 8.9; Platelet Count 312; Potassium 4.5; Sodium 137; TSH 1.766   Lipid Panel    Component Value Date/Time   CHOL 152 07/10/2021 1118   TRIG 134 07/10/2021 1118   HDL 44 07/10/2021 1118   CHOLHDL 3.5 07/10/2021 1118   CHOLHDL 4.8 10/26/2019 2246   VLDL 40 10/26/2019 2246   LDLCALC 84 07/10/2021 1118     Other studies Reviewed: Additional studies/ records that were reviewed today with results demonstrating:  labs reviewed, LDL 84.   ASSESSMENT AND PLAN:  CAD/old MI: As long as the hemoglobin does not drop too low, anginal symptoms are  controlled.  Work on treating anemia.  I stressed the importance of her taking her iron.  Followed at the cancer center. Carotid disease: Mild in 2019 Hypertension: The current medical regimen is effective;  continue present plan and medications. Hyperlipidemia: The current medical regimen is effective;  continue present plan and medications.  Whole food, plant-based diet.  High-fiber diet.  Avoiding processed foods. Lung CA: managed by Dr. Julien Nordmann.  Hbg checked regularly with them.    Current medicines are reviewed at length with the patient today.  The patient concerns regarding her medicines were addressed.  The following changes have been made:  No change  Labs/ tests ordered today include:  No orders of the defined types were placed in this encounter.   Recommend 150 minutes/week of aerobic exercise Low fat, low carb, high fiber diet recommended  Disposition:   FU in 6 months   Signed, Larae Grooms, MD  02/05/2022 12:08 PM    [facility] [place], [place], [place]  [address] Phone: [phone]; Fax: [phone]

## 2022-02-05 ENCOUNTER — Encounter: Payer: Self-pay | Admitting: Interventional Cardiology

## 2022-02-05 ENCOUNTER — Ambulatory Visit (INDEPENDENT_AMBULATORY_CARE_PROVIDER_SITE_OTHER): Payer: PPO | Admitting: Interventional Cardiology

## 2022-02-05 VITALS — BP 130/60 | HR 80 | Ht 66.0 in | Wt 159.0 lb

## 2022-02-05 DIAGNOSIS — I252 Old myocardial infarction: Secondary | ICD-10-CM | POA: Diagnosis not present

## 2022-02-05 DIAGNOSIS — I6529 Occlusion and stenosis of unspecified carotid artery: Secondary | ICD-10-CM

## 2022-02-05 DIAGNOSIS — I25118 Atherosclerotic heart disease of native coronary artery with other forms of angina pectoris: Secondary | ICD-10-CM

## 2022-02-05 DIAGNOSIS — E782 Mixed hyperlipidemia: Secondary | ICD-10-CM | POA: Diagnosis not present

## 2022-02-05 DIAGNOSIS — I10 Essential (primary) hypertension: Secondary | ICD-10-CM

## 2022-02-05 NOTE — Patient Instructions (Signed)
Medication Instructions:  Your physician recommends that you continue on your current medications as directed. Please refer to the Current Medication list given to you today.  *If you need a refill on your cardiac medications before your next appointment, please call your pharmacy*   Lab Work: none If you have labs (blood work) drawn today and your tests are completely normal, you will receive your results only by: [place] (if you have MyChart) OR A paper copy in the mail If you have any lab test that is abnormal or we need to change your treatment, we will call you to review the results.   Testing/Procedures: none   Follow-Up: At [facility], you and your health needs are our priority.  As part of our continuing mission to provide you with exceptional heart care, we have created designated Provider Care Teams.  These Care Teams include your primary Cardiologist (physician) and Advanced Practice Providers (APPs -  Physician Assistants and Nurse Practitioners) who all work together to provide you with the care you need, when you need it.  We recommend signing up for the patient portal called "MyChart".  Sign up information is provided on this After Visit Summary.  MyChart is used to connect with patients for Virtual Visits (Telemedicine).  Patients are able to view lab/test results, encounter notes, upcoming appointments, etc.  Non-urgent messages can be sent to your provider as well.   To learn more about what you can do with MyChart, go to [email or web address].    Your next appointment:   August 05, 2022 at 11:00  The format for your next appointment:   In Person  Provider:   Larae Grooms, MD     Other Instructions    Important Information About Sugar

## 2022-02-10 ENCOUNTER — Other Ambulatory Visit: Payer: Self-pay | Admitting: Physician Assistant

## 2022-02-10 ENCOUNTER — Ambulatory Visit (HOSPITAL_COMMUNITY)
Admission: RE | Admit: 2022-02-10 | Discharge: 2022-02-10 | Disposition: A | Discharge status: Home / Self Care | Payer: PPO | Source: Ambulatory Visit | Attending: Physician Assistant | Admitting: Physician Assistant

## 2022-02-10 ENCOUNTER — Encounter: Payer: Self-pay | Admitting: *Deleted

## 2022-02-10 DIAGNOSIS — C3490 Malignant neoplasm of unspecified part of unspecified bronchus or lung: Secondary | ICD-10-CM | POA: Diagnosis not present

## 2022-02-10 DIAGNOSIS — J439 Emphysema, unspecified: Secondary | ICD-10-CM | POA: Diagnosis not present

## 2022-02-10 DIAGNOSIS — J984 Other disorders of lung: Secondary | ICD-10-CM | POA: Diagnosis not present

## 2022-02-10 DIAGNOSIS — J90 Pleural effusion, not elsewhere classified: Secondary | ICD-10-CM | POA: Diagnosis not present

## 2022-02-10 LAB — POCT I-STAT CREATININE: Creatinine, Ser: 1.5 mg/dL — ABNORMAL HIGH (ref 0.44–1.00)

## 2022-02-10 MED ORDER — IOHEXOL 300 MG/ML  SOLN: 75.0000 mL | Freq: Once | INTRAMUSCULAR | Status: DC | PRN

## 2022-02-10 MED ORDER — SODIUM CHLORIDE (PF) 0.9 % IJ SOLN
INTRAMUSCULAR | Status: AC
  Filled 2022-02-10: qty 50

## 2022-02-10 NOTE — Progress Notes (Signed)
I followed up on Abigail [name]'s schedule. She is set up for scan and follow up with med onc at this time.

## 2022-02-11 NOTE — Progress Notes (Unsigned)
[facility] OFFICE PROGRESS NOTE  [name], [address] [place] [address]  DIAGNOSIS: Stage IIIA (T2a, N2, M0) non-small cell lung cancer, adenocarcinoma presented with left lower lobe lung mass in addition to left hilar and mediastinal lymphadenopathy diagnosed in November 2022 with no actionable mutation on the guardant blood test  PRIOR THERAPY:  Weekly concurrent chemoradiation with carboplatin for an AUC of 2 and paclitaxel 45 mg per metered squared.  First dose on 08/05/2021.  Status post 6 cycles.  Last dose was given on September 03, 2021.  CURRENT THERAPY: Consolidation treatment with immunotherapy with Imfinzi 1500 Mg IV every 4 weeks.  First dose October 23, 2021.  Status post 4 cycles.  INTERVAL HISTORY: Abigail [name] 86 y.o. female returns  to the clinic today for a follow-up visit. She is currently undergoing consolidation immunotherapy with Imfinzi. She is status post 2 cycles. She tolerates this well without any concerning adverse side effects except she ***. She has some fatigue. She is recovering from her odynophagia/dysphagia.  She still reports a poor appetite but gained about 5lbs since last being seen. She denies any fever or night sweats.  She denies any chest pain or hemoptysis.  She reports dyspnea with certain activities which is stable. She reports some phlegm/cough which is also similar to her baseline. She has delsym and mucinex at home but does not like taking medications. She sometimes has alternating diarrhea/constipation but none recently. She denies headaches or visual changes. She denies any rashes or skin changes. She recently had a restaging CT scan. She is here for evaluation and to review her scan results before starting cycle #5.   MEDICAL HISTORY: Past Medical History:  Diagnosis Date   Anemia    Arthritis    knees, back   Carotid artery occlusion    Chronic kidney disease    stage 3 ckd no nephrologist   Complete  uterine prolapse with prolapse of anterior vaginal wall    Complication of anesthesia    hard to wake up per pt   Constipation    Coronary artery disease    Diverticulitis yrs ago coialitis   Dyspnea    History of blood transfusion    History of radiation therapy    right lung 08/05/2021-09/18/2021  Dr Gery Pray   Hypertension    Hypothyroid    Numbness    in hands at times   Pneumonia    Pre-diabetes    Scoliosis    STEMI (ST elevation myocardial infarction) ([facility]) 10/26/2019   DES RCA   Wears dentures    full dentures   Wears glasses    for reading    ALLERGIES:  is allergic to codeine and norvasc [amlodipine].  MEDICATIONS:  Current Outpatient Medications  Medication Sig Dispense Refill   acetaminophen (TYLENOL) 325 MG tablet Take 2 tablets (650 mg total) by mouth every 6 (six) hours as needed for mild pain (or Fever >/= 101).     albuterol (VENTOLIN HFA) 108 (90 Base) MCG/ACT inhaler TAKE 2 PUFFS BY MOUTH EVERY 6 HOURS AS NEEDED FOR WHEEZE OR SHORTNESS OF BREATH 8.5 each 1   aspirin EC 81 MG tablet Take 81 mg by mouth at bedtime.     atorvastatin (LIPITOR) 80 MG tablet Take 1 tablet (80 mg total) by mouth daily at 6 PM. 90 tablet 3   Cholecalciferol (VITAMIN D3 PO) Take 2 capsules by mouth daily.     Cranberry 1000 MG CAPS  Take 1,000 mg by mouth daily at 12 noon.     cyclobenzaprine (FLEXERIL) 10 MG tablet Take 1 tablet (10 mg total) by mouth 3 (three) times daily as needed for muscle spasms. 30 tablet 1   docusate sodium (COLACE) 100 MG capsule Take 100-200 mg by mouth See admin instructions. Take 100 mg in the morning and 200 mg at bedtime     esomeprazole (NEXIUM) 20 MG capsule Take 1 capsule (20 mg total) by mouth 2 (two) times daily before a meal. 60 capsule 3   estradiol (ESTRACE) 0.1 MG/GM vaginal cream Place 0.5 g vaginally once a week. Place 0.5g twice a week at opening of vagina     ezetimibe (ZETIA) 10 MG tablet Take 1 tablet (10 mg total) by mouth daily. 90  tablet 3   ferrous sulfate 300 (60 Fe) MG/5ML syrup Take 5 mLs (300 mg total) by mouth 2 (two) times daily with a meal. 300 mL 11   hydrOXYzine (ATARAX) 10 MG tablet Take 1 tablet (10 mg total) by mouth 3 (three) times daily as needed. 30 tablet 0   levothyroxine (SYNTHROID) 200 MCG tablet Please alternate 175 mcg and 200 mg dose tablet every other day 30 tablet 1   lisinopril-hydrochlorothiazide (ZESTORETIC) 20-25 MG tablet Take 0.5 tablets by mouth 3 (three) times a week. Take on Monday Wednesday and Friday only 90 tablet 1   nitroGLYCERIN (NITROSTAT) 0.4 MG SL tablet Place 1 tablet (0.4 mg total) under the tongue every 5 (five) minutes x 3 doses as needed for chest pain. 25 tablet 3   prochlorperazine (COMPAZINE) 10 MG tablet Take 1 tablet (10 mg total) by mouth every 6 (six) hours as needed for nausea or vomiting. 30 tablet 2   sucralfate (CARAFATE) 1 g tablet Take 1 tablet (1 g total) by mouth 4 (four) times daily -  with meals and at bedtime. Crush and dissolve in 10 mL of warm water, swallow prior to meals 120 tablet 1   Trospium Chloride 60 MG CP24 Take 1 capsule (60 mg total) by mouth daily. 30 capsule 11   No current facility-administered medications for this visit.    SURGICAL HISTORY:  Past Surgical History:  Procedure Laterality Date   ANTERIOR AND POSTERIOR REPAIR WITH SACROSPINOUS FIXATION N/A 12/20/2020   Procedure: SACROSPINOUS LIGAMENT FIXATION;  Surgeon: [name], MD;  Location: [facility];  Service: Gynecology;  Laterality: N/A;  Total time requested for all procedures is 2 hours   [place] age 5   lower   bartholin cyst removal  age 39's   BLADDER SUSPENSION N/A 12/20/2020   Procedure: TRANSVAGINAL TAPE (TVT) PROCEDURE;  Surgeon: [name], MD;  Location: [facility];  Service: Gynecology;  Laterality: N/A;   BRONCHIAL BRUSHINGS  07/15/2021   Procedure: BRONCHIAL BRUSHINGS;  Surgeon: Collene Gobble, MD;   Location: [facility] ENDOSCOPY;  Service: Pulmonary;;   BRONCHIAL NEEDLE ASPIRATION BIOPSY  07/15/2021   Procedure: BRONCHIAL NEEDLE ASPIRATION BIOPSIES;  Surgeon: Collene Gobble, MD;  Location: [facility] ENDOSCOPY;  Service: Pulmonary;;   CORONARY/GRAFT ACUTE MI REVASCULARIZATION N/A 10/26/2019   Procedure: Coronary/Graft Acute MI Revascularization;  Surgeon: Sherren Mocha, MD;  Location: [facility] CV LAB;  Service: Cardiovascular;  Laterality: N/A;   CYSTOCELE REPAIR N/A 12/20/2020   Procedure: ANTERIOR AND POSTERIOR REPAIR WITH PERINEORRHAPHY;  Surgeon: [name], MD;  Location: [facility];  Service: Gynecology;  Laterality: N/A;   CYSTOSCOPY N/A 12/20/2020  Procedure: CYSTOSCOPY;  Surgeon: [name], MD;  Location: [facility];  Service: Gynecology;  Laterality: N/A;   ELBOW SURGERY  1990's   left   ENDOBRONCHIAL ULTRASOUND N/A 07/15/2021   Procedure: ENDOBRONCHIAL ULTRASOUND;  Surgeon: Collene Gobble, MD;  Location: [facility] ENDOSCOPY;  Service: Pulmonary;  Laterality: N/A;   HEMOSTASIS CONTROL  07/15/2021   Procedure: HEMOSTASIS CONTROL;  Surgeon: Collene Gobble, MD;  Location: [facility] ENDOSCOPY;  Service: Pulmonary;;   LEFT HEART CATH AND CORONARY ANGIOGRAPHY N/A 10/26/2019   Procedure: LEFT HEART CATH AND CORONARY ANGIOGRAPHY;  Surgeon: Sherren Mocha, MD;  Location: [facility] CV LAB;  Service: Cardiovascular;  Laterality: N/A;   TONSILLECTOMY      REVIEW OF SYSTEMS:   Review of Systems  Constitutional: Negative for appetite change, chills, fatigue, fever and unexpected weight change.  HENT:   Negative for mouth sores, nosebleeds, sore throat and trouble swallowing.   Eyes: Negative for eye problems and icterus.  Respiratory: Negative for cough, hemoptysis, shortness of breath and wheezing.   Cardiovascular: Negative for chest pain and leg swelling.  Gastrointestinal: Negative for abdominal pain, constipation, diarrhea, nausea and vomiting.   Genitourinary: Negative for bladder incontinence, difficulty urinating, dysuria, frequency and hematuria.   Musculoskeletal: Negative for back pain, gait problem, neck pain and neck stiffness.  Skin: Negative for itching and rash.  Neurological: Negative for dizziness, extremity weakness, gait problem, headaches, light-headedness and seizures.  Hematological: Negative for adenopathy. Does not bruise/bleed easily.  Psychiatric/Behavioral: Negative for confusion, depression and sleep disturbance. The patient is not nervous/anxious.     PHYSICAL EXAMINATION:  There were no vitals taken for this visit.  ECOG PERFORMANCE STATUS: {CHL ONC ECOG [id number]  Physical Exam  Constitutional: Oriented to person, place, and time and well-developed, well-nourished, and in no distress. No distress.  HENT:  Head: Normocephalic and atraumatic.  Mouth/Throat: Oropharynx is clear and moist. No oropharyngeal exudate.  Eyes: Conjunctivae are normal. Right eye exhibits no discharge. Left eye exhibits no discharge. No scleral icterus.  Neck: Normal range of motion. Neck supple.  Cardiovascular: Normal rate, regular rhythm, normal heart sounds and intact distal pulses.   Pulmonary/Chest: Effort normal and breath sounds normal. No respiratory distress. No wheezes. No rales.  Abdominal: Soft. Bowel sounds are normal. Exhibits no distension and no mass. There is no tenderness.  Musculoskeletal: Normal range of motion. Exhibits no edema.  Lymphadenopathy:    No cervical adenopathy.  Neurological: Alert and oriented to person, place, and time. Exhibits normal muscle tone. Gait normal. Coordination normal.  Skin: Skin is warm and dry. No rash noted. Not diaphoretic. No erythema. No pallor.  Psychiatric: Mood, memory and judgment normal.  Vitals reviewed.  LABORATORY DATA: Lab Results  Component Value Date   WBC 7.0 01/15/2022   HGB 8.9 (L) 01/15/2022   HCT 28.9 (L) 01/15/2022   MCV 89.2 01/15/2022   PLT  312 01/15/2022      Chemistry      Component Value Date/Time   NA 137 01/15/2022 0908   NA 137 08/28/2021 0923   K 4.5 01/15/2022 0908   CL 104 01/15/2022 0908   CO2 26 01/15/2022 0908   BUN 27 (H) 01/15/2022 0908   BUN 20 08/28/2021 0923   CREATININE 1.50 (H) 02/10/2022 1000   CREATININE 1.24 (H) 01/15/2022 0908   CREATININE 1.08 04/18/2014 1516   GLU 104 12/23/2017 1111      Component Value Date/Time   CALCIUM 9.1 01/15/2022 0908   ALKPHOS  81 01/15/2022 0908   AST 12 (L) 01/15/2022 0908   ALT 10 01/15/2022 0908   BILITOT 0.2 (L) 01/15/2022 0908       RADIOGRAPHIC STUDIES:  CT CHEST WO CONTRAST  Result Date: 02/11/2022 CLINICAL DATA:  Primary Cancer Type: Lung Imaging Indication: Assess response to therapy Interval therapy since last imaging? Yes Initial Cancer Diagnosis Date: 07/15/2021; Established by: Biopsy-proven Detailed Pathology: Stage IIIA non-small cell lung cancer, adenocarcinoma. Primary Tumor location:  Left lower lobe. Surgeries: Coronary graft 2021. Chemotherapy: Yes; Ongoing? No; Most recent administration: 09/03/2021 Immunotherapy?  Yes; Type: Imfinzi; Ongoing? Yes Radiation therapy? Yes; Date Range: 08/06/2021 - 09/18/2021; Target: Left lung * Tracking Code: BO * EXAM: CT CHEST WITHOUT CONTRAST TECHNIQUE: Multidetector CT imaging of the chest was performed following the standard protocol without IV contrast. RADIATION DOSE REDUCTION: This exam was performed according to the departmental dose-optimization program which includes automated exposure control, adjustment of the mA and/or kV according to patient size and/or use of iterative reconstruction technique. COMPARISON:  Most recent CT chest 10/10/2021. 07/09/2021 PET-CT. FINDINGS: Cardiovascular: Calcified atheromatous plaque in the thoracic aorta with similar appearance. Heart size is stable. Small pericardial effusion is new from recent comparison imaging. Central pulmonary vasculature is of normal caliber.  Mediastinum/Nodes: No adenopathy in the chest. Segmental esophageal thickening similar to previous imaging. Lungs/Pleura: Small LEFT pleural effusion is new. Reduced size of LEFT lower lobe pulmonary nodule (image 43/5) 1.7 x 1.2 cm previously 2.5 x 2.1 cm. Mild thickening along the major fissure in the LEFT upper lobe and some ground-glass and mild septal thickening associated with this finding. No new discrete area of nodularity. Mild pulmonary emphysema. Major airways are patent. Upper Abdomen: Imaged portions the liver, pancreas, spleen, adrenal glands and kidneys are unremarkable. Musculoskeletal: No acute bone finding. No destructive bone process. Spinal degenerative changes. IMPRESSION: Continued decrease in size of the LEFT lower lobe pulmonary nodule. Ground-glass, septal thickening and mild thickening of the fissure in the LEFT chest in the LEFT upper lobe, favored to be related to post treatment changes. Small LEFT-sided effusion and very small pericardial effusion both new compared to recent imaging, potentially reactive. Suggest attention on follow-up. Signs of esophagitis with moderately large hiatal hernia as before. Aortic Atherosclerosis (ICD10-I70.0) and Emphysema (ICD10-J43.9). Electronically Signed   By: Zetta Bills M.D.   On: 02/11/2022 11:01     ASSESSMENT/PLAN:  This is a very pleasant 86 year old Caucasian female diagnosed with stage IIIa (T2a, N2, M0) non-small cell lung cancer, adenocarcinoma.  The patient presented with a left lower lobe lung mass in addition to left hilar mediastinal lymphadenopathy.  She was diagnosed in November 2022.  The patient did not have any actionable mutations by guardant 360 blood test.    The patient completed a course of concurrent chemotherapy with radiation.  Her chemotherapy is in the form of carboplatin for AUC of 2 and paclitaxel 45 Mg/M2 status post 6 cycles.  She has been tolerating her treatment with concurrent chemoradiation fairly well  except for the radiation-induced odynophagia and dysphagia.   Her scan showed significant improvement in her disease with 65% reduction in the volume of the left lower lobe nodule.  She continues to have persistent left hilar and infrahilar adenopathy.  The patient is currently undergoing treatment with consolidation immunotherapy with Imfinzi 1500 Mg IV every 4 weeks.  Status post 4 cycles.   The patient recently had a restaging CT scan performed.  Dr. Julien Nordmann had previously personally independently reviewed the scan.  I discussed the results with the patient today.  Which did not show any evidence of disease progression.  Recommend that she continue on the same treatment at the same dose.  She will proceed with cycle #5 today as scheduled.  We will see her back for follow-up visit in 4 weeks for evaluation and repeat blood work before starting cycle #6.  The patient was advised to call immediately if she has any concerning symptoms in the interval. The patient voices understanding of current disease status and treatment options and is in agreement with the current care plan. All questions were answered. The patient knows to call the clinic with any problems, questions or concerns. We can certainly see the patient much sooner if necessary             No orders of the defined types were placed in this encounter.    I spent {CHL ONC TIME VISIT - UORVI:[id number]} counseling the patient face to face. The total time spent in the appointment was {CHL ONC TIME VISIT - DYJWL:[id number]}.  Makita Blow L Tate Zagal, PA-C 02/11/22

## 2022-02-12 ENCOUNTER — Ambulatory Visit (INDEPENDENT_AMBULATORY_CARE_PROVIDER_SITE_OTHER): Payer: PPO | Admitting: Nurse Practitioner

## 2022-02-12 ENCOUNTER — Ambulatory Visit: Payer: PPO

## 2022-02-12 ENCOUNTER — Encounter: Payer: Self-pay | Admitting: Nurse Practitioner

## 2022-02-12 VITALS — BP 120/60 | HR 91 | Temp 98.5°F | Ht 66.0 in | Wt 159.0 lb

## 2022-02-12 DIAGNOSIS — E785 Hyperlipidemia, unspecified: Secondary | ICD-10-CM

## 2022-02-12 DIAGNOSIS — I129 Hypertensive chronic kidney disease with stage 1 through stage 4 chronic kidney disease, or unspecified chronic kidney disease: Secondary | ICD-10-CM

## 2022-02-12 DIAGNOSIS — N183 Chronic kidney disease, stage 3 unspecified: Secondary | ICD-10-CM | POA: Diagnosis not present

## 2022-02-12 DIAGNOSIS — J449 Chronic obstructive pulmonary disease, unspecified: Secondary | ICD-10-CM | POA: Diagnosis not present

## 2022-02-12 DIAGNOSIS — E039 Hypothyroidism, unspecified: Secondary | ICD-10-CM | POA: Diagnosis not present

## 2022-02-12 MED ORDER — BUDESONIDE-FORMOTEROL FUMARATE 80-4.5 MCG/ACT IN AERO: 2.0000 | INHALATION_SPRAY | Freq: Two times a day (BID) | RESPIRATORY_TRACT | 1 refills | Status: DC

## 2022-02-12 NOTE — Patient Instructions (Signed)
Hypertension, Adult High blood pressure (hypertension) is when the force of blood pumping through the arteries is too strong. The arteries are the blood vessels that carry blood from the heart throughout the body. Hypertension forces the heart to work harder to pump blood and may cause arteries to become narrow or stiff. Untreated or uncontrolled hypertension can lead to a heart attack, heart failure, a stroke, kidney disease, and other problems. A blood pressure reading consists of a higher number over a lower number. Ideally, your blood pressure should be below 120/80. The first ("top") number is called the systolic pressure. It is a measure of the pressure in your arteries as your heart beats. The second ("bottom") number is called the diastolic pressure. It is a measure of the pressure in your arteries as the heart relaxes. What are the causes? The exact cause of this condition is not known. There are some conditions that result in high blood pressure. What increases the risk? Certain factors may make you more likely to develop high blood pressure. Some of these risk factors are under your control, including: Smoking. Not getting enough exercise or physical activity. Being overweight. Having too much fat, sugar, calories, or salt (sodium) in your diet. Drinking too much alcohol. Other risk factors include: Having a personal history of heart disease, diabetes, high cholesterol, or kidney disease. Stress. Having a family history of high blood pressure and high cholesterol. Having obstructive sleep apnea. Age. The risk increases with age. What are the signs or symptoms? High blood pressure may not cause symptoms. Very high blood pressure (hypertensive crisis) may cause: Headache. Fast or irregular heartbeats (palpitations). Shortness of breath. Nosebleed. Nausea and vomiting. Vision changes. Severe chest pain, dizziness, and seizures. How is this diagnosed? This condition is diagnosed by  measuring your blood pressure while you are seated, with your arm resting on a flat surface, your legs uncrossed, and your feet flat on the floor. The cuff of the blood pressure monitor will be placed directly against the skin of your upper arm at the level of your heart. Blood pressure should be measured at least twice using the same arm. Certain conditions can cause a difference in blood pressure between your right and left arms. If you have a high blood pressure reading during one visit or you have normal blood pressure with other risk factors, you may be asked to: Return on a different day to have your blood pressure checked again. Monitor your blood pressure at home for 1 week or longer. If you are diagnosed with hypertension, you may have other blood or imaging tests to help your health care provider understand your overall risk for other conditions. How is this treated? This condition is treated by making healthy lifestyle changes, such as eating healthy foods, exercising more, and reducing your alcohol intake. You may be referred for counseling on a healthy diet and physical activity. Your health care provider may prescribe medicine if lifestyle changes are not enough to get your blood pressure under control and if: Your systolic blood pressure is above 130. Your diastolic blood pressure is above 80. Your personal target blood pressure may vary depending on your medical conditions, your age, and other factors. Follow these instructions at home: Eating and drinking  Eat a diet that is high in fiber and potassium, and low in sodium, added sugar, and fat. An example of this eating plan is called the DASH diet. DASH stands for Dietary Approaches to Stop Hypertension. To eat this way: Eat   plenty of fresh fruits and vegetables. Try to fill one half of your plate at each meal with fruits and vegetables. Eat whole grains, such as whole-wheat pasta, brown rice, or whole-grain bread. Fill about one  fourth of your plate with whole grains. Eat or drink low-fat dairy products, such as skim milk or low-fat yogurt. Avoid fatty cuts of meat, processed or cured meats, and poultry with skin. Fill about one fourth of your plate with lean proteins, such as fish, chicken without skin, beans, eggs, or tofu. Avoid pre-made and processed foods. These tend to be higher in sodium, added sugar, and fat. Reduce your daily sodium intake. Many people with hypertension should eat less than 1,500 mg of sodium a day. Do not drink alcohol if: Your health care provider tells you not to drink. You are pregnant, may be pregnant, or are planning to become pregnant. If you drink alcohol: Limit how much you have to: 0-1 drink a day for women. 0-2 drinks a day for men. Know how much alcohol is in your drink. In the [place] one drink equals one 12 oz bottle of beer (355 mL), one 5 oz glass of wine (148 mL), or one 1 oz glass of hard liquor (44 mL). Lifestyle  Work with your health care provider to maintain a healthy body weight or to lose weight. Ask what an ideal weight is for you. Get at least 30 minutes of exercise that causes your heart to beat faster (aerobic exercise) most days of the week. Activities may include walking, swimming, or biking. Include exercise to strengthen your muscles (resistance exercise), such as Pilates or lifting weights, as part of your weekly exercise routine. Try to do these types of exercises for 30 minutes at least 3 days a week. Do not use any products that contain nicotine or tobacco. These products include cigarettes, chewing tobacco, and vaping devices, such as e-cigarettes. If you need help quitting, ask your health care provider. Monitor your blood pressure at home as told by your health care provider. Keep all follow-up visits. This is important. Medicines Take over-the-counter and prescription medicines only as told by your health care provider. Follow directions carefully. Blood  pressure medicines must be taken as prescribed. Do not skip doses of blood pressure medicine. Doing this puts you at risk for problems and can make the medicine less effective. Ask your health care provider about side effects or reactions to medicines that you should watch for. Contact a health care provider if you: Think you are having a reaction to a medicine you are taking. Have headaches that keep coming back (recurring). Feel dizzy. Have swelling in your ankles. Have trouble with your vision. Get help right away if you: Develop a severe headache or confusion. Have unusual weakness or numbness. Feel faint. Have severe pain in your chest or abdomen. Vomit repeatedly. Have trouble breathing. These symptoms may be an emergency. Get help right away. Call 911. Do not wait to see if the symptoms will go away. Do not drive yourself to the hospital. Summary Hypertension is when the force of blood pumping through your arteries is too strong. If this condition is not controlled, it may put you at risk for serious complications. Your personal target blood pressure may vary depending on your medical conditions, your age, and other factors. For most people, a normal blood pressure is less than 120/80. Hypertension is treated with lifestyle changes, medicines, or a combination of both. Lifestyle changes include losing weight, eating a healthy,   low-sodium diet, exercising more, and limiting alcohol. This information is not intended to replace advice given to you by your health care provider. Make sure you discuss any questions you have with your health care provider. Document Revised: 06/18/2021 Document Reviewed: 06/18/2021 Elsevier Patient Education  2023 [employer]  

## 2022-02-12 NOTE — Progress Notes (Signed)
I,Tianna Badgett,acting as a Education administrator for [employer], FNP.,have documented all relevant documentation on the behalf of [name], FNP,as directed by  [name], FNP while in the presence of [name], [place].  This visit occurred during the SARS-CoV-2 public health emergency.  Safety protocols were in place, including screening questions prior to the visit, additional usage of staff PPE, and extensive cleaning of exam room while observing appropriate contact time as indicated for disinfecting solutions.  Subjective:     Patient ID: Abigail [name] , female    DOB: [birth year] , 86 y.o.   MRN: [id number]   Chief Complaint  Patient presents with   Hypertension    HPI  Patient presents today for a bp and thyroid. Continues follow up with Oncologist. She is now on 175 mcg and 200 mcg alternating and her hair has stopped shedding and does not feel depressed. She is due to have her blood drawn tomorrow. She is using an albuterol inhaler every 6 hours.   Hypertension This is a chronic problem. The current episode started more than 1 year ago. The problem is unchanged. The problem is controlled. Pertinent negatives include no anxiety, chest pain, headaches or palpitations. Risk factors for coronary artery disease include sedentary lifestyle and stress. Past treatments include diuretics and ACE inhibitors. Compliance problems include exercise.  Hypertensive end-organ damage includes kidney disease. There is no history of angina. Identifiable causes of hypertension include chronic renal disease and a thyroid problem.  Thyroid Problem Presents for follow-up visit. Patient reports no cold intolerance, constipation, fatigue, palpitations, tremors or weight loss. The symptoms have been stable.      Past Medical History:  Diagnosis Date   Anemia    Arthritis    knees, back   Carotid artery occlusion    Chronic kidney disease    stage 3 ckd no nephrologist   Complete uterine prolapse with prolapse  of anterior vaginal wall    Complication of anesthesia    hard to wake up per pt   Constipation    Coronary artery disease    Diverticulitis yrs ago coialitis   Dyspnea    History of blood transfusion    History of radiation therapy    right lung 08/05/2021-09/18/2021  Dr Gery Pray   Hypertension    Hypothyroid    Numbness    in hands at times   Pneumonia    Pre-diabetes    Scoliosis    STEMI (ST elevation myocardial infarction) ([facility]) 10/26/2019   DES RCA   Wears dentures    full dentures   Wears glasses    for reading     Family History  Problem Relation Age of Onset   Hypertension Mother      Current Outpatient Medications:    acetaminophen (TYLENOL) 325 MG tablet, Take 2 tablets (650 mg total) by mouth every 6 (six) hours as needed for mild pain (or Fever >/= 101)., Disp: , Rfl:    albuterol (VENTOLIN HFA) 108 (90 Base) MCG/ACT inhaler, TAKE 2 PUFFS BY MOUTH EVERY 6 HOURS AS NEEDED FOR WHEEZE OR SHORTNESS OF BREATH, Disp: 8.5 each, Rfl: 1   aspirin EC 81 MG tablet, Take 81 mg by mouth at bedtime., Disp: , Rfl:    atorvastatin (LIPITOR) 80 MG tablet, Take 1 tablet (80 mg total) by mouth daily at 6 PM., Disp: 90 tablet, Rfl: 3   Cholecalciferol (VITAMIN D3 PO), Take 2 capsules by mouth daily., Disp: , Rfl:    Cranberry 1000  MG CAPS, Take 1,000 mg by mouth daily at 12 noon., Disp: , Rfl:    cyclobenzaprine (FLEXERIL) 10 MG tablet, Take 1 tablet (10 mg total) by mouth 3 (three) times daily as needed for muscle spasms., Disp: 30 tablet, Rfl: 1   docusate sodium (COLACE) 100 MG capsule, Take 100-200 mg by mouth See admin instructions. Take 100 mg in the morning and 200 mg at bedtime, Disp: , Rfl:    esomeprazole (NEXIUM) 20 MG capsule, Take 1 capsule (20 mg total) by mouth 2 (two) times daily before a meal., Disp: 60 capsule, Rfl: 3   estradiol (ESTRACE) 0.1 MG/GM vaginal cream, Place 0.5 g vaginally once a week. Place 0.5g twice a week at opening of vagina, Disp: , Rfl:     ezetimibe (ZETIA) 10 MG tablet, Take 1 tablet (10 mg total) by mouth daily., Disp: 90 tablet, Rfl: 3   ferrous sulfate 300 (60 Fe) MG/5ML syrup, Take 5 mLs (300 mg total) by mouth 2 (two) times daily with a meal., Disp: 300 mL, Rfl: 11   hydrOXYzine (ATARAX) 10 MG tablet, Take 1 tablet (10 mg total) by mouth 3 (three) times daily as needed., Disp: 30 tablet, Rfl: 0   levothyroxine (SYNTHROID) 200 MCG tablet, Please alternate 175 mcg and 200 mg dose tablet every other day, Disp: 30 tablet, Rfl: 1   lisinopril-hydrochlorothiazide (ZESTORETIC) 20-25 MG tablet, Take 0.5 tablets by mouth 3 (three) times a week. Take on Monday Wednesday and Friday only, Disp: 90 tablet, Rfl: 1   nitroGLYCERIN (NITROSTAT) 0.4 MG SL tablet, Place 1 tablet (0.4 mg total) under the tongue every 5 (five) minutes x 3 doses as needed for chest pain., Disp: 25 tablet, Rfl: 3   prochlorperazine (COMPAZINE) 10 MG tablet, Take 1 tablet (10 mg total) by mouth every 6 (six) hours as needed for nausea or vomiting., Disp: 30 tablet, Rfl: 2   sucralfate (CARAFATE) 1 g tablet, Take 1 tablet (1 g total) by mouth 4 (four) times daily -  with meals and at bedtime. Crush and dissolve in 10 mL of warm water, swallow prior to meals, Disp: 120 tablet, Rfl: 1   Trospium Chloride 60 MG CP24, Take 1 capsule (60 mg total) by mouth daily., Disp: 30 capsule, Rfl: 11   Allergies  Allergen Reactions   Codeine Nausea And Vomiting   Norvasc [Amlodipine] Rash and Other (See Comments)    rash     Review of Systems  Constitutional: Negative.  Negative for fatigue and weight loss.  Respiratory: Negative.    Cardiovascular: Negative.  Negative for chest pain and palpitations.  Gastrointestinal: Negative.  Negative for constipation.  Endocrine: Negative for cold intolerance.  Neurological: Negative.  Negative for tremors and headaches.     Today's Vitals   02/12/22 1410  BP: 120/60  Pulse: 91  Temp: 98.5 F (36.9 C)  TempSrc: Oral  Weight: 159  lb (72.1 kg)  Height: 5\' 6"  (1.676 m)   Body mass index is 25.66 kg/m.  Wt Readings from Last 3 Encounters:  02/12/22 159 lb (72.1 kg)  02/05/22 159 lb (72.1 kg)  01/15/22 161 lb 2 oz (73.1 kg)    Objective:  Physical Exam Vitals reviewed.  Constitutional:      General: She is not in acute distress.    Appearance: Normal appearance.  Cardiovascular:     Rate and Rhythm: Normal rate and regular rhythm.     Pulses: Normal pulses.     Heart sounds: Normal heart sounds.  No murmur heard. Pulmonary:     Effort: Pulmonary effort is normal. No respiratory distress.     Breath sounds: Normal breath sounds. No wheezing.  Neurological:     General: No focal deficit present.     Mental Status: She is alert and oriented to person, place, and time.     Cranial Nerves: No cranial nerve deficit.     Motor: No weakness.  Psychiatric:        Mood and Affect: Mood normal.        Behavior: Behavior normal.        Thought Content: Thought content normal.        Judgment: Judgment normal.         Assessment And Plan:     1. Benign hypertension with chronic kidney disease, stage III ([place]) Comments: Blood pressure is well controlled, continue current medications  2. Acquired hypothyroidism Comments: Thyroid levels are improved after having a change to her levothyroxine.   3. Hyperlipidemia, unspecified hyperlipidemia type Comments: Continue statin, tolerating well   4. Chronic obstructive pulmonary disease, unspecified COPD type ([place]) Comments: Well controlled at this time.      Patient was given opportunity to ask questions. Patient verbalized understanding of the plan and was able to repeat key elements of the plan. All questions were answered to their satisfaction.  [name], FNP   I, [name], FNP, have reviewed all documentation for this visit. The documentation on 02/12/22 for the exam, diagnosis, procedures, and orders are all accurate and complete.   IF YOU HAVE  BEEN REFERRED TO A SPECIALIST, IT MAY TAKE 1-2 WEEKS TO SCHEDULE/PROCESS THE REFERRAL. IF YOU HAVE NOT HEARD FROM US/SPECIALIST IN TWO WEEKS, PLEASE GIVE [place] A CALL AT [phone] X [phone].   THE PATIENT IS ENCOURAGED TO PRACTICE SOCIAL DISTANCING DUE TO THE COVID-19 PANDEMIC.

## 2022-02-13 ENCOUNTER — Inpatient Hospital Stay: Payer: PPO

## 2022-02-13 ENCOUNTER — Other Ambulatory Visit: Payer: Self-pay

## 2022-02-13 ENCOUNTER — Encounter: Payer: Self-pay | Admitting: Physician Assistant

## 2022-02-13 ENCOUNTER — Inpatient Hospital Stay: Payer: PPO | Attending: Internal Medicine | Admitting: Physician Assistant

## 2022-02-13 VITALS — BP 147/56 | HR 79 | Temp 97.4°F | Resp 17 | Wt 160.5 lb

## 2022-02-13 VITALS — BP 144/52 | HR 72 | Temp 98.1°F | Resp 18

## 2022-02-13 DIAGNOSIS — C3432 Malignant neoplasm of lower lobe, left bronchus or lung: Secondary | ICD-10-CM | POA: Insufficient documentation

## 2022-02-13 DIAGNOSIS — C3490 Malignant neoplasm of unspecified part of unspecified bronchus or lung: Secondary | ICD-10-CM

## 2022-02-13 DIAGNOSIS — D649 Anemia, unspecified: Secondary | ICD-10-CM | POA: Diagnosis not present

## 2022-02-13 DIAGNOSIS — I3139 Other pericardial effusion (noninflammatory): Secondary | ICD-10-CM | POA: Insufficient documentation

## 2022-02-13 DIAGNOSIS — J90 Pleural effusion, not elsewhere classified: Secondary | ICD-10-CM | POA: Insufficient documentation

## 2022-02-13 DIAGNOSIS — Z5112 Encounter for antineoplastic immunotherapy: Secondary | ICD-10-CM | POA: Insufficient documentation

## 2022-02-13 DIAGNOSIS — Z79899 Other long term (current) drug therapy: Secondary | ICD-10-CM | POA: Diagnosis not present

## 2022-02-13 LAB — CBC WITH DIFFERENTIAL (CANCER CENTER ONLY)
Abs Immature Granulocytes: 0.01 10*3/uL (ref 0.00–0.07)
Basophils Absolute: 0 10*3/uL (ref 0.0–0.1)
Basophils Relative: 1 %
Eosinophils Absolute: 0.1 10*3/uL (ref 0.0–0.5)
Eosinophils Relative: 3 %
HCT: 27.9 % — ABNORMAL LOW (ref 36.0–46.0)
Hemoglobin: 8.1 g/dL — ABNORMAL LOW (ref 12.0–15.0)
Immature Granulocytes: 0 %
Lymphocytes Relative: 6 %
Lymphs Abs: 0.3 10*3/uL — ABNORMAL LOW (ref 0.7–4.0)
MCH: 26 pg (ref 26.0–34.0)
MCHC: 29 g/dL — ABNORMAL LOW (ref 30.0–36.0)
MCV: 89.7 fL (ref 80.0–100.0)
Monocytes Absolute: 0.4 10*3/uL (ref 0.1–1.0)
Monocytes Relative: 9 %
Neutro Abs: 3.7 10*3/uL (ref 1.7–7.7)
Neutrophils Relative %: 81 %
Platelet Count: 255 10*3/uL (ref 150–400)
RBC: 3.11 MIL/uL — ABNORMAL LOW (ref 3.87–5.11)
RDW: 21.1 % — ABNORMAL HIGH (ref 11.5–15.5)
WBC Count: 4.5 10*3/uL (ref 4.0–10.5)
nRBC: 0 % (ref 0.0–0.2)

## 2022-02-13 LAB — CMP (CANCER CENTER ONLY)
ALT: 7 U/L (ref 0–44)
AST: 11 U/L — ABNORMAL LOW (ref 15–41)
Albumin: 3.9 g/dL (ref 3.5–5.0)
Alkaline Phosphatase: 73 U/L (ref 38–126)
Anion gap: 8 (ref 5–15)
BUN: 25 mg/dL — ABNORMAL HIGH (ref 8–23)
CO2: 26 mmol/L (ref 22–32)
Calcium: 9.3 mg/dL (ref 8.9–10.3)
Chloride: 106 mmol/L (ref 98–111)
Creatinine: 1.31 mg/dL — ABNORMAL HIGH (ref 0.44–1.00)
GFR, Estimated: 40 mL/min — ABNORMAL LOW (ref >60)
Glucose, Bld: 141 mg/dL — ABNORMAL HIGH (ref 70–99)
Potassium: 4 mmol/L (ref 3.5–5.1)
Sodium: 140 mmol/L (ref 135–145)
Total Bilirubin: 0.3 mg/dL (ref 0.3–1.2)
Total Protein: 7.2 g/dL (ref 6.5–8.1)

## 2022-02-13 LAB — SAMPLE TO BLOOD BANK

## 2022-02-13 LAB — TSH: TSH: 0.29 u[IU]/mL — ABNORMAL LOW (ref 0.350–4.500)

## 2022-02-13 LAB — PREPARE RBC (CROSSMATCH)

## 2022-02-13 MED ORDER — ACETAMINOPHEN 325 MG PO TABS
650.0000 mg | ORAL_TABLET | Freq: Once | ORAL | Status: AC
  Administered 2022-02-13: 650 mg via ORAL
  Filled 2022-02-13: qty 2

## 2022-02-13 MED ORDER — SODIUM CHLORIDE 0.9 % IV SOLN
1500.0000 mg | Freq: Once | INTRAVENOUS | Status: AC
  Administered 2022-02-13: 1500 mg via INTRAVENOUS
  Filled 2022-02-13: qty 30

## 2022-02-13 MED ORDER — DIPHENHYDRAMINE HCL 25 MG PO CAPS
25.0000 mg | ORAL_CAPSULE | Freq: Once | ORAL | Status: AC
  Administered 2022-02-13: 25 mg via ORAL
  Filled 2022-02-13: qty 1

## 2022-02-13 MED ORDER — SODIUM CHLORIDE 0.9% IV SOLUTION
250.0000 mL | Freq: Once | INTRAVENOUS | Status: AC
  Administered 2022-02-13: 250 mL via INTRAVENOUS

## 2022-02-13 MED ORDER — SODIUM CHLORIDE 0.9 % IV SOLN: Freq: Once | INTRAVENOUS | Status: AC

## 2022-02-13 NOTE — Progress Notes (Signed)
SATURATION QUALIFICATIONS: (This note is used to comply with regulatory documentation for home oxygen)  Patient Saturations on Room Air at Rest = 90 %  Patient Saturations on Room Air while Ambulating = 85 %  Patient Saturations on 3 Liters of oxygen while Ambulating = 97%  Please briefly explain why patient needs home oxygen: No other means available to increase oxygen saturation.  Oxygen ordered from [facility] on parachute.

## 2022-02-13 NOTE — Progress Notes (Signed)
Per Cassie, PA, proceed with treatment today with current labs.  Pt to receive 1unit PRBC following therapy today

## 2022-02-13 NOTE — Patient Instructions (Signed)
[facility]  Discharge Instructions: Thank you for choosing [facility] to provide your oncology and hematology care.   If you have a lab appointment with the [facility], please go directly to the [facility] and check in at the registration area.   Wear comfortable clothing and clothing appropriate for easy access to any Portacath or PICC line.   We strive to give you quality time with your provider. You may need to reschedule your appointment if you arrive late (15 or more minutes).  Arriving late affects you and other patients whose appointments are after yours.  Also, if you miss three or more appointments without notifying the office, you may be dismissed from the clinic at the provider's discretion.      For prescription refill requests, have your pharmacy contact our office and allow 72 hours for refills to be completed.    Today you received the following chemotherapy and/or immunotherapy agents: Durvalumab.       To help prevent nausea and vomiting after your treatment, we encourage you to take your nausea medication as directed.  BELOW ARE SYMPTOMS THAT SHOULD BE REPORTED IMMEDIATELY: *FEVER GREATER THAN 100.4 F (38 C) OR HIGHER *CHILLS OR SWEATING *NAUSEA AND VOMITING THAT IS NOT CONTROLLED WITH YOUR NAUSEA MEDICATION *UNUSUAL SHORTNESS OF BREATH *UNUSUAL BRUISING OR BLEEDING *URINARY PROBLEMS (pain or burning when urinating, or frequent urination) *BOWEL PROBLEMS (unusual diarrhea, constipation, pain near the anus) TENDERNESS IN MOUTH AND THROAT WITH OR WITHOUT PRESENCE OF ULCERS (sore throat, sores in mouth, or a toothache) UNUSUAL RASH, SWELLING OR PAIN  UNUSUAL VAGINAL DISCHARGE OR ITCHING   Items with * indicate a potential emergency and should be followed up as soon as possible or go to the Emergency Department if any problems should occur.  Please show the CHEMOTHERAPY ALERT CARD or IMMUNOTHERAPY ALERT CARD at check-in  to the Emergency Department and triage nurse.  Should you have questions after your visit or need to cancel or reschedule your appointment, please contact [facility]  Dept: [phone]  and follow the prompts.  Office hours are 8:00 a.m. to 4:30 p.m. Monday - Friday. Please note that voicemails left after 4:00 p.m. may not be returned until the following business day.  We are closed weekends and major holidays. You have access to a nurse at all times for urgent questions. Please call the main number to the clinic Dept: [phone] and follow the prompts.   For any non-urgent questions, you may also contact your provider using MyChart. We now offer e-Visits for anyone 75 and older to request care online for non-urgent symptoms. For details visit [email or web address].   Also download the MyChart app! Go to the app store, search "MyChart", open the app, select [facility], and log in with your MyChart username and password.  Masks are optional in the cancer centers. If you would like for your care team to wear a mask while they are taking care of you, please let them know. For doctor visits, patients may have with them one support person who is at least 84 years old. At this time, visitors are not allowed in the infusion area. Blood Transfusion, Adult, Care After This sheet gives you information about how to care for yourself after your procedure. Your health care provider may also give you more specific instructions. If you have problems or questions, contact your health care provider. What can I expect after the procedure? After the  procedure, it is common to have: Bruising and soreness where the IV was inserted. A headache. Follow these instructions at home: IV insertion site care     Follow instructions from your health care provider about how to take care of your IV insertion site. Make sure you: Wash your hands with soap and water before and after you  change your bandage (dressing). If soap and water are not available, use hand sanitizer. Change your dressing as told by your health care provider. Check your IV insertion site every day for signs of infection. Check for: Redness, swelling, or pain. Bleeding from the site. Warmth. Pus or a bad smell. General instructions Take over-the-counter and prescription medicines only as told by your health care provider. Rest as told by your health care provider. Return to your normal activities as told by your health care provider. Keep all follow-up visits as told by your health care provider. This is important. Contact a health care provider if: You have itching or red, swollen areas of skin (hives). You feel anxious. You feel weak after doing your normal activities. You have redness, swelling, warmth, or pain around the IV insertion site. You have blood coming from the IV insertion site that does not stop with pressure. You have pus or a bad smell coming from your IV insertion site. Get help right away if: You have symptoms of a serious allergic or immune system reaction, including: Trouble breathing or shortness of breath. Swelling of the face or feeling flushed. Fever or chills. Pain in the head, back, or chest. Dark urine or blood in the urine. Widespread rash. Fast heartbeat. Feeling dizzy or light-headed. If you receive your blood transfusion in an outpatient setting, you will be told whom to contact to report any reactions. These symptoms may represent a serious problem that is an emergency. Do not wait to see if the symptoms will go away. Get medical help right away. Call your local emergency services (911 in the [place] Do not drive yourself to the hospital. Summary Bruising and tenderness around the IV insertion site are common. Check your IV insertion site every day for signs of infection. Rest as told by your health care provider. Return to your normal activities as told by your  health care provider. Get help right away for symptoms of a serious allergic or immune system reaction to blood transfusion. This information is not intended to replace advice given to you by your health care provider. Make sure you discuss any questions you have with your health care provider. Document Revised: 12/06/2020 Document Reviewed: 02/03/2019 Elsevier Patient Education  [place].

## 2022-02-14 ENCOUNTER — Telehealth: Payer: PPO

## 2022-02-14 ENCOUNTER — Telehealth: Payer: Self-pay

## 2022-02-14 ENCOUNTER — Ambulatory Visit (INDEPENDENT_AMBULATORY_CARE_PROVIDER_SITE_OTHER): Payer: PPO

## 2022-02-14 DIAGNOSIS — R7303 Prediabetes: Secondary | ICD-10-CM

## 2022-02-14 DIAGNOSIS — R918 Other nonspecific abnormal finding of lung field: Secondary | ICD-10-CM

## 2022-02-14 DIAGNOSIS — E039 Hypothyroidism, unspecified: Secondary | ICD-10-CM

## 2022-02-14 DIAGNOSIS — F419 Anxiety disorder, unspecified: Secondary | ICD-10-CM

## 2022-02-14 DIAGNOSIS — D649 Anemia, unspecified: Secondary | ICD-10-CM

## 2022-02-14 DIAGNOSIS — I10 Essential (primary) hypertension: Secondary | ICD-10-CM

## 2022-02-14 DIAGNOSIS — E785 Hyperlipidemia, unspecified: Secondary | ICD-10-CM

## 2022-02-14 LAB — TYPE AND SCREEN
ABO/RH(D): B POS
Antibody Screen: NEGATIVE
Unit division: 0

## 2022-02-14 LAB — BPAM RBC
Blood Product Expiration Date: 202307132359
ISSUE DATE / TIME: 202306221320
Unit Type and Rh: 7300

## 2022-02-14 NOTE — Chronic Care Management (AMB) (Signed)
Chronic Care Management   CCM RN Visit Note  02/14/2022 Name: Abigail [name] MRN: [id number] DOB: 1936/07/08  Subjective: Abigail [name] is a 86 y.o. year old female who is a primary care patient of [name], [place]. The care management team was consulted for assistance with disease management and care coordination needs.    Engaged with patient by telephone for follow up visit in response to provider referral for case management and/or care coordination services.   Consent to Services:  The patient was given information about Chronic Care Management services, agreed to services, and gave verbal consent prior to initiation of services.  Please see initial visit note for detailed documentation.   Patient agreed to services and verbal consent obtained.   Assessment: Review of patient past medical history, allergies, medications, health status, including review of consultants reports, laboratory and other test data, was performed as part of comprehensive evaluation and provision of chronic care management services.   SDOH (Social Determinants of Health) assessments and interventions performed:  Yes, SW referral sent for home delivered meals  CCM Care Plan  Allergies  Allergen Reactions   Codeine Nausea And Vomiting   Norvasc [Amlodipine] Rash and Other (See Comments)    rash    Outpatient Encounter Medications as of 02/14/2022  Medication Sig Note   albuterol (VENTOLIN HFA) 108 (90 Base) MCG/ACT inhaler TAKE 2 PUFFS BY MOUTH EVERY 6 HOURS AS NEEDED FOR WHEEZE OR SHORTNESS OF BREATH    budesonide-formoterol (SYMBICORT) 80-4.5 MCG/ACT inhaler Inhale 2 puffs into the lungs 2 (two) times daily.    acetaminophen (TYLENOL) 325 MG tablet Take 2 tablets (650 mg total) by mouth every 6 (six) hours as needed for mild pain (or Fever >/= 101). (Patient not taking: Reported on 02/13/2022)    aspirin EC 81 MG tablet Take 81 mg by mouth at bedtime.    atorvastatin (LIPITOR) 80 MG tablet Take 1 tablet  (80 mg total) by mouth daily at 6 PM.    Cholecalciferol (VITAMIN D3 PO) Take 2 capsules by mouth daily. (Patient not taking: Reported on 02/13/2022)    Cranberry 1000 MG CAPS Take 1,000 mg by mouth daily at 12 noon.    cyclobenzaprine (FLEXERIL) 10 MG tablet Take 1 tablet (10 mg total) by mouth 3 (three) times daily as needed for muscle spasms.    docusate sodium (COLACE) 100 MG capsule Take 100-200 mg by mouth See admin instructions. Take 100 mg in the morning and 200 mg at bedtime    esomeprazole (NEXIUM) 20 MG capsule Take 1 capsule (20 mg total) by mouth 2 (two) times daily before a meal. (Patient not taking: Reported on 02/13/2022)    estradiol (ESTRACE) 0.1 MG/GM vaginal cream Place 0.5 g vaginally once a week. Place 0.5g twice a week at opening of vagina    ezetimibe (ZETIA) 10 MG tablet Take 1 tablet (10 mg total) by mouth daily.    ferrous sulfate 300 (60 Fe) MG/5ML syrup Take 5 mLs (300 mg total) by mouth 2 (two) times daily with a meal.    hydrOXYzine (ATARAX) 10 MG tablet Take 1 tablet (10 mg total) by mouth 3 (three) times daily as needed. (Patient not taking: Reported on 02/13/2022)    levothyroxine (SYNTHROID) 200 MCG tablet Please alternate 175 mcg and 200 mg dose tablet every other day    lisinopril-hydrochlorothiazide (ZESTORETIC) 20-25 MG tablet Take 0.5 tablets by mouth 3 (three) times a week. Take on Monday Wednesday and Friday only  nitroGLYCERIN (NITROSTAT) 0.4 MG SL tablet Place 1 tablet (0.4 mg total) under the tongue every 5 (five) minutes x 3 doses as needed for chest pain. (Patient not taking: Reported on 02/13/2022)    prochlorperazine (COMPAZINE) 10 MG tablet Take 1 tablet (10 mg total) by mouth every 6 (six) hours as needed for nausea or vomiting. (Patient not taking: Reported on 02/13/2022) 02/13/2022: Last dose 1 month ago   sucralfate (CARAFATE) 1 g tablet Take 1 tablet (1 g total) by mouth 4 (four) times daily -  with meals and at bedtime. Crush and dissolve in 10 mL of  warm water, swallow prior to meals (Patient not taking: Reported on 02/13/2022)    Trospium Chloride 60 MG CP24 Take 1 capsule (60 mg total) by mouth daily. 02/13/2022: For OABladder   No facility-administered encounter medications on file as of 02/14/2022.    Patient Active Problem List   Diagnosis Date Noted   Anemia 02/13/2022   Encounter for antineoplastic immunotherapy 11/20/2021   COPD (chronic obstructive pulmonary disease) ([place]) 08/20/2021   Dysphagia 08/20/2021   History of lung cancer 07/31/2021   Malignant neoplasm of unspecified part of unspecified bronchus or lung ([place]) 07/25/2021   Encounter for antineoplastic chemotherapy 07/25/2021   Mediastinal adenopathy 07/15/2021   Pulmonary nodule 1 cm or greater in diameter 07/02/2021   Coronary artery disease 04/11/2021   Right bundle branch block 04/11/2021   Newly recognized heart murmur 04/11/2021   Occult blood positive stool 11/09/2020   Chest pain with moderate risk for cardiac etiology 12/01/2019   Urinary urgency 12/01/2019   Anxiety 02/09/2019   Prediabetes 12/29/2018   Essential hypertension 08/27/2018   Vitamin D deficiency 06/25/2018   Hyperlipidemia 06/25/2018   Hypothyroidism 06/25/2018   Benign hypertension with CKD (chronic kidney disease) stage III ([place]) 06/25/2018   Chronic kidney disease, stage III (moderate) ([place]) 06/25/2018   OAB (overactive bladder) 08/26/2016   CAP (community acquired pneumonia) 04/12/2012   Bacteremia 04/08/2012   LLQ abdominal pain 04/05/2012   Hyponatremia 04/05/2012    Conditions to be addressed/monitored: Acquired hypothyroidism, Anxiety, Essential hypertension, Prediabetes, Hyperlipidemia, Anemia, Lung Mass  Care Plan : RN Care Manager Plan of Care  Updates made by Lynne Logan, RN since 02/14/2022 12:00 AM     Problem: No plan of care established for management of chronic disease states (Acquired hypothyroidism, Anxiety, Essential hypertension, Prediabetes,  Hyperlipidemia, Anemia, Lung Mass)   Priority: High     Long-Range Goal: Establishment of plan of care for management of chronic disease states (Acquired hypothyroidism, Anxiety, Essential hypertension, Prediabetes, Hyperlipidemia, Anemia, Lung Mass)   Start Date: 10/30/2021  Expected End Date: 10/31/2022  Recent Progress: On track  Priority: High  Note:   Current Barriers:  Knowledge Deficits related to plan of care for management of Acquired hypothyroidism, Anxiety, Essential hypertension, Prediabetes, Hyperlipidemia, Anemia, Lung Mass  Care Coordination needs related to Acquired hypothyroidism, Anxiety, Essential hypertension, Prediabetes, Hyperlipidemia, Anemia, Lung Mass  RNCM Clinical Goal(s):  Patient will verbalize basic understanding of  Acquired hypothyroidism, Anxiety, Essential hypertension, Prediabetes, Hyperlipidemia, Anemia, Lung Mass disease process and self health management plan as evidenced by patient will report having no disease exacerbations related to her chronic disease states as listed above  take all medications exactly as prescribed and will call provider for medication related questions as evidenced by patient will demonstrate improved understanding of prescribed medications and rationale for usage as evidenced by patient teach back demonstrate Improved health management independence as evidenced by patient will  report 100% adherence to her prescribed treatment plan  continue to work with RN Care Manager to address care management and care coordination needs related to  Acquired hypothyroidism, Anxiety, Essential hypertension, Prediabetes, Hyperlipidemia, Anemia, Lung Mass as evidenced by adherence to CM Team Scheduled appointments demonstrate ongoing self health care management ability  as evidenced by  through collaboration with RN Care manager, provider, and care team.   Interventions: 1:1 collaboration with primary care provider regarding development and update of  comprehensive plan of care as evidenced by provider attestation and co-signature Inter-disciplinary care team collaboration (see longitudinal plan of care) Evaluation of current treatment plan related to  self management and patient's adherence to plan as established by provider  Oncology:  (Status: Goal on track:  Yes.) Long Term Goal Assessment of understanding of oncology diagnosis: Malignant neoplasm of unspecified part of unspecified bronchus or lung Assessed patient understanding of cancer diagnosis and recommended treatment plan, Reviewed upcoming provider appointments and treatment appointments, Assessed available transportation to appointments and treatments. Has consistent/reliable transportation: Yes, Assessed support system. Has consistent/reliable family or other support: Yes, and Nutrition assessment performed Educated patient about access to the embedded BSW for SODH needs, patient declines at this time Assessed for SE related to Cancer treatment Determined patient is experiencing severe fatigue and depression, patient declines wanting Psych referral and or pharmacological treatment at this time  Educated patient on energy conservation and encouraged patient to balance her activity with rest Educated on the importance to stay well hydrated with water and to try establishing a light exercise routine to help promote energy by building endurance and strength and help maintain muscle mass Educated patient on the importance to eat well balanced meals, and discussed eating small frequent meals verses 3 square meals per day Mailed patient printed educational materials related to getting a full body workout with chair exerices Educated patient on importance of adding vitamin D rich foods to her diet and trying to get 15 minutes of natural sunlight when possible Encouraged patient to ask her Cancer team about checking her Vitamin D level at next lab draw Educated patient on the role of Vitamin  D and potential complications if deficient   02/14/22 completed telephonic follow up with patient  Determined patient is having Oxygen desaturations when ambulating in her home in the mid 80's Determined patient discussed with Oncology PA Cassandra who ordered supplemental Oxygen Determined patient has not received nor heard from the DME company supplying the Oxygen and needs assistance with follow up  Placed outbound call to [place], confirmed the order for Oxygen has been received, however insurance member ID is missing, provided the information requested for patients HTA plan, in addition, faxed a copy of patient's insurance card as directed by office staff to fax # [phone] Requested this DME be expedited for delivery today due to urgent need, confirmed [facility] has the correct phone number for patient, [facility] rep advised someone will contact Ms. Teed once approved to schedule a delivery time with her Contacted patient to advise of the DME status concerning her Oxygen, patient verbalizes understanding Reviewed medications with patient and discussed importance of medication adherence, educated patient on proper use of her Albuterol inhaler and received benefits  Discussed patient will notify this RN CM on next business day if she does not receive her Oxygen Discussed plans with patient for ongoing care management follow up and provided patient with direct contact information for care management team Reviewed scheduled/upcoming provider appointment including: next PCP follow  up appointment scheduled for 03/27/22 @12  PM   Anemia/Bleeding Interventions:  (Status:  Goal on track:  Yes.) Long Term Goal  Assessment of understanding of anemia disorder diagnosis  Basic overview and discussion of anemia disorder or acute disease state  Medications reviewed  Review of patient status, including review of consultant's reports, relevant laboratory and other test results, and medications  completed Reviewed and discussed patient received 1 packed unit of RBC's yesterday on 02/14/22, infusion completed without adverse event, discussed Oncology will continue to monitor and treat Discussed patient feels more energetic today, she denies new symptoms or concerns  Component Ref Range & Units 1 mo ago (01/15/22) 1 mo ago (12/18/21) 2 mo ago (11/20/21) 3 mo ago (10/23/21)     WBC Count 4.0 - 10.5 K/uL 7.0  5.6  5.3  5.1      RBC 3.87 - 5.11 MIL/uL 3.24 Low   3.27 Low   3.19 Low   2.46 Low       Hemoglobin 12.0 - 15.0 g/dL 8.9 Low   9.5 Low   9.9 Low   7.7 Low       HCT 36.0 - 46.0 % 28.9 Low   31.2 Low   31.9 Low   24.2 Low        Depression Interventions:  (Status:  Condition stable.  Not addressed this visit.)  Long Term Goal Evaluation of current treatment plan related to Depression,  self-management and patient's adherence to plan as established by provider Determined patient feels she is coping with her new Cancer diagnosis at this time, she admits the initial diagnosis and treatment was very difficult Educated on benefits of using meditation, deep breathing and routine exercise to help with effective coping Determined patient's son Nicole Kindred is residing with her and supportive, however he has limited availability due to the demands of his job Educated patient regarding access to the embedded BSW and encouraged patient to notify this RN CM if a referral for SDOH needs arise Instructed patient to contact her PCP and or Oncology team for persistent or worsening symptoms of depression  Discussed plans with patient for ongoing care management follow up and provided patient with direct contact information for care management team   Patient Goals/Self-Care Activities: Take all medications as prescribed Attend all scheduled provider appointments Call pharmacy for medication refills 3-7 days in advance of running out of medications Perform all self care activities independently  Call provider  office for new concerns or questions  Establish a exercise routine  Ask you cancer team to check your vitamin d level at next lab draw Balance your activity with rest Stay well hydrated with water and try to eat well balanced small frequent meals throughout the day Notify this RN CM if you do not receive your supplemental Oxygen from [facility]   Follow Up Plan:  Telephone follow up appointment with care management team member scheduled for:  04/14/22     Barb Merino, RN, BSN, CCM Care Management Coordinator [facility]  Direct Phone: [phone]

## 2022-02-17 ENCOUNTER — Ambulatory Visit: Payer: PPO

## 2022-02-17 DIAGNOSIS — F419 Anxiety disorder, unspecified: Secondary | ICD-10-CM

## 2022-02-17 DIAGNOSIS — E785 Hyperlipidemia, unspecified: Secondary | ICD-10-CM

## 2022-02-17 DIAGNOSIS — C3490 Malignant neoplasm of unspecified part of unspecified bronchus or lung: Secondary | ICD-10-CM | POA: Diagnosis not present

## 2022-02-17 DIAGNOSIS — R918 Other nonspecific abnormal finding of lung field: Secondary | ICD-10-CM

## 2022-02-17 DIAGNOSIS — I10 Essential (primary) hypertension: Secondary | ICD-10-CM

## 2022-02-17 DIAGNOSIS — R7303 Prediabetes: Secondary | ICD-10-CM

## 2022-02-17 DIAGNOSIS — E039 Hypothyroidism, unspecified: Secondary | ICD-10-CM

## 2022-02-17 DIAGNOSIS — D649 Anemia, unspecified: Secondary | ICD-10-CM

## 2022-02-17 NOTE — Chronic Care Management (AMB) (Signed)
Chronic Care Management    Social Work Note  02/17/2022 Name: MARYSE BRIERLEY MRN: [id number] DOB: March 04, 1936  JAYLEEN SCAGLIONE is a 86 y.o. year old female who is a primary care patient of [name], [place]. The CCM team was consulted to assist the patient with chronic disease management and/or care coordination needs related to:  Acquired Hypothyroidism, Anxiety, Essential HTN, Hyperlipidemia, Anemia, Lung Mass, Prediabetes .   Engaged with patient by telephone for follow up visit in response to provider referral for social work chronic care management and care coordination services.   Consent to Services:  The patient was given information about Chronic Care Management services, agreed to services, and gave verbal consent prior to initiation of services.  Please see initial visit note for detailed documentation.   Patient agreed to services and consent obtained.   Assessment: Review of patient past medical history, allergies, medications, and health status, including review of relevant consultants reports was performed today as part of a comprehensive evaluation and provision of chronic care management and care coordination services.     SDOH (Social Determinants of Health) assessments and interventions performed:  SDOH Interventions    Flowsheet Row Most Recent Value  SDOH Interventions   Food Insecurity Interventions [id number] Referral  [referral for meals on wheels]  Housing Interventions Intervention Not Indicated  Transportation Interventions Intervention Not Indicated        Advanced Directives Status: Not addressed in this encounter.  CCM Care Plan  Allergies  Allergen Reactions   Codeine Nausea And Vomiting   Norvasc [Amlodipine] Rash and Other (See Comments)    rash    Outpatient Encounter Medications as of 02/17/2022  Medication Sig Note   acetaminophen (TYLENOL) 325 MG tablet Take 2 tablets (650 mg total) by mouth every 6 (six) hours as needed for mild pain (or Fever  >/= 101). (Patient not taking: Reported on 02/13/2022)    albuterol (VENTOLIN HFA) 108 (90 Base) MCG/ACT inhaler TAKE 2 PUFFS BY MOUTH EVERY 6 HOURS AS NEEDED FOR WHEEZE OR SHORTNESS OF BREATH    aspirin EC 81 MG tablet Take 81 mg by mouth at bedtime.    atorvastatin (LIPITOR) 80 MG tablet Take 1 tablet (80 mg total) by mouth daily at 6 PM.    budesonide-formoterol (SYMBICORT) 80-4.5 MCG/ACT inhaler Inhale 2 puffs into the lungs 2 (two) times daily.    Cholecalciferol (VITAMIN D3 PO) Take 2 capsules by mouth daily. (Patient not taking: Reported on 02/13/2022)    Cranberry 1000 MG CAPS Take 1,000 mg by mouth daily at 12 noon.    cyclobenzaprine (FLEXERIL) 10 MG tablet Take 1 tablet (10 mg total) by mouth 3 (three) times daily as needed for muscle spasms.    docusate sodium (COLACE) 100 MG capsule Take 100-200 mg by mouth See admin instructions. Take 100 mg in the morning and 200 mg at bedtime    esomeprazole (NEXIUM) 20 MG capsule Take 1 capsule (20 mg total) by mouth 2 (two) times daily before a meal. (Patient not taking: Reported on 02/13/2022)    estradiol (ESTRACE) 0.1 MG/GM vaginal cream Place 0.5 g vaginally once a week. Place 0.5g twice a week at opening of vagina    ezetimibe (ZETIA) 10 MG tablet Take 1 tablet (10 mg total) by mouth daily.    ferrous sulfate 300 (60 Fe) MG/5ML syrup Take 5 mLs (300 mg total) by mouth 2 (two) times daily with a meal.    hydrOXYzine (ATARAX) 10 MG tablet Take 1 tablet (  10 mg total) by mouth 3 (three) times daily as needed. (Patient not taking: Reported on 02/13/2022)    levothyroxine (SYNTHROID) 200 MCG tablet Please alternate 175 mcg and 200 mg dose tablet every other day    lisinopril-hydrochlorothiazide (ZESTORETIC) 20-25 MG tablet Take 0.5 tablets by mouth 3 (three) times a week. Take on Monday Wednesday and Friday only    nitroGLYCERIN (NITROSTAT) 0.4 MG SL tablet Place 1 tablet (0.4 mg total) under the tongue every 5 (five) minutes x 3 doses as needed for  chest pain. (Patient not taking: Reported on 02/13/2022)    prochlorperazine (COMPAZINE) 10 MG tablet Take 1 tablet (10 mg total) by mouth every 6 (six) hours as needed for nausea or vomiting. (Patient not taking: Reported on 02/13/2022) 02/13/2022: Last dose 1 month ago   sucralfate (CARAFATE) 1 g tablet Take 1 tablet (1 g total) by mouth 4 (four) times daily -  with meals and at bedtime. Crush and dissolve in 10 mL of warm water, swallow prior to meals (Patient not taking: Reported on 02/13/2022)    Trospium Chloride 60 MG CP24 Take 1 capsule (60 mg total) by mouth daily. 02/13/2022: For OABladder   No facility-administered encounter medications on file as of 02/17/2022.    Patient Active Problem List   Diagnosis Date Noted   Anemia 02/13/2022   Encounter for antineoplastic immunotherapy 11/20/2021   COPD (chronic obstructive pulmonary disease) ([place]) 08/20/2021   Dysphagia 08/20/2021   History of lung cancer 07/31/2021   Malignant neoplasm of unspecified part of unspecified bronchus or lung ([place]) 07/25/2021   Encounter for antineoplastic chemotherapy 07/25/2021   Mediastinal adenopathy 07/15/2021   Pulmonary nodule 1 cm or greater in diameter 07/02/2021   Coronary artery disease 04/11/2021   Right bundle branch block 04/11/2021   Newly recognized heart murmur 04/11/2021   Occult blood positive stool 11/09/2020   Chest pain with moderate risk for cardiac etiology 12/01/2019   Urinary urgency 12/01/2019   Anxiety 02/09/2019   Prediabetes 12/29/2018   Essential hypertension 08/27/2018   Vitamin D deficiency 06/25/2018   Hyperlipidemia 06/25/2018   Hypothyroidism 06/25/2018   Benign hypertension with CKD (chronic kidney disease) stage III ([place]) 06/25/2018   Chronic kidney disease, stage III (moderate) ([place]) 06/25/2018   OAB (overactive bladder) 08/26/2016   CAP (community acquired pneumonia) 04/12/2012   Bacteremia 04/08/2012   LLQ abdominal pain 04/05/2012   Hyponatremia 04/05/2012     Conditions to be addressed/monitored:  Acquired Hypothyroidism, Anxiety, Essential HTN, Prediabetes, Hyperlipidemia, Anemia, Lung Mass ; ADL IADL limitations  Care Plan : Social Work Plan of Care  Updates made by Daneen Schick since 02/17/2022 12:00 AM  Completed 02/17/2022   Problem: Healthy Nutrition (Wellness) Resolved 02/17/2022     Goal: Healthy Nutrition Achieved - Meals on Wheels Referral Completed 02/17/2022  Start Date: 02/17/2022  This Visit's Progress: On track  Priority: High  Note:   Current Barriers:  Chronic disease management support and education needs related to Acquired hypothyroidism, Anxiety, Essential hypertension, Prediabetes, Hyperlipidemia, Anemia, Lung Mass  ADL IADL limitations  Social Worker Clinical Goal(s):  patient will work with SW to identify and address any acute and/or chronic care coordination needs related to the self health management of Acquired hypothyroidism, Anxiety, Essential hypertension, Prediabetes, Hyperlipidemia, Anemia, Lung Mass  Patient will be referred to [employer] [facility] [place] for mobile meals SW Interventions:  Inter-disciplinary care team collaboration (see longitudinal plan of care) Collaboration with [name], [place] regarding development and update of comprehensive plan  of care as evidenced by provider attestation and co-signature Collaboration with [place] who requests SW contact the patient to discuss mobile meals Telephonic visit completed with the patient who reports she is currently driving but very minimally. She drives to the grocery store and to treatment. Patient has lung cancer and has recently been prescribed home oxygen Reviewed with the patient this program is for homebound patients only but SW could place her on the wait list in case her condition changes prior to being called for meal delivery - patient agreeable Referral placed to [facility] to add the patient to the  wait list Encouraged the patient to contact her primary care team as needed Patient Goals/Self-Care Activities patient will:   - Patient will call provider office for new concerns or questions -Engage with [place] when appropriate regarding referral to meals on wheels        Follow Up Plan:  No SW follow up planned at this time. The patient has been referred to [employer]. The patient will remain engaged with RN Care Manager to address care management needs.      Daneen Schick, BSW, CDP Social Worker, Certified Dementia Practitioner [facility] [phone]

## 2022-02-18 ENCOUNTER — Telehealth: Payer: PPO

## 2022-02-18 ENCOUNTER — Ambulatory Visit: Payer: Self-pay

## 2022-02-18 DIAGNOSIS — E785 Hyperlipidemia, unspecified: Secondary | ICD-10-CM

## 2022-02-18 DIAGNOSIS — F419 Anxiety disorder, unspecified: Secondary | ICD-10-CM

## 2022-02-18 DIAGNOSIS — D649 Anemia, unspecified: Secondary | ICD-10-CM

## 2022-02-18 DIAGNOSIS — I10 Essential (primary) hypertension: Secondary | ICD-10-CM

## 2022-02-18 DIAGNOSIS — R918 Other nonspecific abnormal finding of lung field: Secondary | ICD-10-CM

## 2022-02-18 DIAGNOSIS — E039 Hypothyroidism, unspecified: Secondary | ICD-10-CM

## 2022-02-18 DIAGNOSIS — R7303 Prediabetes: Secondary | ICD-10-CM

## 2022-02-18 NOTE — Chronic Care Management (AMB) (Signed)
Chronic Care Management   CCM RN Visit Note  02/18/2022 Name: Abigail [name] MRN: [id number] DOB: 18-Feb-1936  Subjective: Abigail [name] is a 86 y.o. year old female who is a primary care patient of [name], [place]. The care management team was consulted for assistance with disease management and care coordination needs.    Engaged with patient by telephone for follow up visit in response to provider referral for case management and/or care coordination services.   Consent to Services:  The patient was given information about Chronic Care Management services, agreed to services, and gave verbal consent prior to initiation of services.  Please see initial visit note for detailed documentation.   Patient agreed to services and verbal consent obtained.   Assessment: Review of patient past medical history, allergies, medications, health status, including review of consultants reports, laboratory and other test data, was performed as part of comprehensive evaluation and provision of chronic care management services.   SDOH (Social Determinants of Health) assessments and interventions performed:  Yes, see care plan   CCM Care Plan  Allergies  Allergen Reactions   Codeine Nausea And Vomiting   Norvasc [Amlodipine] Rash and Other (See Comments)    rash    Outpatient Encounter Medications as of 02/18/2022  Medication Sig Note   acetaminophen (TYLENOL) 325 MG tablet Take 2 tablets (650 mg total) by mouth every 6 (six) hours as needed for mild pain (or Fever >/= 101). (Patient not taking: Reported on 02/13/2022)    albuterol (VENTOLIN HFA) 108 (90 Base) MCG/ACT inhaler TAKE 2 PUFFS BY MOUTH EVERY 6 HOURS AS NEEDED FOR WHEEZE OR SHORTNESS OF BREATH    aspirin EC 81 MG tablet Take 81 mg by mouth at bedtime.    atorvastatin (LIPITOR) 80 MG tablet Take 1 tablet (80 mg total) by mouth daily at 6 PM.    budesonide-formoterol (SYMBICORT) 80-4.5 MCG/ACT inhaler Inhale 2 puffs into the lungs 2 (two)  times daily.    Cholecalciferol (VITAMIN D3 PO) Take 2 capsules by mouth daily. (Patient not taking: Reported on 02/13/2022)    Cranberry 1000 MG CAPS Take 1,000 mg by mouth daily at 12 noon.    cyclobenzaprine (FLEXERIL) 10 MG tablet Take 1 tablet (10 mg total) by mouth 3 (three) times daily as needed for muscle spasms.    docusate sodium (COLACE) 100 MG capsule Take 100-200 mg by mouth See admin instructions. Take 100 mg in the morning and 200 mg at bedtime    esomeprazole (NEXIUM) 20 MG capsule Take 1 capsule (20 mg total) by mouth 2 (two) times daily before a meal. (Patient not taking: Reported on 02/13/2022)    estradiol (ESTRACE) 0.1 MG/GM vaginal cream Place 0.5 g vaginally once a week. Place 0.5g twice a week at opening of vagina    ezetimibe (ZETIA) 10 MG tablet Take 1 tablet (10 mg total) by mouth daily.    ferrous sulfate 300 (60 Fe) MG/5ML syrup Take 5 mLs (300 mg total) by mouth 2 (two) times daily with a meal.    hydrOXYzine (ATARAX) 10 MG tablet Take 1 tablet (10 mg total) by mouth 3 (three) times daily as needed. (Patient not taking: Reported on 02/13/2022)    levothyroxine (SYNTHROID) 200 MCG tablet Please alternate 175 mcg and 200 mg dose tablet every other day    lisinopril-hydrochlorothiazide (ZESTORETIC) 20-25 MG tablet Take 0.5 tablets by mouth 3 (three) times a week. Take on Monday Wednesday and Friday only    nitroGLYCERIN (NITROSTAT) 0.4  MG SL tablet Place 1 tablet (0.4 mg total) under the tongue every 5 (five) minutes x 3 doses as needed for chest pain. (Patient not taking: Reported on 02/13/2022)    prochlorperazine (COMPAZINE) 10 MG tablet Take 1 tablet (10 mg total) by mouth every 6 (six) hours as needed for nausea or vomiting. (Patient not taking: Reported on 02/13/2022) 02/13/2022: Last dose 1 month ago   sucralfate (CARAFATE) 1 g tablet Take 1 tablet (1 g total) by mouth 4 (four) times daily -  with meals and at bedtime. Crush and dissolve in 10 mL of warm water, swallow prior  to meals (Patient not taking: Reported on 02/13/2022)    Trospium Chloride 60 MG CP24 Take 1 capsule (60 mg total) by mouth daily. 02/13/2022: For OABladder   No facility-administered encounter medications on file as of 02/18/2022.    Patient Active Problem List   Diagnosis Date Noted   Anemia 02/13/2022   Encounter for antineoplastic immunotherapy 11/20/2021   COPD (chronic obstructive pulmonary disease) ([place]) 08/20/2021   Dysphagia 08/20/2021   History of lung cancer 07/31/2021   Malignant neoplasm of unspecified part of unspecified bronchus or lung ([place]) 07/25/2021   Encounter for antineoplastic chemotherapy 07/25/2021   Mediastinal adenopathy 07/15/2021   Pulmonary nodule 1 cm or greater in diameter 07/02/2021   Coronary artery disease 04/11/2021   Right bundle branch block 04/11/2021   Newly recognized heart murmur 04/11/2021   Occult blood positive stool 11/09/2020   Chest pain with moderate risk for cardiac etiology 12/01/2019   Urinary urgency 12/01/2019   Anxiety 02/09/2019   Prediabetes 12/29/2018   Essential hypertension 08/27/2018   Vitamin D deficiency 06/25/2018   Hyperlipidemia 06/25/2018   Hypothyroidism 06/25/2018   Benign hypertension with CKD (chronic kidney disease) stage III ([place]) 06/25/2018   Chronic kidney disease, stage III (moderate) ([place]) 06/25/2018   OAB (overactive bladder) 08/26/2016   CAP (community acquired pneumonia) 04/12/2012   Bacteremia 04/08/2012   LLQ abdominal pain 04/05/2012   Hyponatremia 04/05/2012    Conditions to be addressed/monitored: Acquired hypothyroidism, Anxiety, Essential hypertension, Prediabetes, Hyperlipidemia, Anemia, Lung Mass  Care Plan : RN Care Manager Plan of Care  Updates made by Lynne Logan, RN since 02/18/2022 12:00 AM     Problem: No plan of care established for management of chronic disease states (Acquired hypothyroidism, Anxiety, Essential hypertension, Prediabetes, Hyperlipidemia, Anemia, Lung Mass)    Priority: High     Long-Range Goal: Establishment of plan of care for management of chronic disease states (Acquired hypothyroidism, Anxiety, Essential hypertension, Prediabetes, Hyperlipidemia, Anemia, Lung Mass)   Start Date: 10/30/2021  Expected End Date: 10/31/2022  Recent Progress: On track  Priority: High  Note:   Current Barriers:  Knowledge Deficits related to plan of care for management of Acquired hypothyroidism, Anxiety, Essential hypertension, Prediabetes, Hyperlipidemia, Anemia, Lung Mass  Care Coordination needs related to Acquired hypothyroidism, Anxiety, Essential hypertension, Prediabetes, Hyperlipidemia, Anemia, Lung Mass  RNCM Clinical Goal(s):  Patient will verbalize basic understanding of  Acquired hypothyroidism, Anxiety, Essential hypertension, Prediabetes, Hyperlipidemia, Anemia, Lung Mass disease process and self health management plan as evidenced by patient will report having no disease exacerbations related to her chronic disease states as listed above  take all medications exactly as prescribed and will call provider for medication related questions as evidenced by patient will demonstrate improved understanding of prescribed medications and rationale for usage as evidenced by patient teach back demonstrate Improved health management independence as evidenced by patient will report 100% adherence  to her prescribed treatment plan  continue to work with RN Care Manager to address care management and care coordination needs related to  Acquired hypothyroidism, Anxiety, Essential hypertension, Prediabetes, Hyperlipidemia, Anemia, Lung Mass as evidenced by adherence to CM Team Scheduled appointments demonstrate ongoing self health care management ability  as evidenced by  through collaboration with RN Care manager, provider, and care team.   Interventions: 1:1 collaboration with primary care provider regarding development and update of comprehensive plan of care as  evidenced by provider attestation and co-signature Inter-disciplinary care team collaboration (see longitudinal plan of care) Evaluation of current treatment plan related to  self management and patient's adherence to plan as established by provider  SDOH barriers: (Status: New Goal. ) Short Term Goal  Inbound call from patient  Determined patient learned today she is not eligible for meals on wheels due to the fact that she continues to drive herself short distances to MD appointments Determined patient plans to continue to drive herself as long as she is able, however she does admit difficulty with grocery shopping and states no other family members are available to assist Discussed patient lives near [employer] and would like to continue shopping at this food store Educated patient on the option to order her groceries on line and setting up for pick up or delivery Determined patient plans to ask her son Nicole Kindred to help her with this and will consider giving this a try Collaborated with embedded BSW Daneen Schick regarding patient's ineligibility for meals on wheels and her plan to try the on [place] Encouraged patient to notify this RN CM and or the embedded BSW regarding resources needed for transportation when and if she has a need Discussed plans with patient for ongoing care management follow up and provided patient with direct contact information for care management team   Patient Goals/Self-Care Activities: Take all medications as prescribed Attend all scheduled provider appointments Call pharmacy for medication refills 3-7 days in advance of running out of medications Perform all self care activities independently  Call provider office for new concerns or questions  Establish a exercise routine  Ask you cancer team to check your vitamin d level at next lab draw Balance your activity with rest Stay well hydrated with water and try to eat well balanced small frequent meals  throughout the day   Follow Up Plan:  Telephone follow up appointment with care management team member scheduled for:  04/14/22     Barb Merino, RN, BSN, CCM Care Management Coordinator [facility]  Direct Phone: [phone]

## 2022-02-19 ENCOUNTER — Ambulatory Visit (INDEPENDENT_AMBULATORY_CARE_PROVIDER_SITE_OTHER): Payer: PPO

## 2022-02-19 VITALS — Ht 66.0 in | Wt 158.0 lb

## 2022-02-19 DIAGNOSIS — Z0000 Encounter for general adult medical examination without abnormal findings: Secondary | ICD-10-CM | POA: Diagnosis not present

## 2022-02-19 NOTE — Progress Notes (Signed)
I connected with Abigail [name] today by telephone and verified that I am speaking with the correct person using two identifiers. Location patient: home Location provider: work Persons participating in the virtual visit: patient, provider.   I discussed the limitations, risks, security and privacy concerns of performing an evaluation and management service by telephone and the availability of in person appointments. I also discussed with the patient that there may be a patient responsible charge related to this service. The patient expressed understanding and verbally consented to this telephonic visit.    Interactive audio and video telecommunications were attempted between this provider and patient, however failed, due to patient having technical difficulties OR patient did not have access to video capability.  We continued and completed visit with audio only.     Vital signs may be patient reported or missing.  Subjective:   Abigail [name] is a 86 y.o. female who presents for Medicare Annual (Subsequent) preventive examination.  Review of Systems     Cardiac Risk Factors include: advanced age (>66men, >32 women);dyslipidemia;hypertension     Objective:    Today's Vitals   02/19/22 1155  Weight: 158 lb (71.7 kg)  Height: 5\' 6"  (1.676 m)   Body mass index is 25.5 kg/m.     02/19/2022   12:04 PM 02/13/2022   10:17 AM 12/18/2021   10:51 AM 10/24/2021    8:45 AM 09/10/2021   10:59 AM 04/15/2021    1:37 PM 02/13/2021   10:49 AM  Advanced Directives  Does Patient Have a Medical Advance Directive? No No No No No No No  Would patient like information on creating a medical advance directive?  No - Patient declined No - Patient declined No - Patient declined       Current Medications (verified) Outpatient Encounter Medications as of 02/19/2022  Medication Sig   acetaminophen (TYLENOL) 325 MG tablet Take 2 tablets (650 mg total) by mouth every 6 (six) hours as needed for mild pain (or Fever  >/= 101).   albuterol (VENTOLIN HFA) 108 (90 Base) MCG/ACT inhaler TAKE 2 PUFFS BY MOUTH EVERY 6 HOURS AS NEEDED FOR WHEEZE OR SHORTNESS OF BREATH   aspirin EC 81 MG tablet Take 81 mg by mouth at bedtime.   atorvastatin (LIPITOR) 80 MG tablet Take 1 tablet (80 mg total) by mouth daily at 6 PM.   budesonide-formoterol (SYMBICORT) 80-4.5 MCG/ACT inhaler Inhale 2 puffs into the lungs 2 (two) times daily.   Cranberry 1000 MG CAPS Take 1,000 mg by mouth daily at 12 noon.   cyclobenzaprine (FLEXERIL) 10 MG tablet Take 1 tablet (10 mg total) by mouth 3 (three) times daily as needed for muscle spasms.   docusate sodium (COLACE) 100 MG capsule Take 100-200 mg by mouth See admin instructions. Take 100 mg in the morning and 200 mg at bedtime   estradiol (ESTRACE) 0.1 MG/GM vaginal cream Place 0.5 g vaginally once a week. Place 0.5g twice a week at opening of vagina   ezetimibe (ZETIA) 10 MG tablet Take 1 tablet (10 mg total) by mouth daily.   ferrous sulfate 300 (60 Fe) MG/5ML syrup Take 5 mLs (300 mg total) by mouth 2 (two) times daily with a meal.   levothyroxine (SYNTHROID) 200 MCG tablet Please alternate 175 mcg and 200 mg dose tablet every other day   lisinopril-hydrochlorothiazide (ZESTORETIC) 20-25 MG tablet Take 0.5 tablets by mouth 3 (three) times a week. Take on Monday Wednesday and Friday only   nitroGLYCERIN (NITROSTAT) 0.4 MG  SL tablet Place 1 tablet (0.4 mg total) under the tongue every 5 (five) minutes x 3 doses as needed for chest pain.   Trospium Chloride 60 MG CP24 Take 1 capsule (60 mg total) by mouth daily.   Cholecalciferol (VITAMIN D3 PO) Take 2 capsules by mouth daily. (Patient not taking: Reported on 02/13/2022)   esomeprazole (NEXIUM) 20 MG capsule Take 1 capsule (20 mg total) by mouth 2 (two) times daily before a meal. (Patient not taking: Reported on 02/13/2022)   hydrOXYzine (ATARAX) 10 MG tablet Take 1 tablet (10 mg total) by mouth 3 (three) times daily as needed. (Patient not  taking: Reported on 02/13/2022)   prochlorperazine (COMPAZINE) 10 MG tablet Take 1 tablet (10 mg total) by mouth every 6 (six) hours as needed for nausea or vomiting. (Patient not taking: Reported on 02/13/2022)   sucralfate (CARAFATE) 1 g tablet Take 1 tablet (1 g total) by mouth 4 (four) times daily -  with meals and at bedtime. Crush and dissolve in 10 mL of warm water, swallow prior to meals (Patient not taking: Reported on 02/13/2022)   No facility-administered encounter medications on file as of 02/19/2022.    Allergies (verified) Codeine and Norvasc [amlodipine]   History: Past Medical History:  Diagnosis Date   Anemia    Arthritis    knees, back   Carotid artery occlusion    Chronic kidney disease    stage 3 ckd no nephrologist   Complete uterine prolapse with prolapse of anterior vaginal wall    Complication of anesthesia    hard to wake up per pt   Constipation    Coronary artery disease    Diverticulitis yrs ago coialitis   Dyspnea    History of blood transfusion    History of radiation therapy    right lung 08/05/2021-09/18/2021  Dr Gery Pray   Hypertension    Hypothyroid    Numbness    in hands at times   Pneumonia    Pre-diabetes    Scoliosis    STEMI (ST elevation myocardial infarction) ([place]) 10/26/2019   DES RCA   Wears dentures    full dentures   Wears glasses    for reading   Past Surgical History:  Procedure Laterality Date   ANTERIOR AND POSTERIOR REPAIR WITH SACROSPINOUS FIXATION N/A 12/20/2020   Procedure: SACROSPINOUS LIGAMENT FIXATION;  Surgeon: [name], MD;  Location: [facility];  Service: Gynecology;  Laterality: N/A;  Total time requested for all procedures is 2 hours   Many age 47   lower   bartholin cyst removal  age 17's   BLADDER SUSPENSION N/A 12/20/2020   Procedure: TRANSVAGINAL TAPE (TVT) PROCEDURE;  Surgeon: [name], MD;  Location: [facility];  Service:  Gynecology;  Laterality: N/A;   BRONCHIAL BRUSHINGS  07/15/2021   Procedure: BRONCHIAL BRUSHINGS;  Surgeon: Collene Gobble, MD;  Location: [facility] ENDOSCOPY;  Service: Pulmonary;;   BRONCHIAL NEEDLE ASPIRATION BIOPSY  07/15/2021   Procedure: BRONCHIAL NEEDLE ASPIRATION BIOPSIES;  Surgeon: Collene Gobble, MD;  Location: [place] ENDOSCOPY;  Service: Pulmonary;;   CORONARY/GRAFT ACUTE MI REVASCULARIZATION N/A 10/26/2019   Procedure: Coronary/Graft Acute MI Revascularization;  Surgeon: Sherren Mocha, MD;  Location: [place] [facility];  Service: Cardiovascular;  Laterality: N/A;   CYSTOCELE REPAIR N/A 12/20/2020   Procedure: ANTERIOR AND POSTERIOR REPAIR WITH PERINEORRHAPHY;  Surgeon: [name], MD;  Location: [facility];  Service: Gynecology;  Laterality:  N/A;   CYSTOSCOPY N/A 12/20/2020   Procedure: CYSTOSCOPY;  Surgeon: [name], MD;  Location: [facility];  Service: Gynecology;  Laterality: N/A;   ELBOW SURGERY  1990's   left   ENDOBRONCHIAL ULTRASOUND N/A 07/15/2021   Procedure: ENDOBRONCHIAL ULTRASOUND;  Surgeon: Collene Gobble, MD;  Location[facility] END[facility]COPY;  Service: Pulmonary;  Laterality: N/A;   HEMOSTASIS CONTROL  07/15/2021   Procedure: HEMOSTASIS CONTROL;  Surgeon: Collene Gobble, MD;  Location: [facility] ENDOSCOPY;  Service: Pulmonary;;   LEFT HEART CATH AND CORONARY ANGIOGRAPHY N/A 10/26/2019   Procedure: LEFT HEART CATH AND CORONARY ANGIOGRAPHY;  Surgeon: Sherren Mocha, MD;  Location: [place] [facility];  Service: Cardiovascular;  Laterality: N/A;   TONSILLECTOMY     Family History  Problem Relation Age of Onset   Hypertension Mother    Social History   Socioeconomic History   Marital status: Widowed    Spouse name: Not on file   Number of children: Not on file   Years of education: Not on file   Highest education level: Not on file  Occupational History   Occupation: semi retired  Tobacco Use   Smoking status: Former     Packs/day: 1.50    Years: 35.00    Total pack years: 52.50    Types: Cigarettes    Quit date: 04/05/1986    Years since quitting: 35.9   Smokeless tobacco: Never  Vaping Use   Vaping Use: Never used  Substance and Sexual Activity   Alcohol use: No   Drug use: No   Sexual activity: Not Currently    Birth control/protection: Post-menopausal  Other Topics Concern   Not on file  Social History Narrative   Not on file   Social Determinants of Health   Financial Resource Strain: Low Risk  (02/19/2022)   Overall Financial Resource Strain (CARDIA)    Difficulty of Paying Living Expenses: Not hard at all  Food Insecurity: No Food Insecurity (02/19/2022)   Hunger Vital Sign    Worried About Running Out of Food in the Last Year: Never true    [place] in the Last Year: Never true  Transportation Needs: No Transportation Needs (02/19/2022)   PRAPARE - Hydrologist (Medical): No    Lack of Transportation (Non-Medical): No  Physical Activity: Inactive (02/19/2022)   Exercise Vital Sign    Days of Exercise per Week: 0 days    Minutes of Exercise per Session: 0 min  Stress: No Stress Concern Present (02/19/2022)   [place]    Feeling of Stress : Not at all  Social Connections: Not on file    Tobacco Counseling Counseling given: Not Answered   Clinical Intake:  Pre-visit preparation completed: Yes  Pain : No/denies pain     Nutritional Status: BMI 25 -29 Overweight Nutritional Risks: None Diabetes: No  How often do you need to have someone help you when you read instructions, pamphlets, or other written materials from your doctor or pharmacy?: 1 - Never  Diabetic? no  Interpreter Needed?: No  Information entered by :: NAllen LPN   Activities of Daily Living    02/19/2022   12:06 PM  In your present state of health, do you have any difficulty performing the following  activities:  Hearing? 0  Vision? 0  Difficulty concentrating or making decisions? 0  Walking or climbing stairs? 1  Dressing or bathing?  0  Doing errands, shopping? 0  Preparing Food and eating ? N  Using the Toilet? N  In the past six months, have you accidently leaked urine? N  Do you have problems with loss of bowel control? N  Managing your Medications? N  Managing your Finances? N  Housekeeping or managing your Housekeeping? N    Patient Care Team: [name], FNP as PCP - General ([place]) Jettie Booze, MD as PCP - Cardiology (Cardiology) Rex Kras, Claudette Stapler, RN as [place] any recent Pray you may have received from other than [facility] providers in the past year (date may be approximate).     Assessment:   This is a routine wellness examination for [facility].  Hearing/Vision screen Vision Screening - Comments:: No regular eye exams,  Dietary issues and exercise activities discussed: Current Exercise Habits: The patient does not participate in regular exercise at present   Goals Addressed             This Visit's Progress    Patient Stated       02/19/2022, no goals       Depression Screen    02/19/2022   12:05 PM 02/13/2022    5:28 PM 02/13/2021   10:50 AM 01/11/2020    8:53 AM 11/29/2019   11:31 AM 06/30/2019    8:40 AM 02/09/2019   10:09 AM  PHQ 2/9 Scores  PHQ - 2 Score 0 0 0 0 0 0 3  PHQ- 9 Score  0  0   5    Fall Risk    02/19/2022   12:05 PM 02/13/2022    5:28 PM 02/13/2021   10:49 AM 01/11/2020    8:51 AM 11/29/2019   11:31 AM  Fall Risk   Falls in the past year? 0 0 0 0 0  Number falls in past yr: 0 0     Injury with Fall? 0 0     Risk for fall due to : Medication side effect No Fall Risks Medication side effect Medication side effect;Impaired balance/gait   Follow up Falls evaluation completed;Education provided;Falls prevention discussed Falls evaluation completed Falls evaluation  completed;Education provided;Falls prevention discussed Falls evaluation completed;Education provided;Falls prevention discussed     FALL RISK PREVENTION PERTAINING TO THE HOME:  Any stairs in or around the home? No  If so, are there any without handrails? N/a Home free of loose throw rugs in walkways, pet beds, electrical cords, etc? Yes  Adequate lighting in your home to reduce risk of falls? Yes   ASSISTIVE DEVICES UTILIZED TO PREVENT FALLS:  Life alert? No  Use of a cane, walker or w/c? No  Grab bars in the bathroom? Yes  Shower chair or bench in shower? Yes  Elevated toilet seat or a handicapped toilet? Yes   TIMED UP AND GO:  Was the test performed? No .      Cognitive Function:        02/19/2022   12:08 PM 02/13/2021   10:51 AM 01/11/2020    8:58 AM 12/29/2018   11:20 AM  6CIT Screen  What Year? 0 points 0 points 0 points 0 points  What month? 0 points 0 points 0 points 0 points  What time? 0 points 0 points 0 points 3 points  Count back from 20 0 points 0 points 0 points 0 points  Months in reverse 0 points 0 points 0 points 0 points  Repeat phrase 2 points  2 points 0 points 0 points  Total Score 2 points 2 points 0 points 3 points    Immunizations Immunization History  Administered Date(s) Administered   Influenza Nasal 06/28/2019   Influenza, High Dose Seasonal PF 06/18/2018, 07/14/2021   Influenza-Unspecified 05/14/2015, 04/25/2017, 06/19/2018   PFIZER(Purple Top)SARS-COV-2 Vaccination 12/10/2019, 01/02/2020, 07/10/2020, 01/11/2021   Pneumococcal Conjugate-13 09/01/2019   Pneumococcal Polysaccharide-23 11/26/2021   Pneumococcal-Unspecified 08/24/2012   Tdap 02/17/2008, 01/11/2020   Zoster Recombinat (Shingrix) 06/28/2019, 10/05/2019    TDAP status: Up to date  Flu Vaccine status: Up to date  Pneumococcal vaccine status: Up to date  Covid-19 vaccine status: Completed vaccines  Qualifies for Shingles Vaccine? Yes   Zostavax completed Yes    Shingrix Completed?: Yes  Screening Tests Health Maintenance  Topic Date Due   COVID-19 Vaccine (5 - Booster for Pfizer series) 03/08/2021   INFLUENZA VACCINE  03/25/2022   TETANUS/TDAP  01/10/2030   Pneumonia Vaccine 89+ Years old  Completed   DEXA SCAN  Completed   Zoster Vaccines- Shingrix  Completed   HPV VACCINES  Aged Out    Health Maintenance  Health Maintenance Due  Topic Date Due   COVID-19 Vaccine (5 - Booster for [place] series) 03/08/2021    Colorectal cancer screening: No longer required.   Mammogram status: No longer required due to age.  Bone Density status: Completed 04/08/2002.   Lung Cancer Screening: (Low Dose CT Chest recommended if Age 58-80 years, 30 pack-year currently smoking OR have quit w/in 15years.) does not qualify.   Lung Cancer Screening Referral: no  Additional Screening:  Hepatitis C Screening: does not qualify;   Vision Screening: Recommended annual ophthalmology exams for early detection of glaucoma and other disorders of the eye. Is the patient up to date with their annual eye exam?  No  Who is the provider or what is the name of the office in which the patient attends annual eye exams? none If pt is not established with a provider, would they like to be referred to a provider to establish care? No .   Dental Screening: Recommended annual dental exams for proper oral hygiene  Community Resource Referral / Chronic Care Management: CRR required this visit?  No   CCM required this visit?  No      Plan:     I have personally reviewed and noted the following in the patient's chart:   Medical and social history Use of alcohol, tobacco or illicit drugs  Current medications and supplements including opioid prescriptions.  Functional ability and status Nutritional status Physical activity Advanced directives List of other physicians Hospitalizations, surgeries, and ER visits in previous 12 months Vitals Screenings to include  cognitive, depression, and falls Referrals and appointments  In addition, I have reviewed and discussed with patient certain preventive protocols, quality metrics, and best practice recommendations. A written personalized care plan for preventive services as well as general preventive health recommendations were provided to patient.     Kellie Simmering, LPN   [id number]   Nurse Notes: none  Due to this being a virtual visit, the after visit summary with patients personalized plan was offered to patient via mail or my-chart.  Patient preferred to pick up at office at next visit

## 2022-02-19 NOTE — Patient Instructions (Signed)
Abigail [name] , Thank you for taking time to come for your Medicare Wellness Visit. I appreciate your ongoing commitment to your health goals. Please review the following plan we discussed and let me know if I can assist you in the future.   Screening recommendations/referrals: Colonoscopy: not required Mammogram: not required Bone Density: completed 04/08/2002 Recommended yearly ophthalmology/optometry visit for glaucoma screening and checkup Recommended yearly dental visit for hygiene and checkup  Vaccinations: Influenza vaccine: due 03/25/2022 Pneumococcal vaccine: completed 11/26/2021 Tdap vaccine: completed 01/11/2020, due 01/10/2030 Shingles vaccine: completed   Covid-19: 01/11/2021, 07/10/2020, 01/02/2020, 12/10/2019  Advanced directives: Advance directive discussed with you today.   Conditions/risks identified: none  Next appointment: Follow up in one year for your annual wellness visit    Preventive Care 65 Years and Older, Female Preventive care refers to lifestyle choices and visits with your health care provider that can promote health and wellness. What does preventive care include? A yearly physical exam. This is also called an annual well check. Dental exams once or twice a year. Routine eye exams. Ask your health care provider how often you should have your eyes checked. Personal lifestyle choices, including: Daily care of your teeth and gums. Regular physical activity. Eating a healthy diet. Avoiding tobacco and drug use. Limiting alcohol use. Practicing safe sex. Taking low-dose aspirin every day. Taking vitamin and mineral supplements as recommended by your health care provider. What happens during an annual well check? The services and screenings done by your health care provider during your annual well check will depend on your age, overall health, lifestyle risk factors, and family history of disease. Counseling  Your health care provider may ask you questions about  your: Alcohol use. Tobacco use. Drug use. Emotional well-being. Home and relationship well-being. Sexual activity. Eating habits. History of falls. Memory and ability to understand (cognition). Work and work Statistician. Reproductive health. Screening  You may have the following tests or measurements: Height, weight, and BMI. Blood pressure. Lipid and cholesterol levels. These may be checked every 5 years, or more frequently if you are over 18 years old. Skin check. Lung cancer screening. You may have this screening every year starting at age 72 if you have a 30-pack-year history of smoking and currently smoke or have quit within the past 15 years. Fecal occult blood test (FOBT) of the stool. You may have this test every year starting at age 5. Flexible sigmoidoscopy or colonoscopy. You may have a sigmoidoscopy every 5 years or a colonoscopy every 10 years starting at age 58. Hepatitis C blood test. Hepatitis B blood test. Sexually transmitted disease (STD) testing. Diabetes screening. This is done by checking your blood sugar (glucose) after you have not eaten for a while (fasting). You may have this done every 1-3 years. Bone density scan. This is done to screen for osteoporosis. You may have this done starting at age 94. Mammogram. This may be done every 1-2 years. Talk to your health care provider about how often you should have regular mammograms. Talk with your health care provider about your test results, treatment options, and if necessary, the need for more tests. Vaccines  Your health care provider may recommend certain vaccines, such as: Influenza vaccine. This is recommended every year. Tetanus, diphtheria, and acellular pertussis (Tdap, Td) vaccine. You may need a Td booster every 10 years. Zoster vaccine. You may need this after age 82. Pneumococcal 13-valent conjugate (PCV13) vaccine. One dose is recommended after age 37. Pneumococcal polysaccharide (PPSV23) vaccine.  One dose is recommended after age 55. Talk to your health care provider about which screenings and vaccines you need and how often you need them. This information is not intended to replace advice given to you by your health care provider. Make sure you discuss any questions you have with your health care provider. Document Released: 09/07/2015 Document Revised: 04/30/2016 Document Reviewed: 06/12/2015 Elsevier Interactive Patient Education  2017 [employer] Prevention in the Home Falls can cause injuries. They can happen to people of all ages. There are many things you can do to make your home safe and to help prevent falls. What can I do on the outside of my home? Regularly fix the edges of walkways and driveways and fix any cracks. Remove anything that might make you trip as you walk through a door, such as a raised step or threshold. Trim any bushes or trees on the path to your home. Use bright outdoor lighting. Clear any walking paths of anything that might make someone trip, such as rocks or tools. Regularly check to see if handrails are loose or broken. Make sure that both sides of any steps have handrails. Any raised decks and porches should have guardrails on the edges. Have any leaves, snow, or ice cleared regularly. Use sand or salt on walking paths during winter. Clean up any spills in your garage right away. This includes oil or grease spills. What can I do in the bathroom? Use night lights. Install grab bars by the toilet and in the tub and shower. Do not use towel bars as grab bars. Use non-skid mats or decals in the tub or shower. If you need to sit down in the shower, use a plastic, non-slip stool. Keep the floor dry. Clean up any water that spills on the floor as soon as it happens. Remove soap buildup in the tub or shower regularly. Attach bath mats securely with double-sided non-slip rug tape. Do not have throw rugs and other things on the floor that can make  you trip. What can I do in the bedroom? Use night lights. Make sure that you have a light by your bed that is easy to reach. Do not use any sheets or blankets that are too big for your bed. They should not hang down onto the floor. Have a firm chair that has side arms. You can use this for support while you get dressed. Do not have throw rugs and other things on the floor that can make you trip. What can I do in the kitchen? Clean up any spills right away. Avoid walking on wet floors. Keep items that you use a lot in easy-to-reach places. If you need to reach something above you, use a strong step stool that has a grab bar. Keep electrical cords out of the way. Do not use floor polish or wax that makes floors slippery. If you must use wax, use non-skid floor wax. Do not have throw rugs and other things on the floor that can make you trip. What can I do with my stairs? Do not leave any items on the stairs. Make sure that there are handrails on both sides of the stairs and use them. Fix handrails that are broken or loose. Make sure that handrails are as long as the stairways. Check any carpeting to make sure that it is firmly attached to the stairs. Fix any carpet that is loose or worn. Avoid having throw rugs at the top or bottom of the  stairs. If you do have throw rugs, attach them to the floor with carpet tape. Make sure that you have a light switch at the top of the stairs and the bottom of the stairs. If you do not have them, ask someone to add them for you. What else can I do to help prevent falls? Wear shoes that: Do not have high heels. Have rubber bottoms. Are comfortable and fit you well. Are closed at the toe. Do not wear sandals. If you use a stepladder: Make sure that it is fully opened. Do not climb a closed stepladder. Make sure that both sides of the stepladder are locked into place. Ask someone to hold it for you, if possible. Clearly mark and make sure that you can  see: Any grab bars or handrails. First and last steps. Where the edge of each step is. Use tools that help you move around (mobility aids) if they are needed. These include: Canes. Walkers. Scooters. Crutches. Turn on the lights when you go into a dark area. Replace any light bulbs as soon as they burn out. Set up your furniture so you have a clear path. Avoid moving your furniture around. If any of your floors are uneven, fix them. If there are any pets around you, be aware of where they are. Review your medicines with your doctor. Some medicines can make you feel dizzy. This can increase your chance of falling. Ask your doctor what other things that you can do to help prevent falls. This information is not intended to replace advice given to you by your health care provider. Make sure you discuss any questions you have with your health care provider. Document Released: 06/07/2009 Document Revised: 01/17/2016 Document Reviewed: 09/15/2014 Elsevier Interactive Patient Education  2017 [employer].

## 2022-02-21 DIAGNOSIS — C3490 Malignant neoplasm of unspecified part of unspecified bronchus or lung: Secondary | ICD-10-CM | POA: Diagnosis not present

## 2022-02-21 DIAGNOSIS — E039 Hypothyroidism, unspecified: Secondary | ICD-10-CM

## 2022-02-21 DIAGNOSIS — I10 Essential (primary) hypertension: Secondary | ICD-10-CM | POA: Diagnosis not present

## 2022-02-21 DIAGNOSIS — D649 Anemia, unspecified: Secondary | ICD-10-CM | POA: Diagnosis not present

## 2022-02-21 DIAGNOSIS — E785 Hyperlipidemia, unspecified: Secondary | ICD-10-CM

## 2022-03-05 ENCOUNTER — Telehealth: Payer: PPO

## 2022-03-13 ENCOUNTER — Inpatient Hospital Stay: Payer: PPO

## 2022-03-13 ENCOUNTER — Other Ambulatory Visit: Payer: Self-pay

## 2022-03-13 ENCOUNTER — Inpatient Hospital Stay (HOSPITAL_BASED_OUTPATIENT_CLINIC_OR_DEPARTMENT_OTHER): Payer: PPO | Admitting: Internal Medicine

## 2022-03-13 ENCOUNTER — Inpatient Hospital Stay: Payer: PPO | Attending: Internal Medicine

## 2022-03-13 ENCOUNTER — Telehealth: Payer: Self-pay

## 2022-03-13 VITALS — BP 153/70 | HR 82 | Temp 98.1°F | Resp 16 | Wt 160.6 lb

## 2022-03-13 DIAGNOSIS — C3490 Malignant neoplasm of unspecified part of unspecified bronchus or lung: Secondary | ICD-10-CM

## 2022-03-13 DIAGNOSIS — Z5112 Encounter for antineoplastic immunotherapy: Secondary | ICD-10-CM | POA: Diagnosis not present

## 2022-03-13 DIAGNOSIS — D649 Anemia, unspecified: Secondary | ICD-10-CM

## 2022-03-13 DIAGNOSIS — C3432 Malignant neoplasm of lower lobe, left bronchus or lung: Secondary | ICD-10-CM | POA: Insufficient documentation

## 2022-03-13 DIAGNOSIS — Z79899 Other long term (current) drug therapy: Secondary | ICD-10-CM | POA: Diagnosis not present

## 2022-03-13 DIAGNOSIS — R1310 Dysphagia, unspecified: Secondary | ICD-10-CM | POA: Insufficient documentation

## 2022-03-13 LAB — CMP (CANCER CENTER ONLY)
ALT: 8 U/L (ref 0–44)
AST: 12 U/L — ABNORMAL LOW (ref 15–41)
Albumin: 4 g/dL (ref 3.5–5.0)
Alkaline Phosphatase: 75 U/L (ref 38–126)
Anion gap: 5 (ref 5–15)
BUN: 21 mg/dL (ref 8–23)
CO2: 28 mmol/L (ref 22–32)
Calcium: 9.3 mg/dL (ref 8.9–10.3)
Chloride: 102 mmol/L (ref 98–111)
Creatinine: 1.23 mg/dL — ABNORMAL HIGH (ref 0.44–1.00)
GFR, Estimated: 43 mL/min — ABNORMAL LOW (ref >60)
Glucose, Bld: 115 mg/dL — ABNORMAL HIGH (ref 70–99)
Potassium: 4.3 mmol/L (ref 3.5–5.1)
Sodium: 135 mmol/L (ref 135–145)
Total Bilirubin: 0.3 mg/dL (ref 0.3–1.2)
Total Protein: 7.4 g/dL (ref 6.5–8.1)

## 2022-03-13 LAB — CBC WITH DIFFERENTIAL (CANCER CENTER ONLY)
Abs Immature Granulocytes: 0.01 10*3/uL (ref 0.00–0.07)
Basophils Absolute: 0 10*3/uL (ref 0.0–0.1)
Basophils Relative: 1 %
Eosinophils Absolute: 0.2 10*3/uL (ref 0.0–0.5)
Eosinophils Relative: 3 %
HCT: 29.8 % — ABNORMAL LOW (ref 36.0–46.0)
Hemoglobin: 9 g/dL — ABNORMAL LOW (ref 12.0–15.0)
Immature Granulocytes: 0 %
Lymphocytes Relative: 5 %
Lymphs Abs: 0.3 10*3/uL — ABNORMAL LOW (ref 0.7–4.0)
MCH: 26 pg (ref 26.0–34.0)
MCHC: 30.2 g/dL (ref 30.0–36.0)
MCV: 86.1 fL (ref 80.0–100.0)
Monocytes Absolute: 0.6 10*3/uL (ref 0.1–1.0)
Monocytes Relative: 10 %
Neutro Abs: 4.9 10*3/uL (ref 1.7–7.7)
Neutrophils Relative %: 81 %
Platelet Count: 248 10*3/uL (ref 150–400)
RBC: 3.46 MIL/uL — ABNORMAL LOW (ref 3.87–5.11)
RDW: 18.5 % — ABNORMAL HIGH (ref 11.5–15.5)
WBC Count: 6 10*3/uL (ref 4.0–10.5)
nRBC: 0 % (ref 0.0–0.2)

## 2022-03-13 LAB — IRON AND IRON BINDING CAPACITY (CC-WL,HP ONLY)
Iron: 34 ug/dL (ref 28–170)
Saturation Ratios: 9 % — ABNORMAL LOW (ref 10.4–31.8)
TIBC: 396 ug/dL (ref 250–450)
UIBC: 362 ug/dL (ref 148–442)

## 2022-03-13 LAB — SAMPLE TO BLOOD BANK

## 2022-03-13 LAB — TSH: TSH: 0.429 u[IU]/mL (ref 0.350–4.500)

## 2022-03-13 LAB — FERRITIN: Ferritin: 15 ng/mL (ref 11–307)

## 2022-03-13 LAB — OCCULT BLOOD X 1 CARD TO LAB, STOOL
Fecal Occult Bld: NEGATIVE
Fecal Occult Bld: NEGATIVE
Fecal Occult Bld: POSITIVE — AB

## 2022-03-13 MED ORDER — SODIUM CHLORIDE 0.9 % IV SOLN
1500.0000 mg | Freq: Once | INTRAVENOUS | Status: AC
  Administered 2022-03-13: 1500 mg via INTRAVENOUS
  Filled 2022-03-13: qty 30

## 2022-03-13 MED ORDER — SODIUM CHLORIDE 0.9 % IV SOLN: Freq: Once | INTRAVENOUS | Status: AC

## 2022-03-13 NOTE — Patient Instructions (Signed)
[facility]COL[facility]  Discharge Instructions: Thank you for choosing [facility] to provide your oncology and hematology care.   If you have a lab appointment with the [facility], please go directly to the [facility] and check in at the registration area.   Wear comfortable clothing and clothing appropriate for easy access to any Portacath or PICC line.   We strive to give you quality time with your provider. You may need to reschedule your appointment if you arrive late (15 or more minutes).  Arriving late affects you and other patients whose appointments are after yours.  Also, if you miss three or more appointments without notifying the office, you may be dismissed from the clinic at the provider's discretion.      For prescription refill requests, have your pharmacy contact our office and allow 72 hours for refills to be completed.    Today you received the following chemotherapy and/or immunotherapy agents: durvalumab      To help prevent nausea and vomiting after your treatment, we encourage you to take your nausea medication as directed.  BELOW ARE SYMPTOMS THAT SHOULD BE REPORTED IMMEDIATELY: *FEVER GREATER THAN 100.4 F (38 C) OR HIGHER *CHILLS OR SWEATING *NAUSEA AND VOMITING THAT IS NOT CONTROLLED WITH YOUR NAUSEA MEDICATION *UNUSUAL SHORTNESS OF BREATH *UNUSUAL BRUISING OR BLEEDING *URINARY PROBLEMS (pain or burning when urinating, or frequent urination) *BOWEL PROBLEMS (unusual diarrhea, constipation, pain near the anus) TENDERNESS IN MOUTH AND THROAT WITH OR WITHOUT PRESENCE OF ULCERS (sore throat, sores in mouth, or a toothache) UNUSUAL RASH, SWELLING OR PAIN  UNUSUAL VAGINAL DISCHARGE OR ITCHING   Items with * indicate a potential emergency and should be followed up as soon as possible or go to the Emergency Department if any problems should occur.  Please show the CHEMOTHERAPY ALERT CARD or IMMUNOTHERAPY ALERT CARD at check-in to  the Emergency Department and triage nurse.  Should you have questions after your visit or need to cancel or reschedule your appointment, please contact [facility]  Dept: [phone]  and follow the prompts.  Office hours are 8:00 a.m. to 4:30 p.m. Monday - Friday. Please note that voicemails left after 4:00 p.m. may not be returned until the following business day.  We are closed weekends and major holidays. You have access to a nurse at all times for urgent questions. Please call the main number to the clinic Dept: [phone] and follow the prompts.   For any non-urgent questions, you may also contact your provider using MyChart. We now offer e-Visits for anyone 83 and older to request care online for non-urgent symptoms. For details visit [email or web address].   Also download the MyChart app! Go to the app store, search "MyChart", open the app, select [facility], and log in with your MyChart username and password.  Masks are optional in the cancer centers. If you would like for your care team to wear a mask while they are taking care of you, please let them know. For doctor visits, patients may have with them one support person who is at least 84 years old. At this time, visitors are not allowed in the infusion area.

## 2022-03-13 NOTE — Telephone Encounter (Signed)
Sent request through [employer]. Spoke With [employer] @ [employer].

## 2022-03-13 NOTE — Progress Notes (Signed)
Per [place] PA. referral placed to [facility].

## 2022-03-13 NOTE — Progress Notes (Signed)
[facility] Telephone:(336) [phone]   Fax:(336) [phone]  OFFICE PROGRESS NOTE  [name], [address] [place] [address]  DIAGNOSIS: Stage IIIA (T2a, N2, M0) non-small cell lung cancer, adenocarcinoma presented with left lower lobe lung mass in addition to left hilar and mediastinal lymphadenopathy diagnosed in November 2022 with no actionable mutation on the guardant blood test   PRIOR THERAPY: Weekly concurrent chemoradiation with carboplatin for an AUC of 2 and paclitaxel 45 mg per metered squared.  First dose on 08/05/2021.  Status post 6 cycles.  Last dose was given on September 03, 2021.   CURRENT THERAPY: Consolidation treatment with immunotherapy with Imfinzi 1500 Mg IV every 4 weeks.  First dose October 23, 2021.  Status post 5 cycles.  INTERVAL HISTORY: Abigail [name] 86 y.o. female returns to the clinic today for follow-up visit.  The patient is feeling fine today with no concerning complaints except for the baseline shortness of breath.  She denied having any current chest pain, cough or hemoptysis.  She has no nausea, vomiting, diarrhea or constipation.  She has no headache or visual changes.  She noticed some brownish discoloration of her diaper from the urine.  She denied having any dysuria or hematuria.  She is here today for evaluation before starting cycle #6.  MEDICAL HISTORY: Past Medical History:  Diagnosis Date   Anemia    Arthritis    knees, back   Carotid artery occlusion    Chronic kidney disease    stage 3 ckd no nephrologist   Complete uterine prolapse with prolapse of anterior vaginal wall    Complication of anesthesia    hard to wake up per pt   Constipation    Coronary artery disease    Diverticulitis yrs ago coialitis   Dyspnea    History of blood transfusion    History of radiation therapy    right lung 08/05/2021-09/18/2021  Dr Gery Pray   Hypertension    Hypothyroid    Numbness    in hands at times    Pneumonia    Pre-diabetes    Scoliosis    STEMI (ST elevation myocardial infarction) ([facility]) 10/26/2019   DES RCA   Wears dentures    full dentures   Wears glasses    for reading    ALLERGIES:  is allergic to codeine and norvasc [amlodipine].  MEDICATIONS:  Current Outpatient Medications  Medication Sig Dispense Refill   acetaminophen (TYLENOL) 325 MG tablet Take 2 tablets (650 mg total) by mouth every 6 (six) hours as needed for mild pain (or Fever >/= 101).     albuterol (VENTOLIN HFA) 108 (90 Base) MCG/ACT inhaler TAKE 2 PUFFS BY MOUTH EVERY 6 HOURS AS NEEDED FOR WHEEZE OR SHORTNESS OF BREATH 8.5 each 1   aspirin EC 81 MG tablet Take 81 mg by mouth at bedtime.     atorvastatin (LIPITOR) 80 MG tablet Take 1 tablet (80 mg total) by mouth daily at 6 PM. 90 tablet 3   budesonide-formoterol (SYMBICORT) 80-4.5 MCG/ACT inhaler Inhale 2 puffs into the lungs 2 (two) times daily. 3 each 1   Cholecalciferol (VITAMIN D3 PO) Take 2 capsules by mouth daily. (Patient not taking: Reported on 02/13/2022)     Cranberry 1000 MG CAPS Take 1,000 mg by mouth daily at 12 noon.     cyclobenzaprine (FLEXERIL) 10 MG tablet Take 1 tablet (10 mg total) by mouth 3 (three) times daily as needed  for muscle spasms. 30 tablet 1   docusate sodium (COLACE) 100 MG capsule Take 100-200 mg by mouth See admin instructions. Take 100 mg in the morning and 200 mg at bedtime     esomeprazole (NEXIUM) 20 MG capsule Take 1 capsule (20 mg total) by mouth 2 (two) times daily before a meal. (Patient not taking: Reported on 02/13/2022) 60 capsule 3   estradiol (ESTRACE) 0.1 MG/GM vaginal cream Place 0.5 g vaginally once a week. Place 0.5g twice a week at opening of vagina     ezetimibe (ZETIA) 10 MG tablet Take 1 tablet (10 mg total) by mouth daily. 90 tablet 3   ferrous sulfate 300 (60 Fe) MG/5ML syrup Take 5 mLs (300 mg total) by mouth 2 (two) times daily with a meal. 300 mL 11   hydrOXYzine (ATARAX) 10 MG tablet Take 1 tablet (10 mg  total) by mouth 3 (three) times daily as needed. (Patient not taking: Reported on 02/13/2022) 30 tablet 0   levothyroxine (SYNTHROID) 200 MCG tablet Please alternate 175 mcg and 200 mg dose tablet every other day 30 tablet 1   lisinopril-hydrochlorothiazide (ZESTORETIC) 20-25 MG tablet Take 0.5 tablets by mouth 3 (three) times a week. Take on Monday Wednesday and Friday only 90 tablet 1   nitroGLYCERIN (NITROSTAT) 0.4 MG SL tablet Place 1 tablet (0.4 mg total) under the tongue every 5 (five) minutes x 3 doses as needed for chest pain. 25 tablet 3   prochlorperazine (COMPAZINE) 10 MG tablet Take 1 tablet (10 mg total) by mouth every 6 (six) hours as needed for nausea or vomiting. (Patient not taking: Reported on 02/13/2022) 30 tablet 2   sucralfate (CARAFATE) 1 g tablet Take 1 tablet (1 g total) by mouth 4 (four) times daily -  with meals and at bedtime. Crush and dissolve in 10 mL of warm water, swallow prior to meals (Patient not taking: Reported on 02/13/2022) 120 tablet 1   Trospium Chloride 60 MG CP24 Take 1 capsule (60 mg total) by mouth daily. 30 capsule 11   No current facility-administered medications for this visit.    SURGICAL HISTORY:  Past Surgical History:  Procedure Laterality Date   ANTERIOR AND POSTERIOR REPAIR WITH SACROSPINOUS FIXATION N/A 12/20/2020   Procedure: SACROSPINOUS LIGAMENT FIXATION;  Surgeon: [name], MD;  Location: [facility];  Service: Gynecology;  Laterality: N/A;  Total time requested for all procedures is 2 hours   [place] age 32   lower   bartholin cyst removal  age 22's   BLADDER SUSPENSION N/A 12/20/2020   Procedure: TRANSVAGINAL TAPE (TVT) PROCEDURE;  Surgeon: [name], MD;  Location: [facility];  Service: Gynecology;  Laterality: N/A;   BRONCHIAL BRUSHINGS  07/15/2021   Procedure: BRONCHIAL BRUSHINGS;  Surgeon: Collene Gobble, MD;  Location: [facility] ENDOSCOPY;  Service: Pulmonary;;    BRONCHIAL NEEDLE ASPIRATION BIOPSY  07/15/2021   Procedure: BRONCHIAL NEEDLE ASPIRATION BIOPSIES;  Surgeon: Collene Gobble, MD;  Location: [facility] ENDOSCOPY;  Service: Pulmonary;;   CORONARY/GRAFT ACUTE MI REVASCULARIZATION N/A 10/26/2019   Procedure: Coronary/Graft Acute MI Revascularization;  Surgeon: Sherren Mocha, MD;  Location: [facility] CV LAB;  Service: Cardiovascular;  Laterality: N/A;   CYSTOCELE REPAIR N/A 12/20/2020   Procedure: ANTERIOR AND POSTERIOR REPAIR WITH PERINEORRHAPHY;  Surgeon: [name], MD;  Location: [facility];  Service: Gynecology;  Laterality: N/A;   CYSTOSCOPY N/A 12/20/2020   Procedure: CYSTOSCOPY;  Surgeon: [name],  MD;  Location: [facility];  Service: Gynecology;  Laterality: N/A;   ELBOW SURGERY  1990's   left   ENDOBRONCHIAL ULTRASOUND N/A 07/15/2021   Procedure: ENDOBRONCHIAL ULTRASOUND;  Surgeon: Collene Gobble, MD;  Location: [facility] ENDOSCOPY;  Service: Pulmonary;  Laterality: N/A;   HEMOSTASIS CONTROL  07/15/2021   Procedure: HEMOSTASIS CONTROL;  Surgeon: Collene Gobble, MD;  Location: [facility] ENDOSCOPY;  Service: Pulmonary;;   LEFT HEART CATH AND CORONARY ANGIOGRAPHY N/A 10/26/2019   Procedure: LEFT HEART CATH AND CORONARY ANGIOGRAPHY;  Surgeon: Sherren Mocha, MD;  Location: [facility] CV LAB;  Service: Cardiovascular;  Laterality: N/A;   TONSILLECTOMY      REVIEW OF SYSTEMS:  A comprehensive review of systems was negative except for: Constitutional: positive for fatigue Respiratory: positive for dyspnea on exertion   PHYSICAL EXAMINATION: General appearance: alert, cooperative, fatigued, and no distress Head: Normocephalic, without obvious abnormality, atraumatic Neck: no adenopathy, no JVD, supple, symmetrical, trachea midline, and thyroid not enlarged, symmetric, no tenderness/mass/nodules Lymph nodes: Cervical, supraclavicular, and axillary nodes normal. Resp: clear to auscultation  bilaterally Back: symmetric, no curvature. ROM normal. No CVA tenderness. Cardio: regular rate and rhythm, S1, S2 normal, no murmur, click, rub or gallop GI: soft, non-tender; bowel sounds normal; no masses,  no organomegaly Extremities: extremities normal, atraumatic, no cyanosis or edema  ECOG PERFORMANCE STATUS: 1 - Symptomatic but completely ambulatory  Blood pressure (!) 153/70, pulse 82, temperature 98.1 F (36.7 C), temperature source Oral, resp. rate 16, weight 160 lb 9.6 oz (72.8 kg), SpO2 90 %.  LABORATORY DATA: Lab Results  Component Value Date   WBC 6.0 03/13/2022   HGB 9.0 (L) 03/13/2022   HCT 29.8 (L) 03/13/2022   MCV 86.1 03/13/2022   PLT 248 03/13/2022      Chemistry      Component Value Date/Time   NA 140 02/13/2022 0933   NA 137 08/28/2021 0923   K 4.0 02/13/2022 0933   CL 106 02/13/2022 0933   CO2 26 02/13/2022 0933   BUN 25 (H) 02/13/2022 0933   BUN 20 08/28/2021 0923   CREATININE 1.31 (H) 02/13/2022 0933   CREATININE 1.08 04/18/2014 1516   GLU 104 12/23/2017 1111      Component Value Date/Time   CALCIUM 9.3 02/13/2022 0933   ALKPHOS 73 02/13/2022 0933   AST 11 (L) 02/13/2022 0933   ALT 7 02/13/2022 0933   BILITOT 0.3 02/13/2022 0933       RADIOGRAPHIC STUDIES: No results found.  ASSESSMENT AND PLAN: This is a very pleasant 86 years old white female with stage IIIA (T2a, N2, M0) non-small cell lung cancer, adenocarcinoma presented with left lower lobe lung mass in addition to left hilar and mediastinal lymphadenopathy diagnosed in November 2022 with no actionable mutations The patient is currently undergoing a course of concurrent chemotherapy with radiation.  Her chemotherapy is in the form of carboplatin for AUC of 2 and paclitaxel 45 Mg/M2 status post 6 cycles.  She has been tolerating her treatment with concurrent chemoradiation fairly well except for the radiation-induced odynophagia and dysphagia. Her scan showed significant improvement in  her disease with 65% reduction in the volume of the left lower lobe nodule.  She continues to have persistent left hilar and infrahilar adenopathy. The patient is currently undergoing treatment with consolidation immunotherapy with Imfinzi 1500 Mg IV every 4 weeks.  Status post 5 cycles. The patient has been tolerating her treatment fairly well with no concerning adverse effects. I recommended  for her to proceed with cycle #6 today as planned. I will see her back for follow-up visit in 4 weeks for evaluation before the next cycle of her treatment. The patient was advised to call immediately if she has any other concerning symptoms in the interval. The patient voices understanding of current disease status and treatment options and is in agreement with the current care plan.  All questions were answered. The patient knows to call the clinic with any problems, questions or concerns. We can certainly see the patient much sooner if necessary.  Disclaimer: This note was dictated with voice recognition software. Similar sounding words can inadvertently be transcribed and may not be corrected upon review.

## 2022-03-14 ENCOUNTER — Telehealth: Payer: Self-pay

## 2022-03-14 NOTE — Telephone Encounter (Signed)
This nurse reached out to patient per provider request and made her aware that one of her stool cards was positive for blood.  Informed that a referral was made to [facility] and they should be contacting her for an appointment. This nurse verified that patient is taking iron supplement.  She did state that she has not taken it for a couple days.  This nurse encouraged patient to continue taking supplement daily and take with Vitamin C such as orange juice for better absorption.  No further questions or concerns at this time.

## 2022-03-17 ENCOUNTER — Other Ambulatory Visit: Payer: Self-pay

## 2022-03-18 ENCOUNTER — Other Ambulatory Visit: Payer: Self-pay | Admitting: Physician Assistant

## 2022-03-18 DIAGNOSIS — E039 Hypothyroidism, unspecified: Secondary | ICD-10-CM

## 2022-03-19 DIAGNOSIS — C3490 Malignant neoplasm of unspecified part of unspecified bronchus or lung: Secondary | ICD-10-CM | POA: Diagnosis not present

## 2022-03-27 ENCOUNTER — Encounter: Payer: Self-pay | Admitting: Nurse Practitioner

## 2022-03-27 ENCOUNTER — Ambulatory Visit (INDEPENDENT_AMBULATORY_CARE_PROVIDER_SITE_OTHER): Payer: PPO | Admitting: Nurse Practitioner

## 2022-03-27 VITALS — BP 134/72 | HR 92 | Temp 98.2°F | Ht 66.0 in | Wt 155.0 lb

## 2022-03-27 DIAGNOSIS — E039 Hypothyroidism, unspecified: Secondary | ICD-10-CM | POA: Diagnosis not present

## 2022-03-27 DIAGNOSIS — Z9981 Dependence on supplemental oxygen: Secondary | ICD-10-CM

## 2022-03-27 DIAGNOSIS — J449 Chronic obstructive pulmonary disease, unspecified: Secondary | ICD-10-CM | POA: Diagnosis not present

## 2022-03-27 NOTE — Progress Notes (Signed)
I,Tianna Badgett,acting as a Education administrator for [employer], FNP.,have documented all relevant documentation on the behalf of [name], FNP,as directed by  [name], FNP while in the presence of [name], [place].  Subjective:     Patient ID: Abigail [name] , female    DOB: 1936/08/20 , 86 y.o.   MRN: [id number]   Chief Complaint  Patient presents with   COPD    HPI  Patient presents today for COPD follow up. She is doing well with the new inhaler, she is coughing up more secretions.  She is now wearing her oxygen all the time mostly at night. She will sit outside for 45 mins -1 hour with the dogs and will not wear. She has an appt with [facility] to evaluate for a smaller oxygen tank. She gets her flu vaccine at [facility] later in the year   COPD She complains of cough and sputum production. There is no shortness of breath. This is a chronic problem. The current episode started more than 1 year ago. The problem occurs intermittently. The cough is productive of sputum. Pertinent negatives include no appetite change or dyspnea on exertion. Her past medical history is significant for COPD.     Past Medical History:  Diagnosis Date   Anemia    Arthritis    knees, back   Carotid artery occlusion    Chronic kidney disease    stage 3 ckd no nephrologist   Complete uterine prolapse with prolapse of anterior vaginal wall    Complication of anesthesia    hard to wake up per pt   Constipation    Coronary artery disease    Diverticulitis yrs ago coialitis   Dyspnea    History of blood transfusion    History of radiation therapy    right lung 08/05/2021-09/18/2021  Dr Gery Pray   Hypertension    Hypothyroid    Numbness    in hands at times   Pneumonia    Pre-diabetes    Scoliosis    STEMI (ST elevation myocardial infarction) ([place]) 10/26/2019   DES RCA   Wears dentures    full dentures   Wears glasses    for reading     Family History  Problem Relation Age of Onset    Hypertension Mother      Current Outpatient Medications:    esomeprazole (NEXIUM) 20 MG capsule, Take 1 capsule (20 mg total) by mouth 2 (two) times daily before a meal., Disp: 60 capsule, Rfl: 3   hydrOXYzine (ATARAX) 10 MG tablet, Take 1 tablet (10 mg total) by mouth 3 (three) times daily as needed., Disp: 30 tablet, Rfl: 0   prochlorperazine (COMPAZINE) 10 MG tablet, Take 1 tablet (10 mg total) by mouth every 6 (six) hours as needed for nausea or vomiting., Disp: 30 tablet, Rfl: 2   acetaminophen (TYLENOL) 325 MG tablet, Take 2 tablets (650 mg total) by mouth every 6 (six) hours as needed for mild pain (or Fever >/= 101)., Disp: , Rfl:    albuterol (VENTOLIN HFA) 108 (90 Base) MCG/ACT inhaler, TAKE 2 PUFFS BY MOUTH EVERY 6 HOURS AS NEEDED FOR WHEEZE OR SHORTNESS OF BREATH, Disp: 8.5 each, Rfl: 1   aspirin EC 81 MG tablet, Take 81 mg by mouth at bedtime., Disp: , Rfl:    atorvastatin (LIPITOR) 80 MG tablet, Take 1 tablet (80 mg total) by mouth daily at 6 PM., Disp: 90 tablet, Rfl: 3   budesonide-formoterol (SYMBICORT) 80-4.5 MCG/ACT inhaler,  Inhale 2 puffs into the lungs 2 (two) times daily., Disp: 3 each, Rfl: 1   Cholecalciferol (VITAMIN D3 PO), Take 2 capsules by mouth daily. (Patient not taking: Reported on 02/13/2022), Disp: , Rfl:    Cranberry 1000 MG CAPS, Take 1,000 mg by mouth daily at 12 noon., Disp: , Rfl:    cyclobenzaprine (FLEXERIL) 10 MG tablet, Take 1 tablet (10 mg total) by mouth 3 (three) times daily as needed for muscle spasms., Disp: 30 tablet, Rfl: 1   docusate sodium (COLACE) 100 MG capsule, Take 100-200 mg by mouth See admin instructions. Take 100 mg in the morning and 200 mg at bedtime, Disp: , Rfl:    estradiol (ESTRACE) 0.1 MG/GM vaginal cream, Place 0.5 g vaginally once a week. Place 0.5g twice a week at opening of vagina, Disp: , Rfl:    ezetimibe (ZETIA) 10 MG tablet, Take 1 tablet (10 mg total) by mouth daily., Disp: 90 tablet, Rfl: 3   ferrous sulfate 300 (60 Fe)  MG/5ML syrup, Take 5 mLs (300 mg total) by mouth 2 (two) times daily with a meal., Disp: 300 mL, Rfl: 11   levothyroxine (SYNTHROID) 200 MCG tablet, PLEASE ALTERNATE 175 MCG AND 200 MG DOSE TABLET EVERY OTHER DAY, Disp: 30 tablet, Rfl: 1   lisinopril-hydrochlorothiazide (ZESTORETIC) 20-25 MG tablet, Take 0.5 tablets by mouth 3 (three) times a week. Take on Monday Wednesday and Friday only, Disp: 90 tablet, Rfl: 1   nitroGLYCERIN (NITROSTAT) 0.4 MG SL tablet, Place 1 tablet (0.4 mg total) under the tongue every 5 (five) minutes x 3 doses as needed for chest pain., Disp: 25 tablet, Rfl: 3   Trospium Chloride 60 MG CP24, Take 1 capsule (60 mg total) by mouth daily., Disp: 30 capsule, Rfl: 11   Allergies  Allergen Reactions   Codeine Nausea And Vomiting   Norvasc [Amlodipine] Rash and Other (See Comments)    rash     Review of Systems  Constitutional: Negative.  Negative for appetite change.  Respiratory:  Positive for cough and sputum production. Negative for shortness of breath.   Cardiovascular: Negative.  Negative for dyspnea on exertion.  Gastrointestinal: Negative.   Neurological: Negative.   Psychiatric/Behavioral: Negative.       Today's Vitals   03/27/22 1212  BP: 134/72  Pulse: 92  Temp: 98.2 F (36.8 C)  TempSrc: Oral  SpO2: 95%  Weight: 155 lb (70.3 kg)  Height: 5\' 6"  (1.676 m)   Body mass index is 25.02 kg/m.   Objective:  Physical Exam Vitals reviewed.  Constitutional:      General: She is not in acute distress.    Appearance: Normal appearance.  Cardiovascular:     Rate and Rhythm: Normal rate and regular rhythm.     Pulses: Normal pulses.     Heart sounds: Normal heart sounds. No murmur heard. Pulmonary:     Effort: Pulmonary effort is normal. No respiratory distress.     Breath sounds: Normal breath sounds. No wheezing.     Comments: Wearing supplemental oxygen Neurological:     General: No focal deficit present.     Mental Status: She is alert and  oriented to person, place, and time.     Cranial Nerves: No cranial nerve deficit.     Motor: No weakness.  Psychiatric:        Mood and Affect: Mood normal.        Behavior: Behavior normal.        Thought Content: Thought content  normal.        Judgment: Judgment normal.         Assessment And Plan:     1. Chronic obstructive pulmonary disease, unspecified COPD type ([place]) Comments: She has been doing well with the new inhaler.   2. Acquired hypothyroidism Comments: Stable, continue current medications  3. Supplemental oxygen dependent Comments: Continue as ordered by Oncology    Patient was given opportunity to ask questions. Patient verbalized understanding of the plan and was able to repeat key elements of the plan. All questions were answered to their satisfaction.  [name], FNP   I, [name], FNP, have reviewed all documentation for this visit. The documentation on 04/12/22 for the exam, diagnosis, procedures, and orders are all accurate and complete.   IF YOU HAVE BEEN REFERRED TO A SPECIALIST, IT MAY TAKE 1-2 WEEKS TO SCHEDULE/PROCESS THE REFERRAL. IF YOU HAVE NOT HEARD FROM US/SPECIALIST IN TWO WEEKS, PLEASE GIVE [place] A CALL AT [phone].   THE PATIENT IS ENCOURAGED TO PRACTICE SOCIAL DISTANCING DUE TO THE COVID-19 PANDEMIC.

## 2022-03-27 NOTE — Patient Instructions (Signed)
Chronic Obstructive Pulmonary Disease  Chronic obstructive pulmonary disease (COPD) is a long-term (chronic) lung problem. When you have COPD, it is hard for air to get in and out of your lungs. Usually the condition gets worse over time, and your lungs will never return to normal. There are things you can do to keep yourself as healthy as possible. What are the causes? Smoking. This is the most common cause. Certain genes passed from parent to child (inherited). What increases the risk? Being exposed to secondhand smoke from cigarettes, pipes, or cigars. Being exposed to chemicals and other irritants, such as fumes and dust in the work environment. Having chronic lung conditions or infections. What are the signs or symptoms? Shortness of breath, especially during physical activity. A long-term cough with a large amount of thick mucus. Sometimes, the cough may not have any mucus (dry cough). Wheezing. Breathing quickly. Skin that looks gray or blue, especially in the fingers, toes, or lips. Feeling tired (fatigue). Weight loss. Chest tightness. Having infections often. Episodes when breathing symptoms become much worse (exacerbations). At the later stages of this disease, you may have swelling in the ankles, feet, or legs. How is this treated? Taking medicines. Quitting smoking, if you smoke. Rehabilitation. This includes steps to make your body work better. It may involve a team of specialists. Doing exercises. Making changes to your diet. Using oxygen. Lung surgery. Lung transplant. Comfort measures (palliative care). Follow these instructions at home: Medicines Take over-the-counter and prescription medicines only as told by your doctor. Talk to your doctor before taking any cough or allergy medicines. You may need to avoid medicines that cause your lungs to be dry. Lifestyle If you smoke, stop smoking. Smoking makes the problem worse. Do not smoke or use any products that  contain nicotine or tobacco. If you need help quitting, ask your doctor. Avoid being around things that make your breathing worse. This may include smoke, chemicals, and fumes. Stay active, but remember to rest as well. Learn and use tips on how to manage stress and control your breathing. Make sure you get enough sleep. Most adults need at least 7 hours of sleep every night. Eat healthy foods. Eat smaller meals more often. Rest before meals. Controlled breathing Learn and use tips on how to control your breathing as told by your doctor. Try: Breathing in (inhaling) through your nose for 1 second. Then, pucker your lips and breath out (exhale) through your lips for 2 seconds. Putting one hand on your belly (abdomen). Breathe in slowly through your nose for 1 second. Your hand on your belly should move out. Pucker your lips and breathe out slowly through your lips. Your hand on your belly should move in as you breathe out.  Controlled coughing Learn and use controlled coughing to clear mucus from your lungs. Follow these steps: Lean your head a little forward. Breathe in deeply. Try to hold your breath for 3 seconds. Keep your mouth slightly open while coughing 2 times. Spit any mucus out into a tissue. Rest and do the steps again 1 or 2 times as needed. General instructions Make sure you get all the shots (vaccines) that your doctor recommends. Ask your doctor about a flu shot and a pneumonia shot. Use oxygen therapy and pulmonary rehabilitation if told by your doctor. If you need home oxygen therapy, ask your doctor if you should buy a tool to measure your oxygen level (oximeter). Make a COPD action plan with your doctor. This helps you   to know what to do if you feel worse than usual. Manage any other conditions you have as told by your doctor. Avoid going outside when it is very hot, cold, or humid. Avoid people who have a sickness you can catch (contagious). Keep all follow-up  visits. Contact a doctor if: You cough up more mucus than usual. There is a change in the color or thickness of the mucus. It is harder to breathe than usual. Your breathing is faster than usual. You have trouble sleeping. You need to use your medicines more often than usual. You have trouble doing your normal activities such as getting dressed or walking around the house. Get help right away if: You have shortness of breath while resting. You have shortness of breath that stops you from: Being able to talk. Doing normal activities. Your chest hurts for longer than 5 minutes. Your skin color is more blue than usual. Your pulse oximeter shows that you have low oxygen for longer than 5 minutes. You have a fever. You feel too tired to breathe normally. These symptoms may represent a serious problem that is an emergency. Do not wait to see if the symptoms will go away. Get medical help right away. Call your local emergency services (911 in the [place] Do not drive yourself to the hospital. Summary Chronic obstructive pulmonary disease (COPD) is a long-term lung problem. The way your lungs work will never return to normal. Usually the condition gets worse over time. There are things you can do to keep yourself as healthy as possible. Take over-the-counter and prescription medicines only as told by your doctor. If you smoke, stop. Smoking makes the problem worse. This information is not intended to replace advice given to you by your health care provider. Make sure you discuss any questions you have with your health care provider. Document Revised: 06/19/2020 Document Reviewed: 06/19/2020 [employer] Patient Education  2023 [employer]  

## 2022-04-04 NOTE — Progress Notes (Unsigned)
[facility] OFFICE PROGRESS NOTE  [name], Abigail [name] [id number]  DIAGNOSIS:  Stage IIIA (T2a, N2, M0) non-small cell lung cancer, adenocarcinoma presented with left lower lobe lung mass in addition to left hilar and mediastinal lymphadenopathy diagnosed in November 2022 with no actionable mutation on the guardant blood test  PRIOR THERAPY: Weekly concurrent chemoradiation with carboplatin for an AUC of 2 and paclitaxel 45 mg per metered squared.  First dose on 08/05/2021.  Status post 6 cycles.  Last dose was given on September 03, 2021.  CURRENT THERAPY: Consolidation treatment with immunotherapy with Imfinzi 1500 Mg IV every 4 weeks.  First dose October 23, 2021.  Status post 6 cycles.  INTERVAL HISTORY: Abigail [name] 86 y.o. female returns to the clinic today for a follow-up visit.  The patient is currently undergoing consolidation immunotherapy with Imfinzi.  She status post 6 cycles.  She has been tolerating it well without any adverse side effects.  The patient reports that a COVID booster and annual flu shot was recommended by her PCP.  She is wondering if it is okay for her to take that or if that is going to interfere with her cancer treatments at all.  The patient has some persistent baseline fatigue and weakness.  She also has anemia. She denies any known bleeding or bruising.  She had some stool cards performed to assess for GI blood loss.  The patient did have a positive stool card and was subsequently referred to GI.  She has not heard from their office at this time.  She also reports some cold intolerance from her anemia.  The patient is wondering if she can start exercising although she is on oxygen to help with her deconditioning.   Regarding her treatment with immunotherapy, the patient denies any fever, chills, or night sweats.  Weight and appetite is stable.  She reports stable dyspnea on exertion.  She is disappointed because she does  not like transporting her oxygen tank.  She was assessed for a smaller portable oxygen tank and "failed" test.  She recently was prescribed Symbicort inhaler.  She reports cough.  She states the cough is not constant.  Sometimes she will cough up clear phlegm.  She used to do still some and may resume taking this again.  She is unable to specify if her cough is worse than her baseline.  She reports her cough is significantly better from when she was first diagnosed.  She did mention that she blew her nose 1 morning and had blood-tinged mucus.  Later that day, she then coughed up mucus that had blood-tinged in it.  Unclear if this is secondary to postnasal drainage.  She sometimes has alternating diarrhea and constipation.  She takes a stool softener daily.  Denies any nausea or vomiting.  Denies any headache or visual changes.  Denies any rashes or skin changes.  She is here today for evaluation and repeat blood work before starting cycle #7.   MEDICAL HISTORY: Past Medical History:  Diagnosis Date   Anemia    Arthritis    knees, back   Carotid artery occlusion    Chronic kidney disease    stage 3 ckd no nephrologist   Complete uterine prolapse with prolapse of anterior vaginal wall    Complication of anesthesia    hard to wake up per pt   Constipation    Coronary artery disease    Diverticulitis yrs ago coialitis  Dyspnea    History of blood transfusion    History of radiation therapy    right lung 08/05/2021-09/18/2021  Dr Gery Pray   Hypertension    Hypothyroid    Numbness    in hands at times   Pneumonia    Pre-diabetes    Scoliosis    STEMI (ST elevation myocardial infarction) ([place]) 10/26/2019   DES RCA   Wears dentures    full dentures   Wears glasses    for reading    ALLERGIES:  is allergic to codeine and norvasc [amlodipine].  MEDICATIONS:  Current Outpatient Medications  Medication Sig Dispense Refill   acetaminophen (TYLENOL) 325 MG tablet Take 2 tablets (650  mg total) by mouth every 6 (six) hours as needed for mild pain (or Fever >/= 101).     albuterol (VENTOLIN HFA) 108 (90 Base) MCG/ACT inhaler TAKE 2 PUFFS BY MOUTH EVERY 6 HOURS AS NEEDED FOR WHEEZE OR SHORTNESS OF BREATH 8.5 each 1   aspirin EC 81 MG tablet Take 81 mg by mouth at bedtime.     atorvastatin (LIPITOR) 80 MG tablet Take 1 tablet (80 mg total) by mouth daily at 6 PM. 90 tablet 3   budesonide-formoterol (SYMBICORT) 80-4.5 MCG/ACT inhaler Inhale 2 puffs into the lungs 2 (two) times daily. 3 each 1   Cranberry 1000 MG CAPS Take 1,000 mg by mouth daily at 12 noon.     cyclobenzaprine (FLEXERIL) 10 MG tablet Take 1 tablet (10 mg total) by mouth 3 (three) times daily as needed for muscle spasms. 30 tablet 1   docusate sodium (COLACE) 100 MG capsule Take 100-200 mg by mouth See admin instructions. Take 100 mg in the morning and 200 mg at bedtime     esomeprazole (NEXIUM) 20 MG capsule Take 1 capsule (20 mg total) by mouth 2 (two) times daily before a meal. 60 capsule 3   estradiol (ESTRACE) 0.1 MG/GM vaginal cream Place 0.5 g vaginally once a week. Place 0.5g twice a week at opening of vagina     ezetimibe (ZETIA) 10 MG tablet Take 1 tablet (10 mg total) by mouth daily. 90 tablet 3   ferrous sulfate 300 (60 Fe) MG/5ML syrup Take 5 mLs (300 mg total) by mouth 2 (two) times daily with a meal. 300 mL 11   hydrOXYzine (ATARAX) 10 MG tablet Take 1 tablet (10 mg total) by mouth 3 (three) times daily as needed. 30 tablet 0   levothyroxine (SYNTHROID) 200 MCG tablet PLEASE ALTERNATE 175 MCG AND 200 MG DOSE TABLET EVERY OTHER DAY 30 tablet 1   lisinopril-hydrochlorothiazide (ZESTORETIC) 20-25 MG tablet Take 0.5 tablets by mouth 3 (three) times a week. Take on Monday Wednesday and Friday only 90 tablet 1   nitroGLYCERIN (NITROSTAT) 0.4 MG SL tablet Place 1 tablet (0.4 mg total) under the tongue every 5 (five) minutes x 3 doses as needed for chest pain. 25 tablet 3   prochlorperazine (COMPAZINE) 10 MG  tablet Take 1 tablet (10 mg total) by mouth every 6 (six) hours as needed for nausea or vomiting. 30 tablet 2   Trospium Chloride 60 MG CP24 Take 1 capsule (60 mg total) by mouth daily. 30 capsule 11   Cholecalciferol (VITAMIN D3 PO) Take 2 capsules by mouth daily. (Patient not taking: Reported on 02/13/2022)     No current facility-administered medications for this visit.    SURGICAL HISTORY:  Past Surgical History:  Procedure Laterality Date   ANTERIOR AND POSTERIOR REPAIR WITH SACROSPINOUS  FIXATION N/A 12/20/2020   Procedure: SACROSPINOUS LIGAMENT FIXATION;  Surgeon: [name], MD;  Location[facility];  Service: Gynecology;  Laterality: N/A;  Total time requested for all procedures is 2 hours   Robinson age 35   lower   bartholin cyst removal  age 63's   BLADDER SUSPENSION N/A 12/20/2020   Procedure: TRANSVAGINAL TAPE (TVT) PROCEDURE;  Surgeon: [name], MD;  [facility];  Service: Gynecology;  Laterality: N/A;   BRONCHIAL BRUSHINGS  07/15/2021   Procedure: BRONCHIAL BRUSHINGS;  Surgeon: Collene Gobble, MD;  Location: [facility];  Service: Pulmonary;;   BRONCHIAL NEEDLE ASPIRATION BIOPSY  07/15/2021   Procedure: BRONCHIAL NEEDLE ASPIRATION BIOPSIES;  Surgeon: Collene Gobble, MD;  Location: [facility] ENDOSCOPY;  Service: Pulmonary;;   CORONARY/GRAFT ACUTE MI REVASCULARIZATION N/A 10/26/2019   Procedure: Coronary/Graft Acute MI Revascularization;  Surgeon: Sherren Mocha, MD;  Location: [facility] CV [facility];  Service: Cardiovascular;  Laterality: N/A;   CYSTOCELE REPAIR N/A 12/20/2020   Procedure: ANTERIOR AND POSTERIOR REPAIR WITH PERINEORRHAPHY;  Surgeon: [name], MD;  Location: [facility];  Service: Gynecology;  Laterality: N/A;   CYSTOSCOPY N/A 12/20/2020   Procedure: CYSTOSCOPY;  Surgeon: [name], MD;  Location: [facility];  Service: Gynecology;   Laterality: N/A;   ELBOW SURGERY  1990's   left   ENDOBRONCHIAL ULTRASOUND N/A 07/15/2021   Procedure: ENDOBRONCHIAL ULTRASOUND;  Surgeon: Collene Gobble, MD;  Location: [facility] ENDOSCOPY;  Service: Pulmonary;  Laterality: N/A;   HEMOSTASIS CONTROL  07/15/2021   Procedure: HEMOSTASIS CONTROL;  Surgeon: Collene Gobble, MD;  Location: [facility] ENDOSCOPY;  Service: Pulmonary;;   LEFT HEART CATH AND CORONARY ANGIOGRAPHY N/A 10/26/2019   Procedure: LEFT HEART CATH AND CORONARY ANGIOGRAPHY;  Surgeon: Sherren Mocha, MD;  Location: [facility];  Service: Cardiovascular;  Laterality: N/A;   TONSILLECTOMY      REVIEW OF SYSTEMS:   Constitutional: Positive for chills/cold.  Onset of her fatigue.  Negative for appetite change, fever and unexpected weight change.  HENT:   Negative for mouth sores, nosebleeds, sore throat and trouble swallowing.   Eyes: Negative for eye problems and icterus.  Respiratory: Positive for occasional intermittent cough and Shortness of breath. Negative for hemoptysis and wheezing.   Cardiovascular: Negative for chest pain and leg swelling.  Gastrointestinal: Positive for alternating diarrhea and constipation.  Negative for abdominal pain, nausea and vomiting.  Genitourinary: Negative for bladder incontinence, difficulty urinating, dysuria, frequency and hematuria.   Musculoskeletal: Negative for back pain, gait problem, neck pain and neck stiffness.  Skin: Negative for itching and rash.  Neurological: Negative for dizziness, extremity weakness, gait problem, headaches, light-headedness and seizures.  Hematological: Negative for adenopathy. Does not bruise/bleed easily.  Psychiatric/Behavioral: Negative for confusion, depression and sleep disturbance. The patient is not nervous/anxious.    PHYSICAL EXAMINATION:  Blood pressure 138/61, pulse 80, temperature 98.4 F (36.9 C), temperature source Oral, resp. rate 15, height 5\' 6"  (1.676 m), weight 156 lb 4.8 oz (70.9 kg), SpO2  99 %.  ECOG PERFORMANCE STATUS: 2  Physical Exam  Constitutional: Oriented to person, place, and time and well-developed, well-nourished, and in no distress.  HENT:  Head: Normocephalic and atraumatic.  Mouth/Throat: Oropharynx is clear and moist. No oropharyngeal exudate.  Eyes: Conjunctivae are normal. Right eye exhibits no discharge. Left eye exhibits no discharge. No scleral icterus.  Neck: Normal range of motion. Neck supple.  Cardiovascular:  Normal rate, regular rhythm, normal heart sounds and intact distal pulses.   Pulmonary/Chest: Effort normal and breath sounds normal. No respiratory distress. No rales.  On supplemental oxygen via nasal cannula. Abdominal: Soft. Bowel sounds are normal. Exhibits no distension and no mass. There is no tenderness.  Musculoskeletal: Normal range of motion. Exhibits no edema.  Lymphadenopathy: No cervical adenopathy.  Neurological: Alert and oriented to person, place, and time. Examined in the wheelchair. Exhibits muscle wasting.  Skin: Skin is warm and dry. No rash noted. Not diaphoretic. No erythema. No pallor.  Psychiatric: Mood, memory and judgment normal.  Vitals reviewed.  LABORATORY DATA: Lab Results  Component Value Date   WBC 6.5 04/10/2022   HGB 9.0 (L) 04/10/2022   HCT 29.6 (L) 04/10/2022   MCV 85.3 04/10/2022   PLT 313 04/10/2022      Chemistry      Component Value Date/Time   NA 136 04/10/2022 0941   NA 137 08/28/2021 0923   K 4.1 04/10/2022 0941   CL 102 04/10/2022 0941   CO2 27 04/10/2022 0941   BUN 28 (H) 04/10/2022 0941   BUN 20 08/28/2021 0923   CREATININE 1.31 (H) 04/10/2022 0941   CREATININE 1.08 04/18/2014 1516   GLU 104 12/23/2017 1111      Component Value Date/Time   CALCIUM 9.3 04/10/2022 0941   ALKPHOS 77 04/10/2022 0941   AST 11 (L) 04/10/2022 0941   ALT 8 04/10/2022 0941   BILITOT 0.3 04/10/2022 0941       RADIOGRAPHIC STUDIES:  No results found.   ASSESSMENT/PLAN:  This is a very pleasant  86 year old Caucasian female diagnosed with stage IIIa (T2a, N2, M0) non-small cell lung cancer, adenocarcinoma.  The patient presented with a left lower lobe lung mass in addition to left hilar mediastinal lymphadenopathy.  She was diagnosed in November 2022.  The patient did not have any actionable mutations by guardant 360 blood test.    The patient completed a course of concurrent chemotherapy with radiation.  Her chemotherapy is in the form of carboplatin for AUC of 2 and paclitaxel 45 Mg/M2 status post 6 cycles.  She had been tolerating her treatment with concurrent chemoradiation fairly well except for the radiation-induced odynophagia and dysphagia which has since resolved.    Her initial scan showed significant improvement in her disease with 65% reduction in the volume of the left lower lobe nodule.  She continued to have persistent left hilar and infrahilar adenopathy.  The patient is currently undergoing treatment with consolidation immunotherapy with Imfinzi 1500 Mg IV every 4 weeks.  Status post 6 cycles.    Labs were reviewed.  Recommend that she proceed with cycle #7 today's schedule.  I will arrange for restaging CT scan the chest prior to starting her next cycle of treatment.   For her cough, the patient is can start using Delsym again.  Additionally, encouraged to use Mucinex for her mucus production.  I did offer the patient a chest x-ray today for her cough if she feels like it is worse than her baseline.  She states her cough is not all the time and is improved compared to when she started treatment.  Encouraged her she develops any worsening symptoms such as fevers, night sweats, sore throat, worsening productive cough, shortness of breath, etc. that we can always arrange for a same-day chest x-ray to rule out infection.  We will hold off on arranging for chest x-ray at this time.  The patient has had  persistent anemia despite not being on chemotherapy for several months.  She  had stool cards performed to assess for GI blood loss which 1 of 3 was positive.  She was referred to GI.  She has not heard anything from their office.  We will follow-up on this referral.   The patient will continue to use her supplemental oxygen which was prescribed by myself a couple weeks ago due to low oxygen.  The patient was assessed for a portable oxygen tank.  Unfortunately she did not pass this test.  I did encourage the patient to have routine health maintenance such as flu shots and COVID vaccines as indicated by the CDC.  Encouraged the patient to have these performed in the middle of her cycles of treatment.  I did encourage the patient that exercise is okay as long as she is being safe.  She asked if she can go on a treadmill which I stated can be dangerous.  I would be worried that she would fall.  It may be better to use a stationary bike, foot pedals, or walking.   The patient was advised to call immediately if she has any concerning symptoms in the interval. The patient voices understanding of current disease status and treatment options and is in agreement with the current care plan. All questions were answered. The patient knows to call the clinic with any problems, questions or concerns. We can certainly see the patient much sooner if necessary          Orders Placed This Encounter  Procedures   CT Chest W Contrast    Standing Status:   Future    Standing Expiration Date:   04/10/2023    Order Specific Question:   If indicated for the ordered procedure, I authorize the administration of contrast media per Radiology protocol    Answer:   Yes    Order Specific Question:   Preferred imaging location?    Answer:   [facility]     The total time spent in the appointment was 20-29 minutes  Sheena Donegan L Kaja Jackowski, PA-C 04/10/22

## 2022-04-10 ENCOUNTER — Other Ambulatory Visit: Payer: Self-pay | Admitting: Interventional Cardiology

## 2022-04-10 ENCOUNTER — Other Ambulatory Visit: Payer: Self-pay | Admitting: Internal Medicine

## 2022-04-10 ENCOUNTER — Inpatient Hospital Stay: Payer: PPO

## 2022-04-10 ENCOUNTER — Inpatient Hospital Stay: Payer: PPO | Attending: Internal Medicine | Admitting: Physician Assistant

## 2022-04-10 ENCOUNTER — Other Ambulatory Visit: Payer: Self-pay

## 2022-04-10 VITALS — BP 138/61 | HR 80 | Temp 98.4°F | Resp 15 | Ht 66.0 in | Wt 156.3 lb

## 2022-04-10 DIAGNOSIS — C3490 Malignant neoplasm of unspecified part of unspecified bronchus or lung: Secondary | ICD-10-CM

## 2022-04-10 DIAGNOSIS — Z5112 Encounter for antineoplastic immunotherapy: Secondary | ICD-10-CM | POA: Diagnosis not present

## 2022-04-10 DIAGNOSIS — D649 Anemia, unspecified: Secondary | ICD-10-CM | POA: Insufficient documentation

## 2022-04-10 DIAGNOSIS — Z79899 Other long term (current) drug therapy: Secondary | ICD-10-CM | POA: Insufficient documentation

## 2022-04-10 DIAGNOSIS — C3432 Malignant neoplasm of lower lobe, left bronchus or lung: Secondary | ICD-10-CM | POA: Insufficient documentation

## 2022-04-10 LAB — CBC WITH DIFFERENTIAL (CANCER CENTER ONLY)
Abs Immature Granulocytes: 0.02 10*3/uL (ref 0.00–0.07)
Basophils Absolute: 0 10*3/uL (ref 0.0–0.1)
Basophils Relative: 1 %
Eosinophils Absolute: 0.2 10*3/uL (ref 0.0–0.5)
Eosinophils Relative: 3 %
HCT: 29.6 % — ABNORMAL LOW (ref 36.0–46.0)
Hemoglobin: 9 g/dL — ABNORMAL LOW (ref 12.0–15.0)
Immature Granulocytes: 0 %
Lymphocytes Relative: 4 %
Lymphs Abs: 0.3 10*3/uL — ABNORMAL LOW (ref 0.7–4.0)
MCH: 25.9 pg — ABNORMAL LOW (ref 26.0–34.0)
MCHC: 30.4 g/dL (ref 30.0–36.0)
MCV: 85.3 fL (ref 80.0–100.0)
Monocytes Absolute: 0.6 10*3/uL (ref 0.1–1.0)
Monocytes Relative: 10 %
Neutro Abs: 5.4 10*3/uL (ref 1.7–7.7)
Neutrophils Relative %: 82 %
Platelet Count: 313 10*3/uL (ref 150–400)
RBC: 3.47 MIL/uL — ABNORMAL LOW (ref 3.87–5.11)
RDW: 17.6 % — ABNORMAL HIGH (ref 11.5–15.5)
WBC Count: 6.5 10*3/uL (ref 4.0–10.5)
nRBC: 0 % (ref 0.0–0.2)

## 2022-04-10 LAB — CMP (CANCER CENTER ONLY)
ALT: 8 U/L (ref 0–44)
AST: 11 U/L — ABNORMAL LOW (ref 15–41)
Albumin: 3.9 g/dL (ref 3.5–5.0)
Alkaline Phosphatase: 77 U/L (ref 38–126)
Anion gap: 7 (ref 5–15)
BUN: 28 mg/dL — ABNORMAL HIGH (ref 8–23)
CO2: 27 mmol/L (ref 22–32)
Calcium: 9.3 mg/dL (ref 8.9–10.3)
Chloride: 102 mmol/L (ref 98–111)
Creatinine: 1.31 mg/dL — ABNORMAL HIGH (ref 0.44–1.00)
GFR, Estimated: 40 mL/min — ABNORMAL LOW (ref >60)
Glucose, Bld: 161 mg/dL — ABNORMAL HIGH (ref 70–99)
Potassium: 4.1 mmol/L (ref 3.5–5.1)
Sodium: 136 mmol/L (ref 135–145)
Total Bilirubin: 0.3 mg/dL (ref 0.3–1.2)
Total Protein: 7.7 g/dL (ref 6.5–8.1)

## 2022-04-10 LAB — TSH: TSH: 0.438 u[IU]/mL (ref 0.350–4.500)

## 2022-04-10 MED ORDER — SODIUM CHLORIDE 0.9 % IV SOLN
1500.0000 mg | Freq: Once | INTRAVENOUS | Status: AC
  Administered 2022-04-10: 1500 mg via INTRAVENOUS
  Filled 2022-04-10: qty 30

## 2022-04-10 MED ORDER — SODIUM CHLORIDE 0.9 % IV SOLN: Freq: Once | INTRAVENOUS | Status: AC

## 2022-04-10 NOTE — Patient Instructions (Signed)
[facility]  Discharge Instructions: Thank you for choosing [facility] to provide your oncology and hematology care.   If you have a lab appointment with the [facility], please go directly to the [facility] and check in at the registration area.   Wear comfortable clothing and clothing appropriate for easy access to any Portacath or PICC line.   We strive to give you quality time with your provider. You may need to reschedule your appointment if you arrive late (15 or more minutes).  Arriving late affects you and other patients whose appointments are after yours.  Also, if you miss three or more appointments without notifying the office, you may be dismissed from the clinic at the provider's discretion.      For prescription refill requests, have your pharmacy contact our office and allow 72 hours for refills to be completed.    Today you received the following chemotherapy and/or immunotherapy agents: durvalumab      To help prevent nausea and vomiting after your treatment, we encourage you to take your nausea medication as directed.  BELOW ARE SYMPTOMS THAT SHOULD BE REPORTED IMMEDIATELY: *FEVER GREATER THAN 100.4 F (38 C) OR HIGHER *CHILLS OR SWEATING *NAUSEA AND VOMITING THAT IS NOT CONTROLLED WITH YOUR NAUSEA MEDICATION *UNUSUAL SHORTNESS OF BREATH *UNUSUAL BRUISING OR BLEEDING *URINARY PROBLEMS (pain or burning when urinating, or frequent urination) *BOWEL PROBLEMS (unusual diarrhea, constipation, pain near the anus) TENDERNESS IN MOUTH AND THROAT WITH OR WITHOUT PRESENCE OF ULCERS (sore throat, sores in mouth, or a toothache) UNUSUAL RASH, SWELLING OR PAIN  UNUSUAL VAGINAL DISCHARGE OR ITCHING   Items with * indicate a potential emergency and should be followed up as soon as possible or go to the Emergency Department if any problems should occur.  Please show the CHEMOTHERAPY ALERT CARD or IMMUNOTHERAPY ALERT CARD at check-in to  the Emergency Department and triage nurse.  Should you have questions after your visit or need to cancel or reschedule your appointment, please contact [facility]  Dept: [phone]  and follow the prompts.  Office hours are 8:00 a.m. to 4:30 p.m. Monday - Friday. Please note that voicemails left after 4:00 p.m. may not be returned until the following business day.  We are closed weekends and major holidays. You have access to a nurse at all times for urgent questions. Please call the main number to the clinic Dept: [phone] and follow the prompts.   For any non-urgent questions, you may also contact your provider using MyChart. We now offer e-Visits for anyone 55 and older to request care online for non-urgent symptoms. For details visit [email or web address].   Also download the MyChart app! Go to the app store, search "MyChart", open the app, select [facility], and log in with your MyChart username and password.  Masks are optional in the cancer centers. If you would like for your care team to wear a mask while they are taking care of you, please let them know. For doctor visits, patients may have with them one support person who is at least 84 years old. At this time, visitors are not allowed in the infusion area.

## 2022-04-11 ENCOUNTER — Telehealth: Payer: Self-pay | Admitting: Internal Medicine

## 2022-04-11 NOTE — Telephone Encounter (Signed)
Pt's pharmacy is requesting a refill on ferrous sulfate. Would Dr. Irish Lack like to refill this medication? Please address

## 2022-04-11 NOTE — Telephone Encounter (Signed)
Scheduled appointment per 8/17 los. Patient is aware. 

## 2022-04-14 ENCOUNTER — Ambulatory Visit: Payer: Self-pay

## 2022-04-14 NOTE — Patient Outreach (Signed)
  Care Coordination   Follow Up Visit Note   04/14/2022 Name: Abigail [name] MRN: [id number] DOB: Nov 30, 1935  Abigail [name] is a 86 y.o. year old female who sees [name], [place] for primary care. I spoke with  [name] by phone today  What matters to the patients health and wellness today?  Patient needs help with transportation.     Goals Addressed       Patient Stated     I need help with transportation (pt-stated)        Care Coordination Interventions: Assessed patient understanding of cancer diagnosis and recommended treatment plan, Reviewed upcoming provider appointments and treatment appointments, Assessed available transportation to appointments and treatments. Has consistent/reliable transportation: No, Assessed support system. Has consistent/reliable family or other support: Yes, Referred to licensed clinical social work for transportation, and Nutrition assessment performed Determined patient has been approved for Meals on Wheels, she is unsure when she will receive her first meal      SDOH assessments and interventions completed:  Yes     Care Coordination Interventions Activated:  Yes  Care Coordination Interventions:  Yes, provided   Follow up plan: Referral made to [facility]  Follow up call scheduled for 06/13/22 @09 :00 AM     Encounter Outcome:  Pt. Visit Completed

## 2022-04-14 NOTE — Patient Instructions (Signed)
Visit Information  Thank you for taking time to visit with me today. Please don't hesitate to contact me if I can be of assistance to you.   Following are the goals we discussed today:   Goals Addressed       Patient Stated     I need help with transportation (pt-stated)        Care Coordination Interventions: Assessed patient understanding of cancer diagnosis and recommended treatment plan, Reviewed upcoming provider appointments and treatment appointments, Assessed available transportation to appointments and treatments. Has consistent/reliable transportation: No, Assessed support system. Has consistent/reliable family or other support: Yes, Referred to licensed clinical social work for transportation, and Nutrition assessment performed Determined patient has been approved for Meals on Wheels, she is unsure when she will receive her first meal    Our next appointment is by telephone on 06/13/22 at 09:00 AM   Please call the care guide team at [phone] if you need to cancel or reschedule your appointment.   If you are experiencing a Mental Health or [place] or need someone to talk to, please call 1-800-273-TALK (toll free, 24 hour hotline)  Patient verbalizes understanding of instructions and care plan provided today and agrees to view in [place]. Active MyChart status and patient understanding of how to access instructions and care plan via MyChart confirmed with patient.     Barb Merino, RN, BSN, CCM Care Management Coordinator [facility] Care Management  Direct Phone: [phone]

## 2022-04-14 NOTE — Progress Notes (Signed)
This encounter was created in error - please disregard.

## 2022-04-15 ENCOUNTER — Encounter: Payer: Self-pay | Admitting: Internal Medicine

## 2022-04-15 ENCOUNTER — Ambulatory Visit (INDEPENDENT_AMBULATORY_CARE_PROVIDER_SITE_OTHER): Payer: PPO | Admitting: Internal Medicine

## 2022-04-15 ENCOUNTER — Other Ambulatory Visit: Payer: Self-pay | Admitting: Internal Medicine

## 2022-04-15 ENCOUNTER — Ambulatory Visit: Payer: Self-pay

## 2022-04-15 VITALS — BP 124/70 | HR 76 | Ht 66.0 in | Wt 154.8 lb

## 2022-04-15 DIAGNOSIS — D509 Iron deficiency anemia, unspecified: Secondary | ICD-10-CM

## 2022-04-15 DIAGNOSIS — R195 Other fecal abnormalities: Secondary | ICD-10-CM | POA: Diagnosis not present

## 2022-04-15 MED ORDER — FERROUS SULFATE 300 (60 FE) MG/5ML PO SYRP: 300.0000 mg | ORAL_SOLUTION | Freq: Two times a day (BID) | ORAL | 11 refills | Status: DC

## 2022-04-15 NOTE — Telephone Encounter (Signed)
Please advise, thank you.

## 2022-04-15 NOTE — Patient Instructions (Signed)
Visit Information  Thank you for taking time to visit with me today. Please don't hesitate to contact me if I can be of assistance to you.   Following are the goals we discussed today:   Goals Addressed             This Visit's Progress    COMPLETED: Care Coordination Activities - Transportation       Care Coordination Interventions: Collaboration with RN Care Manager who indicated patient is in need of transportation resources Telephonic visit completed with the patient who advises she has transportation for provider appointments but needs options for grocery shopping and hair appointments Educated the patient on [facility] [employer] program advising SW can assist with application process Determined the patient is unsure if she will want to proceed with application process considering she has oxygen and the places she visits are less than two blocks from her home Reviewed the drivers with [facility] are trained and will be able to assist the patient as needed with getting on/off the bus with her oxygen Discussed the patients son is sometimes available to assist as long as he is not at work Provided patient with SW contact number; patient to contact SW if she is interested in pursuing transportation via [employer] with Consulting civil engineer to advise of interventions and plan for patient to contact SW as needed          Please call the care guide team at [phone] if you need to schedule an appointment with me  If you are experiencing a Mental Health or [place] or need someone to talk to, please call 1-800-273-TALK (toll free, 24 hour hotline)  The patient verbalized understanding of instructions, educational materials, and care plan provided today and DECLINED offer to receive copy of patient instructions, educational materials, and care plan.   No further follow up required: Please contact me if you would like assistance  applying for transportation via [place], [place], CDP Social Worker, Certified Dementia Practitioner Care Coordination [phone]

## 2022-04-15 NOTE — Patient Outreach (Signed)
  Care Coordination   Follow Up Visit Note   04/15/2022 Name: Abigail [name] MRN: [id number] DOB: 1936-02-27  Abigail [name] is a 86 y.o. year old female who sees [name], [facility] for primary care. I spoke with  [name] by phone today  What matters to the patients health and wellness today?  I would like transportation options for grocery shopping    Goals Addressed             This Visit's Progress    COMPLETED: Care Coordination Activities - Transportation       Care Coordination Interventions: Collaboration with RN Care Manager who indicated patient is in need of transportation resources Telephonic visit completed with the patient who advises she has transportation for provider appointments but needs options for grocery shopping and hair appointments Educated the patient on [employer] program advising SW can assist with application process Determined the patient is unsure if she will want to proceed with application process considering she has oxygen and the places she visits are less than two blocks from her home Reviewed the drivers with [facility] are trained and will be able to assist the patient as needed with getting on/off the bus with her oxygen Discussed the patients son is sometimes available to assist as long as he is not at work Provided patient with SW contact number; patient to contact SW if she is interested in pursuing transportation via Opt[employer], dispensing with Consulting civil engineer to advise of interventions and plan for patient to contact SW as needed         SDOH assessments and interventions completed:  No     Care Coordination Interventions Activated:  Yes  Care Coordination Interventions:  Yes, provided   Follow up plan: No further intervention required.   Encounter Outcome:  Pt. Visit Completed   Daneen Schick, BSW, CDP Social Worker, Certified Dementia Practitioner Care Coordination [phone]

## 2022-04-15 NOTE — Progress Notes (Signed)
Chief Complaint: IDA  HPI : 86 year old with history of lung adenocarcinoma s/p chemoradiation on [place] oxygen, CAD, CKD presents for follow up of IDA and positive FOBT.  Interval History: Patient recently had a stool cards checked in 02/2022 that showed 1 out of 3 positive FOBTs. Denies hematochezia. Endorses black stools on the iron tablets. She did run out of her oral iron tablets. Denies dark tarry stools as far as she can tell. She has occasionally had to get pRBCs over the last year, most recently on 02/13/22. Gets constipated on occasion. She is still undergoing treatments for lung cancer. She used to have a swallowing issue, which have improved after her radiation therapy was stopped.   Wt Readings from Last 3 Encounters:  04/15/22 154 lb 12.8 oz (70.2 kg)  04/10/22 156 lb 4.8 oz (70.9 kg)  03/27/22 155 lb (70.3 kg)    Current Outpatient Medications  Medication Sig Dispense Refill   acetaminophen (TYLENOL) 325 MG tablet Take 2 tablets (650 mg total) by mouth every 6 (six) hours as needed for mild pain (or Fever >/= 101).     albuterol (VENTOLIN HFA) 108 (90 Base) MCG/ACT inhaler TAKE 2 PUFFS BY MOUTH EVERY 6 HOURS AS NEEDED FOR WHEEZE OR SHORTNESS OF BREATH 8.5 each 1   aspirin EC 81 MG tablet Take 81 mg by mouth at bedtime.     atorvastatin (LIPITOR) 80 MG tablet Take 1 tablet (80 mg total) by mouth daily at 6 PM. 90 tablet 3   budesonide-formoterol (SYMBICORT) 80-4.5 MCG/ACT inhaler Inhale 2 puffs into the lungs 2 (two) times daily. 3 each 1   Cranberry 1000 MG CAPS Take 1,000 mg by mouth daily at 12 noon.     cyclobenzaprine (FLEXERIL) 10 MG tablet Take 1 tablet (10 mg total) by mouth 3 (three) times daily as needed for muscle spasms. 30 tablet 1   docusate sodium (COLACE) 100 MG capsule Take 100-200 mg by mouth See admin instructions. Take 100 mg in the morning and 200 mg at bedtime     esomeprazole (NEXIUM) 20 MG capsule Take 1 capsule (20 mg total) by mouth 2 (two) times daily  before a meal. 60 capsule 3   estradiol (ESTRACE) 0.1 MG/GM vaginal cream Place 0.5 g vaginally once a week. Place 0.5g twice a week at opening of vagina     ezetimibe (ZETIA) 10 MG tablet Take 1 tablet (10 mg total) by mouth daily. 90 tablet 3   ferrous sulfate 300 (60 Fe) MG/5ML syrup Take 5 mLs (300 mg total) by mouth 2 (two) times daily with a meal. 300 mL 11   hydrOXYzine (ATARAX) 10 MG tablet Take 1 tablet (10 mg total) by mouth 3 (three) times daily as needed. 30 tablet 0   levothyroxine (SYNTHROID) 200 MCG tablet PLEASE ALTERNATE 175 MCG AND 200 MG DOSE TABLET EVERY OTHER DAY 30 tablet 1   lisinopril-hydrochlorothiazide (ZESTORETIC) 20-25 MG tablet Take 0.5 tablets by mouth 3 (three) times a week. Take on Monday Wednesday and Friday only 90 tablet 1   nitroGLYCERIN (NITROSTAT) 0.4 MG SL tablet Place 1 tablet (0.4 mg total) under the tongue every 5 (five) minutes x 3 doses as needed for chest pain. 25 tablet 3   prochlorperazine (COMPAZINE) 10 MG tablet Take 1 tablet (10 mg total) by mouth every 6 (six) hours as needed for nausea or vomiting. 30 tablet 2   Trospium Chloride 60 MG CP24 Take 1 capsule (60 mg total) by mouth daily.  30 capsule 11   Cholecalciferol (VITAMIN D3 PO) Take 2 capsules by mouth daily. (Patient not taking: Reported on 02/13/2022)     No current facility-administered medications for this visit.   Review of Systems: All systems reviewed and negative except where noted in HPI.   Physical Exam: BP 124/70   Pulse 76   Ht 5\' 6"  (1.676 m)   Wt 154 lb 12.8 oz (70.2 kg)   BMI 24.99 kg/m  Constitutional: Pleasant female HEENT: Normocephalic and atraumatic. Conjunctivae are normal. No scleral icterus. Cardiovascular: Normal rate, regular rhythm.  Pulmonary/chest: Effort normal. On [place] oxygen. Abdominal: Soft, nondistended, nontender. Extremities: No edema Neurological: Alert and oriented to person place and time. Skin: Skin is warm and dry. No rashes  noted. Psychiatric: Normal mood and affect. Behavior is normal.  Labs 03/2021: Ferritin 34  Labs 08/2021: CBC with low WBC 1.3 (ANC 0.8), low Hb 9.5 (from 10.0 previously), platelets 160. CMP with elevated Cr of 1.56 and elevated glucose.   Labs 02/2022: Ferritin is low at 15.  Labs 03/2022: CBC with Hb of 9.  PET scan 07/09/21: IMPRESSION: 1. Hypermetabolic 3.2 cm superior segment left lower lobe pulmonary mass consistent with primary lung neoplasm. Associated left hilar and mediastinal metastatic adenopathy. 2. No findings for pulmonary metastatic disease, abdominal/pelvic metastatic disease or osseous metastatic disease. 3. Stable advanced vascular disease and emphysema.  Colonoscopy 12/01/03: IMPRESSION:  1. Multiple colonic polyps removed by snare polypectomy and one removed by     cold biopsy forceps  2. Extensive sigmoid diverticulosis with a few scattered diverticula around     the hepatic flexure.  3. Visceral hypersensitivity indicated by abdominal discomfort while insufflating air in the colon, question IBS. Path: 1. ENDOSCOPIC BIOPSY, LEFT COLON POLYP:   - ADENOMATOUS POLYP(S). NO HIGH GRADE DYSPLASIA OR   INVASIVE MALIGNANCY IDENTIFIED.   - HYPERPLASTIC POLYP(S). NO ADENOMATOUS CHANGE OR   MALIGNANCY IDENTIFIED.   - SEE COMMENT   2. ENDOSCOPIC BIOPSY, RECTAL POLYP: HYPERPLASTIC POLYP(S). NO   ADENOMATOUS CHANGE OR MALIGNANCY IDENTIFIED.   COMMENT   1. The larger polyp is an adenomatous polyp. The margins appear   free of adenomatous change.   ASSESSMENT AND PLAN:  IDA Positive FOBT Patient has been stable from a GI standpoint. She still has IDA and has been taking her PO iron. She ran out of her PO iron so will go ahead and refill this today. Based upon the patient's age and comorbidities, I think that the risks of endoscopic evaluation for her IDA and positive FOBT outweigh the benefits. Patient has NSCLC for which she was treated with chemoradiation and  immunotherapy, and she is currently on [place] oxygen. Patient would also prefer to not get the procedures if possible. Patient does not have any gross evidence of bleeding and her Hb has remained relatively stable with her last transfusion in 01/2022 (the one prior to that was in 06/2021). I will message her hem/onc provider to see if they would consider IV iron infusions to try to boost her iron levels further. She is already on PPI BID. If patient develops evidence of gross GI bleeding or has further evidence of Hb drops in the future, EGD/colonoscopy can be re-considered at that time. - Continue PPI BID.  - Refilled PO iron - Will message Ms. Heilingoetter for consideration of IV iron infusions  Christia Reading, MD

## 2022-04-15 NOTE — Telephone Encounter (Signed)
Chart indicates this was filled today by Dr Lorenso Courier

## 2022-04-15 NOTE — Patient Instructions (Addendum)
We have sent the following medications to your pharmacy for you to pick up at your convenience: Ferrous Sulfate 5 ml twice daily.   Follow up as needed.  _______________________________________________________  If you are age 84 or older, your body mass index should be between 23-30. Your Body mass index is 24.99 kg/m. If this is out of the aforementioned range listed, please consider follow up with your Primary Care Provider.  If you are age 62 or younger, your body mass index should be between 19-25. Your Body mass index is 24.99 kg/m. If this is out of the aformentioned range listed, please consider follow up with your Primary Care Provider.   ________________________________________________________  The [place] [facility] providers would like to encourage you to use [facility] to communicate with providers for non-urgent requests or questions.  Due to long hold times on the telephone, sending your provider a message by [facility] may be a faster and more efficient way to get a response.  Please allow 48 business hours for a response.  Please remember that this is for non-urgent requests.  _______________________________________________________

## 2022-04-19 DIAGNOSIS — C3490 Malignant neoplasm of unspecified part of unspecified bronchus or lung: Secondary | ICD-10-CM | POA: Diagnosis not present

## 2022-04-21 ENCOUNTER — Other Ambulatory Visit: Payer: Self-pay | Admitting: Physician Assistant

## 2022-04-21 DIAGNOSIS — D509 Iron deficiency anemia, unspecified: Secondary | ICD-10-CM | POA: Insufficient documentation

## 2022-04-21 NOTE — Progress Notes (Signed)
I called the patient to discuss the option of IV iron infusions vs p.o. iron. She reports she was having a hard time getting liquid IV iron due to supply issues at her pharmacy. She is currently taking iron tablets. She would like to get IV iron infusions starting on 05/08/22 when she is due to come in for her imfinzi. I have placed the orders for her to receive venofer weekly x3. I also have sent a scheduling message to adjust the duration of her infusion on 9/14 to account for her the additional time this will add. Additionally, the patient is supposed to have a restaging CT scan around 9/11. I gave her the number to the scan department with instructions how to schedule her scan.

## 2022-05-05 ENCOUNTER — Encounter (HOSPITAL_COMMUNITY): Payer: Self-pay

## 2022-05-05 ENCOUNTER — Ambulatory Visit (HOSPITAL_COMMUNITY)
Admission: RE | Admit: 2022-05-05 | Discharge: 2022-05-05 | Disposition: A | Discharge status: Home / Self Care | Payer: PPO | Source: Ambulatory Visit | Attending: Physician Assistant | Admitting: Physician Assistant

## 2022-05-05 DIAGNOSIS — J90 Pleural effusion, not elsewhere classified: Secondary | ICD-10-CM | POA: Diagnosis not present

## 2022-05-05 DIAGNOSIS — C3490 Malignant neoplasm of unspecified part of unspecified bronchus or lung: Secondary | ICD-10-CM | POA: Insufficient documentation

## 2022-05-05 DIAGNOSIS — J432 Centrilobular emphysema: Secondary | ICD-10-CM | POA: Diagnosis not present

## 2022-05-05 DIAGNOSIS — C3432 Malignant neoplasm of lower lobe, left bronchus or lung: Secondary | ICD-10-CM | POA: Diagnosis not present

## 2022-05-05 MED ORDER — SODIUM CHLORIDE (PF) 0.9 % IJ SOLN
INTRAMUSCULAR | Status: AC
  Filled 2022-05-05: qty 50

## 2022-05-05 MED ORDER — IOHEXOL 300 MG/ML  SOLN
75.0000 mL | Freq: Once | INTRAMUSCULAR | Status: AC | PRN
  Administered 2022-05-05: 75 mL via INTRAVENOUS

## 2022-05-08 ENCOUNTER — Other Ambulatory Visit: Payer: Self-pay

## 2022-05-08 ENCOUNTER — Inpatient Hospital Stay: Payer: PPO

## 2022-05-08 ENCOUNTER — Inpatient Hospital Stay (HOSPITAL_BASED_OUTPATIENT_CLINIC_OR_DEPARTMENT_OTHER): Payer: PPO | Admitting: Internal Medicine

## 2022-05-08 ENCOUNTER — Inpatient Hospital Stay: Payer: PPO | Attending: Internal Medicine

## 2022-05-08 ENCOUNTER — Other Ambulatory Visit: Payer: Self-pay | Admitting: Medical Oncology

## 2022-05-08 VITALS — BP 111/59 | HR 82 | Temp 98.8°F | Resp 15 | Wt 151.4 lb

## 2022-05-08 VITALS — BP 136/51 | HR 70 | Temp 97.8°F | Resp 18

## 2022-05-08 DIAGNOSIS — Z79899 Other long term (current) drug therapy: Secondary | ICD-10-CM | POA: Insufficient documentation

## 2022-05-08 DIAGNOSIS — C3490 Malignant neoplasm of unspecified part of unspecified bronchus or lung: Secondary | ICD-10-CM

## 2022-05-08 DIAGNOSIS — D509 Iron deficiency anemia, unspecified: Secondary | ICD-10-CM | POA: Insufficient documentation

## 2022-05-08 DIAGNOSIS — C3432 Malignant neoplasm of lower lobe, left bronchus or lung: Secondary | ICD-10-CM | POA: Insufficient documentation

## 2022-05-08 DIAGNOSIS — D649 Anemia, unspecified: Secondary | ICD-10-CM

## 2022-05-08 DIAGNOSIS — Z5112 Encounter for antineoplastic immunotherapy: Secondary | ICD-10-CM | POA: Insufficient documentation

## 2022-05-08 LAB — CBC WITH DIFFERENTIAL (CANCER CENTER ONLY)
Abs Immature Granulocytes: 0.05 10*3/uL (ref 0.00–0.07)
Basophils Absolute: 0 10*3/uL (ref 0.0–0.1)
Basophils Relative: 0 %
Eosinophils Absolute: 0.2 10*3/uL (ref 0.0–0.5)
Eosinophils Relative: 2 %
HCT: 25.3 % — ABNORMAL LOW (ref 36.0–46.0)
Hemoglobin: 7.6 g/dL — ABNORMAL LOW (ref 12.0–15.0)
Immature Granulocytes: 1 %
Lymphocytes Relative: 3 %
Lymphs Abs: 0.2 10*3/uL — ABNORMAL LOW (ref 0.7–4.0)
MCH: 25.3 pg — ABNORMAL LOW (ref 26.0–34.0)
MCHC: 30 g/dL (ref 30.0–36.0)
MCV: 84.3 fL (ref 80.0–100.0)
Monocytes Absolute: 0.7 10*3/uL (ref 0.1–1.0)
Monocytes Relative: 9 %
Neutro Abs: 6.6 10*3/uL (ref 1.7–7.7)
Neutrophils Relative %: 85 %
Platelet Count: 306 10*3/uL (ref 150–400)
RBC: 3 MIL/uL — ABNORMAL LOW (ref 3.87–5.11)
RDW: 16.2 % — ABNORMAL HIGH (ref 11.5–15.5)
WBC Count: 7.8 10*3/uL (ref 4.0–10.5)
nRBC: 0 % (ref 0.0–0.2)

## 2022-05-08 LAB — CMP (CANCER CENTER ONLY)
ALT: 10 U/L (ref 0–44)
AST: 16 U/L (ref 15–41)
Albumin: 3.5 g/dL (ref 3.5–5.0)
Alkaline Phosphatase: 67 U/L (ref 38–126)
Anion gap: 9 (ref 5–15)
BUN: 41 mg/dL — ABNORMAL HIGH (ref 8–23)
CO2: 24 mmol/L (ref 22–32)
Calcium: 9.2 mg/dL (ref 8.9–10.3)
Chloride: 104 mmol/L (ref 98–111)
Creatinine: 1.62 mg/dL — ABNORMAL HIGH (ref 0.44–1.00)
GFR, Estimated: 31 mL/min — ABNORMAL LOW (ref >60)
Glucose, Bld: 157 mg/dL — ABNORMAL HIGH (ref 70–99)
Potassium: 4.3 mmol/L (ref 3.5–5.1)
Sodium: 137 mmol/L (ref 135–145)
Total Bilirubin: 0.2 mg/dL — ABNORMAL LOW (ref 0.3–1.2)
Total Protein: 7.8 g/dL (ref 6.5–8.1)

## 2022-05-08 LAB — TSH: TSH: 0.562 u[IU]/mL (ref 0.350–4.500)

## 2022-05-08 LAB — PREPARE RBC (CROSSMATCH)

## 2022-05-08 LAB — SAMPLE TO BLOOD BANK

## 2022-05-08 MED ORDER — SODIUM CHLORIDE 0.9 % IV SOLN
1500.0000 mg | Freq: Once | INTRAVENOUS | Status: AC
  Administered 2022-05-08: 1500 mg via INTRAVENOUS
  Filled 2022-05-08: qty 30

## 2022-05-08 MED ORDER — ACETAMINOPHEN 325 MG PO TABS
650.0000 mg | ORAL_TABLET | Freq: Once | ORAL | Status: AC
  Administered 2022-05-08: 650 mg via ORAL
  Filled 2022-05-08: qty 2

## 2022-05-08 MED ORDER — SODIUM CHLORIDE 0.9 % IV SOLN
300.0000 mg | Freq: Once | INTRAVENOUS | Status: AC
  Administered 2022-05-08: 300 mg via INTRAVENOUS
  Filled 2022-05-08: qty 300

## 2022-05-08 MED ORDER — SODIUM CHLORIDE 0.9% IV SOLUTION
250.0000 mL | Freq: Once | INTRAVENOUS | Status: AC
  Administered 2022-05-08: 250 mL via INTRAVENOUS

## 2022-05-08 MED ORDER — SODIUM CHLORIDE 0.9 % IV SOLN: Freq: Once | INTRAVENOUS | Status: AC

## 2022-05-08 MED ORDER — LORATADINE 10 MG PO TABS
10.0000 mg | ORAL_TABLET | Freq: Once | ORAL | Status: AC
  Administered 2022-05-08: 10 mg via ORAL
  Filled 2022-05-08: qty 1

## 2022-05-08 NOTE — Progress Notes (Signed)
Abigail [name] [phone]   [phone]  OFFICE PROGRESS NOTE  [name], [address] [place] [address]  DIAGNOSIS: Stage IIIA (T2a, N2, M0) non-small cell lung cancer, adenocarcinoma presented with left lower lobe lung mass in addition to left hilar and mediastinal lymphadenopathy diagnosed in November 2022 with no actionable mutation on the guardant blood test   PRIOR THERAPY: Weekly concurrent chemoradiation with carboplatin for an AUC of 2 and paclitaxel 45 mg per metered squared.  First dose on 08/05/2021.  Status post 6 cycles.  Last dose was given on September 03, 2021.   CURRENT THERAPY: Consolidation treatment with immunotherapy with Imfinzi 1500 Mg IV every 4 weeks.  First dose October 23, 2021.  Status post 6 cycles.  INTERVAL HISTORY: Abigail [name] 86 y.o. female returns to the clinic today for follow-up visit.  The patient continues to complain of increasing fatigue and weakness.  Her hemoglobin is low today.  She has been on liquid iron but was unable to get it recently from the drugstore.  She is currently taking over the counter iron capsule twice daily.  She continues to have shortness of breath at baseline and on home oxygen.  She has no chest pain, cough or hemoptysis.  She has no nausea, vomiting, diarrhea or constipation.  She has no headache or visual changes.  She has no weight loss or night sweats.  The patient had repeat CT scan of the chest performed recently and she is here for evaluation and discussion of her scan results.  MEDICAL HISTORY: Past Medical History:  Diagnosis Date   Anemia    Arthritis    knees, back   Carotid artery occlusion    Chronic kidney disease    stage 3 ckd no nephrologist   Complete uterine prolapse with prolapse of anterior vaginal wall    Complication of anesthesia    hard to wake up per pt   Constipation    Coronary artery disease    Diverticulitis yrs ago coialitis    Dyspnea    History of blood transfusion    History of radiation therapy    right lung 08/05/2021-09/18/2021  Dr Gery Pray   Hypertension    Hypothyroid    Lung cancer ([place])    Numbness    in hands at times   Pneumonia    Pre-diabetes    Scoliosis    STEMI (ST elevation myocardial infarction) ([place]) 10/26/2019   DES RCA   Wears dentures    full dentures   Wears glasses    for reading    ALLERGIES:  is allergic to codeine and norvasc [amlodipine].  MEDICATIONS:  Current Outpatient Medications  Medication Sig Dispense Refill   acetaminophen (TYLENOL) 325 MG tablet Take 2 tablets (650 mg total) by mouth every 6 (six) hours as needed for mild pain (or Fever >/= 101).     albuterol (VENTOLIN HFA) 108 (90 Base) MCG/ACT inhaler TAKE 2 PUFFS BY MOUTH EVERY 6 HOURS AS NEEDED FOR WHEEZE OR SHORTNESS OF BREATH 8.5 each 1   aspirin EC 81 MG tablet Take 81 mg by mouth at bedtime.     atorvastatin (LIPITOR) 80 MG tablet Take 1 tablet (80 mg total) by mouth daily at 6 PM. 90 tablet 3   budesonide-formoterol (SYMBICORT) 80-4.5 MCG/ACT inhaler Inhale 2 puffs into the lungs 2 (two) times daily. 3 each 1   Cholecalciferol (VITAMIN D3 PO) Take 2 capsules by  mouth daily. (Patient not taking: Reported on 02/13/2022)     Cranberry 1000 MG CAPS Take 1,000 mg by mouth daily at 12 noon.     cyclobenzaprine (FLEXERIL) 10 MG tablet Take 1 tablet (10 mg total) by mouth 3 (three) times daily as needed for muscle spasms. 30 tablet 1   docusate sodium (COLACE) 100 MG capsule Take 100-200 mg by mouth See admin instructions. Take 100 mg in the morning and 200 mg at bedtime     esomeprazole (NEXIUM) 20 MG capsule Take 1 capsule (20 mg total) by mouth 2 (two) times daily before a meal. 60 capsule 3   estradiol (ESTRACE) 0.1 MG/GM vaginal cream Place 0.5 g vaginally once a week. Place 0.5g twice a week at opening of vagina     ezetimibe (ZETIA) 10 MG tablet Take 1 tablet (10 mg total) by mouth daily. 90 tablet 3    ferrous sulfate 300 (60 Fe) MG/5ML syrup Take 5 mLs (300 mg total) by mouth daily with breakfast. 300 mL 5   hydrOXYzine (ATARAX) 10 MG tablet Take 1 tablet (10 mg total) by mouth 3 (three) times daily as needed. 30 tablet 0   levothyroxine (SYNTHROID) 200 MCG tablet PLEASE ALTERNATE 175 MCG AND 200 MG DOSE TABLET EVERY OTHER DAY 30 tablet 1   lisinopril-hydrochlorothiazide (ZESTORETIC) 20-25 MG tablet Take 0.5 tablets by mouth 3 (three) times a week. Take on Monday Wednesday and Friday only 90 tablet 1   nitroGLYCERIN (NITROSTAT) 0.4 MG SL tablet Place 1 tablet (0.4 mg total) under the tongue every 5 (five) minutes x 3 doses as needed for chest pain. 25 tablet 3   OXYGEN Inhale into the lungs.     prochlorperazine (COMPAZINE) 10 MG tablet Take 1 tablet (10 mg total) by mouth every 6 (six) hours as needed for nausea or vomiting. 30 tablet 2   Trospium Chloride 60 MG CP24 Take 1 capsule (60 mg total) by mouth daily. 30 capsule 11   No current facility-administered medications for this visit.    SURGICAL HISTORY:  Past Surgical History:  Procedure Laterality Date   ANTERIOR AND POSTERIOR REPAIR WITH SACROSPINOUS FIXATION N/A 12/20/2020   Procedure: SACROSPINOUS LIGAMENT FIXATION;  Surgeon: [name], MD;  Location: [facility];  Service: Gynecology;  Laterality: N/A;  Total time requested for all procedures is 2 hours   [place] age 30   lower   bartholin cyst removal  age 19's   BLADDER SUSPENSION N/A 12/20/2020   Procedure: TRANSVAGINAL TAPE (TVT) PROCEDURE;  Surgeon: [name], MD;  Location: [facility];  Service: Gynecology;  Laterality: N/A;   BRONCHIAL BRUSHINGS  07/15/2021   Procedure: BRONCHIAL BRUSHINGS;  Surgeon: Collene Gobble, MD;  Location: [facility] ENDOSCOPY;  Service: Pulmonary;;   BRONCHIAL NEEDLE ASPIRATION BIOPSY  07/15/2021   Procedure: BRONCHIAL NEEDLE ASPIRATION BIOPSIES;  Surgeon: Collene Gobble, MD;  Location:  [facility] ENDOSCOPY;  Service: Pulmonary;;   CORONARY/GRAFT ACUTE MI REVASCULARIZATION N/A 10/26/2019   Procedure: Coronary/Graft Acute MI Revascularization;  Surgeon: Sherren Mocha, MD;  Location: [place];  Service: Cardiovascular;  Laterality: N/A;   CYSTOCELE REPAIR N/A 12/20/2020   Procedure: ANTERIOR AND POSTERIOR REPAIR WITH PERINEORRHAPHY;  Surgeon: [name], MD;  Location: [facility];  Service: Gynecology;  Laterality: N/A;   CYSTOSCOPY N/A 12/20/2020   Procedure: CYSTOSCOPY;  Surgeon: [name], MD;  Location: [facility];  Service: Gynecology;  Laterality: N/A;  ELBOW SURGERY  1990's   left   ENDOBRONCHIAL ULTRASOUND N/A 07/15/2021   Procedure: ENDOBRONCHIAL ULTRASOUND;  Surgeon: Collene Gobble, MD;  Location: [facility] ENDOSCOPY;  Service: Pulmonary;  Laterality: N/A;   HEMOSTASIS CONTROL  07/15/2021   Procedure: HEMOSTASIS CONTROL;  Surgeon: Collene Gobble, MD;  Location: [facility] ENDOSCOPY;  Service: Pulmonary;;   LEFT HEART CATH AND CORONARY ANGIOGRAPHY N/A 10/26/2019   Procedure: LEFT HEART CATH AND CORONARY ANGIOGRAPHY;  Surgeon: Sherren Mocha, MD;  Location: [place] [facility];  Service: Cardiovascular;  Laterality: N/A;   TONSILLECTOMY      REVIEW OF SYSTEMS:  Constitutional: positive for fatigue Eyes: negative Ears, nose, mouth, throat, and face: negative Respiratory: negative Cardiovascular: negative Gastrointestinal: negative Genitourinary:negative Integument/breast: negative Hematologic/lymphatic: negative Musculoskeletal:positive for muscle weakness Neurological: negative Behavioral/Psych: negative Endocrine: negative Allergic/Immunologic: negative   PHYSICAL EXAMINATION: General appearance: alert, cooperative, fatigued, and no distress Head: Normocephalic, without obvious abnormality, atraumatic Neck: no adenopathy, no JVD, supple, symmetrical, trachea midline, and thyroid not enlarged, symmetric, no  tenderness/mass/nodules Lymph nodes: Cervical, supraclavicular, and axillary nodes normal. Resp: clear to auscultation bilaterally Back: symmetric, no curvature. ROM normal. No CVA tenderness. Cardio: regular rate and rhythm, S1, S2 normal, no murmur, click, rub or gallop GI: soft, non-tender; bowel sounds normal; no masses,  no organomegaly Extremities: extremities normal, atraumatic, no cyanosis or edema Neurologic: Alert and oriented X 3, normal strength and tone. Normal symmetric reflexes. Normal coordination and gait  ECOG PERFORMANCE STATUS: 1 - Symptomatic but completely ambulatory  Blood pressure (!) 111/59, pulse 82, temperature 98.8 F (37.1 C), temperature source Oral, resp. rate 15, weight 151 lb 6.4 oz (68.7 kg), SpO2 98 %.  LABORATORY DATA: Lab Results  Component Value Date   WBC 7.8 05/08/2022   HGB 7.6 (L) 05/08/2022   HCT 25.3 (L) 05/08/2022   MCV 84.3 05/08/2022   PLT 306 05/08/2022      Chemistry      Component Value Date/Time   NA 136 04/10/2022 0941   NA 137 08/28/2021 0923   K 4.1 04/10/2022 0941   CL 102 04/10/2022 0941   CO2 27 04/10/2022 0941   BUN 28 (H) 04/10/2022 0941   BUN 20 08/28/2021 0923   CREATININE 1.31 (H) 04/10/2022 0941   CREATININE 1.08 04/18/2014 1516   GLU 104 12/23/2017 1111      Component Value Date/Time   CALCIUM 9.3 04/10/2022 0941   ALKPHOS 77 04/10/2022 0941   AST 11 (L) 04/10/2022 0941   ALT 8 04/10/2022 0941   BILITOT 0.3 04/10/2022 0941       RADIOGRAPHIC STUDIES: CT Chest W Contrast  Result Date: 05/05/2022 CLINICAL DATA:  Non-small cell left lower lobe lung cancer status post chemo radiation therapy completed January 2023 with ongoing immunotherapy. Restaging. Chronic cough and worsening dyspnea. * Tracking Code: BO * EXAM: CT CHEST WITH CONTRAST TECHNIQUE: Multidetector CT imaging of the chest was performed during intravenous contrast administration. RADIATION DOSE REDUCTION: This exam was performed according to  the departmental dose-optimization program which includes automated exposure control, adjustment of the mA and/or kV according to patient size and/or use of iterative reconstruction technique. CONTRAST:  50mL OMNIPAQUE IOHEXOL 300 MG/ML  SOLN COMPARISON:  02/10/2022 chest CT. FINDINGS: Cardiovascular: Normal heart size. No significant pericardial effusion/thickening. Three-vessel coronary atherosclerosis. Atherosclerotic nonaneurysmal thoracic aorta. Normal caliber pulmonary arteries. No central pulmonary emboli. Mediastinum/Nodes: No discrete thyroid nodules. Unremarkable esophagus. No pathologically enlarged axillary, mediastinal or hilar lymph nodes. Lungs/Pleura: No pneumothorax. No right pleural effusion.  Trace dependent left pleural effusion is similar. Mild to moderate centrilobular emphysema. Solid left lower lobe 1.6 x 1.2 cm pulmonary nodule (series 7/image 47), previously 1.8 x 1.3 cm using similar measurement technique, slightly decreased. Increased sharply marginated extensive patchy left perihilar lung consolidation with some associated volume loss and distortion, compatible with evolving postradiation change. No new significant pulmonary nodules. Upper abdomen: Moderate hiatal hernia. Partially visualized 2.4 cm staghorn calculus in the upper left renal collecting system. Multiple hypodense upper left renal cortical lesions, largest 1.8 cm posteriorly (series 2/image 150), not appreciably changed in size since 07/09/2021 PET-CT. Simple 2.3 cm lateral upper left renal cyst, for which no follow-up imaging is recommended. Moderate left colonic diverticulosis. Musculoskeletal: No aggressive appearing focal osseous lesions. Marked thoracic spondylosis. IMPRESSION: 1. Interval slight reduction in size of treated solid left lower lobe pulmonary nodule with continued expected evolution of surrounding post radiation changes. 2. No evidence of metastatic disease in the chest. 3. Three-vessel coronary  atherosclerosis. 4. Moderate hiatal hernia. 5. Partially visualized 2.4 cm staghorn calculus in the upper left renal collecting system. 6. Multiple indeterminate hypodense upper left renal cortical lesions, largest 1.8 cm posteriorly, not appreciably changed in size since 07/09/2021 PET-CT. Suggest no further evaluation given patient comorbidities. 7. Aortic Atherosclerosis (ICD10-I70.0) and Emphysema (ICD10-J43.9). Electronically Signed   By: Ilona Sorrel M.D.   On: 05/05/2022 17:22    ASSESSMENT AND PLAN: This is a very pleasant 86 years old white female with stage IIIA (T2a, N2, M0) non-small cell lung cancer, adenocarcinoma presented with left lower lobe lung mass in addition to left hilar and mediastinal lymphadenopathy diagnosed in November 2022 with no actionable mutations The patient is currently undergoing a course of concurrent chemotherapy with radiation.  Her chemotherapy is in the form of carboplatin for AUC of 2 and paclitaxel 45 Mg/M2 status post 6 cycles.  She has been tolerating her treatment with concurrent chemoradiation fairly well except for the radiation-induced odynophagia and dysphagia. Her scan showed significant improvement in her disease with 65% reduction in the volume of the left lower lobe nodule.  She continues to have persistent left hilar and infrahilar adenopathy. The patient is currently undergoing treatment with consolidation immunotherapy with Imfinzi 1500 Mg IV every 4 weeks.  Status post 6 cycles. The patient has been tolerating this treatment fairly well with no concerning adverse effects. She had repeat CT scan of the chest performed recently.  I personally and independently reviewed the scans and discussed the result with the patient today. Her scan showed further improvement of her disease with decrease in the size of the solid left lower lobe pulmonary nodule. I recommended for her to continue her current consolidation treatment with Imfinzi and she will proceed  with cycle #7 today. For the anemia, we will arrange for the patient to receive 2 units of PRBCs transfusion either today or the next few days.  She was seen by Dr. Lorenso Courier from gastroenterology and she arranged for her to receive iron infusion.  I also recommended for the patient to continue on the oral iron tablets. I will see her back for follow-up visit in 4 weeks for evaluation before starting cycle #8. The patient was advised to call immediately if she has any other concerning symptoms in the interval. The patient voices understanding of current disease status and treatment options and is in agreement with the current care plan.  All questions were answered. The patient knows to call the clinic with any problems, questions or concerns.  We can certainly see the patient much sooner if necessary.  Disclaimer: This note was dictated with voice recognition software. Similar sounding words can inadvertently be transcribed and may not be corrected upon review.

## 2022-05-08 NOTE — Progress Notes (Signed)
Per Dr. Julien Nordmann, it is okay to treat pt today with imfinzi and creatinine of 1.6.

## 2022-05-08 NOTE — Patient Instructions (Addendum)
[facility]  Discharge Instructions: Thank you for choosing [facility] to provide your oncology and hematology care.   If you have a lab appointment with the [facility], please go directly to the [facility] and check in at the registration area.   Wear comfortable clothing and clothing appropriate for easy access to any Portacath or PICC line.   We strive to give you quality time with your provider. You may need to reschedule your appointment if you arrive late (15 or more minutes).  Arriving late affects you and other patients whose appointments are after yours.  Also, if you miss three or more appointments without notifying the office, you may be dismissed from the clinic at the provider's discretion.      For prescription refill requests, have your pharmacy contact our office and allow 72 hours for refills to be completed.    Today you received the following chemotherapy and/or immunotherapy agents: Imfinzi      To help prevent nausea and vomiting after your treatment, we encourage you to take your nausea medication as directed.  BELOW ARE SYMPTOMS THAT SHOULD BE REPORTED IMMEDIATELY: *FEVER GREATER THAN 100.4 F (38 C) OR HIGHER *CHILLS OR SWEATING *NAUSEA AND VOMITING THAT IS NOT CONTROLLED WITH YOUR NAUSEA MEDICATION *UNUSUAL SHORTNESS OF BREATH *UNUSUAL BRUISING OR BLEEDING *URINARY PROBLEMS (pain or burning when urinating, or frequent urination) *BOWEL PROBLEMS (unusual diarrhea, constipation, pain near the anus) TENDERNESS IN MOUTH AND THROAT WITH OR WITHOUT PRESENCE OF ULCERS (sore throat, sores in mouth, or a toothache) UNUSUAL RASH, SWELLING OR PAIN  UNUSUAL VAGINAL DISCHARGE OR ITCHING   Items with * indicate a potential emergency and should be followed up as soon as possible or go to the Emergency Department if any problems should occur.  Please show the CHEMOTHERAPY ALERT CARD or IMMUNOTHERAPY ALERT CARD at check-in to  the Emergency Department and triage nurse.  Should you have questions after your visit or need to cancel or reschedule your appointment, please contact [facility]  Dept: [phone]  and follow the prompts.  Office hours are 8:00 a.m. to 4:30 p.m. Monday - Friday. Please note that voicemails left after 4:00 p.m. may not be returned until the following business day.  We are closed weekends and major holidays. You have access to a nurse at all times for urgent questions. Please call the main number to the clinic Dept: [phone] and follow the prompts.   For any non-urgent questions, you may also contact your provider using MyChart. We now offer e-Visits for anyone 74 and older to request care online for non-urgent symptoms. For details visit [email or web address].   Also download the MyChart app! Go to the app store, search "MyChart", open the app, select [facility], and log in with your MyChart username and password.  Masks are optional in the cancer centers. If you would like for your care team to wear a mask while they are taking care of you, please let them know. You may have one support person who is at least 84 years old accompany you for your appointments. Iron Sucrose Injection What is this medication? IRON SUCROSE (EYE ern SOO krose) treats low levels of iron (iron deficiency anemia) in people with kidney disease. Iron is a mineral that plays an important role in making red blood cells, which carry oxygen from your lungs to the rest of your body. This medicine may be used for other purposes; ask your  health care provider or pharmacist if you have questions. COMMON BRAND NAME(S): Venofer What should I tell my care team before I take this medication? They need to know if you have any of these conditions: Anemia not caused by low iron levels Heart disease High levels of iron in the blood Kidney disease Liver disease An unusual or allergic reaction to  iron, other medications, foods, dyes, or preservatives Pregnant or trying to get pregnant Breast-feeding How should I use this medication? This medication is for infusion into a vein. It is given in a hospital or clinic setting. Talk to your care team about the use of this medication in children. While this medication may be prescribed for children as young as 2 years for selected conditions, precautions do apply. Overdosage: If you think you have taken too much of this medicine contact a poison control center or emergency room at once. NOTE: This medicine is only for you. Do not share this medicine with others. What if I miss a dose? It is important not to miss your dose. Call your care team if you are unable to keep an appointment. What may interact with this medication? Do not take this medication with any of the following: Deferoxamine Dimercaprol Other iron products This medication may also interact with the following: Chloramphenicol Deferasirox This list may not describe all possible interactions. Give your health care provider a list of all the medicines, herbs, non-prescription drugs, or dietary supplements you use. Also tell them if you smoke, drink alcohol, or use illegal drugs. Some items may interact with your medicine. What should I watch for while using this medication? Visit your care team regularly. Tell your care team if your symptoms do not start to get better or if they get worse. You may need blood work done while you are taking this medication. You may need to follow a special diet. Talk to your care team. Foods that contain iron include: whole grains/cereals, dried fruits, beans, or peas, leafy green vegetables, and organ meats (liver, kidney). What side effects may I notice from receiving this medication? Side effects that you should report to your care team as soon as possible: Allergic reactions--skin rash, itching, hives, swelling of the face, lips, tongue, or  throat Low blood pressure--dizziness, feeling faint or lightheaded, blurry vision Shortness of breath Side effects that usually do not require medical attention (report to your care team if they continue or are bothersome): Flushing Headache Joint pain Muscle pain Nausea Pain, redness, or irritation at injection site This list may not describe all possible side effects. Call your doctor for medical advice about side effects. You may report side effects to FDA at 1-800-FDA-1088. Where should I keep my medication? This medication is given in a hospital or clinic and will not be stored at home. NOTE: This sheet is a summary. It may not cover all possible information. If you have questions about this medicine, talk to your doctor, pharmacist, or health care provider.  2023 Elsevier/Gold Standard (2007-10-02 00:00:00)  Blood Transfusion, Adult A blood transfusion is a procedure in which you receive blood through an IV tube. You may need this procedure because of: A bleeding disorder. An illness. An injury. A surgery. The blood may come from someone else (a donor). You may also be able to donate blood for yourself before a surgery. The blood given in a transfusion may be made up of different types of cells. You may get: Red blood cells. These carry oxygen to the cells  in the body. Platelets. These help your blood to clot. Plasma. This is the liquid part of your blood. It carries proteins and other substances through the body. White blood cells. These help you fight infections. If you have a clotting disorder, you may also get other types of blood products. Depending on the type of blood product, this procedure may take 1-4 hours to complete. Tell your doctor about: Any bleeding problems you have. Any reactions you have had during a blood transfusion in the past. Any allergies you have. All medicines you are taking, including vitamins, herbs, eye drops, creams, and over-the-counter  medicines. Any surgeries you have had. Any medical conditions you have. Whether you are pregnant or may be pregnant. What are the risks? Talk with your health care provider about risks. The most common problems include: A mild allergic reaction. This includes red, swollen areas of skin (hives) and itching. Fever or chills. This may be the body's response to new blood cells received. This may happen during or up to 4 hours after the transfusion. More serious problems may include: A serious allergic reaction. This includes breathing trouble or swelling around the face and lips. Too much fluid in the lungs. This may cause breathing problems. Lung injury. This causes breathing trouble and low oxygen in the blood. This can happen within hours of the transfusion or days later. Too much iron. This can happen after getting many blood transfusions over a period of time. An infection or virus passed through the blood. This is rare. Donated blood is carefully tested before it is given. Your body's defense system (immune system) trying to attack the new blood cells. This is rare. Symptoms may include fever, chills, nausea, low blood pressure, and low back or chest pain. Donated cells attacking healthy tissues. This is rare. What happens before the procedure? You will have a blood test to find out your blood type. The test also finds out what type of blood your body will accept and matches it to the donor type. If you are going to have a planned surgery, you may be able to donate your own blood. This may be done in case you need a transfusion. You will have your temperature, blood pressure, and pulse checked. You may receive medicine to help prevent an allergic reaction. This may be done if you have had a reaction to a transfusion before. This medicine may be given to you by mouth or through an IV tube. What happens during the procedure?  An IV tube will be put into one of your veins. The bag of blood  will be attached to your IV tube. Then, the blood will enter through your vein. Your temperature, blood pressure, and pulse will be checked often. This is done to find early signs of a transfusion reaction. Tell your nurse right away if you have any of these symptoms: Shortness of breath or trouble breathing. Chest or back pain. Fever or chills. Red, swollen areas of skin or itching. If you have any signs or symptoms of a reaction, your transfusion will be stopped. You may also be given medicine. When the transfusion is finished, your IV tube will be taken out. Pressure may be put on the IV site for a few minutes. A bandage (dressing) will be put on the IV site. The procedure may vary among doctors and hospitals. What happens after the procedure? You will be monitored until you leave the hospital or clinic. This includes checking your temperature, blood pressure, pulse,  breathing rate, and blood oxygen level. Your blood may be tested to see how you have responded to the transfusion. You may be warmed with fluids or blankets. This is done to keep the temperature of your body normal. If you have your procedure in an outpatient setting, you will be told whom to contact to report any reactions. Where to find more information Visit the American Red Cross: redcross.org Summary A blood transfusion is a procedure in which you receive blood through an IV tube. The blood you are given may be made up of different blood cells. You may receive red blood cells, platelets, plasma, or white blood cells. Your temperature, blood pressure, and pulse will be checked often. After the procedure, your blood may be tested to see how you have responded. This information is not intended to replace advice given to you by your health care provider. Make sure you discuss any questions you have with your health care provider. Document Revised: 11/08/2021 Document Reviewed: 11/08/2021 Elsevier Patient Education  [place].

## 2022-05-08 NOTE — Addendum Note (Signed)
Addended by: Ardeen Garland on: 05/08/2022 11:39 AM   Modules accepted: Orders

## 2022-05-08 NOTE — Addendum Note (Signed)
Addended by: Ardeen Garland on: 05/08/2022 11:45 AM   Modules accepted: Orders

## 2022-05-09 ENCOUNTER — Inpatient Hospital Stay: Payer: PPO

## 2022-05-09 ENCOUNTER — Other Ambulatory Visit: Payer: Self-pay

## 2022-05-09 DIAGNOSIS — Z5112 Encounter for antineoplastic immunotherapy: Secondary | ICD-10-CM | POA: Diagnosis not present

## 2022-05-09 DIAGNOSIS — D649 Anemia, unspecified: Secondary | ICD-10-CM

## 2022-05-09 DIAGNOSIS — C3490 Malignant neoplasm of unspecified part of unspecified bronchus or lung: Secondary | ICD-10-CM

## 2022-05-09 MED ORDER — SODIUM CHLORIDE 0.9% IV SOLUTION
250.0000 mL | Freq: Once | INTRAVENOUS | Status: AC
  Administered 2022-05-09: 250 mL via INTRAVENOUS

## 2022-05-09 MED ORDER — DIPHENHYDRAMINE HCL 25 MG PO CAPS
25.0000 mg | ORAL_CAPSULE | Freq: Once | ORAL | Status: AC
  Administered 2022-05-09: 25 mg via ORAL
  Filled 2022-05-09: qty 1

## 2022-05-09 MED ORDER — ACETAMINOPHEN 325 MG PO TABS
650.0000 mg | ORAL_TABLET | Freq: Once | ORAL | Status: AC
  Administered 2022-05-09: 650 mg via ORAL
  Filled 2022-05-09: qty 2

## 2022-05-09 NOTE — Patient Instructions (Signed)

## 2022-05-10 LAB — BPAM RBC
Blood Product Expiration Date: 202309262359
Blood Product Expiration Date: 202309272359
ISSUE DATE / TIME: 202309141535
ISSUE DATE / TIME: 202309151314
Unit Type and Rh: 7300
Unit Type and Rh: 7300

## 2022-05-10 LAB — TYPE AND SCREEN
ABO/RH(D): B POS
Antibody Screen: NEGATIVE
Unit division: 0
Unit division: 0

## 2022-05-15 ENCOUNTER — Inpatient Hospital Stay: Payer: PPO

## 2022-05-15 ENCOUNTER — Other Ambulatory Visit: Payer: Self-pay

## 2022-05-15 VITALS — BP 145/66 | HR 67 | Temp 98.0°F | Resp 20

## 2022-05-15 DIAGNOSIS — Z5112 Encounter for antineoplastic immunotherapy: Secondary | ICD-10-CM | POA: Diagnosis not present

## 2022-05-15 DIAGNOSIS — D509 Iron deficiency anemia, unspecified: Secondary | ICD-10-CM

## 2022-05-15 MED ORDER — LORATADINE 10 MG PO TABS
10.0000 mg | ORAL_TABLET | Freq: Every day | ORAL | Status: DC
  Administered 2022-05-15: 10 mg via ORAL
  Filled 2022-05-15: qty 1

## 2022-05-15 MED ORDER — SODIUM CHLORIDE 0.9 % IV SOLN
300.0000 mg | Freq: Once | INTRAVENOUS | Status: AC
  Administered 2022-05-15: 300 mg via INTRAVENOUS
  Filled 2022-05-15: qty 300

## 2022-05-15 MED ORDER — ACETAMINOPHEN 325 MG PO TABS
650.0000 mg | ORAL_TABLET | Freq: Once | ORAL | Status: AC
  Administered 2022-05-15: 650 mg via ORAL
  Filled 2022-05-15: qty 2

## 2022-05-15 MED ORDER — SODIUM CHLORIDE 0.9 % IV SOLN: Freq: Once | INTRAVENOUS | Status: AC

## 2022-05-15 NOTE — Patient Instructions (Signed)

## 2022-05-20 DIAGNOSIS — C3490 Malignant neoplasm of unspecified part of unspecified bronchus or lung: Secondary | ICD-10-CM | POA: Diagnosis not present

## 2022-05-22 ENCOUNTER — Other Ambulatory Visit: Payer: Self-pay

## 2022-05-22 ENCOUNTER — Inpatient Hospital Stay: Payer: PPO

## 2022-05-22 VITALS — BP 157/57 | HR 64 | Temp 98.1°F | Resp 18

## 2022-05-22 DIAGNOSIS — Z5112 Encounter for antineoplastic immunotherapy: Secondary | ICD-10-CM | POA: Diagnosis not present

## 2022-05-22 DIAGNOSIS — D509 Iron deficiency anemia, unspecified: Secondary | ICD-10-CM

## 2022-05-22 MED ORDER — LORATADINE 10 MG PO TABS
10.0000 mg | ORAL_TABLET | Freq: Once | ORAL | Status: AC
  Administered 2022-05-22: 10 mg via ORAL
  Filled 2022-05-22: qty 1

## 2022-05-22 MED ORDER — ACETAMINOPHEN 325 MG PO TABS
650.0000 mg | ORAL_TABLET | Freq: Once | ORAL | Status: AC
  Administered 2022-05-22: 650 mg via ORAL
  Filled 2022-05-22: qty 2

## 2022-05-22 MED ORDER — SODIUM CHLORIDE 0.9 % IV SOLN: Freq: Once | INTRAVENOUS | Status: AC

## 2022-05-22 MED ORDER — SODIUM CHLORIDE 0.9 % IV SOLN
300.0000 mg | Freq: Once | INTRAVENOUS | Status: AC
  Administered 2022-05-22: 300 mg via INTRAVENOUS
  Filled 2022-05-22: qty 300

## 2022-05-22 NOTE — Patient Instructions (Signed)

## 2022-05-22 NOTE — Progress Notes (Signed)
Patient transportation [employer] [phone]

## 2022-05-22 NOTE — Progress Notes (Signed)
Patient observed for 30 minutes post venofer infusion. Patient tolerated tx without incident, VSS, w/c assist to lobby by NT.

## 2022-06-05 ENCOUNTER — Inpatient Hospital Stay: Payer: PPO

## 2022-06-05 ENCOUNTER — Inpatient Hospital Stay (HOSPITAL_BASED_OUTPATIENT_CLINIC_OR_DEPARTMENT_OTHER): Payer: PPO | Admitting: Internal Medicine

## 2022-06-05 ENCOUNTER — Inpatient Hospital Stay: Payer: PPO | Attending: Internal Medicine

## 2022-06-05 ENCOUNTER — Encounter: Payer: Self-pay | Admitting: Internal Medicine

## 2022-06-05 VITALS — BP 143/66 | HR 81 | Temp 97.0°F | Resp 17 | Wt 150.1 lb

## 2022-06-05 VITALS — BP 131/54 | HR 81 | Resp 18

## 2022-06-05 DIAGNOSIS — C3432 Malignant neoplasm of lower lobe, left bronchus or lung: Secondary | ICD-10-CM | POA: Diagnosis not present

## 2022-06-05 DIAGNOSIS — C3490 Malignant neoplasm of unspecified part of unspecified bronchus or lung: Secondary | ICD-10-CM

## 2022-06-05 DIAGNOSIS — Z5112 Encounter for antineoplastic immunotherapy: Secondary | ICD-10-CM

## 2022-06-05 DIAGNOSIS — R1310 Dysphagia, unspecified: Secondary | ICD-10-CM | POA: Insufficient documentation

## 2022-06-05 DIAGNOSIS — D649 Anemia, unspecified: Secondary | ICD-10-CM | POA: Insufficient documentation

## 2022-06-05 DIAGNOSIS — Z79899 Other long term (current) drug therapy: Secondary | ICD-10-CM | POA: Diagnosis not present

## 2022-06-05 LAB — CMP (CANCER CENTER ONLY)
ALT: 7 U/L (ref 0–44)
AST: 9 U/L — ABNORMAL LOW (ref 15–41)
Albumin: 3.8 g/dL (ref 3.5–5.0)
Alkaline Phosphatase: 73 U/L (ref 38–126)
Anion gap: 5 (ref 5–15)
BUN: 43 mg/dL — ABNORMAL HIGH (ref 8–23)
CO2: 26 mmol/L (ref 22–32)
Calcium: 9.3 mg/dL (ref 8.9–10.3)
Chloride: 101 mmol/L (ref 98–111)
Creatinine: 1.47 mg/dL — ABNORMAL HIGH (ref 0.44–1.00)
GFR, Estimated: 35 mL/min — ABNORMAL LOW (ref >60)
Glucose, Bld: 134 mg/dL — ABNORMAL HIGH (ref 70–99)
Potassium: 4.9 mmol/L (ref 3.5–5.1)
Sodium: 132 mmol/L — ABNORMAL LOW (ref 135–145)
Total Bilirubin: 0.3 mg/dL (ref 0.3–1.2)
Total Protein: 7.9 g/dL (ref 6.5–8.1)

## 2022-06-05 LAB — CBC WITH DIFFERENTIAL (CANCER CENTER ONLY)
Abs Immature Granulocytes: 0.06 10*3/uL (ref 0.00–0.07)
Basophils Absolute: 0 10*3/uL (ref 0.0–0.1)
Basophils Relative: 0 %
Eosinophils Absolute: 0.2 10*3/uL (ref 0.0–0.5)
Eosinophils Relative: 2 %
HCT: 31.9 % — ABNORMAL LOW (ref 36.0–46.0)
Hemoglobin: 10 g/dL — ABNORMAL LOW (ref 12.0–15.0)
Immature Granulocytes: 1 %
Lymphocytes Relative: 4 %
Lymphs Abs: 0.3 10*3/uL — ABNORMAL LOW (ref 0.7–4.0)
MCH: 27.2 pg (ref 26.0–34.0)
MCHC: 31.3 g/dL (ref 30.0–36.0)
MCV: 86.7 fL (ref 80.0–100.0)
Monocytes Absolute: 0.9 10*3/uL (ref 0.1–1.0)
Monocytes Relative: 11 %
Neutro Abs: 7 10*3/uL (ref 1.7–7.7)
Neutrophils Relative %: 82 %
Platelet Count: 266 10*3/uL (ref 150–400)
RBC: 3.68 MIL/uL — ABNORMAL LOW (ref 3.87–5.11)
RDW: 19.4 % — ABNORMAL HIGH (ref 11.5–15.5)
WBC Count: 8.5 10*3/uL (ref 4.0–10.5)
nRBC: 0 % (ref 0.0–0.2)

## 2022-06-05 LAB — SAMPLE TO BLOOD BANK

## 2022-06-05 LAB — TSH: TSH: 1.551 u[IU]/mL (ref 0.350–4.500)

## 2022-06-05 MED ORDER — SODIUM CHLORIDE 0.9 % IV SOLN: Freq: Once | INTRAVENOUS | Status: AC

## 2022-06-05 MED ORDER — SODIUM CHLORIDE 0.9 % IV SOLN
1500.0000 mg | Freq: Once | INTRAVENOUS | Status: AC
  Administered 2022-06-05: 1500 mg via INTRAVENOUS
  Filled 2022-06-05: qty 30

## 2022-06-05 NOTE — Progress Notes (Signed)
[facility] Telephone:([phone]) [phone]   Fax:([phone]) [phone]  OFFICE PROGRESS NOTE  [name], [address] [address]  DIAGNOSIS: Stage IIIA (T2a, N2, M0) non-small cell lung cancer, adenocarcinoma presented with left lower lobe lung mass in addition to left hilar and mediastinal lymphadenopathy diagnosed in November 2022 with no actionable mutation on the guardant blood test   PRIOR THERAPY: Weekly concurrent chemoradiation with carboplatin for an AUC of 2 and paclitaxel 45 mg per metered squared.  First dose on 08/05/2021.  Status post 6 cycles.  Last dose was given on September 03, 2021.   CURRENT THERAPY: Consolidation treatment with immunotherapy with Imfinzi 1500 Mg IV every 4 weeks.  First dose October 23, 2021.  Status post 8 cycles.  INTERVAL HISTORY: Abigail [name] 86 y.o. female returns to the clinic today for follow-up visit.  The patient is feeling fine today with no concerning complaints except for the baseline fatigue and shortness of breath at baseline increased with exertion and currently on home oxygen.  She has no nausea, vomiting, diarrhea or constipation.  She has no headache or visual changes.  She denied having any recent weight loss or night sweats.  She has been tolerating her treatment with consolidation immunotherapy fairly well.  She is here for evaluation before starting cycle #8.  MEDICAL HISTORY: Past Medical History:  Diagnosis Date   Anemia    Arthritis    knees, back   Carotid artery occlusion    Chronic kidney disease    stage 3 ckd no nephrologist   Complete uterine prolapse with prolapse of anterior vaginal wall    Complication of anesthesia    hard to wake up per pt   Constipation    Coronary artery disease    Diverticulitis yrs ago coialitis   Dyspnea    History of blood transfusion    History of radiation therapy    right lung 08/05/2021-09/18/2021  Dr Gery Pray   Hypertension    Hypothyroid     Lung cancer ([place])    Numbness    in hands at times   Pneumonia    Pre-diabetes    Scoliosis    STEMI (ST elevation myocardial infarction) ([facility]) 10/26/2019   DES RCA   Wears dentures    full dentures   Wears glasses    for reading    ALLERGIES:  is allergic to codeine and norvasc [amlodipine].  MEDICATIONS:  Current Outpatient Medications  Medication Sig Dispense Refill   acetaminophen (TYLENOL) 325 MG tablet Take 2 tablets (650 mg total) by mouth every 6 (six) hours as needed for mild pain (or Fever >/= 101).     albuterol (VENTOLIN HFA) 108 (90 Base) MCG/ACT inhaler TAKE 2 PUFFS BY MOUTH EVERY 6 HOURS AS NEEDED FOR WHEEZE OR SHORTNESS OF BREATH 8.5 each 1   aspirin EC 81 MG tablet Take 81 mg by mouth at bedtime.     atorvastatin (LIPITOR) 80 MG tablet Take 1 tablet (80 mg total) by mouth daily at 6 PM. 90 tablet 3   budesonide-formoterol (SYMBICORT) 80-4.5 MCG/ACT inhaler Inhale 2 puffs into the lungs 2 (two) times daily. 3 each 1   Cholecalciferol (VITAMIN D3 PO) Take 2 capsules by mouth daily. (Patient not taking: Reported on 02/13/2022)     Cranberry 1000 MG CAPS Take 1,000 mg by mouth daily at 12 noon.     cyclobenzaprine (FLEXERIL) 10 MG tablet Take 1 tablet (10 mg  total) by mouth 3 (three) times daily as needed for muscle spasms. 30 tablet 1   docusate sodium (COLACE) 100 MG capsule Take 100-200 mg by mouth See admin instructions. Take 100 mg in the morning and 200 mg at bedtime     esomeprazole (NEXIUM) 20 MG capsule Take 1 capsule (20 mg total) by mouth 2 (two) times daily before a meal. 60 capsule 3   estradiol (ESTRACE) 0.1 MG/GM vaginal cream Place 0.5 g vaginally once a week. Place 0.5g twice a week at opening of vagina     ezetimibe (ZETIA) 10 MG tablet Take 1 tablet (10 mg total) by mouth daily. 90 tablet 3   ferrous sulfate 300 (60 Fe) MG/5ML syrup Take 5 mLs (300 mg total) by mouth daily with breakfast. 300 mL 5   hydrOXYzine (ATARAX) 10 MG tablet Take 1 tablet (10 mg  total) by mouth 3 (three) times daily as needed. 30 tablet 0   levothyroxine (SYNTHROID) 200 MCG tablet PLEASE ALTERNATE 175 MCG AND 200 MG DOSE TABLET EVERY OTHER DAY 30 tablet 1   lisinopril-hydrochlorothiazide (ZESTORETIC) 20-25 MG tablet Take 0.5 tablets by mouth 3 (three) times a week. Take on Monday Wednesday and Friday only 90 tablet 1   nitroGLYCERIN (NITROSTAT) 0.4 MG SL tablet Place 1 tablet (0.4 mg total) under the tongue every 5 (five) minutes x 3 doses as needed for chest pain. 25 tablet 3   OXYGEN Inhale into the lungs.     prochlorperazine (COMPAZINE) 10 MG tablet Take 1 tablet (10 mg total) by mouth every 6 (six) hours as needed for nausea or vomiting. 30 tablet 2   Trospium Chloride 60 MG CP24 Take 1 capsule (60 mg total) by mouth daily. 30 capsule 11   No current facility-administered medications for this visit.    SURGICAL HISTORY:  Past Surgical History:  Procedure Laterality Date   ANTERIOR AND POSTERIOR REPAIR WITH SACROSPINOUS FIXATION N/A 12/20/2020   Procedure: SACROSPINOUS LIGAMENT FIXATION;  Surgeon: [name], MD;  Location[facility];  Service: Gynecology;  Laterality: N/A;  Total time requested for all procedures is 2 hours   Pope age 55   lower   bartholin cyst removal  age 53's   BLADDER SUSPENSION N/A 12/20/2020   Procedure: TRANSVAGINAL TAPE (TVT) PROCEDURE;  Surgeon: [name], MD;  Location: [facility];  Service: Gynecology;  Laterality: N/A;   BRONCHIAL BRUSHINGS  07/15/2021   Procedure: BRONCHIAL BRUSHINGS;  Surgeon: Collene Gobble, MD;  Location: [facility] ENDOSCOPY;  Service: Pulmonary;;   BRONCHIAL NEEDLE ASPIRATION BIOPSY  07/15/2021   Procedure: BRONCHIAL NEEDLE ASPIRATION BIOPSIES;  Surgeon: Collene Gobble, MD;  Location: [facility] ENDOSCOPY;  Service: Pulmonary;;   CORONARY/GRAFT ACUTE MI REVASCULARIZATION N/A 10/26/2019   Procedure: Coronary/Graft Acute MI Revascularization;  Surgeon:  Sherren Mocha, MD;  Location: [facility] LAB;  Service: Cardiovascular;  Laterality: N/A;   CYSTOCELE REPAIR N/A 12/20/2020   Procedure: ANTERIOR AND POSTERIOR REPAIR WITH PERINEORRHAPHY;  Surgeon: [name], MD;  Location: [facility];  Service: Gynecology;  Laterality: N/A;   CYSTOSCOPY N/A 12/20/2020   Procedure: CYSTOSCOPY;  Surgeon: [name], MD;  Location: [facility];  Service: Gynecology;  Laterality: N/A;   ELBOW SURGERY  1990's   left   ENDOBRONCHIAL ULTRASOUND N/A 07/15/2021   Procedure: ENDOBRONCHIAL ULTRASOUND;  Surgeon: Collene Gobble, MD;  Location: [facility] ENDOSCOPY;  Service: Pulmonary;  Laterality: N/A;   HEMOSTASIS  CONTROL  07/15/2021   Procedure: HEMOSTASIS CONTROL;  Surgeon: Collene Gobble, MD;  Location[facility]COPY;  Service: Pulmonary;;   LEFT HEART CATH AND CORONARY ANGIOGRAPHY N/A 10/26/2019   Procedure: LEFT HEART CATH AND CORONARY ANGIOGRAPHY;  Surgeon: Sherren Mocha, MD;  Location: [facility];  Service: Cardiovascular;  Laterality: N/A;   TONSILLECTOMY      REVIEW OF SYSTEMS:  A comprehensive review of systems was negative except for: Constitutional: positive for fatigue Respiratory: positive for dyspnea on exertion Musculoskeletal: positive for muscle weakness   PHYSICAL EXAMINATION: General appearance: alert, cooperative, fatigued, and no distress Head: Normocephalic, without obvious abnormality, atraumatic Neck: no adenopathy, no JVD, supple, symmetrical, trachea midline, and thyroid not enlarged, symmetric, no tenderness/mass/nodules Lymph nodes: Cervical, supraclavicular, and axillary nodes normal. Resp: clear to auscultation bilaterally Back: symmetric, no curvature. ROM normal. No CVA tenderness. Cardio: regular rate and rhythm, S1, S2 normal, no murmur, click, rub or gallop GI: soft, non-tender; bowel sounds normal; no masses,  no organomegaly Extremities: extremities normal,  atraumatic, no cyanosis or edema  ECOG PERFORMANCE STATUS: 1 - Symptomatic but completely ambulatory  Blood pressure (!) 143/66, pulse 81, temperature (!) 97 F (36.1 C), temperature source Tympanic, resp. rate 17, weight 150 lb 1 oz (68.1 kg), SpO2 97 %.  LABORATORY DATA: Lab Results  Component Value Date   WBC 7.8 05/08/2022   HGB 7.6 (L) 05/08/2022   HCT 25.3 (L) 05/08/2022   MCV 84.3 05/08/2022   PLT 306 05/08/2022      Chemistry      Component Value Date/Time   NA 137 05/08/2022 1007   NA 137 08/28/2021 0923   K 4.3 05/08/2022 1007   CL 104 05/08/2022 1007   CO2 24 05/08/2022 1007   BUN 41 (H) 05/08/2022 1007   BUN 20 08/28/2021 0923   CREATININE 1.62 (H) 05/08/2022 1007   CREATININE 1.08 04/18/2014 1516   GLU 104 12/23/2017 1111      Component Value Date/Time   CALCIUM 9.2 05/08/2022 1007   ALKPHOS 67 05/08/2022 1007   AST 16 05/08/2022 1007   ALT 10 05/08/2022 1007   BILITOT 0.2 (L) 05/08/2022 1007       RADIOGRAPHIC STUDIES: No results found.  ASSESSMENT AND PLAN: This is a very pleasant 86 years old white female with stage IIIA (T2a, N2, M0) non-small cell lung cancer, adenocarcinoma presented with left lower lobe lung mass in addition to left hilar and mediastinal lymphadenopathy diagnosed in November 2022 with no actionable mutations The patient is currently undergoing a course of concurrent chemotherapy with radiation.  Her chemotherapy is in the form of carboplatin for AUC of 2 and paclitaxel 45 Mg/M2 status post 6 cycles.  She has been tolerating her treatment with concurrent chemoradiation fairly well except for the radiation-induced odynophagia and dysphagia. Her scan showed significant improvement in her disease with 65% reduction in the volume of the left lower lobe nodule.  She continues to have persistent left hilar and infrahilar adenopathy. The patient is currently undergoing treatment with consolidation immunotherapy with Imfinzi 1500 Mg IV every  4 weeks.  Status post 8 cycles. The patient has been tolerating this treatment well with no concerning adverse effects. I recommended for her to proceed with cycle #9 today as planned. I will see her back for follow-up visit in 4 weeks for evaluation before the next cycle of her treatment. For the anemia she received 2 units of PRBCs transfusion few weeks ago. The patient was advised  to call immediately if she has any concerning symptoms in the interval. The patient voices understanding of current disease status and treatment options and is in agreement with the current care plan.  All questions were answered. The patient knows to call the clinic with any problems, questions or concerns. We can certainly see the patient much sooner if necessary.  Disclaimer: This note was dictated with voice recognition software. Similar sounding words can inadvertently be transcribed and may not be corrected upon review.

## 2022-06-05 NOTE — Progress Notes (Signed)
Patient seen by  Inez Pilgrim are within treatment parameters.  Labs reviewed by  [facility]  and are not all within treatment parameters. Per Dr. Julien Nordmann it's ok to treat with creatinine 1.62.  Per physician team, patient is ready for treatment and there are NO modifications to the treatment plan.

## 2022-06-05 NOTE — Patient Instructions (Signed)
[facility]  Discharge Instructions: Thank you for choosing [facility] to provide your oncology and hematology care.   If you have a lab appointment with the [facility], please go directly to the [facility] and check in at the registration area.   Wear comfortable clothing and clothing appropriate for easy access to any Portacath or PICC line.   We strive to give you quality time with your provider. You may need to reschedule your appointment if you arrive late (15 or more minutes).  Arriving late affects you and other patients whose appointments are after yours.  Also, if you miss three or more appointments without notifying the office, you may be dismissed from the clinic at the provider's discretion.      For prescription refill requests, have your pharmacy contact our office and allow 72 hours for refills to be completed.    Today you received the following chemotherapy and/or immunotherapy agents: durvalumab      To help prevent nausea and vomiting after your treatment, we encourage you to take your nausea medication as directed.  BELOW ARE SYMPTOMS THAT SHOULD BE REPORTED IMMEDIATELY: *FEVER GREATER THAN 100.4 F (38 C) OR HIGHER *CHILLS OR SWEATING *NAUSEA AND VOMITING THAT IS NOT CONTROLLED WITH YOUR NAUSEA MEDICATION *UNUSUAL SHORTNESS OF BREATH *UNUSUAL BRUISING OR BLEEDING *URINARY PROBLEMS (pain or burning when urinating, or frequent urination) *BOWEL PROBLEMS (unusual diarrhea, constipation, pain near the anus) TENDERNESS IN MOUTH AND THROAT WITH OR WITHOUT PRESENCE OF ULCERS (sore throat, sores in mouth, or a toothache) UNUSUAL RASH, SWELLING OR PAIN  UNUSUAL VAGINAL DISCHARGE OR ITCHING   Items with * indicate a potential emergency and should be followed up as soon as possible or go to the Emergency Department if any problems should occur.  Please show the CHEMOTHERAPY ALERT CARD or IMMUNOTHERAPY ALERT CARD at check-in to  the Emergency Department and triage nurse.  Should you have questions after your visit or need to cancel or reschedule your appointment, please contact [facility]  Dept: [phone]  and follow the prompts.  Office hours are 8:00 a.m. to 4:30 p.m. Monday - Friday. Please note that voicemails left after 4:00 p.m. may not be returned until the following business day.  We are closed weekends and major holidays. You have access to a nurse at all times for urgent questions. Please call the main number to the clinic Dept: [phone] and follow the prompts.   For any non-urgent questions, you may also contact your provider using MyChart. We now offer e-Visits for anyone 24 and older to request care online for non-urgent symptoms. For details visit [email or web address].   Also download the MyChart app! Go to the app store, search "MyChart", open the app, select [facility], and log in with your MyChart username and password.  Masks are optional in the cancer centers. If you would like for your care team to wear a mask while they are taking care of you, please let them know. You may have one support person who is at least 84 years old accompany you for your appointments.

## 2022-06-06 LAB — T4: T4, Total: 8.2 ug/dL (ref 4.5–12.0)

## 2022-06-07 ENCOUNTER — Other Ambulatory Visit: Payer: Self-pay

## 2022-06-13 ENCOUNTER — Ambulatory Visit: Payer: Self-pay

## 2022-06-13 DIAGNOSIS — C3490 Malignant neoplasm of unspecified part of unspecified bronchus or lung: Secondary | ICD-10-CM

## 2022-06-13 DIAGNOSIS — I10 Essential (primary) hypertension: Secondary | ICD-10-CM

## 2022-06-13 DIAGNOSIS — J449 Chronic obstructive pulmonary disease, unspecified: Secondary | ICD-10-CM

## 2022-06-13 NOTE — Patient Instructions (Signed)
Visit Information  Thank you for taking time to visit with me today. Please don't hesitate to contact me if I can be of assistance to you.   Following are the goals we discussed today:   Goals Addressed               This Visit's Progress     Patient Stated     I need help with transportation (pt-stated)        Care Coordination Interventions: Assessed patient understanding of cancer diagnosis and recommended treatment plan Reviewed upcoming provider appointments and treatment appointments Assessed available transportation to appointments and treatments. Has consistent/reliable transportation: Yes Assessed support system. Has consistent/reliable family or other support: Yes Nutrition assessment performed Determined patient is now receiving Meals on Wheels, she feels she is eating a more balanced meals  Determined patient is getting help from the [facility] with transportation for her treatments, this transportation is provided through [facility]  Educated patient on use of her antiemetic, to take as prescribed and encouraged patient to notify her Cancer team if GI symptoms do not subside  Discussed upcoming PCP follow up scheduled with [name] FNP, set for 07/22/22 @8 :40 AM, determined patient does not have transportation for this appointment [facility] referral to assist with transportation for upcoming PCP office visit          Our next appointment is by telephone on 07/24/22 at 11:00 AM  Please call the care guide team at [phone] if you need to cancel or reschedule your appointment.   If you are experiencing a Mental Health or [place] or need someone to talk to, please call 1-800-273-TALK (toll free, 24 hour hotline) go to [facility] [phone])  The patient verbalized understanding of instructions, educational materials, and care plan provided today and  agreed to receive a mailed copy of patient instructions, educational materials, and care plan.   Barb Merino, RN, BSN, CCM Care Management Coordinator [facility] Care Management  Direct Phone: [phone]

## 2022-06-13 NOTE — Patient Outreach (Signed)
  Care Coordination   Follow Up Visit Note   06/13/2022 Name: Yetzali-ANNE POLIZZI MRN: [id number] DOB: September 30, 1935  MALEIAH DULA is a 86 y.o. year old female who sees [name], [place] for primary care. I spoke with  [name] by phone today.  What matters to the patients health and wellness today?  Patient is tolerating her Chemo treatments. She is now receiving Meals on Wheels.     Goals Addressed               This Visit's Progress     Patient Stated     I need help with transportation (pt-stated)        Care Coordination Interventions: Assessed patient understanding of cancer diagnosis and recommended treatment plan Reviewed upcoming provider appointments and treatment appointments Assessed available transportation to appointments and treatments. Has consistent/reliable transportation: Yes Assessed support system. Has consistent/reliable family or other support: Yes Nutrition assessment performed Determined patient is now receiving Meals on Wheels, she feels she is eating a more balanced meals  Determined patient is getting help from the [facility] with transportation for her treatments, this transportation is provided through [facility]  Educated patient on use of her antiemetic, to take as prescribed and encouraged patient to notify her Cancer team if GI symptoms do not subside  Discussed upcoming PCP follow up scheduled with [name] FNP, set for 07/22/22 @8 :40 AM, determined patient does not have transportation for this appointment [place] referral to assist with transportation for upcoming PCP office visit          SDOH assessments and interventions completed:  Yes   [place] referral sent to help with transportation for upcoming PCP office visit.   Care Coordination Interventions Activated:  Yes  Care Coordination Interventions:  Yes, provided   Follow up plan: Referral made to [facility] Follow up call scheduled for 07/24/22 @11 :00 AM    Encounter Outcome:  Pt. Visit Completed

## 2022-06-16 ENCOUNTER — Telehealth: Payer: Self-pay

## 2022-06-16 NOTE — Telephone Encounter (Signed)
   Telephone encounter was:  Unsuccessful.  06/16/2022 Name: Abigail [name] MRN: [id number] DOB: 04/28/1936  Unsuccessful outbound call made today to assist with:  Transportation Needs   Outreach Attempt:  1st Attempt  A HIPAA compliant voice message was left requesting a return call.  Instructed patient to call back   [place], [place] Management  [phone] [address], [place], [place] [address] Phone: [phone] Email: [email or web address]

## 2022-06-18 ENCOUNTER — Telehealth: Payer: Self-pay

## 2022-06-18 NOTE — Telephone Encounter (Signed)
   Telephone encounter was:  Successful.  06/18/2022 Name: GRAY MAUGERI MRN: [id number] DOB: 03-Mar-1936  Garvin Fila Kettering is a 86 y.o. year old female who is a primary care patient of [name], [place] . The community resource team was consulted for assistance with Transportation Needs   Care guide performed the following interventions: Patient provided with information about care guide support team and interviewed to confirm resource needs.Patient stated she is in need for transportation resources. I will be mailing resources for [employer], [place] [employer]. She has no Transportation needs right now but has all contact information for future needs  Follow Up Plan:  No further follow up planned at this time. The patient has been provided with needed resources.    [facility] [facility], Care Management  [phone] [address], [place], [place] [address] Phone: [phone] Email: [email or web address]

## 2022-06-19 DIAGNOSIS — C3490 Malignant neoplasm of unspecified part of unspecified bronchus or lung: Secondary | ICD-10-CM | POA: Diagnosis not present

## 2022-07-02 NOTE — Progress Notes (Unsigned)
[facility] OFFICE PROGRESS NOTE  [name], [address] [place] [address]  DIAGNOSIS: Stage IIIA (T2a, N2, M0) non-small cell lung cancer, adenocarcinoma presented with left lower lobe lung mass in addition to left hilar and mediastinal lymphadenopathy diagnosed in November 2022 with no actionable mutation on the guardant blood test   PRIOR THERAPY: Weekly concurrent chemoradiation with carboplatin for an AUC of 2 and paclitaxel 45 mg per metered squared.  First dose on 08/05/2021.  Status post 6 cycles.  Last dose was given on September 03, 2021.   CURRENT THERAPY: Consolidation treatment with immunotherapy with Imfinzi 1500 Mg IV every 4 weeks.  First dose October 23, 2021.  Status post 9 cycles.    INTERVAL HISTORY: Abigail [name] 86 y.o. female returns to the clinic today for a follow-up visit.  The patient is currently undergoing consolidation immunotherapy with Imfinzi.  She is status post 9 cycles.  She tolerates it well without any concerning adverse side effects.  She denies any fever, chills, or night sweats.  She gained a few pounds since last being seen. She reports stable dyspnea on exertion.  She is on 3 liters of supplemental oxygen via nasal cannula.  She sometimes has a cough which produces clear phlegm. Overall, her cough is unchanged. She has been using Delsym 12 hour, which has been helpful for her.  Denies any hemoptysis.  Denies any nausea or vomiting.  She reports some baseline constipation for which she takes colace 2 tablets at night and 1 tablet in the AM.  Denies any headache or visual changes.  Denies any rashes or skin changes.  The patient does have anemia for which she had stool cards performed which were positive for blood.  She was evaluated by GI who recommends close monitoring and supportive care with IV iron.  She received IV iron most recently on 05/22/22.  She is needing a refill of her ferrous sulfate today. She ran out of her BP  medication about 1 week ago that was prescribed in 2021 when she had a MI. She is scheduled to see her cardiologist next month and her PCP later this month. Her BP is 136/57 today without any anti-hypertensives. She is here today for evaluation and repeat blood work before undergoing cycle #10.    MEDICAL HISTORY: Past Medical History:  Diagnosis Date   Anemia    Arthritis    knees, back   Carotid artery occlusion    Chronic kidney disease    stage 3 ckd no nephrologist   Complete uterine prolapse with prolapse of anterior vaginal wall    Complication of anesthesia    hard to wake up per pt   Constipation    Coronary artery disease    Diverticulitis yrs ago coialitis   Dyspnea    History of blood transfusion    History of radiation therapy    right lung 08/05/2021-09/18/2021  Dr Gery Pray   Hypertension    Hypothyroid    Lung cancer ([facility])    Numbness    in hands at times   Pneumonia    Pre-diabetes    Scoliosis    STEMI (ST elevation myocardial infarction) ([facility]) 10/26/2019   DES RCA   Wears dentures    full dentures   Wears glasses    for reading    ALLERGIES:  is allergic to codeine and norvasc [amlodipine].  MEDICATIONS:  Current Outpatient Medications  Medication Sig Dispense Refill  acetaminophen (TYLENOL) 325 MG tablet Take 2 tablets (650 mg total) by mouth every 6 (six) hours as needed for mild pain (or Fever >/= 101).     albuterol (VENTOLIN HFA) 108 (90 Base) MCG/ACT inhaler TAKE 2 PUFFS BY MOUTH EVERY 6 HOURS AS NEEDED FOR WHEEZE OR SHORTNESS OF BREATH 8.5 each 1   aspirin EC 81 MG tablet Take 81 mg by mouth at bedtime.     atorvastatin (LIPITOR) 80 MG tablet Take 1 tablet (80 mg total) by mouth daily at 6 PM. 90 tablet 3   budesonide-formoterol (SYMBICORT) 80-4.5 MCG/ACT inhaler Inhale 2 puffs into the lungs 2 (two) times daily. 3 each 1   Cranberry 1000 MG CAPS Take 1,000 mg by mouth daily at 12 noon.     cyclobenzaprine (FLEXERIL) 10 MG tablet Take 1  tablet (10 mg total) by mouth 3 (three) times daily as needed for muscle spasms. 30 tablet 1   docusate sodium (COLACE) 100 MG capsule Take 100-200 mg by mouth See admin instructions. Take 100 mg in the morning and 200 mg at bedtime     esomeprazole (NEXIUM) 20 MG capsule Take 1 capsule (20 mg total) by mouth 2 (two) times daily before a meal. 60 capsule 3   estradiol (ESTRACE) 0.1 MG/GM vaginal cream Place 0.5 g vaginally once a week. Place 0.5g twice a week at opening of vagina     ezetimibe (ZETIA) 10 MG tablet Take 1 tablet (10 mg total) by mouth daily. 90 tablet 3   ferrous sulfate 325 (65 FE) MG EC tablet Take 1 tablet (325 mg total) by mouth daily with breakfast. 30 tablet 2   hydrOXYzine (ATARAX) 10 MG tablet Take 1 tablet (10 mg total) by mouth 3 (three) times daily as needed. 30 tablet 0   levothyroxine (SYNTHROID) 200 MCG tablet PLEASE ALTERNATE 175 MCG AND 200 MG DOSE TABLET EVERY OTHER DAY 30 tablet 1   nitroGLYCERIN (NITROSTAT) 0.4 MG SL tablet Place 1 tablet (0.4 mg total) under the tongue every 5 (five) minutes x 3 doses as needed for chest pain. 25 tablet 3   OXYGEN Inhale into the lungs.     Trospium Chloride 60 MG CP24 Take 1 capsule (60 mg total) by mouth daily. 30 capsule 11   Cholecalciferol (VITAMIN D3 PO) Take 2 capsules by mouth daily. (Patient not taking: Reported on 02/13/2022)     lisinopril-hydrochlorothiazide (ZESTORETIC) 20-25 MG tablet Take 0.5 tablets by mouth 3 (three) times a week. Take on Monday Wednesday and Friday only (Patient not taking: Reported on 07/03/2022) 90 tablet 1   prochlorperazine (COMPAZINE) 10 MG tablet Take 1 tablet (10 mg total) by mouth every 6 (six) hours as needed for nausea or vomiting. (Patient not taking: Reported on 07/03/2022) 30 tablet 2   No current facility-administered medications for this visit.    SURGICAL HISTORY:  Past Surgical History:  Procedure Laterality Date   ANTERIOR AND POSTERIOR REPAIR WITH SACROSPINOUS FIXATION N/A  12/20/2020   Procedure: SACROSPINOUS LIGAMENT FIXATION;  Surgeon: [name], MD;  Location[facility];  Service: Gynecology;  Laterality: N/A;  Total time requested for all procedures is 2 hours   [place] age 53   lower   bartholin cyst removal  age 42's   BLADDER SUSPENSION N/A 12/20/2020   Procedure: TRANSVAGINAL TAPE (TVT) PROCEDURE;  Surgeon: [name], MD;  Location: [facility];  Service: Gynecology;  Laterality: N/A;   BRONCHIAL BRUSHINGS  07/15/2021  Procedure: BRONCHIAL BRUSHINGS;  Surgeon: Collene Gobble, MD;  Location: [facility] ENDOSCOPY;  Service: Pulmonary;;   BRONCHIAL NEEDLE ASPIRATION BIOPSY  07/15/2021   Procedure: BRONCHIAL NEEDLE ASPIRATION BIOPSIES;  Surgeon: Collene Gobble, MD;  Location[facility] ENDOSCOPY;  Service: Pulmonary;;   CORONARY/GRAFT ACUTE MI REVASCULARIZATION N/A 10/26/2019   Procedure: Coronary/Graft Acute MI Revascularization;  Surgeon: Sherren Mocha, MD;  Location: [facility] CV LAB;  Service: Cardiovascular;  Laterality: N/A;   CYSTOCELE REPAIR N/A 12/20/2020   Procedure: ANTERIOR AND POSTERIOR REPAIR WITH PERINEORRHAPHY;  Surgeon: [name], MD;  Location: [facility];  Service: Gynecology;  Laterality: N/A;   CYSTOSCOPY N/A 12/20/2020   Procedure: CYSTOSCOPY;  Surgeon: [name], MD;  Location: [facility];  Service: Gynecology;  Laterality: N/A;   ELBOW SURGERY  1990's   left   ENDOBRONCHIAL ULTRASOUND N/A 07/15/2021   Procedure: ENDOBRONCHIAL ULTRASOUND;  Surgeon: Collene Gobble, MD;  Location: [facility] ENDOSCOPY;  Service: Pulmonary;  Laterality: N/A;   HEMOSTASIS CONTROL  07/15/2021   Procedure: HEMOSTASIS CONTROL;  Surgeon: Collene Gobble, MD;  Location: [facility] ENDOSCOPY;  Service: Pulmonary;;   LEFT HEART CATH AND CORONARY ANGIOGRAPHY N/A 10/26/2019   Procedure: LEFT HEART CATH AND CORONARY ANGIOGRAPHY;  Surgeon: Sherren Mocha, MD;   Location: [facility] CV LAB;  Service: Cardiovascular;  Laterality: N/A;   TONSILLECTOMY      REVIEW OF SYSTEMS:   Review of Systems  Constitutional: Negative for appetite change, chills, fatigue, fever and unexpected weight change.  HENT:   Negative for mouth sores, nosebleeds, sore throat and trouble swallowing.   Eyes: Negative for eye problems and icterus.  Respiratory: Positive for stable dyspnea on exertion and mild intermittent cough. Negative for  hemoptysis and wheezing.   Cardiovascular: Negative for chest pain and leg swelling.  Gastrointestinal: Positive for mild constipation. Negative for abdominal pain, diarrhea, nausea and vomiting.  Genitourinary: Negative for bladder incontinence, difficulty urinating, dysuria, frequency and hematuria.   Musculoskeletal: Negative for back pain, gait problem, neck pain and neck stiffness.  Skin: Negative for itching and rash.  Neurological: Negative for dizziness, extremity weakness, gait problem, headaches, light-headedness and seizures.  Hematological: Negative for adenopathy. Does not bruise/bleed easily.  Psychiatric/Behavioral: Negative for confusion, depression and sleep disturbance. The patient is not nervous/anxious.     PHYSICAL EXAMINATION:  Blood pressure (!) 136/57, pulse 76, temperature 98.3 F (36.8 C), temperature source Oral, resp. rate 18, weight 153 lb (69.4 kg), SpO2 98 %.  ECOG PERFORMANCE STATUS: 2  Physical Exam  Constitutional: Oriented to person, place, and time and well-developed, well-nourished, and in no distress.  HENT:  Head: Normocephalic and atraumatic.  Mouth/Throat: Oropharynx is clear and moist. No oropharyngeal exudate.  Eyes: Conjunctivae are normal. Right eye exhibits no discharge. Left eye exhibits no discharge. No scleral icterus.  Neck: Normal range of motion. Neck supple.  Cardiovascular: Normal rate, regular rhythm, normal heart sounds and intact distal pulses.   Pulmonary/Chest: Effort  normal and breath sounds normal. No respiratory distress. No rales.  On supplemental oxygen via nasal cannula. Abdominal: Soft. Bowel sounds are normal. Exhibits no distension and no mass. There is no tenderness.  Musculoskeletal: Normal range of motion. Exhibits no edema.  Lymphadenopathy: No cervical adenopathy.  Neurological: Alert and oriented to person, place, and time. Examined in the wheelchair. Exhibits muscle wasting.  Skin: Skin is warm and dry. No rash noted. Not diaphoretic. No erythema. No pallor.  Psychiatric: Mood, memory and judgment normal.  Vitals reviewed.    LABORATORY DATA: Lab Results  Component Value Date   WBC 7.6 07/03/2022   HGB 9.0 (L) 07/03/2022   HCT 29.1 (L) 07/03/2022   MCV 90.4 07/03/2022   PLT 303 07/03/2022      Chemistry      Component Value Date/Time   NA 133 (L) 07/03/2022 1357   NA 137 08/28/2021 0923   K 5.1 07/03/2022 1357   CL 101 07/03/2022 1357   CO2 25 07/03/2022 1357   BUN 33 (H) 07/03/2022 1357   BUN 20 08/28/2021 0923   CREATININE 1.38 (H) 07/03/2022 1357   CREATININE 1.08 04/18/2014 1516   GLU 104 12/23/2017 1111      Component Value Date/Time   CALCIUM 9.3 07/03/2022 1357   ALKPHOS 70 07/03/2022 1357   AST 9 (L) 07/03/2022 1357   ALT 7 07/03/2022 1357   BILITOT 0.2 (L) 07/03/2022 1357       RADIOGRAPHIC STUDIES:  No results found.   ASSESSMENT/PLAN:  This is a very pleasant 86 year old Caucasian female diagnosed with stage IIIa (T2a, N2, M0) non-small cell lung cancer, adenocarcinoma.  The patient presented with a left lower lobe lung mass in addition to left hilar mediastinal lymphadenopathy.  She was diagnosed in November 2022.  The patient did not have any actionable mutations by guardant 360 blood test.    The patient completed a course of concurrent chemotherapy with radiation.  Her chemotherapy is in the form of carboplatin for AUC of 2 and paclitaxel 45 Mg/M2 status post 6 cycles.  She had been tolerating her  treatment with concurrent chemoradiation fairly well except for the radiation-induced odynophagia and dysphagia which has since resolved.    Her initial scan showed significant improvement in her disease with 65% reduction in the volume of the left lower lobe nodule.  She continued to have persistent left hilar and infrahilar adenopathy.   The patient is currently undergoing treatment with consolidation immunotherapy with Imfinzi 1500 Mg IV every 4 weeks.  Status post 9 cycles.   Labs were reviewed.  Recommend that she proceed with cycle #10 today scheduled.  We will see her back for follow-up visit in 4 weeks for evaluation and repeat blood work before starting cycle #11.  I will arrange for restaging CT scan the chest prior to her next cycle of treatment. I will order without contrast due to her renal insuffiencey.  The patient has anemia.  She was seen by GI who recommends monitoring and supportive care for now.  Her most recent iron infusion was on 05/22/2022. I have refilled her iron.   She was instructed to call her PCP or cardiologist for a refill of her antihypertensves if warranted. I recommended she check her BP at home and keep a log of her readings to see if needed. She will share this with her PCP.   She will continue to use supplemental oxygen.  The patient was advised to call immediately if she has any concerning symptoms in the interval. The patient voices understanding of current disease status and treatment options and is in agreement with the current care plan. All questions were answered. The patient knows to call the clinic with any problems, questions or concerns. We can certainly see the patient much sooner if necessary          Orders Placed This Encounter  Procedures   CT Chest Wo Contrast    Standing Status:   Future    Standing Expiration Date:  07/03/2023    Order Specific Question:   Preferred imaging location?    Answer:   [facility]      The total time spent in the appointment was 20-29 minutes  Kale Dols L Bosten Newstrom, PA-C 07/03/22

## 2022-07-03 ENCOUNTER — Inpatient Hospital Stay: Payer: PPO | Attending: Internal Medicine

## 2022-07-03 ENCOUNTER — Inpatient Hospital Stay: Payer: PPO

## 2022-07-03 ENCOUNTER — Other Ambulatory Visit: Payer: Self-pay

## 2022-07-03 ENCOUNTER — Inpatient Hospital Stay (HOSPITAL_BASED_OUTPATIENT_CLINIC_OR_DEPARTMENT_OTHER): Payer: PPO | Admitting: Physician Assistant

## 2022-07-03 VITALS — BP 172/64 | HR 83 | Temp 97.5°F | Resp 18

## 2022-07-03 VITALS — BP 136/57 | HR 76 | Temp 98.3°F | Resp 18 | Wt 153.0 lb

## 2022-07-03 DIAGNOSIS — C3432 Malignant neoplasm of lower lobe, left bronchus or lung: Secondary | ICD-10-CM | POA: Insufficient documentation

## 2022-07-03 DIAGNOSIS — Z5112 Encounter for antineoplastic immunotherapy: Secondary | ICD-10-CM | POA: Insufficient documentation

## 2022-07-03 DIAGNOSIS — Z79899 Other long term (current) drug therapy: Secondary | ICD-10-CM | POA: Diagnosis not present

## 2022-07-03 DIAGNOSIS — D649 Anemia, unspecified: Secondary | ICD-10-CM | POA: Insufficient documentation

## 2022-07-03 DIAGNOSIS — C3490 Malignant neoplasm of unspecified part of unspecified bronchus or lung: Secondary | ICD-10-CM

## 2022-07-03 LAB — CMP (CANCER CENTER ONLY)
ALT: 7 U/L (ref 0–44)
AST: 9 U/L — ABNORMAL LOW (ref 15–41)
Albumin: 3.7 g/dL (ref 3.5–5.0)
Alkaline Phosphatase: 70 U/L (ref 38–126)
Anion gap: 7 (ref 5–15)
BUN: 33 mg/dL — ABNORMAL HIGH (ref 8–23)
CO2: 25 mmol/L (ref 22–32)
Calcium: 9.3 mg/dL (ref 8.9–10.3)
Chloride: 101 mmol/L (ref 98–111)
Creatinine: 1.38 mg/dL — ABNORMAL HIGH (ref 0.44–1.00)
GFR, Estimated: 38 mL/min — ABNORMAL LOW (ref >60)
Glucose, Bld: 120 mg/dL — ABNORMAL HIGH (ref 70–99)
Potassium: 5.1 mmol/L (ref 3.5–5.1)
Sodium: 133 mmol/L — ABNORMAL LOW (ref 135–145)
Total Bilirubin: 0.2 mg/dL — ABNORMAL LOW (ref 0.3–1.2)
Total Protein: 7.6 g/dL (ref 6.5–8.1)

## 2022-07-03 LAB — CBC WITH DIFFERENTIAL (CANCER CENTER ONLY)
Abs Immature Granulocytes: 0.03 10*3/uL (ref 0.00–0.07)
Basophils Absolute: 0 10*3/uL (ref 0.0–0.1)
Basophils Relative: 0 %
Eosinophils Absolute: 0.2 10*3/uL (ref 0.0–0.5)
Eosinophils Relative: 2 %
HCT: 29.1 % — ABNORMAL LOW (ref 36.0–46.0)
Hemoglobin: 9 g/dL — ABNORMAL LOW (ref 12.0–15.0)
Immature Granulocytes: 0 %
Lymphocytes Relative: 4 %
Lymphs Abs: 0.3 10*3/uL — ABNORMAL LOW (ref 0.7–4.0)
MCH: 28 pg (ref 26.0–34.0)
MCHC: 30.9 g/dL (ref 30.0–36.0)
MCV: 90.4 fL (ref 80.0–100.0)
Monocytes Absolute: 0.8 10*3/uL (ref 0.1–1.0)
Monocytes Relative: 10 %
Neutro Abs: 6.3 10*3/uL (ref 1.7–7.7)
Neutrophils Relative %: 84 %
Platelet Count: 303 10*3/uL (ref 150–400)
RBC: 3.22 MIL/uL — ABNORMAL LOW (ref 3.87–5.11)
RDW: 20.6 % — ABNORMAL HIGH (ref 11.5–15.5)
WBC Count: 7.6 10*3/uL (ref 4.0–10.5)
nRBC: 0 % (ref 0.0–0.2)

## 2022-07-03 LAB — TSH: TSH: 0.522 u[IU]/mL (ref 0.350–4.500)

## 2022-07-03 LAB — SAMPLE TO BLOOD BANK

## 2022-07-03 MED ORDER — FERROUS SULFATE 325 (65 FE) MG PO TBEC: 325.0000 mg | DELAYED_RELEASE_TABLET | Freq: Every day | ORAL | 2 refills | Status: DC

## 2022-07-03 MED ORDER — SODIUM CHLORIDE 0.9 % IV SOLN
1500.0000 mg | Freq: Once | INTRAVENOUS | Status: AC
  Administered 2022-07-03: 1500 mg via INTRAVENOUS
  Filled 2022-07-03: qty 30

## 2022-07-03 MED ORDER — SODIUM CHLORIDE 0.9 % IV SOLN: Freq: Once | INTRAVENOUS | Status: AC

## 2022-07-03 NOTE — Patient Instructions (Signed)
[facility]  Discharge Instructions: Thank you for choosing [facility] to provide your oncology and hematology care.   If you have a lab appointment with the [facility], please go directly to the [facility] and check in at the registration area.   Wear comfortable clothing and clothing appropriate for easy access to any Portacath or PICC line.   We strive to give you quality time with your provider. You may need to reschedule your appointment if you arrive late (15 or more minutes).  Arriving late affects you and other patients whose appointments are after yours.  Also, if you miss three or more appointments without notifying the office, you may be dismissed from the clinic at the provider's discretion.      For prescription refill requests, have your pharmacy contact our office and allow 72 hours for refills to be completed.    Today you received the following chemotherapy and/or immunotherapy agents: Durvalumab      To help prevent nausea and vomiting after your treatment, we encourage you to take your nausea medication as directed.  BELOW ARE SYMPTOMS THAT SHOULD BE REPORTED IMMEDIATELY: *FEVER GREATER THAN 100.4 F (38 C) OR HIGHER *CHILLS OR SWEATING *NAUSEA AND VOMITING THAT IS NOT CONTROLLED WITH YOUR NAUSEA MEDICATION *UNUSUAL SHORTNESS OF BREATH *UNUSUAL BRUISING OR BLEEDING *URINARY PROBLEMS (pain or burning when urinating, or frequent urination) *BOWEL PROBLEMS (unusual diarrhea, constipation, pain near the anus) TENDERNESS IN MOUTH AND THROAT WITH OR WITHOUT PRESENCE OF ULCERS (sore throat, sores in mouth, or a toothache) UNUSUAL RASH, SWELLING OR PAIN  UNUSUAL VAGINAL DISCHARGE OR ITCHING   Items with * indicate a potential emergency and should be followed up as soon as possible or go to the Emergency Department if any problems should occur.  Please show the CHEMOTHERAPY ALERT CARD or IMMUNOTHERAPY ALERT CARD at check-in to  the Emergency Department and triage nurse.  Should you have questions after your visit or need to cancel or reschedule your appointment, please contact [facility]  Dept: [phone]  and follow the prompts.  Office hours are 8:00 a.m. to 4:30 p.m. Monday - Friday. Please note that voicemails left after 4:00 p.m. may not be returned until the following business day.  We are closed weekends and major holidays. You have access to a nurse at all times for urgent questions. Please call the main number to the clinic Dept: [phone] and follow the prompts.   For any non-urgent questions, you may also contact your provider using MyChart. We now offer e-Visits for anyone 96 and older to request care online for non-urgent symptoms. For details visit [email or web address].   Also download the MyChart app! Go to the app store, search "MyChart", open the app, select [facility], and log in with your MyChart username and password.  Masks are optional in the cancer centers. If you would like for your care team to wear a mask while they are taking care of you, please let them know. You may have one support person who is at least 84 years old accompany you for your appointments.

## 2022-07-03 NOTE — Progress Notes (Signed)
Patient discharged in stable condition. BP elevated - patient denied any headache or chest pain. Patient transported via wheelchair to lobby.

## 2022-07-08 ENCOUNTER — Other Ambulatory Visit: Payer: Self-pay | Admitting: Nurse Practitioner

## 2022-07-08 DIAGNOSIS — E039 Hypothyroidism, unspecified: Secondary | ICD-10-CM

## 2022-07-20 DIAGNOSIS — C3490 Malignant neoplasm of unspecified part of unspecified bronchus or lung: Secondary | ICD-10-CM | POA: Diagnosis not present

## 2022-07-22 ENCOUNTER — Encounter: Payer: Self-pay | Admitting: Nurse Practitioner

## 2022-07-22 ENCOUNTER — Ambulatory Visit (INDEPENDENT_AMBULATORY_CARE_PROVIDER_SITE_OTHER): Payer: PPO | Admitting: Nurse Practitioner

## 2022-07-22 VITALS — BP 130/70 | HR 88 | Temp 98.8°F | Resp 97 | Ht 66.0 in | Wt 152.0 lb

## 2022-07-22 DIAGNOSIS — E785 Hyperlipidemia, unspecified: Secondary | ICD-10-CM

## 2022-07-22 DIAGNOSIS — J449 Chronic obstructive pulmonary disease, unspecified: Secondary | ICD-10-CM

## 2022-07-22 DIAGNOSIS — Z0000 Encounter for general adult medical examination without abnormal findings: Secondary | ICD-10-CM

## 2022-07-22 DIAGNOSIS — D229 Melanocytic nevi, unspecified: Secondary | ICD-10-CM

## 2022-07-22 DIAGNOSIS — R7303 Prediabetes: Secondary | ICD-10-CM

## 2022-07-22 DIAGNOSIS — G8929 Other chronic pain: Secondary | ICD-10-CM | POA: Diagnosis not present

## 2022-07-22 DIAGNOSIS — I10 Essential (primary) hypertension: Secondary | ICD-10-CM

## 2022-07-22 DIAGNOSIS — M546 Pain in thoracic spine: Secondary | ICD-10-CM

## 2022-07-22 DIAGNOSIS — E039 Hypothyroidism, unspecified: Secondary | ICD-10-CM | POA: Diagnosis not present

## 2022-07-22 DIAGNOSIS — R079 Chest pain, unspecified: Secondary | ICD-10-CM | POA: Diagnosis not present

## 2022-07-22 LAB — POCT URINALYSIS DIPSTICK
Bilirubin, UA: NEGATIVE
Glucose, UA: NEGATIVE
Ketones, UA: NEGATIVE
Nitrite, UA: NEGATIVE
Protein, UA: POSITIVE — AB
Spec Grav, UA: 1.025 (ref 1.010–1.025)
Urobilinogen, UA: 0.2 E.U./dL
pH, UA: 7 (ref 5.0–8.0)

## 2022-07-22 MED ORDER — LISINOPRIL-HYDROCHLOROTHIAZIDE 10-12.5 MG PO TABS: 1.0000 | ORAL_TABLET | Freq: Every day | ORAL | 0 refills | Status: DC

## 2022-07-22 MED ORDER — BUDESONIDE-FORMOTEROL FUMARATE 80-4.5 MCG/ACT IN AERO: 2.0000 | INHALATION_SPRAY | Freq: Two times a day (BID) | RESPIRATORY_TRACT | 1 refills | Status: DC

## 2022-07-22 MED ORDER — NITROGLYCERIN 0.4 MG SL SUBL: 0.4000 mg | SUBLINGUAL_TABLET | SUBLINGUAL | 3 refills | Status: DC | PRN

## 2022-07-22 MED ORDER — CYCLOBENZAPRINE HCL 10 MG PO TABS: 10.0000 mg | ORAL_TABLET | Freq: Three times a day (TID) | ORAL | 1 refills | Status: DC | PRN

## 2022-07-22 NOTE — Progress Notes (Signed)
I,Abigail [name],acting as a Education administrator for [employer], FNP.,have documented all relevant documentation on the behalf of Abigail Brine, FNP,as directed by  Abigail Brine, FNP while in the presence of Abigail [name], Abigail [name].  Subjective:     Patient ID: Abigail [name] , female    DOB: [birth year] , 86 y.o.   MRN: [id number]   Chief Complaint  Patient presents with   Annual Exam    HPI  Patient presents today for HM. She did not eat last night so she is a little shaky. She is due to have a CT scan in December to check the progress. She is due to see Cardiology in December. She is getting meals on wheels      Past Medical History:  Diagnosis Date   Anemia    Arthritis    knees, back   Carotid artery occlusion    Chronic kidney disease    stage 3 ckd no nephrologist   Complete uterine prolapse with prolapse of anterior vaginal wall    Complication of anesthesia    hard to wake up per pt   Constipation    Coronary artery disease    Diverticulitis yrs ago coialitis   Dyspnea    History of blood transfusion    History of radiation therapy    right lung 08/05/2021-09/18/2021  Dr Abigail [name]   Hypertension    Hypothyroid    Lung cancer ([place])    Numbness    in hands at times   Pneumonia    Pre-diabetes    Scoliosis    STEMI (ST elevation myocardial infarction) ([place]) 10/26/2019   DES RCA   Wears dentures    full dentures   Wears glasses    for reading     Family History  Problem Relation Age of Onset   Hypertension Mother    Colon cancer Neg Hx    Stomach cancer Neg Hx    Esophageal cancer Neg Hx      Current Outpatient Medications:    lisinopril-hydrochlorothiazide (ZESTORETIC) 10-12.5 MG tablet, Take 1 tablet by mouth daily., Disp: 36 tablet, Rfl: 0   acetaminophen (TYLENOL) 325 MG tablet, Take 2 tablets (650 mg total) by mouth every 6 (six) hours as needed for mild pain (or Fever >/= 101)., Disp: , Rfl:    albuterol (VENTOLIN HFA) 108 (90 Base) MCG/ACT inhaler, TAKE 2  PUFFS BY MOUTH EVERY 6 HOURS AS NEEDED FOR WHEEZE OR SHORTNESS OF BREATH, Disp: 8.5 each, Rfl: 1   aspirin EC 81 MG tablet, Take 81 mg by mouth at bedtime., Disp: , Rfl:    atorvastatin (LIPITOR) 80 MG tablet, Take 1 tablet (80 mg total) by mouth daily at 6 PM. (Patient not taking: Reported on 07/31/2022), Disp: 90 tablet, Rfl: 3   budesonide-formoterol (SYMBICORT) 80-4.5 MCG/ACT inhaler, Inhale 2 puffs into the lungs 2 (two) times daily., Disp: 3 each, Rfl: 1   Cholecalciferol (VITAMIN D3 PO), Take 2 capsules by mouth daily., Disp: , Rfl:    Cranberry 1000 MG CAPS, Take 1,000 mg by mouth daily at 12 noon., Disp: , Rfl:    cyclobenzaprine (FLEXERIL) 10 MG tablet, Take 1 tablet (10 mg total) by mouth 3 (three) times daily as needed for muscle spasms., Disp: 30 tablet, Rfl: 1   docusate sodium (COLACE) 100 MG capsule, Take 100-200 mg by mouth See admin instructions. Take 100 mg in the morning and 200 mg at bedtime, Disp: , Rfl:    esomeprazole (NEXIUM) 20 MG capsule, Take  1 capsule (20 mg total) by mouth 2 (two) times daily before a meal. (Patient not taking: Reported on 07/31/2022), Disp: 60 capsule, Rfl: 3   estradiol (ESTRACE) 0.1 MG/GM vaginal cream, Place 0.5 g vaginally once a week. Place 0.5g twice a week at opening of vagina (Patient not taking: Reported on 07/31/2022), Disp: , Rfl:    ezetimibe (ZETIA) 10 MG tablet, Take 1 tablet (10 mg total) by mouth daily., Disp: 90 tablet, Rfl: 3   ferrous sulfate 325 (65 FE) MG EC tablet, Take 1 tablet (325 mg total) by mouth daily with breakfast., Disp: 30 tablet, Rfl: 2   hydrOXYzine (ATARAX) 10 MG tablet, Take 1 tablet (10 mg total) by mouth 3 (three) times daily as needed., Disp: 30 tablet, Rfl: 0   levothyroxine (SYNTHROID) 200 MCG tablet, PLEASE ALTERNATE 175 MCG AND 200 MG DOSE TABLET EVERY OTHER DAY, Disp: 30 tablet, Rfl: 1   nitroGLYCERIN (NITROSTAT) 0.4 MG SL tablet, Place 1 tablet (0.4 mg total) under the tongue every 5 (five) minutes x 3 doses as  needed for chest pain. (Patient not taking: Reported on 07/31/2022), Disp: 25 tablet, Rfl: 3   OXYGEN, Inhale into the lungs., Disp: , Rfl:    prochlorperazine (COMPAZINE) 10 MG tablet, Take 1 tablet (10 mg total) by mouth every 6 (six) hours as needed for nausea or vomiting., Disp: 30 tablet, Rfl: 2   Trospium Chloride 60 MG CP24, Take 1 capsule (60 mg total) by mouth daily., Disp: 30 capsule, Rfl: 11   Allergies  Allergen Reactions   Codeine Nausea And Vomiting   Norvasc [Amlodipine] Rash and Other (See Comments)    rash      The patient states she is post menopausal status.  No LMP recorded. Patient is postmenopausal.  Negative for Dysmenorrhea and Negative for Menorrhagia. Negative for: breast discharge, breast lump(s), breast pain and breast self exam. Associated symptoms include abnormal vaginal bleeding. Pertinent negatives include abnormal bleeding (hematology), anxiety, decreased libido, depression, difficulty falling sleep, dyspareunia, history of infertility, nocturia, sexual dysfunction, sleep disturbances, urinary incontinence, urinary urgency, vaginal discharge and vaginal itching. Diet regular.The patient states her exercise level is minimal she will walk her dog. When she tries to do housework and it will make her tired for 3 days.   The patient's tobacco use is:  Social History   Tobacco Use  Smoking Status Former   Packs/day: 1.50   Years: 35.00   Total pack years: 52.50   Types: Cigarettes   Quit date: 04/05/1986   Years since quitting: 36.3  Smokeless Tobacco Never   She has been exposed to passive smoke. The patient's alcohol use is:  Social History   Substance and Sexual Activity  Alcohol Use No    Review of Systems  Constitutional: Negative.   HENT: Negative.    Eyes: Negative.   Respiratory: Negative.         Wearing oxygen around the clock  Cardiovascular: Negative.   Gastrointestinal: Negative.   Endocrine: Negative.   Genitourinary: Negative.    Musculoskeletal: Negative.        Left back pain  Skin: Negative.   Allergic/Immunologic: Negative.   Neurological: Negative.   Hematological: Negative.   Psychiatric/Behavioral: Negative.       Today's Vitals   07/22/22 0845  BP: 130/70  Pulse: 88  Resp: (!) 97  Temp: 98.8 F (37.1 C)  TempSrc: Oral  Weight: 152 lb (68.9 kg)  Height: 5\' 6"  (1.676 m)   Body mass index  is 24.53 kg/m.  Wt Readings from Last 3 Encounters:  07/31/22 150 lb 9 oz (68.3 kg)  07/22/22 152 lb (68.9 kg)  07/03/22 153 lb (69.4 kg)    Objective:  Physical Exam Vitals reviewed.  Constitutional:      General: She is not in acute distress.    Appearance: Normal appearance. She is well-developed. She is obese.  HENT:     Head: Normocephalic and atraumatic.     Right Ear: Hearing, tympanic membrane, ear canal and external ear normal. There is no impacted cerumen.     Left Ear: Hearing, tympanic membrane, ear canal and external ear normal. There is no impacted cerumen.     Nose:     Comments: Deferred - masked    Mouth/Throat:     Comments: Deferred - masked Eyes:     General: Lids are normal.     Extraocular Movements: Extraocular movements intact.     Conjunctiva/sclera: Conjunctivae normal.     Pupils: Pupils are equal, round, and reactive to light.     Funduscopic exam:    Right eye: No papilledema.        Left eye: No papilledema.  Neck:     Thyroid: No thyroid mass.     Vascular: No carotid bruit.  Cardiovascular:     Rate and Rhythm: Normal rate and regular rhythm.     Pulses: Normal pulses.     Heart sounds: Normal heart sounds. No murmur heard. Pulmonary:     Effort: Pulmonary effort is normal. No respiratory distress.     Breath sounds: Normal breath sounds. No wheezing.  Chest:     Chest wall: No mass.  Breasts:    Tanner Score is 5.     Right: Normal. No mass or tenderness.     Left: Normal. No mass or tenderness.  Abdominal:     General: Abdomen is flat. Bowel sounds  are normal. There is no distension.     Palpations: Abdomen is soft.     Tenderness: There is no abdominal tenderness.  Musculoskeletal:        General: Tenderness (left back lung pain/discomfort) present. No swelling. Normal range of motion.     Cervical back: Full passive range of motion without pain, normal range of motion and neck supple.     Right lower leg: No edema.     Left lower leg: No edema.  Lymphadenopathy:     Upper Body:     Right upper body: No supraclavicular, axillary or pectoral adenopathy.     Left upper body: No supraclavicular, axillary or pectoral adenopathy.  Skin:    General: Skin is warm and dry.     Capillary Refill: Capillary refill takes less than 2 seconds.     Findings: Lesion (abnormal nevi to posterior back on right) present.     Comments: She has multiple abnormal shaped nevi. Daughter has a history of myeloma per patient  Neurological:     General: No focal deficit present.     Mental Status: She is alert and oriented to person, place, and time.     Cranial Nerves: No cranial nerve deficit.     Sensory: No sensory deficit.  Psychiatric:        Mood and Affect: Mood normal.        Behavior: Behavior normal.        Thought Content: Thought content normal.        Judgment: Judgment normal.  Assessment And Plan:     1. Encounter for general adult medical examination w/o abnormal findings Behavior modifications discussed and diet history reviewed.   Pt will continue to exercise regularly and modify diet with low GI, plant based foods and decrease intake of processed foods.  Recommend intake of daily multivitamin, Vitamin D, and calcium.  Recommend mammogram and colonoscopy for preventive screenings, as well as recommend immunizations that include influenza, TDAP, and Shingles (up to date)  2. Essential hypertension Comments: Blood pressure is fairly controlled. Continue current medications - POCT Urinalysis Dipstick (81002) - Microalbumin  / Creatinine Urine Ratio - lisinopril-hydrochlorothiazide (ZESTORETIC) 10-12.5 MG tablet; Take 1 tablet by mouth daily.  Dispense: 36 tablet; Refill: 0  3. Acquired hypothyroidism Comments: Stable, will check thyroid levels.  4. Hyperlipidemia, unspecified hyperlipidemia type Comments: Continue statin, tolerating well. - Lipid panel  5. Prediabetes Comments: Stable, no current medications. Focus on diet low in sugar and starches - Hemoglobin A1c  6. Chronic obstructive pulmonary disease, unspecified COPD type ([place]) Comments: Continue f/u with Pulmonology. Will refill her Symbicort - budesonide-formoterol (SYMBICORT) 80-4.5 MCG/ACT inhaler; Inhale 2 puffs into the lungs 2 (two) times daily.  Dispense: 3 each; Refill: 1  7. Chest pain with moderate risk for cardiac etiology Comments: She needed a new Rx of Nitroglycerin tabs - nitroGLYCERIN (NITROSTAT) 0.4 MG SL tablet; Place 1 tablet (0.4 mg total) under the tongue every 5 (five) minutes x 3 doses as needed for chest pain. (Patient not taking: Reported on 07/31/2022)  Dispense: 25 tablet; Refill: 3  8. Chronic obstructive pulmonary disease, unspecified COPD type ([place]) Comments: She is using her albuterol inhaler 2 times a day for the last month, will send symbicort and f/u in 4-6 weeks.  - budesonide-formoterol (SYMBICORT) 80-4.5 MCG/ACT inhaler; Inhale 2 puffs into the lungs 2 (two) times daily.  Dispense: 3 each; Refill: 1  9. Atypical nevi Comments: She has multiple atypical nevi to her chest and back, will refer to Dermatology - Ambulatory referral to Dermatology  10. Chronic left-sided thoracic back pain - cyclobenzaprine (FLEXERIL) 10 MG tablet; Take 1 tablet (10 mg total) by mouth 3 (three) times daily as needed for muscle spasms.  Dispense: 30 tablet; Refill: 1   Blood sugar in office was 139.   Patient was given opportunity to ask questions. Patient verbalized understanding of the plan and was able to repeat key elements of  the plan. All questions were answered to their satisfaction.   Abigail Brine, FNP   I, Abigail Brine, FNP, have reviewed all documentation for this visit. The documentation on 07/22/22 for the exam, diagnosis, procedures, and orders are all accurate and complete.   THE PATIENT IS ENCOURAGED TO PRACTICE SOCIAL DISTANCING DUE TO THE COVID-19 PANDEMIC.

## 2022-07-22 NOTE — Patient Instructions (Signed)

## 2022-07-24 ENCOUNTER — Ambulatory Visit: Payer: Self-pay

## 2022-07-24 ENCOUNTER — Other Ambulatory Visit: Payer: Self-pay | Admitting: Nurse Practitioner

## 2022-07-24 LAB — MICROALBUMIN / CREATININE URINE RATIO
Creatinine, Urine: 111.6 mg/dL
Microalb/Creat Ratio: 602 mg/g creat — ABNORMAL HIGH (ref 0–29)
Microalbumin, Urine: 671.3 ug/mL

## 2022-07-24 LAB — LIPID PANEL
Chol/HDL Ratio: 6 ratio — ABNORMAL HIGH (ref 0.0–4.4)
Cholesterol, Total: 223 mg/dL — ABNORMAL HIGH (ref 100–199)
HDL: 37 mg/dL — ABNORMAL LOW (ref >39)
LDL Chol Calc (NIH): 153 mg/dL — ABNORMAL HIGH (ref 0–99)
Triglycerides: 179 mg/dL — ABNORMAL HIGH (ref 0–149)
VLDL Cholesterol Cal: 33 mg/dL (ref 5–40)

## 2022-07-24 LAB — HEMOGLOBIN A1C
Est. average glucose Bld gHb Est-mCnc: 117 mg/dL
Hgb A1c MFr Bld: 5.7 % — ABNORMAL HIGH (ref 4.8–5.6)

## 2022-07-24 MED ORDER — NITROFURANTOIN MONOHYD MACRO 100 MG PO CAPS: 100.0000 mg | ORAL_CAPSULE | Freq: Two times a day (BID) | ORAL | 0 refills | Status: AC

## 2022-07-24 NOTE — Patient Outreach (Signed)
  Care Coordination   Follow Up Visit Note   07/24/2022 Name: DERONDA CHRISTIAN MRN: [id number] DOB: 1935/10/28  ZENIA GUEST is a 86 y.o. year old female who sees [name], [place] for primary care. I spoke with  [name] by phone today.  What matters to the patients health and wellness today?  Patient is experiencing urinary frequency. She will take a prescription antibiotic as directed.     Goals Addressed               This Visit's Progress     Patient Stated     I need help with transportation (pt-stated)        Care Coordination Interventions: Assessed patient understanding of cancer diagnosis and recommended treatment plan Reviewed upcoming provider appointments and treatment appointments Assessed available transportation to appointments and treatments. Has consistent/reliable transportation: Yes but son is not always available  Determined patient received the resources for transportation and will complete the application in order to get back up transportation lined up  Assessed support system. Has consistent/reliable family or other support: Yes Nutrition assessment performed, patient is receiving her Meals on Wheels and this service is going well for her Determined patient feels she is doing well overall, she continues to wear her continuous Oxygen as directed        Other     Urinary frequency        Care Coordination Interventions: Evaluation of current treatment plan related to Urinary frequency and patient's adherence to plan as established by provider Review of patient status, including review of consultant's reports, relevant laboratory and other test results Assessed for s/s suggestive of UTI due to c/o urinary frequency and elevated leukocytes with recent urinalysis Collaborated with PCP for recommendations, PCP placed order for antibiotic to treat UTI  Instructed patient regarding prescribed antibiotic including indication, dosage and frequency, patient  verbalizes understanding  Educated patient regarding the importance to drink plenty of water, to aim for 48-64 oz daily unless otherwise directed           SDOH assessments and interventions completed:  No     Care Coordination Interventions:  Yes, provided   Follow up plan: Follow up call scheduled for 08/21/22 @1030  AM    Encounter Outcome:  Pt. Visit Completed

## 2022-07-24 NOTE — Patient Instructions (Signed)
Visit Information  Thank you for taking time to visit with me today. Please don't hesitate to contact me if I can be of assistance to you.   Following are the goals we discussed today:   Goals Addressed               This Visit's Progress     Patient Stated     I need help with transportation (pt-stated)        Care Coordination Interventions: Assessed patient understanding of cancer diagnosis and recommended treatment plan Reviewed upcoming provider appointments and treatment appointments Assessed available transportation to appointments and treatments. Has consistent/reliable transportation: Yes but son is not always available  Determined patient received the resources for transportation and will complete the application in order to get back up transportation lined up  Assessed support system. Has consistent/reliable family or other support: Yes Nutrition assessment performed, patient is receiving her Meals on Wheels and this service is going well for her Determined patient feels she is doing well overall, she continues to wear her continuous Oxygen as directed        Other     Urinary frequency        Care Coordination Interventions: Evaluation of current treatment plan related to Urinary frequency and patient's adherence to plan as established by provider Review of patient status, including review of consultant's reports, relevant laboratory and other test results Assessed for s/s suggestive of UTI due to c/o urinary frequency and elevated leukocytes with recent urinalysis Collaborated with PCP for recommendations, PCP placed order for antibiotic to treat UTI  Instructed patient regarding prescribed antibiotic including indication, dosage and frequency, patient verbalizes understanding  Educated patient regarding the importance to drink plenty of water, to aim for 48-64 oz daily unless otherwise directed           Our next appointment is by telephone on 08/21/22 at 1030  AM  Please call the care guide team at [phone] if you need to cancel or reschedule your appointment.   If you are experiencing a Mental Health or [place] or need someone to talk to, please call 1-800-273-TALK (toll free, 24 hour hotline)  The patient verbalized understanding of instructions, educational materials, and care plan provided today and agreed to receive a mailed copy of patient instructions, educational materials, and care plan.   Barb Merino, RN, BSN, CCM Care Management Coordinator [facility] Care Management Direct Phone: [phone]

## 2022-07-28 ENCOUNTER — Ambulatory Visit (HOSPITAL_COMMUNITY)
Admission: RE | Admit: 2022-07-28 | Discharge: 2022-07-28 | Disposition: A | Discharge status: Home / Self Care | Payer: PPO | Source: Ambulatory Visit | Attending: Physician Assistant | Admitting: Physician Assistant

## 2022-07-28 DIAGNOSIS — J439 Emphysema, unspecified: Secondary | ICD-10-CM | POA: Diagnosis not present

## 2022-07-28 DIAGNOSIS — C3490 Malignant neoplasm of unspecified part of unspecified bronchus or lung: Secondary | ICD-10-CM | POA: Diagnosis not present

## 2022-07-28 DIAGNOSIS — J181 Lobar pneumonia, unspecified organism: Secondary | ICD-10-CM | POA: Diagnosis not present

## 2022-07-31 ENCOUNTER — Other Ambulatory Visit: Payer: Self-pay

## 2022-07-31 ENCOUNTER — Inpatient Hospital Stay: Payer: PPO

## 2022-07-31 ENCOUNTER — Inpatient Hospital Stay (HOSPITAL_BASED_OUTPATIENT_CLINIC_OR_DEPARTMENT_OTHER): Payer: PPO | Admitting: Internal Medicine

## 2022-07-31 ENCOUNTER — Inpatient Hospital Stay: Payer: PPO | Attending: Internal Medicine

## 2022-07-31 DIAGNOSIS — Z5112 Encounter for antineoplastic immunotherapy: Secondary | ICD-10-CM | POA: Insufficient documentation

## 2022-07-31 DIAGNOSIS — D649 Anemia, unspecified: Secondary | ICD-10-CM | POA: Diagnosis not present

## 2022-07-31 DIAGNOSIS — E039 Hypothyroidism, unspecified: Secondary | ICD-10-CM | POA: Insufficient documentation

## 2022-07-31 DIAGNOSIS — C3432 Malignant neoplasm of lower lobe, left bronchus or lung: Secondary | ICD-10-CM | POA: Diagnosis not present

## 2022-07-31 DIAGNOSIS — C3490 Malignant neoplasm of unspecified part of unspecified bronchus or lung: Secondary | ICD-10-CM

## 2022-07-31 LAB — CMP (CANCER CENTER ONLY)
ALT: 7 U/L (ref 0–44)
AST: 10 U/L — ABNORMAL LOW (ref 15–41)
Albumin: 3.8 g/dL (ref 3.5–5.0)
Alkaline Phosphatase: 71 U/L (ref 38–126)
Anion gap: 9 (ref 5–15)
BUN: 35 mg/dL — ABNORMAL HIGH (ref 8–23)
CO2: 26 mmol/L (ref 22–32)
Calcium: 10 mg/dL (ref 8.9–10.3)
Chloride: 100 mmol/L (ref 98–111)
Creatinine: 1.5 mg/dL — ABNORMAL HIGH (ref 0.44–1.00)
GFR, Estimated: 34 mL/min — ABNORMAL LOW (ref >60)
Glucose, Bld: 142 mg/dL — ABNORMAL HIGH (ref 70–99)
Potassium: 4.2 mmol/L (ref 3.5–5.1)
Sodium: 135 mmol/L (ref 135–145)
Total Bilirubin: 0.3 mg/dL (ref 0.3–1.2)
Total Protein: 8.1 g/dL (ref 6.5–8.1)

## 2022-07-31 LAB — CBC WITH DIFFERENTIAL (CANCER CENTER ONLY)
Abs Immature Granulocytes: 0.03 10*3/uL (ref 0.00–0.07)
Basophils Absolute: 0.1 10*3/uL (ref 0.0–0.1)
Basophils Relative: 1 %
Eosinophils Absolute: 0.3 10*3/uL (ref 0.0–0.5)
Eosinophils Relative: 4 %
HCT: 30.8 % — ABNORMAL LOW (ref 36.0–46.0)
Hemoglobin: 9.5 g/dL — ABNORMAL LOW (ref 12.0–15.0)
Immature Granulocytes: 0 %
Lymphocytes Relative: 3 %
Lymphs Abs: 0.2 10*3/uL — ABNORMAL LOW (ref 0.7–4.0)
MCH: 29.1 pg (ref 26.0–34.0)
MCHC: 30.8 g/dL (ref 30.0–36.0)
MCV: 94.2 fL (ref 80.0–100.0)
Monocytes Absolute: 0.7 10*3/uL (ref 0.1–1.0)
Monocytes Relative: 9 %
Neutro Abs: 5.9 10*3/uL (ref 1.7–7.7)
Neutrophils Relative %: 83 %
Platelet Count: 327 10*3/uL (ref 150–400)
RBC: 3.27 MIL/uL — ABNORMAL LOW (ref 3.87–5.11)
RDW: 16.8 % — ABNORMAL HIGH (ref 11.5–15.5)
WBC Count: 7.1 10*3/uL (ref 4.0–10.5)
nRBC: 0 % (ref 0.0–0.2)

## 2022-07-31 LAB — SAMPLE TO BLOOD BANK

## 2022-07-31 LAB — TSH: TSH: 0.268 u[IU]/mL — ABNORMAL LOW (ref 0.350–4.500)

## 2022-07-31 MED ORDER — SODIUM CHLORIDE 0.9 % IV SOLN
1500.0000 mg | Freq: Once | INTRAVENOUS | Status: AC
  Administered 2022-07-31: 1500 mg via INTRAVENOUS
  Filled 2022-07-31: qty 30

## 2022-07-31 MED ORDER — SODIUM CHLORIDE 0.9 % IV SOLN: Freq: Once | INTRAVENOUS | Status: AC

## 2022-07-31 NOTE — Progress Notes (Signed)
Abigail [name] [phone]   [phone]  OFFICE PROGRESS NOTE  Abigail [name], Abigail [name] [address] [address]  DIAGNOSIS: Stage IIIA (T2a, N2, M0) non-small cell lung cancer, adenocarcinoma presented with left lower lobe lung mass in addition to left hilar and mediastinal lymphadenopathy diagnosed in November 2022 with no actionable mutation on the guardant blood test   PRIOR THERAPY: Weekly concurrent chemoradiation with carboplatin for an AUC of 2 and paclitaxel 45 mg per metered squared.  First dose on 08/05/2021.  Status post 6 cycles.  Last dose was given on September 03, 2021.   CURRENT THERAPY: Consolidation treatment with immunotherapy with Imfinzi 1500 Mg IV every 4 weeks.  First dose October 23, 2021.  Status post 10 cycles.  INTERVAL HISTORY: Abigail [name] 86 y.o. female returns to the clinic today for follow-up visit.  The patient is feeling fine today with no concerning complaints except for the baseline shortness of breath and she is currently on home oxygen.  She enjoyed her Thanksgiving holiday with her daughter who came from [place].  The patient denied having any chest pain, cough or hemoptysis.  She has no nausea, vomiting, diarrhea or constipation.  She has no headache or visual changes.  She has no skin rash or itching.  She has been tolerating her consolidation treatment with immunotherapy fairly well.  She is here today for evaluation before starting cycle #11 with repeat CT scan of the chest for restaging of her disease.  MEDICAL HISTORY: Past Medical History:  Diagnosis Date   Anemia    Arthritis    knees, back   Carotid artery occlusion    Chronic kidney disease    stage 3 ckd no nephrologist   Complete uterine prolapse with prolapse of anterior vaginal wall    Complication of anesthesia    hard to wake up per pt   Constipation    Coronary artery disease    Diverticulitis yrs ago coialitis   Dyspnea     History of blood transfusion    History of radiation therapy    right lung 08/05/2021-09/18/2021  Dr Gery Pray   Hypertension    Hypothyroid    Lung cancer ([place])    Numbness    in hands at times   Pneumonia    Pre-diabetes    Scoliosis    STEMI (ST elevation myocardial infarction) ([place]) 10/26/2019   DES RCA   Wears dentures    full dentures   Wears glasses    for reading    ALLERGIES:  is allergic to codeine and norvasc [amlodipine].  MEDICATIONS:  Current Outpatient Medications  Medication Sig Dispense Refill   acetaminophen (TYLENOL) 325 MG tablet Take 2 tablets (650 mg total) by mouth every 6 (six) hours as needed for mild pain (or Fever >/= 101).     albuterol (VENTOLIN HFA) 108 (90 Base) MCG/ACT inhaler TAKE 2 PUFFS BY MOUTH EVERY 6 HOURS AS NEEDED FOR WHEEZE OR SHORTNESS OF BREATH 8.5 each 1   aspirin EC 81 MG tablet Take 81 mg by mouth at bedtime.     atorvastatin (LIPITOR) 80 MG tablet Take 1 tablet (80 mg total) by mouth daily at 6 PM. 90 tablet 3   budesonide-formoterol (SYMBICORT) 80-4.5 MCG/ACT inhaler Inhale 2 puffs into the lungs 2 (two) times daily. 3 each 1   Cholecalciferol (VITAMIN D3 PO) Take 2 capsules by mouth daily. (Patient not taking: Reported on 02/13/2022)  Cranberry 1000 MG CAPS Take 1,000 mg by mouth daily at 12 noon.     cyclobenzaprine (FLEXERIL) 10 MG tablet Take 1 tablet (10 mg total) by mouth 3 (three) times daily as needed for muscle spasms. 30 tablet 1   docusate sodium (COLACE) 100 MG capsule Take 100-200 mg by mouth See admin instructions. Take 100 mg in the morning and 200 mg at bedtime     esomeprazole (NEXIUM) 20 MG capsule Take 1 capsule (20 mg total) by mouth 2 (two) times daily before a meal. 60 capsule 3   estradiol (ESTRACE) 0.1 MG/GM vaginal cream Place 0.5 g vaginally once a week. Place 0.5g twice a week at opening of vagina     ezetimibe (ZETIA) 10 MG tablet Take 1 tablet (10 mg total) by mouth daily. 90 tablet 3   ferrous  sulfate 325 (65 FE) MG EC tablet Take 1 tablet (325 mg total) by mouth daily with breakfast. 30 tablet 2   hydrOXYzine (ATARAX) 10 MG tablet Take 1 tablet (10 mg total) by mouth 3 (three) times daily as needed. 30 tablet 0   levothyroxine (SYNTHROID) 200 MCG tablet PLEASE ALTERNATE 175 MCG AND 200 MG DOSE TABLET EVERY OTHER DAY 30 tablet 1   lisinopril-hydrochlorothiazide (ZESTORETIC) 10-12.5 MG tablet Take 1 tablet by mouth daily. 36 tablet 0   nitroGLYCERIN (NITROSTAT) 0.4 MG SL tablet Place 1 tablet (0.4 mg total) under the tongue every 5 (five) minutes x 3 doses as needed for chest pain. 25 tablet 3   OXYGEN Inhale into the lungs.     prochlorperazine (COMPAZINE) 10 MG tablet Take 1 tablet (10 mg total) by mouth every 6 (six) hours as needed for nausea or vomiting. (Patient not taking: Reported on 07/03/2022) 30 tablet 2   Trospium Chloride 60 MG CP24 Take 1 capsule (60 mg total) by mouth daily. 30 capsule 11   No current facility-administered medications for this visit.    SURGICAL HISTORY:  Past Surgical History:  Procedure Laterality Date   ANTERIOR AND POSTERIOR REPAIR WITH SACROSPINOUS FIXATION N/A 12/20/2020   Procedure: SACROSPINOUS LIGAMENT FIXATION;  Surgeon: [name], MD;  Location: [facility];  Service: Gynecology;  Laterality: N/A;  Total time requested for all procedures is 2 hours   Ada age 72   lower   bartholin cyst removal  age 10's   BLADDER SUSPENSION N/A 12/20/2020   Procedure: TRANSVAGINAL TAPE (TVT) PROCEDURE;  Surgeon: [name], MD;  Location: [facility];  Service: Gynecology;  Laterality: N/A;   BRONCHIAL BRUSHINGS  07/15/2021   Procedure: BRONCHIAL BRUSHINGS;  Surgeon: Collene Gobble, MD;  Location: [facility] ENDOSCOPY;  Service: Pulmonary;;   BRONCHIAL NEEDLE ASPIRATION BIOPSY  07/15/2021   Procedure: BRONCHIAL NEEDLE ASPIRATION BIOPSIES;  Surgeon: Collene Gobble, MD;  Location: [facility] ENDOSCOPY;   Service: Pulmonary;;   CORONARY/GRAFT ACUTE MI REVASCULARIZATION N/A 10/26/2019   Procedure: Coronary/Graft Acute MI Revascularization;  Surgeon: Sherren Mocha, MD;  Location: [facility];  Service: Cardiovascular;  Laterality: N/A;   CYSTOCELE REPAIR N/A 12/20/2020   Procedure: ANTERIOR AND POSTERIOR REPAIR WITH PERINEORRHAPHY;  Surgeon: [name], MD;  Location: [facility];  Service: Gynecology;  Laterality: N/A;   CYSTOSCOPY N/A 12/20/2020   Procedure: CYSTOSCOPY;  Surgeon: [name], MD;  Location: [facility];  Service: Gynecology;  Laterality: N/A;   ELBOW SURGERY  1990's   left   ENDOBRONCHIAL ULTRASOUND N/A 07/15/2021  Procedure: ENDOBRONCHIAL ULTRASOUND;  Surgeon: Collene Gobble, MD;  Location: [facility] ENDOSCOPY;  Service: Pulmonary;  Laterality: N/A;   HEMOSTASIS CONTROL  07/15/2021   Procedure: HEMOSTASIS CONTROL;  Surgeon: Collene Gobble, MD;  Location: [facility] ENDOSCOPY;  Service: Pulmonary;;   LEFT HEART CATH AND CORONARY ANGIOGRAPHY N/A 10/26/2019   Procedure: LEFT HEART CATH AND CORONARY ANGIOGRAPHY;  Surgeon: Sherren Mocha, MD;  Location: [facility];  Service: Cardiovascular;  Laterality: N/A;   TONSILLECTOMY      REVIEW OF SYSTEMS:  Constitutional: negative Eyes: negative Ears, nose, mouth, throat, and face: negative Respiratory: positive for dyspnea on exertion Cardiovascular: negative Gastrointestinal: negative Genitourinary:negative Integument/breast: negative Hematologic/lymphatic: negative Musculoskeletal:negative Neurological: negative Behavioral/Psych: negative Endocrine: negative Allergic/Immunologic: negative   PHYSICAL EXAMINATION: General appearance: alert, cooperative, and no distress Head: Normocephalic, without obvious abnormality, atraumatic Neck: no adenopathy, no JVD, supple, symmetrical, trachea midline, and thyroid not enlarged, symmetric, no tenderness/mass/nodules Lymph nodes:  Cervical, supraclavicular, and axillary nodes normal. Resp: clear to auscultation bilaterally Back: symmetric, no curvature. ROM normal. No CVA tenderness. Cardio: regular rate and rhythm, S1, S2 normal, no murmur, click, rub or gallop GI: soft, non-tender; bowel sounds normal; no masses,  no organomegaly Extremities: extremities normal, atraumatic, no cyanosis or edema Neurologic: Alert and oriented X 3, normal strength and tone. Normal symmetric reflexes. Normal coordination and gait  ECOG PERFORMANCE STATUS: 1 - Symptomatic but completely ambulatory  Blood pressure (!) 146/59, pulse 89, temperature 97.9 F (36.6 C), temperature source Oral, resp. rate 19, weight 150 lb 9 oz (68.3 kg), SpO2 97 %.  LABORATORY DATA: Lab Results  Component Value Date   WBC 7.1 07/31/2022   HGB 9.5 (L) 07/31/2022   HCT 30.8 (L) 07/31/2022   MCV 94.2 07/31/2022   PLT 327 07/31/2022      Chemistry      Component Value Date/Time   NA 133 (L) 07/03/2022 1357   NA 137 08/28/2021 0923   K 5.1 07/03/2022 1357   CL 101 07/03/2022 1357   CO2 25 07/03/2022 1357   BUN 33 (H) 07/03/2022 1357   BUN 20 08/28/2021 0923   CREATININE 1.38 (H) 07/03/2022 1357   CREATININE 1.08 04/18/2014 1516   GLU 104 12/23/2017 1111      Component Value Date/Time   CALCIUM 9.3 07/03/2022 1357   ALKPHOS 70 07/03/2022 1357   AST 9 (L) 07/03/2022 1357   ALT 7 07/03/2022 1357   BILITOT 0.2 (L) 07/03/2022 1357       RADIOGRAPHIC STUDIES: CT Chest Wo Contrast  Result Date: 07/28/2022 CLINICAL DATA:  Non-small cell lung cancer; * Tracking Code: BO * EXAM: CT CHEST WITHOUT CONTRAST TECHNIQUE: Multidetector CT imaging of the chest was performed following the standard protocol without IV contrast. RADIATION DOSE REDUCTION: This exam was performed according to the departmental dose-optimization program which includes automated exposure control, adjustment of the mA and/or kV according to patient size and/or use of iterative  reconstruction technique. COMPARISON:  Multiple priors, most recent chest CT dated May 05, 2022 FINDINGS: Cardiovascular: Normal heart size. Trace pericardial effusion normal caliber thoracic aorta with severe atherosclerotic disease. Severe coronary artery calcifications. Mediastinum/Nodes: Moderate hiatal hernia. Thyroid is unremarkable. No pathologically enlarged lymph nodes seen in the chest. Lungs/Pleura: Central airways are patent. Moderate centrilobular emphysema. Left paramediastinal postradiation change appears more linear and confluent when compared with prior exam. Solid nodule of the left lower lobe measures 1.3 x 1.5 cm on series 5, image 42, unchanged when compared with prior exam,  although there is new linear consolidation anteriorly on series 5, image 40. Upper Abdomen: Left staghorn calculus. Unchanged simple appearing left renal cyst for which no follow-up imaging is recommended. Additional unchanged hyperdense renal lesions which are likely hemorrhagic or proteinaceous, attention on follow-up imaging is recommended. Musculoskeletal: No chest wall mass or suspicious bone lesions identified. IMPRESSION: 1. New linear consolidation seen anterior to treated left lower lobe nodule, likely evolving postradiation change, although recurrent disease not entirely excluded. Recommend short-term follow-up chest CT in 3 months or PET-CT for further evaluation. 2. Aortic Atherosclerosis (ICD10-I70.0) and Emphysema (ICD10-J43.9). Electronically Signed   By: Yetta Glassman M.D.   On: 07/28/2022 21:25    ASSESSMENT AND PLAN: This is a very pleasant 86 years old white female with stage IIIA (T2a, N2, M0) non-small cell lung cancer, adenocarcinoma presented with left lower lobe lung mass in addition to left hilar and mediastinal lymphadenopathy diagnosed in November 2022 with no actionable mutations The patient is currently undergoing a course of concurrent chemotherapy with radiation.  Her chemotherapy  is in the form of carboplatin for AUC of 2 and paclitaxel 45 Mg/M2 status post 6 cycles.  She has been tolerating her treatment with concurrent chemoradiation fairly well except for the radiation-induced odynophagia and dysphagia. Her scan showed significant improvement in her disease with 65% reduction in the volume of the left lower lobe nodule.  She continues to have persistent left hilar and infrahilar adenopathy. The patient is currently undergoing treatment with consolidation immunotherapy with Imfinzi 1500 Mg IV every 4 weeks.  Status post 11 cycles. The patient has been tolerating this treatment well with no concerning adverse effects. She had repeat CT scan of the chest performed recently.  I personally and independently reviewed the scan images and discussed the result with the patient today.  Her scan showed no concerning findings for disease progression. I recommended for her to continue her current treatment with Imfinzi and she will proceed with cycle #12 today as planned. For the anemia we will continue to monitor her hemoglobin and hematocrit closely and consider her for transfusion if needed. The patient will come back for follow-up visit in 4 weeks for evaluation before the next cycle of her treatment. She was advised to call immediately if she has any other concerning symptoms in the interval.  The patient voices understanding of current disease status and treatment options and is in agreement with the current care plan. The total time spent in the appointment was 30 minutes.  All questions were answered. The patient knows to call the clinic with any problems, questions or concerns. We can certainly see the patient much sooner if necessary.  Disclaimer: This note was dictated with voice recognition software. Similar sounding words can inadvertently be transcribed and may not be corrected upon review.

## 2022-07-31 NOTE — Patient Instructions (Signed)
[facility]  Discharge Instructions: Thank you for choosing [facility] to provide your oncology and hematology care.   If you have a lab appointment with the Cancer Center, please go directly to the Cancer Center and check in at the registration area.   Wear comfortable clothing and clothing appropriate for easy access to any Portacath or PICC line.   We strive to give you quality time with your provider. You may need to reschedule your appointment if you arrive late (15 or more minutes).  Arriving late affects you and other patients whose appointments are after yours.  Also, if you miss three or more appointments without notifying the office, you may be dismissed from the clinic at the provider's discretion.      For prescription refill requests, have your pharmacy contact our office and allow 72 hours for refills to be completed.    Today you received the following chemotherapy and/or immunotherapy agents; Imfinzi      To help prevent nausea and vomiting after your treatment, we encourage you to take your nausea medication as directed.  BELOW ARE SYMPTOMS THAT SHOULD BE REPORTED IMMEDIATELY: *FEVER GREATER THAN 100.4 F (38 C) OR HIGHER *CHILLS OR SWEATING *NAUSEA AND VOMITING THAT IS NOT CONTROLLED WITH YOUR NAUSEA MEDICATION *UNUSUAL SHORTNESS OF BREATH *UNUSUAL BRUISING OR BLEEDING *URINARY PROBLEMS (pain or burning when urinating, or frequent urination) *BOWEL PROBLEMS (unusual diarrhea, constipation, pain near the anus) TENDERNESS IN MOUTH AND THROAT WITH OR WITHOUT PRESENCE OF ULCERS (sore throat, sores in mouth, or a toothache) UNUSUAL RASH, SWELLING OR PAIN  UNUSUAL VAGINAL DISCHARGE OR ITCHING   Items with * indicate a potential emergency and should be followed up as soon as possible or go to the Emergency Department if any problems should occur.  Please show the CHEMOTHERAPY ALERT CARD or IMMUNOTHERAPY ALERT CARD at check-in to  the Emergency Department and triage nurse.  Should you have questions after your visit or need to cancel or reschedule your appointment, please contact [facility]  Dept: [phone]  and follow the prompts.  Office hours are 8:00 a.m. to 4:30 p.m. Monday - Friday. Please note that voicemails left after 4:00 p.m. may not be returned until the following business day.  We are closed weekends and major holidays. You have access to a nurse at all times for urgent questions. Please call the main number to the clinic Dept: [phone] and follow the prompts.   For any non-urgent questions, you may also contact your provider using MyChart. We now offer e-Visits for anyone 18 and older to request care online for non-urgent symptoms. For details visit [email or web address].   Also download the MyChart app! Go to the app store, search "MyChart", open the app, select [facility], and log in with your MyChart username and password.  Masks are optional in the cancer centers. If you would like for your care team to wear a mask while they are taking care of you, please let them know. You may have one support person who is at least 84 years old accompany you for your appointments. 

## 2022-08-04 NOTE — Progress Notes (Signed)
Cardiology Office Note   Date:  08/05/2022   ID:  Abigail [name], DOB 01/08/1936, MRN [id number]  PCP:  [name], FNP    No chief complaint on file.  CAD/old MI  Wt Readings from Last 3 Encounters:  08/05/22 145 lb (65.8 kg)  07/31/22 150 lb 9 oz (68.3 kg)  07/22/22 152 lb (68.9 kg)       History of Present Illness: Abigail [name] is a 86 y.o. female  who has had presyncope, hyponatremia in the past.  I saw her in 10/2018 for these issues.    In March 2021, she presented [name] an ST elevation MI.   Cath report showed: "A drug-eluting stent was successfully placed using a SYNERGY XD 2.75X24. Post intervention, there is a 0% residual stenosis.   1.  Severe single-vessel coronary artery disease [name] a proximal RCA culprit lesion, treated successfully [name] primary PCI using a 2.75 x 24 mm Synergy DES 2.  Mild diffuse nonobstructive disease involving the LAD and left circumflex without any significant stenoses 3.  Normal LVEDP 4.  Moderately severe diffuse residual stenosis in the mid and distal RCA, favor initial medical therapy."   Echo 2021: "Left ventricular ejection fraction, by estimation, is 60 to 65%. The  left ventricle has normal function. The left ventricle has no regional  wall motion abnormalities. Left ventricular diastolic parameters were  normal.   2. Right ventricular systolic function is normal. The right ventricular  size is normal.   3. Left atrial size was mildly dilated.   4. The mitral valve is normal in structure and function. No evidence of  mitral valve regurgitation. No evidence of mitral stenosis.   5. The aortic valve is normal in structure and function. Aortic valve  regurgitation is not visualized. No aortic stenosis is present. "   Walking was limited due to GYN issues.     She had angina in 10/2020 and cath was planned but she was found to be severely anemic and required urgent GYN surgery.  Has done well  Abigail [name]  weight restriction of 10 lbs.   Got chemo in late 2022.    She has some unsteadiness.  Using walker. As of June 2023: "Hbg has been low. May be getting more immunotherapy. She had a blood transfusion several months. No GYN bleeding. She has not been taking her iron as prescribed. "  Now on O2 3L [place] continuous.  Has a persistent cough.  Has a few more immunotherapy. Already took radiation.  No further bleeding.    Denies : Chest pain. Dizziness. Leg edema. Nitroglycerin use. Orthopnea. Palpitations. Paroxysmal nocturnal dyspnea. Shortness of breath. Syncope.     Past Medical History:  Diagnosis Date   Anemia    Arthritis    knees, back   Carotid artery occlusion    Chronic kidney disease    stage 3 ckd no nephrologist   Complete uterine prolapse [name] prolapse of anterior vaginal wall    Complication of anesthesia    hard to wake up per pt   Constipation    Coronary artery disease    Diverticulitis yrs ago coialitis   Dyspnea    History of blood transfusion    History of radiation therapy    right lung 08/05/2021-09/18/2021  Dr Gery Pray   Hypertension    Hypothyroid    Lung cancer ([place])    Numbness    in hands at times   Pneumonia  Pre-diabetes    Scoliosis    STEMI (ST elevation myocardial infarction) ([place]) 10/26/2019   DES RCA   Wears dentures    full dentures   Wears glasses    for reading    Past Surgical History:  Procedure Laterality Date   ANTERIOR AND POSTERIOR REPAIR [name] SACROSPINOUS FIXATION N/A 12/20/2020   Procedure: SACROSPINOUS LIGAMENT FIXATION;  Surgeon: [name], MD;  Location: [facility];  Service: Gynecology;  Laterality: N/A;  Total time requested for all procedures is 2 hours   Abigail [name] age 62   lower   bartholin cyst removal  age 56's   BLADDER SUSPENSION N/A 12/20/2020   Procedure: TRANSVAGINAL TAPE (TVT) PROCEDURE;  Surgeon: [name], MD;  [facility];   Service: Gynecology;  Laterality: N/A;   BRONCHIAL BRUSHINGS  07/15/2021   Procedure: BRONCHIAL BRUSHINGS;  Surgeon: Collene Gobble, MD;  Location: [facility] ENDOSCOPY;  Service: Pulmonary;;   BRONCHIAL NEEDLE ASPIRATION BIOPSY  07/15/2021   Procedure: BRONCHIAL NEEDLE ASPIRATION BIOPSIES;  Surgeon: Collene Gobble, MD;  Location: [facility] ENDOSCOPY;  Service: Pulmonary;;   CORONARY/GRAFT ACUTE MI REVASCULARIZATION N/A 10/26/2019   Procedure: Coronary/Graft Acute MI Revascularization;  Surgeon: Sherren Mocha, MD;  Location: [place] [facility];  Service: Cardiovascular;  Laterality: N/A;   CYSTOCELE REPAIR N/A 12/20/2020   Procedure: ANTERIOR AND POSTERIOR REPAIR [name] PERINEORRHAPHY;  Surgeon: [name], MD;  Location: [facility];  Service: Gynecology;  Laterality: N/A;   CYSTOSCOPY N/A 12/20/2020   Procedure: CYSTOSCOPY;  Surgeon: [name], MD;  Location: [facility];  Service: Gynecology;  Laterality: N/A;   ELBOW SURGERY  1990's   left   ENDOBRONCHIAL ULTRASOUND N/A 07/15/2021   Procedure: ENDOBRONCHIAL ULTRASOUND;  Surgeon: Collene Gobble, MD;  Location: [facility] ENDOSCOPY;  Service: Pulmonary;  Laterality: N/A;   HEMOSTASIS CONTROL  07/15/2021   Procedure: HEMOSTASIS CONTROL;  Surgeon: Collene Gobble, MD;  Location: [facility] ENDOSCOPY;  Service: Pulmonary;;   LEFT HEART CATH AND CORONARY ANGIOGRAPHY N/A 10/26/2019   Procedure: LEFT HEART CATH AND CORONARY ANGIOGRAPHY;  Surgeon: Sherren Mocha, MD;  Location: [facility];  Service: Cardiovascular;  Laterality: N/A;   TONSILLECTOMY       Current Outpatient Medications  Medication Sig Dispense Refill   acetaminophen (TYLENOL) 325 MG tablet Take 2 tablets (650 mg total) by mouth every 6 (six) hours as needed for mild pain (or Fever >/= 101).     albuterol (VENTOLIN HFA) 108 (90 Base) MCG/ACT inhaler TAKE 2 PUFFS BY MOUTH EVERY 6 HOURS AS NEEDED FOR WHEEZE OR SHORTNESS OF BREATH 8.5 each 1    aspirin EC 81 MG tablet Take 81 mg by mouth at bedtime.     atorvastatin (LIPITOR) 80 MG tablet Take 1 tablet (80 mg total) by mouth daily at 6 PM. 90 tablet 3   budesonide-formoterol (SYMBICORT) 80-4.5 MCG/ACT inhaler Inhale 2 puffs into the lungs 2 (two) times daily. 3 each 1   Cranberry 1000 MG CAPS Take 1,000 mg by mouth daily at 12 noon.     cyclobenzaprine (FLEXERIL) 10 MG tablet Take 1 tablet (10 mg total) by mouth 3 (three) times daily as needed for muscle spasms. 30 tablet 1   docusate sodium (COLACE) 100 MG capsule Take 100-200 mg by mouth See admin instructions. Take 100 mg in the morning and 200 mg at bedtime     estradiol (ESTRACE) 0.1 MG/GM vaginal cream Place  0.5 g vaginally once a week. Place 0.5g twice a week at opening of vagina     ezetimibe (ZETIA) 10 MG tablet Take 1 tablet (10 mg total) by mouth daily. 90 tablet 3   ferrous sulfate 325 (65 FE) MG EC tablet Take 1 tablet (325 mg total) by mouth daily [name] breakfast. 30 tablet 2   hydrOXYzine (ATARAX) 10 MG tablet Take 1 tablet (10 mg total) by mouth 3 (three) times daily as needed. 30 tablet 0   levothyroxine (SYNTHROID) 200 MCG tablet PLEASE ALTERNATE 175 MCG AND 200 MG DOSE TABLET EVERY OTHER DAY 30 tablet 1   lisinopril-hydrochlorothiazide (ZESTORETIC) 10-12.5 MG tablet Take 1 tablet by mouth daily. 36 tablet 0   nitroGLYCERIN (NITROSTAT) 0.4 MG SL tablet Place 1 tablet (0.4 mg total) under the tongue every 5 (five) minutes x 3 doses as needed for chest pain. 25 tablet 3   OXYGEN Inhale into the lungs.     prochlorperazine (COMPAZINE) 10 MG tablet Take 1 tablet (10 mg total) by mouth every 6 (six) hours as needed for nausea or vomiting. 30 tablet 2   Trospium Chloride 60 MG CP24 Take 1 capsule (60 mg total) by mouth daily. 30 capsule 11   Cholecalciferol (VITAMIN D3 PO) Take 2 capsules by mouth daily. (Patient not taking: Reported on 08/05/2022)     esomeprazole (NEXIUM) 20 MG capsule Take 1 capsule (20 mg total) by mouth 2  (two) times daily before a meal. (Patient not taking: Reported on 07/31/2022) 60 capsule 3   No current facility-administered medications for this visit.    Allergies:   Codeine and Norvasc [amlodipine]    Social History:  The patient  reports that she quit smoking about 36 years ago. Her smoking use included cigarettes. She has a 52.50 pack-year smoking history. She has never used smokeless tobacco. She reports that she does not drink alcohol and does not use drugs.   Family History:  The patient's family history includes Hypertension in her mother.    ROS:  Please see the history of present illness.   Otherwise, review of systems are positive for cough.   All other systems are reviewed and negative.    PHYSICAL EXAM: VS:  BP (!) 112/54   Pulse 91   Ht 5\' 6"  (1.676 m)   Wt 145 lb (65.8 kg)   SpO2 94%   BMI 23.40 kg/m  , BMI Body mass index is 23.4 kg/m. GEN: Well nourished, well developed, in no acute distress HEENT: normal Neck: no JVD, carotid bruits, or masses Cardiac: RRR; no murmurs, rubs, or gallops,no edema  Respiratory:  clear to auscultation bilaterally, normal work of breathing GI: soft, nontender, nondistended, + BS MS: no deformity or atrophy Skin: warm and dry, no rash Neuro:  Strength and sensation are intact Psych: euthymic mood, full affect   EKG:   The ekg ordered 6/23 demonstrates NSR, RBBB   Recent Labs: 07/31/2022: ALT 7; BUN 35; Creatinine 1.50; Hemoglobin 9.5; Platelet Count 327; Potassium 4.2; Sodium 135; TSH 0.268   Lipid Panel    Component Value Date/Time   CHOL 223 (H) 07/22/2022 0942   TRIG 179 (H) 07/22/2022 0942   HDL 37 (L) 07/22/2022 0942   CHOLHDL 6.0 (H) 07/22/2022 0942   CHOLHDL 4.8 10/26/2019 2246   VLDL 40 10/26/2019 2246   LDLCALC 153 (H) 07/22/2022 8101     Other studies Reviewed: Additional studies/ records that were reviewed today [name] results demonstrating: labs reviewed.   ASSESSMENT  AND PLAN:  CAD/old MI: on  aspirin.  No obvious bleeding issues.  Hbg 9.5 a week ago.  Has received IV iron in the past.  Maintaining Hbg will help prevent angina.  Normal LVEF by May 2023 echocardiogram. Mild carotid disease is in 2019 by Doppler. Hypertension: The current medical regimen is effective;  continue present plan and medications. Hyperlipidemia: LDL 153. Elevated lipids.  She has forgotten her lipitor and zetia due to timing.  OK to take lipitor in the AM to help [name] compliance.  Lung cancer: Managed by Dr. Earlie Server.  Hemoglobin/iron deficiency anemia followed by oncology.   Current medicines are reviewed at length [name] the patient today.  The patient concerns regarding her medicines were addressed.  The following changes have been made:  No change  Labs/ tests ordered today include:  No orders of the defined types were placed in this encounter.   Recommend 150 minutes/week of aerobic exercise Low fat, low carb, high fiber diet recommended  Disposition:   FU in 1 year   Signed, Larae Grooms, MD  08/05/2022 11:25 AM    [facility] [place], [place], [place]  [address] Phone: [phone]; Fax: [phone]

## 2022-08-05 ENCOUNTER — Encounter: Payer: Self-pay | Admitting: Interventional Cardiology

## 2022-08-05 ENCOUNTER — Ambulatory Visit: Payer: PPO | Attending: Interventional Cardiology | Admitting: Interventional Cardiology

## 2022-08-05 VITALS — BP 112/54 | HR 91 | Ht 66.0 in | Wt 145.0 lb

## 2022-08-05 DIAGNOSIS — D509 Iron deficiency anemia, unspecified: Secondary | ICD-10-CM

## 2022-08-05 DIAGNOSIS — E782 Mixed hyperlipidemia: Secondary | ICD-10-CM | POA: Diagnosis not present

## 2022-08-05 DIAGNOSIS — I25118 Atherosclerotic heart disease of native coronary artery with other forms of angina pectoris: Secondary | ICD-10-CM | POA: Diagnosis not present

## 2022-08-05 DIAGNOSIS — I252 Old myocardial infarction: Secondary | ICD-10-CM

## 2022-08-05 DIAGNOSIS — I10 Essential (primary) hypertension: Secondary | ICD-10-CM | POA: Diagnosis not present

## 2022-08-05 NOTE — Patient Instructions (Signed)
Medication Instructions:  Your physician recommends that you continue on your current medications as directed. Please refer to the Current Medication list given to you today.  *If you need a refill on your cardiac medications before your next appointment, please call your pharmacy*   Lab Work: none If you have labs (blood work) drawn today and your tests are completely normal, you will receive your results only by: MyChart Message (if you have MyChart) OR A paper copy in the mail If you have any lab test that is abnormal or we need to change your treatment, we will call you to review the results.   Testing/Procedures: none   Follow-Up: At [facility], you and your health needs are our priority.  As part of our continuing mission to provide you with exceptional heart care, we have created designated Provider Care Teams.  These Care Teams include your primary Cardiologist (physician) and Advanced Practice Providers (APPs -  Physician Assistants and Nurse Practitioners) who all work together to provide you with the care you need, when you need it.  We recommend signing up for the patient portal called "MyChart".  Sign up information is provided on this After Visit Summary.  MyChart is used to connect with patients for Virtual Visits (Telemedicine).  Patients are able to view lab/test results, encounter notes, upcoming appointments, etc.  Non-urgent messages can be sent to your provider as well.   To learn more about what you can do with MyChart, go to [email or web address].    Your next appointment:   12 month(s)  The format for your next appointment:   In Person  Provider:   Jayadeep Varanasi, MD     Other Instructions    Important Information About Sugar       

## 2022-08-19 DIAGNOSIS — C3490 Malignant neoplasm of unspecified part of unspecified bronchus or lung: Secondary | ICD-10-CM | POA: Diagnosis not present

## 2022-08-22 ENCOUNTER — Telehealth: Payer: Self-pay | Admitting: Internal Medicine

## 2022-08-22 NOTE — Telephone Encounter (Signed)
Called patient regarding January appointment, patient has been called and voicemail was left.

## 2022-08-26 ENCOUNTER — Other Ambulatory Visit: Payer: Self-pay | Admitting: Nurse Practitioner

## 2022-08-26 DIAGNOSIS — I10 Essential (primary) hypertension: Secondary | ICD-10-CM

## 2022-08-26 NOTE — Progress Notes (Unsigned)
[facility] OFF[facility] PROGRESS NOTE  [name], [address] [place] [address]  DIAGNOSIS: Stage IIIA (T2a, N2, M0) non-small cell lung cancer, adenocarcinoma presented with left lower lobe lung mass in addition to left hilar and mediastinal lymphadenopathy diagnosed in November 2022 with no actionable mutation on the guardant blood test    PRIOR THERAPY: Weekly concurrent chemoradiation with carboplatin for an AUC of 2 and paclitaxel 45 mg per metered squared.  First dose on 08/05/2021.  Status post 6 cycles.  Last dose was given on September 03, 2021.    CURRENT THERAPY: Consolidation treatment with immunotherapy with Imfinzi 1500 Mg IV every 4 weeks.  First dose October 23, 2021.  Status post 11 cycles.   INTERVAL HISTORY: CHERMAINE SCHNYDER 87 y.o. female returns to the clinic today for a follow-up visit.   The patient is currently undergoing consolidation immunotherapy with Imfinzi. She is status post 11 cycles. She tolerates it well without any concerning adverse side effects. Overall, she is feeling well. She mentions she saw her PCP for a lesion on her back. She was referred to dermatology and has an appointment in April. [place] and has a history of a lot of sun exposure and has multiple skin lesions. She denies any fever, chills, or night sweats. She gets meals on wheels and drinks ensure every day. Her weight is stable. She reports stable dyspnea on exertion.  She is on 3 liters of supplemental oxygen via nasal cannula.  She sometimes has a cough which produces clear phlegm which is also unchanged. She has been using Delsym 12 hours if needed and uses lozenges. Denies any hemoptysis.  Denies any nausea or vomiting except she had an episode of nausea/vomiting on Christmas eve. She is not sure why she experienced this. Langley Gauss other associated symptoms of unusual food consumption out of the ordinary.  She denies unusual constipation. The patient does have  anemia for which she had stool cards performed which were positive for blood.  She was evaluated by GI who recommends close monitoring and supportive care with IV iron.  She received IV iron most recently on 05/22/22.   She takes iron supplements. She saw cardiology recently who mentioned helping anemia will help with her angina symptoms. She had questions today about what Zetia was prescribed for. She needs a refill of her synthroid. She takes 200 mcg on Saturday-Sunday and takes 175 mcg Mon-Friday.  She is here today for evaluation and repeat blood work before undergoing cycle #12.   MEDICAL HISTORY: Past Medical History:  Diagnosis Date   Anemia    Arthritis    knees, back   Carotid artery occlusion    Chronic kidney disease    stage 3 ckd no nephrologist   Complete uterine prolapse with prolapse of anterior vaginal wall    Complication of anesthesia    hard to wake up per pt   Constipation    Coronary artery disease    Diverticulitis yrs ago coialitis   Dyspnea    History of blood transfusion    History of radiation therapy    right lung 08/05/2021-09/18/2021  Dr Gery Pray   Hypertension    Hypothyroid    Lung cancer ([place])    Numbness    in hands at times   Pneumonia    Pre-diabetes    Scoliosis    STEMI (ST elevation myocardial infarction) ([place]) 10/26/2019   DES RCA  Wears dentures    full dentures   Wears glasses    for reading    ALLERGIES:  is allergic to codeine and norvasc [amlodipine].  MEDICATIONS:  Current Outpatient Medications  Medication Sig Dispense Refill   acetaminophen (TYLENOL) 325 MG tablet Take 2 tablets (650 mg total) by mouth every 6 (six) hours as needed for mild pain (or Fever >/= 101).     albuterol (VENTOLIN HFA) 108 (90 Base) MCG/ACT inhaler TAKE 2 PUFFS BY MOUTH EVERY 6 HOURS AS NEEDED FOR WHEEZE OR SHORTNESS OF BREATH 8.5 each 1   aspirin EC 81 MG tablet Take 81 mg by mouth at bedtime.     atorvastatin (LIPITOR) 80 MG tablet Take 1  tablet (80 mg total) by mouth daily at 6 PM. 90 tablet 3   budesonide-formoterol (SYMBICORT) 80-4.5 MCG/ACT inhaler Inhale 2 puffs into the lungs 2 (two) times daily. 3 each 1   Cholecalciferol (VITAMIN D3 PO) Take 2 capsules by mouth daily. (Patient not taking: Reported on 08/05/2022)     Cranberry 1000 MG CAPS Take 1,000 mg by mouth daily at 12 noon.     cyclobenzaprine (FLEXERIL) 10 MG tablet Take 1 tablet (10 mg total) by mouth 3 (three) times daily as needed for muscle spasms. 30 tablet 1   docusate sodium (COLACE) 100 MG capsule Take 100-200 mg by mouth See admin instructions. Take 100 mg in the morning and 200 mg at bedtime     esomeprazole (NEXIUM) 20 MG capsule Take 1 capsule (20 mg total) by mouth 2 (two) times daily before a meal. (Patient not taking: Reported on 07/31/2022) 60 capsule 3   estradiol (ESTRACE) 0.1 MG/GM vaginal cream Place 0.5 g vaginally once a week. Place 0.5g twice a week at opening of vagina     ezetimibe (ZETIA) 10 MG tablet Take 1 tablet (10 mg total) by mouth daily. 90 tablet 3   ferrous sulfate 325 (65 FE) MG EC tablet Take 1 tablet (325 mg total) by mouth daily with breakfast. 30 tablet 2   hydrOXYzine (ATARAX) 10 MG tablet Take 1 tablet (10 mg total) by mouth 3 (three) times daily as needed. 30 tablet 0   levothyroxine (SYNTHROID) 200 MCG tablet PLEASE ALTERNATE 175 MCG AND 200 MG DOSE TABLET EVERY OTHER DAY 30 tablet 1   lisinopril-hydrochlorothiazide (ZESTORETIC) 10-12.5 MG tablet TAKE 1 TABLET BY MOUTH EVERY DAY 36 tablet 0   nitroGLYCERIN (NITROSTAT) 0.4 MG SL tablet Place 1 tablet (0.4 mg total) under the tongue every 5 (five) minutes x 3 doses as needed for chest pain. 25 tablet 3   OXYGEN Inhale into the lungs.     prochlorperazine (COMPAZINE) 10 MG tablet Take 1 tablet (10 mg total) by mouth every 6 (six) hours as needed for nausea or vomiting. 30 tablet 2   Trospium Chloride 60 MG CP24 Take 1 capsule (60 mg total) by mouth daily. 30 capsule 11   No  current facility-administered medications for this visit.    SURGICAL HISTORY:  Past Surgical History:  Procedure Laterality Date   ANTERIOR AND POSTERIOR REPAIR WITH SACROSPINOUS FIXATION N/A 12/20/2020   Procedure: SACROSPINOUS LIGAMENT FIXATION;  Surgeon: [name], MD;  Location: [facility];  Service: Gynecology;  Laterality: N/A;  Total time requested for all procedures is 2 hours   BACK SURGERY  1980 age 72   lower   bartholin cyst removal  age 36's   BLADDER SUSPENSION N/A 12/20/2020   Procedure: TRANSVAGINAL TAPE (TVT)  PROCEDURE;  Surgeon: [name], MD;  Location: [facility];  Service: Gynecology;  Laterality: N/A;   BRONCHIAL BRUSHINGS  07/15/2021   Procedure: BRONCHIAL BRUSHINGS;  Surgeon: Collene Gobble, MD;  Location: [facility];  Service: Pulmonary;;   BRONCHIAL NEEDLE ASPIRATION BIOPSY  07/15/2021   Procedure: BRONCHIAL NEEDLE ASPIRATION BIOPSIES;  Surgeon: Collene Gobble, MD;  Location: [facility];  Service: Pulmonary;;   CORONARY/GRAFT ACUTE MI REVASCULARIZATION N/A 10/26/2019   Procedure: Coronary/Graft Acute MI Revascularization;  Surgeon: Sherren Mocha, MD;  Location: [facility];  Service: Cardiovascular;  Laterality: N/A;   CYSTOCELE REPAIR N/A 12/20/2020   Procedure: ANTERIOR AND POSTERIOR REPAIR WITH PERINEORRHAPHY;  Surgeon: [name], MD;  Location: [facility];  Service: Gynecology;  Laterality: N/A;   CYSTOSCOPY N/A 12/20/2020   Procedure: CYSTOSCOPY;  Surgeon: [name], MD;  Location: [facility];  Service: Gynecology;  Laterality: N/A;   ELBOW SURGERY  1990's   left   ENDOBRONCHIAL ULTRASOUND N/A 07/15/2021   Procedure: ENDOBRONCHIAL ULTRASOUND;  Surgeon: Collene Gobble, MD;  Location: [facility] ENDOSCOPY;  Service: Pulmonary;  Laterality: N/A;   HEMOSTASIS CONTROL  07/15/2021   Procedure: HEMOSTASIS CONTROL;  Surgeon: Collene Gobble, MD;   Location: [facility] ENDOSCOPY;  Service: Pulmonary;;   LEFT HEART CATH AND CORONARY ANGIOGRAPHY N/A 10/26/2019   Procedure: LEFT HEART CATH AND CORONARY ANGIOGRAPHY;  Surgeon: Sherren Mocha, MD;  Location: [facility];  Service: Cardiovascular;  Laterality: N/A;   TONSILLECTOMY      REVIEW OF SYSTEMS:   Constitutional: Negative for appetite change, chills, fatigue, fever and unexpected weight change.  HENT: Negative for mouth sores, nosebleeds, sore throat and trouble swallowing.   Eyes: Negative for eye problems and icterus.  Respiratory: Positive for stable dyspnea on exertion and mild intermittent cough. Negative for  hemoptysis and wheezing.   Cardiovascular: Negative for chest pain and leg swelling.  Gastrointestinal: Negative for abdominal pain, diarrhea, nausea and vomiting (none at this time), and constipation.  Genitourinary: Negative for bladder incontinence, difficulty urinating, dysuria, frequency and hematuria.   Musculoskeletal: Negative for back pain, gait problem, neck pain and neck stiffness.  Skin: Positive for several scattered skin lesions.  Neurological: Negative for dizziness, extremity weakness, gait problem, headaches, light-headedness and seizures.  Hematological: Negative for adenopathy. Does not bruise/bleed easily.  Psychiatric/Behavioral: Negative for confusion, depression and sleep disturbance. The patient is not nervous/anxious   PHYSICAL EXAMINATION:  Blood pressure 128/60, pulse 86, temperature (!) 97.5 F (36.4 C), temperature source Oral, resp. rate 18, weight 145 lb 12.8 oz (66.1 kg), SpO2 100 %.  ECOG PERFORMANCE STATUS: 2  Physical Exam  Constitutional: Oriented to person, place, and time and well-developed, well-nourished, and in no distress.  HENT:  Head: Normocephalic and atraumatic.  Mouth/Throat: Oropharynx is clear and moist. No oropharyngeal exudate.  Eyes: Conjunctivae are normal. Right eye exhibits no discharge. Left eye exhibits no  discharge. No scleral icterus.  Neck: Normal range of motion. Neck supple.  Cardiovascular: Normal rate, regular rhythm, normal heart sounds and intact distal pulses.   Pulmonary/Chest: Effort normal and breath sounds normal. No respiratory distress. No rales.  On supplemental oxygen via nasal cannula. Abdominal: Soft. Bowel sounds are normal. Exhibits no distension and no mass. There is no tenderness.  Musculoskeletal: Normal range of motion. Exhibits no edema.  Lymphadenopathy: No cervical adenopathy.  Neurological: Alert and oriented to person, place, and time. Examined in the wheelchair. Exhibits muscle wasting.  Skin: Skin is warm and dry. Several scattered dry plaque like appearing skin lesions on back. Not diaphoretic. No erythema. No pallor.  Psychiatric: Mood, memory and judgment normal.  Vitals reviewed.  LABORATORY DATA: Lab Results  Component Value Date   WBC 6.8 08/28/2022   HGB 10.0 (L) 08/28/2022   HCT 32.2 (L) 08/28/2022   MCV 92.0 08/28/2022   PLT 319 08/28/2022      Chemistry      Component Value Date/Time   NA 135 08/28/2022 0900   NA 137 08/28/2021 0923   K 4.3 08/28/2022 0900   CL 102 08/28/2022 0900   CO2 25 08/28/2022 0900   BUN 37 (H) 08/28/2022 0900   BUN 20 08/28/2021 0923   CREATININE 1.57 (H) 08/28/2022 0900   CREATININE 1.08 04/18/2014 1516   GLU 104 12/23/2017 1111      Component Value Date/Time   CALCIUM 9.6 08/28/2022 0900   ALKPHOS 72 08/28/2022 0900   AST 11 (L) 08/28/2022 0900   ALT 9 08/28/2022 0900   BILITOT 0.2 (L) 08/28/2022 0900       RADIOGRAPHIC STUDIES:  No results found.   ASSESSMENT/PLAN:  This is a very pleasant 87 year old Caucasian female diagnosed with stage IIIa (T2a, N2, M0) non-small cell lung cancer, adenocarcinoma.  The patient presented with a left lower lobe lung mass in addition to left hilar mediastinal lymphadenopathy.  She was diagnosed in November 2022.  The patient did not have any actionable mutations  by guardant 360 blood test.    The patient completed a course of concurrent chemotherapy with radiation.  Her chemotherapy is in the form of carboplatin for AUC of 2 and paclitaxel 45 Mg/M2 status post 6 cycles.  She had been tolerating her treatment with concurrent chemoradiation fairly well except for the radiation-induced odynophagia and dysphagia which has since resolved.    Her initial scan showed significant improvement in her disease with 65% reduction in the volume of the left lower lobe nodule.  She continued to have persistent left hilar and infrahilar adenopathy.   The patient is currently undergoing treatment with consolidation immunotherapy with Imfinzi 1500 Mg IV every 4 weeks.  Status post 11 cycles.    Labs were reviewed. She has some baseline CKD. Ok to treat with creatinine 1.57 today. Recommend that she proceed with cycle #12 today scheduled.   We will see her back for follow-up visit in 4 weeks for evaluation and repeat blood work before starting cycle #13 which will be her last cycle of treatment.  The patient has anemia.  She was seen by GI who recommends monitoring and supportive care for now.  Her most recent iron infusion was on 05/22/2022. She will continue her iron. I will recheck her iron levels at her next lab visit and arrange IV iron if needed.   She will continue to use supplemental oxygen.   Patient has several plaque-like scattered skin lesions on her back.  She [place] farm and has history of sunburns/sun exposure.  The lesion of her concern that she pointed to on her back looks most consistent to me for a seborrhoic keratosis, which is generally benign.  However, she does have several other skin lesions on her back.  Given her history of sun exposure I do think it would be good for her to establish with a dermatologist for routine skin checks on the rest of her body.  She has an appointment in April.  I urged her to keep  this appointment with dermatology as  scheduled which often has long appointment waiting times.  The patient takes 175 mcg of Synthroid on Monday through Fridays.  She takes 200 mcg on Sundays and Mondays.  She needs a refill of her 175 mcg.  I have sent this to her pharmacy.  Her thyroid was rechecked today and the results are pending.  If any dose adjustment is needed we will call the patient let her know later this week.  She will continue to use meals on wheels and ensure.    The patient was advised to call immediately if she has any concerning symptoms in the interval. The patient voices understanding of current disease status and treatment options and is in agreement with the current care plan. All questions were answered. The patient knows to call the clinic with any problems, questions or concerns. We can certainly see the patient much sooner if necessary     No orders of the defined types were placed in this encounter.    . The total time spent in the appointment was 20-29 minutes.   Kasch Borquez L Raeford Brandenburg, PA-C 08/28/22

## 2022-08-28 ENCOUNTER — Other Ambulatory Visit: Payer: Self-pay

## 2022-08-28 ENCOUNTER — Inpatient Hospital Stay: Payer: PPO | Attending: Internal Medicine

## 2022-08-28 ENCOUNTER — Ambulatory Visit: Payer: PPO

## 2022-08-28 ENCOUNTER — Inpatient Hospital Stay (HOSPITAL_BASED_OUTPATIENT_CLINIC_OR_DEPARTMENT_OTHER): Payer: PPO | Admitting: Physician Assistant

## 2022-08-28 ENCOUNTER — Encounter: Payer: Self-pay | Admitting: Internal Medicine

## 2022-08-28 ENCOUNTER — Encounter: Payer: Self-pay | Admitting: Physician Assistant

## 2022-08-28 VITALS — BP 128/60 | HR 86 | Temp 97.5°F | Resp 18 | Wt 145.8 lb

## 2022-08-28 DIAGNOSIS — Z79899 Other long term (current) drug therapy: Secondary | ICD-10-CM | POA: Diagnosis not present

## 2022-08-28 DIAGNOSIS — C3490 Malignant neoplasm of unspecified part of unspecified bronchus or lung: Secondary | ICD-10-CM

## 2022-08-28 DIAGNOSIS — C3432 Malignant neoplasm of lower lobe, left bronchus or lung: Secondary | ICD-10-CM | POA: Insufficient documentation

## 2022-08-28 DIAGNOSIS — D649 Anemia, unspecified: Secondary | ICD-10-CM | POA: Insufficient documentation

## 2022-08-28 DIAGNOSIS — Z79890 Hormone replacement therapy (postmenopausal): Secondary | ICD-10-CM | POA: Insufficient documentation

## 2022-08-28 DIAGNOSIS — N183 Chronic kidney disease, stage 3 unspecified: Secondary | ICD-10-CM | POA: Diagnosis not present

## 2022-08-28 DIAGNOSIS — Z5112 Encounter for antineoplastic immunotherapy: Secondary | ICD-10-CM | POA: Diagnosis not present

## 2022-08-28 DIAGNOSIS — E039 Hypothyroidism, unspecified: Secondary | ICD-10-CM | POA: Diagnosis not present

## 2022-08-28 LAB — CBC WITH DIFFERENTIAL (CANCER CENTER ONLY)
Abs Immature Granulocytes: 0.03 10*3/uL (ref 0.00–0.07)
Basophils Absolute: 0 10*3/uL (ref 0.0–0.1)
Basophils Relative: 1 %
Eosinophils Absolute: 0.2 10*3/uL (ref 0.0–0.5)
Eosinophils Relative: 4 %
HCT: 32.2 % — ABNORMAL LOW (ref 36.0–46.0)
Hemoglobin: 10 g/dL — ABNORMAL LOW (ref 12.0–15.0)
Immature Granulocytes: 0 %
Lymphocytes Relative: 4 %
Lymphs Abs: 0.3 10*3/uL — ABNORMAL LOW (ref 0.7–4.0)
MCH: 28.6 pg (ref 26.0–34.0)
MCHC: 31.1 g/dL (ref 30.0–36.0)
MCV: 92 fL (ref 80.0–100.0)
Monocytes Absolute: 0.4 10*3/uL (ref 0.1–1.0)
Monocytes Relative: 7 %
Neutro Abs: 5.8 10*3/uL (ref 1.7–7.7)
Neutrophils Relative %: 84 %
Platelet Count: 319 10*3/uL (ref 150–400)
RBC: 3.5 MIL/uL — ABNORMAL LOW (ref 3.87–5.11)
RDW: 14.9 % (ref 11.5–15.5)
WBC Count: 6.8 10*3/uL (ref 4.0–10.5)
nRBC: 0 % (ref 0.0–0.2)

## 2022-08-28 LAB — TSH: TSH: 0.436 u[IU]/mL (ref 0.350–4.500)

## 2022-08-28 LAB — CMP (CANCER CENTER ONLY)
ALT: 9 U/L (ref 0–44)
AST: 11 U/L — ABNORMAL LOW (ref 15–41)
Albumin: 3.8 g/dL (ref 3.5–5.0)
Alkaline Phosphatase: 72 U/L (ref 38–126)
Anion gap: 8 (ref 5–15)
BUN: 37 mg/dL — ABNORMAL HIGH (ref 8–23)
CO2: 25 mmol/L (ref 22–32)
Calcium: 9.6 mg/dL (ref 8.9–10.3)
Chloride: 102 mmol/L (ref 98–111)
Creatinine: 1.57 mg/dL — ABNORMAL HIGH (ref 0.44–1.00)
GFR, Estimated: 32 mL/min — ABNORMAL LOW (ref >60)
Glucose, Bld: 152 mg/dL — ABNORMAL HIGH (ref 70–99)
Potassium: 4.3 mmol/L (ref 3.5–5.1)
Sodium: 135 mmol/L (ref 135–145)
Total Bilirubin: 0.2 mg/dL — ABNORMAL LOW (ref 0.3–1.2)
Total Protein: 7.6 g/dL (ref 6.5–8.1)

## 2022-08-28 MED ORDER — SODIUM CHLORIDE 0.9 % IV SOLN: Freq: Once | INTRAVENOUS | Status: AC

## 2022-08-28 MED ORDER — SODIUM CHLORIDE 0.9 % IV SOLN
1500.0000 mg | Freq: Once | INTRAVENOUS | Status: AC
  Administered 2022-08-28: 1500 mg via INTRAVENOUS
  Filled 2022-08-28: qty 30

## 2022-08-28 MED ORDER — LEVOTHYROXINE SODIUM 175 MCG PO TABS: ORAL_TABLET | ORAL | 2 refills | Status: DC

## 2022-08-28 NOTE — Progress Notes (Signed)
Ok to treat with Scr of 1.57 today per provider.

## 2022-08-28 NOTE — Patient Instructions (Signed)
[facility] MEDICAL ONCOLOGY  Discharge Instructions: Thank you for choosing [facility] to provide your oncology and hematology care.   If you have a lab appointment with the Cancer Center, please go directly to the Cancer Center and check in at the registration area.   Wear comfortable clothing and clothing appropriate for easy access to any Portacath or PICC line.   We strive to give you quality time with your provider. You may need to reschedule your appointment if you arrive late (15 or more minutes).  Arriving late affects you and other patients whose appointments are after yours.  Also, if you miss three or more appointments without notifying the office, you may be dismissed from the clinic at the provider's discretion.      For prescription refill requests, have your pharmacy contact our office and allow 72 hours for refills to be completed.    Today you received the following chemotherapy and/or immunotherapy agents; Imfinzi      To help prevent nausea and vomiting after your treatment, we encourage you to take your nausea medication as directed.  BELOW ARE SYMPTOMS THAT SHOULD BE REPORTED IMMEDIATELY: *FEVER GREATER THAN 100.4 F (38 C) OR HIGHER *CHILLS OR SWEATING *NAUSEA AND VOMITING THAT IS NOT CONTROLLED WITH YOUR NAUSEA MEDICATION *UNUSUAL SHORTNESS OF BREATH *UNUSUAL BRUISING OR BLEEDING *URINARY PROBLEMS (pain or burning when urinating, or frequent urination) *BOWEL PROBLEMS (unusual diarrhea, constipation, pain near the anus) TENDERNESS IN MOUTH AND THROAT WITH OR WITHOUT PRESENCE OF ULCERS (sore throat, sores in mouth, or a toothache) UNUSUAL RASH, SWELLING OR PAIN  UNUSUAL VAGINAL DISCHARGE OR ITCHING   Items with * indicate a potential emergency and should be followed up as soon as possible or go to the Emergency Department if any problems should occur.  Please show the CHEMOTHERAPY ALERT CARD or IMMUNOTHERAPY ALERT CARD at check-in to  the Emergency Department and triage nurse.  Should you have questions after your visit or need to cancel or reschedule your appointment, please contact [facility] MEDICAL ONCOLOGY  Dept: [phone]  and follow the prompts.  Office hours are 8:00 a.m. to 4:30 p.m. Monday - Friday. Please note that voicemails left after 4:00 p.m. may not be returned until the following business day.  We are closed weekends and major holidays. You have access to a nurse at all times for urgent questions. Please call the main number to the clinic Dept: [phone] and follow the prompts.   For any non-urgent questions, you may also contact your provider using MyChart. We now offer e-Visits for anyone 18 and older to request care online for non-urgent symptoms. For details visit [email or web address].   Also download the MyChart app! Go to the app store, search "MyChart", open the app, select [facility], and log in with your MyChart username and password.  Masks are optional in the cancer centers. If you would like for your care team to wear a mask while they are taking care of you, please let them know. You may have one support person who is at least 85 years old accompany you for your appointments. 

## 2022-08-29 ENCOUNTER — Telehealth: Payer: Self-pay | Admitting: Internal Medicine

## 2022-08-29 NOTE — Telephone Encounter (Signed)
Called patient regarding upcoming February appointments, patient is notified.

## 2022-08-30 LAB — T4: T4, Total: 9.1 ug/dL (ref 4.5–12.0)

## 2022-09-02 ENCOUNTER — Ambulatory Visit: Payer: Self-pay

## 2022-09-02 NOTE — Patient Instructions (Addendum)
Visit Information  Thank you for taking time to visit with me today. Please don't hesitate to contact me if I can be of assistance to you.   Following are the goals we discussed today:   Goals Addressed               This Visit's Progress     Patient Stated     I need help with medical transportation (pt-stated)        Care Coordination Interventions: Assessed patient understanding of cancer diagnosis and recommended treatment plan Reviewed upcoming provider appointments and treatment appointments Assessed available transportation to appointments and treatments. Has consistent/reliable transportation: No, cancer center is helping with cancer appointments, otherwise patient does not have transportation Determined patient received the resources for transportation and will complete the application in order to get back up transportation lined up  Assessed support system. Has consistent/reliable family or other support: Yes Nutrition assessment performed, patient continues to receive Meals on Wheels and this service is going well for her Sent secure message to [name] FNP advising of patient's symptoms and request to drop off urine sample         Other     Urinary frequency        Care Coordination Interventions: Evaluation of current treatment plan related to Urinary frequency and patient's adherence to plan as established by provider Determined patient completed her full course of antibiotics  Reviewed and discussed patient continues to have nocturia and discolored urine Educated patient on signs/symptoms suggestive of reoccurring UTI and when to seek medical attention Encouraged patient to contact her doctor promptly if symptoms persist or worsen  Discussed patient will consider asking her Oncologist at next scheduled appointment to recheck her urine Advised patient of the lab times per PCP she may come in to drop off a urine sample, patient verbalizes understanding           Our next appointment is by telephone on 10/27/22 at 12:30 PM   Please call the care guide team at [phone] if you need to cancel or reschedule your appointment.   If you are experiencing a Mental Health or [place] or need someone to talk to, please go to [facility] [address], [place] [phone])  Barb Merino, RN, BSN, CCM Care Management Coordinator [facility] Direct Phone: [phone]

## 2022-09-02 NOTE — Patient Outreach (Addendum)
  Care Coordination   Follow Up Visit Note   09/02/2022 Name: Abigail [name] MRN: [id number] DOB: Feb 27, 1936  Abigail [name] is a 87 y.o. year old female who sees [name], [place] for primary care. I spoke with  [name] by phone today.  What matters to the patients health and wellness today?  Patient will continue to adhere to her prescribed treatment plan for Cancer treatment. She will report new or worsening symptoms related to urinary frequency to her doctor promptly.     Goals Addressed               This Visit's Progress     Patient Stated     I need help with medical transportation (pt-stated)        Care Coordination Interventions: Assessed patient understanding of cancer diagnosis and recommended treatment plan Reviewed upcoming provider appointments and treatment appointments Assessed available transportation to appointments and treatments. Has consistent/reliable transportation: No, cancer center is helping with cancer appointments, otherwise patient does not have transportation Determined patient received the resources for transportation and will complete the application in order to get back up transportation lined up  Assessed support system. Has consistent/reliable family or other support: Yes Nutrition assessment performed, patient continues to receive Meals on Wheels and this service is going well for her Determined patient feels she is doing well overall, she continues to wear her continuous Oxygen as directed        Other     Urinary frequency        Care Coordination Interventions: Evaluation of current treatment plan related to Urinary frequency and patient's adherence to plan as established by provider Determined patient completed her full course of antibiotics  Reviewed and discussed patient continues to have nocturia and discolored urine Educated patient on signs/symptoms suggestive of reoccurring UTI and when to seek medical attention Encouraged patient  to contact her doctor promptly if symptoms persist or worsen  Sent secure message to [name] FNP advising of patient's symptoms and request to drop off urine sample  Advised patient of the lab times per PCP she may come in to drop off a urine sample, patient verbalizes understanding          SDOH assessments and interventions completed:  No     Care Coordination Interventions:  Yes, provided   Follow up plan: Follow up call scheduled for 10/27/22 @12 :30 PM     Encounter Outcome:  Pt. Visit Completed

## 2022-09-06 ENCOUNTER — Observation Stay (HOSPITAL_COMMUNITY)
Admission: EM | Admit: 2022-09-06 | Discharge: 2022-09-07 | Disposition: A | Discharge status: Home / Self Care | Payer: PPO | Attending: Internal Medicine | Admitting: Internal Medicine

## 2022-09-06 ENCOUNTER — Other Ambulatory Visit: Payer: Self-pay

## 2022-09-06 ENCOUNTER — Emergency Department (HOSPITAL_COMMUNITY): Payer: PPO

## 2022-09-06 DIAGNOSIS — J9611 Chronic respiratory failure with hypoxia: Secondary | ICD-10-CM | POA: Diagnosis present

## 2022-09-06 DIAGNOSIS — Z85118 Personal history of other malignant neoplasm of bronchus and lung: Secondary | ICD-10-CM | POA: Insufficient documentation

## 2022-09-06 DIAGNOSIS — I10 Essential (primary) hypertension: Secondary | ICD-10-CM | POA: Diagnosis present

## 2022-09-06 DIAGNOSIS — N200 Calculus of kidney: Secondary | ICD-10-CM

## 2022-09-06 DIAGNOSIS — D509 Iron deficiency anemia, unspecified: Secondary | ICD-10-CM | POA: Diagnosis present

## 2022-09-06 DIAGNOSIS — K429 Umbilical hernia without obstruction or gangrene: Secondary | ICD-10-CM | POA: Diagnosis not present

## 2022-09-06 DIAGNOSIS — Z7982 Long term (current) use of aspirin: Secondary | ICD-10-CM | POA: Diagnosis not present

## 2022-09-06 DIAGNOSIS — R350 Frequency of micturition: Secondary | ICD-10-CM | POA: Diagnosis not present

## 2022-09-06 DIAGNOSIS — R197 Diarrhea, unspecified: Secondary | ICD-10-CM | POA: Diagnosis not present

## 2022-09-06 DIAGNOSIS — I5033 Acute on chronic diastolic (congestive) heart failure: Secondary | ICD-10-CM | POA: Diagnosis present

## 2022-09-06 DIAGNOSIS — J449 Chronic obstructive pulmonary disease, unspecified: Secondary | ICD-10-CM | POA: Diagnosis present

## 2022-09-06 DIAGNOSIS — K449 Diaphragmatic hernia without obstruction or gangrene: Secondary | ICD-10-CM | POA: Diagnosis not present

## 2022-09-06 DIAGNOSIS — N132 Hydronephrosis with renal and ureteral calculous obstruction: Secondary | ICD-10-CM | POA: Diagnosis not present

## 2022-09-06 DIAGNOSIS — Z79899 Other long term (current) drug therapy: Secondary | ICD-10-CM | POA: Insufficient documentation

## 2022-09-06 DIAGNOSIS — N201 Calculus of ureter: Secondary | ICD-10-CM | POA: Diagnosis present

## 2022-09-06 DIAGNOSIS — N1832 Chronic kidney disease, stage 3b: Secondary | ICD-10-CM | POA: Diagnosis present

## 2022-09-06 DIAGNOSIS — N2889 Other specified disorders of kidney and ureter: Secondary | ICD-10-CM | POA: Diagnosis not present

## 2022-09-06 DIAGNOSIS — R319 Hematuria, unspecified: Secondary | ICD-10-CM | POA: Diagnosis not present

## 2022-09-06 DIAGNOSIS — I5032 Chronic diastolic (congestive) heart failure: Secondary | ICD-10-CM | POA: Insufficient documentation

## 2022-09-06 DIAGNOSIS — I130 Hypertensive heart and chronic kidney disease with heart failure and stage 1 through stage 4 chronic kidney disease, or unspecified chronic kidney disease: Secondary | ICD-10-CM | POA: Diagnosis present

## 2022-09-06 DIAGNOSIS — E039 Hypothyroidism, unspecified: Secondary | ICD-10-CM | POA: Diagnosis present

## 2022-09-06 DIAGNOSIS — R0902 Hypoxemia: Secondary | ICD-10-CM | POA: Diagnosis not present

## 2022-09-06 DIAGNOSIS — K5730 Diverticulosis of large intestine without perforation or abscess without bleeding: Secondary | ICD-10-CM | POA: Diagnosis not present

## 2022-09-06 DIAGNOSIS — E785 Hyperlipidemia, unspecified: Secondary | ICD-10-CM | POA: Diagnosis present

## 2022-09-06 DIAGNOSIS — N390 Urinary tract infection, site not specified: Secondary | ICD-10-CM | POA: Diagnosis present

## 2022-09-06 DIAGNOSIS — N289 Disorder of kidney and ureter, unspecified: Secondary | ICD-10-CM

## 2022-09-06 DIAGNOSIS — R310 Gross hematuria: Secondary | ICD-10-CM | POA: Diagnosis present

## 2022-09-06 LAB — URINALYSIS, ROUTINE W REFLEX MICROSCOPIC
Bilirubin Urine: NEGATIVE
Glucose, UA: NEGATIVE mg/dL
Ketones, ur: NEGATIVE mg/dL
Nitrite: NEGATIVE
Protein, ur: 100 mg/dL — AB
RBC / HPF: >50 RBC/hpf — ABNORMAL HIGH (ref 0–5)
Specific Gravity, Urine: 1.011 (ref 1.005–1.030)
pH: 7 (ref 5.0–8.0)

## 2022-09-06 LAB — CBC WITH DIFFERENTIAL/PLATELET
Abs Immature Granulocytes: 0.03 10*3/uL (ref 0.00–0.07)
Basophils Absolute: 0 10*3/uL (ref 0.0–0.1)
Basophils Relative: 1 %
Eosinophils Absolute: 0.2 10*3/uL (ref 0.0–0.5)
Eosinophils Relative: 3 %
HCT: 29.6 % — ABNORMAL LOW (ref 36.0–46.0)
Hemoglobin: 8.9 g/dL — ABNORMAL LOW (ref 12.0–15.0)
Immature Granulocytes: 1 %
Lymphocytes Relative: 7 %
Lymphs Abs: 0.5 10*3/uL — ABNORMAL LOW (ref 0.7–4.0)
MCH: 28.3 pg (ref 26.0–34.0)
MCHC: 30.1 g/dL (ref 30.0–36.0)
MCV: 94.3 fL (ref 80.0–100.0)
Monocytes Absolute: 0.7 10*3/uL (ref 0.1–1.0)
Monocytes Relative: 12 %
Neutro Abs: 4.8 10*3/uL (ref 1.7–7.7)
Neutrophils Relative %: 76 %
Platelets: 286 10*3/uL (ref 150–400)
RBC: 3.14 MIL/uL — ABNORMAL LOW (ref 3.87–5.11)
RDW: 14.9 % (ref 11.5–15.5)
WBC: 6.2 10*3/uL (ref 4.0–10.5)
nRBC: 0 % (ref 0.0–0.2)

## 2022-09-06 LAB — BASIC METABOLIC PANEL
Anion gap: 10 (ref 5–15)
BUN: 47 mg/dL — ABNORMAL HIGH (ref 8–23)
CO2: 24 mmol/L (ref 22–32)
Calcium: 8.8 mg/dL — ABNORMAL LOW (ref 8.9–10.3)
Chloride: 102 mmol/L (ref 98–111)
Creatinine, Ser: 1.62 mg/dL — ABNORMAL HIGH (ref 0.44–1.00)
GFR, Estimated: 31 mL/min — ABNORMAL LOW (ref >60)
Glucose, Bld: 110 mg/dL — ABNORMAL HIGH (ref 70–99)
Potassium: 4.7 mmol/L (ref 3.5–5.1)
Sodium: 136 mmol/L (ref 135–145)

## 2022-09-06 NOTE — ED Provider Notes (Signed)
[facility] DEPT Provider Note   CSN: [id number] Arrival date & time: 09/06/22  2026     History  Chief Complaint  Patient presents with   Hematuria    Abigail [name] is a 87 y.o. female.  Patient is an 87 year old female with a past medical history of lung cancer on chronic home O2 and on immunotherapy, hypertension, COPD presenting to the emergency department with hematuria.  The patient states the last few nights she has had increased urinary frequency and urgency.  She states that her urine appears darker than usual and today she noted some blood in her urine.  She states that she has had intermittent pain in her right groin that comes and goes.  She denies any abdominal or flank pain.  She denies any nausea, vomiting, diarrhea or constipation.  She denies any fevers or chills.  He states that she was recently treated for a UTI few weeks ago and is unsure if it ever completely cleared up.   Hematuria       Home Medications Prior to Admission medications   Medication Sig Start Date End Date Taking? Authorizing Provider  acetaminophen (TYLENOL) 325 MG tablet Take 2 tablets (650 mg total) by mouth every 6 (six) hours as needed for mild pain (or Fever >/= 101). 11/10/20   Nita Sells, MD  albuterol (VENTOLIN HFA) 108 (90 Base) MCG/ACT inhaler TAKE 2 PUFFS BY MOUTH EVERY 6 HOURS AS NEEDED FOR WHEEZE OR SHORTNESS OF BREATH 12/20/21   Heilingoetter, Cassandra L, PA-C  aspirin EC 81 MG tablet Take 81 mg by mouth at bedtime.    [provider]  atorvastatin (LIPITOR) 80 MG tablet Take 1 tablet (80 mg total) by mouth daily at 6 PM. 09/18/20   Jettie Booze, MD  budesonide-formoterol [facility]) 80-4.5 MCG/ACT inhaler Inhale 2 puffs into the lungs 2 (two) times daily. 07/22/22   [name], FNP  Cholecalciferol (VITAMIN D3 PO) Take 2 capsules by mouth daily. Patient not taking: Reported on 08/05/2022    [provider]   Cranberry 1000 MG CAPS Take 1,000 mg by mouth daily at 12 noon.    [provider]  cyclobenzaprine (FLEXERIL) 10 MG tablet Take 1 tablet (10 mg total) by mouth 3 (three) times daily as needed for muscle spasms. 07/22/22   [name], FNP  docusate sodium (COLACE) 100 MG capsule Take 100-200 mg by mouth See admin instructions. Take 100 mg in the morning and 200 mg at bedtime    [provider]  esomeprazole (NEXIUM) 20 MG capsule Take 1 capsule (20 mg total) by mouth 2 (two) times daily before a meal. Patient not taking: Reported on 07/31/2022 09/11/21   Sharyn Creamer, MD  estradiol (ESTRACE) 0.1 MG/GM vaginal cream Place 0.5 g vaginally once a week. Place 0.5g twice a week at opening of vagina 07/15/21   Collene Gobble, MD  ezetimibe (ZETIA) 10 MG tablet Take 1 tablet (10 mg total) by mouth daily. 07/31/21   Werner Lean, MD  ferrous sulfate 325 (65 FE) MG EC tablet Take 1 tablet (325 mg total) by mouth daily with breakfast. 07/03/22   Heilingoetter, Cassandra L, PA-C  hydrOXYzine (ATARAX) 10 MG tablet Take 1 tablet (10 mg total) by mouth 3 (three) times daily as needed. 11/26/21   [name], FNP  levothyroxine (SYNTHROID) 175 MCG tablet Take 175 mcg (1 tablet) by mouth daily Monday-Friday 08/28/22   Heilingoetter, Cassandra L, PA-C  lisinopril-hydrochlorothiazide (ZESTORETIC) 10-12.5  MG tablet TAKE 1 TABLET BY MOUTH EVERY DAY 08/26/22   [name], FNP  nitroGLYCERIN (NITROSTAT) 0.4 MG SL tablet Place 1 tablet (0.4 mg total) under the tongue every 5 (five) minutes x 3 doses as needed for chest pain. 07/22/22   [name], FNP  OXYGEN Inhale into the lungs.    [provider]  prochlorperazine (COMPAZINE) 10 MG tablet Take 1 tablet (10 mg total) by mouth every 6 (six) hours as needed for nausea or vomiting. 08/28/21   Heilingoetter, Cassandra L, PA-C  Trospium Chloride 60 MG CP24 Take 1 capsule (60 mg total) by mouth daily. 10/04/21   Marguerita Beards, MD       Allergies    Codeine and Norvasc [amlodipine]    Review of Systems   Review of Systems  Genitourinary:  Positive for hematuria.    Physical Exam Updated Vital Signs BP (!) 136/58   Pulse 74   Temp 97.8 F (36.6 C)   Resp (!) 22   SpO2 96%  Physical Exam Vitals and nursing note reviewed.  Constitutional:      General: She is not in acute distress.    Appearance: Normal appearance.  HENT:     Head: Normocephalic and atraumatic.     Nose: Nose normal.     Comments: Nasal cannula in place    Mouth/Throat:     Mouth: Mucous membranes are moist.     Pharynx: Oropharynx is clear.  Eyes:     Extraocular Movements: Extraocular movements intact.     Conjunctiva/sclera: Conjunctivae normal.  Cardiovascular:     Rate and Rhythm: Normal rate and regular rhythm.     Heart sounds: Normal heart sounds.  Pulmonary:     Effort: Pulmonary effort is normal.     Breath sounds: Normal breath sounds.  Abdominal:     General: Abdomen is flat.     Palpations: Abdomen is soft.     Tenderness: There is no abdominal tenderness. There is no right CVA tenderness or left CVA tenderness.  Musculoskeletal:        General: Normal range of motion.     Cervical back: Normal range of motion and neck supple.  Skin:    General: Skin is warm and dry.  Neurological:     General: No focal deficit present.     Mental Status: She is alert and oriented to person, place, and time.  Psychiatric:        Mood and Affect: Mood normal.        Behavior: Behavior normal.     ED Results / Procedures / Treatments   Labs (all labs ordered are listed, but only abnormal results are displayed) Labs Reviewed  URINALYSIS, ROUTINE W REFLEX MICROSCOPIC - Abnormal; Notable for the following components:      Result Value   Color, Urine RED (*)    APPearance CLOUDY (*)    Hgb urine dipstick LARGE (*)    Protein, ur 100 (*)    Leukocytes,Ua LARGE (*)    RBC / HPF >50 (*)    Bacteria, UA RARE (*)    All  other components within normal limits  BASIC METABOLIC PANEL - Abnormal; Notable for the following components:   Glucose, Bld 110 (*)    BUN 47 (*)    Creatinine, Ser 1.62 (*)    Calcium 8.8 (*)    GFR, Estimated 31 (*)    All other components within normal limits  CBC WITH DIFFERENTIAL/PLATELET -  Abnormal; Notable for the following components:   RBC 3.14 (*)    Hemoglobin 8.9 (*)    HCT 29.6 (*)    Lymphs Abs 0.5 (*)    All other components within normal limits  URINE CULTURE    EKG None  Radiology No results found.  Procedures Procedures    Medications Ordered in ED Medications - No data to display  ED Course/ Medical Decision Making/ A&P Clinical Course as of 09/06/22 2345  Sat Sep 06, 2022  2253 UA with leukocytes, RBCs but rare bacteria. CT stone search will be ordered with R groin pain. Urine culture will be sent. [VK]  2254 Remainder of labs at patient's baseline. [VK]  2332 87 yo female, lung cancer on 3LNC/copd. Hematuria w/ RLQ pain w/ frequency/urgency. Labs similar to baseline. Pain well controlled. CT renal pending.  [SG]    Clinical Course User Index [SG] Sloan Leiter, DO [VK] Rexford Maus, DO                             Medical Decision Making This patient presents to the ED with chief complaint(s) of hematuria with pertinent past medical history of lung cancer on immunotherapy, COPD, hypertension which further complicates the presenting complaint. The complaint involves an extensive differential diagnosis and also carries with it a high risk of complications and morbidity.    The differential diagnosis includes UTI, nephrolithiasis, pyelonephritis less likely as no CVA tenderness, dehydration, coagulopathy, anemia  Additional history obtained: Additional history obtained from N/A Records reviewed outpatient oncology records  ED Course and Reassessment: On patient's arrival to the emergency department she is awake alert well-appearing in  no acute distress.  Patient will have labs and urine performed to evaluate for cause of her hematuria and she will be closely reassessed.  Independent labs interpretation:  The following labs were independently interpreted: UA positive for UTI, labs otherwise at baseline  Independent visualization of imaging: Pending     Amount and/or Complexity of Data Reviewed Labs: ordered. Radiology: ordered.          Final Clinical Impression(s) / ED Diagnoses Final diagnoses:  None    Rx / DC Orders ED Discharge Orders     None         Rexford Maus, DO 09/06/22 2345

## 2022-09-06 NOTE — ED Triage Notes (Signed)
Pt arrive EMS with reports of blood in urine and abdominal cramping for the last two days. Pt endorses diarrhea x2 days that is dark in color. Pt reports hx of lung CA and is on immunotherapy. Pt denies taking blood thinners.

## 2022-09-07 ENCOUNTER — Encounter (HOSPITAL_COMMUNITY): Payer: Self-pay | Admitting: Internal Medicine

## 2022-09-07 DIAGNOSIS — I5032 Chronic diastolic (congestive) heart failure: Secondary | ICD-10-CM

## 2022-09-07 DIAGNOSIS — N201 Calculus of ureter: Secondary | ICD-10-CM | POA: Diagnosis present

## 2022-09-07 DIAGNOSIS — N200 Calculus of kidney: Secondary | ICD-10-CM | POA: Diagnosis not present

## 2022-09-07 DIAGNOSIS — J449 Chronic obstructive pulmonary disease, unspecified: Secondary | ICD-10-CM

## 2022-09-07 DIAGNOSIS — N1832 Chronic kidney disease, stage 3b: Secondary | ICD-10-CM | POA: Diagnosis not present

## 2022-09-07 DIAGNOSIS — J9611 Chronic respiratory failure with hypoxia: Secondary | ICD-10-CM | POA: Diagnosis not present

## 2022-09-07 DIAGNOSIS — D509 Iron deficiency anemia, unspecified: Secondary | ICD-10-CM

## 2022-09-07 DIAGNOSIS — E782 Mixed hyperlipidemia: Secondary | ICD-10-CM

## 2022-09-07 DIAGNOSIS — E039 Hypothyroidism, unspecified: Secondary | ICD-10-CM | POA: Diagnosis not present

## 2022-09-07 DIAGNOSIS — R310 Gross hematuria: Secondary | ICD-10-CM | POA: Diagnosis not present

## 2022-09-07 DIAGNOSIS — N3001 Acute cystitis with hematuria: Secondary | ICD-10-CM

## 2022-09-07 DIAGNOSIS — R1031 Right lower quadrant pain: Secondary | ICD-10-CM | POA: Diagnosis not present

## 2022-09-07 DIAGNOSIS — I10 Essential (primary) hypertension: Secondary | ICD-10-CM | POA: Diagnosis not present

## 2022-09-07 DIAGNOSIS — N281 Cyst of kidney, acquired: Secondary | ICD-10-CM | POA: Diagnosis not present

## 2022-09-07 LAB — CBC WITH DIFFERENTIAL/PLATELET
Abs Immature Granulocytes: 0.02 10*3/uL (ref 0.00–0.07)
Basophils Absolute: 0 10*3/uL (ref 0.0–0.1)
Basophils Relative: 1 %
Eosinophils Absolute: 0.2 10*3/uL (ref 0.0–0.5)
Eosinophils Relative: 4 %
HCT: 27.6 % — ABNORMAL LOW (ref 36.0–46.0)
Hemoglobin: 8.3 g/dL — ABNORMAL LOW (ref 12.0–15.0)
Immature Granulocytes: 0 %
Lymphocytes Relative: 7 %
Lymphs Abs: 0.4 10*3/uL — ABNORMAL LOW (ref 0.7–4.0)
MCH: 28.3 pg (ref 26.0–34.0)
MCHC: 30.1 g/dL (ref 30.0–36.0)
MCV: 94.2 fL (ref 80.0–100.0)
Monocytes Absolute: 0.8 10*3/uL (ref 0.1–1.0)
Monocytes Relative: 14 %
Neutro Abs: 4.3 10*3/uL (ref 1.7–7.7)
Neutrophils Relative %: 74 %
Platelets: 263 10*3/uL (ref 150–400)
RBC: 2.93 MIL/uL — ABNORMAL LOW (ref 3.87–5.11)
RDW: 15 % (ref 11.5–15.5)
WBC: 5.8 10*3/uL (ref 4.0–10.5)
nRBC: 0 % (ref 0.0–0.2)

## 2022-09-07 LAB — COMPREHENSIVE METABOLIC PANEL
ALT: 9 U/L (ref 0–44)
AST: 11 U/L — ABNORMAL LOW (ref 15–41)
Albumin: 3.1 g/dL — ABNORMAL LOW (ref 3.5–5.0)
Alkaline Phosphatase: 57 U/L (ref 38–126)
Anion gap: 6 (ref 5–15)
BUN: 45 mg/dL — ABNORMAL HIGH (ref 8–23)
CO2: 24 mmol/L (ref 22–32)
Calcium: 8.6 mg/dL — ABNORMAL LOW (ref 8.9–10.3)
Chloride: 104 mmol/L (ref 98–111)
Creatinine, Ser: 1.45 mg/dL — ABNORMAL HIGH (ref 0.44–1.00)
GFR, Estimated: 36 mL/min — ABNORMAL LOW (ref >60)
Glucose, Bld: 115 mg/dL — ABNORMAL HIGH (ref 70–99)
Potassium: 4.5 mmol/L (ref 3.5–5.1)
Sodium: 134 mmol/L — ABNORMAL LOW (ref 135–145)
Total Bilirubin: 0.3 mg/dL (ref 0.3–1.2)
Total Protein: 6.8 g/dL (ref 6.5–8.1)

## 2022-09-07 LAB — MAGNESIUM
Magnesium: 2.4 mg/dL (ref 1.7–2.4)
Magnesium: 2.5 mg/dL — ABNORMAL HIGH (ref 1.7–2.4)

## 2022-09-07 LAB — PROTIME-INR
INR: 1.1 (ref 0.8–1.2)
Prothrombin Time: 14.3 seconds (ref 11.4–15.2)

## 2022-09-07 LAB — TYPE AND SCREEN
ABO/RH(D): B POS
Antibody Screen: NEGATIVE

## 2022-09-07 MED ORDER — FENTANYL CITRATE PF 50 MCG/ML IJ SOSY: 25.0000 ug | PREFILLED_SYRINGE | INTRAMUSCULAR | Status: DC | PRN

## 2022-09-07 MED ORDER — SODIUM CHLORIDE 0.9 % IV SOLN
1.0000 g | Freq: Once | INTRAVENOUS | Status: AC
  Administered 2022-09-07: 1 g via INTRAVENOUS
  Filled 2022-09-07: qty 10

## 2022-09-07 MED ORDER — MOMETASONE FURO-FORMOTEROL FUM 100-5 MCG/ACT IN AERO
2.0000 | INHALATION_SPRAY | Freq: Two times a day (BID) | RESPIRATORY_TRACT | Status: DC
  Filled 2022-09-07: qty 8.8

## 2022-09-07 MED ORDER — LACTATED RINGERS IV SOLN: INTRAVENOUS | Status: DC

## 2022-09-07 MED ORDER — ACETAMINOPHEN 650 MG RE SUPP: 650.0000 mg | Freq: Four times a day (QID) | RECTAL | Status: DC | PRN

## 2022-09-07 MED ORDER — ACETAMINOPHEN 325 MG PO TABS: 650.0000 mg | ORAL_TABLET | Freq: Four times a day (QID) | ORAL | Status: DC | PRN

## 2022-09-07 MED ORDER — ALBUTEROL SULFATE (2.5 MG/3ML) 0.083% IN NEBU: 2.5000 mg | INHALATION_SOLUTION | RESPIRATORY_TRACT | Status: DC | PRN

## 2022-09-07 MED ORDER — CEFPODOXIME PROXETIL 200 MG PO TABS: 200.0000 mg | ORAL_TABLET | Freq: Every day | ORAL | 0 refills | Status: AC

## 2022-09-07 MED ORDER — NALOXONE HCL 0.4 MG/ML IJ SOLN: 0.4000 mg | INTRAMUSCULAR | Status: DC | PRN

## 2022-09-07 MED ORDER — ONDANSETRON HCL 4 MG/2ML IJ SOLN: 4.0000 mg | Freq: Four times a day (QID) | INTRAMUSCULAR | Status: DC | PRN

## 2022-09-07 MED ORDER — SODIUM CHLORIDE 0.9 % IV SOLN: 1.0000 g | INTRAVENOUS | Status: DC

## 2022-09-07 NOTE — ED Notes (Signed)
Abigail [name] called to speak to patient about serving patient a code 33. Tried to transfer call but it did not work. Patient's cell phone number is [phone] in case she calls back.

## 2022-09-07 NOTE — Discharge Instructions (Signed)
Recommend follow up with pcp to schedule MRI to better evaluate nodule found on left kidney

## 2022-09-07 NOTE — Care Management CC44 (Signed)
Condition Code 44 Documentation Completed  Patient Details  Name: Abigail [name] MRN: [id number] Date of Birth: 1936-05-10   Condition Code 44 given:  Yes Patient signature on Condition Code 44 notice:  Yes (Verbal Signature) Documentation of 2 MD's agreement:  Yes Code 44 added to claim:  Yes    Chasitty Hehl C Tarpley-Carter, LCSWA 09/07/2022, 9:45 AM

## 2022-09-07 NOTE — ED Provider Notes (Addendum)
  Provider Note MRN:  [id number]  Arrival date & time: 09/07/22    ED Course and Medical Decision Making  Assumed care from [facility] at shift change.   Clinical Course as of 09/07/22 0253  Sat Sep 06, 2022  2253 UA with leukocytes, RBCs but rare bacteria. CT stone search will be ordered with R groin pain. Urine culture will be sent. [VK]  2254 Remainder of labs at patient's baseline. [VK]  2332 87 yo female, lung cancer on 3LNC/copd. Hematuria w/ RLQ pain w/ frequency/urgency. Labs similar to baseline. Pain well controlled. CT renal pending.  [SG]  Sun Sep 07, 2022  0036 CT resulted, multiple stones on the left up to 2.3 cm, mild hydro. Obstructive. Also lesion on liver, recommend MRI to f/u.  Pt re-assessed, pain is well controlled. No n/v. Abd is non surgical. [SG]  4098 Paged urology, pt with stone around 60 years ago but since then has not had a problem [SG]  0117 Re paged urology, awaiting call back [SG]  0208 Paged urology again, Dr Marlou Porch [SG]  [phone] Spoke with Dr Marlou Porch, will require surgery, will see her in the morning. Admit to [facility] [SG]  0232 Spoke with Dr Arlean Hopping who accepts pt for admit [SG]  0251 Given empiric rocephin, she had rare bacteria in her ua; not septic  [SG]    Clinical Course User Index [SG] Sloan Leiter, DO [VK] Rexford Maus, DO    Admitted to [facility], uro to see in AM for surgical eval, keep NPO. Continue 3LNC which is her baseline. Pain well controlled currently, HDS and agreeable to admission.     Procedures  Final Clinical Impressions(s) / ED Diagnoses     ICD-10-CM   1. Nephrolithiasis  N20.0     2. Hematuria, unspecified type  R31.9     3. Kidney lesion  N28.9       ED Discharge Orders     None         Discharge Instructions      Recommend follow up with pcp to schedule MRI to better evaluate nodule found on left kidney         Sloan Leiter, DO 09/07/22 0251    Sloan Leiter, DO 09/07/22 4782

## 2022-09-07 NOTE — Progress Notes (Signed)
TOC CSW CC44 has been served.    Mattias Walmsley Tarpley-Carter, MSW, LCSW-A Pronouns:  She/Her/Hers [facility] HealthTransitions of Care Clinical Social Worker Direct Number:  [phone] [email or web address]

## 2022-09-07 NOTE — H&P (Signed)
History and Physical      GARRY BOCHICCHIO GQM:[id number] DOB: 06-29-1936 DOA: 09/06/2022  PCP: [name], FNP  Patient coming from: home   I have personally briefly reviewed patient's old medical records in [facility]  Chief Complaint: left flank pain  HPI: Abigail [name] is a 87 y.o. female with medical history significant for lung cancer, COPD, chronic hypoxic respiratory failure on continuous 3 L nasal cannula, chronic iron deficiency anemia [place] baseline hemoglobin 8-10, stage IIIb CKD with baseline creatinine 1.3-1.6, essential hypertension, hyperlipidemia, chronic diastolic heart failure, who is admitted to [facility] on 09/06/2022 with obstructing left kidney stones after presenting from home to [facility] ED complaining of left flank pain.   The patient reports 3 to 4 days of progressive sharp left-sided abdominal/left flank discomfort, worse with palpation over the left portion of the abdomen and associated with new onset gross hematuria over that timeframe.  Denies overt dysuria.  She also denies associated subjective fever, chills, rigors, or generalized myalgias.  No recent trauma.  Denies any known history of prior kidney stones, and acknowledges taking HCTZ as a component of her outpatient antihypertensive regimen.  Medical history notable for lung cancer, COPD, and chronic hypoxic respiratory failure on 3 L continuous nasal cannula.  She also has a history of chronic diastolic heart failure, most recent echocardiogram performed in May 2023, which is notable for LVEF 65 to 70%, no focal motion abnormalities, grade 1 diastolic dysfunction and normal right ventricular systolic function.  Denies any acute worsening of her shortness of breath or any recent orthopnea, PND, or worsening of peripheral edema.  Denies any recent chest pain, palpitations, diaphoresis, dizziness, presyncope, or syncope.  She is on a daily baby aspirin at home, but otherwise on no additional blood thinners  as an outpatient.     ED Course:  Vital signs in the ED were notable for the following: Afebrile; heart rate 72-80; systolic blood pressures in the 120s to 150s; respiratory rate 19-23, oxygen saturation 96% on her baseline 3 L nasal cannula.  Labs were notable for the following: BMP notable for the following: Sodium 136, bicarbonate 24, creatinine 1.16 compared to most recent prior value of 1.57 on 08/28/2022.  CBC notable for white blood cell count 6200 with 76% neutrophils, hemoglobin 8.9 associated normocytic/normochromic properties as well as nonelevated RDW, and relative to most recent prior hemoglobin data point of 10 on 08/28/2022.  Urinalysis with a cloudy appearing specimen and was notable for 21-50 white blood cells, large visit esterase, no evidence of will cells and also showed large hemoglobin with greater than 50 red blood cells.  Urine culture collected prior to initiation of Rocephin.  Imaging and additional notable ED work-up: CT renal stone showed evidence of multiple obstructive large left Kidney stones, measuring up to 2.3 cm, associated with mild left hydronephrosis.   EDP discussed patient's case and imaging with the on-call urologist, Dr. Marlou Porch, Who recommended admission to the hospitalist service, and conveyed that he will formally consult and see the patient in the morning, with potential for stent placement versus potential need for more invasive surgical procedure as management of patient's multiple large obstructing left kidney stones.   While in the ED, the following were administered: Rocephin.  Subsequently, the patient was admitted for further evaluation management of presenting obstructing left kidney stones complicated by urinary tract infection and gross hematuria.    Review of Systems: As per HPI otherwise 10 point review of systems negative.  Past Medical History:  Diagnosis Date   Anemia    Arthritis    knees, back   Carotid artery occlusion     Chronic kidney disease    stage 3 ckd no nephrologist   Complete uterine prolapse with prolapse of anterior vaginal wall    Complication of anesthesia    hard to wake up per pt   Constipation    Coronary artery disease    Diverticulitis yrs ago coialitis   Dyspnea    History of blood transfusion    History of radiation therapy    right lung 08/05/2021-09/18/2021  Dr Antony Blackbird   Hypertension    Hypothyroid    Lung cancer (HCC)    Numbness    in hands at times   Pneumonia    Pre-diabetes    Scoliosis    STEMI (ST elevation myocardial infarction) (HCC) 10/26/2019   DES RCA   Wears dentures    full dentures   Wears glasses    for reading    Past Surgical History:  Procedure Laterality Date   ANTERIOR AND POSTERIOR REPAIR WITH SACROSPINOUS FIXATION N/A 12/20/2020   Procedure: SACROSPINOUS LIGAMENT FIXATION;  Surgeon: Marguerita Beards, MD;  Location: [facility];  Service: Gynecology;  Laterality: N/A;  Total time requested for all procedures is 2 hours   BACK SURGERY  1980 age 28   lower   bartholin cyst removal  age 75's   BLADDER SUSPENSION N/A 12/20/2020   Procedure: TRANSVAGINAL TAPE (TVT) PROCEDURE;  Surgeon: Marguerita Beards, MD;  Location: [facility];  Service: Gynecology;  Laterality: N/A;   BRONCHIAL BRUSHINGS  07/15/2021   Procedure: BRONCHIAL BRUSHINGS;  Surgeon: Leslye Peer, MD;  Location: [facility] ENDOSCOPY;  Service: Pulmonary;;   BRONCHIAL NEEDLE ASPIRATION BIOPSY  07/15/2021   Procedure: BRONCHIAL NEEDLE ASPIRATION BIOPSIES;  Surgeon: Leslye Peer, MD;  Location: [facility] ENDOSCOPY;  Service: Pulmonary;;   CORONARY/GRAFT ACUTE MI REVASCULARIZATION N/A 10/26/2019   Procedure: Coronary/Graft Acute MI Revascularization;  Surgeon: Tonny Bollman, MD;  Location: [facility] INVASIVE CV LAB;  Service: Cardiovascular;  Laterality: N/A;   CYSTOCELE REPAIR N/A 12/20/2020   Procedure: ANTERIOR AND POSTERIOR REPAIR WITH PERINEORRHAPHY;   Surgeon: Marguerita Beards, MD;  Location: [facility];  Service: Gynecology;  Laterality: N/A;   CYSTOSCOPY N/A 12/20/2020   Procedure: CYSTOSCOPY;  Surgeon: Marguerita Beards, MD;  Location: [facility];  Service: Gynecology;  Laterality: N/A;   ELBOW SURGERY  1990's   left   ENDOBRONCHIAL ULTRASOUND N/A 07/15/2021   Procedure: ENDOBRONCHIAL ULTRASOUND;  Surgeon: Leslye Peer, MD;  Location: [facility] ENDOSCOPY;  Service: Pulmonary;  Laterality: N/A;   HEMOSTASIS CONTROL  07/15/2021   Procedure: HEMOSTASIS CONTROL;  Surgeon: Leslye Peer, MD;  Location: [facility] ENDOSCOPY;  Service: Pulmonary;;   LEFT HEART CATH AND CORONARY ANGIOGRAPHY N/A 10/26/2019   Procedure: LEFT HEART CATH AND CORONARY ANGIOGRAPHY;  Surgeon: Tonny Bollman, MD;  Location: [facility] INVASIVE CV LAB;  Service: Cardiovascular;  Laterality: N/A;   TONSILLECTOMY      Social History:  reports that she quit smoking about 36 years ago. Her smoking use included cigarettes. She has a 52.50 pack-year smoking history. She has never used smokeless tobacco. She reports that she does not drink alcohol and does not use drugs.   Allergies  Allergen Reactions   Codeine Nausea And Vomiting   Norvasc [Amlodipine] Rash and Other (See Comments)    rash    Family  History  Problem Relation Age of Onset   Hypertension Mother    Colon cancer Neg Hx    Stomach cancer Neg Hx    Esophageal cancer Neg Hx     Family history reviewed and not pertinent    Prior to Admission medications   Medication Sig Start Date End Date Taking? Authorizing Provider  acetaminophen (TYLENOL) 325 MG tablet Take 2 tablets (650 mg total) by mouth every 6 (six) hours as needed for mild pain (or Fever >/= 101). 11/10/20   Rhetta Mura, MD  albuterol (VENTOLIN HFA) 108 (90 Base) MCG/ACT inhaler TAKE 2 PUFFS BY MOUTH EVERY 6 HOURS AS NEEDED FOR WHEEZE OR SHORTNESS OF BREATH 12/20/21   Heilingoetter, Cassandra L, PA-C  aspirin  EC 81 MG tablet Take 81 mg by mouth at bedtime.    [provider]  atorvastatin (LIPITOR) 80 MG tablet Take 1 tablet (80 mg total) by mouth daily at 6 PM. 09/18/20   Corky Crafts, MD  budesonide-formoterol [facility]) 80-4.5 MCG/ACT inhaler Inhale 2 puffs into the lungs 2 (two) times daily. 07/22/22   [name], FNP  Cholecalciferol (VITAMIN D3 PO) Take 2 capsules by mouth daily. Patient not taking: Reported on 08/05/2022    [provider]  Cranberry 1000 MG CAPS Take 1,000 mg by mouth daily at 12 noon.    [provider]  cyclobenzaprine (FLEXERIL) 10 MG tablet Take 1 tablet (10 mg total) by mouth 3 (three) times daily as needed for muscle spasms. 07/22/22   [name], FNP  docusate sodium (COLACE) 100 MG capsule Take 100-200 mg by mouth See admin instructions. Take 100 mg in the morning and 200 mg at bedtime    [provider]  esomeprazole (NEXIUM) 20 MG capsule Take 1 capsule (20 mg total) by mouth 2 (two) times daily before a meal. Patient not taking: Reported on 07/31/2022 09/11/21   Imogene Burn, MD  estradiol (ESTRACE) 0.1 MG/GM vaginal cream Place 0.5 g vaginally once a week. Place 0.5g twice a week at opening of vagina 07/15/21   Leslye Peer, MD  ezetimibe (ZETIA) 10 MG tablet Take 1 tablet (10 mg total) by mouth daily. 07/31/21   Christell Constant, MD  ferrous sulfate 325 (65 FE) MG EC tablet Take 1 tablet (325 mg total) by mouth daily with breakfast. 07/03/22   Heilingoetter, Cassandra L, PA-C  hydrOXYzine (ATARAX) 10 MG tablet Take 1 tablet (10 mg total) by mouth 3 (three) times daily as needed. 11/26/21   [name], FNP  levothyroxine (SYNTHROID) 175 MCG tablet Take 175 mcg (1 tablet) by mouth daily Monday-Friday 08/28/22   Heilingoetter, Cassandra L, PA-C  lisinopril-hydrochlorothiazide (ZESTORETIC) 10-12.5 MG tablet TAKE 1 TABLET BY MOUTH EVERY DAY 08/26/22   [name], FNP  nitroGLYCERIN (NITROSTAT) 0.4 MG SL tablet Place  1 tablet (0.4 mg total) under the tongue every 5 (five) minutes x 3 doses as needed for chest pain. 07/22/22   [name], FNP  OXYGEN Inhale into the lungs.    [provider]  prochlorperazine (COMPAZINE) 10 MG tablet Take 1 tablet (10 mg total) by mouth every 6 (six) hours as needed for nausea or vomiting. 08/28/21   Heilingoetter, Cassandra L, PA-C  Trospium Chloride 60 MG CP24 Take 1 capsule (60 mg total) by mouth daily. 10/04/21   Marguerita Beards, MD     Objective    Physical Exam: Vitals:   09/07/22 0000 09/07/22 0053 09/07/22 0100 09/07/22 0200  BP: [name] Kitchen)  135/57  (!) 152/63 (!) 125/43  Pulse: 75  74 73  Resp: (!) 21  (!) 21 20  Temp:  (!) 97.3 F (36.3 C)    TempSrc:  Oral    SpO2: 96%  97% 97%    General: appears to be stated age; alert, oriented Skin: warm, dry, no rash Head:  AT/[place] Mouth:  Oral mucosa membranes appear dry, normal dentition Neck: supple; trachea midline Heart:  RRR; did not appreciate any M/R/G Lungs: CTAB, did not appreciate any wheezes, rales, or rhonchi Abdomen: + BS; soft, ND, mild flank tenderness on left, in the absence of associated guarding, rigidity, or rebound tenderness Vascular: 2+ pedal pulses b/l; 2+ radial pulses b/l Extremities: no peripheral edema, no muscle wasting Neuro: strength and sensation intact in upper and lower extremities b/l     Labs on Admission: I have personally reviewed following labs and imaging studies  CBC: Recent Labs  Lab 09/06/22 2141  WBC 6.2  NEUTROABS 4.8  HGB 8.9*  HCT 29.6*  MCV 94.3  PLT 286   Basic Metabolic Panel: Recent Labs  Lab 09/06/22 2141  NA 136  K 4.7  CL 102  CO2 24  GLUCOSE 110*  BUN 47*  CREATININE 1.62*  CALCIUM 8.8*   GFR: Estimated Creatinine Clearance: 24.2 mL/min (A) (by C-G formula based on SCr of 1.62 mg/dL (H)). Liver Function Tests: No results for input(s): "AST", "ALT", "ALKPHOS", "BILITOT", "PROT", "ALBUMIN" in the last 168 hours. No results  for input(s): "LIPASE", "AMYLASE" in the last 168 hours. No results for input(s): "AMMONIA" in the last 168 hours. Coagulation Profile: No results for input(s): "INR", "PROTIME" in the last 168 hours. Cardiac Enzymes: No results for input(s): "CKTOTAL", "CKMB", "CKMBINDEX", "TROPONINI" in the last 168 hours. BNP (last 3 results) No results for input(s): "PROBNP" in the last 8760 hours. HbA1C: No results for input(s): "HGBA1C" in the last 72 hours. CBG: No results for input(s): "GLUCAP" in the last 168 hours. Lipid Profile: No results for input(s): "CHOL", "HDL", "LDLCALC", "TRIG", "CHOLHDL", "LDLDIRECT" in the last 72 hours. Thyroid Function Tests: No results for input(s): "TSH", "T4TOTAL", "FREET4", "T3FREE", "THYROIDAB" in the last 72 hours. Anemia Panel: No results for input(s): "VITAMINB12", "FOLATE", "FERRITIN", "TIBC", "IRON", "RETICCTPCT" in the last 72 hours. Urine analysis:    Component Value Date/Time   COLORURINE RED (A) 09/06/2022 2151   APPEARANCEUR CLOUDY (A) 09/06/2022 2151   LABSPEC 1.011 09/06/2022 2151   PHURINE 7.0 09/06/2022 2151   GLUCOSEU NEGATIVE 09/06/2022 2151   HGBUR LARGE (A) 09/06/2022 2151   BILIRUBINUR NEGATIVE 09/06/2022 2151   BILIRUBINUR Negative 07/22/2022 1500   KETONESUR NEGATIVE 09/06/2022 2151   PROTEINUR 100 (A) 09/06/2022 2151   UROBILINOGEN 0.2 07/22/2022 1500   UROBILINOGEN 0.2 04/05/2012 1606   NITRITE NEGATIVE 09/06/2022 2151   LEUKOCYTESUR LARGE (A) 09/06/2022 2151    Radiological Exams on Admission: CT Renal Stone Study  Result Date: 09/06/2022 CLINICAL DATA:  Abdominal/flank pain, stone suspected EXAM: CT ABDOMEN AND PELVIS WITHOUT CONTRAST TECHNIQUE: Multidetector CT imaging of the abdomen and pelvis was performed following the standard protocol without IV contrast. RADIATION DOSE REDUCTION: This exam was performed according to the departmental dose-optimization program which includes automated exposure control, adjustment of the  mA and/or kV according to patient size and/or use of iterative reconstruction technique. COMPARISON:  Head CT 07/09/2021 FINDINGS: Lower chest: No acute abnormality. Moderate volume hiatal hernia partially visualized. Elevated left hemidiaphragm. Hepatobiliary: No focal liver abnormality. No gallstones, gallbladder wall thickening, or  pericholecystic fluid. No biliary dilatation. Pancreas: No focal lesion. Normal pancreatic contour. No surrounding inflammatory changes. No main pancreatic ductal dilatation. Spleen: Normal in size without focal abnormality. Adrenals/Urinary Tract: No adrenal nodule bilaterally. Multiple large left renal stones measuring up to 2.3 cm. Associated mild left hydronephrosis. No left hydroureter. Query mild left urothelial thickening. A 2.3 cm fluid density lesion within left kidney likely represents a simple renal cyst. Simple renal cysts, in the absence of clinically indicated signs/symptoms, require no independent follow-up. A 2.1 cm hyperdense renal lesion with a density of 54 Hounsfield units. No right nephrolithiasis. No ureterolithiasis bilaterally. No right hydroureteronephrosis. The urinary bladder is unremarkable. Stomach/Bowel: Stomach is within normal limits. No evidence of bowel wall thickening or dilatation. Appendix appears normal. Vascular/Lymphatic: No abdominal aorta or iliac aneurysm. Severe atherosclerotic plaque of the aorta and its branches. No abdominal, pelvic, or inguinal lymphadenopathy. Reproductive: Uterus and bilateral adnexa are unremarkable. Other: No intraperitoneal free fluid. No intraperitoneal free gas. No organized fluid collection. Musculoskeletal: Tiny fat containing umbilical hernia. No suspicious lytic or blastic osseous lesions. No acute displaced fracture. Multilevel severe degenerative changes of the spine. Associated moderate to severe multilevel osseous neural foraminal stenosis. IMPRESSION: 1. Obstructive multiple large left nephrolithiasis  measuring up to 2.3 cm. Associated mild hydronephrosis. Correlate with urinalysis to exclude superimposed infection. Recommend urologic consultation. 2. Indeterminate 2.1 cm left renal hypodense lesion-recommend further evaluation with MRI renal protocol. When the patient is clinically stable and able to follow directions and hold their breath (preferably as an outpatient) further evaluation with dedicated MRI renal protocol should be considered. 3. Moderate volume hiatal hernia partially visualized. 4. Colonic diverticulosis with no acute diverticulitis. 5.  Aortic Atherosclerosis (ICD10-I70.0). Electronically Signed   By: Tish Frederickson M.D.   On: 09/06/2022 23:59      Assessment/Plan    Principal Problem:   Left ureteral stone Active Problems:   UTI (urinary tract infection)   Hyperlipidemia   Acquired hypothyroidism   Stage 3b chronic kidney disease (CKD) (HCC)   Essential hypertension   COPD (chronic obstructive pulmonary disease) (HCC)   Chronic iron deficiency anemia   Gross hematuria   Chronic hypoxic respiratory failure (HCC)   Chronic diastolic CHF (congestive heart failure) (HCC)     #) Obstructing left kidney stones: In the context of 3 to 4 days of progressive left flank discomfort associated with new onset gross hematuria, CT abdomen/pelvis shows evidence of multiple obstructive large left kidney stones, measuring up to 2.3 cm, associated with left-sided hydronephrosis.  Additionally, patient updated by suggestion of urinary tract infection, requiring source control, as further detailed below.  EDP discussed patient's case/surgery with the on-call urologist, Dr. Marlou Porch, will formally consult and evaluate the patient in the morning, requesting interval n.p.o., as above.  Chales Abrahams Score for this patient in the context of anticipated aforementioned surgery conveys a   0.57% perioperative risk for significant cardiac event. No evidence to suggest acutely decompensated heart failure  or acute MI. Consequently, no absolute contraindications to proceeding with proposed surgery at this time.  Plan: Urology to consult, as above.  NPO.  Check preoperative EKG, INR.  Continuous lactated Ringer's at 100 cc/h.  Monitor strict I's and O's and daily weights.  Repeat CMP, CBC in the morning.  Type and screen ordered.  Prn IV fentanyl, Zofran.  Further evaluation and management of suspected urinary tract infection, as further detailed below.  Prn IV fentanyl, Zofran.           #)  Urinary tract infection: Consequence of evidence of obstructing left kidney stones, presentation appears to be associated with urinary tract infection, in context of the patient's 3 to 4 days of new onset left flank discomfort associate with gross hematuria, with urinalysis notable for significant pyuria, large leukocyte esterase, and a cloudy appearing specimen that is not associate with any squamous epithelial cells to suggest a contaminated specimen.  Require source control in the context of obstructing left kidney stones, for which urology has been consulted and will evaluate the patient in the morning.  Urine culture collected prior to initiation of Rocephin, which will be continued for now.  Of note, in the absence of objective fever and in the absence of leukocytosis, SIRS criteria not met for sepsis at this time.  Plan: Continuous IV fluids, as above.  Follow result of urine culture.  Continue Rocephin.  Urology consulted for source control in the setting of obstructing left kidney stones, as above.  INR.  Repeat CMP, CBC with differential in the morning.           #) COPD: Documented history thereof, without clinical evidence of acute exacerbation at this time.   Outpatient respiratory regimen includes the following: Symbicort and as needed albuterol inhaler.   Plan: cont outpatient Symbicort. Prn albuterol nebulizer. Check CMP and serum magnesium level in the AM.            #)  Chronic iron deficiency: Documented history of such, a/w with baseline hgb range 8-10, with presenting hgb consistent with this range, will noting that the patient is on daily oral iron segmentation at home.  Presentation appears to be associated with a degree of active bleed in the form of 3 to 4 days of gross hematuria, with contributory influences from multiple obstructing right kidney stones, as further detailed above.   Plan: Repeat CBC in the morning.  Check INR.  Add on iron studies.  Type and screen ordered.  Further evaluation management of presenting obstructing left-sided kidney stones, including urology consultation, as above.             #) CKD Stage 3B: Documented history of such, with baseline creatinine 1.3-1.6, with presenting creatinine consistent with this baseline.  Will closely monitor ensuing renal function trend, particularly context of evidence of obstructing left-sided kidney stones, as above.    Plan: Monitor strict I's and O's and daily weights.  Attempt to avoid nephrotoxic agents.  Hold home lisinopril for now.  CMP/magnesium level in the AM.  Further evaluation management of obstructing left kidney stones, as above.  Continuous LR for now, as above.             #) Essential Hypertension: documented h/o such, with outpatient antihypertensive regimen including lisinopril and HCTZ, with the latter notable in the context of presenting multiple left-sided kidney stones.  SBP's in the ED today: 120s to 150s mmHg.   Plan: Close monitoring of subsequent BP via routine VS. in the setting of current n.p.o. status, will hold home antihypertensive medications for now.              #) acquired hypothyroidism: documented h/o such, on Synthroid as outpatient.   Plan: In the setting of current n.p.o. status, will hold home Synthroid for now.               #) Hyperlipidemia: documented h/o such. On rosuvastatin as outpatient.   Plan: Hold  home statin for now in the setting of current n.p.o. status.                #)  Chronic diastolic heart failure: documented history of such, with most recent echocardiogram performed in May 2023 notable for LVEF 65 to 70%, grade 1 diastolic dysfunction. No clinical evidence to suggest acutely decompensated heart failure at this time.  Not on any scheduled home diuretic medications as an outpatient aside from aforementioned HCTZ for antihypertensive purposes.    Plan: monitor strict I's & O's and daily weights. Repeat CMP in AM. Check serum mag level.        DVT prophylaxis: SCD's   Code Status: Full code Family Communication: none Disposition Plan: Per Rounding Team Consults called: EDP discussed patient's case with the on-call neurologist, Dr. Marlou Porch, Who will formally consult and see the patient in the morning, as further detailed above;  Admission status: Inpatient    I SPENT GREATER THAN 75  MINUTES IN CLINICAL CARE TIME/MEDICAL DECISION-MAKING IN COMPLETING THIS ADMISSION.     Chaney Born Ashtyn Meland DO [facility] From 7PM - 7AM   09/07/2022, 2:37 AM

## 2022-09-07 NOTE — Consult Note (Signed)
I have been asked to see the patient by Dr. Wynona Dove, for evaluation and management of right staghorn calculi.  History of present illness: 87 year old female presented to the emergency department with burning intermittent right lower quadrant pain and gross hematuria.  The patient has a past medical history significant for lung cancer and underwent chemo and radiation.  She is currently receiving immunotherapy, has a appointment in the coming weeks for her last round.  Since starting the treatment for her lung she has had some chronic fatigue.   For the last several years the patient is complaining of some intermittent right lower pelvic pain.  She describes it as a burning pain.  It seems to be worse at night.  She denies any real dysuria.  She denies any fevers or chills.  She has had gross hematuria over the last few days.  She was treated for a lower urinary tract infection about a month ago.  The patient had a CT scan while in the emergency department which demonstrated a large right staghorn stone.  The stone was present on previous imaging, looking back through all her images, it appears to have developed between 2020 and 2021.  She denies any left-sided flank pain.  The patient's past medical history is significant for COPD on home O2, 3 L.  She also has diastolic congestive heart failure and chronic anemia.  In addition the patient has a past surgical history significant for pelvic organ prolapse surgery and surgery for stress urinary incontinence.  This was in 2022.   Review of systems: A 12 point comprehensive review of systems was obtained and is negative unless otherwise stated in the history of present illness.  Patient Active Problem List   Diagnosis Date Noted   Left ureteral stone 09/07/2022   Gross hematuria 09/07/2022   Chronic hypoxic respiratory failure (HCC) 09/07/2022   Chronic diastolic CHF (congestive heart failure) ([place]) 09/07/2022   Nephrolithiasis 09/07/2022    Chronic iron deficiency anemia 04/21/2022   Anemia 02/13/2022   Encounter for antineoplastic immunotherapy 11/20/2021   COPD (chronic obstructive pulmonary disease) ([place]) 08/20/2021   Dysphagia 08/20/2021   History of lung cancer 07/31/2021   Malignant neoplasm of unspecified part of unspecified bronchus or lung ([place]) 07/25/2021   Encounter for antineoplastic chemotherapy 07/25/2021   Mediastinal adenopathy 07/15/2021   Pulmonary nodule 1 cm or greater in diameter 07/02/2021   Coronary artery disease 04/11/2021   Right bundle branch block 04/11/2021   Newly recognized heart murmur 04/11/2021   Occult blood positive stool 11/09/2020   Chest pain with moderate risk for cardiac etiology 12/01/2019   Urinary urgency 12/01/2019   Anxiety 02/09/2019   Prediabetes 12/29/2018   Essential hypertension 08/27/2018   Vitamin D deficiency 06/25/2018   Hyperlipidemia 06/25/2018   Acquired hypothyroidism 06/25/2018   Benign hypertension with CKD (chronic kidney disease) stage III ([place]) 06/25/2018   Stage 3b chronic kidney disease (CKD) ([place]) 06/25/2018   OAB (overactive bladder) 08/26/2016   CAP (community acquired pneumonia) 04/12/2012   Bacteremia 04/08/2012   UTI (urinary tract infection) 04/05/2012   LLQ abdominal pain 04/05/2012   Hyponatremia 04/05/2012    No current facility-administered medications on file prior to encounter.   Current Outpatient Medications on File Prior to Encounter  Medication Sig Dispense Refill   acetaminophen (TYLENOL) 325 MG tablet Take 2 tablets (650 mg total) by mouth every 6 (six) hours as needed for mild pain (or Fever >/= 101).     albuterol (VENTOLIN HFA) 108 (  90 Base) MCG/ACT inhaler TAKE 2 PUFFS BY MOUTH EVERY 6 HOURS AS NEEDED FOR WHEEZE OR SHORTNESS OF BREATH 8.5 each 1   aspirin EC 81 MG tablet Take 81 mg by mouth at bedtime.     atorvastatin (LIPITOR) 80 MG tablet Take 1 tablet (80 mg total) by mouth daily at 6 PM. 90 tablet 3    budesonide-formoterol (SYMBICORT) 80-4.5 MCG/ACT inhaler Inhale 2 puffs into the lungs 2 (two) times daily. 3 each 1   Dextromethorphan-guaiFENesin (DELSYM CGH/CHEST CONG DM CHILD) 5-100 MG/5ML LIQD Take 30 mLs by mouth every 6 (six) hours as needed (cough).     docusate sodium (COLACE) 100 MG capsule Take 100-200 mg by mouth See admin instructions. Take 100 mg in the morning and 200 mg at bedtime     ferrous sulfate 325 (65 FE) MG EC tablet Take 1 tablet (325 mg total) by mouth daily with breakfast. 30 tablet 2   hydrOXYzine (ATARAX) 10 MG tablet Take 1 tablet (10 mg total) by mouth 3 (three) times daily as needed. (Patient taking differently: Take 10 mg by mouth 3 (three) times daily as needed for anxiety.) 30 tablet 0   levothyroxine (SYNTHROID) 175 MCG tablet Take 175 mcg (1 tablet) by mouth daily Monday-Friday 30 tablet 2   lisinopril-hydrochlorothiazide (ZESTORETIC) 10-12.5 MG tablet TAKE 1 TABLET BY MOUTH EVERY DAY (Patient taking differently: Take 0.5 tablets by mouth daily.) 36 tablet 0   nitroGLYCERIN (NITROSTAT) 0.4 MG SL tablet Place 1 tablet (0.4 mg total) under the tongue every 5 (five) minutes x 3 doses as needed for chest pain. 25 tablet 3   prochlorperazine (COMPAZINE) 10 MG tablet Take 1 tablet (10 mg total) by mouth every 6 (six) hours as needed for nausea or vomiting. 30 tablet 2   Throat Lozenges (LOZENGES MT) Use as directed 1 lozenge in the mouth or throat every 4 (four) hours as needed (cough).     Trospium Chloride 60 MG CP24 Take 1 capsule (60 mg total) by mouth daily. 30 capsule 11   cyclobenzaprine (FLEXERIL) 10 MG tablet Take 1 tablet (10 mg total) by mouth 3 (three) times daily as needed for muscle spasms. 30 tablet 1   esomeprazole (NEXIUM) 20 MG capsule Take 1 capsule (20 mg total) by mouth 2 (two) times daily before a meal. (Patient not taking: Reported on 07/31/2022) 60 capsule 3   estradiol (ESTRACE) 0.1 MG/GM vaginal cream Place 0.5 g vaginally once a week. Place 0.5g  twice a week at opening of vagina (Patient not taking: Reported on 09/07/2022)     ezetimibe (ZETIA) 10 MG tablet Take 1 tablet (10 mg total) by mouth daily. (Patient not taking: Reported on 09/07/2022) 90 tablet 3   OXYGEN Inhale into the lungs.      Past Medical History:  Diagnosis Date   Anemia    Arthritis    knees, back   Carotid artery occlusion    Chronic kidney disease    stage 3 ckd no nephrologist   Complete uterine prolapse with prolapse of anterior vaginal wall    Complication of anesthesia    hard to wake up per pt   Constipation    Coronary artery disease    Diverticulitis yrs ago coialitis   Dyspnea    History of blood transfusion    History of radiation therapy    right lung 08/05/2021-09/18/2021  Dr Antony Blackbird   Hypertension    Hypothyroid    Lung cancer (HCC)    Numbness  in hands at times   Pneumonia    Pre-diabetes    Scoliosis    STEMI (ST elevation myocardial infarction) (HCC) 10/26/2019   DES RCA   Wears dentures    full dentures   Wears glasses    for reading    Past Surgical History:  Procedure Laterality Date   ANTERIOR AND POSTERIOR REPAIR WITH SACROSPINOUS FIXATION N/A 12/20/2020   Procedure: SACROSPINOUS LIGAMENT FIXATION;  Surgeon: Marguerita Beards, MD;  Location: [facility];  Service: Gynecology;  Laterality: N/A;  Total time requested for all procedures is 2 hours   BACK SURGERY  1980 age 37   lower   bartholin cyst removal  age 16's   BLADDER SUSPENSION N/A 12/20/2020   Procedure: TRANSVAGINAL TAPE (TVT) PROCEDURE;  Surgeon: Marguerita Beards, MD;  Location: [facility];  Service: Gynecology;  Laterality: N/A;   BRONCHIAL BRUSHINGS  07/15/2021   Procedure: BRONCHIAL BRUSHINGS;  Surgeon: Leslye Peer, MD;  Location: [facility] ENDOSCOPY;  Service: Pulmonary;;   BRONCHIAL NEEDLE ASPIRATION BIOPSY  07/15/2021   Procedure: BRONCHIAL NEEDLE ASPIRATION BIOPSIES;  Surgeon: Leslye Peer, MD;  Location:  [facility] ENDOSCOPY;  Service: Pulmonary;;   CORONARY/GRAFT ACUTE MI REVASCULARIZATION N/A 10/26/2019   Procedure: Coronary/Graft Acute MI Revascularization;  Surgeon: Tonny Bollman, MD;  Location: [facility] INVASIVE CV LAB;  Service: Cardiovascular;  Laterality: N/A;   CYSTOCELE REPAIR N/A 12/20/2020   Procedure: ANTERIOR AND POSTERIOR REPAIR WITH PERINEORRHAPHY;  Surgeon: Marguerita Beards, MD;  Location: [facility];  Service: Gynecology;  Laterality: N/A;   CYSTOSCOPY N/A 12/20/2020   Procedure: CYSTOSCOPY;  Surgeon: Marguerita Beards, MD;  Location: [facility];  Service: Gynecology;  Laterality: N/A;   ELBOW SURGERY  1990's   left   ENDOBRONCHIAL ULTRASOUND N/A 07/15/2021   Procedure: ENDOBRONCHIAL ULTRASOUND;  Surgeon: Leslye Peer, MD;  Location: [facility] ENDOSCOPY;  Service: Pulmonary;  Laterality: N/A;   HEMOSTASIS CONTROL  07/15/2021   Procedure: HEMOSTASIS CONTROL;  Surgeon: Leslye Peer, MD;  Location: [facility] ENDOSCOPY;  Service: Pulmonary;;   LEFT HEART CATH AND CORONARY ANGIOGRAPHY N/A 10/26/2019   Procedure: LEFT HEART CATH AND CORONARY ANGIOGRAPHY;  Surgeon: Tonny Bollman, MD;  Location: [facility] INVASIVE CV LAB;  Service: Cardiovascular;  Laterality: N/A;   TONSILLECTOMY      Social History   Tobacco Use   Smoking status: Former    Packs/day: 1.50    Years: 35.00    Total pack years: 52.50    Types: Cigarettes    Quit date: 04/05/1986    Years since quitting: 36.4   Smokeless tobacco: Never  Vaping Use   Vaping Use: Never used  Substance Use Topics   Alcohol use: No   Drug use: No    Family History  Problem Relation Age of Onset   Hypertension Mother    Colon cancer Neg Hx    Stomach cancer Neg Hx    Esophageal cancer Neg Hx     PE: Vitals:   09/07/22 0200 09/07/22 0400 09/07/22 0454 09/07/22 0500  BP: (!) 125/43 (!) 122/51  132/67  Pulse: 73 72  75  Resp: 20 20  (!) 21  Temp:   98.8 F (37.1 C)   TempSrc:   Oral   SpO2: 97% 96%   96%   Patient appears to be in no acute distress  patient is alert and oriented x3 Atraumatic normocephalic head No cervical or supraclavicular lymphadenopathy appreciated No increased work of  breathing, no audible wheezes/rhonchi Regular sinus rhythm/rate Abdomen is soft, nontender, nondistended, no CVA or suprapubic tenderness Mild right lower quadrant pain. Lower extremities are symmetric without appreciable edema Grossly neurologically intact No identifiable skin lesions  Recent Labs    09/06/22 2141 09/07/22 0415  WBC 6.2 5.8  HGB 8.9* 8.3*  HCT 29.6* 27.6*   Recent Labs    09/06/22 2141 09/07/22 0415  NA 136 134*  K 4.7 4.5  CL 102 104  CO2 24 24  GLUCOSE 110* 115*  BUN 47* 45*  CREATININE 1.62* 1.45*  CALCIUM 8.8* 8.6*   Recent Labs    09/07/22 0415  INR 1.1   No results for input(s): "LABURIN" in the last 72 hours. Results for orders placed or performed in visit on 07/12/21  SARS Coronavirus 2 (TAT 6-24 hrs)     Status: None   Collection Time: 07/12/21 12:00 AM  Result Value Ref Range Status   SARS Coronavirus 2 RESULT: NEGATIVE  Final    Comment: RESULT: NEGATIVESARS-CoV-2 INTERPRETATION:A NEGATIVE  test result means that SARS-CoV-2 RNA was not present in the specimen above the limit of detection of this test. This does not preclude a possible SARS-CoV-2 infection and should not be used as the  sole basis for patient management decisions. Negative results must be combined with clinical observations, patient history, and epidemiological information. Optimum specimen types and timing for peak viral levels during infections caused by SARS-CoV-2  have not been determined. Collection of multiple specimens or types of specimens may be necessary to detect virus. Improper specimen collection and handling, sequence variability under primers/probes, or organism present below the limit of detection may  lead to false negative results. Positive and negative predictive  values of testing are highly dependent on prevalence. False negative test results are more likely when prevalence of disease is high.The expected result is NEGATIVE.Fact S heet for  Healthcare Providers: [employer] Sheet for Patients: https://poole-fre[place].org/ Reference Range - Negative     Imaging: I reviewed the patient's CT scan performed in the emergency department today, abdomen pelvis.  With the above findings.  I also reviewed the patient's chest x-rays and CT scans from the last several years.  The findings are as noted in the HPI.  Imp: The patient has a right-sided staghorn stone which may be the etiology of her gross hematuria.  Her CAT scan is otherwise largely unremarkable.  She does have a hyperdense cyst which is unchanged and of no real clinical significance.  She may have a urinary tract infection as well, this may be contributing to her gross hematuria.  However, I think that the staghorn stone is chronic and not contributing to pain and at this point she is more or less asymptomatic from that.  Her right lower quadrant pain may be associated with her pelvic organ prolapse surgery.  Recommendations: Would recommend that the patient be discharged home with broad-spectrum antibiotics to cover typical UTI pathogens.  I would recommend a 7-day course.  Will then schedule her for follow-up in our clinic for further discussion of management of her staghorn stone and to complete her hematuria evaluation.  Crist Fat

## 2022-09-07 NOTE — Care Management Obs Status (Signed)
MEDICARE OBSERVATION STATUS NOTIFICATION   Patient Details  Name: Abigail [name] MRN: [id number] Date of Birth: 12/12/1935   Medicare Observation Status Notification Given:  Yes    Kendahl Bumgardner C Tarpley-Carter, LCSWA 09/07/2022, 9:45 AM

## 2022-09-08 ENCOUNTER — Telehealth: Payer: Self-pay

## 2022-09-08 NOTE — Telephone Encounter (Signed)
Transition Care Management Follow-up Telephone Call Date of discharge and from where: 09/07/2022  How have you been since you were released from the hospital? Pt states she feels okay.  Any questions or concerns? No  Items Reviewed: Did the pt receive and understand the discharge instructions provided? Yes  Medications obtained and verified? Yes  Other? Yes  Any new allergies since your discharge? No  Dietary orders reviewed? Yes Do you have support at home? Yes   Home Care and Equipment/Supplies: Were home health services ordered? no If so, what is the name of the agency? N/s  Has the agency set up a time to come to the patient's home? no Were any new equipment or medical supplies ordered?  No What is the name of the medical supply agency? N/s Were you able to get the supplies/equipment? no Do you have any questions related to the use of the equipment or supplies? No  Functional Questionnaire: (I = Independent and D = Dependent) ADLs: i  Bathing/Dressing- i  Meal Prep- i  Eating- i  Maintaining continence- i  Transferring/Ambulation- i  Managing Meds- i  Follow up appointments reviewed:  PCP Hospital f/u appt confirmed? Yes  Scheduled to see JM on 09/11/2022 @ [facility]. Specialist Hospital f/u appt confirmed? No  Scheduled to see n/a on n/a @ n/a. Are transportation arrangements needed? No  If their condition worsens, is the pt aware to call PCP or go to the Emergency Dept.? Yes Was the patient provided with contact information for the PCP's office or ED? Yes Was to pt encouraged to call back with questions or concerns? Yes

## 2022-09-09 LAB — URINE CULTURE: Culture: >=100000 — AB

## 2022-09-10 ENCOUNTER — Telehealth (HOSPITAL_BASED_OUTPATIENT_CLINIC_OR_DEPARTMENT_OTHER): Payer: Self-pay | Admitting: Emergency Medicine

## 2022-09-10 NOTE — Telephone Encounter (Signed)
Post ED Visit - Positive Culture Follow-up  Culture report reviewed by antimicrobial stewardship pharmacist: [place] Team []  Elenor Quinones, Pharm.D. [x]  Heide Guile, Pharm.D., BCPS AQ-ID []  Parks Neptune, Pharm.D., BCPS []  Alycia Rossetti, Pharm.D., BCPS []  [place], Pharm.D., BCPS, AAHIVP []  Legrand Como, Pharm.D., BCPS, AAHIVP []  Salome Arnt, PharmD, BCPS []  Johnnette Gourd, PharmD, BCPS []  Hughes Better, PharmD, BCPS []  Leeroy Cha, PharmD []  Laqueta Linden, PharmD, BCPS []  Albertina Parr, PharmD  [place] Team []  Leodis Sias, PharmD []  Lindell Spar, PharmD []  Royetta Asal, PharmD []  Graylin Shiver, Rph []  Rema Fendt) Glennon Mac, PharmD []  Arlyn Dunning, PharmD []  Netta Cedars, PharmD []  Dia Sitter, PharmD []  Leone Haven, PharmD []  Gretta Arab, PharmD []  Theodis Shove, PharmD []  Peggyann Juba, PharmD []  Reuel Boom, PharmD   Positive urine culture Treated with cefpodoxime, organism sensitive to the same and no further patient follow-up is required at this time.  Hazle Nordmann 09/10/2022, 9:06 AM

## 2022-09-11 ENCOUNTER — Encounter: Payer: Self-pay | Admitting: Nurse Practitioner

## 2022-09-11 ENCOUNTER — Ambulatory Visit (INDEPENDENT_AMBULATORY_CARE_PROVIDER_SITE_OTHER): Payer: PPO | Admitting: Nurse Practitioner

## 2022-09-11 VITALS — BP 110/58 | HR 94 | Temp 97.5°F | Ht 66.0 in | Wt 140.0 lb

## 2022-09-11 DIAGNOSIS — N3001 Acute cystitis with hematuria: Secondary | ICD-10-CM

## 2022-09-11 DIAGNOSIS — N2889 Other specified disorders of kidney and ureter: Secondary | ICD-10-CM

## 2022-09-11 DIAGNOSIS — I25118 Atherosclerotic heart disease of native coronary artery with other forms of angina pectoris: Secondary | ICD-10-CM | POA: Diagnosis not present

## 2022-09-11 NOTE — Patient Instructions (Signed)

## 2022-09-11 NOTE — Progress Notes (Signed)
I,Tianna Badgett,acting as a Neurosurgeon for [employer], FNP.,have documented all relevant documentation on the behalf of [name], FNP,as directed by  [name], FNP while in the presence of [name], FNP.  Subjective:     Patient ID: Abigail [name] , female    DOB: 02/04/1936 , 87 y.o.   MRN: [id number]   Chief Complaint  Patient presents with   Hospitalization Follow-up    HPI  Patient presents today for HFU. Since going to the ER she is increasing her energy level. Her daily routine has improved. When she was urinating the urine would stay on top of the pad and was dark when she talked with Lawanna Kobus (Nurse Case Manager). Then on Saturday she started seeing clots of blood and went to ER. When she got to the ER she had worse hematuria. She is now better since taking the medication. She started her antibiotic on Monday oral. She has received a call from the Urologist and her appt set but unsure of her date. She has a kidney stone that is large and has multiple stones. She eats wheat thins with peanut butter. She also drinks ensure daily. February 1st is her last immunotherapy.  She is here with Arita Miss     Past Medical History:  Diagnosis Date   Anemia    Arthritis    knees, back   Carotid artery occlusion    Chronic kidney disease    stage 3 ckd no nephrologist   Complete uterine prolapse with prolapse of anterior vaginal wall    Complication of anesthesia    hard to wake up per pt   Constipation    Coronary artery disease    Diverticulitis yrs ago coialitis   Dyspnea    History of blood transfusion    History of radiation therapy    right lung 08/05/2021-09/18/2021  Dr Antony Blackbird   Hypertension    Hypothyroid    Lung cancer (HCC)    Numbness    in hands at times   Pneumonia    Pre-diabetes    Scoliosis    STEMI (ST elevation myocardial infarction) (HCC) 10/26/2019   DES RCA   Wears dentures    full dentures   Wears glasses    for reading     Family  History  Problem Relation Age of Onset   Hypertension Mother    Colon cancer Neg Hx    Stomach cancer Neg Hx    Esophageal cancer Neg Hx      Current Outpatient Medications:    acetaminophen (TYLENOL) 325 MG tablet, Take 2 tablets (650 mg total) by mouth every 6 (six) hours as needed for mild pain (or Fever >/= 101)., Disp: , Rfl:    albuterol (VENTOLIN HFA) 108 (90 Base) MCG/ACT inhaler, TAKE 2 PUFFS BY MOUTH EVERY 6 HOURS AS NEEDED FOR WHEEZE OR SHORTNESS OF BREATH, Disp: 8.5 each, Rfl: 1   aspirin EC 81 MG tablet, Take 81 mg by mouth at bedtime., Disp: , Rfl:    atorvastatin (LIPITOR) 80 MG tablet, Take 1 tablet (80 mg total) by mouth daily at 6 PM., Disp: 90 tablet, Rfl: 3   budesonide-formoterol (SYMBICORT) 80-4.5 MCG/ACT inhaler, Inhale 2 puffs into the lungs 2 (two) times daily., Disp: 3 each, Rfl: 1   cefpodoxime (VANTIN) 200 MG tablet, Take 1 tablet (200 mg total) by mouth daily for 10 days., Disp: 10 tablet, Rfl: 0   cyclobenzaprine (FLEXERIL) 10 MG tablet, Take 1 tablet (10 mg  total) by mouth 3 (three) times daily as needed for muscle spasms., Disp: 30 tablet, Rfl: 1   Dextromethorphan-guaiFENesin (DELSYM CGH/CHEST CONG DM CHILD) 5-100 MG/5ML LIQD, Take 30 mLs by mouth every 6 (six) hours as needed (cough)., Disp: , Rfl:    docusate sodium (COLACE) 100 MG capsule, Take 100-200 mg by mouth See admin instructions. Take 100 mg in the morning and 200 mg at bedtime, Disp: , Rfl:    esomeprazole (NEXIUM) 20 MG capsule, Take 1 capsule (20 mg total) by mouth 2 (two) times daily before a meal. (Patient not taking: Reported on 07/31/2022), Disp: 60 capsule, Rfl: 3   estradiol (ESTRACE) 0.1 MG/GM vaginal cream, Place 0.5 g vaginally once a week. Place 0.5g twice a week at opening of vagina (Patient not taking: Reported on 09/07/2022), Disp: , Rfl:    ezetimibe (ZETIA) 10 MG tablet, Take 1 tablet (10 mg total) by mouth daily. (Patient not taking: Reported on 09/07/2022), Disp: 90 tablet, Rfl: 3    ferrous sulfate 325 (65 FE) MG EC tablet, Take 1 tablet (325 mg total) by mouth daily with breakfast., Disp: 30 tablet, Rfl: 2   hydrOXYzine (ATARAX) 10 MG tablet, Take 1 tablet (10 mg total) by mouth 3 (three) times daily as needed. (Patient taking differently: Take 10 mg by mouth 3 (three) times daily as needed for anxiety.), Disp: 30 tablet, Rfl: 0   levothyroxine (SYNTHROID) 175 MCG tablet, Take 175 mcg (1 tablet) by mouth daily Monday-Friday, Disp: 30 tablet, Rfl: 2   nitroGLYCERIN (NITROSTAT) 0.4 MG SL tablet, Place 1 tablet (0.4 mg total) under the tongue every 5 (five) minutes x 3 doses as needed for chest pain., Disp: 25 tablet, Rfl: 3   OXYGEN, Inhale into the lungs., Disp: , Rfl:    prochlorperazine (COMPAZINE) 10 MG tablet, Take 1 tablet (10 mg total) by mouth every 6 (six) hours as needed for nausea or vomiting., Disp: 30 tablet, Rfl: 2   Throat Lozenges (LOZENGES MT), Use as directed 1 lozenge in the mouth or throat every 4 (four) hours as needed (cough)., Disp: , Rfl:    Trospium Chloride 60 MG CP24, Take 1 capsule (60 mg total) by mouth daily., Disp: 30 capsule, Rfl: 11   Allergies  Allergen Reactions   Codeine Nausea And Vomiting   Norvasc [Amlodipine] Rash and Other (See Comments)    rash     Review of Systems  Constitutional: Negative.   Respiratory: Negative.    Cardiovascular: Negative.   Gastrointestinal: Negative.   Neurological: Negative.      Today's Vitals   09/11/22 1100  BP: (!) 110/58  Pulse: 94  Temp: (!) 97.5 F (36.4 C)  TempSrc: Oral  Weight: 140 lb (63.5 kg)  Height: 5\' 6"  (1.676 m)   Body mass index is 22.6 kg/m.   Objective:  Physical Exam Vitals reviewed.  Constitutional:      General: She is not in acute distress.    Appearance: Normal appearance.  Cardiovascular:     Rate and Rhythm: Normal rate and regular rhythm.     Pulses: Normal pulses.     Heart sounds: Normal heart sounds. No murmur heard. Pulmonary:     Effort: Pulmonary  effort is normal. No respiratory distress.     Breath sounds: Normal breath sounds. No wheezing.     Comments: Wearing supplemental oxygen Abdominal:     Tenderness: There is no right CVA tenderness or left CVA tenderness.  Skin:    General: Skin is  warm and dry.     Capillary Refill: Capillary refill takes less than 2 seconds.  Neurological:     General: No focal deficit present.     Mental Status: She is alert and oriented to person, place, and time.     Cranial Nerves: No cranial nerve deficit.     Motor: No weakness.  Psychiatric:        Mood and Affect: Mood normal.        Behavior: Behavior normal.        Thought Content: Thought content normal.        Judgment: Judgment normal.         Assessment And Plan:     1. Nodule of kidney Comments: During her ER visit she had a finding of a kidney nodule that needs to be further evaluated with an MRI. - MR Abdomen W Wo Contrast; Future  2. Acute cystitis with hematuria Comments: She is to return after patient of her antibiotics for a urinalysis repeat and culture  3. Coronary artery disease involving native coronary artery of native heart with other form of angina pectoris (HCC) Stable, continue f/u with cardiology   Patient was given opportunity to ask questions. Patient verbalized understanding of the plan and was able to repeat key elements of the plan. All questions were answered to their satisfaction.  [name], FNP   I, [name], FNP, have reviewed all documentation for this visit. The documentation on 09/10/22 for the exam, diagnosis, procedures, and orders are all accurate and complete.   IF YOU HAVE BEEN REFERRED TO A SPECIALIST, IT MAY TAKE 1-2 WEEKS TO SCHEDULE/PROCESS THE REFERRAL. IF YOU HAVE NOT HEARD FROM US/SPECIALIST IN TWO WEEKS, PLEASE GIVE [place] A CALL AT [phone] X [phone].   THE PATIENT IS ENCOURAGED TO PRACTICE SOCIAL DISTANCING DUE TO THE COVID-19 PANDEMIC.

## 2022-09-15 DIAGNOSIS — N2889 Other specified disorders of kidney and ureter: Secondary | ICD-10-CM | POA: Insufficient documentation

## 2022-09-19 DIAGNOSIS — C3490 Malignant neoplasm of unspecified part of unspecified bronchus or lung: Secondary | ICD-10-CM | POA: Diagnosis not present

## 2022-09-23 ENCOUNTER — Telehealth: Payer: Self-pay

## 2022-09-23 NOTE — Telephone Encounter (Signed)
This nurse received a message from this patient requesting to know when she needs a scan completed.  This nurse forward patients request to the provider for recommendation.  No further questions or concerns noted at this time.

## 2022-09-24 ENCOUNTER — Telehealth: Payer: Self-pay | Admitting: Medical Oncology

## 2022-09-24 NOTE — Telephone Encounter (Signed)
Pt said she has transportation one way for her appt 02/01.

## 2022-09-25 ENCOUNTER — Other Ambulatory Visit: Payer: PPO

## 2022-09-25 ENCOUNTER — Inpatient Hospital Stay: Payer: PPO | Attending: Internal Medicine

## 2022-09-25 ENCOUNTER — Ambulatory Visit: Payer: PPO | Admitting: Internal Medicine

## 2022-09-25 ENCOUNTER — Ambulatory Visit: Payer: PPO

## 2022-09-25 ENCOUNTER — Inpatient Hospital Stay (HOSPITAL_BASED_OUTPATIENT_CLINIC_OR_DEPARTMENT_OTHER): Payer: PPO | Admitting: Internal Medicine

## 2022-09-25 ENCOUNTER — Encounter: Payer: Self-pay | Admitting: Physician Assistant

## 2022-09-25 ENCOUNTER — Other Ambulatory Visit: Payer: Self-pay

## 2022-09-25 ENCOUNTER — Encounter: Payer: Self-pay | Admitting: Internal Medicine

## 2022-09-25 ENCOUNTER — Inpatient Hospital Stay: Payer: PPO

## 2022-09-25 VITALS — BP 140/56 | HR 78

## 2022-09-25 DIAGNOSIS — N136 Pyonephrosis: Secondary | ICD-10-CM | POA: Insufficient documentation

## 2022-09-25 DIAGNOSIS — D649 Anemia, unspecified: Secondary | ICD-10-CM | POA: Insufficient documentation

## 2022-09-25 DIAGNOSIS — N183 Chronic kidney disease, stage 3 unspecified: Secondary | ICD-10-CM | POA: Diagnosis not present

## 2022-09-25 DIAGNOSIS — C3490 Malignant neoplasm of unspecified part of unspecified bronchus or lung: Secondary | ICD-10-CM

## 2022-09-25 DIAGNOSIS — I129 Hypertensive chronic kidney disease with stage 1 through stage 4 chronic kidney disease, or unspecified chronic kidney disease: Secondary | ICD-10-CM | POA: Insufficient documentation

## 2022-09-25 DIAGNOSIS — E039 Hypothyroidism, unspecified: Secondary | ICD-10-CM | POA: Diagnosis not present

## 2022-09-25 DIAGNOSIS — Z5112 Encounter for antineoplastic immunotherapy: Secondary | ICD-10-CM | POA: Diagnosis not present

## 2022-09-25 DIAGNOSIS — C3432 Malignant neoplasm of lower lobe, left bronchus or lung: Secondary | ICD-10-CM | POA: Insufficient documentation

## 2022-09-25 LAB — CBC WITH DIFFERENTIAL (CANCER CENTER ONLY)
Abs Immature Granulocytes: 0.03 10*3/uL (ref 0.00–0.07)
Basophils Absolute: 0 10*3/uL (ref 0.0–0.1)
Basophils Relative: 0 %
Eosinophils Absolute: 0.2 10*3/uL (ref 0.0–0.5)
Eosinophils Relative: 2 %
HCT: 29.6 % — ABNORMAL LOW (ref 36.0–46.0)
Hemoglobin: 9.1 g/dL — ABNORMAL LOW (ref 12.0–15.0)
Immature Granulocytes: 0 %
Lymphocytes Relative: 4 %
Lymphs Abs: 0.3 10*3/uL — ABNORMAL LOW (ref 0.7–4.0)
MCH: 28.6 pg (ref 26.0–34.0)
MCHC: 30.7 g/dL (ref 30.0–36.0)
MCV: 93.1 fL (ref 80.0–100.0)
Monocytes Absolute: 0.8 10*3/uL (ref 0.1–1.0)
Monocytes Relative: 10 %
Neutro Abs: 6.6 10*3/uL (ref 1.7–7.7)
Neutrophils Relative %: 84 %
Platelet Count: 238 10*3/uL (ref 150–400)
RBC: 3.18 MIL/uL — ABNORMAL LOW (ref 3.87–5.11)
RDW: 16 % — ABNORMAL HIGH (ref 11.5–15.5)
WBC Count: 8 10*3/uL (ref 4.0–10.5)
nRBC: 0 % (ref 0.0–0.2)

## 2022-09-25 LAB — CMP (CANCER CENTER ONLY)
ALT: 11 U/L (ref 0–44)
AST: 13 U/L — ABNORMAL LOW (ref 15–41)
Albumin: 3.6 g/dL (ref 3.5–5.0)
Alkaline Phosphatase: 77 U/L (ref 38–126)
Anion gap: 6 (ref 5–15)
BUN: 36 mg/dL — ABNORMAL HIGH (ref 8–23)
CO2: 27 mmol/L (ref 22–32)
Calcium: 9.6 mg/dL (ref 8.9–10.3)
Chloride: 104 mmol/L (ref 98–111)
Creatinine: 1.39 mg/dL — ABNORMAL HIGH (ref 0.44–1.00)
GFR, Estimated: 37 mL/min — ABNORMAL LOW (ref >60)
Glucose, Bld: 124 mg/dL — ABNORMAL HIGH (ref 70–99)
Potassium: 4.5 mmol/L (ref 3.5–5.1)
Sodium: 137 mmol/L (ref 135–145)
Total Bilirubin: 0.3 mg/dL (ref 0.3–1.2)
Total Protein: 7.8 g/dL (ref 6.5–8.1)

## 2022-09-25 LAB — IRON AND IRON BINDING CAPACITY (CC-WL,HP ONLY)
Iron: 29 ug/dL (ref 28–170)
Saturation Ratios: 11 % (ref 10.4–31.8)
TIBC: 269 ug/dL (ref 250–450)
UIBC: 240 ug/dL (ref 148–442)

## 2022-09-25 LAB — FERRITIN: Ferritin: 141 ng/mL (ref 11–307)

## 2022-09-25 MED ORDER — SODIUM CHLORIDE 0.9 % IV SOLN
1500.0000 mg | Freq: Once | INTRAVENOUS | Status: AC
  Administered 2022-09-25: 1500 mg via INTRAVENOUS
  Filled 2022-09-25: qty 30

## 2022-09-25 MED ORDER — SODIUM CHLORIDE 0.9 % IV SOLN: Freq: Once | INTRAVENOUS | Status: AC

## 2022-09-25 NOTE — Progress Notes (Signed)
[facility] [phone]   [phone]  OFFICE PROGRESS NOTE  [name], [address] [address]  DIAGNOSIS: Stage IIIA (T2a, N2, M0) non-small cell lung cancer, adenocarcinoma presented with left lower lobe lung mass in addition to left hilar and mediastinal lymphadenopathy diagnosed in November 2022 with no actionable mutation on the guardant blood test   PRIOR THERAPY: Weekly concurrent chemoradiation with carboplatin for an AUC of 2 and paclitaxel 45 mg per metered squared.  First dose on 08/05/2021.  Status post 6 cycles.  Last dose was given on September 03, 2021.   CURRENT THERAPY: Consolidation treatment with immunotherapy with Imfinzi 1500 Mg IV every 4 weeks.  First dose October 23, 2021.  Status post 12 cycles.  INTERVAL HISTORY: Abigail [name] 87 y.o. female turns to the clinic today for follow-up visit.  The patient is feeling fine today with no concerning complaints except for fatigue and the baseline shortness of breath and she is currently on home oxygen.  She denied having any chest pain, cough or hemoptysis.  She has no nausea, vomiting, diarrhea or constipation.  She has no headache or visual changes.  She was seen recently at the emergency department and treated for urinary tract infection.  She had CT renal performed during evaluation and that showed obstructive multiple large left nephrolithiasis measuring up to 2.3 cm with associated mild hydronephrosis.  She was seen by Dr. Louis Meckel and she has a follow-up appointment with him in April 2024.  She was not a good surgical candidate for extraction of this stones.  The patient has no current nausea, vomiting, diarrhea or constipation.  She has no headache or visual changes.  She is here today for evaluation before the last cycle of her treatment with consolidation immunotherapy.   MEDICAL HISTORY: Past Medical History:  Diagnosis Date   Anemia    Arthritis     knees, back   Carotid artery occlusion    Chronic kidney disease    stage 3 ckd no nephrologist   Complete uterine prolapse with prolapse of anterior vaginal wall    Complication of anesthesia    hard to wake up per pt   Constipation    Coronary artery disease    Diverticulitis yrs ago coialitis   Dyspnea    History of blood transfusion    History of radiation therapy    right lung 08/05/2021-09/18/2021  Dr Gery Pray   Hypertension    Hypothyroid    Lung cancer ([place])    Numbness    in hands at times   Pneumonia    Pre-diabetes    Scoliosis    STEMI (ST elevation myocardial infarction) ([place]) 10/26/2019   DES RCA   Wears dentures    full dentures   Wears glasses    for reading    ALLERGIES:  is allergic to codeine and norvasc [amlodipine].  MEDICATIONS:  Current Outpatient Medications  Medication Sig Dispense Refill   acetaminophen (TYLENOL) 325 MG tablet Take 2 tablets (650 mg total) by mouth every 6 (six) hours as needed for mild pain (or Fever >/= 101).     albuterol (VENTOLIN HFA) 108 (90 Base) MCG/ACT inhaler TAKE 2 PUFFS BY MOUTH EVERY 6 HOURS AS NEEDED FOR WHEEZE OR SHORTNESS OF BREATH 8.5 each 1   aspirin EC 81 MG tablet Take 81 mg by mouth at bedtime.     atorvastatin (LIPITOR) 80 MG tablet Take 1  tablet (80 mg total) by mouth daily at 6 PM. 90 tablet 3   budesonide-formoterol (SYMBICORT) 80-4.5 MCG/ACT inhaler Inhale 2 puffs into the lungs 2 (two) times daily. 3 each 1   cyclobenzaprine (FLEXERIL) 10 MG tablet Take 1 tablet (10 mg total) by mouth 3 (three) times daily as needed for muscle spasms. 30 tablet 1   Dextromethorphan-guaiFENesin (DELSYM [facility]/CHEST CONG DM CHILD) 5-100 MG/5ML LIQD Take 30 mLs by mouth every 6 (six) hours as needed (cough).     docusate sodium (COLACE) 100 MG capsule Take 100-200 mg by mouth See admin instructions. Take 100 mg in the morning and 200 mg at bedtime     esomeprazole (NEXIUM) 20 MG capsule Take 1 capsule (20 mg total) by mouth  2 (two) times daily before a meal. (Patient not taking: Reported on 07/31/2022) 60 capsule 3   estradiol (ESTRACE) 0.1 MG/GM vaginal cream Place 0.5 g vaginally once a week. Place 0.5g twice a week at opening of vagina (Patient not taking: Reported on 09/07/2022)     ezetimibe (ZETIA) 10 MG tablet Take 1 tablet (10 mg total) by mouth daily. (Patient not taking: Reported on 09/07/2022) 90 tablet 3   ferrous sulfate 325 (65 FE) MG EC tablet Take 1 tablet (325 mg total) by mouth daily with breakfast. 30 tablet 2   hydrOXYzine (ATARAX) 10 MG tablet Take 1 tablet (10 mg total) by mouth 3 (three) times daily as needed. (Patient taking differently: Take 10 mg by mouth 3 (three) times daily as needed for anxiety.) 30 tablet 0   levothyroxine (SYNTHROID) 175 MCG tablet Take 175 mcg (1 tablet) by mouth daily Monday-Friday 30 tablet 2   nitroGLYCERIN (NITROSTAT) 0.4 MG SL tablet Place 1 tablet (0.4 mg total) under the tongue every 5 (five) minutes x 3 doses as needed for chest pain. 25 tablet 3   OXYGEN Inhale into the lungs.     prochlorperazine (COMPAZINE) 10 MG tablet Take 1 tablet (10 mg total) by mouth every 6 (six) hours as needed for nausea or vomiting. 30 tablet 2   Throat Lozenges (LOZENGES MT) Use as directed 1 lozenge in the mouth or throat every 4 (four) hours as needed (cough).     Trospium Chloride 60 MG CP24 Take 1 capsule (60 mg total) by mouth daily. 30 capsule 11   No current facility-administered medications for this visit.    SURGICAL HISTORY:  Past Surgical History:  Procedure Laterality Date   ANTERIOR AND POSTERIOR REPAIR WITH SACROSPINOUS FIXATION N/A 12/20/2020   Procedure: SACROSPINOUS LIGAMENT FIXATION;  Surgeon: [name], MD;  Location: [facility];  Service: Gynecology;  Laterality: N/A;  Total time requested for all procedures is 2 hours   [place] age 33   lower   bartholin cyst removal  age 28's   BLADDER SUSPENSION N/A 12/20/2020    Procedure: TRANSVAGINAL TAPE (TVT) PROCEDURE;  Surgeon: [name], MD;  Location: [facility];  Service: Gynecology;  Laterality: N/A;   BRONCHIAL BRUSHINGS  07/15/2021   Procedure: BRONCHIAL BRUSHINGS;  Surgeon: Collene Gobble, MD;  Location: [facility] ENDOSCOPY;  Service: Pulmonary;;   BRONCHIAL NEEDLE ASPIRATION BIOPSY  07/15/2021   Procedure: BRONCHIAL NEEDLE ASPIRATION BIOPSIES;  Surgeon: Collene Gobble, MD;  Location: [facility] ENDOSCOPY;  Service: Pulmonary;;   CORONARY/GRAFT ACUTE MI REVASCULARIZATION N/A 10/26/2019   Procedure: Coronary/Graft Acute MI Revascularization;  Surgeon: Sherren Mocha, MD;  Location: [facility] CV LAB;  Service: Cardiovascular;  Laterality:  N/A;   CYSTOCELE REPAIR N/A 12/20/2020   Procedure: ANTERIOR AND POSTERIOR REPAIR WITH PERINEORRHAPHY;  Surgeon: [name], MD;  Location: [facility];  Service: Gynecology;  Laterality: N/A;   CYSTOSCOPY N/A 12/20/2020   Procedure: CYSTOSCOPY;  Surgeon: [name], MD;  Location: [facility];  Service: Gynecology;  Laterality: N/A;   ELBOW SURGERY  1990's   left   ENDOBRONCHIAL ULTRASOUND N/A 07/15/2021   Procedure: ENDOBRONCHIAL ULTRASOUND;  Surgeon: Collene Gobble, MD;  Location: [facility] ENDOSCOPY;  Service: Pulmonary;  Laterality: N/A;   HEMOSTASIS CONTROL  07/15/2021   Procedure: HEMOSTASIS CONTROL;  Surgeon: Collene Gobble, MD;  Location: [facility] ENDOSCOPY;  Service: Pulmonary;;   LEFT HEART CATH AND CORONARY ANGIOGRAPHY N/A 10/26/2019   Procedure: LEFT HEART CATH AND CORONARY ANGIOGRAPHY;  Surgeon: Sherren Mocha, MD;  Location: [facility] CV LAB;  Service: Cardiovascular;  Laterality: N/A;   TONSILLECTOMY      REVIEW OF SYSTEMS:  Constitutional: positive for fatigue Eyes: negative Ears, nose, mouth, throat, and face: negative Respiratory: positive for dyspnea on exertion Cardiovascular: negative Gastrointestinal: negative Genitourinary:positive  for dysuria and frequency Integument/breast: negative Hematologic/lymphatic: negative Musculoskeletal:positive for muscle weakness Neurological: negative Behavioral/Psych: negative Endocrine: negative Allergic/Immunologic: negative   PHYSICAL EXAMINATION: General appearance: alert, cooperative, fatigued, and no distress Head: Normocephalic, without obvious abnormality, atraumatic Neck: no adenopathy, no JVD, supple, symmetrical, trachea midline, and thyroid not enlarged, symmetric, no tenderness/mass/nodules Lymph nodes: Cervical, supraclavicular, and axillary nodes normal. Resp: clear to auscultation bilaterally Back: symmetric, no curvature. ROM normal. No CVA tenderness. Cardio: regular rate and rhythm, S1, S2 normal, no murmur, click, rub or gallop GI: soft, non-tender; bowel sounds normal; no masses,  no organomegaly Extremities: extremities normal, atraumatic, no cyanosis or edema Neurologic: Alert and oriented X 3, normal strength and tone. Normal symmetric reflexes. Normal coordination and gait  ECOG PERFORMANCE STATUS: 1 - Symptomatic but completely ambulatory  Blood pressure (!) 147/60, pulse 79, temperature 98.2 F (36.8 C), temperature source Oral, resp. rate 17, weight 146 lb 9.6 oz (66.5 kg), SpO2 100 %.  LABORATORY DATA: Lab Results  Component Value Date   WBC 8.0 09/25/2022   HGB 9.1 (L) 09/25/2022   HCT 29.6 (L) 09/25/2022   MCV 93.1 09/25/2022   PLT 238 09/25/2022      Chemistry      Component Value Date/Time   NA 137 09/25/2022 0857   NA 137 08/28/2021 0923   K 4.5 09/25/2022 0857   CL 104 09/25/2022 0857   CO2 27 09/25/2022 0857   BUN 36 (H) 09/25/2022 0857   BUN 20 08/28/2021 0923   CREATININE 1.39 (H) 09/25/2022 0857   CREATININE 1.08 04/18/2014 1516   GLU 104 12/23/2017 1111      Component Value Date/Time   CALCIUM 9.6 09/25/2022 0857   ALKPHOS 77 09/25/2022 0857   AST 13 (L) 09/25/2022 0857   ALT 11 09/25/2022 0857   BILITOT 0.3 09/25/2022  0857       RADIOGRAPHIC STUDIES: CT Renal Stone Study  Result Date: 09/06/2022 CLINICAL DATA:  Abdominal/flank pain, stone suspected EXAM: CT ABDOMEN AND PELVIS WITHOUT CONTRAST TECHNIQUE: Multidetector CT imaging of the abdomen and pelvis was performed following the standard protocol without IV contrast. RADIATION DOSE REDUCTION: This exam was performed according to the departmental dose-optimization program which includes automated exposure control, adjustment of the mA and/or kV according to patient size and/or use of iterative reconstruction technique. COMPARISON:  Head CT 07/09/2021 FINDINGS: Lower chest:  No acute abnormality. Moderate volume hiatal hernia partially visualized. Elevated left hemidiaphragm. Hepatobiliary: No focal liver abnormality. No gallstones, gallbladder wall thickening, or pericholecystic fluid. No biliary dilatation. Pancreas: No focal lesion. Normal pancreatic contour. No surrounding inflammatory changes. No main pancreatic ductal dilatation. Spleen: Normal in size without focal abnormality. Adrenals/Urinary Tract: No adrenal nodule bilaterally. Multiple large left renal stones measuring up to 2.3 cm. Associated mild left hydronephrosis. No left hydroureter. Query mild left urothelial thickening. A 2.3 cm fluid density lesion within left kidney likely represents a simple renal cyst. Simple renal cysts, in the absence of clinically indicated signs/symptoms, require no independent follow-up. A 2.1 cm hyperdense renal lesion with a density of 54 Hounsfield units. No right nephrolithiasis. No ureterolithiasis bilaterally. No right hydroureteronephrosis. The urinary bladder is unremarkable. Stomach/Bowel: Stomach is within normal limits. No evidence of bowel wall thickening or dilatation. Appendix appears normal. Vascular/Lymphatic: No abdominal aorta or iliac aneurysm. Severe atherosclerotic plaque of the aorta and its branches. No abdominal, pelvic, or inguinal lymphadenopathy.  Reproductive: Uterus and bilateral adnexa are unremarkable. Other: No intraperitoneal free fluid. No intraperitoneal free gas. No organized fluid collection. Musculoskeletal: Tiny fat containing umbilical hernia. No suspicious lytic or blastic osseous lesions. No acute displaced fracture. Multilevel severe degenerative changes of the spine. Associated moderate to severe multilevel osseous neural foraminal stenosis. IMPRESSION: 1. Obstructive multiple large left nephrolithiasis measuring up to 2.3 cm. Associated mild hydronephrosis. Correlate with urinalysis to exclude superimposed infection. Recommend urologic consultation. 2. Indeterminate 2.1 cm left renal hypodense lesion-recommend further evaluation with MRI renal protocol. When the patient is clinically stable and able to follow directions and hold their breath (preferably as an outpatient) further evaluation with dedicated MRI renal protocol should be considered. 3. Moderate volume hiatal hernia partially visualized. 4. Colonic diverticulosis with no acute diverticulitis. 5.  Aortic Atherosclerosis (ICD10-I70.0). Electronically Signed   By: Iven Finn M.D.   On: 09/06/2022 23:59    ASSESSMENT AND PLAN: This is a very pleasant 87 years old white female with stage IIIA (T2a, N2, M0) non-small cell lung cancer, adenocarcinoma presented with left lower lobe lung mass in addition to left hilar and mediastinal lymphadenopathy diagnosed in November 2022 with no actionable mutations The patient is currently undergoing a course of concurrent chemotherapy with radiation.  Her chemotherapy is in the form of carboplatin for AUC of 2 and paclitaxel 45 Mg/M2 status post 6 cycles.  She has been tolerating her treatment with concurrent chemoradiation fairly well except for the radiation-induced odynophagia and dysphagia. Her scan showed significant improvement in her disease with 65% reduction in the volume of the left lower lobe nodule.  She continues to have  persistent left hilar and infrahilar adenopathy. The patient is currently undergoing treatment with consolidation immunotherapy with Imfinzi 1500 Mg IV every 4 weeks.  Status post 12 cycles. The patient has been tolerating her treatment with immunotherapy fairly well with no concerning adverse effects. I recommended for her to proceed with cycle #13 today as planned. I will see her back for follow-up visit in 1 months for evaluation with repeat CT scan of the chest for restaging of her disease. Regarding the recent kidney stones, she is followed by urology and she was treated for urinary tract infection. For the anemia, she was advised to continue with oral iron tablet over-the-counter once or twice daily. For the COPD and persistent shortness of breath, I will refer the patient to Dr. Lamonte Sakai for evaluation and recommendation regarding her condition.  She is interested  in having a portable tank rather than the big oxygen tank so she can travel if needed. The patient was advised to call immediately if she has any concerning symptoms in the interval.  The patient voices understanding of current disease status and treatment options and is in agreement with the current care plan. The total time spent in the appointment was 30 minutes.  All questions were answered. The patient knows to call the clinic with any problems, questions or concerns. We can certainly see the patient much sooner if necessary.  Disclaimer: This note was dictated with voice recognition software. Similar sounding words can inadvertently be transcribed and may not be corrected upon review.

## 2022-09-25 NOTE — Patient Instructions (Signed)
[facility]  Discharge Instructions: Thank you for choosing [facility] to provide your oncology and hematology care.   If you have a lab appointment with the [facility], please go directly to the [facility] and check in at the registration area.   Wear comfortable clothing and clothing appropriate for easy access to any Portacath or PICC line.   We strive to give you quality time with your provider. You may need to reschedule your appointment if you arrive late (15 or more minutes).  Arriving late affects you and other patients whose appointments are after yours.  Also, if you miss three or more appointments without notifying the office, you may be dismissed from the clinic at the provider's discretion.      For prescription refill requests, have your pharmacy contact our office and allow 72 hours for refills to be completed.    Today you received the following chemotherapy and/or immunotherapy agents: Imfinzi      To help prevent nausea and vomiting after your treatment, we encourage you to take your nausea medication as directed.  BELOW ARE SYMPTOMS THAT SHOULD BE REPORTED IMMEDIATELY: *FEVER GREATER THAN 100.4 F (38 C) OR HIGHER *CHILLS OR SWEATING *NAUSEA AND VOMITING THAT IS NOT CONTROLLED WITH YOUR NAUSEA MEDICATION *UNUSUAL SHORTNESS OF BREATH *UNUSUAL BRUISING OR BLEEDING *URINARY PROBLEMS (pain or burning when urinating, or frequent urination) *BOWEL PROBLEMS (unusual diarrhea, constipation, pain near the anus) TENDERNESS IN MOUTH AND THROAT WITH OR WITHOUT PRESENCE OF ULCERS (sore throat, sores in mouth, or a toothache) UNUSUAL RASH, SWELLING OR PAIN  UNUSUAL VAGINAL DISCHARGE OR ITCHING   Items with * indicate a potential emergency and should be followed up as soon as possible or go to the Emergency Department if any problems should occur.  Please show the CHEMOTHERAPY ALERT CARD or IMMUNOTHERAPY ALERT CARD at  check-in to the Emergency Department and triage nurse.  Should you have questions after your visit or need to cancel or reschedule your appointment, please contact [facility]  Dept: [phone]  and follow the prompts.  Office hours are 8:00 a.m. to 4:30 p.m. Monday - Friday. Please note that voicemails left after 4:00 p.m. may not be returned until the following business day.  We are closed weekends and major holidays. You have access to a nurse at all times for urgent questions. Please call the main number to the clinic Dept: [phone] and follow the prompts.   For any non-urgent questions, you may also contact your provider using MyChart. We now offer e-Visits for anyone 54 and older to request care online for non-urgent symptoms. For details visit [email or web address].   Also download the MyChart app! Go to the app store, search "MyChart", open the app, select [facility], and log in with your MyChart username and password.  Masks are optional in the cancer centers. If you would like for your care team to wear a mask while they are taking care of you, please let them know. You may have one support person who is at least 85 years old accompany you for your appointments.

## 2022-09-27 ENCOUNTER — Ambulatory Visit
Admission: RE | Admit: 2022-09-27 | Discharge: 2022-09-27 | Disposition: A | Discharge status: Home / Self Care | Payer: PPO | Source: Ambulatory Visit | Attending: Nurse Practitioner | Admitting: Nurse Practitioner

## 2022-09-27 DIAGNOSIS — N2889 Other specified disorders of kidney and ureter: Secondary | ICD-10-CM | POA: Diagnosis not present

## 2022-09-27 LAB — T4: T4, Total: 8.6 ug/dL (ref 4.5–12.0)

## 2022-09-27 MED ORDER — GADOPICLENOL 0.5 MMOL/ML IV SOLN
7.0000 mL | Freq: Once | INTRAVENOUS | Status: AC | PRN
  Administered 2022-09-27: 7 mL via INTRAVENOUS

## 2022-09-30 ENCOUNTER — Telehealth: Payer: Self-pay

## 2022-09-30 ENCOUNTER — Other Ambulatory Visit: Payer: Self-pay | Admitting: Obstetrics and Gynecology

## 2022-09-30 DIAGNOSIS — N3281 Overactive bladder: Secondary | ICD-10-CM

## 2022-09-30 NOTE — Telephone Encounter (Signed)
This nurse attempted to reach this patient.  To advise of lab results and provider recommendation.  There was no answer and the voicemail box is full.  This nurse will attempt to reach patient again.  No further concerns at this time.

## 2022-10-01 ENCOUNTER — Telehealth: Payer: Self-pay | Admitting: Internal Medicine

## 2022-10-01 NOTE — Telephone Encounter (Signed)
Called patient regarding February appointments, patient is notified.

## 2022-10-03 ENCOUNTER — Other Ambulatory Visit: Payer: Self-pay | Admitting: Physician Assistant

## 2022-10-03 DIAGNOSIS — C3490 Malignant neoplasm of unspecified part of unspecified bronchus or lung: Secondary | ICD-10-CM

## 2022-10-05 ENCOUNTER — Other Ambulatory Visit: Payer: Self-pay | Admitting: Physician Assistant

## 2022-10-05 DIAGNOSIS — E039 Hypothyroidism, unspecified: Secondary | ICD-10-CM

## 2022-10-06 DIAGNOSIS — R310 Gross hematuria: Secondary | ICD-10-CM | POA: Diagnosis not present

## 2022-10-06 DIAGNOSIS — N200 Calculus of kidney: Secondary | ICD-10-CM | POA: Diagnosis not present

## 2022-10-06 NOTE — Telephone Encounter (Signed)
She alternates 175 mcg and 200 mcg dose

## 2022-10-10 ENCOUNTER — Encounter: Payer: Self-pay | Admitting: Obstetrics and Gynecology

## 2022-10-10 ENCOUNTER — Ambulatory Visit (INDEPENDENT_AMBULATORY_CARE_PROVIDER_SITE_OTHER): Payer: PPO | Admitting: Obstetrics and Gynecology

## 2022-10-10 VITALS — BP 132/48 | HR 73

## 2022-10-10 DIAGNOSIS — N952 Postmenopausal atrophic vaginitis: Secondary | ICD-10-CM | POA: Diagnosis not present

## 2022-10-10 DIAGNOSIS — N3281 Overactive bladder: Secondary | ICD-10-CM | POA: Diagnosis not present

## 2022-10-10 MED ORDER — ESTRADIOL 0.1 MG/GM VA CREA: TOPICAL_CREAM | VAGINAL | 11 refills | Status: DC

## 2022-10-10 NOTE — Patient Instructions (Signed)
Continue with Trospium for overactive bladder.   Restart vaginal estrogen twice a week. Refill was provided.

## 2022-10-10 NOTE — Progress Notes (Signed)
[facility] Return Visit  SUBJECTIVE  History of Present Illness: Abigail [name] is a 87 y.o. female seen in follow-up for overactive bladder.  Surgery: s/p anterior and posterior repair with perineorrhaphy, sacrospinous ligament fixation, midurethral sling and cystoscopy on 12/20/2020. She does not feel any symptoms of prolapse.   Taking the Trospium 60mg  ER . Feels the medication is still working well and she is happy with it.. But she pays $200 or a 3 month supply.   Recently seen in the hospital for left sided flank pain and gross hematuria. She was treated for a UTI and was noted to have a left staghorn calculi, which was present on prior images. Urine culture was positive for klebsiella penumoiae. She reports that she saw Dr Abigail [name] at [facility] and that they are not planning on a procedure due to her health concerns. She report she also had a scan which showed limited renal function on the left side. Has not been using the vaginal estrogen.   Has completed chemotherapy and radiation treatments for lung cancer. Also completed immunotherapy. Stage IIIA (T2a, N2, M0) non-small cell lung cancer, adenocarcinoma diagnosed in November 2022. Currently chronically dependent on oxygen.   Past Medical History: Patient  has a past medical history of Anemia, Arthritis, Carotid artery occlusion, Chronic kidney disease, Complete uterine prolapse with prolapse of anterior vaginal wall, Complication of anesthesia, Constipation, Coronary artery disease, Diverticulitis (yrs ago coialitis), Dyspnea, History of blood transfusion, History of radiation therapy, Hypertension, Hypothyroid, Lung cancer ([place]), Numbness, Pneumonia, Pre-diabetes, Scoliosis, STEMI (ST elevation myocardial infarction) ([place]) (10/26/2019), Wears dentures, and Wears glasses.   Past Surgical History: She  has a past surgical history that includes Elbow surgery (1990's); Coronary/Graft Acute MI Revascularization (N/A, 10/26/2019);  LEFT HEART CATH AND CORONARY ANGIOGRAPHY (N/A, 10/26/2019); Back surgery (1980 age 27); bartholin cyst removal (age 38's); Anterior and posterior repair with sacrospinous fixation (N/A, 12/20/2020); Cystocele repair (N/A, 12/20/2020); Bladder suspension (N/A, 12/20/2020); Cystoscopy (N/A, 12/20/2020); Tonsillectomy; Endobronchial ultrasound (N/A, 07/15/2021); Hemostasis control (07/15/2021); Bronchial brushings (07/15/2021); and Bronchial needle aspiration biopsy (07/15/2021).   Medications: She has a current medication list which includes the following prescription(s): acetaminophen, albuterol, aspirin ec, atorvastatin, budesonide-formoterol, cyclobenzaprine, dextromethorphan-guaifenesin, docusate sodium, esomeprazole, estradiol, ezetimibe, ferrous sulfate, hydroxyzine, levothyroxine, levothyroxine, nitroglycerin, oxygen-helium, prochlorperazine, throat lozenges, and trospium chloride.   Allergies: Patient is allergic to codeine and norvasc [amlodipine].   Social History: Patient  reports that she quit smoking about 36 years ago. Her smoking use included cigarettes. She has a 52.50 pack-year smoking history. She has never used smokeless tobacco. She reports that she does not drink alcohol and does not use drugs.      OBJECTIVE     Physical Exam: Vitals:   10/10/22 1121  BP: (!) 132/48  Pulse: 73    Gen: No apparent distress, A&O x 3.    ASSESSMENT AND PLAN    Abigail [name] is a 87 y.o. with:  1. Overactive bladder   2. Vaginal atrophy    - For OAB, continue with Trospium 60mg  daily. Refill provided last week. Will see if we can get a tier reduction with her insurance.  - Restart Estrace cream twice weekly for UTI prevention and atrophy. Refill provided. .  - If she has UTI symptoms, can come to office to provide urine sample.   Return 1 year or sooner if needed  [name], MD  Time spent: I spent 25 minutes dedicated to the care of this patient on the date of this  encounter to include pre-visit review of records, face-to-face time with the patient and post visit documentation.

## 2022-10-12 ENCOUNTER — Other Ambulatory Visit: Payer: Self-pay

## 2022-10-20 DIAGNOSIS — C3490 Malignant neoplasm of unspecified part of unspecified bronchus or lung: Secondary | ICD-10-CM | POA: Diagnosis not present

## 2022-10-22 ENCOUNTER — Encounter: Payer: Self-pay | Admitting: Emergency Medicine

## 2022-10-22 ENCOUNTER — Ambulatory Visit: Payer: PPO | Admitting: Emergency Medicine

## 2022-10-22 VITALS — BP 128/74 | HR 88 | Temp 97.5°F | Ht 66.0 in | Wt 141.0 lb

## 2022-10-22 DIAGNOSIS — J449 Chronic obstructive pulmonary disease, unspecified: Secondary | ICD-10-CM | POA: Diagnosis not present

## 2022-10-22 DIAGNOSIS — C3490 Malignant neoplasm of unspecified part of unspecified bronchus or lung: Secondary | ICD-10-CM | POA: Diagnosis not present

## 2022-10-22 DIAGNOSIS — J9611 Chronic respiratory failure with hypoxia: Secondary | ICD-10-CM | POA: Diagnosis not present

## 2022-10-22 DIAGNOSIS — R053 Chronic cough: Secondary | ICD-10-CM | POA: Diagnosis not present

## 2022-10-22 MED ORDER — BREZTRI AEROSPHERE 160-9-4.8 MCG/ACT IN AERO: 2.0000 | INHALATION_SPRAY | Freq: Two times a day (BID) | RESPIRATORY_TRACT | 6 refills | Status: DC

## 2022-10-22 NOTE — Assessment & Plan Note (Signed)
Get your CT scan of the chest tomorrow as planned and follow-up with Dr. Julien Nordmann

## 2022-10-22 NOTE — Assessment & Plan Note (Signed)
We will perform a walking oximetry today to see if we can qualify you for a portable oxygen concentrator.

## 2022-10-22 NOTE — Progress Notes (Signed)
Subjective:    Patient ID: Abigail [name], female    DOB: [birth year], 87 y.o.   MRN: [id number]  HPI ROV 08/20/21 --Abigail [name] is 43 and has a history of tobacco use, CAD, CKD stage III, hypertension, hypothyroidism.  I saw her in November for an abnormal CT scan of the chest with a left lower lobe mass and left hilar lymphadenopathy.  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  She is undergoing chemoradiation. She has albuterol that was just given to her, is not on any scheduled bronchodilator regimen.  Today she reports that she is having GERD, some swallowing difficulty and burning. ? Food transiently getting stuck. New since she started her treatment routine. She had a small bit of scant hemoptysis this am - that's the only blood she has seen. She has some exertional SOB, has been worse with the colder weather.   ROV 10/22/22 --follow-up visit for 87 year old woman with a history of tobacco use, CAD, CKD, hypertension, hypothyroidism and stage IIIa left lower lobe adenocarcinoma, hypoxemic resp failure started on 3L/min w exertion after her radiation.  She has been treated by Dr. Julien Nordmann and is currently on Imfinzi every 4 weeks.  She has been experiencing some progressive exertional dyspnea. She describes some increased exertional SOB over the last several months. She has weakness and fatigue. She has some daily cough - uses lozenges daily.   Has been managed on Symbicort, has albuterol which she uses very rarely.      Review of Systems As per HPi  Past Medical History:  Diagnosis Date   Anemia    Arthritis    knees, back   Carotid artery occlusion    Chronic kidney disease    stage 3 ckd no nephrologist   Complete uterine prolapse with prolapse of anterior vaginal wall    Complication of anesthesia    hard to wake up per pt   Constipation    Coronary artery disease    Diverticulitis yrs ago coialitis    Dyspnea    History of blood transfusion    History of radiation therapy    right lung 08/05/2021-09/18/2021  Dr Gery Pray   Hypertension    Hypothyroid    Lung cancer ([place])    Numbness    in hands at times   Pneumonia    Pre-diabetes    Scoliosis    STEMI (ST elevation myocardial infarction) ([place]) 10/26/2019   DES RCA   Wears dentures    full dentures   Wears glasses    for reading     Family History  Problem Relation Age of Onset   Hypertension Mother    Colon cancer Neg Hx    Stomach cancer Neg Hx    Esophageal cancer Neg Hx      Social History   Socioeconomic History   Marital status: Widowed    Spouse name: Not on file   Number of children: Not on file   Years of education: Not on file   Highest education level: Not on file  Occupational History   Occupation: semi retired  Tobacco Use   Smoking status: Former    Packs/day: 1.50    Years: 35.00    Total pack years: 52.50    Types: Cigarettes    Quit date: 04/05/1986    Years since quitting: 36.5   Smokeless tobacco: Never  Vaping Use   Vaping Use: Never  used  Substance and Sexual Activity   Alcohol use: No   Drug use: No   Sexual activity: Not Currently    Birth control/protection: Post-menopausal  Other Topics Concern   Not on file  Social History Narrative   Not on file   Social Determinants of Health   Financial Resource Strain: Low Risk  (02/19/2022)   Overall Financial Resource Strain (CARDIA)    Difficulty of Paying Living Expenses: Not hard at all  Food Insecurity: No Food Insecurity (02/19/2022)   Hunger Vital Sign    Worried About Running Out of Food in the Last Year: Never true    Ran Out of Food in the Last Year: Never true  Transportation Needs: No Transportation Needs (02/19/2022)   PRAPARE - Hydrologist (Medical): No    Lack of Transportation (Non-Medical): No  Physical Activity: Inactive (02/19/2022)   Exercise Vital Sign    Days of Exercise per  Week: 0 days    Minutes of Exercise per Session: 0 min  Stress: No Stress Concern Present (02/19/2022)   [place]    Feeling of Stress : Not at all  Social Connections: Not on file  Intimate Partner Violence: Not At Risk (12/29/2018)   Humiliation, Afraid, Rape, and Kick questionnaire    Fear of Current or Ex-Partner: No    Emotionally Abused: No    Physically Abused: No    Sexually Abused: No     Allergies  Allergen Reactions   Codeine Nausea And Vomiting   Norvasc [Amlodipine] Rash and Other (See Comments)    rash     Outpatient Medications Prior to Visit  Medication Sig Dispense Refill   acetaminophen (TYLENOL) 325 MG tablet Take 2 tablets (650 mg total) by mouth every 6 (six) hours as needed for mild pain (or Fever >/= 101).     albuterol (VENTOLIN HFA) 108 (90 Base) MCG/ACT inhaler TAKE 2 PUFFS BY MOUTH EVERY 6 HOURS AS NEEDED FOR WHEEZE OR SHORTNESS OF BREATH 8.5 each 1   aspirin EC 81 MG tablet Take 81 mg by mouth at bedtime.     atorvastatin (LIPITOR) 80 MG tablet Take 1 tablet (80 mg total) by mouth daily at 6 PM. 90 tablet 3   budesonide-formoterol (SYMBICORT) 80-4.5 MCG/ACT inhaler Inhale 2 puffs into the lungs 2 (two) times daily. 3 each 1   cyclobenzaprine (FLEXERIL) 10 MG tablet Take 1 tablet (10 mg total) by mouth 3 (three) times daily as needed for muscle spasms. 30 tablet 1   Dextromethorphan-guaiFENesin (DELSYM [facility]/CHEST CONG DM CHILD) 5-100 MG/5ML LIQD Take 30 mLs by mouth every 6 (six) hours as needed (cough).     docusate sodium (COLACE) 100 MG capsule Take 100-200 mg by mouth See admin instructions. Take 100 mg in the morning and 200 mg at bedtime     esomeprazole (NEXIUM) 20 MG capsule Take 1 capsule (20 mg total) by mouth 2 (two) times daily before a meal. 60 capsule 3   estradiol (ESTRACE) 0.1 MG/GM vaginal cream Place 0.5g twice a week at opening of vagina 42.5 g 11   ezetimibe (ZETIA) 10 MG  tablet Take 1 tablet (10 mg total) by mouth daily. 90 tablet 3   ferrous sulfate 325 (65 FE) MG EC tablet TAKE 1 TABLET BY MOUTH EVERY DAY WITH BREAKFAST 30 tablet 2   hydrOXYzine (ATARAX) 10 MG tablet Take 1 tablet (10 mg total) by mouth 3 (three) times  daily as needed. (Patient taking differently: Take 10 mg by mouth 3 (three) times daily as needed for anxiety.) 30 tablet 0   levothyroxine (SYNTHROID) 175 MCG tablet Take 175 mcg (1 tablet) by mouth daily Monday-Friday 30 tablet 2   levothyroxine (SYNTHROID) 200 MCG tablet TAKE 1 TABLET EVERY OTHER DAY. ALTERNATE BETWEEN THE 175 MCG AND THE 200 MCG 30 tablet 1   nitroGLYCERIN (NITROSTAT) 0.4 MG SL tablet Place 1 tablet (0.4 mg total) under the tongue every 5 (five) minutes x 3 doses as needed for chest pain. 25 tablet 3   OXYGEN Inhale into the lungs.     prochlorperazine (COMPAZINE) 10 MG tablet Take 1 tablet (10 mg total) by mouth every 6 (six) hours as needed for nausea or vomiting. 30 tablet 2   Throat Lozenges (LOZENGES MT) Use as directed 1 lozenge in the mouth or throat every 4 (four) hours as needed (cough).     Trospium Chloride 60 MG CP24 TAKE 1 CAPSULE BY MOUTH EVERY DAY 90 capsule 3   No facility-administered medications prior to visit.         Objective:   Physical Exam Vitals:   10/22/22 1102  BP: 128/74  Pulse: 88  Temp: (!) 97.5 F (36.4 C)  TempSrc: Oral  SpO2: 100%  Weight: 141 lb (64 kg)  Height: '5\' 6"'$  (1.676 m)   Gen: Pleasant, well-nourished, in no distress,  normal affect  ENT: No lesions,  mouth clear,  oropharynx clear, no postnasal drip  Neck: No JVD, no stridor  Lungs: No use of accessory muscles, no crackles or wheezing on normal respiration, no wheeze on forced expiration  Cardiovascular: RRR, heart sounds normal, no murmur or gallops, no peripheral edema  Musculoskeletal: No deformities, no cyanosis or clubbing  Neuro: alert, awake, non focal  Skin: Warm, no lesions or rash      Assessment  & Plan:  Malignant neoplasm of unspecified part of unspecified bronchus or lung ([place]) Get your CT scan of the chest tomorrow as planned and follow-up with Dr. Julien Nordmann  COPD (chronic obstructive pulmonary disease) ([place]) We will temporarily stop her Symbicort.  We will try starting Breztri 2 puffs twice a day.  Rinse and gargle after using.  Keep track of how this medication helps you.  If you get more benefit from it then we will continue it going forward. Keep albuterol available to use 2 puffs if needed for shortness of breath, chest tightness, wheezing. We will send a referral for home health assessment and home health physical therapy Follow with Dr. Lamonte Sakai in 2-3 months or sooner if you have any problems.   Chronic hypoxic respiratory failure (HCC) We will perform a walking oximetry today to see if we can qualify you for a portable oxygen concentrator.  Chronic cough With contribution of rhinitis, probably also GERD although symptoms seem to be controlled by her current PPI.  Try starting loratadine 10 mg once daily (generic Claritin) Continue your Nexium twice a day as you have been taking it   Baltazar Apo, MD, PhD 10/22/2022, 1:16 PM [facility] [phone] or if no answer before 7:00PM call [phone] For any issues after 7:00PM please call eLink [phone]

## 2022-10-22 NOTE — Patient Instructions (Addendum)
Get your CT scan of the chest tomorrow as planned and follow-up with Dr. Julien Nordmann We will temporarily stop her Symbicort.  We will try starting Breztri 2 puffs twice a day.  Rinse and gargle after using.  Keep track of how this medication helps you.  If you get more benefit from it then we will continue it going forward. Keep albuterol available to use 2 puffs if needed for shortness of breath, chest tightness, wheezing. Try starting loratadine 10 mg once daily (generic Claritin) Continue your Nexium twice a day as you have been taking it We will perform a walking oximetry today to see if we can qualify you for a portable oxygen concentrator. We will send a referral for home health assessment and home health physical therapy Follow with Dr. Lamonte Sakai in 2-3 months or sooner if you have any problems.

## 2022-10-22 NOTE — Assessment & Plan Note (Signed)
We will temporarily stop her Symbicort.  We will try starting Breztri 2 puffs twice a day.  Rinse and gargle after using.  Keep track of how this medication helps you.  If you get more benefit from it then we will continue it going forward. Keep albuterol available to use 2 puffs if needed for shortness of breath, chest tightness, wheezing. We will send a referral for home health assessment and home health physical therapy Follow with Dr. Lamonte Sakai in 2-3 months or sooner if you have any problems.

## 2022-10-22 NOTE — Assessment & Plan Note (Signed)
With contribution of rhinitis, probably also GERD although symptoms seem to be controlled by her current PPI.  Try starting loratadine 10 mg once daily (generic Claritin) Continue your Nexium twice a day as you have been taking it

## 2022-10-23 ENCOUNTER — Ambulatory Visit (HOSPITAL_COMMUNITY)
Admission: RE | Admit: 2022-10-23 | Discharge: 2022-10-23 | Disposition: A | Discharge status: Home / Self Care | Payer: PPO | Source: Ambulatory Visit | Attending: Internal Medicine | Admitting: Internal Medicine

## 2022-10-23 ENCOUNTER — Ambulatory Visit: Payer: PPO | Admitting: Physician Assistant

## 2022-10-23 ENCOUNTER — Inpatient Hospital Stay: Payer: PPO

## 2022-10-23 DIAGNOSIS — C3490 Malignant neoplasm of unspecified part of unspecified bronchus or lung: Secondary | ICD-10-CM | POA: Diagnosis not present

## 2022-10-23 DIAGNOSIS — J432 Centrilobular emphysema: Secondary | ICD-10-CM | POA: Diagnosis not present

## 2022-10-23 DIAGNOSIS — J90 Pleural effusion, not elsewhere classified: Secondary | ICD-10-CM | POA: Diagnosis not present

## 2022-10-23 LAB — CBC WITH DIFFERENTIAL (CANCER CENTER ONLY)
Abs Immature Granulocytes: 0.04 10*3/uL (ref 0.00–0.07)
Basophils Absolute: 0 10*3/uL (ref 0.0–0.1)
Basophils Relative: 0 %
Eosinophils Absolute: 0.2 10*3/uL (ref 0.0–0.5)
Eosinophils Relative: 2 %
HCT: 29.5 % — ABNORMAL LOW (ref 36.0–46.0)
Hemoglobin: 9.2 g/dL — ABNORMAL LOW (ref 12.0–15.0)
Immature Granulocytes: 0 %
Lymphocytes Relative: 4 %
Lymphs Abs: 0.4 10*3/uL — ABNORMAL LOW (ref 0.7–4.0)
MCH: 28.2 pg (ref 26.0–34.0)
MCHC: 31.2 g/dL (ref 30.0–36.0)
MCV: 90.5 fL (ref 80.0–100.0)
Monocytes Absolute: 0.7 10*3/uL (ref 0.1–1.0)
Monocytes Relative: 8 %
Neutro Abs: 7.6 10*3/uL (ref 1.7–7.7)
Neutrophils Relative %: 86 %
Platelet Count: 433 10*3/uL — ABNORMAL HIGH (ref 150–400)
RBC: 3.26 MIL/uL — ABNORMAL LOW (ref 3.87–5.11)
RDW: 15.8 % — ABNORMAL HIGH (ref 11.5–15.5)
WBC Count: 9 10*3/uL (ref 4.0–10.5)
nRBC: 0 % (ref 0.0–0.2)

## 2022-10-23 LAB — CMP (CANCER CENTER ONLY)
ALT: 13 U/L (ref 0–44)
AST: 19 U/L (ref 15–41)
Albumin: 3.5 g/dL (ref 3.5–5.0)
Alkaline Phosphatase: 74 U/L (ref 38–126)
Anion gap: 10 (ref 5–15)
BUN: 41 mg/dL — ABNORMAL HIGH (ref 8–23)
CO2: 23 mmol/L (ref 22–32)
Calcium: 8.8 mg/dL — ABNORMAL LOW (ref 8.9–10.3)
Chloride: 102 mmol/L (ref 98–111)
Creatinine: 1.69 mg/dL — ABNORMAL HIGH (ref 0.44–1.00)
GFR, Estimated: 30 mL/min — ABNORMAL LOW (ref >60)
Glucose, Bld: 153 mg/dL — ABNORMAL HIGH (ref 70–99)
Potassium: 4.6 mmol/L (ref 3.5–5.1)
Sodium: 135 mmol/L (ref 135–145)
Total Bilirubin: 0.2 mg/dL — ABNORMAL LOW (ref 0.3–1.2)
Total Protein: 8.2 g/dL — ABNORMAL HIGH (ref 6.5–8.1)

## 2022-10-23 MED ORDER — IOHEXOL 300 MG/ML  SOLN
75.0000 mL | Freq: Once | INTRAMUSCULAR | Status: AC | PRN
  Administered 2022-10-23: 75 mL via INTRAVENOUS

## 2022-10-24 DIAGNOSIS — J449 Chronic obstructive pulmonary disease, unspecified: Secondary | ICD-10-CM | POA: Diagnosis not present

## 2022-10-24 NOTE — Progress Notes (Unsigned)
[place] OFFICE PROGRESS NOTE  Abigail [name], [address] [address]  DIAGNOSIS: Stage IIIA (T2a, N2, M0) non-small cell lung cancer, adenocarcinoma presented with left lower lobe lung mass in addition to left hilar and mediastinal lymphadenopathy diagnosed in November 2022 with no actionable mutation on the guardant blood test     PRIOR THERAPY:  1) Weekly concurrent chemoradiation with carboplatin for an AUC of 2 and paclitaxel 45 mg per metered squared.  First dose on 08/05/2021.  Status post 6 cycles.  Last dose was given on September 03, 2021.    2) Consolidation treatment with immunotherapy with Imfinzi 1500 Mg IV every 4 weeks.  Last dose on 09/25/22.  Status post 13 cycles.   CURRENT THERAPY: Observation   INTERVAL HISTORY: Abigail [name] 87 y.o. female returns to the clinic today for a follow-up visit. The patient was last seen by Dr. Julien Nordmann 1 month ago.  At that point in time, she underwent her last cycle of consolidation immunotherapy with Imfinzi.  Overall she did tolerate her treatment well for the last year.  She denies any major changes in her health since last being seen but she mentions she recently saw pulmonary medicine and received a portable oxygen tank. She also mentions Dr. Agustina Caroli office was going to refer her to PT. Today, she denies any fever, chills, or night sweats. She gets meals on wheels and drinks ensure every day.  She reports stable dyspnea on exertion.  She is on 3 liters of supplemental oxygen via nasal cannula.  She sometimes has a cough which produces clear phlegm which is also unchanged. She has been using Delsym 12 hours if needed and uses lozenges. Denies any hemoptysis. The patient does have anemia for which she had stool cards performed which were positive for blood.  She was evaluated by GI who recommends close monitoring and supportive care with IV iron.  She received IV iron most recently on 05/22/22.   She takes  iron supplements. She saw cardiology recently who mentioned helping anemia will help with her angina symptoms.  She takes 200 mcg on Saturday-Sunday and takes 175 mcg Mon-Friday. She denies nausea or vomiting. She denies diarrhea or constipation.  Denies any rashes or skin changes.  She was seen in the emergency room a few weeks ago and found to have a kidney stone.  She followed up outpatient with Dr. Louis Meckel.  Her restaging CT scan again incidentally noted the large kidney stone.  The patient does report sometimes she experiences left flank pain.  She recently had a restaging CT scan performed.  She is here today for evaluation to review her scan results.  MEDICAL HISTORY: Past Medical History:  Diagnosis Date   Anemia    Arthritis    knees, back   Carotid artery occlusion    Chronic kidney disease    stage 3 ckd no nephrologist   Complete uterine prolapse with prolapse of anterior vaginal wall    Complication of anesthesia    hard to wake up per pt   Constipation    Coronary artery disease    Diverticulitis yrs ago coialitis   Dyspnea    History of blood transfusion    History of radiation therapy    right lung 08/05/2021-09/18/2021  Dr Gery Pray   Hypertension    Hypothyroid    Lung cancer ([place])    Numbness    in hands at times   Pneumonia  Pre-diabetes    Scoliosis    STEMI (ST elevation myocardial infarction) ([place]) 10/26/2019   DES RCA   Wears dentures    full dentures   Wears glasses    for reading    ALLERGIES:  is allergic to codeine and norvasc [amlodipine].  MEDICATIONS:  Current Outpatient Medications  Medication Sig Dispense Refill   acetaminophen (TYLENOL) 325 MG tablet Take 2 tablets (650 mg total) by mouth every 6 (six) hours as needed for mild pain (or Fever >/= 101).     albuterol (VENTOLIN HFA) 108 (90 Base) MCG/ACT inhaler TAKE 2 PUFFS BY MOUTH EVERY 6 HOURS AS NEEDED FOR WHEEZE OR SHORTNESS OF BREATH 8.5 each 1   aspirin EC 81 MG tablet Take 81 mg  by mouth at bedtime.     atorvastatin (LIPITOR) 80 MG tablet Take 1 tablet (80 mg total) by mouth daily at 6 PM. 90 tablet 3   Budeson-Glycopyrrol-Formoterol (BREZTRI AEROSPHERE) 160-9-4.8 MCG/ACT AERO Inhale 2 puffs into the lungs in the morning and at bedtime. 10.7 g 6   cyclobenzaprine (FLEXERIL) 10 MG tablet Take 1 tablet (10 mg total) by mouth 3 (three) times daily as needed for muscle spasms. 30 tablet 1   Dextromethorphan-guaiFENesin (DELSYM CGH/CHEST CONG DM CHILD) 5-100 MG/5ML LIQD Take 30 mLs by mouth every 6 (six) hours as needed (cough).     docusate sodium (COLACE) 100 MG capsule Take 100-200 mg by mouth See admin instructions. Take 100 mg in the morning and 200 mg at bedtime     esomeprazole (NEXIUM) 20 MG capsule Take 1 capsule (20 mg total) by mouth 2 (two) times daily before a meal. 60 capsule 3   estradiol (ESTRACE) 0.1 MG/GM vaginal cream Place 0.5g twice a week at opening of vagina 42.5 g 11   ezetimibe (ZETIA) 10 MG tablet Take 1 tablet (10 mg total) by mouth daily. 90 tablet 3   ferrous sulfate 325 (65 FE) MG EC tablet TAKE 1 TABLET BY MOUTH EVERY DAY WITH BREAKFAST 30 tablet 2   levothyroxine (SYNTHROID) 175 MCG tablet Take 175 mcg (1 tablet) by mouth daily Monday-Friday 30 tablet 2   levothyroxine (SYNTHROID) 200 MCG tablet TAKE 1 TABLET EVERY OTHER DAY. ALTERNATE BETWEEN THE 175 MCG AND THE 200 MCG 30 tablet 1   nitroGLYCERIN (NITROSTAT) 0.4 MG SL tablet Place 1 tablet (0.4 mg total) under the tongue every 5 (five) minutes x 3 doses as needed for chest pain. 25 tablet 3   OXYGEN Inhale into the lungs.     prochlorperazine (COMPAZINE) 10 MG tablet Take 1 tablet (10 mg total) by mouth every 6 (six) hours as needed for nausea or vomiting. 30 tablet 2   Throat Lozenges (LOZENGES MT) Use as directed 1 lozenge in the mouth or throat every 4 (four) hours as needed (cough).     Trospium Chloride 60 MG CP24 TAKE 1 CAPSULE BY MOUTH EVERY DAY 90 capsule 3   hydrOXYzine (ATARAX) 10 MG  tablet Take 1 tablet (10 mg total) by mouth 3 (three) times daily as needed. (Patient not taking: Reported on 10/28/2022) 30 tablet 0   No current facility-administered medications for this visit.    SURGICAL HISTORY:  Past Surgical History:  Procedure Laterality Date   ANTERIOR AND POSTERIOR REPAIR WITH SACROSPINOUS FIXATION N/A 12/20/2020   Procedure: SACROSPINOUS LIGAMENT FIXATION;  Surgeon: [name], MD;  Location: [facility];  Service: Gynecology;  Laterality: N/A;  Total time requested for all procedures is 2  hours   BACK SURGERY  1980 age 70   lower   bartholin cyst removal  age 9's   BLADDER SUSPENSION N/A 12/20/2020   Procedure: TRANSVAGINAL TAPE (TVT) PROCEDURE;  Surgeon: [name], MD;  Location: [facility];  Service: Gynecology;  Laterality: N/A;   BRONCHIAL BRUSHINGS  07/15/2021   Procedure: BRONCHIAL BRUSHINGS;  Surgeon: Collene Gobble, MD;  Location: [facility] ENDOSCOPY;  Service: Pulmonary;;   BRONCHIAL NEEDLE ASPIRATION BIOPSY  07/15/2021   Procedure: BRONCHIAL NEEDLE ASPIRATION BIOPSIES;  Surgeon: Collene Gobble, MD;  Location: [facility] ENDOSCOPY;  Service: Pulmonary;;   CORONARY/GRAFT ACUTE MI REVASCULARIZATION N/A 10/26/2019   Procedure: Coronary/Graft Acute MI Revascularization;  Surgeon: Sherren Mocha, MD;  Location: [place] CV LAB;  Service: Cardiovascular;  Laterality: N/A;   CYSTOCELE REPAIR N/A 12/20/2020   Procedure: ANTERIOR AND POSTERIOR REPAIR WITH PERINEORRHAPHY;  Surgeon: [name], MD;  Location: [facility];  Service: Gynecology;  Laterality: N/A;   CYSTOSCOPY N/A 12/20/2020   Procedure: CYSTOSCOPY;  Surgeon: [name], MD;  Location: [facility];  Service: Gynecology;  Laterality: N/A;   ELBOW SURGERY  1990's   left   ENDOBRONCHIAL ULTRASOUND N/A 07/15/2021   Procedure: ENDOBRONCHIAL ULTRASOUND;  Surgeon: Collene Gobble, MD;  Location[facility];   Service: Pulmonary;  Laterality: N/A;   HEMOSTASIS CONTROL  07/15/2021   Procedure: HEMOSTASIS CONTROL;  Surgeon: Collene Gobble, MD;  Location: [facility] ENDOSCOPY;  Service: Pulmonary;;   LEFT HEART CATH AND CORONARY ANGIOGRAPHY N/A 10/26/2019   Procedure: LEFT HEART CATH AND CORONARY ANGIOGRAPHY;  Surgeon: Sherren Mocha, MD;  Location: [place] CV LAB;  Service: Cardiovascular;  Laterality: N/A;   TONSILLECTOMY      REVIEW OF SYSTEMS:   Constitutional: Positive for stable fatigue and generalized weakness.  Negative for appetite change, chills, fever and unexpected weight change.  HENT: Negative for mouth sores, nosebleeds, sore throat and trouble swallowing.  Eyes: Negative for eye problems and icterus.  Respiratory: Positive for stable dyspnea on exertion and mild intermittent cough. Negative for  hemoptysis and wheezing.   Cardiovascular: Negative for chest pain and leg swelling.  Gastrointestinal: Negative for abdominal pain, diarrhea, nausea and vomiting, and constipation.  Genitourinary: Negative for bladder incontinence, difficulty urinating, dysuria, frequency and hematuria.   Musculoskeletal: Positive for occasional left flank pain.  Negative for gait problem, neck pain and neck stiffness.  Skin: Positive for several scattered skin lesions.  Neurological: Negative for dizziness, extremity weakness, gait problem, headaches, light-headedness and seizures.  Hematological: Negative for adenopathy. Does not bruise/bleed easily.  Psychiatric/Behavioral: Negative for confusion, depression and sleep disturbance. The patient is not nervous/anxious   PHYSICAL EXAMINATION:  Blood pressure 127/63, pulse 76, temperature 97.9 F (36.6 C), temperature source Temporal, resp. rate 17, height '5\' 6"'$  (1.676 m), SpO2 99 %.  ECOG PERFORMANCE STATUS: 2  Physical Exam  Constitutional: Oriented to person, place, and time and well-developed, well-nourished, and in no distress.  HENT:  Head:  Normocephalic and atraumatic.  Mouth/Throat: Oropharynx is clear and moist. No oropharyngeal exudate.  Eyes: Conjunctivae are normal. Right eye exhibits no discharge. Left eye exhibits no discharge. No scleral icterus.  Neck: Normal range of motion. Neck supple.  Cardiovascular: Normal rate, regular rhythm, normal heart sounds and intact distal pulses.   Pulmonary/Chest: Effort normal and breath sounds normal. No respiratory distress. No wheezes. No rales.  Abdominal: Soft. Bowel sounds are normal. Exhibits no distension and no mass. There is no  tenderness.  Musculoskeletal: Normal range of motion. Exhibits no edema.  Lymphadenopathy:    No cervical adenopathy.  Neurological: Alert and oriented to person, place, and time. Exhibits normal muscle tone. Gait normal. Coordination normal.  Skin: Skin is warm and dry. Several scattered dry plaque like appearing skin lesions on back. Not diaphoretic. No erythema. No pallor.  Psychiatric: Mood, memory and judgment normal.  Vitals reviewed.  LABORATORY DATA: Lab Results  Component Value Date   WBC 9.0 10/23/2022   HGB 9.2 (L) 10/23/2022   HCT 29.5 (L) 10/23/2022   MCV 90.5 10/23/2022   PLT 433 (H) 10/23/2022      Chemistry      Component Value Date/Time   NA 135 10/23/2022 1432   NA 137 08/28/2021 0923   K 4.6 10/23/2022 1432   CL 102 10/23/2022 1432   CO2 23 10/23/2022 1432   BUN 41 (H) 10/23/2022 1432   BUN 20 08/28/2021 0923   CREATININE 1.69 (H) 10/23/2022 1432   CREATININE 1.08 04/18/2014 1516   GLU 104 12/23/2017 1111      Component Value Date/Time   CALCIUM 8.8 (L) 10/23/2022 1432   ALKPHOS 74 10/23/2022 1432   AST 19 10/23/2022 1432   ALT 13 10/23/2022 1432   BILITOT 0.2 (L) 10/23/2022 1432       RADIOGRAPHIC STUDIES:  CT Chest W Contrast  Result Date: 10/24/2022 CLINICAL DATA:  Non-small cell lung cancer staging * Tracking Code: BO * EXAM: CT CHEST WITH CONTRAST TECHNIQUE: Multidetector CT imaging of the chest was  performed during intravenous contrast administration. RADIATION DOSE REDUCTION: This exam was performed according to the departmental dose-optimization program which includes automated exposure control, adjustment of the mA and/or kV according to patient size and/or use of iterative reconstruction technique. CONTRAST:  69m OMNIPAQUE IOHEXOL 300 MG/ML  SOLN COMPARISON:  07/28/2022 FINDINGS: Cardiovascular: Aortic atherosclerosis. Normal heart size. Three-vessel coronary artery calcifications. No pericardial effusion. Mediastinum/Nodes: No enlarged mediastinal, hilar, or axillary lymph nodes. Moderate hiatal hernia with intrathoracic position of the gastric fundus. Thyroid gland, trachea, and esophagus demonstrate no significant findings. Lungs/Pleura: Mild centrilobular emphysema. Background of fine centrilobular nodularity most concentrated in the lung apices. Unchanged post treatment/post radiation appearance of the left chest with very extensive perihilar fibrosis, consolidation, and volume loss. Small, chronic loculated left pleural effusion, unchanged. Upper Abdomen: No acute abnormality. Multiple large staghorn type calculi in the left kidney. Musculoskeletal: No chest wall abnormality. No acute osseous findings. IMPRESSION: 1. Unchanged post treatment/post radiation appearance of the left chest with very extensive perihilar fibrosis, consolidation, and volume loss. 2. No evidence of recurrent or metastatic disease in the chest. 3. Emphysema and background of fine centrilobular nodularity most concentrated in the lung apices, consistent with smoking-related respiratory bronchiolitis. 4. Coronary artery disease. 5. Moderate hiatal hernia with intrathoracic position of the gastric fundus. 6. Multiple large staghorn type calculi in the left kidney. Aortic Atherosclerosis (ICD10-I70.0) and Emphysema (ICD10-J43.9). Electronically Signed   By: ADelanna AhmadiM.D.   On: 10/24/2022 21:08     ASSESSMENT/PLAN:  This  is a very pleasant 88year old Caucasian female diagnosed with stage IIIa (T2a, N2, M0) non-small cell lung cancer, adenocarcinoma.  The patient presented with a left lower lobe lung mass in addition to left hilar mediastinal lymphadenopathy.  She was diagnosed in November 2022.  The patient did not have any actionable mutations by guardant 360 blood test.    The patient completed a course of concurrent chemotherapy with radiation.  Her chemotherapy is  in the form of carboplatin for AUC of 2 and paclitaxel 45 Mg/M2 status post 6 cycles.  She had been tolerating her treatment with concurrent chemoradiation fairly well except for the radiation-induced odynophagia and dysphagia which has since resolved.    Her initial scan showed significant improvement in her disease with 65% reduction in the volume of the left lower lobe nodule.  She continued to have persistent left hilar and infrahilar adenopathy.  The patient recently underwent treatment with consolidation immunotherapy with Imfinzi 1500 Mg IV every 4 weeks.  Status post 13 cycles.  The patient recently had a restaging CT scan performed.  Dr. Julien Nordmann personally and independently reviewed the scan and discussed the results with patient today.  The scan showed no evidence of disease progression.  Dr. Julien Nordmann recommend that she continue on observation with restaging CT scan of the chest in 3 months.  The patient takes 175 mcg of Synthroid on Monday through Fridays.  She takes 200 mcg on Sundays and Mondays.  She needs a refill of her 175 mcg.  I have sent this to her pharmacy.  Her thyroid was rechecked today and the results are pending.  If any dose adjustment is needed we will call the patient let her know later this week.   She will continue to use meals on wheels and ensure.    Once again, the scan incidentally noted the large renal stone.  The patient is aware of this and knows to follow-up with Dr. Louis Meckel if she starts getting symptoms of  blockage is worsening flank pain, hematuria, decreased urination, etc.  The patient was advised to call immediately if she has any concerning symptoms in the interval. The patient voices understanding of current disease status and treatment options and is in agreement with the current care plan. All questions were answered. The patient knows to call the clinic with any problems, questions or concerns. We can certainly see the patient much sooner if necessary    Orders Placed This Encounter  Procedures   CT Chest W Contrast    Standing Status:   Future    Standing Expiration Date:   10/28/2023    Order Specific Question:   If indicated for the ordered procedure, I authorize the administration of contrast media per Radiology protocol    Answer:   Yes    Order Specific Question:   Does the patient have a contrast media/X-ray dye allergy?    Answer:   No    Order Specific Question:   Preferred imaging location?    Answer:   [facility]   CBC with Differential ([place] Only)    Standing Status:   Future    Standing Expiration Date:   10/28/2023   CMP ([place] only)    Standing Status:   Future    Standing Expiration Date:   10/28/2023      Tobe Sos Abigail Redmon, PA-C 10/28/22  ADDENDUM: Hematology/Oncology Attending: I had a face-to-face encounter with the patient today.  I reviewed her record, lab, scan and recommended her care plan.  This is a very pleasant 87 years old white female with stage IIIa non-small cell lung cancer, adenocarcinoma diagnosed in November 2022 with no actionable mutations.  She is status post a course of concurrent chemoradiation with weekly carboplatin and paclitaxel followed by 1 year of consolidation treatment with immunotherapy with Imfinzi completed on September 25, 2022. The patient came to the clinic today for evaluation with repeat CT scan of the  chest. I personally and independently reviewed the scan images and discussed the result and  showed the images to the patient today. Her scan showed no concerning findings for disease progression or metastasis. I recommended for her to continue on observation with repeat CT scan of the chest in 3 months for restaging of her disease. The patient was advised to call immediately if she has any other concerning symptoms in the interval.  The total time spent in the appointment was 30 minutes. Disclaimer: This note was dictated with voice recognition software. Similar sounding words can inadvertently be transcribed and may be missed upon review. Eilleen Kempf, MD

## 2022-10-27 ENCOUNTER — Ambulatory Visit: Payer: Self-pay

## 2022-10-27 NOTE — Patient Instructions (Signed)
Visit Information  Thank you for taking time to visit with me today. Please don't hesitate to contact me if I can be of assistance to you.   Following are the goals we discussed today:   Goals Addressed             This Visit's Progress    Urinary frequency       Care Coordination Interventions: Evaluation of current treatment plan related to Urinary frequency and patient's adherence to plan as established by provider Determined patient experienced worsening symptoms related to UTI prior to dropping off specimen to PCP after last RN CC follow up call Determined patient had symptoms evaluated at [facility] at [place] of patient status, including review of consultant's reports, relevant laboratory and other test results, and medications completed Determined patient completed a renal CT scan indicating calculi of the left kidney with mild hydronephrosis  Discussed with patient treatment recommendations for calculi include no intervention at this time Educated patient on signs/symptoms of indicating movement of calculi and when to call the doctor  Reinforced the importance of increasing water to 48-64 oz daily unless otherwise directed  Most recent eGFR/CrCl:  Lab Results  Component Value Date   EGFR 36 (L) 08/28/2021    No components found for: "CRCL"         Our next appointment is by telephone on 12/01/22 at 12:30 PM  Please call the care guide team at [phone] if you need to cancel or reschedule your appointment.   If you are experiencing a Mental Health or [place] or need someone to talk to, please call 1-800-273-TALK (toll free, 24 hour hotline) go to [facility] [address], [place] [phone])  Patient verbalizes understanding of instructions and care plan provided today and agrees to view in [place]. Active MyChart status and patient understanding of how to access  instructions and care plan via MyChart confirmed with patient.     Barb Merino, RN, BSN, CCM Care Management Coordinator [facility] Care Management Direct Phone: [phone]

## 2022-10-27 NOTE — Patient Outreach (Signed)
  Care Coordination   Follow Up Visit Note   10/27/2022 Name: Abigail [name] MRN: [id number] DOB: 1936/07/08  Abigail [name] is a 87 y.o. year old female who sees [name], [place] for primary care. I spoke with  [name] by phone today.  What matters to the patients health and wellness today?  Patient will report any/all symptoms suggestive of UTI promptly to help avoid complications and or hospitalization.     Goals Addressed             This Visit's Progress    Urinary frequency       Care Coordination Interventions: Evaluation of current treatment plan related to Urinary frequency and patient's adherence to plan as established by provider Determined patient experienced worsening symptoms related to UTI prior to dropping off specimen to PCP after last RN CC follow up call Determined patient had symptoms evaluated at [facility] at [place] of patient status, including review of consultant's reports, relevant laboratory and other test results, and medications completed Determined patient completed a renal CT scan indicating calculi of the left kidney with mild hydronephrosis  Discussed with patient treatment recommendations for calculi include no intervention at this time Educated patient on signs/symptoms of indicating movement of calculi and when to call the doctor  Reinforced the importance of increasing water to 48-64 oz daily unless otherwise directed  Most recent eGFR/CrCl:  Lab Results  Component Value Date   EGFR 36 (L) 08/28/2021    No components found for: "CRCL"     Interventions Today    Flowsheet Row Most Recent Value  Chronic Disease   Chronic disease during today's visit Other  [Cancer,  Impaired Urinary Elimination]  General Interventions   General Interventions Discussed/Reviewed General Interventions Discussed, General Interventions Reviewed, Labs, Doctor Visits  Labs Kidney Function  Doctor Visits Discussed/Reviewed Doctor  Visits Discussed, Doctor Visits Reviewed, PCP, Specialist  PCP/Specialist Visits Compliance with follow-up visit  Nutrition Interventions   Nutrition Discussed/Reviewed Fluid intake          SDOH assessments and interventions completed:  No     Care Coordination Interventions:  Yes, provided   Follow up plan: Follow up call scheduled for 12/01/22 '@12'$ :30 PM    Encounter Outcome:  Pt. Visit Completed

## 2022-10-28 ENCOUNTER — Inpatient Hospital Stay: Payer: PPO

## 2022-10-28 ENCOUNTER — Encounter: Payer: Self-pay | Admitting: Physician Assistant

## 2022-10-28 ENCOUNTER — Other Ambulatory Visit: Payer: Self-pay

## 2022-10-28 ENCOUNTER — Encounter: Payer: Self-pay | Admitting: Internal Medicine

## 2022-10-28 ENCOUNTER — Inpatient Hospital Stay: Payer: PPO | Attending: Internal Medicine | Admitting: Physician Assistant

## 2022-10-28 VITALS — BP 127/63 | HR 76 | Temp 97.9°F | Resp 17 | Ht 66.0 in

## 2022-10-28 DIAGNOSIS — N200 Calculus of kidney: Secondary | ICD-10-CM | POA: Diagnosis not present

## 2022-10-28 DIAGNOSIS — D649 Anemia, unspecified: Secondary | ICD-10-CM | POA: Insufficient documentation

## 2022-10-28 DIAGNOSIS — Z85118 Personal history of other malignant neoplasm of bronchus and lung: Secondary | ICD-10-CM | POA: Insufficient documentation

## 2022-10-28 DIAGNOSIS — Z08 Encounter for follow-up examination after completed treatment for malignant neoplasm: Secondary | ICD-10-CM | POA: Insufficient documentation

## 2022-10-28 DIAGNOSIS — C3490 Malignant neoplasm of unspecified part of unspecified bronchus or lung: Secondary | ICD-10-CM | POA: Diagnosis not present

## 2022-10-28 DIAGNOSIS — Z79890 Hormone replacement therapy (postmenopausal): Secondary | ICD-10-CM | POA: Insufficient documentation

## 2022-10-30 ENCOUNTER — Other Ambulatory Visit: Payer: Self-pay | Admitting: Nurse Practitioner

## 2022-10-30 ENCOUNTER — Other Ambulatory Visit: Payer: Self-pay | Admitting: Interventional Cardiology

## 2022-10-30 DIAGNOSIS — K449 Diaphragmatic hernia without obstruction or gangrene: Secondary | ICD-10-CM

## 2022-10-30 DIAGNOSIS — E039 Hypothyroidism, unspecified: Secondary | ICD-10-CM

## 2022-10-30 NOTE — Telephone Encounter (Signed)
Yes

## 2022-10-31 ENCOUNTER — Other Ambulatory Visit: Payer: Self-pay | Admitting: Nurse Practitioner

## 2022-10-31 DIAGNOSIS — I10 Essential (primary) hypertension: Secondary | ICD-10-CM

## 2022-11-03 ENCOUNTER — Telehealth: Payer: Self-pay

## 2022-11-03 NOTE — Telephone Encounter (Signed)
Patient called for refill on Lisinopril-HCTZ  Per chart: lisinopril-hydrochlorothiazide (ZESTORETIC) 10-12.5 MG tablet  DISCONTINUED  Discontinued by: Lavina Hamman, MD on 09/07/2022 09:18  Reason: Stop Taking at Discharge    Please advise, thanks!

## 2022-11-04 ENCOUNTER — Other Ambulatory Visit: Payer: Self-pay | Admitting: Nurse Practitioner

## 2022-11-04 DIAGNOSIS — I10 Essential (primary) hypertension: Secondary | ICD-10-CM

## 2022-11-04 NOTE — Telephone Encounter (Signed)
Per Doreene Burke patient can hold medication until follow up 11/20/22.  Spoke with patient and advised. She will hold medication until ROV. Patient very upset that no one in the ED explained this to her, apologized for the lack of communication. Nothing further needed at this time.

## 2022-11-14 ENCOUNTER — Ambulatory Visit (INDEPENDENT_AMBULATORY_CARE_PROVIDER_SITE_OTHER): Payer: PPO

## 2022-11-14 DIAGNOSIS — R82998 Other abnormal findings in urine: Secondary | ICD-10-CM

## 2022-11-14 DIAGNOSIS — R350 Frequency of micturition: Secondary | ICD-10-CM | POA: Diagnosis not present

## 2022-11-14 DIAGNOSIS — Z08 Encounter for follow-up examination after completed treatment for malignant neoplasm: Secondary | ICD-10-CM | POA: Diagnosis not present

## 2022-11-14 LAB — POCT URINALYSIS DIPSTICK

## 2022-11-14 MED ORDER — SULFAMETHOXAZOLE-TRIMETHOPRIM 800-160 MG PO TABS: 1.0000 | ORAL_TABLET | Freq: Two times a day (BID) | ORAL | 0 refills | Status: DC

## 2022-11-14 NOTE — Progress Notes (Signed)
[name] is a 87 y.o. female arrived today with UTI sx.  Per Dr. Tommas Olp protocol: A urine specimen was collected and POCT Urine was done and urine culture sent to the lab. POCT Urine was Positive  Pt was notified and prescription sent to the preferred pharmacy.

## 2022-11-14 NOTE — Patient Instructions (Signed)
Your Urine dip that was done in office was Positive. I am sending the urine off for culture and you can take AZO over the counter for your discomfort.  We have also ordered Bactrim for you to take while we wait for your culture results, hopefully this gives you some relief. We will contact you when the results are back between 3-5 days. If a different antibiotic is needed we will sent the order to the pharmacy and you will be notified. If you have any questions or concerns please feel free to call us at [phone]   

## 2022-11-16 LAB — URINE CULTURE

## 2022-11-17 NOTE — Progress Notes (Signed)
Patient has been notified

## 2022-11-18 DIAGNOSIS — C3490 Malignant neoplasm of unspecified part of unspecified bronchus or lung: Secondary | ICD-10-CM | POA: Diagnosis not present

## 2022-11-20 ENCOUNTER — Ambulatory Visit (INDEPENDENT_AMBULATORY_CARE_PROVIDER_SITE_OTHER): Payer: PPO | Admitting: Nurse Practitioner

## 2022-11-20 ENCOUNTER — Encounter: Payer: Self-pay | Admitting: Nurse Practitioner

## 2022-11-20 VITALS — BP 128/42 | HR 73 | Temp 98.2°F | Ht 66.0 in | Wt 140.0 lb

## 2022-11-20 DIAGNOSIS — I119 Hypertensive heart disease without heart failure: Secondary | ICD-10-CM

## 2022-11-20 DIAGNOSIS — I25118 Atherosclerotic heart disease of native coronary artery with other forms of angina pectoris: Secondary | ICD-10-CM

## 2022-11-20 DIAGNOSIS — R300 Dysuria: Secondary | ICD-10-CM

## 2022-11-20 DIAGNOSIS — R531 Weakness: Secondary | ICD-10-CM | POA: Diagnosis not present

## 2022-11-20 DIAGNOSIS — K449 Diaphragmatic hernia without obstruction or gangrene: Secondary | ICD-10-CM | POA: Insufficient documentation

## 2022-11-20 DIAGNOSIS — I10 Essential (primary) hypertension: Secondary | ICD-10-CM

## 2022-11-20 LAB — POC URINALSYSI DIPSTICK (AUTOMATED)
Bilirubin, UA: NEGATIVE
Glucose, UA: NEGATIVE
Ketones, UA: NEGATIVE
Nitrite, UA: NEGATIVE
Protein, UA: POSITIVE — AB
Spec Grav, UA: 1.025 (ref 1.010–1.025)
Urobilinogen, UA: 0.2 E.U./dL
pH, UA: 6 (ref 5.0–8.0)

## 2022-11-20 NOTE — Progress Notes (Addendum)
I,Sheena H Holbrook,acting as a Neurosurgeon for [name], FNP.,have documented all relevant documentation on the behalf of [name], FNP,as directed by  [name], FNP while in the presence of [name], FNP.    Subjective:     Patient ID: Abigail [name] , female    DOB: Jun 27, 1936 , 87 y.o.   MRN: [id number]   Chief Complaint  Patient presents with   Medical Management of Chronic Issues    HPI  Patient presents today for hypertension follow up and urinary concerns. Patient was seen 3/22 by Urogyn and POC UA was done which showed possible UTI; culture was inconclusive and recommended recollect; patient was treated with Bactrim. She did not go to the hospital due to all the bills she has.   Patient feels better. Patient is weak.  She is now on a portable oxygen.  She is concerned because she felt she was already set up for surgery.      Past Medical History:  Diagnosis Date   Anemia    Arthritis    knees, back   Carotid artery occlusion    Chronic kidney disease    stage 3 ckd no nephrologist   Complete uterine prolapse with prolapse of anterior vaginal wall    Complication of anesthesia    hard to wake up per pt   Constipation    Coronary artery disease    Diverticulitis yrs ago coialitis   Dyspnea    History of blood transfusion    History of radiation therapy    right lung 08/05/2021-09/18/2021  Dr Antony Blackbird   Hypertension    Hypothyroid    Lung cancer    Numbness    in hands at times   Pneumonia    Pre-diabetes    Scoliosis    STEMI (ST elevation myocardial infarction) 10/26/2019   DES RCA   Wears dentures    full dentures   Wears glasses    for reading     Family History  Problem Relation Age of Onset   Hypertension Mother    Colon cancer Neg Hx    Stomach cancer Neg Hx    Esophageal cancer Neg Hx      Current Outpatient Medications:    acetaminophen (TYLENOL) 325 MG tablet, Take 2 tablets (650 mg total) by mouth every 6 (six) hours  as needed for mild pain (or Fever >/= 101)., Disp: , Rfl:    albuterol (VENTOLIN HFA) 108 (90 Base) MCG/ACT inhaler, TAKE 2 PUFFS BY MOUTH EVERY 6 HOURS AS NEEDED FOR WHEEZE OR SHORTNESS OF BREATH, Disp: 8.5 each, Rfl: 1   aspirin EC 81 MG tablet, Take 81 mg by mouth at bedtime., Disp: , Rfl:    atorvastatin (LIPITOR) 80 MG tablet, Take 1 tablet (80 mg total) by mouth daily at 6 PM., Disp: 90 tablet, Rfl: 3   Budeson-Glycopyrrol-Formoterol (BREZTRI AEROSPHERE) 160-9-4.8 MCG/ACT AERO, Inhale 2 puffs into the lungs in the morning and at bedtime., Disp: 10.7 g, Rfl: 6   cyclobenzaprine (FLEXERIL) 10 MG tablet, Take 1 tablet (10 mg total) by mouth 3 (three) times daily as needed for muscle spasms., Disp: 30 tablet, Rfl: 1   Dextromethorphan-guaiFENesin (DELSYM CGH/CHEST CONG DM CHILD) 5-100 MG/5ML LIQD, Take 30 mLs by mouth every 6 (six) hours as needed (cough)., Disp: , Rfl:    docusate sodium (COLACE) 100 MG capsule, Take 100-200 mg by mouth See admin instructions. Take 100 mg in the morning and 200 mg at bedtime, Disp: ,  Rfl:    esomeprazole (NEXIUM) 20 MG capsule, Take 1 capsule (20 mg total) by mouth 2 (two) times daily before a meal. (Patient not taking: Reported on 11/27/2022), Disp: 60 capsule, Rfl: 3   estradiol (ESTRACE) 0.1 MG/GM vaginal cream, Place 0.5g twice a week at opening of vagina, Disp: 42.5 g, Rfl: 11   ezetimibe (ZETIA) 10 MG tablet, Take 1 tablet (10 mg total) by mouth daily., Disp: 90 tablet, Rfl: 3   ferrous sulfate 325 (65 FE) MG EC tablet, TAKE 1 TABLET BY MOUTH EVERY DAY WITH BREAKFAST, Disp: 30 tablet, Rfl: 2   levothyroxine (SYNTHROID) 200 MCG tablet, TAKE 1 TABLET EVERY OTHER DAY. ALTERNATE BETWEEN THE 175 MCG AND THE 200 MCG, Disp: 30 tablet, Rfl: 1   nitroGLYCERIN (NITROSTAT) 0.4 MG SL tablet, Place 1 tablet (0.4 mg total) under the tongue every 5 (five) minutes x 3 doses as needed for chest pain., Disp: 25 tablet, Rfl: 3   OXYGEN, Inhale into the lungs., Disp: , Rfl:     prochlorperazine (COMPAZINE) 10 MG tablet, Take 1 tablet (10 mg total) by mouth every 6 (six) hours as needed for nausea or vomiting., Disp: 30 tablet, Rfl: 2   sulfamethoxazole-trimethoprim (BACTRIM DS) 800-160 MG tablet, Take 1 tablet by mouth 2 (two) times daily., Disp: 10 tablet, Rfl: 0   Throat Lozenges (LOZENGES MT), Use as directed 1 lozenge in the mouth or throat every 4 (four) hours as needed (cough)., Disp: , Rfl:    Trospium Chloride 60 MG CP24, TAKE 1 CAPSULE BY MOUTH EVERY DAY, Disp: 90 capsule, Rfl: 3   ciprofloxacin (CIPRO) 250 MG tablet, Take 1 tablet (250 mg total) by mouth 2 (two) times daily., Disp: 14 tablet, Rfl: 0   hydrOXYzine (ATARAX) 10 MG tablet, Take 1 tablet (10 mg total) by mouth 3 (three) times daily as needed., Disp: 30 tablet, Rfl: 0   levothyroxine (SYNTHROID) 175 MCG tablet, TAKE 175 MCG (1 TABLET) BY MOUTH DAILY MONDAY-FRIDAY, Disp: 30 tablet, Rfl: 2   Allergies  Allergen Reactions   Codeine Nausea And Vomiting   Norvasc [Amlodipine] Rash and Other (See Comments)    rash     Review of Systems  Constitutional: Negative.  Negative for fatigue.  Respiratory: Negative.    Cardiovascular: Negative.  Negative for chest pain, palpitations and leg swelling.  Gastrointestinal: Negative.  Negative for constipation.  Endocrine: Negative for cold intolerance.  Neurological:  Positive for weakness. Negative for tremors and headaches.     Today's Vitals   11/20/22 0830  BP: (!) 128/42  Pulse: 73  Temp: 98.2 F (36.8 C)  TempSrc: Oral  SpO2: 96%  Weight: 140 lb (63.5 kg)  Height: 5\' 6"  (1.676 m)   Body mass index is 22.6 kg/m.   Objective:  Physical Exam Vitals reviewed.  Constitutional:      General: She is not in acute distress.    Appearance: Normal appearance.  Cardiovascular:     Rate and Rhythm: Normal rate and regular rhythm.     Pulses: Normal pulses.     Heart sounds: Normal heart sounds. No murmur heard. Pulmonary:     Effort: Pulmonary  effort is normal. No respiratory distress.     Breath sounds: Normal breath sounds. No wheezing.  Skin:    General: Skin is warm and dry.     Capillary Refill: Capillary refill takes less than 2 seconds.  Neurological:     General: No focal deficit present.     Mental Status:  She is alert and oriented to person, place, and time.     Cranial Nerves: No cranial nerve deficit.     Motor: No weakness.  Psychiatric:        Mood and Affect: Mood normal.        Behavior: Behavior normal.        Thought Content: Thought content normal.        Judgment: Judgment normal.         Assessment And Plan:     1. Dysuria Comments: She was treated recently with Bactrim for possible UTI, doing better. Will send for another culture due to having multiple species. - POCT Urinalysis Dipstick (Automated) - Urine Culture  2. Hypertensive heart disease without congestive heart failure Comments: Blood pressure is more controlled. She is not taking lisinopril/HCTZ due to her blood pressure is normal. Explained she does not need at this time.  3. Coronary artery disease involving native coronary artery of native heart with other form of angina pectoris [facility]) Comments: Continue f/u with Cardiology  4. Hiatal hernia Comments: She has a large hiatal hernia and has a referral to General Surgery for May 2024. She may cancel the appt.  5. Generalized weakness Comments: Feels like she has heavy arms and slight pain to her back when starting to walk. Will order Home Health for PT. - CBC - Iron, TIBC and Ferritin Panel - Ambulatory referral to Home Health     Patient was given opportunity to ask questions. Patient verbalized understanding of the plan and was able to repeat key elements of the plan. All questions were answered to their satisfaction.  [name], FNP   I, [name], FNP, have reviewed all documentation for this visit. The documentation on 12/11/22 for the exam, diagnosis, procedures, and  orders are all accurate and complete.   IF YOU HAVE BEEN REFERRED TO A SPECIALIST, IT MAY TAKE 1-2 WEEKS TO SCHEDULE/PROCESS THE REFERRAL. IF YOU HAVE NOT HEARD FROM US/SPECIALIST IN TWO WEEKS, PLEASE GIVE [place] A CALL AT [phone].   THE PATIENT IS ENCOURAGED TO PRACTICE SOCIAL DISTANCING DUE TO THE COVID-19 PANDEMIC.

## 2022-11-21 LAB — CBC
Hematocrit: 29.1 % — ABNORMAL LOW (ref 34.0–46.6)
Hemoglobin: 9.3 g/dL — ABNORMAL LOW (ref 11.1–15.9)
MCH: 27.8 pg (ref 26.6–33.0)
MCHC: 32 g/dL (ref 31.5–35.7)
MCV: 87 fL (ref 79–97)
Platelets: 573 10*3/uL — ABNORMAL HIGH (ref 150–450)
RBC: 3.34 x10E6/uL — ABNORMAL LOW (ref 3.77–5.28)
RDW: 14.1 % (ref 11.7–15.4)
WBC: 11.1 10*3/uL — ABNORMAL HIGH (ref 3.4–10.8)

## 2022-11-21 LAB — IRON,TIBC AND FERRITIN PANEL
Ferritin: 391 ng/mL — ABNORMAL HIGH (ref 15–150)
Iron Saturation: 25 % (ref 15–55)
Iron: 59 ug/dL (ref 27–139)
Total Iron Binding Capacity: 236 ug/dL — ABNORMAL LOW (ref 250–450)
UIBC: 177 ug/dL (ref 118–369)

## 2022-11-24 DIAGNOSIS — K5790 Diverticulosis of intestine, part unspecified, without perforation or abscess without bleeding: Secondary | ICD-10-CM | POA: Diagnosis not present

## 2022-11-24 DIAGNOSIS — J9611 Chronic respiratory failure with hypoxia: Secondary | ICD-10-CM | POA: Diagnosis not present

## 2022-11-24 DIAGNOSIS — R300 Dysuria: Secondary | ICD-10-CM | POA: Diagnosis not present

## 2022-11-24 DIAGNOSIS — J449 Chronic obstructive pulmonary disease, unspecified: Secondary | ICD-10-CM | POA: Diagnosis not present

## 2022-11-24 DIAGNOSIS — I4510 Unspecified right bundle-branch block: Secondary | ICD-10-CM | POA: Diagnosis not present

## 2022-11-24 DIAGNOSIS — F419 Anxiety disorder, unspecified: Secondary | ICD-10-CM | POA: Diagnosis not present

## 2022-11-24 DIAGNOSIS — R7303 Prediabetes: Secondary | ICD-10-CM | POA: Diagnosis not present

## 2022-11-24 DIAGNOSIS — I25118 Atherosclerotic heart disease of native coronary artery with other forms of angina pectoris: Secondary | ICD-10-CM | POA: Diagnosis not present

## 2022-11-24 DIAGNOSIS — E785 Hyperlipidemia, unspecified: Secondary | ICD-10-CM | POA: Diagnosis not present

## 2022-11-24 DIAGNOSIS — D631 Anemia in chronic kidney disease: Secondary | ICD-10-CM | POA: Diagnosis not present

## 2022-11-24 DIAGNOSIS — R339 Retention of urine, unspecified: Secondary | ICD-10-CM | POA: Diagnosis not present

## 2022-11-24 DIAGNOSIS — I130 Hypertensive heart and chronic kidney disease with heart failure and stage 1 through stage 4 chronic kidney disease, or unspecified chronic kidney disease: Secondary | ICD-10-CM | POA: Diagnosis not present

## 2022-11-24 DIAGNOSIS — I252 Old myocardial infarction: Secondary | ICD-10-CM | POA: Diagnosis not present

## 2022-11-24 DIAGNOSIS — M1990 Unspecified osteoarthritis, unspecified site: Secondary | ICD-10-CM | POA: Diagnosis not present

## 2022-11-24 DIAGNOSIS — M419 Scoliosis, unspecified: Secondary | ICD-10-CM | POA: Diagnosis not present

## 2022-11-24 DIAGNOSIS — I5032 Chronic diastolic (congestive) heart failure: Secondary | ICD-10-CM | POA: Diagnosis not present

## 2022-11-24 DIAGNOSIS — N390 Urinary tract infection, site not specified: Secondary | ICD-10-CM | POA: Diagnosis not present

## 2022-11-24 DIAGNOSIS — R1310 Dysphagia, unspecified: Secondary | ICD-10-CM | POA: Diagnosis not present

## 2022-11-24 DIAGNOSIS — N1832 Chronic kidney disease, stage 3b: Secondary | ICD-10-CM | POA: Diagnosis not present

## 2022-11-24 DIAGNOSIS — N3281 Overactive bladder: Secondary | ICD-10-CM | POA: Diagnosis not present

## 2022-11-24 DIAGNOSIS — L89152 Pressure ulcer of sacral region, stage 2: Secondary | ICD-10-CM | POA: Diagnosis not present

## 2022-11-24 DIAGNOSIS — I6529 Occlusion and stenosis of unspecified carotid artery: Secondary | ICD-10-CM | POA: Diagnosis not present

## 2022-11-24 DIAGNOSIS — D509 Iron deficiency anemia, unspecified: Secondary | ICD-10-CM | POA: Diagnosis not present

## 2022-11-24 DIAGNOSIS — E039 Hypothyroidism, unspecified: Secondary | ICD-10-CM | POA: Diagnosis not present

## 2022-11-24 DIAGNOSIS — K5900 Constipation, unspecified: Secondary | ICD-10-CM | POA: Diagnosis not present

## 2022-11-25 LAB — URINE CULTURE

## 2022-11-26 ENCOUNTER — Other Ambulatory Visit: Payer: Self-pay | Admitting: Physician Assistant

## 2022-11-26 DIAGNOSIS — E039 Hypothyroidism, unspecified: Secondary | ICD-10-CM

## 2022-11-27 ENCOUNTER — Telehealth: Payer: Self-pay

## 2022-11-27 ENCOUNTER — Encounter: Payer: Self-pay | Admitting: Emergency Medicine

## 2022-11-27 ENCOUNTER — Other Ambulatory Visit: Payer: Self-pay | Admitting: Nurse Practitioner

## 2022-11-27 ENCOUNTER — Ambulatory Visit (INDEPENDENT_AMBULATORY_CARE_PROVIDER_SITE_OTHER): Payer: PPO | Admitting: Emergency Medicine

## 2022-11-27 VITALS — BP 134/76 | HR 74 | Temp 97.9°F | Ht 66.0 in | Wt 140.0 lb

## 2022-11-27 DIAGNOSIS — J449 Chronic obstructive pulmonary disease, unspecified: Secondary | ICD-10-CM

## 2022-11-27 DIAGNOSIS — N390 Urinary tract infection, site not specified: Secondary | ICD-10-CM

## 2022-11-27 DIAGNOSIS — J9611 Chronic respiratory failure with hypoxia: Secondary | ICD-10-CM | POA: Diagnosis not present

## 2022-11-27 DIAGNOSIS — C3490 Malignant neoplasm of unspecified part of unspecified bronchus or lung: Secondary | ICD-10-CM

## 2022-11-27 DIAGNOSIS — N3001 Acute cystitis with hematuria: Secondary | ICD-10-CM

## 2022-11-27 MED ORDER — CIPROFLOXACIN HCL 250 MG PO TABS: 250.0000 mg | ORAL_TABLET | Freq: Two times a day (BID) | ORAL | 0 refills | Status: DC

## 2022-11-27 NOTE — Patient Instructions (Signed)
We will plan to continue Breztri 2 puffs twice a day.  Rinse and gargle after using. Keep albuterol available to use 2 puffs up to every 4 hours if needed for shortness of breath, chest tightness, wheezing.  Continue oxygen 3 L/min when you are exerting yourself. Continue to follow with Dr. Julien Nordmann and get your surveillance CT scans of the chest as planned Keep your flu shot and your COVID-19 vaccine up-to-date this fall Follow-up with APP in 6 months Follow Dr. Lamonte Sakai in 12 months or sooner if you have any problems.

## 2022-11-27 NOTE — Telephone Encounter (Signed)
Patient called to say she would like another atbx for her UTI. She has not seen urology yet.   Please advise, thank you!

## 2022-11-27 NOTE — Progress Notes (Signed)
Subjective:    Patient ID: Abigail [name], female    DOB: 07/24/1936, 87 y.o.   MRN: [id number]  HPI  ROV 10/22/22 --follow-up visit for 87 year old woman with a history of tobacco use, CAD, CKD, hypertension, hypothyroidism and stage IIIa left lower lobe adenocarcinoma, hypoxemic resp failure started on 3L/min w exertion after her radiation.  She has been treated by Dr. Julien Nordmann and is currently on Imfinzi every 4 weeks.  She has been experiencing some progressive exertional dyspnea. She describes some increased exertional SOB over the last several months. She has weakness and fatigue. She has some daily cough - uses lozenges daily.   Has been managed on Symbicort, has albuterol which she uses very rarely.    ROV 11/27/22 --87 year old woman with a history of former tobacco use and COPD, CAD, CKD, hypertension.  She is followed by Dr. Julien Nordmann for stage IIIa left lower lobe adenocarcinoma, completed Imfinzi.  She has hypoxemia and is on 3 L/min with exertion (ever since her XRT), just qualified for pulsed oxygen at our last visit.  I saw her in late February and tried changing her Symbicort to [place] to see if she would get more benefit.  We also arranged for home health assessment to see if we get her home PT, but this never happened. She is still working on this w Dr Laurance Flatten.  Finally she was dealing with chronic cough and I tried recommended loratadine but she never started, continued Nexium twice daily.  She reports today that she is better, no longer coughing. She feels the Judithann Sauger helps her breathing more. She is using albuterol rarely. No flares.   CT chest 10/23/2022 reviewed by me shows background emphysematous change, postradiation changes with some perihilar fibrotic consolidation and volume loss in the left perihilar region, chronic small loculated left pleural effusion.  There is no evidence of recurrent disease or metastatic disease seen.    Review of Systems As per HPi  Past Medical  History:  Diagnosis Date   Anemia    Arthritis    knees, back   Carotid artery occlusion    Chronic kidney disease    stage 3 ckd no nephrologist   Complete uterine prolapse with prolapse of anterior vaginal wall    Complication of anesthesia    hard to wake up per pt   Constipation    Coronary artery disease    Diverticulitis yrs ago coialitis   Dyspnea    History of blood transfusion    History of radiation therapy    right lung 08/05/2021-09/18/2021  Dr Gery Pray   Hypertension    Hypothyroid    Lung cancer    Numbness    in hands at times   Pneumonia    Pre-diabetes    Scoliosis    STEMI (ST elevation myocardial infarction) 10/26/2019   DES RCA   Wears dentures    full dentures   Wears glasses    for reading     Family History  Problem Relation Age of Onset   Hypertension Mother    Colon cancer Neg Hx    Stomach cancer Neg Hx    Esophageal cancer Neg Hx      Social History   Socioeconomic History   Marital status: Widowed    Spouse name: Not on file   Number of children: Not on file   Years of education: Not on file   Highest education level: Not on file  Occupational History   Occupation: semi  retired  Tobacco Use   Smoking status: Former    Packs/day: 1.50    Years: 35.00    Additional pack years: 0.00    Total pack years: 52.50    Types: Cigarettes    Quit date: 04/05/1986    Years since quitting: 36.6   Smokeless tobacco: Never  Vaping Use   Vaping Use: Never used  Substance and Sexual Activity   Alcohol use: No   Drug use: No   Sexual activity: Not Currently    Birth control/protection: Post-menopausal  Other Topics Concern   Not on file  Social History Narrative   Not on file   Social Determinants of Health   Financial Resource Strain: Low Risk  (02/19/2022)   Overall Financial Resource Strain (CARDIA)    Difficulty of Paying Living Expenses: Not hard at all  Food Insecurity: No Food Insecurity (02/19/2022)   Hunger Vital Sign     Worried About Running Out of Food in the Last Year: Never true    Ran Out of Food in the Last Year: Never true  Transportation Needs: No Transportation Needs (02/19/2022)   PRAPARE - Hydrologist (Medical): No    Lack of Transportation (Non-Medical): No  Physical Activity: Inactive (02/19/2022)   Exercise Vital Sign    Days of Exercise per Week: 0 days    Minutes of Exercise per Session: 0 min  Stress: No Stress Concern Present (02/19/2022)   [place]    Feeling of Stress : Not at all  Social Connections: Not on file  Intimate Partner Violence: Not At Risk (12/29/2018)   Humiliation, Afraid, Rape, and Kick questionnaire    Fear of Current or Ex-Partner: No    Emotionally Abused: No    Physically Abused: No    Sexually Abused: No     Allergies  Allergen Reactions   Codeine Nausea And Vomiting   Norvasc [Amlodipine] Rash and Other (See Comments)    rash     Outpatient Medications Prior to Visit  Medication Sig Dispense Refill   acetaminophen (TYLENOL) 325 MG tablet Take 2 tablets (650 mg total) by mouth every 6 (six) hours as needed for mild pain (or Fever >/= 101).     albuterol (VENTOLIN HFA) 108 (90 Base) MCG/ACT inhaler TAKE 2 PUFFS BY MOUTH EVERY 6 HOURS AS NEEDED FOR WHEEZE OR SHORTNESS OF BREATH 8.5 each 1   aspirin EC 81 MG tablet Take 81 mg by mouth at bedtime.     atorvastatin (LIPITOR) 80 MG tablet Take 1 tablet (80 mg total) by mouth daily at 6 PM. 90 tablet 3   Budeson-Glycopyrrol-Formoterol (BREZTRI AEROSPHERE) 160-9-4.8 MCG/ACT AERO Inhale 2 puffs into the lungs in the morning and at bedtime. 10.7 g 6   cyclobenzaprine (FLEXERIL) 10 MG tablet Take 1 tablet (10 mg total) by mouth 3 (three) times daily as needed for muscle spasms. 30 tablet 1   Dextromethorphan-guaiFENesin (DELSYM CGH/CHEST CONG DM CHILD) 5-100 MG/5ML LIQD Take 30 mLs by mouth every 6 (six) hours as needed  (cough).     docusate sodium (COLACE) 100 MG capsule Take 100-200 mg by mouth See admin instructions. Take 100 mg in the morning and 200 mg at bedtime     estradiol (ESTRACE) 0.1 MG/GM vaginal cream Place 0.5g twice a week at opening of vagina 42.5 g 11   ezetimibe (ZETIA) 10 MG tablet Take 1 tablet (10 mg total) by mouth daily. [place]  tablet 3   ferrous sulfate 325 (65 FE) MG EC tablet TAKE 1 TABLET BY MOUTH EVERY DAY WITH BREAKFAST 30 tablet 2   hydrOXYzine (ATARAX) 10 MG tablet Take 1 tablet (10 mg total) by mouth 3 (three) times daily as needed. 30 tablet 0   levothyroxine (SYNTHROID) 175 MCG tablet TAKE 175 MCG (1 TABLET) BY MOUTH DAILY MONDAY-FRIDAY 30 tablet 2   levothyroxine (SYNTHROID) 200 MCG tablet TAKE 1 TABLET EVERY OTHER DAY. ALTERNATE BETWEEN THE 175 MCG AND THE 200 MCG 30 tablet 1   nitroGLYCERIN (NITROSTAT) 0.4 MG SL tablet Place 1 tablet (0.4 mg total) under the tongue every 5 (five) minutes x 3 doses as needed for chest pain. 25 tablet 3   OXYGEN Inhale into the lungs.     prochlorperazine (COMPAZINE) 10 MG tablet Take 1 tablet (10 mg total) by mouth every 6 (six) hours as needed for nausea or vomiting. 30 tablet 2   sulfamethoxazole-trimethoprim (BACTRIM DS) 800-160 MG tablet Take 1 tablet by mouth 2 (two) times daily. 10 tablet 0   Throat Lozenges (LOZENGES MT) Use as directed 1 lozenge in the mouth or throat every 4 (four) hours as needed (cough).     Trospium Chloride 60 MG CP24 TAKE 1 CAPSULE BY MOUTH EVERY DAY 90 capsule 3   esomeprazole (NEXIUM) 20 MG capsule Take 1 capsule (20 mg total) by mouth 2 (two) times daily before a meal. (Patient not taking: Reported on 11/27/2022) 60 capsule 3   No facility-administered medications prior to visit.         Objective:   Physical Exam Vitals:   11/27/22 1058  BP: 134/76  Pulse: 74  Temp: 97.9 F (36.6 C)  TempSrc: Oral  SpO2: 97%  Weight: 140 lb (63.5 kg)  Height: 5\' 6"  (1.676 m)   Gen: Pleasant, well-nourished, in no  distress,  normal affect  ENT: No lesions,  mouth clear,  oropharynx clear, no postnasal drip  Neck: No JVD, no stridor  Lungs: No use of accessory muscles, no wheeze, soft L mid lung insp crackles, no wheeze on forced expiration  Cardiovascular: RRR, heart sounds normal, no murmur or gallops, no peripheral edema  Musculoskeletal: No deformities, no cyanosis or clubbing  Neuro: alert, awake, non focal  Skin: Warm, no lesions or rash      Assessment & Plan:  COPD (chronic obstructive pulmonary disease) (HCC) Improved on the breztri - breathing and cough are both better.   We will plan to continue Breztri 2 puffs twice a day.  Rinse and gargle after using. Keep albuterol available to use 2 puffs up to every 4 hours if needed for shortness of breath, chest tightness, wheezing.  Keep your flu shot and your COVID-19 vaccine up-to-date this fall Follow-up with APP in 6 months Follow Dr. Lamonte Sakai in 12 months or sooner if you have any problems.  Malignant neoplasm of unspecified part of unspecified bronchus or lung ([place]) On observation, doing well, stable CT chest 10/23/2022  Continue to follow with Dr. Julien Nordmann and get your surveillance CT scans of the chest as planned  Chronic hypoxic respiratory failure ([place]) She was able to get her POC, using reliably.  Continue oxygen 3 L/min when you are exerting yourself.   Baltazar Apo, MD, PhD 11/27/2022, 11:17 AM [facility] [phone] or if no answer before 7:00PM call [phone] For any issues after 7:00PM please call eLink [phone]

## 2022-11-27 NOTE — Assessment & Plan Note (Signed)
Improved on the breztri - breathing and cough are both better.   We will plan to continue Breztri 2 puffs twice a day.  Rinse and gargle after using. Keep albuterol available to use 2 puffs up to every 4 hours if needed for shortness of breath, chest tightness, wheezing.  Keep your flu shot and your COVID-19 vaccine up-to-date this fall Follow-up with APP in 6 months Follow Dr. Lamonte Sakai in 12 months or sooner if you have any problems.

## 2022-11-27 NOTE — Assessment & Plan Note (Signed)
On observation, doing well, stable CT chest 10/23/2022  Continue to follow with Dr. Julien Nordmann and get your surveillance CT scans of the chest as planned

## 2022-11-27 NOTE — Assessment & Plan Note (Signed)
She was able to get her POC, using reliably.  Continue oxygen 3 L/min when you are exerting yourself.

## 2022-12-03 DIAGNOSIS — D492 Neoplasm of unspecified behavior of bone, soft tissue, and skin: Secondary | ICD-10-CM | POA: Diagnosis not present

## 2022-12-03 DIAGNOSIS — L538 Other specified erythematous conditions: Secondary | ICD-10-CM | POA: Diagnosis not present

## 2022-12-03 DIAGNOSIS — L821 Other seborrheic keratosis: Secondary | ICD-10-CM | POA: Diagnosis not present

## 2022-12-03 DIAGNOSIS — C44619 Basal cell carcinoma of skin of left upper limb, including shoulder: Secondary | ICD-10-CM | POA: Diagnosis not present

## 2022-12-05 DIAGNOSIS — N1832 Chronic kidney disease, stage 3b: Secondary | ICD-10-CM | POA: Diagnosis not present

## 2022-12-05 DIAGNOSIS — J449 Chronic obstructive pulmonary disease, unspecified: Secondary | ICD-10-CM | POA: Diagnosis not present

## 2022-12-05 DIAGNOSIS — R339 Retention of urine, unspecified: Secondary | ICD-10-CM | POA: Diagnosis not present

## 2022-12-05 DIAGNOSIS — I5032 Chronic diastolic (congestive) heart failure: Secondary | ICD-10-CM | POA: Diagnosis not present

## 2022-12-05 DIAGNOSIS — R1310 Dysphagia, unspecified: Secondary | ICD-10-CM | POA: Diagnosis not present

## 2022-12-05 DIAGNOSIS — I25118 Atherosclerotic heart disease of native coronary artery with other forms of angina pectoris: Secondary | ICD-10-CM | POA: Diagnosis not present

## 2022-12-05 DIAGNOSIS — D509 Iron deficiency anemia, unspecified: Secondary | ICD-10-CM | POA: Diagnosis not present

## 2022-12-05 DIAGNOSIS — E039 Hypothyroidism, unspecified: Secondary | ICD-10-CM | POA: Diagnosis not present

## 2022-12-05 DIAGNOSIS — M1990 Unspecified osteoarthritis, unspecified site: Secondary | ICD-10-CM | POA: Diagnosis not present

## 2022-12-05 DIAGNOSIS — D631 Anemia in chronic kidney disease: Secondary | ICD-10-CM | POA: Diagnosis not present

## 2022-12-05 DIAGNOSIS — I6529 Occlusion and stenosis of unspecified carotid artery: Secondary | ICD-10-CM | POA: Diagnosis not present

## 2022-12-05 DIAGNOSIS — R300 Dysuria: Secondary | ICD-10-CM | POA: Diagnosis not present

## 2022-12-05 DIAGNOSIS — E785 Hyperlipidemia, unspecified: Secondary | ICD-10-CM | POA: Diagnosis not present

## 2022-12-05 DIAGNOSIS — L89152 Pressure ulcer of sacral region, stage 2: Secondary | ICD-10-CM | POA: Diagnosis not present

## 2022-12-05 DIAGNOSIS — N3281 Overactive bladder: Secondary | ICD-10-CM | POA: Diagnosis not present

## 2022-12-05 DIAGNOSIS — K5900 Constipation, unspecified: Secondary | ICD-10-CM | POA: Diagnosis not present

## 2022-12-05 DIAGNOSIS — M419 Scoliosis, unspecified: Secondary | ICD-10-CM | POA: Diagnosis not present

## 2022-12-05 DIAGNOSIS — I130 Hypertensive heart and chronic kidney disease with heart failure and stage 1 through stage 4 chronic kidney disease, or unspecified chronic kidney disease: Secondary | ICD-10-CM | POA: Diagnosis not present

## 2022-12-05 DIAGNOSIS — I252 Old myocardial infarction: Secondary | ICD-10-CM | POA: Diagnosis not present

## 2022-12-05 DIAGNOSIS — J9611 Chronic respiratory failure with hypoxia: Secondary | ICD-10-CM | POA: Diagnosis not present

## 2022-12-05 DIAGNOSIS — Z9181 History of falling: Secondary | ICD-10-CM | POA: Diagnosis not present

## 2022-12-05 DIAGNOSIS — I4510 Unspecified right bundle-branch block: Secondary | ICD-10-CM | POA: Diagnosis not present

## 2022-12-05 DIAGNOSIS — F419 Anxiety disorder, unspecified: Secondary | ICD-10-CM | POA: Diagnosis not present

## 2022-12-05 DIAGNOSIS — K5790 Diverticulosis of intestine, part unspecified, without perforation or abscess without bleeding: Secondary | ICD-10-CM | POA: Diagnosis not present

## 2022-12-05 DIAGNOSIS — R7303 Prediabetes: Secondary | ICD-10-CM | POA: Diagnosis not present

## 2022-12-08 ENCOUNTER — Telehealth: Payer: Self-pay

## 2022-12-08 NOTE — Telephone Encounter (Signed)
Shanika w/[facility] reports patient has small sacral ulcer/wound and would like approval to apply Desitin to the area.  Please advise, thanks!

## 2022-12-09 NOTE — Telephone Encounter (Signed)
LVM for Yvone Neu to return call

## 2022-12-15 ENCOUNTER — Telehealth: Payer: Self-pay

## 2022-12-15 DIAGNOSIS — L89152 Pressure ulcer of sacral region, stage 2: Secondary | ICD-10-CM | POA: Diagnosis not present

## 2022-12-15 DIAGNOSIS — K5900 Constipation, unspecified: Secondary | ICD-10-CM | POA: Diagnosis not present

## 2022-12-15 DIAGNOSIS — E785 Hyperlipidemia, unspecified: Secondary | ICD-10-CM | POA: Diagnosis not present

## 2022-12-15 DIAGNOSIS — N1832 Chronic kidney disease, stage 3b: Secondary | ICD-10-CM | POA: Diagnosis not present

## 2022-12-15 DIAGNOSIS — I130 Hypertensive heart and chronic kidney disease with heart failure and stage 1 through stage 4 chronic kidney disease, or unspecified chronic kidney disease: Secondary | ICD-10-CM | POA: Diagnosis not present

## 2022-12-15 DIAGNOSIS — I25118 Atherosclerotic heart disease of native coronary artery with other forms of angina pectoris: Secondary | ICD-10-CM | POA: Diagnosis not present

## 2022-12-15 DIAGNOSIS — I5032 Chronic diastolic (congestive) heart failure: Secondary | ICD-10-CM | POA: Diagnosis not present

## 2022-12-15 DIAGNOSIS — R7303 Prediabetes: Secondary | ICD-10-CM | POA: Diagnosis not present

## 2022-12-15 DIAGNOSIS — F419 Anxiety disorder, unspecified: Secondary | ICD-10-CM | POA: Diagnosis not present

## 2022-12-15 DIAGNOSIS — J9611 Chronic respiratory failure with hypoxia: Secondary | ICD-10-CM | POA: Diagnosis not present

## 2022-12-15 DIAGNOSIS — R339 Retention of urine, unspecified: Secondary | ICD-10-CM | POA: Diagnosis not present

## 2022-12-15 DIAGNOSIS — M419 Scoliosis, unspecified: Secondary | ICD-10-CM | POA: Diagnosis not present

## 2022-12-15 DIAGNOSIS — I4510 Unspecified right bundle-branch block: Secondary | ICD-10-CM | POA: Diagnosis not present

## 2022-12-15 DIAGNOSIS — E039 Hypothyroidism, unspecified: Secondary | ICD-10-CM | POA: Diagnosis not present

## 2022-12-15 DIAGNOSIS — J449 Chronic obstructive pulmonary disease, unspecified: Secondary | ICD-10-CM | POA: Diagnosis not present

## 2022-12-15 DIAGNOSIS — K5790 Diverticulosis of intestine, part unspecified, without perforation or abscess without bleeding: Secondary | ICD-10-CM | POA: Diagnosis not present

## 2022-12-15 DIAGNOSIS — D509 Iron deficiency anemia, unspecified: Secondary | ICD-10-CM | POA: Diagnosis not present

## 2022-12-15 DIAGNOSIS — N3281 Overactive bladder: Secondary | ICD-10-CM | POA: Diagnosis not present

## 2022-12-15 DIAGNOSIS — I252 Old myocardial infarction: Secondary | ICD-10-CM | POA: Diagnosis not present

## 2022-12-15 DIAGNOSIS — D631 Anemia in chronic kidney disease: Secondary | ICD-10-CM | POA: Diagnosis not present

## 2022-12-15 DIAGNOSIS — M1990 Unspecified osteoarthritis, unspecified site: Secondary | ICD-10-CM | POA: Diagnosis not present

## 2022-12-15 DIAGNOSIS — I6529 Occlusion and stenosis of unspecified carotid artery: Secondary | ICD-10-CM | POA: Diagnosis not present

## 2022-12-15 DIAGNOSIS — R1310 Dysphagia, unspecified: Secondary | ICD-10-CM | POA: Diagnosis not present

## 2022-12-15 DIAGNOSIS — R300 Dysuria: Secondary | ICD-10-CM | POA: Diagnosis not present

## 2022-12-15 NOTE — Telephone Encounter (Signed)
Marissa w/[facility] would like verbal order for 1 SW visit to assist patient with community resources.   Please advise, thanks!

## 2022-12-16 ENCOUNTER — Ambulatory Visit: Payer: Self-pay

## 2022-12-16 ENCOUNTER — Telehealth: Payer: Self-pay

## 2022-12-16 DIAGNOSIS — C3490 Malignant neoplasm of unspecified part of unspecified bronchus or lung: Secondary | ICD-10-CM

## 2022-12-16 DIAGNOSIS — I10 Essential (primary) hypertension: Secondary | ICD-10-CM

## 2022-12-16 NOTE — Telephone Encounter (Signed)
Telephone encounter was:  Successful.  12/16/2022 Name: Abigail [name] MRN: [id number] DOB: 09/22/1935  Abigail [name] is a 87 y.o. year old female who is a primary care patient of [name], FNP . The community resource team was consulted for assistance with Transportation Needs   Care guide performed the following interventions: Patient provided with information about care guide support team and interviewed to confirm resource needs.Transportation 5/1 [facility]  Follow Up Plan:  Care guide will follow up with patient by phone over the next day    Lenard Forth [facility] Guide, [facility] [phone] [address] [place], [place], [place] [address] Phone: [phone] Email: [email or web address]     Abigail [name] DOB: [birth year] MRN: [id number]   RIDER WAIVER AND RELEASE OF LIABILITY  For purposes of improving physical access to our facilities, [facility] is pleased to partner with third parties to provide [facility] patients or other authorized individuals the option of convenient, on-demand ground transportation services (the Chiropractor") through use of the technology service that enables users to request on-demand ground transportation from independent third-party providers.  By opting to use and accept these [employer], I, the undersigned, hereby agree on behalf of myself, and on behalf of any minor child using the Science writer for whom I am the parent or legal guardian, as follows:  Science writer provided to me are provided by independent third-party transportation providers who are not [employer] or employees and who are unaffiliated with [employer]. [employer] is neither a transportation carrier nor a common or public carrier. [facility] has no control over the quality or safety of the transportation that occurs as a result of the [employer]. [facility] cannot guarantee that any third-party  transportation provider will complete any arranged transportation service. [facility] makes no representation, warranty, or guarantee regarding the reliability, timeliness, quality, safety, suitability, or availability of any of the Transport Services or that they will be error free. I fully understand that traveling by vehicle involves risks and dangers of serious bodily injury, including permanent disability, paralysis, and death. I agree, on behalf of myself and on behalf of any minor child using the Transport Services for whom I am the parent or legal guardian, that the entire risk arising out of my use of the [employer] remains solely with me, to the maximum extent permitted under applicable law. The [employer] are provided "as is" and "as available." [facility] disclaims all representations and warranties, express, implied or statutory, not expressly set out in these terms, including the implied warranties of merchantability and fitness for a particular purpose. I hereby waive and release [facility], its agents, employees, officers, directors, representatives, insurers, attorneys, assigns, successors, subsidiaries, and affiliates from any and all past, present, or future claims, demands, liabilities, actions, causes of action, or suits of any kind directly or indirectly arising from acceptance and use of the [employer]. I further waive and release [facility] and its affiliates from all present and future liability and responsibility for any injury or death to persons or damages to property caused by or related to the use of the [employer]. I have read this Waiver and Release of Liability, and I understand the terms used in it and their legal significance. This Waiver is freely and voluntarily given with the understanding that my right (as well as the right of any minor child for whom I am the parent or legal guardian using  the [employer]) to legal recourse  against  in connection with the [employer] is knowingly surrendered in return for use of these services.   I attest that I read the consent document to [name], gave Ms. Hargan the opportunity to ask questions and answered the questions asked (if any). I affirm that [name] then provided consent for she's participation in this program.     Abigail [name]

## 2022-12-16 NOTE — Telephone Encounter (Signed)
LVM for Shanika to return call  Also per [name]: ask them to do a UA and culture at their next visit

## 2022-12-16 NOTE — Telephone Encounter (Signed)
LVM advising of approved orders.

## 2022-12-17 NOTE — Patient Outreach (Signed)
  Care Coordination   Follow Up Visit Note   12/16/2022 Name: Abigail [name] MRN: [id number] DOB: 1936-01-07  Abigail [name] is a 87 y.o. year old female who sees Abigail Felts, FNP for primary care. I spoke with  Abigail [name] by phone today.  What matters to the patients health and wellness today?  Patient would like to prevent complications or sepsis from UTI.       Goals Addressed               This Visit's Progress     Patient Stated     I need help with medical transportation (pt-stated)        Care Coordination Interventions: Assessed patient understanding of cancer diagnosis and recommended treatment plan Reviewed upcoming provider appointments and treatment appointments Assessed available transportation to appointments and treatments. Has consistent/reliable transportation: No, cancer center is helping with cancer appointments, otherwise patient does not have transportation Sent [id number] [facility] SDOH care guide referral requesting for assistance to help patient with transportation resources        Other     To avoid complications from reoccurring urinary tract infections        Care Coordination Interventions: Evaluation of current treatment plan related to Urinary frequency and patient's adherence to plan as established by provider Reviewed and discussed with patient she recently completed another round of antibiotics (Cipro) for a reoccurring urinary tract infection Determined patient thought her symptoms had resolved, however she noticed nocturia in the past 48 hours and is fearful the UTI has returned Discussed patient currently has home health via [facility]  Sent secure message to PCP Abigail Felts FNP to advise of patient's symptoms and request an order for a UA and C&S via home health, PCP will provide the order, patient advised Educated patient on the importance of staying well hydrated with water, aiming for 48-64 oz daily unless her doctor otherwise  advises Educated patient on the importance to always report symptoms suggestive of UTI early to help receive early diagnosis and treatment to help avoid sepsis or there complications, patient verbalizes understanding Educated patient while providing rationale on the importance of adding yogurt to her diet daily in order to ingest natural probiotics to help with gut flora Discussed recent PCP referral for Urology follow up, discussed with patient she consulted with Abigail [name] with [facility] during a past inpatient admission and he advised no surgical interventions are available to her due to her cancer and age, nor is lithotripsy and patient stated the Urologist advised her, PCP should continue to monitor and treat Sent secure message to PCP advising of patient's past consultation with Urology and recommendations for PCP to continue to manage         SDOH assessments and interventions completed:  Yes  SDOH Interventions Today    Flowsheet Row Most Recent Value  SDOH Interventions   Transportation Interventions AMB Referral        Care Coordination Interventions:  Yes, provided   Follow up plan: Referral made to [id number] SDOH, for transportation resources Follow up call scheduled for 01/13/23 :30 AM    Encounter Outcome:  Pt. Visit Completed

## 2022-12-19 DIAGNOSIS — C3490 Malignant neoplasm of unspecified part of unspecified bronchus or lung: Secondary | ICD-10-CM | POA: Diagnosis not present

## 2022-12-19 DIAGNOSIS — Z7689 Persons encountering health services in other specified circumstances: Secondary | ICD-10-CM | POA: Diagnosis not present

## 2022-12-22 ENCOUNTER — Telehealth: Payer: Self-pay

## 2022-12-22 NOTE — Telephone Encounter (Signed)
   Telephone encounter was:  Successful.  12/22/2022 Name: Abigail [name] MRN: [id number] DOB: Oct 17, 1935  Abigail [name] is a 87 y.o. year old female who is a primary care patient of [name], FNP . The community resource team was consulted for assistance with Transportation Needs   Care guide performed the following interventions: Patient provided with information about care guide support team and interviewed to confirm resource needs. Conformation for transportation has not been received  Follow Up Plan:  Care guide will follow up with patient by phone over the next day    [facility] Guide, [facility] [phone] [address] [place], [place], [place] [address] Phone: [phone] Email: [email or web address]

## 2022-12-22 NOTE — Telephone Encounter (Signed)
Attempted to call patient about voicemail left by home health nurse- nurse reports UTI symptoms and elevated BP. Called to schedule patient an appointment. Patient did not answer and VM is full.

## 2022-12-23 ENCOUNTER — Ambulatory Visit (INDEPENDENT_AMBULATORY_CARE_PROVIDER_SITE_OTHER): Payer: PPO

## 2022-12-23 ENCOUNTER — Other Ambulatory Visit: Payer: Self-pay | Admitting: Nurse Practitioner

## 2022-12-23 ENCOUNTER — Telehealth: Payer: Self-pay

## 2022-12-23 VITALS — BP 140/80 | HR 77 | Temp 98.5°F | Ht 66.0 in | Wt 140.0 lb

## 2022-12-23 DIAGNOSIS — I5032 Chronic diastolic (congestive) heart failure: Secondary | ICD-10-CM | POA: Diagnosis not present

## 2022-12-23 DIAGNOSIS — F419 Anxiety disorder, unspecified: Secondary | ICD-10-CM | POA: Diagnosis not present

## 2022-12-23 DIAGNOSIS — D509 Iron deficiency anemia, unspecified: Secondary | ICD-10-CM | POA: Diagnosis not present

## 2022-12-23 DIAGNOSIS — N3281 Overactive bladder: Secondary | ICD-10-CM | POA: Diagnosis not present

## 2022-12-23 DIAGNOSIS — I130 Hypertensive heart and chronic kidney disease with heart failure and stage 1 through stage 4 chronic kidney disease, or unspecified chronic kidney disease: Secondary | ICD-10-CM | POA: Diagnosis not present

## 2022-12-23 DIAGNOSIS — N390 Urinary tract infection, site not specified: Secondary | ICD-10-CM

## 2022-12-23 DIAGNOSIS — J449 Chronic obstructive pulmonary disease, unspecified: Secondary | ICD-10-CM | POA: Diagnosis not present

## 2022-12-23 DIAGNOSIS — L89152 Pressure ulcer of sacral region, stage 2: Secondary | ICD-10-CM | POA: Diagnosis not present

## 2022-12-23 DIAGNOSIS — N1832 Chronic kidney disease, stage 3b: Secondary | ICD-10-CM | POA: Diagnosis not present

## 2022-12-23 DIAGNOSIS — E039 Hypothyroidism, unspecified: Secondary | ICD-10-CM | POA: Diagnosis not present

## 2022-12-23 DIAGNOSIS — I25118 Atherosclerotic heart disease of native coronary artery with other forms of angina pectoris: Secondary | ICD-10-CM | POA: Diagnosis not present

## 2022-12-23 DIAGNOSIS — J9611 Chronic respiratory failure with hypoxia: Secondary | ICD-10-CM | POA: Diagnosis not present

## 2022-12-23 DIAGNOSIS — E785 Hyperlipidemia, unspecified: Secondary | ICD-10-CM | POA: Diagnosis not present

## 2022-12-23 DIAGNOSIS — I252 Old myocardial infarction: Secondary | ICD-10-CM | POA: Diagnosis not present

## 2022-12-23 DIAGNOSIS — M1990 Unspecified osteoarthritis, unspecified site: Secondary | ICD-10-CM | POA: Diagnosis not present

## 2022-12-23 DIAGNOSIS — N183 Chronic kidney disease, stage 3 unspecified: Secondary | ICD-10-CM

## 2022-12-23 DIAGNOSIS — D631 Anemia in chronic kidney disease: Secondary | ICD-10-CM | POA: Diagnosis not present

## 2022-12-23 DIAGNOSIS — I4510 Unspecified right bundle-branch block: Secondary | ICD-10-CM | POA: Diagnosis not present

## 2022-12-23 DIAGNOSIS — R339 Retention of urine, unspecified: Secondary | ICD-10-CM | POA: Diagnosis not present

## 2022-12-23 DIAGNOSIS — R7303 Prediabetes: Secondary | ICD-10-CM | POA: Diagnosis not present

## 2022-12-23 DIAGNOSIS — K5790 Diverticulosis of intestine, part unspecified, without perforation or abscess without bleeding: Secondary | ICD-10-CM | POA: Diagnosis not present

## 2022-12-23 DIAGNOSIS — K5900 Constipation, unspecified: Secondary | ICD-10-CM | POA: Diagnosis not present

## 2022-12-23 DIAGNOSIS — R1310 Dysphagia, unspecified: Secondary | ICD-10-CM | POA: Diagnosis not present

## 2022-12-23 DIAGNOSIS — R300 Dysuria: Secondary | ICD-10-CM | POA: Diagnosis not present

## 2022-12-23 DIAGNOSIS — I6529 Occlusion and stenosis of unspecified carotid artery: Secondary | ICD-10-CM | POA: Diagnosis not present

## 2022-12-23 DIAGNOSIS — M419 Scoliosis, unspecified: Secondary | ICD-10-CM | POA: Diagnosis not present

## 2022-12-23 LAB — POCT URINALYSIS DIPSTICK
Bilirubin, UA: NEGATIVE
Glucose, UA: NEGATIVE
Ketones, UA: NEGATIVE
Nitrite, UA: NEGATIVE
Protein, UA: POSITIVE — AB
Spec Grav, UA: 1.01 (ref 1.010–1.025)
Urobilinogen, UA: 0.2 E.U./dL
pH, UA: 7 (ref 5.0–8.0)

## 2022-12-23 MED ORDER — LISINOPRIL 10 MG PO TABS
10.0000 mg | ORAL_TABLET | Freq: Every day | ORAL | 2 refills | Status: DC
Start: 2022-12-23 — End: 2023-03-31

## 2022-12-23 MED ORDER — NITROFURANTOIN MACROCRYSTAL 50 MG PO CAPS: 50.0000 mg | ORAL_CAPSULE | Freq: Every day | ORAL | 0 refills | Status: DC

## 2022-12-23 MED ORDER — LISINOPRIL 10 MG PO TABS: 10.0000 mg | ORAL_TABLET | Freq: Every day | ORAL | 1 refills | Status: DC

## 2022-12-23 NOTE — Telephone Encounter (Signed)
   Telephone encounter was:  Successful.  12/23/2022 Name: Abigail [name] MRN: [id number] DOB: 02/06/1936  [name] is a 87 y.o. year old female who is a primary care patient of [name], FNP . The community resource team was consulted for assistance with Transportation Needs   Care guide performed the following interventions: Patient provided with information about care guide support team and interviewed to confirm resource needs.Providing patient with conformation of transportation 5/1 appointment   Follow Up Plan:  No further follow up planned at this time. The patient has been provided with needed resources.    Lenard Forth [facility] Guide, [facility] [phone] [address] [place], [place], [place] [address] Phone: [phone] Email: [email or web address]

## 2022-12-23 NOTE — Progress Notes (Signed)
Patient presents today for a urine and a BP check, Patient reported having UTI symptoms. Patient reports she thinks she let her oxygen get to low on the way here. Patient currently not on any bp meds due to her bp being low. BP Readings from Last 3 Encounters:  12/23/22 (!) 150/82  11/27/22 134/76  11/20/22 (!) 128/42  Per provider- Restart lisinoprol 10mg , 2w NV f/u.

## 2022-12-24 DIAGNOSIS — J449 Chronic obstructive pulmonary disease, unspecified: Secondary | ICD-10-CM | POA: Diagnosis not present

## 2022-12-24 DIAGNOSIS — Z85118 Personal history of other malignant neoplasm of bronchus and lung: Secondary | ICD-10-CM | POA: Diagnosis not present

## 2022-12-24 DIAGNOSIS — K449 Diaphragmatic hernia without obstruction or gangrene: Secondary | ICD-10-CM | POA: Diagnosis not present

## 2022-12-24 DIAGNOSIS — J9611 Chronic respiratory failure with hypoxia: Secondary | ICD-10-CM | POA: Diagnosis not present

## 2022-12-24 DIAGNOSIS — N183 Chronic kidney disease, stage 3 unspecified: Secondary | ICD-10-CM | POA: Diagnosis not present

## 2022-12-24 LAB — URINE CULTURE

## 2022-12-28 ENCOUNTER — Other Ambulatory Visit: Payer: Self-pay | Admitting: Physician Assistant

## 2022-12-28 DIAGNOSIS — C3490 Malignant neoplasm of unspecified part of unspecified bronchus or lung: Secondary | ICD-10-CM

## 2022-12-29 DIAGNOSIS — M1990 Unspecified osteoarthritis, unspecified site: Secondary | ICD-10-CM | POA: Diagnosis not present

## 2022-12-29 DIAGNOSIS — R7303 Prediabetes: Secondary | ICD-10-CM | POA: Diagnosis not present

## 2022-12-29 DIAGNOSIS — D631 Anemia in chronic kidney disease: Secondary | ICD-10-CM | POA: Diagnosis not present

## 2022-12-29 DIAGNOSIS — I6529 Occlusion and stenosis of unspecified carotid artery: Secondary | ICD-10-CM | POA: Diagnosis not present

## 2022-12-29 DIAGNOSIS — D509 Iron deficiency anemia, unspecified: Secondary | ICD-10-CM | POA: Diagnosis not present

## 2022-12-29 DIAGNOSIS — N1832 Chronic kidney disease, stage 3b: Secondary | ICD-10-CM | POA: Diagnosis not present

## 2022-12-29 DIAGNOSIS — E785 Hyperlipidemia, unspecified: Secondary | ICD-10-CM | POA: Diagnosis not present

## 2022-12-29 DIAGNOSIS — I4510 Unspecified right bundle-branch block: Secondary | ICD-10-CM | POA: Diagnosis not present

## 2022-12-29 DIAGNOSIS — M419 Scoliosis, unspecified: Secondary | ICD-10-CM | POA: Diagnosis not present

## 2022-12-29 DIAGNOSIS — F419 Anxiety disorder, unspecified: Secondary | ICD-10-CM | POA: Diagnosis not present

## 2022-12-29 DIAGNOSIS — R1310 Dysphagia, unspecified: Secondary | ICD-10-CM | POA: Diagnosis not present

## 2022-12-29 DIAGNOSIS — K5900 Constipation, unspecified: Secondary | ICD-10-CM | POA: Diagnosis not present

## 2022-12-29 DIAGNOSIS — I130 Hypertensive heart and chronic kidney disease with heart failure and stage 1 through stage 4 chronic kidney disease, or unspecified chronic kidney disease: Secondary | ICD-10-CM | POA: Diagnosis not present

## 2022-12-29 DIAGNOSIS — L89152 Pressure ulcer of sacral region, stage 2: Secondary | ICD-10-CM | POA: Diagnosis not present

## 2022-12-29 DIAGNOSIS — R339 Retention of urine, unspecified: Secondary | ICD-10-CM | POA: Diagnosis not present

## 2022-12-29 DIAGNOSIS — I252 Old myocardial infarction: Secondary | ICD-10-CM | POA: Diagnosis not present

## 2022-12-29 DIAGNOSIS — I25118 Atherosclerotic heart disease of native coronary artery with other forms of angina pectoris: Secondary | ICD-10-CM | POA: Diagnosis not present

## 2022-12-29 DIAGNOSIS — J449 Chronic obstructive pulmonary disease, unspecified: Secondary | ICD-10-CM | POA: Diagnosis not present

## 2022-12-29 DIAGNOSIS — R300 Dysuria: Secondary | ICD-10-CM | POA: Diagnosis not present

## 2022-12-29 DIAGNOSIS — E039 Hypothyroidism, unspecified: Secondary | ICD-10-CM | POA: Diagnosis not present

## 2022-12-29 DIAGNOSIS — K5790 Diverticulosis of intestine, part unspecified, without perforation or abscess without bleeding: Secondary | ICD-10-CM | POA: Diagnosis not present

## 2022-12-29 DIAGNOSIS — J9611 Chronic respiratory failure with hypoxia: Secondary | ICD-10-CM | POA: Diagnosis not present

## 2022-12-29 DIAGNOSIS — I5032 Chronic diastolic (congestive) heart failure: Secondary | ICD-10-CM | POA: Diagnosis not present

## 2022-12-29 DIAGNOSIS — N3281 Overactive bladder: Secondary | ICD-10-CM | POA: Diagnosis not present

## 2022-12-30 DIAGNOSIS — L821 Other seborrheic keratosis: Secondary | ICD-10-CM | POA: Diagnosis not present

## 2022-12-30 DIAGNOSIS — L448 Other specified papulosquamous disorders: Secondary | ICD-10-CM | POA: Diagnosis not present

## 2022-12-30 DIAGNOSIS — C44619 Basal cell carcinoma of skin of left upper limb, including shoulder: Secondary | ICD-10-CM | POA: Diagnosis not present

## 2022-12-30 DIAGNOSIS — D2272 Melanocytic nevi of left lower limb, including hip: Secondary | ICD-10-CM | POA: Diagnosis not present

## 2022-12-31 DIAGNOSIS — R339 Retention of urine, unspecified: Secondary | ICD-10-CM | POA: Diagnosis not present

## 2022-12-31 DIAGNOSIS — I130 Hypertensive heart and chronic kidney disease with heart failure and stage 1 through stage 4 chronic kidney disease, or unspecified chronic kidney disease: Secondary | ICD-10-CM | POA: Diagnosis not present

## 2022-12-31 DIAGNOSIS — K5900 Constipation, unspecified: Secondary | ICD-10-CM | POA: Diagnosis not present

## 2022-12-31 DIAGNOSIS — I6529 Occlusion and stenosis of unspecified carotid artery: Secondary | ICD-10-CM | POA: Diagnosis not present

## 2022-12-31 DIAGNOSIS — N3281 Overactive bladder: Secondary | ICD-10-CM | POA: Diagnosis not present

## 2022-12-31 DIAGNOSIS — I25118 Atherosclerotic heart disease of native coronary artery with other forms of angina pectoris: Secondary | ICD-10-CM | POA: Diagnosis not present

## 2022-12-31 DIAGNOSIS — J449 Chronic obstructive pulmonary disease, unspecified: Secondary | ICD-10-CM | POA: Diagnosis not present

## 2022-12-31 DIAGNOSIS — I4510 Unspecified right bundle-branch block: Secondary | ICD-10-CM | POA: Diagnosis not present

## 2022-12-31 DIAGNOSIS — R300 Dysuria: Secondary | ICD-10-CM | POA: Diagnosis not present

## 2022-12-31 DIAGNOSIS — I252 Old myocardial infarction: Secondary | ICD-10-CM | POA: Diagnosis not present

## 2022-12-31 DIAGNOSIS — D509 Iron deficiency anemia, unspecified: Secondary | ICD-10-CM | POA: Diagnosis not present

## 2022-12-31 DIAGNOSIS — M419 Scoliosis, unspecified: Secondary | ICD-10-CM | POA: Diagnosis not present

## 2022-12-31 DIAGNOSIS — D631 Anemia in chronic kidney disease: Secondary | ICD-10-CM | POA: Diagnosis not present

## 2022-12-31 DIAGNOSIS — R1310 Dysphagia, unspecified: Secondary | ICD-10-CM | POA: Diagnosis not present

## 2022-12-31 DIAGNOSIS — E785 Hyperlipidemia, unspecified: Secondary | ICD-10-CM | POA: Diagnosis not present

## 2022-12-31 DIAGNOSIS — K5790 Diverticulosis of intestine, part unspecified, without perforation or abscess without bleeding: Secondary | ICD-10-CM | POA: Diagnosis not present

## 2022-12-31 DIAGNOSIS — L89152 Pressure ulcer of sacral region, stage 2: Secondary | ICD-10-CM | POA: Diagnosis not present

## 2022-12-31 DIAGNOSIS — I5032 Chronic diastolic (congestive) heart failure: Secondary | ICD-10-CM | POA: Diagnosis not present

## 2022-12-31 DIAGNOSIS — F419 Anxiety disorder, unspecified: Secondary | ICD-10-CM | POA: Diagnosis not present

## 2022-12-31 DIAGNOSIS — J9611 Chronic respiratory failure with hypoxia: Secondary | ICD-10-CM | POA: Diagnosis not present

## 2022-12-31 DIAGNOSIS — N1832 Chronic kidney disease, stage 3b: Secondary | ICD-10-CM | POA: Diagnosis not present

## 2022-12-31 DIAGNOSIS — M1990 Unspecified osteoarthritis, unspecified site: Secondary | ICD-10-CM | POA: Diagnosis not present

## 2022-12-31 DIAGNOSIS — R7303 Prediabetes: Secondary | ICD-10-CM | POA: Diagnosis not present

## 2022-12-31 DIAGNOSIS — E039 Hypothyroidism, unspecified: Secondary | ICD-10-CM | POA: Diagnosis not present

## 2023-01-06 ENCOUNTER — Ambulatory Visit: Payer: Self-pay

## 2023-01-09 ENCOUNTER — Ambulatory Visit: Payer: PPO

## 2023-01-09 VITALS — BP 124/50 | HR 69 | Temp 98.0°F | Ht 66.0 in | Wt 140.0 lb

## 2023-01-09 DIAGNOSIS — N183 Chronic kidney disease, stage 3 unspecified: Secondary | ICD-10-CM

## 2023-01-09 NOTE — Progress Notes (Signed)
Patient presents today for a bp check.  She is taking lisinopril 10mg . Her bp today is 124/50.  BP Readings from Last 3 Encounters:  12/23/22 (!) 140/80  11/27/22 134/76  11/20/22 (!) 128/42    Per provider pt is to continue with her current medications. She is to keep her next appt. YL,RMA

## 2023-01-13 ENCOUNTER — Ambulatory Visit: Payer: Self-pay

## 2023-01-13 DIAGNOSIS — I10 Essential (primary) hypertension: Secondary | ICD-10-CM

## 2023-01-13 DIAGNOSIS — C3490 Malignant neoplasm of unspecified part of unspecified bronchus or lung: Secondary | ICD-10-CM

## 2023-01-13 NOTE — Patient Instructions (Signed)
Visit Information  Thank you for taking time to visit with me today. Please don't hesitate to contact me if I can be of assistance to you.   Following are the goals we discussed today:   Goals Addressed             This Visit's Progress    To avoid complications from reoccurring urinary tract infections       Care Coordination Interventions: Evaluation of current treatment plan related to Urinary frequency and patient's adherence to plan as established by provider Reviewed and discussed most recent PCP follow up and treatment recommendations for reoccurring UTI's Reviewed medications with patient and discussed the importance of medication adherence, determined PCP prescribed a 3 month course of Nitrofurantoin for treatment and prevention of UTI Educated patient on the importance of staying well hydrated with water to help with hydration and kidney function; educated patient while providing rationale the importance of eating yogurt daily to help preserve good gut flora Instructed patient to keep her doctor well informed of new symptoms or concerns      To manage fatigue while staying physically active       Care Coordination Interventions: Determined patient has resumed going to church on Sunday mornings to help with socialization and resume something joyful in her life Determined patient is experiencing increased fatigue following church, she is having to rest more and sleep more to recuperate  Educated patient on the importance of balancing activity with plenty of rest and to stay well hydrated with plenty of water  Encouraged patient to continue to perform daily activities as tolerated while pacing herself  Discussed patient received resources for Senior activities near her home and may consider participating in the future Positive reinforcement provided to patient for making efforts to stay physically and emotionally healthy            Our next appointment is by telephone on  02/11/23 at 11;00 AM  Please call the care guide team at [phone] if you need to cancel or reschedule your appointment.   If you are experiencing a Mental Health or Behavioral Health Crisis or need someone to talk to, please call 1-800-273-TALK (toll free, 24 hour hotline) go to [facility] [address] [place] [phone])  The patient verbalized understanding of instructions, educational materials, and care plan provided today and DECLINED offer to receive copy of patient instructions, educational materials, and care plan.   Delsa Sale, RN, BSN, CCM Care Management Coordinator [facility] Care Management Direct Phone: [phone]

## 2023-01-13 NOTE — Patient Outreach (Signed)
  Care Coordination   Follow Up Visit Note   01/13/2023 Name: Abigail [name] MRN: [id number] DOB: 06/02/1936  Abigail [name] is a 87 y.o. year old female who sees [name], FNP for primary care. I spoke with  [name] by phone today.  What matters to the patients health and wellness today?  Patient would like to remain free from UTI's. She would like to remain physically active while managing fatigue.     Goals Addressed             This Visit's Progress    To avoid complications from reoccurring urinary tract infections       Care Coordination Interventions: Evaluation of current treatment plan related to Urinary frequency and patient's adherence to plan as established by provider Reviewed and discussed most recent PCP follow up and treatment recommendations for reoccurring UTI's Reviewed medications with patient and discussed the importance of medication adherence, determined PCP prescribed a 3 month course of Nitrofurantoin for treatment and prevention of UTI Educated patient on the importance of staying well hydrated with water to help with hydration and kidney function; educated patient while providing rationale the importance of eating yogurt daily to help preserve good gut flora Instructed patient to keep her doctor well informed of new symptoms or concerns      To manage fatigue while staying physically active       Care Coordination Interventions: Determined patient has resumed going to church on Sunday mornings to help with socialization and resume something joyful in her life Determined patient is experiencing increased fatigue following church, she is having to rest more and sleep more to recuperate  Educated patient on the importance of balancing activity with plenty of rest and to stay well hydrated with plenty of water  Encouraged patient to continue to perform daily activities as tolerated while pacing herself  Discussed patient received resources for Senior  activities near her home and may consider participating in the future Positive reinforcement provided to patient for making efforts to stay physically and emotionally healthy     Interventions Today    Flowsheet Row Most Recent Value  Chronic Disease   Chronic disease during today's visit Other  [chronic UTI]  General Interventions   General Interventions Discussed/Reviewed General Interventions Discussed, General Interventions Reviewed, Labs, Doctor Visits  Doctor Visits Discussed/Reviewed Doctor Visits Discussed, Doctor Visits Reviewed, PCP  Exercise Interventions   Exercise Discussed/Reviewed Physical Activity  Physical Activity Discussed/Reviewed Physical Activity Reviewed, Physical Activity Discussed  Education Interventions   Education Provided Provided Education  Provided Verbal Education On Medication, Labs, When to see the doctor  Nutrition Interventions   Nutrition Discussed/Reviewed Fluid intake, Nutrition Discussed, Nutrition Reviewed  Pharmacy Interventions   Pharmacy Dicussed/Reviewed Medications and their functions, Pharmacy Topics Discussed, Pharmacy Topics Reviewed          SDOH assessments and interventions completed:  Yes  SDOH Interventions Today    Flowsheet Row Most Recent Value  SDOH Interventions   Transportation Interventions AMB Referral        Care Coordination Interventions:  Yes, provided   Follow up plan: Referral made to [id number] [facility] Care Management for resources for transportation  Follow up call scheduled for 02/11/23 @11 :00 AM    Encounter Outcome:  Pt. Visit Completed

## 2023-01-14 ENCOUNTER — Telehealth: Payer: Self-pay

## 2023-01-14 NOTE — Telephone Encounter (Signed)
   Telephone encounter was:  Successful.  01/14/2023 Name: Abigail [name] MRN: [id number] DOB: 07/14/1936  [name] is a 87 y.o. year old female who is a primary care patient of [name], FNP . The community resource team was consulted for assistance with Transportation Needs   Care guide performed the following interventions: Patient provided with information about care guide support team and interviewed to confirm resource needs.Patient stated she needs other transportation resources. We called [employer] to see if she has benifts and they aked me to drop off the call. Patient will call back if there are any other needs   Follow Up Plan:  Care guide will follow up with patient by phone over the next day    [facility] Guide, [facility] [phone] [address] [place], [place], [place] [address] Phone: [phone] Email: [email or web address]

## 2023-01-16 ENCOUNTER — Telehealth: Payer: Self-pay

## 2023-01-16 NOTE — Telephone Encounter (Signed)
   Telephone encounter was:  Successful.  01/16/2023 Name: [name] MRN: [id number] DOB: September 05, 1935  [name] is a 87 y.o. year old female who is a primary care patient of [name], FNP . The community resource team was consulted for assistance with Transportation Needs   Care guide performed the following interventions: Patient provided with information about care guide support team and interviewed to confirm resource needs.Patient has benefits with [employer] for transportation needs. She has no other needs at this time   Follow Up Plan:  No further follow up planned at this time. The patient has been provided with needed resources.   Lenard Forth [facility] Guide, [facility] [phone] [address] [place], [place], [place] [address] Phone: [phone] Email: [email or web address]

## 2023-01-18 DIAGNOSIS — C3490 Malignant neoplasm of unspecified part of unspecified bronchus or lung: Secondary | ICD-10-CM | POA: Diagnosis not present

## 2023-01-21 ENCOUNTER — Telehealth: Payer: Self-pay | Admitting: Physician Assistant

## 2023-01-22 ENCOUNTER — Telehealth: Payer: Self-pay | Admitting: Physician Assistant

## 2023-01-22 NOTE — Telephone Encounter (Signed)
Called patient regarding June appointments, patient is notified. 

## 2023-01-24 DIAGNOSIS — J449 Chronic obstructive pulmonary disease, unspecified: Secondary | ICD-10-CM | POA: Diagnosis not present

## 2023-01-28 ENCOUNTER — Ambulatory Visit (HOSPITAL_COMMUNITY)
Admission: RE | Admit: 2023-01-28 | Discharge: 2023-01-28 | Disposition: A | Discharge status: Home / Self Care | Payer: PPO | Source: Ambulatory Visit | Attending: Physician Assistant | Admitting: Physician Assistant

## 2023-01-28 ENCOUNTER — Inpatient Hospital Stay: Payer: PPO

## 2023-01-28 ENCOUNTER — Inpatient Hospital Stay: Payer: PPO | Attending: Internal Medicine

## 2023-01-28 ENCOUNTER — Other Ambulatory Visit: Payer: PPO

## 2023-01-28 ENCOUNTER — Other Ambulatory Visit: Payer: Self-pay

## 2023-01-28 DIAGNOSIS — C3490 Malignant neoplasm of unspecified part of unspecified bronchus or lung: Secondary | ICD-10-CM

## 2023-01-28 DIAGNOSIS — N200 Calculus of kidney: Secondary | ICD-10-CM | POA: Insufficient documentation

## 2023-01-28 DIAGNOSIS — Z79890 Hormone replacement therapy (postmenopausal): Secondary | ICD-10-CM | POA: Insufficient documentation

## 2023-01-28 DIAGNOSIS — N183 Chronic kidney disease, stage 3 unspecified: Secondary | ICD-10-CM | POA: Insufficient documentation

## 2023-01-28 DIAGNOSIS — D649 Anemia, unspecified: Secondary | ICD-10-CM | POA: Insufficient documentation

## 2023-01-28 DIAGNOSIS — Z85828 Personal history of other malignant neoplasm of skin: Secondary | ICD-10-CM | POA: Insufficient documentation

## 2023-01-28 DIAGNOSIS — E039 Hypothyroidism, unspecified: Secondary | ICD-10-CM | POA: Insufficient documentation

## 2023-01-28 DIAGNOSIS — Z08 Encounter for follow-up examination after completed treatment for malignant neoplasm: Secondary | ICD-10-CM | POA: Insufficient documentation

## 2023-01-28 DIAGNOSIS — Z85118 Personal history of other malignant neoplasm of bronchus and lung: Secondary | ICD-10-CM | POA: Insufficient documentation

## 2023-01-28 DIAGNOSIS — J439 Emphysema, unspecified: Secondary | ICD-10-CM | POA: Diagnosis not present

## 2023-01-28 LAB — CBC WITH DIFFERENTIAL (CANCER CENTER ONLY)
Abs Immature Granulocytes: 0.02 10*3/uL (ref 0.00–0.07)
Basophils Absolute: 0 10*3/uL (ref 0.0–0.1)
Basophils Relative: 1 %
Eosinophils Absolute: 0.2 10*3/uL (ref 0.0–0.5)
Eosinophils Relative: 3 %
HCT: 28.5 % — ABNORMAL LOW (ref 36.0–46.0)
Hemoglobin: 8.9 g/dL — ABNORMAL LOW (ref 12.0–15.0)
Immature Granulocytes: 0 %
Lymphocytes Relative: 5 %
Lymphs Abs: 0.3 10*3/uL — ABNORMAL LOW (ref 0.7–4.0)
MCH: 29.6 pg (ref 26.0–34.0)
MCHC: 31.2 g/dL (ref 30.0–36.0)
MCV: 94.7 fL (ref 80.0–100.0)
Monocytes Absolute: 0.4 10*3/uL (ref 0.1–1.0)
Monocytes Relative: 7 %
Neutro Abs: 4.5 10*3/uL (ref 1.7–7.7)
Neutrophils Relative %: 84 %
Platelet Count: 228 10*3/uL (ref 150–400)
RBC: 3.01 MIL/uL — ABNORMAL LOW (ref 3.87–5.11)
RDW: 15.3 % (ref 11.5–15.5)
WBC Count: 5.4 10*3/uL (ref 4.0–10.5)
nRBC: 0 % (ref 0.0–0.2)

## 2023-01-28 LAB — CMP (CANCER CENTER ONLY)
ALT: 9 U/L (ref 0–44)
AST: 11 U/L — ABNORMAL LOW (ref 15–41)
Albumin: 3.8 g/dL (ref 3.5–5.0)
Alkaline Phosphatase: 98 U/L (ref 38–126)
Anion gap: 7 (ref 5–15)
BUN: 45 mg/dL — ABNORMAL HIGH (ref 8–23)
CO2: 24 mmol/L (ref 22–32)
Calcium: 9.1 mg/dL (ref 8.9–10.3)
Chloride: 107 mmol/L (ref 98–111)
Creatinine: 1.52 mg/dL — ABNORMAL HIGH (ref 0.44–1.00)
GFR, Estimated: 34 mL/min — ABNORMAL LOW (ref >60)
Glucose, Bld: 163 mg/dL — ABNORMAL HIGH (ref 70–99)
Potassium: 4.4 mmol/L (ref 3.5–5.1)
Sodium: 138 mmol/L (ref 135–145)
Total Bilirubin: 0.2 mg/dL — ABNORMAL LOW (ref 0.3–1.2)
Total Protein: 7.6 g/dL (ref 6.5–8.1)

## 2023-01-28 MED ORDER — IOHEXOL 300 MG/ML  SOLN
60.0000 mL | Freq: Once | INTRAMUSCULAR | Status: AC | PRN
  Administered 2023-01-28: 60 mL via INTRAVENOUS

## 2023-01-28 NOTE — Progress Notes (Signed)
[name] OFFICE PROGRESS NOTE  Abigail Felts, FNP [address] [place] [address]  DIAGNOSIS: Stage IIIA (T2a, N2, M0) non-small cell lung cancer, adenocarcinoma presented with left lower lobe lung mass in addition to left hilar and mediastinal lymphadenopathy diagnosed in November 2022 with no actionable mutation on the guardant blood test     PRIOR THERAPY: 1) Weekly concurrent chemoradiation with carboplatin for an AUC of 2 and paclitaxel 45 mg per metered squared.  First dose on 08/05/2021.  Status post 6 cycles.  Last dose was given on September 03, 2021.    2) Consolidation treatment with immunotherapy with Imfinzi 1500 Mg IV every 4 weeks.  Last dose on 09/25/22.  Status post 13 cycles.     CURRENT THERAPY: Observation   INTERVAL HISTORY: Abigail [name] 87 y.o. female returns to the clinic today for a follow-up visit. The patient was last seen by Dr. Arbutus Ped and myself in March 2024. She completed consolidation immunotherapy in February 2024. She is currently on observation. She denies any major changes in her health since last being seen except she is being treated by her PCP for a UTI. She states the antibiotic makes her feel bad. She also has a kidney stone. She saw urology who reportedly told her she was not a candidate for any intervention. She has been trying to drink water. She also mentions she had two skin cancers cut off her left upper arm and left shoulder. She was reportedly told this was secondary to her prior radiation.   Today, she denies any fever, chills, or night sweats. She gets meals on wheels and drinks ensure every day.  She reports stable dyspnea on exertion.  She is on 3 liters of supplemental oxygen via nasal cannula.  She sometimes has a cough which produces clear phlegm which is also unchanged. She has been using uses lozenges if needed. Denies any hemoptysis. The patient does have anemia for which she had stool cards performed which  were positive for blood.  She was evaluated by GI who recommends close monitoring and supportive care with IV iron.  She received IV iron most recently on 05/22/22.   She takes iron supplements. She has hypothyroidism and takes 200 mcg on Saturday-Sunday and takes 175 mcg Mon-Friday. She denies nausea or vomiting. She denies diarrhea. She sometimes struggles with constipation and takes stool softer. She recently had a restaging CT scan performed. She is here today for evaluation to review her scan results.   MEDICAL HISTORY: Past Medical History:  Diagnosis Date   Anemia    Arthritis    knees, back   Carotid artery occlusion    Chronic kidney disease    stage 3 ckd no nephrologist   Complete uterine prolapse with prolapse of anterior vaginal wall    Complication of anesthesia    hard to wake up per pt   Constipation    Coronary artery disease    Diverticulitis yrs ago coialitis   Dyspnea    History of blood transfusion    History of radiation therapy    right lung 08/05/2021-09/18/2021  Dr Antony Blackbird   Hypertension    Hypothyroid    Lung cancer (HCC)    Numbness    in hands at times   Pneumonia    Pre-diabetes    Scoliosis    STEMI (ST elevation myocardial infarction) (HCC) 10/26/2019   DES RCA   Wears dentures    full dentures  Wears glasses    for reading    ALLERGIES:  is allergic to codeine and norvasc [amlodipine].  MEDICATIONS:  Current Outpatient Medications  Medication Sig Dispense Refill   acetaminophen (TYLENOL) 325 MG tablet Take 2 tablets (650 mg total) by mouth every 6 (six) hours as needed for mild pain (or Fever >/= 101).     albuterol (VENTOLIN HFA) 108 (90 Base) MCG/ACT inhaler TAKE 2 PUFFS BY MOUTH EVERY 6 HOURS AS NEEDED FOR WHEEZE OR SHORTNESS OF BREATH 8.5 each 1   aspirin EC 81 MG tablet Take 81 mg by mouth at bedtime.     atorvastatin (LIPITOR) 80 MG tablet Take 1 tablet (80 mg total) by mouth daily at 6 PM. 90 tablet 3    Budeson-Glycopyrrol-Formoterol (BREZTRI AEROSPHERE) 160-9-4.8 MCG/ACT AERO Inhale 2 puffs into the lungs in the morning and at bedtime. 10.7 g 6   cyclobenzaprine (FLEXERIL) 10 MG tablet Take 1 tablet (10 mg total) by mouth 3 (three) times daily as needed for muscle spasms. 30 tablet 1   Dextromethorphan-guaiFENesin (DELSYM C[facility]/CHEST CONG DM CHILD) 5-100 MG/5ML LIQD Take 30 mLs by mouth every 6 (six) hours as needed (cough).     docusate sodium (COLACE) 100 MG capsule Take 100-200 mg by mouth See admin instructions. Take 100 mg in the morning and 200 mg at bedtime     esomeprazole (NEXIUM) 20 MG capsule Take 1 capsule (20 mg total) by mouth 2 (two) times daily before a meal. (Patient not taking: Reported on 11/27/2022) 60 capsule 3   estradiol (ESTRACE) 0.1 MG/GM vaginal cream Place 0.5g twice a week at opening of vagina 42.5 g 11   ezetimibe (ZETIA) 10 MG tablet Take 1 tablet (10 mg total) by mouth daily. 90 tablet 3   ferrous sulfate 325 (65 FE) MG EC tablet TAKE 1 TABLET BY MOUTH EVERY DAY WITH BREAKFAST 30 tablet 2   hydrOXYzine (ATARAX) 10 MG tablet Take 1 tablet (10 mg total) by mouth 3 (three) times daily as needed. 30 tablet 0   levothyroxine (SYNTHROID) 175 MCG tablet TAKE 175 MCG (1 TABLET) BY MOUTH DAILY MONDAY-FRIDAY 30 tablet 2   levothyroxine (SYNTHROID) 200 MCG tablet TAKE 1 TABLET EVERY OTHER DAY. ALTERNATE BETWEEN THE 175 MCG AND THE 200 MCG 30 tablet 1   lisinopril (ZESTRIL) 10 MG tablet Take 1 tablet (10 mg total) by mouth daily. 90 tablet 2   nitrofurantoin (MACRODANTIN) 50 MG capsule Take 1 capsule (50 mg total) by mouth daily. 90 capsule 0   nitroGLYCERIN (NITROSTAT) 0.4 MG SL tablet Place 1 tablet (0.4 mg total) under the tongue every 5 (five) minutes x 3 doses as needed for chest pain. 25 tablet 3   OXYGEN Inhale into the lungs.     prochlorperazine (COMPAZINE) 10 MG tablet Take 1 tablet (10 mg total) by mouth every 6 (six) hours as needed for nausea or vomiting. 30 tablet 2    Throat Lozenges (LOZENGES MT) Use as directed 1 lozenge in the mouth or throat every 4 (four) hours as needed (cough).     Trospium Chloride 60 MG CP24 TAKE 1 CAPSULE BY MOUTH EVERY DAY 90 capsule 3   No current facility-administered medications for this visit.    SURGICAL HISTORY:  Past Surgical History:  Procedure Laterality Date   ANTERIOR AND POSTERIOR REPAIR WITH SACROSPINOUS FIXATION N/A 12/20/2020   Procedure: SACROSPINOUS LIGAMENT FIXATION;  Surgeon: Marguerita Beards, MD;  Location: [name];  Service: Gynecology;  Laterality: N/A;  Total time requested for all procedures is 2 hours   BACK SURGERY  1980 age 35   lower   bartholin cyst removal  age 61's   BLADDER SUSPENSION N/A 12/20/2020   Procedure: TRANSVAGINAL TAPE (TVT) PROCEDURE;  Surgeon: Marguerita Beards, MD;  Location: [facility];  Service: Gynecology;  Laterality: N/A;   BRONCHIAL BRUSHINGS  07/15/2021   Procedure: BRONCHIAL BRUSHINGS;  Surgeon: Leslye [facility], MD;  Location: [facility] ENDOSCOPY;  Service: Pulmonary;;   BRONCHIAL NEEDLE ASPIRATION BIOPSY  07/15/2021   Procedure: BRONCHIAL NEEDLE ASPIRATION BIOPSIES;  Surgeon: Leslye Peer, MD;  Location: [facility] ENDOSCOPY;  Service: Pulmonary;;   CORONARY/GRAFT ACUTE MI REVASCULARIZATION N/A 10/26/2019   Procedure: Coronary/Graft Acute MI Revascularization;  Surgeon: Tonny Bollman, MD;  Location: [facility] INVASIVE CV LAB;  Service: Cardiovascular;  Laterality: N/A;   CYSTOCELE REPAIR N/A 12/20/2020   Procedure: ANTERIOR AND POSTERIOR REPAIR WITH PERINEORRHAPHY;  Surgeon: Marguerita Beards, MD;  Location: [facility];  Service: Gynecology;  Laterality: N/A;   CYSTOSCOPY N/A 12/20/2020   Procedure: CYSTOSCOPY;  Surgeon: Marguerita Beards, MD;  Location: [facility];  Service: Gynecology;  Laterality: N/A;   ELBOW SURGERY  1990's   left   ENDOBRONCHIAL ULTRASOUND N/A 07/15/2021   Procedure: ENDOBRONCHIAL  ULTRASOUND;  Surgeon: Leslye [facility], MD;  Location[name] ENDOSCOPY;  Service: Pulmonary;  Laterality: N/A;   HEMOSTASIS CONTROL  07/15/2021   Procedure: HEMOSTASIS CONTROL;  Surgeon: Leslye Peer, MD;  Location: [facility] ENDOSCOPY;  Service: Pulmonary;;   LEFT HEART CATH AND CORONARY ANGIOGRAPHY N/A 10/26/2019   Procedure: LEFT HEART CATH AND CORONARY ANGIOGRAPHY;  Surgeon: Tonny Bollman, MD;  Location: [name] INVASIVE CV LAB;  Service: Cardiovascular;  Laterality: N/A;   TONSILLECTOMY      REVIEW OF SYSTEMS:   Review of Systems  Constitutional: Positive for stable fatigue and generalized weakness.  Negative for appetite change, chills, fever and unexpected weight loss HENT:   Negative for mouth sores, nosebleeds, sore throat and trouble swallowing.   Eyes: Negative for eye problems and icterus.  Respiratory: Positive for stable dyspnea on exertion and mild intermittent cough. Negative for  hemoptysis and wheezing.     Cardiovascular: Negative for chest pain and leg swelling.  Gastrointestinal: Positive for occasional constipation. Negative for abdominal pain, diarrhea, nausea and vomiting.  Genitourinary: Negative for bladder incontinence, difficulty urinating, dysuria, frequency and hematuria.   Musculoskeletal: Negative for back pain, gait problem, neck pain and neck stiffness.  Skin: Positive for several scattered skin lesions.   Neurological: Negative for dizziness, extremity weakness, gait problem, headaches, light-headedness and seizures.  Hematological: Negative for adenopathy. Does not bruise/bleed easily.  Psychiatric/Behavioral: Negative for confusion, depression and sleep disturbance. The patient is not nervous/anxious.     PHYSICAL EXAMINATION:  Blood pressure (!) 155/60, pulse 63, temperature 97.9 F (36.6 C), temperature source Oral, resp. rate 18, weight 150 lb (68 kg), SpO2 99 %.  ECOG PERFORMANCE STATUS: 2  Physical Exam  Constitutional: Oriented to person, place, and time  and elderly appearing, and in no distress.  HENT:  Head: Normocephalic and atraumatic.  Mouth/Throat: Oropharynx is clear and moist. No oropharyngeal exudate.  Eyes: Conjunctivae are normal. Right eye exhibits no discharge. Left eye exhibits no discharge. No scleral icterus.  Neck: Normal range of motion. Neck supple.  Cardiovascular: Normal rate, regular rhythm, normal heart sounds and intact distal pulses.   Pulmonary/Chest: Effort normal. Quiet breath sounds bilaterally. On 3 L supplemental oxygen. No  respiratory distress. No wheezes. No rales.  Abdominal: Soft. Bowel sounds are normal. Exhibits no distension and no mass. There is no tenderness.  Musculoskeletal: Normal range of motion. Exhibits no edema.  Lymphadenopathy:    No cervical adenopathy.  Neurological: Alert and oriented to person, place, and time. Exhibits muscle wasting. Was examined in the wheelchair.  Skin: Skin is warm and dry. No rash noted. Not diaphoretic. No erythema. No pallor.  Psychiatric: Mood, memory and judgment normal.  Vitals reviewed.  LABORATORY DATA: Lab Results  Component Value Date   WBC 5.4 01/28/2023   HGB 8.9 (L) 01/28/2023   HCT 28.5 (L) 01/28/2023   MCV 94.7 01/28/2023   PLT 228 01/28/2023      Chemistry      Component Value Date/Time   NA 138 01/28/2023 1229   NA 137 08/28/2021 0923   K 4.4 01/28/2023 1229   CL 107 01/28/2023 1229   CO2 24 01/28/2023 1229   BUN 45 (H) 01/28/2023 1229   BUN 20 08/28/2021 0923   CREATININE 1.52 (H) 01/28/2023 1229   CREATININE 1.08 04/18/2014 1516   GLU 104 12/23/2017 1111      Component Value Date/Time   CALCIUM 9.1 01/28/2023 1229   ALKPHOS 98 01/28/2023 1229   AST 11 (L) 01/28/2023 1229   ALT 9 01/28/2023 1229   BILITOT 0.2 (L) 01/28/2023 1229       RADIOGRAPHIC STUDIES:  CT Chest W Contrast  Result Date: 01/31/2023 CLINICAL DATA:  Restaging non-small cell lung cancer. Assess treatment response. * Tracking Code: BO * EXAM: CT CHEST WITH  CONTRAST TECHNIQUE: Multidetector CT imaging of the chest was performed during intravenous contrast administration. RADIATION DOSE REDUCTION: This exam was performed according to the departmental dose-optimization program which includes automated exposure control, adjustment of the mA and/or kV according to patient size and/or use of iterative reconstruction technique. CONTRAST:  60mL OMNIPAQUE IOHEXOL 300 MG/ML  SOLN COMPARISON:  10/23/2022 FINDINGS: Cardiovascular: Heart size is normal. Trace pericardial effusion is identified. Aortic atherosclerosis and extensive coronary artery calcifications. Mediastinum/Nodes: Thyroid gland, trachea demonstrate no significant findings. Circumferential wall thickening involving the mid and distal esophagus is noted with moderate size hiatal hernia. No enlarged axillary, supraclavicular, mediastinal, or hilar lymph nodes. Lungs/Pleura: Emphysema. Unchanged small, chronically loculated left pleural effusion. Masslike architectural distortion, fibrosis and volume loss is again seen within the perihilar left lung. This is unchanged from the previous exam. No specific findings identified to suggest locally recurrent tumor or metastatic disease within the chest. Emphysema. Upper Abdomen: No acute abnormality. The adrenal glands are unremarkable. Multiple left renal calculi are identified with increase caliber of the left renal pelvis measuring 1.4 cm. Arising off the posterior cortex of the interpolar left kidney is an indeterminate kidney lesion which measures 2 cm in 488 Hounsfield units. This was characterized as a benign Bosniak category 2 cyst on recent MRI from 09/27/2022. No follow-up imaging recommended. Adjacent Bosniak class 1 cysts are also noted for which no follow-up imaging is advised. Musculoskeletal: The bones appear osteopenic. Scoliosis and degenerative disc disease noted. No acute or suspicious bone lesions. IMPRESSION: 1. Stable appearance of masslike  architectural distortion, fibrosis and volume loss within the perihilar left lung. No specific findings identified to suggest locally recurrent tumor or metastatic disease within the chest. 2. Unchanged small, chronically loculated left pleural effusion. 3. Circumferential wall thickening involving the mid and distal esophagus with moderate size hiatal hernia. Correlate for any clinical signs or symptoms of esophagitis. 4. Left  renal calculi with increase caliber of the left renal pelvis measuring 1.4 cm. 5. Coronary artery calcifications. Aortic Atherosclerosis (ICD10-I70.0) and Emphysema (ICD10-J43.9). Electronically Signed   By: Signa Kell M.D.   On: 01/31/2023 13:50     ASSESSMENT/PLAN:  This is a very pleasant 87 year old Caucasian female diagnosed with stage IIIa (T2a, N2, M0) non-small cell lung cancer, adenocarcinoma.  The patient presented with a left lower lobe lung mass in addition to left hilar mediastinal lymphadenopathy.  She was diagnosed in November 2022.  The patient did not have any actionable mutations by guardant 360 blood test.    The patient completed a course of concurrent chemotherapy with radiation.  Her chemotherapy is in the form of carboplatin for AUC of 2 and paclitaxel 45 Mg/M2 status post 6 cycles.  She had been tolerating her treatment with concurrent chemoradiation fairly well except for the radiation-induced odynophagia and dysphagia which has since resolved.  Her initial scan showed significant improvement in her disease with 65% reduction in the volume of the left lower lobe nodule.  She continued to have persistent left hilar and infrahilar adenopathy.   The patient recently underwent treatment with consolidation immunotherapy with Imfinzi 1500 Mg IV every 4 weeks.  Status post 13 cycles.  The patient recently had a restaging CT scan performed.  Dr. Arbutus Ped personally and independently reviewed the scan and discussed the results with patient today.  The scan  showed no evidence of disease progression.  Dr. Arbutus Ped recommend that she continue on observation with restaging CT scan of the chest in 4 months.  I have ordered this without contrast due to her chronic kidney disease.  The patient takes 175 mcg of Synthroid on Monday through Fridays.  She takes 200 mcg on Sundays and Mondays.   Her scan continues to mention left renal calculi.  She was reportedly evaluated by [facility] in the past and there were told that she is not a candidate for any intervention such as what sounds like is lithotripsy due to her chronic kidney disease per patient report.  The patient states that the medication that her PCP has her on for chronic UTIs does not agree with her.  We strongly encouraged the patient to follow-up with her urologist or PCP to see if there are any alternatives.  The patient is drinking plenty of fluids/water per patient report.   She will continue taking her iron supplements.  I have added on iron studies to her next lab draw.  The patient has a history of skin cancer.  Encouraged her to continue to follow with her dermatologist for routine skin checks.  The patient was advised to call immediately if she has any concerning symptoms in the interval. The patient voices understanding of current disease status and treatment options and is in agreement with the current care plan. All questions were answered. The patient knows to call the clinic with any problems, questions or concerns. We can certainly see the patient much sooner if necessary     Orders Placed This Encounter  Procedures   CT Chest Wo Contrast    Standing Status:   Future    Standing Expiration Date:   02/02/2024    Order Specific Question:   Preferred imaging location?    Answer:   [facility]   Iron and Iron Binding Capacity ([facility] only)    Standing Status:   Future    Standing Expiration Date:   02/02/2024   Ferritin    Standing  Status:   Future    Standing  Expiration Date:   02/02/2024   CBC with Differential ([name] Only)    Standing Status:   Future    Standing Expiration Date:   02/02/2024   CMP ([name] only)    Standing Status:   Future    Standing Expiration Date:   02/02/2024     Abigail Abraham Adiyah Lame, PA-C 02/02/23  ADDENDUM: Hematology/Oncology Attending: I had a face to face encounter with the patient today.  I reviewed her record, lab, scan and recommended her care plan.  This is a very pleasant 87 years old white female with a stage IIIa non-small cell lung cancer, adenocarcinoma diagnosed in November 2022 status post a course of concurrent chemoradiation with weekly carboplatin and paclitaxel followed by 1 year treatment of consolidation immunotherapy with Imfinzi completed on September 25, 2022. The patient is currently on observation and she is feeling fine with no concerning complaints except for recurrent kidney stone and urinary tract infection treated by her primary care physician.  She also continues to have the baseline shortness of breath secondary to her COPD and previous radiation. The patient had repeat CT scan of the chest performed recently.  I personally and independently reviewed the scan and discussed the result with the patient today. Her scan showed no concerning findings for disease progression. I recommended for her to continue on observation with repeat CT scan of the chest in 4 months. The patient was advised to call immediately if she has any other concerning symptoms in the interval. The total time spent in the appointment was 20 minutes. Disclaimer: This note was dictated with voice recognition software. Similar sounding words can inadvertently be transcribed and may be missed upon review. Lajuana Matte, MD

## 2023-02-02 ENCOUNTER — Other Ambulatory Visit: Payer: Self-pay

## 2023-02-02 ENCOUNTER — Inpatient Hospital Stay: Payer: PPO

## 2023-02-02 ENCOUNTER — Inpatient Hospital Stay (HOSPITAL_BASED_OUTPATIENT_CLINIC_OR_DEPARTMENT_OTHER): Payer: PPO | Admitting: Physician Assistant

## 2023-02-02 VITALS — BP 155/60 | HR 63 | Temp 97.9°F | Resp 18 | Wt 150.0 lb

## 2023-02-02 DIAGNOSIS — C3490 Malignant neoplasm of unspecified part of unspecified bronchus or lung: Secondary | ICD-10-CM

## 2023-02-02 DIAGNOSIS — Z85828 Personal history of other malignant neoplasm of skin: Secondary | ICD-10-CM | POA: Diagnosis not present

## 2023-02-02 DIAGNOSIS — N183 Chronic kidney disease, stage 3 unspecified: Secondary | ICD-10-CM | POA: Diagnosis not present

## 2023-02-02 DIAGNOSIS — Z79890 Hormone replacement therapy (postmenopausal): Secondary | ICD-10-CM | POA: Diagnosis not present

## 2023-02-02 DIAGNOSIS — N200 Calculus of kidney: Secondary | ICD-10-CM | POA: Diagnosis not present

## 2023-02-02 DIAGNOSIS — Z08 Encounter for follow-up examination after completed treatment for malignant neoplasm: Secondary | ICD-10-CM | POA: Diagnosis not present

## 2023-02-02 DIAGNOSIS — E039 Hypothyroidism, unspecified: Secondary | ICD-10-CM | POA: Diagnosis not present

## 2023-02-02 DIAGNOSIS — D649 Anemia, unspecified: Secondary | ICD-10-CM | POA: Diagnosis not present

## 2023-02-02 DIAGNOSIS — Z85118 Personal history of other malignant neoplasm of bronchus and lung: Secondary | ICD-10-CM | POA: Diagnosis not present

## 2023-02-11 ENCOUNTER — Ambulatory Visit: Payer: Self-pay

## 2023-02-11 NOTE — Patient Outreach (Addendum)
  Care Coordination   02/11/2023 Name: MBER GROSHANS MRN: [id number] DOB: 08-10-1936   Care Coordination Outreach Attempts:  An unsuccessful telephone outreach was attempted for a scheduled appointment today.  Follow Up Plan:  Additional outreach attempts will be made to offer the patient care coordination information and services.   Encounter Outcome:  Pt. Request to Call Back   Care Coordination Interventions:  No, not indicated    Delsa Sale, RN, BSN, CCM Care Management Coordinator [facility] Care Management Direct Phone: [phone]

## 2023-02-13 ENCOUNTER — Ambulatory Visit: Payer: Self-pay

## 2023-02-13 NOTE — Patient Outreach (Signed)
  Care Coordination   02/13/2023 Name: [name] MRN: [id number] DOB: 29-Jun-1936   Care Coordination Outreach Attempts:  An unsuccessful telephone outreach was attempted for a scheduled appointment today.  Follow Up Plan:  Additional outreach attempts will be made to offer the patient care coordination information and services.   Encounter Outcome:  No Answer   Care Coordination Interventions:  No, not indicated    Delsa Sale, RN, BSN, CCM Care Management Coordinator [facility] Care Management  Direct Phone: [phone]

## 2023-02-18 DIAGNOSIS — C3490 Malignant neoplasm of unspecified part of unspecified bronchus or lung: Secondary | ICD-10-CM | POA: Diagnosis not present

## 2023-02-23 DIAGNOSIS — J449 Chronic obstructive pulmonary disease, unspecified: Secondary | ICD-10-CM | POA: Diagnosis not present

## 2023-02-25 ENCOUNTER — Ambulatory Visit (INDEPENDENT_AMBULATORY_CARE_PROVIDER_SITE_OTHER): Payer: PPO

## 2023-02-25 ENCOUNTER — Ambulatory Visit (INDEPENDENT_AMBULATORY_CARE_PROVIDER_SITE_OTHER): Payer: PPO | Admitting: Nurse Practitioner

## 2023-02-25 ENCOUNTER — Encounter: Payer: Self-pay | Admitting: Nurse Practitioner

## 2023-02-25 VITALS — BP 122/60 | HR 77 | Temp 97.9°F | Ht 66.0 in | Wt 149.0 lb

## 2023-02-25 VITALS — BP 122/60 | HR 77 | Temp 97.9°F | Ht 66.0 in | Wt 149.2 lb

## 2023-02-25 DIAGNOSIS — C3490 Malignant neoplasm of unspecified part of unspecified bronchus or lung: Secondary | ICD-10-CM

## 2023-02-25 DIAGNOSIS — E039 Hypothyroidism, unspecified: Secondary | ICD-10-CM | POA: Diagnosis not present

## 2023-02-25 DIAGNOSIS — M25562 Pain in left knee: Secondary | ICD-10-CM

## 2023-02-25 DIAGNOSIS — M25561 Pain in right knee: Secondary | ICD-10-CM

## 2023-02-25 DIAGNOSIS — M25531 Pain in right wrist: Secondary | ICD-10-CM | POA: Insufficient documentation

## 2023-02-25 DIAGNOSIS — I5032 Chronic diastolic (congestive) heart failure: Secondary | ICD-10-CM

## 2023-02-25 DIAGNOSIS — Z9981 Dependence on supplemental oxygen: Secondary | ICD-10-CM | POA: Insufficient documentation

## 2023-02-25 DIAGNOSIS — I25118 Atherosclerotic heart disease of native coronary artery with other forms of angina pectoris: Secondary | ICD-10-CM | POA: Diagnosis not present

## 2023-02-25 DIAGNOSIS — J449 Chronic obstructive pulmonary disease, unspecified: Secondary | ICD-10-CM | POA: Diagnosis not present

## 2023-02-25 DIAGNOSIS — R7303 Prediabetes: Secondary | ICD-10-CM | POA: Diagnosis not present

## 2023-02-25 DIAGNOSIS — I129 Hypertensive chronic kidney disease with stage 1 through stage 4 chronic kidney disease, or unspecified chronic kidney disease: Secondary | ICD-10-CM | POA: Diagnosis not present

## 2023-02-25 DIAGNOSIS — I700 Atherosclerosis of aorta: Secondary | ICD-10-CM | POA: Diagnosis not present

## 2023-02-25 DIAGNOSIS — Z0000 Encounter for general adult medical examination without abnormal findings: Secondary | ICD-10-CM | POA: Diagnosis not present

## 2023-02-25 DIAGNOSIS — I130 Hypertensive heart and chronic kidney disease with heart failure and stage 1 through stage 4 chronic kidney disease, or unspecified chronic kidney disease: Secondary | ICD-10-CM | POA: Diagnosis not present

## 2023-02-25 DIAGNOSIS — G8929 Other chronic pain: Secondary | ICD-10-CM

## 2023-02-25 DIAGNOSIS — N183 Chronic kidney disease, stage 3 unspecified: Secondary | ICD-10-CM | POA: Diagnosis not present

## 2023-02-25 DIAGNOSIS — F419 Anxiety disorder, unspecified: Secondary | ICD-10-CM | POA: Diagnosis not present

## 2023-02-25 NOTE — Patient Instructions (Signed)
[name] , Thank you for taking time to come for your Medicare Wellness Visit. I appreciate your ongoing commitment to your health goals. Please review the following plan we discussed and let me know if I can assist you in the future.   These are the goals we discussed:  Goals       I need help with medical transportation (pt-stated)      Care Coordination Interventions: Assessed patient understanding of cancer diagnosis and recommended treatment plan Reviewed upcoming provider appointments and treatment appointments Assessed available transportation to appointments and treatments. Has consistent/reliable transportation: No, cancer center is helping with cancer appointments, otherwise patient does not have transportation Sent [id number] SDOH care guide referral requesting for assistance to help patient with transportation resources        Patient Stated      Wants to have good blood pressure      Patient Stated      01/11/2020, wants to start going back to the gym      Patient Stated      02/13/2021, wants to weigh 140 pounds      Patient Stated      02/19/2022, no goals      Patient Stated      02/25/2023, continue to drink water      To avoid complications from reoccurring urinary tract infections      Care Coordination Interventions: Evaluation of current treatment plan related to Urinary frequency and patient's adherence to plan as established by provider Reviewed and discussed most recent PCP follow up and treatment recommendations for reoccurring UTI's Reviewed medications with patient and discussed the importance of medication adherence, determined PCP prescribed a 3 month course of Nitrofurantoin for treatment and prevention of UTI Educated patient on the importance of staying well hydrated with water to help with hydration and kidney function; educated patient while providing rationale the importance of eating yogurt daily to help preserve good gut flora Instructed patient to  keep her doctor well informed of new symptoms or concerns       To manage fatigue while staying physically active      Care Coordination Interventions: Determined patient has resumed going to church on Sunday mornings to help with socialization and resume something joyful in her life Determined patient is experiencing increased fatigue following church, she is having to rest more and sleep more to recuperate  Educated patient on the importance of balancing activity with plenty of rest and to stay well hydrated with plenty of water  Encouraged patient to continue to perform daily activities as tolerated while pacing herself  Discussed patient received resources for Senior activities near her home and may consider participating in the future Positive reinforcement provided to patient for making efforts to stay physically and emotionally healthy            This is a list of the screening recommended for you and due dates:  Health Maintenance  Topic Date Due   COVID-19 Vaccine (6 - 2023-24 season) 08/25/2023*   Flu Shot  03/26/2023   Medicare Annual Wellness Visit  02/25/2024   DTaP/Tdap/Td vaccine (3 - Td or Tdap) 01/10/2030   Pneumonia Vaccine  Completed   DEXA scan (bone density measurement)  Completed   Zoster (Shingles) Vaccine  Completed   HPV Vaccine  Aged Out  *Topic was postponed. The date shown is not the original due date.    Advanced directives: Advance directive discussed with you today. Even though you declined  this today please call our office should you change your mind and we can give you the proper paperwork for you to fill out.  Conditions/risks identified: none  Next appointment: Follow up in one year for your annual wellness visit    Preventive Care 65 Years and Older, Female Preventive care refers to lifestyle choices and visits with your health care provider that can promote health and wellness. What does preventive care include? A yearly physical exam.  This is also called an annual well check. Dental exams once or twice a year. Routine eye exams. Ask your health care provider how often you should have your eyes checked. Personal lifestyle choices, including: Daily care of your teeth and gums. Regular physical activity. Eating a healthy diet. Avoiding tobacco and drug use. Limiting alcohol use. Practicing safe sex. Taking low-dose aspirin every day. Taking vitamin and mineral supplements as recommended by your health care provider. What happens during an annual well check? The services and screenings done by your health care provider during your annual well check will depend on your age, overall health, lifestyle risk factors, and family history of disease. Counseling  Your health care provider may ask you questions about your: Alcohol use. Tobacco use. Drug use. Emotional well-being. Home and relationship well-being. Sexual activity. Eating habits. History of falls. Memory and ability to understand (cognition). Work and work Astronomer. Reproductive health. Screening  You may have the following tests or measurements: Height, weight, and BMI. Blood pressure. Lipid and cholesterol levels. These may be checked every 5 years, or more frequently if you are over 46 years old. Skin check. Lung cancer screening. You may have this screening every year starting at age 75 if you have a 30-pack-year history of smoking and currently smoke or have quit within the past 15 years. Fecal occult blood test (FOBT) of the stool. You may have this test every year starting at age 3. Flexible sigmoidoscopy or colonoscopy. You may have a sigmoidoscopy every 5 years or a colonoscopy every 10 years starting at age 53. Hepatitis C blood test. Hepatitis B blood test. Sexually transmitted disease (STD) testing. Diabetes screening. This is done by checking your blood sugar (glucose) after you have not eaten for a while (fasting). You may have this done  every 1-3 years. Bone density scan. This is done to screen for osteoporosis. You may have this done starting at age 44. Mammogram. This may be done every 1-2 years. Talk to your health care provider about how often you should have regular mammograms. Talk with your health care provider about your test results, treatment options, and if necessary, the need for more tests. Vaccines  Your health care provider may recommend certain vaccines, such as: Influenza vaccine. This is recommended every year. Tetanus, diphtheria, and acellular pertussis (Tdap, Td) vaccine. You may need a Td booster every 10 years. Zoster vaccine. You may need this after age 4. Pneumococcal 13-valent conjugate (PCV13) vaccine. One dose is recommended after age 49. Pneumococcal polysaccharide (PPSV23) vaccine. One dose is recommended after age 65. Talk to your health care provider about which screenings and vaccines you need and how often you need them. This information is not intended to replace advice given to you by your health care provider. Make sure you discuss any questions you have with your health care provider. Document Released: 09/07/2015 Document Revised: 04/30/2016 Document Reviewed: 06/12/2015 Elsevier Interactive Patient Education  2017 [employer].  Fall Prevention in the Home Falls can cause injuries. They can happen to  people of all ages. There are many things you can do to make your home safe and to help prevent falls. What can I do on the outside of my home? Regularly fix the edges of walkways and driveways and fix any cracks. Remove anything that might make you trip as you walk through a door, such as a raised step or threshold. Trim any bushes or trees on the path to your home. Use bright outdoor lighting. Clear any walking paths of anything that might make someone trip, such as rocks or tools. Regularly check to see if handrails are loose or broken. Make sure that both sides of any steps have  handrails. Any raised decks and porches should have guardrails on the edges. Have any leaves, snow, or ice cleared regularly. Use sand or salt on walking paths during winter. Clean up any spills in your garage right away. This includes oil or grease spills. What can I do in the bathroom? Use night lights. Install grab bars by the toilet and in the tub and shower. Do not use towel bars as grab bars. Use non-skid mats or decals in the tub or shower. If you need to sit down in the shower, use a plastic, non-slip stool. Keep the floor dry. Clean up any water that spills on the floor as soon as it happens. Remove soap buildup in the tub or shower regularly. Attach bath mats securely with double-sided non-slip rug tape. Do not have throw rugs and other things on the floor that can make you trip. What can I do in the bedroom? Use night lights. Make sure that you have a light by your bed that is easy to reach. Do not use any sheets or blankets that are too big for your bed. They should not hang down onto the floor. Have a firm chair that has side arms. You can use this for support while you get dressed. Do not have throw rugs and other things on the floor that can make you trip. What can I do in the kitchen? Clean up any spills right away. Avoid walking on wet floors. Keep items that you use a lot in easy-to-reach places. If you need to reach something above you, use a strong step stool that has a grab bar. Keep electrical cords out of the way. Do not use floor polish or wax that makes floors slippery. If you must use wax, use non-skid floor wax. Do not have throw rugs and other things on the floor that can make you trip. What can I do with my stairs? Do not leave any items on the stairs. Make sure that there are handrails on both sides of the stairs and use them. Fix handrails that are broken or loose. Make sure that handrails are as long as the stairways. Check any carpeting to make sure  that it is firmly attached to the stairs. Fix any carpet that is loose or worn. Avoid having throw rugs at the top or bottom of the stairs. If you do have throw rugs, attach them to the floor with carpet tape. Make sure that you have a light switch at the top of the stairs and the bottom of the stairs. If you do not have them, ask someone to add them for you. What else can I do to help prevent falls? Wear shoes that: Do not have high heels. Have rubber bottoms. Are comfortable and fit you well. Are closed at the toe. Do not wear sandals. If you  use a stepladder: Make sure that it is fully opened. Do not climb a closed stepladder. Make sure that both sides of the stepladder are locked into place. Ask someone to hold it for you, if possible. Clearly mark and make sure that you can see: Any grab bars or handrails. First and last steps. Where the edge of each step is. Use tools that help you move around (mobility aids) if they are needed. These include: Canes. Walkers. Scooters. Crutches. Turn on the lights when you go into a dark area. Replace any light bulbs as soon as they burn out. Set up your furniture so you have a clear path. Avoid moving your furniture around. If any of your floors are uneven, fix them. If there are any pets around you, be aware of where they are. Review your medicines with your doctor. Some medicines can make you feel dizzy. This can increase your chance of falling. Ask your doctor what other things that you can do to help prevent falls. This information is not intended to replace advice given to you by your health care provider. Make sure you discuss any questions you have with your health care provider. Document Released: 06/07/2009 Document Revised: 01/17/2016 Document Reviewed: 09/15/2014 Elsevier Interactive Patient Education  2017 [employer].

## 2023-02-25 NOTE — Progress Notes (Signed)
Abigail [name], CMA,acting as a Neurosurgeon for Abigail Felts, FNP.,have documented all relevant documentation on the behalf of Abigail Felts, FNP,as directed by  Abigail Felts, FNP while in the presence of Abigail Felts, FNP.  Subjective:  Patient ID: Abigail [name] , female    DOB: 1936/08/20 , 87 y.o.   MRN: [id number]  Chief Complaint  Patient presents with   Hypertension    HPI  Patient presents today for a BP and Pre DM check, patient reports compliance with medications. Patient denies any chest pains, SOB, or headaches. Patient reports having a lot of pain in her left side, she reports having several bowel movements yesterday. She reports she ate a peach that was too ripe.   She woke up one month ago with pain to her right wrist and has been having bilateral knee pain. Would like referral to orthopedics. She thinks she may need steroid injections.   She will also have her AWV today with Nickeah LPN  BP Readings from Last 3 Encounters: 02/25/23 : 122/60 02/02/23 : (!) 155/60 01/09/23 : (!) 124/50       Past Medical History:  Diagnosis Date   Anemia    Arthritis    knees, back   Carotid artery occlusion    Chronic kidney disease    stage 3 ckd no nephrologist   Complete uterine prolapse with prolapse of anterior vaginal wall    Complication of anesthesia    hard to wake up per pt   Constipation    Coronary artery disease    Diverticulitis yrs ago coialitis   Dyspnea    History of blood transfusion    History of radiation therapy    right lung 08/05/2021-09/18/2021  Dr Antony Blackbird   Hypertension    Hypothyroid    Lung cancer (HCC)    Numbness    in hands at times   Pneumonia    Pre-diabetes    Scoliosis    STEMI (ST elevation myocardial infarction) (HCC) 10/26/2019   DES RCA   Wears dentures    full dentures   Wears glasses    for reading     Family History  Problem Relation Age of Onset   Hypertension Mother    Colon cancer Neg Hx    Stomach cancer Neg Hx     Esophageal cancer Neg Hx      Current Outpatient Medications:    acetaminophen (TYLENOL) 325 MG tablet, Take 2 tablets (650 mg total) by mouth every 6 (six) hours as needed for mild pain (or Fever >/= 101)., Disp: , Rfl:    albuterol (VENTOLIN HFA) 108 (90 Base) MCG/ACT inhaler, TAKE 2 PUFFS BY MOUTH EVERY 6 HOURS AS NEEDED FOR WHEEZE OR SHORTNESS OF BREATH, Disp: 8.5 each, Rfl: 1   aspirin EC 81 MG tablet, Take 81 mg by mouth at bedtime., Disp: , Rfl:    atorvastatin (LIPITOR) 80 MG tablet, Take 1 tablet (80 mg total) by mouth daily at 6 PM., Disp: 90 tablet, Rfl: 3   Budeson-Glycopyrrol-Formoterol (BREZTRI AEROSPHERE) 160-9-4.8 MCG/ACT AERO, Inhale 2 puffs into the lungs in the morning and at bedtime., Disp: 10.7 g, Rfl: 6   cyclobenzaprine (FLEXERIL) 10 MG tablet, Take 1 tablet (10 mg total) by mouth 3 (three) times daily as needed for muscle spasms., Disp: 30 tablet, Rfl: 1   Dextromethorphan-guaiFENesin (DELSYM CGH/CHEST CONG DM CHILD) 5-100 MG/5ML LIQD, Take 30 mLs by mouth every 6 (six) hours as needed (cough)., Disp: , Rfl:  docusate sodium (COLACE) 100 MG capsule, Take 100-200 mg by mouth See admin instructions. Take 100 mg in the morning and 200 mg at bedtime, Disp: , Rfl:    estradiol (ESTRACE) 0.1 MG/GM vaginal cream, Place 0.5g twice a week at opening of vagina, Disp: 42.5 g, Rfl: 11   ezetimibe (ZETIA) 10 MG tablet, Take 1 tablet (10 mg total) by mouth daily., Disp: 90 tablet, Rfl: 3   ferrous sulfate 325 (65 FE) MG EC tablet, TAKE 1 TABLET BY MOUTH EVERY DAY WITH BREAKFAST, Disp: 30 tablet, Rfl: 2   hydrOXYzine (ATARAX) 10 MG tablet, Take 1 tablet (10 mg total) by mouth 3 (three) times daily as needed., Disp: 30 tablet, Rfl: 0   levothyroxine (SYNTHROID) 175 MCG tablet, TAKE 175 MCG (1 TABLET) BY MOUTH DAILY MONDAY-FRIDAY, Disp: 30 tablet, Rfl: 2   levothyroxine (SYNTHROID) 200 MCG tablet, TAKE 1 TABLET EVERY OTHER DAY. ALTERNATE BETWEEN THE 175 MCG AND THE 200 MCG, Disp: 30  tablet, Rfl: 1   lisinopril (ZESTRIL) 10 MG tablet, Take 1 tablet (10 mg total) by mouth daily., Disp: 90 tablet, Rfl: 2   nitrofurantoin (MACRODANTIN) 50 MG capsule, Take 1 capsule (50 mg total) by mouth daily., Disp: 90 capsule, Rfl: 0   nitroGLYCERIN (NITROSTAT) 0.4 MG SL tablet, Place 1 tablet (0.4 mg total) under the tongue every 5 (five) minutes x 3 doses as needed for chest pain., Disp: 25 tablet, Rfl: 3   OXYGEN, Inhale into the lungs., Disp: , Rfl:    prochlorperazine (COMPAZINE) 10 MG tablet, Take 1 tablet (10 mg total) by mouth every 6 (six) hours as needed for nausea or vomiting., Disp: 30 tablet, Rfl: 2   Throat Lozenges (LOZENGES MT), Use as directed 1 lozenge in the mouth or throat every 4 (four) hours as needed (cough)., Disp: , Rfl:    Trospium Chloride 60 MG CP24, TAKE 1 CAPSULE BY MOUTH EVERY DAY, Disp: 90 capsule, Rfl: 3   Allergies  Allergen Reactions   Codeine Nausea And Vomiting   Norvasc [Amlodipine] Rash and Other (See Comments)    rash     Review of Systems  Constitutional: Negative.  Negative for fatigue.  Respiratory: Negative.    Cardiovascular: Negative.  Negative for chest pain and palpitations.  Gastrointestinal: Negative.  Negative for constipation.  Endocrine: Negative for cold intolerance.  Musculoskeletal:  Positive for arthralgias (wrist).  Neurological: Negative.  Negative for tremors and headaches.     Today's Vitals   02/25/23 1136  BP: 122/60  Pulse: 77  Temp: 97.9 F (36.6 C)  TempSrc: Oral  SpO2: 96%  Weight: 149 lb 3.2 oz (67.7 kg)  Height: 5\' 6"  (1.676 m)  PainSc: 0-No pain   Body mass index is 24.08 kg/m.  Wt Readings from Last 3 Encounters:  02/25/23 149 lb (67.6 kg)  02/25/23 149 lb 3.2 oz (67.7 kg)  02/02/23 150 lb (68 kg)    Lab Results  Component Value Date   TSH 0.857 02/25/2023    Objective:  Physical Exam Vitals reviewed.  Constitutional:      General: She is not in acute distress.    Appearance: Normal  appearance.  Cardiovascular:     Rate and Rhythm: Normal rate and regular rhythm.     Pulses: Normal pulses.     Heart sounds: Normal heart sounds. No murmur heard. Pulmonary:     Effort: Pulmonary effort is normal. No respiratory distress.     Breath sounds: Normal breath sounds. No wheezing.  Comments: Wearing supplemental oxygen Musculoskeletal:        General: No swelling or tenderness.     Right wrist: No tenderness. Normal range of motion.     Left wrist: No tenderness. Normal range of motion.     Comments: Negative phalens and tinels  Skin:    General: Skin is warm and dry.  Neurological:     General: No focal deficit present.     Mental Status: She is alert and oriented to person, place, and time.     Cranial Nerves: No cranial nerve deficit.     Motor: No weakness.  Psychiatric:        Mood and Affect: Mood normal.        Behavior: Behavior normal.        Thought Content: Thought content normal.        Judgment: Judgment normal.         Assessment And Plan:  Benign hypertension with CKD (chronic kidney disease) stage III (HCC) Assessment & Plan: Blood pressure is well controlled, continue current medications  Orders: -     Basic metabolic panel  Prediabetes -     Hemoglobin A1c  Acquired hypothyroidism Assessment & Plan: Thyroid levels are normal.Continue current medications     Orders: -     TSH + free T4  Chronic diastolic CHF (congestive heart failure) (HCC) Assessment & Plan: Continue f/u with Cardiology, currently stable.   Chronic obstructive pulmonary disease, unspecified COPD type (HCC) Assessment & Plan: Continue f/u with Pulmonology   Malignant neoplasm of unspecified part of unspecified bronchus or lung (HCC) Assessment & Plan: Continue f/u with Pulmonology/Oncology   Atherosclerosis of aorta (HCC) Assessment & Plan: Continue statin, tolerating well   Coronary artery disease involving native coronary artery of native heart  with other form of angina pectoris [facility]) Assessment & Plan: Continue f/u with Oncology/Pulmonology   Anxiety Assessment & Plan: Controlled at this time   Right wrist pain Assessment & Plan: Will refer to orthopedics, no deformity  Orders: -     Ambulatory referral to Orthopedic Surgery  Chronic pain of both knees Assessment & Plan: This has progressed will refer to orthopedics  Orders: -     Ambulatory referral to Orthopedic Surgery  Supplemental oxygen dependent    No follow-ups on file.   Patient was given opportunity to ask questions. Patient verbalized understanding of the plan and was able to repeat key elements of the plan. All questions were answered to their satisfaction.    Jeanell Sparrow, FNP, have reviewed all documentation for this visit. The documentation on 02/25/23 for the exam, diagnosis, procedures, and orders are all accurate and complete.   IF YOU HAVE BEEN REFERRED TO A SPECIALIST, IT MAY TAKE 1-2 WEEKS TO SCHEDULE/PROCESS THE REFERRAL. IF YOU HAVE NOT HEARD FROM US/SPECIALIST IN TWO WEEKS, PLEASE GIVE [place] A CALL AT [phone] X [phone].

## 2023-02-25 NOTE — Patient Instructions (Signed)
Referral placed to [facility] allow 7-10 business days.

## 2023-02-25 NOTE — Progress Notes (Signed)
Subjective:   Abigail RAGGS is a 87 y.o. female who presents for Medicare Annual (Subsequent) preventive examination.  Visit Complete: In person    Review of Systems     Cardiac Risk Factors include: advanced age (>33men, >48 women);dyslipidemia;hypertension     Objective:    Today's Vitals   02/25/23 1203  BP: 122/60  Pulse: 77  Temp: 97.9 F (36.6 C)  TempSrc: Oral  SpO2: 96%  Weight: 149 lb (67.6 kg)  Height: 5\' 6"  (1.676 m)   Body mass index is 24.05 kg/m.     02/25/2023   12:12 PM 10/28/2022    3:20 PM 07/31/2022    9:04 AM 06/05/2022   10:42 AM 06/05/2022    9:42 AM 03/13/2022    2:00 PM 02/19/2022   12:04 PM  Advanced Directives  Does Patient Have a Medical Advance Directive? No No No No No No No  Would patient like information on creating a medical advance directive? No - Patient declined No - Patient declined No - Patient declined No - Patient declined No - Patient declined No - Patient declined     Current Medications (verified) Outpatient Encounter Medications as of 02/25/2023  Medication Sig   acetaminophen (TYLENOL) 325 MG tablet Take 2 tablets (650 mg total) by mouth every 6 (six) hours as needed for mild pain (or Fever >/= 101).   albuterol (VENTOLIN HFA) 108 (90 Base) MCG/ACT inhaler TAKE 2 PUFFS BY MOUTH EVERY 6 HOURS AS NEEDED FOR WHEEZE OR SHORTNESS OF BREATH   aspirin EC 81 MG tablet Take 81 mg by mouth at bedtime.   atorvastatin (LIPITOR) 80 MG tablet Take 1 tablet (80 mg total) by mouth daily at 6 PM.   Budeson-Glycopyrrol-Formoterol (BREZTRI AEROSPHERE) 160-9-4.8 MCG/ACT AERO Inhale 2 puffs into the lungs in the morning and at bedtime.   cyclobenzaprine (FLEXERIL) 10 MG tablet Take 1 tablet (10 mg total) by mouth 3 (three) times daily as needed for muscle spasms.   Dextromethorphan-guaiFENesin (DELSYM [facility]/CHEST CONG DM CHILD) 5-100 MG/5ML LIQD Take 30 mLs by mouth every 6 (six) hours as needed (cough).   docusate sodium (COLACE) 100 MG capsule Take  100-200 mg by mouth See admin instructions. Take 100 mg in the morning and 200 mg at bedtime   estradiol (ESTRACE) 0.1 MG/GM vaginal cream Place 0.5g twice a week at opening of vagina   ezetimibe (ZETIA) 10 MG tablet Take 1 tablet (10 mg total) by mouth daily.   ferrous sulfate 325 (65 FE) MG EC tablet TAKE 1 TABLET BY MOUTH EVERY DAY WITH BREAKFAST   hydrOXYzine (ATARAX) 10 MG tablet Take 1 tablet (10 mg total) by mouth 3 (three) times daily as needed.   levothyroxine (SYNTHROID) 175 MCG tablet TAKE 175 MCG (1 TABLET) BY MOUTH DAILY MONDAY-FRIDAY   levothyroxine (SYNTHROID) 200 MCG tablet TAKE 1 TABLET EVERY OTHER DAY. ALTERNATE BETWEEN THE 175 MCG AND THE 200 MCG   lisinopril (ZESTRIL) 10 MG tablet Take 1 tablet (10 mg total) by mouth daily.   nitrofurantoin (MACRODANTIN) 50 MG capsule Take 1 capsule (50 mg total) by mouth daily.   nitroGLYCERIN (NITROSTAT) 0.4 MG SL tablet Place 1 tablet (0.4 mg total) under the tongue every 5 (five) minutes x 3 doses as needed for chest pain.   OXYGEN Inhale into the lungs.   prochlorperazine (COMPAZINE) 10 MG tablet Take 1 tablet (10 mg total) by mouth every 6 (six) hours as needed for nausea or vomiting.   Throat Lozenges (LOZENGES MT)  Use as directed 1 lozenge in the mouth or throat every 4 (four) hours as needed (cough).   Trospium Chloride 60 MG CP24 TAKE 1 CAPSULE BY MOUTH EVERY DAY   No facility-administered encounter medications on file as of 02/25/2023.    Allergies (verified) Codeine and Norvasc [amlodipine]   History: Past Medical History:  Diagnosis Date   Anemia    Arthritis    knees, back   Carotid artery occlusion    Chronic kidney disease    stage 3 ckd no nephrologist   Complete uterine prolapse with prolapse of anterior vaginal wall    Complication of anesthesia    hard to wake up per pt   Constipation    Coronary artery disease    Diverticulitis yrs ago coialitis   Dyspnea    History of blood transfusion    History of  radiation therapy    right lung 08/05/2021-09/18/2021  Dr Antony Blackbird   Hypertension    Hypothyroid    Lung cancer (HCC)    Numbness    in hands at times   Pneumonia    Pre-diabetes    Scoliosis    STEMI (ST elevation myocardial infarction) (HCC) 10/26/2019   DES RCA   Wears dentures    full dentures   Wears glasses    for reading   Past Surgical History:  Procedure Laterality Date   ANTERIOR AND POSTERIOR REPAIR WITH SACROSPINOUS FIXATION N/A 12/20/2020   Procedure: SACROSPINOUS LIGAMENT FIXATION;  Surgeon: Marguerita Beards, MD;  Location: [facility];  Service: Gynecology;  Laterality: N/A;  Total time requested for all procedures is 2 hours   BACK SURGERY  1980 age 67   lower   bartholin cyst removal  age 7's   BLADDER SUSPENSION N/A 12/20/2020   Procedure: TRANSVAGINAL TAPE (TVT) PROCEDURE;  Surgeon: Marguerita Beards, MD;  Location[facility];  Service: Gynecology;  Laterality: N/A;   BRONCHIAL BRUSHINGS  07/15/2021   Procedure: BRONCHIAL BRUSHINGS;  Surgeon: Leslye Peer, MD;  Location: [facility] ENDOSCOPY;  Service: Pulmonary;;   BRONCHIAL NEEDLE ASPIRATION BIOPSY  07/15/2021   Procedure: BRONCHIAL NEEDLE ASPIRATION BIOPSIES;  Surgeon: Leslye Peer, MD;  Location: [facility] ENDOSCOPY;  Service: Pulmonary;;   CORONARY/GRAFT ACUTE MI REVASCULARIZATION N/A 10/26/2019   Procedure: Coronary/Graft Acute MI Revascularization;  Surgeon: Tonny Bollman, MD;  Location: [facility] INVASIVE CV LAB;  Service: Cardiovascular;  Laterality: N/A;   CYSTOCELE REPAIR N/A 12/20/2020   Procedure: ANTERIOR AND POSTERIOR REPAIR WITH PERINEORRHAPHY;  Surgeon: Marguerita Beards, MD;  Location: [facility];  Service: Gynecology;  Laterality: N/A;   CYSTOSCOPY N/A 12/20/2020   Procedure: CYSTOSCOPY;  Surgeon: Marguerita Beards, MD;  Location: [facility];  Service: Gynecology;  Laterality: N/A;   ELBOW SURGERY  1990's   left    ENDOBRONCHIAL ULTRASOUND N/A 07/15/2021   Procedure: ENDOBRONCHIAL ULTRASOUND;  Surgeon: Leslye Peer, MD;  Location: [facility] ENDOSCOPY;  Service: Pulmonary;  Laterality: N/A;   HEMOSTASIS CONTROL  07/15/2021   Procedure: HEMOSTASIS CONTROL;  Surgeon: Leslye Peer, MD;  Location: [facility] ENDOSCOPY;  Service: Pulmonary;;   LEFT HEART CATH AND CORONARY ANGIOGRAPHY N/A 10/26/2019   Procedure: LEFT HEART CATH AND CORONARY ANGIOGRAPHY;  Surgeon: Tonny Bollman, MD;  Location: [facility] INVASIVE CV LAB;  Service: Cardiovascular;  Laterality: N/A;   TONSILLECTOMY     Family History  Problem Relation Age of Onset   Hypertension Mother    Colon cancer Neg Hx  Stomach cancer Neg Hx    Esophageal cancer Neg Hx    Social History   Socioeconomic History   Marital status: Widowed    Spouse name: Not on file   Number of children: Not on file   Years of education: Not on file   Highest education level: Not on file  Occupational History   Occupation: semi retired  Tobacco Use   Smoking status: Former    Packs/day: 1.50    Years: 35.00    Additional pack years: 0.00    Total pack years: 52.50    Types: Cigarettes    Quit date: 04/05/1986    Years since quitting: 36.9   Smokeless tobacco: Never  Vaping Use   Vaping Use: Never used  Substance and Sexual Activity   Alcohol use: No   Drug use: No   Sexual activity: Not Currently    Birth control/protection: Post-menopausal  Other Topics Concern   Not on file  Social History Narrative   Not on file   Social Determinants of Health   Financial Resource Strain: Low Risk  (02/25/2023)   Overall Financial Resource Strain (CARDIA)    Difficulty of Paying Living Expenses: Not hard at all  Food Insecurity: No Food Insecurity (02/25/2023)   Hunger Vital Sign    Worried About Running Out of Food in the Last Year: Never true    Ran Out of Food in the Last Year: Never true  Transportation Needs: Unmet Transportation Needs (02/25/2023)   PRAPARE -  Transportation    Lack of Transportation (Medical): Yes    Lack of Transportation (Non-Medical): Yes  Physical Activity: Insufficiently Active (02/25/2023)   Exercise Vital Sign    Days of Exercise per Week: 1 day    Minutes of Exercise per Session: 60 min  Stress: No Stress Concern Present (02/25/2023)   [employer] of Occupational Health - Occupational Stress Questionnaire    Feeling of Stress : Not at all  Social Connections: Unknown (02/25/2023)   Social Connection and Isolation Panel [NHANES]    Frequency of Communication with Friends and Family: Twice a week    Frequency of Social Gatherings with Friends and Family: Once a week    Attends Religious Services: More than 4 times per year    Active Member of [employer] or Organizations: No    Attends Engineer, structural: Never    Marital Status: Not on file    Tobacco Counseling Counseling given: Not Answered   Clinical Intake:  Pre-visit preparation completed: Yes  Pain : No/denies pain     Nutritional Status: BMI of 19-24  Normal Nutritional Risks: Nausea/ vomitting/ diarrhea (nausea this morning, but better now) Diabetes: No  How often do you need to have someone help you when you read instructions, pamphlets, or other written materials from your doctor or pharmacy?: 1 - Never  Interpreter Needed?: No  Information entered by :: NAllen LPN   Activities of Daily Living    02/25/2023   12:05 PM  In your present state of health, do you have any difficulty performing the following activities:  Hearing? 0  Vision? 0  Difficulty concentrating or making decisions? 0  Walking or climbing stairs? 1  Comment due to knees  Dressing or bathing? 0  Doing errands, shopping? 0  Preparing Food and eating ? N  Using the Toilet? N  In the past six months, have you accidently leaked urine? N  Do you have problems with loss of bowel control? N  Managing your Medications? N  Managing your Finances? N  Housekeeping or  managing your Housekeeping? N    Patient Care Team: [name], FNP as PCP - General (General Practice) Corky Crafts, MD as PCP - Cardiology (Cardiology) Clarene Duke, Karma Lew, RN as [facility] Care Management  Indicate any recent Medical Services you may have received from other than [facility] providers in the past year (date may be approximate).     Assessment:   This is a routine wellness examination for [facility].  Hearing/Vision screen Hearing Screening - Comments:: Denies hearing issues Vision Screening - Comments:: No regular eye exams  Dietary issues and exercise activities discussed:     Goals Addressed             This Visit's Progress    Patient Stated       02/25/2023, continue to drink water       Depression Screen    02/25/2023   12:14 PM 11/20/2022    8:29 AM 09/11/2022   10:57 AM 07/22/2022    8:49 AM 02/19/2022   12:05 PM 02/13/2022    5:28 PM 02/13/2021   10:50 AM  PHQ 2/9 Scores  PHQ - 2 Score 1 0 0 0 0 0 0  PHQ- 9 Score 2     0     Fall Risk    02/25/2023   12:12 PM 02/25/2023   11:36 AM 11/20/2022    8:28 AM 09/11/2022   10:57 AM 07/22/2022    8:44 AM  Fall Risk   Falls in the past year? 0 0 0 0 0  Number falls in past yr: 0 0  0 0  Injury with Fall? 0 0  0 0  Risk for fall due to : Medication side effect No Fall Risks  No Fall Risks Impaired balance/gait;Impaired mobility  Follow up Falls prevention discussed;Falls evaluation completed Falls evaluation completed  Falls evaluation completed Falls evaluation completed    MEDICARE RISK AT HOME:  Medicare Risk at Home - 02/25/23 1213     Any stairs in or around the home? No    If so, are there any without handrails? No    Home free of loose throw rugs in walkways, pet beds, electrical cords, etc? Yes    Adequate lighting in your home to reduce risk of falls? Yes    Life alert? No    Use of a cane, walker or w/c? Yes    Grab bars in the bathroom? Yes    Shower chair or bench in  shower? No    Elevated toilet seat or a handicapped toilet? Yes             TIMED UP AND GO:  Was the test performed?  Yes  Length of time to ambulate 10 feet: 7 sec Gait slow and steady with assistive device    Cognitive Function:        02/25/2023   12:15 PM 02/19/2022   12:08 PM 02/13/2021   10:51 AM 01/11/2020    8:58 AM 12/29/2018   11:20 AM  6CIT Screen  What Year? 0 points 0 points 0 points 0 points 0 points  What month? 0 points 0 points 0 points 0 points 0 points  What time? 3 points 0 points 0 points 0 points 3 points  Count back from 20 0 points 0 points 0 points 0 points 0 points  Months in reverse 0 points 0 points 0 points 0 points 0  points  Repeat phrase 4 points 2 points 2 points 0 points 0 points  Total Score 7 points 2 points 2 points 0 points 3 points    Immunizations Immunization History  Administered Date(s) Administered   Influenza Nasal 06/28/2019   Influenza, High Dose Seasonal PF 06/18/2018, 07/14/2021, 05/29/2022   Influenza-Unspecified 05/14/2015, 04/25/2017, 06/19/2018   PFIZER(Purple Top)SARS-COV-2 Vaccination 12/10/2019, 01/02/2020, 07/10/2020, 01/11/2021   Pfizer Covid-19 Vaccine Bivalent Booster 70yrs & up 05/29/2022   Pneumococcal Conjugate-13 09/01/2019   Pneumococcal Polysaccharide-23 11/26/2021   Pneumococcal-Unspecified 08/24/2012   Tdap 02/17/2008, 01/11/2020   Zoster Recombinant(Shingrix) 06/28/2019, 10/05/2019    TDAP status: Up to date  Flu Vaccine status: Up to date  Pneumococcal vaccine status: Up to date  Covid-19 vaccine status: Completed vaccines  Qualifies for Shingles Vaccine? Yes   Zostavax completed Yes   Shingrix Completed?: Yes  Screening Tests Health Maintenance  Topic Date Due   COVID-19 Vaccine (6 - 2023-24 season) 08/25/2023 (Originally 07/24/2022)   INFLUENZA VACCINE  03/26/2023   Medicare Annual Wellness (AWV)  02/25/2024   DTaP/Tdap/Td (3 - Td or Tdap) 01/10/2030   Pneumonia Vaccine 59+ Years old   Completed   DEXA SCAN  Completed   Zoster Vaccines- Shingrix  Completed   HPV VACCINES  Aged Out    Health Maintenance  There are no preventive care reminders to display for this patient.   Colorectal cancer screening: No longer required.   Mammogram status: No longer required due to age.  Bone Density status: Completed 04/08/2002.   Lung Cancer Screening: (Low Dose CT Chest recommended if Age 74-80 years, 20 pack-year currently smoking OR have quit w/in 15years.) does not qualify.   Lung Cancer Screening Referral: no  Additional Screening:  Hepatitis C Screening: does not qualify;  Vision Screening: Recommended annual ophthalmology exams for early detection of glaucoma and other disorders of the eye. Is the patient up to date with their annual eye exam?  No  Who is the provider or what is the name of the office in which the patient attends annual eye exams? none If pt is not established with a provider, would they like to be referred to a provider to establish care? No .   Dental Screening: Recommended annual dental exams for proper oral hygiene  Diabetic Foot Exam: n/a  Community Resource Referral / Chronic Care Management: CRR required this visit?  No   CCM required this visit?  No     Plan:     I have personally reviewed and noted the following in the patient's chart:   Medical and social history Use of alcohol, tobacco or illicit drugs  Current medications and supplements including opioid prescriptions. Patient is not currently taking opioid prescriptions. Functional ability and status Nutritional status Physical activity Advanced directives List of other physicians Hospitalizations, surgeries, and ER visits in previous 12 months Vitals Screenings to include cognitive, depression, and falls Referrals and appointments  In addition, I have reviewed and discussed with patient certain preventive protocols, quality metrics, and best practice recommendations.  A written personalized care plan for preventive services as well as general preventive health recommendations were provided to patient.     Barb Merino, LPN   03/02/2955   After Visit Summary: in person  Nurse Notes: none

## 2023-02-26 ENCOUNTER — Other Ambulatory Visit: Payer: Self-pay

## 2023-02-26 ENCOUNTER — Encounter: Payer: Self-pay | Admitting: Internal Medicine

## 2023-02-26 LAB — BASIC METABOLIC PANEL
BUN/Creatinine Ratio: 18 (ref 12–28)
BUN: 30 mg/dL — ABNORMAL HIGH (ref 8–27)
CO2: 23 mmol/L (ref 20–29)
Calcium: 9.1 mg/dL (ref 8.7–10.3)
Chloride: 98 mmol/L (ref 96–106)
Creatinine, Ser: 1.68 mg/dL — ABNORMAL HIGH (ref 0.57–1.00)
Glucose: 114 mg/dL — ABNORMAL HIGH (ref 70–99)
Potassium: 5.2 mmol/L (ref 3.5–5.2)
Sodium: 133 mmol/L — ABNORMAL LOW (ref 134–144)
eGFR: 30 mL/min/{1.73_m2} — ABNORMAL LOW (ref >59)

## 2023-02-26 LAB — TSH+FREE T4
Free T4: 1.51 ng/dL (ref 0.82–1.77)
TSH: 0.857 u[IU]/mL (ref 0.450–4.500)

## 2023-02-26 LAB — HEMOGLOBIN A1C
Est. average glucose Bld gHb Est-mCnc: 123 mg/dL
Hgb A1c MFr Bld: 5.9 % — ABNORMAL HIGH (ref 4.8–5.6)

## 2023-03-04 ENCOUNTER — Telehealth: Payer: Self-pay

## 2023-03-04 NOTE — Telephone Encounter (Signed)
Patient's OT called and states she was having very bad stomach pains - patient was called to see if she would like to come in, patient reports "everytime I have a pain I have to come in... I can't do that for every pain" Patient declined appointment and states she will call back next week if she is not better.

## 2023-03-11 NOTE — Assessment & Plan Note (Signed)
Continue statin, tolerating well 

## 2023-03-11 NOTE — Assessment & Plan Note (Signed)
Controlled at this time

## 2023-03-11 NOTE — Assessment & Plan Note (Signed)
Continue f/u with Pulmonology.  

## 2023-03-11 NOTE — Assessment & Plan Note (Signed)
Continue f/u with Oncology/Pulmonology

## 2023-03-11 NOTE — Assessment & Plan Note (Signed)
This has progressed will refer to orthopedics

## 2023-03-11 NOTE — Assessment & Plan Note (Signed)
Will refer to orthopedics, no deformity

## 2023-03-11 NOTE — Assessment & Plan Note (Signed)
Continue f/u with Cardiology, currently stable.

## 2023-03-11 NOTE — Assessment & Plan Note (Signed)
Thyroid levels are normal.  Continue current medications.

## 2023-03-11 NOTE — Assessment & Plan Note (Signed)
Continue f/u with Pulmonology/Oncology

## 2023-03-11 NOTE — Assessment & Plan Note (Signed)
Blood pressure is well controlled, continue current medications.

## 2023-03-19 ENCOUNTER — Ambulatory Visit: Payer: Self-pay

## 2023-03-19 NOTE — Patient Outreach (Addendum)
  Care Coordination   03/19/2023 Name: Abigail [name] MRN: [id number] DOB: 1936-03-06   Care Coordination Outreach Attempts:  An unsuccessful telephone outreach was attempted for a scheduled appointment today.  Follow Up Plan:  Additional outreach attempts will be made to offer the patient care coordination information and services.   Encounter Outcome:  Pt. Request to Call Back   Care Coordination Interventions:  No, not indicated    Delsa Sale, RN, BSN, CCM Care Management Coordinator [facility] Care Management Direct Phone: [phone]

## 2023-03-20 DIAGNOSIS — J9610 Chronic respiratory failure, unspecified whether with hypoxia or hypercapnia: Secondary | ICD-10-CM | POA: Diagnosis not present

## 2023-03-20 DIAGNOSIS — C3490 Malignant neoplasm of unspecified part of unspecified bronchus or lung: Secondary | ICD-10-CM | POA: Diagnosis not present

## 2023-03-20 DIAGNOSIS — J449 Chronic obstructive pulmonary disease, unspecified: Secondary | ICD-10-CM | POA: Diagnosis not present

## 2023-03-21 ENCOUNTER — Other Ambulatory Visit: Payer: Self-pay | Admitting: Nurse Practitioner

## 2023-03-21 DIAGNOSIS — I129 Hypertensive chronic kidney disease with stage 1 through stage 4 chronic kidney disease, or unspecified chronic kidney disease: Secondary | ICD-10-CM

## 2023-03-21 DIAGNOSIS — E039 Hypothyroidism, unspecified: Secondary | ICD-10-CM

## 2023-03-23 ENCOUNTER — Other Ambulatory Visit: Payer: Self-pay | Admitting: Physician Assistant

## 2023-03-23 DIAGNOSIS — C3490 Malignant neoplasm of unspecified part of unspecified bronchus or lung: Secondary | ICD-10-CM

## 2023-03-24 ENCOUNTER — Other Ambulatory Visit (INDEPENDENT_AMBULATORY_CARE_PROVIDER_SITE_OTHER): Payer: PPO | Admitting: Nurse Practitioner

## 2023-03-24 DIAGNOSIS — N3000 Acute cystitis without hematuria: Secondary | ICD-10-CM | POA: Diagnosis not present

## 2023-03-24 DIAGNOSIS — I5032 Chronic diastolic (congestive) heart failure: Secondary | ICD-10-CM | POA: Diagnosis not present

## 2023-03-24 DIAGNOSIS — N1832 Chronic kidney disease, stage 3b: Secondary | ICD-10-CM | POA: Diagnosis not present

## 2023-03-24 DIAGNOSIS — I25118 Atherosclerotic heart disease of native coronary artery with other forms of angina pectoris: Secondary | ICD-10-CM

## 2023-03-24 DIAGNOSIS — R339 Retention of urine, unspecified: Secondary | ICD-10-CM

## 2023-03-24 DIAGNOSIS — I130 Hypertensive heart and chronic kidney disease with heart failure and stage 1 through stage 4 chronic kidney disease, or unspecified chronic kidney disease: Secondary | ICD-10-CM | POA: Diagnosis not present

## 2023-03-24 DIAGNOSIS — N3281 Overactive bladder: Secondary | ICD-10-CM | POA: Diagnosis not present

## 2023-03-24 NOTE — Progress Notes (Signed)
Chief Complaint  Patient presents with   home care recert   Received home health orders orders from [facility]. Start of care 11/24/2022.   Certification and orders from 01/23/2023 through 03/23/2023 are reviewed, signed and faxed back to home health company.  Need of intermittent skilled services at home: SN, PT  The home health care plan has been established by me and will be reviewed and updated as needed to maximize patient recovery.  I certify that all home health services have been and will be furnished to the patient while under my care.  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

## 2023-03-30 ENCOUNTER — Encounter: Payer: Self-pay | Admitting: Pharmacist

## 2023-03-30 NOTE — Progress Notes (Signed)
Pharmacy Quality Measure Review  This patient is appearing on a report for being at risk of failing the adherence measure for hypertension (ACEi/ARB) medications this calendar year.   Medication: lisinopril 10 mg  Last fill date: 7/24 for 90 day supply  Insurance report was not up to date. No action needed at this time.   Catie Eppie Gibson, PharmD, BCACP, CPP Clinical Pharmacist [facility] [phone]

## 2023-04-01 ENCOUNTER — Ambulatory Visit: Payer: Self-pay

## 2023-04-01 NOTE — Patient Instructions (Signed)
Visit Information  Thank you for taking time to visit with me today. Please don't hesitate to contact me if I can be of assistance to you.   Following are the goals we discussed today:   Goals Addressed             This Visit's Progress    To avoid complications from reoccurring urinary tract infections       Care Coordination Interventions: Evaluation of current treatment plan related to Urinary frequency and patient's adherence to plan as established by provider Discussed with patient she will complete the 90 day course of antibiotics over the next 4 days Determined patient remains to be asymptomatic since starting the prophylactic antibiotics Discussed patient is experiencing constipation since starting the antibiotic, she was able to get relief and have a bowel movement after drinking warm prune juice  Re-educated patient re: signs/symptoms suggestive of UTI and instructed patient to report any/all symptoms early in order to get early treatment  Educated regarding daily water intake, aim for 48-64 oz daily         Our next appointment is by telephone on 06/08/23 at 12:30 PM   Please call the care guide team at [phone] if you need to cancel or reschedule your appointment.   If you are experiencing a Mental Health or Behavioral Health Crisis or need someone to talk to, please call 1-800-273-TALK (toll free, 24 hour hotline)  The patient verbalized understanding of instructions, educational materials, and care plan provided today and DECLINED offer to receive copy of patient instructions, educational materials, and care plan.

## 2023-04-01 NOTE — Patient Outreach (Signed)
  Care Coordination   Follow Up Visit Note   04/01/2023 Name: Abigail [name] MRN: [id number] DOB: Feb 21, 1936  Abigail [name] is a 87 y.o. year old female who sees [name], FNP for primary care. I spoke with  [name] by phone today.  What matters to the patients health and wellness today?  Patient is concerned about having constipation since taking an antibiotic.     Goals Addressed             This Visit's Progress    To avoid complications from reoccurring urinary tract infections       Care Coordination Interventions: Evaluation of current treatment plan related to Urinary frequency and patient's adherence to plan as established by provider Discussed with patient she will complete the 90 day course of antibiotics over the next 4 days Determined patient remains to be asymptomatic since starting the prophylactic antibiotics Discussed patient is experiencing constipation since starting the antibiotic, she was able to get relief and have a bowel movement after drinking warm prune juice  Re-educated patient re: signs/symptoms suggestive of UTI and instructed patient to report any/all symptoms early in order to get early treatment  Educated regarding daily water intake, aim for 48-64 oz daily     Interventions Today    Flowsheet Row Most Recent Value  Chronic Disease   Chronic disease during today's visit Other  [constipation,  UTI]  General Interventions   General Interventions Discussed/Reviewed General Interventions Discussed, General Interventions Reviewed, Doctor Visits  Doctor Visits Discussed/Reviewed Doctor Visits Discussed, Doctor Visits Reviewed, PCP  Education Interventions   Education Provided Provided Education  Provided Verbal Education On When to see the doctor, Nutrition  Nutrition Interventions   Nutrition Discussed/Reviewed Nutrition Discussed, Nutrition Reviewed, Fluid intake  Pharmacy Interventions   Pharmacy Dicussed/Reviewed Pharmacy Topics Discussed,  Pharmacy Topics Reviewed, Medications and their functions          SDOH assessments and interventions completed:  No     Care Coordination Interventions:  Yes, provided   Follow up plan: Follow up call scheduled for 06/08/23 @12 :30 PM    Encounter Outcome:  Pt. Visit Completed

## 2023-04-07 ENCOUNTER — Encounter: Payer: Self-pay | Admitting: Nurse Practitioner

## 2023-04-07 ENCOUNTER — Other Ambulatory Visit: Payer: PPO

## 2023-04-07 ENCOUNTER — Ambulatory Visit (INDEPENDENT_AMBULATORY_CARE_PROVIDER_SITE_OTHER): Payer: PPO | Admitting: Nurse Practitioner

## 2023-04-07 VITALS — BP 150/70 | HR 88 | Temp 98.1°F | Ht 66.0 in | Wt 149.0 lb

## 2023-04-07 DIAGNOSIS — R3121 Asymptomatic microscopic hematuria: Secondary | ICD-10-CM

## 2023-04-07 DIAGNOSIS — N3001 Acute cystitis with hematuria: Secondary | ICD-10-CM | POA: Diagnosis not present

## 2023-04-07 DIAGNOSIS — R339 Retention of urine, unspecified: Secondary | ICD-10-CM

## 2023-04-07 LAB — POCT URINALYSIS DIPSTICK
Bilirubin, UA: NEGATIVE
Glucose, UA: NEGATIVE
Ketones, UA: NEGATIVE
Nitrite, UA: POSITIVE
Protein, UA: POSITIVE — AB
Spec Grav, UA: <=1.005 — AB (ref 1.010–1.025)
Urobilinogen, UA: 0.2 E.U./dL
pH, UA: 7 (ref 5.0–8.0)

## 2023-04-07 MED ORDER — SULFAMETHOXAZOLE-TRIMETHOPRIM 400-80 MG PO TABS
1.0000 | ORAL_TABLET | Freq: Two times a day (BID) | ORAL | 0 refills | Status: AC
Start: 2023-04-07 — End: 2023-04-17

## 2023-04-07 MED ORDER — CEFTRIAXONE SODIUM 1 G IJ SOLR
1.0000 g | Freq: Once | INTRAMUSCULAR | Status: AC
Start: 2023-04-07 — End: 2023-04-07
  Administered 2023-04-07: 1 g via INTRAMUSCULAR

## 2023-04-07 NOTE — Patient Instructions (Signed)
Urinary Tract Infection, Adult A urinary tract infection (UTI) is an infection of any part of the urinary tract. The urinary tract includes: The kidneys. The ureters. The bladder. The urethra. These organs make, store, and get rid of pee (urine) in the body. What are the causes? This infection is caused by germs (bacteria) in your genital area. These germs grow and cause swelling (inflammation) of your urinary tract. What increases the risk? The following factors may make you more likely to develop this condition: Using a small, thin tube (catheter) to drain pee. Not being able to control when you pee or poop (incontinence). Being female. If you are female, these things can increase the risk: Using these methods to prevent pregnancy: A medicine that kills sperm (spermicide). A device that blocks sperm (diaphragm). Having low levels of a female hormone (estrogen). Being pregnant. You are more likely to develop this condition if: You have genes that add to your risk. You are sexually active. You take antibiotic medicines. You have trouble peeing because of: A prostate that is bigger than normal, if you are female. A blockage in the part of your body that drains pee from the bladder. A kidney stone. A nerve condition that affects your bladder. Not getting enough to drink. Not peeing often enough. You have other conditions, such as: Diabetes. A weak disease-fighting system (immune system). Sickle cell disease. Gout. Injury of the spine. What are the signs or symptoms? Symptoms of this condition include: Needing to pee right away. Peeing small amounts often. Pain or burning when peeing. Blood in the pee. Pee that smells bad or not like normal. Trouble peeing. Pee that is cloudy. Fluid coming from the vagina, if you are female. Pain in the belly or lower back. Other symptoms include: Vomiting. Not feeling hungry. Feeling mixed up (confused). This may be the first symptom in  older adults. Being tired and grouchy (irritable). A fever. Watery poop (diarrhea). How is this treated? Taking antibiotic medicine. Taking other medicines. Drinking enough water. In some cases, you may need to see a specialist. Follow these instructions at home:  Medicines Take over-the-counter and prescription medicines only as told by your doctor. If you were prescribed an antibiotic medicine, take it as told by your doctor. Do not stop taking it even if you start to feel better. General instructions Make sure you: Pee until your bladder is empty. Do not hold pee for a long time. Empty your bladder after sex. Wipe from front to back after peeing or pooping if you are a female. Use each tissue one time when you wipe. Drink enough fluid to keep your pee pale yellow. Keep all follow-up visits. Contact a doctor if: You do not get better after 1-2 days. Your symptoms go away and then come back. Get help right away if: You have very bad back pain. You have very bad pain in your lower belly. You have a fever. You have chills. You feeling like you will vomit or you vomit. Summary A urinary tract infection (UTI) is an infection of any part of the urinary tract. This condition is caused by germs in your genital area. There are many risk factors for a UTI. Treatment includes antibiotic medicines. Drink enough fluid to keep your pee pale yellow. This information is not intended to replace advice given to you by your health care provider. Make sure you discuss any questions you have with your health care provider. Document Revised: 03/18/2020 Document Reviewed: 03/23/2020 Elsevier Patient Education    2024 Elsevier Inc.  

## 2023-04-07 NOTE — Progress Notes (Addendum)
I,Victoria T Deloria Lair, CMA,acting as a Neurosurgeon for [name], FNP.,have documented all relevant documentation on the behalf of [name], FNP,as directed by  [name], FNP while in the presence of [name], FNP.  Subjective:  Patient ID: Abigail [name] , female    DOB: 1935/09/24 , 87 y.o.   MRN: [id number]  Chief Complaint  Patient presents with   Urinary Tract Infection    HPI  Patient presents today for possible UTI. She reports waking up Sunday with blood in urine.  She denies feeling bloated, pain.  She admits frequent urination.  She reports taking her last pill of Nitrofurantoin end of last week. She admits no more blood in her urine since Sunday.   She reports she was dismissed from the urologist because he refused to do surgery on her kidney stone.   Urinary Tract Infection  This is a chronic problem. The current episode started 1 to 4 weeks ago. The problem occurs every urination. There has been no fever. Associated symptoms include hematuria. Pertinent negatives include no chills, flank pain, frequency, nausea or urgency.     Past Medical History:  Diagnosis Date   Anemia    Arthritis    knees, back   Bacteremia 04/08/2012   CAP (community acquired pneumonia) 04/12/2012   Carotid artery occlusion    Chronic kidney disease    stage 3 ckd no nephrologist   Complete uterine prolapse with prolapse of anterior vaginal wall    Complication of anesthesia    hard to wake up per pt   Constipation    Coronary artery disease    Diverticulitis yrs ago coialitis   Dyspnea    History of blood transfusion    History of radiation therapy    right lung 08/05/2021-09/18/2021  Dr Antony Blackbird   Hypertension    Hypothyroid    Lung cancer (HCC)    Numbness    in hands at times   Pneumonia    Pre-diabetes    Scoliosis    STEMI (ST elevation myocardial infarction) (HCC) 10/26/2019   DES RCA   Wears dentures    full dentures   Wears glasses    for reading      Family History  Problem Relation Age of Onset   Hypertension Mother    Colon cancer Neg Hx    Stomach cancer Neg Hx    Esophageal cancer Neg Hx      Current Outpatient Medications:    acetaminophen (TYLENOL) 325 MG tablet, Take 2 tablets (650 mg total) by mouth every 6 (six) hours as needed for mild pain (or Fever >/= 101)., Disp: , Rfl:    albuterol (VENTOLIN HFA) 108 (90 Base) MCG/ACT inhaler, TAKE 2 PUFFS BY MOUTH EVERY 6 HOURS AS NEEDED FOR WHEEZE OR SHORTNESS OF BREATH, Disp: 8.5 each, Rfl: 1   aspirin EC 81 MG tablet, Take 81 mg by mouth at bedtime., Disp: , Rfl:    atorvastatin (LIPITOR) 80 MG tablet, Take 1 tablet (80 mg total) by mouth daily at 6 PM., Disp: 90 tablet, Rfl: 3   Budeson-Glycopyrrol-Formoterol (BREZTRI AEROSPHERE) 160-9-4.8 MCG/ACT AERO, Inhale 2 puffs into the lungs in the morning and at bedtime., Disp: 10.7 g, Rfl: 6   cyclobenzaprine (FLEXERIL) 10 MG tablet, Take 1 tablet (10 mg total) by mouth 3 (three) times daily as needed for muscle spasms., Disp: 30 tablet, Rfl: 1   Dextromethorphan-guaiFENesin (DELSYM CGH/CHEST CONG DM CHILD) 5-100 MG/5ML LIQD, Take 30 mLs by mouth every  6 (six) hours as needed (cough)., Disp: , Rfl:    docusate sodium (COLACE) 100 MG capsule, Take 100-200 mg by mouth See admin instructions. Take 100 mg in the morning and 200 mg at bedtime, Disp: , Rfl:    estradiol (ESTRACE) 0.1 MG/GM vaginal cream, Place 0.5g twice a week at opening of vagina, Disp: 42.5 g, Rfl: 11   ezetimibe (ZETIA) 10 MG tablet, Take 1 tablet (10 mg total) by mouth daily., Disp: 90 tablet, Rfl: 3   ferrous sulfate 325 (65 FE) MG EC tablet, TAKE 1 TABLET BY MOUTH EVERY DAY WITH BREAKFAST, Disp: 30 tablet, Rfl: 2   hydrOXYzine (ATARAX) 10 MG tablet, Take 1 tablet (10 mg total) by mouth 3 (three) times daily as needed., Disp: 30 tablet, Rfl: 0   levothyroxine (SYNTHROID) 175 MCG tablet, TAKE 175 MCG (1 TABLET) BY MOUTH DAILY MONDAY-FRIDAY, Disp: 30 tablet, Rfl: 2    levothyroxine (SYNTHROID) 200 MCG tablet, TAKE 1 TABLET EVERY OTHER DAY. ALTERNATE BETWEEN THE 175 MCG AND THE 200 MCG, Disp: 30 tablet, Rfl: 1   lisinopril (ZESTRIL) 10 MG tablet, TAKE 1 TABLET BY MOUTH EVERY DAY, Disp: 90 tablet, Rfl: 1   nitroGLYCERIN (NITROSTAT) 0.4 MG SL tablet, Place 1 tablet (0.4 mg total) under the tongue every 5 (five) minutes x 3 doses as needed for chest pain., Disp: 25 tablet, Rfl: 3   OXYGEN, Inhale into the lungs., Disp: , Rfl:    prochlorperazine (COMPAZINE) 10 MG tablet, Take 1 tablet (10 mg total) by mouth every 6 (six) hours as needed for nausea or vomiting., Disp: 30 tablet, Rfl: 2   Throat Lozenges (LOZENGES MT), Use as directed 1 lozenge in the mouth or throat every 4 (four) hours as needed (cough)., Disp: , Rfl:    Trospium Chloride 60 MG CP24, TAKE 1 CAPSULE BY MOUTH EVERY DAY, Disp: 90 capsule, Rfl: 3   Allergies  Allergen Reactions   Codeine Nausea And Vomiting   Norvasc [Amlodipine] Rash and Other (See Comments)    rash     Review of Systems  Constitutional: Negative.  Negative for chills.  Respiratory: Negative.    Cardiovascular: Negative.   Gastrointestinal:  Negative for nausea.  Genitourinary:  Positive for hematuria. Negative for flank pain, frequency and urgency.  Neurological: Negative.   Psychiatric/Behavioral: Negative.       Today's Vitals   04/07/23 1213  BP: (!) 150/70  Pulse: 88  Temp: 98.1 F (36.7 C)  SpO2: 98%  Weight: 149 lb (67.6 kg)  Height: 5\' 6"  (1.676 m)   Body mass index is 24.05 kg/m.  Wt Readings from Last 3 Encounters:  04/07/23 149 lb (67.6 kg)  02/25/23 149 lb (67.6 kg)  02/25/23 149 lb 3.2 oz (67.7 kg)     Objective:  Physical Exam Vitals reviewed.  Constitutional:      General: She is not in acute distress.    Appearance: Normal appearance.  Cardiovascular:     Rate and Rhythm: Normal rate and regular rhythm.     Pulses: Normal pulses.     Heart sounds: Normal heart sounds. No murmur  heard. Pulmonary:     Effort: Pulmonary effort is normal. No respiratory distress.     Breath sounds: Normal breath sounds. No wheezing.     Comments: Wearing supplemental oxygen Abdominal:     Tenderness: There is no right CVA tenderness or left CVA tenderness.  Skin:    General: Skin is warm and dry.     Capillary  Refill: Capillary refill takes less than 2 seconds.  Neurological:     General: No focal deficit present.     Mental Status: She is alert and oriented to person, place, and time.     Cranial Nerves: No cranial nerve deficit.     Motor: No weakness.  Psychiatric:        Mood and Affect: Mood normal.        Behavior: Behavior normal.        Thought Content: Thought content normal.        Judgment: Judgment normal.         Assessment And Plan:  Acute cystitis with hematuria Assessment & Plan: Positive nitrates in urine, will treat with Rocephin 1 gram IM. Will also treat with bactrim. Urine culture is also sent to lab. Will try to contact Urology to find out her last visit   Orders: -     POCT urinalysis dipstick -     Urine Culture -     cefTRIAXone Sodium -     Sulfamethoxazole-Trimethoprim; Take 1 tablet by mouth 2 (two) times daily for 10 days.  Dispense: 20 tablet; Refill: 0     Return for 4 month bpc..  Patient was given opportunity to ask questions. Patient verbalized understanding of the plan and was able to repeat key elements of the plan. All questions were answered to their satisfaction.  [name], FNP  I, [name], FNP, have reviewed all documentation for this visit. The documentation on 04/20/23 for the exam, diagnosis, procedures, and orders are all accurate and complete.   IF YOU HAVE BEEN REFERRED TO A SPECIALIST, IT MAY TAKE 1-2 WEEKS TO SCHEDULE/PROCESS THE REFERRAL. IF YOU HAVE NOT HEARD FROM US/SPECIALIST IN TWO WEEKS, PLEASE GIVE [place] A CALL AT [phone].   THE PATIENT IS ENCOURAGED TO PRACTICE SOCIAL DISTANCING DUE TO THE  COVID-19 PANDEMIC.

## 2023-04-08 ENCOUNTER — Encounter: Payer: Self-pay | Admitting: Nurse Practitioner

## 2023-04-08 DIAGNOSIS — R3121 Asymptomatic microscopic hematuria: Secondary | ICD-10-CM | POA: Insufficient documentation

## 2023-04-08 NOTE — Assessment & Plan Note (Signed)
Positive nitrates in urine, will treat with Rocephin 1 gram IM. Will also treat with bactrim. Urine culture is also sent to lab. Will try to contact Urology to find out her last visit

## 2023-04-12 ENCOUNTER — Other Ambulatory Visit: Payer: Self-pay

## 2023-04-20 DIAGNOSIS — J449 Chronic obstructive pulmonary disease, unspecified: Secondary | ICD-10-CM | POA: Diagnosis not present

## 2023-04-20 DIAGNOSIS — C3490 Malignant neoplasm of unspecified part of unspecified bronchus or lung: Secondary | ICD-10-CM | POA: Diagnosis not present

## 2023-04-20 DIAGNOSIS — J9610 Chronic respiratory failure, unspecified whether with hypoxia or hypercapnia: Secondary | ICD-10-CM | POA: Diagnosis not present

## 2023-05-06 DIAGNOSIS — D225 Melanocytic nevi of trunk: Secondary | ICD-10-CM | POA: Diagnosis not present

## 2023-05-06 DIAGNOSIS — L814 Other melanin hyperpigmentation: Secondary | ICD-10-CM | POA: Diagnosis not present

## 2023-05-06 DIAGNOSIS — L821 Other seborrheic keratosis: Secondary | ICD-10-CM | POA: Diagnosis not present

## 2023-05-14 ENCOUNTER — Encounter: Payer: Self-pay | Admitting: Pharmacist

## 2023-05-14 NOTE — Progress Notes (Signed)
Pharmacy Quality Measure Review  This patient is appearing on a report for being at risk of failing the adherence measure for hypertension (ACEi/ARB) medications this calendar year.   Medication: lisinopril 10 mg daily Last fill date: 03/18/23 for 90 day supply  Insurance report was not up to date. No action needed at this time.   Jarrett Ables, PharmD PGY-1 Pharmacy Resident

## 2023-05-16 ENCOUNTER — Other Ambulatory Visit: Payer: Self-pay | Admitting: Physician Assistant

## 2023-05-16 DIAGNOSIS — E039 Hypothyroidism, unspecified: Secondary | ICD-10-CM

## 2023-05-21 DIAGNOSIS — J449 Chronic obstructive pulmonary disease, unspecified: Secondary | ICD-10-CM | POA: Diagnosis not present

## 2023-05-21 DIAGNOSIS — C3490 Malignant neoplasm of unspecified part of unspecified bronchus or lung: Secondary | ICD-10-CM | POA: Diagnosis not present

## 2023-05-21 DIAGNOSIS — J9610 Chronic respiratory failure, unspecified whether with hypoxia or hypercapnia: Secondary | ICD-10-CM | POA: Diagnosis not present

## 2023-05-22 ENCOUNTER — Other Ambulatory Visit: Payer: Self-pay

## 2023-05-22 ENCOUNTER — Emergency Department (HOSPITAL_COMMUNITY)
Admission: EM | Admit: 2023-05-22 | Discharge: 2023-05-22 | Disposition: A | Discharge status: Home / Self Care | Payer: PPO | Attending: Emergency Medicine | Admitting: Emergency Medicine

## 2023-05-22 ENCOUNTER — Emergency Department (HOSPITAL_COMMUNITY): Payer: PPO

## 2023-05-22 ENCOUNTER — Encounter (HOSPITAL_COMMUNITY): Payer: Self-pay

## 2023-05-22 DIAGNOSIS — S42295A Other nondisplaced fracture of upper end of left humerus, initial encounter for closed fracture: Secondary | ICD-10-CM | POA: Insufficient documentation

## 2023-05-22 DIAGNOSIS — Z7982 Long term (current) use of aspirin: Secondary | ICD-10-CM | POA: Insufficient documentation

## 2023-05-22 DIAGNOSIS — M25532 Pain in left wrist: Secondary | ICD-10-CM | POA: Diagnosis not present

## 2023-05-22 DIAGNOSIS — R609 Edema, unspecified: Secondary | ICD-10-CM | POA: Diagnosis not present

## 2023-05-22 DIAGNOSIS — N189 Chronic kidney disease, unspecified: Secondary | ICD-10-CM | POA: Insufficient documentation

## 2023-05-22 DIAGNOSIS — W19XXXA Unspecified fall, initial encounter: Secondary | ICD-10-CM | POA: Diagnosis not present

## 2023-05-22 DIAGNOSIS — S0990XA Unspecified injury of head, initial encounter: Secondary | ICD-10-CM | POA: Diagnosis not present

## 2023-05-22 DIAGNOSIS — W01198A Fall on same level from slipping, tripping and stumbling with subsequent striking against other object, initial encounter: Secondary | ICD-10-CM | POA: Diagnosis not present

## 2023-05-22 DIAGNOSIS — R9082 White matter disease, unspecified: Secondary | ICD-10-CM | POA: Diagnosis not present

## 2023-05-22 DIAGNOSIS — I700 Atherosclerosis of aorta: Secondary | ICD-10-CM | POA: Diagnosis not present

## 2023-05-22 DIAGNOSIS — Y92009 Unspecified place in unspecified non-institutional (private) residence as the place of occurrence of the external cause: Secondary | ICD-10-CM | POA: Diagnosis not present

## 2023-05-22 DIAGNOSIS — R918 Other nonspecific abnormal finding of lung field: Secondary | ICD-10-CM | POA: Diagnosis not present

## 2023-05-22 DIAGNOSIS — I10 Essential (primary) hypertension: Secondary | ICD-10-CM | POA: Diagnosis not present

## 2023-05-22 DIAGNOSIS — I2510 Atherosclerotic heart disease of native coronary artery without angina pectoris: Secondary | ICD-10-CM | POA: Diagnosis not present

## 2023-05-22 DIAGNOSIS — M25519 Pain in unspecified shoulder: Secondary | ICD-10-CM | POA: Diagnosis not present

## 2023-05-22 DIAGNOSIS — Z79899 Other long term (current) drug therapy: Secondary | ICD-10-CM | POA: Insufficient documentation

## 2023-05-22 DIAGNOSIS — I129 Hypertensive chronic kidney disease with stage 1 through stage 4 chronic kidney disease, or unspecified chronic kidney disease: Secondary | ICD-10-CM | POA: Insufficient documentation

## 2023-05-22 DIAGNOSIS — S4992XA Unspecified injury of left shoulder and upper arm, initial encounter: Secondary | ICD-10-CM | POA: Diagnosis present

## 2023-05-22 DIAGNOSIS — Z043 Encounter for examination and observation following other accident: Secondary | ICD-10-CM | POA: Diagnosis not present

## 2023-05-22 DIAGNOSIS — M1812 Unilateral primary osteoarthritis of first carpometacarpal joint, left hand: Secondary | ICD-10-CM | POA: Diagnosis not present

## 2023-05-22 DIAGNOSIS — R509 Fever, unspecified: Secondary | ICD-10-CM | POA: Diagnosis not present

## 2023-05-22 DIAGNOSIS — M129 Arthropathy, unspecified: Secondary | ICD-10-CM | POA: Diagnosis not present

## 2023-05-22 DIAGNOSIS — R457 State of emotional shock and stress, unspecified: Secondary | ICD-10-CM | POA: Diagnosis not present

## 2023-05-22 MED ORDER — HYDROCODONE-ACETAMINOPHEN 5-325 MG PO TABS
1.0000 | ORAL_TABLET | Freq: Two times a day (BID) | ORAL | 0 refills | Status: DC | PRN
Start: 2023-05-22 — End: 2023-06-02

## 2023-05-22 MED ORDER — ACETAMINOPHEN 500 MG PO TABS: 500.0000 mg | ORAL_TABLET | Freq: Four times a day (QID) | ORAL | 0 refills | Status: DC

## 2023-05-22 MED ORDER — HYDROCODONE-ACETAMINOPHEN 5-325 MG PO TABS
1.0000 | ORAL_TABLET | Freq: Two times a day (BID) | ORAL | 0 refills | Status: DC | PRN
Start: 2023-05-22 — End: 2023-05-22

## 2023-05-22 MED ORDER — OXYCODONE-ACETAMINOPHEN 5-325 MG PO TABS
1.0000 | ORAL_TABLET | Freq: Once | ORAL | Status: AC
  Administered 2023-05-22: 1 via ORAL
  Filled 2023-05-22: qty 1

## 2023-05-22 MED ORDER — HYDROCODONE-ACETAMINOPHEN 5-325 MG PO TABS: 1.0000 | ORAL_TABLET | Freq: Two times a day (BID) | ORAL | 0 refills | Status: DC | PRN

## 2023-05-22 NOTE — Progress Notes (Signed)
Orthopedic Tech Progress Note Patient Details:  Abigail [name] 03-Dec-1935 [id number]  Ortho Devices Type of Ortho Device: Volar splint Ortho Device/Splint Location: LUE Ortho Device/Splint Interventions: Application   Post Interventions Patient Tolerated: Well  [name] [name] 05/22/2023, 3:36 PM

## 2023-05-22 NOTE — ED Triage Notes (Signed)
Pt brought in PTAR from home. Larey Seat and landed on left side after trip[ping over oxygen tubing. Pt states she heard a crack. Pain in left shoulder with pain that radiates down the arm. Reports limited mobility in shoulder. Pt takes aspirin. Denies hitting head.

## 2023-05-22 NOTE — Discharge Instructions (Addendum)
Please call orthopedic surgery for follow-up in about 2 weeks.  The x-rays confirm that you have fracture around your shoulder.  Take the medications for pain control.  Take the narcotic medicine only if the pain is excruciating.  Please be very careful when you have to take narcotic medicine to prevent additional falls.

## 2023-05-22 NOTE — Progress Notes (Signed)
Orthopedic Tech Progress Note Patient Details:  Abigail [name] 1935-11-25 [id number]  Ortho Devices Type of Ortho Device: Arm sling Ortho Device/Splint Location: LUE Ortho Device/Splint Interventions: Application   Post Interventions Patient Tolerated: Well  [name] [name] 05/22/2023, 1:35 PM

## 2023-05-22 NOTE — ED Provider Notes (Addendum)
[place] EMERGENCY DEPARTMENT AT [facility] Provider Note   CSN: [id number] Arrival date & time: 05/22/23  1020     History  Chief Complaint  Patient presents with   Abigail [name] is a 87 y.o. female.  HPI    Pt is a 87 year old female who comes in with cc of fall. Pt had a fall while at home, she tripped over her oxygen cable about an hour prior to arrival. Pt is complaining of left-sided shoulder pain, wrist pain. Pt has no nausea, vomiting, visual complains, seizures, confusion, loss of consciousness, new weakness or numbness, gait instability. Pt is not on any anticoagulants.  Patient's past medical history is positive for chronic hypoxic respiratory failure, CKD, CAD, hypertension.  Home Medications Prior to Admission medications   Medication Sig Start Date End Date Taking? Authorizing Provider  acetaminophen (TYLENOL) 500 MG tablet Take 1 tablet (500 mg total) by mouth every 6 (six) hours. 05/22/23   Prosperi, Christian H, PA-C  albuterol (VENTOLIN HFA) 108 (90 Base) MCG/ACT inhaler TAKE 2 PUFFS BY MOUTH EVERY 6 HOURS AS NEEDED FOR WHEEZE OR SHORTNESS OF BREATH 12/20/21   Heilingoetter, Cassandra L, PA-C  aspirin EC 81 MG tablet Take 81 mg by mouth at bedtime.    [provider]  atorvastatin (LIPITOR) 80 MG tablet Take 1 tablet (80 mg total) by mouth daily at 6 PM. 09/18/20   Corky Crafts, MD  Budeson-Glycopyrrol-Formoterol (BREZTRI AEROSPHERE) 160-9-4.8 MCG/ACT AERO Inhale 2 puffs into the lungs in the morning and at bedtime. 10/22/22   Leslye Peer, MD  cyclobenzaprine (FLEXERIL) 10 MG tablet Take 1 tablet (10 mg total) by mouth 3 (three) times daily as needed for muscle spasms. 07/22/22   [name], FNP  Dextromethorphan-guaiFENesin (DELSYM CGH/CHEST CONG DM CHILD) 5-100 MG/5ML LIQD Take 30 mLs by mouth every 6 (six) hours as needed (cough).    [provider]  docusate sodium (COLACE) 100 MG capsule Take 100-200 mg by  mouth See admin instructions. Take 100 mg in the morning and 200 mg at bedtime    [provider]  estradiol (ESTRACE) 0.1 MG/GM vaginal cream Place 0.5g twice a week at opening of vagina 10/10/22   Marguerita Beards, MD  ezetimibe (ZETIA) 10 MG tablet Take 1 tablet (10 mg total) by mouth daily. 07/31/21   Chandrasekhar, [place] Dragon A, MD  ferrous sulfate 325 (65 FE) MG EC tablet TAKE 1 TABLET BY MOUTH EVERY DAY WITH BREAKFAST 03/23/23   Heilingoetter, Cassandra L, PA-C  HYDROcodone-acetaminophen (NORCO/VICODIN) 5-325 MG tablet Take 1 tablet by mouth every 12 (twelve) hours as needed. 05/22/23   Prosperi, Christian H, PA-C  hydrOXYzine (ATARAX) 10 MG tablet Take 1 tablet (10 mg total) by mouth 3 (three) times daily as needed. 11/26/21   [name], FNP  levothyroxine (SYNTHROID) 175 MCG tablet TAKE 175 MCG (1 TABLET) BY MOUTH DAILY MONDAY-FRIDAY 11/26/22   Heilingoetter, Cassandra L, PA-C  levothyroxine (SYNTHROID) 200 MCG tablet TAKE 1 TABLET EVERY OTHER DAY. ALTERNATE BETWEEN THE 175 MCG AND THE 200 MCG 05/16/23   Heilingoetter, Cassandra L, PA-C  lisinopril (ZESTRIL) 10 MG tablet TAKE 1 TABLET BY MOUTH EVERY DAY 03/31/23   [name], FNP  nitroGLYCERIN (NITROSTAT) 0.4 MG SL tablet Place 1 tablet (0.4 mg total) under the tongue every 5 (five) minutes x 3 doses as needed for chest pain. 07/22/22   [name], FNP  OXYGEN Inhale into the lungs.    [provider]  prochlorperazine (COMPAZINE) 10 MG tablet Take 1 tablet (10 mg total) by mouth every 6 (six) hours as needed for nausea or vomiting. 08/28/21   Heilingoetter, Cassandra L, PA-C  Throat Lozenges (LOZENGES MT) Use as directed 1 lozenge in the mouth or throat every 4 (four) hours as needed (cough).    [provider]  Trospium Chloride 60 MG CP24 TAKE 1 CAPSULE BY MOUTH EVERY DAY 09/30/22   Marguerita Beards, MD      Allergies    Codeine and Norvasc [amlodipine]    Review of Systems   Review of Systems  All other  systems reviewed and are negative.   Physical Exam Updated Vital Signs BP (!) 164/76 (BP Location: Right Arm)   Pulse 86   Temp 98 F (36.7 C) (Oral)   Resp 14   Ht 5\' 6"  (1.676 m)   Wt 68 kg   SpO2 100%   BMI 24.21 kg/m  Physical Exam Vitals and nursing note reviewed.  Constitutional:      Appearance: She is well-developed.  HENT:     Head: Normocephalic and atraumatic.  Eyes:     Extraocular Movements: Extraocular movements intact.     Pupils: Pupils are equal, round, and reactive to light.  Neck:     Comments: No midline c-spine tenderness Cardiovascular:     Rate and Rhythm: Normal rate and regular rhythm.  Pulmonary:     Effort: Pulmonary effort is normal. No respiratory distress.     Breath sounds: Normal breath sounds.  Chest:     Chest wall: No tenderness.  Abdominal:     General: Bowel sounds are normal. There is no distension.     Palpations: Abdomen is soft.     Tenderness: There is no abdominal tenderness.  Musculoskeletal:        General: Swelling and tenderness present.     Cervical back: Neck supple.     Comments: Patient has edema and tenderness over the left shoulder, no edema/swelling but tenderness over the left wrist with no tenderness over the anatomical snuffbox.  No long bone tenderness - upper and lower extrmeities and no pelvic pain, instability.  Skin:    General: Skin is warm and dry.     Findings: No rash.  Neurological:     Mental Status: She is alert and oriented to person, place, and time.     Cranial Nerves: No cranial nerve deficit.     ED Results / Procedures / Treatments   Labs (all labs ordered are listed, but only abnormal results are displayed) Labs Reviewed - No data to display  EKG None  Radiology No results found.  Procedures Procedures    Medications Ordered in ED Medications  oxyCODONE-acetaminophen (PERCOCET/ROXICET) 5-325 MG per tablet 1 tablet (1 tablet Oral Given 05/22/23 1240)    ED Course/ Medical  Decision Making/ A&P                                 Medical Decision Making Amount and/or Complexity of Data Reviewed Radiology: ordered.  Risk Prescription drug management.  87 year old patient comes in after sustaining what appears to be a mechanical fall. Pertinent past medical includes CAD, CKD, diabetes.  Patient is not on any blood thinners.  Based on my history and exam, differential diagnosis includes: - Traumatic brain injury including intracranial hemorrhage - Long bone fractures - Contusions - Soft tissue injury -  Concussion  Based on the initial assessment, the following workup was initiated x-ray of the left shoulder, burst, chest.  Patient is able to ambulate, therefore doubt there is any pelvic fracture.  CT scan of the brain ordered.  C-spine was cleared clinically.  I have independently interpreted the following imaging from the perspective of acute trauma: Left shoulder and the results indicate proximal humeral fracture.  Patient placed in a splint.  The patient appears reasonably screened and/or stabilized for discharge and I doubt any other medical condition or other [facility] requiring further screening, evaluation, or treatment in the ED at this time prior to discharge.   Results from the ER workup discussed with the patient face to face and all questions answered to the best of my ability. The patient is safe for discharge with strict return precautions.  3:00 PM Patient has questionable fracture of the wrist.  She does have tenderness, but not significant.  Still, it would be best to put in a volar splint.    Final Clinical Impression(s) / ED Diagnoses Final diagnoses:  Other closed nondisplaced fracture of proximal end of left humerus, initial encounter    Rx / DC Orders ED Discharge Orders          Ordered    acetaminophen (TYLENOL) 500 MG tablet  Every 6 hours,   Status:  Discontinued        05/22/23 1455    HYDROcodone-acetaminophen (NORCO/VICODIN)  5-325 MG tablet  Every 12 hours PRN,   Status:  Discontinued        05/22/23 1455    HYDROcodone-acetaminophen (NORCO/VICODIN) 5-325 MG tablet  Every 12 hours PRN,   Status:  Discontinued        05/22/23 1455    acetaminophen (TYLENOL) 500 MG tablet  Every 6 hours,   Status:  Discontinued        05/22/23 1455    acetaminophen (TYLENOL) 500 MG tablet  Every 6 hours,   Status:  Discontinued        05/22/23 1456    HYDROcodone-acetaminophen (NORCO/VICODIN) 5-325 MG tablet  Every 12 hours PRN,   Status:  Discontinued        05/22/23 1456    acetaminophen (TYLENOL) 500 MG tablet  Every 6 hours,   Status:  Discontinued        05/22/23 1456    HYDROcodone-acetaminophen (NORCO/VICODIN) 5-325 MG tablet  Every 12 hours PRN,   Status:  Discontinued        05/22/23 1456    HYDROcodone-acetaminophen (NORCO/VICODIN) 5-325 MG tablet  Every 12 hours PRN        05/22/23 1457    acetaminophen (TYLENOL) 500 MG tablet  Every 6 hours        05/22/23 1457              Derwood Kaplan, MD 05/22/23 1309    Derwood Kaplan, MD 05/22/23 1500    Derwood Kaplan, MD 05/22/23 1601

## 2023-05-29 ENCOUNTER — Ambulatory Visit: Payer: PPO | Admitting: Adult Health

## 2023-06-01 ENCOUNTER — Encounter (HOSPITAL_COMMUNITY): Payer: Self-pay

## 2023-06-01 ENCOUNTER — Other Ambulatory Visit: Payer: Self-pay | Admitting: Physician Assistant

## 2023-06-01 ENCOUNTER — Ambulatory Visit (HOSPITAL_COMMUNITY)
Admission: RE | Admit: 2023-06-01 | Discharge: 2023-06-01 | Disposition: A | Discharge status: Home / Self Care | Payer: PPO | Source: Ambulatory Visit | Attending: Physician Assistant | Admitting: Physician Assistant

## 2023-06-01 ENCOUNTER — Inpatient Hospital Stay: Payer: PPO | Attending: Internal Medicine

## 2023-06-01 DIAGNOSIS — C3490 Malignant neoplasm of unspecified part of unspecified bronchus or lung: Secondary | ICD-10-CM | POA: Insufficient documentation

## 2023-06-01 DIAGNOSIS — Z9981 Dependence on supplemental oxygen: Secondary | ICD-10-CM | POA: Insufficient documentation

## 2023-06-01 DIAGNOSIS — D649 Anemia, unspecified: Secondary | ICD-10-CM

## 2023-06-01 DIAGNOSIS — J941 Fibrothorax: Secondary | ICD-10-CM | POA: Diagnosis not present

## 2023-06-01 DIAGNOSIS — Z08 Encounter for follow-up examination after completed treatment for malignant neoplasm: Secondary | ICD-10-CM | POA: Insufficient documentation

## 2023-06-01 DIAGNOSIS — I700 Atherosclerosis of aorta: Secondary | ICD-10-CM | POA: Diagnosis not present

## 2023-06-01 DIAGNOSIS — J432 Centrilobular emphysema: Secondary | ICD-10-CM | POA: Insufficient documentation

## 2023-06-01 DIAGNOSIS — Z85118 Personal history of other malignant neoplasm of bronchus and lung: Secondary | ICD-10-CM | POA: Insufficient documentation

## 2023-06-01 LAB — CBC WITH DIFFERENTIAL (CANCER CENTER ONLY)
Abs Immature Granulocytes: 0.04 10*3/uL (ref 0.00–0.07)
Basophils Absolute: 0 10*3/uL (ref 0.0–0.1)
Basophils Relative: 1 %
Eosinophils Absolute: 0.2 10*3/uL (ref 0.0–0.5)
Eosinophils Relative: 3 %
HCT: 25.8 % — ABNORMAL LOW (ref 36.0–46.0)
Hemoglobin: 7.6 g/dL — ABNORMAL LOW (ref 12.0–15.0)
Immature Granulocytes: 1 %
Lymphocytes Relative: 5 %
Lymphs Abs: 0.3 10*3/uL — ABNORMAL LOW (ref 0.7–4.0)
MCH: 27.9 pg (ref 26.0–34.0)
MCHC: 29.5 g/dL — ABNORMAL LOW (ref 30.0–36.0)
MCV: 94.9 fL (ref 80.0–100.0)
Monocytes Absolute: 0.6 10*3/uL (ref 0.1–1.0)
Monocytes Relative: 7 %
Neutro Abs: 6.5 10*3/uL (ref 1.7–7.7)
Neutrophils Relative %: 83 %
Platelet Count: 306 10*3/uL (ref 150–400)
RBC: 2.72 MIL/uL — ABNORMAL LOW (ref 3.87–5.11)
RDW: 15.1 % (ref 11.5–15.5)
WBC Count: 7.6 10*3/uL (ref 4.0–10.5)
nRBC: 0 % (ref 0.0–0.2)

## 2023-06-01 LAB — CMP (CANCER CENTER ONLY)
ALT: 8 U/L (ref 0–44)
AST: 11 U/L — ABNORMAL LOW (ref 15–41)
Albumin: 3.9 g/dL (ref 3.5–5.0)
Alkaline Phosphatase: 120 U/L (ref 38–126)
Anion gap: 6 (ref 5–15)
BUN: 40 mg/dL — ABNORMAL HIGH (ref 8–23)
CO2: 25 mmol/L (ref 22–32)
Calcium: 9.3 mg/dL (ref 8.9–10.3)
Chloride: 105 mmol/L (ref 98–111)
Creatinine: 1.53 mg/dL — ABNORMAL HIGH (ref 0.44–1.00)
GFR, Estimated: 33 mL/min — ABNORMAL LOW (ref >60)
Glucose, Bld: 126 mg/dL — ABNORMAL HIGH (ref 70–99)
Potassium: 4.9 mmol/L (ref 3.5–5.1)
Sodium: 136 mmol/L (ref 135–145)
Total Bilirubin: 0.2 mg/dL — ABNORMAL LOW (ref 0.3–1.2)
Total Protein: 7.7 g/dL (ref 6.5–8.1)

## 2023-06-01 LAB — IRON AND IRON BINDING CAPACITY (CC-WL,HP ONLY)
Iron: 32 ug/dL (ref 28–170)
Saturation Ratios: 8 % — ABNORMAL LOW (ref 10.4–31.8)
TIBC: 395 ug/dL (ref 250–450)
UIBC: 363 ug/dL (ref 148–442)

## 2023-06-01 LAB — FERRITIN: Ferritin: 21 ng/mL (ref 11–307)

## 2023-06-02 ENCOUNTER — Encounter: Payer: Self-pay | Admitting: Adult Health

## 2023-06-02 ENCOUNTER — Ambulatory Visit: Payer: PPO | Admitting: Adult Health

## 2023-06-02 VITALS — BP 140/60 | HR 84 | Ht 66.0 in | Wt 154.0 lb

## 2023-06-02 DIAGNOSIS — C3490 Malignant neoplasm of unspecified part of unspecified bronchus or lung: Secondary | ICD-10-CM | POA: Diagnosis not present

## 2023-06-02 DIAGNOSIS — J449 Chronic obstructive pulmonary disease, unspecified: Secondary | ICD-10-CM | POA: Diagnosis not present

## 2023-06-02 DIAGNOSIS — D649 Anemia, unspecified: Secondary | ICD-10-CM | POA: Diagnosis not present

## 2023-06-02 DIAGNOSIS — J9611 Chronic respiratory failure with hypoxia: Secondary | ICD-10-CM

## 2023-06-02 NOTE — Patient Instructions (Addendum)
Restart Breztri 2 puffs Twice daily, rinse after use.  Albuterol inhaler As needed   Continue on Oxygen 3l/m.  Follow up with Oncology as planned.  Follow up with Orthopedics as planned  Follow up with Dr. Delton Coombes  in 6 months and As needed  -with Spirometry /DLCO

## 2023-06-02 NOTE — Addendum Note (Signed)
Addended by: Delrae Rend on: 06/02/2023 01:41 PM   Modules accepted: Orders

## 2023-06-02 NOTE — Progress Notes (Signed)
@Patient  ID: Abigail [name], female    DOB: 1936-08-04, 87 y.o.   MRN: [id number]  Chief Complaint  Patient presents with   Follow-up    Referring provider: [name], FNP  HPI: 87 year old female former smoker followed for COPD with emphysema, chronic respiratory failure on oxygen, stage IIIa left lower lobe lung adenocarcinoma (status post chemoradiation /immunotherapy-last tx was 09/25/22)    TEST/EVENTS :  CT chest 01/28/23 Stable appearance of masslike architectural distortion, fibrosis and volume loss within the perihilar left lung. No specific findings identified to suggest locally recurrent tumor or metastatic disease within the chest. . Unchanged small, chronically loculated left pleural effusion.    PET scan 06/2021 Hypermetabolic 3.2 cm superior segment left lower lobe pulmonary mass consistent with primary lung neoplasm. Associated left hilar and mediastinal metastatic adenopathy. 2. No findings for pulmonary metastatic disease, abdominal/pelvic metastatic disease or osseous metastatic disease.  CT chest 04/15/21 -Left lower lobe lung mass is identified with enlarged left hilar lymph node. Findings are concerning for primary bronchogenic Carcinoma   PFT pending   Oncology notes :   Weekly concurrent chemoradiation with carboplatin for an AUC of 2 and paclitaxel 45 mg per metered squared.  First dose on 08/05/2021.  Status post 6 cycles.  Last dose was given on September 03, 2021.    2) Consolidation treatment with immunotherapy with Imfinzi 1500 Mg IV every 4 weeks.  Last dose on 09/25/22.  Status post 13 cycles.   06/02/2023 Follow up : COPD w/ Emphysema, O2 RF , Lung Cancer  Patient returns for a 14-month follow-up.  Patient is followed for COPD with emphysema. She is oxygen dependent . Remains on Breztri Twice daily . On O2 3l/m .  Gets winded with minimal activity. Uses albuterol rarely. Does admit she does not always take on regular basis. Denies flare of cough or  wheezing . No recent steroid use for COPD flares. She does not drive and uses a rolling walker.   Patient has a history of stage IIIa non-small cell lung cancer, adenocarcinoma.  Initial presentation with left lower lobe lung mass, left hilar and mediastinal adenopathy.  She was diagnosed November 2022.  As above she underwent concurrent chemoradiation followed by immunotherapy which was completed September 25, 2022.  She is currently under observation with oncology.  CT chest January 28, 2023 showed stable masslike distortion/fibrosis and volume loss in the perihilar left lung, small loculated left pleural effusion.  No evidence of recurrent tumor or metastatic disease.  Patient had a follow-up CT yesterday with results pending.  Has Iron deficiency Anemia going for transfusion this week as hemoglobin is trending down.   The patient did have a fall last month with subsequent fracture to her left humerus and wrist.  She tripped over her oxygen tubing. She is following with orthopedics. She has a sling.   F Allergies  Allergen Reactions   Codeine Nausea And Vomiting   Norvasc [Amlodipine] Rash and Other (See Comments)    rash    Immunization History  Administered Date(s) Administered   Influenza Nasal 06/28/2019   Influenza, High Dose Seasonal PF 06/18/2018, 07/14/2021, 05/29/2022   Influenza-Unspecified 05/14/2015, 04/25/2017, 06/19/2018   PFIZER(Purple Top)SARS-COV-2 Vaccination 12/10/2019, 01/02/2020, 07/10/2020, 01/11/2021   Pfizer Covid-19 Vaccine Bivalent Booster 13yrs & up 05/29/2022   Pneumococcal Conjugate-13 09/01/2019   Pneumococcal Polysaccharide-23 11/26/2021   Pneumococcal-Unspecified 08/24/2012   Tdap 02/17/2008, 01/11/2020   Zoster Recombinant(Shingrix) 06/28/2019, 10/05/2019    Past Medical History:  Diagnosis Date  Anemia    Arthritis    knees, back   Bacteremia 04/08/2012   CAP (community acquired pneumonia) 04/12/2012   Carotid artery occlusion    Chronic kidney  disease    stage 3 ckd no nephrologist   Complete uterine prolapse with prolapse of anterior vaginal wall    Complication of anesthesia    hard to wake up per pt   Constipation    Coronary artery disease    Diverticulitis yrs ago coialitis   Dyspnea    History of blood transfusion    History of radiation therapy    right lung 08/05/2021-09/18/2021  Dr Antony Blackbird   Hypertension    Hypothyroid    Lung cancer (HCC)    Numbness    in hands at times   Pneumonia    Pre-diabetes    Scoliosis    STEMI (ST elevation myocardial infarction) (HCC) 10/26/2019   DES RCA   Wears dentures    full dentures   Wears glasses    for reading    Tobacco History: Social History   Tobacco Use  Smoking Status Former   Current packs/day: 0.00   Average packs/day: 1.5 packs/day for 35.0 years (52.5 ttl pk-yrs)   Types: Cigarettes   Start date: 04/06/1951   Quit date: 04/05/1986   Years since quitting: 37.1  Smokeless Tobacco Never   Counseling given: Not Answered   Outpatient Medications Prior to Visit  Medication Sig Dispense Refill   acetaminophen (TYLENOL) 500 MG tablet Take 1 tablet (500 mg total) by mouth every 6 (six) hours. 30 tablet 0   albuterol (VENTOLIN HFA) 108 (90 Base) MCG/ACT inhaler TAKE 2 PUFFS BY MOUTH EVERY 6 HOURS AS NEEDED FOR WHEEZE OR SHORTNESS OF BREATH 8.5 each 1   aspirin EC 81 MG tablet Take 81 mg by mouth at bedtime.     atorvastatin (LIPITOR) 80 MG tablet Take 1 tablet (80 mg total) by mouth daily at 6 PM. 90 tablet 3   Budeson-Glycopyrrol-Formoterol (BREZTRI AEROSPHERE) 160-9-4.8 MCG/ACT AERO Inhale 2 puffs into the lungs in the morning and at bedtime. 10.7 g 6   ezetimibe (ZETIA) 10 MG tablet Take 1 tablet (10 mg total) by mouth daily. 90 tablet 3   ferrous sulfate 325 (65 FE) MG EC tablet TAKE 1 TABLET BY MOUTH EVERY DAY WITH BREAKFAST 30 tablet 2   levothyroxine (SYNTHROID) 175 MCG tablet TAKE 175 MCG (1 TABLET) BY MOUTH DAILY MONDAY-FRIDAY 30 tablet 2    levothyroxine (SYNTHROID) 200 MCG tablet TAKE 1 TABLET EVERY OTHER DAY. ALTERNATE BETWEEN THE 175 MCG AND THE 200 MCG 30 tablet 1   lisinopril (ZESTRIL) 10 MG tablet TAKE 1 TABLET BY MOUTH EVERY DAY 90 tablet 1   Methylcellulose, Laxative, (CITRUCEL PO) Take 2 tablets by mouth 2 (two) times daily.     OXYGEN Inhale into the lungs.     Trospium Chloride 60 MG CP24 TAKE 1 CAPSULE BY MOUTH EVERY DAY 90 capsule 3   cyclobenzaprine (FLEXERIL) 10 MG tablet Take 1 tablet (10 mg total) by mouth 3 (three) times daily as needed for muscle spasms. (Patient not taking: Reported on 06/02/2023) 30 tablet 1   Dextromethorphan-guaiFENesin (DELSYM [facility]/CHEST CONG DM CHILD) 5-100 MG/5ML LIQD Take 30 mLs by mouth every 6 (six) hours as needed (cough). (Patient not taking: Reported on 06/02/2023)     estradiol (ESTRACE) 0.1 MG/GM vaginal cream Place 0.5g twice a week at opening of vagina (Patient not taking: Reported on 06/02/2023) 42.5 g 11  hydrOXYzine (ATARAX) 10 MG tablet Take 1 tablet (10 mg total) by mouth 3 (three) times daily as needed. (Patient not taking: Reported on 06/02/2023) 30 tablet 0   nitroGLYCERIN (NITROSTAT) 0.4 MG SL tablet Place 1 tablet (0.4 mg total) under the tongue every 5 (five) minutes x 3 doses as needed for chest pain. (Patient not taking: Reported on 06/02/2023) 25 tablet 3   prochlorperazine (COMPAZINE) 10 MG tablet Take 1 tablet (10 mg total) by mouth every 6 (six) hours as needed for nausea or vomiting. (Patient not taking: Reported on 06/02/2023) 30 tablet 2   docusate sodium (COLACE) 100 MG capsule Take 100-200 mg by mouth See admin instructions. Take 100 mg in the morning and 200 mg at bedtime (Patient not taking: Reported on 06/02/2023)     HYDROcodone-acetaminophen (NORCO/VICODIN) 5-325 MG tablet Take 1 tablet by mouth every 12 (twelve) hours as needed. (Patient not taking: Reported on 06/02/2023) 6 tablet 0   Throat Lozenges (LOZENGES MT) Use as directed 1 lozenge in the mouth or throat  every 4 (four) hours as needed (cough). (Patient not taking: Reported on 06/02/2023)     No facility-administered medications prior to visit.     Review of Systems:   Constitutional:   No  weight loss, night sweats,  Fevers, chills, + fatigue, or  lassitude.  HEENT:   No headaches,  Difficulty swallowing,  Tooth/dental problems, or  Sore throat,                No sneezing, itching, ear ache, nasal congestion, post nasal drip,   CV:  No chest pain,  Orthopnea, PND, swelling in lower extremities, anasarca, dizziness, palpitations, syncope.   GI  No heartburn, indigestion, abdominal pain, nausea, vomiting, diarrhea, change in bowel habits, loss of appetite, bloody stools.   Resp: .  No chest wall deformity  Skin: no rash or lesions.  GU: no dysuria, change in color of urine, no urgency or frequency.  No flank pain, no hematuria   MS:  Left arm sling/fx    Physical Exam  BP (!) 140/60 (BP Location: Right Arm, Patient Position: Sitting, Cuff Size: Normal)   Pulse 84   Ht 5\' 6"  (1.676 m)   Wt 154 lb (69.9 kg)   SpO2 98%   BMI 24.86 kg/m   GEN: A/Ox3; pleasant , NAD, elderly  on O2 , walker     HEENT:  [place]/AT,  EACs-clear, TMs-wnl, NOSE-clear, THROAT-clear, no lesions, no postnasal drip or exudate noted.   NECK:  Supple w/ fair ROM; no JVD; normal carotid impulses w/o bruits; no thyromegaly or nodules palpated; no lymphadenopathy.    RESP  Clear  P & A; w/o, wheezes/ rales/ or rhonchi. no accessory muscle use, no dullness to percussion  CARD:  RRR, no m/r/g, tr  peripheral edema, pulses intact, no cyanosis or clubbing.  GI:   Soft & nt; nml bowel sounds; no organomegaly or masses detected.   Musco: Warm bil, no deformities or joint swelling noted.   Neuro: alert, no focal deficits noted.    Skin: Warm, no lesions or rashes    Lab Results:  CBC   BMET    ProBNP No results found for: "PROBNP"  Imaging:  cefTRIAXone (ROCEPHIN) injection 1 g     Date  Action Dose Route User   Discharged on 05/22/2023   Admitted on 05/22/2023   04/07/2023 1245 Given 1 g Intramuscular (Left Ventrogluteal) [place] Ponder, CMA  No data to display          No results found for: "NITRICOXIDE"      Assessment & Plan:   COPD (chronic obstructive pulmonary disease) (HCC) COPD with Emphysema -check spirometry with DLCO on return  Encouraged on medication compliance  Restart Breztri Twice daily    Plan  Patient Instructions  Restart Breztri 2 puffs Twice daily, rinse after use.  Albuterol inhaler As needed   Continue on Oxygen 3l/m.  Follow up with Oncology as planned.  Follow up with Orthopedics as planned  Follow up with Dr. Delton Coombes  in 6 months and As needed  -with Spirometry /DLCO         Chronic hypoxic respiratory failure (HCC) Continue on Oxygen 3l/m to keep sats >88-90%   Malignant neoplasm of unspecified part of unspecified bronchus or lung (HCC) Stage IIIa LLL adenocarcinoma s/p concurrent chemoradiation and immunotherapy. - currently under observation with serial CT imaging. CT chest in June stable without evidence of recurrence. CT chest 06/01/23 pending results.  Keep follow up with Oncology.    Anemia Keep follow up with hem/onc , planned transfusion this week.      Rubye Oaks, NP 06/02/2023

## 2023-06-02 NOTE — Assessment & Plan Note (Signed)
Continue on Oxygen 3l/m to keep sats >88-90%

## 2023-06-02 NOTE — Assessment & Plan Note (Signed)
Keep follow up with hem/onc , planned transfusion this week.

## 2023-06-02 NOTE — Assessment & Plan Note (Signed)
Stage IIIa LLL adenocarcinoma s/p concurrent chemoradiation and immunotherapy. - currently under observation with serial CT imaging. CT chest in June stable without evidence of recurrence. CT chest 06/01/23 pending results.  Keep follow up with Oncology.

## 2023-06-02 NOTE — Assessment & Plan Note (Signed)
COPD with Emphysema -check spirometry with DLCO on return  Encouraged on medication compliance  Restart Breztri Twice daily    Plan  Patient Instructions  Restart Breztri 2 puffs Twice daily, rinse after use.  Albuterol inhaler As needed   Continue on Oxygen 3l/m.  Follow up with Oncology as planned.  Follow up with Orthopedics as planned  Follow up with Dr. Delton Coombes  in 6 months and As needed  -with Spirometry /DLCO

## 2023-06-03 ENCOUNTER — Other Ambulatory Visit: Payer: Self-pay | Admitting: Internal Medicine

## 2023-06-03 DIAGNOSIS — C3490 Malignant neoplasm of unspecified part of unspecified bronchus or lung: Secondary | ICD-10-CM

## 2023-06-03 DIAGNOSIS — D509 Iron deficiency anemia, unspecified: Secondary | ICD-10-CM

## 2023-06-04 ENCOUNTER — Other Ambulatory Visit: Payer: Self-pay | Admitting: Internal Medicine

## 2023-06-04 ENCOUNTER — Other Ambulatory Visit: Payer: Self-pay | Admitting: Physician Assistant

## 2023-06-04 ENCOUNTER — Inpatient Hospital Stay: Payer: PPO

## 2023-06-04 ENCOUNTER — Inpatient Hospital Stay: Payer: PPO | Admitting: Internal Medicine

## 2023-06-04 VITALS — BP 142/56 | HR 71 | Temp 97.9°F | Resp 15 | Ht 66.0 in | Wt 154.7 lb

## 2023-06-04 DIAGNOSIS — Z08 Encounter for follow-up examination after completed treatment for malignant neoplasm: Secondary | ICD-10-CM | POA: Diagnosis not present

## 2023-06-04 DIAGNOSIS — Z85118 Personal history of other malignant neoplasm of bronchus and lung: Secondary | ICD-10-CM | POA: Diagnosis not present

## 2023-06-04 DIAGNOSIS — D649 Anemia, unspecified: Secondary | ICD-10-CM

## 2023-06-04 DIAGNOSIS — D509 Iron deficiency anemia, unspecified: Secondary | ICD-10-CM

## 2023-06-04 DIAGNOSIS — C3490 Malignant neoplasm of unspecified part of unspecified bronchus or lung: Secondary | ICD-10-CM

## 2023-06-04 DIAGNOSIS — Z9981 Dependence on supplemental oxygen: Secondary | ICD-10-CM | POA: Diagnosis not present

## 2023-06-04 DIAGNOSIS — J432 Centrilobular emphysema: Secondary | ICD-10-CM | POA: Diagnosis not present

## 2023-06-04 LAB — CMP (CANCER CENTER ONLY)
ALT: 9 U/L (ref 0–44)
AST: 12 U/L — ABNORMAL LOW (ref 15–41)
Albumin: 3.8 g/dL (ref 3.5–5.0)
Alkaline Phosphatase: 142 U/L — ABNORMAL HIGH (ref 38–126)
Anion gap: 6 (ref 5–15)
BUN: 44 mg/dL — ABNORMAL HIGH (ref 8–23)
CO2: 26 mmol/L (ref 22–32)
Calcium: 9.3 mg/dL (ref 8.9–10.3)
Chloride: 105 mmol/L (ref 98–111)
Creatinine: 1.41 mg/dL — ABNORMAL HIGH (ref 0.44–1.00)
GFR, Estimated: 37 mL/min — ABNORMAL LOW (ref >60)
Glucose, Bld: 141 mg/dL — ABNORMAL HIGH (ref 70–99)
Potassium: 4.8 mmol/L (ref 3.5–5.1)
Sodium: 137 mmol/L (ref 135–145)
Total Bilirubin: 0.2 mg/dL — ABNORMAL LOW (ref 0.3–1.2)
Total Protein: 7.6 g/dL (ref 6.5–8.1)

## 2023-06-04 LAB — CBC WITH DIFFERENTIAL (CANCER CENTER ONLY)
Abs Immature Granulocytes: 0.03 10*3/uL (ref 0.00–0.07)
Basophils Absolute: 0 10*3/uL (ref 0.0–0.1)
Basophils Relative: 0 %
Eosinophils Absolute: 0.2 10*3/uL (ref 0.0–0.5)
Eosinophils Relative: 3 %
HCT: 24.3 % — ABNORMAL LOW (ref 36.0–46.0)
Hemoglobin: 7.3 g/dL — ABNORMAL LOW (ref 12.0–15.0)
Immature Granulocytes: 0 %
Lymphocytes Relative: 6 %
Lymphs Abs: 0.4 10*3/uL — ABNORMAL LOW (ref 0.7–4.0)
MCH: 28.4 pg (ref 26.0–34.0)
MCHC: 30 g/dL (ref 30.0–36.0)
MCV: 94.6 fL (ref 80.0–100.0)
Monocytes Absolute: 0.6 10*3/uL (ref 0.1–1.0)
Monocytes Relative: 9 %
Neutro Abs: 5.6 10*3/uL (ref 1.7–7.7)
Neutrophils Relative %: 82 %
Platelet Count: 306 10*3/uL (ref 150–400)
RBC: 2.57 MIL/uL — ABNORMAL LOW (ref 3.87–5.11)
RDW: 15.1 % (ref 11.5–15.5)
WBC Count: 6.8 10*3/uL (ref 4.0–10.5)
nRBC: 0 % (ref 0.0–0.2)

## 2023-06-04 LAB — FERRITIN: Ferritin: 17 ng/mL (ref 11–307)

## 2023-06-04 LAB — PREPARE RBC (CROSSMATCH)

## 2023-06-04 LAB — IRON AND IRON BINDING CAPACITY (CC-WL,HP ONLY)
Iron: 17 ug/dL — ABNORMAL LOW (ref 28–170)
Saturation Ratios: 4 % — ABNORMAL LOW (ref 10.4–31.8)
TIBC: 389 ug/dL (ref 250–450)
UIBC: 372 ug/dL (ref 148–442)
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
## 2023-06-05 ENCOUNTER — Inpatient Hospital Stay: Payer: PPO

## 2023-06-05 ENCOUNTER — Telehealth: Payer: Self-pay | Admitting: Pharmacy Technician

## 2023-06-05 ENCOUNTER — Telehealth: Payer: Self-pay | Admitting: Medical Oncology

## 2023-06-05 DIAGNOSIS — D649 Anemia, unspecified: Secondary | ICD-10-CM

## 2023-06-05 DIAGNOSIS — Z08 Encounter for follow-up examination after completed treatment for malignant neoplasm: Secondary | ICD-10-CM | POA: Diagnosis not present

## 2023-06-05 MED ORDER — SODIUM CHLORIDE 0.9% IV SOLUTION
250.0000 mL | Freq: Once | INTRAVENOUS | Status: AC
  Administered 2023-06-05: 100 mL via INTRAVENOUS

## 2023-06-05 MED ORDER — DIPHENHYDRAMINE HCL 25 MG PO CAPS
25.0000 mg | ORAL_CAPSULE | Freq: Once | ORAL | Status: AC
  Administered 2023-06-05: 25 mg via ORAL
  Filled 2023-06-05: qty 1

## 2023-06-05 MED ORDER — ACETAMINOPHEN 325 MG PO TABS
650.0000 mg | ORAL_TABLET | Freq: Once | ORAL | Status: AC
  Administered 2023-06-05: 650 mg via ORAL
  Filled 2023-06-05: qty 2

## 2023-06-05 NOTE — Telephone Encounter (Signed)
Mailbox is full - unable to leave message.

## 2023-06-05 NOTE — Patient Instructions (Addendum)
Your serum iron is low.  Please make an appointment at [facility] for an iron infusion. [phone]   Blood Transfusion, Adult, Care After The following information offers guidance on how to care for yourself after your procedure. Your health care provider may also give you more specific instructions. If you have problems or questions, contact your health care provider. What can I expect after the procedure? After the procedure, it is common to have: Bruising and soreness where the IV was inserted. A headache. Follow these instructions at home: IV insertion site care     Follow instructions from your health care provider about how to take care of your IV insertion site. Make sure you: Wash your hands with soap and water for at least 20 seconds before and after you change your bandage (dressing). If soap and water are not available, use hand sanitizer. Change your dressing as told by your health care provider. Check your IV insertion site every day for signs of infection. Check for: Redness, swelling, or pain. Bleeding from the site. Warmth. Pus or a bad smell. General instructions Take over-the-counter and prescription medicines only as told by your health care provider. Rest as told by your health care provider. Return to your normal activities as told by your health care provider. Keep all follow-up visits. Lab tests may need to be done at certain periods to recheck your blood counts. Contact a health care provider if: You have itching or red, swollen areas of skin (hives). You have a fever or chills. You have pain in the head, back, or chest. You feel anxious or you feel weak after doing your normal activities. You have redness, swelling, warmth, or pain around the IV insertion site. You have blood coming from the IV insertion site that does not stop with pressure. You have pus or a bad smell coming from your IV insertion site. If you received your blood  transfusion in an outpatient setting, you will be told whom to contact to report any reactions. Get help right away if: You have symptoms of a serious allergic or immune system reaction, including: Trouble breathing or shortness of breath. Swelling of the face, feeling flushed, or widespread rash. Dark urine or blood in the urine. Fast heartbeat. These symptoms may be an emergency. Get help right away. Call 911. Do not wait to see if the symptoms will go away. Do not drive yourself to the hospital. Summary Bruising and soreness around the IV insertion site are common. Check your IV insertion site every day for signs of infection. Rest as told by your health care provider. Return to your normal activities as told by your health care provider. Get help right away for symptoms of a serious allergic or immune system reaction to the blood transfusion. This information is not intended to replace advice given to you by your health care provider. Make sure you discuss any questions you have with your health care provider. Document Revised: 11/08/2021 Document Reviewed: 11/08/2021 Elsevier Patient Education  2024 [employer].

## 2023-06-05 NOTE — Telephone Encounter (Signed)
Auth Submission: NO AUTH NEEDED Site of care: Site of care: [facility] Payer: [employer] Medication & CPT/J Code(s) submitted: Venofer (Iron Sucrose) J1756 Route of submission (phone, fax, portal):  Phone # Fax # Auth type: Buy/Bill PB Units/visits requested: 3 Reference number:   Approval from: 06/05/23 to 08/25/23

## 2023-06-05 NOTE — Telephone Encounter (Signed)
Son notified of Abigail [name] 's message.

## 2023-06-05 NOTE — Telephone Encounter (Signed)
-----   Message from Lajuana Matte sent at 06/04/2023  8:58 PM EDT ----- Please let her know that her serum iron was very low and I will arrange for her to receive iron infusion at the [facility] market infusion center. ----- Message ----- From: Leory Plowman, Lab In [place] Sent: 06/04/2023   1:45 PM EDT To: Si Gaul, MD

## 2023-06-08 ENCOUNTER — Ambulatory Visit: Payer: Self-pay

## 2023-06-08 ENCOUNTER — Telehealth: Payer: Self-pay

## 2023-06-08 LAB — TYPE AND SCREEN
ABO/RH(D): B POS
Antibody Screen: NEGATIVE
Unit division: 0

## 2023-06-08 LAB — BPAM RBC
Blood Product Expiration Date: 202411072359
ISSUE DATE / TIME: 202410111454
Unit Type and Rh: 7300

## 2023-06-08 NOTE — Telephone Encounter (Signed)
This patient is appearing on a report for being at risk of failing the adherence measure for hypertension (ACEi/ARB) medications this calendar year.   Medication: Lisinopril 10 mg tablet Last fill date: 03/18/23 for 90 day supply  Insurance report was not up to date. No action needed at this time.

## 2023-06-08 NOTE — Patient Outreach (Signed)
Care Coordination   Follow Up Visit Note   06/08/2023 Name: Abigail [name] MRN: [id number] DOB: 1936-05-29  Abigail [name] is a 87 y.o. year old female who sees [name], FNP for primary care. I spoke with  [name] by phone today.  What matters to the patients health and wellness today?  Patient would like to have her urinary frequency and hematuria evaluated by her PCP.     Goals Addressed               This Visit's Progress     Patient Stated     COMPLETED: I need help with medical transportation (pt-stated)        Care Coordination Interventions: Determined patient continues to have issues with transportation, discussed patient does have a health plan benefit for transportation but requires a 3 day notice Discussed patient will continue to use her health plan benefit as needed       Other     To avoid complications from reoccurring urinary tract infections   On track     Care Coordination Interventions: Evaluation of current treatment plan related to Urinary frequency w/hematuria  and patient's adherence to plan as established by provider Determined patient started experiencing urinary frequency and hematuria x 1 day  Determined patient continues to have issues with transportation and would like to request a virtual visit with her PCP Sent in basket message to PCP [name] FNP advising of patient's symptoms and request for a virtual visit       To have urinary frequency and hematuria futher evaluated        Care Coordination Interventions: Evaluation of current treatment plan related to urinary frequency w/hematuria  and patient's adherence to plan as established by provider Determined patient began to experience urinary frequency w/hematuria x 1 day ago Educated on the importance of adequate intake of water, aiming for 48-64 oz daily unless otherwise directed Sent a secure message to PCP provider [name] FNP advising of patient's symptoms and requesting a  virtual visit with patient  Discussed plans with patient for ongoing care coordination follow up and provided patient with direct contact information for nurse care coordinator      To manage fatigue while staying physically active   On track     Care Coordination Interventions: Determined patient experienced a fall in her home when she accidentally tripped over her oxygen tubing Reviewed and discussed patient outcome included and ED visit Determined patient fractured her left wrist and left shoulder, she has a soft cast on the left wrist with a sling for her shoulder Discussed and reviewed with patient she has an upcoming scheduled appointment with Dr. August Saucer, orthopedics set for 06/10/23 @3 :15 PM Discussed patient will contact her health plan to schedule transportation for this appointment  Determined patient has a friend who provided her with some home cooked meals to eat during her time of having self care deficit following the fall, she also continues to receive Meals on Wheels     Interventions Today    Flowsheet Row Most Recent Value  Chronic Disease   Chronic disease during today's visit Other  [s/p left wrist/shoulder fracture,  s/p lung CA,  COPD,  s/p fall]  General Interventions   General Interventions Discussed/Reviewed General Interventions Discussed, General Interventions Reviewed, Doctor Visits, Communication with  Doctor Visits Discussed/Reviewed Doctor Visits Discussed, Doctor Visits Reviewed, Specialist  Communication with PCP/Specialists  [name] FNP]  Education Interventions   Education Provided  Provided Education  Provided Verbal Education On Nutrition, When to see the doctor  Nutrition Interventions   Nutrition Discussed/Reviewed Nutrition Discussed, Nutrition Reviewed  Safety Interventions   Safety Discussed/Reviewed Fall Risk, Home Safety, Safety Reviewed, Safety Discussed  Home Safety Assistive Devices          SDOH assessments and interventions  completed:  Yes  SDOH Interventions Today    Flowsheet Row Most Recent Value  SDOH Interventions   Transportation Interventions Other (Comment)  [patient will contact her health plan]        Care Coordination Interventions:  Yes, provided   Follow up plan: Follow up call scheduled for 06/22/23 @1 :30 PM    Encounter Outcome:  Patient Visit Completed

## 2023-06-08 NOTE — Patient Instructions (Signed)
Visit Information  Thank you for taking time to visit with me today. Please don't hesitate to contact me if I can be of assistance to you.   Following are the goals we discussed today:   Goals Addressed               This Visit's Progress     Patient Stated     COMPLETED: I need help with medical transportation (pt-stated)        Care Coordination Interventions: Determined patient continues to have issues with transportation, discussed patient does have a health plan benefit for transportation but requires a 3 day notice Discussed patient will continue to use her health plan benefit as needed       Other     To avoid complications from reoccurring urinary tract infections   On track     Care Coordination Interventions: Evaluation of current treatment plan related to Urinary frequency w/hematuria  and patient's adherence to plan as established by provider Determined patient started experiencing urinary frequency and hematuria x 1 day  Determined patient continues to have issues with transportation and would like to request a virtual visit with her PCP Sent in basket message to PCP [name] FNP advising of patient's symptoms and request for a virtual visit       To have urinary frequency and hematuria futher evaluated        Care Coordination Interventions: Evaluation of current treatment plan related to urinary frequency w/hematuria  and patient's adherence to plan as established by provider Determined patient began to experience urinary frequency w/hematuria x 1 day ago Educated on the importance of adequate intake of water, aiming for 48-64 oz daily unless otherwise directed Sent a secure message to PCP provider [name] FNP advising of patient's symptoms and requesting a virtual visit with patient  Discussed plans with patient for ongoing care coordination follow up and provided patient with direct contact information for nurse care coordinator         To manage  fatigue while staying physically active   On track     Care Coordination Interventions: Determined patient experienced a fall in her home when she accidentally tripped over her oxygen tubing Reviewed and discussed patient outcome included and ED visit Determined patient fractured her left wrist and left shoulder, she has a soft cast on the left wrist with a sling for her shoulder Discussed and reviewed with patient she has an upcoming scheduled appointment with Dr. August Saucer, orthopedics set for 06/10/23 @3 :15 PM Discussed patient will contact her health plan to schedule transportation for this appointment  Determined patient has a friend who provided her with some home cooked meals to eat during her time of having self care deficit following the fall, she also continues to receive Meals on Wheels         Our next appointment is by telephone on 06/22/23 at 1:30 PM  Please call the care guide team at [phone] if you need to cancel or reschedule your appointment.   If you are experiencing a Mental Health or Behavioral Health Crisis or need someone to talk to, please call 1-800-273-TALK (toll free, 24 hour hotline)  The patient verbalized understanding of instructions, educational materials, and care plan provided today and DECLINED offer to receive copy of patient instructions, educational materials, and care plan.   Delsa Sale RN BSN CCM [place]  [facility], [facility] Nurse Care Coordinator  Direct Dial: [phone] Website: [email or web address]

## 2023-06-09 ENCOUNTER — Telehealth: Payer: Self-pay

## 2023-06-09 ENCOUNTER — Other Ambulatory Visit (INDEPENDENT_AMBULATORY_CARE_PROVIDER_SITE_OTHER): Payer: PPO

## 2023-06-09 DIAGNOSIS — R8290 Unspecified abnormal findings in urine: Secondary | ICD-10-CM

## 2023-06-09 LAB — POCT URINALYSIS DIP (CLINITEK)
Glucose, UA: NEGATIVE mg/dL
Nitrite, UA: NEGATIVE
POC PROTEIN,UA: >=300 — AB
Spec Grav, UA: 1.025 (ref 1.010–1.025)
Urobilinogen, UA: 1 U/dL
pH, UA: 7.5 (ref 5.0–8.0)

## 2023-06-09 NOTE — Progress Notes (Signed)
Virtual Visit via telephone   This visit type was conducted due to national recommendations for restrictions regarding the COVID-19 Pandemic (e.g. social distancing) in an effort to limit this patient's exposure and mitigate transmission in our community.  Due to her co-morbid illnesses, this patient is at least at moderate risk for complications without adequate follow up.  This format is felt to be most appropriate for this patient at this time.  All issues noted in this document were discussed and addressed.  A limited physical exam was performed with this format.    This visit type was conducted due to national recommendations for restrictions regarding the COVID-19 Pandemic (e.g. social distancing) in an effort to limit this patient's exposure and mitigate transmission in our community.  Patients identity confirmed using two different identifiers.  This format is felt to be most appropriate for this patient at this time.  All issues noted in this document were discussed and addressed.  No physical exam was performed (except for noted visual exam findings with Video Visits).    Date:  06/21/2023   ID:  Abigail [name], DOB [birth year], MRN [id number]  Patient Location:  Home - spoke with Abigail [name]  Provider location:   Office    Chief Complaint:  urinary frequency  History of Present Illness:    Abigail [name] is a 87 y.o. female who presents via video conferencing for a telehealth visit today.    The patient does not have symptoms concerning for COVID-19 infection (fever, chills, cough, or new shortness of breath).   Patient presents today for blood in urine, stomach pressure, patient also has foul smelling urine. Patient reports it first started yesterday morning. Patient reports she   She had a blood transfusion on 10/10 with Dr. Shirline Frees.  She had been to [facility] and supposed to see him tomorrow or Friday - Dr. Jacquelyne Balint.  She is not due to have her labs redone by Dr  Shirline Frees. She had 2 units of blood last  She is due to see Dr. Dorene Grebe for her arm fracture.  She has infusions on 22nd and 29th and November 5th - iron transfusions. She did not go to ER due to having 2 EMS visits.       Past Medical History:  Diagnosis Date   Anemia    Arthritis    knees, back   Bacteremia 04/08/2012   CAP (community acquired pneumonia) 04/12/2012   Carotid artery occlusion    Chronic kidney disease    stage 3 ckd no nephrologist   Complete uterine prolapse with prolapse of anterior vaginal wall    Complication of anesthesia    hard to wake up per pt   Constipation    Coronary artery disease    Diverticulitis yrs ago coialitis   Dyspnea    History of blood transfusion    History of radiation therapy    right lung 08/05/2021-09/18/2021  Dr Antony Blackbird   Hypertension    Hypothyroid    Lung cancer (HCC)    Numbness    in hands at times   Pneumonia    Pre-diabetes    Scoliosis    STEMI (ST elevation myocardial infarction) (HCC) 10/26/2019   DES RCA   Wears dentures    full dentures   Wears glasses    for reading   Past Surgical History:  Procedure Laterality Date   ANTERIOR AND POSTERIOR REPAIR WITH SACROSPINOUS FIXATION N/A 12/20/2020   Procedure: SACROSPINOUS LIGAMENT FIXATION;  Surgeon: Marguerita Beards, MD;  Location: [facility];  Service: Gynecology;  Laterality: N/A;  Total time requested for all procedures is 2 hours   BACK SURGERY  1980 age 74   lower   bartholin cyst removal  age 20's   BLADDER SUSPENSION N/A 12/20/2020   Procedure: TRANSVAGINAL TAPE (TVT) PROCEDURE;  Surgeon: Marguerita Beards, MD;  Location: [facility];  Service: Gynecology;  Laterality: N/A;   BRONCHIAL BRUSHINGS  07/15/2021   Procedure: BRONCHIAL BRUSHINGS;  Surgeon: Leslye Peer, MD;  Location: [facility] ENDOSCOPY;  Service: Pulmonary;;   BRONCHIAL NEEDLE ASPIRATION BIOPSY  07/15/2021   Procedure: BRONCHIAL NEEDLE ASPIRATION  BIOPSIES;  Surgeon: Leslye Peer, MD;  Location: [facility] ENDOSCOPY;  Service: Pulmonary;;   CORONARY/GRAFT ACUTE MI REVASCULARIZATION N/A 10/26/2019   Procedure: Coronary/Graft Acute MI Revascularization;  Surgeon: Tonny Bollman, MD;  Location: [facility] INVASIVE CV LAB;  Service: Cardiovascular;  Laterality: N/A;   CYSTOCELE REPAIR N/A 12/20/2020   Procedure: ANTERIOR AND POSTERIOR REPAIR WITH PERINEORRHAPHY;  Surgeon: Marguerita Beards, MD;  Location: [facility];  Service: Gynecology;  Laterality: N/A;   CYSTOSCOPY N/A 12/20/2020   Procedure: CYSTOSCOPY;  Surgeon: Marguerita Beards, MD;  Location: [facility];  Service: Gynecology;  Laterality: N/A;   ELBOW SURGERY  1990's   left   ENDOBRONCHIAL ULTRASOUND N/A 07/15/2021   Procedure: ENDOBRONCHIAL ULTRASOUND;  Surgeon: Leslye Peer, MD;  Location: [facility] ENDOSCOPY;  Service: Pulmonary;  Laterality: N/A;   HEMOSTASIS CONTROL  07/15/2021   Procedure: HEMOSTASIS CONTROL;  Surgeon: Leslye Peer, MD;  Location: [facility] ENDOSCOPY;  Service: Pulmonary;;   LEFT HEART CATH AND CORONARY ANGIOGRAPHY N/A 10/26/2019   Procedure: LEFT HEART CATH AND CORONARY ANGIOGRAPHY;  Surgeon: Tonny Bollman, MD;  Location: [facility] INVASIVE CV LAB;  Service: Cardiovascular;  Laterality: N/A;   TONSILLECTOMY       Current Meds  Medication Sig   [EXPIRED] nitrofurantoin, macrocrystal-monohydrate, (MACROBID) 100 MG capsule Take 1 capsule (100 mg total) by mouth 2 (two) times daily for 7 days.     Allergies:   Codeine and Norvasc [amlodipine]   Social History   Tobacco Use   Smoking status: Former    Current packs/day: 0.00    Average packs/day: 1.5 packs/day for 35.0 years (52.5 ttl pk-yrs)    Types: Cigarettes    Start date: 04/06/1951    Quit date: 04/05/1986    Years since quitting: 37.2   Smokeless tobacco: Never  Vaping Use   Vaping status: Never Used  Substance Use Topics   Alcohol use: No   Drug use: No     Family  Hx: The patient's family history includes Hypertension in her mother. There is no history of Colon cancer, Stomach cancer, or Esophageal cancer.  ROS:   Please see the history of present illness.    Review of Systems  Constitutional: Negative.   HENT: Negative.    Respiratory: Negative.    Cardiovascular: Negative.   Gastrointestinal: Negative.   Genitourinary: Negative.   Musculoskeletal: Negative.   Neurological: Negative.   Psychiatric/Behavioral: Negative.      All other systems reviewed and are negative.   Labs/Other Tests and Data Reviewed:    Recent Labs: 09/07/2022: Magnesium 2.5 02/25/2023: TSH 0.857 06/04/2023: ALT 9; BUN 44; Creatinine 1.41; Hemoglobin 7.3; Platelet Count 306; Potassium 4.8; Sodium 137   Recent Lipid Panel Lab Results  Component Value Date/Time   CHOL 223 (H) 07/22/2022 09:42 AM  TRIG 179 (H) 07/22/2022 09:42 AM   HDL 37 (L) 07/22/2022 09:42 AM   CHOLHDL 6.0 (H) 07/22/2022 09:42 AM   CHOLHDL 4.8 10/26/2019 10:46 PM   LDLCALC 153 (H) 07/22/2022 09:42 AM    Wt Readings from Last 3 Encounters:  06/16/23 151 lb (68.5 kg)  06/04/23 154 lb 11.2 oz (70.2 kg)  06/02/23 154 lb (69.9 kg)     Exam:    Vital Signs:  There were no vitals taken for this visit.    Physical Exam Vitals reviewed.  Constitutional:      General: She is not in acute distress.    Appearance: Normal appearance.  Neurological:     Mental Status: She is alert.     ASSESSMENT & PLAN:    Foul smelling urine Assessment & Plan: Urinalysis was negative for nitrates and awaiting urine culture. After reviewing Urology notes she was to f/u to see what other options they had to treat her frequent symptoms and kidney stones. She does not recall that conversation. She plans to call and schedule an appt.    Hematuria, unspecified type Assessment & Plan: Likely related to her kidney stones.   Orders: -     Nitrofurantoin Monohyd Macro; Take 1 capsule (100 mg total) by mouth  2 (two) times daily for 7 days.  Dispense: 10 capsule; Refill: 0 -     Ambulatory referral to Home Health  Urinary urgency Assessment & Plan: She has persistent urinary symptoms she is encouraged to take AZO to help with this.    Closed fracture of left upper extremity with routine healing, subsequent encounter Assessment & Plan: Occurred after a fall, she has an appt with Ortho within the next couple weeks. She is having difficulty with her ADLs. Will place a referral for Home Health Aid and PT/OT  Orders: -     Ambulatory referral to Home Health     COVID-19 Education: The signs and symptoms of COVID-19 were discussed with the patient and how to seek care for testing (follow up with PCP or arrange E-visit).  The importance of social distancing was discussed today.  Patient Risk:   After full review of this patients clinical status, I feel that they are at least moderate risk at this time.  Time:   Today, I have spent 24 minutes/ seconds with the patient with telehealth technology discussing above diagnoses.     Medication Adjustments/Labs and Tests Ordered: Current medicines are reviewed at length with the patient today.  Concerns regarding medicines are outlined above.   Tests Ordered: Orders Placed This Encounter  Procedures   Ambulatory referral to Home Health    Medication Changes: Meds ordered this encounter  Medications   nitrofurantoin, macrocrystal-monohydrate, (MACROBID) 100 MG capsule    Sig: Take 1 capsule (100 mg total) by mouth 2 (two) times daily for 7 days.    Dispense:  10 capsule    Refill:  0    Disposition:  Follow up PRN  Signed, [name], FNP

## 2023-06-09 NOTE — Telephone Encounter (Signed)
-----   Message from [facility] Ellin Mayhew sent at 06/09/2023  2:30 PM EDT ----- Thank Bonita Quin ----- Message ----- From: Charma Igo, RN Sent: 06/05/2023   9:49 AM EDT To: Verlee Rossetti, CMA   ----- Message ----- From: Si Gaul, MD Sent: 06/04/2023   8:58 PM EDT To: Charma Igo, RN; [facility]  Please let her know that her serum iron was very low and I will arrange for her to receive iron infusion at the [facility] market infusion center. ----- Message ----- From: Leory Plowman, Lab In [place] Sent: 06/04/2023   1:45 PM EDT To: Si Gaul, MD

## 2023-06-09 NOTE — Telephone Encounter (Signed)
Spoke with pt about the below. Pt is aware of the Iron appts at [facility] center.

## 2023-06-10 ENCOUNTER — Ambulatory Visit (INDEPENDENT_AMBULATORY_CARE_PROVIDER_SITE_OTHER): Payer: PPO

## 2023-06-10 ENCOUNTER — Telehealth (INDEPENDENT_AMBULATORY_CARE_PROVIDER_SITE_OTHER): Payer: PPO | Admitting: Nurse Practitioner

## 2023-06-10 ENCOUNTER — Encounter: Payer: Self-pay | Admitting: Orthopedic Surgery

## 2023-06-10 ENCOUNTER — Ambulatory Visit: Payer: PPO | Admitting: Orthopedic Surgery

## 2023-06-10 DIAGNOSIS — M25512 Pain in left shoulder: Secondary | ICD-10-CM

## 2023-06-10 DIAGNOSIS — M25532 Pain in left wrist: Secondary | ICD-10-CM

## 2023-06-10 DIAGNOSIS — R8290 Unspecified abnormal findings in urine: Secondary | ICD-10-CM

## 2023-06-10 DIAGNOSIS — S42302D Unspecified fracture of shaft of humerus, left arm, subsequent encounter for fracture with routine healing: Secondary | ICD-10-CM | POA: Diagnosis not present

## 2023-06-10 DIAGNOSIS — R3915 Urgency of urination: Secondary | ICD-10-CM | POA: Diagnosis not present

## 2023-06-10 DIAGNOSIS — R319 Hematuria, unspecified: Secondary | ICD-10-CM | POA: Diagnosis not present

## 2023-06-10 MED ORDER — NITROFURANTOIN MONOHYD MACRO 100 MG PO CAPS
100.0000 mg | ORAL_CAPSULE | Freq: Two times a day (BID) | ORAL | 0 refills | Status: AC
Start: 2023-06-10 — End: 2023-06-17

## 2023-06-11 ENCOUNTER — Encounter: Payer: Self-pay | Admitting: Orthopedic Surgery

## 2023-06-11 LAB — URINE CULTURE

## 2023-06-11 NOTE — Progress Notes (Signed)
Office Visit Note   Patient: Abigail [name]           Date of Birth: [birth year]           MRN: [id number] Visit Date: 06/10/2023 Requested by: [name], FNP [address] [place],  [place] [address] PCP: [name], FNP  Subjective: Chief Complaint  Patient presents with   Left Shoulder - Pain    DOI 05/22/2023   Left Wrist - Pain    DOI 05/22/2023    HPI: Abigail [name] is a 87 y.o. female who presents to the office reporting left shoulder and left wrist pain following a fall on 05/22/2023.  Those radiographs were reviewed.  Proximal humerus fracture noted with mild displacement along with radial styloid fracture with minimal displacement.  She has been in a sling and a wrist splint.  She is right-hand dominant.  Not taking any pain medication.  States she has achiness in that left arm.  Denies any groin pain.  She tripped over her O2 cord has a mechanism of injury..                ROS: All systems reviewed are negative as they relate to the chief complaint within the history of present illness.  Patient denies fevers or chills.  Assessment & Plan: Visit Diagnoses:  1. Acute pain of left shoulder   2. Pain in left wrist     Plan: Impression is left shoulder proximal humerus fracture with motion today consistent with healing of the fracture.  Radiographically not too much callus but there is on physical exam no motion at the fracture site.  Wrist also looks pretty reasonable in terms of minimal tenderness.  Removable wrist splint recommended for the next 3 weeks.  Home health physical therapy for left wrist range of motion and left shoulder passive range of motion below 90 degrees of forward flexion and 90 degrees of abduction.  Come back in 3 weeks for clinical recheck repeat radiographs and advancement of physical therapy.  Follow-Up Instructions: No follow-ups on file.   Orders:  Orders Placed This Encounter  Procedures   XR Wrist 2 Views Left   XR Shoulder Left    Ambulatory referral to Physical Therapy   No orders of the defined types were placed in this encounter.     Procedures: No procedures performed   Clinical Data: No additional findings.  Objective: Vital Signs: There were no vitals taken for this visit.  Physical Exam:  Constitutional: Patient appears well-developed HEENT:  Head: Normocephalic Eyes:EOM are normal Neck: Normal range of motion Cardiovascular: Normal rate Pulmonary/chest: Effort normal Neurologic: Patient is alert Skin: Skin is warm Psychiatric: Patient has normal mood and affect  Ortho Exam: Ortho exam demonstrates pretty reasonable grip strength with minimal swelling in that left wrist.  Radial pulse intact.  Mild tenderness over the radial styloid but full range of motion of the left elbow.  Patient has slightly stiff left shoulder but with rotation the fracture moves as a unit.  Ecchymosis resolving.  No groin pain with internal/external rotation of that left leg.  Specialty Comments:  No specialty comments available.  Imaging: XR Shoulder Left  Result Date: 06/11/2023 Multiple radiographic views left shoulder reviewed.  Proximal humerus fracture is noted with reasonably good alignment and no dislocation of the head well in the socket.  Acromiohumeral distance maintained.  No obvious callus formation present.  There is ongoing pain otherwise clear AC joint intact  XR  Wrist 2 Views Left  Result Date: 06/11/2023 AP lateral radiographs left wrist reviewed.  Radial styloid fracture which is minimally to nondisplaced is present.  No change in alignment from radiographs 3 weeks ago.  Degenerative changes present throughout the radiocarpal and intercarpal spaces    PMFS History: Patient Active Problem List   Diagnosis Date Noted   Asymptomatic microscopic hematuria 04/08/2023   Right wrist pain 02/25/2023   Supplemental oxygen dependent 02/25/2023   Chronic pain of both knees 02/25/2023    Atherosclerosis of aorta (HCC) 02/25/2023   Hiatal hernia 11/20/2022   Generalized weakness 11/20/2022   Chronic cough 10/22/2022   Nodule of kidney 09/15/2022   Left ureteral stone 09/07/2022   Gross hematuria 09/07/2022   Chronic hypoxic respiratory failure (HCC) 09/07/2022   Nephrolithiasis 09/07/2022   Staghorn calculus 09/07/2022   Chronic iron deficiency anemia 04/21/2022   Anemia 02/13/2022   Encounter for antineoplastic immunotherapy 11/20/2021   COPD (chronic obstructive pulmonary disease) (HCC) 08/20/2021   Dysphagia 08/20/2021   History of lung cancer 07/31/2021   Malignant neoplasm of unspecified part of unspecified bronchus or lung (HCC) 07/25/2021   Encounter for antineoplastic chemotherapy 07/25/2021   Mediastinal adenopathy 07/15/2021   Coronary artery disease involving native coronary artery of native heart with other form of angina pectoris (HCC) 04/11/2021   Right bundle branch block 04/11/2021   Newly recognized heart murmur 04/11/2021   Occult blood positive stool 11/09/2020   Chest pain with moderate risk for cardiac etiology 12/01/2019   Urinary urgency 12/01/2019   Anxiety 02/09/2019   Prediabetes 12/29/2018   Essential hypertension 08/27/2018   Vitamin D deficiency 06/25/2018   Hyperlipidemia 06/25/2018   Acquired hypothyroidism 06/25/2018   Benign hypertension with CKD (chronic kidney disease) stage III (HCC) 06/25/2018   Stage 3b chronic kidney disease (CKD) (HCC) 06/25/2018   OAB (overactive bladder) 08/26/2016   UTI (urinary tract infection) 04/05/2012   LLQ abdominal pain 04/05/2012   Hyponatremia 04/05/2012   Past Medical History:  Diagnosis Date   Anemia    Arthritis    knees, back   Bacteremia 04/08/2012   CAP (community acquired pneumonia) 04/12/2012   Carotid artery occlusion    Chronic kidney disease    stage 3 ckd no nephrologist   Complete uterine prolapse with prolapse of anterior vaginal wall    Complication of anesthesia     hard to wake up per pt   Constipation    Coronary artery disease    Diverticulitis yrs ago coialitis   Dyspnea    History of blood transfusion    History of radiation therapy    right lung 08/05/2021-09/18/2021  Dr Antony Blackbird   Hypertension    Hypothyroid    Lung cancer (HCC)    Numbness    in hands at times   Pneumonia    Pre-diabetes    Scoliosis    STEMI (ST elevation myocardial infarction) (HCC) 10/26/2019   DES RCA   Wears dentures    full dentures   Wears glasses    for reading    Family History  Problem Relation Age of Onset   Hypertension Mother    Colon cancer Neg Hx    Stomach cancer Neg Hx    Esophageal cancer Neg Hx     Past Surgical History:  Procedure Laterality Date   ANTERIOR AND POSTERIOR REPAIR WITH SACROSPINOUS FIXATION N/A 12/20/2020   Procedure: SACROSPINOUS LIGAMENT FIXATION;  Surgeon: Marguerita Beards, MD;  Location: [facility];  Service: Gynecology;  Laterality: N/A;  Total time requested for all procedures is 2 hours   BACK SURGERY  1980 age 38   lower   bartholin cyst removal  age 48's   BLADDER SUSPENSION N/A 12/20/2020   Procedure: TRANSVAGINAL TAPE (TVT) PROCEDURE;  Surgeon: Marguerita Beards, MD;  Location: [facility];  Service: Gynecology;  Laterality: N/A;   BRONCHIAL BRUSHINGS  07/15/2021   Procedure: BRONCHIAL BRUSHINGS;  Surgeon: Leslye Peer, MD;  Location: [facility] ENDOSCOPY;  Service: Pulmonary;;   BRONCHIAL NEEDLE ASPIRATION BIOPSY  07/15/2021   Procedure: BRONCHIAL NEEDLE ASPIRATION BIOPSIES;  Surgeon: Leslye Peer, MD;  Location: [facility] ENDOSCOPY;  Service: Pulmonary;;   CORONARY/GRAFT ACUTE MI REVASCULARIZATION N/A 10/26/2019   Procedure: Coronary/Graft Acute MI Revascularization;  Surgeon: Tonny Bollman, MD;  Location: [facility] INVASIVE CV LAB;  Service: Cardiovascular;  Laterality: N/A;   CYSTOCELE REPAIR N/A 12/20/2020   Procedure: ANTERIOR AND POSTERIOR REPAIR WITH PERINEORRHAPHY;  Surgeon:  Marguerita Beards, MD;  Location: [facility];  Service: Gynecology;  Laterality: N/A;   CYSTOSCOPY N/A 12/20/2020   Procedure: CYSTOSCOPY;  Surgeon: Marguerita Beards, MD;  Location: [facility];  Service: Gynecology;  Laterality: N/A;   ELBOW SURGERY  1990's   left   ENDOBRONCHIAL ULTRASOUND N/A 07/15/2021   Procedure: ENDOBRONCHIAL ULTRASOUND;  Surgeon: Leslye Peer, MD;  Location: [facility] ENDOSCOPY;  Service: Pulmonary;  Laterality: N/A;   HEMOSTASIS CONTROL  07/15/2021   Procedure: HEMOSTASIS CONTROL;  Surgeon: Leslye Peer, MD;  Location: [facility] ENDOSCOPY;  Service: Pulmonary;;   LEFT HEART CATH AND CORONARY ANGIOGRAPHY N/A 10/26/2019   Procedure: LEFT HEART CATH AND CORONARY ANGIOGRAPHY;  Surgeon: Tonny Bollman, MD;  Location: [facility] INVASIVE CV LAB;  Service: Cardiovascular;  Laterality: N/A;   TONSILLECTOMY     Social History   Occupational History   Occupation: semi retired  Tobacco Use   Smoking status: Former    Current packs/day: 0.00    Average packs/day: 1.5 packs/day for 35.0 years (52.5 ttl pk-yrs)    Types: Cigarettes    Start date: 04/06/1951    Quit date: 04/05/1986    Years since quitting: 37.2   Smokeless tobacco: Never  Vaping Use   Vaping status: Never Used  Substance and Sexual Activity   Alcohol use: No   Drug use: No   Sexual activity: Not Currently    Birth control/protection: Post-menopausal

## 2023-06-12 DIAGNOSIS — R350 Frequency of micturition: Secondary | ICD-10-CM | POA: Diagnosis not present

## 2023-06-12 DIAGNOSIS — R310 Gross hematuria: Secondary | ICD-10-CM | POA: Diagnosis not present

## 2023-06-12 DIAGNOSIS — N200 Calculus of kidney: Secondary | ICD-10-CM | POA: Diagnosis not present

## 2023-06-13 DIAGNOSIS — Z9981 Dependence on supplemental oxygen: Secondary | ICD-10-CM | POA: Diagnosis not present

## 2023-06-13 DIAGNOSIS — I119 Hypertensive heart disease without heart failure: Secondary | ICD-10-CM | POA: Diagnosis not present

## 2023-06-13 DIAGNOSIS — L89322 Pressure ulcer of left buttock, stage 2: Secondary | ICD-10-CM | POA: Diagnosis not present

## 2023-06-13 DIAGNOSIS — I2510 Atherosclerotic heart disease of native coronary artery without angina pectoris: Secondary | ICD-10-CM | POA: Diagnosis not present

## 2023-06-13 DIAGNOSIS — E1122 Type 2 diabetes mellitus with diabetic chronic kidney disease: Secondary | ICD-10-CM | POA: Diagnosis not present

## 2023-06-13 DIAGNOSIS — S42295D Other nondisplaced fracture of upper end of left humerus, subsequent encounter for fracture with routine healing: Secondary | ICD-10-CM | POA: Diagnosis not present

## 2023-06-13 DIAGNOSIS — M25532 Pain in left wrist: Secondary | ICD-10-CM | POA: Diagnosis not present

## 2023-06-13 DIAGNOSIS — M25512 Pain in left shoulder: Secondary | ICD-10-CM | POA: Diagnosis not present

## 2023-06-13 DIAGNOSIS — J9611 Chronic respiratory failure with hypoxia: Secondary | ICD-10-CM | POA: Diagnosis not present

## 2023-06-13 DIAGNOSIS — W19XXXD Unspecified fall, subsequent encounter: Secondary | ICD-10-CM | POA: Diagnosis not present

## 2023-06-13 DIAGNOSIS — N189 Chronic kidney disease, unspecified: Secondary | ICD-10-CM | POA: Diagnosis not present

## 2023-06-15 ENCOUNTER — Telehealth: Payer: Self-pay | Admitting: Orthopedic Surgery

## 2023-06-15 ENCOUNTER — Other Ambulatory Visit: Payer: Self-pay | Admitting: Internal Medicine

## 2023-06-15 ENCOUNTER — Telehealth: Payer: Self-pay | Admitting: *Deleted

## 2023-06-15 NOTE — Telephone Encounter (Signed)
Dontavia states she was seen by PCP and Urologist. They believe she might have a kidney stone. Dr Arbutus Ped does not feel she needs to have labs tomorrow. Will proceed with iron infusions as scheduled. Pt has follow up with Urologist. To notify us if any problems.

## 2023-06-15 NOTE — Telephone Encounter (Signed)
Disregard

## 2023-06-15 NOTE — Telephone Encounter (Signed)
Abigail [name] states Friday, 10/11 she noticed blood in her urine. Has a UTI and is taking antibiotics. Was still having blood in her urine last week. Is asking for Dr Arbutus Ped to check her labs while she is at [facility] for her iron infusion tomorrow. [employer] states they can check the lab if Dr Arbutus Ped will add the order to the iron treatment plan orders.

## 2023-06-15 NOTE — Telephone Encounter (Signed)
[facility] called verbal orders for PT  2 week 4, 1 week 2 add OT eval please advise orders for what is allowed as far as movement ans what he is wanting, please advise Vinnie Langton [phone] okay to LVM

## 2023-06-16 ENCOUNTER — Ambulatory Visit: Payer: PPO

## 2023-06-16 ENCOUNTER — Other Ambulatory Visit: Payer: Self-pay

## 2023-06-16 VITALS — BP 154/73 | HR 75 | Temp 98.3°F | Resp 24 | Ht 66.0 in | Wt 151.0 lb

## 2023-06-16 DIAGNOSIS — D509 Iron deficiency anemia, unspecified: Secondary | ICD-10-CM | POA: Diagnosis not present

## 2023-06-16 DIAGNOSIS — D500 Iron deficiency anemia secondary to blood loss (chronic): Secondary | ICD-10-CM

## 2023-06-16 MED ORDER — ACETAMINOPHEN 325 MG PO TABS
650.0000 mg | ORAL_TABLET | Freq: Once | ORAL | Status: AC
  Administered 2023-06-16: 650 mg via ORAL
  Filled 2023-06-16: qty 2

## 2023-06-16 MED ORDER — DIPHENHYDRAMINE HCL 25 MG PO CAPS
25.0000 mg | ORAL_CAPSULE | Freq: Once | ORAL | Status: AC
  Administered 2023-06-16: 25 mg via ORAL
  Filled 2023-06-16: qty 1

## 2023-06-16 MED ORDER — IRON SUCROSE 300 MG IVPB - SIMPLE MED
300.0000 mg | Freq: Once | Status: AC
  Administered 2023-06-16: 300 mg via INTRAVENOUS

## 2023-06-16 NOTE — Telephone Encounter (Signed)
left wrist range of motion and left shoulder passive range of motion below 90 degrees of forward flexion and 90 degrees of abduction. No active motion. No lifting/strengthening

## 2023-06-16 NOTE — Progress Notes (Signed)
Diagnosis: Iron Deficiency Anemia  Provider:  Chilton Greathouse MD  Procedure: IV Infusion  IV Type: Peripheral, IV Location: R Forearm  Venofer (Iron Sucrose), Dose: 300 mg  Infusion Start Time: 1342  Infusion Stop Time: 1515  Post Infusion IV Care: Observation period completed and Peripheral IV Discontinued  Discharge: Condition: Good, Destination: Home . AVS Provided  Performed by:  Wyvonne Lenz, RN

## 2023-06-16 NOTE — Telephone Encounter (Signed)
Called and advised.

## 2023-06-17 ENCOUNTER — Telehealth: Payer: Self-pay | Admitting: Orthopedic Surgery

## 2023-06-17 NOTE — Telephone Encounter (Signed)
Verbal given 

## 2023-06-17 NOTE — Telephone Encounter (Signed)
Margaretann Loveless [facility] has verbal order for OT to continue for one time a week for 4 weeks. CB#[phone]

## 2023-06-19 ENCOUNTER — Telehealth: Payer: Self-pay | Admitting: Orthopedic Surgery

## 2023-06-19 ENCOUNTER — Other Ambulatory Visit: Payer: Self-pay | Admitting: Physician Assistant

## 2023-06-19 DIAGNOSIS — C3490 Malignant neoplasm of unspecified part of unspecified bronchus or lung: Secondary | ICD-10-CM

## 2023-06-19 NOTE — Telephone Encounter (Signed)
Dena from [facility] called home health visits for 1 wk for 4 wks, and wound care orders.CB#[phone]

## 2023-06-20 DIAGNOSIS — C3490 Malignant neoplasm of unspecified part of unspecified bronchus or lung: Secondary | ICD-10-CM | POA: Diagnosis not present

## 2023-06-20 DIAGNOSIS — J449 Chronic obstructive pulmonary disease, unspecified: Secondary | ICD-10-CM | POA: Diagnosis not present

## 2023-06-20 DIAGNOSIS — J9610 Chronic respiratory failure, unspecified whether with hypoxia or hypercapnia: Secondary | ICD-10-CM | POA: Diagnosis not present

## 2023-06-21 ENCOUNTER — Encounter: Payer: Self-pay | Admitting: Nurse Practitioner

## 2023-06-21 DIAGNOSIS — R8290 Unspecified abnormal findings in urine: Secondary | ICD-10-CM | POA: Insufficient documentation

## 2023-06-21 DIAGNOSIS — S42302A Unspecified fracture of shaft of humerus, left arm, initial encounter for closed fracture: Secondary | ICD-10-CM | POA: Insufficient documentation

## 2023-06-21 DIAGNOSIS — R319 Hematuria, unspecified: Secondary | ICD-10-CM | POA: Insufficient documentation

## 2023-06-21 NOTE — Assessment & Plan Note (Signed)
She has persistent urinary symptoms she is encouraged to take AZO to help with this.

## 2023-06-21 NOTE — Assessment & Plan Note (Signed)
Likely related to her kidney stones.

## 2023-06-21 NOTE — Assessment & Plan Note (Signed)
Occurred after a fall, she has an appt with Ortho within the next couple weeks. She is having difficulty with her ADLs. Will place a referral for Home Health Aid and PT/OT

## 2023-06-21 NOTE — Assessment & Plan Note (Signed)
Urinalysis was negative for nitrates and awaiting urine culture. After reviewing Urology notes she was to f/u to see what other options they had to treat her frequent symptoms and kidney stones. She does not recall that conversation. She plans to call and schedule an appt.

## 2023-06-22 ENCOUNTER — Ambulatory Visit: Payer: Self-pay

## 2023-06-22 NOTE — Telephone Encounter (Signed)
Called and advised.

## 2023-06-22 NOTE — Telephone Encounter (Signed)
Okay for that duration/frequency. No need for wound care

## 2023-06-22 NOTE — Patient Outreach (Signed)
Care Coordination   Follow Up Visit Note   06/22/2023 Name: Abigail [name] MRN: [id number] DOB: October 05, 1935  Abigail [name] is a 87 y.o. year old female who sees [name], FNP for primary care. I spoke with  [name] by phone today.  What matters to the patients health and wellness today?  Patient would like to feel stronger and have UTI resolve without complications.    Goals Addressed             This Visit's Progress    COMPLETED: To avoid complications from reoccurring urinary tract infections       See duplicate goal      To have urinary frequency and hematuria futher evaluated   On track    Care Coordination Interventions: Evaluation of current treatment plan related to urinary frequency w/hematuria  and patient's adherence to plan as established by provider Reviewed and discussed with patient her completed PCP follow up for evaluation of UTI Reviewed medications with patient and discussed importance of medication adherence Discussed with patient her symptoms have improved since starting the antibiotic treatment, she reports the hematuria has subsided Confirmed patient has a home health nurse coming in for weekly visits  Discussed plans with patient for ongoing care coordination follow up and provided patient with direct contact information for nurse care coordinator     To manage fatigue while staying physically active   On track    Care Coordination Interventions: Provided written and verbal education re: potential causes of falls and Fall prevention strategies Advised patient of importance of notifying provider of falls Assessed for falls since last encounter Assessed patients knowledge of fall risk prevention secondary to previously provided education    Interventions Today    Flowsheet Row Most Recent Value  Chronic Disease   Chronic disease during today's visit Other  [UTI,  wrist/shoulder fx,  weakness]  General Interventions   General Interventions  Discussed/Reviewed General Interventions Discussed, General Interventions Reviewed, Labs, Doctor Visits  Doctor Visits Discussed/Reviewed Doctor Visits Discussed, Doctor Visits Reviewed, PCP, Specialist  Exercise Interventions   Exercise Discussed/Reviewed Physical Activity  Physical Activity Discussed/Reviewed Physical Activity Reviewed, Physical Activity Discussed, Home Exercise Program (HEP)  Education Interventions   Education Provided Provided Education  Provided Verbal Education On When to see the doctor, Medication, Exercise  Pharmacy Interventions   Pharmacy Dicussed/Reviewed Pharmacy Topics Discussed, Pharmacy Topics Reviewed, Medications and their functions  Safety Interventions   Safety Discussed/Reviewed Home Safety, Fall Risk, Safety Reviewed, Safety Discussed  Home Safety Assistive Devices          SDOH assessments and interventions completed:  No     Care Coordination Interventions:  Yes, provided   Follow up plan: Follow up call scheduled for 07/20/23 @2 :30 PM     Encounter Outcome:  Patient Visit Completed

## 2023-06-22 NOTE — Patient Instructions (Signed)
Visit Information  Thank you for taking time to visit with me today. Please don't hesitate to contact me if I can be of assistance to you.   Following are the goals we discussed today:   Goals Addressed             This Visit's Progress    COMPLETED: To avoid complications from reoccurring urinary tract infections       See duplicate goal      To have urinary frequency and hematuria futher evaluated   On track    Care Coordination Interventions: Evaluation of current treatment plan related to urinary frequency w/hematuria  and patient's adherence to plan as established by provider Reviewed and discussed with patient her completed PCP follow up for evaluation of UTI Reviewed medications with patient and discussed importance of medication adherence Discussed with patient her symptoms have improved since starting the antibiotic treatment, she reports the hematuria has subsided Confirmed patient has a home health nurse coming in for weekly visits  Discussed plans with patient for ongoing care coordination follow up and provided patient with direct contact information for nurse care coordinator     To manage fatigue while staying physically active   On track    Care Coordination Interventions: Provided written and verbal education re: potential causes of falls and Fall prevention strategies Advised patient of importance of notifying provider of falls Assessed for falls since last encounter Assessed patients knowledge of fall risk prevention secondary to previously provided education        Our next appointment is by telephone on 07/20/23 at 2:30 PM  Please call the care guide team at [phone] if you need to cancel or reschedule your appointment.   If you are experiencing a Mental Health or Behavioral Health Crisis or need someone to talk to, please call 1-800-273-TALK (toll free, 24 hour hotline)  The patient verbalized understanding of instructions, educational materials, and  care plan provided today and DECLINED offer to receive copy of patient instructions, educational materials, and care plan.   Delsa Sale RN BSN CCM [place]  [facility], [facility] Nurse Care Coordinator  Direct Dial: [phone] Website: [email or web address]

## 2023-06-23 ENCOUNTER — Ambulatory Visit (INDEPENDENT_AMBULATORY_CARE_PROVIDER_SITE_OTHER): Payer: PPO

## 2023-06-23 VITALS — BP 166/67 | HR 69 | Temp 97.5°F | Resp 26 | Ht 66.0 in | Wt 151.0 lb

## 2023-06-23 DIAGNOSIS — D509 Iron deficiency anemia, unspecified: Secondary | ICD-10-CM | POA: Diagnosis not present

## 2023-06-23 DIAGNOSIS — D500 Iron deficiency anemia secondary to blood loss (chronic): Secondary | ICD-10-CM

## 2023-06-23 MED ORDER — DIPHENHYDRAMINE HCL 25 MG PO CAPS
25.0000 mg | ORAL_CAPSULE | Freq: Once | ORAL | Status: AC
  Administered 2023-06-23: 25 mg via ORAL
  Filled 2023-06-23: qty 1

## 2023-06-23 MED ORDER — ACETAMINOPHEN 325 MG PO TABS
650.0000 mg | ORAL_TABLET | Freq: Once | ORAL | Status: AC
  Administered 2023-06-23: 650 mg via ORAL
  Filled 2023-06-23: qty 2

## 2023-06-23 MED ORDER — IRON SUCROSE 300 MG IVPB - SIMPLE MED
300.0000 mg | Freq: Once | Status: AC
  Administered 2023-06-23: 300 mg via INTRAVENOUS
  Filled 2023-06-23: qty 265

## 2023-06-23 NOTE — Progress Notes (Signed)
Diagnosis: Acute Anemia  Provider:  Chilton Greathouse MD  Procedure: IV Infusion  IV Type: Peripheral, IV Location: R Forearm  Venofer (Iron Sucrose), Dose: 300 mg  Infusion Start Time: 1327  Infusion Stop Time: 1513  Post Infusion IV Care: Observation period completed and Peripheral IV Discontinued  Discharge: Condition: Good, Destination: Home . AVS Declined  Performed by:  Wyvonne Lenz, RN

## 2023-06-29 ENCOUNTER — Telehealth: Payer: Self-pay

## 2023-06-29 NOTE — Telephone Encounter (Signed)
Transition Care Management Follow-up Telephone Call Date of discharge and from where: Abigail [name] 9/27 How have you been since you were released from the hospital? Pt has been following up with Ortho. Any questions or concerns? No  Items Reviewed: Did the pt receive and understand the discharge instructions provided? Yes  Medications obtained and verified? Yes  Other? No  Any new allergies since your discharge? No  Dietary orders reviewed? No Do you have support at home? Yes     Follow up appointments reviewed:  PCP Hospital f/u appt confirmed? No  Scheduled to see  on  @ . Specialist Hospital f/u appt confirmed? Yes  Scheduled to see ORTHO on  @ . Are transportation arrangements needed? No  If their condition worsens, is the pt aware to call PCP or go to the Emergency Dept.? Yes Was the patient provided with contact information for the PCP's office or ED? Yes Was to pt encouraged to call back with questions or concerns? Yes

## 2023-06-30 ENCOUNTER — Ambulatory Visit: Payer: PPO

## 2023-06-30 VITALS — BP 162/64 | HR 73 | Temp 97.9°F | Resp 24 | Ht 66.0 in | Wt 153.0 lb

## 2023-06-30 DIAGNOSIS — D509 Iron deficiency anemia, unspecified: Secondary | ICD-10-CM | POA: Diagnosis not present

## 2023-06-30 DIAGNOSIS — D500 Iron deficiency anemia secondary to blood loss (chronic): Secondary | ICD-10-CM

## 2023-06-30 MED ORDER — IRON SUCROSE 300 MG IVPB - SIMPLE MED
300.0000 mg | Freq: Once | Status: AC
  Administered 2023-06-30: 300 mg via INTRAVENOUS
  Filled 2023-06-30: qty 265

## 2023-06-30 MED ORDER — ACETAMINOPHEN 325 MG PO TABS
650.0000 mg | ORAL_TABLET | Freq: Once | ORAL | Status: AC
  Administered 2023-06-30: 650 mg via ORAL
  Filled 2023-06-30: qty 2

## 2023-06-30 MED ORDER — DIPHENHYDRAMINE HCL 25 MG PO CAPS
25.0000 mg | ORAL_CAPSULE | Freq: Once | ORAL | Status: AC
Start: 2023-06-30 — End: 2023-06-30
  Administered 2023-06-30: 25 mg via ORAL
  Filled 2023-06-30: qty 1

## 2023-06-30 NOTE — Progress Notes (Signed)
Diagnosis: Acute Anemia  Provider:  Chilton Greathouse MD  Procedure: IV Infusion  IV Type: Peripheral, IV Location: R Hand  Venofer (Iron Sucrose), Dose: 300 mg  Infusion Start Time: 1230  Infusion Stop Time: 1411  Post Infusion IV Care: Observation period completed and Peripheral IV Discontinued  Discharge: Condition: Good, Destination: Home . AVS Provided  Performed by:  Wyvonne Lenz, RN

## 2023-07-01 ENCOUNTER — Other Ambulatory Visit (INDEPENDENT_AMBULATORY_CARE_PROVIDER_SITE_OTHER): Payer: Self-pay

## 2023-07-01 ENCOUNTER — Ambulatory Visit: Payer: PPO | Admitting: Orthopedic Surgery

## 2023-07-01 ENCOUNTER — Other Ambulatory Visit (INDEPENDENT_AMBULATORY_CARE_PROVIDER_SITE_OTHER): Payer: PPO

## 2023-07-01 DIAGNOSIS — M25512 Pain in left shoulder: Secondary | ICD-10-CM

## 2023-07-01 DIAGNOSIS — M25532 Pain in left wrist: Secondary | ICD-10-CM

## 2023-07-04 ENCOUNTER — Encounter: Payer: Self-pay | Admitting: Orthopedic Surgery

## 2023-07-04 NOTE — Progress Notes (Signed)
Post-Op Visit Note   Patient: Abigail [name]           Date of Birth: [birth year]           MRN: [id number] Visit Date: 07/01/2023 PCP: [name], FNP   Assessment & Plan:  Chief Complaint:  Chief Complaint  Patient presents with   Left Shoulder - Follow-up    DOI: 05/22/23   Left Wrist - Follow-up    DOI: 05/22/23   Visit Diagnoses:  1. Acute pain of left shoulder   2. Pain in left wrist     Plan: Abigail [name] is a patient who follows up with left proximal humerus fracture left wrist fracture.  Date of injury 05/22/2023.  She has been in a sling and using a Velcro splint.  Overall she is improving.  Reports some aching with shoulder movement.  On examination she has fracture moving as a unit with passive and active range of motion.  Left wrist has good range of motion and minimal tenderness over the styloid.  Radiographs show callus formation present in both areas.  Overall the swelling has diminished in both the shoulder and the wrist.  I think we can follow-up with [facility] as needed.  She has made good progress and is functional with use of that left arm at this time.  Follow-Up Instructions: No follow-ups on file.   Orders:  Orders Placed This Encounter  Procedures   XR Shoulder Left   XR Wrist 2 Views Left   No orders of the defined types were placed in this encounter.   Imaging: No results found.  PMFS History: Patient Active Problem List   Diagnosis Date Noted   Foul smelling urine 06/21/2023   Hematuria 06/21/2023   Closed fracture of left upper limb 06/21/2023   Asymptomatic microscopic hematuria 04/08/2023   Right wrist pain 02/25/2023   Supplemental oxygen dependent 02/25/2023   Chronic pain of both knees 02/25/2023   Atherosclerosis of aorta (HCC) 02/25/2023   Hiatal hernia 11/20/2022   Generalized weakness 11/20/2022   Chronic cough 10/22/2022   Nodule of kidney 09/15/2022   Left ureteral stone 09/07/2022   Gross hematuria 09/07/2022   Chronic hypoxic  respiratory failure (HCC) 09/07/2022   Nephrolithiasis 09/07/2022   Staghorn calculus 09/07/2022   Chronic iron deficiency anemia 04/21/2022   Anemia 02/13/2022   Encounter for antineoplastic immunotherapy 11/20/2021   COPD (chronic obstructive pulmonary disease) (HCC) 08/20/2021   Dysphagia 08/20/2021   History of lung cancer 07/31/2021   Malignant neoplasm of unspecified part of unspecified bronchus or lung (HCC) 07/25/2021   Encounter for antineoplastic chemotherapy 07/25/2021   Mediastinal adenopathy 07/15/2021   Coronary artery disease involving native coronary artery of native heart with other form of angina pectoris (HCC) 04/11/2021   Right bundle branch block 04/11/2021   Newly recognized heart murmur 04/11/2021   Occult blood positive stool 11/09/2020   Chest pain with moderate risk for cardiac etiology 12/01/2019   Urinary urgency 12/01/2019   Anxiety 02/09/2019   Prediabetes 12/29/2018   Essential hypertension 08/27/2018   Vitamin D deficiency 06/25/2018   Hyperlipidemia 06/25/2018   Acquired hypothyroidism 06/25/2018   Benign hypertension with CKD (chronic kidney disease) stage III (HCC) 06/25/2018   Stage 3b chronic kidney disease (CKD) (HCC) 06/25/2018   OAB (overactive bladder) 08/26/2016   UTI (urinary tract infection) 04/05/2012   LLQ abdominal pain 04/05/2012   Hyponatremia 04/05/2012   Past Medical History:  Diagnosis Date   Anemia    Arthritis  knees, back   Bacteremia 04/08/2012   CAP (community acquired pneumonia) 04/12/2012   Carotid artery occlusion    Chronic kidney disease    stage 3 ckd no nephrologist   Complete uterine prolapse with prolapse of anterior vaginal wall    Complication of anesthesia    hard to wake up per pt   Constipation    Coronary artery disease    Diverticulitis yrs ago coialitis   Dyspnea    History of blood transfusion    History of radiation therapy    right lung 08/05/2021-09/18/2021  Dr Antony Blackbird   Hypertension     Hypothyroid    Lung cancer (HCC)    Numbness    in hands at times   Pneumonia    Pre-diabetes    Scoliosis    STEMI (ST elevation myocardial infarction) (HCC) 10/26/2019   DES RCA   Wears dentures    full dentures   Wears glasses    for reading    Family History  Problem Relation Age of Onset   Hypertension Mother    Colon cancer Neg Hx    Stomach cancer Neg Hx    Esophageal cancer Neg Hx     Past Surgical History:  Procedure Laterality Date   ANTERIOR AND POSTERIOR REPAIR WITH SACROSPINOUS FIXATION N/A 12/20/2020   Procedure: SACROSPINOUS LIGAMENT FIXATION;  Surgeon: Marguerita Beards, MD;  Location: [facility];  Service: Gynecology;  Laterality: N/A;  Total time requested for all procedures is 2 hours   BACK SURGERY  1980 age 56   lower   bartholin cyst removal  age 45's   BLADDER SUSPENSION N/A 12/20/2020   Procedure: TRANSVAGINAL TAPE (TVT) PROCEDURE;  Surgeon: Marguerita Beards, MD;  Location: [facility];  Service: Gynecology;  Laterality: N/A;   BRONCHIAL BRUSHINGS  07/15/2021   Procedure: BRONCHIAL BRUSHINGS;  Surgeon: Leslye Peer, MD;  Location: [facility] ENDOSCOPY;  Service: Pulmonary;;   BRONCHIAL NEEDLE ASPIRATION BIOPSY  07/15/2021   Procedure: BRONCHIAL NEEDLE ASPIRATION BIOPSIES;  Surgeon: Leslye Peer, MD;  Location: [facility] ENDOSCOPY;  Service: Pulmonary;;   CORONARY/GRAFT ACUTE MI REVASCULARIZATION N/A 10/26/2019   Procedure: Coronary/Graft Acute MI Revascularization;  Surgeon: Tonny Bollman, MD;  Location: [facility] INVASIVE CV LAB;  Service: Cardiovascular;  Laterality: N/A;   CYSTOCELE REPAIR N/A 12/20/2020   Procedure: ANTERIOR AND POSTERIOR REPAIR WITH PERINEORRHAPHY;  Surgeon: Marguerita Beards, MD;  Location: [facility];  Service: Gynecology;  Laterality: N/A;   CYSTOSCOPY N/A 12/20/2020   Procedure: CYSTOSCOPY;  Surgeon: Marguerita Beards, MD;  Location: [facility];  Service:  Gynecology;  Laterality: N/A;   ELBOW SURGERY  1990's   left   ENDOBRONCHIAL ULTRASOUND N/A 07/15/2021   Procedure: ENDOBRONCHIAL ULTRASOUND;  Surgeon: Leslye Peer, MD;  Location: [facility] ENDOSCOPY;  Service: Pulmonary;  Laterality: N/A;   HEMOSTASIS CONTROL  07/15/2021   Procedure: HEMOSTASIS CONTROL;  Surgeon: Leslye Peer, MD;  Location: [facility] ENDOSCOPY;  Service: Pulmonary;;   LEFT HEART CATH AND CORONARY ANGIOGRAPHY N/A 10/26/2019   Procedure: LEFT HEART CATH AND CORONARY ANGIOGRAPHY;  Surgeon: Tonny Bollman, MD;  Location: [facility] INVASIVE CV LAB;  Service: Cardiovascular;  Laterality: N/A;   TONSILLECTOMY     Social History   Occupational History   Occupation: semi retired  Tobacco Use   Smoking status: Former    Current packs/day: 0.00    Average packs/day: 1.5 packs/day for 35.0 years (52.5 ttl pk-yrs)  Types: Cigarettes    Start date: 04/06/1951    Quit date: 04/05/1986    Years since quitting: 37.2   Smokeless tobacco: Never  Vaping Use   Vaping status: Never Used  Substance and Sexual Activity   Alcohol use: No   Drug use: No   Sexual activity: Not Currently    Birth control/protection: Post-menopausal

## 2023-07-08 ENCOUNTER — Telehealth: Payer: Self-pay | Admitting: Orthopedic Surgery

## 2023-07-08 NOTE — Telephone Encounter (Signed)
Morrie Sheldon (OT) [facility] called requesting verbal orders for 1 wk 3. Morrie Sheldon secure number is [phone].

## 2023-07-08 NOTE — Telephone Encounter (Signed)
Yes

## 2023-07-08 NOTE — Telephone Encounter (Signed)
Verbal ok given.

## 2023-07-20 ENCOUNTER — Ambulatory Visit: Payer: Self-pay

## 2023-07-21 DIAGNOSIS — J9610 Chronic respiratory failure, unspecified whether with hypoxia or hypercapnia: Secondary | ICD-10-CM | POA: Diagnosis not present

## 2023-07-21 DIAGNOSIS — C3490 Malignant neoplasm of unspecified part of unspecified bronchus or lung: Secondary | ICD-10-CM | POA: Diagnosis not present

## 2023-07-21 DIAGNOSIS — J449 Chronic obstructive pulmonary disease, unspecified: Secondary | ICD-10-CM | POA: Diagnosis not present

## 2023-07-21 NOTE — Patient Outreach (Signed)
Care Coordination   Follow Up Visit Note   07/20/2023 Name: Abigail [name] MRN: [id number] DOB: [birth year]  Abigail [name] is a 87 y.o. year old female who sees [name], FNP for primary care. I spoke with  Abigail [name] by phone today.  What matters to the patients health and wellness today?  Patient would like to remain free of recurrent UTI's and or falls.     Goals Addressed             This Visit's Progress    To have urinary frequency and hematuria futher evaluated   On track    Care Coordination Interventions: Evaluation of current treatment plan related to urinary frequency w/hematuria  and patient's adherence to plan as established by provider Reviewed medications with patient and discussed importance of medication adherence, added low dose Nitrofurantoin to medication profile as prescribed by urology Determined patient is experiencing urinary frequency, she declines wanting PCP follow up appointment at this time  Encouraged patient to seek medical attention promptly if symptoms persist or worsen and patient verbalizes understanding, she is able to repeat back signs/symptoms suggestive of UTI Reinforced the importance of increasing daily water intake to 48-64 oz daily unless otherwise directed Patient will continue to increase her daily water intake as directed Patient will report persistent urinary frequency and any/all symptoms suggestive of UTI to her doctor promptly Patient will continue to work with nurse care coordinator for chronic disease management and care coordination needs     To manage fatigue while staying physically active   On track    Care Coordination Interventions: Provided written and verbal education re: potential causes of falls and Fall prevention strategies Advised patient of importance of notifying provider of falls Assessed for falls since last encounter Assessed patients knowledge of fall risk prevention secondary to previously provided  education Determined patient has experienced no further falls since last contact, she is making a good recovery from wrist fracture and shoulder dislocation  Patient will use fall precautions as directed Patient will use her cane/walker to help with balance and support Patient will notify her doctor of any/all falls if they occur and or seek medical attention promptly  Patient will work with the SW to obtain resources for food insecurities Patient will keep her scheduled PCP follow up with [name] FNP set for 08/24/23 @11 :00 AM Patient will continue to work with nurse care coordinator for chronic disease management and care coordination needs    Interventions Today    Flowsheet Row Most Recent Value  Chronic Disease   Chronic disease during today's visit Other  [iron deficiency anemia,  s/p left shoulder fracture,  fatigue]  General Interventions   General Interventions Discussed/Reviewed General Interventions Discussed, General Interventions Reviewed, Doctor Visits, Communication with  Doctor Visits Discussed/Reviewed Doctor Visits Discussed, Doctor Visits Reviewed, Specialist, PCP  Communication with Social Work  Remigio Eisenmenger BSW re: food insecurities]  Exercise Interventions   Exercise Discussed/Reviewed Exercise Reviewed, Exercise Discussed, Physical Activity  Physical Activity Discussed/Reviewed Physical Activity Reviewed, Physical Activity Discussed, Home Exercise Program (HEP), Types of exercise  Education Interventions   Education Provided Provided Education  Provided Verbal Education On Exercise, When to see the doctor, Nutrition  Nutrition Interventions   Nutrition Discussed/Reviewed Nutrition Discussed, Nutrition Reviewed, Fluid intake  Pharmacy Interventions   Pharmacy Dicussed/Reviewed Pharmacy Topics Reviewed, Pharmacy Topics Discussed  Safety Interventions   Safety Discussed/Reviewed Safety Discussed, Safety Reviewed, Fall Risk, Home Safety  Home Safety Assistive  Devices  SDOH assessments and interventions completed:  Yes  SDOH Interventions Today    Flowsheet Row Most Recent Value  SDOH Interventions   Food Insecurity Interventions AMB Referral  [sent referral to SW Remigio Eisenmenger BSW]  Housing Interventions Intervention Not Indicated  Transportation Interventions Other (Comment)  [Patient was provided transportation benefits per her health plan]  Utilities Interventions Intervention Not Indicated        Care Coordination Interventions:  Yes, provided   Follow up plan: Referral made to Remigio Eisenmenger BSW for food insecurities Follow up call scheduled for 08/31/23 @2 :30 PM    Encounter Outcome:  Patient Visit Completed

## 2023-07-21 NOTE — Patient Instructions (Signed)
Visit Information  Thank you for taking time to visit with me today. Please don't hesitate to contact me if I can be of assistance to you.   Following are the goals we discussed today:   Goals Addressed             This Visit's Progress    To have urinary frequency and hematuria futher evaluated   On track    Care Coordination Interventions: Evaluation of current treatment plan related to urinary frequency w/hematuria  and patient's adherence to plan as established by provider Reviewed medications with patient and discussed importance of medication adherence, added low dose Nitrofurantoin to medication profile as prescribed by urology Determined patient is experiencing urinary frequency, she declines wanting PCP follow up appointment at this time  Encouraged patient to seek medical attention promptly if symptoms persist or worsen and patient verbalizes understanding, she is able to repeat back signs/symptoms suggestive of UTI Reinforced the importance of increasing daily water intake to 48-64 oz daily unless otherwise directed Patient will continue to increase her daily water intake as directed Patient will report persistent urinary frequency and any/all symptoms suggestive of UTI to her doctor promptly Patient will continue to work with nurse care coordinator for chronic disease management and care coordination needs     To manage fatigue while staying physically active   On track    Care Coordination Interventions: Provided written and verbal education re: potential causes of falls and Fall prevention strategies Advised patient of importance of notifying provider of falls Assessed for falls since last encounter Assessed patients knowledge of fall risk prevention secondary to previously provided education Determined patient has experienced no further falls since last contact, she is making a good recovery from wrist fracture and shoulder dislocation  Patient will use fall precautions  as directed Patient will use her cane/walker to help with balance and support Patient will notify her doctor of any/all falls if they occur and or seek medical attention promptly  Patient will work with the SW to obtain resources for food insecurities Patient will keep her scheduled PCP follow up with [name] FNP set for 08/24/23 @11 :00 AM Patient will continue to work with nurse care coordinator for chronic disease management and care coordination needs        Our next appointment is by telephone on 08/31/23 at 2:30 PM  Please call the care guide team at [phone] if you need to cancel or reschedule your appointment.   If you are experiencing a Mental Health or Behavioral Health Crisis or need someone to talk to, please call 1-800-273-TALK (toll free, 24 hour hotline)  The patient verbalized understanding of instructions, educational materials, and care plan provided today and DECLINED offer to receive copy of patient instructions, educational materials, and care plan.   Delsa Sale RN BSN CCM [place]  [facility], [facility] Nurse Care Coordinator  Direct Dial: [phone] Website: [email or web address]

## 2023-07-22 ENCOUNTER — Telehealth: Payer: Self-pay | Admitting: Orthopedic Surgery

## 2023-07-22 ENCOUNTER — Telehealth: Payer: Self-pay | Admitting: Primary Care

## 2023-07-22 NOTE — Telephone Encounter (Signed)
LM on secure VM okay for VO

## 2023-07-22 NOTE — Telephone Encounter (Signed)
Patient reports receiving a call from our office, no documentation that our office reached out to patient.  Can we please call her to follow-up on this.  She was last seen October by Babette Relic. Does not appear there are any outstanding test results.

## 2023-07-22 NOTE — Telephone Encounter (Signed)
Almira (PT) from [facility] called requesting continued pt for 1 wk 3 for balance and walking. Almira secure line is [phone].

## 2023-07-28 ENCOUNTER — Other Ambulatory Visit: Payer: Self-pay | Admitting: Nurse Practitioner

## 2023-07-28 DIAGNOSIS — R079 Chest pain, unspecified: Secondary | ICD-10-CM

## 2023-07-30 NOTE — Telephone Encounter (Signed)
Per patient she has not received a call from office.Closing message.

## 2023-08-03 ENCOUNTER — Telehealth: Payer: Self-pay | Admitting: Orthopedic Surgery

## 2023-08-03 IMAGING — CT CT CHEST W/O CM
2 of 4 series · 15 of 36 positions shown, 18 images · non-contrast
Comparison: Most recent CT chest 07/09/2021.  07/09/2021 PET-CT.

CLINICAL DATA: Primary Cancer Type: Lung
TECHNIQUE: Multidetector CT imaging of the chest was performed following the
standard protocol without IV contrast.

RADIATION DOSE REDUCTION: This exam was performed according to the
departmental dose-optimization program which includes automated
exposure control, adjustment of the mA and/or kV according to
patient size and/or use of iterative reconstruction technique.

[Series 2: thorax · axial · 0.80mm/px · z∈[+1525,+1789]mm · 12 of 156 slices shown, 15 images]
[im 12/156  mediastinal]
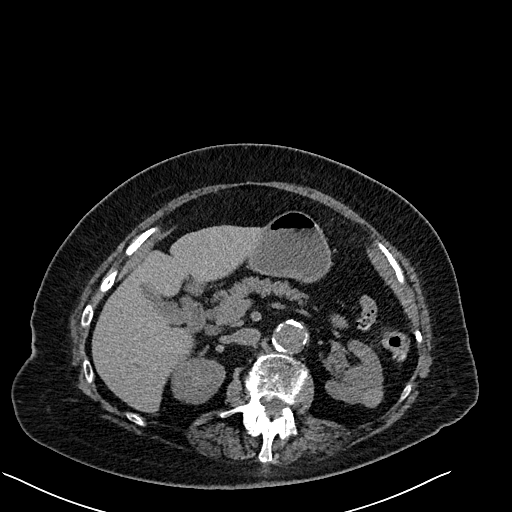
[im 12/156  lung]
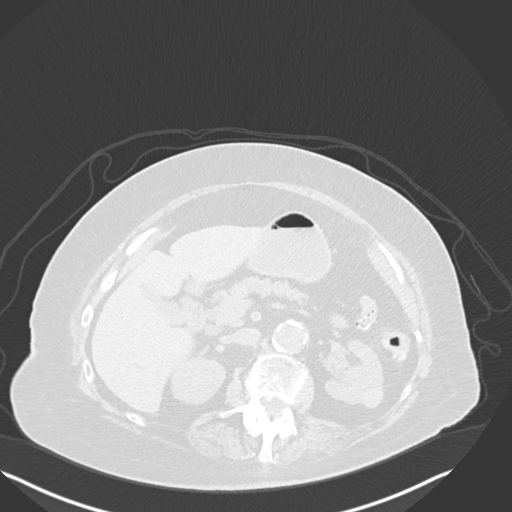
[im 23/156  lung]
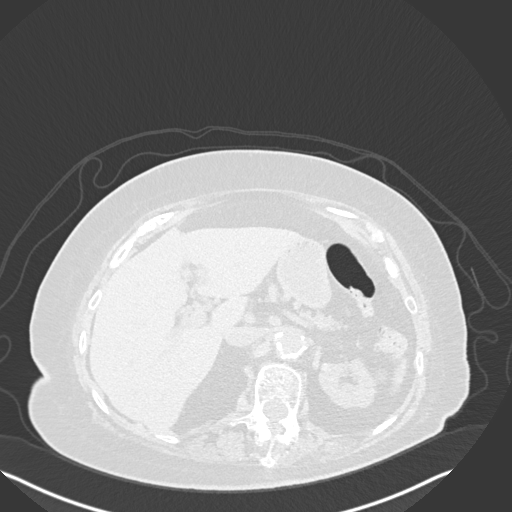
[im 34/156  lung]
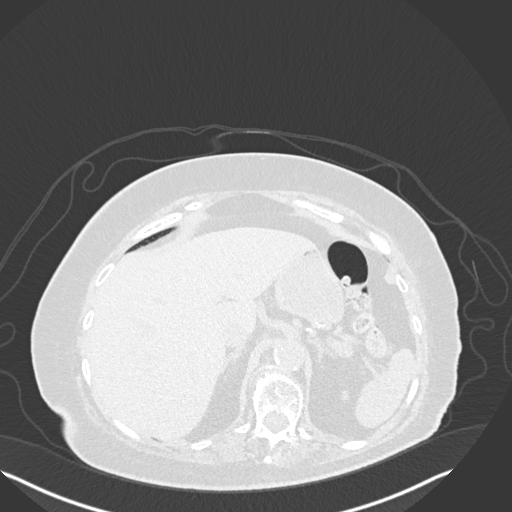
[im 45/156  lung]
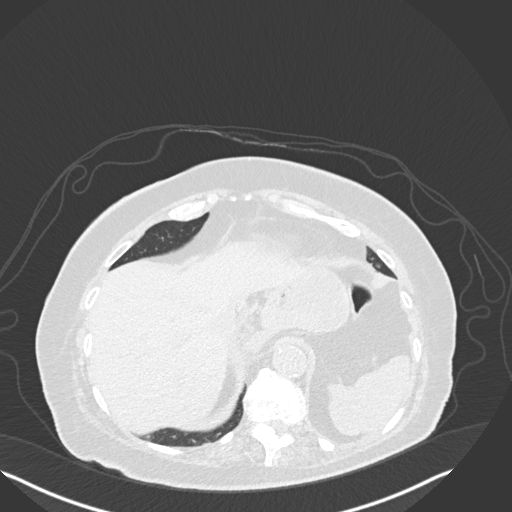
[im 56/156  mediastinal]
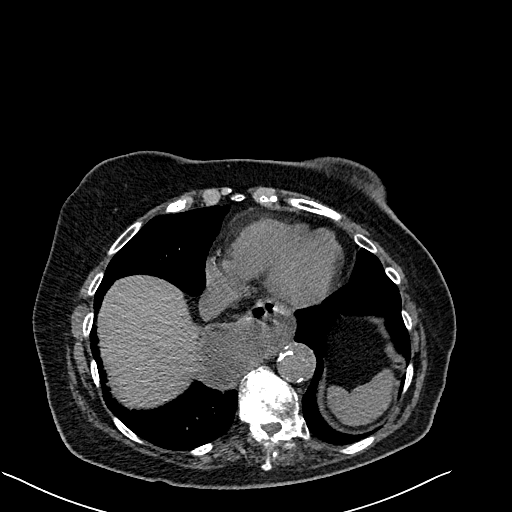
[im 56/156  lung]
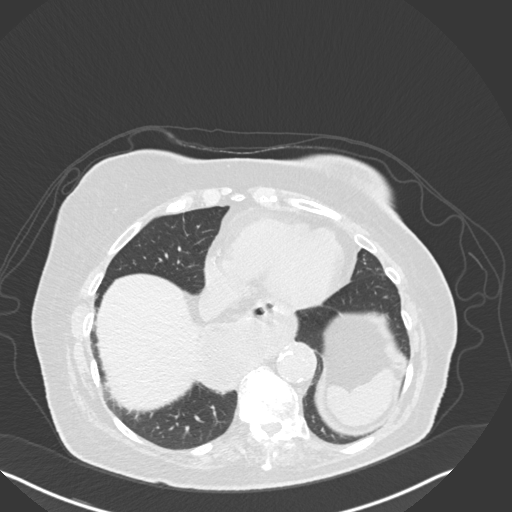
[im 67/156  lung]
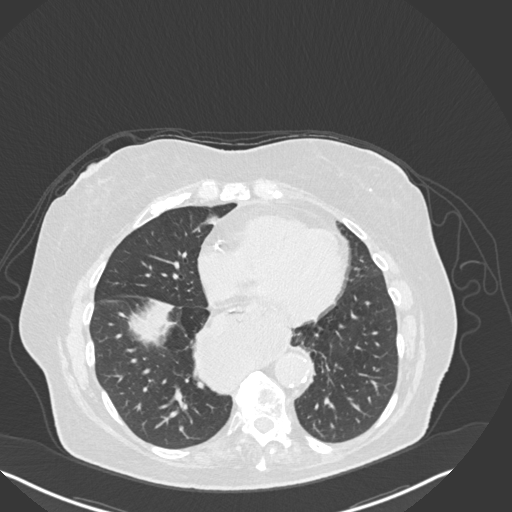
[im 89/156  lung]
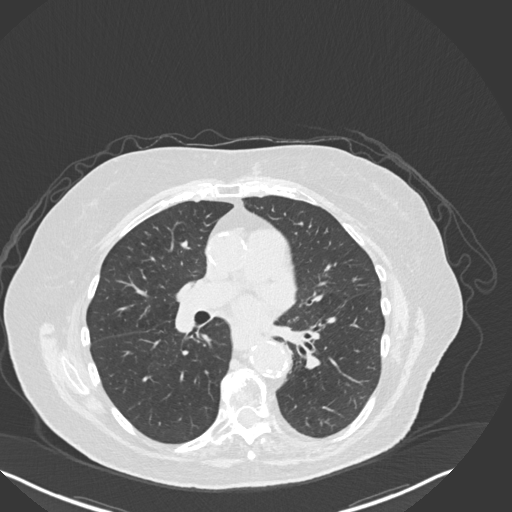
[im 100/156  lung]
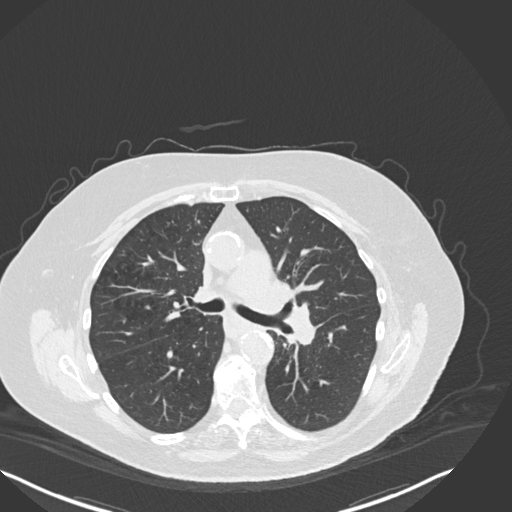
[im 111/156  mediastinal]
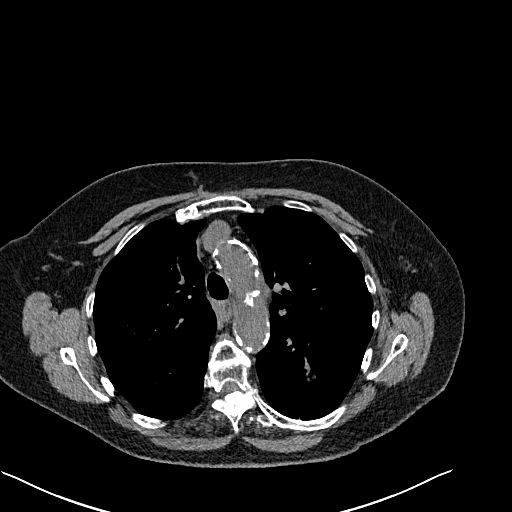
[im 111/156  lung]
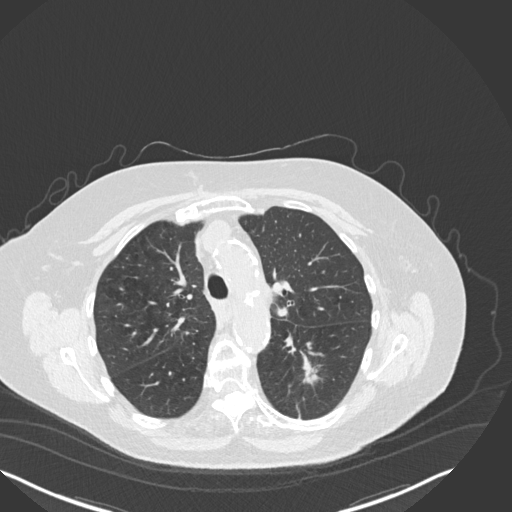
[im 122/156  lung]
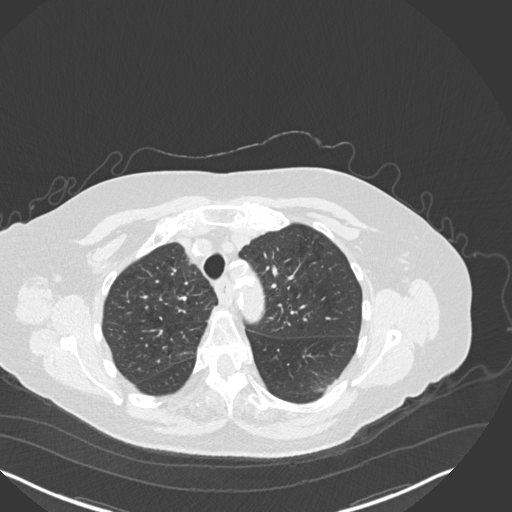
[im 133/156  lung]
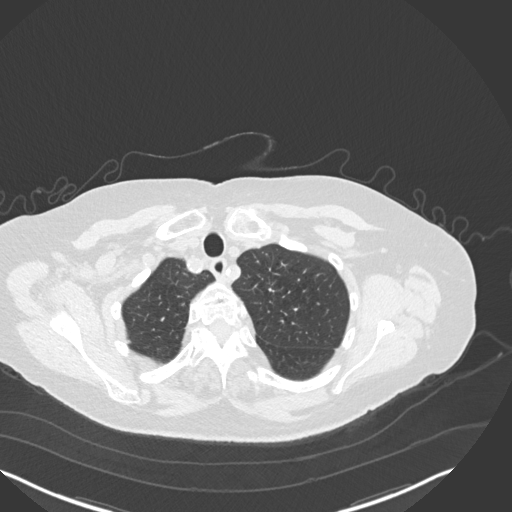
[im 144/156  lung]
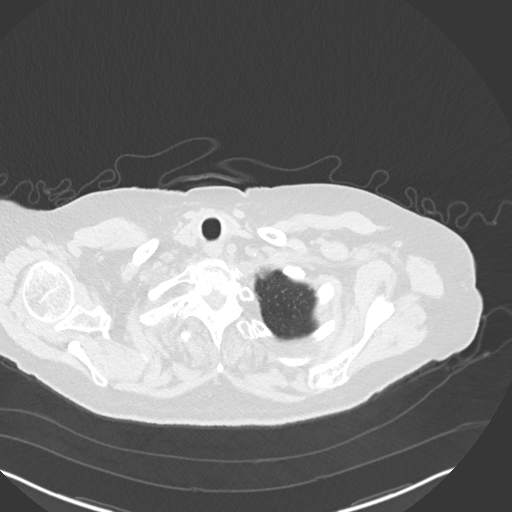

[Series 6: coronal · coronal · 0.67mm/px · 3 of 114 slices shown]
[im 23/114  lung]
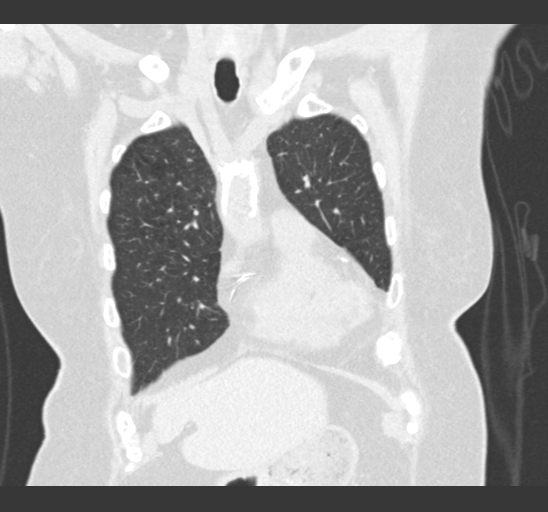
[im 46/114  lung]
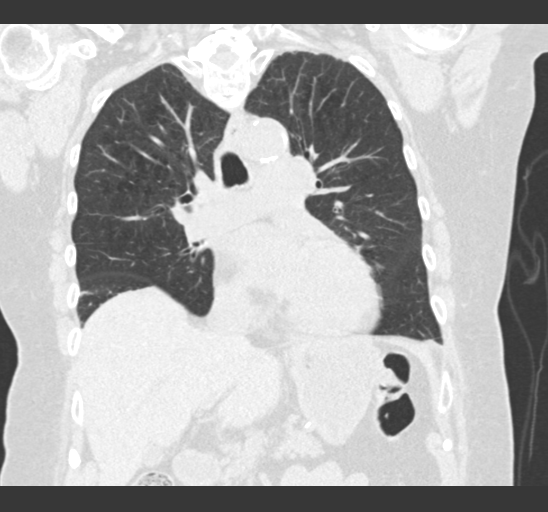
[im 68/114  lung]
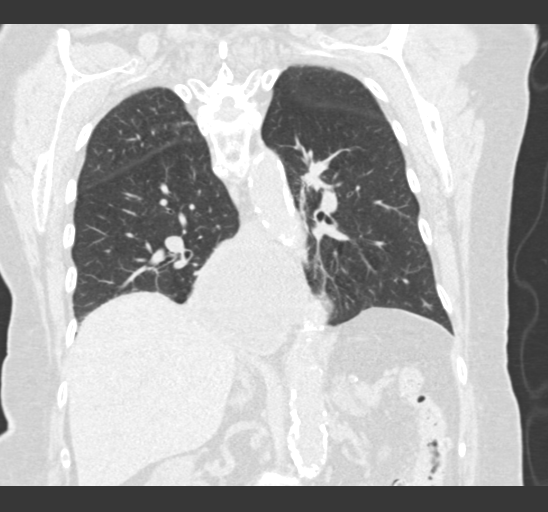

[15 of 36 positions shown; findings below may reference images not displayed]

Imaging Indication: Assess response to therapy

Interval therapy since last imaging? Yes

Initial Cancer Diagnosis

Date: 07/15/2021; Established by: Biopsy-proven

Detailed Pathology: Stage IIIA non-small cell lung cancer,
adenocarcinoma.

Primary Tumor location: Left lower lobe.

Surgeries: Coronary graft 8989.

Chemotherapy: Yes; Ongoing? Yes; Most recent administration:
09/10/2021

Immunotherapy? No

Radiation therapy? Yes; Date Range: 08/06/2021 - 09/18/2021; Target:
Left lung

EXAM:
CT CHEST WITHOUT CONTRAST
FINDINGS: Cardiovascular: Coronary, aortic arch, and branch vessel
atherosclerotic vascular disease.

Mediastinum/Nodes: Moderate to large hiatal hernia. Circumferential
distal esophageal wall thickening is nonspecific although
esophagitis would be a common cause. Continued abnormal soft tissue
density fullness of the left hilum compatible with left hilar and
infrahilar adenopathy, although difficult to measure separate from
the vessels on today's noncontrast exam. Previously hypermetabolic
left lower paratracheal lymph node 0.7 cm in short axis on image 50
series 2, previously similar.

Lungs/Pleura: The left lower lobe nodule measures 2.5 by 2.1 by
cm (volume = 5.2 cm^3), and on 07/09/2021 this lesion measured
by 2.7 by 3.3 cm (volume = 15 cm^3), a 65% reduction in volume.

Centrilobular emphysema. Stable scarring in the right middle lobe
and right lower lobe.

Upper Abdomen: Exophytic lesions of varying density of the left
kidney including some hyperdense lesions, probably complex cysts
although technically nonspecific on today's noncontrast examination.
Nonobstructive left nephrolithiasis. Abdominal aortic
atherosclerotic calcification.

Musculoskeletal: Thoracic spondylosis.
IMPRESSION: 1. 65% reduction in volume of the left lower lobe nodule. Persistent
left hilar and infrahilar adenopathy.
2.  Aortic Atherosclerosis (CHTGJ-HTE.E).  Coronary atherosclerosis.
3. Moderate to large hiatal hernia.
4. Circumferential distal esophageal wall thickening is nonspecific
although esophagitis would be a common cause.
5.  Emphysema (CHTGJ-TCS.6).
6. Exophytic lesions of varying density left kidney, including
hyperdense lesions, probably complex cysts but technically
nonspecific on today's noncontrast examination.

## 2023-08-03 NOTE — Telephone Encounter (Signed)
Morrie Sheldon (OT) from [facility] called requesting verbal orders for 1wk 2. Morrie Sheldon secure number is [phone].

## 2023-08-03 NOTE — Telephone Encounter (Signed)
IC LMVM advising ok for orders.  

## 2023-08-05 ENCOUNTER — Encounter: Payer: PPO | Admitting: Licensed Clinical Social Worker

## 2023-08-06 DIAGNOSIS — R310 Gross hematuria: Secondary | ICD-10-CM | POA: Diagnosis not present

## 2023-08-06 DIAGNOSIS — N200 Calculus of kidney: Secondary | ICD-10-CM | POA: Diagnosis not present

## 2023-08-07 ENCOUNTER — Ambulatory Visit: Payer: Self-pay | Admitting: Licensed Clinical Social Worker

## 2023-08-07 NOTE — Patient Outreach (Signed)
  Care Coordination   Initial Visit Note   08/07/2023 Name: Abigail [name] MRN: [id number] DOB: Mar 10, 1936  Abigail [name] is a 87 y.o. year old female who sees [name], FNP for primary care. I spoke with  [name] by phone today.  What matters to the patients health and wellness today?  Food Insecurities, Ensure coupons and transportation      Goals Addressed             This Visit's Progress    Care Coordination Activities       Care Coordination Interventions: Patient stated that she has MOW and needed the SW to find her some coupons for ensure and to also mail food pantry resource list Patient stated that her son lives with her and he drives but she sometimes she has to call on friends to get to medical appointments. Patient stated that she was told that he insurance will pay for limited rides and that she is going to call her insurance to find out.  SW will follow up 09/01/2023 at 10:00 am        SDOH assessments and interventions completed:  Yes  SDOH Interventions Today    Flowsheet Row Most Recent Value  SDOH Interventions   Housing Interventions Intervention Not Indicated  Transportation Interventions Intervention Not Indicated, Other (Comment)  Utilities Interventions Intervention Not Indicated        Care Coordination Interventions:  Yes, provided  Interventions Today    Flowsheet Row Most Recent Value  General Interventions   General Interventions Discussed/Reviewed General Interventions Discussed, Community Resources  [Food Resources and transportation]        Follow up plan: Follow up call scheduled for 09/01/2023 at 10:am     Encounter Outcome:  Patient Visit Completed  Jeanie Cooks, PhD [facility], [facility] Social Worker Direct Dial: [phone]  Fax: [phone]

## 2023-08-07 NOTE — Patient Instructions (Signed)
Visit Information  Thank you for taking time to visit with me today. Please don't hesitate to contact me if I can be of assistance to you.   Following are the goals we discussed today:   Goals Addressed             This Visit's Progress    Care Coordination Activities       Care Coordination Interventions: Patient stated that she has MOW and needed the SW to find her some coupons for ensure and to also mail food pantry resource list Patient stated that her son lives with her and he drives but she sometimes she has to call on friends to get to medical appointments. Patient stated that she was told that he insurance will pay for limited rides and that she is going to call her insurance to find out.  SW will follow up 09/01/2023 at 10:00 am        Our next appointment is by telephone on 09/01/2023 at 10:00 am  Please call the care guide team at [phone] if you need to cancel or reschedule your appointment.   If you are experiencing a Mental Health or Behavioral Health Crisis or need someone to talk to, please call the Suicide and Crisis Lifeline: 988 go to [facility] Urgent [facility] [address], [place] [phone]) call 911   Jeanie Cooks, PhD [facility], [facility] Social Worker Direct Dial: [phone]  Fax: [phone]

## 2023-08-10 ENCOUNTER — Telehealth: Payer: Self-pay | Admitting: Orthopedic Surgery

## 2023-08-10 NOTE — Telephone Encounter (Signed)
Abigail [name] (OT) from [facility] called again requesting additional orders for either recert or new eval for pt. Pt care is ending in 2 days and need to continue. [place] secure phone number is [phone].

## 2023-08-11 ENCOUNTER — Telehealth: Payer: Self-pay | Admitting: Orthopedic Surgery

## 2023-08-11 NOTE — Telephone Encounter (Signed)
Abigail [name] from [facility] called in again requesting orders please advise per last message her care ends tomorrow

## 2023-08-11 NOTE — Telephone Encounter (Signed)
IC verbal given.  

## 2023-08-11 NOTE — Telephone Encounter (Signed)
Okay for orders to continue OT and then transition to HEP as therapist sees fit

## 2023-08-17 ENCOUNTER — Telehealth: Payer: Self-pay | Admitting: Orthopedic Surgery

## 2023-08-17 NOTE — Telephone Encounter (Signed)
Verbal given 

## 2023-08-17 NOTE — Telephone Encounter (Signed)
Morrie Sheldon (OT) from [facility] called requesting verbal orders for continued OT for 1wk 4. Morrie Sheldon secure number is [phone].

## 2023-08-20 DIAGNOSIS — C3490 Malignant neoplasm of unspecified part of unspecified bronchus or lung: Secondary | ICD-10-CM | POA: Diagnosis not present

## 2023-08-20 DIAGNOSIS — J449 Chronic obstructive pulmonary disease, unspecified: Secondary | ICD-10-CM | POA: Diagnosis not present

## 2023-08-20 DIAGNOSIS — J9610 Chronic respiratory failure, unspecified whether with hypoxia or hypercapnia: Secondary | ICD-10-CM | POA: Diagnosis not present

## 2023-08-24 ENCOUNTER — Ambulatory Visit (INDEPENDENT_AMBULATORY_CARE_PROVIDER_SITE_OTHER): Payer: PPO | Admitting: Nurse Practitioner

## 2023-08-24 ENCOUNTER — Encounter: Payer: Self-pay | Admitting: Nurse Practitioner

## 2023-08-24 VITALS — BP 100/70 | HR 77 | Temp 97.7°F | Ht 66.0 in | Wt 151.4 lb

## 2023-08-24 DIAGNOSIS — E782 Mixed hyperlipidemia: Secondary | ICD-10-CM

## 2023-08-24 DIAGNOSIS — I5032 Chronic diastolic (congestive) heart failure: Secondary | ICD-10-CM

## 2023-08-24 DIAGNOSIS — Z79899 Other long term (current) drug therapy: Secondary | ICD-10-CM | POA: Diagnosis not present

## 2023-08-24 DIAGNOSIS — N1832 Chronic kidney disease, stage 3b: Secondary | ICD-10-CM

## 2023-08-24 DIAGNOSIS — R3915 Urgency of urination: Secondary | ICD-10-CM

## 2023-08-24 DIAGNOSIS — I130 Hypertensive heart and chronic kidney disease with heart failure and stage 1 through stage 4 chronic kidney disease, or unspecified chronic kidney disease: Secondary | ICD-10-CM | POA: Diagnosis not present

## 2023-08-24 DIAGNOSIS — I700 Atherosclerosis of aorta: Secondary | ICD-10-CM

## 2023-08-24 DIAGNOSIS — N3001 Acute cystitis with hematuria: Secondary | ICD-10-CM

## 2023-08-24 DIAGNOSIS — E039 Hypothyroidism, unspecified: Secondary | ICD-10-CM | POA: Diagnosis not present

## 2023-08-24 DIAGNOSIS — R7303 Prediabetes: Secondary | ICD-10-CM

## 2023-08-24 DIAGNOSIS — N3281 Overactive bladder: Secondary | ICD-10-CM

## 2023-08-24 DIAGNOSIS — I129 Hypertensive chronic kidney disease with stage 1 through stage 4 chronic kidney disease, or unspecified chronic kidney disease: Secondary | ICD-10-CM | POA: Insufficient documentation

## 2023-08-24 LAB — POCT URINALYSIS DIP (CLINITEK)
Bilirubin, UA: NEGATIVE
Glucose, UA: NEGATIVE mg/dL
Ketones, POC UA: NEGATIVE mg/dL
Nitrite, UA: POSITIVE — AB
POC PROTEIN,UA: 30 — AB
Spec Grav, UA: 1.015 (ref 1.010–1.025)
Urobilinogen, UA: 0.2 U/dL
pH, UA: 7.5 (ref 5.0–8.0)

## 2023-08-24 NOTE — Patient Instructions (Signed)
Hypertension, Adult Hypertension is another name for high blood pressure. High blood pressure forces your heart to work harder to pump blood. This can cause problems over time. There are two numbers in a blood pressure reading. There is a top number (systolic) over a bottom number (diastolic). It is best to have a blood pressure that is below 120/80. What are the causes? The cause of this condition is not known. Some other conditions can lead to high blood pressure. What increases the risk? Some lifestyle factors can make you more likely to develop high blood pressure: Smoking. Not getting enough exercise or physical activity. Being overweight. Having too much fat, sugar, calories, or salt (sodium) in your diet. Drinking too much alcohol. Other risk factors include: Having any of these conditions: Heart disease. Diabetes. High cholesterol. Kidney disease. Obstructive sleep apnea. Having a family history of high blood pressure and high cholesterol. Age. The risk increases with age. Stress. What are the signs or symptoms? High blood pressure may not cause symptoms. Very high blood pressure (hypertensive crisis) may cause: Headache. Fast or uneven heartbeats (palpitations). Shortness of breath. Nosebleed. Vomiting or feeling like you may vomit (nauseous). Changes in how you see. Very bad chest pain. Feeling dizzy. Seizures. How is this treated? This condition is treated by making healthy lifestyle changes, such as: Eating healthy foods. Exercising more. Drinking less alcohol. Your doctor may prescribe medicine if lifestyle changes do not help enough and if: Your top number is above 130. Your bottom number is above 80. Your personal target blood pressure may vary. Follow these instructions at home: Eating and drinking  If told, follow the DASH eating plan. To follow this plan: Fill one half of your plate at each meal with fruits and vegetables. Fill one fourth of your plate  at each meal with whole grains. Whole grains include whole-wheat pasta, brown rice, and whole-grain bread. Eat or drink low-fat dairy products, such as skim milk or low-fat yogurt. Fill one fourth of your plate at each meal with low-fat (lean) proteins. Low-fat proteins include fish, chicken without skin, eggs, beans, and tofu. Avoid fatty meat, cured and processed meat, or chicken with skin. Avoid pre-made or processed food. Limit the amount of salt in your diet to less than 1,500 mg each day. Do not drink alcohol if: Your doctor tells you not to drink. You are pregnant, may be pregnant, or are planning to become pregnant. If you drink alcohol: Limit how much you have to: 0-1 drink a day for women. 0-2 drinks a day for men. Know how much alcohol is in your drink. In the [place] one drink equals one 12 oz bottle of beer (355 mL), one 5 oz glass of wine (148 mL), or one 1 oz glass of hard liquor (44 mL). Lifestyle  Work with your doctor to stay at a healthy weight or to lose weight. Ask your doctor what the best weight is for you. Get at least 30 minutes of exercise that causes your heart to beat faster (aerobic exercise) most days of the week. This may include walking, swimming, or biking. Get at least 30 minutes of exercise that strengthens your muscles (resistance exercise) at least 3 days a week. This may include lifting weights or doing Pilates. Do not smoke or use any products that contain nicotine or tobacco. If you need help quitting, ask your doctor. Check your blood pressure at home as told by your doctor. Keep all follow-up visits. Medicines Take over-the-counter and prescription medicines   only as told by your doctor. Follow directions carefully. Do not skip doses of blood pressure medicine. The medicine does not work as well if you skip doses. Skipping doses also puts you at risk for problems. Ask your doctor about side effects or reactions to medicines that you should watch  for. Contact a doctor if: You think you are having a reaction to the medicine you are taking. You have headaches that keep coming back. You feel dizzy. You have swelling in your ankles. You have trouble with your vision. Get help right away if: You get a very bad headache. You start to feel mixed up (confused). You feel weak or numb. You feel faint. You have very bad pain in your: Chest. Belly (abdomen). You vomit more than once. You have trouble breathing. These symptoms may be an emergency. Get help right away. Call 911. Do not wait to see if the symptoms will go away. Do not drive yourself to the hospital. Summary Hypertension is another name for high blood pressure. High blood pressure forces your heart to work harder to pump blood. For most people, a normal blood pressure is less than 120/80. Making healthy choices can help lower blood pressure. If your blood pressure does not get lower with healthy choices, you may need to take medicine. This information is not intended to replace advice given to you by your health care provider. Make sure you discuss any questions you have with your health care provider. Document Revised: 05/30/2021 Document Reviewed: 05/30/2021 Elsevier Patient Education  2024 Elsevier Inc.  

## 2023-08-25 ENCOUNTER — Encounter: Payer: Self-pay | Admitting: Internal Medicine

## 2023-08-25 LAB — CBC
Hematocrit: 33.8 % — ABNORMAL LOW (ref 34.0–46.6)
Hemoglobin: 10.8 g/dL — ABNORMAL LOW (ref 11.1–15.9)
MCH: 28.9 pg (ref 26.6–33.0)
MCHC: 32 g/dL (ref 31.5–35.7)
MCV: 90 fL (ref 79–97)
Platelets: 262 10*3/uL (ref 150–450)
RBC: 3.74 x10E6/uL — ABNORMAL LOW (ref 3.77–5.28)
RDW: 15.8 % — ABNORMAL HIGH (ref 11.7–15.4)
WBC: 6.5 10*3/uL (ref 3.4–10.8)

## 2023-08-25 LAB — CMP14+EGFR
ALT: 13 [IU]/L (ref 0–32)
AST: 15 [IU]/L (ref 0–40)
Albumin: 4.2 g/dL (ref 3.7–4.7)
Alkaline Phosphatase: 134 [IU]/L — ABNORMAL HIGH (ref 44–121)
BUN/Creatinine Ratio: 21 (ref 12–28)
BUN: 30 mg/dL — ABNORMAL HIGH (ref 8–27)
Bilirubin Total: 0.3 mg/dL (ref 0.0–1.2)
CO2: 22 mmol/L (ref 20–29)
Calcium: 9.4 mg/dL (ref 8.7–10.3)
Chloride: 102 mmol/L (ref 96–106)
Creatinine, Ser: 1.4 mg/dL — ABNORMAL HIGH (ref 0.57–1.00)
Globulin, Total: 3 g/dL (ref 1.5–4.5)
Glucose: 98 mg/dL (ref 70–99)
Potassium: 5.2 mmol/L (ref 3.5–5.2)
Sodium: 139 mmol/L (ref 134–144)
Total Protein: 7.2 g/dL (ref 6.0–8.5)
eGFR: 37 mL/min/{1.73_m2} — ABNORMAL LOW (ref >59)

## 2023-08-25 LAB — LIPID PANEL
Chol/HDL Ratio: 5.4 {ratio} — ABNORMAL HIGH (ref 0.0–4.4)
Cholesterol, Total: 234 mg/dL — ABNORMAL HIGH (ref 100–199)
HDL: 43 mg/dL (ref >39)
LDL Chol Calc (NIH): 161 mg/dL — ABNORMAL HIGH (ref 0–99)
Triglycerides: 164 mg/dL — ABNORMAL HIGH (ref 0–149)
VLDL Cholesterol Cal: 30 mg/dL (ref 5–40)

## 2023-08-25 LAB — TSH+FREE T4
Free T4: 1.8 ng/dL — ABNORMAL HIGH (ref 0.82–1.77)
TSH: 0.172 u[IU]/mL — ABNORMAL LOW (ref 0.450–4.500)

## 2023-08-25 LAB — HEMOGLOBIN A1C
Est. average glucose Bld gHb Est-mCnc: 120 mg/dL
Hgb A1c MFr Bld: 5.8 % — ABNORMAL HIGH (ref 4.8–5.6)

## 2023-08-25 NOTE — Assessment & Plan Note (Signed)
Will check urinalysis, positive for nitrates will await urine culture to treat for proper bacteria

## 2023-08-25 NOTE — Assessment & Plan Note (Signed)
Continue follow up with urology

## 2023-08-25 NOTE — Assessment & Plan Note (Signed)
Cholesterol levels are stable.  Continue statin, tolerating well.

## 2023-08-25 NOTE — Assessment & Plan Note (Signed)
 He has a previous history of urinary tract infections.  She is currently on antibiotics to prevent a urinary tract infection her urine analysis checked.  It was positive for nitrates however I am going to send it for culture to determine what bacteria needs to be treated.

## 2023-08-25 NOTE — Assessment & Plan Note (Signed)
Stable, continue focusing on well hydration

## 2023-08-25 NOTE — Assessment & Plan Note (Signed)
Thyroid levels are normal.  Continue current medications.

## 2023-08-25 NOTE — Assessment & Plan Note (Signed)
 Continue statin, tolerating well

## 2023-08-25 NOTE — Assessment & Plan Note (Signed)
Hemoglobin A1c is stable.  Continue to focus on diet control

## 2023-08-26 LAB — URINE CULTURE

## 2023-08-28 ENCOUNTER — Telehealth: Payer: Self-pay | Admitting: Orthopedic Surgery

## 2023-08-28 NOTE — Telephone Encounter (Signed)
 Got it. Looks like she saw her PCP several days ago with concern for intermittent elevated BP. They are aware. If BP elevation persists, she should reach out to her PCP

## 2023-08-28 NOTE — Telephone Encounter (Signed)
 Morrie Sheldon from [facility] called and said patient blood pressure 180/77. CB#[phone]

## 2023-08-31 ENCOUNTER — Ambulatory Visit: Payer: Self-pay

## 2023-08-31 NOTE — Patient Outreach (Signed)
 Care Coordination   Follow Up Visit Note   08/31/2023 Name: Abigail [name] MRN: [id number] DOB: [birth year]  Abigail [name] is a 88 y.o. year old female who sees Abigail Speaks, [name] for primary care. I spoke with  Abigail [name] by phone today.  What matters to the patients health and wellness today?  Patient would [name] to start monitoring her BP at home as directed. She would [name] to receive additional Ensure Coupons if available.     Goals Addressed             This Visit's Progress    To have urinary frequency and hematuria futher evaluated   On track    Care Coordination Interventions: Evaluation of current treatment plan related to urinary frequency w/hematuria  and patient's adherence to plan as established by provider Discussed with patient Urology has resumed management of her chronic UTI, she completed a recent visit with Urology and PCP provider  Review of patient status, including review of consultant's reports, relevant laboratory and other test results, and medications completed and discussed importance of medication adherence  Encouraged patient to seek medical attention promptly if symptoms persist or worsen and patient verbalizes understanding, she is able to repeat back signs/symptoms suggestive of UTI Reinforced the importance of increasing daily water intake to 48-64 oz daily unless otherwise directed Patient will continue to increase her daily water intake as directed Patient will report persistent urinary frequency and any/all symptoms suggestive of UTI to her doctor promptly Patient will continue to work with nurse care coordinator for chronic disease management and care coordination needs     To improve thryoid levels       Care Coordination Interventions: Evaluation of current treatment plan related to hypothyroidism  and patient's adherence to plan as established by provider Review of patient status, including review of consultant's reports, relevant laboratory and  other test results, and medications completed Determined patient has not been taking her Levothyroxine  alone without other medications, she was taking with 2 other medications Educated patient on how to properly take her medication for best effectiveness as follows; Take Synthroid  once a day, every day at the same time before breakfast. Take Synthroid  with only water and on an empty stomach. Wait 30 minutes to 1 hour before eating or drinking anything other than water. Determined patient verbalizes understanding and is able to repeat back the instructions given Advised patient her PCP may call her to scheduled a lab visit for a recheck in the near future Sent in basket message to [name] Georgina [name] making her aware of the above stated Component Ref Range & Units (hover) 7 d ago (08/24/23)        TSH 0.172 Low         Free T4 1.80 High                Interventions Today    Flowsheet Row Most Recent Value  Chronic Disease   Chronic disease during today's visit Other, Hypertension (HTN), Chronic Kidney Disease/End Stage Renal Disease (ESRD)  [chronic UTI,  hypothyroidism]  General Interventions   General Interventions Discussed/Reviewed General Interventions Discussed, General Interventions Reviewed, Doctor Visits, Labs, Communication with  Doctor Visits Discussed/Reviewed Doctor Visits Discussed, Doctor Visits Reviewed, PCP, Specialist  Communication with PCP/Specialists, Social Work  Abigail [name],  Abigail [name]]  Education Interventions   Education Provided Provided Education  Provided Verbal Education On Medication, Labs, Nutrition, When to see the doctor  Labs Reviewed Kidney Function  [  TSH]  Nutrition Interventions   Nutrition Discussed/Reviewed Nutrition Discussed, Nutrition Reviewed, Fluid intake, Supplemental nutrition  Pharmacy Interventions   Pharmacy Dicussed/Reviewed Pharmacy Topics Discussed, Pharmacy Topics Reviewed, Medications and their functions, Medication  Adherence          SDOH assessments and interventions completed:  No     Care Coordination Interventions:  Yes, provided   Follow up plan: Follow up call scheduled for 10/29/23 @2 ;30 pm    Encounter Outcome:  Patient Visit Completed

## 2023-08-31 NOTE — Patient Instructions (Signed)
 Visit Information  Thank you for taking time to visit with me today. Please don't hesitate to contact me if I can be of assistance to you.   Following are the goals we discussed today:   Goals Addressed             This Visit's Progress    To have urinary frequency and hematuria futher evaluated   On track    Care Coordination Interventions: Evaluation of current treatment plan related to urinary frequency w/hematuria  and patient's adherence to plan as established by provider Discussed with patient Urology has resumed management of her chronic UTI, she completed a recent visit with Urology and PCP provider  Review of patient status, including review of consultant's reports, relevant laboratory and other test results, and medications completed and discussed importance of medication adherence  Encouraged patient to seek medical attention promptly if symptoms persist or worsen and patient verbalizes understanding, she is able to repeat back signs/symptoms suggestive of UTI Reinforced the importance of increasing daily water intake to 48-64 oz daily unless otherwise directed Patient will continue to increase her daily water intake as directed Patient will report persistent urinary frequency and any/all symptoms suggestive of UTI to her doctor promptly Patient will continue to work with nurse care coordinator for chronic disease management and care coordination needs     To improve thryoid levels       Care Coordination Interventions: Evaluation of current treatment plan related to hypothyroidism  and patient's adherence to plan as established by provider Review of patient status, including review of consultant's reports, relevant laboratory and other test results, and medications completed Determined patient has not been taking her Levothyroxine  alone without other medications, she was taking with 2 other medications Educated patient on how to properly take her medication for best  effectiveness as follows; Take Synthroid  once a day, every day at the same time before breakfast. Take Synthroid  with only water and on an empty stomach. Wait 30 minutes to 1 hour before eating or drinking anything other than water. Determined patient verbalizes understanding and is able to repeat back the instructions given Advised patient her PCP may call her to scheduled a lab visit for a recheck in the near future Sent in basket message to [name] FNP making her aware of the above stated Component Ref Range & Units (hover) 7 d ago (08/24/23)        TSH 0.172 Low         Free T4 1.80 High                    Our next appointment is by telephone on 10/29/23 at 2:30 PM  Please call the care guide team at [phone] if you need to cancel or reschedule your appointment.   If you are experiencing a Mental Health or Behavioral Health Crisis or need someone to talk to, please call 1-800-273-TALK (toll free, 24 hour hotline)  The patient verbalized understanding of instructions, educational materials, and care plan provided today and DECLINED offer to receive copy of patient instructions, educational materials, and care plan.   Clayborne Ly RN BSN CCM [place]  [facility], [facility] Nurse Care Coordinator  Direct Dial: [phone] Website: [email or web address]

## 2023-09-01 ENCOUNTER — Ambulatory Visit: Payer: Self-pay | Admitting: Licensed Clinical Social Worker

## 2023-09-01 NOTE — Patient Outreach (Signed)
  Care Coordination   Follow Up Visit Note   09/01/2023 Name: Abigail [name] MRN: [id number] DOB: 20-Jan-1936  Abigail [name] is a 88 y.o. year old female who sees Abigail Speaks, FNP for primary care. I spoke with  Abigail [name] by phone today.  What matters to the patients health and wellness today?  Transportation and Ensure Coupons    Goals Addressed             This Visit's Progress    Care Coordination Activities       Care Coordination Interventions: Patient stated that she has MOW and needed the SW to find her some coupons for ensure  Patient is receiving 30 hours of transportation from [facility] and may need something when that runs out. SW stated that she will do one more follow up call to make sure that has received all the resources mailed and then when closed out the patient can have her PCP to refer her back to the Care Management team to be put back on the SW schedule. SW will follow up 10/02/2023 at 10:00 am        SDOH assessments and interventions completed:  Yes  SDOH Interventions Today    Flowsheet Row Most Recent Value  SDOH Interventions   Food Insecurity Interventions Intervention Not Indicated, Other (Comment)  [But would [name] some ensure coupons]  Housing Interventions Intervention Not Indicated  Transportation Interventions Other (Comment)  [Patient now has 30 hours [facility]]  Utilities Interventions Intervention Not Indicated        Care Coordination Interventions:  Yes, provided  Interventions Today    Flowsheet Row Most Recent Value  General Interventions   General Interventions Discussed/Reviewed General Interventions Reviewed, [employer] has 30 hours with [employer] for medical transportation]        Follow up plan: Follow up call scheduled for 10/02/2023 at 10:00 am    Encounter Outcome:  Patient Visit Completed   Abigail CHARM Maranda HEDWIG, PhD [facility], [facility] Social Worker Direct Dial: [phone]   Fax: [phone]

## 2023-09-01 NOTE — Patient Instructions (Signed)
 Visit Information  Thank you for taking time to visit with me today. Please don't hesitate to contact me if I can be of assistance to you.   Following are the goals we discussed today:   Goals Addressed             This Visit's Progress    Care Coordination Activities       Care Coordination Interventions: Patient stated that she has MOW and needed the SW to find her some coupons for ensure  Patient is receiving 30 hours of transportation from [facility] and may need something when that runs out. SW stated that she will do one more follow up call to make sure that has received all the resources mailed and then when closed out the patient can have her PCP to refer her back to the Care Management team to be put back on the SW schedule. SW will follow up 10/02/2023 at 10:00 am        Our next appointment is by telephone on 10/02/2023 at 10:00 am  Please call the care guide team at [phone] if you need to cancel or reschedule your appointment.   If you are experiencing a Mental Health or Behavioral Health Crisis or need someone to talk to, please call the Suicide and Crisis Lifeline: 988 go to [facility] [address] [place] [phone]) call 911  The patient verbalized understanding of instructions, educational materials, and care plan provided today and DECLINED offer to receive copy of patient instructions, educational materials, and care plan.   Tobias CHARM Maranda HEDWIG, PhD [facility], [facility] Social Worker Direct Dial: [phone]  Fax: [phone]

## 2023-09-20 DIAGNOSIS — J9610 Chronic respiratory failure, unspecified whether with hypoxia or hypercapnia: Secondary | ICD-10-CM | POA: Diagnosis not present

## 2023-09-20 DIAGNOSIS — J449 Chronic obstructive pulmonary disease, unspecified: Secondary | ICD-10-CM | POA: Diagnosis not present

## 2023-09-20 DIAGNOSIS — C3490 Malignant neoplasm of unspecified part of unspecified bronchus or lung: Secondary | ICD-10-CM | POA: Diagnosis not present

## 2023-09-25 ENCOUNTER — Other Ambulatory Visit: Payer: Self-pay | Admitting: Physician Assistant

## 2023-09-25 DIAGNOSIS — E039 Hypothyroidism, unspecified: Secondary | ICD-10-CM

## 2023-09-29 ENCOUNTER — Encounter (HOSPITAL_COMMUNITY): Payer: Self-pay

## 2023-09-29 ENCOUNTER — Ambulatory Visit (HOSPITAL_COMMUNITY)
Admission: RE | Admit: 2023-09-29 | Discharge: 2023-09-29 | Disposition: A | Discharge status: Home / Self Care | Payer: PPO | Source: Ambulatory Visit | Attending: Internal Medicine | Admitting: Internal Medicine

## 2023-09-29 ENCOUNTER — Other Ambulatory Visit: Payer: Self-pay | Admitting: Obstetrics and Gynecology

## 2023-09-29 ENCOUNTER — Encounter: Payer: Self-pay | Admitting: Internal Medicine

## 2023-09-29 ENCOUNTER — Inpatient Hospital Stay: Payer: PPO | Attending: Internal Medicine

## 2023-09-29 ENCOUNTER — Encounter: Payer: Self-pay | Admitting: Physician Assistant

## 2023-09-29 DIAGNOSIS — Z85118 Personal history of other malignant neoplasm of bronchus and lung: Secondary | ICD-10-CM | POA: Insufficient documentation

## 2023-09-29 DIAGNOSIS — C3490 Malignant neoplasm of unspecified part of unspecified bronchus or lung: Secondary | ICD-10-CM | POA: Insufficient documentation

## 2023-09-29 DIAGNOSIS — N3281 Overactive bladder: Secondary | ICD-10-CM

## 2023-09-29 DIAGNOSIS — I700 Atherosclerosis of aorta: Secondary | ICD-10-CM | POA: Diagnosis not present

## 2023-09-29 DIAGNOSIS — D509 Iron deficiency anemia, unspecified: Secondary | ICD-10-CM

## 2023-09-29 DIAGNOSIS — J439 Emphysema, unspecified: Secondary | ICD-10-CM | POA: Diagnosis not present

## 2023-09-29 DIAGNOSIS — N390 Urinary tract infection, site not specified: Secondary | ICD-10-CM | POA: Insufficient documentation

## 2023-09-29 DIAGNOSIS — Z08 Encounter for follow-up examination after completed treatment for malignant neoplasm: Secondary | ICD-10-CM | POA: Insufficient documentation

## 2023-09-29 DIAGNOSIS — F419 Anxiety disorder, unspecified: Secondary | ICD-10-CM | POA: Diagnosis not present

## 2023-09-29 LAB — CBC WITH DIFFERENTIAL (CANCER CENTER ONLY)
Abs Immature Granulocytes: 0.01 10*3/uL (ref 0.00–0.07)
Basophils Absolute: 0 10*3/uL (ref 0.0–0.1)
Basophils Relative: 0 %
Eosinophils Absolute: 0.1 10*3/uL (ref 0.0–0.5)
Eosinophils Relative: 2 %
HCT: 31 % — ABNORMAL LOW (ref 36.0–46.0)
Hemoglobin: 9.7 g/dL — ABNORMAL LOW (ref 12.0–15.0)
Immature Granulocytes: 0 %
Lymphocytes Relative: 6 %
Lymphs Abs: 0.3 10*3/uL — ABNORMAL LOW (ref 0.7–4.0)
MCH: 30.1 pg (ref 26.0–34.0)
MCHC: 31.3 g/dL (ref 30.0–36.0)
MCV: 96.3 fL (ref 80.0–100.0)
Monocytes Absolute: 0.7 10*3/uL (ref 0.1–1.0)
Monocytes Relative: 11 %
Neutro Abs: 5 10*3/uL (ref 1.7–7.7)
Neutrophils Relative %: 81 %
Platelet Count: 214 10*3/uL (ref 150–400)
RBC: 3.22 MIL/uL — ABNORMAL LOW (ref 3.87–5.11)
RDW: 14 % (ref 11.5–15.5)
WBC Count: 6.2 10*3/uL (ref 4.0–10.5)
nRBC: 0 % (ref 0.0–0.2)

## 2023-09-29 LAB — CMP (CANCER CENTER ONLY)
ALT: 9 U/L (ref 0–44)
AST: 12 U/L — ABNORMAL LOW (ref 15–41)
Albumin: 3.9 g/dL (ref 3.5–5.0)
Alkaline Phosphatase: 94 U/L (ref 38–126)
Anion gap: 5 (ref 5–15)
BUN: 42 mg/dL — ABNORMAL HIGH (ref 8–23)
CO2: 26 mmol/L (ref 22–32)
Calcium: 9.3 mg/dL (ref 8.9–10.3)
Chloride: 104 mmol/L (ref 98–111)
Creatinine: 1.5 mg/dL — ABNORMAL HIGH (ref 0.44–1.00)
GFR, Estimated: 34 mL/min — ABNORMAL LOW (ref >60)
Glucose, Bld: 141 mg/dL — ABNORMAL HIGH (ref 70–99)
Potassium: 5 mmol/L (ref 3.5–5.1)
Sodium: 135 mmol/L (ref 135–145)
Total Bilirubin: 0.3 mg/dL (ref 0.0–1.2)
Total Protein: 7.3 g/dL (ref 6.5–8.1)

## 2023-10-02 ENCOUNTER — Telehealth: Payer: Self-pay | Admitting: *Deleted

## 2023-10-02 ENCOUNTER — Telehealth: Payer: Self-pay

## 2023-10-02 ENCOUNTER — Encounter: Payer: Self-pay | Admitting: *Deleted

## 2023-10-02 ENCOUNTER — Encounter: Payer: Self-pay | Admitting: Licensed Clinical Social Worker

## 2023-10-02 ENCOUNTER — Ambulatory Visit: Payer: Self-pay | Admitting: Licensed Clinical Social Worker

## 2023-10-02 DIAGNOSIS — R319 Hematuria, unspecified: Secondary | ICD-10-CM | POA: Diagnosis not present

## 2023-10-02 DIAGNOSIS — N390 Urinary tract infection, site not specified: Secondary | ICD-10-CM | POA: Diagnosis not present

## 2023-10-02 DIAGNOSIS — R8281 Pyuria: Secondary | ICD-10-CM | POA: Diagnosis not present

## 2023-10-02 NOTE — Patient Instructions (Signed)
 Visit Information  Thank you for taking time to visit with me today. Please don't hesitate to contact me if I can be of assistance to you.   Following are the goals we discussed today:   Goals Addressed             This Visit's Progress    COMPLETED: Care Coordination Activities       Care Coordination Interventions: Patient stated that she has MOW and needed the SW to find her some coupons for ensure  Patient is receiving 30 hours of transportation from [facility] and may need something when that runs out. SW stated that she will do one more follow up call to make sure that has received all the resources mailed and then when closed out the patient can have her PCP to refer her back to the Care Management team to be put back on the SW schedule. SW will follow up 10/02/2023 at 10:00 am        No further follow up needed, SW encouraged the patient to call the PCP   Please call the care guide team at [phone] if you need to cancel or reschedule your appointment.   If you are experiencing a Mental Health or Behavioral Health Crisis or need someone to talk to, please call the Suicide and Crisis Lifeline: 988 go to [name] [address], [place] [phone]) call 911  The patient verbalized understanding of instructions, educational materials, and care plan provided today and DECLINED offer to receive copy of patient instructions, educational materials, and care plan.   Abigail CHARM Maranda HEDWIG, PhD [facility], [facility] Social Worker Direct Dial: [phone]  Fax: [phone]

## 2023-10-02 NOTE — Patient Outreach (Signed)
  Care Coordination   Follow Up Visit Note   10/02/2023 Name: Abigail [name] MRN: [id number] DOB: [birth year]  Abigail [name] is a 88 y.o. year old female who sees [name], FNP for primary care. I spoke with  Abigail [name] by phone today.  What matters to the patients health and wellness today?  Ensure coupons   Goals Addressed             This Visit's Progress    COMPLETED: Care Coordination Activities       Care Coordination Interventions: Patient stated that she has MOW and needed the SW to find her some coupons for ensure  Patient is receiving 30 hours of transportation from [facility] and may need something when that runs out. SW stated that she will do one more follow up call to make sure that has received all the resources mailed and then when closed out the patient can have her PCP to refer her back to the Care Management team to be put back on the SW schedule. SW will follow up 10/02/2023 at 10:00 am        SDOH assessments and interventions completed:  Yes  SDOH Interventions Today    Flowsheet Row Most Recent Value  SDOH Interventions   Food Insecurity Interventions Intervention Not Indicated  Housing Interventions Intervention Not Indicated  Transportation Interventions Intervention Not Indicated  Utilities Interventions Intervention Not Indicated        Care Coordination Interventions:  Yes, provided  Interventions Today    Flowsheet Row Most Recent Value  General Interventions   General Interventions Discussed/Reviewed General Interventions Reviewed, Abigail [name] was satisfied with the ensure coupons received]        Follow up plan: No further intervention required.   Encounter Outcome:  No Answer   Tobias CHARM Maranda HEDWIG, PhD [facility], [facility] Social Worker Direct Dial: [phone]  Fax: [phone]

## 2023-10-02 NOTE — Telephone Encounter (Signed)
 Patient spoke with Abigail [name] from care coordination and stated she had blood and urine and was not feeling well- Patient was called and advised to reach out to her urologist, patient was also advised to go to urgent if she can not see them.

## 2023-10-02 NOTE — Patient Outreach (Signed)
  Care Coordination      Telephone Note   10/02/2023 Name: Abigail [name] MRN: [id number] DOB: Dec 06, 1935  Abigail [name] is a 88 y.o. year old female who sees [name], FNP for primary care. I spoke with  [name] by phone today after being asked by Tobias Moose, BSW to reach out to patient regarding hematuria. She has a history of CKD, recurrent UTIs, and kidney stones. Patient reports feeling faint last night and having difficulty going to sleep. She experienced one episode of hematuria this morning where she passed bright red blood. When questioned she did endorse mild dysuria. Denies abdominal, back, or flank pain. Denies fever or chills. She does complain of general weakness and malaise for several days.    What matters to the patients health and wellness today?  Finding cause for hematuria and weakness. Primary concern is that she may have a UTI.     Goals Addressed             This Visit's Progress    Evaluate Hematuria       Care Management Goals: Patient will seek evaluation at urgent care today Patient will follow-up with urologist and PCP as scheduled Patient will take medication as prescribed Patient will reach out to RN Care Manager at [phone] if needed        SDOH assessments and interventions completed:  No     Care Coordination Interventions:  Yes, provided  Interventions Today    Flowsheet Row Most Recent Value  Chronic Disease   Chronic disease during today's visit Other  [Recurrent UTIs with current symptoms of gross hematuria (this morning), mild dysuria, general malaise and weakness for several days.]  General Interventions   General Interventions Discussed/Reviewed General Interventions Discussed, General Interventions Reviewed, Labs, Doctor Visits, Communication with  Labs --  [urinalysis, urine culture]  Doctor Visits Discussed/Reviewed Doctor Visits Discussed, Doctor Visits Reviewed, PCP, Specialist  PCP/Specialist Visits Compliance with  follow-up visit  [Dr Marilynne (Urogynecologist) on 10/13/23. Speaks Georgina, FNP (PCP) on 12/22/23. Dr Gaston (urologist) on 02/04/24. May need to be see sooner.]  Communication with PCP/Specialists  [Staff message to PCP office regarding patient's current symptoms. Response received. Patient was advised to go to Urgent Care for evaluation if urologist is unable to see her today.]  Education Interventions   Education Provided Provided Education  Provided Verbal Education On Nutrition, Labs, When to see the doctor, Medication  Labs Reviewed --  [reviewed most recent urinalysis and urine culture results]  Nutrition Interventions   Nutrition Discussed/Reviewed Fluid intake, Nutrition Discussed, Nutrition Reviewed  [Encouraged to drink at least 48 oz of water each day]  Pharmacy Interventions   Pharmacy Dicussed/Reviewed Pharmacy Topics Discussed, Pharmacy Topics Reviewed, Medications and their functions  [taking macrobid  prophylactically daily]       Follow up plan: Follow up call scheduled for 10/05/23    Encounter Outcome:  Patient Visit Completed   Josette Pellet, RN, BSN [place]  [facility], [facility] RN Care Manager Direct Dial: [phone]

## 2023-10-05 ENCOUNTER — Ambulatory Visit: Payer: Self-pay | Admitting: *Deleted

## 2023-10-05 ENCOUNTER — Encounter: Payer: Self-pay | Admitting: *Deleted

## 2023-10-05 NOTE — Patient Outreach (Signed)
  Care Coordination   Follow Up Visit Note   10/05/2023 Name: Abigail [name] MRN: [id number] DOB: [birth year]  Abigail [name] is a 88 y.o. year old female who sees Abigail Epley, FNP for primary care. I spoke with  Carollee Circle by phone today.  What matters to the patients health and wellness today?  Resolving UTI    Goals Addressed             This Visit's Progress    COMPLETED: Evaluate Hematuria   On track    Care Management Goals: Patient will follow-up with urologist and PCP as scheduled Patient will take medication as prescribed Patient will seek medical attention for any new or worsening symptoms Patient will reach out to RN Care Manager at [phone] if needed        SDOH assessments and interventions completed:  Yes  SDOH Interventions Today    Flowsheet Row Most Recent Value  SDOH Interventions   Housing Interventions Intervention Not Indicated        Care Coordination Interventions:  Yes, provided  Interventions Today    Flowsheet Row Most Recent Value  Chronic Disease   Chronic disease during today's visit Other  [Recurrent UTIs, hematuria, and kidney stone]  General Interventions   General Interventions Discussed/Reviewed General Interventions Discussed, General Interventions Reviewed, Labs, Doctor Visits  [Discussed urgent care visit on 10/02/23. No records to review.]  Labs --  [patient reports hematuria on urinalysis at urgent care visit on 10/02/23]  Doctor Visits Discussed/Reviewed Doctor Visits Discussed, Doctor Visits Reviewed, PCP, Specialist  PCP/Specialist Visits Compliance with follow-up visit  [Dr Frutoso Jing (Urogynecologist) on 10/13/23. Abigail Epley, FNP (PCP) on 12/22/23. Dr Clarke Crouch (urologist) on 02/04/24]  Education Interventions   Education Provided Provided Education  Provided Verbal Education On Medication, When to see the doctor, Nutrition  [seek medication attention for any new or worsening symptoms]  Nutrition Interventions   Nutrition  Discussed/Reviewed Nutrition Discussed, Nutrition Reviewed, Fluid intake  Pharmacy Interventions   Pharmacy Dicussed/Reviewed Pharmacy Topics Discussed, Pharmacy Topics Reviewed, Medications and their functions  [Prescribed 7 days of Cipro  on 10/02/23]  Safety Interventions   Safety Discussed/Reviewed Safety Discussed, Safety Reviewed       Follow up plan: Follow up call scheduled for 10/22/23    Encounter Outcome:  Patient Visit Completed   Michele Ahle, RN, BSN [place]  [facility], [facility] RN Care Manager Direct Dial: [phone]

## 2023-10-06 ENCOUNTER — Inpatient Hospital Stay: Payer: PPO | Admitting: Internal Medicine

## 2023-10-06 VITALS — BP 159/67 | HR 65 | Temp 97.6°F | Resp 17 | Wt 156.4 lb

## 2023-10-06 DIAGNOSIS — C3490 Malignant neoplasm of unspecified part of unspecified bronchus or lung: Secondary | ICD-10-CM

## 2023-10-06 DIAGNOSIS — Z08 Encounter for follow-up examination after completed treatment for malignant neoplasm: Secondary | ICD-10-CM | POA: Diagnosis not present
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
## 2023-10-07 ENCOUNTER — Other Ambulatory Visit: Payer: Self-pay

## 2023-10-08 ENCOUNTER — Other Ambulatory Visit: Payer: Self-pay | Admitting: Nurse Practitioner

## 2023-10-08 DIAGNOSIS — E039 Hypothyroidism, unspecified: Secondary | ICD-10-CM

## 2023-10-11 ENCOUNTER — Encounter (HOSPITAL_COMMUNITY): Payer: Self-pay

## 2023-10-11 ENCOUNTER — Emergency Department (HOSPITAL_COMMUNITY): Payer: PPO

## 2023-10-11 ENCOUNTER — Other Ambulatory Visit: Payer: Self-pay

## 2023-10-11 ENCOUNTER — Observation Stay (HOSPITAL_COMMUNITY): Payer: PPO

## 2023-10-11 ENCOUNTER — Observation Stay (HOSPITAL_COMMUNITY)
Admission: EM | Admit: 2023-10-11 | Discharge: 2023-10-11 | Disposition: A | Discharge status: Home / Self Care | Payer: PPO | Attending: Emergency Medicine | Admitting: Emergency Medicine

## 2023-10-11 DIAGNOSIS — I129 Hypertensive chronic kidney disease with stage 1 through stage 4 chronic kidney disease, or unspecified chronic kidney disease: Secondary | ICD-10-CM | POA: Diagnosis not present

## 2023-10-11 DIAGNOSIS — C3490 Malignant neoplasm of unspecified part of unspecified bronchus or lung: Secondary | ICD-10-CM | POA: Diagnosis not present

## 2023-10-11 DIAGNOSIS — J90 Pleural effusion, not elsewhere classified: Secondary | ICD-10-CM | POA: Insufficient documentation

## 2023-10-11 DIAGNOSIS — E039 Hypothyroidism, unspecified: Secondary | ICD-10-CM | POA: Diagnosis present

## 2023-10-11 DIAGNOSIS — I10 Essential (primary) hypertension: Secondary | ICD-10-CM | POA: Diagnosis not present

## 2023-10-11 DIAGNOSIS — J432 Centrilobular emphysema: Secondary | ICD-10-CM | POA: Diagnosis not present

## 2023-10-11 DIAGNOSIS — R079 Chest pain, unspecified: Principal | ICD-10-CM | POA: Diagnosis present

## 2023-10-11 DIAGNOSIS — Z7982 Long term (current) use of aspirin: Secondary | ICD-10-CM | POA: Diagnosis not present

## 2023-10-11 DIAGNOSIS — D509 Iron deficiency anemia, unspecified: Secondary | ICD-10-CM | POA: Diagnosis present

## 2023-10-11 DIAGNOSIS — I25118 Atherosclerotic heart disease of native coronary artery with other forms of angina pectoris: Secondary | ICD-10-CM | POA: Diagnosis present

## 2023-10-11 DIAGNOSIS — N1832 Chronic kidney disease, stage 3b: Secondary | ICD-10-CM | POA: Diagnosis present

## 2023-10-11 DIAGNOSIS — Z85118 Personal history of other malignant neoplasm of bronchus and lung: Secondary | ICD-10-CM | POA: Insufficient documentation

## 2023-10-11 DIAGNOSIS — J941 Fibrothorax: Secondary | ICD-10-CM | POA: Diagnosis not present

## 2023-10-11 DIAGNOSIS — I213 ST elevation (STEMI) myocardial infarction of unspecified site: Secondary | ICD-10-CM | POA: Diagnosis not present

## 2023-10-11 DIAGNOSIS — R0789 Other chest pain: Secondary | ICD-10-CM | POA: Diagnosis not present

## 2023-10-11 DIAGNOSIS — Z79899 Other long term (current) drug therapy: Secondary | ICD-10-CM | POA: Insufficient documentation

## 2023-10-11 DIAGNOSIS — J9611 Chronic respiratory failure with hypoxia: Secondary | ICD-10-CM | POA: Diagnosis present

## 2023-10-11 DIAGNOSIS — I2510 Atherosclerotic heart disease of native coronary artery without angina pectoris: Secondary | ICD-10-CM | POA: Diagnosis present

## 2023-10-11 DIAGNOSIS — R0602 Shortness of breath: Secondary | ICD-10-CM | POA: Diagnosis not present

## 2023-10-11 DIAGNOSIS — J449 Chronic obstructive pulmonary disease, unspecified: Secondary | ICD-10-CM | POA: Diagnosis present

## 2023-10-11 LAB — CBC WITH DIFFERENTIAL/PLATELET
Abs Immature Granulocytes: 0.03 10*3/uL (ref 0.00–0.07)
Basophils Absolute: 0 10*3/uL (ref 0.0–0.1)
Basophils Relative: 1 %
Eosinophils Absolute: 0.2 10*3/uL (ref 0.0–0.5)
Eosinophils Relative: 3 %
HCT: 35.1 % — ABNORMAL LOW (ref 36.0–46.0)
Hemoglobin: 10.8 g/dL — ABNORMAL LOW (ref 12.0–15.0)
Immature Granulocytes: 1 %
Lymphocytes Relative: 7 %
Lymphs Abs: 0.5 10*3/uL — ABNORMAL LOW (ref 0.7–4.0)
MCH: 29.9 pg (ref 26.0–34.0)
MCHC: 30.8 g/dL (ref 30.0–36.0)
MCV: 97.2 fL (ref 80.0–100.0)
Monocytes Absolute: 0.7 10*3/uL (ref 0.1–1.0)
Monocytes Relative: 11 %
Neutro Abs: 5 10*3/uL (ref 1.7–7.7)
Neutrophils Relative %: 77 %
Platelets: 265 10*3/uL (ref 150–400)
RBC: 3.61 MIL/uL — ABNORMAL LOW (ref 3.87–5.11)
RDW: 13.9 % (ref 11.5–15.5)
WBC: 6.4 10*3/uL (ref 4.0–10.5)
nRBC: 0 % (ref 0.0–0.2)

## 2023-10-11 LAB — COMPREHENSIVE METABOLIC PANEL
ALT: 16 U/L (ref 0–44)
AST: 19 U/L (ref 15–41)
Albumin: 3.8 g/dL (ref 3.5–5.0)
Alkaline Phosphatase: 93 U/L (ref 38–126)
Anion gap: 8 (ref 5–15)
BUN: 38 mg/dL — ABNORMAL HIGH (ref 8–23)
CO2: 24 mmol/L (ref 22–32)
Calcium: 8.9 mg/dL (ref 8.9–10.3)
Chloride: 104 mmol/L (ref 98–111)
Creatinine, Ser: 1.43 mg/dL — ABNORMAL HIGH (ref 0.44–1.00)
GFR, Estimated: 36 mL/min — ABNORMAL LOW (ref >60)
Glucose, Bld: 113 mg/dL — ABNORMAL HIGH (ref 70–99)
Potassium: 4.4 mmol/L (ref 3.5–5.1)
Sodium: 136 mmol/L (ref 135–145)
Total Bilirubin: 0.2 mg/dL (ref 0.0–1.2)
Total Protein: 7.7 g/dL (ref 6.5–8.1)

## 2023-10-11 LAB — TROPONIN I (HIGH SENSITIVITY)
Troponin I (High Sensitivity): 10 ng/L (ref <18)
Troponin I (High Sensitivity): 10 ng/L (ref <18)

## 2023-10-11 LAB — BRAIN NATRIURETIC PEPTIDE: B Natriuretic Peptide: 261.1 pg/mL — ABNORMAL HIGH (ref 0.0–100.0)

## 2023-10-11 MED ORDER — IPRATROPIUM-ALBUTEROL 0.5-2.5 (3) MG/3ML IN SOLN
3.0000 mL | Freq: Once | RESPIRATORY_TRACT | Status: AC
  Administered 2023-10-11: 3 mL via RESPIRATORY_TRACT
  Filled 2023-10-11: qty 3

## 2023-10-11 MED ORDER — IOHEXOL 350 MG/ML SOLN
53.0000 mL | Freq: Once | INTRAVENOUS | Status: AC | PRN
  Administered 2023-10-11: 53 mL via INTRAVENOUS

## 2023-10-11 MED ORDER — METHYLPREDNISOLONE SODIUM SUCC 125 MG IJ SOLR
125.0000 mg | Freq: Every day | INTRAMUSCULAR | Status: DC
  Administered 2023-10-11: 125 mg via INTRAVENOUS
  Filled 2023-10-11: qty 2

## 2023-10-11 NOTE — ED Notes (Signed)
[facility] called, only [facility] waiting, next on first available

## 2023-10-11 NOTE — ED Provider Notes (Signed)
[place] EMERGENCY DEPARTMENT AT [facility] Provider Note   CSN: [id number] Arrival date & time: 10/11/23  1884     History  Chief Complaint  Patient presents with   Chest Pain    Pt woke up from sleep with sharp chest pain (left sided). Has pain during breathing as well. Pt on 3L at baseline. Hx of COPD and Lung cancer. No SOB. Pt a/o x4    Abigail [name] is a 88 y.o. female with h/o HTN, hypothyroidism, COPD on 3L [place] chronically, lung cancer without active treatment presents to the ER today for evaluation of point chest pain. She reports that she woke up around 0300 with this pain. It is point pain just under the left breast. She reported it as initially sharp and throbbing. She took two 81mg  of ASA and a SL nitroglycerin and does not feel like it helped with her pain any. She called 911 and was brought to the ER. She reports that now the pain feels a little better, but is now more of an ache. She denies any radiation of the pain. She reports that her SOB is at it's baseline. No recent cough or cold symptoms. No palpitations or leg swelling. Denies palpitations. She is not on any blood thinners. Reports compliancy with medications. She denies any lightheadedness, syncope, abdominal pain, nausea, vomiting. The patient reports that she was seen recently by her oncologist and had a CT scan done. Allergic to Codeine and Norvasc. Former smoker.    Chest Pain Associated symptoms: no abdominal pain, no cough, no fever, no nausea, no palpitations, no shortness of breath (at chronic state) and no vomiting        Home Medications Prior to Admission medications   Medication Sig Start Date End Date Taking? Authorizing Provider  acetaminophen (TYLENOL) 500 MG tablet Take 1 tablet (500 mg total) by mouth every 6 (six) hours. 05/22/23   Prosperi, Christian H, PA-C  albuterol (VENTOLIN HFA) 108 (90 Base) MCG/ACT inhaler TAKE 2 PUFFS BY MOUTH EVERY 6 HOURS AS NEEDED FOR WHEEZE OR SHORTNESS OF  BREATH 12/20/21   Heilingoetter, Cassandra L, PA-C  aspirin EC 81 MG tablet Take 81 mg by mouth at bedtime.    [provider]  atorvastatin (LIPITOR) 80 MG tablet Take 1 tablet (80 mg total) by mouth daily at 6 PM. 09/18/20   Corky Crafts, MD  Budeson-Glycopyrrol-Formoterol (BREZTRI AEROSPHERE) 160-9-4.8 MCG/ACT AERO Inhale 2 puffs into the lungs in the morning and at bedtime. 10/22/22   Leslye Peer, MD  cyclobenzaprine (FLEXERIL) 10 MG tablet Take 1 tablet (10 mg total) by mouth 3 (three) times daily as needed for muscle spasms. 07/22/22   [name], FNP  Dextromethorphan-guaiFENesin (DELSYM CGH/CHEST CONG DM CHILD) 5-100 MG/5ML LIQD Take 30 mLs by mouth every 6 (six) hours as needed (cough).    [provider]  estradiol (ESTRACE) 0.1 MG/GM vaginal cream Place 0.5g twice a week at opening of vagina 10/10/22   Marguerita Beards, MD  ezetimibe (ZETIA) 10 MG tablet Take 1 tablet (10 mg total) by mouth daily. 07/31/21   Chandrasekhar, [place] Dragon A, MD  ferrous sulfate 325 (65 FE) MG EC tablet TAKE 1 TABLET BY MOUTH EVERY DAY WITH BREAKFAST 06/19/23   Heilingoetter, Cassandra L, PA-C  hydrOXYzine (ATARAX) 10 MG tablet Take 1 tablet (10 mg total) by mouth 3 (three) times daily as needed. 11/26/21   [name], FNP  levothyroxine (SYNTHROID) 175 MCG tablet TAKE 175 MCG (1  TABLET) BY MOUTH DAILY MONDAY-FRIDAY 11/26/22   Heilingoetter, Cassandra L, PA-C  levothyroxine (SYNTHROID) 200 MCG tablet TAKE 1 TABLET EVERY OTHER DAY. ALTERNATE BETWEEN THE 175 MCG AND THE 200 MCG 09/25/23   Heilingoetter, Cassandra L, PA-C  lisinopril (ZESTRIL) 10 MG tablet TAKE 1 TABLET BY MOUTH EVERY DAY 03/31/23   [name], FNP  Methylcellulose, Laxative, (CITRUCEL PO) Take 2 tablets by mouth 2 (two) times daily. Patient not taking: Reported on 08/24/2023    [provider]  nitrofurantoin, macrocrystal-monohydrate, (MACROBID) 100 MG capsule Take 100 mg by mouth daily. Prescriber is Dr.  Sherron Monday, [facility]    [provider]  nitroGLYCERIN (NITROSTAT) 0.4 MG SL tablet PLACE 1 TABLET UNDER THE TONGUE EVERY 5 (FIVE) MINUTES X 3 DOSES AS NEEDED FOR CHEST PAIN. 07/28/23   [name], FNP  OXYGEN Inhale into the lungs.    [provider]  prochlorperazine (COMPAZINE) 10 MG tablet Take 1 tablet (10 mg total) by mouth every 6 (six) hours as needed for nausea or vomiting. 08/28/21   Heilingoetter, Cassandra L, PA-C  Trospium Chloride 60 MG CP24 TAKE 1 CAPSULE BY MOUTH EVERY DAY 09/29/23   Marguerita Beards, MD      Allergies    Codeine and Norvasc [amlodipine]    Review of Systems   Review of Systems  Constitutional:  Negative for chills and fever.  HENT:  Negative for congestion, rhinorrhea and sore throat.   Respiratory:  Negative for cough and shortness of breath (at chronic state).   Cardiovascular:  Positive for chest pain. Negative for palpitations and leg swelling.  Gastrointestinal:  Negative for abdominal pain, constipation, diarrhea, nausea and vomiting.  Neurological:  Negative for syncope and light-headedness.    Physical Exam Updated Vital Signs BP (!) 167/57   Pulse 64   Temp 98.2 F (36.8 C) (Oral)   Resp 18   Ht 5\' 6"  (1.676 m)   Wt 69.4 kg   SpO2 99%   BMI 24.69 kg/m  Physical Exam Vitals and nursing note reviewed.  Constitutional:      General: She is not in acute distress.    Appearance: She is not ill-appearing or toxic-appearing.  Cardiovascular:     Rate and Rhythm: Normal rate.     Pulses:          Radial pulses are 2+ on the right side and 2+ on the left side.       Dorsalis pedis pulses are 2+ on the right side and 2+ on the left side.       Posterior tibial pulses are 2+ on the right side and 2+ on the left side.  Pulmonary:     Effort: Pulmonary effort is normal. No tachypnea or respiratory distress.     Breath sounds: No decreased breath sounds.     Comments: Speaking in full sentences with ease, satting  well on her chronic 3L [place]. She does not appear to be in any distress. Does have some coarse wheezing at the bases, likely chronic for her.  Chest:       Comments: Point tenderness to the marked above area. No overlying skin changes or rash present. Has some SKs spots near, but not in the area of pain. No fluctuance, induration, erythema, or rash noted. No step off or deformities. No crepitus.  Abdominal:     Palpations: Abdomen is soft.     Tenderness: There is no abdominal tenderness.  Musculoskeletal:     Right lower leg:  No edema.     Left lower leg: No edema.  Skin:    General: Skin is warm and dry.  Neurological:     Mental Status: She is alert.     ED Results / Procedures / Treatments   Labs (all labs ordered are listed, but only abnormal results are displayed) Labs Reviewed  CBC WITH DIFFERENTIAL/PLATELET - Abnormal; Notable for the following components:      Result Value   RBC 3.61 (*)    Hemoglobin 10.8 (*)    HCT 35.1 (*)    Lymphs Abs 0.5 (*)    All other components within normal limits  COMPREHENSIVE METABOLIC PANEL - Abnormal; Notable for the following components:   Glucose, Bld 113 (*)    BUN 38 (*)    Creatinine, Ser 1.43 (*)    GFR, Estimated 36 (*)    All other components within normal limits  BRAIN NATRIURETIC PEPTIDE - Abnormal; Notable for the following components:   B Natriuretic Peptide 261.1 (*)    All other components within normal limits  TROPONIN I (HIGH SENSITIVITY)  TROPONIN I (HIGH SENSITIVITY)    EKG EKG Interpretation Date/Time:  Sunday October 11 2023 06:44:53 EST Ventricular Rate:  62 PR Interval:  156 QRS Duration:  155 QT Interval:  463 QTC Calculation: 471 R Axis:   73  Text Interpretation: Sinus rhythm Right bundle branch block Confirmed by Kristine Royal [phone]) on 10/11/2023 7:23:38 AM  Radiology DG Chest 2 View Result Date: 10/11/2023 CLINICAL DATA:  Chest pain on the left EXAM: CHEST - 2 VIEW COMPARISON:  05/22/2023.   Chest CT 09/29/2023 FINDINGS: Perihilar opacity and volume loss on the left. Persistent opacification at the left base where there is scarring and pleural fluid by CT. The right lung is relatively clear and stable. Normal heart size. IMPRESSION: Post treatment left chest with pleural fluid. No acute finding when compared to recent chest CT. Electronically Signed   By: Tiburcio Pea M.D.   On: 10/11/2023 08:10    Procedures Procedures   Medications Ordered in ED Medications - No data to display  ED Course/ Medical Decision Making/ A&P Clinical Course as of 10/11/23 0931  Sun Oct 11, 2023  0904 Patient declined CT Angio scan. She reports that she only one functioning kidney that has a kidney stone and she was told she could not have contrast [RR]  0928 [facility] Radiology to get her CT Chest scan that was done outpatient read as a STAT image [RR]  0930 Patient reports that her chest pain is improving [RR]    Clinical Course User Index [RR] Achille Rich, PA-C                               Medical Decision Making Amount and/or Complexity of Data Reviewed Labs: ordered. Radiology: ordered.   88 y.o. female presents to the ER for evaluation of chest pain. Differential diagnosis includes but is not limited to ACS, pericarditis, myocarditis, aortic dissection, PE, pneumothorax, esophageal rupture, pneumonia, reflux/PUD, biliary disease, pancreatitis, costochondritis, anxiety. Vital signs BP 167/57, otherwise unremarkable, satting well on her at home 3L . Physical exam as noted above.   I independently reviewed and interpreted the patient's labs. CBC shows anemia at 10.8. No leukocytosis. Anemia is at baseline. CMP shows Cr at baseline of 1.43. BUN at 38, glucose at 113. No other electrolyte or LFT abnormality. BNP mildly elevated at 261.1 however the  patient appears euvolemic. Troponin at 10 with repeat at 10.  On previous chart evaluation, the patient in March 2021 had a STEMI.  She had a drug-eluting stent placed and there was 0% residual stenosis.  Cath report at that time: 1.  Severe single-vessel coronary artery disease with a proximal RCA culprit lesion, treated successfully with primary PCI using a 2.75 x 24 mm Synergy DES 2.  Mild diffuse nonobstructive disease involving the LAD and left circumflex without any significant stenoses 3.  Normal LVEDP 4.  Moderately severe diffuse residual stenosis in the mid and distal RCA, favor initial medical therapy." Last cardiology visit was 08/05/2022.  Given patient's symptoms and history of cancer, it was decided to order a CTA. Patient's GFR >30. The patient declines this and reprots that she was told she could not have contrast given that "she only had one functioning kidney and it had a stone in it". I see that she has gotten CT contrast in February and June of 2024.   CXR Post treatment left chest with pleural fluid. No acute finding when compared to recent chest CT. Per radiologist's interpretation.    CT Chest from outside on 09/29/23 shows ***.   EKG reviewed and interpreted by my attending and read as Sinus rhythm Right bundle branch block.     After consideration of the diagnostic results and the patients response to treatment, I feel that *** .   Emergency department workup does*** not suggest an emergent condition requiring admission or immediate intervention beyond what has been performed at this time.   ***We discussed the results of the labs/imaging. The plan is ***. We discussed strict return precautions and red flag symptoms. The patient verbalized their understanding and agrees to the plan. The patient is stable and being discharged home in good condition.  ***Portions of this report may have been transcribed using voice recognition software. Every effort was made to ensure accuracy; however, inadvertent computerized transcription errors may be present.   I discussed this case with my attending physician  who cosigned this note including patient's presenting symptoms, physical exam, and planned diagnostics and interventions. Attending physician stated agreement with plan or made changes to plan which were implemented.   Attending physician assessed patient at bedside.  Final Clinical Impression(s) / ED Diagnoses Final diagnoses:  None    Rx / DC Orders ED Discharge Orders     None

## 2023-10-11 NOTE — Discharge Instructions (Addendum)
You were seen in the ER today for evaluation of your chest pain. Your workup does not show any acute findings. You have chosen to not be admitted. You can return to the ER at any time to continue your care. Please follow up with your cardiologist. If you have any concerns, new or worsening symptoms, please return to the ER for evaluation.   Contact a doctor if: Your chest pain does not go away. You feel depressed. You have a fever. Get help right away if: Your chest pain is worse. You have a cough that gets worse, or you cough up blood. You have very bad (severe) pain in your belly (abdomen). You pass out (faint). You have either of these for no clear reason: Sudden chest discomfort. Sudden discomfort in your arms, back, neck, or jaw. You have shortness of breath at any time. You suddenly start to sweat, or your skin gets clammy. You feel sick to your stomach (nauseous). You throw up (vomit). You suddenly feel lightheaded or dizzy. You feel very weak or tired. Your heart starts to beat fast, or it feels like it is skipping beats. These symptoms may be an emergency. Do not wait to see if the symptoms will go away. Get medical help right away. Call your local emergency services (911 in the [place] Do not drive yourself to the hospital.

## 2023-10-11 NOTE — Assessment & Plan Note (Signed)
Stable at baseline Continue to monitor  

## 2023-10-12 ENCOUNTER — Ambulatory Visit: Payer: PPO | Admitting: Obstetrics and Gynecology

## 2023-10-13 ENCOUNTER — Encounter: Payer: Self-pay | Admitting: Obstetrics and Gynecology

## 2023-10-13 ENCOUNTER — Other Ambulatory Visit (INDEPENDENT_AMBULATORY_CARE_PROVIDER_SITE_OTHER): Payer: PPO

## 2023-10-13 ENCOUNTER — Ambulatory Visit: Payer: PPO | Admitting: Obstetrics and Gynecology

## 2023-10-13 VITALS — BP 170/72 | HR 75

## 2023-10-13 DIAGNOSIS — N3281 Overactive bladder: Secondary | ICD-10-CM | POA: Diagnosis not present

## 2023-10-13 DIAGNOSIS — N952 Postmenopausal atrophic vaginitis: Secondary | ICD-10-CM

## 2023-10-13 DIAGNOSIS — N3001 Acute cystitis with hematuria: Secondary | ICD-10-CM | POA: Diagnosis not present

## 2023-10-13 LAB — POCT URINALYSIS DIP (CLINITEK)
Bilirubin, UA: NEGATIVE
Blood, UA: NEGATIVE
Glucose, UA: NEGATIVE mg/dL
Ketones, POC UA: NEGATIVE mg/dL
Leukocytes, UA: NEGATIVE
Nitrite, UA: NEGATIVE
POC PROTEIN,UA: NEGATIVE
Spec Grav, UA: 1.02 (ref 1.010–1.025)
Urobilinogen, UA: 0.2 U/dL
pH, UA: 5.5 (ref 5.0–8.0)

## 2023-10-13 MED ORDER — TROSPIUM CHLORIDE 20 MG PO TABS: 20.0000 mg | ORAL_TABLET | Freq: Two times a day (BID) | ORAL | 3 refills | Status: DC

## 2023-10-13 MED ORDER — ESTRADIOL 0.1 MG/GM VA CREA: TOPICAL_CREAM | VAGINAL | 11 refills | Status: DC

## 2023-10-13 NOTE — Patient Instructions (Addendum)
Use estrogen cream at opening of the vagina twice a week to help prevent urinary tract infections.   Start the trospium 20mg  twice a day in place of the 60mg  extended release tablet because this is less expensive.

## 2023-10-13 NOTE — Progress Notes (Deleted)
[facility] Return Visit  SUBJECTIVE  History of Present Illness: Abigail [name] is a 88 y.o. female seen in follow-up for ***. Plan at last visit was ***.     Past Medical History: Patient  has a past medical history of Anemia, Arthritis, Bacteremia (04/08/2012), CAP (community acquired pneumonia) (04/12/2012), Carotid artery occlusion, Chronic kidney disease, Complete uterine prolapse with prolapse of anterior vaginal wall, Complication of anesthesia, Constipation, Coronary artery disease, Diverticulitis (yrs ago coialitis), Dyspnea, History of blood transfusion, History of radiation therapy, Hypertension, Hypothyroid, Lung cancer (HCC), Numbness, Pneumonia, Pre-diabetes, Scoliosis, STEMI (ST elevation myocardial infarction) (HCC) (10/26/2019), Wears dentures, and Wears glasses.   Past Surgical History: She  has a past surgical history that includes Elbow surgery (1990's); Coronary/Graft Acute MI Revascularization (N/A, 10/26/2019); LEFT HEART CATH AND CORONARY ANGIOGRAPHY (N/A, 10/26/2019); Back surgery (1980 age 25); bartholin cyst removal (age 53's); Anterior and posterior repair with sacrospinous fixation (N/A, 12/20/2020); Cystocele repair (N/A, 12/20/2020); Bladder suspension (N/A, 12/20/2020); Cystoscopy (N/A, 12/20/2020); Tonsillectomy; Endobronchial ultrasound (N/A, 07/15/2021); Hemostasis control (07/15/2021); Bronchial brushings (07/15/2021); and Bronchial needle aspiration biopsy (07/15/2021).   Medications: She has a current medication list which includes the following prescription(s): acetaminophen, albuterol, aspirin ec, atorvastatin, breztri aerosphere, cyclobenzaprine, estradiol, ezetimibe, ferrous sulfate, hydroxyzine, levothyroxine, levothyroxine, lisinopril, nitrofurantoin (macrocrystal-monohydrate), nitroglycerin, oxygen-helium, prochlorperazine, and trospium chloride.   Allergies: Patient is allergic to codeine and norvasc [amlodipine].   Social  History: Patient  reports that she quit smoking about 37 years ago. Her smoking use included cigarettes. She started smoking about 72 years ago. She has a 52.5 pack-year smoking history. She has never used smokeless tobacco. She reports that she does not drink alcohol and does not use drugs.     OBJECTIVE     Physical Exam: There were no vitals filed for this visit. Gen: No apparent distress, A&O x 3.  Detailed Urogynecologic Evaluation:  Deferred. Prior exam showed:      No data to display             ASSESSMENT AND PLAN    Abigail [name] is a 88 y.o. with:  No diagnosis found.  There are no diagnoses linked to this encounter.   Marguerita Beards, MD

## 2023-10-13 NOTE — Progress Notes (Signed)
[facility] Return Visit  SUBJECTIVE  History of Present Illness: RONNICA DREESE is a 88 y.o. female seen in follow-up for overactive bladder.  Surgery: s/p anterior and posterior repair with perineorrhaphy, sacrospinous ligament fixation, midurethral sling and cystoscopy on 12/20/2020. She does not feel any symptoms of prolapse.   Has had several UTIs recently and is seeing Dr Lissa Hoard at [facility] for her staghorn calculi. She is now taking a low dose antibiotic (nitrofurantoin). She is not using vaginal estrogen cream (still has Rx). Denies any symptoms of prolapse. She does not have difficulty emptying her bladder.   Still taking the Trospium 60mg  ER . Voids every 2 hours during the day. Wakes once a night. Only drinks water. Rx is expensive and she pays $100/ month   Has completed chemotherapy and radiation treatments for lung cancer. Also completed immunotherapy. Stage IIIA (T2a, N2, M0) non-small cell lung cancer, adenocarcinoma diagnosed in November 2022. Currently chronically dependent on oxygen.   Past Medical History: Patient  has a past medical history of Anemia, Arthritis, Bacteremia (04/08/2012), CAP (community acquired pneumonia) (04/12/2012), Carotid artery occlusion, Chronic kidney disease, Complete uterine prolapse with prolapse of anterior vaginal wall, Complication of anesthesia, Constipation, Coronary artery disease, Diverticulitis (yrs ago coialitis), Dyspnea, History of blood transfusion, History of radiation therapy, Hypertension, Hypothyroid, Lung cancer (HCC), Numbness, Pneumonia, Pre-diabetes, Scoliosis, STEMI (ST elevation myocardial infarction) (HCC) (10/26/2019), Wears dentures, and Wears glasses.   Past Surgical History: She  has a past surgical history that includes Elbow surgery (1990's); Coronary/Graft Acute MI Revascularization (N/A, 10/26/2019); LEFT HEART CATH AND CORONARY ANGIOGRAPHY (N/A, 10/26/2019); Back surgery (1980 age 48); bartholin cyst  removal (age 81's); Anterior and posterior repair with sacrospinous fixation (N/A, 12/20/2020); Cystocele repair (N/A, 12/20/2020); Bladder suspension (N/A, 12/20/2020); Cystoscopy (N/A, 12/20/2020); Tonsillectomy; Endobronchial ultrasound (N/A, 07/15/2021); Hemostasis control (07/15/2021); Bronchial brushings (07/15/2021); and Bronchial needle aspiration biopsy (07/15/2021).   Medications: She has a current medication list which includes the following prescription(s): trospium, acetaminophen, albuterol, aspirin ec, atorvastatin, breztri aerosphere, cyclobenzaprine, estradiol, ezetimibe, ferrous sulfate, hydroxyzine, levothyroxine, levothyroxine, lisinopril, nitrofurantoin (macrocrystal-monohydrate), nitroglycerin, oxygen-helium, and prochlorperazine.   Allergies: Patient is allergic to codeine and norvasc [amlodipine].   Social History: Patient  reports that she quit smoking about 37 years ago. Her smoking use included cigarettes. She started smoking about 72 years ago. She has a 52.5 pack-year smoking history. She has never used smokeless tobacco. She reports that she does not drink alcohol and does not use drugs.      OBJECTIVE     Physical Exam: Vitals:   10/13/23 1137  BP: (!) 170/72  Pulse: 75     Gen: No apparent distress, A&O x 3.    ASSESSMENT AND PLAN    Ms. Kluck is a 88 y.o. with:  1. Overactive bladder     - For OAB, will change to Trospium 20mg  BID since this will be less expensive for her. We also discussed [facility] pharmacy for the ER formulation but she does not use computers. Also discussed trying Myrbetriq and trying to get it covered but she would rather stick with the trospium.  - Reviewed using Estrace cream twice weekly for UTI prevention. Refill provided. .  - Will continue to follow with [facility] for UTIs and kidney stone  Return 1 year or sooner if needed  Marguerita Beards, MD  Time spent: I spent 20 minutes dedicated to the care of this  patient on the date of this encounter to include pre-visit review  of records, face-to-face time with the patient and post visit documentation.

## 2023-10-15 LAB — URINE CULTURE: Organism ID, Bacteria: NO GROWTH

## 2023-10-21 DIAGNOSIS — J449 Chronic obstructive pulmonary disease, unspecified: Secondary | ICD-10-CM | POA: Diagnosis not present

## 2023-10-21 DIAGNOSIS — J9610 Chronic respiratory failure, unspecified whether with hypoxia or hypercapnia: Secondary | ICD-10-CM | POA: Diagnosis not present

## 2023-10-21 DIAGNOSIS — C3490 Malignant neoplasm of unspecified part of unspecified bronchus or lung: Secondary | ICD-10-CM | POA: Diagnosis not present

## 2023-10-22 ENCOUNTER — Ambulatory Visit: Payer: Self-pay

## 2023-10-22 NOTE — Patient Outreach (Signed)
 Care Coordination   Follow Up Visit Note   10/22/2023 Name: SHEWANDA SHARPE MRN: [id number] DOB: June 04, 1936  TAMULA MORRICAL is a 88 y.o. year old female who sees [name], FNP for primary care. I spoke with  [name] by phone today.  What matters to the patients health and wellness today?  Patient would like to follow up with Cardiology for evaluation of her chest pain.     Goals Addressed             This Visit's Progress    Schedule Cardiology appointment for evaluation of acute chest pain       Care Coordination Interventions:/ Evaluation of current treatment plan related to unspecified chest pain  and patient's adherence to plan as established by provider Discussed and reviewed patient's recent ED visit for c/o acute chest pain, she reports her symptoms have since resolved Review of patient status, including review of consultant's reports, relevant laboratory and other test results, and medications completed  Plan is follow up with cardiology, return to the ER for any new or worsening symptom  Provided patient with the contact number for [facility], she will call to schedule an appointment  Instructed patient to notify her doctor and or seek medical attention promptly for new symptoms or concerns and patient verbalizes understanding  Determined patient will keep her scheduled follow up with [name] FNP set for 12/22/23 @10 :20 AM Discussed plans with patient for ongoing care coordination follow up and provided patient with direct contact information for nurse care coordinator     Interventions Today    Flowsheet Row Most Recent Value  Chronic Disease   Chronic disease during today's visit Other  [overactive bladder,  acute onset of chest pain]  General Interventions   General Interventions Discussed/Reviewed General Interventions Discussed, General Interventions Reviewed, Doctor Visits  Doctor Visits Discussed/Reviewed Doctor Visits Discussed, Doctor Visits  Reviewed, Specialist  Education Interventions   Education Provided Provided Education  Provided Verbal Education On Exercise, Medication, When to see the doctor, Nutrition  Nutrition Interventions   Nutrition Discussed/Reviewed Nutrition Discussed, Nutrition Reviewed, Fluid intake  Pharmacy Interventions   Pharmacy Dicussed/Reviewed Pharmacy Topics Discussed, Pharmacy Topics Reviewed, Medications and their functions          SDOH assessments and interventions completed:  No     Care Coordination Interventions:  Yes, provided   Follow up plan: Follow up call scheduled for 11/05/23 @09 :30 AM    Encounter Outcome:  Patient Visit Completed

## 2023-10-22 NOTE — Patient Instructions (Signed)
 Visit Information  Thank you for taking time to visit with me today. Please don't hesitate to contact me if I can be of assistance to you.   Following are the goals we discussed today:   Goals Addressed             This Visit's Progress    Schedule Cardiology appointment for evaluation of acute chest pain       Care Coordination Interventions:/ Evaluation of current treatment plan related to unspecified chest pain  and patient's adherence to plan as established by provider Discussed and reviewed patient's recent ED visit for c/o acute chest pain, she reports her symptoms have since resolved Review of patient status, including review of consultant's reports, relevant laboratory and other test results, and medications completed  Plan is follow up with cardiology, return to the ER for any new or worsening symptom  Provided patient with the contact number for [facility], she will call to schedule an appointment  Instructed patient to notify her doctor and or seek medical attention promptly for new symptoms or concerns and patient verbalizes understanding  Determined patient will keep her scheduled follow up with [name] FNP set for 12/22/23 @10 :20 AM Discussed plans with patient for ongoing care coordination follow up and provided patient with direct contact information for nurse care coordinator           Our next appointment is by telephone on 11/05/23 at 09:30 AM  Please call the care guide team at [phone] if you need to cancel or reschedule your appointment.   If you are experiencing a Mental Health or Behavioral Health Crisis or need someone to talk to, please call 1-800-273-TALK (toll free, 24 hour hotline)  The patient verbalized understanding of instructions, educational materials, and care plan provided today and DECLINED offer to receive copy of patient instructions, educational materials, and care plan.   Delsa Sale RN BSN CCM [place]   [facility], [facility] Nurse Care Coordinator  Direct Dial: [phone] Website: [email or web address]

## 2023-10-28 ENCOUNTER — Encounter: Payer: Self-pay | Admitting: *Deleted

## 2023-10-28 ENCOUNTER — Ambulatory Visit: Payer: PPO | Attending: Cardiology | Admitting: Cardiology

## 2023-10-28 ENCOUNTER — Encounter: Payer: Self-pay | Admitting: Cardiology

## 2023-10-28 ENCOUNTER — Telehealth: Payer: Self-pay

## 2023-10-28 VITALS — BP 182/74 | HR 69 | Ht 66.0 in | Wt 155.6 lb

## 2023-10-28 DIAGNOSIS — R072 Precordial pain: Secondary | ICD-10-CM

## 2023-10-28 DIAGNOSIS — I25118 Atherosclerotic heart disease of native coronary artery with other forms of angina pectoris: Secondary | ICD-10-CM

## 2023-10-28 DIAGNOSIS — I10 Essential (primary) hypertension: Secondary | ICD-10-CM

## 2023-10-28 DIAGNOSIS — E039 Hypothyroidism, unspecified: Secondary | ICD-10-CM | POA: Diagnosis not present

## 2023-10-28 DIAGNOSIS — E782 Mixed hyperlipidemia: Secondary | ICD-10-CM | POA: Diagnosis not present

## 2023-10-28 MED ORDER — METOPROLOL SUCCINATE ER 50 MG PO TB24: 50.0000 mg | ORAL_TABLET | Freq: Every day | ORAL | 2 refills | Status: DC

## 2023-10-28 MED ORDER — ATORVASTATIN CALCIUM 80 MG PO TABS: 80.0000 mg | ORAL_TABLET | Freq: Every day | ORAL | 3 refills | Status: DC

## 2023-10-28 NOTE — Progress Notes (Signed)
 Cardiology Office Note:  .   Date:  10/28/2023  ID:  Abigail [name], DOB 08/01/1936, MRN [id number] PCP: [name], FNP  [facility] Providers Cardiologist:  Truett Mainland, MD PCP: [name], FNP  Chief Complaint  Patient presents with   Coronary Artery Disease      History of Present Illness: Abigail [name] Kitchen    Abigail [name] is a 88 y.o. female with hypertension, hyperlipidemia, CAD, carotid disease, h/o non-small cell lung cancer, adenocarcinoma s/p chemotherapy and immunotherapy, COPD on 3 L oxygen  Patient was seen recently in emergency room on 10/11/2023 with complaints of chest pain.  ACS, PE were excluded.  Pain was thought to be musculoskeletal.  Patient was discharged from the ED.   Patient is here today for follow-up visit.  She lives by herself, does most of her ADLs also with some help from son).  She is not feeling well today, feels tired and fatigued and just wants to sleep.  However, she denies any difficulty breathing or new chest pain.  Chest pain led to her ER visit.  Stabbing pain since resolved, but has had constant pain in her left breastbone ever since, worse with touch.    Her blood pressure is elevated today and has been elevated lately. She underwent routine testing with her PCP in 07/2023 that showed low TSH and elevated free T4.  It does not look like there was any dose adjustment made to her Synthroid, but time was changed.  She has not had any follow-up TSH, free T4 since then.  It appears that she is not taking atorvastatin.  Cholesterol is elevated.  Vitals:   10/28/23 1625  BP: (!) 182/74  Pulse: 69  SpO2: 97%     ROS:  Review of Systems  Constitutional: Positive for malaise/fatigue.  Cardiovascular:  Positive for chest pain. Negative for dyspnea on exertion, leg swelling, palpitations and syncope.     Studies Reviewed: Abigail [name] Kitchen         Independently interpreted 09/2023: HbA1C 5.8% Hb 10.8 Cr 1.43, eGFR 36 BNP 261  07/2023: Chol 234, TG  164, HDL 43, LDL 161 TSH 0.172, free T4 1.8  Coronary angiogram and intervention 10/2019: 1.  Severe single-vessel coronary artery disease with a proximal RCA culprit lesion, treated successfully with primary PCI using a 2.75 x 24 mm Synergy DES 2.  Mild diffuse nonobstructive disease involving the LAD and left circumflex without any significant stenoses 3.  Normal LVEDP 4.  Moderately severe diffuse residual stenosis in the mid and distal RCA, favor initial medical therapy.     Physical Exam:   Physical Exam Vitals and nursing note reviewed.  Constitutional:      General: She is not in acute distress. Neck:     Vascular: No JVD.  Cardiovascular:     Rate and Rhythm: Normal rate and regular rhythm.     Heart sounds: Normal heart sounds. No murmur heard. Pulmonary:     Effort: Pulmonary effort is normal.     Breath sounds: Wheezing present. No rales.     Comments: On 3 L oxygen Musculoskeletal:     Right lower leg: No edema.     Left lower leg: No edema.      VISIT DIAGNOSES:   ICD-10-CM   1. Coronary artery disease of native artery of native heart with stable angina pectoris (HCC)  I25.118 MYOCARDIAL PERFUSION IMAGING    Cardiac Stress Test: Informed Consent Details: Physician/Practitioner Attestation; Transcribe to consent form and obtain  patient signature    Lipid Profile    2. Precordial pain  R07.2 Cardiac Stress Test: Informed Consent Details: Physician/Practitioner Attestation; Transcribe to consent form and obtain patient signature    3. Mixed hyperlipidemia  E78.2 Lipid Profile    atorvastatin (LIPITOR) 80 MG tablet    Lipid Profile    4. Essential hypertension  I10 TSH    T4, free    metoprolol succinate (TOPROL-XL) 50 MG 24 hr tablet    Lipid Profile    T4, free    TSH    5. Acquired hypothyroidism  E03.9 TSH    T4, free    atorvastatin (LIPITOR) 80 MG tablet    Lipid Profile    T4, free    TSH       ASSESSMENT AND PLAN: .    Abigail [name] is a  88 y.o. female with hypertension, hyperlipidemia, CAD, carotid disease, h/o non-small cell lung cancer, adenocarcinoma s/p chemotherapy and immunotherapy, COPD on 3 L oxygen  CAD: Primary PCI for STEMI in 2021.   Recent episode of chest pain While her focal constant pain is likely musculoskeletal, she has known CAD with high risk factors. Recommend Lexiscan nuclear stress test. Continue aspirin 81 mg daily. Lipids uncontrolled, currently not taking atorvastatin or Zetia. Resume atorvastatin at 80 mg daily. Check lipid panel in 3 months.  Hypertension: Blood pressure is elevated.  She is currently only taking lisinopril 10 mg daily. eGFR 36 may limit dose uptitration. She does have low TSH and elevated free T4 when 07/2023.  She is on a very high dose of Synthroid 175 mcg.  I wonder if this dose is too high and causing her degree of hypothyroidism. I will check TSH and free T4. I will start her on metoprolol succinate 50 mg daily. She does have a lot of wheezing on exam, but this seems to be baseline with no worsening.  Metoprolol is beta-1 selective beta-blocker and should not make her breathing worse.   Informed Consent   Shared Decision Making/Informed Consent The risks [chest pain, shortness of breath, cardiac arrhythmias, dizziness, blood pressure fluctuations, myocardial infarction, stroke/transient ischemic attack, nausea, vomiting, allergic reaction, radiation exposure, metallic taste sensation and life-threatening complications (estimated to be 1 in 10,000)], benefits (risk stratification, diagnosing coronary artery disease, treatment guidance) and alternatives of a nuclear stress test were discussed in detail with Abigail [name] and she agrees to proceed.       Meds ordered this encounter  Medications   metoprolol succinate (TOPROL-XL) 50 MG 24 hr tablet    Sig: Take 1 tablet (50 mg total) by mouth daily. Take with or immediately following a meal.    Dispense:  90 tablet    Refill:   2   atorvastatin (LIPITOR) 80 MG tablet    Sig: Take 1 tablet (80 mg total) by mouth daily at 6 PM.    Dispense:  90 tablet    Refill:  3     I spent 40 minutes in the care of Abigail [name] today including reviewing labs (Trop, TSH, free T4, BMP from 07/2023 and 09/2023), reviewing studies (CT chest 09/2023), reviewing records from ER visit 09/2023 (Cath 2021), and documenting in the encounter.   F/u in 4-6 weeks  Signed, Elder Negus, MD

## 2023-10-28 NOTE — Telephone Encounter (Signed)
 Called patient to inform her she can not drop off urine sample without an appointment. Patient needs to reach out to her urologist she was seen by them 10/13/2023. Patient should reach out to urology regarding this issue.  2 VOICEMAILS LEFT FOR PT.

## 2023-10-28 NOTE — Patient Instructions (Addendum)
 Medication Instructions:   START TAKING METOPROLOL SUCCINATE (TOPROL XL) 50 MG BY MOUTH DAILY  START BACK TAKING YOUR LIPITOR 80 MG BY MOUTH DAILY  *If you need a refill on your cardiac medications before your next appointment, please call your pharmacy*   Lab Work:  1.)  SOMETIME THIS WEEK HERE IN THIS BUILDING--FIRST FLOOR AT [facility]--TSH AND FREE T4  2.)  IN 3 MONTHS AT ANY [facility]--LIPIDS--PLEASE COME FASTING TO THIS LAB APPOINTMENT  If you have labs (blood work) drawn today and your tests are completely normal, you will receive your results only by: MyChart Message (if you have MyChart) OR A paper copy in the mail If you have any lab test that is abnormal or we need to change your treatment, we will call you to review the results.   Testing/Procedures:  Your physician has requested that you have a lexiscan myoview. For further information please visit [email or web address] Please follow instruction sheet, as given.    Follow-Up:  4-6 WEEKS WITH DR. PATWARDHAN OR AN EXTENDER   Other Instructions  [facility] Contact for Alternative locations and appointment scheduling   [email or web address]   [email or web address]  [phone]

## 2023-10-29 ENCOUNTER — Other Ambulatory Visit: Payer: Self-pay | Admitting: Obstetrics and Gynecology

## 2023-10-29 ENCOUNTER — Telehealth: Payer: Self-pay

## 2023-10-29 DIAGNOSIS — N1330 Unspecified hydronephrosis: Secondary | ICD-10-CM | POA: Diagnosis not present

## 2023-10-29 DIAGNOSIS — N3281 Overactive bladder: Secondary | ICD-10-CM

## 2023-10-29 DIAGNOSIS — R310 Gross hematuria: Secondary | ICD-10-CM | POA: Diagnosis not present

## 2023-10-29 DIAGNOSIS — K449 Diaphragmatic hernia without obstruction or gangrene: Secondary | ICD-10-CM | POA: Diagnosis not present

## 2023-10-29 DIAGNOSIS — K5730 Diverticulosis of large intestine without perforation or abscess without bleeding: Secondary | ICD-10-CM | POA: Diagnosis not present

## 2023-10-29 DIAGNOSIS — N200 Calculus of kidney: Secondary | ICD-10-CM | POA: Diagnosis not present

## 2023-10-29 DIAGNOSIS — R8271 Bacteriuria: Secondary | ICD-10-CM | POA: Diagnosis not present

## 2023-10-29 NOTE — Telephone Encounter (Signed)
 Patient was called and informed to follow up with [facility] [phone] regarding thinking she had another UTI. Patient states she didn't want to reach out to them because she would have to wait. Patient was offered an appt here in office for many issues she stated she had - patient declined and stated she didn't have any transportation to go anywhere, patients phone then disconnected.

## 2023-11-02 ENCOUNTER — Telehealth (HOSPITAL_COMMUNITY): Payer: Self-pay | Admitting: *Deleted

## 2023-11-02 DIAGNOSIS — N130 Hydronephrosis with ureteropelvic junction obstruction: Secondary | ICD-10-CM | POA: Diagnosis not present

## 2023-11-02 DIAGNOSIS — N200 Calculus of kidney: Secondary | ICD-10-CM | POA: Diagnosis not present

## 2023-11-02 NOTE — Telephone Encounter (Signed)
 Patient given detailed instructions per Myocardial Perfusion Study Information Sheet for the test on 11/04/23 Patient notified to arrive 15 minutes early and that it is imperative to arrive on time for appointment to keep from having the test rescheduled.  If you need to cancel or reschedule your appointment, please call the office within 24 hours of your appointment. . Patient verbalized understanding. Ricky Ala

## 2023-11-04 ENCOUNTER — Ambulatory Visit (HOSPITAL_COMMUNITY): Attending: Cardiology

## 2023-11-04 DIAGNOSIS — I10 Essential (primary) hypertension: Secondary | ICD-10-CM | POA: Diagnosis not present

## 2023-11-04 DIAGNOSIS — I25118 Atherosclerotic heart disease of native coronary artery with other forms of angina pectoris: Secondary | ICD-10-CM | POA: Diagnosis not present

## 2023-11-04 DIAGNOSIS — E782 Mixed hyperlipidemia: Secondary | ICD-10-CM | POA: Diagnosis not present

## 2023-11-04 DIAGNOSIS — E039 Hypothyroidism, unspecified: Secondary | ICD-10-CM | POA: Diagnosis not present

## 2023-11-04 DIAGNOSIS — I2510 Atherosclerotic heart disease of native coronary artery without angina pectoris: Secondary | ICD-10-CM | POA: Diagnosis not present

## 2023-11-04 LAB — MYOCARDIAL PERFUSION IMAGING
LV dias vol: 103 mL (ref 46–106)
LV sys vol: 40 mL
Nuc Stress EF: 61 %
Peak HR: 81 {beats}/min
Rest HR: 66 {beats}/min
Rest Nuclear Isotope Dose: 10.4 mCi
SDS: 1
SRS: 7
SSS: 8
ST Depression (mm): 0 mm
Stress Nuclear Isotope Dose: 31.7 mCi
TID: 0.84

## 2023-11-04 MED ORDER — TECHNETIUM TC 99M TETROFOSMIN IV KIT
31.7000 | PACK | Freq: Once | INTRAVENOUS | Status: AC | PRN
  Administered 2023-11-04: 31.7 via INTRAVENOUS

## 2023-11-04 MED ORDER — REGADENOSON 0.4 MG/5ML IV SOLN
0.4000 mg | Freq: Once | INTRAVENOUS | Status: AC
  Administered 2023-11-04: 0.4 mg via INTRAVENOUS

## 2023-11-04 MED ORDER — TECHNETIUM TC 99M TETROFOSMIN IV KIT
10.4000 | PACK | Freq: Once | INTRAVENOUS | Status: AC | PRN
  Administered 2023-11-04: 10.4 via INTRAVENOUS

## 2023-11-05 ENCOUNTER — Ambulatory Visit: Payer: Self-pay

## 2023-11-05 LAB — LIPID PANEL
Chol/HDL Ratio: 4.6 ratio — ABNORMAL HIGH (ref 0.0–4.4)
Cholesterol, Total: 194 mg/dL (ref 100–199)
HDL: 42 mg/dL (ref >39)
LDL Chol Calc (NIH): 112 mg/dL — ABNORMAL HIGH (ref 0–99)
Triglycerides: 230 mg/dL — ABNORMAL HIGH (ref 0–149)
VLDL Cholesterol Cal: 40 mg/dL (ref 5–40)

## 2023-11-05 LAB — TSH: TSH: 0.263 u[IU]/mL — ABNORMAL LOW (ref 0.450–4.500)

## 2023-11-05 LAB — T4, FREE: Free T4: 1.58 ng/dL (ref 0.82–1.77)

## 2023-11-05 NOTE — Patient Outreach (Signed)
 Care Coordination   Follow Up Visit Note   11/05/2023 Name: Abigail [name] MRN: [id number] DOB: Jan 18, 1936  Abigail [name] is a 88 y.o. year old female who sees Abigail Felts, FNP for primary care. I spoke with  Abigail [name] by phone today.  What matters to the patients health and wellness today?  Patient would like to improve her thyroid levels. She would like to remain free from recurrent UTI's.     Goals Addressed             This Visit's Progress    Schedule Cardiology appointment for evaluation of acute chest pain   On track    Care Coordination Interventions:/ Evaluation of current treatment plan related to unspecified chest pain  and patient's adherence to plan as established by provider Determined patient completed her Cardiac follow up as directed due to recent ED visit for c/o chest pain  Review of patient status, including review of consultant's reports, relevant laboratory and other test results, and medications completed CAD: Primary PCI for STEMI in 2021.   Recent episode of chest pain While her focal constant pain is likely musculoskeletal, she has known CAD with high risk factors. Recommend Lexiscan nuclear stress test. Continue aspirin 81 mg daily. Lipids uncontrolled, currently not taking atorvastatin or Zetia. Resume atorvastatin at 80 mg daily. Check lipid panel in 3 months. Hypertension: Blood pressure is elevated.  She is currently only taking lisinopril 10 mg daily. eGFR 36 may limit dose uptitration. She does have low TSH and elevated free T4 when 07/2023.  She is on a very high dose of Synthroid 175 mcg.  I wonder if this dose is too high and causing her degree of hypothyroidism. I will check TSH and free T4. I will start her on metoprolol succinate 50 mg daily. She does have a lot of wheezing on exam, but this seems to be baseline with no worsening.  Metoprolol is beta-1 selective beta-blocker and should not make her breathing worse. Reviewed and discussed  with patient her TSH results came back abnormal (see new goal) Instructed patient to notify her doctor and or seek medical attention promptly for new symptoms or concerns and patient verbalizes understanding  Reviewed and discussed with patient her scheduled follow up with Cardiologist, Abigail [name] is scheduled for 12/03/23 @10 :00 AM Reviewed and discussed with patient her scheduled follow up with Abigail Felts FNP is scheduled for 12/22/23 @10 :20 AM Discussed plans with patient for ongoing care coordination follow up and provided patient with direct contact information for nurse care coordinator Scheduled nurse follow up call with patient for 12/24/23 @09 :30 AM        To have urinary frequency and hematuria futher evaluated   On track    Care Coordination Interventions: Evaluation of current treatment plan related to urinary frequency w/hematuria  and patient's adherence to plan as established by provider Determined patient completed a nurse visit with [facility] due to experiencing symptoms suggestive of UTI Review of patient status, including review of consultant's reports, relevant laboratory and other test results, and medications completed and discussed importance of medication adherence  Encouraged patient to seek medical attention promptly if symptoms persist or worsen and patient verbalizes understanding, she is able to repeat back signs/symptoms suggestive of UTI Reinforced the importance of increasing daily water intake to 48-64 oz daily unless otherwise directed Reviewed and discussed patient's next scheduled Urology follow up visit scheduled for 02/04/24 @1 :00 PM Discussed plans with patient for ongoing care coordination  follow up and provided patient with direct contact information for nurse care coordinator     To improve thryoid levels   On track    /Care Coordination Interventions:  @10 :20 AMEvaluation of current treatment plan related to hypothyroidism  and patient's  adherence to plan as established by provider Review of patient status, including review of consultant's reports, relevant laboratory and other test results, and medications completed Determined patient's TSH is abnormal, her free T4 is WNL (see levels below) Educated patient on how to properly take her medication for best effectiveness as follows; Take Synthroid once a day, every day at the same time before breakfast. Take Synthroid with only water and on an empty stomach. Wait 30 minutes to 1 hour before eating or drinking anything other than water. Determined patient verbalizes understanding and is able to repeat back the instructions given Reviewed medications with patient and discussed importance of medication adherence Collaborated with Abigail Felts FNP to advise of patient's most recent TSH result Reply received per Abigail Cram, FNP as follows;  Patient should hold her doses of 200 mcg on the weekend for two weeks, then take 175 mcg daily, we will recheck in April  Reviewed with patient, she is able to repeat back the instructions provided Reviewed and discussed patient's next scheduled PCP visit with Abigail Felts FNP is scheduled for 12/22/23 @10 :20 AM at which time Abigail [name] will recheck her TSH Component Ref Range & Units (hover) 7 d ago (11/04/23)        TSH 0.263 Low         Free T4 1.58                Interventions Today    Flowsheet Row Most Recent Value  Chronic Disease   Chronic disease during today's visit Other, Hypertension (HTN)  [UTI,  Acquired Hypothyroidism,  Precordial Chest Pain,  Hyperlipidemia]  General Interventions   General Interventions Discussed/Reviewed General Interventions Discussed, General Interventions Reviewed, Labs, Doctor Visits, Lipid Profile, Communication with  Doctor Visits Discussed/Reviewed Doctor Visits Discussed, Doctor Visits Reviewed, PCP, Specialist  Communication with PCP/Specialists  Abigail Felts FNP]  Education Interventions   Education  Provided Provided Education  Provided Verbal Education On Labs, When to see the doctor, Medication, Nutrition  Labs Reviewed Lipid Profile  Nutrition Interventions   Nutrition Discussed/Reviewed Nutrition Discussed, Nutrition Reviewed, Fluid intake  Pharmacy Interventions   Pharmacy Dicussed/Reviewed Pharmacy Topics Discussed, Pharmacy Topics Reviewed, Medications and their functions          SDOH assessments and interventions completed:  No     Care Coordination Interventions:  Yes, provided   Follow up plan: Follow up call scheduled for 12/24/23 @09 :30 AM    Encounter Outcome:  Patient Visit Completed

## 2023-11-05 NOTE — Patient Instructions (Signed)
 Visit Information  Thank you for taking time to visit with me today. Please don't hesitate to contact me if I can be of assistance to you.   Following are the goals we discussed today:   Goals Addressed             This Visit's Progress    Schedule Cardiology appointment for evaluation of acute chest pain   On track    Care Coordination Interventions:/ Evaluation of current treatment plan related to unspecified chest pain  and patient's adherence to plan as established by provider Determined patient completed her Cardiac follow up as directed due to recent ED visit for c/o chest pain  Review of patient status, including review of consultant's reports, relevant laboratory and other test results, and medications completed CAD: Primary PCI for STEMI in 2021.   Recent episode of chest pain While her focal constant pain is likely musculoskeletal, she has known CAD with high risk factors. Recommend Lexiscan nuclear stress test. Continue aspirin 81 mg daily. Lipids uncontrolled, currently not taking atorvastatin or Zetia. Resume atorvastatin at 80 mg daily. Check lipid panel in 3 months. Hypertension: Blood pressure is elevated.  She is currently only taking lisinopril 10 mg daily. eGFR 36 may limit dose uptitration. She does have low TSH and elevated free T4 when 07/2023.  She is on a very high dose of Synthroid 175 mcg.  I wonder if this dose is too high and causing her degree of hypothyroidism. I will check TSH and free T4. I will start her on metoprolol succinate 50 mg daily. She does have a lot of wheezing on exam, but this seems to be baseline with no worsening.  Metoprolol is beta-1 selective beta-blocker and should not make her breathing worse. Reviewed and discussed with patient her TSH results came back abnormal (see new goal) Instructed patient to notify her doctor and or seek medical attention promptly for new symptoms or concerns and patient verbalizes understanding  Reviewed  and discussed with patient her scheduled follow up with Cardiologist, Dr. Rosemary Holms is scheduled for 12/03/23 @10 :00 AM Reviewed and discussed with patient her scheduled follow up with [name] FNP is scheduled for 12/22/23 @10 :20 AM Discussed plans with patient for ongoing care coordination follow up and provided patient with direct contact information for nurse care coordinator Scheduled nurse follow up call with patient for 12/24/23 @09 :30 AM        To have urinary frequency and hematuria futher evaluated   On track    Care Coordination Interventions: Evaluation of current treatment plan related to urinary frequency w/hematuria  and patient's adherence to plan as established by provider Determined patient completed a nurse visit with [facility] due to experiencing symptoms suggestive of UTI Review of patient status, including review of consultant's reports, relevant laboratory and other test results, and medications completed and discussed importance of medication adherence  Encouraged patient to seek medical attention promptly if symptoms persist or worsen and patient verbalizes understanding, she is able to repeat back signs/symptoms suggestive of UTI Reinforced the importance of increasing daily water intake to 48-64 oz daily unless otherwise directed Reviewed and discussed patient's next scheduled Urology follow up visit scheduled for 02/04/24 @1 :00 PM Discussed plans with patient for ongoing care coordination follow up and provided patient with direct contact information for nurse care coordinator     To improve thryoid levels   On track    /Care Coordination Interventions:  @10 :20 AMEvaluation of current treatment plan related to hypothyroidism  and patient's adherence to plan as established by provider Review of patient status, including review of consultant's reports, relevant laboratory and other test results, and medications completed Determined patient's TSH is abnormal, her  free T4 is WNL (see levels below) Educated patient on how to properly take her medication for best effectiveness as follows; Take Synthroid once a day, every day at the same time before breakfast. Take Synthroid with only water and on an empty stomach. Wait 30 minutes to 1 hour before eating or drinking anything other than water. Determined patient verbalizes understanding and is able to repeat back the instructions given Reviewed medications with patient and discussed importance of medication adherence Collaborated with [name] FNP to advise of patient's most recent TSH result Reply received per Lolita Cram, FNP as follows;  Patient should hold her doses of 200 mcg on the weekend for two weeks, then take 175 mcg daily, we will recheck in April  Reviewed with patient, she is able to repeat back the instructions provided Reviewed and discussed patient's next scheduled PCP visit with [name] FNP is scheduled for 12/22/23 @10 :20 AM at which time Lolita Cram will recheck her TSH Component Ref Range & Units (hover) 7 d ago (11/04/23)        TSH 0.263 Low         Free T4 1.58                    Our next appointment is by telephone on 12/24/23 at 09:30 AM  Please call the care guide team at [phone] if you need to cancel or reschedule your appointment.   If you are experiencing a Mental Health or Behavioral Health Crisis or need someone to talk to, please call 1-800-273-TALK (toll free, 24 hour hotline)  The patient verbalized understanding of instructions, educational materials, and care plan provided today and DECLINED offer to receive copy of patient instructions, educational materials, and care plan.   Delsa Sale RN BSN CCM [place]  [facility], [facility] Nurse Care Coordinator  Direct Dial: [phone] Website: [email or web address]

## 2023-11-10 NOTE — Progress Notes (Signed)
 Lipid panel better than before, but needs to come down further.  Recommend compliance with statin.  Thyroid function improving.  Thanks MJP

## 2023-11-18 DIAGNOSIS — C3490 Malignant neoplasm of unspecified part of unspecified bronchus or lung: Secondary | ICD-10-CM | POA: Diagnosis not present

## 2023-11-18 DIAGNOSIS — J449 Chronic obstructive pulmonary disease, unspecified: Secondary | ICD-10-CM | POA: Diagnosis not present

## 2023-11-18 DIAGNOSIS — J9610 Chronic respiratory failure, unspecified whether with hypoxia or hypercapnia: Secondary | ICD-10-CM | POA: Diagnosis not present

## 2023-12-03 ENCOUNTER — Encounter: Payer: Self-pay | Admitting: Cardiology

## 2023-12-03 ENCOUNTER — Ambulatory Visit: Attending: Cardiology | Admitting: Cardiology

## 2023-12-03 ENCOUNTER — Other Ambulatory Visit: Payer: Self-pay | Admitting: Physician Assistant

## 2023-12-03 VITALS — BP 131/69 | HR 61 | Resp 16 | Ht 66.0 in | Wt 158.0 lb

## 2023-12-03 DIAGNOSIS — I10 Essential (primary) hypertension: Secondary | ICD-10-CM

## 2023-12-03 DIAGNOSIS — C3490 Malignant neoplasm of unspecified part of unspecified bronchus or lung: Secondary | ICD-10-CM

## 2023-12-03 DIAGNOSIS — I25118 Atherosclerotic heart disease of native coronary artery with other forms of angina pectoris: Secondary | ICD-10-CM | POA: Diagnosis not present

## 2023-12-03 DIAGNOSIS — E782 Mixed hyperlipidemia: Secondary | ICD-10-CM | POA: Diagnosis not present

## 2023-12-03 MED ORDER — METOPROLOL SUCCINATE ER 50 MG PO TB24: 50.0000 mg | ORAL_TABLET | Freq: Every day | ORAL | 2 refills | Status: DC

## 2023-12-03 NOTE — Patient Instructions (Signed)
 Medication Instructions:  Refilled Metoprolol   *If you need a refill on your cardiac medications before your next appointment, please call your pharmacy*  Lab Work: Lipid panel in 6 months   If you have labs (blood work) drawn today and your tests are completely normal, you will receive your results only by: MyChart Message (if you have MyChart) OR A paper copy in the mail If you have any lab test that is abnormal or we need to change your treatment, we will call you to review the results.  Referral to PHARM-D    Follow-Up: At [facility], you and your health needs are our priority.  As part of our continuing mission to provide you with exceptional heart care, our providers are all part of one team.  This team includes your primary Cardiologist (physician) and Advanced Practice Providers or APPs (Physician Assistants and Nurse Practitioners) who all work together to provide you with the care you need, when you need it.  Your next appointment:   6 month(s)  Provider:   Elder Negus, MD     We recommend signing up for the patient portal called "MyChart".  Sign up information is provided on this After Visit Summary.  MyChart is used to connect with patients for Virtual Visits (Telemedicine).  Patients are able to view lab/test results, encounter notes, upcoming appointments, etc.  Non-urgent messages can be sent to your provider as well.   To learn more about what you can do with MyChart, go to [email or web address].   Other Instructions       1st Floor: - Lobby - Registration  - Pharmacy  - Lab - Cafe  2nd Floor: - PV Lab - Diagnostic Testing (echo, CT, nuclear med)  3rd Floor: - Vacant  4th Floor: - TCTS (cardiothoracic surgery) - AFib Clinic - Structural Heart Clinic - Vascular Surgery  - Vascular Ultrasound  5th Floor: - [facility] Card[facility] (general and EP) - Clinical Pharmacy for coumadin, hypertension, lipid, weight-loss medications, and  med management appointments    Valet parking services will be available as well.

## 2023-12-03 NOTE — Progress Notes (Signed)
 Cardiology Office Note:  .   Date:  12/03/2023  ID:  [name], DOB 07-03-1936, MRN [id number] PCP: [name], FNP  [facility] Providers Cardiologist:  Truett Mainland, MD PCP: [name], FNP  Chief Complaint  Patient presents with   Coronary artery disease of native artery of native heart wi   Follow-up      History of Present Illness: [name] Kitchen    TERISA BELARDO is a 88 y.o. female with hypertension, hyperlipidemia, CAD, carotid disease, h/o non-small cell lung cancer, adenocarcinoma s/p chemotherapy and immunotherapy, COPD on 3 L oxygen  Patient denies any chest pain, exertional dyspnea stable with 3 L oxygen requirement.  Reviewed recent test results with the patient, details below.  Blood pressure is well-controlled.  Patient has been under a lot of stress with changes to her house.  Vitals:   12/03/23 0958  BP: 131/69  Pulse: 61  Resp: 16  SpO2: 90%      ROS:  Review of Systems  Constitutional: Positive for malaise/fatigue.  Cardiovascular:  Positive for chest pain. Negative for dyspnea on exertion, leg swelling, palpitations and syncope.     Studies Reviewed: [name] Kitchen        Independently interpreted 10/2023: Chol 194, TG 230, HDL 42, LDL 112 TSH 0.263, free T4 1.58  09/2023: HbA1C 5.8% Hb 10.8 Cr 1.43, eGFR 36 BNP 261  07/2023: Chol 234, TG 164, HDL 43, LDL 161 TSH 0.172, free T4 1.8  Stress test 10/2023:   LV perfusion is normal. There is no evidence of ischemia. There is no evidence of infarction.   Left ventricular function is normal. Nuclear stress EF: 61%. The left ventricular ejection fraction is normal (55-65%). End diastolic cavity size is normal.   The study is normal. The study is low risk.    Coronary angiogram and intervention 10/2019: 1.  Severe single-vessel coronary artery disease with a proximal RCA culprit lesion, treated successfully with primary PCI using a 2.75 x 24 mm Synergy DES 2.  Mild diffuse nonobstructive disease  involving the LAD and left circumflex without any significant stenoses 3.  Normal LVEDP 4.  Moderately severe diffuse residual stenosis in the mid and distal RCA, favor initial medical therapy.     Physical Exam:   Physical Exam Vitals and nursing note reviewed.  Constitutional:      General: She is not in acute distress. Neck:     Vascular: No JVD.  Cardiovascular:     Rate and Rhythm: Normal rate and regular rhythm.     Heart sounds: Normal heart sounds. No murmur heard. Pulmonary:     Effort: Pulmonary effort is normal.     Breath sounds: Wheezing present. No rales.     Comments: On 3 L oxygen Musculoskeletal:     Right lower leg: No edema.     Left lower leg: No edema.      VISIT DIAGNOSES:   ICD-10-CM   1. Coronary artery disease of native artery of native heart with stable angina pectoris (HCC)  I25.118 AMB Referral to [facility] Pharm-D    2. Primary hypertension  I10     3. Mixed hyperlipidemia  E78.2 AMB Referral to [facility] Pharm[facility]    Lipid panel    Lipid panel    4. Essential hypertension  I10 metoprolol succinate (TOPROL-XL) 50 MG 24 hr tablet        ASSESSMENT AND PLAN: .    ALAIAH LUNDY is a 88 y.o. female with hypertension, hyperlipidemia,  CAD, carotid disease, h/o non-small cell lung cancer, adenocarcinoma s/p chemotherapy and immunotherapy, COPD on 3 L oxygen  CAD: Primary PCI for STEMI in 2021.   No ischemia on stress testing 10/2023. Continue aspirin 81 mg daily. Lipids remain uncontrolled on Lipitor 80 mg daily and Zetia 10 mg daily. Given her history of MI, recommend NG tube patient's.  She is afraid to do injections by herself at home.  I think she will benefit from Leqvio injections at infusion center.    Hypertension: Now well-controlled on lisinopril 10 mg daily and metoprolol succinate 50 mg daily. Levothyroxine dose was recently reduced, that also likely helped.  F/u in 6 months  Signed, Elder Negus, MD

## 2023-12-04 IMAGING — CT CT CHEST W/O CM
2 of 4 series · 14 of 36 positions shown, 17 images · non-contrast
Comparison: Most recent CT chest 10/10/2021. 07/09/2021 PET-CT.

CLINICAL DATA: Primary Cancer Type: Lung
TECHNIQUE: Multidetector CT imaging of the chest was performed following the
standard protocol without IV contrast.

RADIATION DOSE REDUCTION: This exam was performed according to the
departmental dose-optimization program which includes automated
exposure control, adjustment of the mA and/or kV according to
patient size and/or use of iterative reconstruction technique.

[Series 2: thorax · axial · 0.79mm/px · z∈[-179,+45]mm · 11 of 134 slices shown, 14 images]
[im 11/134  mediastinal]
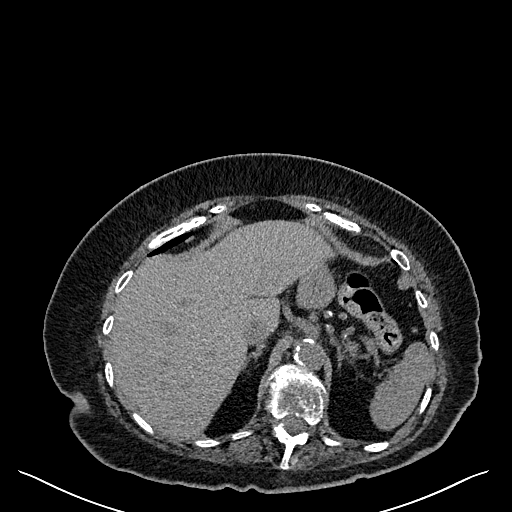
[im 11/134  lung]
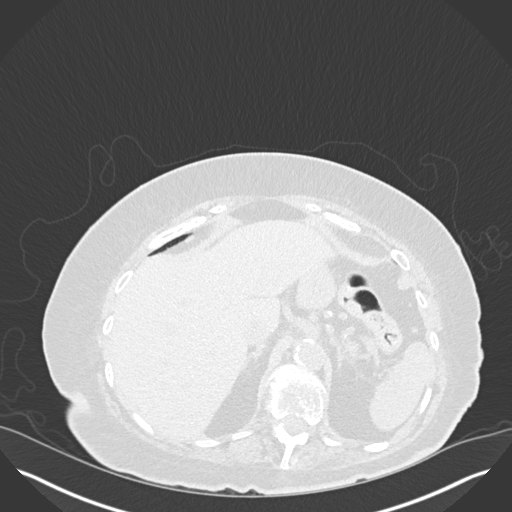
[im 21/134  lung]
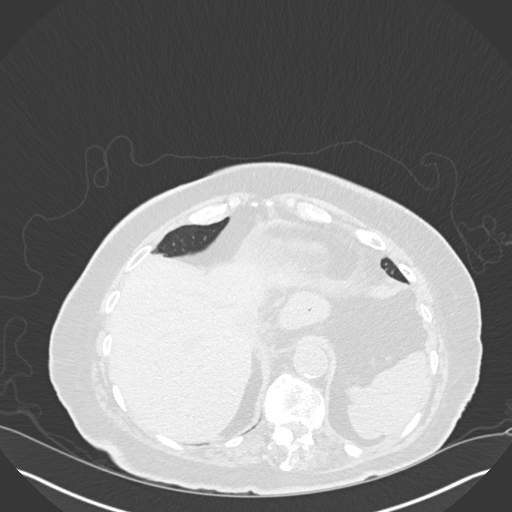
[im 31/134  lung]
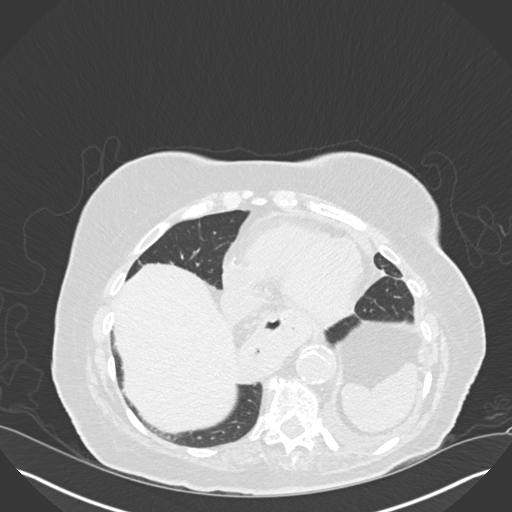
[im 41/134  lung]
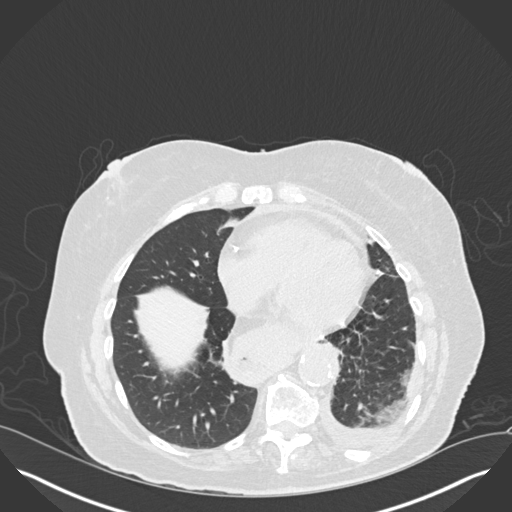
[im 52/134  mediastinal]
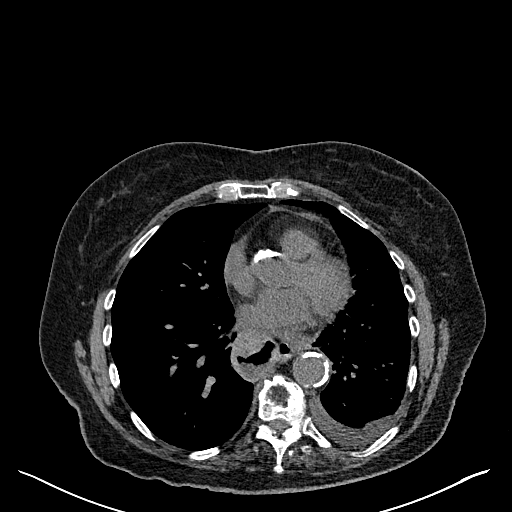
[im 52/134  lung]
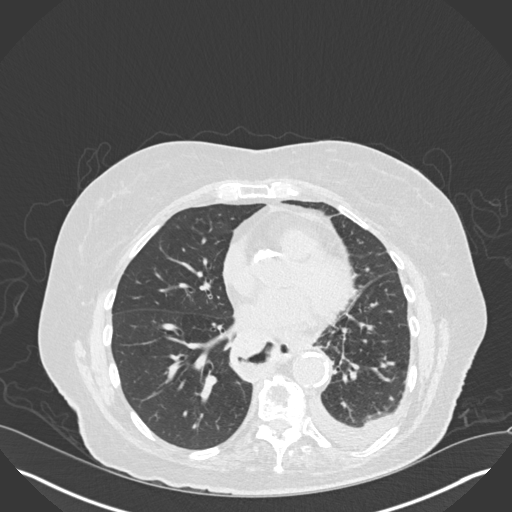
[im 72/134  lung]
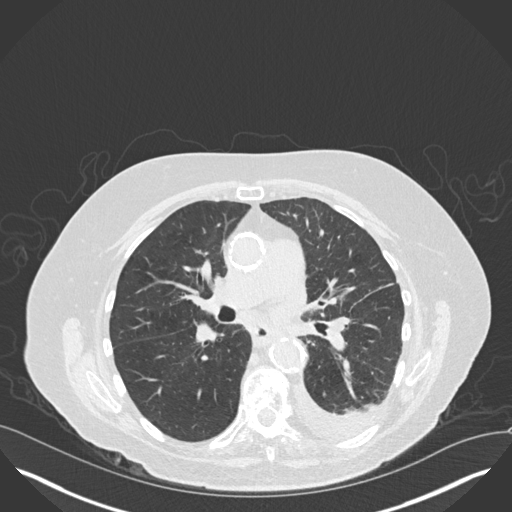
[im 82/134  lung]
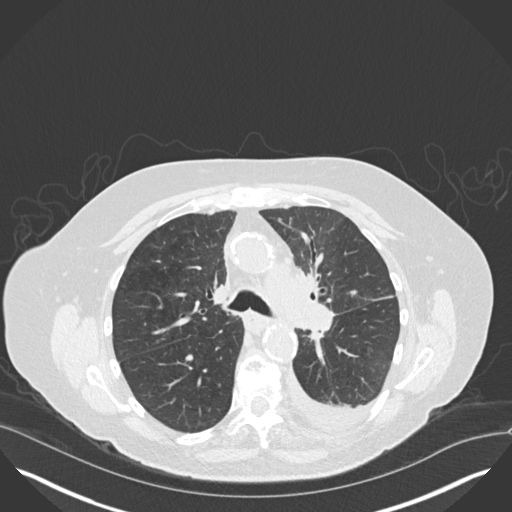
[im 93/134  lung]
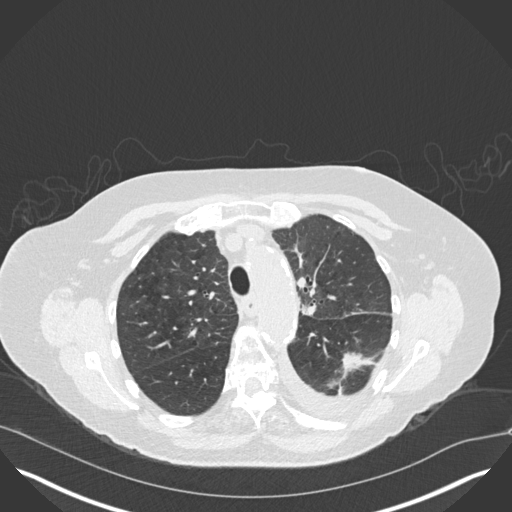
[im 103/134  mediastinal]
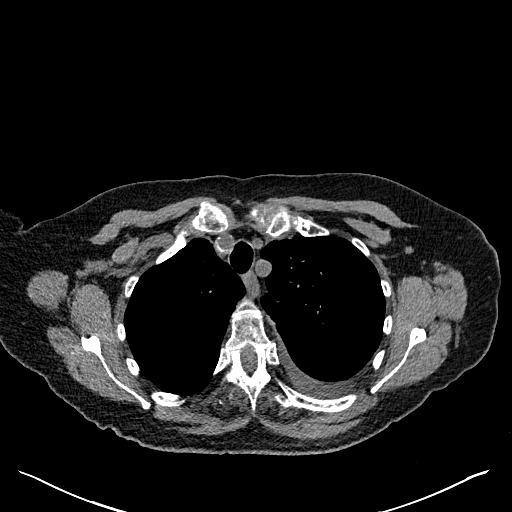
[im 103/134  lung]
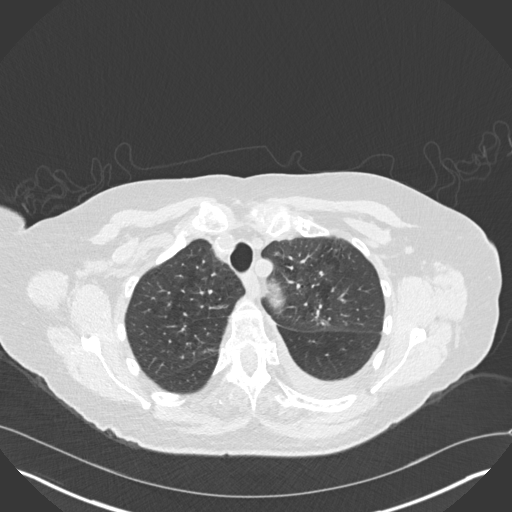
[im 113/134  lung]
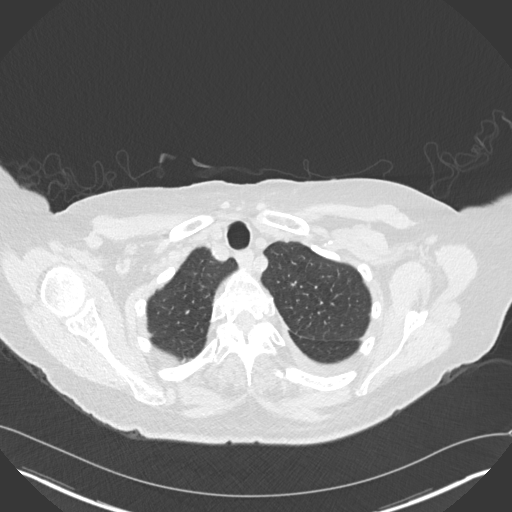
[im 123/134  lung]
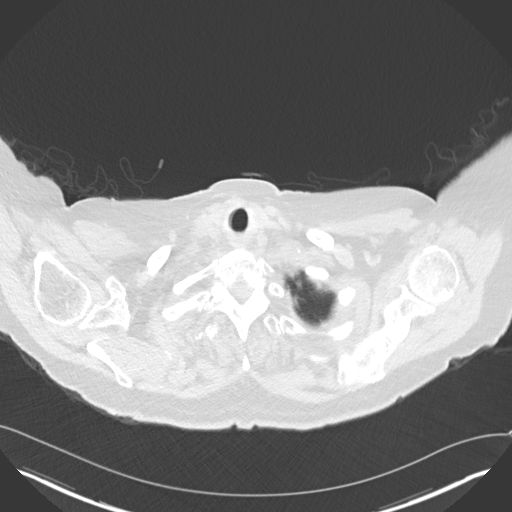

[Series 6: coronal · coronal · 0.56mm/px · 3 of 150 slices shown]
[im 30/150  lung]
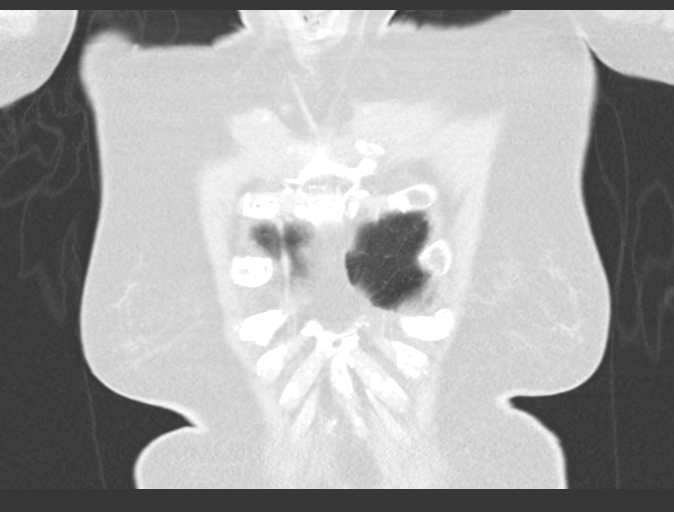
[im 60/150  lung]
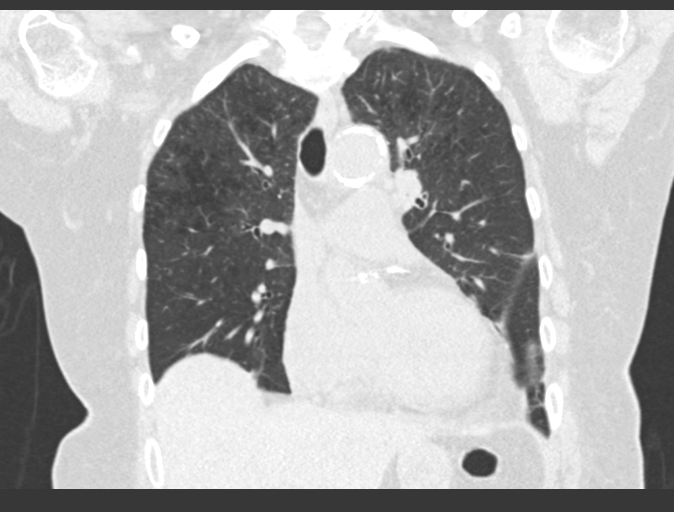
[im 90/150  lung]
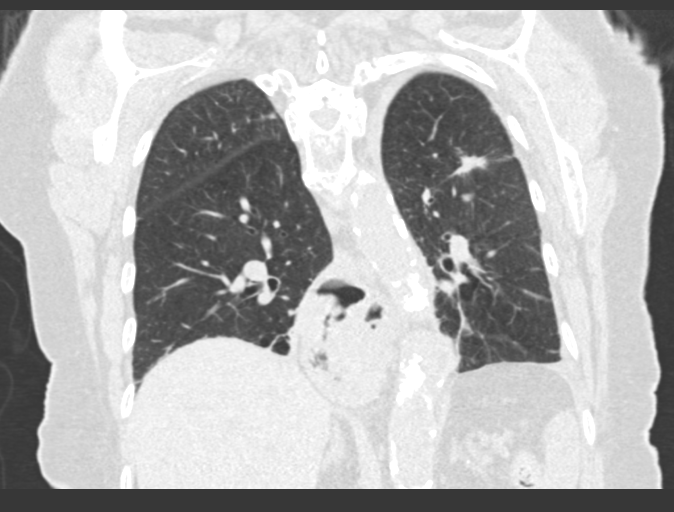

[14 of 36 positions shown; findings below may reference images not displayed]

Imaging Indication: Assess response to therapy

Interval therapy since last imaging? Yes

Initial Cancer Diagnosis

Date: 07/15/2021; Established by: Biopsy-proven

Detailed Pathology: Stage IIIA non-small cell lung cancer,
adenocarcinoma.

Primary Tumor location:  Left lower lobe.

Surgeries: Coronary graft 7773.

Chemotherapy: Yes; Ongoing? No; Most recent administration:
09/03/2021

Immunotherapy?  Yes; Type: Imfinzi; Ongoing? Yes

Radiation therapy? Yes; Date Range: 08/06/2021 - 09/18/2021; Target:
Left lung

* Tracking Code: BO *

EXAM:
CT CHEST WITHOUT CONTRAST
FINDINGS: Cardiovascular: Calcified atheromatous plaque in the thoracic aorta
with similar appearance. Heart size is stable. Small pericardial
effusion is new from recent comparison imaging. Central pulmonary
vasculature is of normal caliber.

Mediastinum/Nodes: No adenopathy in the chest. Segmental esophageal
thickening similar to previous imaging.

Lungs/Pleura: Small LEFT pleural effusion is new.

Reduced size of LEFT lower lobe pulmonary nodule (image 43/5) 1.7 x
1.2 cm previously 2.5 x 2.1 cm.

Mild thickening along the major fissure in the LEFT upper lobe and
some ground-glass and mild septal thickening associated with this
finding. No new discrete area of nodularity.

Mild pulmonary emphysema.

Major airways are patent.

Upper Abdomen: Imaged portions the liver, pancreas, spleen, adrenal
glands and kidneys are unremarkable.

Musculoskeletal: No acute bone finding. No destructive bone process.
Spinal degenerative changes.
IMPRESSION: Continued decrease in size of the LEFT lower lobe pulmonary nodule.

Ground-glass, septal thickening and mild thickening of the fissure
in the LEFT chest in the LEFT upper lobe, favored to be related to
post treatment changes.

Small LEFT-sided effusion and very small pericardial effusion both
new compared to recent imaging, potentially reactive. Suggest
attention on follow-up.

Signs of esophagitis with moderately large hiatal hernia as before.

Aortic Atherosclerosis (Z3HFU-JEQ.Q) and Emphysema (Z3HFU-1OO.R).

## 2023-12-07 ENCOUNTER — Other Ambulatory Visit: Payer: Self-pay | Admitting: Emergency Medicine

## 2023-12-18 DIAGNOSIS — N3000 Acute cystitis without hematuria: Secondary | ICD-10-CM | POA: Diagnosis not present

## 2023-12-18 DIAGNOSIS — R300 Dysuria: Secondary | ICD-10-CM | POA: Diagnosis not present

## 2023-12-18 DIAGNOSIS — I10 Essential (primary) hypertension: Secondary | ICD-10-CM | POA: Diagnosis not present

## 2023-12-18 DIAGNOSIS — R350 Frequency of micturition: Secondary | ICD-10-CM | POA: Diagnosis not present

## 2023-12-19 DIAGNOSIS — C3490 Malignant neoplasm of unspecified part of unspecified bronchus or lung: Secondary | ICD-10-CM | POA: Diagnosis not present

## 2023-12-19 DIAGNOSIS — J449 Chronic obstructive pulmonary disease, unspecified: Secondary | ICD-10-CM | POA: Diagnosis not present

## 2023-12-19 DIAGNOSIS — J9610 Chronic respiratory failure, unspecified whether with hypoxia or hypercapnia: Secondary | ICD-10-CM | POA: Diagnosis not present

## 2023-12-22 ENCOUNTER — Encounter: Payer: Self-pay | Admitting: Nurse Practitioner

## 2023-12-22 ENCOUNTER — Ambulatory Visit (INDEPENDENT_AMBULATORY_CARE_PROVIDER_SITE_OTHER): Payer: PPO | Admitting: Nurse Practitioner

## 2023-12-22 VITALS — BP 120/70 | HR 62 | Temp 97.9°F | Ht 66.0 in | Wt 159.6 lb

## 2023-12-22 DIAGNOSIS — R7303 Prediabetes: Secondary | ICD-10-CM | POA: Diagnosis not present

## 2023-12-22 DIAGNOSIS — Z2821 Immunization not carried out because of patient refusal: Secondary | ICD-10-CM | POA: Diagnosis not present

## 2023-12-22 DIAGNOSIS — N1832 Chronic kidney disease, stage 3b: Secondary | ICD-10-CM

## 2023-12-22 DIAGNOSIS — N390 Urinary tract infection, site not specified: Secondary | ICD-10-CM

## 2023-12-22 DIAGNOSIS — I129 Hypertensive chronic kidney disease with stage 1 through stage 4 chronic kidney disease, or unspecified chronic kidney disease: Secondary | ICD-10-CM

## 2023-12-22 DIAGNOSIS — I5033 Acute on chronic diastolic (congestive) heart failure: Secondary | ICD-10-CM

## 2023-12-22 DIAGNOSIS — E039 Hypothyroidism, unspecified: Secondary | ICD-10-CM | POA: Diagnosis not present

## 2023-12-22 DIAGNOSIS — I130 Hypertensive heart and chronic kidney disease with heart failure and stage 1 through stage 4 chronic kidney disease, or unspecified chronic kidney disease: Secondary | ICD-10-CM | POA: Diagnosis not present

## 2023-12-22 NOTE — Progress Notes (Signed)
 [place], CMA,acting as a Neurosurgeon for Abigail Epley, FNP.,have documented all relevant documentation on the behalf of Abigail Epley, FNP,as directed by  Abigail Epley, FNP while in the presence of Abigail Epley, FNP.  Subjective:  Patient ID: Abigail [name] , female    DOB: 08-30-1935 , 88 y.o.   MRN: [id number]  Chief Complaint  Patient presents with   Hypertension    Patient presents today for a bp and pre dm follow up, Patient reports compliance with medication. Patient denies any chest pain, SOB, or headaches. Patient has no concerns today.    Urinary Tract Infection    Patient reports she is on a antibiotic cefadroxil  for a UTI she got Friday, she is on the last day she would like her urine checked today. She hasn't seen her urology in about 2 months.     HPI  She is unsure if they sent a urine culture.      Past Medical History:  Diagnosis Date   Anemia    Arthritis    knees, back   Bacteremia 04/08/2012   CAP (community acquired pneumonia) 04/12/2012   Carotid artery occlusion    Chronic kidney disease    stage 3 ckd no nephrologist   Complete uterine prolapse with prolapse of anterior vaginal wall    Complication of anesthesia    hard to wake up per pt   Constipation    Coronary artery disease    Diverticulitis yrs ago coialitis   Dyspnea    History of blood transfusion    History of radiation therapy    right lung 08/05/2021-09/18/2021  Dr Retta Caster   Hypertension    Hypothyroid    LLQ abdominal pain 04/05/2012   Lung cancer (HCC)    Numbness    in hands at times   Pneumonia    Pre-diabetes    Scoliosis    STEMI (ST elevation myocardial infarction) (HCC) 10/26/2019   DES RCA   Wears dentures    full dentures   Wears glasses    for reading     Family History  Problem Relation Age of Onset   Hypertension Mother    Colon cancer Neg Hx    Stomach cancer Neg Hx    Esophageal cancer Neg Hx      Current Outpatient Medications:    albuterol   (VENTOLIN  HFA) 108 (90 Base) MCG/ACT inhaler, TAKE 2 PUFFS BY MOUTH EVERY 6 HOURS AS NEEDED FOR WHEEZE OR SHORTNESS OF BREATH, Disp: 8.5 each, Rfl: 1   aspirin  EC 81 MG tablet, Take 81 mg by mouth at bedtime., Disp: , Rfl:    atorvastatin  (LIPITOR ) 80 MG tablet, Take 1 tablet (80 mg total) by mouth daily at 6 PM., Disp: 90 tablet, Rfl: 3   BREZTRI  AEROSPHERE 160-9-4.8 MCG/ACT AERO inhaler, INHALE 2 PUFFS INTO THE LUNGS IN THE MORNING AND ALSO 2 PUFFS AT BEDTIME, Disp: 10.7 each, Rfl: 6   estradiol  (ESTRACE ) 0.1 MG/GM vaginal cream, Place 0.5g twice a week at opening of vagina (Patient not taking: Reported on 12/24/2023), Disp: 42.5 g, Rfl: 11   ezetimibe  (ZETIA ) 10 MG tablet, Take 1 tablet (10 mg total) by mouth daily., Disp: 90 tablet, Rfl: 3   ferrous sulfate  325 (65 FE) MG EC tablet, TAKE 1 TABLET BY MOUTH EVERY DAY WITH BREAKFAST, Disp: 30 tablet, Rfl: 2   levothyroxine  (SYNTHROID ) 175 MCG tablet, TAKE 1 TABLET BY MOUTH EVERY MORNING ON MONDAY - FRIDAY (Patient taking differently: 11/05/23 TAKE  1 TABLET BY MOUTH EVERY MORNING MONDAY - SUNDAY STARTING ON 11/16/23 per Abigail Epley FNP), Disp: 90 tablet, Rfl: 1   lisinopril  (ZESTRIL ) 10 MG tablet, TAKE 1 TABLET BY MOUTH EVERY DAY, Disp: 90 tablet, Rfl: 1   metoprolol  succinate (TOPROL -XL) 50 MG 24 hr tablet, Take 1 tablet (50 mg total) by mouth daily. Take with or immediately following a meal., Disp: 90 tablet, Rfl: 2   nitrofurantoin  (MACRODANTIN ) 100 MG capsule, Take 100 mg by mouth daily., Disp: , Rfl:    nitrofurantoin , macrocrystal-monohydrate, (MACROBID ) 100 MG capsule, Take 100 mg by mouth daily. Prescriber is Dr. Clarke Crouch, [facility], Disp: , Rfl:    nitroGLYCERIN  (NITROSTAT ) 0.4 MG SL tablet, PLACE 1 TABLET UNDER THE TONGUE EVERY 5 (FIVE) MINUTES X 3 DOSES AS NEEDED FOR CHEST PAIN., Disp: 25 tablet, Rfl: 3   OXYGEN , Inhale 3 L into the lungs continuous., Disp: , Rfl:    trospium  (SANCTURA ) 20 MG tablet, Take 1 tablet (20 mg total) by  mouth 2 (two) times daily., Disp: 180 tablet, Rfl: 3   ciprofloxacin  (CIPRO ) 250 MG tablet, Take 250 mg by mouth 2 (two) times daily., Disp: , Rfl:    levothyroxine  (SYNTHROID ) 200 MCG tablet, TAKE 1 TABLET EVERY OTHER DAY. ALTERNATE BETWEEN THE 175 MCG AND THE 200 MCG (Patient not taking: No sig reported), Disp: 30 tablet, Rfl: 1   Allergies  Allergen Reactions   Codeine Nausea And Vomiting   Norvasc  [Amlodipine ] Rash and Other (See Comments)    rash     Review of Systems  Constitutional: Negative.   Respiratory: Negative.    Cardiovascular: Negative.   Neurological: Negative.   Psychiatric/Behavioral: Negative.       Today's Vitals   12/22/23 1028 12/22/23 1110  BP: (!) 140/80 120/70  Pulse: 62   Temp: 97.9 F (36.6 C)   TempSrc: Oral   Weight: 159 lb 9.6 oz (72.4 kg)   Height: 5\' 6"  (1.676 m)   PainSc: 0-No pain    Body mass index is 25.76 kg/m.  Wt Readings from Last 3 Encounters:  12/22/23 159 lb 9.6 oz (72.4 kg)  12/03/23 158 lb (71.7 kg)  10/28/23 155 lb 9.6 oz (70.6 kg)     Objective:  Physical Exam Vitals reviewed.  Constitutional:      General: She is not in acute distress.    Appearance: Normal appearance.  Cardiovascular:     Rate and Rhythm: Normal rate and regular rhythm.     Pulses: Normal pulses.     Heart sounds: Normal heart sounds. No murmur heard. Pulmonary:     Effort: Pulmonary effort is normal. No respiratory distress.     Breath sounds: Normal breath sounds. No wheezing.     Comments: Wearing supplemental oxygen  Musculoskeletal:        General: No swelling or tenderness.     Right wrist: No tenderness. Normal range of motion.     Left wrist: No tenderness. Normal range of motion.     Comments: Uses rolling walker  Skin:    General: Skin is warm and dry.  Neurological:     General: No focal deficit present.     Mental Status: She is alert and oriented to person, place, and time.     Cranial Nerves: No cranial nerve deficit.      Motor: No weakness.  Psychiatric:        Mood and Affect: Mood normal.        Behavior: Behavior normal.  Thought Content: Thought content normal.        Judgment: Judgment normal.     Assessment And Plan:  Prediabetes Assessment & Plan: Hemoglobin A1c is stable.  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  Continue follow-up with cardiology.  And continue current medications.  Orders: -     BMP8+eGFR  Urinary tract infection without hematuria, site unspecified Assessment & Plan: She is currently completing a round of antibiotics I have advised her to return a week after completing with a urine sample to check to see if she is cleared.  She did get this medication from an urgent care.  Orders: -     Urine Culture; Future  Acquired hypothyroidism Assessment & Plan: Will check thyroid  levels.  Orders: -     TSH + free T4  COVID-19 vaccination declined Assessment & Plan: Declines covid 19 vaccine. Discussed risk of covid 63 and if she changes her mind about the vaccine to call the office. Education has been provided regarding the importance of this vaccine but patient still declined. Advised may receive this vaccine at local pharmacy or Health Dept.or vaccine clinic. Aware to provide a copy of the vaccination record if obtained from local pharmacy or Health Dept.  Encouraged to take multivitamin, vitamin d , vitamin c and zinc to increase immune system. Aware can call office if would like to have vaccine here at office. Verbalized acceptance and understanding.      Return for keep same next..  Patient was given opportunity to ask questions. Patient verbalized understanding of the plan and was able to repeat key elements of the plan. All questions were answered  to their satisfaction.    Inge Mangle, FNP, have reviewed all documentation for this visit. The documentation on 12/22/23 for the exam, diagnosis, procedures, and orders are all accurate and complete.   IF YOU HAVE BEEN REFERRED TO A SPECIALIST, IT MAY TAKE 1-2 WEEKS TO SCHEDULE/PROCESS THE REFERRAL. IF YOU HAVE NOT HEARD FROM US /SPECIALIST IN TWO WEEKS, PLEASE GIVE US  A CALL AT [phone].

## 2023-12-23 ENCOUNTER — Encounter: Payer: Self-pay | Admitting: Internal Medicine

## 2023-12-23 LAB — HEMOGLOBIN A1C
Est. average glucose Bld gHb Est-mCnc: 134 mg/dL
Hgb A1c MFr Bld: 6.3 % — ABNORMAL HIGH (ref 4.8–5.6)

## 2023-12-23 LAB — TSH+FREE T4
Free T4: 1.77 ng/dL (ref 0.82–1.77)
TSH: 0.505 u[IU]/mL (ref 0.450–4.500)

## 2023-12-23 LAB — BMP8+EGFR
BUN/Creatinine Ratio: 27 (ref 12–28)
BUN: 36 mg/dL — ABNORMAL HIGH (ref 8–27)
CO2: 22 mmol/L (ref 20–29)
Calcium: 9.4 mg/dL (ref 8.7–10.3)
Chloride: 99 mmol/L (ref 96–106)
Creatinine, Ser: 1.32 mg/dL — ABNORMAL HIGH (ref 0.57–1.00)
Glucose: 111 mg/dL — ABNORMAL HIGH (ref 70–99)
Potassium: 5 mmol/L (ref 3.5–5.2)
Sodium: 138 mmol/L (ref 134–144)
eGFR: 40 mL/min/{1.73_m2} — ABNORMAL LOW (ref >59)

## 2023-12-24 ENCOUNTER — Ambulatory Visit: Payer: Self-pay

## 2023-12-24 NOTE — Patient Outreach (Signed)
 Complex Care Management   Visit Note  12/24/2023  Name:  Abigail [name] MRN: [id number] DOB: [birth year]  Situation: Referral received for Complex Care Management related to Chronic Kidney Disease stage 3b with recurrent UTI, CAD, Primary Hypertension. I obtained verbal consent from Patient.  Visit completed with patient on the phone.  Background:   Past Medical History:  Diagnosis Date   Anemia    Arthritis    knees, back   Bacteremia 04/08/2012   CAP (community acquired pneumonia) 04/12/2012   Carotid artery occlusion    Chronic kidney disease    stage 3 ckd no nephrologist   Complete uterine prolapse with prolapse of anterior vaginal wall    Complication of anesthesia    hard to wake up per pt   Constipation    Coronary artery disease    Diverticulitis yrs ago coialitis   Dyspnea    History of blood transfusion    History of radiation therapy    right lung 08/05/2021-09/18/2021  Dr Retta Caster   Hypertension    Hypothyroid    Lung cancer (HCC)    Numbness    in hands at times   Pneumonia    Pre-diabetes    Scoliosis    STEMI (ST elevation myocardial infarction) (HCC) 10/26/2019   DES RCA   Wears dentures    full dentures   Wears glasses    for reading    Assessment: Patient Reported Symptoms:  Cognitive Cognitive Status: Alert and oriented to person, place, and time Cognitive/Intellectual Conditions Management [RPT]: None reported or documented in medical history or problem list   Health Maintenance Behaviors: Annual physical exam, Healthy diet, Immunizations  Neurological Neurological Review of Symptoms: Not assessed    HEENT HEENT Symptoms Reported: Not assessed      Cardiovascular Cardiovascular Symptoms Reported: No symptoms reported Does patient have uncontrolled Hypertension?: No Cardiovascular Conditions: Hypertension, Coronary artery disease Cardiovascular Management Strategies: Medication therapy, Routine screening Cardiovascular Self-Management  Outcome: 4 (good)  Respiratory Respiratory Symptoms Reported: Not assesed    Endocrine Is patient diabetic?: Yes Is patient checking blood sugars at home?: Yes Endocrine Conditions: Diabetes Endocrine Management Strategies: Routine screening, Diet modification  Gastrointestinal Gastrointestinal Symptoms Reported: Not assessed      Genitourinary Genitourinary Symptoms Reported: Blood in urine Genitourinary Conditions: Urinary tract infection Genitourinary Management Strategies: Medication therapy, Fluid modification Genitourinary Self-Management Outcome: 3 (uncertain) Genitourinary Comment: Patient is established with Urology who is managing UTI's.  Integumentary Integumentary Symptoms Reported: Not assessed    Musculoskeletal Musculoskelatal Symptoms Reviewed: Not assessed        Psychosocial Psychosocial Symptoms Reported: Not assessed     Quality of Family Relationships: helpful, supportive Do you feel physically threatened by others?: No      08/24/2023   11:14 AM  Depression screen PHQ 2/9  Down, Depressed, Hopeless 0  PHQ - 2 Score 0  Altered sleeping 0  Tired, decreased energy 0  Change in appetite 0  Feeling bad or failure about yourself  0  Trouble concentrating 0  Moving slowly or fidgety/restless 0  Suicidal thoughts 0  PHQ-9 Score 0  Difficult doing work/chores Not difficult at all    There were no vitals filed for this visit.  Medications Reviewed Today     Reviewed by Kaylene Pascal, RN (Registered Nurse) on 12/24/23 at 1033  Med List Status: <None>   Medication Order Taking? Sig Documenting Provider Last Dose Status Informant  acetaminophen  (TYLENOL ) 500 MG tablet [id number] No Take 1  tablet (500 mg total) by mouth every 6 (six) hours.  Patient not taking: Reported on 12/24/2023   Nelly Banco, PA-C Not Taking Active Self  albuterol  (VENTOLIN  HFA) 108 (90 Base) MCG/ACT inhaler [id number] Yes TAKE 2 PUFFS BY MOUTH EVERY 6 HOURS AS NEEDED FOR  WHEEZE OR SHORTNESS OF BREATH Heilingoetter, Cassandra L, PA-C Taking Active Self  aspirin  EC 81 MG tablet [id number] Yes Take 81 mg by mouth at bedtime. [provider] Taking Active Self  atorvastatin  (LIPITOR ) 80 MG tablet [id number] Yes Take 1 tablet (80 mg total) by mouth daily at 6 PM. Patwardhan, Kaye Parsons, MD Taking Active   BREZTRI  AEROSPHERE 160-9-4.8 MCG/ACT AERO inhaler [id number] Yes INHALE 2 PUFFS INTO THE LUNGS IN THE MORNING AND ALSO 2 PUFFS AT BEDTIME Denson Flake, MD Taking Active   cyclobenzaprine  (FLEXERIL ) 10 MG tablet [id number] No Take 1 tablet (10 mg total) by mouth 3 (three) times daily as needed for muscle spasms.  Patient not taking: Reported on 12/24/2023   [name], FNP Not Taking Active Self  estradiol  (ESTRACE ) 0.1 MG/GM vaginal cream [id number] No Place 0.5g twice a week at opening of vagina  Patient not taking: Reported on 12/24/2023   Arma Lamp, MD Not Taking Active   ezetimibe  (ZETIA ) 10 MG tablet [id number] Yes Take 1 tablet (10 mg total) by mouth daily. Jann Melody, MD Taking Active Self  ferrous sulfate  325 (65 FE) MG EC tablet [id number] Yes TAKE 1 TABLET BY MOUTH EVERY DAY WITH BREAKFAST Heilingoetter, Cassandra L, PA-C Taking Active   hydrOXYzine  (ATARAX ) 10 MG tablet [id number] No Take 1 tablet (10 mg total) by mouth 3 (three) times daily as needed.  Patient not taking: Reported on 12/24/2023   [name], FNP Not Taking Active Self  levothyroxine  (SYNTHROID ) 175 MCG tablet [id number] Yes TAKE 1 TABLET BY MOUTH EVERY MORNING ON MONDAY - FRIDAY  Patient taking differently: 11/05/23 TAKE 1 TABLET BY MOUTH EVERY MORNING MONDAY - SUNDAY STARTING ON 11/16/23 per [name] FNP   [name], FNP Taking Active   levothyroxine  (SYNTHROID ) 200 MCG tablet [id number]  TAKE 1 TABLET EVERY OTHER DAY. ALTERNATE BETWEEN THE 175 MCG AND THE 200 MCG  Patient not taking: No sig reported   Heilingoetter, Cassandra L, PA-C  Consider Medication  Status and Discontinue (Change in therapy) Self           Med Note (Rodina Pinales L   Thu Nov 05, 2023  9:34 PM) 11/05/23 Hold weekend doses x 2 weekends starting on 11/07/23, then discontinue this dosage per [name] Moore FNP  lisinopril  (ZESTRIL ) 10 MG tablet [id number] Yes TAKE 1 TABLET BY MOUTH EVERY DAY [name], FNP Taking Active Self  metoprolol  succinate (TOPROL -XL) 50 MG 24 hr tablet [id number] Yes Take 1 tablet (50 mg total) by mouth daily. Take with or immediately following a meal. Patwardhan, Manish J, MD Taking Active   nitrofurantoin  (MACRODANTIN ) 100 MG capsule [id number] Yes Take 100 mg by mouth daily. [provider] Taking Active   nitrofurantoin , macrocrystal-monohydrate, (MACROBID ) 100 MG capsule [id number]  Take 100 mg by mouth daily. Prescriber is Dr. Clarke Crouch, [facility] [provider]  Consider Medication Status and Discontinue (Change in therapy) Self  nitroGLYCERIN  (NITROSTAT ) 0.4 MG SL tablet [id number]  PLACE 1 TABLET UNDER THE TONGUE EVERY 5 (FIVE) MINUTES X 3 DOSES AS NEEDED FOR CHEST PAIN. [name], FNP  Active Self           Med Note [facility], OLALEKAN  Lavanda Porter Oct 11, 2023 12:10 PM)    OXYGEN  [id number] Yes Inhale 3 L into the lungs continuous. [provider] Taking Active Self  prochlorperazine  (COMPAZINE ) 10 MG tablet [id number] No Take 1 tablet (10 mg total) by mouth every 6 (six) hours as needed for nausea or vomiting.  Patient not taking: Reported on 12/24/2023   Heilingoetter, Leita Purdue, PA-C Not Taking Active Self           Med Note Nolan Battle, [id number] I   Sun Sep 07, 2022  3:06 AM)    trospium  (SANCTURA ) 20 MG tablet [id number] Yes Take 1 tablet (20 mg total) by mouth 2 (two) times daily. Arma Lamp, MD Taking Active             Recommendation:   Schedule a lab visit with PCP office on or around 01/19/24 to f/u on urinalysis/urine culture  Follow Up Plan:   Telephone follow up appointment date/time:   Friday, May 16 at 12:30 PM  Louanne Roussel RN BSN CCM [place]  [facility], [facility] Nurse Care Coordinator  Direct Dial: [phone] Website: [email or web address]

## 2023-12-24 NOTE — Patient Instructions (Signed)
 Visit Information  Thank you for taking time to visit with me today. Please don't hesitate to contact me if I can be of assistance to you before our next scheduled appointment.  Our next appointment is by telephone on Friday, May 16 at 12:30 PM Please call the care guide team at [phone] if you need to cancel or reschedule your appointment.   Following is a copy of your care plan:   Goals Addressed             This Visit's Progress    COMPLETED: Schedule Cardiology appointment for evaluation of acute chest pain       Care Coordination Interventions:/ See new goal       COMPLETED: To have urinary frequency and hematuria futher evaluated       Care Coordination Interventions: See new goal      COMPLETED: To improve thryoid levels       Care Coordination Interventions: Evaluation of current treatment plan related to hypothyroidism  and patient's adherence to plan as established by provider Review of patient status, including review of consultant's reports, relevant laboratory and other test results, and medication reconciliation completed with no discrepancies noted Component Ref Range & Units (hover) 2 d ago (12/22/23)  TSH 0.505  Free T4 1.77       VBCI RN Care Plan related to Chronic Kidney disease with recurrent UTI's       Problems:  Chronic Disease Management support and education needs related to CKD Stage 3b Transportation barriers  Goal: Over the next 90 days the Patient will continue to work with Medical illustrator and/or Social Worker to address care management and care coordination needs related to CKD Stage 3b as evidenced by adherence to care management team scheduled appointments      Interventions:    Chronic Kidney Disease Interventions: Assessed the Patient understanding of chronic kidney disease    Evaluation of current treatment plan related to chronic kidney disease self management and patient's adherence to plan as established by provider      Discussed patient experienced symptoms suggestive of a UTI that warranted an urgent care visit, she was contacted yesterday with instructions to start Cipro  250 mg bid x 10 days   Educated regarding indication of usage and SE to be aware of, instructed patient to complete the full course of the antibiotic as prescribed  Confirmed patient has increased her daily water intake to 48-64 oz water daily  Reviewed medications with patient and completed medication reconciliation with no discrepancies noted   Discussed with patient, PCP recommendations for a urine recheck approximately 14 days after completing her full course of antibiotics  Provided education on kidney disease progression    Last practice recorded BP readings:  BP Readings from Last 3 Encounters:  12/22/23 120/70  12/03/23 131/69  10/28/23 (!) 182/74   Most recent eGFR/CrCl:  Lab Results  Component Value Date   EGFR 40 (L) 12/22/2023    No components found for: "CRCL"  Patient Self-Care Activities:  Attend all scheduled provider appointments Call pharmacy for medication refills 3-7 days in advance of running out of medications Call provider office for new concerns or questions  Take medications as prescribed  Continue to stay well hydrated with water, aiming for 48-64 oz daily unless otherwise directed  Report any/all symptoms of UTI to your PCP and or Urologist promptly in order to get early treatment   Work with the nurse care manager to address care coordination needs and  will continue to work with the clinical team to address health care and disease management related needs  Plan:  Call your PCP office to schedule a lab visit for 01/19/24 for a urine recheck Telephone follow up appointment with care management team member scheduled for: Friday, May 16 @12 :30 PM          VBCI RN Care Plan related to Primary Hypertension, CAD and Mixed Hyperlipidemia       Problems:  Chronic Disease Management support and education  needs related to CAD and HTN Transportation barriers  Goal: Over the next 90 days the Patient will continue to work with Medical illustrator and/or Social Worker to address care management and care coordination needs related to CAD and HTN as evidenced by adherence to care management team scheduled appointments      Interventions:   CAD Interventions: Assessed understanding of CAD diagnosis Medications reviewed including medications utilized in CAD treatment plan Provided education on importance of blood pressure control in management of CAD Provided education on Importance of limiting foods high in cholesterol Reviewed Importance of taking all medications as prescribed Reviewed Importance of attending all scheduled provider appointments   Hypertension Interventions: Last practice recorded BP readings:  BP Readings from Last 3 Encounters:  12/22/23 120/70  12/03/23 131/69  10/28/23 (!) 182/74   Most recent eGFR/CrCl:  Lab Results  Component Value Date   EGFR 40 (L) 12/22/2023    No components found for: "CRCL"  Advised patient, providing education and rationale, to monitor blood pressure at least three times daily at different times of the day and record, calling PCP for findings outside established parameters Follow a low Sodium diet   Patient Self-Care Activities:  Attend all scheduled provider appointments Call pharmacy for medication refills 3-7 days in advance of running out of medications Call provider office for new concerns or questions  Perform all self care activities independently  check blood pressure 3 times per week keep a blood pressure log take blood pressure log to all doctor appointments call doctor for signs and symptoms of high blood pressure keep all doctor appointments take medications for blood pressure exactly as prescribed Follow a low Sodium, low fat diet as directed   Plan:  Keep your in person office visit with Melissa Maccia Pharm D with [facility] scheduled for Monday, June 9 at 2:30 PM Telephone follow up appointment with care management team member scheduled for:  Friday, May 16 12:30 PM             Please call 1-800-273-TALK (toll free, 24 hour hotline) if you are experiencing a Mental Health or Behavioral Health Crisis or need someone to talk to.  The patient verbalized understanding of instructions, educational materials, and care plan provided today and DECLINED offer to receive copy of patient instructions, educational materials, and care plan.   Louanne Roussel RN BSN CCM [place]  [facility], [facility] Nurse Care Coordinator  Direct Dial: [phone] Website: [email or web address]

## 2024-01-03 ENCOUNTER — Encounter: Payer: Self-pay | Admitting: Nurse Practitioner

## 2024-01-03 NOTE — Assessment & Plan Note (Signed)

## 2024-01-03 NOTE — Assessment & Plan Note (Signed)
 Hemoglobin A1c is stable.  Continue to focus on diet control

## 2024-01-03 NOTE — Assessment & Plan Note (Signed)
 Blood pressure is well-controlled.  Continue follow-up with cardiology.  And continue current medications.

## 2024-01-03 NOTE — Assessment & Plan Note (Signed)
Will check thyroid levels

## 2024-01-03 NOTE — Assessment & Plan Note (Signed)
 She is currently completing a round of antibiotics I have advised her to return a week after completing with a urine sample to check to see if she is cleared.  She did get this medication from an urgent care.

## 2024-01-08 ENCOUNTER — Telehealth: Payer: Self-pay

## 2024-01-18 DIAGNOSIS — J449 Chronic obstructive pulmonary disease, unspecified: Secondary | ICD-10-CM | POA: Diagnosis not present

## 2024-01-18 DIAGNOSIS — J9610 Chronic respiratory failure, unspecified whether with hypoxia or hypercapnia: Secondary | ICD-10-CM | POA: Diagnosis not present

## 2024-01-18 DIAGNOSIS — C3490 Malignant neoplasm of unspecified part of unspecified bronchus or lung: Secondary | ICD-10-CM | POA: Diagnosis not present

## 2024-01-19 ENCOUNTER — Other Ambulatory Visit: Payer: Self-pay

## 2024-01-19 DIAGNOSIS — N390 Urinary tract infection, site not specified: Secondary | ICD-10-CM | POA: Diagnosis not present

## 2024-01-20 NOTE — Progress Notes (Signed)
 Little, Skeeter Dukes, RN  Dino Frank, [place] Please contact patient to reschedule missed nurse call. Thank you.

## 2024-01-22 LAB — URINE CULTURE

## 2024-01-27 ENCOUNTER — Telehealth: Payer: Self-pay

## 2024-01-27 NOTE — Progress Notes (Signed)
 Complex Care Management Care Guide Note  01/27/2024 Name: Abigail [name] MRN: [id number] DOB: [birth year]  [name] is a 88 y.o. year old female who is a primary care patient of [name], FNP and is actively engaged with the care management team. I reached out to Carollee Circle by phone today to assist with re-scheduling  with the RN Case Manager.  Follow up plan: Telephone appointment with complex care management team member scheduled for:  01-28-24  Creola Doheny [facility], [facility] Guide  Direct Dial: [phone]  Fax [phone]

## 2024-01-28 ENCOUNTER — Encounter (HOSPITAL_COMMUNITY): Payer: Self-pay

## 2024-01-28 ENCOUNTER — Other Ambulatory Visit: Payer: Self-pay

## 2024-01-28 ENCOUNTER — Emergency Department (HOSPITAL_COMMUNITY)

## 2024-01-28 ENCOUNTER — Inpatient Hospital Stay (HOSPITAL_COMMUNITY)
Admission: EM | Admit: 2024-01-28 | Discharge: 2024-02-04 | DRG: 690 | Disposition: A | Discharge status: Home / Self Care | Source: Ambulatory Visit | Attending: Internal Medicine | Admitting: Internal Medicine

## 2024-01-28 DIAGNOSIS — I129 Hypertensive chronic kidney disease with stage 1 through stage 4 chronic kidney disease, or unspecified chronic kidney disease: Secondary | ICD-10-CM | POA: Diagnosis present

## 2024-01-28 DIAGNOSIS — Z8709 Personal history of other diseases of the respiratory system: Secondary | ICD-10-CM

## 2024-01-28 DIAGNOSIS — Z9221 Personal history of antineoplastic chemotherapy: Secondary | ICD-10-CM

## 2024-01-28 DIAGNOSIS — J9611 Chronic respiratory failure with hypoxia: Secondary | ICD-10-CM | POA: Diagnosis present

## 2024-01-28 DIAGNOSIS — N3000 Acute cystitis without hematuria: Principal | ICD-10-CM | POA: Diagnosis present

## 2024-01-28 DIAGNOSIS — I252 Old myocardial infarction: Secondary | ICD-10-CM

## 2024-01-28 DIAGNOSIS — E785 Hyperlipidemia, unspecified: Secondary | ICD-10-CM | POA: Diagnosis present

## 2024-01-28 DIAGNOSIS — Z8679 Personal history of other diseases of the circulatory system: Secondary | ICD-10-CM | POA: Diagnosis not present

## 2024-01-28 DIAGNOSIS — E039 Hypothyroidism, unspecified: Secondary | ICD-10-CM | POA: Diagnosis present

## 2024-01-28 DIAGNOSIS — R9431 Abnormal electrocardiogram [ECG] [EKG]: Secondary | ICD-10-CM | POA: Diagnosis present

## 2024-01-28 DIAGNOSIS — Z79899 Other long term (current) drug therapy: Secondary | ICD-10-CM

## 2024-01-28 DIAGNOSIS — Z923 Personal history of irradiation: Secondary | ICD-10-CM

## 2024-01-28 DIAGNOSIS — Z87440 Personal history of urinary (tract) infections: Secondary | ICD-10-CM

## 2024-01-28 DIAGNOSIS — N183 Chronic kidney disease, stage 3 unspecified: Secondary | ICD-10-CM | POA: Diagnosis present

## 2024-01-28 DIAGNOSIS — Z87891 Personal history of nicotine dependence: Secondary | ICD-10-CM

## 2024-01-28 DIAGNOSIS — R29898 Other symptoms and signs involving the musculoskeletal system: Secondary | ICD-10-CM | POA: Insufficient documentation

## 2024-01-28 DIAGNOSIS — K5730 Diverticulosis of large intestine without perforation or abscess without bleeding: Secondary | ICD-10-CM | POA: Diagnosis not present

## 2024-01-28 DIAGNOSIS — R001 Bradycardia, unspecified: Secondary | ICD-10-CM | POA: Diagnosis present

## 2024-01-28 DIAGNOSIS — K449 Diaphragmatic hernia without obstruction or gangrene: Secondary | ICD-10-CM | POA: Diagnosis not present

## 2024-01-28 DIAGNOSIS — N1832 Chronic kidney disease, stage 3b: Secondary | ICD-10-CM | POA: Diagnosis not present

## 2024-01-28 DIAGNOSIS — J449 Chronic obstructive pulmonary disease, unspecified: Secondary | ICD-10-CM | POA: Diagnosis present

## 2024-01-28 DIAGNOSIS — E782 Mixed hyperlipidemia: Secondary | ICD-10-CM | POA: Diagnosis not present

## 2024-01-28 DIAGNOSIS — I2510 Atherosclerotic heart disease of native coronary artery without angina pectoris: Secondary | ICD-10-CM | POA: Diagnosis present

## 2024-01-28 DIAGNOSIS — A498 Other bacterial infections of unspecified site: Secondary | ICD-10-CM | POA: Diagnosis not present

## 2024-01-28 DIAGNOSIS — N281 Cyst of kidney, acquired: Secondary | ICD-10-CM | POA: Diagnosis not present

## 2024-01-28 DIAGNOSIS — Z1623 Resistance to quinolones and fluoroquinolones: Secondary | ICD-10-CM | POA: Diagnosis present

## 2024-01-28 DIAGNOSIS — E7849 Other hyperlipidemia: Secondary | ICD-10-CM | POA: Diagnosis not present

## 2024-01-28 DIAGNOSIS — N390 Urinary tract infection, site not specified: Secondary | ICD-10-CM

## 2024-01-28 DIAGNOSIS — N1831 Chronic kidney disease, stage 3a: Secondary | ICD-10-CM | POA: Diagnosis not present

## 2024-01-28 DIAGNOSIS — N200 Calculus of kidney: Secondary | ICD-10-CM | POA: Diagnosis present

## 2024-01-28 DIAGNOSIS — I4510 Unspecified right bundle-branch block: Secondary | ICD-10-CM | POA: Diagnosis not present

## 2024-01-28 DIAGNOSIS — R918 Other nonspecific abnormal finding of lung field: Secondary | ICD-10-CM | POA: Diagnosis not present

## 2024-01-28 DIAGNOSIS — M6281 Muscle weakness (generalized): Secondary | ICD-10-CM | POA: Diagnosis present

## 2024-01-28 DIAGNOSIS — I517 Cardiomegaly: Secondary | ICD-10-CM | POA: Diagnosis not present

## 2024-01-28 DIAGNOSIS — Z85118 Personal history of other malignant neoplasm of bronchus and lung: Secondary | ICD-10-CM

## 2024-01-28 DIAGNOSIS — Z9981 Dependence on supplemental oxygen: Secondary | ICD-10-CM

## 2024-01-28 DIAGNOSIS — N179 Acute kidney failure, unspecified: Secondary | ICD-10-CM | POA: Diagnosis not present

## 2024-01-28 DIAGNOSIS — B965 Pseudomonas (aeruginosa) (mallei) (pseudomallei) as the cause of diseases classified elsewhere: Secondary | ICD-10-CM | POA: Diagnosis present

## 2024-01-28 DIAGNOSIS — R0989 Other specified symptoms and signs involving the circulatory and respiratory systems: Secondary | ICD-10-CM | POA: Diagnosis not present

## 2024-01-28 DIAGNOSIS — I10 Essential (primary) hypertension: Secondary | ICD-10-CM | POA: Diagnosis present

## 2024-01-28 DIAGNOSIS — Z7982 Long term (current) use of aspirin: Secondary | ICD-10-CM

## 2024-01-28 DIAGNOSIS — Z79890 Hormone replacement therapy (postmenopausal): Secondary | ICD-10-CM

## 2024-01-28 DIAGNOSIS — E038 Other specified hypothyroidism: Secondary | ICD-10-CM | POA: Diagnosis not present

## 2024-01-28 DIAGNOSIS — Z8249 Family history of ischemic heart disease and other diseases of the circulatory system: Secondary | ICD-10-CM

## 2024-01-28 DIAGNOSIS — K8689 Other specified diseases of pancreas: Secondary | ICD-10-CM | POA: Diagnosis not present

## 2024-01-28 LAB — URINALYSIS, ROUTINE W REFLEX MICROSCOPIC
Bilirubin Urine: NEGATIVE
Glucose, UA: NEGATIVE mg/dL
Hgb urine dipstick: NEGATIVE
Ketones, ur: NEGATIVE mg/dL
Nitrite: NEGATIVE
Protein, ur: 30 mg/dL — AB
Specific Gravity, Urine: 1.011 (ref 1.005–1.030)
WBC, UA: >50 WBC/hpf (ref 0–5)
pH: 6 (ref 5.0–8.0)

## 2024-01-28 LAB — CBC WITH DIFFERENTIAL/PLATELET
Abs Immature Granulocytes: 0.02 10*3/uL (ref 0.00–0.07)
Basophils Absolute: 0 10*3/uL (ref 0.0–0.1)
Basophils Relative: 1 %
Eosinophils Absolute: 0.2 10*3/uL (ref 0.0–0.5)
Eosinophils Relative: 3 %
HCT: 37 % (ref 36.0–46.0)
Hemoglobin: 11.3 g/dL — ABNORMAL LOW (ref 12.0–15.0)
Immature Granulocytes: 0 %
Lymphocytes Relative: 10 %
Lymphs Abs: 0.6 10*3/uL — ABNORMAL LOW (ref 0.7–4.0)
MCH: 28.7 pg (ref 26.0–34.0)
MCHC: 30.5 g/dL (ref 30.0–36.0)
MCV: 93.9 fL (ref 80.0–100.0)
Monocytes Absolute: 0.6 10*3/uL (ref 0.1–1.0)
Monocytes Relative: 10 %
Neutro Abs: 4.6 10*3/uL (ref 1.7–7.7)
Neutrophils Relative %: 76 %
Platelets: 212 10*3/uL (ref 150–400)
RBC: 3.94 MIL/uL (ref 3.87–5.11)
RDW: 14.2 % (ref 11.5–15.5)
WBC: 6 10*3/uL (ref 4.0–10.5)
nRBC: 0 % (ref 0.0–0.2)

## 2024-01-28 LAB — COMPREHENSIVE METABOLIC PANEL WITH GFR
ALT: 16 U/L (ref 0–44)
AST: 18 U/L (ref 15–41)
Albumin: 3.8 g/dL (ref 3.5–5.0)
Alkaline Phosphatase: 102 U/L (ref 38–126)
Anion gap: 8 (ref 5–15)
BUN: 39 mg/dL — ABNORMAL HIGH (ref 8–23)
CO2: 27 mmol/L (ref 22–32)
Calcium: 9.2 mg/dL (ref 8.9–10.3)
Chloride: 103 mmol/L (ref 98–111)
Creatinine, Ser: 1.28 mg/dL — ABNORMAL HIGH (ref 0.44–1.00)
GFR, Estimated: 41 mL/min — ABNORMAL LOW (ref >60)
Glucose, Bld: 109 mg/dL — ABNORMAL HIGH (ref 70–99)
Potassium: 4.6 mmol/L (ref 3.5–5.1)
Sodium: 138 mmol/L (ref 135–145)
Total Bilirubin: 0.5 mg/dL (ref 0.0–1.2)
Total Protein: 7.8 g/dL (ref 6.5–8.1)

## 2024-01-28 MED ORDER — PIPERACILLIN-TAZOBACTAM 3.375 G IVPB 30 MIN
3.3750 g | Freq: Three times a day (TID) | INTRAVENOUS | Status: AC
  Administered 2024-01-29: 3.375 g via INTRAVENOUS
  Filled 2024-01-28: qty 50

## 2024-01-28 NOTE — ED Notes (Signed)
 Pt. Assisted to bathroom for safety in wheelchair.

## 2024-01-28 NOTE — Patient Outreach (Signed)
 Complex Care Management   Visit Note  01/28/2024  Name:  Abigail [name] MRN: [id number] DOB: 03/13/1936  Situation: Referral received for Complex Care Management related to Hypertensive heart and Renal disease, Recurrent UTI, Chronic Kidney Disease, Stage 3b, Hypothyroidism, Malignant neoplasm of unspecified part of unspecified bronchus or lung. I obtained verbal consent from Patient.  Visit completed with patient on the phone.  Background:   Past Medical History:  Diagnosis Date   Anemia    Arthritis    knees, back   Bacteremia 04/08/2012   CAP (community acquired pneumonia) 04/12/2012   Carotid artery occlusion    Chronic kidney disease    stage 3 ckd no nephrologist   Complete uterine prolapse with prolapse of anterior vaginal wall    Complication of anesthesia    hard to wake up per pt   Constipation    Coronary artery disease    Diverticulitis yrs ago coialitis   Dyspnea    History of blood transfusion    History of radiation therapy    right lung 08/05/2021-09/18/2021  Dr Retta Caster   Hypertension    Hypothyroid    LLQ abdominal pain 04/05/2012   Lung cancer (HCC)    Numbness    in hands at times   Pneumonia    Pre-diabetes    Scoliosis    STEMI (ST elevation myocardial infarction) (HCC) 10/26/2019   DES RCA   Wears dentures    full dentures   Wears glasses    for reading    Assessment: Patient Reported Symptoms:  Cognitive Cognitive Status: Alert and oriented to person, place, and time Cognitive/Intellectual Conditions Management [RPT]: None reported or documented in medical history or problem list   Health Maintenance Behaviors: Annual physical exam  Neurological      HEENT HEENT Symptoms Reported: Not assessed      Cardiovascular Cardiovascular Symptoms Reported: Other:, Fatigue Other Cardiovascular Symptoms: heart rate "thumping" Does patient have uncontrolled Hypertension?: Yes Is patient checking Blood Pressure at home?: Yes Patient's Recent BP  reading at home: 163/73 Cardiovascular Conditions: Hypertension, Coronary artery disease (Coronary artery disease of native artery of native heart with stable angina pectoris; Hypertensive heart and renal disease; Aortic Atherosclerosis) Cardiovascular Management Strategies: Routine screening, Medication therapy Cardiovascular Self-Management Outcome: 3 (uncertain)  Respiratory Respiratory Symptoms Reported: No symptoms reported    Endocrine Patient reports the following symptoms related to hypoglycemia or hyperglycemia : Not assessed    Gastrointestinal Gastrointestinal Symptoms Reported: Not assessed      Genitourinary Genitourinary Symptoms Reported: Frequency Additional Genitourinary Details: positive for UTI, patient will require IV antibiotics, she will go to [facility] Genitourinary Conditions: Chronic kidney disease, Urinary tract infection, Frequency Genitourinary Management Strategies: Medication therapy Genitourinary Self-Management Outcome: 3 (uncertain)  Integumentary Integumentary Symptoms Reported: Not assessed    Musculoskeletal Musculoskelatal Symptoms Reviewed: Not assessed        Psychosocial Psychosocial Symptoms Reported: Not assessed     Quality of Family Relationships: supportive      08/24/2023   11:14 AM  Depression screen PHQ 2/9  Down, Depressed, Hopeless 0  PHQ - 2 Score 0  Altered sleeping 0  Tired, decreased energy 0  Change in appetite 0  Feeling bad or failure about yourself  0  Trouble concentrating 0  Moving slowly or fidgety/restless 0  Suicidal thoughts 0  PHQ-9 Score 0  Difficult doing work/chores Not difficult at all    Vitals:   01/28/24 1430  BP: (!) 163/73  Pulse: 79  Medications Reviewed Today     Reviewed by Kaylene Pascal, RN (Registered Nurse) on 01/28/24 at 1427  Med List Status: <None>   Medication Order Taking? Sig Documenting Provider Last Dose Status Informant  albuterol  (VENTOLIN  HFA) 108 (90 Base)  MCG/ACT inhaler [id number]  TAKE 2 PUFFS BY MOUTH EVERY 6 HOURS AS NEEDED FOR WHEEZE OR SHORTNESS OF BREATH Heilingoetter, Cassandra L, PA-C  Active Self  aspirin  EC 81 MG tablet [id number]  Take 81 mg by mouth at bedtime. [provider]  Active Self  atorvastatin  (LIPITOR ) 80 MG tablet [id number]  Take 1 tablet (80 mg total) by mouth daily at 6 PM. Patwardhan, Kaye Parsons, MD  Active   BREZTRI  AEROSPHERE 160-9-4.8 MCG/ACT AERO inhaler [id number]  INHALE 2 PUFFS INTO THE LUNGS IN THE MORNING AND ALSO 2 PUFFS AT BEDTIME Denson Flake, MD  Active   ciprofloxacin  (CIPRO ) 250 MG tablet [id number]  Take 250 mg by mouth 2 (two) times daily. [provider]  Consider Medication Status and Discontinue (Completed Course)   estradiol  (ESTRACE ) 0.1 MG/GM vaginal cream [id number]  Place 0.5g twice a week at opening of vagina  Patient not taking: Reported on 12/24/2023   Arma Lamp, MD  Active   ezetimibe  (ZETIA ) 10 MG tablet [id number]  Take 1 tablet (10 mg total) by mouth daily. Jann Melody, MD  Active Self  ferrous sulfate  325 (65 FE) MG EC tablet [id number]  TAKE 1 TABLET BY MOUTH EVERY DAY WITH BREAKFAST Heilingoetter, Cassandra L, PA-C  Active   levothyroxine  (SYNTHROID ) 175 MCG tablet [id number]  TAKE 1 TABLET BY MOUTH EVERY MORNING ON MONDAY - FRIDAY  Patient taking differently: 11/05/23 TAKE 1 TABLET BY MOUTH EVERY MORNING MONDAY - SUNDAY STARTING ON 11/16/23 per [name] FNP   [name], FNP  Active   levothyroxine  (SYNTHROID ) 200 MCG tablet [id number]  TAKE 1 TABLET EVERY OTHER DAY. ALTERNATE BETWEEN THE 175 MCG AND THE 200 MCG  Patient not taking: No sig reported   Heilingoetter, Cassandra L, PA-C  Active Self           Med Note (Yurani Fettes L   Thu Nov 05, 2023  9:34 PM) 11/05/23 Hold weekend doses x 2 weekends starting on 11/07/23, then discontinue this dosage per [name] Moore FNP  lisinopril  (ZESTRIL ) 10 MG tablet [id number]  TAKE 1 TABLET BY MOUTH EVERY DAY  [name], FNP  Active Self  metoprolol  succinate (TOPROL -XL) 50 MG 24 hr tablet [id number]  Take 1 tablet (50 mg total) by mouth daily. Take with or immediately following a meal. Patwardhan, Manish J, MD  Active   nitrofurantoin  (MACRODANTIN ) 100 MG capsule [id number] Yes Take 100 mg by mouth daily. [provider] Taking Active   nitrofurantoin , macrocrystal-monohydrate, (MACROBID ) 100 MG capsule [id number] Yes Take 100 mg by mouth daily. Prescriber is Dr. Clarke Crouch, [facility] [provider] Taking Active Self  nitroGLYCERIN  (NITROSTAT ) 0.4 MG SL tablet [id number]  PLACE 1 TABLET UNDER THE TONGUE EVERY 5 (FIVE) MINUTES X 3 DOSES AS NEEDED FOR CHEST PAIN. [name], FNP  Active Self           Med Note Andrea Kay Oct 11, 2023 12:10 PM)    OXYGEN  [id number]  Inhale 3 L into the lungs continuous. [provider]  Active Self  trospium  (SANCTURA ) 20 MG tablet [id number]  Take 1 tablet (20 mg total) by mouth 2 (two) times daily. Arma Lamp, MD  Active  Recommendation:   Go to [facility] to seek IV treatment for urinary tract infection   Follow Up Plan:   Telephone follow up appointment date/time:  Wednesday, June 18 at 09:00 AM  Louanne Roussel RN BSN CCM [place]  [facility], [facility] Nurse Care Coordinator  Direct Dial: [phone] Website: [email or web address]

## 2024-01-28 NOTE — ED Triage Notes (Addendum)
 Pt came in for UTI diagnosis. Her doctor stated she would need to get antibiotics for her culture results. Pt was given PO antibiotics but it has not been working.

## 2024-01-28 NOTE — ED Provider Notes (Signed)
 [place] EMERGENCY DEPARTMENT AT [facility] Provider Note   CSN: [id number] Arrival date & time: 01/28/24  1538     History UTI Chief Complaint  Patient presents with   Urinary Tract Infection    Abigail [name] is a 88 y.o. female.   88 y.o female with a PMH of recurrent UTI presents to the ED with a chief complaint of urinary tract infection. Patient reports she has taken ciprofloxacin  for approximately 3 weeks.  Last stopped her antibiotics yesterday, has not had any improvement in her symptoms.  She was seen by healthcare nurse via telephone, they told her that she needed to come into the emergency department as her last urine culture grew Pseudomonas.  She also continues to endorse foggy memory kind of like when I have a UTI .  She has not run any fevers, no flank pain, no nausea, no vomiting.  The history is provided by the patient.  Urinary Tract Infection Associated symptoms: no abdominal pain, no fever, no flank pain, no nausea and no vomiting        Home Medications Prior to Admission medications   Medication Sig Start Date End Date Taking? Authorizing Provider  albuterol  (VENTOLIN  HFA) 108 (90 Base) MCG/ACT inhaler TAKE 2 PUFFS BY MOUTH EVERY 6 HOURS AS NEEDED FOR WHEEZE OR SHORTNESS OF BREATH 12/20/21  Yes Heilingoetter, Cassandra L, PA-C  aspirin  EC 81 MG tablet Take 81 mg by mouth at bedtime.   Yes [provider]  atorvastatin  (LIPITOR ) 80 MG tablet Take 1 tablet (80 mg total) by mouth daily at 6 PM. 10/28/23  Yes Patwardhan, Manish J, MD  BREZTRI  AEROSPHERE 160-9-4.8 MCG/ACT AERO inhaler INHALE 2 PUFFS INTO THE LUNGS IN THE MORNING AND ALSO 2 PUFFS AT BEDTIME 12/08/23  Yes Byrum, Delora Ferry, MD  ferrous sulfate  325 (65 FE) MG EC tablet TAKE 1 TABLET BY MOUTH EVERY DAY WITH BREAKFAST 12/03/23  Yes Heilingoetter, Cassandra L, PA-C  levothyroxine  (SYNTHROID ) 175 MCG tablet TAKE 1 TABLET BY MOUTH EVERY MORNING ON MONDAY - FRIDAY Patient taking differently:  Take 175 mcg by mouth daily before breakfast. 10/12/23  Yes Moore, [name], FNP  lisinopril  (ZESTRIL ) 10 MG tablet TAKE 1 TABLET BY MOUTH EVERY DAY 03/31/23  Yes [name], FNP  metoprolol  succinate (TOPROL -XL) 50 MG 24 hr tablet Take 1 tablet (50 mg total) by mouth daily. Take with or immediately following a meal. 12/03/23  Yes Patwardhan, Manish J, MD  nitrofurantoin  (MACRODANTIN ) 100 MG capsule Take 100 mg by mouth daily. 10/17/23  Yes [provider]  nitroGLYCERIN  (NITROSTAT ) 0.4 MG SL tablet PLACE 1 TABLET UNDER THE TONGUE EVERY 5 (FIVE) MINUTES X 3 DOSES AS NEEDED FOR CHEST PAIN. 07/28/23  Yes [name], FNP  trospium  (SANCTURA ) 20 MG tablet Take 1 tablet (20 mg total) by mouth 2 (two) times daily. 10/13/23 10/07/24 Yes Arma Lamp, MD  estradiol  (ESTRACE ) 0.1 MG/GM vaginal cream Place 0.5g twice a week at opening of vagina Patient not taking: Reported on 12/24/2023 10/13/23   Arma Lamp, MD  ezetimibe  (ZETIA ) 10 MG tablet Take 1 tablet (10 mg total) by mouth daily. Patient not taking: Reported on 01/29/2024 07/31/21   Gloriann Larger A, MD  levothyroxine  (SYNTHROID ) 200 MCG tablet TAKE 1 TABLET EVERY OTHER DAY. ALTERNATE BETWEEN THE 175 MCG AND THE 200 MCG Patient not taking: Reported on 12/03/2023 09/25/23   Heilingoetter, Cassandra L, PA-C  OXYGEN  Inhale 3 L into the lungs continuous.    [provider]      Allergies    Codeine and Norvasc  [amlodipine ]    Review of Systems   Review of Systems  Constitutional:  Negative for chills and fever.  HENT:  Negative for sore throat.   Respiratory:  Negative for shortness of breath.   Cardiovascular:  Negative for chest pain.  Gastrointestinal:  Negative for abdominal pain, nausea and vomiting.  Genitourinary:  Positive for difficulty urinating and urgency. Negative for flank pain.  Musculoskeletal:  Negative for back pain.  All other systems reviewed and are negative.   Physical Exam Updated Vital  Signs BP (!) 115/44 (BP Location: Right Arm)   Pulse 64   Temp 97.8 F (36.6 C) (Oral)   Resp 14   Ht 5' 6 (1.676 m)   Wt 71.7 kg   SpO2 99%   BMI 25.50 kg/m  Physical Exam Vitals and nursing note reviewed.  Constitutional:      Appearance: Normal appearance.  HENT:     Head: Normocephalic and atraumatic.     Mouth/Throat:     Mouth: Mucous membranes are moist.  Cardiovascular:     Rate and Rhythm: Normal rate.  Pulmonary:     Effort: Pulmonary effort is normal.  Abdominal:     General: Abdomen is flat.     Tenderness: There is no right CVA tenderness or left CVA tenderness.     Comments: No CVA tenderness bilaterally.  Musculoskeletal:     Cervical back: Normal range of motion and neck supple.  Skin:    General: Skin is warm and dry.  Neurological:     Mental Status: She is alert and oriented to person, place, and time.     ED Results / Procedures / Treatments   Labs (all labs ordered are listed, but only abnormal results are displayed) Labs Reviewed  URINE CULTURE - Abnormal; Notable for the following components:      Result Value   Culture   (*)    Value: 70,000 COLONIES/mL GRAM NEGATIVE RODS IDENTIFICATION AND SUSCEPTIBILITIES TO FOLLOW Performed at [facility], [address] [place], [place] [address]    All other components within normal limits  URINALYSIS, ROUTINE W REFLEX MICROSCOPIC - Abnormal; Notable for the following components:   APPearance HAZY (*)    Protein, ur 30 (*)    Leukocytes,Ua LARGE (*)    Bacteria, UA RARE (*)    All other components within normal limits  CBC WITH DIFFERENTIAL/PLATELET - Abnormal; Notable for the following components:   Hemoglobin 11.3 (*)    Lymphs Abs 0.6 (*)    All other components within normal limits  COMPREHENSIVE METABOLIC PANEL WITH GFR - Abnormal; Notable for the following components:   Glucose, Bld 109 (*)    BUN 39 (*)    Creatinine, Ser 1.28 (*)    GFR, Estimated 41 (*)    All other  components within normal limits  CBC - Abnormal; Notable for the following components:   RBC 3.59 (*)    Hemoglobin 10.5 (*)    HCT 34.6 (*)    All other components within normal limits  BASIC METABOLIC PANEL WITH GFR - Abnormal; Notable for the following components:   Glucose, Bld 156 (*)    BUN 39 (*)    Creatinine, Ser 1.52 (*)    GFR, Estimated 33 (*)    All other components within normal limits  MAGNESIUM   CBC  BASIC METABOLIC PANEL WITH GFR    EKG  None  Radiology DG Chest 2 View Result Date: 01/28/2024 CLINICAL DATA:  The EXAM: CHEST - 2 VIEW COMPARISON:  Chest x-ray 10/11/2023 FINDINGS: The heart is enlarged. There central pulmonary vascular congestion and patchy perihilar opacity. There is no pleural effusion or pneumothorax. No acute fractures are seen. IMPRESSION: Cardiomegaly with central pulmonary vascular congestion and patchy perihilar opacity, likely pulmonary edema. Electronically Signed   By: Tyron Gallon M.D.   On: 01/28/2024 21:51    Procedures Procedures    Medications Ordered in ED Medications  aspirin  EC tablet 81 mg (has no administration in time range)  atorvastatin  (LIPITOR ) tablet 80 mg (80 mg Oral Given 01/29/24 1716)  levothyroxine  (SYNTHROID ) tablet 175 mcg (175 mcg Oral Given 01/29/24 0623)  budesonide -glycopyrrolate -formoterol  (BREZTRI ) 160-9-4.8 MCG/ACT inhaler 2 puff (2 puffs Inhalation Given 01/29/24 0826)  heparin  injection 5,000 Units (5,000 Units Subcutaneous Given 01/29/24 1342)  sodium chloride  flush (NS) 0.9 % injection 3 mL (3 mLs Intravenous Not Given 01/29/24 0946)  sodium chloride  flush (NS) 0.9 % injection 3 mL (3 mLs Intravenous Given 01/29/24 0946)  sodium chloride  flush (NS) 0.9 % injection 3 mL (has no administration in time range)  0.9 %  sodium chloride  infusion (has no administration in time range)  acetaminophen  (TYLENOL ) tablet 650 mg (has no administration in time range)    Or  acetaminophen  (TYLENOL ) suppository 650 mg (has no  administration in time range)  lisinopril  (ZESTRIL ) tablet 10 mg (10 mg Oral Given 01/29/24 0946)  ipratropium-albuterol  (DUONEB) 0.5-2.5 (3) MG/3ML nebulizer solution 3 mL (has no administration in time range)  piperacillin -tazobactam (ZOSYN ) IVPB 3.375 g (3.375 g Intravenous New Bag/Given 01/29/24 1346)  piperacillin -tazobactam (ZOSYN ) IVPB 3.375 g (0 g Intravenous Stopped 01/29/24 0113)    ED Course/ Medical Decision Making/ A&P Clinical Course as of 01/29/24 1930  Thu Jan 28, 2024  2229 Leukocytes,Ua(!): LARGE [JS]  2229 Bacteria, UA(!): RARE [JS]  Fri Jan 29, 2024  0019 Consult to Dr. Ulice Gamer, hospitalist, who is agreeable to admitting this patient to her service. I appreciate her collaboration in the care of this patient.  [RS]    Clinical Course User Index [JS] Ezechiel Stooksbury, PA-C [RS] Sponseller, Adelle Agent, PA-C                                 Medical Decision Making Amount and/or Complexity of Data Reviewed Labs: ordered. Decision-making details documented in ED Course. Radiology: ordered.  Risk Prescription drug management. Decision regarding hospitalization.   This patient presents to the ED for concern of UTI, this involves a number of treatment options, and is a complaint that carries with it a high risk of complications and morbidity.  The differential diagnosis includes sespis, UTI versus pyelonephritis.    Co morbidities: Discussed in HPI   Brief History:  SEE HPI.   EMR reviewed including pt PMHx, past surgical history and past visits to ER.   See HPI for more details   Lab Tests:  I ordered and independently interpreted labs.  The pertinent results include:    I personally reviewed all laboratory work and imaging. Metabolic panel without any acute abnormality specifically kidney function within normal limits and no significant electrolyte abnormalities. CBC without leukocytosis or significant anemia.  Imaging Studies:  NAD. I personally reviewed all  imaging studies and no acute abnormality found. I agree with radiology interpretation.  Medicines ordered:  I ordered medication including zosyn   for UTI  Reevaluation of the patient after these medicines showed that the patient stayed the same I have reviewed the patients home medicines and have made adjustments as needed  Consults:  I requested consultation with pharmacy,  and discussed lab and imaging findings as well as pertinent plan - they recommend: zosyn  for urine culture for pseudomonas   Reevaluation:  After the interventions noted above I re-evaluated patient and found that they have :stayed the same  Social Determinants of Health:  The patient's social determinants of health were a factor in the care of this patient  Problem List / ED Course:  Patient presented to the ED for treatment of IV antibiotics.  Patient has been on ciprofloxacin  over the last couple of weeks.  She reports she is tried multiple rounds of antibiotics without any improvement.  She was seen by the nursing via health care today via telephone, reports that she was told to come into the emergency department for IV antibiotics.  Today's visit she does have large leukocytes, rare bacteria, urine culture was sent off again.  Blood work is otherwise at her baseline.  Did discuss this patient with pharmacy who recommend this dosing Q8 at this time patient remains hemodynamically stable. Will place call for hospitalist in order to obtain admission at this time.  Dispostion:  After consideration of the diagnostic results and the patients response to treatment, I feel that the patent would benefit from admission for further treatment of UTI.     Portions of this note were generated with Scientist, clinical (histocompatibility and immunogenetics). Dictation errors may occur despite best attempts at proofreading.   Final Clinical Impression(s) / ED Diagnoses Final diagnoses:  Acute cystitis without hematuria    Rx / DC Orders ED Discharge  Orders     None         Melany Wiesman, PA-C 01/29/24 1930    Karlyn Overman, MD 02/04/24 2213

## 2024-01-28 NOTE — Patient Instructions (Signed)
 Visit Information  Thank you for taking time to visit with me today. Please don't hesitate to contact me if I can be of assistance to you before our next scheduled appointment.  Your next care management appointment is by telephone on Wednesday, June 18 at 09:00 AM  Please call the care guide team at [phone] if you need to cancel, schedule, or reschedule an appointment.   Please call [phone]-TALK (toll free, 24 hour hotline) if you are experiencing a Mental Health or Behavioral Health Crisis or need someone to talk to.  Louanne Roussel RN BSN CCM [place]  [facility], [facility] Health Nurse Care Coordinator  Direct Dial: [phone] Website: [email or web address]

## 2024-01-29 ENCOUNTER — Encounter (HOSPITAL_COMMUNITY): Payer: Self-pay | Admitting: Internal Medicine

## 2024-01-29 DIAGNOSIS — J449 Chronic obstructive pulmonary disease, unspecified: Secondary | ICD-10-CM | POA: Diagnosis not present

## 2024-01-29 DIAGNOSIS — Z79899 Other long term (current) drug therapy: Secondary | ICD-10-CM | POA: Diagnosis not present

## 2024-01-29 DIAGNOSIS — E039 Hypothyroidism, unspecified: Secondary | ICD-10-CM | POA: Diagnosis not present

## 2024-01-29 DIAGNOSIS — E7849 Other hyperlipidemia: Secondary | ICD-10-CM

## 2024-01-29 DIAGNOSIS — Z1623 Resistance to quinolones and fluoroquinolones: Secondary | ICD-10-CM | POA: Diagnosis not present

## 2024-01-29 DIAGNOSIS — I129 Hypertensive chronic kidney disease with stage 1 through stage 4 chronic kidney disease, or unspecified chronic kidney disease: Secondary | ICD-10-CM | POA: Diagnosis not present

## 2024-01-29 DIAGNOSIS — Z9221 Personal history of antineoplastic chemotherapy: Secondary | ICD-10-CM | POA: Diagnosis not present

## 2024-01-29 DIAGNOSIS — N200 Calculus of kidney: Secondary | ICD-10-CM | POA: Diagnosis not present

## 2024-01-29 DIAGNOSIS — Z8679 Personal history of other diseases of the circulatory system: Secondary | ICD-10-CM

## 2024-01-29 DIAGNOSIS — I2510 Atherosclerotic heart disease of native coronary artery without angina pectoris: Secondary | ICD-10-CM | POA: Diagnosis not present

## 2024-01-29 DIAGNOSIS — R9431 Abnormal electrocardiogram [ECG] [EKG]: Secondary | ICD-10-CM | POA: Insufficient documentation

## 2024-01-29 DIAGNOSIS — Z87440 Personal history of urinary (tract) infections: Secondary | ICD-10-CM | POA: Diagnosis not present

## 2024-01-29 DIAGNOSIS — N1831 Chronic kidney disease, stage 3a: Secondary | ICD-10-CM | POA: Diagnosis not present

## 2024-01-29 DIAGNOSIS — E785 Hyperlipidemia, unspecified: Secondary | ICD-10-CM | POA: Diagnosis not present

## 2024-01-29 DIAGNOSIS — E038 Other specified hypothyroidism: Secondary | ICD-10-CM

## 2024-01-29 DIAGNOSIS — N179 Acute kidney failure, unspecified: Secondary | ICD-10-CM | POA: Diagnosis not present

## 2024-01-29 DIAGNOSIS — B965 Pseudomonas (aeruginosa) (mallei) (pseudomallei) as the cause of diseases classified elsewhere: Secondary | ICD-10-CM | POA: Diagnosis not present

## 2024-01-29 DIAGNOSIS — Z923 Personal history of irradiation: Secondary | ICD-10-CM | POA: Diagnosis not present

## 2024-01-29 DIAGNOSIS — N3000 Acute cystitis without hematuria: Secondary | ICD-10-CM | POA: Diagnosis not present

## 2024-01-29 DIAGNOSIS — R29898 Other symptoms and signs involving the musculoskeletal system: Secondary | ICD-10-CM | POA: Diagnosis not present

## 2024-01-29 DIAGNOSIS — N1832 Chronic kidney disease, stage 3b: Secondary | ICD-10-CM | POA: Diagnosis not present

## 2024-01-29 DIAGNOSIS — I252 Old myocardial infarction: Secondary | ICD-10-CM | POA: Diagnosis not present

## 2024-01-29 DIAGNOSIS — Z8709 Personal history of other diseases of the respiratory system: Secondary | ICD-10-CM

## 2024-01-29 DIAGNOSIS — Z9981 Dependence on supplemental oxygen: Secondary | ICD-10-CM | POA: Diagnosis not present

## 2024-01-29 DIAGNOSIS — A498 Other bacterial infections of unspecified site: Secondary | ICD-10-CM | POA: Diagnosis not present

## 2024-01-29 DIAGNOSIS — Z79890 Hormone replacement therapy (postmenopausal): Secondary | ICD-10-CM | POA: Diagnosis not present

## 2024-01-29 DIAGNOSIS — M6281 Muscle weakness (generalized): Secondary | ICD-10-CM | POA: Diagnosis not present

## 2024-01-29 DIAGNOSIS — J9611 Chronic respiratory failure with hypoxia: Secondary | ICD-10-CM | POA: Diagnosis not present

## 2024-01-29 DIAGNOSIS — Z87891 Personal history of nicotine dependence: Secondary | ICD-10-CM | POA: Diagnosis not present

## 2024-01-29 DIAGNOSIS — Z7982 Long term (current) use of aspirin: Secondary | ICD-10-CM | POA: Diagnosis not present

## 2024-01-29 DIAGNOSIS — R001 Bradycardia, unspecified: Secondary | ICD-10-CM | POA: Diagnosis not present

## 2024-01-29 DIAGNOSIS — N390 Urinary tract infection, site not specified: Secondary | ICD-10-CM

## 2024-01-29 LAB — BASIC METABOLIC PANEL WITH GFR
Anion gap: 7 (ref 5–15)
BUN: 39 mg/dL — ABNORMAL HIGH (ref 8–23)
CO2: 26 mmol/L (ref 22–32)
Calcium: 8.9 mg/dL (ref 8.9–10.3)
Chloride: 105 mmol/L (ref 98–111)
Creatinine, Ser: 1.52 mg/dL — ABNORMAL HIGH (ref 0.44–1.00)
GFR, Estimated: 33 mL/min — ABNORMAL LOW (ref >60)
Glucose, Bld: 156 mg/dL — ABNORMAL HIGH (ref 70–99)
Potassium: 3.8 mmol/L (ref 3.5–5.1)
Sodium: 138 mmol/L (ref 135–145)

## 2024-01-29 LAB — CBC
HCT: 34.6 % — ABNORMAL LOW (ref 36.0–46.0)
Hemoglobin: 10.5 g/dL — ABNORMAL LOW (ref 12.0–15.0)
MCH: 29.2 pg (ref 26.0–34.0)
MCHC: 30.3 g/dL (ref 30.0–36.0)
MCV: 96.4 fL (ref 80.0–100.0)
Platelets: 192 10*3/uL (ref 150–400)
RBC: 3.59 MIL/uL — ABNORMAL LOW (ref 3.87–5.11)
RDW: 14.2 % (ref 11.5–15.5)
WBC: 6.5 10*3/uL (ref 4.0–10.5)
nRBC: 0 % (ref 0.0–0.2)

## 2024-01-29 LAB — MAGNESIUM: Magnesium: 2.2 mg/dL (ref 1.7–2.4)

## 2024-01-29 MED ORDER — ONDANSETRON HCL 4 MG PO TABS: 4.0000 mg | ORAL_TABLET | Freq: Four times a day (QID) | ORAL | Status: DC | PRN

## 2024-01-29 MED ORDER — BUDESON-GLYCOPYRROL-FORMOTEROL 160-9-4.8 MCG/ACT IN AERO
2.0000 | INHALATION_SPRAY | Freq: Two times a day (BID) | RESPIRATORY_TRACT | Status: DC
  Administered 2024-01-29 – 2024-02-04 (×13): 2 via RESPIRATORY_TRACT
  Filled 2024-01-29: qty 5.9

## 2024-01-29 MED ORDER — ACETAMINOPHEN 325 MG PO TABS
650.0000 mg | ORAL_TABLET | Freq: Four times a day (QID) | ORAL | Status: DC | PRN
Start: 2024-01-29 — End: 2024-02-04

## 2024-01-29 MED ORDER — HEPARIN SODIUM (PORCINE) 5000 UNIT/ML IJ SOLN
5000.0000 [IU] | Freq: Three times a day (TID) | INTRAMUSCULAR | Status: DC
  Administered 2024-01-29 – 2024-02-04 (×19): 5000 [IU] via SUBCUTANEOUS
  Filled 2024-01-29 (×19): qty 1

## 2024-01-29 MED ORDER — ACETAMINOPHEN 650 MG RE SUPP: 650.0000 mg | Freq: Four times a day (QID) | RECTAL | Status: DC | PRN

## 2024-01-29 MED ORDER — PIPERACILLIN-TAZOBACTAM 3.375 G IVPB
3.3750 g | Freq: Three times a day (TID) | INTRAVENOUS | Status: DC
  Administered 2024-01-29 – 2024-01-31 (×7): 3.375 g via INTRAVENOUS
  Filled 2024-01-29 (×7): qty 50

## 2024-01-29 MED ORDER — ALBUTEROL SULFATE HFA 108 (90 BASE) MCG/ACT IN AERS: 1.0000 | INHALATION_SPRAY | RESPIRATORY_TRACT | Status: DC | PRN

## 2024-01-29 MED ORDER — IPRATROPIUM-ALBUTEROL 0.5-2.5 (3) MG/3ML IN SOLN: 3.0000 mL | Freq: Four times a day (QID) | RESPIRATORY_TRACT | Status: DC | PRN

## 2024-01-29 MED ORDER — ATORVASTATIN CALCIUM 40 MG PO TABS
80.0000 mg | ORAL_TABLET | Freq: Every day | ORAL | Status: DC
  Administered 2024-01-29 – 2024-02-03 (×6): 80 mg via ORAL
  Filled 2024-01-29 (×6): qty 2

## 2024-01-29 MED ORDER — LEVOTHYROXINE SODIUM 50 MCG PO TABS
175.0000 ug | ORAL_TABLET | Freq: Every day | ORAL | Status: DC
  Administered 2024-01-29 – 2024-02-04 (×7): 175 ug via ORAL
  Filled 2024-01-29 (×7): qty 1

## 2024-01-29 MED ORDER — SODIUM CHLORIDE 0.9% FLUSH
3.0000 mL | Freq: Two times a day (BID) | INTRAVENOUS | Status: DC
  Administered 2024-01-29 – 2024-02-04 (×9): 3 mL via INTRAVENOUS

## 2024-01-29 MED ORDER — SODIUM CHLORIDE 0.9% FLUSH
3.0000 mL | INTRAVENOUS | Status: DC | PRN
Start: 2024-01-29 — End: 2024-02-04
  Administered 2024-02-01: 3 mL via INTRAVENOUS

## 2024-01-29 MED ORDER — LISINOPRIL 10 MG PO TABS: 10.0000 mg | ORAL_TABLET | Freq: Every day | ORAL | Status: DC

## 2024-01-29 MED ORDER — SODIUM CHLORIDE 0.9 % IV SOLN: 250.0000 mL | INTRAVENOUS | Status: AC | PRN

## 2024-01-29 MED ORDER — SODIUM CHLORIDE 0.9% FLUSH
3.0000 mL | Freq: Two times a day (BID) | INTRAVENOUS | Status: DC
  Administered 2024-01-29 – 2024-02-04 (×14): 3 mL via INTRAVENOUS

## 2024-01-29 MED ORDER — ONDANSETRON HCL 4 MG/2ML IJ SOLN: 4.0000 mg | Freq: Four times a day (QID) | INTRAMUSCULAR | Status: DC | PRN

## 2024-01-29 MED ORDER — ASPIRIN 81 MG PO TBEC
81.0000 mg | DELAYED_RELEASE_TABLET | Freq: Every day | ORAL | Status: DC
  Administered 2024-01-29 – 2024-02-03 (×6): 81 mg via ORAL
  Filled 2024-01-29 (×7): qty 1

## 2024-01-29 MED ORDER — EZETIMIBE 10 MG PO TABS: 10.0000 mg | ORAL_TABLET | Freq: Every day | ORAL | Status: DC

## 2024-01-29 MED ORDER — METOPROLOL SUCCINATE ER 50 MG PO TB24: 50.0000 mg | ORAL_TABLET | Freq: Every day | ORAL | Status: DC

## 2024-01-29 MED ORDER — LISINOPRIL 10 MG PO TABS
10.0000 mg | ORAL_TABLET | Freq: Every day | ORAL | Status: DC
  Administered 2024-01-29 – 2024-02-04 (×8): 10 mg via ORAL
  Filled 2024-01-29 (×8): qty 1

## 2024-01-29 NOTE — Plan of Care (Signed)

## 2024-01-29 NOTE — Progress Notes (Signed)
 Chaplains received a consult to assist [name] with her advance directives.  Abigail [name] was asleep at time of visit.  I left documents at bedside for her.  If she needs assistance with them, please page us  at [phone].  Please note that after 5:00p on Friday, we are only able to assist with advance directives on emergency basis over the weekend.  If she has other support needs, please page.

## 2024-01-29 NOTE — Evaluation (Signed)
 Physical Therapy Evaluation Patient Details Name: Abigail [name] MRN: [id number] DOB: June 14, 1936 Today's Date: 01/29/2024  History of Present Illness  88 y.o. female admitted with UTI. PMH: lung ca, COPD, CKD, back surgery, HTN, CAD.  Clinical Impression  Pt is mobilizing well at a modified independent level. She ambulated 300' with her 3 wheeled RW and 3L O2 [place], no loss of balance, no dyspnea. She is at baseline with mobility, no further PT indicated. Will sign off. Mobility team to follow.         If plan is discharge home, recommend the following: Assist for transportation;Help with stairs or ramp for entrance   Can travel by private vehicle        Equipment Recommendations None recommended by PT  Recommendations for Other Services       Functional Status Assessment Patient has not had a recent decline in their functional status     Precautions / Restrictions Precautions Precautions: None Recall of Precautions/Restrictions: Intact Restrictions Weight Bearing Restrictions Per Provider Order: No      Mobility  Bed Mobility Overal bed mobility: Modified Independent             General bed mobility comments: HOB up    Transfers Overall transfer level: Needs assistance Equipment used:  (3 wheeled RW) Transfers: Sit to/from Stand Sit to Stand: Modified independent (Device/Increase time)                Ambulation/Gait Ambulation/Gait assistance: Modified independent (Device/Increase time) Gait Distance (Feet): 300 Feet   Gait Pattern/deviations: Step-through pattern, Decreased stride length, Trunk flexed Gait velocity: WFL     General Gait Details: steady with 3 wheeled RW, no loss of balance, ambulated with 3L O2, no dyspnea  Stairs            Wheelchair Mobility     Tilt Bed    Modified Rankin (Stroke Patients Only)       Balance Overall balance assessment: Modified Independent                                            Pertinent Vitals/Pain Pain Assessment Pain Assessment: No/denies pain    Home Living Family/patient expects to be discharged to:: Private residence Living Arrangements: Children Available Help at Discharge: Family;Available PRN/intermittently Type of Home: House Home Access: Stairs to enter Entrance Stairs-Rails: None Entrance Stairs-Number of Steps: 1   Home Layout: One level Home Equipment: Other (comment);Shower seat;Grab bars - tub/shower;Hand held shower head (3 wheeled RW) Additional Comments: lives with son who works; family has scheduled installation of ramp    Prior Function Prior Level of Function : Independent/Modified Independent             Mobility Comments: walks with 3 wheeled RW, on 3L O2 24/7, denies falls in past 6 months ADLs Comments: doesn't drive; independent with showering using shower seat     Extremity/Trunk Assessment   Upper Extremity Assessment Upper Extremity Assessment: Overall WFL for tasks assessed    Lower Extremity Assessment Lower Extremity Assessment: Overall WFL for tasks assessed    Cervical / Trunk Assessment Cervical / Trunk Assessment: Normal  Communication   Communication Communication: No apparent difficulties    Cognition Arousal: Alert Behavior During Therapy: WFL for tasks assessed/performed   PT - Cognitive impairments: No apparent impairments  Following commands: Intact       Cueing       General Comments      Exercises     Assessment/Plan    PT Assessment Patient does not need any further PT services  PT Problem List         PT Treatment Interventions      PT Goals (Current goals can be found in the Care Plan section)  Acute Rehab PT Goals Patient Stated Goal: going to church PT Goal Formulation: All assessment and education complete, DC therapy    Frequency       Co-evaluation               AM-PAC PT "6 Clicks" Mobility  Outcome Measure  Help needed turning from your back to your side while in a flat bed without using bedrails?: None Help needed moving from lying on your back to sitting on the side of a flat bed without using bedrails?: None Help needed moving to and from a bed to a chair (including a wheelchair)?: None Help needed standing up from a chair using your arms (e.g., wheelchair or bedside chair)?: None Help needed to walk in hospital room?: None Help needed climbing 3-5 steps with a railing? : None 6 Click Score: 24    End of Session Equipment Utilized During Treatment: Gait belt Activity Tolerance: Patient tolerated treatment well Patient left: in bed;with call bell/phone within reach Nurse Communication: Mobility status      Time: 1051-1110 PT Time Calculation (min) (ACUTE ONLY): 19 min   Charges:   PT Evaluation $PT Eval Low Complexity: 1 Low   PT General Charges $$ ACUTE PT VISIT: 1 Visit         Daymon Evans PT 01/29/2024  Acute Rehabilitation Services  Office [phone]

## 2024-01-29 NOTE — Progress Notes (Signed)
 PROGRESS NOTE    Abigail [name]  UEA:[id number] DOB: 03/25/1936 DOA: 01/28/2024 PCP: [name], FNP    Chief Complaint  Patient presents with   Urinary Tract Infection    Brief Narrative:  Patient is a 88 year old female history of hypertension, hyperlipidemia, CAD, carotid disease, history of non-small cell lung cancer and adenocarcinoma status postchemotherapy and immunotherapy, CKD stage IIIb, hypothyroidism, COPD and chronic hypoxic respiratory failure on 3 L O2 presented to the ED with complaints of UTI.  Patient noted to have received 2 rounds of oral antibiotics however still with ongoing symptoms.  Patient noted to have had recent urine culture grew Pseudomonas.  Patient placed on oral ciprofloxacin  however noted to have failed outpatient treatment and patient admitted for IV antibiotics.   Assessment & Plan:   Principal Problem:   Acute cystitis Active Problems:   Hyperlipidemia   Hypothyroidism   CKD (chronic kidney disease), stage III (HCC)   Essential hypertension   H/O: lung cancer   Chronic hypoxic respiratory failure (HCC)   History of CAD (coronary artery disease)   History of COPD   Chronic weakness of both lower extremities   QT prolongation  #1 acute cystitis due to Pseudomonas aeruginosa -Patient noted to have presented to the ED as referred from PCP. - Urine cultures from 01/19/2024 with Pseudomonas aeruginosa. - Patient stated completed a 10-day course of oral ciprofloxacin  and prior to that an oral antibiotic however with no significant improvement with her urinary tract symptoms. - Repeat urinalysis done with large leukocytes, nitrite negative, rare bacteria, WBC > 50. - Urine culture is pending. - Continue IV Zosyn.  2.  Hypertension -Continue to hold Toprol -XL in the setting of bradycardia. - Continue lisinopril .  3.  Hyperlipidemia -Continue statin.  4.  Hypothyroidism -Synthroid .  5.  CKD stage IIIb -Stable.  6.  History of lung cancer  status post chemoimmunotherapy -Outpatient follow-up with oncology.  7.  History of CAD -Toprol -XL held in the setting of bradycardia. - Continue Lipitor , aspirin .  8.  History of COPD/chronic hypoxic respiratory failure on 3 L O2 at baseline -Stable. - Continue Breztri  twice daily, DuoNebs as needed. - Continue home oxygen .  9.  Chronic weakness of bilateral lower extremities -Per admitting physician patient with a chronic weakness of bilateral lower extremities and sometimes feels like legs with sleeping. - PT OT.  10.  Prolonged QTc -It is noted that EKG showed normal sinus rhythm with prolonged QTc of . - Repeat EKG. - Keep potassium approximately 4, magnesium  approximately 2.   DVT prophylaxis: Heparin  Code Status: Full Family Communication: Updated patient.  No family at bedside. Disposition: Likely home when clinically improved.  Status is: Inpatient Remains inpatient appropriate because: Severity of illness   Consultants:  None  Procedures:  CXR 01/28/2024  Antimicrobials:  Anti-infectives (From admission, onward)    Start     Dose/Rate Route Frequency Ordered Stop   01/29/24 0600  piperacillin-tazobactam (ZOSYN) IVPB 3.375 g        3.375 g 12.5 mL/hr over 240 Minutes Intravenous Every 8 hours 01/29/24 0035     01/28/24 2315  piperacillin-tazobactam (ZOSYN) IVPB 3.375 g        3.375 g 100 mL/hr over 30 Minutes Intravenous Every 8 hours 01/28/24 2308 01/29/24 0113         Subjective: Patient sleeping but easily arousable.  Denies any chest pain or shortness of breath.  No significant abdominal pain.  Still with urinary frequency.  Overall feeling better than  she did on admission.  Objective: Vitals:   01/29/24 0826 01/29/24 0914 01/29/24 1049 01/29/24 1540  BP:  (!) 141/59 (!) 150/58 (!) 115/44  Pulse:  61 63 64  Resp:   17 14  Temp:   (!) 97.5 F (36.4 C) 97.8 F (36.6 C)  TempSrc:   Oral Oral  SpO2: 99% 100% 96% 99%  Weight:      Height:        No intake or output data in the 24 hours ending 01/29/24 1722 Filed Weights   01/28/24 2019  Weight: 71.7 kg    Examination:  General exam: Appears calm and comfortable  Respiratory system: Some diffuse scattered crackles.  No wheezing.  Fair air movement.  Speaking in full sentences.  Cardiovascular system: S1 & S2 heard, RRR. No JVD, murmurs, rubs, gallops or clicks. No pedal edema. Gastrointestinal system: Abdomen is nondistended, soft and nontender. No organomegaly or masses felt. Normal bowel sounds heard. Central nervous system: Alert and oriented. No focal neurological deficits. Extremities: Symmetric 5 x 5 power. Skin: No rashes, lesions or ulcers Psychiatry: Judgement and insight appear normal. Mood & affect appropriate.     Data Reviewed: I have personally reviewed following labs and imaging studies  CBC: Recent Labs  Lab 01/28/24 2153 01/29/24 0405  WBC 6.0 6.5  NEUTROABS 4.6  --   HGB 11.3* 10.5*  HCT 37.0 34.6*  MCV 93.9 96.4  PLT 212 192    Basic Metabolic Panel: Recent Labs  Lab 01/28/24 2153 01/29/24 0405  NA 138 138  K 4.6 3.8  CL 103 105  CO2 27 26  GLUCOSE 109* 156*  BUN 39* 39*  CREATININE 1.28* 1.52*  CALCIUM  9.2 8.9  MG  --  2.2    GFR: Estimated Creatinine Clearance: 27.5 mL/min (A) (by C-G formula based on SCr of 1.52 mg/dL (H)).  Liver Function Tests: Recent Labs  Lab 01/28/24 2153  AST 18  ALT 16  ALKPHOS 102  BILITOT 0.5  PROT 7.8  ALBUMIN 3.8    CBG: No results for input(s): "GLUCAP" in the last 168 hours.   Recent Results (from the past 240 hours)  Urine Culture     Status: Abnormal (Preliminary result)   Collection Time: 01/28/24  8:09 PM   Specimen: Urine, Clean Catch  Result Value Ref Range Status   Specimen Description   Final    URINE, CLEAN CATCH Performed at [facility], [address], [place], [place] [address]    Special Requests   Final    NONE Performed at [facility], [address], [place], [place] [address]    Culture (A)  Final    70,000 COLONIES/mL Hillis Lu NEGATIVE RODS IDENTIFICATION AND SUSCEPTIBILITIES TO FOLLOW Performed at [facility], [address] [place], [place] [address]    Report Status PENDING  Incomplete         Radiology Studies: DG Chest 2 View Result Date: 01/28/2024 CLINICAL DATA:  The EXAM: CHEST - 2 VIEW COMPARISON:  Chest x-ray 10/11/2023 FINDINGS: The heart is enlarged. There central pulmonary vascular congestion and patchy perihilar opacity. There is no pleural effusion or pneumothorax. No acute fractures are seen. IMPRESSION: Cardiomegaly with central pulmonary vascular congestion and patchy perihilar opacity, likely pulmonary edema. Electronically Signed   By: Tyron Gallon M.D.   On: 01/28/2024 21:51        Scheduled Meds:  aspirin  EC  81 mg Oral QHS   atorvastatin   80 mg Oral q1800   budesonide -glycopyrrolate -formoterol   2 puff Inhalation BID   heparin   5,000 Units Subcutaneous Q8H   levothyroxine   175 mcg Oral Q0600   lisinopril   10 mg Oral Daily   sodium chloride  flush  3 mL Intravenous Q12H   sodium chloride  flush  3 mL Intravenous Q12H   Continuous Infusions:  sodium chloride      piperacillin-tazobactam (ZOSYN)  IV 3.375 g (01/29/24 1346)     LOS: 0 days    Time spent: 40 mins    Hilda Lovings, MD [facility]   To contact the attending provider between 7A-7P or the covering provider during after hours 7P-7A, please log into the web site [email or web address] and access using universal [place] password for that web site. If you do not have the password, please call the hospital operator.  01/29/2024, 5:22 PM

## 2024-01-29 NOTE — TOC Initial Note (Signed)
 Transition of Care [facility]) - Initial/Assessment Note    Patient Details  Name: Abigail [name] MRN: [id number] Date of Birth: Jan 18, 1936  [facility]) CM/SW Contact:    Abigail Parish, RN Phone Number: 01/29/2024, 11:33 AM  Clinical Narrative:                 CM spoke with patient in room. Patient states that PTA she lives in a house with son Abigail [name]. Does not drive anymore and use Insurance transport; Verified PCP/insurance;DME - oxygen  w/[employer], has a portable tank here when discharged, rollator, cane, BSC. Also has Meals on Wheels; No HH or SDOH needs;Son Abigail [name] will transport home at discharge. TOC will progression for discharge.  Expected Discharge Plan: Home/Self Care Barriers to Discharge: Continued Medical Work up   Patient Goals and CMS Choice Patient states their goals for this hospitalization and ongoing recovery are:: Home with son CMS Medicare.gov Compare Post Acute Care list provided to::  (NA) Choice offered to / list presented to : NA [place] ownership interest in [facility].provided to:: Parent NA    Expected Discharge Plan and Services In-house Referral: NA Discharge Planning Services: CM Consult Post Acute Care Choice: NA Living arrangements for the past 2 months: Single Family Home                 DME Arranged: N/A DME Agency: NA       HH Arranged: NA HH Agency: NA        Prior Living Arrangements/Services Living arrangements for the past 2 months: Single Family Home Lives with:: Adult Children Patient language and need for interpreter reviewed:: Yes        Need for Family Participation in Patient Care: Yes (Comment)   Current home services: DME, Meals on wheels (Oxygen  w/ [employer];cane, rollator, 3/1) Criminal Activity/Legal Involvement Pertinent to Current Situation/Hospitalization: No - Comment as needed  Activities of Daily Living   ADL Screening (condition at time of admission) Independently performs ADLs?: Yes (appropriate  for developmental age) Is the patient deaf or have difficulty hearing?: No Does the patient have difficulty seeing, even when wearing glasses/contacts?: No Does the patient have difficulty concentrating, remembering, or making decisions?: No  Permission Sought/Granted Permission sought to share information with : Case Manager Permission granted to share information with : Yes, Verbal Permission Granted  Share Information with NAME: Abigail [name]  Permission granted to share info w AGENCY: [employer]  Permission granted to share info w Relationship: son     Emotional Assessment Appearance:: Appears stated age Attitude/Demeanor/Rapport: Engaged Affect (typically observed): Appropriate Orientation: : Oriented to Self, Oriented to Place, Oriented to  Time, Oriented to Situation Alcohol / Substance Use: Not Applicable Psych Involvement: No (comment)  Admission diagnosis:  Acute cystitis [N30.00] Acute cystitis without hematuria [N30.00] Patient Active Problem List   Diagnosis Date Noted   Acute cystitis 01/29/2024   History of CAD (coronary artery disease) 01/29/2024   History of COPD 01/29/2024   Chronic weakness of both lower extremities 01/29/2024   QT prolongation 01/29/2024   COVID-19 vaccination declined 12/22/2023   Chest pain 10/11/2023   Overactive bladder [N32.81] 08/24/2023   Stage 3b chronic kidney disease (HCC) [N18.32] 08/24/2023   Hypertension with renal disease 08/24/2023   Foul smelling urine 06/21/2023   Hematuria 06/21/2023   Closed fracture of left upper limb 06/21/2023   Asymptomatic microscopic hematuria 04/08/2023   Right wrist pain 02/25/2023   Supplemental oxygen  dependent 02/25/2023   Chronic pain of  both knees 02/25/2023   Atherosclerosis of aorta (HCC) 02/25/2023   Hiatal hernia 11/20/2022   Generalized weakness 11/20/2022   Chronic cough 10/22/2022   Nodule of kidney 09/15/2022   Left ureteral stone 09/07/2022   Gross hematuria 09/07/2022   Chronic hypoxic  respiratory failure (HCC) 09/07/2022   Nephrolithiasis 09/07/2022   Staghorn calculus 09/07/2022   Chronic iron  deficiency anemia 04/21/2022   Anemia 02/13/2022   Encounter for antineoplastic immunotherapy 11/20/2021   COPD (chronic obstructive pulmonary disease) (HCC) 08/20/2021   Dysphagia 08/20/2021   H/O: lung cancer 07/31/2021   Malignant neoplasm of unspecified part of unspecified bronchus or lung (HCC) 07/25/2021   Encounter for antineoplastic chemotherapy 07/25/2021   Mediastinal adenopathy 07/15/2021   Coronary artery disease of native artery of native heart with stable angina pectoris (HCC) 04/11/2021   Right bundle branch block 04/11/2021   Newly recognized heart murmur 04/11/2021   Occult blood positive stool 11/09/2020   Chest pain with moderate risk for cardiac etiology 12/01/2019   Urinary urgency 12/01/2019   Anxiety 02/09/2019   Prediabetes 12/29/2018   Essential hypertension 08/27/2018   Vitamin D  deficiency 06/25/2018   Hyperlipidemia 06/25/2018   Hypothyroidism 06/25/2018   CKD (chronic kidney disease), stage III (HCC) 06/25/2018   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 [place] [address] Phone: [phone] Fax: [phone]     Social Drivers of Health (SDOH) Social History: SDOH Screenings   Food Insecurity: No Food Insecurity (01/29/2024)  Housing: Low Risk  (01/29/2024)  Transportation Needs: Unmet Transportation Needs (01/29/2024)  Utilities: Not At Risk (01/29/2024)  Alcohol Screen: Low Risk  (02/25/2023)   Depression (PHQ2-9): Low Risk  (08/24/2023)  Financial Resource Strain: Low Risk  (02/25/2023)  Physical Activity: Insufficiently Active (02/25/2023)  Social Connections: Unknown (01/29/2024)  Stress: No Stress Concern Present (02/25/2023)  Tobacco Use: Medium Risk (01/29/2024)   SDOH Interventions: Transportation Interventions: Intervention Not Indicated, Inpatient TOC   Readmission Risk Interventions     No data to display

## 2024-01-29 NOTE — ED Provider Notes (Signed)
  Physical Exam  BP (!) 182/68 (BP Location: Right Arm)   Pulse 61   Temp 97.7 F (36.5 C) (Oral)   Resp 17   Ht 5\' 6"  (1.676 m)   Wt 71.7 kg   SpO2 100%   BMI 25.50 kg/m   Physical Exam  Procedures  Procedures  ED Course / MDM   Clinical Course as of 01/29/24 0021  Thu Jan 28, 2024  2229 Leukocytes,Ua(!): LARGE [JS]  2229 Bacteria, UA(!): RARE [JS]  Fri Jan 29, 2024  0019 Consult to Dr. Sundil, hospitalist, who is agreeable to admitting this patient to her service. I appreciate her collaboration in the care of this patient.  [RS]    Clinical Course User Index [JS] Soto, Johana, PA-C [RS] Zavien Clubb, Adelle Agent, PA-C   Medical Decision Making Amount and/or Complexity of Data Reviewed Labs: ordered. Decision-making details documented in ED Course. Radiology: ordered.  Risk Prescription drug management.   Care of this patient received in transfer from preceding ED provider Luellen Sages, PA-C at time of shift change.  Please see her associated note for further insight and the patient's ED course.  In brief patient is an 88 year old female who is failed outpatient therapy for urinary tract infection and recently had urine culture positive for Pseudomonas.  Presents with persistent fogginess of thought and urinary frequency.  Attacks of change pending hospitalist consult for admission.  Preceding ED provider did discuss antibiotic choice with pharmacist who recommends Zosyn every 8 hours for a few days inpatient as her urine culture did not reveal sensitivity to any oral agents.  Consult to hospitalist as above. Elajah voiced understanding of her medical evaluation and treatment plan. Each of their questions answered to their expressed satisfaction. She is amenable to plan for admission at this time.  This chart was dictated using voice recognition software, Dragon. Despite the best efforts of this provider to proofread and correct errors, errors may still occur which can change  documentation meaning.    Kae Oram, PA-C 01/29/24 0022    Edson Graces, MD 01/29/24 [id number]

## 2024-01-29 NOTE — H&P (Signed)
 History and Physical    Abigail [name]:[id number] DOB: 24-Jan-1936 DOA: 01/28/2024  PCP: [name], FNP   Patient coming from: Home   Chief Complaint:  Chief Complaint  Patient presents with   Urinary Tract Infection   ED TRIAGE note:    Pt came in for UTI diagnosis. Her doctor stated she would need to get antibiotics for her culture results. Pt was given PO antibiotics but it has not been working.      HPI:  Abigail [name] is a 88 y.o. female with medical history significant of essential hypertension, hyperlipidemia, CAD, carotid disease, h/o non-small cell lung cancer and adenocarcinoma s/p chemotherapy and immunotherapy, CKD stage IIIb, hypothyroidism, COPD and chronic hypoxic respiratory failure on 3 L oxygen  presented to emergency department complaining of urinary tract infection.  Patient reported she has been taking ciprofloxacin  for 3 weeks however stopped taking antibiotic yesterday as no improvement of her UTI symptoms.  She called her primary care nurse who recommended to go to the ED for evaluation as the urine culture grew Pseudomonas.  Patient also reported continued to have foggy memories due to UTI.  Denies any fever, chill and flank pain.  Denies any nausea and vomiting.  Patient reported increased urinary urgency and frequency.  Denies any dysuria and hematuria.  Patient is also reporting bilateral lower extremity chronic weakness and she has been doing physical therapy by herself at home and requesting for hospital PT therapy.   ED Course:  At presentation to ED patient found to have elevated blood pressure 181/70 which has been improved to 140/81.  O2 sat 97% on 2 L oxygen .  Hemodynamically stable. UA showing evidence of UTI and repeat urine culture. CBC unremarkable. CMP showing creatinine 1.2 and GFR 41.  Renal function at baseline.  Otherwise unremarkable CMP. Urine culture from 01/19/2024 showing Pseudomonas aeruginosa pansensitive.  Pharmacy recommended  Zosyn.  In the ED patient has been given Zosyn.  Hospitalist has been consulted for management of acute cystitis in the setting of Pseudomonas aeruginosa need for IV antibiotic.   Significant labs in the ED: Lab Orders         Urine Culture         Urinalysis, Routine w reflex microscopic -Urine, Clean Catch         CBC with Differential         Comprehensive metabolic panel         CBC         Basic metabolic panel       Review of Systems:  Review of Systems  Constitutional:  Negative for chills, fever and weight loss.  Respiratory:  Negative for cough.   Cardiovascular:  Negative for chest pain and palpitations.  Genitourinary:  Positive for frequency and urgency. Negative for dysuria and hematuria.  Neurological:  Negative for dizziness, tingling, tremors, sensory change, speech change, focal weakness, weakness and headaches.  Psychiatric/Behavioral:  The patient does not have insomnia.     Past Medical History:  Diagnosis Date   Anemia    Arthritis    knees, back   Bacteremia 04/08/2012   CAP (community acquired pneumonia) 04/12/2012   Carotid artery occlusion    Chronic kidney disease    stage 3 ckd no nephrologist   Complete uterine prolapse with prolapse of anterior vaginal wall    Complication of anesthesia    hard to wake up per pt   Constipation    Coronary artery disease    Diverticulitis  yrs ago coialitis   Dyspnea    History of blood transfusion    History of radiation therapy    right lung 08/05/2021-09/18/2021  Dr Retta Caster   Hypertension    Hypothyroid    LLQ abdominal pain 04/05/2012   Lung cancer (HCC)    Numbness    in hands at times   Pneumonia    Pre-diabetes    Scoliosis    STEMI (ST elevation myocardial infarction) (HCC) 10/26/2019   DES RCA   Wears dentures    full dentures   Wears glasses    for reading    Past Surgical History:  Procedure Laterality Date   ANTERIOR AND POSTERIOR REPAIR WITH SACROSPINOUS FIXATION N/A  12/20/2020   Procedure: SACROSPINOUS LIGAMENT FIXATION;  Surgeon: Arma Lamp, MD;  Location: [facility];  Service: Gynecology;  Laterality: N/A;  Total time requested for all procedures is 2 hours   BACK SURGERY  1980 age 34   lower   bartholin cyst removal  age 3's   BLADDER SUSPENSION N/A 12/20/2020   Procedure: TRANSVAGINAL TAPE (TVT) PROCEDURE;  Surgeon: Arma Lamp, MD;  Location: [facility];  Service: Gynecology;  Laterality: N/A;   BRONCHIAL BRUSHINGS  07/15/2021   Procedure: BRONCHIAL BRUSHINGS;  Surgeon: Denson Flake, MD;  Location: [facility] ENDOSCOPY;  Service: Pulmonary;;   BRONCHIAL NEEDLE ASPIRATION BIOPSY  07/15/2021   Procedure: BRONCHIAL NEEDLE ASPIRATION BIOPSIES;  Surgeon: Denson Flake, MD;  Location: [facility] ENDOSCOPY;  Service: Pulmonary;;   CORONARY/GRAFT ACUTE MI REVASCULARIZATION N/A 10/26/2019   Procedure: Coronary/Graft Acute MI Revascularization;  Surgeon: Arnoldo Lapping, MD;  Location: [facility] INVASIVE CV LAB;  Service: Cardiovascular;  Laterality: N/A;   CYSTOCELE REPAIR N/A 12/20/2020   Procedure: ANTERIOR AND POSTERIOR REPAIR WITH PERINEORRHAPHY;  Surgeon: Arma Lamp, MD;  Location: [facility];  Service: Gynecology;  Laterality: N/A;   CYSTOSCOPY N/A 12/20/2020   Procedure: CYSTOSCOPY;  Surgeon: Arma Lamp, MD;  Location: [facility];  Service: Gynecology;  Laterality: N/A;   ELBOW SURGERY  1990's   left   ENDOBRONCHIAL ULTRASOUND N/A 07/15/2021   Procedure: ENDOBRONCHIAL ULTRASOUND;  Surgeon: Denson Flake, MD;  Location: [facility] ENDOSCOPY;  Service: Pulmonary;  Laterality: N/A;   HEMOSTASIS CONTROL  07/15/2021   Procedure: HEMOSTASIS CONTROL;  Surgeon: Denson Flake, MD;  Location: [facility] ENDOSCOPY;  Service: Pulmonary;;   LEFT HEART CATH AND CORONARY ANGIOGRAPHY N/A 10/26/2019   Procedure: LEFT HEART CATH AND CORONARY ANGIOGRAPHY;  Surgeon: Arnoldo Lapping, MD;   Location: [facility] INVASIVE CV LAB;  Service: Cardiovascular;  Laterality: N/A;   TONSILLECTOMY       reports that she quit smoking about 37 years ago. Her smoking use included cigarettes. She started smoking about 72 years ago. She has a 52.5 pack-year smoking history. She has never used smokeless tobacco. She reports that she does not drink alcohol and does not use drugs.  Allergies  Allergen Reactions   Codeine Nausea And Vomiting   Norvasc  [Amlodipine ] Rash and Other (See Comments)    rash    Family History  Problem Relation Age of Onset   Hypertension Mother    Colon cancer Neg Hx    Stomach cancer Neg Hx    Esophageal cancer Neg Hx     Prior to Admission medications   Medication Sig Start Date End Date Taking? Authorizing Provider  albuterol  (VENTOLIN  HFA) 108 (90 Base) MCG/ACT inhaler TAKE 2 PUFFS BY MOUTH EVERY  6 HOURS AS NEEDED FOR WHEEZE OR SHORTNESS OF BREATH 12/20/21   Heilingoetter, Cassandra L, PA-C  aspirin  EC 81 MG tablet Take 81 mg by mouth at bedtime.    [provider]  atorvastatin  (LIPITOR ) 80 MG tablet Take 1 tablet (80 mg total) by mouth daily at 6 PM. 10/28/23   Patwardhan, Manish J, MD  BREZTRI  AEROSPHERE 160-9-4.8 MCG/ACT AERO inhaler INHALE 2 PUFFS INTO THE LUNGS IN THE MORNING AND ALSO 2 PUFFS AT BEDTIME 12/08/23   Denson Flake, MD  ciprofloxacin  (CIPRO ) 250 MG tablet Take 250 mg by mouth 2 (two) times daily.    [provider]  estradiol  (ESTRACE ) 0.1 MG/GM vaginal cream Place 0.5g twice a week at opening of vagina Patient not taking: Reported on 12/24/2023 10/13/23   Arma Lamp, MD  ezetimibe  (ZETIA ) 10 MG tablet Take 1 tablet (10 mg total) by mouth daily. 07/31/21   Chandrasekhar, Arneta Beverage A, MD  ferrous sulfate  325 (65 FE) MG EC tablet TAKE 1 TABLET BY MOUTH EVERY DAY WITH BREAKFAST 12/03/23   Heilingoetter, Cassandra L, PA-C  levothyroxine  (SYNTHROID ) 175 MCG tablet TAKE 1 TABLET BY MOUTH EVERY MORNING ON MONDAY - FRIDAY Patient taking  differently: 11/05/23 TAKE 1 TABLET BY MOUTH EVERY MORNING MONDAY - SUNDAY STARTING ON 11/16/23 per [name] FNP 10/12/23   [name], FNP  levothyroxine  (SYNTHROID ) 200 MCG tablet TAKE 1 TABLET EVERY OTHER DAY. ALTERNATE BETWEEN THE 175 MCG AND THE 200 MCG Patient not taking: No sig reported 09/25/23   Heilingoetter, Cassandra L, PA-C  lisinopril  (ZESTRIL ) 10 MG tablet TAKE 1 TABLET BY MOUTH EVERY DAY 03/31/23   [name], FNP  metoprolol  succinate (TOPROL -XL) 50 MG 24 hr tablet Take 1 tablet (50 mg total) by mouth daily. Take with or immediately following a meal. 12/03/23   Patwardhan, Manish J, MD  nitrofurantoin  (MACRODANTIN ) 100 MG capsule Take 100 mg by mouth daily. 10/17/23   [provider]  nitrofurantoin , macrocrystal-monohydrate, (MACROBID ) 100 MG capsule Take 100 mg by mouth daily. Prescriber is Dr. Clarke Crouch, [facility]    [provider]  nitroGLYCERIN  (NITROSTAT ) 0.4 MG SL tablet PLACE 1 TABLET UNDER THE TONGUE EVERY 5 (FIVE) MINUTES X 3 DOSES AS NEEDED FOR CHEST PAIN. 07/28/23   [name], FNP  OXYGEN  Inhale 3 L into the lungs continuous.    [provider]  trospium  (SANCTURA ) 20 MG tablet Take 1 tablet (20 mg total) by mouth 2 (two) times daily. 10/13/23 10/07/24  Arma Lamp, MD     Physical Exam: Vitals:   01/28/24 2019 01/28/24 2154 01/29/24 0011 01/29/24 0110  BP:  (!) 182/68 (!) 140/81 (!) 159/57  Pulse:  61 60 65  Resp:  17 16 15   Temp:  97.7 F (36.5 C) 97.9 F (36.6 C) 97.6 F (36.4 C)  TempSrc:  Oral Oral Oral  SpO2:  100% 97% 98%  Weight: 71.7 kg     Height: 5\' 6"  (1.676 m)       Physical Exam Vitals and nursing note reviewed.  Constitutional:      General: She is not in acute distress.    Appearance: She is not ill-appearing.  HENT:     Mouth/Throat:     Mouth: Mucous membranes are moist.  Eyes:     Pupils: Pupils are equal, round, and reactive to light.  Cardiovascular:     Rate and Rhythm: Normal  rate and regular rhythm.     Pulses: Normal pulses.  Heart sounds: Normal heart sounds.  Pulmonary:     Effort: Pulmonary effort is normal.     Breath sounds: Normal breath sounds.  Abdominal:     General: Bowel sounds are normal.  Musculoskeletal:        General: No swelling, tenderness, deformity or signs of injury. Normal range of motion.     Cervical back: Neck supple.     Right lower leg: No edema.     Left lower leg: No edema.  Skin:    General: Skin is dry.     Capillary Refill: Capillary refill takes less than 2 seconds.  Neurological:     Mental Status: She is alert and oriented to person, place, and time.     Sensory: No sensory deficit.     Motor: No weakness.  Psychiatric:        Mood and Affect: Mood normal.        Thought Content: Thought content normal.        Judgment: Judgment normal.      Labs on Admission: I have personally reviewed following labs and imaging studies  CBC: Recent Labs  Lab 01/28/24 2153  WBC 6.0  NEUTROABS 4.6  HGB 11.3*  HCT 37.0  MCV 93.9  PLT 212   Basic Metabolic Panel: Recent Labs  Lab 01/28/24 2153  NA 138  K 4.6  CL 103  CO2 27  GLUCOSE 109*  BUN 39*  CREATININE 1.28*  CALCIUM  9.2   GFR: Estimated Creatinine Clearance: 32.6 mL/min (A) (by C-G formula based on SCr of 1.28 mg/dL (H)). Liver Function Tests: Recent Labs  Lab 01/28/24 2153  AST 18  ALT 16  ALKPHOS 102  BILITOT 0.5  PROT 7.8  ALBUMIN 3.8   No results for input(s): "LIPASE", "AMYLASE" in the last 168 hours. No results for input(s): "AMMONIA" in the last 168 hours. Coagulation Profile: No results for input(s): "INR", "PROTIME" in the last 168 hours. Cardiac Enzymes: No results for input(s): "CKTOTAL", "CKMB", "CKMBINDEX", "TROPONINI", "TROPONINIHS" in the last 168 hours. BNP (last 3 results) Recent Labs    10/11/23 0655  BNP 261.1*   HbA1C: No results for input(s): "HGBA1C" in the last 72 hours. CBG: No results for input(s):  "GLUCAP" in the last 168 hours. Lipid Profile: No results for input(s): "CHOL", "HDL", "LDLCALC", "TRIG", "CHOLHDL", "LDLDIRECT" in the last 72 hours. Thyroid  Function Tests: No results for input(s): "TSH", "T4TOTAL", "FREET4", "T3FREE", "THYROIDAB" in the last 72 hours. Anemia Panel: No results for input(s): "VITAMINB12", "FOLATE", "FERRITIN", "TIBC", "IRON ", "RETICCTPCT" in the last 72 hours. Urine analysis:    Component Value Date/Time   COLORURINE YELLOW 01/28/2024 2008   APPEARANCEUR HAZY (A) 01/28/2024 2008   LABSPEC 1.011 01/28/2024 2008   PHURINE 6.0 01/28/2024 2008   GLUCOSEU NEGATIVE 01/28/2024 2008   HGBUR NEGATIVE 01/28/2024 2008   BILIRUBINUR NEGATIVE 01/28/2024 2008   BILIRUBINUR negative 10/13/2023 1234   BILIRUBINUR Negative 04/07/2023 1226   KETONESUR NEGATIVE 01/28/2024 2008   PROTEINUR 30 (A) 01/28/2024 2008   UROBILINOGEN 0.2 10/13/2023 1234   UROBILINOGEN 0.2 04/05/2012 1606   NITRITE NEGATIVE 01/28/2024 2008   LEUKOCYTESUR LARGE (A) 01/28/2024 2008    Radiological Exams on Admission: I have personally reviewed images DG Chest 2 View Result Date: 01/28/2024 CLINICAL DATA:  The EXAM: CHEST - 2 VIEW COMPARISON:  Chest x-ray 10/11/2023 FINDINGS: The heart is enlarged. There central pulmonary vascular congestion and patchy perihilar opacity. There is no pleural effusion or pneumothorax. No acute fractures are  seen. IMPRESSION: Cardiomegaly with central pulmonary vascular congestion and patchy perihilar opacity, likely pulmonary edema. Electronically Signed   By: Tyron Gallon M.D.   On: 01/28/2024 21:51     EKG: Normal sinus rhythm heart rate 70s 3, right axis deviation and prolonged QTc 504 ms.  Assessment/Plan: Principal Problem:   Acute cystitis Active Problems:   Hyperlipidemia   Hypothyroidism   CKD (chronic kidney disease), stage III (HCC)   Essential hypertension   H/O: lung cancer   Chronic hypoxic respiratory failure (HCC)   History of CAD  (coronary artery disease)   History of COPD   Chronic weakness of both lower extremities   QT prolongation    Assessment and Plan: Acute cystitis due to Pseudomonas aeruginosa -Patient presented emergency department as referred from the PCP.  Urine culture from 5/27 showed Pseudomonas aeruginosa.  Patient has been taking ciprofloxacin  for 3 weeks without any improvement of UTI symptoms. - At presentation to ED patient was borderline hypertensive otherwise hemodynamically stable. - CBC and CMP unremarkable.  Patient is afebrile.  UA showing evidence of UTI and pending repeat urine culture. - Pharmacy recommended IV Zosyn every 8 hours. Admitting for need for IV antibiotic.   Essential hypertension -Continue lisinopril . -Holding Toprol -XL in the setting of bradycardia.  Hyperlipidemia -Continue Lipitor   Hypothyroidism -Levothyroxine  175 mcg daily  CKD stage IIIb -Renal function at baseline.  Continue to monitor and have her nephrotoxic agent.  History of lung cancer status post chemoimmunotherapy - Patient follows outpatient oncology.  History of CAD -Holding Toprol -XL in the setting of bradycardia.  Continue Lipitor  and aspirin .  History of COPD Chronic hypoxic respiratory failure 3 L oxygen  at baseline Stable.  Continue Breztri  twice daily and DuoNeb as needed. Continue supplemental oxygen   Chronic weakness of the bilateral lower extremities  -Patient reported she has chronic weakness of the bilateral lower extremity and sometimes feels like that both legs are sleeping.  Doing home physical therapy by herself however requesting for hospital PT therapy evaluation.  Prolonged QTc - EKG showing normal sinus rhythm and prolonged QTc 504 ms. Need to avoid further QTc prolonging medication.  DVT prophylaxis:  SQ Heparin  Code Status:  Full Code Diet: Heart healthy diet Family Communication: Currently no family member at bedside. Disposition Plan: Continue IV  Zosyn. Consults: Physical therapy Admission status:   Inpatient, Med-Surg  Severity of Illness: The appropriate patient status for this patient is INPATIENT. Inpatient status is judged to be reasonable and necessary in order to provide the required intensity of service to ensure the patient's safety. The patient's presenting symptoms, physical exam findings, and initial radiographic and laboratory data in the context of their chronic comorbidities is felt to place them at high risk for further clinical deterioration. Furthermore, it is not anticipated that the patient will be medically stable for discharge from the hospital within 2 midnights of admission.   * I certify that at the point of admission it is my clinical judgment that the patient will require inpatient hospital care spanning beyond 2 midnights from the point of admission due to high intensity of service, high risk for further deterioration and high frequency of surveillance required.Aaron Aas    Yaminah Clayborn, MD [facility]  How to contact the [facility] Attending or Consulting provider 7A - 7P or covering provider during after hours 7P -7A, for this patient.  Check the care team in [facility] and look for a) attending/consulting [facility] provider listed and b) the [facility] team listed Log into [email or web address] and  use [facility] universal password to access. If you do not have the password, please contact the hospital operator. Locate the [facility] provider you are looking for under [facility] and page to a number that you can be directly reached. If you still have difficulty reaching the provider, please page the [facility] (Director on Call) for the Hospitalists listed on amion for assistance.  01/29/2024, 1:50 AM

## 2024-01-30 ENCOUNTER — Inpatient Hospital Stay (HOSPITAL_COMMUNITY)

## 2024-01-30 DIAGNOSIS — R9431 Abnormal electrocardiogram [ECG] [EKG]: Secondary | ICD-10-CM | POA: Diagnosis not present

## 2024-01-30 DIAGNOSIS — N1832 Chronic kidney disease, stage 3b: Secondary | ICD-10-CM

## 2024-01-30 DIAGNOSIS — R29898 Other symptoms and signs involving the musculoskeletal system: Secondary | ICD-10-CM | POA: Diagnosis not present

## 2024-01-30 DIAGNOSIS — J9611 Chronic respiratory failure with hypoxia: Secondary | ICD-10-CM

## 2024-01-30 DIAGNOSIS — E782 Mixed hyperlipidemia: Secondary | ICD-10-CM

## 2024-01-30 DIAGNOSIS — I10 Essential (primary) hypertension: Secondary | ICD-10-CM

## 2024-01-30 DIAGNOSIS — Z8709 Personal history of other diseases of the respiratory system: Secondary | ICD-10-CM

## 2024-01-30 DIAGNOSIS — E039 Hypothyroidism, unspecified: Secondary | ICD-10-CM

## 2024-01-30 DIAGNOSIS — Z8679 Personal history of other diseases of the circulatory system: Secondary | ICD-10-CM

## 2024-01-30 DIAGNOSIS — N3000 Acute cystitis without hematuria: Secondary | ICD-10-CM | POA: Diagnosis not present

## 2024-01-30 LAB — CBC
HCT: 33.4 % — ABNORMAL LOW (ref 36.0–46.0)
Hemoglobin: 10.1 g/dL — ABNORMAL LOW (ref 12.0–15.0)
MCH: 28.8 pg (ref 26.0–34.0)
MCHC: 30.2 g/dL (ref 30.0–36.0)
MCV: 95.2 fL (ref 80.0–100.0)
Platelets: 186 10*3/uL (ref 150–400)
RBC: 3.51 MIL/uL — ABNORMAL LOW (ref 3.87–5.11)
RDW: 14.6 % (ref 11.5–15.5)
WBC: 5.5 10*3/uL (ref 4.0–10.5)
nRBC: 0 % (ref 0.0–0.2)

## 2024-01-30 LAB — URINE CULTURE: Culture: 70000 — AB

## 2024-01-30 LAB — BASIC METABOLIC PANEL WITH GFR
Anion gap: 7 (ref 5–15)
BUN: 45 mg/dL — ABNORMAL HIGH (ref 8–23)
CO2: 26 mmol/L (ref 22–32)
Calcium: 8.8 mg/dL — ABNORMAL LOW (ref 8.9–10.3)
Chloride: 103 mmol/L (ref 98–111)
Creatinine, Ser: 1.59 mg/dL — ABNORMAL HIGH (ref 0.44–1.00)
GFR, Estimated: 32 mL/min — ABNORMAL LOW (ref >60)
Glucose, Bld: 114 mg/dL — ABNORMAL HIGH (ref 70–99)
Potassium: 4.6 mmol/L (ref 3.5–5.1)
Sodium: 136 mmol/L (ref 135–145)

## 2024-01-30 MED ORDER — METHYLPREDNISOLONE SODIUM SUCC 40 MG IJ SOLR
40.0000 mg | Freq: Once | INTRAMUSCULAR | Status: AC
  Administered 2024-01-30: 40 mg via INTRAVENOUS
  Filled 2024-01-30: qty 1

## 2024-01-30 NOTE — Progress Notes (Signed)
 PROGRESS NOTE    Abigail [name]  WUJ:[id number] DOB: [birth year] DOA: 01/28/2024 PCP: [name], FNP    Chief Complaint  Patient presents with   Urinary Tract Infection    Brief Narrative:  Patient is a 88 year old female history of hypertension, hyperlipidemia, CAD, carotid disease, history of non-small cell lung cancer and adenocarcinoma status postchemotherapy and immunotherapy, CKD stage IIIb, hypothyroidism, COPD and chronic hypoxic respiratory failure on 3 L O2 presented to the ED with complaints of UTI.  Patient noted to have received 2 rounds of oral antibiotics however still with ongoing symptoms.  Patient noted to have had recent urine culture grew Pseudomonas.  Patient placed on oral ciprofloxacin  however noted to have failed outpatient treatment and patient admitted for IV antibiotics.   Assessment & Plan:   Principal Problem:   Acute cystitis Active Problems:   Hyperlipidemia   Hypothyroidism   CKD (chronic kidney disease), stage III (HCC)   Essential hypertension   H/O: lung cancer   Chronic hypoxic respiratory failure (HCC)   History of CAD (coronary artery disease)   History of COPD   Chronic weakness of both lower extremities   QT prolongation  #1 acute cystitis due to Pseudomonas aeruginosa -Patient noted to have presented to the ED as referred from PCP. - Urine cultures from 01/19/2024 with Pseudomonas aeruginosa. - Patient stated completed a 10-day course of oral ciprofloxacin  and prior to that an oral antibiotic however with no significant improvement with her urinary tract symptoms. - Repeat urinalysis done with large leukocytes, nitrite negative, rare bacteria, WBC > 50. - Urine culture with Pseudomonas aeruginosa resistant to ciprofloxacin .  - Continue IV Zosyn  and will need to complete a 7-day course of treatment. - Case discussed with ID (Dr Levern Reader) who recommended CT renal stone protocol to rule out nephrolithiasis as nidus of infection and  recommending a 7-day course of IV antibiotic treatment. -CT renal stone protocol with left urothelial thickening suggestive of infection.  Nonobstructive 1.2 cm left nephrolithiasis.  Partially visualized moderate to large volume hiatal hernia.  Colonic diverticulosis with no acute diverticulitis. - Will discuss with patient in terms of home support to see if home IV antibiotics is feasible.  2.  Hypertension -Continue to hold Toprol -XL in the setting of bradycardia. - Lisinopril .  3.  Hyperlipidemia -Continue statin.  4.  Hypothyroidism - Continue Synthroid .  5.  CKD stage IIIb -Stable.  6.  History of lung cancer status post chemoimmunotherapy -Outpatient follow-up with oncology.  7.  History of CAD -Toprol -XL held in the setting of bradycardia. -Heart rate improving. -May consider resuming Toprol -XL at half home dose if heart rate continues to improve. - Continue Lipitor , aspirin .  8.  History of COPD/chronic hypoxic respiratory failure on 3 L O2 at baseline -Stable. - Continue Breztri  twice daily, DuoNebs as needed. - Continue home oxygen .  9.  Chronic weakness of bilateral lower extremities -Per admitting physician patient with a chronic weakness of bilateral lower extremities and sometimes feels like legs with sleeping. - Patient assessed by PT/OT.    10.  Prolonged QTc -It is noted that EKG showed normal sinus rhythm with prolonged QTc of . - Repeat EKG. - Keep potassium approximately 4, magnesium  approximately 2.   DVT prophylaxis: Heparin  Code Status: Full Family Communication: Updated patient.  No family at bedside. Disposition: Likely home once antibiotic course is completed unless patient has adequate support at home for home IV antibiotics.    Status is: Inpatient Remains inpatient appropriate because: Severity of  illness   Consultants:  Curbside ID: Dr. Levern Reader  Procedures:  CXR 01/28/2024 CT renal stone protocol 01/30/2024  Antimicrobials:   Anti-infectives (From admission, onward)    Start     Dose/Rate Route Frequency Ordered Stop   01/29/24 0600  piperacillin -tazobactam (ZOSYN ) IVPB 3.375 g        3.375 g 12.5 mL/hr over 240 Minutes Intravenous Every 8 hours 01/29/24 0035     01/28/24 2315  piperacillin -tazobactam (ZOSYN ) IVPB 3.375 g        3.375 g 100 mL/hr over 30 Minutes Intravenous Every 8 hours 01/28/24 2308 01/29/24 0113         Subjective: Patient sitting up at the side of the bed.  Denies any chest pain or any change in chronic shortness of breath.  Denies any abdominal pain.  Overall feels well.   Objective: Vitals:   01/30/24 0439 01/30/24 0543 01/30/24 0944 01/30/24 1244  BP: (!) 140/57   (!) 142/65  Pulse: 63   76  Resp: 16     Temp: 97.9 F (36.6 C)   97.6 F (36.4 C)  TempSrc: Oral     SpO2: 93% 97%  100%  Weight:   71.6 kg   Height:        Intake/Output Summary (Last 24 hours) at 01/30/2024 1526 Last data filed at 01/30/2024 0930 Gross per 24 hour  Intake 290 ml  Output --  Net 290 ml   Filed Weights   01/28/24 2019 01/30/24 0944  Weight: 71.7 kg 71.6 kg    Examination:  General exam: Appears calm and comfortable  Respiratory system: Minimal expiratory wheezing.  No crackles.  Fair air movement.  Speaking in full sentences.  Cardiovascular system: Regular rate rhythm no murmurs rubs or gallops.  No JVD.  No pitting lower extremity edema. Gastrointestinal system: Abdomen is soft, nontender, nondistended, positive bowel sounds.  No rebound.  No guarding.  Central nervous system: Alert and oriented. No focal neurological deficits. Extremities: Symmetric 5 x 5 power. Skin: No rashes, lesions or ulcers Psychiatry: Judgement and insight appear normal. Mood & affect appropriate.     Data Reviewed: I have personally reviewed following labs and imaging studies  CBC: Recent Labs  Lab 01/28/24 2153 01/29/24 0405 01/30/24 0434  WBC 6.0 6.5 5.5  NEUTROABS 4.6  --   --   HGB 11.3*  10.5* 10.1*  HCT 37.0 34.6* 33.4*  MCV 93.9 96.4 95.2  PLT 212 192 186    Basic Metabolic Panel: Recent Labs  Lab 01/28/24 2153 01/29/24 0405 01/30/24 0434  NA 138 138 136  K 4.6 3.8 4.6  CL 103 105 103  CO2 27 26 26   GLUCOSE 109* 156* 114*  BUN 39* 39* 45*  CREATININE 1.28* 1.52* 1.59*  CALCIUM  9.2 8.9 8.8*  MG  --  2.2  --     GFR: Estimated Creatinine Clearance: 26.2 mL/min (A) (by C-G formula based on SCr of 1.59 mg/dL (H)).  Liver Function Tests: Recent Labs  Lab 01/28/24 2153  AST 18  ALT 16  ALKPHOS 102  BILITOT 0.5  PROT 7.8  ALBUMIN 3.8    CBG: No results for input(s): "GLUCAP" in the last 168 hours.   Recent Results (from the past 240 hours)  Urine Culture     Status: Abnormal   Collection Time: 01/28/24  8:09 PM   Specimen: Urine, Clean Catch  Result Value Ref Range Status   Specimen Description   Final    URINE, CLEAN  CATCH Performed at [facility], [address], [place], [place] [address]    Special Requests   Final    NONE Performed at [facility], [address] [place], [place] [address]    Culture 70,000 COLONIES/mL PSEUDOMONAS AERUGINOSA (A)  Final   Report Status 01/30/2024 FINAL  Final   Organism ID, Bacteria PSEUDOMONAS AERUGINOSA (A)  Final      Susceptibility   Pseudomonas aeruginosa - MIC*    CEFTAZIDIME 4 SENSITIVE Sensitive     CIPROFLOXACIN  2 RESISTANT Resistant     GENTAMICIN 8 INTERMEDIATE Intermediate     IMIPENEM 2 SENSITIVE Sensitive     PIP/TAZO 8 SENSITIVE Sensitive ug/mL    * 70,000 COLONIES/mL PSEUDOMONAS AERUGINOSA         Radiology Studies: CT RENAL STONE STUDY Result Date: 01/30/2024 CLINICAL DATA:  recurrent UTI EXAM: CT ABDOMEN AND PELVIS WITHOUT CONTRAST TECHNIQUE: Multidetector CT imaging of the abdomen and pelvis was performed following the standard protocol without IV contrast. RADIATION DOSE REDUCTION: This exam was performed according to the departmental  dose-optimization program which includes automated exposure control, adjustment of the mA and/or kV according to patient size and/or use of iterative reconstruction technique. COMPARISON:  MRI abdomen 09/27/2022, CT abdomen pelvis 10/29/2023 FINDINGS: Lower chest: Partially visualized moderate to large hiatal hernia. Severe atherosclerotic plaque. Coronary artery calcification. Hepatobiliary: No focal liver abnormality. No gallstones, gallbladder wall thickening, or pericholecystic fluid. No biliary dilatation. Pancreas: Diffusely atrophic. No focal lesion. Otherwise normal pancreatic contour. No surrounding inflammatory changes. No main pancreatic ductal dilatation. Spleen: Normal in size without focal abnormality. Adrenals/Urinary Tract: No adrenal nodule bilaterally. No hydronephrosis. 1.2 cm left nephrolithiasis. Urothelial thickening on the left. No right nephrolithiasis. Fluid density lesions suggestive of simple renal cysts. Simple renal cysts, in the absence of clinically indicated signs/symptoms, require no independent follow-up. Subcentimeter hyperdensity lesions are too small to characterize-no further follow-up indicated, likely proteinaceous or hemorrhagic cyst. No ureterolithiasis or hydroureter. The urinary bladder is unremarkable. Stomach/Bowel: Stomach is within normal limits. No evidence of bowel wall thickening or dilatation. Colonic diverticulosis appendix appears normal. Vascular/Lymphatic: No abdominal aorta or iliac aneurysm. Severe atherosclerotic plaque of the aorta and its branches. No abdominal, pelvic, or inguinal lymphadenopathy. Reproductive: Uterus and bilateral adnexa are unremarkable. Other: No intraperitoneal free fluid. No intraperitoneal free gas. No organized fluid collection. Musculoskeletal: Tiny fat containing umbilical hernia. No suspicious lytic or blastic osseous lesions. No acute displaced fracture. Multilevel severe degenerative changes of the spine. IMPRESSION: 1. Left  urothelial thickening suggestive of infection. Limited evaluation on this noncontrast study. Correlate with urinalysis. 2. Nonobstructive 1.2 cm left nephrolithiasis. 3. Partially visualized moderate to large volume hiatal hernia. 4. Colonic diverticulosis with no acute diverticulitis. Electronically Signed   By: Morgane  Naveau M.D.   On: 01/30/2024 10:16   DG Chest 2 View Result Date: 01/28/2024 CLINICAL DATA:  The EXAM: CHEST - 2 VIEW COMPARISON:  Chest x-ray 10/11/2023 FINDINGS: The heart is enlarged. There central pulmonary vascular congestion and patchy perihilar opacity. There is no pleural effusion or pneumothorax. No acute fractures are seen. IMPRESSION: Cardiomegaly with central pulmonary vascular congestion and patchy perihilar opacity, likely pulmonary edema. Electronically Signed   By: Tyron Gallon M.D.   On: 01/28/2024 21:51        Scheduled Meds:  aspirin  EC  81 mg Oral QHS   atorvastatin   80 mg Oral q1800   budesonide -glycopyrrolate -formoterol   2 puff Inhalation BID   heparin   5,000 Units Subcutaneous  Q8H   levothyroxine   175 mcg Oral Q0600   lisinopril   10 mg Oral Daily   sodium chloride  flush  3 mL Intravenous Q12H   sodium chloride  flush  3 mL Intravenous Q12H   Continuous Infusions:  piperacillin -tazobactam (ZOSYN )  IV 3.375 g (01/30/24 1432)     LOS: 1 day    Time spent: 40 mins    Hilda Lovings, MD [facility]   To contact the attending provider between 7A-7P or the covering provider during after hours 7P-7A, please log into the web site [email or web address] and access using universal [place] password for that web site. If you do not have the password, please call the hospital operator.  01/30/2024, 3:26 PM

## 2024-01-30 NOTE — Plan of Care (Signed)

## 2024-01-31 DIAGNOSIS — J9611 Chronic respiratory failure with hypoxia: Secondary | ICD-10-CM | POA: Diagnosis not present

## 2024-01-31 DIAGNOSIS — R9431 Abnormal electrocardiogram [ECG] [EKG]: Secondary | ICD-10-CM | POA: Diagnosis not present

## 2024-01-31 DIAGNOSIS — B965 Pseudomonas (aeruginosa) (mallei) (pseudomallei) as the cause of diseases classified elsewhere: Secondary | ICD-10-CM

## 2024-01-31 DIAGNOSIS — R29898 Other symptoms and signs involving the musculoskeletal system: Secondary | ICD-10-CM | POA: Diagnosis not present

## 2024-01-31 DIAGNOSIS — N3000 Acute cystitis without hematuria: Secondary | ICD-10-CM

## 2024-01-31 LAB — CREATININE, URINE, RANDOM: Creatinine, Urine: 55 mg/dL

## 2024-01-31 LAB — CBC
HCT: 33.3 % — ABNORMAL LOW (ref 36.0–46.0)
Hemoglobin: 10.3 g/dL — ABNORMAL LOW (ref 12.0–15.0)
MCH: 29 pg (ref 26.0–34.0)
MCHC: 30.9 g/dL (ref 30.0–36.0)
MCV: 93.8 fL (ref 80.0–100.0)
Platelets: 205 10*3/uL (ref 150–400)
RBC: 3.55 MIL/uL — ABNORMAL LOW (ref 3.87–5.11)
RDW: 14.3 % (ref 11.5–15.5)
WBC: 6.6 10*3/uL (ref 4.0–10.5)
nRBC: 0 % (ref 0.0–0.2)

## 2024-01-31 LAB — RENAL FUNCTION PANEL
Albumin: 3.5 g/dL (ref 3.5–5.0)
Anion gap: 11 (ref 5–15)
BUN: 51 mg/dL — ABNORMAL HIGH (ref 8–23)
CO2: 23 mmol/L (ref 22–32)
Calcium: 9.4 mg/dL (ref 8.9–10.3)
Chloride: 101 mmol/L (ref 98–111)
Creatinine, Ser: 1.74 mg/dL — ABNORMAL HIGH (ref 0.44–1.00)
GFR, Estimated: 28 mL/min — ABNORMAL LOW (ref >60)
Glucose, Bld: 182 mg/dL — ABNORMAL HIGH (ref 70–99)
Phosphorus: 2.8 mg/dL (ref 2.5–4.6)
Potassium: 4.8 mmol/L (ref 3.5–5.1)
Sodium: 135 mmol/L (ref 135–145)

## 2024-01-31 LAB — SODIUM, URINE, RANDOM: Sodium, Ur: 35 mmol/L

## 2024-01-31 LAB — MAGNESIUM: Magnesium: 2.1 mg/dL (ref 1.7–2.4)

## 2024-01-31 MED ORDER — SODIUM CHLORIDE 0.9 % IV SOLN
1.0000 g | INTRAVENOUS | Status: DC
  Administered 2024-01-31 – 2024-02-02 (×3): 1 g via INTRAVENOUS
  Filled 2024-01-31 (×4): qty 1

## 2024-01-31 MED ORDER — SODIUM CHLORIDE 0.9 % IV SOLN: INTRAVENOUS | Status: DC

## 2024-01-31 MED ORDER — METOPROLOL SUCCINATE ER 25 MG PO TB24
25.0000 mg | ORAL_TABLET | Freq: Every day | ORAL | Status: DC
  Administered 2024-01-31 – 2024-02-04 (×5): 25 mg via ORAL
  Filled 2024-01-31 (×5): qty 1

## 2024-01-31 NOTE — Progress Notes (Signed)
 Pharmacy Antibiotic Note  Abigail [name] is a 88 y.o. female admitted on 01/28/2024 with UTI.  Pharmacy has been consulted for ceftazidime dosing.  Plan: Ceftazidime 1 gm IV q24   Height: 5\' 6"  (167.6 cm) Weight: 71.6 kg (157 lb 14.4 oz) IBW/kg (Calculated) : 59.3  Temp (24hrs), Avg:97.6 F (36.4 C), Min:97.5 F (36.4 C), Max:97.6 F (36.4 C)  Recent Labs  Lab 01/28/24 2153 01/29/24 0405 01/30/24 0434 01/31/24 0418  WBC 6.0 6.5 5.5 6.6  CREATININE 1.28* 1.52* 1.59* 1.74*    Estimated Creatinine Clearance: 24 mL/min (A) (by C-G formula based on SCr of 1.74 mg/dL (H)).    Allergies  Allergen Reactions   Codeine Nausea And Vomiting   Norvasc  [Amlodipine ] Rash and Other (See Comments)    rash    Antimicrobials this admission: 6/6 Zosyn > 6/8 6/8 ceftazidime>> Dose adjustments this admission:  Microbiology results: 6/5 UCx: 70 K Pseudomonas aeruginosa: R to cipro /intermediate to gent; sensitive to ceftaz/imi/pip/tazo  Thank you for allowing pharmacy to be a part of this patient's care.  Rubie Corona, Pharm.D Use secure chat for questions 01/31/2024 9:43 AM

## 2024-01-31 NOTE — Plan of Care (Signed)

## 2024-01-31 NOTE — Progress Notes (Signed)
 PROGRESS NOTE    Abigail [name]  UXL:[id number] DOB: 1936/05/28 DOA: 01/28/2024 PCP: [name], FNP    Chief Complaint  Patient presents with   Urinary Tract Infection    Brief Narrative:  Patient is a 88 year old female history of hypertension, hyperlipidemia, CAD, carotid disease, history of non-small cell lung cancer and adenocarcinoma status postchemotherapy and immunotherapy, CKD stage IIIb, hypothyroidism, COPD and chronic hypoxic respiratory failure on 3 L O2 presented to the ED with complaints of UTI.  Patient noted to have received 2 rounds of oral antibiotics however still with ongoing symptoms.  Patient noted to have had recent urine culture grew Pseudomonas.  Patient placed on oral ciprofloxacin  however noted to have failed outpatient treatment and patient admitted for IV antibiotics.   Assessment & Plan:   Principal Problem:   Acute cystitis Active Problems:   Hyperlipidemia   Hypothyroidism   CKD (chronic kidney disease), stage III (HCC)   Essential hypertension   H/O: lung cancer   Chronic hypoxic respiratory failure (HCC)   History of CAD (coronary artery disease)   History of COPD   Chronic weakness of both lower extremities   QT prolongation  #1 acute cystitis due to Pseudomonas aeruginosa -Patient noted to have presented to the ED as referred from PCP. - Urine cultures from 01/19/2024 with Pseudomonas aeruginosa. - Patient stated completed a 10-day course of oral ciprofloxacin  and prior to that an oral antibiotic however with no significant improvement with her urinary tract symptoms. - Repeat urinalysis done with large leukocytes, nitrite negative, rare bacteria, WBC > 50. - Urine culture with Pseudomonas aeruginosa resistant to ciprofloxacin .  - Continue IV Zosyn  and will need to complete a 7-day course of treatment. - Case discussed with ID (Dr Levern Reader) who recommended CT renal stone protocol to rule out nephrolithiasis as nidus of infection and  recommending a 7-day course of IV antibiotic treatment. -CT renal stone protocol with left urothelial thickening suggestive of infection.  Nonobstructive 1.2 cm left nephrolithiasis.  Partially visualized moderate to large volume hiatal hernia.  Colonic diverticulosis with no acute diverticulitis. -Change IV Zosyn  to IV ceftazidime for easier dosing. -Posttreatment will need evaluation by urology for definite stone management as likely nidus of patient's recurrent UTIs. -ID consulted.  2.  Hypertension - Continue lisinopril .   - Bradycardia improved and as such we will resume half home dose Toprol -XL at 25 mg daily.   3.  Hyperlipidemia - Statin.  4.  Hypothyroidism - Synthroid .  5.  CKD stage IIIb -Stable.  6.  History of lung cancer status post chemoimmunotherapy -Outpatient follow-up with oncology.  7.  History of CAD -Toprol -XL held in the setting of bradycardia. -Heart rate improved. - Will resume Toprol -XL at half home dose.  - Continue Lipitor , aspirin .  8.  History of COPD/chronic hypoxic respiratory failure on 3 L O2 at baseline -Stable. - Continue Breztri  twice daily, DuoNebs as needed. - Continue home oxygen .  9.  Chronic weakness of bilateral lower extremities -Per admitting physician patient with a chronic weakness of bilateral lower extremities and sometimes feels like legs with sleeping. - Patient assessed by PT/OT. - No home health therapies needed.  10.  Prolonged QTc -It is noted that EKG showed normal sinus rhythm with prolonged QTc of . - Repeat EKG. - Keep potassium approximately 4, magnesium  approximately 2.   DVT prophylaxis: Heparin  Code Status: Full Family Communication: Updated patient.  No family at bedside. Disposition: Likely home once antibiotic course is completed unless patient has  adequate support at home for home IV antibiotics.    Status is: Inpatient Remains inpatient appropriate because: Severity of illness   Consultants:   ID: Dr. Levern Reader 01/31/2024  Procedures:  CXR 01/28/2024 CT renal stone protocol 01/30/2024  Antimicrobials:  Anti-infectives (From admission, onward)    Start     Dose/Rate Route Frequency Ordered Stop   01/31/24 1400  cefTAZidime (FORTAZ) 1 g in sodium chloride  0.9 % 100 mL IVPB        1 g 200 mL/hr over 30 Minutes Intravenous Every 24 hours 01/31/24 0944     01/29/24 0600  piperacillin -tazobactam (ZOSYN ) IVPB 3.375 g  Status:  Discontinued        3.375 g 12.5 mL/hr over 240 Minutes Intravenous Every 8 hours 01/29/24 0035 01/31/24 0943   01/28/24 2315  piperacillin -tazobactam (ZOSYN ) IVPB 3.375 g        3.375 g 100 mL/hr over 30 Minutes Intravenous Every 8 hours 01/28/24 2308 01/29/24 0113         Subjective: Patient sitting up in chair.  Denies any chest pain.  No change in chronic shortness of breath.  Denies any abdominal pain.  States has no help at home.   Objective: Vitals:   01/30/24 1922 01/31/24 0432 01/31/24 0917 01/31/24 1213  BP: (!) 186/87 (!) 152/68  (!) 142/73  Pulse: 92 78  74  Resp: 18 18  20   Temp: 97.6 F (36.4 C) (!) 97.5 F (36.4 C)    TempSrc: Oral Oral    SpO2: 94% 96% 98% 100%  Weight:      Height:        Intake/Output Summary (Last 24 hours) at 01/31/2024 1516 Last data filed at 01/31/2024 1014 Gross per 24 hour  Intake 483 ml  Output --  Net 483 ml   Filed Weights   01/28/24 2019 01/30/24 0944  Weight: 71.7 kg 71.6 kg    Examination:  General exam: NAD Respiratory system: Some diffuse crackles/coarse breath sounds.  Minimal expiratory wheezing.  Fair air movement.  Speaking in full sentences.  Cardiovascular system: RRR no murmurs rubs or gallops.  No JVD.  No pitting lower extremity edema.  Gastrointestinal system: Abdomen is soft, nontender, nondistended, positive bowel sounds.  No rebound.  No guarding.  Central nervous system: Alert and oriented. No focal neurological deficits. Extremities: Symmetric 5 x 5 power. Skin: No rashes,  lesions or ulcers Psychiatry: Judgement and insight appear normal. Mood & affect appropriate.     Data Reviewed: I have personally reviewed following labs and imaging studies  CBC: Recent Labs  Lab 01/28/24 2153 01/29/24 0405 01/30/24 0434 01/31/24 0418  WBC 6.0 6.5 5.5 6.6  NEUTROABS 4.6  --   --   --   HGB 11.3* 10.5* 10.1* 10.3*  HCT 37.0 34.6* 33.4* 33.3*  MCV 93.9 96.4 95.2 93.8  PLT 212 192 186 205    Basic Metabolic Panel: Recent Labs  Lab 01/28/24 2153 01/29/24 0405 01/30/24 0434 01/31/24 0418  NA 138 138 136 135  K 4.6 3.8 4.6 4.8  CL 103 105 103 101  CO2 27 26 26 23   GLUCOSE 109* 156* 114* 182*  BUN 39* 39* 45* 51*  CREATININE 1.28* 1.52* 1.59* 1.74*  CALCIUM  9.2 8.9 8.8* 9.4  MG  --  2.2  --  2.1  PHOS  --   --   --  2.8    GFR: Estimated Creatinine Clearance: 24 mL/min (A) (by C-G formula based on SCr of 1.74  mg/dL (H)).  Liver Function Tests: Recent Labs  Lab 01/28/24 2153 01/31/24 0418  AST 18  --   ALT 16  --   ALKPHOS 102  --   BILITOT 0.5  --   PROT 7.8  --   ALBUMIN 3.8 3.5    CBG: No results for input(s): "GLUCAP" in the last 168 hours.   Recent Results (from the past 240 hours)  Urine Culture     Status: Abnormal   Collection Time: 01/28/24  8:09 PM   Specimen: Urine, Clean Catch  Result Value Ref Range Status   Specimen Description   Final    URINE, CLEAN CATCH Performed at [facility], [address] [place], [place] [address]    Special Requests   Final    NONE Performed at [facility], [address], [place], [place] [address]    Culture 70,000 COLONIES/mL PSEUDOMONAS AERUGINOSA (A)  Final   Report Status 01/30/2024 FINAL  Final   Organism ID, Bacteria PSEUDOMONAS AERUGINOSA (A)  Final      Susceptibility   Pseudomonas aeruginosa - MIC*    CEFTAZIDIME 4 SENSITIVE Sensitive     CIPROFLOXACIN  2 RESISTANT Resistant     GENTAMICIN 8 INTERMEDIATE Intermediate     IMIPENEM 2  SENSITIVE Sensitive     PIP/TAZO 8 SENSITIVE Sensitive ug/mL    * 70,000 COLONIES/mL PSEUDOMONAS AERUGINOSA         Radiology Studies: CT RENAL STONE STUDY Result Date: 01/30/2024 CLINICAL DATA:  recurrent UTI EXAM: CT ABDOMEN AND PELVIS WITHOUT CONTRAST TECHNIQUE: Multidetector CT imaging of the abdomen and pelvis was performed following the standard protocol without IV contrast. RADIATION DOSE REDUCTION: This exam was performed according to the departmental dose-optimization program which includes automated exposure control, adjustment of the mA and/or kV according to patient size and/or use of iterative reconstruction technique. COMPARISON:  MRI abdomen 09/27/2022, CT abdomen pelvis 10/29/2023 FINDINGS: Lower chest: Partially visualized moderate to large hiatal hernia. Severe atherosclerotic plaque. Coronary artery calcification. Hepatobiliary: No focal liver abnormality. No gallstones, gallbladder wall thickening, or pericholecystic fluid. No biliary dilatation. Pancreas: Diffusely atrophic. No focal lesion. Otherwise normal pancreatic contour. No surrounding inflammatory changes. No main pancreatic ductal dilatation. Spleen: Normal in size without focal abnormality. Adrenals/Urinary Tract: No adrenal nodule bilaterally. No hydronephrosis. 1.2 cm left nephrolithiasis. Urothelial thickening on the left. No right nephrolithiasis. Fluid density lesions suggestive of simple renal cysts. Simple renal cysts, in the absence of clinically indicated signs/symptoms, require no independent follow-up. Subcentimeter hyperdensity lesions are too small to characterize-no further follow-up indicated, likely proteinaceous or hemorrhagic cyst. No ureterolithiasis or hydroureter. The urinary bladder is unremarkable. Stomach/Bowel: Stomach is within normal limits. No evidence of bowel wall thickening or dilatation. Colonic diverticulosis appendix appears normal. Vascular/Lymphatic: No abdominal aorta or iliac aneurysm.  Severe atherosclerotic plaque of the aorta and its branches. No abdominal, pelvic, or inguinal lymphadenopathy. Reproductive: Uterus and bilateral adnexa are unremarkable. Other: No intraperitoneal free fluid. No intraperitoneal free gas. No organized fluid collection. Musculoskeletal: Tiny fat containing umbilical hernia. No suspicious lytic or blastic osseous lesions. No acute displaced fracture. Multilevel severe degenerative changes of the spine. IMPRESSION: 1. Left urothelial thickening suggestive of infection. Limited evaluation on this noncontrast study. Correlate with urinalysis. 2. Nonobstructive 1.2 cm left nephrolithiasis. 3. Partially visualized moderate to large volume hiatal hernia. 4. Colonic diverticulosis with no acute diverticulitis. Electronically Signed   By: Morgane  Naveau M.D.   On: 01/30/2024 10:16  Scheduled Meds:  aspirin  EC  81 mg Oral QHS   atorvastatin   80 mg Oral q1800   budesonide -glycopyrrolate -formoterol   2 puff Inhalation BID   heparin   5,000 Units Subcutaneous Q8H   levothyroxine   175 mcg Oral Q0600   lisinopril   10 mg Oral Daily   metoprolol  succinate  25 mg Oral Daily   sodium chloride  flush  3 mL Intravenous Q12H   sodium chloride  flush  3 mL Intravenous Q12H   Continuous Infusions:  cefTAZidime (FORTAZ)  IV 1 g (01/31/24 1319)     LOS: 2 days    Time spent: 40 mins    Hilda Lovings, MD [facility]   To contact the attending provider between 7A-7P or the covering provider during after hours 7P-7A, please log into the web site [email or web address] and access using universal [place] password for that web site. If you do not have the password, please call the hospital operator.  01/31/2024, 3:16 PM

## 2024-01-31 NOTE — Consult Note (Signed)
 [facility]  Total days of antibiotics 4  Reason for Consult: pseudomonas recurrent uti   Referring Physician: Hildy Lowers  Principal Problem:   Acute cystitis Active Problems:   Hyperlipidemia   Hypothyroidism   CKD (chronic kidney disease), stage III (HCC)   Essential hypertension   H/O: lung cancer   Chronic hypoxic respiratory failure (HCC)   History of CAD (coronary artery disease)   History of COPD   Chronic weakness of both lower extremities   QT prolongation    HPI: Abigail [name] is a 88 y.o. female with hx of HTN, COPD, CAD, who is on chronic 3L [place] who was admitted on 6/6 for recurrence of UTI, she reports being on cipro  for the past 2-3 wk without improvement of symptoms. Bladder pressure, frequency,and fatigue. Her PCP referred patient to the ED since ur CX growing PSA -- resistant to FQ. She was started on piptazo, and reports some symptom imrpovement. She has been afebrile. Her WBC in 6/5 which is WNL. Repeat urine cx here also show PsA. She is was admitted with cr at 1.3 but having some aki with cr now up to 1/74. She underwent renal ct that didn't show any obstructions but found 1.2 cm left nephrolithiasis. ID asked to weigh in on management, she is on day 3 of abtx  Past Medical History:  Diagnosis Date   Anemia    Arthritis    knees, back   Bacteremia 04/08/2012   CAP (community acquired pneumonia) 04/12/2012   Carotid artery occlusion    Chronic kidney disease    stage 3 ckd no nephrologist   Complete uterine prolapse with prolapse of anterior vaginal wall    Complication of anesthesia    hard to wake up per pt   Constipation    Coronary artery disease    Diverticulitis yrs ago coialitis   Dyspnea    History of blood transfusion    History of radiation therapy    right lung 08/05/2021-09/18/2021  Dr Retta Caster   Hypertension    Hypothyroid    LLQ abdominal pain 04/05/2012   Lung cancer (HCC)    Numbness    in hands at times    Pneumonia    Pre-diabetes    Scoliosis    STEMI (ST elevation myocardial infarction) (HCC) 10/26/2019   DES RCA   Wears dentures    full dentures   Wears glasses    for reading    Allergies:  Allergies  Allergen Reactions   Codeine Nausea And Vomiting   Norvasc  [Amlodipine ] Rash and Other (See Comments)    rash      MEDICATIONS:  aspirin  EC  81 mg Oral QHS   atorvastatin   80 mg Oral q1800   budesonide -glycopyrrolate -formoterol   2 puff Inhalation BID   heparin   5,000 Units Subcutaneous Q8H   levothyroxine   175 mcg Oral Q0600   lisinopril   10 mg Oral Daily   sodium chloride  flush  3 mL Intravenous Q12H   sodium chloride  flush  3 mL Intravenous Q12H    Social History   Tobacco Use   Smoking status: Former    Current packs/day: 0.00    Average packs/day: 1.5 packs/day for 35.0 years (52.5 ttl pk-yrs)    Types: Cigarettes    Start date: 04/06/1951    Quit date: 04/05/1986    Years since quitting: 37.8   Smokeless tobacco: Never  Vaping Use   Vaping status: Never Used  Substance Use  Topics   Alcohol use: No   Drug use: No    Family History  Problem Relation Age of Onset   Hypertension Mother    Colon cancer Neg Hx    Stomach cancer Neg Hx    Esophageal cancer Neg Hx     Review of Systems - 12 point ros is negative except what is mentioned in hpi   OBJECTIVE: Temp:  [97.5 F (36.4 C)-97.6 F (36.4 C)] 97.5 F (36.4 C) (06/08 0432) Pulse Rate:  [74-92] 74 (06/08 1213) Resp:  [18-20] 20 (06/08 1213) BP: (142-186)/(68-87) 142/73 (06/08 1213) SpO2:  [94 %-100 %] 100 % (06/08 1213) Physical Exam  Constitutional:  oriented to person, place, and time. appears well-developed and well-nourished. No distress.  HENT: [place]/AT, PERRLA, no scleral icterus Mouth/Throat: Oropharynx is clear and moist. No oropharyngeal exudate.  Cardiovascular: Normal rate, regular rhythm and normal heart sounds. Exam reveals no gallop and no friction rub.  No murmur heard.   Pulmonary/Chest: Effort normal and breath sounds normal. No respiratory distress.  has no wheezes.  Neck = supple, no nuchal rigidity Abdominal: Soft. Bowel sounds are normal.  exhibits no distension. There is no tenderness.  Lymphadenopathy: no cervical adenopathy. No axillary adenopathy Neurological: alert and oriented to person, place, and time.  Skin: Skin is warm and dry. No rash noted. No erythema.  Psychiatric: a normal mood and affect.  behavior is normal.    LABS: Results for orders placed or performed during the hospital encounter of 01/28/24 (from the past 48 hours)  CBC     Status: Abnormal   Collection Time: 01/30/24  4:34 AM  Result Value Ref Range   WBC 5.5 4.0 - 10.5 K/uL   RBC 3.51 (L) 3.87 - 5.11 MIL/uL   Hemoglobin 10.1 (L) 12.0 - 15.0 g/dL   HCT 16.1 (L) 09.6 - 04.5 %   MCV 95.2 80.0 - 100.0 fL   MCH 28.8 26.0 - 34.0 pg   MCHC 30.2 30.0 - 36.0 g/dL   RDW 40.9 81.1 - 91.4 %   Platelets 186 150 - 400 K/uL   nRBC 0.0 0.0 - 0.2 %    Comment: Performed at [facility], [address], [place], [place] [address]  Basic metabolic panel     Status: Abnormal   Collection Time: 01/30/24  4:34 AM  Result Value Ref Range   Sodium 136 135 - 145 mmol/L   Potassium 4.6 3.5 - 5.1 mmol/L   Chloride 103 98 - 111 mmol/L   CO2 26 22 - 32 mmol/L   Glucose, Bld 114 (H) 70 - 99 mg/dL    Comment: Glucose reference range applies only to samples taken after fasting for at least 8 hours.   BUN 45 (H) 8 - 23 mg/dL   Creatinine, Ser 6.21 (H) 0.44 - 1.00 mg/dL   Calcium  8.8 (L) 8.9 - 10.3 mg/dL   GFR, Estimated 32 (L) >60 mL/min    Comment: (NOTE) Calculated using the CKD-EPI Creatinine Equation (2021)    Anion gap 7 5 - 15    Comment: Performed at [facility], [address], [place], [place] [address]  CBC     Status: Abnormal   Collection Time: 01/31/24  4:18 AM  Result Value Ref Range   WBC 6.6 4.0 - 10.5 K/uL   RBC 3.55 (L) 3.87 -  5.11 MIL/uL   Hemoglobin 10.3 (L) 12.0 - 15.0 g/dL   HCT 78.4 (L) 69.6 - 29.5 %  MCV 93.8 80.0 - 100.0 fL   MCH 29.0 26.0 - 34.0 pg   MCHC 30.9 30.0 - 36.0 g/dL   RDW 16.1 09.6 - 04.5 %   Platelets 205 150 - 400 K/uL   nRBC 0.0 0.0 - 0.2 %    Comment: Performed at [facility], [address], [place], [place] [address]  Renal function panel     Status: Abnormal   Collection Time: 01/31/24  4:18 AM  Result Value Ref Range   Sodium 135 135 - 145 mmol/L   Potassium 4.8 3.5 - 5.1 mmol/L   Chloride 101 98 - 111 mmol/L   CO2 23 22 - 32 mmol/L   Glucose, Bld 182 (H) 70 - 99 mg/dL    Comment: Glucose reference range applies only to samples taken after fasting for at least 8 hours.   BUN 51 (H) 8 - 23 mg/dL   Creatinine, Ser 1.91 (H) 0.44 - 1.00 mg/dL   Calcium  9.4 8.9 - 10.3 mg/dL   Phosphorus 2.8 2.5 - 4.6 mg/dL   Albumin 3.5 3.5 - 5.0 g/dL   GFR, Estimated 28 (L) >60 mL/min    Comment: (NOTE) Calculated using the CKD-EPI Creatinine Equation (2021)    Anion gap 11 5 - 15    Comment: Performed at [facility], [address] [place], [place] [address]  Magnesium      Status: None   Collection Time: 01/31/24  4:18 AM  Result Value Ref Range   Magnesium  2.1 1.7 - 2.4 mg/dL    Comment: Performed at [facility], [address] [place], [place] [address]  Sodium, urine, random     Status: None   Collection Time: 01/31/24 11:22 AM  Result Value Ref Range   Sodium, Ur 35 mmol/L    Comment: Performed at [facility], [address], [place], [place] [address]  Creatinine, urine, random     Status: None   Collection Time: 01/31/24 11:22 AM  Result Value Ref Range   Creatinine, Urine 55 mg/dL    Comment: Performed at [facility], [address], [place], [place] [address]    MICRO: Pseudomonas aeruginosa      MIC    CEFTAZIDIME 4 SENSITIVE Sensitive    CIPROFLOXACIN  2 RESISTANT  Resistant    GENTAMICIN 8 INTERMEDI... Intermediate    IMIPENEM 2 SENSITIVE Sensitive    PIP/TAZO 8 SENSITIVE... Sensitive    IMAGING: CT RENAL STONE STUDY Result Date: 01/30/2024 CLINICAL DATA:  recurrent UTI EXAM: CT ABDOMEN AND PELVIS WITHOUT CONTRAST TECHNIQUE: Multidetector CT imaging of the abdomen and pelvis was performed following the standard protocol without IV contrast. RADIATION DOSE REDUCTION: This exam was performed according to the departmental dose-optimization program which includes automated exposure control, adjustment of the mA and/or kV according to patient size and/or use of iterative reconstruction technique. COMPARISON:  MRI abdomen 09/27/2022, CT abdomen pelvis 10/29/2023 FINDINGS: Lower chest: Partially visualized moderate to large hiatal hernia. Severe atherosclerotic plaque. Coronary artery calcification. Hepatobiliary: No focal liver abnormality. No gallstones, gallbladder wall thickening, or pericholecystic fluid. No biliary dilatation. Pancreas: Diffusely atrophic. No focal lesion. Otherwise normal pancreatic contour. No surrounding inflammatory changes. No main pancreatic ductal dilatation. Spleen: Normal in size without focal abnormality. Adrenals/Urinary Tract: No adrenal nodule bilaterally. No hydronephrosis. 1.2 cm left nephrolithiasis. Urothelial thickening on the left. No right nephrolithiasis. Fluid density lesions suggestive of simple renal cysts. Simple renal cysts, in the absence of clinically indicated signs/symptoms,  require no independent follow-up. Subcentimeter hyperdensity lesions are too small to characterize-no further follow-up indicated, likely proteinaceous or hemorrhagic cyst. No ureterolithiasis or hydroureter. The urinary bladder is unremarkable. Stomach/Bowel: Stomach is within normal limits. No evidence of bowel wall thickening or dilatation. Colonic diverticulosis appendix appears normal. Vascular/Lymphatic: No abdominal aorta or iliac aneurysm. Severe  atherosclerotic plaque of the aorta and its branches. No abdominal, pelvic, or inguinal lymphadenopathy. Reproductive: Uterus and bilateral adnexa are unremarkable. Other: No intraperitoneal free fluid. No intraperitoneal free gas. No organized fluid collection. Musculoskeletal: Tiny fat containing umbilical hernia. No suspicious lytic or blastic osseous lesions. No acute displaced fracture. Multilevel severe degenerative changes of the spine. IMPRESSION: 1. Left urothelial thickening suggestive of infection. Limited evaluation on this noncontrast study. Correlate with urinalysis. 2. Nonobstructive 1.2 cm left nephrolithiasis. 3. Partially visualized moderate to large volume hiatal hernia. 4. Colonic diverticulosis with no acute diverticulitis. Electronically Signed   By: Morgane  Naveau M.D.   On: 01/30/2024 10:16    HISTORICAL MICRO/IMAGING  Assessment/Plan:  88yo F with recurrent uti with pseudomonas, with no oral options. Currently day 3 of treatment course  - plan to switch from piptazo to ceftaz to complete a total of 7 days of treatment. - patient likely to stay here to complete course of therapy - noticing some improvement thus far - as outpatient, recommend for her to see urology.  evaluation of this patient requires complex antimicrobial therapy evaluation and counseling and isolation needs for disease transmission risk assessment and mitigation.

## 2024-02-01 ENCOUNTER — Ambulatory Visit: Admitting: Pharmacist

## 2024-02-01 DIAGNOSIS — R29898 Other symptoms and signs involving the musculoskeletal system: Secondary | ICD-10-CM | POA: Diagnosis not present

## 2024-02-01 DIAGNOSIS — J9611 Chronic respiratory failure with hypoxia: Secondary | ICD-10-CM | POA: Diagnosis not present

## 2024-02-01 DIAGNOSIS — R9431 Abnormal electrocardiogram [ECG] [EKG]: Secondary | ICD-10-CM | POA: Diagnosis not present

## 2024-02-01 DIAGNOSIS — N3000 Acute cystitis without hematuria: Secondary | ICD-10-CM | POA: Diagnosis not present

## 2024-02-01 LAB — CBC
HCT: 31.6 % — ABNORMAL LOW (ref 36.0–46.0)
Hemoglobin: 9.7 g/dL — ABNORMAL LOW (ref 12.0–15.0)
MCH: 29.3 pg (ref 26.0–34.0)
MCHC: 30.7 g/dL (ref 30.0–36.0)
MCV: 95.5 fL (ref 80.0–100.0)
Platelets: 189 10*3/uL (ref 150–400)
RBC: 3.31 MIL/uL — ABNORMAL LOW (ref 3.87–5.11)
RDW: 14.8 % (ref 11.5–15.5)
WBC: 7.7 10*3/uL (ref 4.0–10.5)
nRBC: 0 % (ref 0.0–0.2)

## 2024-02-01 LAB — BASIC METABOLIC PANEL WITH GFR
Anion gap: 8 (ref 5–15)
BUN: 50 mg/dL — ABNORMAL HIGH (ref 8–23)
CO2: 24 mmol/L (ref 22–32)
Calcium: 8.7 mg/dL — ABNORMAL LOW (ref 8.9–10.3)
Chloride: 103 mmol/L (ref 98–111)
Creatinine, Ser: 1.67 mg/dL — ABNORMAL HIGH (ref 0.44–1.00)
GFR, Estimated: 30 mL/min — ABNORMAL LOW (ref >60)
Glucose, Bld: 112 mg/dL — ABNORMAL HIGH (ref 70–99)
Potassium: 4.9 mmol/L (ref 3.5–5.1)
Sodium: 135 mmol/L (ref 135–145)

## 2024-02-01 MED ORDER — FUROSEMIDE 10 MG/ML IJ SOLN: 20.0000 mg | Freq: Once | INTRAMUSCULAR | Status: DC

## 2024-02-01 NOTE — Progress Notes (Signed)
 PROGRESS NOTE    Abigail [name]  ZHY:[id number] DOB: July 01, 1936 DOA: 01/28/2024 PCP: [name], FNP    Chief Complaint  Patient presents with   Urinary Tract Infection    Brief Narrative:  Patient is a 88 year old female history of hypertension, hyperlipidemia, CAD, carotid disease, history of non-small cell lung cancer and adenocarcinoma status postchemotherapy and immunotherapy, CKD stage IIIb, hypothyroidism, COPD and chronic hypoxic respiratory failure on 3 L O2 presented to the ED with complaints of UTI.  Patient noted to have received 2 rounds of oral antibiotics however still with ongoing symptoms.  Patient noted to have had recent urine culture grew Pseudomonas.  Patient placed on oral ciprofloxacin  however noted to have failed outpatient treatment and patient admitted for IV antibiotics.   Assessment & Plan:   Principal Problem:   Acute cystitis Active Problems:   Hyperlipidemia   Hypothyroidism   CKD (chronic kidney disease), stage III (HCC)   Essential hypertension   H/O: lung cancer   Chronic hypoxic respiratory failure (HCC)   History of CAD (coronary artery disease)   History of COPD   Chronic weakness of both lower extremities   QT prolongation  #1 acute cystitis due to Pseudomonas aeruginosa -Patient noted to have presented to the ED as referred from PCP. - Urine cultures from 01/19/2024 with Pseudomonas aeruginosa. - Patient stated completed a 10-day course of oral ciprofloxacin  and prior to that an oral antibiotic however with no significant improvement with her urinary tract symptoms. - Repeat urinalysis done with large leukocytes, nitrite negative, rare bacteria, WBC > 50. - Urine culture with Pseudomonas aeruginosa resistant to ciprofloxacin .  - Was on IV Zosyn  and has been transition to IV ceftazidime to complete a 7-day course of treatment. - Case discussed with ID (Dr Levern Reader) who recommended CT renal stone protocol to rule out nephrolithiasis as nidus of  infection and recommending a 7-day course of IV antibiotic treatment. -CT renal stone protocol with left urothelial thickening suggestive of infection.  Nonobstructive 1.2 cm left nephrolithiasis.  Partially visualized moderate to large volume hiatal hernia.  Colonic diverticulosis with no acute diverticulitis. -Posttreatment will need evaluation by urology for definite stone management as likely nidus of patient's recurrent UTIs. -ID consulted.  2.  Hypertension - Continue lisinopril , Toprol -XL.   3.  Hyperlipidemia - Statin.  4.  Hypothyroidism - Synthroid .  5.  CKD stage IIIb -Stable.  6.  History of lung cancer status post chemoimmunotherapy -Outpatient follow-up with oncology.  7.  History of CAD -Toprol -XL held in the setting of bradycardia. -Heart rate improved. - Continue Toprol -XL at half home dose.   - Continue Lipitor , aspirin .  8.  History of COPD/chronic hypoxic respiratory failure on 3 L O2 at baseline -Stable. - Continue Breztri  twice daily, DuoNebs as needed. - Continue home oxygen .  9.  Chronic weakness of bilateral lower extremities -Per admitting physician patient with a chronic weakness of bilateral lower extremities and sometimes feels like legs with sleeping. - Patient assessed by PT/OT. - No home health therapies needed.  10.  Prolonged QTc -It is noted that EKG showed normal sinus rhythm with prolonged QTc of . - Repeat EKG with resolution of QTc prolongation.. - Keep potassium approximately 4, magnesium  approximately 2.   DVT prophylaxis: Heparin  Code Status: Full Family Communication: Updated patient.  No family at bedside. Disposition: Likely home once antibiotic course is completed.   Status is: Inpatient Remains inpatient appropriate because: Severity of illness   Consultants:  ID: Dr. Levern Reader 01/31/2024  Procedures:  CXR 01/28/2024 CT renal stone protocol 01/30/2024  Antimicrobials:  Anti-infectives (From admission, onward)     Start     Dose/Rate Route Frequency Ordered Stop   01/31/24 1400  cefTAZidime (FORTAZ) 1 g in sodium chloride  0.9 % 100 mL IVPB        1 g 200 mL/hr over 30 Minutes Intravenous Every 24 hours 01/31/24 0944     01/29/24 0600  piperacillin -tazobactam (ZOSYN ) IVPB 3.375 g  Status:  Discontinued        3.375 g 12.5 mL/hr over 240 Minutes Intravenous Every 8 hours 01/29/24 0035 01/31/24 0943   01/28/24 2315  piperacillin -tazobactam (ZOSYN ) IVPB 3.375 g        3.375 g 100 mL/hr over 30 Minutes Intravenous Every 8 hours 01/28/24 2308 01/29/24 0113         Subjective: Sleeping but easily arousable.  Denies any chest pain.  No change in chronic shortness of breath.  No abdominal pain.  Tolerating current diet.   Objective: Vitals:   01/31/24 2035 01/31/24 2056 02/01/24 0523 02/01/24 0923  BP:  (!) 136/51 (!) 147/61   Pulse:  66 (!) 59   Resp:  18 20   Temp:  97.8 F (36.6 C) 97.8 F (36.6 C)   TempSrc:      SpO2: 98% 100% 100% 98%  Weight:      Height:        Intake/Output Summary (Last 24 hours) at 02/01/2024 0943 Last data filed at 01/31/2024 1700 Gross per 24 hour  Intake 483 ml  Output --  Net 483 ml   Filed Weights   01/28/24 2019 01/30/24 0944  Weight: 71.7 kg 71.6 kg    Examination:  General exam: NAD Respiratory system: Decreased scattered coarse breath sounds/crackles.  No significant wheezing noted.  Fair air movement.  Speaking in full sentences.  Cardiovascular system: Regular rate rhythm no murmurs rubs or gallops.  No JVD.  No pitting lower extremity edema.  Gastrointestinal system: Abdomen is soft, nontender, nondistended, positive bowel sounds.  No rebound.  No guarding.  Central nervous system: Alert and oriented. No focal neurological deficits. Extremities: Symmetric 5 x 5 power. Skin: No rashes, lesions or ulcers Psychiatry: Judgement and insight appear normal. Mood & affect appropriate.     Data Reviewed: I have personally reviewed following labs and  imaging studies  CBC: Recent Labs  Lab 01/28/24 2153 01/29/24 0405 01/30/24 0434 01/31/24 0418 02/01/24 0432  WBC 6.0 6.5 5.5 6.6 7.7  NEUTROABS 4.6  --   --   --   --   HGB 11.3* 10.5* 10.1* 10.3* 9.7*  HCT 37.0 34.6* 33.4* 33.3* 31.6*  MCV 93.9 96.4 95.2 93.8 95.5  PLT 212 192 186 205 189    Basic Metabolic Panel: Recent Labs  Lab 01/28/24 2153 01/29/24 0405 01/30/24 0434 01/31/24 0418 02/01/24 0432  NA 138 138 136 135 135  K 4.6 3.8 4.6 4.8 4.9  CL 103 105 103 101 103  CO2 27 26 26 23 24   GLUCOSE 109* 156* 114* 182* 112*  BUN 39* 39* 45* 51* 50*  CREATININE 1.28* 1.52* 1.59* 1.74* 1.67*  CALCIUM  9.2 8.9 8.8* 9.4 8.7*  MG  --  2.2  --  2.1  --   PHOS  --   --   --  2.8  --     GFR: Estimated Creatinine Clearance: 25 mL/min (A) (by C-G formula based on SCr of 1.67 mg/dL (H)).  Liver Function Tests: Recent  Labs  Lab 01/28/24 2153 01/31/24 0418  AST 18  --   ALT 16  --   ALKPHOS 102  --   BILITOT 0.5  --   PROT 7.8  --   ALBUMIN 3.8 3.5    CBG: No results for input(s): "GLUCAP" in the last 168 hours.   Recent Results (from the past 240 hours)  Urine Culture     Status: Abnormal   Collection Time: 01/28/24  8:09 PM   Specimen: Urine, Clean Catch  Result Value Ref Range Status   Specimen Description   Final    URINE, CLEAN CATCH Performed at [facility], [address], [place], [place] [address]    Special Requests   Final    NONE Performed at [facility], [address], [place], [place] [address]    Culture 70,000 COLONIES/mL PSEUDOMONAS AERUGINOSA (A)  Final   Report Status 01/30/2024 FINAL  Final   Organism ID, Bacteria PSEUDOMONAS AERUGINOSA (A)  Final      Susceptibility   Pseudomonas aeruginosa - MIC*    CEFTAZIDIME 4 SENSITIVE Sensitive     CIPROFLOXACIN  2 RESISTANT Resistant     GENTAMICIN 8 INTERMEDIATE Intermediate     IMIPENEM 2 SENSITIVE Sensitive     PIP/TAZO 8 SENSITIVE Sensitive ug/mL     * 70,000 COLONIES/mL PSEUDOMONAS AERUGINOSA         Radiology Studies: CT RENAL STONE STUDY Result Date: 01/30/2024 CLINICAL DATA:  recurrent UTI EXAM: CT ABDOMEN AND PELVIS WITHOUT CONTRAST TECHNIQUE: Multidetector CT imaging of the abdomen and pelvis was performed following the standard protocol without IV contrast. RADIATION DOSE REDUCTION: This exam was performed according to the departmental dose-optimization program which includes automated exposure control, adjustment of the mA and/or kV according to patient size and/or use of iterative reconstruction technique. COMPARISON:  MRI abdomen 09/27/2022, CT abdomen pelvis 10/29/2023 FINDINGS: Lower chest: Partially visualized moderate to large hiatal hernia. Severe atherosclerotic plaque. Coronary artery calcification. Hepatobiliary: No focal liver abnormality. No gallstones, gallbladder wall thickening, or pericholecystic fluid. No biliary dilatation. Pancreas: Diffusely atrophic. No focal lesion. Otherwise normal pancreatic contour. No surrounding inflammatory changes. No main pancreatic ductal dilatation. Spleen: Normal in size without focal abnormality. Adrenals/Urinary Tract: No adrenal nodule bilaterally. No hydronephrosis. 1.2 cm left nephrolithiasis. Urothelial thickening on the left. No right nephrolithiasis. Fluid density lesions suggestive of simple renal cysts. Simple renal cysts, in the absence of clinically indicated signs/symptoms, require no independent follow-up. Subcentimeter hyperdensity lesions are too small to characterize-no further follow-up indicated, likely proteinaceous or hemorrhagic cyst. No ureterolithiasis or hydroureter. The urinary bladder is unremarkable. Stomach/Bowel: Stomach is within normal limits. No evidence of bowel wall thickening or dilatation. Colonic diverticulosis appendix appears normal. Vascular/Lymphatic: No abdominal aorta or iliac aneurysm. Severe atherosclerotic plaque of the aorta and its branches. No  abdominal, pelvic, or inguinal lymphadenopathy. Reproductive: Uterus and bilateral adnexa are unremarkable. Other: No intraperitoneal free fluid. No intraperitoneal free gas. No organized fluid collection. Musculoskeletal: Tiny fat containing umbilical hernia. No suspicious lytic or blastic osseous lesions. No acute displaced fracture. Multilevel severe degenerative changes of the spine. IMPRESSION: 1. Left urothelial thickening suggestive of infection. Limited evaluation on this noncontrast study. Correlate with urinalysis. 2. Nonobstructive 1.2 cm left nephrolithiasis. 3. Partially visualized moderate to large volume hiatal hernia. 4. Colonic diverticulosis with no acute diverticulitis. Electronically Signed   By: Morgane  Naveau M.D.   On: 01/30/2024 10:16        Scheduled Meds:  aspirin  EC  81 mg Oral QHS   atorvastatin   80 mg Oral q1800   budesonide -glycopyrrolate -formoterol   2 puff Inhalation BID   heparin   5,000 Units Subcutaneous Q8H   levothyroxine   175 mcg Oral Q0600   lisinopril   10 mg Oral Daily   metoprolol  succinate  25 mg Oral Daily   sodium chloride  flush  3 mL Intravenous Q12H   sodium chloride  flush  3 mL Intravenous Q12H   Continuous Infusions:  cefTAZidime (FORTAZ)  IV 1 g (01/31/24 1319)     LOS: 3 days    Time spent: 35 mins    Hilda Lovings, MD [facility]   To contact the attending provider between 7A-7P or the covering provider during after hours 7P-7A, please log into the web site [email or web address] and access using universal [place] password for that web site. If you do not have the password, please call the hospital operator.  02/01/2024, 9:43 AM

## 2024-02-01 NOTE — Plan of Care (Signed)

## 2024-02-01 NOTE — Plan of Care (Signed)

## 2024-02-02 DIAGNOSIS — R9431 Abnormal electrocardiogram [ECG] [EKG]: Secondary | ICD-10-CM | POA: Diagnosis not present

## 2024-02-02 DIAGNOSIS — N3000 Acute cystitis without hematuria: Secondary | ICD-10-CM | POA: Diagnosis not present

## 2024-02-02 DIAGNOSIS — Z8679 Personal history of other diseases of the circulatory system: Secondary | ICD-10-CM | POA: Diagnosis not present

## 2024-02-02 DIAGNOSIS — J9611 Chronic respiratory failure with hypoxia: Secondary | ICD-10-CM | POA: Diagnosis not present

## 2024-02-02 LAB — CBC WITH DIFFERENTIAL/PLATELET
Abs Immature Granulocytes: 0.05 10*3/uL (ref 0.00–0.07)
Basophils Absolute: 0 10*3/uL (ref 0.0–0.1)
Basophils Relative: 1 %
Eosinophils Absolute: 0.3 10*3/uL (ref 0.0–0.5)
Eosinophils Relative: 3 %
HCT: 32.5 % — ABNORMAL LOW (ref 36.0–46.0)
Hemoglobin: 9.7 g/dL — ABNORMAL LOW (ref 12.0–15.0)
Immature Granulocytes: 1 %
Lymphocytes Relative: 11 %
Lymphs Abs: 0.8 10*3/uL (ref 0.7–4.0)
MCH: 29.1 pg (ref 26.0–34.0)
MCHC: 29.8 g/dL — ABNORMAL LOW (ref 30.0–36.0)
MCV: 97.6 fL (ref 80.0–100.0)
Monocytes Absolute: 0.9 10*3/uL (ref 0.1–1.0)
Monocytes Relative: 11 %
Neutro Abs: 5.8 10*3/uL (ref 1.7–7.7)
Neutrophils Relative %: 73 %
Platelets: 217 10*3/uL (ref 150–400)
RBC: 3.33 MIL/uL — ABNORMAL LOW (ref 3.87–5.11)
RDW: 15.1 % (ref 11.5–15.5)
WBC: 7.8 10*3/uL (ref 4.0–10.5)
nRBC: 0 % (ref 0.0–0.2)

## 2024-02-02 LAB — BASIC METABOLIC PANEL WITH GFR
Anion gap: 9 (ref 5–15)
BUN: 43 mg/dL — ABNORMAL HIGH (ref 8–23)
CO2: 24 mmol/L (ref 22–32)
Calcium: 9.1 mg/dL (ref 8.9–10.3)
Chloride: 103 mmol/L (ref 98–111)
Creatinine, Ser: 1.41 mg/dL — ABNORMAL HIGH (ref 0.44–1.00)
GFR, Estimated: 37 mL/min — ABNORMAL LOW (ref >60)
Glucose, Bld: 111 mg/dL — ABNORMAL HIGH (ref 70–99)
Potassium: 4.3 mmol/L (ref 3.5–5.1)
Sodium: 136 mmol/L (ref 135–145)

## 2024-02-02 NOTE — Plan of Care (Signed)

## 2024-02-02 NOTE — Progress Notes (Signed)
 PROGRESS NOTE    Abigail [name]  UEA:[id number] DOB: 08-Jan-1936 DOA: 01/28/2024 PCP: [name], FNP    Chief Complaint  Patient presents with   Urinary Tract Infection    Brief Narrative:  Patient is a 88 year old female history of hypertension, hyperlipidemia, CAD, carotid disease, history of non-small cell lung cancer and adenocarcinoma status postchemotherapy and immunotherapy, CKD stage IIIb, hypothyroidism, COPD and chronic hypoxic respiratory failure on 3 L O2 presented to the ED with complaints of UTI.  Patient noted to have received 2 rounds of oral antibiotics however still with ongoing symptoms.  Patient noted to have had recent urine culture grew Pseudomonas.  Patient placed on oral ciprofloxacin  however noted to have failed outpatient treatment and patient admitted for IV antibiotics.   Assessment & Plan:   Principal Problem:   Acute cystitis Active Problems:   Hyperlipidemia   Hypothyroidism   CKD (chronic kidney disease), stage III (HCC)   Essential hypertension   H/O: lung cancer   Chronic hypoxic respiratory failure (HCC)   History of CAD (coronary artery disease)   History of COPD   Chronic weakness of both lower extremities   QT prolongation  #1 acute cystitis due to Pseudomonas aeruginosa -Patient noted to have presented to the ED as referred from PCP. - Urine cultures from 01/19/2024 with Pseudomonas aeruginosa. - Patient stated completed a 10-day course of oral ciprofloxacin  and prior to that an oral antibiotic however with no significant improvement with her urinary tract symptoms. - Repeat urinalysis done with large leukocytes, nitrite negative, rare bacteria, WBC > 50. - Urine culture with Pseudomonas aeruginosa resistant to ciprofloxacin .  - Was on IV Zosyn  and has been transitioned to IV ceftazidime to complete a 7-day course of treatment. - Case discussed with ID (Dr Levern Reader) who recommended CT renal stone protocol to rule out nephrolithiasis as nidus  of infection and recommending a 7-day course of IV antibiotic treatment. -CT renal stone protocol with left urothelial thickening suggestive of infection.  Nonobstructive 1.2 cm left nephrolithiasis.  Partially visualized moderate to large volume hiatal hernia.  Colonic diverticulosis with no acute diverticulitis. -Posttreatment will need evaluation by urology for definite stone management as likely nidus of patient's recurrent UTIs. -ID consulted and now signed off.  2.  Hypertension - Continue Toprol -XL, lisinopril .   3.  Hyperlipidemia - Continue statin.  4.  Hypothyroidism - Continue Synthroid .  5.  CKD stage IIIb -Stable.  6.  History of lung cancer status post chemoimmunotherapy -Outpatient follow-up with oncology.  7.  History of CAD -Toprol -XL held in the setting of bradycardia. -Heart rate improved. - Toprol -XL resumed at half home dose which we will continue for now.   - Continue aspirin , Lipitor .   8.  History of COPD/chronic hypoxic respiratory failure on 3 L O2 at baseline - Stable.   - Continue Breztri  twice daily, DuoNebs as needed.   - Continue home O2.   9.  Chronic weakness of bilateral lower extremities -Per admitting physician patient with a chronic weakness of bilateral lower extremities and sometimes feels like legs with sleeping. - Patient assessed by PT/OT. - No home health therapies needed.  10.  Prolonged QTc -It is noted that EKG showed normal sinus rhythm with prolonged QTc of . - Repeat EKG with resolution of QTc prolongation.. - Keep potassium approximately 4, magnesium  approximately 2.   DVT prophylaxis: Heparin  Code Status: Full Family Communication: Updated patient.  No family at bedside. Disposition: Home once completed 7-day course of antibiotic treatment.  Status is: Inpatient Remains inpatient appropriate because: Severity of illness   Consultants:  ID: Dr. Levern Reader 01/31/2024  Procedures:  CXR 01/28/2024 CT renal stone  protocol 01/30/2024  Antimicrobials:  Anti-infectives (From admission, onward)    Start     Dose/Rate Route Frequency Ordered Stop   01/31/24 1400  cefTAZidime (FORTAZ) 1 g in sodium chloride  0.9 % 100 mL IVPB        1 g 200 mL/hr over 30 Minutes Intravenous Every 24 hours 01/31/24 0944 02/04/24 2359   01/29/24 0600  piperacillin -tazobactam (ZOSYN ) IVPB 3.375 g  Status:  Discontinued        3.375 g 12.5 mL/hr over 240 Minutes Intravenous Every 8 hours 01/29/24 0035 01/31/24 0943   01/28/24 2315  piperacillin -tazobactam (ZOSYN ) IVPB 3.375 g        3.375 g 100 mL/hr over 30 Minutes Intravenous Every 8 hours 01/28/24 2308 01/29/24 0113         Subjective: Patient laying in bed.  Denies any chest pain or shortness of breath.  No abdominal pain.  Overall feels well.   Objective: Vitals:   02/01/24 2123 02/02/24 0442 02/02/24 0700 02/02/24 1137  BP: (!) 160/65 (!) 153/65  130/69  Pulse: 75 (!) 59  67  Resp: 18 16  16   Temp: (!) 97.5 F (36.4 C) 97.8 F (36.6 C)  98.2 F (36.8 C)  TempSrc: Oral Oral    SpO2: 96% 99%  99%  Weight:   75.7 kg   Height:        Intake/Output Summary (Last 24 hours) at 02/02/2024 1304 Last data filed at 02/02/2024 0730 Gross per 24 hour  Intake 240 ml  Output --  Net 240 ml   Filed Weights   01/30/24 0944 02/01/24 0836 02/02/24 0700  Weight: 71.6 kg 77.2 kg 75.7 kg    Examination:  General exam: NAD Respiratory system: Some decreased breath sounds in the bases otherwise clear.  No wheezing, no crackles, no rhonchi.  Fair air movement.  Speaking in full sentences.  Cardiovascular system: RRR no murmurs rubs or gallops.  No JVD.  No pitting lower extremity edema.   Gastrointestinal system: Abdomen is soft, nontender, nondistended, positive bowel sounds.  No rebound.  No guarding.  Central nervous system: Alert and oriented.  Moving extremities spontaneously.  No focal neurological deficits. Extremities: Symmetric 5 x 5 power. Skin: No rashes,  lesions or ulcers Psychiatry: Judgement and insight appear normal. Mood & affect appropriate.     Data Reviewed: I have personally reviewed following labs and imaging studies  CBC: Recent Labs  Lab 01/28/24 2153 01/29/24 0405 01/30/24 0434 01/31/24 0418 02/01/24 0432 02/02/24 0431  WBC 6.0 6.5 5.5 6.6 7.7 7.8  NEUTROABS 4.6  --   --   --   --  5.8  HGB 11.3* 10.5* 10.1* 10.3* 9.7* 9.7*  HCT 37.0 34.6* 33.4* 33.3* 31.6* 32.5*  MCV 93.9 96.4 95.2 93.8 95.5 97.6  PLT 212 192 186 205 189 217    Basic Metabolic Panel: Recent Labs  Lab 01/29/24 0405 01/30/24 0434 01/31/24 0418 02/01/24 0432 02/02/24 0431  NA 138 136 135 135 136  K 3.8 4.6 4.8 4.9 4.3  CL 105 103 101 103 103  CO2 26 26 23 24 24   GLUCOSE 156* 114* 182* 112* 111*  BUN 39* 45* 51* 50* 43*  CREATININE 1.52* 1.59* 1.74* 1.67* 1.41*  CALCIUM  8.9 8.8* 9.4 8.7* 9.1  MG 2.2  --  2.1  --   --  PHOS  --   --  2.8  --   --     GFR: Estimated Creatinine Clearance: 30.3 mL/min (A) (by C-G formula based on SCr of 1.41 mg/dL (H)).  Liver Function Tests: Recent Labs  Lab 01/28/24 2153 01/31/24 0418  AST 18  --   ALT 16  --   ALKPHOS 102  --   BILITOT 0.5  --   PROT 7.8  --   ALBUMIN 3.8 3.5    CBG: No results for input(s): "GLUCAP" in the last 168 hours.   Recent Results (from the past 240 hours)  Urine Culture     Status: Abnormal   Collection Time: 01/28/24  8:09 PM   Specimen: Urine, Clean Catch  Result Value Ref Range Status   Specimen Description   Final    URINE, CLEAN CATCH Performed at [facility], [address], [place], [place] [address]    Special Requests   Final    NONE Performed at [facility], [address], [place], [place] [address]    Culture 70,000 COLONIES/mL PSEUDOMONAS AERUGINOSA (A)  Final   Report Status 01/30/2024 FINAL  Final   Organism ID, Bacteria PSEUDOMONAS AERUGINOSA (A)  Final      Susceptibility   Pseudomonas  aeruginosa - MIC*    CEFTAZIDIME 4 SENSITIVE Sensitive     CIPROFLOXACIN  2 RESISTANT Resistant     GENTAMICIN 8 INTERMEDIATE Intermediate     IMIPENEM 2 SENSITIVE Sensitive     PIP/TAZO 8 SENSITIVE Sensitive ug/mL    * 70,000 COLONIES/mL PSEUDOMONAS AERUGINOSA         Radiology Studies: No results found.       Scheduled Meds:  aspirin  EC  81 mg Oral QHS   atorvastatin   80 mg Oral q1800   budesonide -glycopyrrolate -formoterol   2 puff Inhalation BID   furosemide  20 mg Intravenous Once   heparin   5,000 Units Subcutaneous Q8H   levothyroxine   175 mcg Oral Q0600   lisinopril   10 mg Oral Daily   metoprolol  succinate  25 mg Oral Daily   sodium chloride  flush  3 mL Intravenous Q12H   sodium chloride  flush  3 mL Intravenous Q12H   Continuous Infusions:  cefTAZidime (FORTAZ)  IV 1 g (02/01/24 1316)     LOS: 4 days    Time spent: 35 mins    Hilda Lovings, MD [facility]   To contact the attending provider between 7A-7P or the covering provider during after hours 7P-7A, please log into the web site [email or web address] and access using universal [place] password for that web site. If you do not have the password, please call the hospital operator.  02/02/2024, 1:04 PM

## 2024-02-02 NOTE — Progress Notes (Signed)
 Mobility Specialist - Progress Note   02/02/24 1411  Mobility  Activity Ambulated with assistance in hallway  Level of Assistance Modified independent, requires aide device or extra time  Assistive Device Front wheel walker  Distance Ambulated (ft) 350 ft  Activity Response Tolerated well  Mobility Referral Yes  Mobility visit 1 Mobility  Mobility Specialist Start Time (ACUTE ONLY) 1358  Mobility Specialist Stop Time (ACUTE ONLY) 1411  Mobility Specialist Time Calculation (min) (ACUTE ONLY) 13 min   Pt received EOB and agreeable to mobility. No complaints during session. Pt to EOB after session with all needs met.    [facility]

## 2024-02-03 DIAGNOSIS — A498 Other bacterial infections of unspecified site: Secondary | ICD-10-CM

## 2024-02-03 DIAGNOSIS — J9611 Chronic respiratory failure with hypoxia: Secondary | ICD-10-CM | POA: Diagnosis not present

## 2024-02-03 DIAGNOSIS — N3000 Acute cystitis without hematuria: Secondary | ICD-10-CM | POA: Diagnosis not present

## 2024-02-03 LAB — CBC
HCT: 30.3 % — ABNORMAL LOW (ref 36.0–46.0)
Hemoglobin: 9 g/dL — ABNORMAL LOW (ref 12.0–15.0)
MCH: 29 pg (ref 26.0–34.0)
MCHC: 29.7 g/dL — ABNORMAL LOW (ref 30.0–36.0)
MCV: 97.7 fL (ref 80.0–100.0)
Platelets: 195 10*3/uL (ref 150–400)
RBC: 3.1 MIL/uL — ABNORMAL LOW (ref 3.87–5.11)
RDW: 14.9 % (ref 11.5–15.5)
WBC: 7.1 10*3/uL (ref 4.0–10.5)
nRBC: 0 % (ref 0.0–0.2)

## 2024-02-03 LAB — BASIC METABOLIC PANEL WITH GFR
Anion gap: 6 (ref 5–15)
BUN: 36 mg/dL — ABNORMAL HIGH (ref 8–23)
CO2: 24 mmol/L (ref 22–32)
Calcium: 8.8 mg/dL — ABNORMAL LOW (ref 8.9–10.3)
Chloride: 106 mmol/L (ref 98–111)
Creatinine, Ser: 1.29 mg/dL — ABNORMAL HIGH (ref 0.44–1.00)
GFR, Estimated: 41 mL/min — ABNORMAL LOW (ref >60)
Glucose, Bld: 111 mg/dL — ABNORMAL HIGH (ref 70–99)
Potassium: 4.5 mmol/L (ref 3.5–5.1)
Sodium: 136 mmol/L (ref 135–145)

## 2024-02-03 MED ORDER — SODIUM CHLORIDE 0.9 % IV SOLN
1.0000 g | Freq: Two times a day (BID) | INTRAVENOUS | Status: AC
  Administered 2024-02-03 – 2024-02-04 (×3): 1 g via INTRAVENOUS
  Filled 2024-02-03 (×3): qty 1

## 2024-02-03 NOTE — TOC Progression Note (Signed)
 Transition of Care [facility]) - Progression Note    Patient Details  Name: Abigail [name] MRN: [id number] Date of Birth: 08-27-1935  [facility] CM/SW Contact  Alixandra Alfieri, Greggory Learn, RN Phone Number:  Clinical Narrative:    No needs identified during visit. TOC continue to follow.   Expected Discharge Plan: Home/Self Care Barriers to Discharge: Continued Medical Work up  Expected Discharge Plan and Services In-house Referral: NA Discharge Planning Services: CM Consult Post Acute Care Choice: NA Living arrangements for the past 2 months: Single Family Home                 DME Arranged: N/A DME Agency: NA       HH Arranged: NA HH Agency: NA         Social Determinants of Health (SDOH) Interventions SDOH Screenings   Food Insecurity: No Food Insecurity (01/29/2024)  Housing: Low Risk  (01/29/2024)  Transportation Needs: Unmet Transportation Needs (01/29/2024)  Utilities: Not At Risk (01/29/2024)  Alcohol Screen: Low Risk  (02/25/2023)  Depression (PHQ2-9): Low Risk  (08/24/2023)  Financial Resource Strain: Low Risk  (02/25/2023)  Physical Activity: Insufficiently Active (02/25/2023)  Social Connections: Unknown (01/29/2024)  Stress: No Stress Concern Present (02/25/2023)  Tobacco Use: Medium Risk (01/29/2024)    Readmission Risk Interventions     No data to display

## 2024-02-03 NOTE — Progress Notes (Signed)
 Progress Note   Patient: Abigail [name] QMV:[id number] DOB: [birth year] DOA: 01/28/2024  DOS: the patient was seen and examined on 02/03/2024   Brief hospital course:  88 year old female history of hypertension, hyperlipidemia, CAD, carotid disease, history of non-small cell lung cancer and adenocarcinoma status postchemotherapy and immunotherapy, CKD stage IIIb, hypothyroidism, COPD and chronic hypoxic respiratory failure on 3 L O2 presented to the ED with complaints of UTI. Patient noted to have received 2 rounds of oral antibiotics however still with ongoing symptoms. Patient noted to have had recent urine culture grew Pseudomonas. Patient placed on oral ciprofloxacin  however noted to have failed outpatient treatment and patient admitted for IV antibiotics.   Assessment and Plan:  Acute cystitis secondary to Pseudomonas - Culture showing Pseudomonas aeruginosa resistant to ciprofloxacin .  Initially on Zosyn , transition to ceftazidime.  Infectious disease recommending 7-day course (today day 6 of 7).  CT scan noting nonobstructive 1.2 cm left nephrolithiasis, likely etiology of patient's chronic UTI.  May warrant urology management in the outpatient setting.  ID signed off.  Anticipate discharge tomorrow.  Hypertension/hyperlipidemia/hypothyroidism - Resume home medication regimen.  CKD 3B - Currently stable.  Will recheck BMP in AM.  CAD - Aspirin , Toprol -XL, Lipitor  on board.  COPD with chronic hypoxic respiratory failure - Currently on 3 L nasal cannula, patient's baseline.  Nebulizers on board.  Physical debilitation muscle weakness - Evaluated by PT/OT.  No home health therapies needed at this time.  Prolonged QTc - ECG noting QTc 504 on presentation.  Now since resolved.  Keep potassium greater than 4, magnesium  greater than 2.    Subjective: Patient resting comfortably this morning.  Denies any fever, chills, chest pain, nausea, vomiting, abdominal pain.  Admits that her urgency  of urination has markedly improved since presentation.  Eager for discharge home tomorrow.  Physical Exam:  Vitals:   02/02/24 2141 02/03/24 0328 02/03/24 0831 02/03/24 0940  BP: (!) 186/63 (!) 124/42  (!) 158/61  Pulse: 70 63  70  Resp: 17 18    Temp: 97.6 F (36.4 C) 97.7 F (36.5 C)    TempSrc: Oral Oral    SpO2: 97% 97% 99% 94%  Weight:      Height:        GENERAL:  Alert, pleasant, no acute distress  HEENT:  EOMI CARDIOVASCULAR:  RRR, no murmurs appreciated RESPIRATORY:  Clear to auscultation, no wheezing, rales, or rhonchi GASTROINTESTINAL:  Soft, nontender, nondistended EXTREMITIES:  No LE edema bilaterally NEURO:  No new focal deficits appreciated SKIN:  No rashes noted PSYCH:  Appropriate mood and affect     Data Reviewed:  No new imaging review  Previous records (including but not limited to H&P, progress notes, nursing notes, TOC management) were reviewed in assessment of this patient.  Labs: CBC: Recent Labs  Lab 01/28/24 2153 01/29/24 0405 01/30/24 0434 01/31/24 0418 02/01/24 0432 02/02/24 0431 02/03/24 0442  WBC 6.0   < > 5.5 6.6 7.7 7.8 7.1  NEUTROABS 4.6  --   --   --   --  5.8  --   HGB 11.3*   < > 10.1* 10.3* 9.7* 9.7* 9.0*  HCT 37.0   < > 33.4* 33.3* 31.6* 32.5* 30.3*  MCV 93.9   < > 95.2 93.8 95.5 97.6 97.7  PLT 212   < > 186 205 189 217 195   < > = values in this interval not displayed.   Basic Metabolic Panel: Recent Labs  Lab 01/29/24 0405 01/30/24 0434  01/31/24 0418 02/01/24 0432 02/02/24 0431 02/03/24 0442  NA 138 136 135 135 136 136  K 3.8 4.6 4.8 4.9 4.3 4.5  CL 105 103 101 103 103 106  CO2 26 26 23 24 24 24   GLUCOSE 156* 114* 182* 112* 111* 111*  BUN 39* 45* 51* 50* 43* 36*  CREATININE 1.52* 1.59* 1.74* 1.67* 1.41* 1.29*  CALCIUM  8.9 8.8* 9.4 8.7* 9.1 8.8*  MG 2.2  --  2.1  --   --   --   PHOS  --   --  2.8  --   --   --    Liver Function Tests: Recent Labs  Lab 01/28/24 2153 01/31/24 0418  AST 18  --   ALT  16  --   ALKPHOS 102  --   BILITOT 0.5  --   PROT 7.8  --   ALBUMIN 3.8 3.5   CBG: No results for input(s): GLUCAP in the last 168 hours.  Scheduled Meds:  aspirin  EC  81 mg Oral QHS   atorvastatin   80 mg Oral q1800   budesonide -glycopyrrolate -formoterol   2 puff Inhalation BID   heparin   5,000 Units Subcutaneous Q8H   levothyroxine   175 mcg Oral Q0600   lisinopril   10 mg Oral Daily   metoprolol  succinate  25 mg Oral Daily   sodium chloride  flush  3 mL Intravenous Q12H   sodium chloride  flush  3 mL Intravenous Q12H   Continuous Infusions:  cefTAZidime (FORTAZ)  IV 1 g (02/03/24 1023)   PRN Meds:.acetaminophen  **OR** acetaminophen , ipratropium-albuterol , sodium chloride  flush  Family Communication: None at bedside  Disposition: Status is: Inpatient Remains inpatient appropriate because: UTI requiring IV antibiotics     Time spent: 34 minutes  Length of inpatient stay: 5 days  Author: Jodeane Mulligan, DO 02/03/2024 1:12 PM  For on call review [email or web address].

## 2024-02-03 NOTE — Hospital Course (Signed)
 88 year old female history of hypertension, hyperlipidemia, CAD, carotid disease, history of non-small cell lung cancer and adenocarcinoma status postchemotherapy and immunotherapy, CKD stage IIIb, hypothyroidism, COPD and chronic hypoxic respiratory failure on 3 L O2 presented to the ED with complaints of UTI. Patient noted to have received 2 rounds of oral antibiotics however still with ongoing symptoms. Patient noted to have had recent urine culture grew Pseudomonas. Patient placed on oral ciprofloxacin  however noted to have failed outpatient treatment and patient admitted for IV antibiotics.    Assessment and Plan:   Acute cystitis secondary to Pseudomonas - Culture showing Pseudomonas aeruginosa resistant to ciprofloxacin .  Initially on Zosyn , transition to ceftazidime.  Infectious disease recommending 7-day course (today day 6 of 7).  CT scan noting nonobstructive 1.2 cm left nephrolithiasis, likely etiology of patient's chronic UTI.  May warrant urology management in the outpatient setting.  ID signed off.  Anticipate discharge tomorrow.   Hypertension/hyperlipidemia/hypothyroidism - Resume home medication regimen.   CKD 3B - Currently stable.  Will recheck BMP in AM.   CAD - Aspirin , Toprol -XL, Lipitor  on board.   COPD with chronic hypoxic respiratory failure - Currently on 3 L nasal cannula, patient's baseline.  Nebulizers on board.   Physical debilitation muscle weakness - Evaluated by PT/OT.  No home health therapies needed at this time.   Prolonged QTc - ECG noting QTc 504 on presentation.  Now since resolved.  Keep potassium greater than 4, magnesium  greater than 2.

## 2024-02-03 NOTE — Plan of Care (Signed)
  Problem: Education: Goal: Knowledge of General Education information will improve Description: Including pain rating scale, medication(s)/side effects and non-pharmacologic comfort measures Outcome: Progressing   Problem: Clinical Measurements: Goal: Will remain free from infection Outcome: Progressing   Problem: Elimination: Goal: Will not experience complications related to bowel motility Outcome: Progressing Goal: Will not experience complications related to urinary retention Outcome: Progressing   Problem: Pain Managment: Goal: General experience of comfort will improve and/or be controlled Outcome: Progressing   Problem: Safety: Goal: Ability to remain free from injury will improve Outcome: Progressing

## 2024-02-03 NOTE — Progress Notes (Signed)
 Pharmacy Antibiotic Note  Abigail [name] is a 88 y.o. female admitted on 01/28/2024 with UTI.  Pharmacy has been consulted for ceftazidime dosing.  Plan: Increase Ceftazidime to 1 gm IV q12 hours Stop date 6/12 to complete 7 day course of abx for PsA UTI   Height: 5' 6 (167.6 cm) Weight: 75.7 kg (166 lb 12.8 oz) IBW/kg (Calculated) : 59.3  Temp (24hrs), Avg:97.8 F (36.6 C), Min:97.6 F (36.4 C), Max:98.2 F (36.8 C)  Recent Labs  Lab 01/30/24 0434 01/31/24 0418 02/01/24 0432 02/02/24 0431 02/03/24 0442  WBC 5.5 6.6 7.7 7.8 7.1  CREATININE 1.59* 1.74* 1.67* 1.41* 1.29*    Estimated Creatinine Clearance: 33.2 mL/min (A) (by C-G formula based on SCr of 1.29 mg/dL (H)).    Allergies  Allergen Reactions   Codeine Nausea And Vomiting   Norvasc  [Amlodipine ] Rash and Other (See Comments)    rash    Antimicrobials this admission: 6/6 Zosyn > 6/8 6/8 ceftazidime>> Dose adjustments this admission:  Microbiology results: 6/5 UCx: 70 K Pseudomonas aeruginosa: R to cipro /intermediate to gent; sensitive to ceftaz/imi/pip/tazo  Thank you for allowing pharmacy to be a part of this patient's care.  Rubie Corona, Pharm.D Use secure chat for questions 02/03/2024 7:27 AM

## 2024-02-03 NOTE — Plan of Care (Signed)

## 2024-02-04 LAB — BASIC METABOLIC PANEL WITH GFR
Anion gap: 9 (ref 5–15)
BUN: 34 mg/dL — ABNORMAL HIGH (ref 8–23)
CO2: 23 mmol/L (ref 22–32)
Calcium: 8.9 mg/dL (ref 8.9–10.3)
Chloride: 102 mmol/L (ref 98–111)
Creatinine, Ser: 1.39 mg/dL — ABNORMAL HIGH (ref 0.44–1.00)
GFR, Estimated: 37 mL/min — ABNORMAL LOW (ref >60)
Glucose, Bld: 118 mg/dL — ABNORMAL HIGH (ref 70–99)
Potassium: 4.4 mmol/L (ref 3.5–5.1)
Sodium: 134 mmol/L — ABNORMAL LOW (ref 135–145)

## 2024-02-04 LAB — CBC
HCT: 30 % — ABNORMAL LOW (ref 36.0–46.0)
Hemoglobin: 9.1 g/dL — ABNORMAL LOW (ref 12.0–15.0)
MCH: 28.8 pg (ref 26.0–34.0)
MCHC: 30.3 g/dL (ref 30.0–36.0)
MCV: 94.9 fL (ref 80.0–100.0)
Platelets: 196 10*3/uL (ref 150–400)
RBC: 3.16 MIL/uL — ABNORMAL LOW (ref 3.87–5.11)
RDW: 15 % (ref 11.5–15.5)
WBC: 6.7 10*3/uL (ref 4.0–10.5)
nRBC: 0 % (ref 0.0–0.2)

## 2024-02-04 LAB — MAGNESIUM: Magnesium: 2.2 mg/dL (ref 1.7–2.4)

## 2024-02-04 MED ORDER — METOPROLOL SUCCINATE ER 25 MG PO TB24: 25.0000 mg | ORAL_TABLET | Freq: Every day | ORAL | 0 refills | Status: DC

## 2024-02-04 NOTE — Discharge Summary (Signed)
 Physician Discharge Summary   Patient: Abigail [name] MRN: [id number] DOB: 04-11-1936  Admit date:     01/28/2024  Discharge date: 02/04/24  Discharge Physician: Jodeane Mulligan   PCP: [name], FNP   Recommendations at discharge:    Pt to be discharged home.   If you experience worsening fever, chills, chest pain, shortness of breath, or other concerning symptoms, please call your PCP or go to the emergency department immediately.  Discharge Diagnoses: Principal Problem:   Acute cystitis Active Problems:   Hyperlipidemia   Hypothyroidism   CKD (chronic kidney disease), stage III (HCC)   Essential hypertension   H/O: lung cancer   Chronic hypoxic respiratory failure (HCC)   History of CAD (coronary artery disease)   History of COPD   Chronic weakness of both lower extremities   QT prolongation  Resolved Problems:   * No resolved hospital problems. *   Hospital Course:  88 year old female history of hypertension, hyperlipidemia, CAD, carotid disease, history of non-small cell lung cancer and adenocarcinoma status postchemotherapy and immunotherapy, CKD stage IIIb, hypothyroidism, COPD and chronic hypoxic respiratory failure on 3 L O2 presented to the ED with complaints of UTI. Patient noted to have received 2 rounds of oral antibiotics however still with ongoing symptoms. Patient noted to have had recent urine culture grew Pseudomonas. Patient placed on oral ciprofloxacin  however noted to have failed outpatient treatment and patient admitted for IV antibiotics.    Assessment and Plan:   Acute cystitis secondary to Pseudomonas - Culture showing Pseudomonas aeruginosa resistant to ciprofloxacin .  Initially on Zosyn , transition to ceftazidime.  Completed 7-day course.  CT scan noting nonobstructive 1.2 cm left nephrolithiasis, likely etiology of patient's chronic UTI.  May warrant continued urology management in the outpatient setting.  Will discontinue any other oral antibiotics  upon discharge.  Recommend patient follow-up with urology in the outpatient setting.   Hypertension/hyperlipidemia/hypothyroidism - Resume home medication regimen.   CKD 3B - Currently stable.    CAD - Aspirin , Toprol -XL, Lipitor  on board.   COPD with chronic hypoxic respiratory failure - Currently on 3 L nasal cannula, patient's baseline.     Physical debilitation muscle weakness - Evaluated by PT/OT.  No home health therapies needed at this time.   Prolonged QTc - ECG noting QTc 504 on presentation.  Now since resolved.  Keep potassium greater than 4, magnesium  greater than 2.   Consultants: Infectious disease Procedures performed: None Disposition: Home Diet recommendation:  Discharge Diet Orders (From admission, onward)     Start     Ordered   02/04/24 0000  Diet - low sodium heart healthy        02/04/24 1013           Cardiac diet  DISCHARGE MEDICATION: Allergies as of 02/04/2024       Reactions   Codeine Nausea And Vomiting   Norvasc  [amlodipine ] Rash, Other (See Comments)   rash        Medication List     STOP taking these medications    ezetimibe  10 MG tablet Commonly known as: ZETIA    nitrofurantoin  100 MG capsule Commonly known as: MACRODANTIN        TAKE these medications    albuterol  108 (90 Base) MCG/ACT inhaler Commonly known as: VENTOLIN  HFA TAKE 2 PUFFS BY MOUTH EVERY 6 HOURS AS NEEDED FOR WHEEZE OR SHORTNESS OF BREATH   aspirin  EC 81 MG tablet Take 81 mg by mouth at bedtime.   atorvastatin  80 MG  tablet Commonly known as: LIPITOR  Take 1 tablet (80 mg total) by mouth daily at 6 PM.   Breztri  Aerosphere 160-9-4.8 MCG/ACT Aero inhaler Generic drug: budesonide -glycopyrrolate -formoterol  INHALE 2 PUFFS INTO THE LUNGS IN THE MORNING AND ALSO 2 PUFFS AT BEDTIME   estradiol  0.1 MG/GM vaginal cream Commonly known as: ESTRACE  Place 0.5g twice a week at opening of vagina   ferrous sulfate  325 (65 FE) MG EC tablet TAKE 1 TABLET BY  MOUTH EVERY DAY WITH BREAKFAST   levothyroxine  175 MCG tablet Commonly known as: SYNTHROID  TAKE 1 TABLET BY MOUTH EVERY MORNING ON MONDAY - FRIDAY What changed:  how much to take how to take this when to take this additional instructions Another medication with the same name was removed. Continue taking this medication, and follow the directions you see here.   lisinopril  10 MG tablet Commonly known as: ZESTRIL  TAKE 1 TABLET BY MOUTH EVERY DAY   metoprolol  succinate 25 MG 24 hr tablet Commonly known as: TOPROL -XL Take 1 tablet (25 mg total) by mouth daily. Start taking on: February 05, 2024 What changed:  medication strength how much to take additional instructions   nitroGLYCERIN  0.4 MG SL tablet Commonly known as: NITROSTAT  PLACE 1 TABLET UNDER THE TONGUE EVERY 5 (FIVE) MINUTES X 3 DOSES AS NEEDED FOR CHEST PAIN.   OXYGEN  Inhale 3 L into the lungs continuous.   trospium  20 MG tablet Commonly known as: SANCTURA  Take 1 tablet (20 mg total) by mouth 2 (two) times daily.         Discharge Exam: Filed Weights   02/01/24 0836 02/02/24 0700 02/04/24 0715  Weight: 77.2 kg 75.7 kg 76 kg    GENERAL:  Alert, pleasant, no acute distress  HEENT:  EOMI CARDIOVASCULAR:  RRR, no murmurs appreciated RESPIRATORY:  Clear to auscultation, no wheezing, rales, or rhonchi GASTROINTESTINAL:  Soft, nontender, nondistended EXTREMITIES:  No LE edema bilaterally NEURO:  No new focal deficits appreciated SKIN:  No rashes noted PSYCH:  Appropriate mood and affect     Condition at discharge: improving  The results of significant diagnostics from this hospitalization (including imaging, microbiology, ancillary and laboratory) are listed below for reference.   Imaging Studies: CT RENAL STONE STUDY Result Date: 01/30/2024 CLINICAL DATA:  recurrent UTI EXAM: CT ABDOMEN AND PELVIS WITHOUT CONTRAST TECHNIQUE: Multidetector CT imaging of the abdomen and pelvis was performed following the  standard protocol without IV contrast. RADIATION DOSE REDUCTION: This exam was performed according to the departmental dose-optimization program which includes automated exposure control, adjustment of the mA and/or kV according to patient size and/or use of iterative reconstruction technique. COMPARISON:  MRI abdomen 09/27/2022, CT abdomen pelvis 10/29/2023 FINDINGS: Lower chest: Partially visualized moderate to large hiatal hernia. Severe atherosclerotic plaque. Coronary artery calcification. Hepatobiliary: No focal liver abnormality. No gallstones, gallbladder wall thickening, or pericholecystic fluid. No biliary dilatation. Pancreas: Diffusely atrophic. No focal lesion. Otherwise normal pancreatic contour. No surrounding inflammatory changes. No main pancreatic ductal dilatation. Spleen: Normal in size without focal abnormality. Adrenals/Urinary Tract: No adrenal nodule bilaterally. No hydronephrosis. 1.2 cm left nephrolithiasis. Urothelial thickening on the left. No right nephrolithiasis. Fluid density lesions suggestive of simple renal cysts. Simple renal cysts, in the absence of clinically indicated signs/symptoms, require no independent follow-up. Subcentimeter hyperdensity lesions are too small to characterize-no further follow-up indicated, likely proteinaceous or hemorrhagic cyst. No ureterolithiasis or hydroureter. The urinary bladder is unremarkable. Stomach/Bowel: Stomach is within normal limits. No evidence of bowel wall thickening or dilatation. Colonic diverticulosis appendix appears  normal. Vascular/Lymphatic: No abdominal aorta or iliac aneurysm. Severe atherosclerotic plaque of the aorta and its branches. No abdominal, pelvic, or inguinal lymphadenopathy. Reproductive: Uterus and bilateral adnexa are unremarkable. Other: No intraperitoneal free fluid. No intraperitoneal free gas. No organized fluid collection. Musculoskeletal: Tiny fat containing umbilical hernia. No suspicious lytic or blastic  osseous lesions. No acute displaced fracture. Multilevel severe degenerative changes of the spine. IMPRESSION: 1. Left urothelial thickening suggestive of infection. Limited evaluation on this noncontrast study. Correlate with urinalysis. 2. Nonobstructive 1.2 cm left nephrolithiasis. 3. Partially visualized moderate to large volume hiatal hernia. 4. Colonic diverticulosis with no acute diverticulitis. Electronically Signed   By: Morgane  Naveau M.D.   On: 01/30/2024 10:16   DG Chest 2 View Result Date: 01/28/2024 CLINICAL DATA:  The EXAM: CHEST - 2 VIEW COMPARISON:  Chest x-ray 10/11/2023 FINDINGS: The heart is enlarged. There central pulmonary vascular congestion and patchy perihilar opacity. There is no pleural effusion or pneumothorax. No acute fractures are seen. IMPRESSION: Cardiomegaly with central pulmonary vascular congestion and patchy perihilar opacity, likely pulmonary edema. Electronically Signed   By: Tyron Gallon M.D.   On: 01/28/2024 21:51    Microbiology: Results for orders placed or performed during the hospital encounter of 01/28/24  Urine Culture     Status: Abnormal   Collection Time: 01/28/24  8:09 PM   Specimen: Urine, Clean Catch  Result Value Ref Range Status   Specimen Description   Final    URINE, CLEAN CATCH Performed at [facility], [address], [place], [place] [address]    Special Requests   Final    NONE Performed at [facility], [address], [place], [place] [address]    Culture 70,000 COLONIES/mL PSEUDOMONAS AERUGINOSA (A)  Final   Report Status 01/30/2024 FINAL  Final   Organism ID, Bacteria PSEUDOMONAS AERUGINOSA (A)  Final      Susceptibility   Pseudomonas aeruginosa - MIC*    CEFTAZIDIME 4 SENSITIVE Sensitive     CIPROFLOXACIN  2 RESISTANT Resistant     GENTAMICIN 8 INTERMEDIATE Intermediate     IMIPENEM 2 SENSITIVE Sensitive     PIP/TAZO 8 SENSITIVE Sensitive ug/mL    * 70,000 COLONIES/mL PSEUDOMONAS  AERUGINOSA    Labs: CBC: Recent Labs  Lab 01/28/24 2153 01/29/24 0405 01/31/24 0418 02/01/24 0432 02/02/24 0431 02/03/24 0442 02/04/24 0439  WBC 6.0   < > 6.6 7.7 7.8 7.1 6.7  NEUTROABS 4.6  --   --   --  5.8  --   --   HGB 11.3*   < > 10.3* 9.7* 9.7* 9.0* 9.1*  HCT 37.0   < > 33.3* 31.6* 32.5* 30.3* 30.0*  MCV 93.9   < > 93.8 95.5 97.6 97.7 94.9  PLT 212   < > 205 189 217 195 196   < > = values in this interval not displayed.   Basic Metabolic Panel: Recent Labs  Lab 01/29/24 0405 01/30/24 0434 01/31/24 0418 02/01/24 0432 02/02/24 0431 02/03/24 0442 02/04/24 0439  NA 138   < > 135 135 136 136 134*  K 3.8   < > 4.8 4.9 4.3 4.5 4.4  CL 105   < > 101 103 103 106 102  CO2 26   < > 23 24 24 24 23   GLUCOSE 156*   < > 182* 112* 111* 111* 118*  BUN 39*   < > 51* 50* 43* 36* 34*  CREATININE 1.52*   < > 1.74*  1.67* 1.41* 1.29* 1.39*  CALCIUM  8.9   < > 9.4 8.7* 9.1 8.8* 8.9  MG 2.2  --  2.1  --   --   --  2.2  PHOS  --   --  2.8  --   --   --   --    < > = values in this interval not displayed.   Liver Function Tests: Recent Labs  Lab 01/28/24 2153 01/31/24 0418  AST 18  --   ALT 16  --   ALKPHOS 102  --   BILITOT 0.5  --   PROT 7.8  --   ALBUMIN 3.8 3.5   CBG: No results for input(s): GLUCAP in the last 168 hours.  Discharge time spent: 25 minutes.  Length of inpatient stay: 6 days  Signed: Jodeane Mulligan, DO [facility] 02/04/2024

## 2024-02-04 NOTE — Care Management Important Message (Signed)
 Important Message  Patient Details IM Letter given to Patient. Name: Abigail [name] MRN: [id number] Date of Birth: [birth year]   Important Message Given:  Yes - Medicare IM     Curtiss Dowdy 02/04/2024, 12:08 PM

## 2024-02-04 NOTE — Progress Notes (Signed)
 Discharge orders received however, the patients transportation will not arrive until around noon. Charge nurse and MD aware.

## 2024-02-10 ENCOUNTER — Other Ambulatory Visit: Payer: Self-pay

## 2024-02-10 ENCOUNTER — Telehealth: Payer: Self-pay

## 2024-02-10 NOTE — Patient Outreach (Signed)
 Complex Care Management   Visit Note  02/10/2024  Name:  Abigail [name] MRN: [id number] DOB: August 29, 1935  Situation: Referral received for Complex Care Management related to  Hypertensive heart and Renal disease, Recurrent UTI, Chronic Kidney Disease, Stage 3b, Hypothyroidism, Malignant neoplasm of unspecified part of unspecified bronchus or lung. I obtained verbal consent from Patient.  Visit completed with patient on the phone. Background:   Past Medical History:  Diagnosis Date   Anemia    Arthritis    knees, back   Bacteremia 04/08/2012   CAP (community acquired pneumonia) 04/12/2012   Carotid artery occlusion    Chronic kidney disease    stage 3 ckd no nephrologist   Complete uterine prolapse with prolapse of anterior vaginal wall    Complication of anesthesia    hard to wake up per pt   Constipation    Coronary artery disease    Diverticulitis yrs ago coialitis   Dyspnea    History of blood transfusion    History of radiation therapy    right lung 08/05/2021-09/18/2021  Dr Retta Caster   Hypertension    Hypothyroid    LLQ abdominal pain 04/05/2012   Lung cancer (HCC)    Numbness    in hands at times   Pneumonia    Pre-diabetes    Scoliosis    STEMI (ST elevation myocardial infarction) (HCC) 10/26/2019   DES RCA   Wears dentures    full dentures   Wears glasses    for reading    Assessment: Patient Reported Symptoms:  Cognitive Cognitive Status: Alert and oriented to person, place, and time Cognitive/Intellectual Conditions Management [RPT]: None reported or documented in medical history or problem list   Health Maintenance Behaviors: Annual physical exam, Stress management, Exercise, Healthy diet Health Facilitated by: Rest, Healthy diet  Neurological Neurological Review of Symptoms: No symptoms reported    HEENT HEENT Symptoms Reported: No symptoms reported      Cardiovascular Cardiovascular Symptoms Reported: No symptoms reported    Respiratory  Respiratory Symptoms Reported: Shortness of breath Respiratory Conditions: Shortness of breath Respiratory Self-Management Outcome: 4 (good) Respiratory Comment: patient is wearing continuous Oxygen  at 3 lts  Endocrine Patient reports the following symptoms related to hypoglycemia or hyperglycemia : No symptoms reported Is patient diabetic?: Yes Is patient checking blood sugars at home?: No Endocrine Conditions: Thyroid  disorder, Diabetes Endocrine Management Strategies: Medication therapy, Diet modification, Routine screening Endocrine Self-Management Outcome: 4 (good)  Gastrointestinal Gastrointestinal Symptoms Reported: No symptoms reported      Genitourinary Genitourinary Symptoms Reported: Frequency Additional Genitourinary Details: s/p hospital admission to treatment of UTI Genitourinary Conditions: Chronic kidney disease, Urinary tract infection Genitourinary Management Strategies: Medication therapy (Routine Screening) Genitourinary Self-Management Outcome: 3 (uncertain) Genitourinary Comment: collaboration with PCP regarding post hospital follow up and urine recheck  Integumentary Integumentary Symptoms Reported: No symptoms reported    Musculoskeletal Musculoskelatal Symptoms Reviewed: Weakness Additional Musculoskeletal Details: patient is using a walker for ambulation Musculoskeletal Management Strategies: Medical device Falls in the past year?: Yes Number of falls in past year: 1 or less Was there an injury with Fall?: Yes Fall Risk Category Calculator: 2 Patient Fall Risk Level: Moderate Fall Risk Patient at Risk for Falls Due to: History of fall(s), Impaired mobility, Impaired balance/gait Fall risk Follow up: Falls evaluation completed, Education provided  Psychosocial Psychosocial Symptoms Reported: No symptoms reported   Major Change/Loss/Stressor/Fears (CP): Relationship concerns Techniques to Cope with Loss/Stress/Change: Diversional activities Quality of Family  Relationships: supportive  02/10/2024    9:43 AM  Depression screen PHQ 2/9  Decreased Interest 0  Down, Depressed, Hopeless 0  PHQ - 2 Score 0    Vitals:   02/10/24 0926  BP: (!) 120/50  Pulse: 74  SpO2: 96%    Medications Reviewed Today     Reviewed by Kaylene Pascal, RN (Registered Nurse) on 02/10/24 at [phone]  Med List Status: <None>   Medication Order Taking? Sig Documenting Provider Last Dose Status Informant  albuterol  (VENTOLIN  HFA) 108 (90 Base) MCG/ACT inhaler [id number]  TAKE 2 PUFFS BY MOUTH EVERY 6 HOURS AS NEEDED FOR WHEEZE OR SHORTNESS OF BREATH Heilingoetter, Cassandra L, PA-C  Active Self  aspirin  EC 81 MG tablet [id number]  Take 81 mg by mouth at bedtime. [provider]  Active Self  atorvastatin  (LIPITOR ) 80 MG tablet [id number]  Take 1 tablet (80 mg total) by mouth daily at 6 PM. Patwardhan, Kaye Parsons, MD  Active Self  BREZTRI  AEROSPHERE 160-9-4.8 MCG/ACT AERO inhaler [id number]  INHALE 2 PUFFS INTO THE LUNGS IN THE MORNING AND ALSO 2 PUFFS AT BEDTIME Denson Flake, MD  Active Self  estradiol  (ESTRACE ) 0.1 MG/GM vaginal cream [id number]  Place 0.5g twice a week at opening of vagina  Patient not taking: Reported on 12/24/2023   Arma Lamp, MD  Active Self  ferrous sulfate  325 (65 FE) MG EC tablet [id number]  TAKE 1 TABLET BY MOUTH EVERY DAY WITH BREAKFAST Heilingoetter, Cassandra L, PA-C  Active Self  levothyroxine  (SYNTHROID ) 175 MCG tablet [id number]  TAKE 1 TABLET BY MOUTH EVERY MORNING ON MONDAY - FRIDAY  Patient taking differently: Take 175 mcg by mouth daily before breakfast.   [name], FNP  Active Self  lisinopril  (ZESTRIL ) 10 MG tablet [id number]  TAKE 1 TABLET BY MOUTH EVERY DAY [name], FNP  Active Self  metoprolol  succinate (TOPROL -XL) 25 MG 24 hr tablet [id number]  Take 1 tablet (25 mg total) by mouth daily. Jodeane Mulligan, DO  Active   nitroGLYCERIN  (NITROSTAT ) 0.4 MG SL tablet [id number]  PLACE 1 TABLET UNDER THE TONGUE  EVERY 5 (FIVE) MINUTES X 3 DOSES AS NEEDED FOR CHEST PAIN. [name], FNP  Active Self           Med Note Andrea Kay Oct 11, 2023 12:10 PM)    OXYGEN  [id number]  Inhale 3 L into the lungs continuous. [provider]  Active Self  trospium  (SANCTURA ) 20 MG tablet [id number]  Take 1 tablet (20 mg total) by mouth 2 (two) times daily. Arma Lamp, MD  Active Self            Recommendation:   PCP Follow-up for post hospital follow up and urine recheck as directed   Follow Up Plan:   Telephone follow up appointment date/time:  Thursday, July 24 at 09:30 AM  Louanne Roussel RN BSN CCM [place]  [facility], [facility] Health Nurse Care Coordinator  Direct Dial: [phone] Website: [email or web address]

## 2024-02-10 NOTE — Transitions of Care (Post Inpatient/ED Visit) (Signed)
   02/10/2024  Name: Abigail [name] MRN: [id number] DOB: Oct 01, 1935  Today's TOC FU Call Status: Today's TOC FU Call Status:: Unsuccessful Call (1st Attempt) Unsuccessful Call (1st Attempt) Date: 02/10/24  Attempted to reach the patient regarding the most recent Inpatient/ED visit.  Follow Up Plan: Additional outreach attempts will be made to reach the patient to complete the Transitions of Care (Post Inpatient/ED visit) call.   Signature YL,RMA

## 2024-02-11 NOTE — Patient Instructions (Signed)
 Visit Information  Thank you for taking time to visit with me today. Please don't hesitate to contact me if I can be of assistance to you before our next scheduled appointment.  Your next care management appointment is by telephone on Thursday, July 24 at 09:30 AM  Please call the care guide team at [phone] if you need to cancel, schedule, or reschedule an appointment.   Please call [phone]-TALK (toll free, 24 hour hotline) if you are experiencing a Mental Health or Behavioral Health Crisis or need someone to talk to.  Louanne Roussel RN BSN CCM [place]  [facility], [facility] Health Nurse Care Coordinator  Direct Dial: [phone] Website: [email or web address]

## 2024-02-17 ENCOUNTER — Ambulatory Visit: Payer: Self-pay | Admitting: Nurse Practitioner

## 2024-02-17 ENCOUNTER — Encounter: Payer: Self-pay | Admitting: Nurse Practitioner

## 2024-02-17 VITALS — BP 120/72 | HR 76 | Temp 98.5°F | Ht 66.0 in | Wt 159.0 lb

## 2024-02-17 DIAGNOSIS — N1832 Chronic kidney disease, stage 3b: Secondary | ICD-10-CM

## 2024-02-17 DIAGNOSIS — I5033 Acute on chronic diastolic (congestive) heart failure: Secondary | ICD-10-CM

## 2024-02-17 DIAGNOSIS — N3000 Acute cystitis without hematuria: Secondary | ICD-10-CM | POA: Diagnosis not present

## 2024-02-17 DIAGNOSIS — I130 Hypertensive heart and chronic kidney disease with heart failure and stage 1 through stage 4 chronic kidney disease, or unspecified chronic kidney disease: Secondary | ICD-10-CM | POA: Diagnosis not present

## 2024-02-17 LAB — POCT URINALYSIS DIP (CLINITEK)
Bilirubin, UA: NEGATIVE
Glucose, UA: NEGATIVE mg/dL
Ketones, POC UA: NEGATIVE mg/dL
Nitrite, UA: POSITIVE — AB
POC PROTEIN,UA: 30 — AB
Spec Grav, UA: 1.01 (ref 1.010–1.025)
Urobilinogen, UA: 0.2 U/dL
pH, UA: 5.5 (ref 5.0–8.0)

## 2024-02-17 NOTE — Progress Notes (Signed)
 I,Abigail [name], CMA,acting as a Neurosurgeon for [employer], FNP.,have documented all relevant documentation on the behalf of [name], FNP,as directed by  [name], FNP while in the presence of [name], FNP.  Discussed the use of AI scribe software for clinical note transcription with the patient, who gave verbal consent to proceed.   Subjective:  Patient ID: Abigail [name] , female    DOB: [birth year] , 88 y.o.   MRN: [id number]  Chief Complaint  Patient presents with   hospital f/u    Patient presents today for a hospital f/u.     HPI  M.B. is an 88 year old female who was recently hospitalized from June 5-12 for a urinary tract infection. She had previously been treated with two rounds of oral antibiotics without improvement. Urine culture showed pseudomonas resistant to Cipro . She received IV antibiotics in the hospital, initially Zosyn  then switched to ceftazidime  for 7 days. Since discharge, she reports some improvement but still feels urinary pressure, especially in the last couple days. She is trying to stay hydrated, drinking about 72 ounces of water daily. She denies taking cranberry tablets. A kidney stone was noted on imaging. She has a urology follow-up scheduled for next week.     Past Medical History:  Diagnosis Date   Anemia    Arthritis    knees, back   Bacteremia 04/08/2012   CAP (community acquired pneumonia) 04/12/2012   Carotid artery occlusion    Chronic kidney disease    stage 3 ckd no nephrologist   Complete uterine prolapse with prolapse of anterior vaginal wall    Complication of anesthesia    hard to wake up per pt   Constipation    Coronary artery disease    Diverticulitis yrs ago coialitis   Dyspnea    History of blood transfusion    History of radiation therapy    right lung 08/05/2021-09/18/2021  Dr Lynwood Nasuti   Hypertension    Hypothyroid    LLQ abdominal pain 04/05/2012   Lung cancer (HCC)    Numbness    in hands at times    Pneumonia    Pre-diabetes    Scoliosis    STEMI (ST elevation myocardial infarction) (HCC) 10/26/2019   DES RCA   Wears dentures    full dentures   Wears glasses    for reading     Family History  Problem Relation Age of Onset   Hypertension Mother    Colon cancer Neg Hx    Stomach cancer Neg Hx    Esophageal cancer Neg Hx      Current Outpatient Medications:    albuterol  (VENTOLIN  HFA) 108 (90 Base) MCG/ACT inhaler, TAKE 2 PUFFS BY MOUTH EVERY 6 HOURS AS NEEDED FOR WHEEZE OR SHORTNESS OF BREATH, Disp: 8.5 each, Rfl: 1   aspirin  EC 81 MG tablet, Take 81 mg by mouth at bedtime., Disp: , Rfl:    atorvastatin  (LIPITOR ) 80 MG tablet, Take 1 tablet (80 mg total) by mouth daily at 6 PM., Disp: 90 tablet, Rfl: 3   BREZTRI  AEROSPHERE 160-9-4.8 MCG/ACT AERO inhaler, INHALE 2 PUFFS INTO THE LUNGS IN THE MORNING AND ALSO 2 PUFFS AT BEDTIME, Disp: 10.7 each, Rfl: 6   estradiol  (ESTRACE ) 0.1 MG/GM vaginal cream, Place 0.5g twice a week at opening of vagina, Disp: 42.5 g, Rfl: 11   ferrous sulfate  325 (65 FE) MG EC tablet, TAKE 1 TABLET BY MOUTH EVERY DAY WITH BREAKFAST, Disp: 30 tablet, Rfl: 2  levothyroxine  (SYNTHROID ) 175 MCG tablet, TAKE 1 TABLET BY MOUTH EVERY MORNING ON MONDAY - FRIDAY, Disp: 90 tablet, Rfl: 1   lisinopril  (ZESTRIL ) 10 MG tablet, TAKE 1 TABLET BY MOUTH EVERY DAY, Disp: 90 tablet, Rfl: 1   metoprolol  succinate (TOPROL -XL) 25 MG 24 hr tablet, Take 1 tablet (25 mg total) by mouth daily., Disp: 90 tablet, Rfl: 0   nitroGLYCERIN  (NITROSTAT ) 0.4 MG SL tablet, PLACE 1 TABLET UNDER THE TONGUE EVERY 5 (FIVE) MINUTES X 3 DOSES AS NEEDED FOR CHEST PAIN., Disp: 25 tablet, Rfl: 3   OXYGEN , Inhale 3 L into the lungs continuous., Disp: , Rfl:    trospium  (SANCTURA ) 20 MG tablet, Take 1 tablet (20 mg total) by mouth 2 (two) times daily., Disp: 180 tablet, Rfl: 3   Allergies  Allergen Reactions   Codeine Nausea And Vomiting   Norvasc  [Amlodipine ] Rash and Other (See Comments)    rash      Review of Systems  Constitutional: Negative.   Respiratory: Negative.    Cardiovascular: Negative.   Neurological: Negative.   Psychiatric/Behavioral: Negative.       Today's Vitals   02/17/24 1049  BP: 120/72  Pulse: 76  Temp: 98.5 F (36.9 C)  TempSrc: Oral  Weight: 159 lb (72.1 kg)  Height: 5' 6 (1.676 m)  PainSc: 0-No pain   Body mass index is 25.66 kg/m.  Wt Readings from Last 3 Encounters:  02/17/24 159 lb (72.1 kg)  02/04/24 167 lb 8.8 oz (76 kg)  12/22/23 159 lb 9.6 oz (72.4 kg)    Objective:  Physical Exam Vitals and nursing note reviewed.  Constitutional:      General: She is not in acute distress.    Appearance: Normal appearance.  Cardiovascular:     Rate and Rhythm: Normal rate and regular rhythm.     Pulses: Normal pulses.     Heart sounds: Normal heart sounds. No murmur heard. Pulmonary:     Effort: Pulmonary effort is normal. No respiratory distress.     Breath sounds: Normal breath sounds. No wheezing.     Comments: Wearing supplemental oxygen  Musculoskeletal:        General: No swelling or tenderness.     Right wrist: No tenderness. Normal range of motion.     Left wrist: No tenderness. Normal range of motion.     Comments: Uses rolling walker  Skin:    General: Skin is warm and dry.  Neurological:     General: No focal deficit present.     Mental Status: She is alert and oriented to person, place, and time.     Cranial Nerves: No cranial nerve deficit.     Motor: No weakness.  Psychiatric:        Mood and Affect: Mood normal.        Behavior: Behavior normal.        Thought Content: Thought content normal.        Judgment: Judgment normal.      Assessment And Plan:  Acute cystitis without hematuria Assessment & Plan: Recent hospitalization for UTI and treated with IV antibiotics. She has nitrates in her urine will send again for a culture.   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  Continue follow-up with cardiology.  And continue current medications.  Orders: -     BMP8+eGFR    Return if symptoms worsen or fail to improve.  Patient was given opportunity to ask questions. Patient verbalized understanding of the plan and was able to repeat key elements of the plan. All questions were answered to their satisfaction.    LILLETTE [name], FNP, have reviewed all documentation for this visit. The documentation on 02/17/24 for the exam, diagnosis, procedures, and orders are all accurate and complete.   IF YOU HAVE BEEN REFERRED TO A SPECIALIST, IT MAY TAKE 1-2 WEEKS TO SCHEDULE/PROCESS THE REFERRAL. IF YOU HAVE NOT HEARD FROM US /SPECIALIST IN TWO WEEKS, PLEASE GIVE US  A CALL AT [phone] X [phone].

## 2024-02-18 ENCOUNTER — Encounter: Payer: Self-pay | Admitting: Internal Medicine

## 2024-02-18 DIAGNOSIS — J449 Chronic obstructive pulmonary disease, unspecified: Secondary | ICD-10-CM | POA: Diagnosis not present

## 2024-02-18 DIAGNOSIS — J9610 Chronic respiratory failure, unspecified whether with hypoxia or hypercapnia: Secondary | ICD-10-CM | POA: Diagnosis not present

## 2024-02-18 DIAGNOSIS — C3490 Malignant neoplasm of unspecified part of unspecified bronchus or lung: Secondary | ICD-10-CM | POA: Diagnosis not present

## 2024-02-18 LAB — CBC
Hematocrit: 29.8 % — ABNORMAL LOW (ref 34.0–46.6)
Hemoglobin: 9.1 g/dL — ABNORMAL LOW (ref 11.1–15.9)
MCH: 28.7 pg (ref 26.6–33.0)
MCHC: 30.5 g/dL — ABNORMAL LOW (ref 31.5–35.7)
MCV: 94 fL (ref 79–97)
Platelets: 359 10*3/uL (ref 150–450)
RBC: 3.17 x10E6/uL — ABNORMAL LOW (ref 3.77–5.28)
RDW: 13.6 % (ref 11.7–15.4)
WBC: 8.7 10*3/uL (ref 3.4–10.8)

## 2024-02-18 LAB — BMP8+EGFR
BUN/Creatinine Ratio: 21 (ref 12–28)
BUN: 38 mg/dL — ABNORMAL HIGH (ref 8–27)
CO2: 19 mmol/L — ABNORMAL LOW (ref 20–29)
Calcium: 9.2 mg/dL (ref 8.7–10.3)
Chloride: 98 mmol/L (ref 96–106)
Creatinine, Ser: 1.77 mg/dL — ABNORMAL HIGH (ref 0.57–1.00)
Glucose: 127 mg/dL — ABNORMAL HIGH (ref 70–99)
Potassium: 5.1 mmol/L (ref 3.5–5.2)
Sodium: 137 mmol/L (ref 134–144)
eGFR: 28 mL/min/{1.73_m2} — ABNORMAL LOW (ref >59)

## 2024-02-22 DIAGNOSIS — R350 Frequency of micturition: Secondary | ICD-10-CM | POA: Diagnosis not present

## 2024-02-23 ENCOUNTER — Telehealth: Payer: Self-pay

## 2024-02-23 LAB — URINE CULTURE

## 2024-02-23 NOTE — Telephone Encounter (Signed)
 Patient notified that [facility] will call her with treatment for her UTI.

## 2024-02-24 NOTE — Assessment & Plan Note (Signed)
 Blood pressure is well-controlled.  Continue follow-up with cardiology.  And continue current medications.

## 2024-02-24 NOTE — Assessment & Plan Note (Addendum)
 Recent hospitalization for UTI and treated with IV antibiotics. She has nitrates in her urine will send again for a culture.

## 2024-03-09 ENCOUNTER — Ambulatory Visit: Payer: Self-pay | Admitting: Nurse Practitioner

## 2024-03-09 ENCOUNTER — Ambulatory Visit

## 2024-03-09 ENCOUNTER — Ambulatory Visit: Admitting: Nurse Practitioner

## 2024-03-09 ENCOUNTER — Encounter: Payer: Self-pay | Admitting: Nurse Practitioner

## 2024-03-09 VITALS — BP 124/60 | HR 92 | Temp 97.7°F | Ht 66.0 in | Wt 157.4 lb

## 2024-03-09 VITALS — BP 124/60 | HR 92 | Temp 97.7°F | Ht 66.0 in | Wt 157.0 lb

## 2024-03-09 DIAGNOSIS — Z87440 Personal history of urinary (tract) infections: Secondary | ICD-10-CM

## 2024-03-09 DIAGNOSIS — Z0000 Encounter for general adult medical examination without abnormal findings: Secondary | ICD-10-CM | POA: Diagnosis not present

## 2024-03-09 NOTE — Patient Instructions (Signed)
 [name] , Thank you for taking time out of your busy schedule to complete your Annual Wellness Visit with me. I enjoyed our conversation and look forward to speaking with you again next year. I, as well as your care team,  appreciate your ongoing commitment to your health goals. Please review the following plan we discussed and let me know if I can assist you in the future. Your Game plan/ To Do List    Referrals: If you haven't heard from the office you've been referred to, please reach out to them at the phone provided.  N/a Follow up Visits: Next Medicare AWV with our clinical staff: office will schedule   Have you seen your provider in the last 6 months (3 months if uncontrolled diabetes)? Yes Next Office Visit with your provider: 03/09/2024 at 12:20  Clinician Recommendations:  Aim for 30 minutes of exercise or brisk walking, 6-8 glasses of water, and 5 servings of fruits and vegetables each day.       This is a list of the screening recommended for you and due dates:  Health Maintenance  Topic Date Due   COVID-19 Vaccine (6 - 2024-25 season) 03/25/2024*   Flu Shot  03/25/2024   Medicare Annual Wellness Visit  03/09/2025   DTaP/Tdap/Td vaccine (3 - Td or Tdap) 01/10/2030   Pneumococcal Vaccine for age over 35  Completed   DEXA scan (bone density measurement)  Completed   Zoster (Shingles) Vaccine  Completed   Hepatitis B Vaccine  Aged Out   HPV Vaccine  Aged Out   Meningitis B Vaccine  Aged Out  *Topic was postponed. The date shown is not the original due date.    Advanced directives: (Declined) Advance directive discussed with you today. Even though you declined this today, please call our office should you change your mind, and we can give you the proper paperwork for you to fill out. Advance Care Planning is important because it:  [x]  Makes sure you receive the medical care that is consistent with your values, goals, and preferences  [x]  It provides guidance to your family  and loved ones and reduces their decisional burden about whether or not they are making the right decisions based on your wishes.  Follow the link provided in your after visit summary or read over the paperwork we have mailed to you to help you started getting your Advance Directives in place. If you need assistance in completing these, please reach out to us  so that we can help you!  See attachments for Preventive Care and Fall Prevention Tips.

## 2024-03-09 NOTE — Progress Notes (Signed)
 Abigail [name], CMA,acting as a Neurosurgeon for [name], FNP.,have documented all relevant documentation on the behalf of [name], FNP,as directed by  [name], FNP while in the presence of [name], FNP.  Subjective:  Patient ID: Abigail [name] , female    DOB: Mar 02, 1936 , 88 y.o.   MRN: [id number]  Chief Complaint  Patient presents with   Medication Problem    Patient presents today for wanting to discuss some of her medications. She has a few concerns about her UTI medications.     HPI Discussed the use of AI scribe software for clinical note transcription with the patient, who gave verbal consent to proceed.  History of Present Illness Abigail [name] is an 88 year old with recurrent urinary tract infections who presents with medication management concerns.  She is experiencing confusion regarding the coordination between me as her PCP and the urologist, particularly concerning the prescription of fosfomycin, an antibiotic powder. Her insurance initially denied coverage for the medication, leading her to purchase it out-of-pocket for $35 and take it as directed. Subsequently, she received notice that the insurance approved it. She is unsure which doctor ordered the medication and seeks clarification.  She mentions a previous prescription for nitrofurantoin , which she was instructed to take daily, but it was ineffective. She reports that she was told to stop taking nitrofurantoin , and although it is no longer on her medication list, the pharmacy has filled it.  She recalls being hospitalized for a UTI and receiving a seven-day course of antibiotics. She has an upcoming appointment on the 22nd of July, 2025.  She expresses frustration with the scheduling and communication issues between his healthcare providers, which led to confusion about her appointments and medication management.   Past Medical History:  Diagnosis Date   Anemia    Arthritis    knees, back   Bacteremia  04/08/2012   CAP (community acquired pneumonia) 04/12/2012   Carotid artery occlusion    Chronic kidney disease    stage 3 ckd no nephrologist   Complete uterine prolapse with prolapse of anterior vaginal wall    Complication of anesthesia    hard to wake up per pt   Constipation    Coronary artery disease    Diverticulitis yrs ago coialitis   Dyspnea    History of blood transfusion    History of radiation therapy    right lung 08/05/2021-09/18/2021  Dr Lynwood Nasuti   Hypertension    Hypothyroid    LLQ abdominal pain 04/05/2012   Lung cancer (HCC)    Numbness    in hands at times   Pneumonia    Pre-diabetes    Scoliosis    STEMI (ST elevation myocardial infarction) (HCC) 10/26/2019   DES RCA   Wears dentures    full dentures   Wears glasses    for reading     Family History  Problem Relation Age of Onset   Hypertension Mother    Colon cancer Neg Hx    Stomach cancer Neg Hx    Esophageal cancer Neg Hx      Current Outpatient Medications:    albuterol  (VENTOLIN  HFA) 108 (90 Base) MCG/ACT inhaler, TAKE 2 PUFFS BY MOUTH EVERY 6 HOURS AS NEEDED FOR WHEEZE OR SHORTNESS OF BREATH, Disp: 8.5 each, Rfl: 1   aspirin  EC 81 MG tablet, Take 81 mg by mouth at bedtime., Disp: , Rfl:    atorvastatin  (LIPITOR ) 80 MG tablet, Take 1 tablet (80  mg total) by mouth daily at 6 PM., Disp: 90 tablet, Rfl: 3   BREZTRI  AEROSPHERE 160-9-4.8 MCG/ACT AERO inhaler, INHALE 2 PUFFS INTO THE LUNGS IN THE MORNING AND ALSO 2 PUFFS AT BEDTIME, Disp: 10.7 each, Rfl: 6   estradiol  (ESTRACE ) 0.1 MG/GM vaginal cream, Place 0.5g twice a week at opening of vagina, Disp: 42.5 g, Rfl: 11   levothyroxine  (SYNTHROID ) 175 MCG tablet, TAKE 1 TABLET BY MOUTH EVERY MORNING ON MONDAY - FRIDAY, Disp: 90 tablet, Rfl: 1   lisinopril  (ZESTRIL ) 10 MG tablet, TAKE 1 TABLET BY MOUTH EVERY DAY, Disp: 90 tablet, Rfl: 1   metoprolol  succinate (TOPROL -XL) 25 MG 24 hr tablet, Take 1 tablet (25 mg total) by mouth daily., Disp: 90  tablet, Rfl: 0   nitroGLYCERIN  (NITROSTAT ) 0.4 MG SL tablet, PLACE 1 TABLET UNDER THE TONGUE EVERY 5 (FIVE) MINUTES X 3 DOSES AS NEEDED FOR CHEST PAIN., Disp: 25 tablet, Rfl: 3   OXYGEN , Inhale 3 L into the lungs continuous., Disp: , Rfl:    trospium  (SANCTURA ) 20 MG tablet, Take 1 tablet (20 mg total) by mouth 2 (two) times daily., Disp: 180 tablet, Rfl: 3   ferrous sulfate  325 (65 FE) MG EC tablet, TAKE 1 TABLET BY MOUTH EVERY DAY WITH BREAKFAST, Disp: 30 tablet, Rfl: 2   nitrofurantoin  (MACRODANTIN ) 100 MG capsule, Take 100 mg by mouth daily., Disp: , Rfl:    Allergies  Allergen Reactions   Codeine Nausea And Vomiting   Norvasc  [Amlodipine ] Rash and Other (See Comments)    rash     Review of Systems  Constitutional: Negative.   Respiratory: Negative.    Cardiovascular: Negative.   Neurological: Negative.   Psychiatric/Behavioral: Negative.       Today's Vitals   03/09/24 1211  BP: 124/60  Pulse: 92  Temp: 97.7 F (36.5 C)  TempSrc: Oral  Weight: 157 lb (71.2 kg)  Height: 5' 6 (1.676 m)  PainSc: 3    Body mass index is 25.34 kg/m.  Wt Readings from Last 3 Encounters:  03/09/24 157 lb (71.2 kg)  03/09/24 157 lb 6.4 oz (71.4 kg)  02/17/24 159 lb (72.1 kg)     Objective:  Physical Exam Vitals and nursing note reviewed.  Constitutional:      General: She is not in acute distress.    Appearance: Normal appearance.  Cardiovascular:     Rate and Rhythm: Normal rate and regular rhythm.     Pulses: Normal pulses.     Heart sounds: Normal heart sounds. No murmur heard. Pulmonary:     Effort: Pulmonary effort is normal. No respiratory distress.     Breath sounds: Normal breath sounds. No wheezing.     Comments: Wearing supplemental oxygen  Musculoskeletal:        General: No swelling or tenderness.     Right wrist: No tenderness. Normal range of motion.     Left wrist: No tenderness. Normal range of motion.     Comments: Uses rolling walker  Skin:    General: Skin  is warm and dry.  Neurological:     General: No focal deficit present.     Mental Status: She is alert and oriented to person, place, and time.     Cranial Nerves: No cranial nerve deficit.     Motor: No weakness.  Psychiatric:        Mood and Affect: Mood normal.        Behavior: Behavior normal.  Thought Content: Thought content normal.        Judgment: Judgment normal.         Assessment And Plan:  History of UTI Assessment & Plan: Recurrent UTIs with resistant bacteria. Fosfomycin prescribed after nitrofurantoin  was ineffective. Coordinated with urologist for management due to antibiotic resistance. - Contact [facility] to discuss fosfomycin dosing and medication instructions. - Hold nitrofurantoin ; confirm discontinuation with urologist. - Follow up with Dr. [name] at [facility] on March 15, 2024. - Scheduled follow-up with Dr. [name] for UTIs and kidney stones. Physical exam due. - Schedule physical exam in four months.     Return for 4 month HM and BP check.  Patient was given opportunity to ask questions. Patient verbalized understanding of the plan and was able to repeat key elements of the plan. All questions were answered to their satisfaction.    Abigail [name], FNP, have reviewed all documentation for this visit. The documentation on 03/09/24 for the exam, diagnosis, procedures, and orders are all accurate and complete.   IF YOU HAVE BEEN REFERRED TO A SPECIALIST, IT MAY TAKE 1-2 WEEKS TO SCHEDULE/PROCESS THE REFERRAL. IF YOU HAVE NOT HEARD FROM US /SPECIALIST IN TWO WEEKS, PLEASE GIVE US  A CALL AT [phone] X [phone].

## 2024-03-09 NOTE — Patient Instructions (Signed)
 Contact [facility] about your fosfomycin if you need to take what has been sent to the pharmacy and approved.  Also ask to clarify if you need to stop the nitrofuratoin.

## 2024-03-09 NOTE — Progress Notes (Signed)
 Subjective:   Abigail [name] is a 88 y.o. who presents for a Medicare Wellness preventive visit.  As a reminder, Annual Wellness Visits don't include a physical exam, and some assessments may be limited, especially if this visit is performed virtually. We may recommend an in-person follow-up visit with your provider if needed.  Visit Complete: In person    Persons Participating in Visit: Patient.  AWV Questionnaire: No: Patient Medicare AWV questionnaire was not completed prior to this visit.  Cardiac Risk Factors include: advanced age (>23men, >23 women);dyslipidemia;hypertension     Objective:    Today's Vitals   03/09/24 1144  BP: 124/60  Pulse: 92  Temp: 97.7 F (36.5 C)  TempSrc: Oral  SpO2: 97%  Weight: 157 lb 6.4 oz (71.4 kg)  Height: 5' 6 (1.676 m)  PF: (!) 3 L/min   Body mass index is 25.41 kg/m.     03/09/2024   11:53 AM 01/29/2024    2:30 AM 01/28/2024    3:49 PM 10/11/2023    6:37 AM 06/23/2023    1:25 PM 05/22/2023   10:37 AM 02/25/2023   12:12 PM  Advanced Directives  Does Patient Have a Medical Advance Directive? No  No No No No No  Would patient like information on creating a medical advance directive? No - Patient declined Yes (Inpatient - patient requests chaplain consult to create a medical advance directive)    No - Patient declined No - Patient declined    Current Medications (verified) Outpatient Encounter Medications as of 03/09/2024  Medication Sig   albuterol  (VENTOLIN  HFA) 108 (90 Base) MCG/ACT inhaler TAKE 2 PUFFS BY MOUTH EVERY 6 HOURS AS NEEDED FOR WHEEZE OR SHORTNESS OF BREATH   aspirin  EC 81 MG tablet Take 81 mg by mouth at bedtime.   atorvastatin  (LIPITOR ) 80 MG tablet Take 1 tablet (80 mg total) by mouth daily at 6 PM.   BREZTRI  AEROSPHERE 160-9-4.8 MCG/ACT AERO inhaler INHALE 2 PUFFS INTO THE LUNGS IN THE MORNING AND ALSO 2 PUFFS AT BEDTIME   estradiol  (ESTRACE ) 0.1 MG/GM vaginal cream Place 0.5g twice a week at opening of vagina    ferrous sulfate  325 (65 FE) MG EC tablet TAKE 1 TABLET BY MOUTH EVERY DAY WITH BREAKFAST   levothyroxine  (SYNTHROID ) 175 MCG tablet TAKE 1 TABLET BY MOUTH EVERY MORNING ON MONDAY - FRIDAY   lisinopril  (ZESTRIL ) 10 MG tablet TAKE 1 TABLET BY MOUTH EVERY DAY   metoprolol  succinate (TOPROL -XL) 25 MG 24 hr tablet Take 1 tablet (25 mg total) by mouth daily.   nitroGLYCERIN  (NITROSTAT ) 0.4 MG SL tablet PLACE 1 TABLET UNDER THE TONGUE EVERY 5 (FIVE) MINUTES X 3 DOSES AS NEEDED FOR CHEST PAIN.   OXYGEN  Inhale 3 L into the lungs continuous.   trospium  (SANCTURA ) 20 MG tablet Take 1 tablet (20 mg total) by mouth 2 (two) times daily.   No facility-administered encounter medications on file as of 03/09/2024.    Allergies (verified) Codeine and Norvasc  [amlodipine ]   History: Past Medical History:  Diagnosis Date   Anemia    Arthritis    knees, back   Bacteremia 04/08/2012   CAP (community acquired pneumonia) 04/12/2012   Carotid artery occlusion    Chronic kidney disease    stage 3 ckd no nephrologist   Complete uterine prolapse with prolapse of anterior vaginal wall    Complication of anesthesia    hard to wake up per pt   Constipation    Coronary artery disease  Diverticulitis yrs ago coialitis   Dyspnea    History of blood transfusion    History of radiation therapy    right lung 08/05/2021-09/18/2021  Dr Lynwood Nasuti   Hypertension    Hypothyroid    LLQ abdominal pain 04/05/2012   Lung cancer (HCC)    Numbness    in hands at times   Pneumonia    Pre-diabetes    Scoliosis    STEMI (ST elevation myocardial infarction) (HCC) 10/26/2019   DES RCA   Wears dentures    full dentures   Wears glasses    for reading   Past Surgical History:  Procedure Laterality Date   ANTERIOR AND POSTERIOR REPAIR WITH SACROSPINOUS FIXATION N/A 12/20/2020   Procedure: SACROSPINOUS LIGAMENT FIXATION;  Surgeon: Marilynne Rosaline SAILOR, MD;  Location: [facility];  Service: Gynecology;   Laterality: N/A;  Total time requested for all procedures is 2 hours   BACK SURGERY  1980 age 62   lower   bartholin cyst removal  age 57's   BLADDER SUSPENSION N/A 12/20/2020   Procedure: TRANSVAGINAL TAPE (TVT) PROCEDURE;  Surgeon: Marilynne Rosaline SAILOR, MD;  Location: [facility];  Service: Gynecology;  Laterality: N/A;   BRONCHIAL BRUSHINGS  07/15/2021   Procedure: BRONCHIAL BRUSHINGS;  Surgeon: Shelah Lamar RAMAN, MD;  Location: [facility] ENDOSCOPY;  Service: Pulmonary;;   BRONCHIAL NEEDLE ASPIRATION BIOPSY  07/15/2021   Procedure: BRONCHIAL NEEDLE ASPIRATION BIOPSIES;  Surgeon: Shelah Lamar RAMAN, MD;  Location: [facility] ENDOSCOPY;  Service: Pulmonary;;   CORONARY/GRAFT ACUTE MI REVASCULARIZATION N/A 10/26/2019   Procedure: Coronary/Graft Acute MI Revascularization;  Surgeon: Wonda Sharper, MD;  Location: [facility] INVASIVE CV LAB;  Service: Cardiovascular;  Laterality: N/A;   CYSTOCELE REPAIR N/A 12/20/2020   Procedure: ANTERIOR AND POSTERIOR REPAIR WITH PERINEORRHAPHY;  Surgeon: Marilynne Rosaline SAILOR, MD;  Location: [facility];  Service: Gynecology;  Laterality: N/A;   CYSTOSCOPY N/A 12/20/2020   Procedure: CYSTOSCOPY;  Surgeon: Marilynne Rosaline SAILOR, MD;  Location: [facility];  Service: Gynecology;  Laterality: N/A;   ELBOW SURGERY  1990's   left   ENDOBRONCHIAL ULTRASOUND N/A 07/15/2021   Procedure: ENDOBRONCHIAL ULTRASOUND;  Surgeon: Shelah Lamar RAMAN, MD;  Location: [facility] ENDOSCOPY;  Service: Pulmonary;  Laterality: N/A;   HEMOSTASIS CONTROL  07/15/2021   Procedure: HEMOSTASIS CONTROL;  Surgeon: Shelah Lamar RAMAN, MD;  Location: [facility] ENDOSCOPY;  Service: Pulmonary;;   LEFT HEART CATH AND CORONARY ANGIOGRAPHY N/A 10/26/2019   Procedure: LEFT HEART CATH AND CORONARY ANGIOGRAPHY;  Surgeon: Wonda Sharper, MD;  Location: [facility] INVASIVE CV LAB;  Service: Cardiovascular;  Laterality: N/A;   TONSILLECTOMY     Family History  Problem Relation Age of Onset   Hypertension  Mother    Colon cancer Neg Hx    Stomach cancer Neg Hx    Esophageal cancer Neg Hx    Social History   Socioeconomic History   Marital status: Widowed    Spouse name: Not on file   Number of children: Not on file   Years of education: Not on file   Highest education level: Not on file  Occupational History   Occupation: semi retired  Tobacco Use   Smoking status: Former    Current packs/day: 0.00    Average packs/day: 1.5 packs/day for 35.0 years (52.5 ttl pk-yrs)    Types: Cigarettes    Start date: 04/06/1951    Quit date: 04/05/1986    Years since quitting: 37.9   Smokeless tobacco: Never  Vaping Use   Vaping status: Never Used  Substance and Sexual Activity   Alcohol use: No   Drug use: No   Sexual activity: Not Currently    Birth control/protection: Post-menopausal  Other Topics Concern   Not on file  Social History Narrative   Not on file   Social Drivers of Health   Financial Resource Strain: Medium Risk (03/09/2024)   Overall Financial Resource Strain (CARDIA)    Difficulty of Paying Living Expenses: Somewhat hard  Food Insecurity: No Food Insecurity (03/09/2024)   Hunger Vital Sign    Worried About Running Out of Food in the Last Year: Never true    Ran Out of Food in the Last Year: Never true  Transportation Needs: No Transportation Needs (03/09/2024)   PRAPARE - Administrator, Civil Service (Medical): No    Lack of Transportation (Non-Medical): No  Recent Concern: Transportation Needs - Unmet Transportation Needs (01/29/2024)   PRAPARE - Administrator, Civil Service (Medical): Yes    Lack of Transportation (Non-Medical): No  Physical Activity: Inactive (03/09/2024)   Exercise Vital Sign    Days of Exercise per Week: 0 days    Minutes of Exercise per Session: 0 min  Stress: No Stress Concern Present (03/09/2024)   [employer] of Occupational Health - Occupational Stress Questionnaire    Feeling of Stress: Only a little  Recent  Concern: Stress - Stress Concern Present (03/09/2024)   [employer] of Occupational Health - Occupational Stress Questionnaire    Feeling of Stress: To some extent  Social Connections: Moderately Isolated (03/09/2024)   Social Connection and Isolation Panel    Frequency of Communication with Friends and Family: More than three times a week    Frequency of Social Gatherings with Friends and Family: Once a week    Attends Religious Services: More than 4 times per year    Active Member of [employer] or Organizations: No    Attends Banker Meetings: Never    Marital Status: Widowed    Tobacco Counseling Counseling given: Not Answered    Clinical Intake:  Pre-visit preparation completed: Yes  Pain : No/denies pain     Nutritional Status: BMI 25 -29 Overweight Nutritional Risks: None Diabetes: No  Lab Results  Component Value Date   HGBA1C 6.3 (H) 12/22/2023   HGBA1C 5.8 (H) 08/24/2023   HGBA1C 5.9 (H) 02/25/2023     How often do you need to have someone help you when you read instructions, pamphlets, or other written materials from your doctor or pharmacy?: 1 - Never  Interpreter Needed?: No  Information entered by :: NAllen LPN   Activities of Daily Living     03/09/2024   11:46 AM 01/29/2024    2:29 AM  In your present state of health, do you have any difficulty performing the following activities:  Hearing? 0   Vision? 0   Difficulty concentrating or making decisions? 0   Walking or climbing stairs? 1   Dressing or bathing? 0   Doing errands, shopping? 1 0  Comment does not drive   Preparing Food and eating ? N   Using the Toilet? N   In the past six months, have you accidently leaked urine? Y   Do you have problems with loss of bowel control? N   Managing your Medications? N   Managing your Finances? N   Housekeeping or managing your Housekeeping? N     Patient Care Team: Georgina,  Gaines, FNP as PCP - General (General Practice) Patwardhan,  Newman PARAS, MD as PCP - Cardiology (Cardiology) Little, Clayborne CROME, RN as VBCI Care Management  I have updated your Care Teams any recent Medical Services you may have received from other providers in the past year.     Assessment:   This is a routine wellness examination for [facility].  Hearing/Vision screen Hearing Screening - Comments:: Denies hearing issues Vision Screening - Comments:: No regular eye exams   Goals Addressed             This Visit's Progress    Patient Stated       03/09/2024, continue drinking water       Depression Screen     03/09/2024   11:55 AM 02/10/2024    9:43 AM 02/10/2024    9:41 AM 08/24/2023   11:14 AM 06/23/2023    1:25 PM 04/07/2023   12:19 PM 02/25/2023   12:14 PM  PHQ 2/9 Scores  PHQ - 2 Score 1 0 0 0 0 0 1  PHQ- 9 Score    0  0 2    Fall Risk     03/09/2024   11:54 AM 02/10/2024    9:44 AM 08/24/2023   11:14 AM 06/23/2023    1:25 PM 04/07/2023   12:19 PM  Fall Risk   Falls in the past year? 1 1 1 1  0  Comment got tangled      Number falls in past yr: 0 0 0 0 0  Injury with Fall? 1 1 1 1  0  Comment broke arm and shoulder      Risk for fall due to : Impaired mobility;Impaired balance/gait;Medication side effect History of fall(s);Impaired mobility;Impaired balance/gait History of fall(s) Impaired balance/gait No Fall Risks  Follow up Falls prevention discussed;Falls evaluation completed Falls evaluation completed;Education provided Falls evaluation completed Falls evaluation completed Falls evaluation completed    MEDICARE RISK AT HOME:  Medicare Risk at Home Any stairs in or around the home?: No If so, are there any without handrails?: No Home free of loose throw rugs in walkways, pet beds, electrical cords, etc?: Yes Adequate lighting in your home to reduce risk of falls?: Yes Life alert?: No Use of a cane, walker or w/c?: Yes Grab bars in the bathroom?: Yes Shower chair or bench in shower?: Yes Elevated toilet seat or a  handicapped toilet?: Yes  TIMED UP AND GO:  Was the test performed?  Yes  Length of time to ambulate 10 feet: 7 sec Gait slow and steady with assistive device  Cognitive Function: 6CIT completed        03/09/2024   11:56 AM 02/25/2023   12:15 PM 02/19/2022   12:08 PM 02/13/2021   10:51 AM 01/11/2020    8:58 AM  6CIT Screen  What Year? 0 points 0 points 0 points 0 points 0 points  What month? 0 points 0 points 0 points 0 points 0 points  What time? 0 points 3 points 0 points 0 points 0 points  Count back from 20 0 points 0 points 0 points 0 points 0 points  Months in reverse 0 points 0 points 0 points 0 points 0 points  Repeat phrase 0 points 4 points 2 points 2 points 0 points  Total Score 0 points 7 points 2 points 2 points 0 points    Immunizations Immunization History  Administered Date(s) Administered   Fluad Quad(high Dose 65+) 05/21/2023   Influenza Nasal 06/28/2019  Influenza, High Dose Seasonal PF 06/18/2018, 07/14/2021, 05/29/2022   Influenza-Unspecified 05/14/2015, 04/25/2017, 06/19/2018   PFIZER(Purple Top)SARS-COV-2 Vaccination 12/10/2019, 01/02/2020, 07/10/2020, 01/11/2021   Pfizer Covid-19 Vaccine Bivalent Booster 51yrs & up 05/29/2022   Pneumococcal Conjugate-13 09/01/2019   Pneumococcal Polysaccharide-23 11/26/2021   Pneumococcal-Unspecified 08/24/2012   Tdap 02/17/2008, 01/11/2020   Zoster Recombinant(Shingrix) 06/28/2019, 10/05/2019    Screening Tests Health Maintenance  Topic Date Due   COVID-19 Vaccine (6 - 2024-25 season) 03/25/2024 (Originally 04/26/2023)   INFLUENZA VACCINE  03/25/2024   Medicare Annual Wellness (AWV)  03/09/2025   DTaP/Tdap/Td (3 - Td or Tdap) 01/10/2030   Pneumococcal Vaccine: 50+ Years  Completed   DEXA SCAN  Completed   Zoster Vaccines- Shingrix  Completed   Hepatitis B Vaccines  Aged Out   HPV VACCINES  Aged Out   Meningococcal B Vaccine  Aged Out    Health Maintenance  There are no preventive care reminders to display  for this patient.  Health Maintenance Items Addressed: Up to date  Additional Screening:  Vision Screening: Recommended annual ophthalmology exams for early detection of glaucoma and other disorders of the eye. Would you like a referral to an eye doctor? No    Dental Screening: Recommended annual dental exams for proper oral hygiene  Community Resource Referral / Chronic Care Management: CRR required this visit?  No   CCM required this visit?  No   Plan:    I have personally reviewed and noted the following in the patient's chart:   Medical and social history Use of alcohol, tobacco or illicit drugs  Current medications and supplements including opioid prescriptions. Patient is not currently taking opioid prescriptions. Functional ability and status Nutritional status Physical activity Advanced directives List of other physicians Hospitalizations, surgeries, and ER visits in previous 12 months Vitals Screenings to include cognitive, depression, and falls Referrals and appointments  In addition, I have reviewed and discussed with patient certain preventive protocols, quality metrics, and best practice recommendations. A written personalized care plan for preventive services as well as general preventive health recommendations were provided to patient.   Ardella FORBES Dawn, LPN   2/83/7974   After Visit Summary: (In Person-Printed) AVS printed and given to the patient  Notes: Nothing significant to report at this time.

## 2024-03-10 ENCOUNTER — Other Ambulatory Visit: Payer: Self-pay

## 2024-03-13 ENCOUNTER — Other Ambulatory Visit: Payer: Self-pay | Admitting: Physician Assistant

## 2024-03-13 DIAGNOSIS — C3490 Malignant neoplasm of unspecified part of unspecified bronchus or lung: Secondary | ICD-10-CM

## 2024-03-15 DIAGNOSIS — N183 Chronic kidney disease, stage 3 unspecified: Secondary | ICD-10-CM | POA: Diagnosis not present

## 2024-03-15 DIAGNOSIS — N3020 Other chronic cystitis without hematuria: Secondary | ICD-10-CM | POA: Diagnosis not present

## 2024-03-15 DIAGNOSIS — D4102 Neoplasm of uncertain behavior of left kidney: Secondary | ICD-10-CM | POA: Diagnosis not present

## 2024-03-15 DIAGNOSIS — N200 Calculus of kidney: Secondary | ICD-10-CM | POA: Diagnosis not present

## 2024-03-17 ENCOUNTER — Other Ambulatory Visit: Payer: Self-pay

## 2024-03-17 NOTE — Patient Outreach (Signed)
 Complex Care Management   Visit Note  03/17/2024  Name:  Abigail [name] MRN: [id number] DOB: Jan 24, 1936  Situation: Referral received for Complex Care Management related to Hypertensive heart and Renal disease, Recurrent UTI, Renal Calculus, Chronic Kidney Disease, Stage 3b, Hypothyroidism, Malignant neoplasm of unspecified part of unspecified bronchus or lung, Anemia. I obtained verbal consent from Patient.  Visit completed with patient on the phone.   Background:   Past Medical History:  Diagnosis Date   Anemia    Arthritis    knees, back   Bacteremia 04/08/2012   CAP (community acquired pneumonia) 04/12/2012   Carotid artery occlusion    Chronic kidney disease    stage 3 ckd no nephrologist   Complete uterine prolapse with prolapse of anterior vaginal wall    Complication of anesthesia    hard to wake up per pt   Constipation    Coronary artery disease    Diverticulitis yrs ago coialitis   Dyspnea    History of blood transfusion    History of radiation therapy    right lung 08/05/2021-09/18/2021  Dr Lynwood Nasuti   Hypertension    Hypothyroid    LLQ abdominal pain 04/05/2012   Lung cancer (HCC)    Numbness    in hands at times   Pneumonia    Pre-diabetes    Scoliosis    STEMI (ST elevation myocardial infarction) (HCC) 10/26/2019   DES RCA   Wears dentures    full dentures   Wears glasses    for reading    Assessment: Patient Reported Symptoms:  Cognitive Cognitive Status: Alert and oriented to person, place, and time Cognitive/Intellectual Conditions Management [RPT]: None reported or documented in medical history or problem list      Neurological Neurological Review of Symptoms: No symptoms reported    HEENT HEENT Symptoms Reported: Not assessed      Cardiovascular Cardiovascular Symptoms Reported: Fatigue Does patient have uncontrolled Hypertension?: No Cardiovascular Management Strategies: Medication therapy, Routine screening Cardiovascular  Self-Management Outcome: 3 (uncertain)  Respiratory Respiratory Symptoms Reported: Shortness of breath Respiratory Management Strategies: Oxygen  therapy, Routine screening, Medication therapy Respiratory Self-Management Outcome: 4 (good)  Endocrine Endocrine Symptoms Reported: No symptoms reported Is patient diabetic?: Yes Is patient checking blood sugars at home?: No Endocrine Self-Management Outcome: 3 (uncertain)  Gastrointestinal Gastrointestinal Symptoms Reported: No symptoms reported      Genitourinary Genitourinary Symptoms Reported: Frequency, Other Other Genitourinary Symptoms: Reoccurring UTI; Renal Calculus    Integumentary Integumentary Symptoms Reported: No symptoms reported    Musculoskeletal Musculoskelatal Symptoms Reviewed: Weakness Musculoskeletal Management Strategies: Routine screening, Adequate rest Musculoskeletal Self-Management Outcome: 3 (uncertain)      Psychosocial Psychosocial Symptoms Reported: No symptoms reported     Quality of Family Relationships: supportive Do you feel physically threatened by others?: No      03/09/2024   11:55 AM  Depression screen PHQ 2/9  Decreased Interest 0  Down, Depressed, Hopeless 1  PHQ - 2 Score 1    There were no vitals filed for this visit.  Medications Reviewed Today     Reviewed by Morgan Clayborne CROME, RN (Registered Nurse) on 03/17/24 at [phone]  Med List Status: <None>   Medication Order Taking? Sig Documenting Provider Last Dose Status Informant  albuterol  (VENTOLIN  HFA) 108 (90 Base) MCG/ACT inhaler [id number]  TAKE 2 PUFFS BY MOUTH EVERY 6 HOURS AS NEEDED FOR WHEEZE OR SHORTNESS OF BREATH Heilingoetter, Cassandra L, PA-C  Active Self  aspirin  EC 81 MG tablet [id number]  Take 81 mg by mouth at bedtime. [provider]  Active Self  atorvastatin  (LIPITOR ) 80 MG tablet [id number]  Take 1 tablet (80 mg total) by mouth daily at 6 PM. Patwardhan, Newman PARAS, MD  Active Self  BREZTRI  AEROSPHERE 160-9-4.8 MCG/ACT  AERO inhaler [id number]  INHALE 2 PUFFS INTO THE LUNGS IN THE MORNING AND ALSO 2 PUFFS AT BEDTIME Shelah Lamar RAMAN, MD  Active Self  estradiol  (ESTRACE ) 0.1 MG/GM vaginal cream [id number]  Place 0.5g twice a week at opening of vagina Marilynne Rosaline SAILOR, MD  Active Self  ferrous sulfate  325 (65 FE) MG EC tablet [id number]  TAKE 1 TABLET BY MOUTH EVERY DAY WITH BREAKFAST Heilingoetter, Cassandra L, PA-C  Active   levothyroxine  (SYNTHROID ) 175 MCG tablet [id number]  TAKE 1 TABLET BY MOUTH EVERY MORNING ON MONDAY - FRIDAY Moore, [name], FNP  Active Self  lisinopril  (ZESTRIL ) 10 MG tablet [id number]  TAKE 1 TABLET BY MOUTH EVERY DAY [name], FNP  Active Self  metoprolol  succinate (TOPROL -XL) 25 MG 24 hr tablet [id number]  Take 1 tablet (25 mg total) by mouth daily. Arlon Carliss ORN, DO  Active   nitrofurantoin  (MACRODANTIN ) 100 MG capsule [id number] Yes Take 100 mg by mouth daily. [provider]  Active   nitroGLYCERIN  (NITROSTAT ) 0.4 MG SL tablet [id number]  PLACE 1 TABLET UNDER THE TONGUE EVERY 5 (FIVE) MINUTES X 3 DOSES AS NEEDED FOR CHEST PAIN. [name], FNP  Active Self           Med Note ROMELLE MARCHA MALVA Austin Oct 11, 2023 12:10 PM)    OXYGEN  [id number]  Inhale 3 L into the lungs continuous. [provider]  Active Self  trospium  (SANCTURA ) 20 MG tablet [id number]  Take 1 tablet (20 mg total) by mouth 2 (two) times daily. Marilynne Rosaline SAILOR, MD  Active Self            Recommendation:   Specialty provider follow-up with Maccia, Melissa D, RPH-CPP as scheduled on Tuesday, August 5 at 1:15 PM  Follow Up Plan:   Telephone follow up appointment date/time:  Thursday, August 21 at 09:30 AM  Clayborne Ly RN BSN CCM [place]  [facility], [facility] Nurse Care Coordinator  Direct Dial: [phone] Website: [email or web address]

## 2024-03-20 ENCOUNTER — Encounter: Payer: Self-pay | Admitting: Nurse Practitioner

## 2024-03-20 NOTE — Assessment & Plan Note (Signed)
 Recurrent UTIs with resistant bacteria. Fosfomycin prescribed after nitrofurantoin  was ineffective. Coordinated with urologist for management due to antibiotic resistance. - Contact [facility] to discuss fosfomycin dosing and medication instructions. - Hold nitrofurantoin ; confirm discontinuation with urologist. - Follow up with Dr. [name] at [facility] on March 15, 2024. - Scheduled follow-up with Dr. [name] for UTIs and kidney stones. Physical exam due. - Schedule physical exam in four months.

## 2024-03-21 ENCOUNTER — Telehealth: Payer: Self-pay | Admitting: Adult Health

## 2024-03-21 NOTE — Telephone Encounter (Signed)
 Called and spoke with the patient. Pt is wearing oxygen  so I explained she would need to pay bills from [employer]. Pt has ov 9/18. Nfn

## 2024-03-21 NOTE — Telephone Encounter (Signed)
 Copied from CRM [id number]. Topic: Clinical - Order For Equipment >> Mar 17, 2024 11:06 AM Joesph PARAS wrote: Reason for CRM: Patient states that [employer] has been billing her for oxygen . Patient would like to know if she has to pay this or not, as she has not had a follow-up.

## 2024-03-21 NOTE — Patient Instructions (Signed)
 Visit Information  Thank you for taking time to visit with me today. Please don't hesitate to contact me if I can be of assistance to you before our next scheduled appointment.  Your next care management appointment is by telephone on Thursday, August 21 at 09:30 AM  Please call the care guide team at [phone] if you need to cancel, schedule, or reschedule an appointment.   Please call [phone]-TALK (toll free, 24 hour hotline) if you are experiencing a Mental Health or Behavioral Health Crisis or need someone to talk to.  Clayborne Ly RN BSN CCM [place]  [facility], [facility] Nurse Care Coordinator  Direct Dial: [phone] Website: [email or web address]

## 2024-03-29 ENCOUNTER — Encounter: Payer: Self-pay | Admitting: Physician Assistant

## 2024-03-29 ENCOUNTER — Encounter: Payer: Self-pay | Admitting: Internal Medicine

## 2024-03-29 ENCOUNTER — Other Ambulatory Visit (HOSPITAL_COMMUNITY): Payer: Self-pay

## 2024-03-29 ENCOUNTER — Ambulatory Visit: Attending: Cardiovascular Disease | Admitting: Pharmacist

## 2024-03-29 ENCOUNTER — Telehealth: Payer: Self-pay

## 2024-03-29 DIAGNOSIS — E782 Mixed hyperlipidemia: Secondary | ICD-10-CM

## 2024-03-29 NOTE — Patient Instructions (Addendum)
 I will submit a prior authorization for Repatha. I will call you once I hear back. Please call me at [phone] with any questions.   Repatha is a cholesterol medication that improved your body's ability to get rid of bad cholesterol known as LDL. It can lower your LDL up to 60%! It is an injection that is given under the skin every 2 weeks. The medication often requires a prior authorization from your insurance company. We will take care of submitting all the necessary information to your insurance company to get it approved. The most common side effects of Repatha include runny nose, symptoms of the common cold, rarely flu or flu-like symptoms, back/muscle pain in about 3-4% of the patients, and redness, pain, or bruising at the injection site. Tell your healthcare provider if you have any side effect that bothers you or that does not go away.   Continue atorvastatin  80mg  daily

## 2024-03-29 NOTE — Progress Notes (Addendum)
 Patient ID: Abigail [name]                 DOB: Jan 05, 1936                    MRN: [id number]      HPI: Abigail [name] is a 88 y.o. female patient referred to lipid clinic by Dr. Elmira. PMH is significant for hypertension, hyperlipidemia, CAD Primary PCI for STEMI in 2021, carotid disease, h/o non-small cell lung cancer, adenocarcinoma s/p chemotherapy and immunotherapy, COPD on 3 L oxygen .   Patient presents today to lipid clinic.  She reports she has been taking atorvastatin  80 mg daily.  She says that Zetia  sounds a little familiar but does not remember taking it.  Initially patient hesitant about self injections.  We reviewed that her insurance will not cover Leqvio all that well.  I reviewed Repatha  injection technique with her, along with dosing and side effects.  We discussed the benefits of adding Repatha  to her atorvastatin .  Patient does ask about taking vitamins as a lot of her friends do but she does not.  I advised that I generally do not recommend vitamin supplementation unless a deficiency is known like for example low vitamin D , low iron  or low B12.  Most of the time people are paying for expensive urine.  Patient does take iron  supplements due to anemia.  Current Medications: atorvastatin  80mg  daily Intolerances:  Risk Factors: CAD, age, HTN LDL-C goal: <70 ApoB goal:   Diet: PB on wheat thins and protein shake Banana sandwich or tuna Dinner: meals on wheels-meat and 2 vegetables Grapes, watermellon Drink: water or diet drink (rarely)  Exercise: has bands, desk cycle, weights but hasn't been using in the last few months  Family History:  Family History  Problem Relation Age of Onset   Hypertension Mother    Colon cancer Neg Hx    Stomach cancer Neg Hx    Esophageal cancer Neg Hx      Social History: quit smoking in 2002, np ETOH  Labs: Lipid Panel     Component Value Date/Time   CHOL 133 04/04/2024 0000   TRIG 88 04/04/2024 0000   HDL 42 04/04/2024 0000    CHOLHDL 3.2 04/04/2024 0000   CHOLHDL 4.8 10/26/2019 2246   VLDL 40 10/26/2019 2246   LDLCALC 74 04/04/2024 0000   LABVLDL 17 04/04/2024 0000    Past Medical History:  Diagnosis Date   Anemia    Arthritis    knees, back   Bacteremia 04/08/2012   CAP (community acquired pneumonia) 04/12/2012   Carotid artery occlusion    Chronic kidney disease    stage 3 ckd no nephrologist   Complete uterine prolapse with prolapse of anterior vaginal wall    Complication of anesthesia    hard to wake up per pt   Constipation    Coronary artery disease    Diverticulitis yrs ago coialitis   Dyspnea    History of blood transfusion    History of radiation therapy    right lung 08/05/2021-09/18/2021  Dr Lynwood Nasuti   Hypertension    Hypothyroid    LLQ abdominal pain 04/05/2012   Lung cancer (HCC)    Numbness    in hands at times   Pneumonia    Pre-diabetes    Scoliosis    STEMI (ST elevation myocardial infarction) (HCC) 10/26/2019   DES RCA   Wears dentures    full dentures   Wears  glasses    for reading    Current Outpatient Medications on File Prior to Visit  Medication Sig Dispense Refill   albuterol  (VENTOLIN  HFA) 108 (90 Base) MCG/ACT inhaler TAKE 2 PUFFS BY MOUTH EVERY 6 HOURS AS NEEDED FOR WHEEZE OR SHORTNESS OF BREATH 8.5 each 1   aspirin  EC 81 MG tablet Take 81 mg by mouth at bedtime.     atorvastatin  (LIPITOR ) 80 MG tablet Take 1 tablet (80 mg total) by mouth daily at 6 PM. 90 tablet 3   BREZTRI  AEROSPHERE 160-9-4.8 MCG/ACT AERO inhaler INHALE 2 PUFFS INTO THE LUNGS IN THE MORNING AND ALSO 2 PUFFS AT BEDTIME 10.7 each 6   estradiol  (ESTRACE ) 0.1 MG/GM vaginal cream Place 0.5g twice a week at opening of vagina 42.5 g 11   ferrous sulfate  325 (65 FE) MG EC tablet TAKE 1 TABLET BY MOUTH EVERY DAY WITH BREAKFAST 30 tablet 2   levothyroxine  (SYNTHROID ) 175 MCG tablet TAKE 1 TABLET BY MOUTH EVERY MORNING ON MONDAY - FRIDAY 90 tablet 1   metoprolol  succinate (TOPROL -XL) 25 MG 24 hr  tablet Take 1 tablet (25 mg total) by mouth daily. 90 tablet 0   nitrofurantoin  (MACRODANTIN ) 100 MG capsule Take 100 mg by mouth daily.     nitroGLYCERIN  (NITROSTAT ) 0.4 MG SL tablet PLACE 1 TABLET UNDER THE TONGUE EVERY 5 (FIVE) MINUTES X 3 DOSES AS NEEDED FOR CHEST PAIN. 25 tablet 3   OXYGEN  Inhale 3 L into the lungs continuous.     trospium  (SANCTURA ) 20 MG tablet Take 1 tablet (20 mg total) by mouth 2 (two) times daily. 180 tablet 3   No current facility-administered medications on file prior to visit.    Allergies  Allergen Reactions   Codeine Nausea And Vomiting   Norvasc  [Amlodipine ] Rash and Other (See Comments)    rash    Assessment/Plan:  1. Hyperlipidemia -  Hyperlipidemia Assessment: LDL-C is above goal of less than 70 on atorvastatin  80 mg daily She does not drive and gets Meals on Wheels for dinner Enjoys exercising but the last few months have just mentally not wanted to do it.  She is going to try to get herself back into exercising Initially hesitant about self injections but after reviewing injection technique and device patient okay with it Medicare advantage plan will not cover Leqvio well She will need a healthwell grant for the Repatha   Plan: Submit prior authorization for Repatha  She will need a healthwell grant Will send to [facility] for delivery Repeat labs in 3 months Increase physical activity Continue atorvastatin  80 mg daily     Thank you,  Hicks Feick D [place]il[place] Mcphee, Pharm.Abigail [name], CPP [facility] [address] [place], [place] [address]  Phone: [phone]; Fax: [phone]

## 2024-03-29 NOTE — Telephone Encounter (Signed)
-----   Message from Eleanor JONETTA Crews sent at 03/29/2024  2:44 PM EDT ----- Please do prior authorization for Repatha.  Patient is also going to need to healthwell grant and information put in [facility]

## 2024-03-29 NOTE — Assessment & Plan Note (Signed)
 Assessment: LDL-C is above goal of less than 70 on atorvastatin  80 mg daily She does not drive and gets Meals on Wheels for dinner Enjoys exercising but the last few months have just mentally not wanted to do it.  She is going to try to get herself back into exercising Initially hesitant about self injections but after reviewing injection technique and device patient okay with it Medicare advantage plan will not cover Leqvio well She will need a healthwell grant for the Repatha  Plan: Submit prior authorization for Repatha She will need a healthwell grant Will send to [facility] for delivery Repeat labs in 3 months Increase physical activity Continue atorvastatin  80 mg daily

## 2024-03-29 NOTE — Telephone Encounter (Signed)
 Pharmacy Patient Advocate Encounter   Received notification from Physician's Office that prior authorization for REPATHA is required/requested.   Insurance verification completed.   The patient is insured through [facility] ADVANTAGE/RX ADVANCE .   Per test claim: PA required; PA submitted to above mentioned insurance via CoverMyMeds Key/confirmation #/EOC [id number] Status is pending

## 2024-03-30 ENCOUNTER — Other Ambulatory Visit: Payer: Self-pay | Admitting: Nurse Practitioner

## 2024-03-30 DIAGNOSIS — I129 Hypertensive chronic kidney disease with stage 1 through stage 4 chronic kidney disease, or unspecified chronic kidney disease: Secondary | ICD-10-CM

## 2024-03-30 NOTE — Telephone Encounter (Signed)
Scanned in media as well.

## 2024-03-30 NOTE — Telephone Encounter (Signed)
 Insurance wont accept labs from march. Will need updated lipid panel. Called pt but VM full.  I ordered labs If you can try to call her again and let her know that she needs labs for insurance. Thanks

## 2024-03-31 NOTE — Telephone Encounter (Signed)
 Spoke to patient, [name] will ask nurse to include this lipid lab. If not she will go for lab on Friday Aug 16,2025.

## 2024-04-04 ENCOUNTER — Inpatient Hospital Stay: Payer: PPO

## 2024-04-04 ENCOUNTER — Inpatient Hospital Stay: Attending: Internal Medicine

## 2024-04-04 ENCOUNTER — Ambulatory Visit (HOSPITAL_COMMUNITY)
Admission: RE | Admit: 2024-04-04 | Discharge: 2024-04-04 | Disposition: A | Discharge status: Home / Self Care | Source: Ambulatory Visit | Attending: Internal Medicine | Admitting: Internal Medicine

## 2024-04-04 DIAGNOSIS — C3490 Malignant neoplasm of unspecified part of unspecified bronchus or lung: Secondary | ICD-10-CM

## 2024-04-04 DIAGNOSIS — Z85118 Personal history of other malignant neoplasm of bronchus and lung: Secondary | ICD-10-CM | POA: Diagnosis not present

## 2024-04-04 DIAGNOSIS — J479 Bronchiectasis, uncomplicated: Secondary | ICD-10-CM | POA: Diagnosis not present

## 2024-04-04 DIAGNOSIS — Z08 Encounter for follow-up examination after completed treatment for malignant neoplasm: Secondary | ICD-10-CM | POA: Diagnosis not present

## 2024-04-04 DIAGNOSIS — I700 Atherosclerosis of aorta: Secondary | ICD-10-CM | POA: Diagnosis not present

## 2024-04-04 DIAGNOSIS — J90 Pleural effusion, not elsewhere classified: Secondary | ICD-10-CM | POA: Diagnosis not present

## 2024-04-04 DIAGNOSIS — E782 Mixed hyperlipidemia: Secondary | ICD-10-CM | POA: Diagnosis not present

## 2024-04-04 DIAGNOSIS — Z9221 Personal history of antineoplastic chemotherapy: Secondary | ICD-10-CM | POA: Insufficient documentation

## 2024-04-04 DIAGNOSIS — Z923 Personal history of irradiation: Secondary | ICD-10-CM | POA: Diagnosis not present

## 2024-04-04 LAB — CMP (CANCER CENTER ONLY)
ALT: 10 U/L (ref 0–44)
AST: 12 U/L — ABNORMAL LOW (ref 15–41)
Albumin: 4.1 g/dL (ref 3.5–5.0)
Alkaline Phosphatase: 97 U/L (ref 38–126)
Anion gap: 5 (ref 5–15)
BUN: 42 mg/dL — ABNORMAL HIGH (ref 8–23)
CO2: 29 mmol/L (ref 22–32)
Calcium: 9 mg/dL (ref 8.9–10.3)
Chloride: 104 mmol/L (ref 98–111)
Creatinine: 1.37 mg/dL — ABNORMAL HIGH (ref 0.44–1.00)
GFR, Estimated: 38 mL/min — ABNORMAL LOW (ref >60)
Glucose, Bld: 97 mg/dL (ref 70–99)
Potassium: 4.8 mmol/L (ref 3.5–5.1)
Sodium: 138 mmol/L (ref 135–145)
Total Bilirubin: 0.2 mg/dL (ref 0.0–1.2)
Total Protein: 7.6 g/dL (ref 6.5–8.1)

## 2024-04-04 LAB — CBC WITH DIFFERENTIAL (CANCER CENTER ONLY)
Abs Immature Granulocytes: 0.02 K/uL (ref 0.00–0.07)
Basophils Absolute: 0 K/uL (ref 0.0–0.1)
Basophils Relative: 1 %
Eosinophils Absolute: 0.3 K/uL (ref 0.0–0.5)
Eosinophils Relative: 4 %
HCT: 28.4 % — ABNORMAL LOW (ref 36.0–46.0)
Hemoglobin: 8.8 g/dL — ABNORMAL LOW (ref 12.0–15.0)
Immature Granulocytes: 0 %
Lymphocytes Relative: 7 %
Lymphs Abs: 0.4 K/uL — ABNORMAL LOW (ref 0.7–4.0)
MCH: 28.8 pg (ref 26.0–34.0)
MCHC: 31 g/dL (ref 30.0–36.0)
MCV: 92.8 fL (ref 80.0–100.0)
Monocytes Absolute: 0.6 K/uL (ref 0.1–1.0)
Monocytes Relative: 10 %
Neutro Abs: 4.9 K/uL (ref 1.7–7.7)
Neutrophils Relative %: 78 %
Platelet Count: 239 K/uL (ref 150–400)
RBC: 3.06 MIL/uL — ABNORMAL LOW (ref 3.87–5.11)
RDW: 15.9 % — ABNORMAL HIGH (ref 11.5–15.5)
WBC Count: 6.2 K/uL (ref 4.0–10.5)
nRBC: 0 % (ref 0.0–0.2)

## 2024-04-05 ENCOUNTER — Other Ambulatory Visit (HOSPITAL_COMMUNITY): Payer: Self-pay

## 2024-04-05 ENCOUNTER — Telehealth: Payer: Self-pay | Admitting: Pharmacy Technician

## 2024-04-05 LAB — LIPID PANEL
Chol/HDL Ratio: 3.2 ratio (ref 0.0–4.4)
Cholesterol, Total: 133 mg/dL (ref 100–199)
HDL: 42 mg/dL (ref >39)
LDL Chol Calc (NIH): 74 mg/dL (ref 0–99)
Triglycerides: 88 mg/dL (ref 0–149)
VLDL Cholesterol Cal: 17 mg/dL (ref 5–40)

## 2024-04-05 LAB — SPECIMEN STATUS REPORT

## 2024-04-05 NOTE — Telephone Encounter (Signed)
 This was previously denied. Insurance would not let me renew so I submitted the new labs via [phone] to go along with the denial in hopes for a reversal.

## 2024-04-11 ENCOUNTER — Inpatient Hospital Stay: Payer: PPO | Admitting: Internal Medicine

## 2024-04-11 VITALS — BP 163/63 | HR 61 | Temp 97.2°F | Resp 17 | Ht 66.0 in | Wt 158.0 lb

## 2024-04-11 DIAGNOSIS — Z08 Encounter for follow-up examination after completed treatment for malignant neoplasm: Secondary | ICD-10-CM | POA: Diagnosis not present

## 2024-04-11 DIAGNOSIS — C3490 Malignant neoplasm of unspecified part of unspecified bronchus or lung: Secondary | ICD-10-CM

## 2024-04-11 NOTE — Progress Notes (Signed)
 [facility] [phone]   [phone]  OFFICE PROGRESS NOTE  Abigail Newman PARAS, MD [address] [place] [address]  DIAGNOSIS: Stage IIIA (T2a, N2, M0) non-small cell lung cancer, adenocarcinoma presented with left lower lobe lung mass in addition to left hilar and mediastinal lymphadenopathy diagnosed in November 2022 with no actionable mutation on the guardant blood test   PRIOR THERAPY:  1) Weekly concurrent chemoradiation with carboplatin  for an AUC of 2 and paclitaxel  45 mg per metered squared.  First dose on 08/05/2021.  Status post 6 cycles.  Last dose was given on September 03, 2021. 2) Consolidation treatment with immunotherapy with Imfinzi  1500 Mg IV every 4 weeks.  First dose October 23, 2021.  Status post 13 cycles.   CURRENT THERAPY: Observation.  INTERVAL HISTORY: Abigail [name] 88 y.o. female returns to the clinic today for follow-up visit. Discussed the use of AI scribe software for clinical note transcription with the patient, who gave verbal consent to proceed.  History of Present Illness Abigail [name] is an 88 year old female with stage three non-small cell lung cancer who presents for evaluation with repeat CT scan of the chest for restaging of her disease.  She has a history of stage three non-small cell lung cancer, specifically adenocarcinoma, diagnosed in November 2022. She completed a course of concurrent chemoradiation followed by one year of consolidation treatment with immunotherapy using Imfinzi , which was completed in February 2022. Since then, she has been under observation.  She uses three liters of oxygen , which helps her when moving around. No new chest pain, cough, hemoptysis, nausea, vomiting, diarrhea, or headaches. She coughs but does not cough up blood.  She lives in a house where her son has a room and checks on her regularly. She also has a dog, which she cares for.     MEDICAL HISTORY: Past Medical  History:  Diagnosis Date   Anemia    Arthritis    knees, back   Bacteremia 04/08/2012   CAP (community acquired pneumonia) 04/12/2012   Carotid artery occlusion    Chronic kidney disease    stage 3 ckd no nephrologist   Complete uterine prolapse with prolapse of anterior vaginal wall    Complication of anesthesia    hard to wake up per pt   Constipation    Coronary artery disease    Diverticulitis yrs ago coialitis   Dyspnea    History of blood transfusion    History of radiation therapy    right lung 08/05/2021-09/18/2021  Dr Lynwood Nasuti   Hypertension    Hypothyroid    LLQ abdominal pain 04/05/2012   Lung cancer (HCC)    Numbness    in hands at times   Pneumonia    Pre-diabetes    Scoliosis    STEMI (ST elevation myocardial infarction) (HCC) 10/26/2019   DES RCA   Wears dentures    full dentures   Wears glasses    for reading    ALLERGIES:  is allergic to codeine and norvasc  [amlodipine ].  MEDICATIONS:  Current Outpatient Medications  Medication Sig Dispense Refill   albuterol  (VENTOLIN  HFA) 108 (90 Base) MCG/ACT inhaler TAKE 2 PUFFS BY MOUTH EVERY 6 HOURS AS NEEDED FOR WHEEZE OR SHORTNESS OF BREATH 8.5 each 1   aspirin  EC 81 MG tablet Take 81 mg by mouth at bedtime.     atorvastatin  (LIPITOR ) 80 MG tablet Take 1 tablet (80 mg total) by  mouth daily at 6 PM. 90 tablet 3   BREZTRI  AEROSPHERE 160-9-4.8 MCG/ACT AERO inhaler INHALE 2 PUFFS INTO THE LUNGS IN THE MORNING AND ALSO 2 PUFFS AT BEDTIME 10.7 each 6   estradiol  (ESTRACE ) 0.1 MG/GM vaginal cream Place 0.5g twice a week at opening of vagina 42.5 g 11   ferrous sulfate  325 (65 FE) MG EC tablet TAKE 1 TABLET BY MOUTH EVERY DAY WITH BREAKFAST 30 tablet 2   levothyroxine  (SYNTHROID ) 175 MCG tablet TAKE 1 TABLET BY MOUTH EVERY MORNING ON MONDAY - FRIDAY 90 tablet 1   lisinopril  (ZESTRIL ) 10 MG tablet TAKE 1 TABLET BY MOUTH EVERY DAY 90 tablet 1   metoprolol  succinate (TOPROL -XL) 25 MG 24 hr tablet Take 1 tablet (25 mg  total) by mouth daily. 90 tablet 0   nitrofurantoin  (MACRODANTIN ) 100 MG capsule Take 100 mg by mouth daily.     nitroGLYCERIN  (NITROSTAT ) 0.4 MG SL tablet PLACE 1 TABLET UNDER THE TONGUE EVERY 5 (FIVE) MINUTES X 3 DOSES AS NEEDED FOR CHEST PAIN. 25 tablet 3   OXYGEN  Inhale 3 L into the lungs continuous.     trospium  (SANCTURA ) 20 MG tablet Take 1 tablet (20 mg total) by mouth 2 (two) times daily. 180 tablet 3   No current facility-administered medications for this visit.    SURGICAL HISTORY:  Past Surgical History:  Procedure Laterality Date   ANTERIOR AND POSTERIOR REPAIR WITH SACROSPINOUS FIXATION N/A 12/20/2020   Procedure: SACROSPINOUS LIGAMENT FIXATION;  Surgeon: Marilynne Rosaline SAILOR, MD;  Location: [facility];  Service: Gynecology;  Laterality: N/A;  Total time requested for all procedures is 2 hours   BACK SURGERY  1980 age 64   lower   bartholin cyst removal  age 87's   BLADDER SUSPENSION N/A 12/20/2020   Procedure: TRANSVAGINAL TAPE (TVT) PROCEDURE;  Surgeon: Marilynne Rosaline SAILOR, MD;  Location: [facility];  Service: Gynecology;  Laterality: N/A;   BRONCHIAL BRUSHINGS  07/15/2021   Procedure: BRONCHIAL BRUSHINGS;  Surgeon: Shelah Lamar RAMAN, MD;  Location: [facility] ENDOSCOPY;  Service: Pulmonary;;   BRONCHIAL NEEDLE ASPIRATION BIOPSY  07/15/2021   Procedure: BRONCHIAL NEEDLE ASPIRATION BIOPSIES;  Surgeon: Shelah Lamar RAMAN, MD;  Location: [facility] ENDOSCOPY;  Service: Pulmonary;;   CORONARY/GRAFT ACUTE MI REVASCULARIZATION N/A 10/26/2019   Procedure: Coronary/Graft Acute MI Revascularization;  Surgeon: Wonda Sharper, MD;  Location: [facility] INVASIVE CV LAB;  Service: Cardiovascular;  Laterality: N/A;   CYSTOCELE REPAIR N/A 12/20/2020   Procedure: ANTERIOR AND POSTERIOR REPAIR WITH PERINEORRHAPHY;  Surgeon: Marilynne Rosaline SAILOR, MD;  Location: [facility];  Service: Gynecology;  Laterality: N/A;   CYSTOSCOPY N/A 12/20/2020   Procedure: CYSTOSCOPY;   Surgeon: Marilynne Rosaline SAILOR, MD;  Location: [facility];  Service: Gynecology;  Laterality: N/A;   ELBOW SURGERY  1990's   left   ENDOBRONCHIAL ULTRASOUND N/A 07/15/2021   Procedure: ENDOBRONCHIAL ULTRASOUND;  Surgeon: Shelah Lamar RAMAN, MD;  Location: [facility] ENDOSCOPY;  Service: Pulmonary;  Laterality: N/A;   HEMOSTASIS CONTROL  07/15/2021   Procedure: HEMOSTASIS CONTROL;  Surgeon: Shelah Lamar RAMAN, MD;  Location: [facility] ENDOSCOPY;  Service: Pulmonary;;   LEFT HEART CATH AND CORONARY ANGIOGRAPHY N/A 10/26/2019   Procedure: LEFT HEART CATH AND CORONARY ANGIOGRAPHY;  Surgeon: Wonda Sharper, MD;  Location: [facility] INVASIVE CV LAB;  Service: Cardiovascular;  Laterality: N/A;   TONSILLECTOMY      REVIEW OF SYSTEMS:  A comprehensive review of systems was negative except for: Constitutional: positive for fatigue Respiratory: positive  for dyspnea on exertion Musculoskeletal: positive for muscle weakness   PHYSICAL EXAMINATION: General appearance: alert, cooperative, fatigued, and no distress Head: Normocephalic, without obvious abnormality, atraumatic Neck: no adenopathy, no JVD, supple, symmetrical, trachea midline, and thyroid  not enlarged, symmetric, no tenderness/mass/nodules Lymph nodes: Cervical, supraclavicular, and axillary nodes normal. Resp: clear to auscultation bilaterally Back: symmetric, no curvature. ROM normal. No CVA tenderness. Cardio: regular rate and rhythm, S1, S2 normal, no murmur, click, rub or gallop GI: soft, non-tender; bowel sounds normal; no masses,  no organomegaly Extremities: extremities normal, atraumatic, no cyanosis or edema  ECOG PERFORMANCE STATUS: 1 - Symptomatic but completely ambulatory  Blood pressure (!) 163/63, pulse 61, temperature (!) 97.2 F (36.2 C), temperature source Temporal, resp. rate 17, height 5' 6 (1.676 m), weight 158 lb (71.7 kg), SpO2 100%.  LABORATORY DATA: Lab Results  Component Value Date   WBC 6.2 04/04/2024   HGB 8.8 (L)  04/04/2024   HCT 28.4 (L) 04/04/2024   MCV 92.8 04/04/2024   PLT 239 04/04/2024      Chemistry      Component Value Date/Time   NA 138 04/04/2024 1435   NA 137 02/17/2024 1150   K 4.8 04/04/2024 1435   CL 104 04/04/2024 1435   CO2 29 04/04/2024 1435   BUN 42 (H) 04/04/2024 1435   BUN 38 (H) 02/17/2024 1150   CREATININE 1.37 (H) 04/04/2024 1435   CREATININE 1.08 04/18/2014 1516   GLU 104 12/23/2017 1111      Component Value Date/Time   CALCIUM  9.0 04/04/2024 1435   ALKPHOS 97 04/04/2024 1435   AST 12 (L) 04/04/2024 1435   ALT 10 04/04/2024 1435   BILITOT 0.2 04/04/2024 1435       RADIOGRAPHIC STUDIES: CT Chest Wo Contrast Result Date: 04/11/2024 CLINICAL DATA:  Non-small cell lung cancer (NSCLC), staging. * Tracking Code: BO * EXAM: CT CHEST WITHOUT CONTRAST TECHNIQUE: Multidetector CT imaging of the chest was performed following the standard protocol without IV contrast. RADIATION DOSE REDUCTION: This exam was performed according to the departmental dose-optimization program which includes automated exposure control, adjustment of the mA and/or kV according to patient size and/or use of iterative reconstruction technique. COMPARISON:  CT angiography chest from 10/11/2023. FINDINGS: Cardiovascular: Normal cardiac size. No pericardial effusion. No aortic aneurysm. There are coronary artery calcifications, in keeping with coronary artery disease. There are also moderate to severe peripheral atherosclerotic vascular calcifications of thoracic aorta and its major branches. Mediastinum/Nodes: Visualized thyroid  gland appears grossly unremarkable. No solid / cystic mediastinal masses. The esophagus is nondistended precluding optimal assessment. No mediastinal or axillary lymphadenopathy by size criteria. Evaluation of bilateral hila is limited due to lack on intravenous contrast: however, no large hilar lymphadenopathy identified. Lungs/Pleura: The central tracheo-bronchial tree is patent.  Redemonstration of heterogeneous left perihilar opacity with associated linear bandlike opacities extending into the left upper lobe and superior segment of left lower lobe with associated bronchiectasis, grossly similar to the prior study. Please note comparison is slightly limited due to lack of intravenous contrast on this exam. There is associated small left pleural effusion,, slightly decreased since the prior study. No right pleural effusion. No suspicious lung nodule seen. Upper Abdomen: At least mild atrophy of visualized left kidney which exhibits several hypo and hyperattenuating opacities, not well characterized on the current exam but grossly similar to the prior study and favored to represent underlying simple/hemorrhagic cysts. Redemonstration of moderate sized hiatal hernia. Remaining visualized upper abdominal viscera within normal limits. Musculoskeletal: The  visualized soft tissues of the chest wall are grossly unremarkable. No suspicious osseous lesions. There is diffuse osteopenia of the visualized osseous structures. There are mild to moderate multilevel degenerative changes in the visualized spine. IMPRESSION: 1. Redemonstration of heterogeneous left perihilar opacity with associated linear bandlike opacities extending into the left upper lobe and superior segment of left lower lobe with associated bronchiectasis, grossly similar to the prior study. There is associated small left pleural effusion, slightly decreased since the prior study. Please note, comparison is slightly limited due to lack of intravenous contrast on this exam. 2. Multiple other nonacute observations, as described above. Aortic Atherosclerosis (ICD10-I70.0). Electronically Signed   By: Ree Molt M.D.   On: 04/11/2024 10:19    ASSESSMENT AND PLAN: This is a very pleasant 88 years old white female with stage IIIA (T2a, N2, M0) non-small cell lung cancer, adenocarcinoma presented with left lower lobe lung mass in  addition to left hilar and mediastinal lymphadenopathy diagnosed in November 2022 with no actionable mutations The patient is currently undergoing a course of concurrent chemotherapy with radiation.  Her chemotherapy is in the form of carboplatin  for AUC of 2 and paclitaxel  45 Mg/M2 status post 6 cycles.  She has been tolerating her treatment with concurrent chemoradiation fairly well except for the radiation-induced odynophagia and dysphagia. Her scan showed significant improvement in her disease with 65% reduction in the volume of the left lower lobe nodule.  She continues to have persistent left hilar and infrahilar adenopathy. The patient completed treatment with consolidation immunotherapy with Imfinzi  1500 Mg IV every 4 weeks.  Status post 13 cycles.  She tolerated her treatment fairly well.  She is currently on observation. She had repeat CT scan of the chest performed recently.  I personally independently reviewed the scan and discussed the result with the patient today. Her scan showed no concerning findings for disease recurrence or metastasis. Assessment and Plan Assessment & Plan Stage III non-small cell lung cancer, post-treatment, under surveillance Stage III non-small cell lung cancer, diagnosed in November 2022, treated with concurrent chemoradiation followed by one year of consolidation immunotherapy with Imfinzi , completed in February 2023. Current CT scan shows no evidence of disease progression or metastasis. Mild pleural effusion noted but not concerning. - Continue surveillance with repeat CT scan of the chest in six months.  Chronic hypoxemic respiratory failure requiring supplemental oxygen  Chronic hypoxemic respiratory failure managed with supplemental oxygen  at 3 liters per minute. No new symptoms such as chest pain, hemoptysis, or increased cough reported.  Stable mild pleural effusion Mild pleural effusion noted on current CT scan with no significant changes or symptoms  warranting intervention. For the COPD and persistent shortness of breath, I will refer the patient to Dr. Shelah for evaluation and recommendation regarding her condition. She was advised to call immediately if she has any concerning symptoms in the interval. The patient voices understanding of current disease status and treatment options and is in agreement with the current care plan. The total time spent in the appointment was 20 minutes.  All questions were answered. The patient knows to call the clinic with any problems, questions or concerns. We can certainly see the patient much sooner if necessary.  Disclaimer: This note was dictated with voice recognition software. Similar sounding words can inadvertently be transcribed and may not be corrected upon review.

## 2024-04-12 ENCOUNTER — Telehealth: Payer: Self-pay | Admitting: Internal Medicine

## 2024-04-12 NOTE — Telephone Encounter (Signed)
 Scheduled appointments per [phone] los. Unable to make contact with the patient due to mailbox being set up. Patient will be mailed an appointment reminder.

## 2024-04-13 ENCOUNTER — Other Ambulatory Visit (HOSPITAL_COMMUNITY): Payer: Self-pay

## 2024-04-13 NOTE — Telephone Encounter (Signed)
 This now goes through insurance -copay is 47.00 for 1 month

## 2024-04-13 NOTE — Telephone Encounter (Signed)
 This now goes through insurance for 47.00. sent message in other encounter

## 2024-04-14 ENCOUNTER — Other Ambulatory Visit: Payer: Self-pay

## 2024-04-14 ENCOUNTER — Telehealth: Payer: Self-pay | Admitting: Pharmacist

## 2024-04-14 ENCOUNTER — Encounter: Payer: Self-pay | Admitting: Internal Medicine

## 2024-04-14 ENCOUNTER — Ambulatory Visit: Payer: Self-pay | Admitting: Pharmacist

## 2024-04-14 ENCOUNTER — Encounter: Payer: Self-pay | Admitting: Physician Assistant

## 2024-04-14 ENCOUNTER — Other Ambulatory Visit: Payer: Self-pay | Admitting: Nurse Practitioner

## 2024-04-14 ENCOUNTER — Other Ambulatory Visit (HOSPITAL_COMMUNITY): Payer: Self-pay

## 2024-04-14 ENCOUNTER — Telehealth: Payer: Self-pay

## 2024-04-14 DIAGNOSIS — E039 Hypothyroidism, unspecified: Secondary | ICD-10-CM

## 2024-04-14 MED ORDER — REPATHA SURECLICK 140 MG/ML ~~LOC~~ SOAJ
1.0000 mL | SUBCUTANEOUS | 3 refills | Status: DC
  Filled 2024-04-14: qty 6, 84d supply, fill #0
  Filled 2024-07-19: qty 6, 84d supply, fill #1

## 2024-04-14 MED ORDER — ATORVASTATIN CALCIUM 40 MG PO TABS: 40.0000 mg | ORAL_TABLET | Freq: Every day | ORAL | 3 refills | Status: DC

## 2024-04-14 NOTE — Telephone Encounter (Signed)
 With patient.  Reviewed her repeat labs which look a lot better.  LDL-C still above goal at 74.  We discussed either ezetimibe  or Repatha .  Patient willing to try Repatha .  Will decrease atorvastatin  to 40 mg daily.  Repatha  has been approved with the new labs, just need to get her at health will grant and send to [facility] for delivery.

## 2024-04-14 NOTE — Telephone Encounter (Signed)
 Done

## 2024-04-14 NOTE — Patient Outreach (Signed)
 Complex Care Management   Visit Note  04/14/2024  Name:  Abigail [name] MRN: [id number] DOB: [birth year]  Situation: Referral received for Complex Care Management related to Hypertensive heart and Renal disease, Recurrent UTI, Renal Calculus, Chronic Kidney Disease, Stage 3b, Hypothyroidism, Malignant neoplasm of unspecified part of unspecified bronchus or lung, Anemia.  I obtained verbal consent from Patient.  Visit completed with Patient  on the phone.  Background:   Past Medical History:  Diagnosis Date   Anemia    Arthritis    knees, back   Bacteremia 04/08/2012   CAP (community acquired pneumonia) 04/12/2012   Carotid artery occlusion    Chronic kidney disease    stage 3 ckd no nephrologist   Complete uterine prolapse with prolapse of anterior vaginal wall    Complication of anesthesia    hard to wake up per pt   Constipation    Coronary artery disease    Diverticulitis yrs ago coialitis   Dyspnea    History of blood transfusion    History of radiation therapy    right lung 08/05/2021-09/18/2021  Dr Lynwood Nasuti   Hypertension    Hypothyroid    LLQ abdominal pain 04/05/2012   Lung cancer (HCC)    Numbness    in hands at times   Pneumonia    Pre-diabetes    Scoliosis    STEMI (ST elevation myocardial infarction) (HCC) 10/26/2019   DES RCA   Wears dentures    full dentures   Wears glasses    for reading    Assessment: Patient Reported Symptoms:  Cognitive Cognitive Status: Alert and oriented to person, place, and time, Normal speech and language skills Cognitive/Intellectual Conditions Management [RPT]: None reported or documented in medical history or problem list   Health Maintenance Behaviors: Annual physical exam, Healthy diet Health Facilitated by: Rest, Healthy diet, Stress management  Neurological Neurological Review of Symptoms: No symptoms reported    HEENT HEENT Symptoms Reported: No symptoms reported      Cardiovascular Cardiovascular Symptoms  Reported: Fatigue Does patient have uncontrolled Hypertension?: No Cardiovascular Management Strategies: Medication therapy, Routine screening, Adequate rest Cardiovascular Self-Management Outcome: 4 (good)  Respiratory Respiratory Symptoms Reported: Productive cough, Shortness of breath Respiratory Management Strategies: Adequate rest, Oxygen  therapy, Routine screening, Medication therapy Respiratory Self-Management Outcome: 3 (uncertain)  Endocrine Endocrine Symptoms Reported: No symptoms reported Is patient diabetic?: Yes Is patient checking blood sugars at home?: No Endocrine Self-Management Outcome: 4 (good)  Gastrointestinal Gastrointestinal Symptoms Reported: No symptoms reported      Genitourinary Genitourinary Symptoms Reported: No symptoms reported    Integumentary Integumentary Symptoms Reported: No symptoms reported    Musculoskeletal Musculoskelatal Symptoms Reviewed: Weakness, Limited mobility, Difficulty walking Musculoskeletal Management Strategies: Adequate rest, Medical device, Medication therapy, Routine screening Musculoskeletal Self-Management Outcome: 4 (good)      Psychosocial Psychosocial Symptoms Reported: No symptoms reported   Major Change/Loss/Stressor/Fears (CP): Medical condition, self Techniques to Cope with Loss/Stress/Change: Spiritual practice(s) Quality of Family Relationships: helpful, involved, supportive Do you feel physically threatened by others?: No    04/14/2024    PHQ2-9 Depression Screening   Jayshawn Colston interest or pleasure in doing things    Feeling down, depressed, or hopeless    PHQ-2 - Total Score    Trouble falling or staying asleep, or sleeping too much    Feeling tired or having Phillippe Orlick energy    Poor appetite or overeating     Feeling bad about yourself - or that you are a failure or  have let yourself or your family down    Trouble concentrating on things, such as reading the newspaper or watching television    Moving or speaking  so slowly that other people could have noticed.  Or the opposite - being so fidgety or restless that you have been moving around a lot more than usual    Thoughts that you would be better off dead, or hurting yourself in some way    PHQ2-9 Total Score    If you checked off any problems, how difficult have these problems made it for you to do your work, take care of things at home, or get along with other people    Depression Interventions/Treatment      There were no vitals filed for this visit.  Medications Reviewed Today     Reviewed by Morgan Clayborne CROME, RN (Registered Nurse) on 04/14/24 at [phone]  Med List Status: <None>   Medication Order Taking? Sig Documenting Provider Last Dose Status Informant  albuterol  (VENTOLIN  HFA) 108 (90 Base) MCG/ACT inhaler [id number]  TAKE 2 PUFFS BY MOUTH EVERY 6 HOURS AS NEEDED FOR WHEEZE OR SHORTNESS OF BREATH Heilingoetter, Cassandra L, PA-C  Active Self  aspirin  EC 81 MG tablet [id number]  Take 81 mg by mouth at bedtime. [provider]  Active Self  atorvastatin  (LIPITOR ) 40 MG tablet [id number]  Take 1 tablet (40 mg total) by mouth daily. Elmira Newman PARAS, MD  Active   BREZTRI  AEROSPHERE 160-9-4.8 MCG/ACT AERO inhaler [id number]  INHALE 2 PUFFS INTO THE LUNGS IN THE MORNING AND ALSO 2 PUFFS AT BEDTIME Shelah Lamar RAMAN, MD  Active Self  estradiol  (ESTRACE ) 0.1 MG/GM vaginal cream [id number]  Place 0.5g twice a week at opening of vagina Marilynne Rosaline SAILOR, MD  Active Self  ferrous sulfate  325 (65 FE) MG EC tablet [id number]  TAKE 1 TABLET BY MOUTH EVERY DAY WITH BREAKFAST Heilingoetter, Cassandra L, PA-C  Active   levothyroxine  (SYNTHROID ) 175 MCG tablet [id number]  TAKE 1 TABLET BY MOUTH EVERY MORNING ON MONDAY - FRIDAY Moore, [name], FNP  Active   lisinopril  (ZESTRIL ) 10 MG tablet [id number]  TAKE 1 TABLET BY MOUTH EVERY DAY Moore, [name], FNP  Active   metoprolol  succinate (TOPROL -XL) 25 MG 24 hr tablet [id number]  Take 1 tablet (25 mg total) by  mouth daily. Arlon Carliss ORN, DO  Active   nitrofurantoin  (MACRODANTIN ) 100 MG capsule [id number]  Take 100 mg by mouth daily. [provider]  Active   nitroGLYCERIN  (NITROSTAT ) 0.4 MG SL tablet [id number]  PLACE 1 TABLET UNDER THE TONGUE EVERY 5 (FIVE) MINUTES X 3 DOSES AS NEEDED FOR CHEST PAIN. [name], FNP  Active Self           Med Note ROMELLE MARCHA MALVA Austin Oct 11, 2023 12:10 PM)    OXYGEN  [id number]  Inhale 3 L into the lungs continuous. [provider]  Active Self  trospium  (SANCTURA ) 20 MG tablet [id number]  Take 1 tablet (20 mg total) by mouth 2 (two) times daily. Marilynne Rosaline SAILOR, MD  Active Self            Recommendation:   Specialty provider follow-up with Dr. Shelah, Pulmonologist for PFT and evaluation of your COPD  Continue Current Plan of Care  Follow Up Plan:   Telephone follow up appointment date/time:  Monday, September 22 at 09:30 AM  Clayborne Morgan RN BSN CCM [place]  [facility], [facility] Health Nurse Care Coordinator  Direct Dial:  [phone] Website: [email or web address]

## 2024-04-14 NOTE — Patient Instructions (Signed)
 Visit Information  Thank you for taking time to visit with me today. Please don't hesitate to contact me if I can be of assistance to you before our next scheduled appointment.  Your next care management appointment is by telephone on Monday, September 22 at 09:30 AM  Please call the care guide team at [phone] if you need to cancel, schedule, or reschedule an appointment.   Please call [phone]-TALK (toll free, 24 hour hotline) if you are experiencing a Mental Health or Behavioral Health Crisis or need someone to talk to.  Clayborne Ly RN BSN CCM [place]  [facility], [facility] Health Nurse Care Coordinator  Direct Dial: [phone] Website: [email or web address]

## 2024-04-14 NOTE — Telephone Encounter (Signed)
 Patient Advocate Encounter   The patient was approved for a [employer] grant that will help cover the cost of REPATHA  Total amount awarded, $2,500.  Effective: 03/15/24 - 03/14/25   APW:[id number] ERW:[id number] Hmnle:[id number] PI:[id number]   Pharmacy provided with approval and processing information.  Ileana Lehmann, CPhT  Pharmacy Patient Advocate Specialist  Direct Number: [phone] Fax: [phone]

## 2024-05-05 DIAGNOSIS — D492 Neoplasm of unspecified behavior of bone, soft tissue, and skin: Secondary | ICD-10-CM | POA: Diagnosis not present

## 2024-05-05 DIAGNOSIS — C44719 Basal cell carcinoma of skin of left lower limb, including hip: Secondary | ICD-10-CM | POA: Diagnosis not present

## 2024-05-05 DIAGNOSIS — L570 Actinic keratosis: Secondary | ICD-10-CM | POA: Diagnosis not present

## 2024-05-05 DIAGNOSIS — L821 Other seborrheic keratosis: Secondary | ICD-10-CM | POA: Diagnosis not present

## 2024-05-05 DIAGNOSIS — D225 Melanocytic nevi of trunk: Secondary | ICD-10-CM | POA: Diagnosis not present

## 2024-05-05 DIAGNOSIS — L814 Other melanin hyperpigmentation: Secondary | ICD-10-CM | POA: Diagnosis not present

## 2024-05-12 ENCOUNTER — Encounter: Payer: Self-pay | Admitting: Emergency Medicine

## 2024-05-12 ENCOUNTER — Ambulatory Visit: Admitting: Emergency Medicine

## 2024-05-12 VITALS — BP 136/72 | HR 69 | Ht 66.0 in | Wt 158.0 lb

## 2024-05-12 DIAGNOSIS — J449 Chronic obstructive pulmonary disease, unspecified: Secondary | ICD-10-CM

## 2024-05-12 DIAGNOSIS — C3490 Malignant neoplasm of unspecified part of unspecified bronchus or lung: Secondary | ICD-10-CM | POA: Diagnosis not present

## 2024-05-12 DIAGNOSIS — J9611 Chronic respiratory failure with hypoxia: Secondary | ICD-10-CM | POA: Diagnosis not present

## 2024-05-12 LAB — PULMONARY FUNCTION TEST
DL/VA % pred: 96 %
DL/VA: 3.97 ml/min/mmHg/L
DLCO cor % pred: 52 %
DLCO cor: 9.04 ml/min/mmHg
DLCO unc % pred: 43 %
DLCO unc: 7.44 ml/min/mmHg
FEF 25-75 Pre: 0.31 L/s
FEF2575-%Pred-Pre: 32 %
FEV1-%Pred-Pre: 48 %
FEV1-Pre: 0.75 L
FEV1FVC-%Pred-Pre: 77 %
FEV6-%Pred-Pre: 65 %
FEV6-Pre: 1.28 L
FEV6FVC-%Pred-Pre: 105 %
FVC-%Pred-Pre: 63 %
FVC-Pre: 1.34 L
Pre FEV1/FVC ratio: 56 %
Pre FEV6/FVC Ratio: 98 %

## 2024-05-12 NOTE — Patient Instructions (Addendum)
 We reviewed your CT scan of the chest. Get your repeat CT chest as per Dr. Jeannett plans. We reviewed your pulmonary function testing today Please restart using your Breztri  2 puffs twice a day.  Rinse and gargle after using. Keep your albuterol  available to use 2 puffs when needed for shortness of breath, chest tightness, spells of coughing. Get your flu shot and your COVID-19 vaccine this fall. If your persistent cough continues to flare then we should consider changing your lisinopril  to an alternative similar blood pressure medication.  We could also consider changing your Breztri  to an alternative inhaler. Continue your oxygen  at 3 L/min with exertion.  When you are at rest you can consider turning it down to 2 L/min or what ever flow rate will keep your oxygen  saturations > 90%. Follow-up in our office in 6 months, sooner if you have any problems.

## 2024-05-12 NOTE — Assessment & Plan Note (Signed)
 Continue your oxygen  at 3 L/min with exertion.  When you are at rest you can consider turning it down to 2 L/min or what ever flow rate will keep your oxygen  saturations > 90%.

## 2024-05-12 NOTE — Assessment & Plan Note (Addendum)
 She has not been using her Breztri  reliably.  Difficult to tell why not but it sounds like it has been contributing some to her chronic cough.  I like to try to get her back on it.  She agrees to restart it reliably.  If she does have cough we could consider changing to an alternative.  Of note she is on lisinopril , may need to change this as well.   We reviewed your pulmonary function testing today Please restart using your Breztri  2 puffs twice a day.  Rinse and gargle after using. Keep your albuterol  available to use 2 puffs when needed for shortness of breath, chest tightness, spells of coughing. Get your flu shot and your COVID-19 vaccine this fall. If your persistent cough continues to flare then we should consider changing your lisinopril  to an alternative similar blood pressure medication.  We could also consider changing your Breztri  to an alternative inhaler. Follow-up in our office in 6 months, sooner if you have any problems.

## 2024-05-12 NOTE — Patient Instructions (Signed)
 Spirometry and diffusion capacity performed today.

## 2024-05-12 NOTE — Progress Notes (Signed)
 Spirometry and diffusion capacity performed today.

## 2024-05-12 NOTE — Progress Notes (Signed)
 Subjective:    Patient ID: Abigail [name], female    DOB: Jan 29, 1936, 88 y.o.   MRN: [id number]  COPD She complains of cough. Her past medical history is significant for COPD.  Cough Her past medical history is significant for COPD.    ROV 05/12/2024 --follow-up visit for 88 year old woman with a history of COPD and stage IIa left lower lobe adenocarcinoma.  She was treated with chemoradiation and then Imfinzi  completed in 2023.  PMH otherwise significant for CAD, hypertension, CKD.  She has chronic hypoxemia on 3 L/min.  She is on lisinopril , nitrofurantoin  Currently managed on Breztri  - but she has not been using regularly, uses albuterol  about once a day.  She has ups and downs - gets significant exertional SOB when she exerts, for example carrying her small dog. She has daily cough, can be productive of clear mucous. No real GERD. Has some nasal congestion in the am when she wakes up.   Pulmonary function testing performed today and reviewed by me, shows very severe obstruction.  Bronchodilator was not tested.  Her diffusion capacity was decreased and corrected to the normal range when adjusted for alveolar volume.  FEV1 0.75 L (48% predicted)  CT chest 04/04/2024 reviewed by me shows some linear bandlike changes in the left perihilar region consistent with treatment changes, little change from prior.  There is a small left pleural effusion.  No new nodules or opacities seen   Review of Systems  Respiratory:  Positive for cough.    As per HPi  Past Medical History:  Diagnosis Date   Anemia    Arthritis    knees, back   Bacteremia 04/08/2012   CAP (community acquired pneumonia) 04/12/2012   Carotid artery occlusion    Chronic kidney disease    stage 3 ckd no nephrologist   Complete uterine prolapse with prolapse of anterior vaginal wall    Complication of anesthesia    hard to wake up per pt   Constipation    Coronary artery disease    Diverticulitis yrs ago coialitis    Dyspnea    History of blood transfusion    History of radiation therapy    right lung 08/05/2021-09/18/2021  Dr Lynwood Nasuti   Hypertension    Hypothyroid    LLQ abdominal pain 04/05/2012   Lung cancer (HCC)    Numbness    in hands at times   Pneumonia    Pre-diabetes    Scoliosis    STEMI (ST elevation myocardial infarction) (HCC) 10/26/2019   DES RCA   Wears dentures    full dentures   Wears glasses    for reading     Family History  Problem Relation Age of Onset   Hypertension Mother    Colon cancer Neg Hx    Stomach cancer Neg Hx    Esophageal cancer Neg Hx      Social History   Socioeconomic History   Marital status: Widowed    Spouse name: Not on file   Number of children: Not on file   Years of education: Not on file   Highest education level: Not on file  Occupational History   Occupation: semi retired  Tobacco Use   Smoking status: Former    Current packs/day: 0.00    Average packs/day: 1.5 packs/day for 35.0 years (52.5 ttl pk-yrs)    Types: Cigarettes    Start date: 04/06/1951    Quit date: 04/05/1986    Years since quitting: 34.1  Smokeless tobacco: Never  Vaping Use   Vaping status: Never Used  Substance and Sexual Activity   Alcohol use: No   Drug use: No   Sexual activity: Not Currently    Birth control/protection: Post-menopausal  Other Topics Concern   Not on file  Social History Narrative   Not on file   Social Drivers of Health   Financial Resource Strain: Medium Risk (03/09/2024)   Overall Financial Resource Strain (CARDIA)    Difficulty of Paying Living Expenses: Somewhat hard  Food Insecurity: No Food Insecurity (03/09/2024)   Hunger Vital Sign    Worried About Running Out of Food in the Last Year: Never true    Ran Out of Food in the Last Year: Never true  Transportation Needs: No Transportation Needs (03/09/2024)   PRAPARE - Administrator, Civil Service (Medical): No    Lack of Transportation (Non-Medical): No   Recent Concern: Transportation Needs - Unmet Transportation Needs (01/29/2024)   PRAPARE - Administrator, Civil Service (Medical): Yes    Lack of Transportation (Non-Medical): No  Physical Activity: Inactive (03/09/2024)   Exercise Vital Sign    Days of Exercise per Week: 0 days    Minutes of Exercise per Session: 0 min  Stress: No Stress Concern Present (03/09/2024)   [employer] of Occupational Health - Occupational Stress Questionnaire    Feeling of Stress: Only a little  Recent Concern: Stress - Stress Concern Present (03/09/2024)   [employer] of Occupational Health - Occupational Stress Questionnaire    Feeling of Stress: To some extent  Social Connections: Moderately Isolated (03/09/2024)   Social Connection and Isolation Panel    Frequency of Communication with Friends and Family: More than three times a week    Frequency of Social Gatherings with Friends and Family: Once a week    Attends Religious Services: More than 4 times per year    Active Member of [employer] or Organizations: No    Attends Banker Meetings: Never    Marital Status: Widowed  Intimate Partner Violence: Not At Risk (03/09/2024)   Humiliation, Afraid, Rape, and Kick questionnaire    Fear of Current or Ex-Partner: No    Emotionally Abused: No    Physically Abused: No    Sexually Abused: No     Allergies  Allergen Reactions   Codeine Nausea And Vomiting   Norvasc  [Amlodipine ] Rash and Other (See Comments)    rash     Outpatient Medications Prior to Visit  Medication Sig Dispense Refill   albuterol  (VENTOLIN  HFA) 108 (90 Base) MCG/ACT inhaler TAKE 2 PUFFS BY MOUTH EVERY 6 HOURS AS NEEDED FOR WHEEZE OR SHORTNESS OF BREATH 8.5 each 1   aspirin  EC 81 MG tablet Take 81 mg by mouth at bedtime.     atorvastatin  (LIPITOR ) 80 MG tablet Take 80 mg by mouth daily.     BREZTRI  AEROSPHERE 160-9-4.8 MCG/ACT AERO inhaler INHALE 2 PUFFS INTO THE LUNGS IN THE MORNING AND ALSO 2 PUFFS AT  BEDTIME 10.7 each 6   estradiol  (ESTRACE ) 0.1 MG/GM vaginal cream Place 0.5g twice a week at opening of vagina 42.5 g 11   Evolocumab  (REPATHA  SURECLICK) 140 MG/ML SOAJ Inject 140 mg into the skin every 14 (fourteen) days. 6 mL 3   ferrous sulfate  325 (65 FE) MG EC tablet TAKE 1 TABLET BY MOUTH EVERY DAY WITH BREAKFAST 30 tablet 2   levothyroxine  (SYNTHROID ) 175 MCG tablet TAKE 1 TABLET BY MOUTH  EVERY MORNING ON MONDAY - FRIDAY 90 tablet 1   lisinopril  (ZESTRIL ) 10 MG tablet TAKE 1 TABLET BY MOUTH EVERY DAY 90 tablet 1   metoprolol  succinate (TOPROL -XL) 50 MG 24 hr tablet Take 50 mg by mouth daily.     nitrofurantoin  (MACRODANTIN ) 100 MG capsule Take 100 mg by mouth daily.     nitroGLYCERIN  (NITROSTAT ) 0.4 MG SL tablet PLACE 1 TABLET UNDER THE TONGUE EVERY 5 (FIVE) MINUTES X 3 DOSES AS NEEDED FOR CHEST PAIN. 25 tablet 3   OXYGEN  Inhale 3 L into the lungs continuous.     trospium  (SANCTURA ) 20 MG tablet Take 1 tablet (20 mg total) by mouth 2 (two) times daily. 180 tablet 3   atorvastatin  (LIPITOR ) 40 MG tablet Take 1 tablet (40 mg total) by mouth daily. 90 tablet 3   metoprolol  succinate (TOPROL -XL) 25 MG 24 hr tablet Take 1 tablet (25 mg total) by mouth daily. 90 tablet 0   No facility-administered medications prior to visit.         Objective:   Physical Exam Vitals:   05/12/24 1545  BP: 136/72  Pulse: 69  SpO2: 99%  Weight: 158 lb (71.7 kg)  Height: 5' 6 (1.676 m)   Gen: Pleasant, well-nourished, in no distress,  normal affect  ENT: No lesions,  mouth clear,  oropharynx clear, no postnasal drip  Neck: No JVD, no stridor  Lungs: No use of accessory muscles, no wheeze, soft L mid lung insp crackles, no wheeze on forced expiration  Cardiovascular: RRR, heart sounds normal, no murmur or gallops, no peripheral edema  Musculoskeletal: No deformities, no cyanosis or clubbing  Neuro: alert, awake, non focal  Skin: Warm, no lesions or rash      Assessment & Plan:  Malignant  neoplasm of unspecified part of unspecified bronchus or lung (HCC) CT chest with stable posttreatment changes.  Plan to continue observation.  She has follow-up and repeat scan planned with Dr. Sherrod   COPD (chronic obstructive pulmonary disease) [facility]) She has not been using her Breztri  reliably.  Difficult to tell why not but it sounds [name] it has been contributing some to her chronic cough.  I [name] to try to get her back on it.  She agrees to restart it reliably.  If she does have cough we could consider changing to an alternative.  Of note she is on lisinopril , may need to change this as well.   We reviewed your pulmonary function testing today Please restart using your Breztri  2 puffs twice a day.  Rinse and gargle after using. Keep your albuterol  available to use 2 puffs when needed for shortness of breath, chest tightness, spells of coughing. Get your flu shot and your COVID-19 vaccine this fall. If your persistent cough continues to flare then we should consider changing your lisinopril  to an alternative similar blood pressure medication.  We could also consider changing your Breztri  to an alternative inhaler. Follow-up in our office in 6 months, sooner if you have any problems.  Chronic hypoxic respiratory failure (HCC) Continue your oxygen  at 3 L/min with exertion.  When you are at rest you can consider turning it down to 2 L/min or what ever flow rate will keep your oxygen  saturations > 90%.  Time spent 37 minutes reviewing symptoms, medications, her oxygen  usage and needs.  Reviewed therapeutic options and medication recommendations  Lamar Chris, MD, PhD 05/12/2024, 4:37 PM [facility] [phone] or if no answer before 7:00PM call [phone]  For any issues after 7:00PM please call eLink [phone]

## 2024-05-12 NOTE — Assessment & Plan Note (Signed)
 CT chest with stable posttreatment changes.  Plan to continue observation.  She has follow-up and repeat scan planned with Dr. Sherrod

## 2024-05-16 ENCOUNTER — Other Ambulatory Visit: Payer: Self-pay

## 2024-05-16 NOTE — Patient Outreach (Signed)
 Complex Care Management   Visit Note  05/16/2024  Name:  Abigail [name] MRN: [id number] DOB: April 19, 1936  Situation: Referral received for Complex Care Management related to  Hypertensive heart and Renal disease, Recurrent UTI, Renal Calculus, Chronic Kidney Disease, Stage 3b, Hypothyroidism, Malignant neoplasm of unspecified part of unspecified bronchus or lung, Anemia. I obtained verbal consent from Patient.  Visit completed with Patient on the phone.  Background:   Past Medical History:  Diagnosis Date   Anemia    Arthritis    knees, back   Bacteremia 04/08/2012   CAP (community acquired pneumonia) 04/12/2012   Carotid artery occlusion    Chronic kidney disease    stage 3 ckd no nephrologist   Complete uterine prolapse with prolapse of anterior vaginal wall    Complication of anesthesia    hard to wake up per pt   Constipation    Coronary artery disease    Diverticulitis yrs ago coialitis   Dyspnea    History of blood transfusion    History of radiation therapy    right lung 08/05/2021-09/18/2021  Dr Lynwood Nasuti   Hypertension    Hypothyroid    LLQ abdominal pain 04/05/2012   Lung cancer (HCC)    Numbness    in hands at times   Pneumonia    Pre-diabetes    Scoliosis    STEMI (ST elevation myocardial infarction) (HCC) 10/26/2019   DES RCA   Wears dentures    full dentures   Wears glasses    for reading    Assessment: Patient Reported Symptoms:  Cognitive Cognitive Status: Alert and oriented to person, place, and time, Normal speech and language skills Cognitive/Intellectual Conditions Management [RPT]: None reported or documented in medical history or problem list   Health Maintenance Behaviors: Annual physical exam, Healthy diet, Sleep adequate Health Facilitated by: Rest, Healthy diet  Neurological Neurological Review of Symptoms: Weakness Neurological Management Strategies: Routine screening, Adequate rest Neurological Self-Management Outcome: 3 (uncertain)   HEENT HEENT Symptoms Reported: No symptoms reported      Cardiovascular Cardiovascular Symptoms Reported: Fainting, Lightheadness Does patient have uncontrolled Hypertension?: No Cardiovascular Management Strategies: Routine screening, Adequate rest Cardiovascular Self-Management Outcome: 3 (uncertain) Cardiovascular Comment: Message sent to PCP, appointment scheduled with NP for 05/19/24  Respiratory Respiratory Symptoms Reported: Productive cough, Shortness of breath Respiratory Management Strategies: Routine screening, Oxygen  therapy, Medication therapy Respiratory Self-Management Outcome: 4 (good)  Endocrine Endocrine Symptoms Reported: No symptoms reported Is patient diabetic?: No    Gastrointestinal Gastrointestinal Symptoms Reported: No symptoms reported      Genitourinary Genitourinary Symptoms Reported: Pain/burning with urination Additional Genitourinary Details: patient plans to contact her PCP for a urine check Genitourinary Management Strategies: Fluid modification Genitourinary Self-Management Outcome: 4 (good)  Integumentary Integumentary Symptoms Reported: Skin changes Additional Integumentary Details: skin changes noted on back of left hand, over lip and to shin on left leg, left shin bx: revealed a form of skin cancer and patient will f/u with provider to ensure all cancer cells were removed; Provider is Dr. Elnor with [facility] Skin Management Strategies: Routine screening, Dressing changes Skin Self-Management Outcome: 3 (uncertain) Skin Comment: using vaseline and keeping covered with dry dressing  Musculoskeletal Musculoskelatal Symptoms Reviewed: Weakness, Limited mobility Musculoskeletal Management Strategies: Adequate rest, Medical device, Routine screening Musculoskeletal Self-Management Outcome: 4 (good)      Psychosocial Psychosocial Symptoms Reported: No symptoms reported     Quality of Family Relationships: helpful, involved, supportive Do you  feel physically threatened by others?:  No    05/16/2024    PHQ2-9 Depression Screening   Lakeithia Rasor interest or pleasure in doing things    Feeling down, depressed, or hopeless    PHQ-2 - Total Score    Trouble falling or staying asleep, or sleeping too much    Feeling tired or having Aryn Safran energy    Poor appetite or overeating     Feeling bad about yourself - or that you are a failure or have let yourself or your family down    Trouble concentrating on things, such as reading the newspaper or watching television    Moving or speaking so slowly that other people could have noticed.  Or the opposite - being so fidgety or restless that you have been moving around a lot more than usual    Thoughts that you would be better off dead, or hurting yourself in some way    PHQ2-9 Total Score    If you checked off any problems, how difficult have these problems made it for you to do your work, take care of things at home, or get along with other people    Depression Interventions/Treatment      There were no vitals filed for this visit.  Medications Reviewed Today     Reviewed by Morgan Clayborne CROME, RN (Registered Nurse) on 05/16/24 at [phone]  Med List Status: <None>   Medication Order Taking? Sig Documenting Provider Last Dose Status Informant  albuterol  (VENTOLIN  HFA) 108 (90 Base) MCG/ACT inhaler [id number]  TAKE 2 PUFFS BY MOUTH EVERY 6 HOURS AS NEEDED FOR WHEEZE OR SHORTNESS OF BREATH Heilingoetter, Cassandra L, PA-C  Active Self  aspirin  EC 81 MG tablet [id number]  Take 81 mg by mouth at bedtime. [provider]  Active Self  atorvastatin  (LIPITOR ) 80 MG tablet [id number]  Take 80 mg by mouth daily. [provider]  Active   BREZTRI  AEROSPHERE 160-9-4.8 MCG/ACT AERO inhaler [id number]  INHALE 2 PUFFS INTO THE LUNGS IN THE MORNING AND ALSO 2 PUFFS AT BEDTIME Shelah Lamar RAMAN, MD  Active Self  estradiol  (ESTRACE ) 0.1 MG/GM vaginal cream [id number]  Place 0.5g twice a week at opening of  vagina Marilynne Rosaline SAILOR, MD  Active Self  Evolocumab  (REPATHA  SURECLICK) 140 MG/ML SOAJ [id number]  Inject 140 mg into the skin every 14 (fourteen) days. Elmira Newman PARAS, MD  Active   ferrous sulfate  325 (65 FE) MG EC tablet [id number]  TAKE 1 TABLET BY MOUTH EVERY DAY WITH BREAKFAST Heilingoetter, Cassandra L, PA-C  Active   levothyroxine  (SYNTHROID ) 175 MCG tablet [id number]  TAKE 1 TABLET BY MOUTH EVERY MORNING ON MONDAY - FRIDAY Moore, [name], FNP  Active   lisinopril  (ZESTRIL ) 10 MG tablet [id number]  TAKE 1 TABLET BY MOUTH EVERY DAY Moore, [name], FNP  Active   metoprolol  succinate (TOPROL -XL) 50 MG 24 hr tablet [id number]  Take 50 mg by mouth daily. [provider]  Active   nitrofurantoin  (MACRODANTIN ) 100 MG capsule [id number]  Take 100 mg by mouth daily. [provider]  Active   nitroGLYCERIN  (NITROSTAT ) 0.4 MG SL tablet [id number]  PLACE 1 TABLET UNDER THE TONGUE EVERY 5 (FIVE) MINUTES X 3 DOSES AS NEEDED FOR CHEST PAIN. [name], FNP  Active Self           Med Note ROMELLE MARCHA MALVA Austin Oct 11, 2023 12:10 PM)    OXYGEN  [id number]  Inhale 3 L into the lungs continuous. [provider]  Active Self  trospium  (SANCTURA )  20 MG tablet [id number]  Take 1 tablet (20 mg total) by mouth 2 (two) times daily. Marilynne Rosaline SAILOR, MD  Active Self            Recommendation:   PCP Follow-up with Bruna Creighton NP on 05/19/24 at 2:00 PM Lab requests: CBC Diagnostic requests: urinalysis/urine culture   Follow Up Plan:   Telephone follow up appointment date/time:  Wednesday, October 15 at 09:30 AM  Clayborne Ly RN BSN CCM [place]  [facility], [facility] Nurse Care Coordinator  Direct Dial: [phone] Website: [email or web address]

## 2024-05-16 NOTE — Patient Instructions (Signed)
 Visit Information  Thank you for taking time to visit with me today. Please don't hesitate to contact me if I can be of assistance to you before our next scheduled appointment.  Your next care management appointment is by telephone on Wednesday, October 15 at 09:30 AM  Please call the care guide team at [phone] if you need to cancel, schedule, or reschedule an appointment.   Please call [phone]-TALK (toll free, 24 hour hotline) if you are experiencing a Mental Health or Behavioral Health Crisis or need someone to talk to.  Clayborne Ly RN BSN CCM [place]  [facility], [facility] Nurse Care Coordinator  Direct Dial: [phone] Website: [email or web address]

## 2024-05-19 ENCOUNTER — Ambulatory Visit (INDEPENDENT_AMBULATORY_CARE_PROVIDER_SITE_OTHER): Payer: Self-pay | Admitting: Family Medicine

## 2024-05-19 ENCOUNTER — Encounter: Payer: Self-pay | Admitting: Family Medicine

## 2024-05-19 VITALS — BP 130/64 | HR 74 | Temp 98.2°F | Ht 66.0 in | Wt 160.0 lb

## 2024-05-19 DIAGNOSIS — I5033 Acute on chronic diastolic (congestive) heart failure: Secondary | ICD-10-CM | POA: Diagnosis not present

## 2024-05-19 DIAGNOSIS — J410 Simple chronic bronchitis: Secondary | ICD-10-CM

## 2024-05-19 DIAGNOSIS — D509 Iron deficiency anemia, unspecified: Secondary | ICD-10-CM | POA: Diagnosis not present

## 2024-05-19 DIAGNOSIS — R300 Dysuria: Secondary | ICD-10-CM

## 2024-05-19 DIAGNOSIS — I130 Hypertensive heart and chronic kidney disease with heart failure and stage 1 through stage 4 chronic kidney disease, or unspecified chronic kidney disease: Secondary | ICD-10-CM | POA: Diagnosis not present

## 2024-05-19 DIAGNOSIS — Z85118 Personal history of other malignant neoplasm of bronchus and lung: Secondary | ICD-10-CM

## 2024-05-19 DIAGNOSIS — N390 Urinary tract infection, site not specified: Secondary | ICD-10-CM | POA: Diagnosis not present

## 2024-05-19 DIAGNOSIS — N1832 Chronic kidney disease, stage 3b: Secondary | ICD-10-CM

## 2024-05-19 LAB — POCT URINALYSIS DIP (CLINITEK)
Bilirubin, UA: NEGATIVE
Glucose, UA: NEGATIVE mg/dL
Ketones, POC UA: NEGATIVE mg/dL
Nitrite, UA: NEGATIVE
Spec Grav, UA: 1.015 (ref 1.010–1.025)
Urobilinogen, UA: 0.2 U/dL
pH, UA: 6.5 (ref 5.0–8.0)

## 2024-05-19 NOTE — Progress Notes (Signed)
 I,Jameka J Llittleton, CMA,acting as a Neurosurgeon for [employer], NP.,have documented all relevant documentation on the behalf of Abigail Creighton, NP,as directed by  Abigail Creighton, NP while in the presence of Abigail Creighton, NP.  Subjective:  Patient ID: Abigail [name] , female    DOB: 03-Apr-1936 , 88 y.o.   MRN: [id number]  Chief Complaint  Patient presents with   Dysuria    Patient presents today for a possible uti. She has been exoeriencing burning, frequency and her urine looks hazy. She just doesn't feel well.    other    Patient would also [name] for her iron  to be checked.     HPI Discussed the use of AI scribe software for clinical note transcription with the patient, who gave verbal consent to proceed.  History of Present Illness     Abigail [name] is an 87 year old female with a history of lung cancer ,COPD, hypertension with CHF and CKD stage 3B who presents with symptoms of a urinary tract infection and concerns about anemia.  She has been experiencing dysuria, increased urinary frequency, and general malaise. She was hospitalized in early June for a urinary tract infection, requiring a 7-day course of IV antibiotics.  She has a history of anemia and is currently taking iron  tablets daily. Her hemoglobin levels have recently decreased from 9.1 to 8.8 over the past three months. She previously received iron  infusions.  She completed treatment for lung cancer, which included 24 sessions of radiation, 9 chemotherapy treatments, and 13 months of immunotherapy, with the last session in March of the previous year. She experiences coughing spells, which she first noticed in church. She mentioned that someone else suggested mold as a possible cause, but she is unsure of the reason. She uses an inhaler as needed and takes lisinopril , atorvastatin , and twice a month Repatha  injections for cholesterol.  She lives mostly alone with her dog, with her son occasionally visiting.  Urine will be sent to [facility]  laboratory for culture in the next 24 hours because of recurrent UTIs to avoid resistance.  Patient voiced understanding and agreed.     Past Medical History:  Diagnosis Date   Anemia    Arthritis    knees, back   Bacteremia 04/08/2012   CAP (community acquired pneumonia) 04/12/2012   Carotid artery occlusion    Chronic kidney disease    stage 3 ckd no nephrologist   Complete uterine prolapse with prolapse of anterior vaginal wall    Complication of anesthesia    hard to wake up per pt   Constipation    Coronary artery disease    Diverticulitis yrs ago coialitis   Dyspnea    History of blood transfusion    History of radiation therapy    right lung 08/05/2021-09/18/2021  Dr Lynwood Nasuti   Hypertension    Hypothyroid    LLQ abdominal pain 04/05/2012   Lung cancer (HCC)    Numbness    in hands at times   Pneumonia    Pre-diabetes    Scoliosis    STEMI (ST elevation myocardial infarction) (HCC) 10/26/2019   DES RCA   Wears dentures    full dentures   Wears glasses    for reading     Family History  Problem Relation Age of Onset   Hypertension Mother    Colon cancer Neg Hx    Stomach cancer Neg Hx    Esophageal cancer Neg Hx  Current Outpatient Medications:    albuterol  (VENTOLIN  HFA) 108 (90 Base) MCG/ACT inhaler, TAKE 2 PUFFS BY MOUTH EVERY 6 HOURS AS NEEDED FOR WHEEZE OR SHORTNESS OF BREATH, Disp: 8.5 each, Rfl: 1   aspirin  EC 81 MG tablet, Take 81 mg by mouth at bedtime., Disp: , Rfl:    atorvastatin  (LIPITOR ) 80 MG tablet, Take 80 mg by mouth daily., Disp: , Rfl:    BREZTRI  AEROSPHERE 160-9-4.8 MCG/ACT AERO inhaler, INHALE 2 PUFFS INTO THE LUNGS IN THE MORNING AND ALSO 2 PUFFS AT BEDTIME, Disp: 10.7 each, Rfl: 6   estradiol  (ESTRACE ) 0.1 MG/GM vaginal cream, Place 0.5g twice a week at opening of vagina, Disp: 42.5 g, Rfl: 11   Evolocumab  (REPATHA  SURECLICK) 140 MG/ML SOAJ, Inject 140 mg into the skin every 14 (fourteen) days., Disp: 6 mL, Rfl: 3    ferrous sulfate  325 (65 FE) MG EC tablet, TAKE 1 TABLET BY MOUTH EVERY DAY WITH BREAKFAST, Disp: 30 tablet, Rfl: 2   levothyroxine  (SYNTHROID ) 175 MCG tablet, TAKE 1 TABLET BY MOUTH EVERY MORNING ON MONDAY - FRIDAY, Disp: 90 tablet, Rfl: 1   lisinopril  (ZESTRIL ) 10 MG tablet, TAKE 1 TABLET BY MOUTH EVERY DAY, Disp: 90 tablet, Rfl: 1   metoprolol  succinate (TOPROL -XL) 50 MG 24 hr tablet, Take 50 mg by mouth daily., Disp: , Rfl:    nitrofurantoin  (MACRODANTIN ) 100 MG capsule, Take 100 mg by mouth daily., Disp: , Rfl:    nitroGLYCERIN  (NITROSTAT ) 0.4 MG SL tablet, PLACE 1 TABLET UNDER THE TONGUE EVERY 5 (FIVE) MINUTES X 3 DOSES AS NEEDED FOR CHEST PAIN., Disp: 25 tablet, Rfl: 3   OXYGEN , Inhale 3 L into the lungs continuous., Disp: , Rfl:    trospium  (SANCTURA ) 20 MG tablet, Take 1 tablet (20 mg total) by mouth 2 (two) times daily., Disp: 180 tablet, Rfl: 3   amoxicillin  (AMOXIL ) 500 MG tablet, Take 1 tablet (500 mg total) by mouth 2 (two) times daily., Disp: 10 tablet, Rfl: 0   Allergies  Allergen Reactions   Codeine Nausea And Vomiting   Norvasc  [Amlodipine ] Rash and Other (See Comments)    rash     Review of Systems  Constitutional: Negative.   HENT: Negative.    Respiratory:  Positive for cough and shortness of breath.   Gastrointestinal: Negative.   Genitourinary:  Positive for dysuria, frequency and urgency.  Musculoskeletal:  Positive for gait problem.       She had a walker     Today's Vitals   05/19/24 1410  BP: 130/64  Pulse: 74  Temp: 98.2 F (36.8 C)  TempSrc: Oral  SpO2: 96%  Weight: 160 lb (72.6 kg)  Height: 5' 6 (1.676 m)  PainSc: 0-No pain   Body mass index is 25.82 kg/m.  Wt Readings from Last 3 Encounters:  05/19/24 160 lb (72.6 kg)  05/12/24 158 lb (71.7 kg)  04/11/24 158 lb (71.7 kg)    The ASCVD Risk score (Arnett DK, et al., 2019) failed to calculate for the following reasons:   The 2019 ASCVD risk score is only valid for ages 44 to 53   Risk score  cannot be calculated because patient has a medical history suggesting prior/existing ASCVD  Objective:  Physical Exam HENT:     Head: Normocephalic.  Cardiovascular:     Rate and Rhythm: Normal rate.  Pulmonary:     Effort: Pulmonary effort is normal.     Breath sounds: Rhonchi present.  Neurological:     General: No  focal deficit present.     Mental Status: She is alert and oriented to person, place, and time.         Assessment And Plan:  Dysuria Assessment & Plan: Urine culture needed for antibiotic sensitivity due to resistance concerns. - Send urine for culture. - Advise adequate hydration.  Orders: -     POCT URINALYSIS DIP (CLINITEK)  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 stage 3b chronic kidney disease (HCC) Assessment & Plan: Chronic. Stable. Followed by Cardiology and continue current medications  Orders: -     CBC  Chronic iron  deficiency anemia Assessment & Plan: Slight hemoglobin decrease from 9.1 to 8.8 over three months. - Order blood work for iron  levels.  Orders: -     Iron  and TIBC  Simple chronic bronchitis (HCC) Assessment & Plan:   Continue using 3 L Oxygen  N/C - Monitor cough, report to pulmonologist if it persistent.   H/O: lung cancer Assessment & Plan: She has radiation, chemotherapy and immunotherapy.  -Sessions ended March 2024 per patient.  -On supplemental oxygen  3L [place]    Assessment & Plan Urinary tract infection Symptoms of dysuria, increased frequency, and malaise. Urine culture needed for antibiotic sensitivity due to resistance concerns. - Send urine for culture. - Advise adequate hydration.  Chronic obstructive pulmonary disease (COPD) Coughing spells likely due to COPD, not lisinopril . Environmental factors may exacerbate. - Monitor cough, report to pulmonologist if persistent.  Chronic iron  deficiency anemia Slight hemoglobin decrease from 9.1 to 8.8  over three months. - Order blood work for iron  levels.  Hypertension Lisinopril  use under consideration due to cough.  Hyperlipidemia Managed with atorvastatin  and injectable cholesterol medication.   Return if symptoms worsen or fail to improve, for keep next appt.  Patient was given opportunity to ask questions. Patient verbalized understanding of the plan and was able to repeat key elements of the plan. All questions were answered to their satisfaction.    I, Abigail Creighton, NP, have reviewed all documentation for this visit. The documentation on 05/29/2024 for the exam, diagnosis, procedures, and orders are all accurate and complete.   IF YOU HAVE BEEN REFERRED TO A SPECIALIST, IT MAY TAKE 1-2 WEEKS TO SCHEDULE/PROCESS THE REFERRAL. IF YOU HAVE NOT HEARD FROM US /SPECIALIST IN TWO WEEKS, PLEASE GIVE US  A CALL AT [phone].

## 2024-05-20 ENCOUNTER — Telehealth: Payer: Self-pay | Admitting: Nurse Practitioner

## 2024-05-20 ENCOUNTER — Ambulatory Visit: Payer: Self-pay

## 2024-05-20 DIAGNOSIS — C3490 Malignant neoplasm of unspecified part of unspecified bronchus or lung: Secondary | ICD-10-CM | POA: Diagnosis not present

## 2024-05-20 DIAGNOSIS — J9610 Chronic respiratory failure, unspecified whether with hypoxia or hypercapnia: Secondary | ICD-10-CM | POA: Diagnosis not present

## 2024-05-20 DIAGNOSIS — J449 Chronic obstructive pulmonary disease, unspecified: Secondary | ICD-10-CM | POA: Diagnosis not present

## 2024-05-20 LAB — CBC
Hematocrit: 30.4 % — ABNORMAL LOW (ref 34.0–46.6)
Hemoglobin: 9 g/dL — ABNORMAL LOW (ref 11.1–15.9)
MCH: 27.5 pg (ref 26.6–33.0)
MCHC: 29.6 g/dL — ABNORMAL LOW (ref 31.5–35.7)
MCV: 93 fL (ref 79–97)
Platelets: 322 x10E3/uL (ref 150–450)
RBC: 3.27 x10E6/uL — ABNORMAL LOW (ref 3.77–5.28)
RDW: 14.6 % (ref 11.7–15.4)
WBC: 7 x10E3/uL (ref 3.4–10.8)

## 2024-05-20 LAB — IRON AND TIBC
Iron Saturation: 67 % — ABNORMAL HIGH (ref 15–55)
Iron: 239 ug/dL — ABNORMAL HIGH (ref 27–139)
Total Iron Binding Capacity: 357 ug/dL (ref 250–450)
UIBC: 118 ug/dL (ref 118–369)

## 2024-05-20 NOTE — Telephone Encounter (Signed)
 Patient called in reference to getting an antibiotic for her possible UTI, we are awaiting PCR results and should hear back in the next few hours or as late as the morning. Will call her back with further instructions once results received.

## 2024-05-20 NOTE — Telephone Encounter (Signed)
 FYI Only or Action Required?: Action required by provider: Medication request.  Patient was last seen in primary care on 05/19/2024 by Petrina Pries, NP.  Called Nurse Triage reporting Results.  Symptoms are: unchanged.  Triage Disposition: Call PCP When Office is Open  Patient/caregiver understands and will follow disposition?: Yes      Copied from CRM [id number]. Topic: Clinical - Red Word Triage >> May 20, 2024  5:14 PM Lauren C wrote: Red Word that prompted transfer to Nurse Triage: Pt had appointment today, burning with urination, uti symptoms. Pt was supposed to get a call today with urine results and meds, but no one has called and nothing has been sent in yet. Message sent to [facility] clinical as well. Please relay urine results. Reason for Disposition  [1] Caller requesting NON-URGENT health information AND [2] PCP's office is the best resource  Answer Assessment - Initial Assessment Questions Urine results relayed to patient and patient told she has trace blood and leucocytes or bacteria in her urine. Patient verbalized understanding. This RN attempted twice to call on call provider for clinic in regards to medication being sent in for patient. Will route to office for follow up. Pharmacy of choice listed below    [id number] [place], [place] - [address] [facility] [address]  [address], [place] [id number]    1. REASON FOR CALL: What is the main reason for your call? or How can I best help you?     Patient requesting results from her labs yesterday.  2. SYMPTOMS : Do you have any symptoms?      burning with urination and uti symptoms. 3. OTHER QUESTIONS: Do you have any other questions?     Patient requesting for medication to be called in for her symptoms.  Protocols used: Information Only Call - No Triage-A-AH

## 2024-05-20 NOTE — Telephone Encounter (Signed)
 This RN was able to get in touch with [facility] on call service regarding mediation for patient.  Message left with answering service regarding patient's concern and urine results.

## 2024-05-21 ENCOUNTER — Other Ambulatory Visit: Payer: Self-pay | Admitting: Nurse Practitioner

## 2024-05-21 ENCOUNTER — Other Ambulatory Visit: Payer: Self-pay | Admitting: Obstetrics and Gynecology

## 2024-05-21 DIAGNOSIS — N3281 Overactive bladder: Secondary | ICD-10-CM

## 2024-05-21 DIAGNOSIS — N3001 Acute cystitis with hematuria: Secondary | ICD-10-CM

## 2024-05-21 MED ORDER — FOSFOMYCIN TROMETHAMINE 3 G PO PACK: 3.0000 g | PACK | Freq: Once | ORAL | 0 refills | Status: AC

## 2024-05-21 MED ORDER — AMOXICILLIN 500 MG PO TABS: 500.0000 mg | ORAL_TABLET | Freq: Two times a day (BID) | ORAL | 0 refills | Status: DC

## 2024-05-21 NOTE — Progress Notes (Signed)
 Called patient back with urine culture PCR she has 3 bacterias to include pseudomonas,  there is resistance seen as well. Will send Rx for amoxicillin  for 5 days renal dose and fosfomycin once. Patient verbalizes understanding.

## 2024-05-24 ENCOUNTER — Telehealth: Payer: Self-pay

## 2024-05-24 NOTE — Telephone Encounter (Signed)
 Copied from CRM [id number]. Topic: Clinical - Lab/Test Results >> May 20, 2024  5:08 PM Lauren C wrote: Reason for CRM: Pt calling to try to get urine results, still having uti symptoms. Had appt today. No meds for pt are called in yet and she said she was supposed to get a call this afternoon.

## 2024-05-27 DIAGNOSIS — J449 Chronic obstructive pulmonary disease, unspecified: Secondary | ICD-10-CM | POA: Diagnosis not present

## 2024-05-31 ENCOUNTER — Ambulatory Visit: Payer: Self-pay | Admitting: Family Medicine

## 2024-05-31 NOTE — Assessment & Plan Note (Signed)
 Chronic. Stable. Followed by Cardiology and continue current medications

## 2024-05-31 NOTE — Assessment & Plan Note (Signed)
 Slight hemoglobin decrease from 9.1 to 8.8 over three months. - Order blood work for iron  levels.

## 2024-05-31 NOTE — Assessment & Plan Note (Signed)
 She has radiation, chemotherapy and immunotherapy.  -Sessions ended March 2024 per patient.  -On supplemental oxygen  3L [place]

## 2024-05-31 NOTE — Assessment & Plan Note (Addendum)
  Continue using 3 L Oxygen  N/C - Monitor cough, report to pulmonologist if it persistent.

## 2024-05-31 NOTE — Progress Notes (Signed)
 Hemoglobin is stable, it has increased to 9.0, from 8.8 . Please continue to take your iron  supplements as prescribed by the oncologist.  Thank you!

## 2024-05-31 NOTE — Assessment & Plan Note (Signed)
 Urine culture needed for antibiotic sensitivity due to resistance concerns. - Send urine for culture. - Advise adequate hydration.

## 2024-06-03 DIAGNOSIS — C44719 Basal cell carcinoma of skin of left lower limb, including hip: Secondary | ICD-10-CM | POA: Diagnosis not present

## 2024-06-06 ENCOUNTER — Emergency Department (HOSPITAL_COMMUNITY)

## 2024-06-06 ENCOUNTER — Other Ambulatory Visit: Payer: Self-pay

## 2024-06-06 ENCOUNTER — Inpatient Hospital Stay (HOSPITAL_COMMUNITY)

## 2024-06-06 ENCOUNTER — Inpatient Hospital Stay (HOSPITAL_COMMUNITY)
Admission: EM | Admit: 2024-06-06 | Discharge: 2024-06-10 | DRG: 054 | Disposition: A | Discharge status: Home Health | Attending: Internal Medicine | Admitting: Internal Medicine

## 2024-06-06 DIAGNOSIS — Z85828 Personal history of other malignant neoplasm of skin: Secondary | ICD-10-CM | POA: Diagnosis not present

## 2024-06-06 DIAGNOSIS — I2510 Atherosclerotic heart disease of native coronary artery without angina pectoris: Secondary | ICD-10-CM | POA: Diagnosis not present

## 2024-06-06 DIAGNOSIS — Z87891 Personal history of nicotine dependence: Secondary | ICD-10-CM

## 2024-06-06 DIAGNOSIS — Z59868 Other specified financial insecurity: Secondary | ICD-10-CM

## 2024-06-06 DIAGNOSIS — N184 Chronic kidney disease, stage 4 (severe): Secondary | ICD-10-CM | POA: Diagnosis present

## 2024-06-06 DIAGNOSIS — G939 Disorder of brain, unspecified: Secondary | ICD-10-CM | POA: Diagnosis not present

## 2024-06-06 DIAGNOSIS — J90 Pleural effusion, not elsewhere classified: Secondary | ICD-10-CM | POA: Diagnosis not present

## 2024-06-06 DIAGNOSIS — Z7189 Other specified counseling: Secondary | ICD-10-CM | POA: Diagnosis not present

## 2024-06-06 DIAGNOSIS — C3490 Malignant neoplasm of unspecified part of unspecified bronchus or lung: Secondary | ICD-10-CM | POA: Diagnosis not present

## 2024-06-06 DIAGNOSIS — R231 Pallor: Secondary | ICD-10-CM | POA: Diagnosis not present

## 2024-06-06 DIAGNOSIS — R220 Localized swelling, mass and lump, head: Secondary | ICD-10-CM | POA: Diagnosis not present

## 2024-06-06 DIAGNOSIS — N1832 Chronic kidney disease, stage 3b: Secondary | ICD-10-CM | POA: Diagnosis not present

## 2024-06-06 DIAGNOSIS — N200 Calculus of kidney: Secondary | ICD-10-CM | POA: Diagnosis not present

## 2024-06-06 DIAGNOSIS — N390 Urinary tract infection, site not specified: Secondary | ICD-10-CM | POA: Diagnosis not present

## 2024-06-06 DIAGNOSIS — J449 Chronic obstructive pulmonary disease, unspecified: Secondary | ICD-10-CM | POA: Diagnosis present

## 2024-06-06 DIAGNOSIS — E875 Hyperkalemia: Secondary | ICD-10-CM | POA: Diagnosis present

## 2024-06-06 DIAGNOSIS — Z85118 Personal history of other malignant neoplasm of bronchus and lung: Secondary | ICD-10-CM | POA: Diagnosis not present

## 2024-06-06 DIAGNOSIS — Z7409 Other reduced mobility: Secondary | ICD-10-CM | POA: Diagnosis present

## 2024-06-06 DIAGNOSIS — G9389 Other specified disorders of brain: Secondary | ICD-10-CM | POA: Diagnosis not present

## 2024-06-06 DIAGNOSIS — I129 Hypertensive chronic kidney disease with stage 1 through stage 4 chronic kidney disease, or unspecified chronic kidney disease: Secondary | ICD-10-CM | POA: Diagnosis not present

## 2024-06-06 DIAGNOSIS — Z8249 Family history of ischemic heart disease and other diseases of the circulatory system: Secondary | ICD-10-CM | POA: Diagnosis not present

## 2024-06-06 DIAGNOSIS — G936 Cerebral edema: Secondary | ICD-10-CM | POA: Diagnosis not present

## 2024-06-06 DIAGNOSIS — K449 Diaphragmatic hernia without obstruction or gangrene: Secondary | ICD-10-CM | POA: Diagnosis not present

## 2024-06-06 DIAGNOSIS — Z9981 Dependence on supplemental oxygen: Secondary | ICD-10-CM

## 2024-06-06 DIAGNOSIS — Z79899 Other long term (current) drug therapy: Secondary | ICD-10-CM

## 2024-06-06 DIAGNOSIS — Z885 Allergy status to narcotic agent status: Secondary | ICD-10-CM

## 2024-06-06 DIAGNOSIS — H538 Other visual disturbances: Secondary | ICD-10-CM | POA: Diagnosis not present

## 2024-06-06 DIAGNOSIS — C7931 Secondary malignant neoplasm of brain: Secondary | ICD-10-CM | POA: Diagnosis not present

## 2024-06-06 DIAGNOSIS — N8110 Cystocele, unspecified: Secondary | ICD-10-CM | POA: Diagnosis not present

## 2024-06-06 DIAGNOSIS — N183 Chronic kidney disease, stage 3 unspecified: Secondary | ICD-10-CM | POA: Diagnosis present

## 2024-06-06 DIAGNOSIS — D631 Anemia in chronic kidney disease: Secondary | ICD-10-CM | POA: Diagnosis present

## 2024-06-06 DIAGNOSIS — E785 Hyperlipidemia, unspecified: Secondary | ICD-10-CM | POA: Diagnosis not present

## 2024-06-06 DIAGNOSIS — R900 Intracranial space-occupying lesion found on diagnostic imaging of central nervous system: Principal | ICD-10-CM

## 2024-06-06 DIAGNOSIS — N289 Disorder of kidney and ureter, unspecified: Secondary | ICD-10-CM | POA: Diagnosis not present

## 2024-06-06 DIAGNOSIS — K529 Noninfective gastroenteritis and colitis, unspecified: Secondary | ICD-10-CM | POA: Diagnosis present

## 2024-06-06 DIAGNOSIS — C3432 Malignant neoplasm of lower lobe, left bronchus or lung: Secondary | ICD-10-CM | POA: Diagnosis not present

## 2024-06-06 DIAGNOSIS — R112 Nausea with vomiting, unspecified: Secondary | ICD-10-CM | POA: Diagnosis not present

## 2024-06-06 DIAGNOSIS — J984 Other disorders of lung: Secondary | ICD-10-CM | POA: Diagnosis not present

## 2024-06-06 DIAGNOSIS — Z79890 Hormone replacement therapy (postmenopausal): Secondary | ICD-10-CM

## 2024-06-06 DIAGNOSIS — I6782 Cerebral ischemia: Secondary | ICD-10-CM | POA: Diagnosis not present

## 2024-06-06 DIAGNOSIS — Z7982 Long term (current) use of aspirin: Secondary | ICD-10-CM

## 2024-06-06 DIAGNOSIS — I10 Essential (primary) hypertension: Secondary | ICD-10-CM | POA: Diagnosis present

## 2024-06-06 DIAGNOSIS — R9082 White matter disease, unspecified: Secondary | ICD-10-CM | POA: Diagnosis not present

## 2024-06-06 DIAGNOSIS — R1110 Vomiting, unspecified: Secondary | ICD-10-CM | POA: Diagnosis not present

## 2024-06-06 DIAGNOSIS — J9611 Chronic respiratory failure with hypoxia: Secondary | ICD-10-CM | POA: Diagnosis present

## 2024-06-06 DIAGNOSIS — R531 Weakness: Secondary | ICD-10-CM | POA: Diagnosis not present

## 2024-06-06 DIAGNOSIS — Z923 Personal history of irradiation: Secondary | ICD-10-CM | POA: Diagnosis not present

## 2024-06-06 DIAGNOSIS — E039 Hypothyroidism, unspecified: Secondary | ICD-10-CM | POA: Diagnosis present

## 2024-06-06 DIAGNOSIS — Z87440 Personal history of urinary (tract) infections: Secondary | ICD-10-CM

## 2024-06-06 DIAGNOSIS — R079 Chest pain, unspecified: Secondary | ICD-10-CM | POA: Diagnosis not present

## 2024-06-06 DIAGNOSIS — I252 Old myocardial infarction: Secondary | ICD-10-CM

## 2024-06-06 DIAGNOSIS — R42 Dizziness and giddiness: Secondary | ICD-10-CM | POA: Diagnosis not present

## 2024-06-06 DIAGNOSIS — Z515 Encounter for palliative care: Secondary | ICD-10-CM

## 2024-06-06 LAB — CBC WITH DIFFERENTIAL/PLATELET
Abs Immature Granulocytes: 0.05 K/uL (ref 0.00–0.07)
Basophils Absolute: 0.1 K/uL (ref 0.0–0.1)
Basophils Relative: 0 %
Eosinophils Absolute: 0 K/uL (ref 0.0–0.5)
Eosinophils Relative: 0 %
HCT: 33.4 % — ABNORMAL LOW (ref 36.0–46.0)
Hemoglobin: 9.9 g/dL — ABNORMAL LOW (ref 12.0–15.0)
Immature Granulocytes: 0 %
Lymphocytes Relative: 2 %
Lymphs Abs: 0.2 K/uL — ABNORMAL LOW (ref 0.7–4.0)
MCH: 27.6 pg (ref 26.0–34.0)
MCHC: 29.6 g/dL — ABNORMAL LOW (ref 30.0–36.0)
MCV: 93 fL (ref 80.0–100.0)
Monocytes Absolute: 0.9 K/uL (ref 0.1–1.0)
Monocytes Relative: 6 %
Neutro Abs: 12.9 K/uL — ABNORMAL HIGH (ref 1.7–7.7)
Neutrophils Relative %: 92 %
Platelets: 233 K/uL (ref 150–400)
RBC: 3.59 MIL/uL — ABNORMAL LOW (ref 3.87–5.11)
RDW: 15.8 % — ABNORMAL HIGH (ref 11.5–15.5)
WBC: 14.1 K/uL — ABNORMAL HIGH (ref 4.0–10.5)
nRBC: 0 % (ref 0.0–0.2)

## 2024-06-06 LAB — URINALYSIS, ROUTINE W REFLEX MICROSCOPIC
Bilirubin Urine: NEGATIVE
Glucose, UA: NEGATIVE mg/dL
Ketones, ur: NEGATIVE mg/dL
Nitrite: POSITIVE — AB
Protein, ur: 30 mg/dL — AB
Specific Gravity, Urine: 1.012 (ref 1.005–1.030)
WBC, UA: >50 WBC/hpf (ref 0–5)
pH: 5 (ref 5.0–8.0)

## 2024-06-06 LAB — HEPATIC FUNCTION PANEL
ALT: 14 U/L (ref 0–44)
AST: 19 U/L (ref 15–41)
Albumin: 3.8 g/dL (ref 3.5–5.0)
Alkaline Phosphatase: 116 U/L (ref 38–126)
Bilirubin, Direct: <0.1 mg/dL (ref 0.0–0.2)
Total Bilirubin: 0.3 mg/dL (ref 0.0–1.2)
Total Protein: 7.8 g/dL (ref 6.5–8.1)

## 2024-06-06 LAB — BASIC METABOLIC PANEL WITH GFR
Anion gap: 8 (ref 5–15)
BUN: 38 mg/dL — ABNORMAL HIGH (ref 8–23)
CO2: 23 mmol/L (ref 22–32)
Calcium: 9 mg/dL (ref 8.9–10.3)
Chloride: 103 mmol/L (ref 98–111)
Creatinine, Ser: 1.54 mg/dL — ABNORMAL HIGH (ref 0.44–1.00)
GFR, Estimated: 33 mL/min — ABNORMAL LOW (ref >60)
Glucose, Bld: 150 mg/dL — ABNORMAL HIGH (ref 70–99)
Potassium: 5.1 mmol/L (ref 3.5–5.1)
Sodium: 134 mmol/L — ABNORMAL LOW (ref 135–145)

## 2024-06-06 LAB — I-STAT CHEM 8, ED
BUN: 39 mg/dL — ABNORMAL HIGH (ref 8–23)
Calcium, Ion: 1.12 mmol/L — ABNORMAL LOW (ref 1.15–1.40)
Chloride: 105 mmol/L (ref 98–111)
Creatinine, Ser: 1.5 mg/dL — ABNORMAL HIGH (ref 0.44–1.00)
Glucose, Bld: 152 mg/dL — ABNORMAL HIGH (ref 70–99)
HCT: 33 % — ABNORMAL LOW (ref 36.0–46.0)
Hemoglobin: 11.2 g/dL — ABNORMAL LOW (ref 12.0–15.0)
Potassium: 5.2 mmol/L — ABNORMAL HIGH (ref 3.5–5.1)
Sodium: 139 mmol/L (ref 135–145)
TCO2: 24 mmol/L (ref 22–32)

## 2024-06-06 LAB — LIPASE, BLOOD: Lipase: 29 U/L (ref 11–51)

## 2024-06-06 MED ORDER — SODIUM CHLORIDE 0.9% FLUSH: 3.0000 mL | INTRAVENOUS | Status: DC | PRN

## 2024-06-06 MED ORDER — ONDANSETRON HCL 4 MG/2ML IJ SOLN
4.0000 mg | Freq: Once | INTRAMUSCULAR | Status: AC
  Administered 2024-06-06: 4 mg via INTRAVENOUS
  Filled 2024-06-06: qty 2

## 2024-06-06 MED ORDER — ONDANSETRON HCL 4 MG PO TABS
4.0000 mg | ORAL_TABLET | Freq: Four times a day (QID) | ORAL | Status: DC | PRN
  Administered 2024-06-08 – 2024-06-10 (×2): 4 mg via ORAL
  Filled 2024-06-06 (×2): qty 1

## 2024-06-06 MED ORDER — LACTATED RINGERS IV BOLUS
500.0000 mL | Freq: Once | INTRAVENOUS | Status: AC
  Administered 2024-06-06: 500 mL via INTRAVENOUS

## 2024-06-06 MED ORDER — ONDANSETRON HCL 4 MG/2ML IJ SOLN
4.0000 mg | Freq: Four times a day (QID) | INTRAMUSCULAR | Status: DC | PRN
  Administered 2024-06-06: 4 mg via INTRAVENOUS
  Filled 2024-06-06: qty 2

## 2024-06-06 MED ORDER — HYDRALAZINE HCL 20 MG/ML IJ SOLN: 5.0000 mg | Freq: Four times a day (QID) | INTRAMUSCULAR | Status: DC | PRN

## 2024-06-06 MED ORDER — HYDROCODONE-ACETAMINOPHEN 5-325 MG PO TABS
1.0000 | ORAL_TABLET | ORAL | Status: DC | PRN
  Administered 2024-06-08: 1 via ORAL
  Administered 2024-06-10: 2 via ORAL
  Administered 2024-06-10: 1 via ORAL
  Filled 2024-06-06: qty 2
  Filled 2024-06-06 (×2): qty 1

## 2024-06-06 MED ORDER — SODIUM CHLORIDE 0.9 % IV BOLUS
500.0000 mL | Freq: Once | INTRAVENOUS | Status: AC
  Administered 2024-06-06: 500 mL via INTRAVENOUS

## 2024-06-06 MED ORDER — HEPARIN SODIUM (PORCINE) 5000 UNIT/ML IJ SOLN
5000.0000 [IU] | Freq: Three times a day (TID) | INTRAMUSCULAR | Status: DC
  Administered 2024-06-06 – 2024-06-07 (×4): 5000 [IU] via SUBCUTANEOUS
  Filled 2024-06-06 (×4): qty 1

## 2024-06-06 MED ORDER — METOPROLOL SUCCINATE ER 50 MG PO TB24
50.0000 mg | ORAL_TABLET | Freq: Every day | ORAL | Status: DC
  Administered 2024-06-06 – 2024-06-10 (×4): 50 mg via ORAL
  Filled 2024-06-06 (×5): qty 1

## 2024-06-06 MED ORDER — ACETAMINOPHEN 650 MG RE SUPP: 650.0000 mg | Freq: Four times a day (QID) | RECTAL | Status: DC | PRN

## 2024-06-06 MED ORDER — BUDESON-GLYCOPYRROL-FORMOTEROL 160-9-4.8 MCG/ACT IN AERO
2.0000 | INHALATION_SPRAY | Freq: Two times a day (BID) | RESPIRATORY_TRACT | Status: DC
  Administered 2024-06-06 – 2024-06-10 (×8): 2 via RESPIRATORY_TRACT
  Filled 2024-06-06: qty 5.9

## 2024-06-06 MED ORDER — DIAZEPAM 5 MG/ML IJ SOLN
2.5000 mg | Freq: Once | INTRAMUSCULAR | Status: AC
  Administered 2024-06-06: 2.5 mg via INTRAVENOUS
  Filled 2024-06-06: qty 2

## 2024-06-06 MED ORDER — SODIUM CHLORIDE 0.9 % IV SOLN: 250.0000 mL | INTRAVENOUS | Status: AC | PRN

## 2024-06-06 MED ORDER — SODIUM CHLORIDE 0.9% FLUSH
3.0000 mL | Freq: Two times a day (BID) | INTRAVENOUS | Status: DC
  Administered 2024-06-06 – 2024-06-10 (×8): 3 mL via INTRAVENOUS

## 2024-06-06 MED ORDER — GADOBUTROL 1 MMOL/ML IV SOLN
9.0000 mL | Freq: Once | INTRAVENOUS | Status: AC | PRN
  Administered 2024-06-06: 9 mL via INTRAVENOUS

## 2024-06-06 MED ORDER — ALBUTEROL SULFATE (2.5 MG/3ML) 0.083% IN NEBU
3.0000 mL | INHALATION_SOLUTION | Freq: Four times a day (QID) | RESPIRATORY_TRACT | Status: AC | PRN
Start: 2024-06-06 — End: ?

## 2024-06-06 MED ORDER — ACETAMINOPHEN 325 MG PO TABS: 650.0000 mg | ORAL_TABLET | Freq: Four times a day (QID) | ORAL | Status: DC | PRN

## 2024-06-06 MED ORDER — POLYETHYLENE GLYCOL 3350 17 G PO PACK: 17.0000 g | PACK | Freq: Every day | ORAL | Status: DC | PRN

## 2024-06-06 MED ORDER — LEVOTHYROXINE SODIUM 75 MCG PO TABS
175.0000 ug | ORAL_TABLET | Freq: Every day | ORAL | Status: DC
  Administered 2024-06-07 – 2024-06-10 (×4): 175 ug via ORAL
  Filled 2024-06-06 (×4): qty 1

## 2024-06-06 NOTE — ED Provider Notes (Signed)
 [place] EM[facility] DEPARTMENT AT [facility] Provider Note   CSN: [id number] Arrival date & time: 06/06/24  1130     Patient presents with: Weakness and Dizziness   Abigail [name] is a 88 y.o. female.   88 year old female presents for evaluation of nausea not feeling well.  States this started today.  States she has had some vomiting and headache lightheadedness and vision changes that started this morning after the nausea and vomiting.  She denies any chest pain or shortness of breath.  Recently had skin cancer removed from her left lower extremity.  States she changed the bandage today and has not noticed any redness or drainage.  Denies any fevers, dysuria, abdominal pain, chest pain or any other symptoms or concerns at this time.   Weakness Associated symptoms: dizziness, nausea and vomiting   Associated symptoms: no abdominal pain, no arthralgias, no chest pain, no cough, no dysuria, no fever, no seizures and no shortness of breath   Dizziness Associated symptoms: nausea, vomiting and weakness   Associated symptoms: no chest pain, no palpitations and no shortness of breath        Prior to Admission medications   Medication Sig Start Date End Date Taking? Authorizing Provider  albuterol  (VENTOLIN  HFA) 108 (90 Base) MCG/ACT inhaler TAKE 2 PUFFS BY MOUTH EVERY 6 HOURS AS NEEDED FOR WHEEZE OR SHORTNESS OF BREATH 12/20/21   Heilingoetter, Cassandra L, PA-C  amoxicillin  (AMOXIL ) 500 MG tablet Take 1 tablet (500 mg total) by mouth 2 (two) times daily. 05/21/24   [name], FNP  aspirin  EC 81 MG tablet Take 81 mg by mouth at bedtime.    [provider]  atorvastatin  (LIPITOR ) 80 MG tablet Take 80 mg by mouth daily. 04/15/24   [provider]  BREZTRI  AEROSPHERE 160-9-4.8 MCG/ACT AERO inhaler INHALE 2 PUFFS INTO THE LUNGS IN THE MORNING AND ALSO 2 PUFFS AT BEDTIME 12/08/23   Shelah Lamar RAMAN, MD  estradiol  (ESTRACE ) 0.1 MG/GM vaginal cream Place 0.5g twice a  week at opening of vagina 10/13/23   Marilynne Rosaline SAILOR, MD  Evolocumab  (REPATHA  SURECLICK) 140 MG/ML SOAJ Inject 140 mg into the skin every 14 (fourteen) days. 04/14/24   Patwardhan, Newman PARAS, MD  ferrous sulfate  325 (65 FE) MG EC tablet TAKE 1 TABLET BY MOUTH EVERY DAY WITH BREAKFAST 03/13/24   Heilingoetter, Cassandra L, PA-C  levothyroxine  (SYNTHROID ) 175 MCG tablet TAKE 1 TABLET BY MOUTH EVERY MORNING ON MONDAY - FRIDAY 04/14/24   Moore, [name], FNP  lisinopril  (ZESTRIL ) 10 MG tablet TAKE 1 TABLET BY MOUTH EVERY DAY 03/30/24   [name], FNP  metoprolol  succinate (TOPROL -XL) 50 MG 24 hr tablet Take 50 mg by mouth daily. 04/15/24   [provider]  nitrofurantoin  (MACRODANTIN ) 100 MG capsule Take 100 mg by mouth daily.    [provider]  nitroGLYCERIN  (NITROSTAT ) 0.4 MG SL tablet PLACE 1 TABLET UNDER THE TONGUE EVERY 5 (FIVE) MINUTES X 3 DOSES AS NEEDED FOR CHEST PAIN. 07/28/23   [name], FNP  OXYGEN  Inhale 3 L into the lungs continuous.    [provider]  trospium  (SANCTURA ) 20 MG tablet Take 1 tablet (20 mg total) by mouth 2 (two) times daily. 10/13/23 10/07/24  Marilynne Rosaline SAILOR, MD    Allergies: Codeine and Norvasc  [amlodipine ]    Review of Systems  Constitutional:  Negative for chills and fever.  HENT:  Negative for ear pain and sore throat.   Eyes:  Negative for pain and  visual disturbance.  Respiratory:  Negative for cough and shortness of breath.   Cardiovascular:  Negative for chest pain and palpitations.  Gastrointestinal:  Positive for nausea and vomiting. Negative for abdominal pain.  Genitourinary:  Negative for dysuria and hematuria.  Musculoskeletal:  Negative for arthralgias and back pain.  Skin:  Negative for color change and rash.  Neurological:  Positive for dizziness and weakness. Negative for seizures and syncope.  All other systems reviewed and are negative.   Updated Vital Signs BP 125/60   Pulse 71   Temp 97.8 F (36.6 C)  (Oral)   Resp 13   Ht 5' 6 (1.676 m)   Wt 71.7 kg   SpO2 100%   BMI 25.50 kg/m   Physical Exam Vitals and nursing note reviewed.  Constitutional:      General: She is not in acute distress.    Appearance: Normal appearance. She is well-developed. She is ill-appearing.  HENT:     Head: Normocephalic and atraumatic.  Eyes:     Conjunctiva/sclera: Conjunctivae normal.  Cardiovascular:     Rate and Rhythm: Normal rate and regular rhythm.     Heart sounds: Normal heart sounds. No murmur heard. Pulmonary:     Effort: Pulmonary effort is normal. No respiratory distress.     Breath sounds: Normal breath sounds. No wheezing.  Abdominal:     General: There is no distension.     Palpations: Abdomen is soft. There is no mass.     Tenderness: There is no abdominal tenderness.     Hernia: No hernia is present.  Musculoskeletal:        General: No swelling.     Cervical back: Neck supple.     Comments: Left lower extremity with recent skin cancer removal, wound is healing well with no signs of infection or drainage, no surrounding erythema or edema  Skin:    General: Skin is warm and dry.     Capillary Refill: Capillary refill takes less than 2 seconds.  Neurological:     Mental Status: She is alert.  Psychiatric:        Mood and Affect: Mood normal.     (all labs ordered are listed, but only abnormal results are displayed) Labs Reviewed  CBC WITH DIFFERENTIAL/PLATELET - Abnormal; Notable for the following components:      Result Value   WBC 14.1 (*)    RBC 3.59 (*)    Hemoglobin 9.9 (*)    HCT 33.4 (*)    MCHC 29.6 (*)    RDW 15.8 (*)    Neutro Abs 12.9 (*)    Lymphs Abs 0.2 (*)    All other components within normal limits  BASIC METABOLIC PANEL WITH GFR - Abnormal; Notable for the following components:   Sodium 134 (*)    Glucose, Bld 150 (*)    BUN 38 (*)    Creatinine, Ser 1.54 (*)    GFR, Estimated 33 (*)    All other components within normal limits  I-STAT CHEM  8, ED - Abnormal; Notable for the following components:   Potassium 5.2 (*)    BUN 39 (*)    Creatinine, Ser 1.50 (*)    Glucose, Bld 152 (*)    Calcium , Ion 1.12 (*)    Hemoglobin 11.2 (*)    HCT 33.0 (*)    All other components within normal limits  HEPATIC FUNCTION PANEL  LIPASE, BLOOD  CBC WITH DIFFERENTIAL/PLATELET  URINALYSIS, ROUTINE W REFLEX MICROSCOPIC  EKG: EKG Interpretation Date/Time:  Monday June 06 2024 12:04:29 EDT Ventricular Rate:  71 PR Interval:  160 QRS Duration:  154 QT Interval:  451 QTC Calculation: 491 R Axis:   38  Text Interpretation: Sinus rhythm Right bundle branch block Normal intervals Compared with prior EKG from 01/30/2024 Confirmed by Gennaro Bouchard ([id number]) on 06/06/2024 12:06:57 PM  Radiology: CT Head Wo Contrast Result Date: 06/06/2024 EXAM: CT HEAD WITHOUT CONTRAST 06/06/2024 01:09:39 PM TECHNIQUE: CT of the head was performed without the administration of intravenous contrast. Automated exposure control, iterative reconstruction, and/or weight based adjustment of the mA/kV was utilized to reduce the radiation dose to as low as reasonably achievable. COMPARISON: CT head 05/22/2023. CLINICAL HISTORY: ams, vision changes. FINDINGS: BRAIN AND VENTRICLES: No acute hemorrhage. No evidence of acute infarct. There is edema within the left cerebellum with associated mass effect and partial effacement of the 4th ventricle. Findings are most concerning for vasogenic edema in the setting of possible mass lesion. Nonspecific hypoattenuation in the periventricular and subcortical white matter, most likely representing chronic microvascular ischemic changes. Mild parenchymal volume loss. Atherosclerosis of the carotid siphons. No extra-axial collection. ORBITS: No acute abnormality. SINUSES: Chronic left sphenoid sinusitis. SOFT TISSUES AND SKULL: No acute soft tissue abnormality. No skull fracture. IMPRESSION: 1. Edema within the left cerebellum with  associated mass effect and partial effacement of the 4th ventricle, concerning for vasogenic edema in the setting of possible mass lesion. Recommend MRI brain with and without contrast for further evaluation. 2. Mild chronic microvascular ischemic changes and mild parenchymal volume loss. 3. Chronic left sphenoid sinusitis. Electronically signed by: Donnice Mania MD 06/06/2024 01:52 PM EDT RP Workstation: [id number]   CT ABDOMEN PELVIS WO CONTRAST Result Date: 06/06/2024 CLINICAL DATA:  Abdominal pain, nausea, and vomiting EXAM: CT ABDOMEN AND PELVIS WITHOUT CONTRAST TECHNIQUE: Multidetector CT imaging of the abdomen and pelvis was performed following the standard protocol without IV contrast. RADIATION DOSE REDUCTION: This exam was performed according to the departmental dose-optimization program which includes automated exposure control, adjustment of the mA and/or kV according to patient size and/or use of iterative reconstruction technique. COMPARISON:  CT abdomen and pelvis dated 01/30/2024 and multiple priors FINDINGS: Lower chest: No focal consolidation or pulmonary nodule in the lung bases. Partially imaged loculated left pleural effusion. Partially imaged heart size is normal. Hepatobiliary: No focal hepatic lesions. No intra or extrahepatic biliary ductal dilation. Normal gallbladder. Pancreas: No focal lesions or main ductal dilation. Spleen: Normal in size without focal abnormality. Adrenals/Urinary Tract: No adrenal nodules. Bilateral renal lesions, better evaluated on prior MRI dated 09/27/2022. No hydronephrosis. Nonobstructing left interpolar stone measures 10 mm. Focal mural thickening of the anterior inferior bladder (2:75) not substantially changed dating back to CT PET dated 07/09/2021, where it was not hypermetabolic. This finding may be secondary to inferior bladder herniation. Small cystocele. Stomach/Bowel: Large hiatal hernia. No evidence of bowel wall thickening, distention, or  inflammatory changes. Mild mural thickening of the transverse colon demonstrating mild pericolonic stranding in containing layering fluid levels. Appendix is not discretely seen. Vascular/Lymphatic: Aortic atherosclerosis. No enlarged abdominal or pelvic lymph nodes. Reproductive: No adnexal masses. Other: No free fluid, fluid collection, or free air. Pelvic floor laxity. Musculoskeletal: No acute or abnormal lytic or blastic osseous lesions. Multilevel degenerative changes of the partially imaged thoracic and lumbar spine. Small fat-containing paraumbilical hernia. IMPRESSION: 1. Mild mural thickening of the transverse colon demonstrating mild pericolonic stranding in containing layering fluid levels, suggestive of colitis. 2. Large hiatal hernia.  3. Partially imaged loculated left pleural effusion. 4. Nonobstructing left interpolar renal stone measures 10 mm. 5. Pelvic floor laxity with small cystocele. 6.  Aortic Atherosclerosis (ICD10-I70.0). Electronically Signed   By: Limin  Xu M.D.   On: 06/06/2024 13:43   DG Chest 1 View Result Date: 06/06/2024 CLINICAL DATA:  Chest pain, dizziness, nausea and weakness. Vomiting. EXAM: CHEST  1 VIEW COMPARISON:  01/28/2024 and CT chest 04/04/2024. FINDINGS: Trachea is midline. Heart is enlarged. Thoracic aorta is calcified. Pleuroparenchymal scarring in the left perihilar region and base of the left hemithorax, as on CT chest 04/04/2024. No superimposed airspace consolidation. No definite right pleural fluid. IMPRESSION: 1. No acute findings. 2. 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 Electronically Signed   By: Newell Eke M.D.   On: 06/06/2024 13:06     Procedures   Medications Ordered in the ED  metoprolol  succinate (TOPROL -XL) 24 hr tablet 50 mg (has no administration in time range)  levothyroxine  (SYNTHROID ) tablet 175 mcg (has no administration in time range)  budesonide -glycopyrrolate -formoterol  (BREZTRI ) 160-9-4.8 MCG/ACT  inhaler 2 puff (has no administration in time range)  albuterol  (PROVENTIL ) (2.5 MG/3ML) 0.083% nebulizer solution 3 mL (has no administration in time range)  sodium chloride  flush (NS) 0.9 % injection 3 mL (3 mLs Intravenous Not Given 06/06/24 1508)  sodium chloride  flush (NS) 0.9 % injection 3 mL (has no administration in time range)  0.9 %  sodium chloride  infusion (has no administration in time range)  acetaminophen  (TYLENOL ) tablet 650 mg (has no administration in time range)    Or  acetaminophen  (TYLENOL ) suppository 650 mg (has no administration in time range)  HYDROcodone -acetaminophen  (NORCO/VICODIN) 5-325 MG per tablet 1-2 tablet (has no administration in time range)  polyethylene glycol (MIRALAX  / GLYCOLAX ) packet 17 g (has no administration in time range)  ondansetron  (ZOFRAN ) tablet 4 mg (has no administration in time range)    Or  ondansetron  (ZOFRAN ) injection 4 mg (has no administration in time range)  hydrALAZINE  (APRESOLINE ) injection 5 mg (has no administration in time range)  heparin  injection 5,000 Units (has no administration in time range)  diazepam (VALIUM) injection 2.5 mg (has no administration in time range)  sodium chloride  0.9 % bolus 500 mL (0 mLs Intravenous Stopped 06/06/24 1507)  ondansetron  (ZOFRAN ) injection 4 mg (4 mg Intravenous Given 06/06/24 1507)  lactated ringers  bolus 500 mL (500 mLs Intravenous New Bag/Given 06/06/24 1507)    Clinical Course as of 06/06/24 1537  Mon Jun 06, 2024  1151 EKG not crossing over into MUSE, inter by me in the absence of cardiology shows sinus rhythm, right bundle branch block, normal intervals otherwise, no STEMI [MK]    Clinical Course User Index [MK] Gennaro Duwaine CROME, DO                                 Medical Decision Making Cardiac monitor interpretation: Sinus rhythm, no ectopy  Patient here for dizziness nausea and vomiting.  Negative lab workup, but she is given IV fluids and Zofran  and feeling somewhat  better.  Vitals are stable.  She has a new vasogenic edema/mass found on CT head.  MRI of the brain with and without contrast was ordered per radiology recommendations.  I spoke with Dr. Colon, neurosurgery and he will consult on the patient but recommended no acute intervention at this time.  Will follow-up the MRI.  Spoke with hospitalist, Dr. Sonjia, and  patient will be admitted for further workup and management to his service.  All results and plan were discussed with patient.  She feels comfortable to plan for admission.  Problems Addressed: Dizziness: acute illness or injury Intracranial mass: undiagnosed new problem with uncertain prognosis Nausea and vomiting, unspecified vomiting type: acute illness or injury  Amount and/or Complexity of Data Reviewed External Data Reviewed: notes.    Details: Previous outpatient records reviewed and patient does get seen frequently in the outpatient office for UTI most recent visit was a couple weeks ago Labs: ordered. Decision-making details documented in ED Course.    Details: Ordered and reviewed by me and patient with baseline creatinine, no other acute lab abnormality, UA pending Radiology: ordered and independent interpretation performed. Decision-making details documented in ED Course.    Details: Chest x-ray: Ordered and inter by me independently of radiology and shows no acute abnormality  CT abdomen pelvis: Ordered and reviewed and shows constipation but no acute abnormality CT head: Shows evidence of vasogenic edema with some midline shift and possible intracranial mass MRI head pending ECG/medicine tests: ordered and independent interpretation performed. Decision-making details documented in ED Course.    Details: Ordered and inter by me in the absence of cardiology and shows sinus rhythm, no STEMI  Risk OTC drugs. Prescription drug management. Drug therapy requiring intensive monitoring for toxicity. Decision regarding  hospitalization.     Final diagnoses:  Intracranial mass  Nausea and vomiting, unspecified vomiting type  Dizziness    ED Discharge Orders     None          Gennaro Duwaine CROME, DO 06/06/24 1537

## 2024-06-06 NOTE — ED Notes (Signed)
 Phleb messaged regarding lab draw as samples hemolyzed

## 2024-06-06 NOTE — ED Notes (Signed)
Phleb at bedside to obtain labs 

## 2024-06-06 NOTE — ED Notes (Signed)
 MRI called by RN. Patient to be taken upstairs from MRI. Belongings collected from room and taken to patient's stretcher in MRI.

## 2024-06-06 NOTE — Hospital Course (Addendum)
 Abigail [name]

## 2024-06-06 NOTE — Consult Note (Signed)
 Reason for Consult: New lesion of the left cerebellum Referring Physician: Dr. Gennaro Shuck Abigail [name] is an 88 y.o. female.  HPI: The patient is an 88 year old right-handed individual who has had some episodes of lightheadedness and near syncope at home where 911 was summoned and she was brought to the emergency department and a CT scan of the head demonstrates the presence of some edema in the left cerebellar hemisphere.  Patient has a history of some skin cancer being resected from the left ankle.  She notes that she has been having pain in the left ankle and because of that has been unable to sleep she felt exhausted and thought she might have missed her Meals on Wheels.  She was a bit confused about the date whether it was Sunday or Monday but she notes that she went to the door of her house and felt extremely lightheaded and then was able to sit down where she states that she called 911.  Past Medical History:  Diagnosis Date   Anemia    Arthritis    knees, back   Bacteremia 04/08/2012   CAP (community acquired pneumonia) 04/12/2012   Carotid artery occlusion    Chronic kidney disease    stage 3 ckd no nephrologist   Complete uterine prolapse with prolapse of anterior vaginal wall    Complication of anesthesia    hard to wake up per pt   Constipation    Coronary artery disease    Diverticulitis yrs ago coialitis   Dyspnea    History of blood transfusion    History of radiation therapy    right lung 08/05/2021-09/18/2021  Dr Lynwood Nasuti   Hypertension    Hypothyroid    LLQ abdominal pain 04/05/2012   Lung cancer (HCC)    Numbness    in hands at times   Pneumonia    Pre-diabetes    Scoliosis    STEMI (ST elevation myocardial infarction) (HCC) 10/26/2019   DES RCA   Wears dentures    full dentures   Wears glasses    for reading    Past Surgical History:  Procedure Laterality Date   ANTERIOR AND POSTERIOR REPAIR WITH SACROSPINOUS FIXATION N/A 12/20/2020   Procedure:  SACROSPINOUS LIGAMENT FIXATION;  Surgeon: Marilynne Rosaline SAILOR, MD;  Location: [facility];  Service: Gynecology;  Laterality: N/A;  Total time requested for all procedures is 2 hours   BACK SURGERY  1980 age 85   lower   bartholin cyst removal  age 84's   BLADDER SUSPENSION N/A 12/20/2020   Procedure: TRANSVAGINAL TAPE (TVT) PROCEDURE;  Surgeon: Marilynne Rosaline SAILOR, MD;  Location: [facility];  Service: Gynecology;  Laterality: N/A;   BRONCHIAL BRUSHINGS  07/15/2021   Procedure: BRONCHIAL BRUSHINGS;  Surgeon: Shelah Lamar RAMAN, MD;  Location: [facility] ENDOSCOPY;  Service: Pulmonary;;   BRONCHIAL NEEDLE ASPIRATION BIOPSY  07/15/2021   Procedure: BRONCHIAL NEEDLE ASPIRATION BIOPSIES;  Surgeon: Shelah Lamar RAMAN, MD;  Location: [facility] ENDOSCOPY;  Service: Pulmonary;;   CORONARY/GRAFT ACUTE MI REVASCULARIZATION N/A 10/26/2019   Procedure: Coronary/Graft Acute MI Revascularization;  Surgeon: Wonda Sharper, MD;  Location: [facility] INVASIVE CV LAB;  Service: Cardiovascular;  Laterality: N/A;   CYSTOCELE REPAIR N/A 12/20/2020   Procedure: ANTERIOR AND POSTERIOR REPAIR WITH PERINEORRHAPHY;  Surgeon: Marilynne Rosaline SAILOR, MD;  Location: [facility];  Service: Gynecology;  Laterality: N/A;   CYSTOSCOPY N/A 12/20/2020   Procedure: CYSTOSCOPY;  Surgeon: Marilynne Rosaline SAILOR, MD;  Location: [facility]  [place];  Service: Gynecology;  Laterality: N/A;   ELBOW SURGERY  1990's   left   ENDOBRONCHIAL ULTRASOUND N/A 07/15/2021   Procedure: ENDOBRONCHIAL ULTRASOUND;  Surgeon: Shelah Lamar RAMAN, MD;  Location: [facility] ENDOSCOPY;  Service: Pulmonary;  Laterality: N/A;   HEMOSTASIS CONTROL  07/15/2021   Procedure: HEMOSTASIS CONTROL;  Surgeon: Shelah Lamar RAMAN, MD;  Location: [facility] ENDOSCOPY;  Service: Pulmonary;;   LEFT HEART CATH AND CORONARY ANGIOGRAPHY N/A 10/26/2019   Procedure: LEFT HEART CATH AND CORONARY ANGIOGRAPHY;  Surgeon: Wonda Sharper, MD;  Location: [facility] INVASIVE CV LAB;   Service: Cardiovascular;  Laterality: N/A;   TONSILLECTOMY      Family History  Problem Relation Age of Onset   Hypertension Mother    Colon cancer Neg Hx    Stomach cancer Neg Hx    Esophageal cancer Neg Hx     Social History:  reports that she quit smoking about 38 years ago. Her smoking use included cigarettes. She started smoking about 73 years ago. She has a 52.5 pack-year smoking history. She has never used smokeless tobacco. She reports that she does not drink alcohol and does not use drugs.  Allergies:  Allergies  Allergen Reactions   Codeine Nausea And Vomiting   Norvasc  [Amlodipine ] Rash and Other (See Comments)    rash    Medications: I have reviewed the patient's current medications.  Results for orders placed or performed during the hospital encounter of 06/06/24 (from the past 48 hours)  I-stat chem 8, ED     Status: Abnormal   Collection Time: 06/06/24  2:28 PM  Result Value Ref Range   Sodium 139 135 - 145 mmol/L   Potassium 5.2 (H) 3.5 - 5.1 mmol/L   Chloride 105 98 - 111 mmol/L   BUN 39 (H) 8 - 23 mg/dL   Creatinine, Ser 8.49 (H) 0.44 - 1.00 mg/dL   Glucose, Bld 847 (H) 70 - 99 mg/dL    Comment: Glucose reference range applies only to samples taken after fasting for at least 8 hours.   Calcium , Ion 1.12 (L) 1.15 - 1.40 mmol/L   TCO2 24 22 - 32 mmol/L   Hemoglobin 11.2 (L) 12.0 - 15.0 g/dL   HCT 66.9 (L) 63.9 - 53.9 %  CBC with Differential/Platelet     Status: Abnormal   Collection Time: 06/06/24  2:32 PM  Result Value Ref Range   WBC 14.1 (H) 4.0 - 10.5 K/uL   RBC 3.59 (L) 3.87 - 5.11 MIL/uL   Hemoglobin 9.9 (L) 12.0 - 15.0 g/dL   HCT 66.5 (L) 63.9 - 53.9 %   MCV 93.0 80.0 - 100.0 fL   MCH 27.6 26.0 - 34.0 pg   MCHC 29.6 (L) 30.0 - 36.0 g/dL   RDW 84.1 (H) 88.4 - 84.4 %   Platelets 233 150 - 400 K/uL   nRBC 0.0 0.0 - 0.2 %   Neutrophils Relative % 92 %   Neutro Abs 12.9 (H) 1.7 - 7.7 K/uL   Lymphocytes Relative 2 %   Lymphs Abs 0.2 (L) 0.7 -  4.0 K/uL   Monocytes Relative 6 %   Monocytes Absolute 0.9 0.1 - 1.0 K/uL   Eosinophils Relative 0 %   Eosinophils Absolute 0.0 0.0 - 0.5 K/uL   Basophils Relative 0 %   Basophils Absolute 0.1 0.0 - 0.1 K/uL   Immature Granulocytes 0 %   Abs Immature Granulocytes 0.05 0.00 - 0.07 K/uL    Comment: Performed at [facility], [address], [place], [place] [address]  Basic metabolic panel with GFR     Status: Abnormal   Collection Time: 06/06/24  2:32 PM  Result Value Ref Range   Sodium 134 (L) 135 - 145 mmol/L   Potassium 5.1 3.5 - 5.1 mmol/L   Chloride 103 98 - 111 mmol/L   CO2 23 22 - 32 mmol/L   Glucose, Bld 150 (H) 70 - 99 mg/dL    Comment: Glucose reference range applies only to samples taken after fasting for at least 8 hours.   BUN 38 (H) 8 - 23 mg/dL   Creatinine, Ser 8.45 (H) 0.44 - 1.00 mg/dL   Calcium  9.0 8.9 - 10.3 mg/dL   GFR, Estimated 33 (L) >60 mL/min    Comment: (NOTE) Calculated using the CKD-EPI Creatinine Equation (2021)    Anion gap 8 5 - 15    Comment: Performed at [facility], [address] [place], [place] [address]  Hepatic function panel     Status: None   Collection Time: 06/06/24  2:32 PM  Result Value Ref Range   Total Protein 7.8 6.5 - 8.1 g/dL   Albumin 3.8 3.5 - 5.0 g/dL   AST 19 15 - 41 U/L   ALT 14 0 - 44 U/L   Alkaline Phosphatase 116 38 - 126 U/L   Total Bilirubin 0.3 0.0 - 1.2 mg/dL   Bilirubin, Direct <9.8 0.0 - 0.2 mg/dL   Indirect Bilirubin NOT CALCULATED 0.3 - 0.9 mg/dL    Comment: Performed at [facility], [address] [place], [place] [address]  Lipase, blood     Status: None   Collection Time: 06/06/24  2:32 PM  Result Value Ref Range   Lipase 29 11 - 51 U/L    Comment: Performed at [facility], [address], [place], [place] [address]    CT Head Wo Contrast Result Date: 06/06/2024 EXAM: CT HEAD WITHOUT CONTRAST 06/06/2024 01:09:39 PM TECHNIQUE: CT of the head was performed without the  administration of intravenous contrast. Automated exposure control, iterative reconstruction, and/or weight based adjustment of the mA/kV was utilized to reduce the radiation dose to as low as reasonably achievable. COMPARISON: CT head 05/22/2023. CLINICAL HISTORY: ams, vision changes. FINDINGS: BRAIN AND VENTRICLES: No acute hemorrhage. No evidence of acute infarct. There is edema within the left cerebellum with associated mass effect and partial effacement of the 4th ventricle. Findings are most concerning for vasogenic edema in the setting of possible mass lesion. Nonspecific hypoattenuation in the periventricular and subcortical white matter, most likely representing chronic microvascular ischemic changes. Mild parenchymal volume loss. Atherosclerosis of the carotid siphons. No extra-axial collection. ORBITS: No acute abnormality. SINUSES: Chronic left sphenoid sinusitis. SOFT TISSUES AND SKULL: No acute soft tissue abnormality. No skull fracture. IMPRESSION: 1. Edema within the left cerebellum with associated mass effect and partial effacement of the 4th ventricle, concerning for vasogenic edema in the setting of possible mass lesion. Recommend MRI brain with and without contrast for further evaluation. 2. Mild chronic microvascular ischemic changes and mild parenchymal volume loss. 3. Chronic left sphenoid sinusitis. Electronically signed by: Donnice Mania MD 06/06/2024 01:52 PM EDT RP Workstation: [id number]   CT ABDOMEN PELVIS WO CONTRAST Result Date: 06/06/2024 CLINICAL DATA:  Abdominal pain, nausea, and vomiting EXAM: CT ABDOMEN AND PELVIS WITHOUT CONTRAST TECHNIQUE: Multidetector CT imaging of the abdomen and pelvis was performed following the standard protocol without IV contrast. RADIATION DOSE REDUCTION:  This exam was performed according to the departmental dose-optimization program which includes automated exposure control, adjustment of the mA and/or kV according to patient size and/or use of  iterative reconstruction technique. COMPARISON:  CT abdomen and pelvis dated 01/30/2024 and multiple priors FINDINGS: Lower chest: No focal consolidation or pulmonary nodule in the lung bases. Partially imaged loculated left pleural effusion. Partially imaged heart size is normal. Hepatobiliary: No focal hepatic lesions. No intra or extrahepatic biliary ductal dilation. Normal gallbladder. Pancreas: No focal lesions or main ductal dilation. Spleen: Normal in size without focal abnormality. Adrenals/Urinary Tract: No adrenal nodules. Bilateral renal lesions, better evaluated on prior MRI dated 09/27/2022. No hydronephrosis. Nonobstructing left interpolar stone measures 10 mm. Focal mural thickening of the anterior inferior bladder (2:75) not substantially changed dating back to CT PET dated 07/09/2021, where it was not hypermetabolic. This finding may be secondary to inferior bladder herniation. Small cystocele. Stomach/Bowel: Large hiatal hernia. No evidence of bowel wall thickening, distention, or inflammatory changes. Mild mural thickening of the transverse colon demonstrating mild pericolonic stranding in containing layering fluid levels. Appendix is not discretely seen. Vascular/Lymphatic: Aortic atherosclerosis. No enlarged abdominal or pelvic lymph nodes. Reproductive: No adnexal masses. Other: No free fluid, fluid collection, or free air. Pelvic floor laxity. Musculoskeletal: No acute or abnormal lytic or blastic osseous lesions. Multilevel degenerative changes of the partially imaged thoracic and lumbar spine. Small fat-containing paraumbilical hernia. IMPRESSION: 1. Mild mural thickening of the transverse colon demonstrating mild pericolonic stranding in containing layering fluid levels, suggestive of colitis. 2. Large hiatal hernia. 3. Partially imaged loculated left pleural effusion. 4. Nonobstructing left interpolar renal stone measures 10 mm. 5. Pelvic floor laxity with small cystocele. 6.  Aortic  Atherosclerosis (ICD10-I70.0). Electronically Signed   By: Limin  Xu M.D.   On: 06/06/2024 13:43   DG Chest 1 View Result Date: 06/06/2024 CLINICAL DATA:  Chest pain, dizziness, nausea and weakness. Vomiting. EXAM: CHEST  1 VIEW COMPARISON:  01/28/2024 and CT chest 04/04/2024. FINDINGS: Trachea is midline. Heart is enlarged. Thoracic aorta is calcified. Pleuroparenchymal scarring in the left perihilar region and base of the left hemithorax, as on CT chest 04/04/2024. No superimposed airspace consolidation. No definite right pleural fluid. IMPRESSION: 1. No acute findings. 2. 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 Electronically Signed   By: Newell Eke M.D.   On: 06/06/2024 13:06    Review of Systems  Musculoskeletal: Negative.   Neurological: Negative.   Hematological: Negative.   Psychiatric/Behavioral: Negative.    All other systems reviewed and are negative.  Blood pressure 125/60, pulse 71, temperature 98 F (36.7 C), temperature source Oral, resp. rate 13, height 5' 6 (1.676 m), weight 71.7 kg, SpO2 100%. Physical Exam Constitutional:      Appearance: Normal appearance.  HENT:     Head: Normocephalic and atraumatic.     Right Ear: Tympanic membrane, ear canal and external ear normal.     Left Ear: Tympanic membrane, ear canal and external ear normal.     Nose: Nose normal.     Mouth/Throat:     Mouth: Mucous membranes are moist.     Pharynx: Oropharynx is clear.  Eyes:     Extraocular Movements: Extraocular movements intact.     Conjunctiva/sclera: Conjunctivae normal.     Pupils: Pupils are equal, round, and reactive to light.  Cardiovascular:     Rate and Rhythm: Normal rate and regular rhythm.     Pulses: Normal pulses.     Heart  sounds: Normal heart sounds.  Pulmonary:     Effort: Pulmonary effort is normal.     Breath sounds: Normal breath sounds.  Abdominal:     General: Abdomen is flat.     Palpations: Abdomen is soft.   Musculoskeletal:        General: Normal range of motion.     Cervical back: Normal range of motion and neck supple.  Skin:    General: Skin is warm and dry.     Capillary Refill: Capillary refill takes less than 2 seconds.  Neurological:     Mental Status: She is alert and oriented to person, place, and time.     Comments: Patient showed some mild confusion as to the day and date otherwise oriented to the presence of the hospital.  She is also oriented to the reason that she was there.  Cranial nerves reveal that her pupils are 3 mm briskly reactive light and accommodation extraocular movements are full face is symmetric grimace tongue and uvula are in the midline sclera and conjunctiva are clear.  There is no evidence of a cortical drift and motor strength appears intact reflexes are symmetrically depressed in the biceps and triceps Babinski's downgoing lower extremity strength is symmetric to confrontational testing.  The patient was not ambulated.  Psychiatric:        Mood and Affect: Mood normal.        Behavior: Behavior normal.        Judgment: Judgment normal.     Assessment/Plan: 88 year old individual with new finding of left cerebellar lesion.  Further discussion can be had once the MRI is completed.  This likely represents a metastatic lesion CT of the chest abdomen pelvis has been ordered.  Victory PARAS Bobbye Petti 06/06/2024, 4:21 PM

## 2024-06-06 NOTE — ED Notes (Signed)
 Awaiting meds to be verified

## 2024-06-06 NOTE — ED Notes (Signed)
 Pt unable to give urine sample due to contamination with feces. Pt having frequent BMs.

## 2024-06-06 NOTE — ED Notes (Signed)
 2W called to notify that patient will be brought up by transport after MRI is over

## 2024-06-06 NOTE — ED Notes (Signed)
 Patient transported to MRI

## 2024-06-06 NOTE — ED Notes (Signed)
 Patient remains in MRI

## 2024-06-06 NOTE — ED Triage Notes (Signed)
 Patient arrives via [facility] for weakness, dizziness, nausea, and vomiting x1.5 hr ago. Left lower leg surgery to remove cancer, bandage changed yesterday. 1000mg  tylenol  given prior to EMS arrival. Patient is from home. Patient on 3L at baseline. Patient alert and oriented x4. Given 4mg  zofran  en route with little relief. 18 L forearm.   EMS vitals BP 112/58 HR 88 97 on 3 liters CBG 188 Temp 97.4

## 2024-06-06 NOTE — H&P (Addendum)
 [facility] History and Physical  Abigail [name] FMW:[id number] DOB: 1936-08-08 DOA: 06/06/2024  Referring physician: ED  PCP: [name], FNP   Patient is coming from: Home  Chief Complaint: Nausea vomiting  HPI:  Patient is is a 88 years old female with past medical history of anemia, chronic kidney disease, chronic respiratory failure on 3 L of oxygen  at baseline, hypothyroidism, history of lung cancer, presented to hospital from home with weakness, dizziness nausea and vomiting for 1 to 2 hours prior to presentation.  Patient also had near syncopal episode with dizziness and lightheadedness today.  Had some blurriness of eyes. Of note, patient had left lower leg surgery to remove cancer and her dressings were changed yesterday.  Patient has history of COPD and is on 3 L of oxygen  at baseline.  Denies increasing shortness of breath, dyspnea, chest pain palpitation fever chills, rigor or increasing cough.  At baseline, able to walk on a walker.  Son lives with her at home.  Has history of lung cancer status post immunotherapy, radiation treatment and some rounds of chemo.  No trouble urinating.  Denies abdominal pain or diarrhea.  In the ED, vitals were stable.  Labs were notable for mild hyperkalemia with potassium of 5.2 with creatinine elevation at 1.5.  Hemoglobin was 11.2.  CBC was notable for leukocytosis with WBC at 14.1 hemoglobin 9.9.  X-ray of the chest showed no acute findings.  CT head scan showed edema within the left cerebellum with associated mass effect and partial effacement of the fourth ventricle concerning for vasogenic edema in the setting of possible mass lesion.  CT scan of the abdomen and pelvis showed mild mural thickening of the transverse colon suggestive of colitis. EMS was called in and received Tylenol .  In the ED, patient received Ringer lactate 500 mL, Zofran  IV x 1, neurosurgery was consulted and patient was considered for admission to the hospital for  further evaluation and treatment.   Assessment and Plan Principal Problem:   Nausea & vomiting Active Problems:   Hyperlipidemia   Hypothyroidism   CKD (chronic kidney disease), stage III (HCC)   Essential hypertension   Malignant neoplasm of unspecified part of unspecified bronchus or lung (HCC)   Brain mass   Dizziness, nausea, vomiting likely secondary to brain lesion CT head scan concerning for left cerebellar mass with associated mass effect and vasogenic edema.  No obvious hydronephrosis reported.  Continue symptomatic care including antiemetics , hydration.  Neurosurgery, Dr Colon was consulted and plan is to follow MRI of the brain and did not recommend IV steroids.  MRI of the brain and CT scan of the chest has been ordered.  LFTs pending.  CT abdomen nonspecific finding.  History of chronic kidney disease, creatinine of 1.5.  Will continue to monitor closely.  Check BMP in AM.  Encourage oral hydration. Baseline creatinine between 1.2-1.4  Leukocytosis with neutrophilia.  Unlikely to be infection related.  Urinalysis pending.  No fever.  Check CBC in AM.  History of lung cancer.   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  Continue inhalers supplemental oxygen .  Chest x-ray with posttreatment pleural-parenchymal scarring.  CT of the chest has been ordered to assess for lung cancer  Chronic hypoxic respiratory failure on 3 L of oxygen .  Will continue while in the hospital.  Continue inhalers from home.  Appears to be compensated at this time.  No respiratory distress.  Hypothyroidism. Continue Synthroid   Anemia of chronic disease  stage IIIb.  Will continue to monitor closely.   Check CBC in a.m.  Essential hypertension on metoprolol  and lisinopril  at home..  Will continue with the metoprolol  for now.  Add as needed hydralazine .  Hold off with lisinopril   Borderline hyperkalemia. check BMP in AM.  Hold lisinopril  for now  DVT Prophylaxis: Heparin   subcu  Review of Systems:  All systems were reviewed and were negative unless otherwise mentioned in the HPI   Past Medical History:  Diagnosis Date   Anemia    Arthritis    knees, back   Bacteremia 04/08/2012   CAP (community acquired pneumonia) 04/12/2012   Carotid artery occlusion    Chronic kidney disease    stage 3 ckd no nephrologist   Complete uterine prolapse with prolapse of anterior vaginal wall    Complication of anesthesia    hard to wake up per pt   Constipation    Coronary artery disease    Diverticulitis yrs ago coialitis   Dyspnea    History of blood transfusion    History of radiation therapy    right lung 08/05/2021-09/18/2021  Dr Lynwood Nasuti   Hypertension    Hypothyroid    LLQ abdominal pain 04/05/2012   Lung cancer (HCC)    Numbness    in hands at times   Pneumonia    Pre-diabetes    Scoliosis    STEMI (ST elevation myocardial infarction) (HCC) 10/26/2019   DES RCA   Wears dentures    full dentures   Wears glasses    for reading   Past Surgical History:  Procedure Laterality Date   ANTERIOR AND POSTERIOR REPAIR WITH SACROSPINOUS FIXATION N/A 12/20/2020   Procedure: SACROSPINOUS LIGAMENT FIXATION;  Surgeon: Marilynne Rosaline SAILOR, MD;  Location: [facility];  Service: Gynecology;  Laterality: N/A;  Total time requested for all procedures is 2 hours   BACK SURGERY  1980 age 44   lower   bartholin cyst removal  age 35's   BLADDER SUSPENSION N/A 12/20/2020   Procedure: TRANSVAGINAL TAPE (TVT) PROCEDURE;  Surgeon: Marilynne Rosaline SAILOR, MD;  Location: [facility];  Service: Gynecology;  Laterality: N/A;   BRONCHIAL BRUSHINGS  07/15/2021   Procedure: BRONCHIAL BRUSHINGS;  Surgeon: Shelah Lamar RAMAN, MD;  Location: [facility] ENDOSCOPY;  Service: Pulmonary;;   BRONCHIAL NEEDLE ASPIRATION BIOPSY  07/15/2021   Procedure: BRONCHIAL NEEDLE ASPIRATION BIOPSIES;  Surgeon: Shelah Lamar RAMAN, MD;  Location: [facility] ENDOSCOPY;  Service:  Pulmonary;;   CORONARY/GRAFT ACUTE MI REVASCULARIZATION N/A 10/26/2019   Procedure: Coronary/Graft Acute MI Revascularization;  Surgeon: Wonda Sharper, MD;  Location: [facility] INVASIVE CV LAB;  Service: Cardiovascular;  Laterality: N/A;   CYSTOCELE REPAIR N/A 12/20/2020   Procedure: ANTERIOR AND POSTERIOR REPAIR WITH PERINEORRHAPHY;  Surgeon: Marilynne Rosaline SAILOR, MD;  Location: [facility];  Service: Gynecology;  Laterality: N/A;   CYSTOSCOPY N/A 12/20/2020   Procedure: CYSTOSCOPY;  Surgeon: Marilynne Rosaline SAILOR, MD;  Location: [facility];  Service: Gynecology;  Laterality: N/A;   ELBOW SURGERY  1990's   left   ENDOBRONCHIAL ULTRASOUND N/A 07/15/2021   Procedure: ENDOBRONCHIAL ULTRASOUND;  Surgeon: Shelah Lamar RAMAN, MD;  Location: [facility] ENDOSCOPY;  Service: Pulmonary;  Laterality: N/A;   HEMOSTASIS CONTROL  07/15/2021   Procedure: HEMOSTASIS CONTROL;  Surgeon: Shelah Lamar RAMAN, MD;  Location: [facility] ENDOSCOPY;  Service: Pulmonary;;   LEFT HEART CATH AND CORONARY ANGIOGRAPHY N/A 10/26/2019   Procedure: LEFT HEART CATH AND CORONARY ANGIOGRAPHY;  Surgeon: Wonda,  Ozell, MD;  Location: [facility] INVASIVE CV LAB;  Service: Cardiovascular;  Laterality: N/A;   TONSILLECTOMY      Social History:  reports that she quit smoking about 38 years ago. Her smoking use included cigarettes. She started smoking about 73 years ago. She has a 52.5 pack-year smoking history. She has never used smokeless tobacco. She reports that she does not drink alcohol and does not use drugs.  Allergies  Allergen Reactions   Codeine Nausea And Vomiting   Norvasc  [Amlodipine ] Rash and Other (See Comments)    rash    Family History  Problem Relation Age of Onset   Hypertension Mother    Colon cancer Neg Hx    Stomach cancer Neg Hx    Esophageal cancer Neg Hx      Prior to Admission medications   Medication Sig Start Date End Date Taking? Authorizing Provider  albuterol  (VENTOLIN  HFA) 108 (90 Base)  MCG/ACT inhaler TAKE 2 PUFFS BY MOUTH EVERY 6 HOURS AS NEEDED FOR WHEEZE OR SHORTNESS OF BREATH 12/20/21   Heilingoetter, Cassandra L, PA-C  amoxicillin  (AMOXIL ) 500 MG tablet Take 1 tablet (500 mg total) by mouth 2 (two) times daily. 05/21/24   [name], FNP  aspirin  EC 81 MG tablet Take 81 mg by mouth at bedtime.    [provider]  atorvastatin  (LIPITOR ) 80 MG tablet Take 80 mg by mouth daily. 04/15/24   [provider]  BREZTRI  AEROSPHERE 160-9-4.8 MCG/ACT AERO inhaler INHALE 2 PUFFS INTO THE LUNGS IN THE MORNING AND ALSO 2 PUFFS AT BEDTIME 12/08/23   Shelah Lamar RAMAN, MD  estradiol  (ESTRACE ) 0.1 MG/GM vaginal cream Place 0.5g twice a week at opening of vagina 10/13/23   Marilynne Rosaline SAILOR, MD  Evolocumab  (REPATHA  SURECLICK) 140 MG/ML SOAJ Inject 140 mg into the skin every 14 (fourteen) days. 04/14/24   Patwardhan, Newman PARAS, MD  ferrous sulfate  325 (65 FE) MG EC tablet TAKE 1 TABLET BY MOUTH EVERY DAY WITH BREAKFAST 03/13/24   Heilingoetter, Cassandra L, PA-C  levothyroxine  (SYNTHROID ) 175 MCG tablet TAKE 1 TABLET BY MOUTH EVERY MORNING ON MONDAY - FRIDAY 04/14/24   Moore, [name], FNP  lisinopril  (ZESTRIL ) 10 MG tablet TAKE 1 TABLET BY MOUTH EVERY DAY 03/30/24   [name], FNP  metoprolol  succinate (TOPROL -XL) 50 MG 24 hr tablet Take 50 mg by mouth daily. 04/15/24   [provider]  nitrofurantoin  (MACRODANTIN ) 100 MG capsule Take 100 mg by mouth daily.    [provider]  nitroGLYCERIN  (NITROSTAT ) 0.4 MG SL tablet PLACE 1 TABLET UNDER THE TONGUE EVERY 5 (FIVE) MINUTES X 3 DOSES AS NEEDED FOR CHEST PAIN. 07/28/23   [name], FNP  OXYGEN  Inhale 3 L into the lungs continuous.    [provider]  trospium  (SANCTURA ) 20 MG tablet Take 1 tablet (20 mg total) by mouth 2 (two) times daily. 10/13/23 10/07/24  Marilynne Rosaline SAILOR, MD    Physical Exam:  Vitals:   06/06/24 1136 06/06/24 1144 06/06/24 1145 06/06/24 1200  BP:   (!) 128/56 125/60  Pulse:   70  71  Resp:  (!) 21  13  Temp:   97.8 F (36.6 C)   TempSrc:   Oral   SpO2:  100%  100%  Weight: 71.7 kg     Height: 5' 6 (1.676 m)      Wt Readings from Last 3 Encounters:  06/06/24 71.7 kg  05/19/24 72.6 kg  05/12/24 71.7 kg   Body mass index is  25.5 kg/m.  General:  Average built, not in obvious distress, elderly female, on supplemental oxygen , Communicative, oriented HENT: Normocephalic, No scleral pallor or icterus noted. Oral mucosa is moist.  Chest: Diminished breath sounds bilaterally, no obvious wheezes or crackles. CVS: S1 &S2 heard. No murmur.  Regular rate and rhythm. Abdomen: Soft, nontender, nondistended.  Bowel sounds are heard. No abdominal mass palpated Extremities: No cyanosis, clubbing or edema.  Peripheral pulses are palpable. Psych: Alert, awake and oriented, mildly anxious CNS:  No cranial nerve deficits.  Moves all extremities.  Gait could not be tested Skin: Warm and dry.  No rashes noted.  Labs on Admission:   CBC: Recent Labs  Lab 06/06/24 1428 06/06/24 1432  WBC  --  14.1*  NEUTROABS  --  12.9*  HGB 11.2* 9.9*  HCT 33.0* 33.4*  MCV  --  93.0  PLT  --  233    Basic Metabolic Panel: Recent Labs  Lab 06/06/24 1428  NA 139  K 5.2*  CL 105  GLUCOSE 152*  BUN 39*  CREATININE 1.50*    Liver Function Tests: No results for input(s): AST, ALT, ALKPHOS, BILITOT, PROT, ALBUMIN in the last 168 hours. No results for input(s): LIPASE, AMYLASE in the last 168 hours. No results for input(s): AMMONIA in the last 168 hours.  Cardiac Enzymes: No results for input(s): CKTOTAL, CKMB, CKMBINDEX, TROPONINI in the last 168 hours.  BNP (last 3 results) Recent Labs    10/11/23 0655  BNP 261.1*    ProBNP (last 3 results) No results for input(s): PROBNP in the last 8760 hours.  CBG: No results for input(s): GLUCAP in the last 168 hours.  Lipase     Component Value Date/Time   LIPASE 22 12/01/2019 0430      Urinalysis    Component Value Date/Time   COLORURINE YELLOW 01/28/2024 2008   APPEARANCEUR HAZY (A) 01/28/2024 2008   LABSPEC 1.011 01/28/2024 2008   PHURINE 6.0 01/28/2024 2008   GLUCOSEU NEGATIVE 01/28/2024 2008   HGBUR NEGATIVE 01/28/2024 2008   BILIRUBINUR negative 05/19/2024 1427   BILIRUBINUR Negative 04/07/2023 1226   KETONESUR negative 05/19/2024 1427   KETONESUR NEGATIVE 01/28/2024 2008   PROTEINUR 30 (A) 01/28/2024 2008   UROBILINOGEN 0.2 05/19/2024 1427   UROBILINOGEN 0.2 04/05/2012 1606   NITRITE Negative 05/19/2024 1427   NITRITE NEGATIVE 01/28/2024 2008   LEUKOCYTESUR Large (3+) (A) 05/19/2024 1427   LEUKOCYTESUR LARGE (A) 01/28/2024 2008     Drugs of Abuse  No results found for: LABOPIA, COCAINSCRNUR, LABBENZ, AMPHETMU, THCU, LABBARB    Radiological Exams on Admission: CT Head Wo Contrast Result Date: 06/06/2024 EXAM: CT HEAD WITHOUT CONTRAST 06/06/2024 01:09:39 PM TECHNIQUE: CT of the head was performed without the administration of intravenous contrast. Automated exposure control, iterative reconstruction, and/or weight based adjustment of the mA/kV was utilized to reduce the radiation dose to as low as reasonably achievable. COMPARISON: CT head 05/22/2023. CLINICAL HISTORY: ams, vision changes. FINDINGS: BRAIN AND VENTRICLES: No acute hemorrhage. No evidence of acute infarct. There is edema within the left cerebellum with associated mass effect and partial effacement of the 4th ventricle. Findings are most concerning for vasogenic edema in the setting of possible mass lesion. Nonspecific hypoattenuation in the periventricular and subcortical white matter, most likely representing chronic microvascular ischemic changes. Mild parenchymal volume loss. Atherosclerosis of the carotid siphons. No extra-axial collection. ORBITS: No acute abnormality. SINUSES: Chronic left sphenoid sinusitis. SOFT TISSUES AND SKULL: No acute soft tissue abnormality. No skull  fracture. IMPRESSION: 1. Edema within the left cerebellum with associated mass effect and partial effacement of the 4th ventricle, concerning for vasogenic edema in the setting of possible mass lesion. Recommend MRI brain with and without contrast for further evaluation. 2. Mild chronic microvascular ischemic changes and mild parenchymal volume loss. 3. Chronic left sphenoid sinusitis. Electronically signed by: Donnice Mania MD 06/06/2024 01:52 PM EDT RP Workstation: [id number]   CT ABDOMEN PELVIS WO CONTRAST Result Date: 06/06/2024 CLINICAL DATA:  Abdominal pain, nausea, and vomiting EXAM: CT ABDOMEN AND PELVIS WITHOUT CONTRAST TECHNIQUE: Multidetector CT imaging of the abdomen and pelvis was performed following the standard protocol without IV contrast. RADIATION DOSE REDUCTION: This exam was performed according to the departmental dose-optimization program which includes automated exposure control, adjustment of the mA and/or kV according to patient size and/or use of iterative reconstruction technique. COMPARISON:  CT abdomen and pelvis dated 01/30/2024 and multiple priors FINDINGS: Lower chest: No focal consolidation or pulmonary nodule in the lung bases. Partially imaged loculated left pleural effusion. Partially imaged heart size is normal. Hepatobiliary: No focal hepatic lesions. No intra or extrahepatic biliary ductal dilation. Normal gallbladder. Pancreas: No focal lesions or main ductal dilation. Spleen: Normal in size without focal abnormality. Adrenals/Urinary Tract: No adrenal nodules. Bilateral renal lesions, better evaluated on prior MRI dated 09/27/2022. No hydronephrosis. Nonobstructing left interpolar stone measures 10 mm. Focal mural thickening of the anterior inferior bladder (2:75) not substantially changed dating back to CT PET dated 07/09/2021, where it was not hypermetabolic. This finding may be secondary to inferior bladder herniation. Small cystocele. Stomach/Bowel: Large hiatal hernia.  No evidence of bowel wall thickening, distention, or inflammatory changes. Mild mural thickening of the transverse colon demonstrating mild pericolonic stranding in containing layering fluid levels. Appendix is not discretely seen. Vascular/Lymphatic: Aortic atherosclerosis. No enlarged abdominal or pelvic lymph nodes. Reproductive: No adnexal masses. Other: No free fluid, fluid collection, or free air. Pelvic floor laxity. Musculoskeletal: No acute or abnormal lytic or blastic osseous lesions. Multilevel degenerative changes of the partially imaged thoracic and lumbar spine. Small fat-containing paraumbilical hernia. IMPRESSION: 1. Mild mural thickening of the transverse colon demonstrating mild pericolonic stranding in containing layering fluid levels, suggestive of colitis. 2. Large hiatal hernia. 3. Partially imaged loculated left pleural effusion. 4. Nonobstructing left interpolar renal stone measures 10 mm. 5. Pelvic floor laxity with small cystocele. 6.  Aortic Atherosclerosis (ICD10-I70.0). Electronically Signed   By: Limin  Xu M.D.   On: 06/06/2024 13:43   DG Chest 1 View Result Date: 06/06/2024 CLINICAL DATA:  Chest pain, dizziness, nausea and weakness. Vomiting. EXAM: CHEST  1 VIEW COMPARISON:  01/28/2024 and CT chest 04/04/2024. FINDINGS: Trachea is midline. Heart is enlarged. Thoracic aorta is calcified. Pleuroparenchymal scarring in the left perihilar region and base of the left hemithorax, as on CT chest 04/04/2024. No superimposed airspace consolidation. No definite right pleural fluid. IMPRESSION: 1. No acute findings. 2. 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 Electronically Signed   By: Newell Eke M.D.   On: 06/06/2024 13:06    EKG: Personally reviewed by me which shows normal sinus rhythm   Consultant: Neurosurgery Dr. Colon  Code Status: Full code.  I had a prolonged discussion with the patient and she is still not quite sure about the whole  situation.  Microbiology none  Antibiotics: None  Family Communication:  Patients' condition and plan of care including tests being ordered have been discussed with the patient and the patient's son  who indicate understanding  and agree with the plan.   Severity of Illness: The appropriate patient status for this patient is INPATIENT. Inpatient status is judged to be reasonable and necessary in order to provide the required intensity of service to ensure the patient's safety. The patient's presenting symptoms, physical exam findings, and initial radiographic and laboratory data in the context of their chronic comorbidities is felt to place them at high risk for further clinical deterioration. Furthermore, it is not anticipated that the patient will be medically stable for discharge from the hospital within 2 midnights of admission.   * I certify that at the point of admission it is my clinical judgment that the patient will require inpatient hospital care spanning beyond 2 midnights from the point of admission due to high intensity of service, high risk for further deterioration and high frequency of surveillance required.*  Signed, Vernal Alstrom, MD [facility] 06/06/2024

## 2024-06-07 ENCOUNTER — Ambulatory Visit
Admit: 2024-06-07 | Discharge: 2024-06-07 | Disposition: A | Discharge status: Home / Self Care | Attending: Radiation Oncology | Admitting: Radiation Oncology

## 2024-06-07 ENCOUNTER — Inpatient Hospital Stay (HOSPITAL_COMMUNITY)

## 2024-06-07 ENCOUNTER — Other Ambulatory Visit: Payer: Self-pay | Admitting: Radiation Therapy

## 2024-06-07 ENCOUNTER — Encounter (HOSPITAL_COMMUNITY): Payer: Self-pay | Admitting: Internal Medicine

## 2024-06-07 DIAGNOSIS — N1832 Chronic kidney disease, stage 3b: Secondary | ICD-10-CM | POA: Diagnosis not present

## 2024-06-07 DIAGNOSIS — Z7189 Other specified counseling: Secondary | ICD-10-CM

## 2024-06-07 DIAGNOSIS — R531 Weakness: Secondary | ICD-10-CM | POA: Diagnosis not present

## 2024-06-07 DIAGNOSIS — C3490 Malignant neoplasm of unspecified part of unspecified bronchus or lung: Secondary | ICD-10-CM

## 2024-06-07 DIAGNOSIS — C7931 Secondary malignant neoplasm of brain: Secondary | ICD-10-CM

## 2024-06-07 DIAGNOSIS — Z515 Encounter for palliative care: Secondary | ICD-10-CM | POA: Diagnosis not present

## 2024-06-07 DIAGNOSIS — G9389 Other specified disorders of brain: Secondary | ICD-10-CM

## 2024-06-07 DIAGNOSIS — R112 Nausea with vomiting, unspecified: Secondary | ICD-10-CM | POA: Diagnosis not present

## 2024-06-07 LAB — CBC
HCT: 28.3 % — ABNORMAL LOW (ref 36.0–46.0)
Hemoglobin: 8.6 g/dL — ABNORMAL LOW (ref 12.0–15.0)
MCH: 27.7 pg (ref 26.0–34.0)
MCHC: 30.4 g/dL (ref 30.0–36.0)
MCV: 91 fL (ref 80.0–100.0)
Platelets: 196 K/uL (ref 150–400)
RBC: 3.11 MIL/uL — ABNORMAL LOW (ref 3.87–5.11)
RDW: 15.9 % — ABNORMAL HIGH (ref 11.5–15.5)
WBC: 13.4 K/uL — ABNORMAL HIGH (ref 4.0–10.5)
nRBC: 0 % (ref 0.0–0.2)

## 2024-06-07 LAB — MAGNESIUM: Magnesium: 1.9 mg/dL (ref 1.7–2.4)

## 2024-06-07 LAB — COMPREHENSIVE METABOLIC PANEL WITH GFR
ALT: 12 U/L (ref 0–44)
AST: 16 U/L (ref 15–41)
Albumin: 3.1 g/dL — ABNORMAL LOW (ref 3.5–5.0)
Alkaline Phosphatase: 100 U/L (ref 38–126)
Anion gap: 10 (ref 5–15)
BUN: 34 mg/dL — ABNORMAL HIGH (ref 8–23)
CO2: 23 mmol/L (ref 22–32)
Calcium: 8.7 mg/dL — ABNORMAL LOW (ref 8.9–10.3)
Chloride: 101 mmol/L (ref 98–111)
Creatinine, Ser: 1.47 mg/dL — ABNORMAL HIGH (ref 0.44–1.00)
GFR, Estimated: 35 mL/min — ABNORMAL LOW (ref >60)
Glucose, Bld: 172 mg/dL — ABNORMAL HIGH (ref 70–99)
Potassium: 4.4 mmol/L (ref 3.5–5.1)
Sodium: 134 mmol/L — ABNORMAL LOW (ref 135–145)
Total Bilirubin: 0.6 mg/dL (ref 0.0–1.2)
Total Protein: 6.5 g/dL (ref 6.5–8.1)

## 2024-06-07 MED ORDER — PROCHLORPERAZINE EDISYLATE 10 MG/2ML IJ SOLN
10.0000 mg | Freq: Four times a day (QID) | INTRAMUSCULAR | Status: DC | PRN
  Administered 2024-06-07: 10 mg via INTRAVENOUS
  Filled 2024-06-07: qty 2

## 2024-06-07 MED ORDER — SODIUM CHLORIDE 0.9 % IV SOLN
2.0000 g | Freq: Every day | INTRAVENOUS | Status: DC
  Administered 2024-06-07 – 2024-06-10 (×4): 2 g via INTRAVENOUS
  Filled 2024-06-07 (×4): qty 12.5

## 2024-06-07 NOTE — Evaluation (Signed)
 Physical Therapy Evaluation Patient Details Name: Abigail [name] MRN: [id number] DOB: Feb 14, 1936 Today's Date: 06/07/2024  History of Present Illness  Pt is a 88 y.o. female who presented 10/13 due to nausea and vomiting. Pt also noted to reported having a near syncope episode and blurriness of eyes. Per chart CT of the head showed edema within the left cerebellum with associated mass effect and partial effacement of the fourth ventricle concerning for vasogenic edema in the setting of possible mass lesion.  CT scan showed suggestive of colitis. MRI showed enhancing mass in the lateral aspect of the left cerebellum concerning for metastatic disease. PMH: anemia, CKD,  chronic respiratory failure on 3 L of oxygen  at baseline, hypothyroidism, history of lung cancer, recent skin removal of L ankle for skin cancer   Clinical Impression  Pt presents with condition above and deficits mentioned below, see PT Problem List. PTA, she was mod I utilizing a rollator for functional mobility, living with her son in a 1-level house with 1 STE. Currently, she displays deficits in balance, endurance, gross strength, L leg coordination, and L lower leg skin integrity (surgery for skin cancer removal a week ago). She also displays questionable cognitive deficits in memory, processing, problem-solving, and attention. She is currently able to perform all functional mobility while utilizing a RW at a CGA-supervision level. She is at risk for falls and needs reminders to pick up her feet and stay proximal to her RW when ambulating. As the pt appears to  be functioning not too far from her baseline and is able to mobilize without physical assistance, recommend pt return home with HHPT follow-up at d/c. Will continue to follow acutely.      If plan is discharge home, recommend the following: A little help with bathing/dressing/bathroom;Assistance with cooking/housework;Assist for transportation;Help with stairs or ramp for  entrance;Supervision due to cognitive status   Can travel by private vehicle        Equipment Recommendations BSC/3in1  Recommendations for Other Services       Functional Status Assessment Patient has had a recent decline in their functional status and demonstrates the ability to make significant improvements in function in a reasonable and predictable amount of time.     Precautions / Restrictions Precautions Precautions: Fall Recall of Precautions/Restrictions: Impaired Precaution/Restrictions Comments: pt is fearful of falling, on 3L O2 baseline Restrictions Weight Bearing Restrictions Per Provider Order: No      Mobility  Bed Mobility Overal bed mobility: Needs Assistance Bed Mobility: Sit to Supine       Sit to supine: Supervision   General bed mobility comments: Increased time, supervision for safety    Transfers Overall transfer level: Needs assistance Equipment used: Rolling walker (2 wheels) Transfers: Sit to/from Stand Sit to Stand: Contact guard assist           General transfer comment: No LOB, CGA for safety, x2 reps    Ambulation/Gait Ambulation/Gait assistance: Contact guard assist Gait Distance (Feet): 110 Feet (x2 bouts of ~110 ft > ~5 ft) Assistive device: Rolling walker (2 wheels) Gait Pattern/deviations: Step-through pattern, Decreased step length - right, Decreased step length - left, Decreased stride length, Decreased dorsiflexion - right, Decreased dorsiflexion - left, Shuffle, Trunk flexed Gait velocity: reduced Gait velocity interpretation: <1.31 ft/sec, indicative of household ambulator   General Gait Details: Pt needed repeated cues to pick up her feet and stay proximal within her RW. No LOB noted, but instability noted, CGA for safety  Stairs  Wheelchair Mobility     Tilt Bed    Modified Rankin (Stroke Patients Only) Modified Rankin (Stroke Patients Only) Pre-Morbid Rankin Score: Slight  disability Modified Rankin: Slight disability     Balance Overall balance assessment: Needs assistance Sitting-balance support: Feet supported Sitting balance-Leahy Scale: Good     Standing balance support: Bilateral upper extremity supported, Single extremity supported, No upper extremity supported Standing balance-Leahy Scale: Fair Standing balance comment: very kyphotic in posture and needs cues on hand placement for UE support, able to stand statically without UE support but benefits from RW to ambulate                             Pertinent Vitals/Pain Pain Assessment Pain Assessment: Faces Faces Pain Scale: Hurts little more Pain Location: LLE from skin sx Pain Descriptors / Indicators: Discomfort, Grimacing, Guarding, Moaning, Sore Pain Intervention(s): Limited activity within patient's tolerance, Monitored during session, Repositioned    Home Living Family/patient expects to be discharged to:: Private residence Living Arrangements: Children (son but works during the day) Available Help at Discharge: Family;Available PRN/intermittently Type of Home: House Home Access: Stairs to enter Entrance Stairs-Rails: None Entrance Stairs-Number of Steps: 1   Home Layout: One level Home Equipment: Shower seat;Grab bars - tub/shower;Hand held shower head;Toilet riser;Rollator (4 wheels);Cane - quad Additional Comments: on 3L O2 at all times    Prior Function Prior Level of Function : Independent/Modified Independent             Mobility Comments: walks with rollator, on 3L O2 24/7 but then reports taking it off to go to the bathroom?, denies falls in past 6 months ADLs Comments: doesn't drive; independent with showering using shower seat     Extremity/Trunk Assessment   Upper Extremity Assessment Upper Extremity Assessment: Defer to OT evaluation    Lower Extremity Assessment Lower Extremity Assessment: Generalized weakness;LLE deficits/detail LLE Deficits  / Details: incoordination noted with rapid alternating movements; denied numbness/tingling bil; bill legs overall symmetrically weak except L knee extension weak against resistance due to L leg pain from wound from surgery last week; inferior lower leg bandage from skin surgery last week    Cervical / Trunk Assessment Cervical / Trunk Assessment: Kyphotic  Communication   Communication Communication: No apparent difficulties    Cognition Arousal: Alert Behavior During Therapy: Flat affect   PT - Cognitive impairments: No family/caregiver present to determine baseline                       PT - Cognition Comments: No family present to confirm baseline. Pt seemed distracted at times and needed education or cues repeated several times. Unsure if baseline or not Following commands: Impaired Following commands impaired: Follows multi-step commands with increased time     Cueing Cueing Techniques: Verbal cues     General Comments General comments (skin integrity, edema, etc.): on 3L O2 throughout    Exercises     Assessment/Plan    PT Assessment Patient needs continued PT services  PT Problem List Decreased strength;Decreased activity tolerance;Decreased balance;Decreased mobility;Decreased skin integrity;Decreased coordination;Decreased cognition;Cardiopulmonary status limiting activity       PT Treatment Interventions DME instruction;Gait training;Stair training;Functional mobility training;Therapeutic activities;Therapeutic exercise;Neuromuscular re-education;Balance training;Cognitive remediation;Patient/family education    PT Goals (Current goals can be found in the Care Plan section)  Acute Rehab PT Goals Patient Stated Goal: to feel better PT Goal Formulation: With patient Time For Goal Achievement:  06/21/24 Potential to Achieve Goals: Good    Frequency Min 2X/week     Co-evaluation               AM-PAC PT 6 Clicks Mobility  Outcome Measure Help  needed turning from your back to your side while in a flat bed without using bedrails?: A Little Help needed moving from lying on your back to sitting on the side of a flat bed without using bedrails?: A Little Help needed moving to and from a bed to a chair (including a wheelchair)?: A Little Help needed standing up from a chair using your arms (e.g., wheelchair or bedside chair)?: A Little Help needed to walk in hospital room?: A Little Help needed climbing 3-5 steps with a railing? : A Lot 6 Click Score: 17    End of Session Equipment Utilized During Treatment: Oxygen  Activity Tolerance: Patient tolerated treatment well Patient left: in bed;with call bell/phone within reach;with bed alarm set Nurse Communication: Mobility status PT Visit Diagnosis: Unsteadiness on feet (R26.81);Other abnormalities of gait and mobility (R26.89);Muscle weakness (generalized) (M62.81);Difficulty in walking, not elsewhere classified (R26.2)    Time: 1010-1039 PT Time Calculation (min) (ACUTE ONLY): 29 min   Charges:   PT Evaluation $PT Eval Moderate Complexity: 1 Mod PT Treatments $Gait Training: 8-22 mins PT General Charges $$ ACUTE PT VISIT: 1 Visit         Theo Ferretti, PT, DPT Acute Rehabilitation Services  Office: [phone]   Theo CHRISTELLA Ferretti 06/07/2024, 12:10 PM

## 2024-06-07 NOTE — Progress Notes (Signed)
 Dressing to LEFT lower leg changed. Wound with thick purulent drainage noted. Erythema noted to surrounding skin. Wound cleansed with saline. Vaseline gauze applied, covered with dry gauze and foam dressing placed. WOCN consult in place to assess wound.

## 2024-06-07 NOTE — Progress Notes (Signed)
 Radiation Oncology         [phone] ________________________________  Initial inpatient Consultation  Name: Abigail [name] MRN: [id number]  Date: 06/07/2024  DOB: [birth year]  Abigail [name]  Abigail Shove, MD   REFERRING PHYSICIAN: Colon Shove, MD  DIAGNOSIS:    ICD-10-CM   1. Malignant neoplasm of unspecified part of unspecified bronchus or lung (HCC)  C34.90        Cancer Staging  Malignant neoplasm of unspecified part of unspecified bronchus or lung (HCC) Staging form: Lung, AJCC 8th Edition - Clinical: Stage IIIA (cT2a, cN2, cM0) - Signed by Abigail Sherrod, MD on 07/25/2021   CHIEF COMPLAINT: Here to discuss management of brain metastasis from Stage IIIA (T2a, N2, M0) non-small cell lung cancer  HISTORY OF PRESENT ILLNESS::Abigail [name] is a 88 y.o. female who presented with a past medical history significant for CKD, COPD on 3L of oxygen , and a history of lung cancer seen at the request of Dr. Colon for a new left cerebellar lesion.   She was treated for stage IIIA (T2a, N2, M0) non-small cell lung cancer with chemoradiation and completed her final cycle in January of 2023. She proceeded with 13 cycles of consolidative immunotherapy and had remained under observation under the care of Dr. Sherrod. She last saw him on 04/11/2024. At that time, her restaging scan (CT of the chest on 04/04/2024) showed no evidence of disease progression and he had recommended a repeat CT scan in 6 months.   Most recently, patient presented to the ED on 07/07/2024 for a near syncopal episode consisting of dizziness and lightheadedness. CT head scan showed edema within the left cerebellum with associated mass effect and partial effacement of the fourth ventricle concerning for vasogenic edema in the setting of possible mass lesion. CT scan of the abdomen and pelvis showed mild mural thickening of the transverse Abigail suggestive of colitis. In the ED, patient received Ringer lactate 500 mL,  Zofran  IV x 1, neurosurgery was consulted and patient was considered for admission to the hospital for further evaluation and treatment.   MRI of the brain on 07/07/2024 demonstrated a 2.3 cm enhancing mass with surrounding vasogenic edema and mass effect on the fourth ventricle.   Patient reports to be doing well overall. She notes some nausea and blurred vision that started yesterday morning, but this has since gone away along with the dizziness and weakness. She is able to walk on her own. She denies any headaches, or changes in her memory or mood.    PREVIOUS RADIATION THERAPY: Yes   Radiation Treatment Dates: 08/05/2021 through 09/18/2021 Prescribing MD: Abigail [name] Site Technique Total Dose (Gy) Dose per Fx (Gy) Completed Fx Beam Energies  Lung, Left: Lung_L 3D 60/60 2 30/30 6X, 10X     PAST MEDICAL HISTORY:  has a past medical history of Anemia, Arthritis, Bacteremia (04/08/2012), CAP (community acquired pneumonia) (04/12/2012), Carotid artery occlusion, Chronic kidney disease, Complete uterine prolapse with prolapse of anterior vaginal wall, Complication of anesthesia, Constipation, Coronary artery disease, Diverticulitis (yrs ago coialitis), Dyspnea, History of blood transfusion, History of radiation therapy, Hypertension, Hypothyroid, LLQ abdominal pain (04/05/2012), Lung cancer (HCC), Numbness, Pneumonia, Pre-diabetes, Scoliosis, STEMI (ST elevation myocardial infarction) (HCC) (10/26/2019), Wears dentures, and Wears glasses.    PAST SURGICAL HISTORY: Past Surgical History:  Procedure Laterality Date   ANTERIOR AND POSTERIOR REPAIR WITH SACROSPINOUS FIXATION N/A 12/20/2020   Procedure: SACROSPINOUS LIGAMENT FIXATION;  Surgeon: Abigail Rosaline SAILOR, MD;  Location: [facility];  Service: Gynecology;  Laterality: N/A;  Total time requested for all procedures is 2 hours   BACK SURGERY  1980 age 34   lower   bartholin cyst removal  age 44's   BLADDER SUSPENSION N/A  12/20/2020   Procedure: TRANSVAGINAL TAPE (TVT) PROCEDURE;  Surgeon: Abigail Rosaline SAILOR, MD;  Location: [facility];  Service: Gynecology;  Laterality: N/A;   BRONCHIAL BRUSHINGS  07/15/2021   Procedure: BRONCHIAL BRUSHINGS;  Surgeon: Abigail Lamar RAMAN, MD;  Location: [facility] ENDOSCOPY;  Service: Pulmonary;;   BRONCHIAL NEEDLE ASPIRATION BIOPSY  07/15/2021   Procedure: BRONCHIAL NEEDLE ASPIRATION BIOPSIES;  Surgeon: Abigail Lamar RAMAN, MD;  Location: [facility] ENDOSCOPY;  Service: Pulmonary;;   CORONARY/GRAFT ACUTE MI REVASCULARIZATION N/A 10/26/2019   Procedure: Coronary/Graft Acute MI Revascularization;  Surgeon: Abigail Sharper, MD;  Location: [facility] INVASIVE CV LAB;  Service: Cardiovascular;  Laterality: N/A;   CYSTOCELE REPAIR N/A 12/20/2020   Procedure: ANTERIOR AND POSTERIOR REPAIR WITH PERINEORRHAPHY;  Surgeon: Abigail Rosaline SAILOR, MD;  Location: [facility];  Service: Gynecology;  Laterality: N/A;   CYSTOSCOPY N/A 12/20/2020   Procedure: CYSTOSCOPY;  Surgeon: Abigail Rosaline SAILOR, MD;  Location: [facility];  Service: Gynecology;  Laterality: N/A;   ELBOW SURGERY  1990's   left   ENDOBRONCHIAL ULTRASOUND N/A 07/15/2021   Procedure: ENDOBRONCHIAL ULTRASOUND;  Surgeon: Abigail Lamar RAMAN, MD;  Location: [facility] ENDOSCOPY;  Service: Pulmonary;  Laterality: N/A;   HEMOSTASIS CONTROL  07/15/2021   Procedure: HEMOSTASIS CONTROL;  Surgeon: Abigail Lamar RAMAN, MD;  Location: [facility] ENDOSCOPY;  Service: Pulmonary;;   LEFT HEART CATH AND CORONARY ANGIOGRAPHY N/A 10/26/2019   Procedure: LEFT HEART CATH AND CORONARY ANGIOGRAPHY;  Surgeon: Abigail Sharper, MD;  Location: [facility] INVASIVE CV LAB;  Service: Cardiovascular;  Laterality: N/A;   TONSILLECTOMY      FAMILY HISTORY: family history includes Hypertension in her mother.  SOCIAL HISTORY:  reports that she quit smoking about 38 years ago. Her smoking use included cigarettes. She started smoking about 73 years ago. She has a 52.5  pack-year smoking history. She has never used smokeless tobacco. She reports that she does not drink alcohol and does not use drugs.  ALLERGIES: Codeine and Norvasc  [amlodipine ]  MEDICATIONS:  No current facility-administered medications for this encounter.   No current outpatient medications on file.   Facility-Administered Medications Ordered in Other Encounters  Medication Dose Route Frequency Provider Last Rate Last Admin   acetaminophen  (TYLENOL ) tablet 650 mg  650 mg Oral Q6H PRN Pokhrel, Laxman, MD       Or   acetaminophen  (TYLENOL ) suppository 650 mg  650 mg Rectal Q6H PRN Pokhrel, Laxman, MD       albuterol  (PROVENTIL ) (2.5 MG/3ML) 0.083% nebulizer solution 3 mL  3 mL Inhalation Q6H PRN Pokhrel, Laxman, MD       budesonide -glycopyrrolate -formoterol  (BREZTRI ) 160-9-4.8 MCG/ACT inhaler 2 puff  2 puff Inhalation BID Pokhrel, Laxman, MD   2 puff at 06/07/24 1052   ceFEPIme (MAXIPIME) 2 g in sodium chloride  0.9 % 100 mL IVPB  2 g Intravenous Daily Chen, Lydia D, RPH 200 mL/hr at 06/07/24 1432 2 g at 06/07/24 1432   heparin  injection 5,000 Units  5,000 Units Subcutaneous Q8H Pokhrel, Laxman, MD   5,000 Units at 06/07/24 1434   hydrALAZINE  (APRESOLINE ) injection 5 mg  5 mg Intravenous Q6H PRN Pokhrel, Laxman, MD       HYDROcodone -acetaminophen  (NORCO/VICODIN) 5-325 MG per tablet 1-2 tablet  1-2 tablet Oral Q4H PRN  Pokhrel, Laxman, MD       levothyroxine  (SYNTHROID ) tablet 175 mcg  175 mcg Oral Q0600 Pokhrel, Laxman, MD   175 mcg at 06/07/24 9472   metoprolol  succinate (TOPROL -XL) 24 hr tablet 50 mg  50 mg Oral Daily Pokhrel, Laxman, MD   50 mg at 06/07/24 1052   ondansetron  (ZOFRAN ) tablet 4 mg  4 mg Oral Q6H PRN Pokhrel, Laxman, MD       Or   ondansetron  (ZOFRAN ) injection 4 mg  4 mg Intravenous Q6H PRN Pokhrel, Laxman, MD   4 mg at 06/06/24 2146   polyethylene glycol (MIRALAX  / GLYCOLAX ) packet 17 g  17 g Oral Daily PRN Pokhrel, Laxman, MD       prochlorperazine  (COMPAZINE ) injection 10  mg  10 mg Intravenous Q6H PRN Keturah Carrier, MD   10 mg at 06/07/24 0255   sodium chloride  flush (NS) 0.9 % injection 3 mL  3 mL Intravenous Q12H Pokhrel, Laxman, MD   3 mL at 06/07/24 1052   sodium chloride  flush (NS) 0.9 % injection 3 mL  3 mL Intravenous PRN Pokhrel, Laxman, MD        REVIEW OF SYSTEMS:  Notable for that above.   PHYSICAL EXAM:  vitals were not taken for this visit.   General: Alert and oriented, in no acute distress. She is lying comfortably in bed.  HEENT: Head is normocephalic. Extraocular movements are intact.  Heart: Regular in rate and rhythm with no murmurs, rubs, or gallops. Chest: Clear to auscultation bilaterally Abdomen: Soft, nontender, nondistended, with no rigidity or guarding. Extremities: No cyanosis or edema. Lymphatics: see Neck Exam Skin: No concerning lesions. Musculoskeletal: symmetric strength and muscle tone throughout. 5/5 lower and upper strength. Grip strength, dorsiflexion, and plantar flexion intact.  Neurologic: Cranial nerves II through XII are grossly intact. No obvious focalities. Speech is fluent. Coordination is intact. Psychiatric: Judgment and insight are intact. Affect is appropriate.   ECOG = 1  0 - Asymptomatic (Fully active, able to carry on all predisease activities without restriction)  1 - Symptomatic but completely ambulatory (Restricted in physically strenuous activity but ambulatory and able to carry out work of a light or sedentary nature. For example, light housework, office work)  2 - Symptomatic, <50% in bed during the day (Ambulatory and capable of all self care but unable to carry out any work activities. Up and about more than 50% of waking hours)  3 - Symptomatic, >50% in bed, but not bedbound (Capable of only limited self-care, confined to bed or chair 50% or more of waking hours)  4 - Bedbound (Completely disabled. Cannot carry on any self-care. Totally confined to bed or chair)  5 - Death   Raylene MM,  Creech RH, Tormey DC, et al. [phone]). Toxicity and response criteria of the [facility]. Am. DOROTHA Bridges. Oncol. 5 (6): 649-55   LABORATORY DATA:  Lab Results  Component Value Date   WBC 13.4 (H) 06/07/2024   HGB 8.6 (L) 06/07/2024   HCT 28.3 (L) 06/07/2024   MCV 91.0 06/07/2024   PLT 196 06/07/2024   CMP     Component Value Date/Time   NA 134 (L) 06/07/2024 0340   NA 137 02/17/2024 1150   K 4.4 06/07/2024 0340   CL 101 06/07/2024 0340   CO2 23 06/07/2024 0340   GLUCOSE 172 (H) 06/07/2024 0340   BUN 34 (H) 06/07/2024 0340   BUN 38 (H) 02/17/2024 1150   CREATININE 1.47 (H) 06/07/2024  0340   CREATININE 1.37 (H) 04/04/2024 1435   CREATININE 1.08 04/18/2014 1516   CALCIUM  8.7 (L) 06/07/2024 0340   PROT 6.5 06/07/2024 0340   PROT 7.2 08/24/2023 1158   ALBUMIN 3.1 (L) 06/07/2024 0340   ALBUMIN 4.2 08/24/2023 1158   AST 16 06/07/2024 0340   AST 12 (L) 04/04/2024 1435   ALT 12 06/07/2024 0340   ALT 10 04/04/2024 1435   ALKPHOS 100 06/07/2024 0340   BILITOT 0.6 06/07/2024 0340   BILITOT 0.2 04/04/2024 1435   EGFR 28 (L) 02/17/2024 1150   GFRNONAA 35 (L) 06/07/2024 0340   GFRNONAA 38 (L) 04/04/2024 1435         RADIOGRAPHY: MR Brain W and Wo Contrast Result Date: 06/06/2024 EXAM: MRI BRAIN WITH AND WITHOUT CONTRAST 06/06/2024 05:27:13 PM TECHNIQUE: Multiplanar multisequence MRI of the head/brain was performed with and without the administration of 9 mL gadobutrol  (GADAVIST ) 1 MMOL/ML injection. COMPARISON: Same day CT head. CLINICAL HISTORY: edema, mass effect. edema, mass effect edema, mass effect. edema, mass effect FINDINGS: BRAIN AND VENTRICLES: No acute infarct. No acute intracranial hemorrhage. There is a 2.3 x 2.0 x 1.9 cm enhancing mass within the lateral aspect of the left cerebellum. Associated vasogenic edema throughout the left cerebellum with associated mass effect on the fourth ventricle with partial effacement of the left aspect of the fourth  ventricle. Edema partially extends into the inferior aspect of the cerebral vermis. The mass abuts the distal transverse and proximal sigmoid sinus on the left. The venous sinuses are patent at this level. There is a small focus of susceptibility within the mass which may reflect small focus of hemorrhage versus mineralization. T2 FLAIR hyperintensity in the periventricular and subcortical white matter compatible with moderate chronic microvascular ischemic changes. No midline shift. No hydrocephalus. The sella is unremarkable. Normal flow voids. ORBITS: No acute abnormality. SINUSES: Mucosal thickening in the left sphenoid sinus. BONES AND SOFT TISSUES: Normal bone marrow signal and enhancement. No acute soft tissue abnormality. IMPRESSION: 1. 2.3 x 2.0 x 1.9 cm enhancing mass in the lateral aspect of the left cerebellum concerning for metastatic disease. Surrounding vasogenic edema and mass effect on the fourth ventricle. 2. The mass abuts the distal transverse and proximal sigmoid sinus on the left, which remain patent. 3. Moderate chronic microvascular ischemic changes. Electronically signed by: Donnice Mania MD 06/06/2024 06:19 PM EDT RP Workstation: [id number]   CT Head Wo Contrast Result Date: 06/06/2024 EXAM: CT HEAD WITHOUT CONTRAST 06/06/2024 01:09:39 PM TECHNIQUE: CT of the head was performed without the administration of intravenous contrast. Automated exposure control, iterative reconstruction, and/or weight based adjustment of the mA/kV was utilized to reduce the radiation dose to as low as reasonably achievable. COMPARISON: CT head 05/22/2023. CLINICAL HISTORY: ams, vision changes. FINDINGS: BRAIN AND VENTRICLES: No acute hemorrhage. No evidence of acute infarct. There is edema within the left cerebellum with associated mass effect and partial effacement of the 4th ventricle. Findings are most concerning for vasogenic edema in the setting of possible mass lesion. Nonspecific hypoattenuation in the  periventricular and subcortical white matter, most likely representing chronic microvascular ischemic changes. Mild parenchymal volume loss. Atherosclerosis of the carotid siphons. No extra-axial collection. ORBITS: No acute abnormality. SINUSES: Chronic left sphenoid sinusitis. SOFT TISSUES AND SKULL: No acute soft tissue abnormality. No skull fracture. IMPRESSION: 1. Edema within the left cerebellum with associated mass effect and partial effacement of the 4th ventricle, concerning for vasogenic edema in the setting of possible mass lesion. Recommend MRI  brain with and without contrast for further evaluation. 2. Mild chronic microvascular ischemic changes and mild parenchymal volume loss. 3. Chronic left sphenoid sinusitis. Electronically signed by: Donnice Mania MD 06/06/2024 01:52 PM EDT RP Workstation: [id number]   CT ABDOMEN PELVIS WO CONTRAST Result Date: 06/06/2024 CLINICAL DATA:  Abdominal pain, nausea, and vomiting EXAM: CT ABDOMEN AND PELVIS WITHOUT CONTRAST TECHNIQUE: Multidetector CT imaging of the abdomen and pelvis was performed following the standard protocol without IV contrast. RADIATION DOSE REDUCTION: This exam was performed according to the departmental dose-optimization program which includes automated exposure control, adjustment of the mA and/or kV according to patient size and/or use of iterative reconstruction technique. COMPARISON:  CT abdomen and pelvis dated 01/30/2024 and multiple priors FINDINGS: Lower chest: No focal consolidation or pulmonary nodule in the lung bases. Partially imaged loculated left pleural effusion. Partially imaged heart size is normal. Hepatobiliary: No focal hepatic lesions. No intra or extrahepatic biliary ductal dilation. Normal gallbladder. Pancreas: No focal lesions or main ductal dilation. Spleen: Normal in size without focal abnormality. Adrenals/Urinary Tract: No adrenal nodules. Bilateral renal lesions, better evaluated on prior MRI dated 09/27/2022.  No hydronephrosis. Nonobstructing left interpolar stone measures 10 mm. Focal mural thickening of the anterior inferior bladder (2:75) not substantially changed dating back to CT PET dated 07/09/2021, where it was not hypermetabolic. This finding may be secondary to inferior bladder herniation. Small cystocele. Stomach/Bowel: Large hiatal hernia. No evidence of bowel wall thickening, distention, or inflammatory changes. Mild mural thickening of the transverse Abigail demonstrating mild pericolonic stranding in containing layering fluid levels. Appendix is not discretely seen. Vascular/Lymphatic: Aortic atherosclerosis. No enlarged abdominal or pelvic lymph nodes. Reproductive: No adnexal masses. Other: No free fluid, fluid collection, or free air. Pelvic floor laxity. Musculoskeletal: No acute or abnormal lytic or blastic osseous lesions. Multilevel degenerative changes of the partially imaged thoracic and lumbar spine. Small fat-containing paraumbilical hernia. IMPRESSION: 1. Mild mural thickening of the transverse Abigail demonstrating mild pericolonic stranding in containing layering fluid levels, suggestive of colitis. 2. Large hiatal hernia. 3. Partially imaged loculated left pleural effusion. 4. Nonobstructing left interpolar renal stone measures 10 mm. 5. Pelvic floor laxity with small cystocele. 6.  Aortic Atherosclerosis (ICD10-I70.0). Electronically Signed   By: Limin  Xu M.D.   On: 06/06/2024 13:43   DG Chest 1 View Result Date: 06/06/2024 CLINICAL DATA:  Chest pain, dizziness, nausea and weakness. Vomiting. EXAM: CHEST  1 VIEW COMPARISON:  01/28/2024 and CT chest 04/04/2024. FINDINGS: Trachea is midline. Heart is enlarged. Thoracic aorta is calcified. Pleuroparenchymal scarring in the left perihilar region and base of the left hemithorax, as on CT chest 04/04/2024. No superimposed airspace consolidation. No definite right pleural fluid. IMPRESSION: 1. No acute findings. 2. Post treatment  pleuroparenchymal scarring in the left hemithorax. Electronically Signed   By: Newell Eke M.D.   On: 06/06/2024 13:06      IMPRESSION/PLAN: Brain metastasis from Stage IIIA (T2a, N2, M0) non-small cell lung cancer   We reviewed this patient's current work-up today. She presents with a 2.3 cm left cerebellar mass, most likely consistent with metastatic disease from lung primary. Of note, she has a history of stage III NSCLC diagnosed in 2023 and treated with chemoradiation and consolidative immunotherapy.   Dr. Izell is recommending stereotactic radiosurgery [facility]). SRS can be given pre-operatively (1 fractions) or definitively (3-5 fractions).  3T MRI ordered to help with treatment planning. We will discuss patient's case with the neurosurgery team to determine if she is a surgical  candidate and the proper treatment plan.. Patient expressed understanding and is agreeable to either option.   Today, I talked to the patient and family about the findings and work-up thus far.  We discussed the natural history of brain metastases and general treatment, highlighting the role of radiotherapy in the management.  We discussed the available radiation techniques, and focused on the details of logistics and delivery.  We reviewed the anticipated acute and late sequelae associated with radiation in this setting. The patient was encouraged to ask questions that I answered to the best of my ability. The patient would like to proceed with radiation and will be scheduled for CT simulation.   On date of service, in total, we spent 60 minutes on this encounter. Patient was seen in person. _________________________________________    Leeroy Due, PA-C

## 2024-06-07 NOTE — Consult Note (Signed)
 Consultation Note Date: 06/07/2024   Patient Name: Abigail [name]  DOB: 1936-06-20  MRN: [id number]  Age / Sex: 88 y.o., female  PCP: [name], FNP Referring Physician: Christobal Guadalajara, MD  Reason for Consultation: Establishing goals of care  HPI/Patient Profile: 88 y.o. female   admitted on 06/06/2024 with   PMH of  anemia, chronic kidney disease, COPD/ chronic respiratory failure on 3 L of oxygen  at baseline, hypothyroidism, history of lung cancer, presented to hospital from home with weakness, dizziness nausea and vomiting for 1 to 2 hours PTA.  Patient also had near syncopal episode with dizziness and lightheadedness and  some blurriness of eyes.   At baseline, able to walk on a walker.  Son lives with her at home.  Has history of lung cancer status post immunotherapy, radiation treatment and some rounds of chemo.  No trouble urinating.  Denies abdominal pain or diarrhea.   CT head scan showed edema within the left cerebellum with associated mass effect and partial effacement of the fourth ventricle concerning for vasogenic edema in the setting of possible mass lesion. CT scan of the abdomen and pelvis showed mild mural thickening of the transverse colon suggestive of colitis.  Admitted for treatment and workup.  Patient and her family face treatment option decisions, advanced directive decisions and anticipatory care needs.   Clinical Assessment and Goals of Care:  This NP Ronal Plants reviewed medical records, received report from team, assessed the patient and then meet at the patient's bedside  to discuss diagnosis, prognosis, GOC, EOL wishes disposition and options.   Concept of Palliative Care was introduced as specialized medical care for people and their families living with serious illness.  If focuses on providing relief from the symptoms and stress of a serious illness.  The goal is to improve  quality of life for both the patient and the family.   Values and goals of care important to patient and family were attempted to be elicited.  Created space and opportunity for patient to explore thoughts and feelings regarding current medical situation. . Patient verbalizes feeling somewhat overwhelmed having coming to the hospital thinking I have a UTI and now hearing what the doctors are talking about--referring to a brain lesion.  A  discussion was had today regarding advanced directives.  Concepts specific to code status, artifical feeding and hydration, continued IV antibiotics and rehospitalization was had.     Patient tells me she lives at home with her son.  At baseline she is basically independent and cares for herself.  She speaks primarily to the fact that I do not have dementia, I am not hard of hearing, and I can see without glasses She spends her days mostly watching television and following politics. She was born in [facility] and is proud having played high school basketball. 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 She has 1 son [name] who she lives with and a daughter who  lives in [place]   She has a strong faith and a sense of not ready to phase out of life just yet.  She believes that the [facility] has a plan for her.  Patient wishes to gather more information specifically from oncology and all of her treatment team before making any kind of medical decisions.   Time for outcomes        Later in the afternoon I was able to speak to her son/Tony by phone.  I spoke with him also regarding overall medical situation, and important pending decisions regarding treatment options, advanced directives and anticipatory care needs. Her son wants to be present and supportive however he is very transparent that his work during the month of October is all consuming.  He is always available by telephone.  Once oncology has  weighed in he will look for an opportunity to be able to come to the bedside for full conversation regarding plan of care in the next few days.  He tells me that in the past he has encouraged his mother in conversation regarding advance care planning and she has never followed through.   He will support what ever decisions  his mother makes for herself.  Son appreciates updates from the medical team.     Questions and concerns addressed.  Patient  encouraged to call with questions or concerns.     PMT will continue to support holistically.          No documented H POA or advance care planning documents.  Patient tells me her son/ Azya Barbero is her main support person and decision maker in the event he could not make decisions for herself.  Will offer support from spiritual care and completing documents       SUMMARY OF RECOMMENDATIONS    Code Status/Advance Care Planning: Full code       - Detailed conversation regarding CODE STATUS decision , today patient will consider DNR/DNI status  Symptom Management:  Per attending   Palliative Prophylaxis:  Bowel Regimen, Delirium Protocol, and Frequent Pain Assessment  Additional Recommendations (Limitations, Scope, Preferences): Full Scope Treatment  Psycho-social/Spiritual:  Desire for further Chaplaincy support:yes Additional Recommendations: Emotional support offered  Prognosis:  Unable to determine  Discharge Planning: To Be Determined      Primary Diagnoses: Present on Admission:  Nausea & vomiting  Hyperlipidemia  Hypothyroidism  CKD (chronic kidney disease), stage III (HCC)  Essential hypertension  Malignant neoplasm of unspecified part of unspecified bronchus or lung (HCC)   I have reviewed the medical record, interviewed the patient and family, and examined the patient. The following aspects are pertinent.  Past Medical History:  Diagnosis Date   Anemia    Arthritis    knees, back   Bacteremia  04/08/2012   CAP (community acquired pneumonia) 04/12/2012   Carotid artery occlusion    Chronic kidney disease    stage 3 ckd no nephrologist   Complete uterine prolapse with prolapse of anterior vaginal wall    Complication of anesthesia    hard to wake up per pt   Constipation    Coronary artery disease    Diverticulitis yrs ago coialitis   Dyspnea    History of blood transfusion    History of radiation therapy    right lung 08/05/2021-09/18/2021  Dr Lynwood Nasuti   Hypertension    Hypothyroid    LLQ abdominal pain 04/05/2012   Lung cancer (HCC)    Numbness    in hands at  times   Pneumonia    Pre-diabetes    Scoliosis    STEMI (ST elevation myocardial infarction) (HCC) 10/26/2019   DES RCA   Wears dentures    full dentures   Wears glasses    for reading   Social History   Socioeconomic History   Marital status: Widowed    Spouse name: Not on file   Number of children: Not on file   Years of education: Not on file   Highest education level: Not on file  Occupational History   Occupation: semi retired  Tobacco Use   Smoking status: Former    Current packs/day: 0.00    Average packs/day: 1.5 packs/day for 35.0 years (52.5 ttl pk-yrs)    Types: Cigarettes    Start date: 04/06/1951    Quit date: 04/05/1986    Years since quitting: 38.2   Smokeless tobacco: Never  Vaping Use   Vaping status: Never Used  Substance and Sexual Activity   Alcohol use: No   Drug use: No   Sexual activity: Not Currently    Birth control/protection: Post-menopausal  Other Topics Concern   Not on file  Social History Narrative   Not on file   Social Drivers of Health   Financial Resource Strain: Medium Risk (03/09/2024)   Overall Financial Resource Strain (CARDIA)    Difficulty of Paying Living Expenses: Somewhat hard  Food Insecurity: No Food Insecurity (06/07/2024)   Hunger Vital Sign    Worried About Running Out of Food in the Last Year: Never true    Ran Out of Food in the  Last Year: Never true  Transportation Needs: No Transportation Needs (06/07/2024)   PRAPARE - Administrator, Civil Service (Medical): No    Lack of Transportation (Non-Medical): No  Physical Activity: Inactive (03/09/2024)   Exercise Vital Sign    Days of Exercise per Week: 0 days    Minutes of Exercise per Session: 0 min  Stress: No Stress Concern Present (03/09/2024)   [employer] of Occupational Health - Occupational Stress Questionnaire    Feeling of Stress: Only a little  Recent Concern: Stress - Stress Concern Present (03/09/2024)   [employer] of Occupational Health - Occupational Stress Questionnaire    Feeling of Stress: To some extent  Social Connections: Moderately Isolated (06/07/2024)   Social Connection and Isolation Panel    Frequency of Communication with Friends and Family: More than three times a week    Frequency of Social Gatherings with Friends and Family: Once a week    Attends Religious Services: More than 4 times per year    Active Member of [employer] or Organizations: No    Attends Banker Meetings: Never    Marital Status: Widowed   Family History  Problem Relation Age of Onset   Hypertension Mother    Colon cancer Neg Hx    Stomach cancer Neg Hx    Esophageal cancer Neg Hx    Scheduled Meds:  budesonide -glycopyrrolate -formoterol   2 puff Inhalation BID   heparin   5,000 Units Subcutaneous Q8H   levothyroxine   175 mcg Oral Q0600   metoprolol  succinate  50 mg Oral Daily   sodium chloride  flush  3 mL Intravenous Q12H   Continuous Infusions:  sodium chloride      ceFEPime (MAXIPIME) IV     PRN Meds:.sodium chloride , acetaminophen  **OR** acetaminophen , albuterol , hydrALAZINE , HYDROcodone -acetaminophen , ondansetron  **OR** ondansetron  (ZOFRAN ) IV, polyethylene glycol, prochlorperazine , sodium chloride  flush Medications Prior to Admission:  Prior to  Admission medications   Medication Sig Start Date End Date Taking? Authorizing  Provider  acetaminophen  (TYLENOL ) 500 MG tablet Take 500 mg by mouth every 6 (six) hours as needed (for leg pain).   Yes [provider]  albuterol  (VENTOLIN  HFA) 108 (90 Base) MCG/ACT inhaler TAKE 2 PUFFS BY MOUTH EVERY 6 HOURS AS NEEDED FOR WHEEZE OR SHORTNESS OF BREATH 12/20/21  Yes Heilingoetter, Cassandra L, PA-C  aspirin  EC 81 MG tablet Take 81 mg by mouth at bedtime.   Yes [provider]  atorvastatin  (LIPITOR ) 80 MG tablet Take 80 mg by mouth daily as needed (for cholesterol). 04/15/24  Yes [provider]  BREZTRI  AEROSPHERE 160-9-4.8 MCG/ACT AERO inhaler INHALE 2 PUFFS INTO THE LUNGS IN THE MORNING AND ALSO 2 PUFFS AT BEDTIME Patient taking differently: Inhale 2 puffs into the lungs daily as needed (for shortness of breath). 12/08/23  Yes Shelah Lamar RAMAN, MD  estradiol  (ESTRACE ) 0.1 MG/GM vaginal cream Place 0.5g twice a week at opening of vagina 10/13/23  Yes Marilynne Rosaline SAILOR, MD  Evolocumab  (REPATHA  SURECLICK) 140 MG/ML SOAJ Inject 140 mg into the skin every 14 (fourteen) days. 04/14/24  Yes Patwardhan, Manish J, MD  ferrous sulfate  325 (65 FE) MG EC tablet TAKE 1 TABLET BY MOUTH EVERY DAY WITH BREAKFAST 03/13/24  Yes Heilingoetter, Cassandra L, PA-C  levothyroxine  (SYNTHROID ) 175 MCG tablet TAKE 1 TABLET BY MOUTH EVERY MORNING ON MONDAY - FRIDAY Patient taking differently: Take 175 mcg by mouth daily. 04/14/24  Yes [name], FNP  lisinopril  (ZESTRIL ) 10 MG tablet TAKE 1 TABLET BY MOUTH EVERY DAY 03/30/24  Yes [name], FNP  nitrofurantoin  (MACRODANTIN ) 100 MG capsule Take 100 mg by mouth daily.   Yes [provider]  nitroGLYCERIN  (NITROSTAT ) 0.4 MG SL tablet PLACE 1 TABLET UNDER THE TONGUE EVERY 5 (FIVE) MINUTES X 3 DOSES AS NEEDED FOR CHEST PAIN. 07/28/23  Yes [name], FNP  OXYGEN  Inhale 3 L into the lungs continuous.   Yes [provider]  trospium  (SANCTURA ) 20 MG tablet Take 1 tablet (20 mg total) by mouth 2 (two) times daily.  10/13/23 10/07/24 Yes Marilynne Rosaline SAILOR, MD  metoprolol  succinate (TOPROL -XL) 50 MG 24 hr tablet Take 50 mg by mouth every evening. 04/15/24   [provider]   Allergies  Allergen Reactions   Codeine Nausea And Vomiting   Norvasc  [Amlodipine ] Rash and Other (See Comments)    rash   Review of Systems  Neurological:  Positive for weakness.    Physical Exam Cardiovascular:     Rate and Rhythm: Normal rate.  Pulmonary:     Effort: Pulmonary effort is normal.  Neurological:     Mental Status: She is alert and oriented to person, place, and time.     Vital Signs: BP (!) 133/56 (BP Location: Right Arm)   Pulse 66   Temp 99.1 F (37.3 C) (Oral)   Resp 17   Ht 5' 6 (1.676 m)   Wt 71.7 kg   SpO2 97%   BMI 25.50 kg/m  Pain Scale: 0-10   Pain Score: 0-No pain   SpO2: SpO2: 97 % O2 Device:SpO2: 97 % O2 Flow Rate: .O2 Flow Rate (L/min): 3 L/min  IO: Intake/output summary:  Intake/Output Summary (Last 24 hours) at 06/07/2024 1334 Last data filed at 06/06/2024 1800 Gross per 24 hour  Intake --  Output 300 ml  Net -300 ml    LBM:   Baseline Weight: Weight: 71.7 kg Most recent weight:  Weight: 71.7 kg     Palliative Assessment/Data: 50% at best today     Time: 75 minutes   Signed by: Ronal Plants, NP   Please contact Palliative Medicine Team phone at [phone] for questions and concerns.  For individual provider: See Tracey

## 2024-06-07 NOTE — Progress Notes (Signed)
 PROGRESS NOTE JANELLIE TENNISON  FMW:[id number] DOB: [birth year] DOA: 06/06/2024 PCP: [name], FNP  Brief Narrative/Hospital Course: Abigail [name] is a 88 y.o. female with PMH of  anemia, chronic kidney disease, COPD/ chronic respiratory failure on 3 L of oxygen  at baseline, hypothyroidism, history of lung cancer, presented to hospital from home with weakness, dizziness nausea and vomiting for 1 to 2 hours PTA.Patient also had near syncopal episode with dizziness and lightheadedness and  some blurriness of eyes. Of note, patient had left lower leg surgery to remove cancer and her dressings were changed 10/12.  She denied increasing shortness of breath, dyspnea, chest pain palpitation fever chills, rigor or increasing cough. At baseline, able to walk on a walker.  Son lives with her at home.  Has history of lung cancer status post immunotherapy, radiation treatment and some rounds of chemo.  No trouble urinating.  Denies abdominal pain or diarrhea.   In the ED, vitals were stable.  Labs were notable for mild hyperkalemia with potassium of 5.2 with creatinine elevation at 1.5.  Hemoglobin was 11.2.  CBC was notable for leukocytosis with WBC at 14.1 hemoglobin 9.9. X-ray of the chest showed no acute findings.  CT head scan showed edema within the left cerebellum with associated mass effect and partial effacement of the fourth ventricle concerning for vasogenic edema in the setting of possible mass lesion. CT scan of the abdomen and pelvis showed mild mural thickening of the transverse colon suggestive of colitis. In the ED, patient received Ringer lactate 500 mL, Zofran  IV x 1, neurosurgery was consulted and patient was considered for admission to the hospital for further evaluation and treatment.  Subjective: Seen and examined today Overnight afebrile BP 130s-150s, on nasal cannula Labs reviewed creatinine slightly better 1.4 potassium bicarb stable leukocytosis 13.4 from 14 hemoglobin 8.6 Pleasant  alert and oriented and at baseline.  No more nausea vomiting this morning had some last night  Assessment and plan:   Dizziness, nausea, vomiting likely secondary to brain lesion: CT head scan concerning for left cerebellar mass with associated mass effect and vasogenic edema.No obvious hydronephrosis reported.  Surgery consulted underwent MRI brain that showed -1. 2.3 x 2.0 x 1.9 cm enhancing mass in the lateral aspect of the left cerebellum concerning for metastatic disease. Surrounding vasogenic edema and mass effect on the fourth ventricle. The mass abuts the distal transverse and proximal sigmoid sinus on the left, which remain patent CT chest  pending. Dr. Colon following> await further recommendation and input.  Consulted palliative care for GPC. Continue supportive care.  Patient had cancer recently resected from the left ankle.  Oncology has been notified as well.  CKD 3A Mild hyperkalemia resolved: B/L slightly better today.  Monitor  1.2-1.4. Recent Labs    01/30/24 0434 01/31/24 0418 02/01/24 0432 02/02/24 0431 02/03/24 0442 02/04/24 0439 02/17/24 1150 04/04/24 1435 06/06/24 1428 06/06/24 1432 06/07/24 0340  BUN 45* 51* 50* 43* 36* 34* 38* 42* 39* 38* 34*  CREATININE 1.59* 1.74* 1.67* 1.41* 1.29* 1.39* 1.77* 1.37* 1.50* 1.54* 1.47*  CO2 26 23 24 24 24 23  19* 29  --  23 23  K 4.6 4.8 4.9 4.3 4.5 4.4 5.1 4.8 5.2* 5.1 4.4     UTI Leukocytosis with neutrophilia: Unlikely to be infection related.  UA with pyuria more than 50 leukocyte large nitrate positive concerning for UTI Start cefepime based upon previous Pseudomonas infection in June 2025    History of lung cancer.  Now finding  concerning for metastatic lesion, follow-up further scan report    Chronic hypoxic respiratory failure COPD: on 3 L  [place] Continue inhalers.   Hypothyroidism. Continue Synthroid    Anemia of chronic disease stage IIIb: Stable   Essential hypertension: Blood pressure fairly  controlled continue home metoprolol . Hold off with lisinopril      Mobility: PT Orders: Active  PT Follow up Rec: Home Health Pt10/14/2025 1200   DVT prophylaxis: heparin  injection 5,000 Units Start: 06/06/24 2200 SCDs Start: 06/06/24 1500 Code Status:   Code Status: Full Code Family Communication: plan of care discussed with patient/  at bedside. Patient status is: Remains hospitalized because of severity of illness Level of care: Telemetry Medical   Dispo: The patient is from: home and son lives with her            Anticipated disposition: TBD.  PT OT requested Objective: Vitals last 24 hrs: Vitals:   06/06/24 1800 06/07/24 0051 06/07/24 0357 06/07/24 0746  BP: (!) 159/69 (!) 142/52 (!) 152/53 (!) 133/56  Pulse: 82 80 75 66  Resp:   17   Temp: 97.8 F (36.6 C) 99.3 F (37.4 C) 98.6 F (37 C) 99.1 F (37.3 C)  TempSrc: Oral  Skin Oral  SpO2: 100% 97%  97%  Weight:      Height:        Physical Examination: General exam: alert awake, oriented, older than stated age HEENT:Oral mucosa moist, Ear/Nose WNL grossly Respiratory system: Bilaterally clear BS,no use of accessory muscle Cardiovascular system: S1 & S2 +, No JVD. Gastrointestinal system: Abdomen soft,NT,ND, BS+ Nervous System: Alert, awake, moving all extremities,and following commands. Extremities: extremities warm, leg edema neg Skin: No rashes,no icterus. MSK: Normal muscle bulk,tone, power   Medications reviewed:  Scheduled Meds:  budesonide -glycopyrrolate -formoterol   2 puff Inhalation BID   heparin   5,000 Units Subcutaneous Q8H   levothyroxine   175 mcg Oral Q0600   metoprolol  succinate  50 mg Oral Daily   sodium chloride  flush  3 mL Intravenous Q12H   Continuous Infusions:  sodium chloride      ceFEPime (MAXIPIME) IV     Diet: Diet Order             Diet full liquid Fluid consistency: Thin  Diet effective now                   Data Reviewed: I have personally reviewed following labs and  imaging studies ( see epic result tab) CBC: Recent Labs  Lab 06/06/24 1428 06/06/24 1432 06/07/24 0340  WBC  --  14.1* 13.4*  NEUTROABS  --  12.9*  --   HGB 11.2* 9.9* 8.6*  HCT 33.0* 33.4* 28.3*  MCV  --  93.0 91.0  PLT  --  233 196   CMP: Recent Labs  Lab 06/06/24 1428 06/06/24 1432 06/07/24 0340  NA 139 134* 134*  K 5.2* 5.1 4.4  CL 105 103 101  CO2  --  23 23  GLUCOSE 152* 150* 172*  BUN 39* 38* 34*  CREATININE 1.50* 1.54* 1.47*  CALCIUM   --  9.0 8.7*  MG  --   --  1.9   GFR: Estimated Creatinine Clearance: 27.9 mL/min (A) (by C-G formula based on SCr of 1.47 mg/dL (H)). Recent Labs  Lab 06/06/24 1432 06/07/24 0340  AST 19 16  ALT 14 12  ALKPHOS 116 100  BILITOT 0.3 0.6  PROT 7.8 6.5  ALBUMIN 3.8 3.1*    Recent Labs  Lab 06/06/24 1432  LIPASE 29   No results for input(s): AMMONIA in the last 168 hours. Coagulation Profile: No results for input(s): INR, PROTIME in the last 168 hours. Unresulted Labs (From admission, onward)     Start     Ordered   06/06/24 1149  CBC with Differential  Once,   STAT        06/06/24 1149           Antimicrobials/Microbiology: Anti-infectives (From admission, onward)    Start     Dose/Rate Route Frequency Ordered Stop   06/07/24 1215  ceFEPIme (MAXIPIME) 2 g in sodium chloride  0.9 % 100 mL IVPB        2 g 200 mL/hr over 30 Minutes Intravenous Daily 06/07/24 1125           Component Value Date/Time   SDES  01/28/2024 2009    URINE, CLEAN CATCH Performed at [facility], [address] [place], [place] [address]    SPECREQUEST  01/28/2024 2009    NONE Performed at [facility], [address], [place], [place] [address]    CULT 70,000 COLONIES/mL PSEUDOMONAS AERUGINOSA (A) 01/28/2024 2009   REPTSTATUS 01/30/2024 FINAL 01/28/2024 2009    Procedures:    Mennie LAMY, MD [facility] 06/07/2024, 1:36 PM

## 2024-06-07 NOTE — Plan of Care (Signed)

## 2024-06-07 NOTE — Consult Note (Incomplete)
 WOC Nurse Consult Note: Reason for Consult: left leg surgical wound PTA  Wound type: Pressure Injury POA: Yes/No/NA Measurement: Wound bed: Drainage (amount, consistency, odor)  Periwound: Dressing procedure/placement/frequency: Conservative sharp wound debridement (CSWD performed at the bedside):

## 2024-06-07 NOTE — Progress Notes (Signed)
 Admission Nurse Note: Patient alert/oriented x4 on 2LNC, good historian, back to cognitive baseline. Admission completed. Belongings reviewed including portable oxygen . Receiving meals on wheels, but no other home health services.

## 2024-06-07 NOTE — Plan of Care (Signed)
 Patient calm and cooperative A&O X3. Patient had multiple episodes of emesis. PRN nausea medication given. Hospitalist notified of ongoing emesis, new orders placed. Patient one person assist to bedside commode. Bed alarm maintained, bed in lowest position and call bell in reach.  Problem: Education: Goal: Knowledge of General Education information will improve Description: Including pain rating scale, medication(s)/side effects and non-pharmacologic comfort measures Outcome: Progressing   Problem: Health Behavior/Discharge Planning: Goal: Ability to manage health-related needs will improve Outcome: Progressing   Problem: Activity: Goal: Risk for activity intolerance will decrease Outcome: Progressing   Problem: Nutrition: Goal: Adequate nutrition will be maintained Outcome: Progressing   Problem: Coping: Goal: Level of anxiety will decrease Outcome: Progressing   Problem: Pain Managment: Goal: General experience of comfort will improve and/or be controlled Outcome: Progressing   Problem: Safety: Goal: Ability to remain free from injury will improve Outcome: Progressing

## 2024-06-07 NOTE — Evaluation (Signed)
 Occupational Therapy Evaluation Patient Details Name: Abigail [name] MRN: [id number] DOB: Feb 01, 1936 Today's Date: 06/07/2024   History of Present Illness   Pt is a 88 y.o. female who presented 10/13 due to nausea and vomiting. Pt also noted to reported having a near syncope episode and blurriness of eyes. Per chart CT of the head showed edema within the left cerebellum with associated mass effect and partial effacement of the fourth ventricle concerning for vasogenic edema in the setting of possible mass lesion.  CT scan showed suggestive of colitis. MRI showed enhancing mass in the lateral aspect of the left cerebellum concerning for metastatic disease. PMH: anemia, CKD,  chronic respiratory failure on 3 L of oxygen  at baseline, hypothyroidism, history of lung cancer, recent skin removal of L ankle for skin cancer     Clinical Impressions Pt reported at PLOF they complete household distant ambulation with 4WW and they live with their son but they work during the day. She reports on how she is fearful of falling when going to the bathroom at home with o2 tubing and often takes it off. At this time she was able to ambulate with RW to [facility] and completed peri care with CGA and then to chair. Pt then agreed to complete some room level ambulation with CGA and RW. At this time recommendation for [facility] therapy at discharge and Acute Occupational Therapy to follow.      If plan is discharge home, recommend the following:   A little help with bathing/dressing/bathroom;Assistance with cooking/housework     Functional Status Assessment   Patient has had a recent decline in their functional status and demonstrates the ability to make significant improvements in function in a reasonable and predictable amount of time.     Equipment Recommendations   BSC/3in1     Recommendations for Other Services         Precautions/Restrictions   Precautions Precautions: Fall Recall of  Precautions/Restrictions: Impaired Precaution/Restrictions Comments: pt is fearful of falling Restrictions Weight Bearing Restrictions Per Provider Order: No     Mobility Bed Mobility Overal bed mobility: Needs Assistance Bed Mobility: Supine to Sit     Supine to sit: Supervision, HOB elevated, Used rails     General bed mobility comments: increase in time    Transfers Overall transfer level: Needs assistance Equipment used: Rolling walker (2 wheels) Transfers: Sit to/from Stand Sit to Stand: Contact guard assist                  Balance Overall balance assessment: Needs assistance Sitting-balance support: Feet supported Sitting balance-Leahy Scale: Good     Standing balance support: Bilateral upper extremity supported, Single extremity supported, No upper extremity supported Standing balance-Leahy Scale: Fair Standing balance comment: very kyphotic in posture and needs cues on handplacement for UE support                           ADL either performed or assessed with clinical judgement   ADL Overall ADL's : Needs assistance/impaired Eating/Feeding: Set up;Sitting   Grooming: Wash/dry face;Set up;Sitting   Upper Body Bathing: Set up;Sitting   Lower Body Bathing: Contact guard assist;Sit to/from stand   Upper Body Dressing : Set up;Sitting   Lower Body Dressing: Contact guard assist;Sit to/from stand   Toilet Transfer: Contact guard assist;BSC/3in1;Rolling walker (2 wheels)   Toileting- Clothing Manipulation and Hygiene: Contact guard assist;Sit to/from stand       Functional mobility during  ADLs: Contact guard assist;Rolling walker (2 wheels);Cueing for sequencing;Cueing for safety       Vision         Perception         Praxis         Pertinent Vitals/Pain Pain Assessment Pain Assessment: Faces Faces Pain Scale: Hurts little more Pain Location: LLE from skin sx Pain Descriptors / Indicators: Discomfort, Grimacing,  Guarding Pain Intervention(s): Limited activity within patient's tolerance, Monitored during session, Repositioned     Extremity/Trunk Assessment Upper Extremity Assessment Upper Extremity Assessment: Generalized weakness (Pt reported chronic AROM due to fall in the past to about B 80 deg shoulder flexion/abdusion but WFL to be able to complete UE dressing and hair care)   Lower Extremity Assessment Lower Extremity Assessment: Defer to PT evaluation   Cervical / Trunk Assessment Cervical / Trunk Assessment: Kyphotic   Communication Communication Communication: No apparent difficulties   Cognition Arousal: Alert Behavior During Therapy: Flat affect               OT - Cognition Comments: pt noted to be very sleepy but answered all question appriopatly in session                 Following commands: Impaired Following commands impaired: Follows multi-step commands with increased time     Cueing  General Comments   Cueing Techniques: Verbal cues  on 3L throughout with o2 in 90s%   Exercises     Shoulder Instructions      Home Living Family/patient expects to be discharged to:: Private residence Living Arrangements: Children (son but works during the day) Available Help at Discharge: Family;Available PRN/intermittently Type of Home: House Home Access: Stairs to enter Entrance Stairs-Number of Steps: 1 Entrance Stairs-Rails: None Home Layout: One level     Bathroom Shower/Tub: Chief Strategy Officer: Handicapped height     Home Equipment: Shower seat;Grab bars - tub/shower;Hand held shower head;Toilet riser;Rollator (4 wheels);Cane - quad   Additional Comments: on 3L O2 at all times      Prior Functioning/Environment Prior Level of Function : Independent/Modified Independent             Mobility Comments: walks with rollator, on 3L O2 24/7 but then reports taking it off to go to the bathroom?, denies falls in past 6 months ADLs  Comments: doesn't drive; independent with showering using shower seat    OT Problem List: Decreased strength;Decreased activity tolerance;Impaired balance (sitting and/or standing);Decreased safety awareness;Decreased knowledge of use of DME or AE;Pain   OT Treatment/Interventions: Self-care/ADL training;Therapeutic exercise;DME and/or AE instruction;Therapeutic activities;Patient/family education;Balance training      OT Goals(Current goals can be found in the care plan section)   Acute Rehab OT Goals Patient Stated Goal: to go home OT Goal Formulation: With patient Time For Goal Achievement: 06/21/24 Potential to Achieve Goals: Good   OT Frequency:  Min 2X/week    Co-evaluation              AM-PAC OT 6 Clicks Daily Activity     Outcome Measure Help from another person eating meals?: None Help from another person taking care of personal grooming?: A Little Help from another person toileting, which includes using toliet, bedpan, or urinal?: A Little Help from another person bathing (including washing, rinsing, drying)?: A Little Help from another person to put on and taking off regular upper body clothing?: None Help from another person to put on and taking off regular lower body clothing?:  A Little 6 Click Score: 20   End of Session Equipment Utilized During Treatment: Gait belt;Rolling walker (2 wheels) Nurse Communication: Mobility status (need for wound care)  Activity Tolerance: Patient tolerated treatment well Patient left: in chair;with call bell/phone within reach;with chair alarm set  OT Visit Diagnosis: Unsteadiness on feet (R26.81);Other abnormalities of gait and mobility (R26.89);Muscle weakness (generalized) (M62.81);Pain Pain - Right/Left: Left Pain - part of body: Ankle and joints of foot                Time: 0840-0909 OT Time Calculation (min): 29 min Charges:  OT General Charges $OT Visit: 1 Visit OT Evaluation $OT Eval Low Complexity: 1 Low OT  Treatments $Self Care/Home Management : 8-22 mins  Warrick POUR OTR/L  Acute Rehab Services  [phone] office number   Warrick Berber 06/07/2024, 11:00 AM

## 2024-06-07 NOTE — Progress Notes (Signed)
 Patient ID: Abigail [name], female   DOB: Jan 08, 1936, 88 y.o.   MRN: [id number]  I have reviewed the MRI of the brain and note that demonstrates a left cerebellar lesion measuring approximately 22 mm in diameter.  This is most likely consistent with her long cancer primary.  She has had a cancer recently resected from the left ankle that has been creating a fair amount of pain for her.  I have contacted the oncology center so that we may discuss and plan an appropriate intervention.

## 2024-06-07 NOTE — Progress Notes (Signed)
 Transition of Care [facility]) - Inpatient Brief Assessment   Patient Details  Name: LIESA TSAN MRN: [id number] Date of Birth: Jan 04, 1936  Transition of Care [facility]) CM/SW Contact:    Rosaline JONELLE Joe, RN Phone Number: 06/07/2024, 3:58 PM   Clinical Narrative: CM met with the patient at the bedside regarding - mass lesion in brain.  Patient is recommended for home health services.   Patient was offered Medicare choice regarding home health and patient states that she was not happy with enhabit and has had [facility] few years ago and prefers [place] .  HH orders placed for PT, OT, RN, aide to be co-signed by MD.  [facility] called and Darleene, RNCM accepted the patient.  Patient was seen by Palliative Care today.  DME at the home includes shower seat, toilet riswer, rolator, Cane and oxygen  at home at 3 L/min [place] through [employer].  No other IP Care management needs at this time.   Transition of Care Asessment: Insurance and Status: (P) Insurance coverage has been reviewed Patient has primary care physician: (P) Yes Home environment has been reviewed: (P) from home with son Prior level of function:: (P) self Prior/Current Home Services: (P) No current home services Social Drivers of Health Review: (P) SDOH reviewed interventions complete Readmission risk has been reviewed: (P) Yes Transition of care needs: (P) transition of care needs identified, TOC will continue to follow

## 2024-06-08 ENCOUNTER — Inpatient Hospital Stay (HOSPITAL_COMMUNITY)

## 2024-06-08 ENCOUNTER — Telehealth: Payer: Self-pay

## 2024-06-08 DIAGNOSIS — R9082 White matter disease, unspecified: Secondary | ICD-10-CM | POA: Diagnosis not present

## 2024-06-08 DIAGNOSIS — R900 Intracranial space-occupying lesion found on diagnostic imaging of central nervous system: Secondary | ICD-10-CM | POA: Diagnosis not present

## 2024-06-08 DIAGNOSIS — G9389 Other specified disorders of brain: Secondary | ICD-10-CM | POA: Diagnosis not present

## 2024-06-08 LAB — CBC
HCT: 26.4 % — ABNORMAL LOW (ref 36.0–46.0)
Hemoglobin: 7.9 g/dL — ABNORMAL LOW (ref 12.0–15.0)
MCH: 27.3 pg (ref 26.0–34.0)
MCHC: 29.9 g/dL — ABNORMAL LOW (ref 30.0–36.0)
MCV: 91.3 fL (ref 80.0–100.0)
Platelets: 190 K/uL (ref 150–400)
RBC: 2.89 MIL/uL — ABNORMAL LOW (ref 3.87–5.11)
RDW: 16 % — ABNORMAL HIGH (ref 11.5–15.5)
WBC: 13.2 K/uL — ABNORMAL HIGH (ref 4.0–10.5)
nRBC: 0 % (ref 0.0–0.2)

## 2024-06-08 LAB — BASIC METABOLIC PANEL WITH GFR
Anion gap: 10 (ref 5–15)
BUN: 29 mg/dL — ABNORMAL HIGH (ref 8–23)
CO2: 23 mmol/L (ref 22–32)
Calcium: 8.6 mg/dL — ABNORMAL LOW (ref 8.9–10.3)
Chloride: 100 mmol/L (ref 98–111)
Creatinine, Ser: 1.63 mg/dL — ABNORMAL HIGH (ref 0.44–1.00)
GFR, Estimated: 31 mL/min — ABNORMAL LOW (ref >60)
Glucose, Bld: 129 mg/dL — ABNORMAL HIGH (ref 70–99)
Potassium: 4.4 mmol/L (ref 3.5–5.1)
Sodium: 133 mmol/L — ABNORMAL LOW (ref 135–145)

## 2024-06-08 LAB — IRON AND TIBC
Iron: <10 ug/dL — ABNORMAL LOW (ref 28–170)
TIBC: 333 ug/dL (ref 250–450)

## 2024-06-08 LAB — FOLATE: Folate: 11.1 ng/mL (ref >5.9)

## 2024-06-08 LAB — VITAMIN B12: Vitamin B-12: 345 pg/mL (ref 180–914)

## 2024-06-08 MED ORDER — DEXAMETHASONE 4 MG PO TABS: 2.0000 mg | ORAL_TABLET | Freq: Two times a day (BID) | ORAL | Status: DC

## 2024-06-08 MED ORDER — GADOBUTROL 1 MMOL/ML IV SOLN
7.0000 mL | Freq: Once | INTRAVENOUS | Status: AC | PRN
  Administered 2024-06-08: 7 mL via INTRAVENOUS

## 2024-06-08 MED ORDER — DEXAMETHASONE 4 MG PO TABS
2.0000 mg | ORAL_TABLET | Freq: Two times a day (BID) | ORAL | Status: DC
  Administered 2024-06-08 – 2024-06-10 (×5): 2 mg via ORAL
  Filled 2024-06-08 (×5): qty 1

## 2024-06-08 NOTE — Patient Outreach (Signed)
 Per review of chart in preparation to contact patient, noted patient is currently inpatient at [facility] with admit date of 06/06/24 for dx: Intracranial mass, dizziness, nausea/vomiting. RN Care Manager telephone visit cancelled. Will plan to f/u with patient upon her discharge home.   Clayborne Ly RN BSN CCM [place]  [facility], [facility] Nurse Care Coordinator  Direct Dial: [phone] Website: [email or web address]

## 2024-06-08 NOTE — Consult Note (Signed)
 WOC Nurse Consult Note: per H&P note patient recently had skin cancer to L lower leg/shin area removed by [facility] Dermat[facility]; no notes from that office available for review  Reason for Consult: L leg wound  Wound type: full thickness r/t skin cancer as above  Pressure Injury POA: NA  Measurement: see nursing flowsheet  Wound bed: 70% tan fibrinous tissue 30% red moist  Drainage (amount, consistency, odor) no purulent foul smelling per MD note  Periwound: intact  Dressing procedure/placement/frequency: Cleanse L lower leg wound with NS, apply Vaseline gauze ([facility] #239) to wound bed daily and secure with silicone foam or ABD pad and Kerlix roll gauze whichever is preferred.   Patient should continue to follow with dermatology as outpatient.  WOC team will not follow Re-consult if further needs arise.   Thank you,    Powell Bar MSN, RN-BC, [employer]

## 2024-06-08 NOTE — Progress Notes (Signed)
 Physical Therapy Treatment Patient Details Name: Abigail [name] MRN: [id number] DOB: Jan 01, 1936 Today's Date: 06/08/2024   History of Present Illness Pt is a 88 y.o. female who presented 10/13 due to nausea and vomiting. Pt also noted to reported having a near syncope episode and blurriness of eyes. Per chart CT of the head showed edema within the left cerebellum with associated mass effect and partial effacement of the fourth ventricle concerning for vasogenic edema in the setting of possible mass lesion.  CT scan showed suggestive of colitis. MRI showed enhancing mass in the lateral aspect of the left cerebellum concerning for metastatic disease. PMH: anemia, CKD,  chronic respiratory failure on 3 L of oxygen  at baseline, hypothyroidism, history of lung cancer, recent skin removal of L ankle for skin cancer    PT Comments  Pt making steady progress with mobility. Not too far from baseline. Agree with plan to return home with home health when medically ready.     If plan is discharge home, recommend the following: A little help with bathing/dressing/bathroom;Assistance with cooking/housework;Assist for transportation;Help with stairs or ramp for entrance;Supervision due to cognitive status   Can travel by private vehicle        Equipment Recommendations  BSC/3in1    Recommendations for Other Services       Precautions / Restrictions Precautions Precautions: Fall Recall of Precautions/Restrictions: Impaired Restrictions Weight Bearing Restrictions Per Provider Order: No     Mobility  Bed Mobility               General bed mobility comments: Pt sitting EOB    Transfers Overall transfer level: Needs assistance Equipment used: Rollator (4 wheels), None Transfers: Sit to/from Stand, Bed to chair/wheelchair/BSC Sit to Stand: Supervision   Step pivot transfers: Supervision       General transfer comment: Assist for safety. Bed to bsc to bed without assistive device     Ambulation/Gait Ambulation/Gait assistance: Contact guard assist Gait Distance (Feet): 150 Feet Assistive device: Rollator (4 wheels) Gait Pattern/deviations: Step-through pattern, Decreased step length - right, Decreased step length - left, Decreased stride length, Shuffle, Trunk flexed Gait velocity: reduced Gait velocity interpretation: <1.31 ft/sec, indicative of household ambulator   General Gait Details: assist for safety. Verbal cues to stay closer to walker and stand more erect   Stairs             Wheelchair Mobility     Tilt Bed    Modified Rankin (Stroke Patients Only) Modified Rankin (Stroke Patients Only) Pre-Morbid Rankin Score: Slight disability Modified Rankin: Slight disability     Balance Overall balance assessment: Needs assistance Sitting-balance support: Feet supported Sitting balance-Leahy Scale: Good     Standing balance support: Bilateral upper extremity supported, Single extremity supported, No upper extremity supported, During functional activity Standing balance-Leahy Scale: Fair                              Hotel manager: No apparent difficulties  Cognition Arousal: Alert Behavior During Therapy: WFL for tasks assessed/performed   PT - Cognitive impairments: No apparent impairments                         Following commands: Intact      Cueing Cueing Techniques: Verbal cues  Exercises      General Comments General comments (skin integrity, edema, etc.): On 3L O2 which is what she is on  at baseline      Pertinent Vitals/Pain Pain Assessment Pain Assessment: Faces Faces Pain Scale: Hurts little more Pain Location: LLE from skin sx Pain Descriptors / Indicators: Discomfort, Grimacing, Guarding, Sore Pain Intervention(s): Limited activity within patient's tolerance, Monitored during session    Home Living                          Prior Function             PT Goals (current goals can now be found in the care plan section) Acute Rehab PT Goals Patient Stated Goal: to feel better Progress towards PT goals: Progressing toward goals    Frequency    Min 2X/week      PT Plan      Co-evaluation              AM-PAC PT 6 Clicks Mobility   Outcome Measure  Help needed turning from your back to your side while in a flat bed without using bedrails?: A Little Help needed moving from lying on your back to sitting on the side of a flat bed without using bedrails?: A Little Help needed moving to and from a bed to a chair (including a wheelchair)?: A Little Help needed standing up from a chair using your arms (e.g., wheelchair or bedside chair)?: A Little Help needed to walk in hospital room?: A Little Help needed climbing 3-5 steps with a railing? : A Little 6 Click Score: 18    End of Session Equipment Utilized During Treatment: Oxygen  Activity Tolerance: Patient tolerated treatment well Patient left: in bed;with call bell/phone within reach;with bed alarm set Nurse Communication: Mobility status PT Visit Diagnosis: Unsteadiness on feet (R26.81);Other abnormalities of gait and mobility (R26.89);Muscle weakness (generalized) (M62.81);Difficulty in walking, not elsewhere classified (R26.2)     Time: 8879-8864 PT Time Calculation (min) (ACUTE ONLY): 15 min  Charges:    $Gait Training: 8-22 mins PT General Charges $$ ACUTE PT VISIT: 1 Visit                     [place] PT Acute Rehabilitation Services Office [phone]    Rodgers ORN [facility] 06/08/2024, 12:45 PM

## 2024-06-08 NOTE — Progress Notes (Addendum)
 Progress Note   Patient: Abigail [name] FMW:[id number] DOB: 1936/04/06 DOA: 06/06/2024     2 DOS: the patient was seen and examined on 06/08/2024   Brief hospital course: 88 y.o. female with PMH of  anemia, chronic kidney disease, COPD/ chronic respiratory failure on 3 L of oxygen  at baseline, hypothyroidism, history of lung cancer, presented to hospital from home with weakness, dizziness nausea and vomiting for 1 to 2 hours PTA.Patient also had near syncopal episode with dizziness and lightheadedness and  some blurriness of eyes . On CT head, pt was found to have edema within the left cerebellum with associated mass effect and partial effacement of the fourth ventricle concerning for vasogenic edema in the setting of possible mass lesion. Neurosurgery was consulted as well as IR  Assessment and Plan: Dizziness, nausea, vomiting likely secondary to brain lesion: -CT head scan concerning for left cerebellar mass with associated mass effect and vasogenic edema.No obvious hydronephrosis reported.   -MRI brain demonstrated -1. 2.3 x 2.0 x 1.9 cm enhancing mass in the lateral aspect of the left cerebellum concerning for metastatic disease. Surrounding vasogenic edema and mass effect on the fourth ventricle. The mass abuts the distal transverse and proximal sigmoid sinus on the left, which remain patent CT chest notable for stable L perihilar opacity suggestive of post treatment changes Patient had cancer recently resected from the left ankle.  Oncology was notified of admit, recs for outpt f/u Neurosurgery consulted. No plans for surgery. Recs for decadron  2g bid -Plan Rad Onc, plan CT stimulation tomorrow AM   Colitis Had been on liquid diet, tolerating. Pt agreeable to advance diet today -cont on cefepime per below  CKD 3A Mild hyperkalemia resolved: Cr stable    UTI Leukocytosis with neutrophilia: Unlikely to be infection related.  UA with pyuria more than 50 leukocyte large nitrate  positive concerning for UTI On cefepime based upon previous Pseudomonas infection in June 2025    History of lung cancer.  Now finding concerning for metastatic lesion, follow-up further scan report    Chronic hypoxic respiratory failure COPD: on 3 L  [place] Continue inhalers.   Hypothyroidism. Continue Synthroid    Anemia of chronic disease stage IIIb: Hgb down to 7.9 Check hemoccult stool Recheck cbc in AM Will check iron , folate, b12 level   Essential hypertension: Blood pressure fairly controlled continue home metoprolol . Hold off with lisinopril    Subjective: Tolerating liquid diet, feels ready to advance diet today  Physical Exam: Vitals:   06/08/24 0753 06/08/24 0903 06/08/24 1201 06/08/24 1518  BP:  (!) 119/40 (!) 126/49 (!) 128/51  Pulse: 67 65 70 71  Resp: 18  18 16   Temp:   98.4 F (36.9 C) 98.5 F (36.9 C)  TempSrc:   Oral Oral  SpO2: 98% 94% 97% 99%  Weight:      Height:       General exam: Awake, laying in bed, in nad Respiratory system: Normal respiratory effort, no wheezing Cardiovascular system: regular rate, s1, s2 Gastrointestinal system: Soft, nondistended, positive BS Central nervous system: CN2-12 grossly intact, strength intact Extremities: Perfused, no clubbing Skin: Normal skin turgor, no notable skin lesions seen Psychiatry: Mood normal // no visual hallucinations   Data Reviewed:  Labs reviewed: Na 133, K 4.4, Cr 1.63, WBC 13.2, Hgb 7.9, Plts 190  Family Communication: Pt in room, family not at bedside  Disposition: Status is: Inpatient Remains inpatient appropriate because: severity of illness  Planned Discharge Destination: Home    Author:  Garnette Pelt, MD 06/08/2024 4:59 PM  For on call review [email or web address].

## 2024-06-08 NOTE — Progress Notes (Signed)
 Dressing to LEFT lower leg changed. Purulent drainage noted. Erythema present to surrounding skin. Wounds cleansed with saline. Vaseline gauze applied to wound bed. Covered with dry gauze and foam dressing. New dressing CDI. +1 pitting edema noted to LLE.

## 2024-06-08 NOTE — Progress Notes (Signed)
 Patient ID: Abigail [name], female   DOB: [birth year], 88 y.o.   MRN: [id number] Discussions with radiation oncology noted I believe he will be best to plan SRS to this lesion which can be arranged on an outpatient basis.  Will hold short of any surgical intervention given her multiple medical issues.  She would likely feel better with some low-dose Decadron  Sey 2 mg twice daily and the lead up to radiation oncology and this can then be tapered.  I discussed this with her today she is eager to avoid surgical intervention which is completely reasonable.  I will sign off at this time until outpatient SRS treatment.

## 2024-06-08 NOTE — Plan of Care (Signed)

## 2024-06-08 NOTE — Progress Notes (Addendum)
 Considering Abigail [name]'s overall health the recommendation is to proceed with fractionated radiosurgery without surgical resection. Dr. Izell and Dr. Colon are in agreement with this plan. We will plan for simulation/treatment planning on Thursday 10/16 and start treatment next week, Tuesday 10/21.  Devere Perch R.T(R)(T) Radiation Special Procedures Lead

## 2024-06-08 NOTE — Addendum Note (Signed)
 Encounter addended by: Yetta Devere DEL on: 06/08/2024 2:24 PM  Actions taken: Clinical Note Signed

## 2024-06-08 NOTE — Plan of Care (Signed)

## 2024-06-08 NOTE — Addendum Note (Signed)
 Encounter addended by: Izell Domino, MD on: 06/08/2024 6:11 PM  Actions taken: Cosign clinical note with attestation

## 2024-06-08 NOTE — Progress Notes (Signed)
 This chaplain responded to PMT NP-Zowie's consult for spiritual care. The Pt. is awake and beginning to eat lunch at the time of the visit.  The chaplain listened reflectively to how the Pt. reached her decision for radiation, her reliance on faith/prayer, and the importance of family. The chaplain understands the Pt. is adamant about good communication from the medical team and making her own medical decisions.  Advance Directive paperwork was left with the Pt. and education was started as the Pt. started her dessert.  This chaplain is available for F/U spiritual care as needed.  Chaplain Leeroy Hummer [phone]

## 2024-06-09 ENCOUNTER — Ambulatory Visit: Admitting: Radiation Oncology

## 2024-06-09 ENCOUNTER — Telehealth: Payer: Self-pay | Admitting: Internal Medicine

## 2024-06-09 ENCOUNTER — Ambulatory Visit

## 2024-06-09 DIAGNOSIS — C3432 Malignant neoplasm of lower lobe, left bronchus or lung: Secondary | ICD-10-CM | POA: Insufficient documentation

## 2024-06-09 DIAGNOSIS — Z510 Encounter for antineoplastic radiation therapy: Secondary | ICD-10-CM | POA: Insufficient documentation

## 2024-06-09 DIAGNOSIS — Z85828 Personal history of other malignant neoplasm of skin: Secondary | ICD-10-CM | POA: Insufficient documentation

## 2024-06-09 DIAGNOSIS — R112 Nausea with vomiting, unspecified: Secondary | ICD-10-CM | POA: Diagnosis not present

## 2024-06-09 DIAGNOSIS — C7931 Secondary malignant neoplasm of brain: Secondary | ICD-10-CM | POA: Insufficient documentation

## 2024-06-09 LAB — BASIC METABOLIC PANEL WITH GFR
Anion gap: 7 (ref 5–15)
BUN: 32 mg/dL — ABNORMAL HIGH (ref 8–23)
CO2: 24 mmol/L (ref 22–32)
Calcium: 8.5 mg/dL — ABNORMAL LOW (ref 8.9–10.3)
Chloride: 103 mmol/L (ref 98–111)
Creatinine, Ser: 1.59 mg/dL — ABNORMAL HIGH (ref 0.44–1.00)
GFR, Estimated: 31 mL/min — ABNORMAL LOW
Glucose, Bld: 182 mg/dL — ABNORMAL HIGH (ref 70–99)
Potassium: 5.2 mmol/L — ABNORMAL HIGH (ref 3.5–5.1)
Sodium: 134 mmol/L — ABNORMAL LOW (ref 135–145)

## 2024-06-09 LAB — CBC
HCT: 25.5 % — ABNORMAL LOW (ref 36.0–46.0)
Hemoglobin: 7.9 g/dL — ABNORMAL LOW (ref 12.0–15.0)
MCH: 28.1 pg (ref 26.0–34.0)
MCHC: 31 g/dL (ref 30.0–36.0)
MCV: 90.7 fL (ref 80.0–100.0)
Platelets: 191 K/uL (ref 150–400)
RBC: 2.81 MIL/uL — ABNORMAL LOW (ref 3.87–5.11)
RDW: 15.6 % — ABNORMAL HIGH (ref 11.5–15.5)
WBC: 9.5 K/uL (ref 4.0–10.5)
nRBC: 0 % (ref 0.0–0.2)

## 2024-06-09 MED ORDER — SODIUM ZIRCONIUM CYCLOSILICATE 10 G PO PACK
10.0000 g | PACK | Freq: Once | ORAL | Status: AC
Start: 2024-06-09 — End: 2024-06-09
  Administered 2024-06-09: 10 g via ORAL
  Filled 2024-06-09: qty 1

## 2024-06-09 MED ORDER — AMLODIPINE BESYLATE 5 MG PO TABS
2.5000 mg | ORAL_TABLET | Freq: Every day | ORAL | Status: DC
  Administered 2024-06-09 – 2024-06-10 (×2): 2.5 mg via ORAL
  Filled 2024-06-09 (×3): qty 1

## 2024-06-09 NOTE — Progress Notes (Signed)
 This chaplain is present for F/U spiritual care in the setting of the Pt.'s anticipation of discharge on Friday. The chaplain understands the Pt. is confident she can proceed with radiation from home and also have the companionship of her dog. The Pt. talked about preparation for radiation while admitted, but did not include plans for transportation to radiation upon discharge.  The Pt. is eagerly putting her shoes on to walk with OT. The chaplain offered F/U spiritual care as she exited the room.  Chaplain Leeroy Hummer [phone]

## 2024-06-09 NOTE — Telephone Encounter (Signed)
 I spoke with patient to schedule 4 week lab and follow up appointment with Dr. Sherrod per staff message on 06/09/2024.

## 2024-06-09 NOTE — Progress Notes (Signed)
 PROGRESS NOTE  Abigail [name] Like  DOB: 03/19/1936  PCP: [name], FNP FMW:[id number]  DOA: 06/06/2024  LOS: 3 days  Hospital Day: 4  Subjective: Patient was seen and examined this afternoon. Pleasant elderly Caucasian female.  Sitting up at the edge of the bed.  Low-flow oxygen . Taking her dinner.  Not in distress.  Family not at bedside. Chart reviewed Febrile, heart rate in 60s and 70s, blood pressure in 150s and 160s.  On 3 L oxygen  Repeat labs from this morning potassium elevated to 5.2, creatinine 1.59 hemoglobin 7.9  Brief narrative: Abigail [name] is a 88 y.o. female with PMH significant for HTN, CAD, COPD on 3 L oxygen , h/o lung cancer, CKD, chronic anemia, hypothyroidism, recently had skin cancer resected from left ankle. 10/13, patient was brought to the ED from home with complaint of weakness, dizziness, nausea, vomiting for 1 to 2 hours leading to his near syncopal episode, blurred vision.   CT head showed edema within the left cerebellum with associated mass effect and partial effacement of the fourth ventricle concerning for vasogenic edema in the setting of possible mass lesion.   MRI brain showed 2.3 x 2.0 x 1.9 cm enhancing mass in the lateral aspect of the left cerebellum concerning for metastatic disease. Surrounding vasogenic edema and mass effect on the fourth ventricle. The mass abuts the distal transverse and proximal sigmoid sinus on the left, which remain patent  CT chest did not show any evidence of malignancy, it showed stable L perihilar opacity suggestive of post treatment changes. CT abdomen pelvis did not show any evidence of malignancy either.  It showed mild transverse colitis, large hiatal hernia.  Admitted to [facility] Neurosurgery, medical oncology, radiation oncology and palliative care were consulted  Assessment and plan: Left cerebellar mass Presented with dizziness, nausea, vomiting CT scan and MRI brain findings as above left cerebellar mass  concerning for metastatic disease. CT chest, abdomen and pelvis did not show any evidence of primary malignancy Neurosurgery, medical oncology and radiation oncology were consulted. Noted a plan to proceed with definitive SRS to spare from the risks of craniotomy. Underwent CT simulation radiation today and initiation of radiation treatment early next week 10/21. Currently on Decadron  2 mg twice daily.  Transverse colitis CT abdomen showed mild transverse colitis  Was started on IV cefepime on admission WBC count improving Able to tolerate diet. Stated she had diarrhea initially but has now stopped Recent Labs  Lab 06/06/24 1432 06/07/24 0340 06/08/24 0203 06/09/24 0423  WBC 14.1* 13.4* 13.2* 9.5   UTI Urinalysis with pyuria more than 50 leukocyte large nitrate positive concerning for UTI It seems urine culture was not sent Currently on cefepime based upon previous Pseudomonas infection in June 2025  She states she is chronically on Macrobid  daily for the last several months because of recurrent UTI  Essential hypertension PTA meds-Toprol  XL 50 mg daily, lisinopril  10 mg daily Currently on metoprolol .  Lisinopril  on hold because of hyperkalemia. Blood pressure seems to be uptrending.  Start on amlodipine  2.5 mg daily today  H/o CAD PTA meds- aspirin  81 mg daily, Lipitor  80 mg daily, also on Repatha  every 2 weeks Can resume postdischarge  CKD 4 Cr stable  Recent Labs    02/01/24 0432 02/02/24 0431 02/03/24 0442 02/04/24 0439 02/17/24 1150 04/04/24 1435 06/06/24 1428 06/06/24 1432 06/07/24 0340 06/08/24 0203 06/09/24 0423  BUN 50* 43* 36* 34* 38* 42* 39* 38* 34* 29* 32*  CREATININE 1.67* 1.41* 1.29* 1.39* 1.77*  1.37* 1.50* 1.54* 1.47* 1.63* 1.59*  CO2 24 24 24 23  19* 29  --  23 23 23 24      Hyperkalemia Potassium level tends to run elevated.  5.2 again today.  In the setting of CKD. Lokelma 1 dose ordered Recent Labs  Lab 06/06/24 1428 06/06/24 1432  06/07/24 0340 06/08/24 0203 06/09/24 0423  K 5.2* 5.1 4.4 4.4 5.2*  MG  --   --  1.9  --   --    H/o lung cancer Now finding concerning for metastatic lesion CT chest without any evidence of recurrence To follow-up with oncology   Chronic hypoxic respiratory failure COPD Continue bronchodilator.   Chronically on 3 L oxygen  at home   Hypothyroidism Continue Synthroid    Anemia of chronic disease Baseline hemoglobin above 9.  Hemoglobin down to 7.9 today without active bleeding.   Likely malignancy related.   Continue iron  supplement Continue to monitor  Recent Labs    05/19/24 1512 06/06/24 1428 06/06/24 1432 06/07/24 0340 06/08/24 0203 06/08/24 1835 06/09/24 0423  HGB 9.0* 11.2* 9.9* 8.6* 7.9*  --  7.9*  MCV 93  --  93.0 91.0 91.3  --  90.7  VITAMINB12  --   --   --   --   --  345  --   FOLATE  --   --   --   --   --  11.1  --   TIBC 357  --   --   --   --  333  --   IRON  239*  --   --   --   --  <10*  --       Mobility:  PT Orders: Active   PT Follow up Rec: Home Health Pt10/15/2025 1229   Goals of care   Code Status: Full Code     DVT prophylaxis:  Place and maintain sequential compression device Start: 06/08/24 0459 SCDs Start: 06/06/24 1500   Antimicrobials: Currently on IV cefepime Fluid: None Consultants: Neurosurgery, palliative care, radiation oncology Family Communication: None at bedside  Status: Inpatient Level of care:  Telemetry Medical   Patient is from: Home Needs to continue in-hospital care: Radiation simulation done today Anticipated d/c to: Home with home health      Diet:  Diet Order             DIET SOFT Room service appropriate? Yes; Fluid consistency: Thin  Diet effective now                   Scheduled Meds:  amLODipine   2.5 mg Oral Daily   budesonide -glycopyrrolate -formoterol   2 puff Inhalation BID   dexamethasone   2 mg Oral BID   levothyroxine   175 mcg Oral Q0600   metoprolol  succinate  50 mg Oral  Daily   sodium chloride  flush  3 mL Intravenous Q12H    PRN meds: acetaminophen  **OR** acetaminophen , albuterol , hydrALAZINE , HYDROcodone -acetaminophen , ondansetron  **OR** ondansetron  (ZOFRAN ) IV, polyethylene glycol, prochlorperazine , sodium chloride  flush   Infusions:   ceFEPime (MAXIPIME) IV 2 g (06/09/24 1439)    Antimicrobials: Anti-infectives (From admission, onward)    Start     Dose/Rate Route Frequency Ordered Stop   06/07/24 1215  ceFEPIme (MAXIPIME) 2 g in sodium chloride  0.9 % 100 mL IVPB        2 g 200 mL/hr over 30 Minutes Intravenous Daily 06/07/24 1125         Objective: Vitals:   06/09/24 0843 06/09/24 1620  BP:  ROLLEN)  181/76  Pulse: 75 75  Resp: 18 18  Temp:  98.2 F (36.8 C)  SpO2: 99% 99%    Intake/Output Summary (Last 24 hours) at 06/09/2024 1748 Last data filed at 06/09/2024 9374 Gross per 24 hour  Intake 123 ml  Output 650 ml  Net -527 ml   Filed Weights   06/06/24 1136 06/08/24 0436 06/09/24 0500  Weight: 71.7 kg 75 kg 75.1 kg   Weight change: 0.1 kg Body mass index is 26.72 kg/m.   Physical Exam: General exam: Pleasant, elderly Caucasian female Skin: No rashes, lesions or ulcers. HEENT: Atraumatic, normocephalic, no obvious bleeding Lungs: Clear to auscultation bilaterally,  CVS: S1, S2, no murmur,   GI/Abd: Soft, nontender, nondistended, bowel sound present,   CNS: Alert, awake, oriented x 3 Psychiatry: Mood appropriate Extremities: No pedal edema, no calf tenderness,   Data Review: I have personally reviewed the laboratory data and studies available.  F/u labs ordered Unresulted Labs (From admission, onward)     Start     Ordered   06/08/24 1705  Urine Culture (for pregnant, neutropenic or urologic patients or patients with an indwelling urinary catheter)  (Urine Labs)  Once,   R       Question:  Indication  Answer:  Dysuria   06/08/24 1704   06/08/24 0500  Basic metabolic panel with GFR  Daily,   R      06/07/24 1336    06/08/24 0500  CBC  Daily,   R      06/07/24 1336   06/06/24 1149  CBC with Differential  Once,   STAT        06/06/24 1149   Unscheduled  Occult blood card to lab, stool  As needed,   R      06/08/24 9191            Signed, Chapman Rota, MD [facility] 06/09/2024

## 2024-06-09 NOTE — Plan of Care (Signed)

## 2024-06-09 NOTE — Evaluation (Signed)
 Occupational Therapy Evaluation Patient Details Name: PATTIJO JUSTE MRN: [id number] DOB: [birth year] Today's Date: 06/09/2024   History of Present Illness   Pt is a 88 y.o. female who presented 10/13 due to nausea and vomiting. Pt also noted to reported having a near syncope episode and blurriness of eyes. Per chart CT of the head showed edema within the left cerebellum with associated mass effect and partial effacement of the fourth ventricle concerning for vasogenic edema in the setting of possible mass lesion.  CT scan showed suggestive of colitis. MRI showed enhancing mass in the lateral aspect of the left cerebellum concerning for metastatic disease. PMH: anemia, CKD,  chronic respiratory failure on 3 L of oxygen  at baseline, hypothyroidism, history of lung cancer, recent skin removal of L ankle for skin cancer     Clinical Impressions Pt c/o LLE pain but feeling better than it was. Pt pleasant and conversational. ambulated 150 feet supervision with 3L O2 via [place], O2 remained above 93% throughout session. Pt able to complete ADLs with set up/supervision for safety, increased time for brief rest breaks and Pt telling stories, verbal cueing for redirection to tasks. Pt reports she feels weaker than normal but feels like she would be okay to function if she were to go home soon. Pt would benefit from continued acute OT to maximize activity tolerance and safety with ADLs/mobility, HHOT recommended.      If plan is discharge home, recommend the following:   A little help with bathing/dressing/bathroom;Assistance with cooking/housework     Functional Status Assessment         Equipment Recommendations   BSC/3in1     Recommendations for Other Services         Precautions/Restrictions   Precautions Precautions: Fall Recall of Precautions/Restrictions: Intact Precaution/Restrictions Comments: pt is fearful of falling, on 3L O2 baseline Restrictions Weight Bearing Restrictions  Per Provider Order: No     Mobility Bed Mobility Overal bed mobility: Needs Assistance             General bed mobility comments: sitting EOB    Transfers Overall transfer level: Needs assistance Equipment used: Rolling walker (2 wheels) Transfers: Sit to/from Stand Sit to Stand: Supervision           General transfer comment: supervision for safety with RW      Balance Overall balance assessment: Needs assistance Sitting-balance support: No upper extremity supported, Feet supported Sitting balance-Leahy Scale: Good     Standing balance support: Single extremity supported, During functional activity Standing balance-Leahy Scale: Fair Standing balance comment: able to stand unsupported                           ADL either performed or assessed with clinical judgement   ADL Overall ADL's : Needs assistance/impaired                                       General ADL Comments: overall set up/supervision for safety with RW     Vision         Perception         Praxis         Pertinent Vitals/Pain Pain Assessment Pain Assessment: Faces Faces Pain Scale: Hurts little more Pain Location: LLE from skin sx Pain Descriptors / Indicators: Discomfort, Grimacing, Guarding, Sore Pain Intervention(s): Monitored during session  Extremity/Trunk Assessment             Communication Communication Communication: No apparent difficulties   Cognition Arousal: Alert Behavior During Therapy: WFL for tasks assessed/performed Cognition: No apparent impairments                               Following commands: Intact       Cueing  General Comments   Cueing Techniques: Verbal cues      Exercises     Shoulder Instructions      Home Living                                          Prior Functioning/Environment                      OT Problem List:     OT  Treatment/Interventions:        OT Goals(Current goals can be found in the care plan section)   Acute Rehab OT Goals Patient Stated Goal: to improve strength and endurance OT Goal Formulation: With patient Time For Goal Achievement: 06/21/24 Potential to Achieve Goals: Good ADL Goals Pt Will Perform Upper Body Bathing: with modified independence;sitting Pt Will Perform Lower Body Bathing: with modified independence;sit to/from stand Pt Will Perform Upper Body Dressing: with modified independence;sitting Pt Will Perform Lower Body Dressing: with modified independence;sit to/from stand Pt Will Transfer to Toilet: with modified independence;ambulating   OT Frequency:  Min 2X/week    Co-evaluation              AM-PAC OT 6 Clicks Daily Activity     Outcome Measure Help from another person eating meals?: None Help from another person taking care of personal grooming?: A Little Help from another person toileting, which includes using toliet, bedpan, or urinal?: A Little Help from another person bathing (including washing, rinsing, drying)?: A Little Help from another person to put on and taking off regular upper body clothing?: None Help from another person to put on and taking off regular lower body clothing?: A Little 6 Click Score: 20   End of Session Equipment Utilized During Treatment: Gait belt;Rolling walker (2 wheels) Nurse Communication: Mobility status  Activity Tolerance: Patient tolerated treatment well Patient left: in bed;with call bell/phone within reach;with nursing/sitter in room  OT Visit Diagnosis: Unsteadiness on feet (R26.81);Other abnormalities of gait and mobility (R26.89);Muscle weakness (generalized) (M62.81);Pain Pain - Right/Left: Left Pain - part of body: Ankle and joints of foot                Time: 8451-8381 OT Time Calculation (min): 30 min Charges:  OT General Charges $OT Visit: 1 Visit OT Treatments $Self Care/Home Management : 8-22  mins $Therapeutic Activity: 8-22 mins  Dago Jungwirth, OTR/L   Elouise JONELLE Bott 06/09/2024, 4:24 PM

## 2024-06-09 NOTE — Care Management Important Message (Signed)
 Important Message  Patient Details  Name: Abigail [name] MRN: [id number] Date of Birth: 1936-06-22   Important Message Given:  Yes - Medicare IM     Claretta Deed 06/09/2024, 2:46 PM

## 2024-06-10 ENCOUNTER — Encounter: Payer: Self-pay | Admitting: Physician Assistant

## 2024-06-10 ENCOUNTER — Ambulatory Visit: Admitting: Radiation Oncology

## 2024-06-10 ENCOUNTER — Other Ambulatory Visit (HOSPITAL_COMMUNITY): Payer: Self-pay

## 2024-06-10 ENCOUNTER — Encounter: Payer: Self-pay | Admitting: Internal Medicine

## 2024-06-10 LAB — CBC
HCT: 23.8 % — ABNORMAL LOW (ref 36.0–46.0)
Hemoglobin: 7.4 g/dL — ABNORMAL LOW (ref 12.0–15.0)
MCH: 27.8 pg (ref 26.0–34.0)
MCHC: 31.1 g/dL (ref 30.0–36.0)
MCV: 89.5 fL (ref 80.0–100.0)
Platelets: 230 K/uL (ref 150–400)
RBC: 2.66 MIL/uL — ABNORMAL LOW (ref 3.87–5.11)
RDW: 15.9 % — ABNORMAL HIGH (ref 11.5–15.5)
WBC: 10.7 K/uL — ABNORMAL HIGH (ref 4.0–10.5)
nRBC: 0 % (ref 0.0–0.2)

## 2024-06-10 LAB — BASIC METABOLIC PANEL WITH GFR
Anion gap: 10 (ref 5–15)
BUN: 40 mg/dL — ABNORMAL HIGH (ref 8–23)
CO2: 24 mmol/L (ref 22–32)
Calcium: 8.5 mg/dL — ABNORMAL LOW (ref 8.9–10.3)
Chloride: 100 mmol/L (ref 98–111)
Creatinine, Ser: 1.65 mg/dL — ABNORMAL HIGH (ref 0.44–1.00)
GFR, Estimated: 30 mL/min — ABNORMAL LOW (ref >60)
Glucose, Bld: 175 mg/dL — ABNORMAL HIGH (ref 70–99)
Potassium: 5 mmol/L (ref 3.5–5.1)
Sodium: 134 mmol/L — ABNORMAL LOW (ref 135–145)

## 2024-06-10 MED ORDER — METRONIDAZOLE 500 MG PO TABS: 500.0000 mg | ORAL_TABLET | Freq: Two times a day (BID) | ORAL | Status: DC

## 2024-06-10 MED ORDER — DEXAMETHASONE 2 MG PO TABS
2.0000 mg | ORAL_TABLET | Freq: Two times a day (BID) | ORAL | 0 refills | Status: DC
  Filled 2024-06-10: qty 30, 15d supply, fill #0

## 2024-06-10 MED ORDER — CEFDINIR 300 MG PO CAPS
300.0000 mg | ORAL_CAPSULE | Freq: Two times a day (BID) | ORAL | 0 refills | Status: AC
  Filled 2024-06-10: qty 10, 5d supply, fill #0

## 2024-06-10 MED ORDER — ENSURE PLUS HIGH PROTEIN PO LIQD: 237.0000 mL | Freq: Two times a day (BID) | ORAL | Status: DC

## 2024-06-10 MED ORDER — ENSURE PLUS HIGH PROTEIN PO LIQD
237.0000 mL | Freq: Two times a day (BID) | ORAL | Status: DC
  Administered 2024-06-10: 237 mL via ORAL

## 2024-06-10 MED ORDER — POLYETHYLENE GLYCOL 3350 17 GM/SCOOP PO POWD
17.0000 g | Freq: Every day | ORAL | 0 refills | Status: AC | PRN
  Filled 2024-06-10: qty 238, 14d supply, fill #0

## 2024-06-10 MED ORDER — HYDROCODONE-ACETAMINOPHEN 5-325 MG PO TABS
1.0000 | ORAL_TABLET | Freq: Four times a day (QID) | ORAL | 0 refills | Status: DC | PRN
  Filled 2024-06-10: qty 20, 3d supply, fill #0

## 2024-06-10 NOTE — Discharge Summary (Signed)
 Physician Discharge Summary  Abigail [name] FMW:[id number] DOB: 1935/12/01 DOA: 06/06/2024  PCP: [name], FNP  Admit date: 06/06/2024 Discharge date: 06/10/2024  Admitted from: Home Discharge disposition: Home with home health  Recommendations at discharge:  You have been started on Decadron  because of the brain mass. Recommended to continue Decadron  2 mg twice daily for now to be adjusted by Dr. Izell on follow-up. Complete the course of antibiotics with Omnicef and Flagyl for next 5 days. For blood pressure control, lisinopril  has been stopped.  Continue metoprolol  as before Continue Macrobid  for UTI as before.  Subjective: Patient was seen and examined this morning Sitting up at the edge of the bed.  Not in distress. Remains afebrile, hemodynamically stable Labs this morning with potassium better at 5, sodium 134, BUN/creatinine at baseline  Brief narrative: Abigail [name] is a 88 y.o. female with PMH significant for HTN, CAD, COPD on 3 L oxygen , h/o lung cancer, CKD, chronic anemia, hypothyroidism, recently had skin cancer resected from left ankle. 10/13, patient was brought to the ED from home with complaint of weakness, dizziness, nausea, vomiting for 1 to 2 hours leading to his near syncopal episode, blurred vision.   CT head showed edema within the left cerebellum with associated mass effect and partial effacement of the fourth ventricle concerning for vasogenic edema in the setting of possible mass lesion.   MRI brain showed 2.3 x 2.0 x 1.9 cm enhancing mass in the lateral aspect of the left cerebellum concerning for metastatic disease. Surrounding vasogenic edema and mass effect on the fourth ventricle. The mass abuts the distal transverse and proximal sigmoid sinus on the left, which remain patent  CT chest did not show any evidence of malignancy, it showed stable L perihilar opacity suggestive of post treatment changes. CT abdomen pelvis did not show any evidence of  malignancy either.  It showed mild transverse colitis, large hiatal hernia.  Admitted to [facility] Neurosurgery, medical oncology, radiation oncology and palliative care were consulted 10/16, underwent CT simulation followed to plan of radiation to start next week  Hospital course: Left cerebellar mass Presented with dizziness, nausea, vomiting CT scan and MRI brain findings as above left cerebellar mass concerning for metastatic disease. CT chest, abdomen and pelvis did not show any evidence of primary malignancy Neurosurgery, medical oncology and radiation oncology were consulted. Noted a plan to proceed with definitive SRS to spare from the risks of craniotomy. 10/16, underwent CT simulation radiation and initiation of radiation treatment early next week 10/21. Currently on Decadron  2 mg twice daily. I d/w radiation oncology Dr. Dewey this morning.  Recommended to continue Decadron  2 mg twice daily for now to be adjusted by Dr. Izell on follow-up.  Transverse colitis CT abdomen showed mild transverse colitis  Was started on IV cefepime on admission WBC count improving Able to tolerate diet. Stated she had diarrhea initially but has now stopped. Given a course of IV cefepime in the hospital.  Since she is being initiated on Decadron  for her brain mass, I would continue antibiotic coverage with Omnicef and Flagyl for next 5 days. Recent Labs  Lab 06/06/24 1432 06/07/24 0340 06/08/24 0203 06/09/24 0423 06/10/24 0159  WBC 14.1* 13.4* 13.2* 9.5 10.7*   Chronic UTI Urinalysis with pyuria more than 50 leukocyte large nitrate positive concerning for UTI It seems urine culture was not sent Currently on cefepime based upon previous Pseudomonas infection in June 2025.  She states she is chronically on Macrobid  daily for  the last several months because of recurrent UTI. Postdischarge, I would recommend to continue Macrobid  as before her stop  Essential hypertension PTA meds-Toprol  XL 50 mg  daily, lisinopril  10 mg daily Lisinopril  was held because of hyperkalemia. Toprol  continued.    H/o CAD PTA meds- aspirin  81 mg daily, Lipitor  80 mg daily, also on Repatha  every 2 weeks Can resume postdischarge  CKD 4 Cr stable  Recent Labs    02/02/24 0431 02/03/24 0442 02/04/24 0439 02/17/24 1150 04/04/24 1435 06/06/24 1428 06/06/24 1432 06/07/24 0340 06/08/24 0203 06/09/24 0423 06/10/24 0159  BUN 43* 36* 34* 38* 42* 39* 38* 34* 29* 32* 40*  CREATININE 1.41* 1.29* 1.39* 1.77* 1.37* 1.50* 1.54* 1.47* 1.63* 1.59* 1.65*  CO2 24 24 23  19* 29  --  23 23 23 24 24      Hyperkalemia Potassium level improved after 1 dose of Lokelma yesterday. Recent Labs  Lab 06/06/24 1432 06/07/24 0340 06/08/24 0203 06/09/24 0423 06/10/24 0159  K 5.1 4.4 4.4 5.2* 5.0  MG  --  1.9  --   --   --    H/o lung cancer Now finding concerning for metastatic lesion CT chest without any evidence of recurrence To follow-up with oncology   Chronic hypoxic respiratory failure COPD Continue bronchodilator.   Chronically on 3 L oxygen  at home   Hypothyroidism Continue Synthroid    Anemia of chronic disease Baseline hemoglobin above 9.  Hemoglobin down between 7 and 8 currently without any evidence of bleeding. Likely malignancy related.   Continue iron  supplement Continue to monitor  Recent Labs    05/19/24 1512 06/06/24 1428 06/06/24 1432 06/07/24 0340 06/08/24 0203 06/08/24 1835 06/09/24 0423 06/10/24 0159  HGB 9.0*   < > 9.9* 8.6* 7.9*  --  7.9* 7.4*  MCV 93  --  93.0 91.0 91.3  --  90.7 89.5  VITAMINB12  --   --   --   --   --  345  --   --   FOLATE  --   --   --   --   --  11.1  --   --   TIBC 357  --   --   --   --  333  --   --   IRON  239*  --   --   --   --  <10*  --   --    < > = values in this interval not displayed.   Impaired mobility PT eval obtained.  Home with PT recommended  Goals of care   Code Status: Full Code   Diet:  Diet Order             Diet  general           DIET SOFT Room service appropriate? Yes; Fluid consistency: Thin  Diet effective now                   Nutritional status:  Body mass index is 26.37 kg/m.       Wounds:  - Wound 06/06/24 1859 Surgical Pretibial Left (Active)  Date First Assessed/Time First Assessed: 06/06/24 1859   Present on Original Admission: Yes  Primary Wound Type: Surgical  Location: Pretibial  Location Orientation: Left    No assessment data to display     No associated orders.     Wound (Active)  No Date First Assessed or Time First Assessed found.      Assessments 06/08/2024  7:53 AM  Wound Image       No associated orders.    Discharge Medications:   Allergies as of 06/10/2024       Reactions   Codeine Nausea And Vomiting   Norvasc  [amlodipine ] Rash, Other (See Comments)   rash        Medication List     STOP taking these medications    lisinopril  10 MG tablet Commonly known as: ZESTRIL        TAKE these medications    acetaminophen  500 MG tablet Commonly known as: TYLENOL  Take 500 mg by mouth every 6 (six) hours as needed (for leg pain).   albuterol  108 (90 Base) MCG/ACT inhaler Commonly known as: VENTOLIN  HFA TAKE 2 PUFFS BY MOUTH EVERY 6 HOURS AS NEEDED FOR WHEEZE OR SHORTNESS OF BREATH   aspirin  EC 81 MG tablet Take 81 mg by mouth at bedtime.   atorvastatin  80 MG tablet Commonly known as: LIPITOR  Take 80 mg by mouth daily as needed (for cholesterol).   Breztri  Aerosphere 160-9-4.8 MCG/ACT Aero inhaler Generic drug: budesonide -glycopyrrolate -formoterol  INHALE 2 PUFFS INTO THE LUNGS IN THE MORNING AND ALSO 2 PUFFS AT BEDTIME What changed: See the new instructions.   cefdinir 300 MG capsule Commonly known as: OMNICEF Take 1 capsule (300 mg total) by mouth 2 (two) times daily for 5 days.   dexamethasone  2 MG tablet Commonly known as: DECADRON  Take 1 tablet (2 mg total) by mouth 2 (two) times daily.   estradiol  0.1 MG/GM vaginal  cream Commonly known as: ESTRACE  Place 0.5g twice a week at opening of vagina   feeding supplement Liqd Take 237 mLs by mouth 2 (two) times daily between meals.   ferrous sulfate  325 (65 FE) MG EC tablet TAKE 1 TABLET BY MOUTH EVERY DAY WITH BREAKFAST   HYDROcodone -acetaminophen  5-325 MG tablet Commonly known as: NORCO/VICODIN Take 1-2 tablets by mouth every 6 (six) hours as needed for moderate pain (pain score 4-6).   levothyroxine  175 MCG tablet Commonly known as: SYNTHROID  TAKE 1 TABLET BY MOUTH EVERY MORNING ON MONDAY - FRIDAY What changed: See the new instructions.   metoprolol  succinate 50 MG 24 hr tablet Commonly known as: TOPROL -XL Take 50 mg by mouth every evening.   metroNIDAZOLE 500 MG tablet Commonly known as: FLAGYL Take 1 tablet (500 mg total) by mouth 2 (two) times daily for 5 days.   nitrofurantoin  100 MG capsule Commonly known as: MACRODANTIN  Take 100 mg by mouth daily.   nitroGLYCERIN  0.4 MG SL tablet Commonly known as: NITROSTAT  PLACE 1 TABLET UNDER THE TONGUE EVERY 5 (FIVE) MINUTES X 3 DOSES AS NEEDED FOR CHEST PAIN.   OXYGEN  Inhale 3 L into the lungs continuous.   polyethylene glycol powder 17 GM/SCOOP powder Commonly known as: GLYCOLAX /MIRALAX  Dissolve 1 capful (17g) in 4-8 ounces of liquid and take by mouth daily as needed for mild constipation.   Repatha  SureClick 140 MG/ML Soaj Generic drug: Evolocumab  Inject 140 mg into the skin every 14 (fourteen) days.   trospium  20 MG tablet Commonly known as: SANCTURA  Take 1 tablet (20 mg total) by mouth 2 (two) times daily.               Discharge Care Instructions  (From admission, onward)           Start     Ordered   06/10/24 0000  Discharge wound care:        06/10/24 1008             Follow  ups:    Follow-up Information     [facility] Follow up.   Specialty: Home Health Services Why: [facility] will provide home health services.  They will call  you in the next 24-48 hours to set up services. Contact information: [address] [place] [address] [phone]         [name], FNP Follow up.   Specialty: General Practice Contact information: [address] [place] [address] [phone]                 Discharge Instructions:   Discharge Instructions     Call MD for:  difficulty breathing, headache or visual disturbances   Complete by: As directed    Call MD for:  extreme fatigue   Complete by: As directed    Call MD for:  hives   Complete by: As directed    Call MD for:  persistant dizziness or light-headedness   Complete by: As directed    Call MD for:  persistant nausea and vomiting   Complete by: As directed    Call MD for:  severe uncontrolled pain   Complete by: As directed    Call MD for:  temperature >100.4   Complete by: As directed    Diet general   Complete by: As directed    Discharge instructions   Complete by: As directed    Recommendations at discharge:   You have been started on Decadron  because of the brain mass. Recommended to continue Decadron  2 mg twice daily for now to be adjusted by Dr. Izell on follow-up.  Complete the course of antibiotics with Omnicef and Flagyl for next 5 days.  For blood pressure control, lisinopril  has been stopped.  Continue metoprolol  as before  Continue Macrobid  for UTI as before.  PDMP reviewed this encounter.   Opioid taper instructions: It is important to wean off of your opioid medication as soon as possible. If you do not need pain medication after your surgery it is ok to stop day one. Opioids include: Codeine, Hydrocodone (Norco, Vicodin), Oxycodone (Percocet, oxycontin ) and hydromorphone amongst others.  Long term and even short term use of opiods can cause: Increased pain response Dependence Constipation Depression Respiratory depression And more.  Withdrawal symptoms can include Flu like  symptoms Nausea, vomiting And more Techniques to manage these symptoms Hydrate well Eat regular healthy meals Stay active Use relaxation techniques(deep breathing, meditating, yoga) Do Not substitute Alcohol to help with tapering If you have been on opioids for less than two weeks and do not have pain than it is ok to stop all together.  Plan to wean off of opioids This plan should start within one week post op of your joint replacement. Maintain the same interval or time between taking each dose and first decrease the dose.  Cut the total daily intake of opioids by one tablet each day Next start to increase the time between doses. The last dose that should be eliminated is the evening dose.        General discharge instructions: Follow with Primary MD [name], FNP in 7 days  Please request your PCP  to go over your hospital tests, procedures, radiology results at the follow up. Please get your medicines reviewed and adjusted.  Your PCP may decide to repeat certain labs or tests as needed. Do not drive, operate heavy machinery, perform activities at heights, swimming or participation in water activities or provide baby sitting  services if your were admitted for syncope or siezures until you have seen by Primary MD or a Neurologist and advised to do so again. [place]  Controlled Substance Reporting System database was reviewed. Do not drive, operate heavy machinery, perform activities at heights, swim, participate in water activities or provide baby-sitting services while on medications for pain, sleep and mood until your outpatient physician has reevaluated you and advised to do so again.  You are strongly recommended to comply with the dose, frequency and duration of prescribed medications. Activity: As tolerated with Full fall precautions use walker/cane & assistance as needed Avoid using any recreational substances like cigarette, tobacco, alcohol, or non-prescribed  drug. If you experience worsening of your admission symptoms, develop shortness of breath, life threatening emergency, suicidal or homicidal thoughts you must seek medical attention immediately by calling 911 or calling your MD immediately  if symptoms less severe. You must read complete instructions/literature along with all the possible adverse reactions/side effects for all the medicines you take and that have been prescribed to you. Take any new medicine only after you have completely understood and accepted all the possible adverse reactions/side effects.  Wear Seat belts while driving. You were cared for by a hospitalist during your hospital stay. If you have any questions about your discharge medications or the care you received while you were in the hospital after you are discharged, you can call the unit and ask to speak with the hospitalist or the covering physician. Once you are discharged, your primary care physician will handle any further medical issues. Please note that NO REFILLS for any discharge medications will be authorized once you are discharged, as it is imperative that you return to your primary care physician (or establish a relationship with a primary care physician if you do not have one).   Discharge wound care:   Complete by: As directed    Increase activity slowly   Complete by: As directed        Discharge Exam:   Vitals:   06/10/24 0500 06/10/24 0812 06/10/24 0901 06/10/24 1151  BP: (!) 123/43 (!) 148/60  (!) 155/60  Pulse: (!) 56 (!) 59 60 63  Resp: 18 20 20 18   Temp: 97.9 F (36.6 C) 98.2 F (36.8 C)  98.1 F (36.7 C)  TempSrc: Oral     SpO2: 98% 94% 94% 100%  Weight: 74.1 kg     Height:        Body mass index is 26.37 kg/m.   General exam: Pleasant, elderly Caucasian female Skin: No rashes, lesions or ulcers. HEENT: Atraumatic, normocephalic, no obvious bleeding Lungs: Clear to auscultation bilaterally,  CVS: S1, S2, no murmur,   GI/Abd: Soft,  nontender, nondistended, bowel sound present,   CNS: Alert, awake, oriented x 3 Psychiatry: Mood appropriate Extremities: No pedal edema, no calf tenderness,    The results of significant diagnostics from this hospitalization (including imaging, microbiology, ancillary and laboratory) are listed below for reference.    Procedures and Diagnostic Studies:   MR BRAIN W WO CONTRAST Result Date: 06/08/2024 EXAM: MRI BRAIN WITH AND WITHOUT CONTRAST 06/08/2024 12:43:03 AM TECHNIQUE: Multiplanar multisequence MRI of the head/brain was performed with and without the administration of 7 mL gadobutrol  (GADAVIST ) 1 MMOL/ML injection. COMPARISON: MRI of the head dated 06/06/2024. CLINICAL HISTORY: Metastatic disease evaluation; 3T MRI SRS protocol. FINDINGS: BRAIN AND VENTRICLES: No acute infarct. No acute intracranial hemorrhage. No midline shift. No hydrocephalus. The sella is unremarkable. Normal flow voids. An ovoid,  avidly enhancing mass is again seen laterally within the left cerebellar hemisphere measuring 2.3 x 2.1 x 2.0 cm, essentially unchanged in the interim. There is moderate adjacent cerebellar swelling with mild mass effect upon the fourth ventricle, as before. There is some blooming artifact along the periphery of the mass. There are no additional lesions evident. There is moderate diffuse cerebral white matter disease, as before. No abnormal enhancement beyond the described mass. ORBITS: No acute abnormality. SINUSES: There is mild polypoid mucosal disease present within the left sphenoid recess. BONES AND SOFT TISSUES: Normal bone marrow signal and enhancement. No acute soft tissue abnormality. IMPRESSION: 1. Ovoid, avidly enhancing mass in the left cerebellar hemisphere measuring 2.3 x 2.1 x 2.0 cm, essentially unchanged, with moderate adjacent cerebellar swelling and mild mass effect upon the fourth ventricle. 2. No additional lesions evident. 3. Moderate diffuse cerebral white matter disease, as  before. Electronically signed by: Evalene Coho MD 06/08/2024 05:00 AM EDT RP Workstation: [id number]   CT CHEST WO CONTRAST Result Date: 06/07/2024 CLINICAL DATA:  Lung cancer.  *Tracking code: BO* EXAM: CT CHEST WITHOUT CONTRAST TECHNIQUE: Multidetector CT imaging of the chest was performed following the standard protocol without IV contrast. RADIATION DOSE REDUCTION: This exam was performed according to the departmental dose-optimization program which includes automated exposure control, adjustment of the mA and/or kV according to patient size and/or use of iterative reconstruction technique. COMPARISON:  04/04/2024 FINDINGS: Cardiovascular: Heart is normal size. Aorta is normal caliber. Diffuse coronary artery and aortic atherosclerosis. No evidence of aortic aneurysm. Mediastinum/Nodes: No mediastinal or axillary adenopathy. Evaluation of the hila limited without IV contrast. Trachea and esophagus are unremarkable. Thyroid  unremarkable. Moderate-sized hiatal hernia, stable. Lungs/Pleura: Centrilobular emphysema. Again noted is the left perihilar opacity with associated linear band like areas of extension into the left upper lobe and superior segment of the left lower lobe. These findings are unchanged since prior study and likely reflect post treatment changes. Small left pleural effusion has slightly increased in size since prior study. Compressive atelectasis in the left lower lobe. Upper Abdomen: No acute findings Musculoskeletal: Chest wall soft tissues are unremarkable. No acute bony abnormality. No suspicious osseous lesion. Diffuse osteopenia and degenerative changes. IMPRESSION: Stable left perihilar opacity with bandlike soft tissue extending into the left upper lobe and superior segment of the left lower lobe. This is stable since prior study and likely reflects post treatment changes. Small left pleural effusion, slightly increased in size since prior study. Diffuse coronary artery disease.  Aortic Atherosclerosis (ICD10-I70.0) and Emphysema (ICD10-J43.9). Electronically Signed   By: Franky Crease M.D.   On: 06/07/2024 20:38     Labs:   Basic Metabolic Panel: Recent Labs  Lab 06/06/24 1432 06/07/24 0340 06/08/24 0203 06/09/24 0423 06/10/24 0159  NA 134* 134* 133* 134* 134*  K 5.1 4.4 4.4 5.2* 5.0  CL 103 101 100 103 100  CO2 23 23 23 24 24   GLUCOSE 150* 172* 129* 182* 175*  BUN 38* 34* 29* 32* 40*  CREATININE 1.54* 1.47* 1.63* 1.59* 1.65*  CALCIUM  9.0 8.7* 8.6* 8.5* 8.5*  MG  --  1.9  --   --   --    GFR Estimated Creatinine Clearance: 25.2 mL/min (A) (by C-G formula based on SCr of 1.65 mg/dL (H)). Liver Function Tests: Recent Labs  Lab 06/06/24 1432 06/07/24 0340  AST 19 16  ALT 14 12  ALKPHOS 116 100  BILITOT 0.3 0.6  PROT 7.8 6.5  ALBUMIN 3.8 3.1*   Recent  Labs  Lab 06/06/24 1432  LIPASE 29   No results for input(s): AMMONIA in the last 168 hours. Coagulation profile No results for input(s): INR, PROTIME in the last 168 hours.  CBC: Recent Labs  Lab 06/06/24 1432 06/07/24 0340 06/08/24 0203 06/09/24 0423 06/10/24 0159  WBC 14.1* 13.4* 13.2* 9.5 10.7*  NEUTROABS 12.9*  --   --   --   --   HGB 9.9* 8.6* 7.9* 7.9* 7.4*  HCT 33.4* 28.3* 26.4* 25.5* 23.8*  MCV 93.0 91.0 91.3 90.7 89.5  PLT 233 196 190 191 230   Cardiac Enzymes: No results for input(s): CKTOTAL, CKMB, CKMBINDEX, TROPONINI in the last 168 hours. BNP: Invalid input(s): POCBNP CBG: No results for input(s): GLUCAP in the last 168 hours. D-Dimer No results for input(s): DDIMER in the last 72 hours. Hgb A1c No results for input(s): HGBA1C in the last 72 hours. Lipid Profile No results for input(s): CHOL, HDL, LDLCALC, TRIG, CHOLHDL, LDLDIRECT in the last 72 hours. Thyroid  function studies No results for input(s): TSH, T4TOTAL, T3FREE, THYROIDAB in the last 72 hours.  Invalid input(s): FREET3 Anemia work up Recent Labs     06/08/24 1835  VITAMINB12 345  FOLATE 11.1  TIBC 333  IRON  <10*   Microbiology No results found for this or any previous visit (from the past 240 hours).  Time coordinating discharge: 45 minutes  Signed: Hermenegildo Clausen  [facility] 06/10/2024, 3:29 PM

## 2024-06-10 NOTE — Plan of Care (Signed)
  Problem: Education: Goal: Knowledge of General Education information will improve Description: Including pain rating scale, medication(s)/side effects and non-pharmacologic comfort measures 06/10/2024 1157 by Joshua Zorita CROME, RN Outcome: Adequate for Discharge 06/10/2024 1156 by Joshua Zorita CROME, RN Outcome: Adequate for Discharge   Problem: Health Behavior/Discharge Planning: Goal: Ability to manage health-related needs will improve 06/10/2024 1157 by Joshua Zorita CROME, RN Outcome: Adequate for Discharge 06/10/2024 1156 by Joshua Zorita CROME, RN Outcome: Adequate for Discharge   Problem: Clinical Measurements: Goal: Ability to maintain clinical measurements within normal limits will improve 06/10/2024 1157 by Joshua Zorita CROME, RN Outcome: Adequate for Discharge 06/10/2024 1156 by Joshua Zorita CROME, RN Outcome: Adequate for Discharge Goal: Will remain free from infection 06/10/2024 1157 by Joshua Zorita CROME, RN Outcome: Adequate for Discharge 06/10/2024 1156 by Joshua Zorita CROME, RN Outcome: Adequate for Discharge Goal: Diagnostic test results will improve 06/10/2024 1157 by Joshua Zorita CROME, RN Outcome: Adequate for Discharge 06/10/2024 1156 by Joshua Zorita CROME, RN Outcome: Adequate for Discharge Goal: Respiratory complications will improve 06/10/2024 1157 by Joshua Zorita CROME, RN Outcome: Adequate for Discharge 06/10/2024 1156 by Joshua Zorita CROME, RN Outcome: Adequate for Discharge Goal: Cardiovascular complication will be avoided 06/10/2024 1157 by Joshua Zorita CROME, RN Outcome: Adequate for Discharge 06/10/2024 1156 by Joshua Zorita CROME, RN Outcome: Adequate for Discharge   Problem: Activity: Goal: Risk for activity intolerance will decrease 06/10/2024 1157 by Joshua Zorita CROME, RN Outcome: Adequate for Discharge 06/10/2024 1156 by Joshua Zorita CROME, RN Outcome: Adequate for Discharge   Problem: Nutrition: Goal: Adequate nutrition will be maintained 06/10/2024 1157 by Joshua Zorita CROME, RN Outcome:  Adequate for Discharge 06/10/2024 1156 by Joshua Zorita CROME, RN Outcome: Adequate for Discharge   Problem: Coping: Goal: Level of anxiety will decrease 06/10/2024 1157 by Joshua Zorita CROME, RN Outcome: Adequate for Discharge 06/10/2024 1156 by Joshua Zorita CROME, RN Outcome: Adequate for Discharge   Problem: Elimination: Goal: Will not experience complications related to bowel motility 06/10/2024 1157 by Joshua Zorita CROME, RN Outcome: Adequate for Discharge 06/10/2024 1156 by Joshua Zorita CROME, RN Outcome: Adequate for Discharge Goal: Will not experience complications related to urinary retention 06/10/2024 1157 by Joshua Zorita CROME, RN Outcome: Adequate for Discharge 06/10/2024 1156 by Joshua Zorita CROME, RN Outcome: Adequate for Discharge   Problem: Pain Managment: Goal: General experience of comfort will improve and/or be controlled 06/10/2024 1157 by Joshua Zorita CROME, RN Outcome: Adequate for Discharge 06/10/2024 1156 by Joshua Zorita CROME, RN Outcome: Adequate for Discharge   Problem: Safety: Goal: Ability to remain free from injury will improve 06/10/2024 1157 by Joshua Zorita CROME, RN Outcome: Adequate for Discharge 06/10/2024 1156 by Joshua Zorita CROME, RN Outcome: Adequate for Discharge   Problem: Skin Integrity: Goal: Risk for impaired skin integrity will decrease 06/10/2024 1157 by Joshua Zorita CROME, RN Outcome: Adequate for Discharge 06/10/2024 1156 by Joshua Zorita CROME, RN Outcome: Adequate for Discharge

## 2024-06-10 NOTE — Congregational Nurse Program (Signed)
 Dressing to LEFT lower leg changed. Wound cleansed with saline. Vaseline gauze placed to wound bed.  Erythema noted to surrounding skin. Foam dressing placed over vaseline gauze. New dressing CDI.

## 2024-06-10 NOTE — Plan of Care (Signed)

## 2024-06-10 NOTE — TOC Transition Note (Signed)
 Transition of Care [facility]) - Discharge Note   Patient Details  Name: Abigail [name] MRN: [id number] Date of Birth: 09/19/1935  Transition of [facility]) CM/SW Contact:  Tom-Johnson, Harvest Muskrat, RN Phone Number: 06/10/2024, 11:57 AM   Clinical Narrative:     Patient is scheduled for discharge today.  Readmission Risk Assessment done. Home health info, outpatient f/u, hospital f/u and discharge instructions on AVS. Prescriptions sent to [facility] pharmacy and patient will receive meds prior discharge. 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 Unable to get in the house via cab.  PTAR scheduled to transport at discharge.  No further ICM needs noted.      Final next level of care: Home w Home Health Services Barriers to Discharge: Barriers Resolved   Patient Goals and CMS Choice Patient states their goals for this hospitalization and ongoing recovery are:: To return home CMS Medicare.gov Compare Post Acute Care list provided to:: Patient Choice offered to / list presented to : Patient      Discharge Placement                Patient to be transferred to facility by: PTAR      Discharge Plan and Services Additional resources added to the After Visit Summary for                                       Social Drivers of Health (SDOH) Interventions SDOH Screenings   Food Insecurity: No Food Insecurity (06/07/2024)  Housing: Low Risk  (06/07/2024)  Transportation Needs: No Transportation Needs (06/07/2024)  Utilities: Not At Risk (06/07/2024)  Alcohol Screen: Low Risk  (03/09/2024)  Depression (PHQ2-9): Low Risk  (03/09/2024)  Financial Resource Strain: Medium Risk (03/09/2024)  Physical Activity: Inactive (03/09/2024)  Social Connections: Moderately Isolated (06/07/2024)  Stress: No Stress Concern Present (03/09/2024)  Recent Concern: Stress - Stress Concern Present (03/09/2024)  Tobacco  Use: Medium Risk (06/07/2024)  Health Literacy: Adequate Health Literacy (03/09/2024)     Readmission Risk Interventions    06/07/2024    3:57 PM  Readmission Risk Prevention Plan  Transportation Screening Complete  PCP or Specialist Appt within 3-5 Days Complete  HRI or Home Care Consult Complete  Social Work Consult for Recovery Care Planning/Counseling Complete  Palliative Care Screening Complete  Medication Review Oceanographer) Referral to Pharmacy

## 2024-06-10 NOTE — Care Management (Signed)
 Patient's dressing changed on LLE before discharge.

## 2024-06-11 ENCOUNTER — Other Ambulatory Visit: Payer: Self-pay | Admitting: Internal Medicine

## 2024-06-11 DIAGNOSIS — J90 Pleural effusion, not elsewhere classified: Secondary | ICD-10-CM | POA: Diagnosis not present

## 2024-06-11 DIAGNOSIS — K449 Diaphragmatic hernia without obstruction or gangrene: Secondary | ICD-10-CM | POA: Diagnosis not present

## 2024-06-11 DIAGNOSIS — Z87891 Personal history of nicotine dependence: Secondary | ICD-10-CM | POA: Diagnosis not present

## 2024-06-11 DIAGNOSIS — C7931 Secondary malignant neoplasm of brain: Secondary | ICD-10-CM | POA: Diagnosis not present

## 2024-06-11 DIAGNOSIS — D631 Anemia in chronic kidney disease: Secondary | ICD-10-CM | POA: Diagnosis not present

## 2024-06-11 DIAGNOSIS — R7303 Prediabetes: Secondary | ICD-10-CM | POA: Diagnosis not present

## 2024-06-11 DIAGNOSIS — M170 Bilateral primary osteoarthritis of knee: Secondary | ICD-10-CM | POA: Diagnosis not present

## 2024-06-11 DIAGNOSIS — J9611 Chronic respiratory failure with hypoxia: Secondary | ICD-10-CM | POA: Diagnosis not present

## 2024-06-11 DIAGNOSIS — N200 Calculus of kidney: Secondary | ICD-10-CM | POA: Diagnosis not present

## 2024-06-11 DIAGNOSIS — E039 Hypothyroidism, unspecified: Secondary | ICD-10-CM | POA: Diagnosis not present

## 2024-06-11 DIAGNOSIS — I129 Hypertensive chronic kidney disease with stage 1 through stage 4 chronic kidney disease, or unspecified chronic kidney disease: Secondary | ICD-10-CM | POA: Diagnosis not present

## 2024-06-11 DIAGNOSIS — Z7951 Long term (current) use of inhaled steroids: Secondary | ICD-10-CM | POA: Diagnosis not present

## 2024-06-11 DIAGNOSIS — N184 Chronic kidney disease, stage 4 (severe): Secondary | ICD-10-CM | POA: Diagnosis not present

## 2024-06-11 DIAGNOSIS — I2510 Atherosclerotic heart disease of native coronary artery without angina pectoris: Secondary | ICD-10-CM | POA: Diagnosis not present

## 2024-06-11 DIAGNOSIS — C3490 Malignant neoplasm of unspecified part of unspecified bronchus or lung: Secondary | ICD-10-CM | POA: Diagnosis not present

## 2024-06-11 DIAGNOSIS — Z9981 Dependence on supplemental oxygen: Secondary | ICD-10-CM | POA: Diagnosis not present

## 2024-06-11 DIAGNOSIS — Z8701 Personal history of pneumonia (recurrent): Secondary | ICD-10-CM | POA: Diagnosis not present

## 2024-06-11 DIAGNOSIS — Z85828 Personal history of other malignant neoplasm of skin: Secondary | ICD-10-CM | POA: Diagnosis not present

## 2024-06-11 DIAGNOSIS — D630 Anemia in neoplastic disease: Secondary | ICD-10-CM | POA: Diagnosis not present

## 2024-06-11 DIAGNOSIS — K6389 Other specified diseases of intestine: Secondary | ICD-10-CM | POA: Diagnosis not present

## 2024-06-11 DIAGNOSIS — I700 Atherosclerosis of aorta: Secondary | ICD-10-CM | POA: Diagnosis not present

## 2024-06-11 DIAGNOSIS — Z7982 Long term (current) use of aspirin: Secondary | ICD-10-CM | POA: Diagnosis not present

## 2024-06-11 DIAGNOSIS — I252 Old myocardial infarction: Secondary | ICD-10-CM | POA: Diagnosis not present

## 2024-06-11 DIAGNOSIS — J439 Emphysema, unspecified: Secondary | ICD-10-CM | POA: Diagnosis not present

## 2024-06-11 MED ORDER — METRONIDAZOLE 500 MG PO TABS: 500.0000 mg | ORAL_TABLET | Freq: Two times a day (BID) | ORAL | 0 refills | Status: AC

## 2024-06-11 NOTE — Progress Notes (Signed)
 Contacted by Manuelita, [facility] nurse for [facility] with following concerns:  Pt was recently diagnosed with left cerebellar mass. Discharge meds reviewed with nurse, metronidazole was not located. Will send rx as requested.   Also concerned about BP 170/64  - pt was taken off of lisinopril  during hospitalization. Was not given anything to replace it. Only taking metoprolol , admits she didn't take last night.  Repeat SBP 162. She will take Metoprolol  now INSTEAD of tonight and resume previous dosing schedule tomorrow night. Advised to decrease intake of salty foods and to   Advised to schedule hospital f/u with PCP, [name]. I will have staff contact hr on Monday morning for TCM call (if not done) and to schedule hospital f/u. States 10/23 would work best for her. Will forward this information to the staff.   All questions were answered to her satisfaction.   RS

## 2024-06-13 ENCOUNTER — Telehealth: Payer: Self-pay | Admitting: Internal Medicine

## 2024-06-13 ENCOUNTER — Telehealth: Payer: Self-pay | Admitting: Radiation Therapy

## 2024-06-13 ENCOUNTER — Ambulatory Visit: Payer: Self-pay

## 2024-06-13 ENCOUNTER — Telehealth: Payer: Self-pay | Admitting: *Deleted

## 2024-06-13 DIAGNOSIS — Z87891 Personal history of nicotine dependence: Secondary | ICD-10-CM | POA: Diagnosis not present

## 2024-06-13 DIAGNOSIS — Z510 Encounter for antineoplastic radiation therapy: Secondary | ICD-10-CM | POA: Diagnosis not present

## 2024-06-13 DIAGNOSIS — Z85828 Personal history of other malignant neoplasm of skin: Secondary | ICD-10-CM | POA: Diagnosis not present

## 2024-06-13 DIAGNOSIS — C3432 Malignant neoplasm of lower lobe, left bronchus or lung: Secondary | ICD-10-CM | POA: Diagnosis not present

## 2024-06-13 DIAGNOSIS — C7931 Secondary malignant neoplasm of brain: Secondary | ICD-10-CM | POA: Diagnosis not present

## 2024-06-13 NOTE — Telephone Encounter (Signed)
 Left a detailed message for patient about her scheduled transportation and treatment on 10/21. My contact information was included with encouragement to call me with any questions or concerns.   Devere Perch R.T(R)(T) Radiation Special Procedures Lead

## 2024-06-13 NOTE — Telephone Encounter (Signed)
 Scheduled appointments with the patient per staff message. The patient was slightly confused but we were able to straighten all appointments out. She is aware of the changes made.

## 2024-06-13 NOTE — Transitions of Care (Post Inpatient/ED Visit) (Signed)
 06/13/2024  Name: Abigail [name] MRN: [id number] DOB: May 01, 1936  Today's TOC FU Call Status: Today's TOC FU Call Status:: Successful TOC FU Call Completed TOC FU Call Complete Date: 06/13/24 Patient's Name and Date of Birth confirmed.  Transition Care Management Follow-up Telephone Call Date of Discharge: 06/10/24 Discharge Facility: Abigail [name] [facility]) Type of Discharge: Inpatient Admission Primary Inpatient Discharge Diagnosis:: Nausea & vomiting How have you been since you were released from the hospital?: Better (Patient feeling better, but overwhelmed) Any questions or concerns?: Yes Patient Questions/Concerns:: Patient is concerned that she has not had a BM since 06/08/24. Patient Questions/Concerns Addressed: Provided Patient Educational Materials  Items Reviewed: Did you receive and understand the discharge instructions provided?: Yes Medications obtained,verified, and reconciled?: Yes (Medications Reviewed) Any new allergies since your discharge?: No Dietary orders reviewed?: Yes Type of Diet Ordered:: General Do you have support at home?: Yes People in Home [RPT]: child(ren), adult Name of Support/Comfort Primary Source: Son  Medications Reviewed Today: Medications Reviewed Today     Reviewed by Abigail Andrea LABOR, RN (Registered Nurse) on 06/13/24 at 1135  Med List Status: <None>   Medication Order Taking? Sig Documenting Provider Last Dose Status Informant  acetaminophen  (TYLENOL ) 500 MG tablet [id number] Yes Take 500 mg by mouth every 6 (six) hours as needed (for leg pain). [provider]  Active Self  albuterol  (VENTOLIN  HFA) 108 (90 Base) MCG/ACT inhaler [id number] Yes TAKE 2 PUFFS BY MOUTH EVERY 6 HOURS AS NEEDED FOR WHEEZE OR SHORTNESS OF BREATH Heilingoetter, Cassandra L, PA-C  Active Self  aspirin  EC 81 MG tablet [id number] Yes Take 81 mg by mouth at bedtime. [provider]  Active Self  atorvastatin  (LIPITOR ) 80 MG tablet [id number] Yes Take 80 mg  by mouth daily as needed (for cholesterol). [provider]  Active Self           Med Note (SATTERFIELD, TEENA BRAVO   Mon Jun 06, 2024  5:08 PM) Patient verified she is taking this medication as needed   BREZTRI  AEROSPHERE 160-9-4.8 MCG/ACT AERO inhaler [id number] Yes INHALE 2 PUFFS INTO THE LUNGS IN THE MORNING AND ALSO 2 PUFFS AT BEDTIME Shelah Lamar RAMAN, MD  Active Self  cefdinir (OMNICEF) 300 MG capsule [id number] Yes Take 1 capsule (300 mg total) by mouth 2 (two) times daily for 5 days. Arlice Reichert, MD  Active   dexamethasone  (DECADRON ) 2 MG tablet [id number] Yes Take 1 tablet (2 mg total) by mouth 2 (two) times daily. Arlice Reichert, MD  Active   estradiol  (ESTRACE ) 0.1 MG/GM vaginal cream [id number]  Place 0.5g twice a week at opening of vagina  Patient not taking: Reported on 06/13/2024   Marilynne Rosaline SAILOR, MD  Active Self           Med Note (SATTERFIELD, TEENA BRAVO [place] Jun 06, 2024  5:07 PM) No specific days   Evolocumab  (REPATHA  SURECLICK) 140 MG/ML SOAJ [id number] Yes Inject 140 mg into the skin every 14 (fourteen) days. Patwardhan, Newman PARAS, MD  Active Self  feeding supplement (ENSURE PLUS HIGH PROTEIN) LIQD [id number] Yes Take 237 mLs by mouth 2 (two) times daily between meals.  Patient taking differently: Take 237 mLs by mouth daily.   Arlice Reichert, MD  Active   ferrous sulfate  325 (65 FE) MG EC tablet [id number] Yes TAKE 1 TABLET BY MOUTH EVERY DAY WITH BREAKFAST Heilingoetter, Cassandra L, PA-C  Active Self  HYDROcodone -acetaminophen  (NORCO/VICODIN) 5-325 MG tablet [id number] Yes Take 1-2 tablets by  mouth every 6 (six) hours as needed for moderate pain (pain score 4-6). Arlice Reichert, MD  Active   levothyroxine  (SYNTHROID ) 175 MCG tablet [id number] Yes TAKE 1 TABLET BY MOUTH EVERY MORNING ON MONDAY - FRIDAY Moore, [name], FNP  Active Self  metoprolol  succinate (TOPROL -XL) 50 MG 24 hr tablet [id number] Yes Take 50 mg by mouth every evening. [provider]  Active Self   metroNIDAZOLE (FLAGYL) 500 MG tablet [id number] Yes Take 1 tablet (500 mg total) by mouth 2 (two) times daily for 5 days. Jarold Medici, MD  Active   nitrofurantoin  (MACRODANTIN ) 100 MG capsule [id number] Yes Take 100 mg by mouth daily. [provider]  Active Self  nitroGLYCERIN  (NITROSTAT ) 0.4 MG SL tablet [id number] Yes PLACE 1 TABLET UNDER THE TONGUE EVERY 5 (FIVE) MINUTES X 3 DOSES AS NEEDED FOR CHEST PAIN. [name], FNP  Active Self           Med Note ROMELLE MARCHA MALVA Austin Oct 11, 2023 12:10 PM)    OXYGEN  [id number] Yes Inhale 3 L into the lungs continuous. [provider]  Active Self  polyethylene glycol powder (GLYCOLAX /MIRALAX ) 17 GM/SCOOP powder [id number] Yes Dissolve 1 capful (17g) in 4-8 ounces of liquid and take by mouth daily as needed for mild constipation. Arlice Reichert, MD  Active   trospium  (SANCTURA ) 20 MG tablet [id number] Yes Take 1 tablet (20 mg total) by mouth 2 (two) times daily. Marilynne Rosaline SAILOR, MD  Active Self            Home Care and Equipment/Supplies: Were Home Health Services Ordered?: Yes Name of Home Health Agency:: [facility] Has Agency set up a time to come to your home?: Yes First Home Health Visit Date: 06/13/24 Any new equipment or medical supplies ordered?: No  Functional Questionnaire: Do you need assistance with bathing/showering or dressing?: Yes Do you need assistance with meal preparation?: Yes (Patient recieves MOW) Do you need assistance with eating?: No Do you have difficulty maintaining continence: No Do you need assistance with getting out of bed/getting out of a chair/moving?: No (sleeps on the couch) Do you have difficulty managing or taking your medications?: No  Follow up appointments reviewed: PCP Follow-up appointment confirmed?: Yes Date of PCP follow-up appointment?: 06/27/24 Follow-up Provider: Dr. Jarold Specialist [facility] Follow-up appointment confirmed?: Yes Date of Specialist follow-up  appointment?: 06/14/24 Follow-Up Specialty Provider:: SRS treatment with Dr. Izell Do you need transportation to your follow-up appointment?: Yes Transportation Need Intervention Addressed By:: Transportation Arranged (RNCM will assist with arranging transportation to appointments on 06/20/24 and 06/27/24) Do you understand care options if your condition(s) worsen?: Yes-patient verbalized understanding  SDOH Interventions Today    Flowsheet Row Most Recent Value  SDOH Interventions   Food Insecurity Interventions Intervention Not Indicated  Housing Interventions Intervention Not Indicated  Transportation Interventions Intervention Not Indicated  Utilities Interventions Intervention Not Indicated    Goals Addressed             This Visit's Progress    VBCI Transitions of Care (TOC) Care Plan       Problems:  Recent Hospitalization for treatment of Nausea and Vomiting related to new brain mass Knowledge Deficit Related to plan of care and symptom management  Goal:  Over the next 30 days, the patient will not experience hospital readmission  Interventions:  Transitions of Care: Durable Medical Equipment (DME) reviewed with patient/caregiver Doctor Visits  - discussed the importance of doctor visits Post discharge activity limitations prescribed  by provider reviewed Post-op wound/incision care reviewed with patient/caregiver Reviewed Signs and symptoms of infection Provided education on constipation SDOH assessment Medication Review-discussed antibiotics and the importance of completing these medications Discussed the importance of a calendar to organize appointments and communications with providers Ensured [facility] involvement, first visit on 06/11/24, visit planned for today Reviewed upcoming appointments and transportation that was arranged with Radiation team Collaborated with CCM, [facility]  Patient Self Care Activities:  Attend all scheduled provider appointments Call  pharmacy for medication refills 3-7 days in advance of running out of medications Call provider office for new concerns or questions  Notify RN Care Manager of [facility] call rescheduling needs Participate in Transition of Care Program/Attend [facility] scheduled calls Take medications as prescribed    Plan:  Telephone follow up appointment with care management team member scheduled for:  06/17/24 at 2pm        Andrea Dimes RN, BSN [place]  [facility] RN Care Manager [phone]

## 2024-06-13 NOTE — Telephone Encounter (Signed)
 FYI Only or Action Required?: Action required by provider: Pt is having high bp readings w/o any chest pain. Pt is sob, but at her normal.  PT was recently taken off of lisinopril . Pt does not have transportation to come into office, and is unable to have a VV. PT will be seen by several other providers and will have BP measured at those appts.  Signe is also requesting a PT order 1x per week for 4 weeks. [phone]  Patient was last seen in primary care on 05/19/2024 by Petrina Pries, NP.  Called Nurse Triage reporting Hypertension.  Symptoms began today.  Interventions attempted: Prescription medications: Metoprolol .  Symptoms are: unchanged.  Triage Disposition: See Physician Within 24 Hours  Patient/caregiver understands and will follow disposition?: no has not transportation - cannot do VV. Pt will ask providers from upcoming appts to take BP and report to PCP.            Copied from CRM #[id number]. Topic: Clinical - Red Word Triage >> Jun 13, 2024  4:51 PM Drema MATSU wrote: Red Word that prompted transfer to Nurse Triage: Signe states that pt has asymptomatic high blood pressure 180/80. She is taking metoprolol  which she takes in the evening around this time. But sometimes she forgets.  Nurse came on 10/18 and pt blood pressure was 170/64 Reason for Disposition  Systolic BP >= 180 OR Diastolic >= 110  Answer Assessment - Initial Assessment Questions Call from Signe [place] with PT on a home health visit. Signe states that he has taken pt's BP 2 times and both times the reading was 180/80. Pt is having no chest pain, or neurological issues. Pt is SOB, but is at her baseline.  Pt takes her  metoprolol  succinate (TOPROL -XL) 50 MG 24 hr tablet [[id number]] between 5 and 6 pm. She has not yet taken it today.  She does not have a automatic home BP cuff. Pt was recently taken off of lisinopril . Pt does not have transportation to come into the office and is unable to have a vv. Pt does have  several upcoming appts with oncology. She will have her blood pressure measured there and will ask those providers to send those readings to the office.   Signe is requesting PT for pt 1 time per week for 4 weeks. He can take Verbal order at [phone]       1. BLOOD PRESSURE: What is your blood pressure? Did you take at least two measurements 5 minutes apart?     180/80 - 2 times the same 2. ONSET: When did you take your blood pressure?     2 times 20 minutes ago 3. HOW: How did you take your blood pressure? (e.g., automatic home BP monitor, visiting nurse)     Manual on left 4. HISTORY: Do you have a history of high blood pressure?     yes 5. MEDICINES: Are you taking any medicines for blood pressure? Have you missed any doses recently?     Metoprolol  6. OTHER SYMPTOMS: Do you have any symptoms? (e.g., blurred vision, chest pain, difficulty breathing, headache, weakness)     Has sob - but is at her normal  Protocols used: Blood Pressure - High-A-AH

## 2024-06-14 ENCOUNTER — Inpatient Hospital Stay

## 2024-06-14 ENCOUNTER — Other Ambulatory Visit: Payer: Self-pay

## 2024-06-14 ENCOUNTER — Ambulatory Visit
Admission: RE | Admit: 2024-06-14 | Discharge: 2024-06-14 | Disposition: A | Discharge status: Home / Self Care | Source: Ambulatory Visit | Attending: Radiation Oncology | Admitting: Radiation Oncology

## 2024-06-14 ENCOUNTER — Other Ambulatory Visit: Payer: Self-pay | Admitting: Radiation Oncology

## 2024-06-14 DIAGNOSIS — Z510 Encounter for antineoplastic radiation therapy: Secondary | ICD-10-CM | POA: Diagnosis not present

## 2024-06-14 DIAGNOSIS — C7931 Secondary malignant neoplasm of brain: Secondary | ICD-10-CM

## 2024-06-14 DIAGNOSIS — Z85118 Personal history of other malignant neoplasm of bronchus and lung: Secondary | ICD-10-CM | POA: Insufficient documentation

## 2024-06-14 DIAGNOSIS — C3490 Malignant neoplasm of unspecified part of unspecified bronchus or lung: Secondary | ICD-10-CM

## 2024-06-14 DIAGNOSIS — Z85828 Personal history of other malignant neoplasm of skin: Secondary | ICD-10-CM | POA: Insufficient documentation

## 2024-06-14 LAB — RAD ONC ARIA SESSION SUMMARY
Course Elapsed Days: 0
Plan Fractions Treated to Date: 1
Plan Prescribed Dose Per Fraction: 8 Gy
Plan Total Fractions Prescribed: 3
Plan Total Prescribed Dose: 24 Gy
Reference Point Dosage Given to Date: 8 Gy
Reference Point Session Dosage Given: 8 Gy
Session Number: 1

## 2024-06-14 MED ORDER — DEXAMETHASONE 2 MG PO TABS: ORAL_TABLET | ORAL | 0 refills | Status: DC

## 2024-06-14 NOTE — Progress Notes (Signed)
 Nurse monitoring complete status post of  SRS treatments. Patient without complaints. Patient denies new or worsening neurologic symptoms. Vitals stable. Instructed patient to avoid strenuous activity for the next 24 hours. (Instructed patient to not miss any of her decadron  doses). Instructed patient to call [phone] with needs related to treatment after hours or over the weekend. Patient verbalized understanding  Abigail [name] rested with us  for 15 minutes following SRS treatment.  Patient denies headache, dizziness, nausea, diplopia or ringing in the ears. Denies fatigue. Patient without complaints. Understands to avoid strenuous activity for the next 24 hours and call [phone] with needs

## 2024-06-14 NOTE — Progress Notes (Signed)
  Name: Abigail [name]  MRN: [id number]  Date: 06/14/2024   DOB: [birth year]  Stereotactic Radiosurgery Operative Note  PRE-OPERATIVE DIAGNOSIS:  Solitary Brain Metastasis  POST-OPERATIVE DIAGNOSIS:  Solitary Brain Metastasis  PROCEDURE:  Stereotactic Radiosurgery  SURGEON:  Victory JINNY Gens, MD  NARRATIVE: The patient underwent a radiation treatment planning session in the radiation oncology simulation suite under the care of the radiation oncology physician and physicist.  I participated closely in the radiation treatment planning afterwards. The patient underwent planning CT which was fused to 3T high resolution MRI with 1 mm axial slices.  These images were fused on the planning system.  We contoured the gross target volumes and subsequently expanded this to yield the Planning Target Volume. I actively participated in the planning process.  I helped to define and review the target contours and also the contours of the optic pathway, eyes, brainstem and selected nearby organs at risk.  All the dose constraints for critical structures were reviewed and compared to AAPM Task Group 101.  The prescription dose conformity was reviewed.  I approved the plan electronically.    Accordingly, Abigail [name] was brought to the TrueBeam stereotactic radiation treatment linac and placed in the custom immobilization mask.  The patient was aligned according to the IR fiducial markers with BrainLab Exactrac, then orthogonal x-rays were used in ExacTrac with the 6DOF robotic table and the shifts were made to align the patient  Abigail [name] received stereotactic radiosurgery uneventfully.    The detailed description of the procedure is recorded in the radiation oncology procedure note.  I was present for the duration of the procedure.  DISPOSITION:  Following delivery, the patient was transported to nursing in stable condition and monitored for possible acute effects to be discharged to home in stable condition with  follow-up in one month.  Victory JINNY Gens, MD 06/14/2024 3:13 PM

## 2024-06-14 NOTE — Progress Notes (Signed)
 I met with Abigail [name] after her first fraction of stereotactic radiosurgery to the metastasis in the posterior fossa.  She did very well with her first treatment.  I gave her a dexamethasone  taper plan to help prevent peritumoral edema during treatment.  I wrote out the instructions on a piece of paper and explained how to taper her dosing through November 27.  No thrush on physical exam today.  She was escorted out by wheelchair.  Dexamethasone  2mg  tablet taper  Take 4 tablets daily with breakfast through 10/29. Reduce to 2 tablets daily with breakfast from 10/30 through 11/12. Reduce to 1 tablet daily with breakfast from 11/13 through 11/27. Last dose: 11/27.  -----------------------------------  Abigail Golden, MD

## 2024-06-15 ENCOUNTER — Ambulatory Visit: Payer: Self-pay | Admitting: Adult Health

## 2024-06-15 NOTE — Progress Notes (Signed)
 Nurse monitoring complete status post  of  SRS treatments. Patient without complaints. Patient denies new or worsening neurologic symptoms. Vitals stable. Instructed patient to avoid strenuous activity for the next 24 hours. (Instructed patient to not miss any of her decadron  doses). Instructed patient to call [phone] with needs related to treatment after hours or over the weekend. Patient verbalized understanding    Ronal Like rested with us  for 15 minutes following SRS treatment.  Patient has headache and dizziness, denies nausea, diplopia or ringing in the ears. Denies fatigue. Patient without complaints. Understands to avoid strenuous activity for the next 24 hours and call [phone] with needs    Wt Readings from Last 3 Encounters:  06/20/24 154 lb (69.9 kg)  06/10/24 163 lb 5.8 oz (74.1 kg)  05/19/24 160 lb (72.6 kg)

## 2024-06-17 ENCOUNTER — Other Ambulatory Visit: Payer: Self-pay | Admitting: *Deleted

## 2024-06-17 ENCOUNTER — Other Ambulatory Visit: Payer: Self-pay | Admitting: Radiation Oncology

## 2024-06-17 ENCOUNTER — Ambulatory Visit: Admitting: Radiation Oncology

## 2024-06-17 ENCOUNTER — Ambulatory Visit
Admission: RE | Admit: 2024-06-17 | Discharge: 2024-06-17 | Disposition: A | Discharge status: Home / Self Care | Source: Ambulatory Visit | Attending: Radiation Oncology | Admitting: Radiation Oncology

## 2024-06-17 ENCOUNTER — Inpatient Hospital Stay

## 2024-06-17 ENCOUNTER — Other Ambulatory Visit: Payer: Self-pay

## 2024-06-17 DIAGNOSIS — Z510 Encounter for antineoplastic radiation therapy: Secondary | ICD-10-CM | POA: Diagnosis not present

## 2024-06-17 DIAGNOSIS — C3490 Malignant neoplasm of unspecified part of unspecified bronchus or lung: Secondary | ICD-10-CM

## 2024-06-17 DIAGNOSIS — C7931 Secondary malignant neoplasm of brain: Secondary | ICD-10-CM

## 2024-06-17 LAB — RAD ONC ARIA SESSION SUMMARY
Course Elapsed Days: 3
Plan Fractions Treated to Date: 2
Plan Prescribed Dose Per Fraction: 8 Gy
Plan Total Fractions Prescribed: 3
Plan Total Prescribed Dose: 24 Gy
Reference Point Dosage Given to Date: 16 Gy
Reference Point Session Dosage Given: 8 Gy
Session Number: 2

## 2024-06-17 MED ORDER — FLUCONAZOLE 100 MG PO TABS: ORAL_TABLET | ORAL | 0 refills | Status: DC

## 2024-06-17 NOTE — Patient Instructions (Signed)
 Visit Information  Thank you for taking time to visit with me today. Please don't hesitate to contact me if I can be of assistance to you before our next scheduled telephone appointment.   Following is a copy of your care plan:   Goals Addressed             This Visit's Progress    [facility] Transitions of Care (TOC) Care Plan       Problems:  Recent Hospitalization for treatment of Nausea and Vomiting related to new brain mass Knowledge Deficit Related to plan of care and symptom management  Goal:  Over the next 30 days, the patient will not experience hospital readmission  Interventions:  Transitions of Care: Durable Medical Equipment (DME) reviewed with patient/caregiver Doctor Visits  - discussed the importance of doctor visits Post discharge activity limitations prescribed by provider reviewed Post-op wound/incision care reviewed with patient/caregiver Reviewed Signs and symptoms of infection Provided education on constipation-constipation has resolved Discussed the importance of a calendar to organize appointments and communications with providers-revisited [facility] involvement, next visit on 06/22/24 Reviewed upcoming appointments including:06/20/24 for lab, Dr. Roderic, Radiation-patient reports transportation has been arranged and 06/27/24 with PCP-patient has a friend that will take her to this appointment   Patient Self Care Activities:  Attend all scheduled provider appointments Call pharmacy for medication refills 3-7 days in advance of running out of medications Call provider office for new concerns or questions  Notify RN Care Manager of [facility] call rescheduling needs Participate in Transition of Care Program/Attend [facility] scheduled calls Take medications as prescribed    Plan:  Telephone follow up appointment with care management team member scheduled for:  06/24/24 at 1:45 pm        Patient verbalizes understanding of instructions and care plan provided  today and agrees to view in MyChart. Active MyChart status and patient understanding of how to access instructions and care plan via MyChart confirmed with patient.     Telephone follow up appointment with care management team member scheduled for:06/24/24 at 1:45pm  Please call the care guide team at [phone] if you need to cancel or reschedule your appointment.   Please call 1-800-273-TALK (toll free, 24 hour hotline) go to [facility] Urgent [facility] [address], [place] [phone]) call 911 if you are experiencing a Mental Health or Behavioral Health Crisis or need someone to talk to.   Andrea Dimes RN, BSN [place]  [facility] RN Care Manager [phone]

## 2024-06-17 NOTE — Progress Notes (Signed)
 Patient rested with us  for 15 minutes following his SRS treatment.  Patient denies headache, dizziness, nausea, diplopia or ringing in the ears. Denies fatigue. Patient without complaints. Understands to avoid strenuous activity for the next 24 hours and call [phone] with needs.    BP (!) 174/72   Pulse 63   Temp (!) 96.8 F (36 C)   Resp 18   SpO2 98%

## 2024-06-17 NOTE — Transitions of Care (Post Inpatient/ED Visit) (Signed)
  Transition of Care week 1, follow up  Visit Note  06/17/2024  Name: Abigail [name] MRN: [id number]          DOB: Jan 24, 1936  Situation: Patient enrolled in [facility] 30-day program. Visit completed with Ms. Luby by telephone.   Background:   Initial Transition Care Management Follow-up Telephone Call Discharge Date and Diagnosis: 06/10/24, Nausea & vomiting   Past Medical History:  Diagnosis Date   Anemia    Arthritis    knees, back   Bacteremia 04/08/2012   CAP (community acquired pneumonia) 04/12/2012   Carotid artery occlusion    Chronic kidney disease    stage 3 ckd no nephrologist   Complete uterine prolapse with prolapse of anterior vaginal wall    Complication of anesthesia    hard to wake up per pt   Constipation    Coronary artery disease    Diverticulitis yrs ago coialitis   Dyspnea    History of blood transfusion    History of radiation therapy    right lung 08/05/2021-09/18/2021  Dr Lynwood Nasuti   Hypertension    Hypothyroid    LLQ abdominal pain 04/05/2012   Lung cancer (HCC)    Numbness    in hands at times   Pneumonia    Pre-diabetes    Scoliosis    STEMI (ST elevation myocardial infarction) (HCC) 10/26/2019   DES RCA   Wears dentures    full dentures   Wears glasses    for reading    Assessment: Patient Reported Symptoms: Cognitive Cognitive Status: Able to follow simple commands, Normal speech and language skills, Alert and oriented to person, place, and time      Neurological Neurological Review of Symptoms: Weakness Neurological Management Strategies: Routine screening, Adequate rest, Coping strategies Neurological Self-Management Outcome: 3 (uncertain) Neurological Comment: Patient completed second radiation treatment today. Feeling tired and worn out, but overall feeling better and getting her energy back.  HEENT HEENT Symptoms Reported: Not assessed      Cardiovascular Cardiovascular Symptoms Reported: Not assessed    Respiratory  Respiratory Symptoms Reported: Not assesed    Endocrine Endocrine Symptoms Reported: Not assessed    Gastrointestinal Gastrointestinal Symptoms Reported: No symptoms reported Additional Gastrointestinal Details: LBM 06/17/24 Gastrointestinal Management Strategies: Adequate rest, Medication therapy, Diet modification, Coping strategies Gastrointestinal Self-Management Outcome: 4 (good) Gastrointestinal Comment: Constipation has resolved    Genitourinary Genitourinary Symptoms Reported: Not assessed    Integumentary Integumentary Symptoms Reported: Wound Skin Management Strategies: Dressing changes, Medication therapy, Coping strategies, Adequate rest, Routine screening Skin Self-Management Outcome: 3 (uncertain) Skin Comment: Wound is healing, dressing changes daily, hit her leg on a chair today and scratched the area. Dressing changed by office RN.  Musculoskeletal Musculoskelatal Symptoms Reviewed: Weakness Musculoskeletal Management Strategies: Medication therapy, Adequate rest, Routine screening Musculoskeletal Self-Management Outcome: 3 (uncertain) Musculoskeletal Comment: Completed second radiation treatment today which tires her out.      Psychosocial Psychosocial Symptoms Reported: No symptoms reported         There were no vitals filed for this visit.  Patient feeling tired from radiation treatment, medications were not reviewed today. Medications Reviewed Today   Medications were not reviewed in this encounter     Recommendation:   Continue Current Plan of Care  Follow Up Plan:   Telephone follow-up in 1 week  Andrea Dimes RN, BSN [place]  [facility] RN Care Manager [phone]

## 2024-06-19 DIAGNOSIS — C3490 Malignant neoplasm of unspecified part of unspecified bronchus or lung: Secondary | ICD-10-CM | POA: Diagnosis not present

## 2024-06-19 DIAGNOSIS — J449 Chronic obstructive pulmonary disease, unspecified: Secondary | ICD-10-CM | POA: Diagnosis not present

## 2024-06-19 DIAGNOSIS — J9610 Chronic respiratory failure, unspecified whether with hypoxia or hypercapnia: Secondary | ICD-10-CM | POA: Diagnosis not present

## 2024-06-20 ENCOUNTER — Other Ambulatory Visit: Payer: Self-pay | Admitting: Radiation Oncology

## 2024-06-20 ENCOUNTER — Other Ambulatory Visit: Payer: Self-pay

## 2024-06-20 ENCOUNTER — Telehealth: Payer: Self-pay

## 2024-06-20 ENCOUNTER — Inpatient Hospital Stay

## 2024-06-20 ENCOUNTER — Inpatient Hospital Stay: Admitting: Internal Medicine

## 2024-06-20 ENCOUNTER — Ambulatory Visit: Admission: RE | Admit: 2024-06-20 | Source: Ambulatory Visit

## 2024-06-20 ENCOUNTER — Ambulatory Visit
Admission: RE | Admit: 2024-06-20 | Discharge: 2024-06-20 | Disposition: A | Source: Ambulatory Visit | Attending: Radiation Oncology | Admitting: Radiation Oncology

## 2024-06-20 VITALS — BP 165/50 | HR 56 | Temp 97.6°F | Resp 17 | Ht 66.0 in | Wt 154.0 lb

## 2024-06-20 DIAGNOSIS — C7931 Secondary malignant neoplasm of brain: Secondary | ICD-10-CM | POA: Diagnosis not present

## 2024-06-20 DIAGNOSIS — C3432 Malignant neoplasm of lower lobe, left bronchus or lung: Secondary | ICD-10-CM | POA: Diagnosis not present

## 2024-06-20 DIAGNOSIS — C3490 Malignant neoplasm of unspecified part of unspecified bronchus or lung: Secondary | ICD-10-CM | POA: Diagnosis not present

## 2024-06-20 DIAGNOSIS — Z87891 Personal history of nicotine dependence: Secondary | ICD-10-CM | POA: Diagnosis not present

## 2024-06-20 DIAGNOSIS — Z510 Encounter for antineoplastic radiation therapy: Secondary | ICD-10-CM | POA: Diagnosis not present

## 2024-06-20 LAB — CBC WITH DIFFERENTIAL (CANCER CENTER ONLY)
Abs Immature Granulocytes: 0.33 K/uL — ABNORMAL HIGH (ref 0.00–0.07)
Basophils Absolute: 0 K/uL (ref 0.0–0.1)
Basophils Relative: 0 %
Eosinophils Absolute: 0 K/uL (ref 0.0–0.5)
Eosinophils Relative: 0 %
HCT: 29.8 % — ABNORMAL LOW (ref 36.0–46.0)
Hemoglobin: 9.3 g/dL — ABNORMAL LOW (ref 12.0–15.0)
Immature Granulocytes: 2 %
Lymphocytes Relative: 1 %
Lymphs Abs: 0.2 K/uL — ABNORMAL LOW (ref 0.7–4.0)
MCH: 27.4 pg (ref 26.0–34.0)
MCHC: 31.2 g/dL (ref 30.0–36.0)
MCV: 87.6 fL (ref 80.0–100.0)
Monocytes Absolute: 0.7 K/uL (ref 0.1–1.0)
Monocytes Relative: 3 %
Neutro Abs: 21.2 K/uL — ABNORMAL HIGH (ref 1.7–7.7)
Neutrophils Relative %: 94 %
Platelet Count: 361 K/uL (ref 150–400)
RBC: 3.4 MIL/uL — ABNORMAL LOW (ref 3.87–5.11)
RDW: 15.9 % — ABNORMAL HIGH (ref 11.5–15.5)
WBC Count: 22.5 K/uL — ABNORMAL HIGH (ref 4.0–10.5)
nRBC: 0 % (ref 0.0–0.2)

## 2024-06-20 LAB — RAD ONC ARIA SESSION SUMMARY
Course Elapsed Days: 6
Plan Fractions Treated to Date: 3
Plan Prescribed Dose Per Fraction: 8 Gy
Plan Total Fractions Prescribed: 3
Plan Total Prescribed Dose: 24 Gy
Reference Point Dosage Given to Date: 24 Gy
Reference Point Session Dosage Given: 8 Gy
Session Number: 3

## 2024-06-20 LAB — CMP (CANCER CENTER ONLY)
ALT: 14 U/L (ref 0–44)
AST: 14 U/L — ABNORMAL LOW (ref 15–41)
Albumin: 3.6 g/dL (ref 3.5–5.0)
Alkaline Phosphatase: 97 U/L (ref 38–126)
Anion gap: 7 (ref 5–15)
BUN: 66 mg/dL — ABNORMAL HIGH (ref 8–23)
CO2: 23 mmol/L (ref 22–32)
Calcium: 8.7 mg/dL — ABNORMAL LOW (ref 8.9–10.3)
Chloride: 106 mmol/L (ref 98–111)
Creatinine: 1.62 mg/dL — ABNORMAL HIGH (ref 0.44–1.00)
GFR, Estimated: 31 mL/min — ABNORMAL LOW (ref >60)
Glucose, Bld: 194 mg/dL — ABNORMAL HIGH (ref 70–99)
Potassium: 5.2 mmol/L — ABNORMAL HIGH (ref 3.5–5.1)
Sodium: 136 mmol/L (ref 135–145)
Total Bilirubin: 0.3 mg/dL (ref 0.0–1.2)
Total Protein: 6.8 g/dL (ref 6.5–8.1)

## 2024-06-20 MED ORDER — FLUCONAZOLE 100 MG PO TABS: ORAL_TABLET | ORAL | 0 refills | Status: DC

## 2024-06-20 NOTE — Patient Outreach (Signed)
 Received an update from Andrea Dimes RN, [facility] nurse advising this patient will remain enrolled with TOC for 30 days due to recent admission.   Clayborne Ly RN BSN CCM [place]  [facility], [facility] Nurse Care Coordinator  Direct Dial: [phone] Website: [email or web address]

## 2024-06-20 NOTE — Telephone Encounter (Signed)
 Copied from CRM [id number]. Topic: Clinical - Home Health Verbal Orders >> Jun 17, 2024  4:49 PM China J wrote: Caller/Agency: Barnie Sacks  [facility] Callback Number: [phone] Service Requested: Social Work Frequency: 1 week 2; 0 week 1; 1 week 1 Any new concerns about the patient? Yes The patient has a lack of care giver support. With recent diagnosis, she is concerned about not having proper caregiver support needed.  Verbal orders given to [employer].

## 2024-06-20 NOTE — Progress Notes (Signed)
 [facility] [phone]   [phone]  OFFICE PROGRESS NOTE  Abigail Speaks, FNP [address] [place] [address]  DIAGNOSIS: Metastatic non-small cell lung cancer initially presented with stage IIIA (T2a, N2, M0) non-small cell lung cancer, adenocarcinoma presented with left lower lobe lung mass in addition to left hilar and mediastinal lymphadenopathy diagnosed in November 2022 with no actionable mutation on the guardant blood test.  She had evidence of solitary metastatic brain lesion in October 2025.   PRIOR THERAPY:  1) Weekly concurrent chemoradiation with carboplatin  for an AUC of 2 and paclitaxel  45 mg per metered squared.  First dose on 08/05/2021.  Status post 6 cycles.  Last dose was given on September 03, 2021. 2) Consolidation treatment with immunotherapy with Imfinzi  1500 Mg IV every 4 weeks.  First dose October 23, 2021.  Status post 13 cycles. 3) status post SBRT to the solitary brain metastasis under the care of Dr. Izell  CURRENT THERAPY: Observation.  INTERVAL HISTORY: Abigail [name] 88 y.o. female returns to the clinic today for follow-up visit. Discussed the use of AI scribe software for clinical note transcription with the patient, who gave verbal consent to proceed.  History of Present Illness Abigail [name] is an 88 year old female with metastatic non-small cell lung cancer who presents with symptoms of a solitary left cerebellar brain metastasis. She is accompanied by her brother, Darina.  She has a history of metastatic non-small cell lung cancer, initially diagnosed as stage IIIA in November 2022. She underwent concurrent chemoradiation followed by one year of consolidation treatment with durvalumab , completed in February 2024. She has been on observation since then.  In October 2025, she developed symptoms of nausea and near-syncope. Approximately two weeks ago, she experienced nausea and near-syncope while getting up  from the couch to answer the door. She managed to call EMS and was taken to the hospital. Imaging, including a CT scan and MRI, revealed a solitary left cerebellar brain metastasis.  During her hospital stay, she underwent repeat imaging of the chest, abdomen, and pelvis, which showed no evidence of further metastatic spread. She was started on Decadron  for the brain metastasis and is currently undergoing her third session of stereotactic radiosurgery [facility]).  She has a history of two skin cancer surgeries on her legs since her last visit. She is concerned about her father's history of bone cancer, which she associates with exposure to certain chemicals.  She receives meals on wheels and has been exposed to chemicals like seven dust and Roundup in the past. No complete blackout episodes.     MEDICAL HISTORY: Past Medical History:  Diagnosis Date   Anemia    Arthritis    knees, back   Bacteremia 04/08/2012   CAP (community acquired pneumonia) 04/12/2012   Carotid artery occlusion    Chronic kidney disease    stage 3 ckd no nephrologist   Complete uterine prolapse with prolapse of anterior vaginal wall    Complication of anesthesia    hard to wake up per pt   Constipation    Coronary artery disease    Diverticulitis yrs ago coialitis   Dyspnea    History of blood transfusion    History of radiation therapy    right lung 08/05/2021-09/18/2021  Dr Lynwood Nasuti   Hypertension    Hypothyroid    LLQ abdominal pain 04/05/2012   Lung cancer (HCC)    Numbness  in hands at times   Pneumonia    Pre-diabetes    Scoliosis    STEMI (ST elevation myocardial infarction) (HCC) 10/26/2019   DES RCA   Wears dentures    full dentures   Wears glasses    for reading    ALLERGIES:  is allergic to codeine and norvasc  [amlodipine ].  MEDICATIONS:  Current Outpatient Medications  Medication Sig Dispense Refill   acetaminophen  (TYLENOL ) 500 MG tablet Take 500 mg by mouth every 6 (six) hours  as needed (for leg pain).     albuterol  (VENTOLIN  HFA) 108 (90 Base) MCG/ACT inhaler TAKE 2 PUFFS BY MOUTH EVERY 6 HOURS AS NEEDED FOR WHEEZE OR SHORTNESS OF BREATH 8.5 each 1   aspirin  EC 81 MG tablet Take 81 mg by mouth at bedtime.     atorvastatin  (LIPITOR ) 80 MG tablet Take 80 mg by mouth daily as needed (for cholesterol).     BREZTRI  AEROSPHERE 160-9-4.8 MCG/ACT AERO inhaler INHALE 2 PUFFS INTO THE LUNGS IN THE MORNING AND ALSO 2 PUFFS AT BEDTIME 10.7 each 6   dexamethasone  (DECADRON ) 2 MG tablet Take 4 tablets daily with breakfast through 10/29. Reduce to 2 tablets daily with breakfast from 10/30 through 11/12. Reduce to 1 tablet daily with breakfast from 11/13 through 11/27. Last dose: 11/27. 70 tablet 0   estradiol  (ESTRACE ) 0.1 MG/GM vaginal cream Place 0.5g twice a week at opening of vagina 42.5 g 11   Evolocumab  (REPATHA  SURECLICK) 140 MG/ML SOAJ Inject 140 mg into the skin every 14 (fourteen) days. 6 mL 3   feeding supplement (ENSURE PLUS HIGH PROTEIN) LIQD Take 237 mLs by mouth 2 (two) times daily between meals. (Patient taking differently: Take 237 mLs by mouth daily.)     ferrous sulfate  325 (65 FE) MG EC tablet TAKE 1 TABLET BY MOUTH EVERY DAY WITH BREAKFAST 30 tablet 2   fluconazole (DIFLUCAN) 100 MG tablet Take 2 tablets today, then 1 tablet daily x 20 more days. 22 tablet 0   HYDROcodone -acetaminophen  (NORCO/VICODIN) 5-325 MG tablet Take 1-2 tablets by mouth every 6 (six) hours as needed for moderate pain (pain score 4-6). 20 tablet 0   levothyroxine  (SYNTHROID ) 175 MCG tablet TAKE 1 TABLET BY MOUTH EVERY MORNING ON MONDAY - FRIDAY 90 tablet 1   metoprolol  succinate (TOPROL -XL) 50 MG 24 hr tablet Take 50 mg by mouth every evening.     nitrofurantoin  (MACRODANTIN ) 100 MG capsule Take 100 mg by mouth daily.     nitroGLYCERIN  (NITROSTAT ) 0.4 MG SL tablet PLACE 1 TABLET UNDER THE TONGUE EVERY 5 (FIVE) MINUTES X 3 DOSES AS NEEDED FOR CHEST PAIN. 25 tablet 3   OXYGEN  Inhale 3 L into the  lungs continuous.     polyethylene glycol powder (GLYCOLAX /MIRALAX ) 17 GM/SCOOP powder Dissolve 1 capful (17g) in 4-8 ounces of liquid and take by mouth daily as needed for mild constipation. 238 g 0   trospium  (SANCTURA ) 20 MG tablet Take 1 tablet (20 mg total) by mouth 2 (two) times daily. 180 tablet 3   No current facility-administered medications for this visit.    SURGICAL HISTORY:  Past Surgical History:  Procedure Laterality Date   ANTERIOR AND POSTERIOR REPAIR WITH SACROSPINOUS FIXATION N/A 12/20/2020   Procedure: SACROSPINOUS LIGAMENT FIXATION;  Surgeon: Marilynne Rosaline SAILOR, MD;  Location: [facility];  Service: Gynecology;  Laterality: N/A;  Total time requested for all procedures is 2 hours   BACK SURGERY  1980 age 5   lower  bartholin cyst removal  age 52's   BLADDER SUSPENSION N/A 12/20/2020   Procedure: TRANSVAGINAL TAPE (TVT) PROCEDURE;  Surgeon: Marilynne Rosaline SAILOR, MD;  Location: [facility];  Service: Gynecology;  Laterality: N/A;   BRONCHIAL BRUSHINGS  07/15/2021   Procedure: BRONCHIAL BRUSHINGS;  Surgeon: Shelah Lamar RAMAN, MD;  Location: [facility] ENDOSCOPY;  Service: Pulmonary;;   BRONCHIAL NEEDLE ASPIRATION BIOPSY  07/15/2021   Procedure: BRONCHIAL NEEDLE ASPIRATION BIOPSIES;  Surgeon: Shelah Lamar RAMAN, MD;  Location: [facility] ENDOSCOPY;  Service: Pulmonary;;   CORONARY/GRAFT ACUTE MI REVASCULARIZATION N/A 10/26/2019   Procedure: Coronary/Graft Acute MI Revascularization;  Surgeon: Wonda Sharper, MD;  Location: [facility] INVASIVE CV LAB;  Service: Cardiovascular;  Laterality: N/A;   CYSTOCELE REPAIR N/A 12/20/2020   Procedure: ANTERIOR AND POSTERIOR REPAIR WITH PERINEORRHAPHY;  Surgeon: Marilynne Rosaline SAILOR, MD;  Location: [facility];  Service: Gynecology;  Laterality: N/A;   CYSTOSCOPY N/A 12/20/2020   Procedure: CYSTOSCOPY;  Surgeon: Marilynne Rosaline SAILOR, MD;  Location: [facility];  Service: Gynecology;  Laterality:  N/A;   ELBOW SURGERY  1990's   left   ENDOBRONCHIAL ULTRASOUND N/A 07/15/2021   Procedure: ENDOBRONCHIAL ULTRASOUND;  Surgeon: Shelah Lamar RAMAN, MD;  Location[facility] ENDOSCOPY;  Service: Pulmonary;  Laterality: N/A;   HEMOSTASIS CONTROL  07/15/2021   Procedure: HEMOSTASIS CONTROL;  Surgeon: Shelah Lamar RAMAN, MD;  Location: [facility] ENDOSCOPY;  Service: Pulmonary;;   LEFT HEART CATH AND CORONARY ANGIOGRAPHY N/A 10/26/2019   Procedure: LEFT HEART CATH AND CORONARY ANGIOGRAPHY;  Surgeon: Wonda Sharper, MD;  Location: [facility] INVASIVE CV LAB;  Service: Cardiovascular;  Laterality: N/A;   TONSILLECTOMY      REVIEW OF SYSTEMS:  Constitutional: positive for fatigue Eyes: negative Ears, nose, mouth, throat, and face: negative Respiratory: positive for dyspnea on exertion Cardiovascular: negative Gastrointestinal: negative Genitourinary:negative Integument/breast: negative Hematologic/lymphatic: negative Musculoskeletal:positive for arthralgias Neurological: negative Behavioral/Psych: negative Endocrine: negative Allergic/Immunologic: negative   PHYSICAL EXAMINATION: General appearance: alert, cooperative, fatigued, and no distress Head: Normocephalic, without obvious abnormality, atraumatic Neck: no adenopathy, no JVD, supple, symmetrical, trachea midline, and thyroid  not enlarged, symmetric, no tenderness/mass/nodules Lymph nodes: Cervical, supraclavicular, and axillary nodes normal. Resp: clear to auscultation bilaterally Back: symmetric, no curvature. ROM normal. No CVA tenderness. Cardio: regular rate and rhythm, S1, S2 normal, no murmur, click, rub or gallop GI: soft, non-tender; bowel sounds normal; no masses,  no organomegaly Extremities: extremities normal, atraumatic, no cyanosis or edema Neurologic: Alert and oriented X 3, normal strength and tone. Normal symmetric reflexes. Normal coordination and gait  ECOG PERFORMANCE STATUS: 1 - Symptomatic but completely ambulatory  Blood pressure  (!) 165/50, pulse (!) 56, temperature 97.6 F (36.4 C), temperature source Temporal, resp. rate 17, height 5' 6 (1.676 m), weight 154 lb (69.9 kg), SpO2 98%.  LABORATORY DATA: Lab Results  Component Value Date   WBC 22.5 (H) 06/20/2024   HGB 9.3 (L) 06/20/2024   HCT 29.8 (L) 06/20/2024   MCV 87.6 06/20/2024   PLT 361 06/20/2024      Chemistry      Component Value Date/Time   NA 134 (L) 06/10/2024 0159   NA 137 02/17/2024 1150   K 5.0 06/10/2024 0159   CL 100 06/10/2024 0159   CO2 24 06/10/2024 0159   BUN 40 (H) 06/10/2024 0159   BUN 38 (H) 02/17/2024 1150   CREATININE 1.65 (H) 06/10/2024 0159   CREATININE 1.37 (H) 04/04/2024 1435   CREATININE 1.08 04/18/2014 1516   GLU 104 12/23/2017 1111  Component Value Date/Time   CALCIUM  8.5 (L) 06/10/2024 0159   ALKPHOS 100 06/07/2024 0340   AST 16 06/07/2024 0340   AST 12 (L) 04/04/2024 1435   ALT 12 06/07/2024 0340   ALT 10 04/04/2024 1435   BILITOT 0.6 06/07/2024 0340   BILITOT 0.2 04/04/2024 1435       RADIOGRAPHIC STUDIES: MR BRAIN W WO CONTRAST Result Date: 06/08/2024 EXAM: MRI BRAIN WITH AND WITHOUT CONTRAST 06/08/2024 12:43:03 AM TECHNIQUE: Multiplanar multisequence MRI of the head/brain was performed with and without the administration of 7 mL gadobutrol  (GADAVIST ) 1 MMOL/ML injection. COMPARISON: MRI of the head dated 06/06/2024. CLINICAL HISTORY: Metastatic disease evaluation; 3T MRI SRS protocol. FINDINGS: BRAIN AND VENTRICLES: No acute infarct. No acute intracranial hemorrhage. No midline shift. No hydrocephalus. The sella is unremarkable. Normal flow voids. An ovoid, avidly enhancing mass is again seen laterally within the left cerebellar hemisphere measuring 2.3 x 2.1 x 2.0 cm, essentially unchanged in the interim. There is moderate adjacent cerebellar swelling with mild mass effect upon the fourth ventricle, as before. There is some blooming artifact along the periphery of the mass. There are no additional lesions  evident. There is moderate diffuse cerebral white matter disease, as before. No abnormal enhancement beyond the described mass. ORBITS: No acute abnormality. SINUSES: There is mild polypoid mucosal disease present within the left sphenoid recess. BONES AND SOFT TISSUES: Normal bone marrow signal and enhancement. No acute soft tissue abnormality. IMPRESSION: 1. Ovoid, avidly enhancing mass in the left cerebellar hemisphere measuring 2.3 x 2.1 x 2.0 cm, essentially unchanged, with moderate adjacent cerebellar swelling and mild mass effect upon the fourth ventricle. 2. No additional lesions evident. 3. Moderate diffuse cerebral white matter disease, as before. Electronically signed by: Evalene Coho MD 06/08/2024 05:00 AM EDT RP Workstation: [id number]   CT CHEST WO CONTRAST Result Date: 06/07/2024 CLINICAL DATA:  Lung cancer.  *Tracking code: [id number]* EXAM: CT CHEST WITHOUT CONTRAST TECHNIQUE: Multidetector CT imaging of the chest was performed following the standard protocol without IV contrast. RADIATION DOSE REDUCTION: This exam was performed according to the departmental dose-optimization program which includes automated exposure control, adjustment of the mA and/or kV according to patient size and/or use of iterative reconstruction technique. COMPARISON:  04/04/2024 FINDINGS: Cardiovascular: Heart is normal size. Aorta is normal caliber. Diffuse coronary artery and aortic atherosclerosis. No evidence of aortic aneurysm. Mediastinum/Nodes: No mediastinal or axillary adenopathy. Evaluation of the hila limited without IV contrast. Trachea and esophagus are unremarkable. Thyroid  unremarkable. Moderate-sized hiatal hernia, stable. Lungs/Pleura: Centrilobular emphysema. Again noted is the left perihilar opacity with associated linear band like areas of extension into the left upper lobe and superior segment of the left lower lobe. These findings are unchanged since prior study and likely reflect post treatment  changes. Small left pleural effusion has slightly increased in size since prior study. Compressive atelectasis in the left lower lobe. Upper Abdomen: No acute findings Musculoskeletal: Chest wall soft tissues are unremarkable. No acute bony abnormality. No suspicious osseous lesion. Diffuse osteopenia and degenerative changes. IMPRESSION: Stable left perihilar opacity with bandlike soft tissue extending into the left upper lobe and superior segment of the left lower lobe. This is stable since prior study and likely reflects post treatment changes. Small left pleural effusion, slightly increased in size since prior study. Diffuse coronary artery disease. Aortic Atherosclerosis (ICD10-I70.0) and Emphysema (ICD10-J43.9). Electronically Signed   By: Franky Crease M.D.   On: 06/07/2024 20:38   MR Brain W and Wo Contrast Result Date:  06/06/2024 EXAM: MRI BRAIN WITH AND WITHOUT CONTRAST 06/06/2024 05:27:13 PM TECHNIQUE: Multiplanar multisequence MRI of the head/brain was performed with and without the administration of 9 mL gadobutrol  (GADAVIST ) 1 MMOL/ML injection. COMPARISON: Same day CT head. CLINICAL HISTORY: edema, mass effect. edema, mass effect edema, mass effect. edema, mass effect FINDINGS: BRAIN AND VENTRICLES: No acute infarct. No acute intracranial hemorrhage. There is a 2.3 x 2.0 x 1.9 cm enhancing mass within the lateral aspect of the left cerebellum. Associated vasogenic edema throughout the left cerebellum with associated mass effect on the fourth ventricle with partial effacement of the left aspect of the fourth ventricle. Edema partially extends into the inferior aspect of the cerebral vermis. The mass abuts the distal transverse and proximal sigmoid sinus on the left. The venous sinuses are patent at this level. There is a small focus of susceptibility within the mass which may reflect small focus of hemorrhage versus mineralization. T2 FLAIR hyperintensity in the periventricular and subcortical white  matter compatible with moderate chronic microvascular ischemic changes. No midline shift. No hydrocephalus. The sella is unremarkable. Normal flow voids. ORBITS: No acute abnormality. SINUSES: Mucosal thickening in the left sphenoid sinus. BONES AND SOFT TISSUES: Normal bone marrow signal and enhancement. No acute soft tissue abnormality. IMPRESSION: 1. 2.3 x 2.0 x 1.9 cm enhancing mass in the lateral aspect of the left cerebellum concerning for metastatic disease. Surrounding vasogenic edema and mass effect on the fourth ventricle. 2. The mass abuts the distal transverse and proximal sigmoid sinus on the left, which remain patent. 3. Moderate chronic microvascular ischemic changes. Electronically signed by: Donnice Mania MD 06/06/2024 06:19 PM EDT RP Workstation: [id number]   CT Head Wo Contrast Result Date: 06/06/2024 EXAM: CT HEAD WITHOUT CONTRAST 06/06/2024 01:09:39 PM TECHNIQUE: CT of the head was performed without the administration of intravenous contrast. Automated exposure control, iterative reconstruction, and/or weight based adjustment of the mA/kV was utilized to reduce the radiation dose to as low as reasonably achievable. COMPARISON: CT head 05/22/2023. CLINICAL HISTORY: ams, vision changes. FINDINGS: BRAIN AND VENTRICLES: No acute hemorrhage. No evidence of acute infarct. There is edema within the left cerebellum with associated mass effect and partial effacement of the 4th ventricle. Findings are most concerning for vasogenic edema in the setting of possible mass lesion. Nonspecific hypoattenuation in the periventricular and subcortical white matter, most likely representing chronic microvascular ischemic changes. Mild parenchymal volume loss. Atherosclerosis of the carotid siphons. No extra-axial collection. ORBITS: No acute abnormality. SINUSES: Chronic left sphenoid sinusitis. SOFT TISSUES AND SKULL: No acute soft tissue abnormality. No skull fracture. IMPRESSION: 1. Edema within the left  cerebellum with associated mass effect and partial effacement of the 4th ventricle, concerning for vasogenic edema in the setting of possible mass lesion. Recommend MRI brain with and without contrast for further evaluation. 2. Mild chronic microvascular ischemic changes and mild parenchymal volume loss. 3. Chronic left sphenoid sinusitis. Electronically signed by: Donnice Mania MD 06/06/2024 01:52 PM EDT RP Workstation: [id number]   CT ABDOMEN PELVIS WO CONTRAST Result Date: 06/06/2024 CLINICAL DATA:  Abdominal pain, nausea, and vomiting EXAM: CT ABDOMEN AND PELVIS WITHOUT CONTRAST TECHNIQUE: Multidetector CT imaging of the abdomen and pelvis was performed following the standard protocol without IV contrast. RADIATION DOSE REDUCTION: This exam was performed according to the departmental dose-optimization program which includes automated exposure control, adjustment of the mA and/or kV according to patient size and/or use of iterative reconstruction technique. COMPARISON:  CT abdomen and pelvis dated 01/30/2024 and multiple priors FINDINGS: Lower  chest: No focal consolidation or pulmonary nodule in the lung bases. Partially imaged loculated left pleural effusion. Partially imaged heart size is normal. Hepatobiliary: No focal hepatic lesions. No intra or extrahepatic biliary ductal dilation. Normal gallbladder. Pancreas: No focal lesions or main ductal dilation. Spleen: Normal in size without focal abnormality. Adrenals/Urinary Tract: No adrenal nodules. Bilateral renal lesions, better evaluated on prior MRI dated 09/27/2022. No hydronephrosis. Nonobstructing left interpolar stone measures 10 mm. Focal mural thickening of the anterior inferior bladder (2:75) not substantially changed dating back to CT PET dated 07/09/2021, where it was not hypermetabolic. This finding may be secondary to inferior bladder herniation. Small cystocele. Stomach/Bowel: Large hiatal hernia. No evidence of bowel wall thickening,  distention, or inflammatory changes. Mild mural thickening of the transverse colon demonstrating mild pericolonic stranding in containing layering fluid levels. Appendix is not discretely seen. Vascular/Lymphatic: Aortic atherosclerosis. No enlarged abdominal or pelvic lymph nodes. Reproductive: No adnexal masses. Other: No free fluid, fluid collection, or free air. Pelvic floor laxity. Musculoskeletal: No acute or abnormal lytic or blastic osseous lesions. Multilevel degenerative changes of the partially imaged thoracic and lumbar spine. Small fat-containing paraumbilical hernia. IMPRESSION: 1. Mild mural thickening of the transverse colon demonstrating mild pericolonic stranding in containing layering fluid levels, suggestive of colitis. 2. Large hiatal hernia. 3. Partially imaged loculated left pleural effusion. 4. Nonobstructing left interpolar renal stone measures 10 mm. 5. Pelvic floor laxity with small cystocele. 6.  Aortic Atherosclerosis (ICD10-I70.0). Electronically Signed   By: Limin  Xu M.D.   On: 06/06/2024 13:43   DG Chest 1 View Result Date: 06/06/2024 CLINICAL DATA:  Chest pain, dizziness, nausea and weakness. Vomiting. EXAM: CHEST  1 VIEW COMPARISON:  01/28/2024 and CT chest 04/04/2024. FINDINGS: Trachea is midline. Heart is enlarged. Thoracic aorta is calcified. Pleuroparenchymal scarring in the left perihilar region and base of the left hemithorax, as on CT chest 04/04/2024. No superimposed airspace consolidation. No definite right pleural fluid. IMPRESSION: 1. No acute findings. 2. 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 Electronically Signed   By: Newell Eke M.D.   On: 06/06/2024 13:06    ASSESSMENT AND PLAN: This is a very pleasant 88 years old white female with metastatic non-small cell lung cancer initially diagnosed as stage IIIA (T2a, N2, M0) non-small cell lung cancer, adenocarcinoma presented with left lower lobe lung mass in addition to left hilar  and mediastinal lymphadenopathy diagnosed in November 2022 with no actionable mutations.  She had evidence of solitary brain metastases in October 2025 with left cerebellar lesion. The patient underwent a course of concurrent chemotherapy with radiation.  Her chemotherapy is in the form of carboplatin  for AUC of 2 and paclitaxel  45 Mg/M2 status post 6 cycles.  She tolerated her treatment with concurrent chemoradiation fairly well except for the radiation-induced odynophagia and dysphagia. Her scan showed significant improvement in her disease with 65% reduction in the volume of the left lower lobe nodule.  She continues to have persistent left hilar and infrahilar adenopathy. The patient completed treatment with consolidation immunotherapy with Imfinzi  1500 Mg IV every 4 weeks.  Status post 13 cycles.  She tolerated her treatment fairly well.  She is currently on observation. She had repeat CT scan of the chest, abdomen and pelvis performed recently.  I personally and independently reviewed the scan and discussed the result with the patient today. Her scan showed no concerning findings for disease recurrence or metastasis. Assessment and Plan Assessment & Plan Metastatic non-small cell lung cancer with  solitary cerebellar brain metastasis An 88 year old female with stage IIIA non-small cell lung cancer, initially diagnosed in November 2022, now progressed to stage IV due to a solitary left cerebellar brain metastasis. She completed concurrent chemoradiation and one year of durvalumab  consolidation therapy by February 2024 and has been on observation since. In October 2025, she developed nausea and near-syncope, leading to the discovery of the brain metastasis. Imaging of the chest, abdomen, and pelvis showed no further metastatic spread. There is concern about potential bone metastasis, but current imaging does not show evidence. The lung cancer is unrelated to her previous skin cancer surgeries. -  Complete current course of stereotactic radiosurgery [facility]) for brain metastasis. - Continue Decadron  as prescribed. - Schedule follow-up in three months with a full CT scan of the chest, abdomen, and pelvis. - Monitor brain metastasis with regular MRI scans as per Dr. Charisse recommendations. - Consider PET scan in the future if there is clinical suspicion of further metastatic spread. She was advised to call immediately if she has any concerning symptoms in the interval.   The patient voices understanding of current disease status and treatment options and is in agreement with the current care plan. The total time spent in the appointment was 30 minutes.  All questions were answered. The patient knows to call the clinic with any problems, questions or concerns. We can certainly see the patient much sooner if necessary.  Disclaimer: This note was dictated with voice recognition software. Similar sounding words can inadvertently be transcribed and may not be corrected upon review.

## 2024-06-21 ENCOUNTER — Telehealth: Payer: Self-pay | Admitting: Internal Medicine

## 2024-06-21 NOTE — Telephone Encounter (Signed)
 Scheduled patient for next appointments. Called and spoke with the patient, she is aware.

## 2024-06-21 NOTE — Radiation Completion Notes (Signed)
 Patient Name: Abigail [name], Abigail [name] MRN: [id number] Date of Birth: [birth year] Referring Physician: Victory Gens, M.D. Date of Service: 2024-06-21 Radiation Oncologist: Lauraine Golden, M.D. [facility]                             RADIATION ONCOLOGY END OF TREATMENT NOTE     Diagnosis: C79.31 Secondary malignant neoplasm of brain Staging on 2021-07-25: Malignant neoplasm of unspecified part of unspecified bronchus or lung (HCC) T=cT2a, N=cN2, M=cM0 Intent: Palliative     ==========DELIVERED PLANS==========  First Treatment Date: 2024-06-14 Last Treatment Date: 2024-06-20   Plan Name: Brain_SRT Site: Brain Technique: SBRT/SRT-IMRT Mode: Photon Dose Per Fraction: 8 Gy Prescribed Dose (Delivered / Prescribed): 24 Gy / 24 Gy Prescribed Fxs (Delivered / Prescribed): 3 / 3     ==========ON TREATMENT VISIT DATES========== 2024-06-14, 2024-06-17, 2024-06-20     ==========UPCOMING VISITS========== 09/19/2024 [facility] ONCOLOGY EST PT 15 Sherrod Sherrod, MD  09/12/2024 [facility] ONCOLOGY LAB ONLY [facility]ON[facility] LAB  08/17/2024 [facility] INT MED PHYSICAL [name], FNP  07/18/2024 [facility] POST TREATMENT CALL [facility]POST TRE[facility]  06/27/2024 [facility] INT MED OFFICE VISIT [name], FNP  06/24/2024 [facility] V[facility]I TELEPHONE CALL 60 Lucky, Melanie A, RN        ==========APPENDIX - ON TREATMENT VISIT NOTES==========   See weekly On Treatment Notes in Epic for details in the Media tab (listed as Progress notes on the On Treatment Visit Dates listed above).

## 2024-06-22 ENCOUNTER — Ambulatory Visit: Admitting: Radiation Oncology

## 2024-06-22 ENCOUNTER — Telehealth: Payer: Self-pay | Admitting: Internal Medicine

## 2024-06-22 NOTE — Telephone Encounter (Signed)
 I was unable to leave voicemail due to mailbox being full. I have mailed a printed reminder to address on file of follow up appointment with Dr. Sherrod in Dec 2025 per staff message.

## 2024-06-23 ENCOUNTER — Other Ambulatory Visit (INDEPENDENT_AMBULATORY_CARE_PROVIDER_SITE_OTHER): Payer: Self-pay | Admitting: Nurse Practitioner

## 2024-06-23 ENCOUNTER — Other Ambulatory Visit: Payer: Self-pay | Admitting: Nurse Practitioner

## 2024-06-23 DIAGNOSIS — C7931 Secondary malignant neoplasm of brain: Secondary | ICD-10-CM | POA: Diagnosis not present

## 2024-06-23 DIAGNOSIS — I700 Atherosclerosis of aorta: Secondary | ICD-10-CM

## 2024-06-23 DIAGNOSIS — C3490 Malignant neoplasm of unspecified part of unspecified bronchus or lung: Secondary | ICD-10-CM | POA: Diagnosis not present

## 2024-06-23 DIAGNOSIS — C7949 Secondary malignant neoplasm of other parts of nervous system: Secondary | ICD-10-CM

## 2024-06-23 DIAGNOSIS — I129 Hypertensive chronic kidney disease with stage 1 through stage 4 chronic kidney disease, or unspecified chronic kidney disease: Secondary | ICD-10-CM

## 2024-06-23 DIAGNOSIS — D630 Anemia in neoplastic disease: Secondary | ICD-10-CM | POA: Diagnosis not present

## 2024-06-23 DIAGNOSIS — N1832 Chronic kidney disease, stage 3b: Secondary | ICD-10-CM | POA: Diagnosis not present

## 2024-06-23 DIAGNOSIS — I130 Hypertensive heart and chronic kidney disease with heart failure and stage 1 through stage 4 chronic kidney disease, or unspecified chronic kidney disease: Secondary | ICD-10-CM | POA: Diagnosis not present

## 2024-06-23 DIAGNOSIS — I5033 Acute on chronic diastolic (congestive) heart failure: Secondary | ICD-10-CM | POA: Diagnosis not present

## 2024-06-23 NOTE — Progress Notes (Signed)
 Chief Complaint  Patient presents with   home health initial    Received home health orders orders from [facility]. Start of care 06/11/2024.   Certification and orders from 06/11/2024 through 08/09/2024 are reviewed, signed and faxed back to home health company.  Need of intermittent skilled services at home: SN, PT, OT, HHA  The home health care plan has been established by me and will be reviewed and updated as needed to maximize patient recovery.  I certify that all home health services have been and will be furnished to the patient while under my care.  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

## 2024-06-23 NOTE — Progress Notes (Signed)
 Chief Complaint  Patient presents with   Home Health   Received home health orders orders from [facility]. Start of care 06/11/2024.   Certification and orders from 06/11/2024 through 08/09/2024 are reviewed, signed and faxed back to home health company.  Need of intermittent skilled services at home: MSW  The home health care plan has been established by me and will be reviewed and updated as needed to maximize patient recovery.  I certify that all home health services have been and will be furnished to the patient while under my care.  Face-to-face encounter in which the need for home health services was established: Dc'd from hospital on 06/10/2024  Patient is receiving home health services for the following diagnoses: Problem List Items Addressed This Visit       Cardiovascular and Mediastinum   Hypertension with renal disease - Primary     [name], FNP

## 2024-06-24 ENCOUNTER — Telehealth: Payer: Self-pay | Admitting: Radiation Therapy

## 2024-06-24 ENCOUNTER — Other Ambulatory Visit: Payer: Self-pay | Admitting: *Deleted

## 2024-06-24 ENCOUNTER — Ambulatory Visit: Payer: Self-pay

## 2024-06-24 ENCOUNTER — Ambulatory Visit: Admitting: Radiation Oncology

## 2024-06-24 ENCOUNTER — Other Ambulatory Visit: Payer: Self-pay | Admitting: Radiation Therapy

## 2024-06-24 DIAGNOSIS — C7931 Secondary malignant neoplasm of brain: Secondary | ICD-10-CM

## 2024-06-24 NOTE — Telephone Encounter (Signed)
 I called to check in on Abigail [name] after the completion of her radiation course. Luckily the Abigail [name] home health nurse was also there. We reviewed her medications together including the steroid taper instructions and medication to treat her thrush. Abigail [name] has been taking these meds correctly. She is doing well, no new issues or concerns. She has my contact information and was encouraged to call with any questions, concerns or changes.   Devere Perch R.T(R)(T) Radiation Special Procedures Lead

## 2024-06-24 NOTE — Patient Instructions (Signed)
 Visit Information  Thank you for taking time to visit with me today. Please don't hesitate to contact me if I can be of assistance to you before our next scheduled telephone appointment.   Following is a copy of your care plan:   Goals Addressed             This Visit's Progress    VBCI Transitions of Care (TOC) Care Plan       Problems:  Recent Hospitalization for treatment of Nausea and Vomiting related to new brain mass Knowledge Deficit Related to plan of care and symptom management  Goal:  Over the next 30 days, the patient will not experience hospital readmission  Interventions:  Transitions of Care: Doctor Visits  - discussed the importance of doctor visits Post discharge activity limitations prescribed by provider reviewed Post-op wound/incision care reviewed with patient/caregiver Reviewed Signs and symptoms of infection Discussed the importance of a calendar to organize appointments and communications with providers-revisited Ensured [facility] involvement, HH RN was in the home today Reviewed upcoming appointments including:07/05/24 with PCP-patient has arranged transportation   Patient Self Care Activities:  Attend all scheduled provider appointments Call pharmacy for medication refills 3-7 days in advance of running out of medications Call provider office for new concerns or questions  Notify RN Care Manager of TOC call rescheduling needs Participate in Transition of Care Program/Attend TOC scheduled calls Take medications as prescribed    Plan:  Telephone follow up appointment with care management team member scheduled for:  07/01/24 at 1 pm        Patient verbalizes understanding of instructions and care plan provided today and agrees to view in MyChart. Active MyChart status and patient understanding of how to access instructions and care plan via MyChart confirmed with patient.     Telephone follow up appointment with care management team member  scheduled for:07/01/24 at 1pm  Please call the care guide team at [phone] if you need to cancel or reschedule your appointment.   Please call 1-800-273-TALK (toll free, 24 hour hotline) go to [facility] Urgent [facility] [address], [place] [phone]) call 911 if you are experiencing a Mental Health or Behavioral Health Crisis or need someone to talk to.  Andrea Dimes RN, BSN [place]  Value-Based [facility] Institute [facility] Health RN Care Manager [phone]

## 2024-06-24 NOTE — Telephone Encounter (Signed)
 FYI Only or Action Required?: Action required by provider: Home Health RN-Lele is needing a call back in regards to patient's wound [phone].  Patient was last seen in primary care on 05/19/2024 by Petrina Pries, NP.  Called Nurse Triage reporting Skin Problem.  Symptoms began today.  Interventions attempted: Rest, hydration, or home remedies.  Symptoms are: gradually worsening.  Triage Disposition: See Physician Within 24 Hours  Patient/caregiver understands and will follow disposition?: No, wishes to speak with PCP  Copied from CRM #[id number]. Topic: Clinical - Red Word Triage >> Jun 24, 2024 11:24 AM Amy B wrote: Red Word that prompted transfer to Nurse Triage: Left leg wound with greenish discharge and pain Reason for Disposition  [1] Pus or cloudy fluid draining from wound AND [2] no fever  Answer Assessment - Initial Assessment Questions BP 172/70. Patient is currently on Macrobid  and Diflucan. Home health RN states current wound orders are a dry dressing once a day but patient is unable to change it everyday. Home Health RN is asking for a call back from the office today in regards to patient's dressing. [phone]  1. LOCATION: Where is the wound located?      Left lower leg where she had cancer removed.  2. WOUND APPEARANCE: What does the wound look like?      Swelling to the area, covered in yellow slough with green drainage.  3. SIZE: If redness is present, ask: What is the size of the red area? (Inches, centimeters, or compare to size of a coin)      No redness but has bruising around the wound 4. SPREAD: What's changed in the last day?  Do you see any red streaks coming from the wound?     Green drainage is new today.  5. ONSET: When did it start to look infected?      Green drainage started today.  6. MECHANISM: How did the wound start, what was the cause?     From cancer removal.  7. PAIN: Do you have any pain?  If Yes, ask: How bad is the  pain?  (e.g., Scale 1-10; mild, moderate, or severe)     4 to 5 out of 10 8. FEVER: Do you have a fever? If Yes, ask: What is your temperature, how was it measured, and when did it start?     no 9. OTHER SYMPTOMS: Do you have any other symptoms? (e.g., shaking chills, weakness, rash elsewhere on body)     Weakness, feeling a little foggy in the head  Protocols used: Wound Infection Suspected-A-AH

## 2024-06-24 NOTE — Transitions of Care (Post Inpatient/ED Visit) (Signed)
 Transition of Care week 2  Visit Note  06/24/2024  Name: Abigail [name] MRN: [id number]          DOB: 02-25-1936  Situation: Patient enrolled in [facility] 30-day program. Visit completed with Ms. Santiago by telephone.   Background:   Initial Transition Care Management Follow-up Telephone Call Discharge Date and Diagnosis: 06/10/24, Nausea & vomiting   Past Medical History:  Diagnosis Date   Anemia    Arthritis    knees, back   Bacteremia 04/08/2012   CAP (community acquired pneumonia) 04/12/2012   Carotid artery occlusion    Chronic kidney disease    stage 3 ckd no nephrologist   Complete uterine prolapse with prolapse of anterior vaginal wall    Complication of anesthesia    hard to wake up per pt   Constipation    Coronary artery disease    Diverticulitis yrs ago coialitis   Dyspnea    History of blood transfusion    History of radiation therapy    right lung 08/05/2021-09/18/2021  Dr Lynwood Nasuti   Hypertension    Hypothyroid    LLQ abdominal pain 04/05/2012   Lung cancer (HCC)    Numbness    in hands at times   Pneumonia    Pre-diabetes    Scoliosis    STEMI (ST elevation myocardial infarction) (HCC) 10/26/2019   DES RCA   Wears dentures    full dentures   Wears glasses    for reading    Assessment: Patient Reported Symptoms: Cognitive Cognitive Status: Able to follow simple commands, Alert and oriented to person, place, and time, Normal speech and language skills      Neurological Neurological Review of Symptoms: Other: [place] Neurological Symptoms/Conditions [RPT]: Patient reports feeling Hazy for a couple days. Neurological Management Strategies: Coping strategies, Routine screening Neurological Self-Management Outcome: 3 (uncertain) Neurological Comment: Completed Radiation on Monday, unsure if feeling hazy is related to radiation or confusion about dexamethasone  dose earlier this week. HH RN has her medications organized now.  HEENT HEENT Symptoms  Reported: Not assessed      Cardiovascular Cardiovascular Symptoms Reported: No symptoms reported Does patient have uncontrolled Hypertension?: Yes Is patient checking Blood Pressure at home?: No Patient's Recent BP reading at home: [facility] RN checked BP today, reading was high Cardiovascular Self-Management Outcome: 3 (uncertain)  Respiratory Respiratory Symptoms Reported: No symptoms reported Additional Respiratory Details: oxygen  at 3L continuously    Endocrine Endocrine Symptoms Reported: No symptoms reported Is patient diabetic?: No    Gastrointestinal Gastrointestinal Symptoms Reported: No symptoms reported Additional Gastrointestinal Details: LBM 06/24/24 Gastrointestinal Self-Management Outcome: 4 (good)    Genitourinary Genitourinary Symptoms Reported: Not assessed    Integumentary Integumentary Symptoms Reported: Wound Additional Integumentary Details: [facility] RN is concerned about left leg wound. She has collaborate with PCP. Skin Management Strategies: Dressing changes, Routine screening, Medication therapy, Adequate rest Skin Self-Management Outcome: 3 (uncertain) Skin Comment: Dressing changed by [facility] RN today. Advised patient to follow up with  Musculoskeletal Musculoskelatal Symptoms Reviewed: Unsteady gait Musculoskeletal Self-Management Outcome: 3 (uncertain) Musculoskeletal Comment: Feeling off balance this week. Has not felt like having HH PT.      Psychosocial Psychosocial Symptoms Reported: Not assessed         There were no vitals filed for this visit.  Medications Reviewed Today     Reviewed by Lucky Andrea LABOR, RN (Registered Nurse) on 06/24/24 at 1423  Med List Status: <None>   Medication Order Taking? Sig Documenting Provider Last Dose  Status Informant  acetaminophen  (TYLENOL ) 500 MG tablet [id number] Yes Take 500 mg by mouth every 6 (six) hours as needed (for leg pain). [provider]  Active Self  albuterol  (VENTOLIN  HFA) 108 (90 Base) MCG/ACT  inhaler [id number] Yes TAKE 2 PUFFS BY MOUTH EVERY 6 HOURS AS NEEDED FOR WHEEZE OR SHORTNESS OF BREATH Heilingoetter, Cassandra L, PA-C  Active Self  aspirin  EC 81 MG tablet [id number] Yes Take 81 mg by mouth at bedtime. [provider]  Active Self  atorvastatin  (LIPITOR ) 80 MG tablet [id number]  Take 80 mg by mouth daily as needed (for cholesterol).  Patient not taking: Reported on 06/24/2024   [provider]  Active Self           Med Note (Jemar Paulsen A   Fri Jun 24, 2024  2:14 PM)    BREZTRI  AEROSPHERE 160-9-4.8 MCG/ACT AERO inhaler [id number] Yes INHALE 2 PUFFS INTO THE LUNGS IN THE MORNING AND ALSO 2 PUFFS AT BEDTIME Shelah Lamar RAMAN, MD  Active Self  dexamethasone  (DECADRON ) 2 MG tablet [id number] Yes Take 4 tablets daily with breakfast through 10/29. Reduce to 2 tablets daily with breakfast from 10/30 through 11/12. Reduce to 1 tablet daily with breakfast from 11/13 through 11/27. Last dose: 11/27. Izell Domino, MD  Active   estradiol  (ESTRACE ) 0.1 MG/GM vaginal cream [id number]  Place 0.5g twice a week at opening of vagina  Patient not taking: Reported on 06/24/2024   Marilynne Rosaline SAILOR, MD  Active Self           Med Note (SATTERFIELD, TEENA FORBES Kitchens Jun 06, 2024  5:07 PM) No specific days   Evolocumab  (REPATHA  SURECLICK) 140 MG/ML SOAJ [id number] Yes Inject 140 mg into the skin every 14 (fourteen) days. Patwardhan, Newman PARAS, MD  Active Self  feeding supplement (ENSURE PLUS HIGH PROTEIN) LIQD [id number] Yes Take 237 mLs by mouth 2 (two) times daily between meals. Arlice Reichert, MD  Active   ferrous sulfate  325 (65 FE) MG EC tablet [id number] Yes TAKE 1 TABLET BY MOUTH EVERY DAY WITH BREAKFAST Heilingoetter, Cassandra L, PA-C  Active Self  fluconazole (DIFLUCAN) 100 MG tablet [id number] Yes Take 2 tablets today, then 1 tablet daily x 13 more days. HOLD ATORVASTATIN  WHILE ON THIS. Izell Domino, MD  Active   HYDROcodone -acetaminophen  (NORCO/VICODIN) 5-325 MG tablet [id number]   Take 1-2 tablets by mouth every 6 (six) hours as needed for moderate pain (pain score 4-6).  Patient not taking: Reported on 06/24/2024   Arlice Reichert, MD  Active   levothyroxine  (SYNTHROID ) 175 MCG tablet [id number] Yes TAKE 1 TABLET BY MOUTH EVERY MORNING ON MONDAY - FRIDAY Moore, [name], FNP  Active Self  metoprolol  succinate (TOPROL -XL) 50 MG 24 hr tablet [id number] Yes Take 50 mg by mouth every evening. [provider]  Active Self  nitrofurantoin  (MACRODANTIN ) 100 MG capsule [id number] Yes Take 100 mg by mouth daily. [provider]  Active Self  nitroGLYCERIN  (NITROSTAT ) 0.4 MG SL tablet [id number] Yes PLACE 1 TABLET UNDER THE TONGUE EVERY 5 (FIVE) MINUTES X 3 DOSES AS NEEDED FOR CHEST PAIN. [name], FNP  Active Self           Med Note ROMELLE MARCHA MALVA Austin Oct 11, 2023 12:10 PM)    OXYGEN  [id number] Yes Inhale 3 L into the lungs continuous. [provider]  Active Self  polyethylene glycol powder (GLYCOLAX /MIRALAX ) 17 GM/SCOOP powder [id number]  Dissolve 1 capful (17g) in 4-8 ounces of liquid  and take by mouth daily as needed for mild constipation.  Patient not taking: Reported on 06/24/2024   Arlice Reichert, MD  Active   trospium  (SANCTURA ) 20 MG tablet [id number] Yes Take 1 tablet (20 mg total) by mouth 2 (two) times daily. Marilynne Rosaline SAILOR, MD  Active Self            Recommendation:   Continue Current Plan of Care  Follow Up Plan:   Telephone follow-up in 1 week  Andrea Dimes RN, BSN [place]  Value-Based Care Institute [facility] Health RN Care Manager [phone]

## 2024-06-27 ENCOUNTER — Encounter: Payer: Self-pay | Admitting: Radiology

## 2024-06-27 ENCOUNTER — Ambulatory Visit: Payer: Self-pay | Admitting: Nurse Practitioner

## 2024-06-27 ENCOUNTER — Other Ambulatory Visit: Payer: Self-pay | Admitting: Physician Assistant

## 2024-06-27 DIAGNOSIS — C3490 Malignant neoplasm of unspecified part of unspecified bronchus or lung: Secondary | ICD-10-CM

## 2024-06-27 NOTE — Telephone Encounter (Signed)
 Patient reports she can't come in before her appt on 11/11 due to transportation. Patient reports she will reach out to her cancer doctor or the doctor who removed the cancer.

## 2024-07-01 ENCOUNTER — Other Ambulatory Visit: Payer: Self-pay

## 2024-07-01 ENCOUNTER — Encounter: Payer: Self-pay | Admitting: Internal Medicine

## 2024-07-01 ENCOUNTER — Other Ambulatory Visit: Payer: Self-pay | Admitting: *Deleted

## 2024-07-01 ENCOUNTER — Telehealth: Payer: Self-pay

## 2024-07-01 NOTE — Patient Instructions (Signed)
 Visit Information  Thank you for taking time to visit with me today. Please don't hesitate to contact me if I can be of assistance to you before our next scheduled telephone appointment.   Following is a copy of your care plan:   Goals Addressed             This Visit's Progress    VBCI Transitions of Care (TOC) Care Plan       Problems:  Recent Hospitalization for treatment of Nausea and Vomiting related to new brain mass Knowledge Deficit Related to plan of care and symptom management  Goal:  Over the next 30 days, the patient will not experience hospital readmission  Interventions:  Transitions of Care: Doctor Visits  - discussed the importance of doctor visits Post discharge activity limitations prescribed by provider reviewed Post-op wound/incision care reviewed with patient/caregiver Reviewed Signs and symptoms of infection Discussed the importance of a calendar to organize appointments and communications with providers-revisited Ensured [facility] involvement with RN and PT Reviewed upcoming appointments including:07/05/24 with PCP-patient has arranged transportation, pick up 11:45am[facility] Discussed the benefits of attending church, getting outside and keeping relationships with friends    Patient Self Care Activities:  Attend all scheduled provider appointments Call pharmacy for medication refills 3-7 days in advance of running out of medications Call provider office for new concerns or questions  Notify RN Care Manager of [facility] call rescheduling needs Participate in Transition of Care Program/Attend TOC scheduled calls Take medications as prescribed    Plan:  Telephone follow up appointment with care management team member scheduled for:  07/08/24 at 1 pm        The patient verbalized understanding of instructions, educational materials, and care plan provided today and agreed to receive a mailed copy of patient instructions, educational  materials, and care plan.   Telephone follow up appointment with care management team member scheduled for:07/08/24 at 1pm  Please call the care guide team at [phone] if you need to cancel or reschedule your appointment.   Please call 1-800-273-TALK (toll free, 24 hour hotline) go to [facility] Urgent [facility] [address] [place] [phone]) call 911 if you are experiencing a Mental Health or Behavioral Health Crisis or need someone to talk to.  Andrea Dimes RN, BSN [place]  Value-Based Care Institute [facility] Health RN Care Manager [phone]

## 2024-07-01 NOTE — Telephone Encounter (Signed)
 Copied from CRM [id number]. Topic: Clinical - Home Health Verbal Orders >> Jun 30, 2024 11:12 AM Leonette SQUIBB wrote: Caller/Agency: jim  with [facility] Number: [phone]   Service Requested: Physical Therapy Pt reported worse than earlier days  fatigued, confused, HR 49/went up to 56.  BP 140/60  130/60 standing,  pt reported increased confusion, maybe medication.  Multiple ensures a day for nutrition, concerned blood sugar,  not using rescue inhaler because of another medication interference,

## 2024-07-01 NOTE — Telephone Encounter (Signed)
 Received a call from Debby Clapp with [facility] want to see what the patients prognosis was. She is seeing the patient today and she is altered mental status and doesn't have any family or caregivers to take care of her. She wanted to know if we had considered Hospice or Palliative care for her. They are working with her on the leg wound to get it cleared up but it is not getting any better. Will get this message to Dr. Sherrod to see how he would want us  to proceed.

## 2024-07-01 NOTE — Transitions of Care (Post Inpatient/ED Visit) (Signed)
 Transition of Care week 3  Visit Note  07/01/2024  Name: Abigail [name] MRN: [id number]          DOB: Aug 08, 1936  Situation: Patient enrolled in [facility] 30-day program. Visit completed with Ms. Lahmann by telephone.   Background:   Initial Transition Care Management Follow-up Telephone Call Discharge Date and Diagnosis: 06/10/24, Nausea & vomiting   Past Medical History:  Diagnosis Date   Anemia    Arthritis    knees, back   Bacteremia 04/08/2012   CAP (community acquired pneumonia) 04/12/2012   Carotid artery occlusion    Chronic kidney disease    stage 3 ckd no nephrologist   Complete uterine prolapse with prolapse of anterior vaginal wall    Complication of anesthesia    hard to wake up per pt   Constipation    Coronary artery disease    Diverticulitis yrs ago coialitis   Dyspnea    History of blood transfusion    History of radiation therapy    right lung 08/05/2021-09/18/2021  Dr Lynwood Nasuti   Hypertension    Hypothyroid    LLQ abdominal pain 04/05/2012   Lung cancer (HCC)    Numbness    in hands at times   Pneumonia    Pre-diabetes    Scoliosis    STEMI (ST elevation myocardial infarction) (HCC) 10/26/2019   DES RCA   Wears dentures    full dentures   Wears glasses    for reading    Assessment: Patient Reported Symptoms: Cognitive Cognitive Status: Able to follow simple commands, Alert and oriented to person, place, and time, Normal speech and language skills (using a calender for managing appointments)      Neurological Neurological Review of Symptoms: Other: [place] Neurological Symptoms/Conditions [RPT]: Patient reports haziness is improving feels it is related to taking dexamethasone . Neurological Management Strategies: Coping strategies, Routine screening, Adequate rest, Medical device Neurological Self-Management Outcome: 3 (uncertain) Neurological Comment: Ambulating with rollator, went to church on Sunday and ladies luncheon on Saturday. The [facility]  picked her up and took her home.  HEENT HEENT Symptoms Reported: No symptoms reported      Cardiovascular Cardiovascular Symptoms Reported: No symptoms reported Does patient have uncontrolled Hypertension?: Yes Is patient checking Blood Pressure at home?: No Patient's Recent BP reading at home: [facility] PT checked BP yesterday, reading was good unable to recall reading Cardiovascular Management Strategies: Medication therapy, Routine screening Weight: 156 lb 9.6 oz (71 kg) Cardiovascular Self-Management Outcome: 3 (uncertain) Cardiovascular Comment: Hospital follow up with PCP on 07/05/24. Patient reports drinking alot of Ensure due to lack of desire to eat solid food.  Respiratory Respiratory Symptoms Reported: No symptoms reported Additional Respiratory Details: oxygen  at 3L continuously Respiratory Management Strategies: Adequate rest, Oxygen  therapy, Routine screening Respiratory Self-Management Outcome: 4 (good)  Endocrine Endocrine Symptoms Reported: No symptoms reported Is patient diabetic?: No    Gastrointestinal Gastrointestinal Symptoms Reported: Change in appetite Additional Gastrointestinal Details: patient reports lack of interest in eating solid foods. She is consuming several Ensure a day. Gastrointestinal Management Strategies: Diet modification, Adequate rest, Coping strategies Gastrointestinal Self-Management Outcome: 3 (uncertain) Gastrointestinal Comment: RNCM discussed eating 3-4 small meals a day and supplementing with Ensure between meals. Patient will work on eating a balanced diet.    Genitourinary Genitourinary Symptoms Reported: No symptoms reported Genitourinary Management Strategies: Medication therapy, Adequate rest Genitourinary Self-Management Outcome: 4 (good)  Integumentary Integumentary Symptoms Reported: Wound Additional Integumentary Details: Patient reports left leg wound is doing much better.  She is performing dressing changes everyother day Skin  Management Strategies: Dressing changes, Coping strategies, Routine screening, Adequate rest Skin Self-Management Outcome: 3 (uncertain) Skin Comment: Patient performing dressing changes everyother day. Weekly visits with [facility] RN.  Musculoskeletal Musculoskelatal Symptoms Reviewed: Unsteady gait Additional Musculoskeletal Details: ambulates with rollator Musculoskeletal Management Strategies: Medication therapy, Adequate rest, Routine screening Musculoskeletal Self-Management Outcome: 3 (uncertain) Musculoskeletal Comment: Attended the women's luncheon on Saturday and church on Sunday      Psychosocial       Quality of Family Relationships:  (Patient's son has not been present recently due to work)   There were no vitals filed for this visit.  Medications Reviewed Today     Reviewed by Lucky Andrea LABOR, RN (Registered Nurse) on 07/01/24 at 1408  Med List Status: <None>   Medication Order Taking? Sig Documenting Provider Last Dose Status Informant  acetaminophen  (TYLENOL ) 500 MG tablet [id number] Yes Take 500 mg by mouth every 6 (six) hours as needed (for leg pain). [provider]  Active Self  albuterol  (VENTOLIN  HFA) 108 (90 Base) MCG/ACT inhaler [id number] Yes TAKE 2 PUFFS BY MOUTH EVERY 6 HOURS AS NEEDED FOR WHEEZE OR SHORTNESS OF BREATH Heilingoetter, Cassandra L, PA-C  Active Self  aspirin  EC 81 MG tablet [id number] Yes Take 81 mg by mouth at bedtime. [provider]  Active Self  atorvastatin  (LIPITOR ) 80 MG tablet [id number]  Take 80 mg by mouth daily as needed (for cholesterol).  Patient not taking: Reported on 07/01/2024   [provider]  Active Self           Med Note (Melodee Lupe A   Fri Jun 24, 2024  2:14 PM)    BREZTRI  AEROSPHERE 160-9-4.8 MCG/ACT AERO inhaler [id number] Yes INHALE 2 PUFFS INTO THE LUNGS IN THE MORNING AND ALSO 2 PUFFS AT BEDTIME Shelah Lamar RAMAN, MD  Active Self  dexamethasone  (DECADRON ) 2 MG tablet [id number] Yes Take 4 tablets daily  with breakfast through 10/29. Reduce to 2 tablets daily with breakfast from 10/30 through 11/12. Reduce to 1 tablet daily with breakfast from 11/13 through 11/27. Last dose: 11/27. Izell Domino, MD  Active   estradiol  (ESTRACE ) 0.1 MG/GM vaginal cream [id number]  Place 0.5g twice a week at opening of vagina  Patient not taking: Reported on 07/01/2024   Marilynne Rosaline SAILOR, MD  Active Self           Med Note (SATTERFIELD, TEENA FORBES Kitchens Jun 06, 2024  5:07 PM) No specific days   Evolocumab  (REPATHA  SURECLICK) 140 MG/ML SOAJ [id number] Yes Inject 140 mg into the skin every 14 (fourteen) days. Patwardhan, Newman PARAS, MD  Active Self  feeding supplement (ENSURE PLUS HIGH PROTEIN) LIQD [id number] Yes Take 237 mLs by mouth 2 (two) times daily between meals. Arlice Reichert, MD  Active   ferrous sulfate  325 (65 FE) MG EC tablet [id number] Yes TAKE 1 TABLET BY MOUTH EVERY DAY WITH BREAKFAST Heilingoetter, Cassandra L, PA-C  Active   fluconazole (DIFLUCAN) 100 MG tablet [id number] Yes Take 2 tablets today, then 1 tablet daily x 13 more days. HOLD ATORVASTATIN  WHILE ON THIS. Izell Domino, MD  Active   HYDROcodone -acetaminophen  (NORCO/VICODIN) 5-325 MG tablet [id number]  Take 1-2 tablets by mouth every 6 (six) hours as needed for moderate pain (pain score 4-6).  Patient not taking: Reported on 07/01/2024   Arlice Reichert, MD  Active   levothyroxine  (SYNTHROID ) 175 MCG tablet [id number] Yes TAKE 1 TABLET BY MOUTH EVERY MORNING ON  MONDAY - DAROLD [name], FNP  Active Self  metoprolol  succinate (TOPROL -XL) 50 MG 24 hr tablet [id number] Yes Take 50 mg by mouth every evening. [provider]  Active Self  nitrofurantoin  (MACRODANTIN ) 100 MG capsule [id number] Yes Take 100 mg by mouth daily. [provider]  Active Self  nitroGLYCERIN  (NITROSTAT ) 0.4 MG SL tablet [id number] Yes PLACE 1 TABLET UNDER THE TONGUE EVERY 5 (FIVE) MINUTES X 3 DOSES AS NEEDED FOR CHEST PAIN. [name], FNP  Active Self            Med Note ROMELLE MARCHA MALVA Austin Oct 11, 2023 12:10 PM)    OXYGEN  [id number] Yes Inhale 3 L into the lungs continuous. [provider]  Active Self  polyethylene glycol powder (GLYCOLAX /MIRALAX ) 17 GM/SCOOP powder [id number]  Dissolve 1 capful (17g) in 4-8 ounces of liquid and take by mouth daily as needed for mild constipation.  Patient not taking: Reported on 07/01/2024   Arlice Reichert, MD  Active   trospium  (SANCTURA ) 20 MG tablet [id number] Yes Take 1 tablet (20 mg total) by mouth 2 (two) times daily. Marilynne Rosaline SAILOR, MD  Active Self            Recommendation:   Continue Current Plan of Care  Follow Up Plan:   Telephone follow-up in 1 week  Andrea Dimes RN, BSN [place]  [facility] RN Care Manager [phone]

## 2024-07-05 ENCOUNTER — Telehealth: Payer: Self-pay

## 2024-07-05 ENCOUNTER — Encounter: Payer: Self-pay | Admitting: Nurse Practitioner

## 2024-07-05 ENCOUNTER — Ambulatory Visit (INDEPENDENT_AMBULATORY_CARE_PROVIDER_SITE_OTHER): Payer: Self-pay | Admitting: Nurse Practitioner

## 2024-07-05 VITALS — BP 114/60 | HR 58 | Temp 97.5°F | Ht 66.0 in | Wt 156.0 lb

## 2024-07-05 DIAGNOSIS — G9389 Other specified disorders of brain: Secondary | ICD-10-CM

## 2024-07-05 DIAGNOSIS — C3490 Malignant neoplasm of unspecified part of unspecified bronchus or lung: Secondary | ICD-10-CM

## 2024-07-05 DIAGNOSIS — Z23 Encounter for immunization: Secondary | ICD-10-CM | POA: Diagnosis not present

## 2024-07-05 DIAGNOSIS — E039 Hypothyroidism, unspecified: Secondary | ICD-10-CM | POA: Diagnosis not present

## 2024-07-05 DIAGNOSIS — T8130XA Disruption of wound, unspecified, initial encounter: Secondary | ICD-10-CM

## 2024-07-05 DIAGNOSIS — R900 Intracranial space-occupying lesion found on diagnostic imaging of central nervous system: Secondary | ICD-10-CM

## 2024-07-05 NOTE — Progress Notes (Signed)
 I,Jameka J Llittleton, CMA,acting as a neurosurgeon for [employer], FNP.,have documented all relevant documentation on the behalf of [name], FNP,as directed by  [name], FNP while in the presence of [name], FNP.  Subjective:  Patient ID: Abigail [name] , female    DOB: 1936/08/13 , 88 y.o.   MRN: [id number]  Chief Complaint  Patient presents with   Follow-up    Patient presents today for a follow up. She is here with her good friend Jordan. She would also [name] for you to look at a sore she has on her leg.     She has had 3 radiation treatment to her brain    Discussed the use of AI scribe software for clinical note transcription with the patient, who gave verbal consent to proceed.  History of Present Illness Abigail [name] is an 88 year old female with a brain tumor who presents for follow-up after recent radiation treatments.  She recently completed three radiation treatments for her brain tumor. The first two treatments were well-tolerated, but the last one caused significant side effects, including feeling 'loopy' and increased leg pain.  She is experiencing insatiable hunger, which she attributes to the Decadron  she is taking to manage brain swelling. Her increased appetite has led her to consume large amounts of food, including items she would not normally eat, such as thick cookies brought by her son, a investment banker, operational.  She has a painful area on her leg, which was surgically treated for cancer removal. The site, described as a 'hole' on her shin bone, has been present for over a month and a half. She has been using Desiger water and gauze to manage the wound. The wound has been painful, and she has been using hydrocodone  for pain management, which makes her feel 'drunk'.  She is receiving home health support from [facility], including nursing care and physical therapy. She mentions having a case worker who has been discussing future care plans with her family, which she finds concerning as  she wants to make her own decisions. She has a strong attachment to her dog, which she considers her 'lifeline'.  She is currently on oxygen  therapy and has concerns about relocating to [place] , where her daughter lives, due to the logistical challenges and her health condition.  Past Medical History:  Diagnosis Date   Anemia    Arthritis    knees, back   Bacteremia 04/08/2012   CAP (community acquired pneumonia) 04/12/2012   Carotid artery occlusion    Chronic kidney disease    stage 3 ckd no nephrologist   Complete uterine prolapse with prolapse of anterior vaginal wall    Complication of anesthesia    hard to wake up per pt   Constipation    Coronary artery disease    Diverticulitis yrs ago coialitis   Dyspnea    History of blood transfusion    History of radiation therapy    right lung 08/05/2021-09/18/2021  Dr Lynwood Nasuti   Hypertension    Hypothyroid    LLQ abdominal pain 04/05/2012   Lung cancer (HCC)    Numbness    in hands at times   Pneumonia    Pre-diabetes    Scoliosis    STEMI (ST elevation myocardial infarction) (HCC) 10/26/2019   DES RCA   Wears dentures    full dentures   Wears glasses    for reading     Family History  Problem Relation Age of Onset  Hypertension Mother    Colon cancer Neg Hx    Stomach cancer Neg Hx    Esophageal cancer Neg Hx      Current Outpatient Medications:    acetaminophen  (TYLENOL ) 500 MG tablet, Take 500 mg by mouth every 6 (six) hours as needed (for leg pain)., Disp: , Rfl:    albuterol  (VENTOLIN  HFA) 108 (90 Base) MCG/ACT inhaler, TAKE 2 PUFFS BY MOUTH EVERY 6 HOURS AS NEEDED FOR WHEEZE OR SHORTNESS OF BREATH, Disp: 8.5 each, Rfl: 1   aspirin  EC 81 MG tablet, Take 81 mg by mouth at bedtime., Disp: , Rfl:    atorvastatin  (LIPITOR ) 80 MG tablet, Take 80 mg by mouth daily as needed (for cholesterol)., Disp: , Rfl:    BREZTRI  AEROSPHERE 160-9-4.8 MCG/ACT AERO inhaler, INHALE 2 PUFFS INTO THE LUNGS IN THE MORNING AND ALSO  2 PUFFS AT BEDTIME, Disp: 10.7 each, Rfl: 6   dexamethasone  (DECADRON ) 2 MG tablet, Take 4 tablets daily with breakfast through 10/29. Reduce to 2 tablets daily with breakfast from 10/30 through 11/12. Reduce to 1 tablet daily with breakfast from 11/13 through 11/27. Last dose: 11/27., Disp: 70 tablet, Rfl: 0   estradiol  (ESTRACE ) 0.1 MG/GM vaginal cream, Place 0.5g twice a week at opening of vagina, Disp: 42.5 g, Rfl: 11   Evolocumab  (REPATHA  SURECLICK) 140 MG/ML SOAJ, Inject 140 mg into the skin every 14 (fourteen) days., Disp: 6 mL, Rfl: 3   feeding supplement (ENSURE PLUS HIGH PROTEIN) LIQD, Take 237 mLs by mouth 2 (two) times daily between meals., Disp: , Rfl:    ferrous sulfate  325 (65 FE) MG EC tablet, TAKE 1 TABLET BY MOUTH EVERY DAY WITH BREAKFAST, Disp: 30 tablet, Rfl: 2   fluconazole  (DIFLUCAN ) 100 MG tablet, Take 2 tablets today, then 1 tablet daily x 13 more days. HOLD ATORVASTATIN  WHILE ON THIS., Disp: 15 tablet, Rfl: 0   HYDROcodone -acetaminophen  (NORCO/VICODIN) 5-325 MG tablet, Take 1-2 tablets by mouth every 6 (six) hours as needed for moderate pain (pain score 4-6)., Disp: 20 tablet, Rfl: 0   levothyroxine  (SYNTHROID ) 175 MCG tablet, TAKE 1 TABLET BY MOUTH EVERY MORNING ON MONDAY - FRIDAY, Disp: 90 tablet, Rfl: 1   metoprolol  succinate (TOPROL -XL) 50 MG 24 hr tablet, Take 50 mg by mouth every evening., Disp: , Rfl:    nitrofurantoin  (MACRODANTIN ) 100 MG capsule, Take 100 mg by mouth daily., Disp: , Rfl:    nitroGLYCERIN  (NITROSTAT ) 0.4 MG SL tablet, PLACE 1 TABLET UNDER THE TONGUE EVERY 5 (FIVE) MINUTES X 3 DOSES AS NEEDED FOR CHEST PAIN., Disp: 25 tablet, Rfl: 3   OXYGEN , Inhale 3 L into the lungs continuous., Disp: , Rfl:    polyethylene glycol powder (GLYCOLAX /MIRALAX ) 17 GM/SCOOP powder, Dissolve 1 capful (17g) in 4-8 ounces of liquid and take by mouth daily as needed for mild constipation., Disp: 238 g, Rfl: 0   silver  sulfADIAZINE  (SILVADENE ) 1 % cream, Apply to affected area  twice a week, Disp: 50 g, Rfl: 1   trospium  (SANCTURA ) 20 MG tablet, Take 1 tablet (20 mg total) by mouth 2 (two) times daily., Disp: 180 tablet, Rfl: 3   Allergies  Allergen Reactions   Codeine Nausea And Vomiting   Norvasc  [Amlodipine ] Rash and Other (See Comments)    rash     Review of Systems  Constitutional: Negative.   Respiratory: Negative.    Cardiovascular: Negative.   Neurological: Negative.   Psychiatric/Behavioral: Negative.       Today's Vitals   07/05/24 1214  BP: 114/60  Pulse: (!) 58  Temp: (!) 97.5 F (36.4 C)  TempSrc: Oral  Weight: 156 lb (70.8 kg)  Height: 5' 6 (1.676 m)  PainSc: 6   PainLoc: Leg   Body mass index is 25.18 kg/m.  Wt Readings from Last 3 Encounters:  07/05/24 156 lb (70.8 kg)  07/01/24 156 lb 9.6 oz (71 kg)  06/20/24 154 lb (69.9 kg)    The ASCVD Risk score (Arnett DK, et al., 2019) failed to calculate for the following reasons:   The 2019 ASCVD risk score is only valid for ages 59 to 61   Risk score cannot be calculated because patient has a medical history suggesting prior/existing ASCVD  Objective:  Physical Exam      . Assessment And Plan:   Assessment & Plan Malignant neoplasm of bronchus and lung (HCC) [C34.90] Continue f/u with Oncology Brain mass Secondary brain neoplasm treated with radiation. Symptoms of 'loopy' feeling and increased hunger likely from steroids. Memory and balance changes possibly due to brain swelling or tumor effects. - Continue Decadron  for brain swelling. - Follow up with oncologist in three months for CT scan of chest, abdomen, and pelvis. - Monitor brain tumor with regular MRIs. - Consider PET scan if further spreading is suspected. - Discuss potential palliative care for symptom management with oncologist. Acquired hypothyroidism Chronic, continue current medications, will make changes pending labs. Wound disruption, initial encounter wound with pain and delayed healing, likely due to  chemotherapy and radiation. Possible infection indicated by green discharge. - Contacted skin surgery center for wound management discussion and potential wound center referral. - Applied wet to dry dressing. - Consulted home health nurse for ongoing wound care. Need for influenza vaccination Influenza vaccine administered Encouraged to take Tylenol  as needed for fever or muscle aches.   Orders Placed This Encounter  Procedures   Flu vaccine HIGH DOSE PF(Fluzone Trivalent)   TSH + free T4     Return if symptoms worsen or fail to improve.  Patient was given opportunity to ask questions. Patient verbalized understanding of the plan and was able to repeat key elements of the plan. All questions were answered to their satisfaction.    LILLETTE [name], FNP, have reviewed all documentation for this visit. The documentation on 07/05/24 for the exam, diagnosis, procedures, and orders are all accurate and complete.   IF YOU HAVE BEEN REFERRED TO A SPECIALIST, IT MAY TAKE 1-2 WEEKS TO SCHEDULE/PROCESS THE REFERRAL. IF YOU HAVE NOT HEARD FROM US /SPECIALIST IN TWO WEEKS, PLEASE GIVE US  A CALL AT [phone].

## 2024-07-05 NOTE — Telephone Encounter (Signed)
 Spoke with Marval from [facility] to follow up on previous message from 07/01/24. [name] reports that the patient has been experiencing intermittent confusion over the past few weeks. She stated that during the PT evaluation on 06/30/24, the patient was mildly confused--mistaking the remote control for a telephone--but after approximately five minutes of conversation, the confusion resolved. [name] further reported that during the PT evaluation on 07/04/24, the patient was alert, oriented, and displayed good energy with no confusion noted.  Inquired with [name] regarding the patient's Decadron  administration. She confirmed that she spoke with nurse Jama Jama, who verified that the patient has been taking Decadron  correctly per the tapered dose schedule. Jama Jama has been filling the patient's pill box weekly to ensure adherence.  Marval also reported that the patient saw her PCP today, who advised the patient that intermittent confusion may occur. Informed [name] that I will reach out to Radiation Oncology for further clarification regarding expected effects from brain radiation or if any additional recommendations. She voiced understanding.

## 2024-07-06 ENCOUNTER — Other Ambulatory Visit: Payer: Self-pay | Admitting: Radiation Therapy

## 2024-07-06 ENCOUNTER — Inpatient Hospital Stay: Attending: Internal Medicine | Admitting: Licensed Clinical Social Worker

## 2024-07-06 DIAGNOSIS — C7931 Secondary malignant neoplasm of brain: Secondary | ICD-10-CM

## 2024-07-06 LAB — TSH+FREE T4
Free T4: 1.05 ng/dL (ref 0.82–1.77)
TSH: 0.17 u[IU]/mL — ABNORMAL LOW (ref 0.450–4.500)

## 2024-07-06 NOTE — Progress Notes (Signed)
 [facility] CSW Progress Note  Clinical Child Psychotherapist contacted patient by phone to follow-up on supportive services.    Interventions: CSW received a referral to contact pt regarding support at home.  Pt reports she resides w/ her son who is a investment banker, operational at the [employer].  He works long hours and offers the support he is able, but is not home most of the time.  Pt's daughter resides in [place] .  Pt has RN services through [facility] twice a week along w/ wound care and is being evaluated for PT services at home.  Pt's son assists w/ groceries and pt has friends who assist as they are able as well.  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  Pt reports she was experiencing some confusion and vertigo while undergoing radiation, but this seems to have mostly resolved.  At this time pt reports no concerns.  Pt provided w/ contact details for CSW should any needs arise.        Follow Up Plan:  Patient will contact CSW with any support or resource needs    Devere JONELLE Manna, LCSW Clinical Social Worker [facility]

## 2024-07-07 ENCOUNTER — Other Ambulatory Visit: Payer: Self-pay

## 2024-07-07 ENCOUNTER — Telehealth: Payer: Self-pay | Admitting: Nurse Practitioner

## 2024-07-07 NOTE — Telephone Encounter (Signed)
 Called to the skin surgery center and spoke with Dr. Robina who did the procedure on her left lower extremity he has advised that there was a referral placed to [facility] wound center with Abigail [name] to care for the wound post.  He advised that he discuss this with Abigail [name] however during her visit she did not mention anything about this.  We will call her tomorrow after we set up an appointment with Abigail and make Abigail [name] aware if she is unable to make that appointment on that day she will be given that number to call and make the appointment for herself.  Dr. Robina feels that she would best be cared for this wound in the wound center due to her inability to do it on her own.  I agree with his recommendation.

## 2024-07-08 ENCOUNTER — Other Ambulatory Visit: Payer: Self-pay | Admitting: *Deleted

## 2024-07-08 NOTE — Telephone Encounter (Signed)
 Patient called back read to her the message to schedule appointment with Harlene Eastern. Patient went to get a pen. And the line got disconnected. Attempted to call the patient back. No answer.

## 2024-07-08 NOTE — Telephone Encounter (Signed)
 Not our pt. KH

## 2024-07-08 NOTE — Patient Instructions (Signed)
 Visit Information  Thank you for taking time to visit with me today. Please don't hesitate to contact me if I can be of assistance to you before our next scheduled telephone appointment.   Following is a copy of your care plan:   Goals Addressed             This Visit's Progress    [facility] Transitions of Care (TOC) Care Plan       Problems:  Recent Hospitalization for treatment of Nausea and Vomiting related to new brain mass Knowledge Deficit Related to plan of care and symptom management  Goal:  Over the next 30 days, the patient will not experience hospital readmission  Interventions:  Transitions of Care: Doctor Visits  - discussed the importance of doctor visits Post discharge activity limitations prescribed by provider reviewed Post-op wound/incision care reviewed with patient/caregiver Reviewed Signs and symptoms of infection Discussed the importance of a calendar to organize appointments and communications with providers-patient using calendar Ensured [facility] involvement with RN twice weekly and PT weekly Reviewed upcoming appointments including:07/18/24-telephone call with Radiation, 07/29/24 at [facility]ound Clinic Discussed the benefits of attending church, getting outside and keeping relationships with friends  Discussed [facility] RN is organizing medications and patient is taking as directed, patient declined medication review today Fall precautions reviewed   Patient Self Care Activities:  Attend all scheduled provider appointments Call pharmacy for medication refills 3-7 days in advance of running out of medications Call provider office for new concerns or questions  Notify RN Care Manager of TOC call rescheduling needs Participate in Transition of Care Program/Attend TOC scheduled calls Take medications as prescribed    Plan:  Telephone follow up appointment with care management team member scheduled for:  07/15/24 at 1 pm        The patient verbalized understanding  of instructions, educational materials, and care plan provided today and agreed to receive a mailed copy of patient instructions, educational materials, and care plan.   Telephone follow up appointment with care management team member scheduled for:07/15/24 at 1pm  Please call the care guide team at [phone] if you need to cancel or reschedule your appointment.   Please call 1-800-273-TALK (toll free, 24 hour hotline) go to [facility] Urgent [facility] [address] [place] [phone]) call 911 if you are experiencing a Mental Health or Behavioral Health Crisis or need someone to talk to.  Andrea Dimes RN, BSN [place]  Value[facility]Based Care [facility] Health RN Care Manager [phone]

## 2024-07-08 NOTE — Transitions of Care (Post Inpatient/ED Visit) (Signed)
 Transition of Care week 4  Visit Note  07/08/2024  Name: Abigail [name] MRN: [id number]          DOB: [birth year]  Situation: Patient enrolled in [facility] 30-day program. Visit completed with Ms. Mcshea by telephone.   Background:   Initial Transition Care Management Follow-up Telephone Call Discharge Date and Diagnosis: 06/10/24, Nausea & vomiting   Past Medical History:  Diagnosis Date   Anemia    Arthritis    knees, back   Bacteremia 04/08/2012   CAP (community acquired pneumonia) 04/12/2012   Carotid artery occlusion    Chronic kidney disease    stage 3 ckd no nephrologist   Complete uterine prolapse with prolapse of anterior vaginal wall    Complication of anesthesia    hard to wake up per pt   Constipation    Coronary artery disease    Diverticulitis yrs ago coialitis   Dyspnea    History of blood transfusion    History of radiation therapy    right lung 08/05/2021-09/18/2021  Dr Lynwood Nasuti   Hypertension    Hypothyroid    LLQ abdominal pain 04/05/2012   Lung cancer (HCC)    Numbness    in hands at times   Pneumonia    Pre-diabetes    Scoliosis    STEMI (ST elevation myocardial infarction) (HCC) 10/26/2019   DES RCA   Wears dentures    full dentures   Wears glasses    for reading    Assessment: Patient Reported Symptoms: Cognitive Cognitive Status: Able to follow simple commands, Alert and oriented to person, place, and time, Normal speech and language skills      Neurological Neurological Review of Symptoms: Other: Oher Neurological Symptoms/Conditions [RPT]: Haziness is not as bad as it was Neurological Management Strategies: Medication therapy, Adequate rest, Routine screening Neurological Self-Management Outcome: 3 (uncertain) Neurological Comment: Ambulating with rollator, attended [place] on Sunday, went grocery shopping and had PCP visit on Monday.  HEENT HEENT Symptoms Reported: Not assessed      Cardiovascular Cardiovascular Symptoms Reported:  No symptoms reported Does patient have uncontrolled Hypertension?: Yes Is patient checking Blood Pressure at home?: No Patient's Recent BP reading at home: [facility] RN checked BP yesterday, It was fine Pt unable to recall reading Cardiovascular Management Strategies: Routine screening, Coping strategies, Adequate rest Cardiovascular Self-Management Outcome: 4 (good)  Respiratory Respiratory Symptoms Reported: No symptoms reported Additional Respiratory Details: oxygen  3L continuously Respiratory Management Strategies: Oxygen  therapy, Routine screening, Adequate rest, Coping strategies Respiratory Self-Management Outcome: 4 (good)  Endocrine Endocrine Symptoms Reported: Not assessed    Gastrointestinal Gastrointestinal Symptoms Reported: Abdominal pain or discomfort Additional Gastrointestinal Details: Lower abdominal pain started today. Tolerated breakfast, has not eaten lunch today. Feeling weak today, Denies N/V/D. Gastrointestinal Management Strategies: Activity Gastrointestinal Self-Management Outcome: 3 (uncertain) Gastrointestinal Comment: RNCM advised using a heating pad to soothe lower abdominal pain.    Genitourinary Genitourinary Symptoms Reported: Incontinence Additional Genitourinary Details: wears briefs, denies pain with urination, fever urgency or frequency Genitourinary Management Strategies: Adequate rest, Medication therapy, Incontinence garment/pad Genitourinary Self-Management Outcome: 4 (good)  Integumentary Integumentary Symptoms Reported: Wound Skin Management Strategies: Activity, Adequate rest, Dressing changes, Routine screening Skin Self-Management Outcome: 4 (good) Skin Comment: My leg is doing a lot better patient denies pain. [facility] RN performing dressing changes twice weekly, patient changes dressing other days. RNCM notified patient of Wound Consult on 07/29/24.  Musculoskeletal Musculoskelatal Symptoms Reviewed: Weakness Additional Musculoskeletal Details:  ambulates with rollator. Patient reports weakess today.  Musculoskeletal Management Strategies: Routine screening, Adequate rest, Activity, Coping strategies Musculoskeletal Self-Management Outcome: 3 (uncertain) Musculoskeletal Comment: Patient is resting today. Attended church on Sunday, appointment with PCP on 07/05/24 and grocery shopping Falls in the past year?: No    Psychosocial Psychosocial Symptoms Reported: Not assessed         There were no vitals filed for this visit. Pain Score: 7  Pain Type: Acute pain Pain Location: Abdomen Pain Orientation: Lower Pain Intervention(s):  (RNCM advised using heat for comfort)  Medications Reviewed Today     Reviewed by Lucky Andrea LABOR, RN (Registered Nurse) on 07/08/24 at 1335  Med List Status: <None>   Medication Order Taking? Sig Documenting Provider Last Dose Status Informant  acetaminophen  (TYLENOL ) 500 MG tablet [id number]  Take 500 mg by mouth every 6 (six) hours as needed (for leg pain). [provider]  Active Self  albuterol  (VENTOLIN  HFA) 108 (90 Base) MCG/ACT inhaler [id number]  TAKE 2 PUFFS BY MOUTH EVERY 6 HOURS AS NEEDED FOR WHEEZE OR SHORTNESS OF BREATH Heilingoetter, Cassandra L, PA-C  Active Self  aspirin  EC 81 MG tablet [id number]  Take 81 mg by mouth at bedtime. [provider]  Active Self  atorvastatin  (LIPITOR ) 80 MG tablet [id number]  Take 80 mg by mouth daily as needed (for cholesterol). [provider]  Active Self           Med Note (Monika Chestang A   Fri Jun 24, 2024  2:14 PM)    BREZTRI  AEROSPHERE 160-9-4.8 MCG/ACT AERO inhaler [id number]  INHALE 2 PUFFS INTO THE LUNGS IN THE MORNING AND ALSO 2 PUFFS AT BEDTIME Shelah Lamar RAMAN, MD  Active Self  dexamethasone  (DECADRON ) 2 MG tablet [id number]  Take 4 tablets daily with breakfast through 10/29. Reduce to 2 tablets daily with breakfast from 10/30 through 11/12. Reduce to 1 tablet daily with breakfast from 11/13 through 11/27. Last dose: 11/27.  Izell Domino, MD  Active   estradiol  (ESTRACE ) 0.1 MG/GM vaginal cream [id number]  Place 0.5g twice a week at opening of vagina Marilynne Rosaline SAILOR, MD  Active Self           Med Note (SATTERFIELD, TEENA FORBES Kitchens Jun 06, 2024  5:07 PM) No specific days   Evolocumab  (REPATHA  SURECLICK) 140 MG/ML SOAJ [id number]  Inject 140 mg into the skin every 14 (fourteen) days. Patwardhan, Newman PARAS, MD  Active Self  feeding supplement (ENSURE PLUS HIGH PROTEIN) LIQD [id number]  Take 237 mLs by mouth 2 (two) times daily between meals. Arlice Reichert, MD  Active   ferrous sulfate  325 (65 FE) MG EC tablet [id number]  TAKE 1 TABLET BY MOUTH EVERY DAY WITH BREAKFAST Heilingoetter, Cassandra L, PA-C  Active   fluconazole (DIFLUCAN) 100 MG tablet [id number]  Take 2 tablets today, then 1 tablet daily x 13 more days. HOLD ATORVASTATIN  WHILE ON THIS. Izell Domino, MD  Active   HYDROcodone -acetaminophen  (NORCO/VICODIN) 5-325 MG tablet [id number]  Take 1-2 tablets by mouth every 6 (six) hours as needed for moderate pain (pain score 4-6). Arlice Reichert, MD  Active   levothyroxine  (SYNTHROID ) 175 MCG tablet [id number]  TAKE 1 TABLET BY MOUTH EVERY MORNING ON MONDAY - FRIDAY Moore, [name], FNP  Active Self  metoprolol  succinate (TOPROL -XL) 50 MG 24 hr tablet [id number]  Take 50 mg by mouth every evening. [provider]  Active Self  nitrofurantoin  (MACRODANTIN ) 100 MG capsule [id number]  Take 100 mg by mouth daily. [provider]  Active Self  nitroGLYCERIN  (NITROSTAT ) 0.4 MG SL tablet [id number]  PLACE 1 TABLET UNDER THE TONGUE EVERY 5 (FIVE) MINUTES X 3 DOSES AS NEEDED FOR CHEST PAIN. [name], FNP  Active Self           Med Note ROMELLE MARCHA MALVA Austin Oct 11, 2023 12:10 PM)    OXYGEN  [id number]  Inhale 3 L into the lungs continuous. [provider]  Active Self  polyethylene glycol powder (GLYCOLAX /MIRALAX ) 17 GM/SCOOP powder [id number]  Dissolve 1 capful (17g) in 4-8 ounces of liquid and take  by mouth daily as needed for mild constipation. Arlice Reichert, MD  Active   trospium  (SANCTURA ) 20 MG tablet [id number]  Take 1 tablet (20 mg total) by mouth 2 (two) times daily. Marilynne Rosaline SAILOR, MD  Active Self            Recommendation:   Continue Current Plan of Care  Follow Up Plan:   Telephone follow-up in 1 week  Andrea Dimes RN, BSN [place]  [facility] RN Care Manager [phone]

## 2024-07-11 ENCOUNTER — Telehealth: Payer: Self-pay | Admitting: Nurse Practitioner

## 2024-07-11 ENCOUNTER — Other Ambulatory Visit: Payer: Self-pay | Admitting: Nurse Practitioner

## 2024-07-11 MED ORDER — SILVER SULFADIAZINE 1 % EX CREA: TOPICAL_CREAM | CUTANEOUS | 1 refills | Status: DC

## 2024-07-11 NOTE — Telephone Encounter (Addendum)
 Bari RN is calling back from [facility] is calling to report that [facility] does not supply the slivadene. Requesting several dosages needed. Or even a tube. Medication would need to be calling into pharmacy. And family would need to pick up the medication. Please advise CB- [phone]

## 2024-07-11 NOTE — Telephone Encounter (Signed)
 Called and spoke with Bari RN - Supervisor Nurse at [facility] and discussed wound care orders. They will do slivadene to the wound bed apply wet gauze covered with dry gauze and wrapped in kerlix 2 times a week. This will get her to the appt with wound care on 07/29/2024

## 2024-07-12 ENCOUNTER — Ambulatory Visit: Payer: Self-pay | Admitting: Nurse Practitioner

## 2024-07-12 NOTE — Telephone Encounter (Signed)
 I have sent a prescription for silvadene  Kindest Regards,   [name], DNP, FNP-BC

## 2024-07-12 NOTE — Telephone Encounter (Signed)
 Bari RN is calling back from [facility] is calling to report that [facility] does not supply the slivadene. Requesting several dosages needed. Or even a tube. Medication would need to be calling into pharmacy. And family would need to pick up the medication. Please advise CB- [phone]

## 2024-07-12 NOTE — Progress Notes (Incomplete)
  Radiation Oncology         [phone] ________________________________  Name: Abigail [name] MRN: [id number]  Date of Service: 07/18/2024  DOB: Nov 05, 1935  Post Treatment Telephone Note  Diagnosis:  Secondary malignant neoplasm of brain   First Treatment Date: 2024-06-14 Last Treatment Date: 2024-06-20   Plan Name: Brain_SRT Site: Brain Technique: SBRT/SRT-IMRT Mode: Photon Dose Per Fraction: 8 Gy Prescribed Dose (Delivered / Prescribed): 24 Gy / 24 Gy Prescribed Fxs (Delivered / Prescribed): 3 / 3    The patient {WAS/WAS NOT:[phone]::was not} available for call today.  The patient {Desc; did/not:3044021} note fatigue during radiation. The patient {Desc; did/not:3044021} note hair loss or skin changes in the field of radiation during therapy. The patient {ACTION; IS/IS WNU:[id number]} taking dexamethasone . The patient {DOES_DOES WNU:81435} have symptoms of  weakness or loss of control of the extremities. The patient {DOES_DOES WNU:81435} have symptoms of headache. The patient {DOES_DOES WNU:81435} have symptoms of seizure or uncontrolled movement. The patient {DOES_DOES WNU:81435} have symptoms of changes in vision. The patient {DOES_DOES WNU:81435} have changes in speech. The patient {DOES_DOES WNU:81435} have confusion.   The patient was counseled that {he/she/they:23295} will be contacted by our brain and spine navigator to schedule surveillance imaging. The patient was encouraged to call if  {he/she/they:23295} have not received a call to schedule imaging, or if {he/she/they:23295} develop concerns or questions regarding radiation. The patient will also continue to follow up with Dr. Sherrod in medical oncology.

## 2024-07-14 ENCOUNTER — Inpatient Hospital Stay: Admitting: Internal Medicine

## 2024-07-14 ENCOUNTER — Inpatient Hospital Stay

## 2024-07-15 ENCOUNTER — Telehealth: Payer: Self-pay | Admitting: *Deleted

## 2024-07-15 ENCOUNTER — Encounter: Payer: Self-pay | Admitting: *Deleted

## 2024-07-17 DIAGNOSIS — C3490 Malignant neoplasm of unspecified part of unspecified bronchus or lung: Secondary | ICD-10-CM | POA: Insufficient documentation

## 2024-07-17 DIAGNOSIS — T8130XA Disruption of wound, unspecified, initial encounter: Secondary | ICD-10-CM | POA: Insufficient documentation

## 2024-07-17 DIAGNOSIS — Z23 Encounter for immunization: Secondary | ICD-10-CM | POA: Insufficient documentation

## 2024-07-17 NOTE — Assessment & Plan Note (Signed)
 Secondary brain neoplasm treated with radiation. Symptoms of 'loopy' feeling and increased hunger likely from steroids. Memory and balance changes possibly due to brain swelling or tumor effects. - Continue Decadron  for brain swelling. - Follow up with oncologist in three months for CT scan of chest, abdomen, and pelvis. - Monitor brain tumor with regular MRIs. - Consider PET scan if further spreading is suspected. - Discuss potential palliative care for symptom management with oncologist.

## 2024-07-17 NOTE — Assessment & Plan Note (Signed)
 Influenza vaccine administered Encouraged to take Tylenol as needed for fever or muscle aches.

## 2024-07-17 NOTE — Assessment & Plan Note (Signed)
 wound with pain and delayed healing, likely due to chemotherapy and radiation. Possible infection indicated by green discharge. - Contacted skin surgery center for wound management discussion and potential wound center referral. - Applied wet to dry dressing. - Consulted home health nurse for ongoing wound care.

## 2024-07-17 NOTE — Assessment & Plan Note (Signed)
 Chronic, continue current medications, will make changes pending labs.

## 2024-07-17 NOTE — Assessment & Plan Note (Signed)
 Continue f/u with Oncology

## 2024-07-18 ENCOUNTER — Ambulatory Visit
Admit: 2024-07-18 | Discharge: 2024-07-18 | Disposition: A | Discharge status: Home / Self Care | Attending: Radiation Oncology | Admitting: Radiation Oncology

## 2024-07-18 DIAGNOSIS — C7931 Secondary malignant neoplasm of brain: Secondary | ICD-10-CM | POA: Insufficient documentation

## 2024-07-18 NOTE — Progress Notes (Signed)
  Radiation Oncology         [phone] ________________________________  Name: Abigail [name] MRN: [id number]  Date of Service: 07/18/2024  DOB: 07-26-1936  Post Treatment Telephone Note  Diagnosis:  Secondary malignant neoplasm of brain   First Treatment Date: 2024-06-14 Last Treatment Date: 2024-06-20   Plan Name: Brain_SRT Site: Brain Technique: SBRT/SRT-IMRT Mode: Photon Dose Per Fraction: 8 Gy Prescribed Dose (Delivered / Prescribed): 24 Gy / 24 Gy Prescribed Fxs (Delivered / Prescribed): 3 / 3  The patient was available for call today.  The patient did note fatigue during radiation. The patient did not note hair loss or skin changes in the field of radiation during therapy. The patient is not taking dexamethasone . The patient does have symptoms of  weakness or loss of control of the extremities. The patient does not have symptoms of headache. The patient does not have symptoms of seizure or uncontrolled movement. The patient does have symptoms of changes in vision. The patient does not have changes in speech. The patient does not have confusion.   The patient was counseled that she will be contacted by our brain and spine navigator to schedule surveillance imaging. The patient was encouraged to call if she have not received a call to schedule imaging, or if she develop concerns or questions regarding radiation. The patient will also continue to follow up with Dr. Sherrod in medical oncology.

## 2024-07-19 ENCOUNTER — Other Ambulatory Visit (HOSPITAL_COMMUNITY): Payer: Self-pay

## 2024-07-19 ENCOUNTER — Telehealth: Payer: Self-pay

## 2024-07-19 ENCOUNTER — Telehealth: Payer: Self-pay | Admitting: *Deleted

## 2024-07-19 NOTE — Transitions of Care (Post Inpatient/ED Visit) (Signed)
 Transition of Care week 5  Visit Note  07/19/2024  Name: Abigail [name] MRN: [id number]          DOB: 01-21-1936  Situation: Patient enrolled in [facility] 30-day program. Visit completed with Ms. Labo by telephone.   Background:   Initial Transition Care Management Follow-up Telephone Call Discharge Date and Diagnosis: No data recorded   Past Medical History:  Diagnosis Date   Anemia    Arthritis    knees, back   Bacteremia 04/08/2012   CAP (community acquired pneumonia) 04/12/2012   Carotid artery occlusion    Chronic kidney disease    stage 3 ckd no nephrologist   Complete uterine prolapse with prolapse of anterior vaginal wall    Complication of anesthesia    hard to wake up per pt   Constipation    Coronary artery disease    Diverticulitis yrs ago coialitis   Dyspnea    History of blood transfusion    History of radiation therapy    right lung 08/05/2021-09/18/2021  Dr Lynwood Nasuti   Hypertension    Hypothyroid    LLQ abdominal pain 04/05/2012   Lung cancer (HCC)    Numbness    in hands at times   Pneumonia    Pre-diabetes    Scoliosis    STEMI (ST elevation myocardial infarction) (HCC) 10/26/2019   DES RCA   Wears dentures    full dentures   Wears glasses    for reading    Assessment: Patient Reported Symptoms: Cognitive Cognitive Status: Able to follow simple commands, Alert and oriented to person, place, and time, Normal speech and language skills      Neurological Neurological Review of Symptoms: No symptoms reported Oher Neurological Symptoms/Conditions [RPT]: Haziness is much better Neurological Management Strategies: Coping strategies, Adequate rest, Medication therapy, Routine screening Neurological Self-Management Outcome: 4 (good)  HEENT HEENT Symptoms Reported: Not assessed      Cardiovascular Cardiovascular Symptoms Reported: No symptoms reported    Respiratory Respiratory Symptoms Reported: No symptoms reported Additional Respiratory  Details: oxygen  3L continuously Respiratory Management Strategies: Oxygen  therapy, Routine screening, Adequate rest Respiratory Self-Management Outcome: 4 (good)  Endocrine Endocrine Symptoms Reported: Not assessed    Gastrointestinal Gastrointestinal Symptoms Reported: Abdominal pain or discomfort Additional Gastrointestinal Details: Lower abdominal pain, regular bowel movements, denies N/V/D. Decreased appetite, drinking ensures all day taking in 3 a day. MOW provides daily meals. I have plenty of food eating peanut butter and wheat thins daily Gastrointestinal Self-Management Outcome: 3 (uncertain) Gastrointestinal Comment: Has not tried heat to her abdomen    Genitourinary Genitourinary Symptoms Reported: No symptoms reported    Integumentary Integumentary Symptoms Reported: Wound Additional Integumentary Details: Patient has not felt like messing with dressing changes. Skin Management Strategies: Adequate rest Skin Self-Management Outcome: 3 (uncertain) Skin Comment: Reviewed wound care consult on 07/29/24. Patient will arrange transportation to this appointment.  Musculoskeletal Musculoskelatal Symptoms Reviewed: Weakness, Limited mobility Additional Musculoskeletal Details: Weakness continues, using rollator for ambulation, unable to complete PT yesterday due to weakness. This medication that I am taking is making my coordination and strength worse Patient referring to dexamethasone . Musculoskeletal Management Strategies: Adequate rest, Medication therapy, Routine screening, Medical device Musculoskeletal Self-Management Outcome: 3 (uncertain) Musculoskeletal Comment: Patient was not able to attend church on Sunday, due to weakness.      Psychosocial Psychosocial Symptoms Reported: Not assessed         There were no vitals filed for this visit. Pain Score: 6  Pain Type: Acute  pain Pain Location: Abdomen Pain Orientation: Lower  Medications Reviewed Today     Reviewed by  Lucky Andrea LABOR, RN (Registered Nurse) on 07/19/24 at 1513  Med List Status: <None>   Medication Order Taking? Sig Documenting Provider Last Dose Status Informant  acetaminophen  (TYLENOL ) 500 MG tablet [id number]  Take 500 mg by mouth every 6 (six) hours as needed (for leg pain). [provider]  Active Self  albuterol  (VENTOLIN  HFA) 108 (90 Base) MCG/ACT inhaler [id number]  TAKE 2 PUFFS BY MOUTH EVERY 6 HOURS AS NEEDED FOR WHEEZE OR SHORTNESS OF BREATH Heilingoetter, Cassandra L, PA-C  Active Self  aspirin  EC 81 MG tablet [id number]  Take 81 mg by mouth at bedtime. [provider]  Active Self  atorvastatin  (LIPITOR ) 80 MG tablet [id number]  Take 80 mg by mouth daily as needed (for cholesterol). [provider]  Active Self           Med Note (Christyanna Mckeon A   Fri Jun 24, 2024  2:14 PM)    BREZTRI  AEROSPHERE 160-9-4.8 MCG/ACT AERO inhaler [id number]  INHALE 2 PUFFS INTO THE LUNGS IN THE MORNING AND ALSO 2 PUFFS AT BEDTIME Shelah Lamar RAMAN, MD  Active Self  dexamethasone  (DECADRON ) 2 MG tablet [id number]  Take 4 tablets daily with breakfast through 10/29. Reduce to 2 tablets daily with breakfast from 10/30 through 11/12. Reduce to 1 tablet daily with breakfast from 11/13 through 11/27. Last dose: 11/27. Izell Domino, MD  Active   estradiol  (ESTRACE ) 0.1 MG/GM vaginal cream [id number]  Place 0.5g twice a week at opening of vagina Marilynne Rosaline SAILOR, MD  Active Self           Med Note (SATTERFIELD, TEENA FORBES Kitchens Jun 06, 2024  5:07 PM) No specific days   Evolocumab  (REPATHA  SURECLICK) 140 MG/ML SOAJ [id number]  Inject 140 mg into the skin every 14 (fourteen) days. Patwardhan, Newman PARAS, MD  Active Self  feeding supplement (ENSURE PLUS HIGH PROTEIN) LIQD [id number] Yes Take 237 mLs by mouth 2 (two) times daily between meals. Arlice Reichert, MD  Active   ferrous sulfate  325 (65 FE) MG EC tablet [id number]  TAKE 1 TABLET BY MOUTH EVERY DAY WITH BREAKFAST Heilingoetter, Cassandra L, PA-C   Active   fluconazole  (DIFLUCAN ) 100 MG tablet [id number]  Take 2 tablets today, then 1 tablet daily x 13 more days. HOLD ATORVASTATIN  WHILE ON THIS. Izell Domino, MD  Active   HYDROcodone -acetaminophen  (NORCO/VICODIN) 5-325 MG tablet [id number]  Take 1-2 tablets by mouth every 6 (six) hours as needed for moderate pain (pain score 4-6). Arlice Reichert, MD  Active   levothyroxine  (SYNTHROID ) 175 MCG tablet [id number]  TAKE 1 TABLET BY MOUTH EVERY MORNING ON MONDAY - FRIDAY Moore, [name], FNP  Active Self  metoprolol  succinate (TOPROL -XL) 50 MG 24 hr tablet [id number]  Take 50 mg by mouth every evening. [provider]  Active Self  nitrofurantoin  (MACRODANTIN ) 100 MG capsule [id number]  Take 100 mg by mouth daily. [provider]  Active Self  nitroGLYCERIN  (NITROSTAT ) 0.4 MG SL tablet [id number]  PLACE 1 TABLET UNDER THE TONGUE EVERY 5 (FIVE) MINUTES X 3 DOSES AS NEEDED FOR CHEST PAIN. [name], FNP  Active Self           Med Note ROMELLE MARCHA MALVA Austin Oct 11, 2023 12:10 PM)    OXYGEN  [id number]  Inhale 3 L into the lungs continuous. [provider]  Active Self  polyethylene glycol powder (  GLYCOLAX /MIRALAX ) 17 GM/SCOOP powder [id number]  Dissolve 1 capful (17g) in 4-8 ounces of liquid and take by mouth daily as needed for mild constipation. Arlice Reichert, MD  Active   silver  sulfADIAZINE  (SILVADENE ) 1 % cream [id number]  Apply to affected area twice a week [name], FNP  Active   trospium  (SANCTURA ) 20 MG tablet [id number]  Take 1 tablet (20 mg total) by mouth 2 (two) times daily. Marilynne Rosaline SAILOR, MD  Active Self            Recommendation:   Continue Current Plan of Care  Follow Up Plan:   Closing From:  Transitions of Care Program Resume with Complex Care Management  Andrea Dimes RN, BSN [place]  [facility] RN Care Manager [phone]

## 2024-07-19 NOTE — Patient Instructions (Signed)
 Visit Information  Thank you for taking time to visit with me today. Please don't hesitate to contact me if I can be of assistance to you before our next scheduled telephone appointment.    Following is a copy of your care plan:   Goals Addressed             This Visit's Progress    COMPLETED: VBCI Transitions of Care (TOC) Care Plan       Problems:  Recent Hospitalization for treatment of Nausea and Vomiting related to new brain mass Knowledge Deficit Related to plan of care and symptom management  Goal:  Over the next 30 days, the patient will not experience hospital readmission  Interventions:  Transitions of Care: Doctor Visits  - discussed the importance of doctor visits Post discharge activity limitations prescribed by provider reviewed Post-op wound/incision care reviewed with patient/caregiver Reviewed Signs and symptoms of infection Discussed setting an alarm as a reminder to take medications Ensured [facility] involvement with RN twice weekly and PT weekly Reviewed upcoming appointments including:[facility]-advised arranging transportation and 08/03/24 with Dr. Gatha Discussed the benefits of attending church, getting outside and keeping relationships with friends  Discussed [facility] RN is organizing medications and patient is taking as directed, patient declined medication review today Fall precautions reviewed Advised patient to contact her insurance plan for member benefits Reviewed dressing change should be done daily, discussed cleaning allowing to air dry, applying prescribed cream and covering   Patient Self Care Activities:  Attend all scheduled provider appointments Call pharmacy for medication refills 3-7 days in advance of running out of medications Call provider office for new concerns or questions  Notify RN Care Manager of [facility] call rescheduling needs Participate in Transition of Care Program/Attend TOC scheduled calls Take medications as  prescribed    Plan:  Telephone follow up appointment with care management team member scheduled for:  resume with CCM on 08/04/24 at 1:30pm with Clayborne, RN        The patient verbalized understanding of instructions, educational materials, and care plan provided today and agreed to receive a mailed copy of patient instructions, educational materials, and care plan.   Telephone follow up appointment with care management team member scheduled for:08/04/24 at 1:30pm with Clayborne, RN  Please call the care guide team at [phone] if you need to cancel or reschedule your appointment.   Please call 1-800-273-TALK (toll free, 24 hour hotline) go to [facility] [address], [place] [phone]) call 911 if you are experiencing a Mental Health or Behavioral Health Crisis or need someone to talk to.  Andrea Dimes RN, BSN [place]  [facility] Health RN Care Manager [phone]

## 2024-07-19 NOTE — Telephone Encounter (Signed)
 Received message from [facility] health nurse that patient was experiencing increased weakness and had not been able to complete PT today.  Dr. Sherrod made aware. No new orders given. MD comfortable with keeping appointments and interventions as is. Hedda RN made aware.  [facility] RN also had questions regarding patient's dexamethasone . Per Dr. Sherrod, patient can stop dexamethasone  since she has completed brain radiation.  RN verbalized an understanding of the information.

## 2024-07-20 ENCOUNTER — Other Ambulatory Visit: Payer: Self-pay | Admitting: Physician Assistant

## 2024-07-20 ENCOUNTER — Encounter: Payer: Self-pay | Admitting: Pharmacist

## 2024-07-20 ENCOUNTER — Other Ambulatory Visit: Payer: Self-pay | Admitting: Pharmacist

## 2024-07-20 DIAGNOSIS — J9610 Chronic respiratory failure, unspecified whether with hypoxia or hypercapnia: Secondary | ICD-10-CM | POA: Diagnosis not present

## 2024-07-20 DIAGNOSIS — I25118 Atherosclerotic heart disease of native coronary artery with other forms of angina pectoris: Secondary | ICD-10-CM

## 2024-07-20 DIAGNOSIS — E782 Mixed hyperlipidemia: Secondary | ICD-10-CM

## 2024-07-20 DIAGNOSIS — C3490 Malignant neoplasm of unspecified part of unspecified bronchus or lung: Secondary | ICD-10-CM | POA: Diagnosis not present

## 2024-07-20 DIAGNOSIS — J449 Chronic obstructive pulmonary disease, unspecified: Secondary | ICD-10-CM | POA: Diagnosis not present

## 2024-07-29 ENCOUNTER — Encounter (HOSPITAL_BASED_OUTPATIENT_CLINIC_OR_DEPARTMENT_OTHER): Admitting: Internal Medicine

## 2024-08-02 ENCOUNTER — Encounter: Payer: Self-pay | Admitting: Internal Medicine

## 2024-08-02 ENCOUNTER — Encounter: Payer: Self-pay | Admitting: Physician Assistant

## 2024-08-03 ENCOUNTER — Inpatient Hospital Stay (HOSPITAL_COMMUNITY)
Admission: EM | Admit: 2024-08-03 | Discharge: 2024-09-03 | DRG: 683 | Disposition: A | Discharge status: Hospice | Attending: Internal Medicine | Admitting: Internal Medicine

## 2024-08-03 ENCOUNTER — Emergency Department (HOSPITAL_COMMUNITY)

## 2024-08-03 ENCOUNTER — Encounter (HOSPITAL_COMMUNITY): Payer: Self-pay | Admitting: *Deleted

## 2024-08-03 ENCOUNTER — Inpatient Hospital Stay: Attending: Internal Medicine

## 2024-08-03 ENCOUNTER — Telehealth: Payer: Self-pay

## 2024-08-03 ENCOUNTER — Other Ambulatory Visit: Payer: Self-pay

## 2024-08-03 ENCOUNTER — Inpatient Hospital Stay: Admitting: Internal Medicine

## 2024-08-03 DIAGNOSIS — K5720 Diverticulitis of large intestine with perforation and abscess without bleeding: Secondary | ICD-10-CM | POA: Diagnosis present

## 2024-08-03 DIAGNOSIS — D630 Anemia in neoplastic disease: Secondary | ICD-10-CM | POA: Diagnosis present

## 2024-08-03 DIAGNOSIS — Z9981 Dependence on supplemental oxygen: Secondary | ICD-10-CM

## 2024-08-03 DIAGNOSIS — Z885 Allergy status to narcotic agent status: Secondary | ICD-10-CM

## 2024-08-03 DIAGNOSIS — I10 Essential (primary) hypertension: Secondary | ICD-10-CM | POA: Diagnosis present

## 2024-08-03 DIAGNOSIS — Z923 Personal history of irradiation: Secondary | ICD-10-CM

## 2024-08-03 DIAGNOSIS — D649 Anemia, unspecified: Secondary | ICD-10-CM | POA: Diagnosis not present

## 2024-08-03 DIAGNOSIS — Z1152 Encounter for screening for COVID-19: Secondary | ICD-10-CM

## 2024-08-03 DIAGNOSIS — F32A Depression, unspecified: Secondary | ICD-10-CM | POA: Diagnosis present

## 2024-08-03 DIAGNOSIS — N179 Acute kidney failure, unspecified: Principal | ICD-10-CM | POA: Diagnosis present

## 2024-08-03 DIAGNOSIS — J449 Chronic obstructive pulmonary disease, unspecified: Secondary | ICD-10-CM | POA: Diagnosis present

## 2024-08-03 DIAGNOSIS — I82501 Chronic embolism and thrombosis of unspecified deep veins of right lower extremity: Secondary | ICD-10-CM | POA: Insufficient documentation

## 2024-08-03 DIAGNOSIS — I82812 Embolism and thrombosis of superficial veins of left lower extremities: Secondary | ICD-10-CM | POA: Diagnosis present

## 2024-08-03 DIAGNOSIS — N136 Pyonephrosis: Secondary | ICD-10-CM | POA: Diagnosis present

## 2024-08-03 DIAGNOSIS — I82403 Acute embolism and thrombosis of unspecified deep veins of lower extremity, bilateral: Secondary | ICD-10-CM | POA: Diagnosis present

## 2024-08-03 DIAGNOSIS — E860 Dehydration: Secondary | ICD-10-CM

## 2024-08-03 DIAGNOSIS — J90 Pleural effusion, not elsewhere classified: Secondary | ICD-10-CM | POA: Diagnosis not present

## 2024-08-03 DIAGNOSIS — I700 Atherosclerosis of aorta: Secondary | ICD-10-CM | POA: Diagnosis present

## 2024-08-03 DIAGNOSIS — R531 Weakness: Secondary | ICD-10-CM | POA: Diagnosis not present

## 2024-08-03 DIAGNOSIS — K5730 Diverticulosis of large intestine without perforation or abscess without bleeding: Secondary | ICD-10-CM | POA: Diagnosis not present

## 2024-08-03 DIAGNOSIS — L89152 Pressure ulcer of sacral region, stage 2: Secondary | ICD-10-CM | POA: Diagnosis not present

## 2024-08-03 DIAGNOSIS — Z79899 Other long term (current) drug therapy: Secondary | ICD-10-CM

## 2024-08-03 DIAGNOSIS — D509 Iron deficiency anemia, unspecified: Secondary | ICD-10-CM | POA: Diagnosis present

## 2024-08-03 DIAGNOSIS — E785 Hyperlipidemia, unspecified: Secondary | ICD-10-CM | POA: Diagnosis present

## 2024-08-03 DIAGNOSIS — Z8249 Family history of ischemic heart disease and other diseases of the circulatory system: Secondary | ICD-10-CM

## 2024-08-03 DIAGNOSIS — R509 Fever, unspecified: Secondary | ICD-10-CM

## 2024-08-03 DIAGNOSIS — I25118 Atherosclerotic heart disease of native coronary artery with other forms of angina pectoris: Secondary | ICD-10-CM | POA: Diagnosis not present

## 2024-08-03 DIAGNOSIS — N1330 Unspecified hydronephrosis: Secondary | ICD-10-CM | POA: Diagnosis not present

## 2024-08-03 DIAGNOSIS — M6281 Muscle weakness (generalized): Secondary | ICD-10-CM | POA: Diagnosis not present

## 2024-08-03 DIAGNOSIS — I252 Old myocardial infarction: Secondary | ICD-10-CM | POA: Diagnosis not present

## 2024-08-03 DIAGNOSIS — C3490 Malignant neoplasm of unspecified part of unspecified bronchus or lung: Secondary | ICD-10-CM | POA: Diagnosis present

## 2024-08-03 DIAGNOSIS — Z7189 Other specified counseling: Secondary | ICD-10-CM | POA: Diagnosis not present

## 2024-08-03 DIAGNOSIS — C7931 Secondary malignant neoplasm of brain: Secondary | ICD-10-CM | POA: Diagnosis present

## 2024-08-03 DIAGNOSIS — N3000 Acute cystitis without hematuria: Secondary | ICD-10-CM | POA: Diagnosis not present

## 2024-08-03 DIAGNOSIS — Z85828 Personal history of other malignant neoplasm of skin: Secondary | ICD-10-CM

## 2024-08-03 DIAGNOSIS — Z9181 History of falling: Secondary | ICD-10-CM

## 2024-08-03 DIAGNOSIS — Z85118 Personal history of other malignant neoplasm of bronchus and lung: Secondary | ICD-10-CM

## 2024-08-03 DIAGNOSIS — I82402 Acute embolism and thrombosis of unspecified deep veins of left lower extremity: Secondary | ICD-10-CM | POA: Diagnosis not present

## 2024-08-03 DIAGNOSIS — Z59868 Other specified financial insecurity: Secondary | ICD-10-CM

## 2024-08-03 DIAGNOSIS — I119 Hypertensive heart disease without heart failure: Secondary | ICD-10-CM | POA: Diagnosis not present

## 2024-08-03 DIAGNOSIS — Z87891 Personal history of nicotine dependence: Secondary | ICD-10-CM

## 2024-08-03 DIAGNOSIS — N390 Urinary tract infection, site not specified: Secondary | ICD-10-CM | POA: Diagnosis not present

## 2024-08-03 DIAGNOSIS — Z7982 Long term (current) use of aspirin: Secondary | ICD-10-CM

## 2024-08-03 DIAGNOSIS — N183 Chronic kidney disease, stage 3 unspecified: Secondary | ICD-10-CM | POA: Diagnosis present

## 2024-08-03 DIAGNOSIS — I2489 Other forms of acute ischemic heart disease: Secondary | ICD-10-CM | POA: Diagnosis present

## 2024-08-03 DIAGNOSIS — L8990 Pressure ulcer of unspecified site, unspecified stage: Secondary | ICD-10-CM | POA: Insufficient documentation

## 2024-08-03 DIAGNOSIS — N134 Hydroureter: Secondary | ICD-10-CM | POA: Diagnosis not present

## 2024-08-03 DIAGNOSIS — Z955 Presence of coronary angioplasty implant and graft: Secondary | ICD-10-CM

## 2024-08-03 DIAGNOSIS — Z87440 Personal history of urinary (tract) infections: Secondary | ICD-10-CM

## 2024-08-03 DIAGNOSIS — N1832 Chronic kidney disease, stage 3b: Secondary | ICD-10-CM | POA: Diagnosis present

## 2024-08-03 DIAGNOSIS — R609 Edema, unspecified: Secondary | ICD-10-CM | POA: Diagnosis not present

## 2024-08-03 DIAGNOSIS — K449 Diaphragmatic hernia without obstruction or gangrene: Secondary | ICD-10-CM | POA: Diagnosis not present

## 2024-08-03 DIAGNOSIS — I129 Hypertensive chronic kidney disease with stage 1 through stage 4 chronic kidney disease, or unspecified chronic kidney disease: Secondary | ICD-10-CM | POA: Diagnosis present

## 2024-08-03 DIAGNOSIS — Z79890 Hormone replacement therapy (postmenopausal): Secondary | ICD-10-CM

## 2024-08-03 DIAGNOSIS — N132 Hydronephrosis with renal and ureteral calculous obstruction: Secondary | ICD-10-CM | POA: Diagnosis not present

## 2024-08-03 DIAGNOSIS — J9611 Chronic respiratory failure with hypoxia: Secondary | ICD-10-CM | POA: Diagnosis present

## 2024-08-03 DIAGNOSIS — Z515 Encounter for palliative care: Secondary | ICD-10-CM | POA: Diagnosis not present

## 2024-08-03 DIAGNOSIS — B965 Pseudomonas (aeruginosa) (mallei) (pseudomallei) as the cause of diseases classified elsewhere: Secondary | ICD-10-CM | POA: Diagnosis present

## 2024-08-03 DIAGNOSIS — Z9221 Personal history of antineoplastic chemotherapy: Secondary | ICD-10-CM

## 2024-08-03 DIAGNOSIS — E039 Hypothyroidism, unspecified: Secondary | ICD-10-CM | POA: Diagnosis present

## 2024-08-03 DIAGNOSIS — Z888 Allergy status to other drugs, medicaments and biological substances status: Secondary | ICD-10-CM

## 2024-08-03 DIAGNOSIS — I824Y2 Acute embolism and thrombosis of unspecified deep veins of left proximal lower extremity: Secondary | ICD-10-CM | POA: Diagnosis not present

## 2024-08-03 DIAGNOSIS — R5381 Other malaise: Secondary | ICD-10-CM | POA: Insufficient documentation

## 2024-08-03 DIAGNOSIS — J410 Simple chronic bronchitis: Secondary | ICD-10-CM | POA: Diagnosis not present

## 2024-08-03 LAB — CBC WITH DIFFERENTIAL/PLATELET
Abs Immature Granulocytes: 0.11 K/uL — ABNORMAL HIGH (ref 0.00–0.07)
Basophils Absolute: 0 K/uL (ref 0.0–0.1)
Basophils Relative: 0 %
Eosinophils Absolute: 0 K/uL (ref 0.0–0.5)
Eosinophils Relative: 0 %
HCT: 22.7 % — ABNORMAL LOW (ref 36.0–46.0)
Hemoglobin: 6.8 g/dL — CL (ref 12.0–15.0)
Immature Granulocytes: 1 %
Lymphocytes Relative: 2 %
Lymphs Abs: 0.2 K/uL — ABNORMAL LOW (ref 0.7–4.0)
MCH: 27 pg (ref 26.0–34.0)
MCHC: 30 g/dL (ref 30.0–36.0)
MCV: 90.1 fL (ref 80.0–100.0)
Monocytes Absolute: 0.7 K/uL (ref 0.1–1.0)
Monocytes Relative: 8 %
Neutro Abs: 7.8 K/uL — ABNORMAL HIGH (ref 1.7–7.7)
Neutrophils Relative %: 89 %
Platelets: 276 K/uL (ref 150–400)
RBC: 2.52 MIL/uL — ABNORMAL LOW (ref 3.87–5.11)
RDW: 16.6 % — ABNORMAL HIGH (ref 11.5–15.5)
WBC: 8.8 K/uL (ref 4.0–10.5)
nRBC: 0 % (ref 0.0–0.2)

## 2024-08-03 LAB — URINALYSIS, ROUTINE W REFLEX MICROSCOPIC
Bilirubin Urine: NEGATIVE
Glucose, UA: NEGATIVE mg/dL
Ketones, ur: NEGATIVE mg/dL
Nitrite: NEGATIVE
Protein, ur: 30 mg/dL — AB
Specific Gravity, Urine: 1.014 (ref 1.005–1.030)
WBC, UA: >50 WBC/hpf (ref 0–5)
pH: 5 (ref 5.0–8.0)

## 2024-08-03 LAB — COMPREHENSIVE METABOLIC PANEL WITH GFR
ALT: 15 U/L (ref 0–44)
AST: 22 U/L (ref 15–41)
Albumin: 3 g/dL — ABNORMAL LOW (ref 3.5–5.0)
Alkaline Phosphatase: 101 U/L (ref 38–126)
Anion gap: 13 (ref 5–15)
BUN: 53 mg/dL — ABNORMAL HIGH (ref 8–23)
CO2: 24 mmol/L (ref 22–32)
Calcium: 8.7 mg/dL — ABNORMAL LOW (ref 8.9–10.3)
Chloride: 99 mmol/L (ref 98–111)
Creatinine, Ser: 2.2 mg/dL — ABNORMAL HIGH (ref 0.44–1.00)
GFR, Estimated: 21 mL/min — ABNORMAL LOW (ref >60)
Glucose, Bld: 148 mg/dL — ABNORMAL HIGH (ref 70–99)
Potassium: 4.3 mmol/L (ref 3.5–5.1)
Sodium: 136 mmol/L (ref 135–145)
Total Bilirubin: 0.3 mg/dL (ref 0.0–1.2)
Total Protein: 6.5 g/dL (ref 6.5–8.1)

## 2024-08-03 LAB — IRON AND TIBC
Iron: 10 ug/dL — ABNORMAL LOW (ref 28–170)
Saturation Ratios: 5 % — ABNORMAL LOW (ref 10.4–31.8)
TIBC: 227 ug/dL — ABNORMAL LOW (ref 250–450)
UIBC: 217 ug/dL

## 2024-08-03 LAB — RESP PANEL BY RT-PCR (RSV, FLU A&B, COVID)  RVPGX2
Influenza A by PCR: NEGATIVE
Influenza B by PCR: NEGATIVE
Resp Syncytial Virus by PCR: NEGATIVE
SARS Coronavirus 2 by RT PCR: NEGATIVE

## 2024-08-03 LAB — TROPONIN T, HIGH SENSITIVITY: Troponin T High Sensitivity: 70 ng/L — ABNORMAL HIGH (ref 0–19)

## 2024-08-03 LAB — PREPARE RBC (CROSSMATCH)

## 2024-08-03 MED ORDER — SODIUM CHLORIDE 0.9 % IV SOLN: 2.0000 g | INTRAVENOUS | Status: DC

## 2024-08-03 MED ORDER — ACETAMINOPHEN 500 MG PO TABS
500.0000 mg | ORAL_TABLET | Freq: Four times a day (QID) | ORAL | Status: DC | PRN
  Administered 2024-08-03 – 2024-08-31 (×18): 500 mg via ORAL
  Filled 2024-08-03 (×14): qty 1

## 2024-08-03 MED ORDER — EVOLOCUMAB 140 MG/ML ~~LOC~~ SOAJ: 1.0000 mL | SUBCUTANEOUS | Status: DC

## 2024-08-03 MED ORDER — POLYETHYLENE GLYCOL 3350 17 G PO PACK
17.0000 g | PACK | Freq: Every day | ORAL | Status: DC | PRN
  Administered 2024-08-07: 17 g via ORAL

## 2024-08-03 MED ORDER — ALBUTEROL SULFATE (2.5 MG/3ML) 0.083% IN NEBU: 2.5000 mg | INHALATION_SOLUTION | Freq: Four times a day (QID) | RESPIRATORY_TRACT | Status: DC | PRN

## 2024-08-03 MED ORDER — IRON SUCROSE 200 MG IVPB - SIMPLE MED
200.0000 mg | Freq: Once | Status: AC
  Administered 2024-08-03: 200 mg via INTRAVENOUS
  Filled 2024-08-03: qty 200

## 2024-08-03 MED ORDER — PANTOPRAZOLE SODIUM 40 MG PO TBEC
40.0000 mg | DELAYED_RELEASE_TABLET | Freq: Every day | ORAL | Status: DC
  Administered 2024-08-03 – 2024-09-03 (×32): 40 mg via ORAL
  Filled 2024-08-03 (×26): qty 1

## 2024-08-03 MED ORDER — SODIUM CHLORIDE 0.9% IV SOLUTION: Freq: Once | INTRAVENOUS | Status: AC

## 2024-08-03 MED ORDER — NITROFURANTOIN MACROCRYSTAL 100 MG PO CAPS: 100.0000 mg | ORAL_CAPSULE | Freq: Every day | ORAL | Status: DC

## 2024-08-03 MED ORDER — BUDESON-GLYCOPYRROL-FORMOTEROL 160-9-4.8 MCG/ACT IN AERO
2.0000 | INHALATION_SPRAY | Freq: Every day | RESPIRATORY_TRACT | Status: DC
  Administered 2024-08-04 – 2024-08-07 (×5): 2 via RESPIRATORY_TRACT
  Filled 2024-08-03: qty 5.9

## 2024-08-03 MED ORDER — SODIUM CHLORIDE 0.9% IV SOLUTION: Freq: Once | INTRAVENOUS | Status: DC

## 2024-08-03 MED ORDER — ATORVASTATIN CALCIUM 40 MG PO TABS: 80.0000 mg | ORAL_TABLET | Freq: Every day | ORAL | Status: DC | PRN

## 2024-08-03 MED ORDER — FERROUS SULFATE 325 (65 FE) MG PO TABS
325.0000 mg | ORAL_TABLET | Freq: Every day | ORAL | Status: DC
  Administered 2024-08-04 – 2024-09-03 (×30): 325 mg via ORAL
  Filled 2024-08-03 (×24): qty 1

## 2024-08-03 MED ORDER — FERROUS SULFATE 325 (65 FE) MG PO TABS: 325.0000 mg | ORAL_TABLET | Freq: Every day | ORAL | Status: DC

## 2024-08-03 MED ORDER — LACTATED RINGERS IV SOLN: INTRAVENOUS | Status: AC

## 2024-08-03 MED ORDER — ATORVASTATIN CALCIUM 40 MG PO TABS
80.0000 mg | ORAL_TABLET | Freq: Every day | ORAL | Status: DC
  Administered 2024-08-03 – 2024-08-09 (×7): 80 mg via ORAL
  Filled 2024-08-03 (×7): qty 2

## 2024-08-03 MED ORDER — LEVOTHYROXINE SODIUM 50 MCG PO TABS
175.0000 ug | ORAL_TABLET | ORAL | Status: DC
  Administered 2024-08-04 – 2024-09-01 (×16): 175 ug via ORAL
  Filled 2024-08-03 (×14): qty 1

## 2024-08-03 MED ORDER — SODIUM CHLORIDE 0.9 % IV SOLN
2.0000 g | INTRAVENOUS | Status: DC
  Administered 2024-08-03 – 2024-08-05 (×3): 2 g via INTRAVENOUS
  Filled 2024-08-03 (×3): qty 20

## 2024-08-03 MED ORDER — SODIUM CHLORIDE 0.9 % IV SOLN: 1.0000 g | INTRAVENOUS | Status: DC

## 2024-08-03 MED ORDER — ENSURE PLUS HIGH PROTEIN PO LIQD
237.0000 mL | Freq: Two times a day (BID) | ORAL | Status: DC
  Administered 2024-08-04: 237 mL via ORAL

## 2024-08-03 MED ORDER — HYDROCODONE-ACETAMINOPHEN 5-325 MG PO TABS
1.0000 | ORAL_TABLET | Freq: Four times a day (QID) | ORAL | Status: DC | PRN
  Administered 2024-08-03 – 2024-08-04 (×2): 2 via ORAL
  Administered 2024-08-04: 1 via ORAL
  Administered 2024-08-05 – 2024-08-08 (×5): 2 via ORAL
  Filled 2024-08-03 (×5): qty 2
  Filled 2024-08-03 (×2): qty 1
  Filled 2024-08-03: qty 2
  Filled 2024-08-03 (×2): qty 1

## 2024-08-03 NOTE — ED Triage Notes (Signed)
 BIB [facility] from home for fatigue and  general weakness. Mentions chronic unrelated abd and L leg pain. H/o brain tumor and receiving radiation. Had appt with ONC today but came to ED instead. VSS. CBG 229. Was sob at home when not on O2, but resolved when EMS returned pt to her baseline 3L [place]. Lives with son, but states he just sleeps here. VS: 138/78, HR 78, RR 16, SPO2 96-100% on 3L. Alert, NAD, calm, interactive, resps e/u, speaking in clear complete sentences, skin W&D. Denies fever, NV. Reports chronic diarrhea.

## 2024-08-03 NOTE — Consult Note (Addendum)
 WOC Nurse Consult Note: Reason for Consult: Consult requested for left leg.  Performed remotely after review of progress notes and photo in the EMR.  Pt is familiar to [facility] team from a previous admission in 8/25 and the full thickness wound to left lower anterior leg is from a previous skin cancer.  Full thickness wound with yellow woundbed and mod amt yellow drainage.   Dressing procedure/placement/frequency: Topical treatment orders provided for bedside nurses to perform as follows to absorb drainage and provide antimicrobial benefits: Cut piece of Aquacel Soila # [phone]) and apply to left leg Q M/W/F and cover with foam dressing.  Change foam dressing Q 3 days or PRN soiling. Moisten previous dressing with NS each time to assist with removal.   Please re-consult if further assistance is needed.   Thank-you,  Stephane Fought MSN, RN, CWOCN, CWCN-AP, CNS Contact Mon-Fri 0700-1500: [phone]

## 2024-08-03 NOTE — Telephone Encounter (Signed)
 Received a call from Marval Sacks with [facility] who states pt is on her way to ED this morning.

## 2024-08-03 NOTE — ED Provider Notes (Signed)
 [place] EMERGENCY DEPARTMENT AT [facility] Provider Note   CSN: [id number] Arrival date & time: 08/03/24  9166     Patient presents with: Fatigue   CLARABEL MARION is a 88 y.o. female.   HPI Patient reports he has been having problems with increasing general weakness since starting radiation therapy on her brain.  She reports today she was trying to get her pants on but ended up getting too weak to dress herself and got on the floor and was unable to get back up.  Patient reports that she has also had a lot of trouble eating since starting radiation therapy.  She thought over the weekend that she was getting dehydrated so she tried to drink more water.  She also reports that she has been having urinary symptoms of first not going much and then going frequently.  She is wondering if she has a urinary tract infection.  She has not had fevers or chills that she is aware of.  No cough no chest pain no shortness of breath.  She reports she chronically has diarrhea for unknown reasons.  Every morning she has loose stool.  Patient has a chronic wound from a skin tumor on her left lower extremity.  She has nursing care twice weekly that comes and does dressing changes for her.  By EMS report patient was not on her home oxygen  which at baseline is 3 L.  When put back on her oxygen  shortness of breath that she complained of at that time resolved.  Vital signs on EMS arrival were stable with blood pressure 138/78 heart rate is 78 oxygen  saturation 96 to 100% on 3 L.    Prior to Admission medications   Medication Sig Start Date End Date Taking? Authorizing Provider  silver  sulfADIAZINE  (SILVADENE ) 1 % cream Apply to affected area twice a week 07/11/24 07/11/25  Moore, [name], FNP  acetaminophen  (TYLENOL ) 500 MG tablet Take 500 mg by mouth every 6 (six) hours as needed (for leg pain).    [provider]  albuterol  (VENTOLIN  HFA) 108 (90 Base) MCG/ACT inhaler TAKE 2 PUFFS BY MOUTH EVERY  6 HOURS AS NEEDED FOR WHEEZE OR SHORTNESS OF BREATH 12/20/21   Heilingoetter, Cassandra L, PA-C  aspirin  EC 81 MG tablet Take 81 mg by mouth at bedtime.    [provider]  atorvastatin  (LIPITOR ) 80 MG tablet Take 80 mg by mouth daily as needed (for cholesterol). 04/15/24   [provider]  BREZTRI  AEROSPHERE 160-9-4.8 MCG/ACT AERO inhaler INHALE 2 PUFFS INTO THE LUNGS IN THE MORNING AND ALSO 2 PUFFS AT BEDTIME 12/08/23   Shelah Lamar RAMAN, MD  dexamethasone  (DECADRON ) 2 MG tablet Take 4 tablets daily with breakfast through 10/29. Reduce to 2 tablets daily with breakfast from 10/30 through 11/12. Reduce to 1 tablet daily with breakfast from 11/13 through 11/27. Last dose: 11/27. 06/14/24   Izell Domino, MD  estradiol  (ESTRACE ) 0.1 MG/GM vaginal cream Place 0.5g twice a week at opening of vagina 10/13/23   Marilynne Rosaline SAILOR, MD  Evolocumab  (REPATHA  SURECLICK) 140 MG/ML SOAJ Inject 140 mg into the skin every 14 (fourteen) days. 04/14/24   Patwardhan, Newman PARAS, MD  feeding supplement (ENSURE PLUS HIGH PROTEIN) LIQD Take 237 mLs by mouth 2 (two) times daily between meals. 06/10/24   Arlice Reichert, MD  ferrous sulfate  325 (65 FE) MG EC tablet TAKE 1 TABLET BY MOUTH EVERY DAY WITH BREAKFAST 06/27/24   Heilingoetter, Cassandra L, PA-C  fluconazole  (DIFLUCAN )  100 MG tablet Take 2 tablets today, then 1 tablet daily x 13 more days. HOLD ATORVASTATIN  WHILE ON THIS. 06/20/24   Izell Domino, MD  HYDROcodone -acetaminophen  (NORCO/VICODIN) 5-325 MG tablet Take 1-2 tablets by mouth every 6 (six) hours as needed for moderate pain (pain score 4-6). 06/10/24   Arlice Reichert, MD  levothyroxine  (SYNTHROID ) 175 MCG tablet TAKE 1 TABLET BY MOUTH EVERY MORNING ON MONDAY - FRIDAY 04/14/24   [name], FNP  metoprolol  succinate (TOPROL -XL) 50 MG 24 hr tablet Take 50 mg by mouth every evening. 04/15/24   [provider]  nitrofurantoin  (MACRODANTIN ) 100 MG capsule Take 100 mg by mouth daily.    [provider]  nitroGLYCERIN  (NITROSTAT ) 0.4 MG SL tablet PLACE 1 TABLET UNDER THE TONGUE EVERY 5 (FIVE) MINUTES X 3 DOSES AS NEEDED FOR CHEST PAIN. 07/28/23   [name], FNP  OXYGEN  Inhale 3 L into the lungs continuous.    [provider]  polyethylene glycol powder (GLYCOLAX /MIRALAX ) 17 GM/SCOOP powder Dissolve 1 capful (17g) in 4-8 ounces of liquid and take by mouth daily as needed for mild constipation. 06/10/24   Arlice Reichert, MD  trospium  (SANCTURA ) 20 MG tablet Take 1 tablet (20 mg total) by mouth 2 (two) times daily. 10/13/23 10/07/24  Marilynne Rosaline SAILOR, MD    Allergies: Codeine and Norvasc  [amlodipine ]    Review of Systems  Updated Vital Signs BP (!) 160/68   Pulse 80   Temp 97.8 F (36.6 C) (Oral)   Resp (!) 21   Wt 70.8 kg   SpO2 100%   BMI 25.18 kg/m   Physical Exam Constitutional:      Comments: Patient is alert.  Nontoxic.  Deconditioned in appearance.  HENT:     Mouth/Throat:     Mouth: Mucous membranes are dry.     Pharynx: Oropharynx is clear.  Eyes:     Extraocular Movements: Extraocular movements intact.     Pupils: Pupils are equal, round, and reactive to light.  Cardiovascular:     Rate and Rhythm: Normal rate and regular rhythm.  Pulmonary:     Effort: Pulmonary effort is normal.     Breath sounds: Normal breath sounds.  Abdominal:     General: There is no distension.     Palpations: Abdomen is soft.     Tenderness: There is no abdominal tenderness. There is no guarding.  Musculoskeletal:     Comments: Patient has about 2+ pitting edema of the right foot.  No active wounds of the right lower extremity.  Left lower extremity has a clean compression Ace wrap over the foot heel and lower leg.  The toes are warm and dry.     (all labs ordered are listed, but only abnormal results are displayed) Labs Reviewed  COMPREHENSIVE METABOLIC PANEL WITH GFR - Abnormal; Notable for the following components:      Result Value   Glucose, Bld 148  (*)    BUN 53 (*)    Creatinine, Ser 2.20 (*)    Calcium  8.7 (*)    Albumin 3.0 (*)    GFR, Estimated 21 (*)    All other components within normal limits  CBC WITH DIFFERENTIAL/PLATELET - Abnormal; Notable for the following components:   RBC 2.52 (*)    Hemoglobin 6.8 (*)    HCT 22.7 (*)    RDW 16.6 (*)    Neutro Abs 7.8 (*)    Lymphs Abs 0.2 (*)    Abs Immature Granulocytes 0.11 (*)  All other components within normal limits  TROPONIN T, HIGH SENSITIVITY - Abnormal; Notable for the following components:   Troponin T High Sensitivity 70 (*)    All other components within normal limits  RESP PANEL BY RT-PCR (RSV, FLU A&B, COVID)  RVPGX2  URINALYSIS, ROUTINE W REFLEX MICROSCOPIC  POC OCCULT BLOOD, ED  TYPE AND SCREEN  PREPARE RBC (CROSSMATCH)  TROPONIN T, HIGH SENSITIVITY    EKG: EKG Interpretation Date/Time:  Wednesday August 03 2024 08:45:55 EST Ventricular Rate:  83 PR Interval:  142 QRS Duration:  154 QT Interval:  382 QTC Calculation: 449 R Axis:   74  Text Interpretation: Sinus rhythm Right bundle branch block Borderline ST depression, lateral leads no sig change from previous Confirmed by Armenta Canning [phone]) on 08/03/2024 11:07:55 AM  Radiology: No results found.   Procedures  CRITICAL CARE Performed by: Canning Armenta   Total critical care time: 30 minutes  Critical care time was exclusive of separately billable procedures and treating other patients.  Critical care was necessary to treat or prevent imminent or life-threatening deterioration.  Critical care was time spent personally by me on the following activities: development of treatment plan with patient and/or surrogate as well as nursing, discussions with consultants, evaluation of patient's response to treatment, examination of patient, obtaining history from patient or surrogate, ordering and performing treatments and interventions, ordering and review of laboratory studies, ordering and  review of radiographic studies, pulse oximetry and re-evaluation of patient's condition.  Medications Ordered in the ED  0.9 %  sodium chloride  infusion (Manually program via Guardrails IV Fluids) (has no administration in time range)    Clinical Course as of 08/03/24 1145  Wed Aug 03, 2024  1143 CBC with Differential(!!) [MP]  1144 Hemoglobin(!!): 6.8 [MP]    Clinical Course User Index [MP] Armenta Canning, MD                                 Medical Decision Making Amount and/or Complexity of Data Reviewed Labs: ordered. Radiology: ordered.  Risk Prescription drug management. Decision regarding hospitalization.  Patient presents as outlined.  She has significant comorbid illness of metastatic lung cancer to the brain.  Patient undergoes palliative radiation therapy for brain mets.  Patient reports that since starting that therapy, she has been getting weaker and having more difficulty managing at home.  She reports today she was trying to get her pants on and got so weak she ended up on the floor and could not get back up and had to call EMS.  He also describes chronic diarrhea and difficulty with oral intake.  Differential diagnosis includes dehydration\infection\t metabolic derangement\complications of lung cancer with brain mets.  Will proceed with broad diagnostic evaluation.  At this time patient's mental status is alert and interactive.  She is not showing signs of confusion or new focal neurologic deficit.  Troponin 70.  White count 8.8.  Hemoglobin 6.8.  Metabolic panel GFR 21.  Respiratory panel negative.  Urinalysis pending.  At baseline patient has anemia in the range of 7.4-9.9.  I suspect in the setting of other comorbid illness and anemia less than 7, patient is symptomatic.  Also probable dehydration with decreased GFR and poor intake.  Patient will require admission.  Packed red blood cells ordered.  Patient's blood pressures and heart rate are stable at this time.   Stable for admission to medical service.  : Dr.Adhikari Triad  hospitalist for admission.     Final diagnoses:  Weakness  Dehydration  Symptomatic anemia    ED Discharge Orders     None          Armenta Canning, MD 08/03/24 1145

## 2024-08-03 NOTE — ED Notes (Signed)
 Will proceed with meds when pharmacy verified.

## 2024-08-03 NOTE — ED Notes (Signed)
 Ask pt for urine and she states she does not need to urinate.  She ask to drink water and she will try after.  I got her water.

## 2024-08-03 NOTE — Telephone Encounter (Signed)
 Copied from CRM #[id number]. Topic: General - Other >> Aug 03, 2024  9:19 AM [name] wrote: Reason for CRM: [name] from [facility] called this morning to advise the patient fell this morning and is at [facility] ED. Abigail [name] states that based on her current status and the level of care needed, it would be more appropriate for her to have hospice care. Their PT team dismissed her last week as she was too weak to continue. [name] just wanted to contact to let NP Georgina know.   Abigail [name] can be reached at [phone] for more information.

## 2024-08-03 NOTE — H&P (Signed)
 History and Physical    Abigail [name] FMW:[id number] DOB: [birth year] DOA: 08/03/2024  PCP: [name], FNP   Patient coming from: Home    Chief Complaint: Fatigue, weakness.  HPI: Abigail [name] is a 88 y.o. female with medical history significant of metastatic non-small lung cancer to brain, currently on radiation treatment, chronic hypoxic respiratory failure on 3 L of oxygen  per minute, CKD stage IIIb, 2 skin cancers on her legs s/p resection, hypertension, coronary artery disease, COPD on 3 L of oxygen  per minute, chronic normocytic anemia, hypothyroidism who presented from home with complaint of generalized weakness.  She was supposed to see her oncologist,Dr Goodhue today.  Felt very weak so she presented to the emergency department.  She reported that she has been weak since starting radiation therapy for her brain metastasis.  She was trying to get her pants on but fell on the floor because she was too weak and was unable to get up.  She was also having poor oral intake at home.  Also reported urinary symptoms and frequency with lower abdominal discomfort.  She reports chronic diarrhea.  She has chronic wound from resection of a skin tumor on her left lower extremity.  She is being followed by wound care nurse at home.  Also reported shortness of breath at home and was not using her supplemental oxygen .   On presentation, she was hemodynamically stable.  Alert and oriented. Patient seen and examined at bedside today.  She was overall comfortable, lying on bed.  Appears weak and deconditioned.  Not in any acute distress. She denied any fever, chills, chest pain, shortness of breath, nausea, vomiting.  She complains of lower abdominal discomfort but her abdomen is mostly soft and nontender.  She denies any hematochezia but says she  is not sure about the color of the stool.  ED Course: Remained hemodynamically stable in the ED.  Mild hypertensive.  Lab work showed creatinine of 2.2(baseline  creatinine of 1.6), hemoglobin of 6.8.  Respiratory viral panel negative.  Started on IV fluid.  Being given a unit of PRBC.  Review of Systems: As per HPI otherwise 10 point review of systems negative.    Past Medical History:  Diagnosis Date   Anemia    Arthritis    knees, back   Bacteremia 04/08/2012   CAP (community acquired pneumonia) 04/12/2012   Carotid artery occlusion    Chronic kidney disease    stage 3 ckd no nephrologist   Complete uterine prolapse with prolapse of anterior vaginal wall    Complication of anesthesia    hard to wake up per pt   Constipation    Coronary artery disease    Diverticulitis yrs ago coialitis   Dyspnea    History of blood transfusion    History of radiation therapy    right lung 08/05/2021-09/18/2021  Dr Lynwood Nasuti   Hypertension    Hypothyroid    LLQ abdominal pain 04/05/2012   Lung cancer (HCC)    Numbness    in hands at times   Pneumonia    Pre-diabetes    Scoliosis    STEMI (ST elevation myocardial infarction) (HCC) 10/26/2019   DES RCA   Wears dentures    full dentures   Wears glasses    for reading    Past Surgical History:  Procedure Laterality Date   ANTERIOR AND POSTERIOR REPAIR WITH SACROSPINOUS FIXATION N/A 12/20/2020   Procedure: SACROSPINOUS LIGAMENT FIXATION;  Surgeon: Marilynne Rosaline SAILOR, MD;  Location: [facility];  Service: Gynecology;  Laterality: N/A;  Total time requested for all procedures is 2 hours   BACK SURGERY  1980 age 76   lower   bartholin cyst removal  age 70's   BLADDER SUSPENSION N/A 12/20/2020   Procedure: TRANSVAGINAL TAPE (TVT) PROCEDURE;  Surgeon: Marilynne Rosaline SAILOR, MD;  Location: [facility];  Service: Gynecology;  Laterality: N/A;   BRONCHIAL BRUSHINGS  07/15/2021   Procedure: BRONCHIAL BRUSHINGS;  Surgeon: Shelah Lamar RAMAN, MD;  Location: [facility] ENDOSCOPY;  Service: Pulmonary;;   BRONCHIAL NEEDLE ASPIRATION BIOPSY  07/15/2021   Procedure: BRONCHIAL NEEDLE  ASPIRATION BIOPSIES;  Surgeon: Shelah Lamar RAMAN, MD;  Location: [facility] ENDOSCOPY;  Service: Pulmonary;;   CORONARY/GRAFT ACUTE MI REVASCULARIZATION N/A 10/26/2019   Procedure: Coronary/Graft Acute MI Revascularization;  Surgeon: Wonda Sharper, MD;  Location: [facility] INVASIVE CV LAB;  Service: Cardiovascular;  Laterality: N/A;   CYSTOCELE REPAIR N/A 12/20/2020   Procedure: ANTERIOR AND POSTERIOR REPAIR WITH PERINEORRHAPHY;  Surgeon: Marilynne Rosaline SAILOR, MD;  Location: [facility];  Service: Gynecology;  Laterality: N/A;   CYSTOSCOPY N/A 12/20/2020   Procedure: CYSTOSCOPY;  Surgeon: Marilynne Rosaline SAILOR, MD;  Location: [facility];  Service: Gynecology;  Laterality: N/A;   ELBOW SURGERY  1990's   left   ENDOBRONCHIAL ULTRASOUND N/A 07/15/2021   Procedure: ENDOBRONCHIAL ULTRASOUND;  Surgeon: Shelah Lamar RAMAN, MD;  Location: [facility] ENDOSCOPY;  Service: Pulmonary;  Laterality: N/A;   HEMOSTASIS CONTROL  07/15/2021   Procedure: HEMOSTASIS CONTROL;  Surgeon: Shelah Lamar RAMAN, MD;  Location: [facility] ENDOSCOPY;  Service: Pulmonary;;   LEFT HEART CATH AND CORONARY ANGIOGRAPHY N/A 10/26/2019   Procedure: LEFT HEART CATH AND CORONARY ANGIOGRAPHY;  Surgeon: Wonda Sharper, MD;  Location: [facility] INVASIVE CV LAB;  Service: Cardiovascular;  Laterality: N/A;   TONSILLECTOMY       reports that she quit smoking about 38 years ago. Her smoking use included cigarettes. She started smoking about 73 years ago. She has a 52.5 pack-year smoking history. She has never used smokeless tobacco. She reports that she does not drink alcohol and does not use drugs.  Allergies  Allergen Reactions   Codeine Nausea And Vomiting   Norvasc  [Amlodipine ] Rash and Other (See Comments)    rash    Family History  Problem Relation Age of Onset   Hypertension Mother    Colon cancer Neg Hx    Stomach cancer Neg Hx    Esophageal cancer Neg Hx      Prior to Admission medications   Medication Sig Start Date End Date  Taking? Authorizing Provider  silver  sulfADIAZINE  (SILVADENE ) 1 % cream Apply to affected area twice a week 07/11/24 07/11/25  [name], FNP  acetaminophen  (TYLENOL ) 500 MG tablet Take 500 mg by mouth every 6 (six) hours as needed (for leg pain).    [provider]  albuterol  (VENTOLIN  HFA) 108 (90 Base) MCG/ACT inhaler TAKE 2 PUFFS BY MOUTH EVERY 6 HOURS AS NEEDED FOR WHEEZE OR SHORTNESS OF BREATH 12/20/21   Heilingoetter, Cassandra L, PA-C  aspirin  EC 81 MG tablet Take 81 mg by mouth at bedtime.    [provider]  atorvastatin  (LIPITOR ) 80 MG tablet Take 80 mg by mouth daily as needed (for cholesterol). 04/15/24   [provider]  BREZTRI  AEROSPHERE 160-9-4.8 MCG/ACT AERO inhaler INHALE 2 PUFFS INTO THE LUNGS IN THE MORNING AND ALSO 2 PUFFS AT BEDTIME 12/08/23   Shelah Lamar RAMAN, MD  dexamethasone  (DECADRON )  2 MG tablet Take 4 tablets daily with breakfast through 10/29. Reduce to 2 tablets daily with breakfast from 10/30 through 11/12. Reduce to 1 tablet daily with breakfast from 11/13 through 11/27. Last dose: 11/27. 06/14/24   Izell Domino, MD  estradiol  (ESTRACE ) 0.1 MG/GM vaginal cream Place 0.5g twice a week at opening of vagina 10/13/23   Marilynne Rosaline SAILOR, MD  Evolocumab  (REPATHA  SURECLICK) 140 MG/ML SOAJ Inject 140 mg into the skin every 14 (fourteen) days. 04/14/24   Patwardhan, Newman PARAS, MD  feeding supplement (ENSURE PLUS HIGH PROTEIN) LIQD Take 237 mLs by mouth 2 (two) times daily between meals. 06/10/24   Arlice Reichert, MD  ferrous sulfate  325 (65 FE) MG EC tablet TAKE 1 TABLET BY MOUTH EVERY DAY WITH BREAKFAST 06/27/24   Heilingoetter, Cassandra L, PA-C  fluconazole  (DIFLUCAN ) 100 MG tablet Take 2 tablets today, then 1 tablet daily x 13 more days. HOLD ATORVASTATIN  WHILE ON THIS. 06/20/24   Izell Domino, MD  HYDROcodone -acetaminophen  (NORCO/VICODIN) 5-325 MG tablet Take 1-2 tablets by mouth every 6 (six) hours as needed for moderate pain (pain score 4-6).  06/10/24   Arlice Reichert, MD  levothyroxine  (SYNTHROID ) 175 MCG tablet TAKE 1 TABLET BY MOUTH EVERY MORNING ON MONDAY - FRIDAY 04/14/24   [name], FNP  metoprolol  succinate (TOPROL -XL) 50 MG 24 hr tablet Take 50 mg by mouth every evening. 04/15/24   [provider]  nitrofurantoin  (MACRODANTIN ) 100 MG capsule Take 100 mg by mouth daily.    [provider]  nitroGLYCERIN  (NITROSTAT ) 0.4 MG SL tablet PLACE 1 TABLET UNDER THE TONGUE EVERY 5 (FIVE) MINUTES X 3 DOSES AS NEEDED FOR CHEST PAIN. 07/28/23   [name], FNP  OXYGEN  Inhale 3 L into the lungs continuous.    [provider]  polyethylene glycol powder (GLYCOLAX /MIRALAX ) 17 GM/SCOOP powder Dissolve 1 capful (17g) in 4-8 ounces of liquid and take by mouth daily as needed for mild constipation. 06/10/24   Arlice Reichert, MD  trospium  (SANCTURA ) 20 MG tablet Take 1 tablet (20 mg total) by mouth 2 (two) times daily. 10/13/23 10/07/24  Marilynne Rosaline SAILOR, MD    Physical Exam: Vitals:   08/03/24 1030 08/03/24 1045 08/03/24 1100 08/03/24 1115  BP:    (!) 147/50  Pulse: 80 82 83 87  Resp: 20 (!) 23 (!) 22 16  Temp:      TempSrc:      SpO2: 100% 97% 96% 99%  Weight:        Constitutional: Pleasant elderly female, weak appearing, deconditioned Vitals:   08/03/24 1030 08/03/24 1045 08/03/24 1100 08/03/24 1115  BP:    (!) 147/50  Pulse: 80 82 83 87  Resp: 20 (!) 23 (!) 22 16  Temp:      TempSrc:      SpO2: 100% 97% 96% 99%  Weight:       Eyes: PERRL, lids and conjunctivae normal ENMT: Mucous membranes are dry Neck: normal, supple, no masses, no thyromegaly Respiratory: clear to auscultation bilaterally, no wheezing, no crackles. Normal respiratory effort. No accessory muscle use.  Cardiovascular: Regular rate and rhythm, no murmurs / rubs / gallops. No extremity edema.  Abdomen: no tenderness, no masses palpated. No hepatosplenomegaly. Bowel sounds positive.  Musculoskeletal: no clubbing / cyanosis. No  joint deformity upper and lower extremities.  Skin: Wound from resection of a skin cancer on the left shin with purulent drainage Neurologic: CN 2-12 grossly intact.  Strength 5/5 in all 4.  Psychiatric: Normal judgment  and insight. Alert and oriented x 3. Normal mood.   Foley Catheter:None  Labs on Admission: I have personally reviewed following labs and imaging studies  CBC: Recent Labs  Lab 08/03/24 0920  WBC 8.8  NEUTROABS 7.8*  HGB 6.8*  HCT 22.7*  MCV 90.1  PLT 276   Basic Metabolic Panel: Recent Labs  Lab 08/03/24 0920  NA 136  K 4.3  CL 99  CO2 24  GLUCOSE 148*  BUN 53*  CREATININE 2.20*  CALCIUM  8.7*   GFR: Estimated Creatinine Clearance: 17.2 mL/min (A) (by C-G formula based on SCr of 2.2 mg/dL (H)). Liver Function Tests: Recent Labs  Lab 08/03/24 0920  AST 22  ALT 15  ALKPHOS 101  BILITOT 0.3  PROT 6.5  ALBUMIN 3.0*   No results for input(s): LIPASE, AMYLASE in the last 168 hours. No results for input(s): AMMONIA in the last 168 hours. Coagulation Profile: No results for input(s): INR, PROTIME in the last 168 hours. Cardiac Enzymes: No results for input(s): CKTOTAL, CKMB, CKMBINDEX, TROPONINI in the last 168 hours. BNP (last 3 results) No results for input(s): PROBNP in the last 8760 hours. HbA1C: No results for input(s): HGBA1C in the last 72 hours. CBG: No results for input(s): GLUCAP in the last 168 hours. Lipid Profile: No results for input(s): CHOL, HDL, LDLCALC, TRIG, CHOLHDL, LDLDIRECT in the last 72 hours. Thyroid  Function Tests: No results for input(s): TSH, T4TOTAL, FREET4, T3FREE, THYROIDAB in the last 72 hours. Anemia Panel: No results for input(s): VITAMINB12, FOLATE, FERRITIN, TIBC, IRON , RETICCTPCT in the last 72 hours. Urine analysis:    Component Value Date/Time   COLORURINE YELLOW 06/06/2024 1617   APPEARANCEUR CLOUDY (A) 06/06/2024 1617   LABSPEC 1.012  06/06/2024 1617   PHURINE 5.0 06/06/2024 1617   GLUCOSEU NEGATIVE 06/06/2024 1617   HGBUR SMALL (A) 06/06/2024 1617   BILIRUBINUR NEGATIVE 06/06/2024 1617   BILIRUBINUR negative 05/19/2024 1427   BILIRUBINUR Negative 04/07/2023 1226   KETONESUR NEGATIVE 06/06/2024 1617   PROTEINUR 30 (A) 06/06/2024 1617   UROBILINOGEN 0.2 05/19/2024 1427   UROBILINOGEN 0.2 04/05/2012 1606   NITRITE POSITIVE (A) 06/06/2024 1617   LEUKOCYTESUR LARGE (A) 06/06/2024 1617    Radiological Exams on Admission: No results found.   Assessment/Plan Principal Problem:   AKI (acute kidney injury) Active Problems:   Urinary tract infection   Hyperlipidemia   Hypothyroidism   CKD (chronic kidney disease), stage III (HCC)   Essential hypertension   Coronary artery disease of native artery of native heart with stable angina pectoris   Malignant neoplasm of unspecified part of unspecified bronchus or lung (HCC)   COPD (chronic obstructive pulmonary disease) (HCC)   Chronic iron  deficiency anemia  AKI on CKD stage IIIb: Baseline creatinine around 1.6.  Presented with creatinine of 2.  Was having poor oral intake at home.  Continue IV fluid.  Monitor renal function.  Avoid nephrotoxins  Metastatic non-small cell lung cancer with solitary cerebral brain metastasis: Follows with Dr. Sherrod.  Completed concurrent chemoradiation and 1 year of Durvalumab  consolidation therapy.  Currently on stereotactic radiosurgery for brain mets.  She completed the course of Decadron .  Follows with radiation oncology.  Will send message to oncology/Dr. Sherrod about her presence here.  Chronic hypoxic respiratory failure/COPD: Currently not on exacerbation.  On 3 L of oxygen  chronically at home.  Continue bronchodilators, inhalers.  Not in acute exacerbation at the moment.  Acute on chronic normocytic anemia: Baseline hemoglobin around 7-8.  Likely associated with  malignancy.  No evidence of acute blood loss.  Denies any  hematochezia .  But she is not aware about her stool color.  Hemoglobin was in the range of 6 on presentation.  Given a unit of PRBC.  Monitor H&H.  Will check FOBT.  Continue Protonix   Hypertension: Takes metoprolol  at home.  Currently on hold  History of coronary artery disease/elevated troponin: On aspirin , Lipitor , Repatha .  No anginal symptoms.  Takes aspirin  at home, currently on hold, will resume on discharge if no evidence of GI bleed. Denies any chest pain today.  Elevated troponin is likely from demand ischemia from anemia.  History of skin cancer: Underwent resection of the skin tumor on the left leg on October of this year.  Wound care consulted.  Wound appears to be slightly infected with purulent material.  Continue ceftriaxone   History of recurrent UTI: On chronic therapy with nitrofurantoin .  Rate noted frequency, dysuria symptoms.  Will check urine culture.  Started on ceftriaxone   Debility/deconditioning/generalized weakness: Patient lives with son.  Will consult PT.Ambulates with rollator.  Goals of care: Elderly patient with multiple comorbidities including metastatic lung cancer.  Will consult palliative care for goals of care.  I have a long discussion with her son [name] on phone today.  He understands her poor quality of life.  He is interested to discuss goals of care.     Severity of Illness: The appropriate patient status for this patient is INPATIENT. I  DVT prophylaxis: SCD Code Status: Full Family Communication: Called and discussed with son [name] on phone on 12/10 Consults called: palliative  care, oncology     Ivonne Mustache MD [facility]  08/03/2024, 12:21 PM

## 2024-08-04 ENCOUNTER — Telehealth: Payer: Self-pay

## 2024-08-04 ENCOUNTER — Inpatient Hospital Stay (HOSPITAL_COMMUNITY)

## 2024-08-04 DIAGNOSIS — J410 Simple chronic bronchitis: Secondary | ICD-10-CM

## 2024-08-04 DIAGNOSIS — C3490 Malignant neoplasm of unspecified part of unspecified bronchus or lung: Secondary | ICD-10-CM | POA: Diagnosis present

## 2024-08-04 DIAGNOSIS — D649 Anemia, unspecified: Secondary | ICD-10-CM | POA: Diagnosis not present

## 2024-08-04 DIAGNOSIS — R531 Weakness: Principal | ICD-10-CM

## 2024-08-04 LAB — TYPE AND SCREEN
ABO/RH(D): B POS
Antibody Screen: NEGATIVE
Unit division: 0

## 2024-08-04 LAB — CBC
HCT: 23.5 % — ABNORMAL LOW (ref 36.0–46.0)
Hemoglobin: 7.3 g/dL — ABNORMAL LOW (ref 12.0–15.0)
MCH: 27.2 pg (ref 26.0–34.0)
MCHC: 31.1 g/dL (ref 30.0–36.0)
MCV: 87.7 fL (ref 80.0–100.0)
Platelets: 240 K/uL (ref 150–400)
RBC: 2.68 MIL/uL — ABNORMAL LOW (ref 3.87–5.11)
RDW: 16.6 % — ABNORMAL HIGH (ref 11.5–15.5)
WBC: 7.4 K/uL (ref 4.0–10.5)
nRBC: 0 % (ref 0.0–0.2)

## 2024-08-04 LAB — BPAM RBC
Blood Product Expiration Date: 202601052359
ISSUE DATE / TIME: 202512102030
Unit Type and Rh: 7300

## 2024-08-04 LAB — BASIC METABOLIC PANEL WITH GFR
Anion gap: 11 (ref 5–15)
BUN: 50 mg/dL — ABNORMAL HIGH (ref 8–23)
CO2: 21 mmol/L — ABNORMAL LOW (ref 22–32)
Calcium: 7.8 mg/dL — ABNORMAL LOW (ref 8.9–10.3)
Chloride: 104 mmol/L (ref 98–111)
Creatinine, Ser: 1.96 mg/dL — ABNORMAL HIGH (ref 0.44–1.00)
GFR, Estimated: 24 mL/min — ABNORMAL LOW (ref >60)
Glucose, Bld: 125 mg/dL — ABNORMAL HIGH (ref 70–99)
Potassium: 4 mmol/L (ref 3.5–5.1)
Sodium: 136 mmol/L (ref 135–145)

## 2024-08-04 LAB — GLUCOSE, CAPILLARY: Glucose-Capillary: 124 mg/dL — ABNORMAL HIGH (ref 70–99)

## 2024-08-04 MED ORDER — IOHEXOL 9 MG/ML PO SOLN
1000.0000 mL | ORAL | Status: AC
  Administered 2024-08-04: 1000 mL via ORAL

## 2024-08-04 MED ORDER — CHLORHEXIDINE GLUCONATE CLOTH 2 % EX PADS
6.0000 | MEDICATED_PAD | Freq: Every day | CUTANEOUS | Status: DC
  Administered 2024-08-05 – 2024-08-12 (×7): 6 via TOPICAL

## 2024-08-04 MED ORDER — LACTATED RINGERS IV SOLN: INTRAVENOUS | Status: AC

## 2024-08-04 MED ORDER — METRONIDAZOLE 500 MG/100ML IV SOLN
500.0000 mg | Freq: Two times a day (BID) | INTRAVENOUS | Status: DC
  Administered 2024-08-04 – 2024-08-08 (×8): 500 mg via INTRAVENOUS
  Filled 2024-08-04 (×8): qty 100

## 2024-08-04 NOTE — Plan of Care (Signed)
°  Problem: Education: Goal: Knowledge of General Education information will improve Description: Including pain rating scale, medication(s)/side effects and non-pharmacologic comfort measures Outcome: Progressing   Problem: Health Behavior/Discharge Planning: Goal: Ability to manage health-related needs will improve Outcome: Progressing   Problem: Clinical Measurements: Goal: Ability to maintain clinical measurements within normal limits will improve Outcome: Progressing Goal: Will remain free from infection Outcome: Progressing Goal: Diagnostic test results will improve Outcome: Progressing Goal: Respiratory complications will improve Outcome: Progressing Goal: Cardiovascular complication will be avoided Outcome: Progressing   Problem: Nutrition: Goal: Adequate nutrition will be maintained Outcome: Progressing   Problem: Coping: Goal: Level of anxiety will decrease Outcome: Progressing   Problem: Elimination: Goal: Will not experience complications related to bowel motility Outcome: Progressing Goal: Will not experience complications related to urinary retention Outcome: Progressing   Problem: Pain Managment: Goal: General experience of comfort will improve and/or be controlled Outcome: Progressing   Problem: Safety: Goal: Ability to remain free from injury will improve Outcome: Progressing   Problem: Skin Integrity: Goal: Risk for impaired skin integrity will decrease Outcome: Progressing   Problem: Activity: Goal: Risk for activity intolerance will decrease 08/04/2024 0215 by Olene Edsel SAILOR, RN Outcome: Not Progressing 08/04/2024 0214 by Olene Edsel SAILOR, RN Outcome: Not Progressing  Pt has been getting weaker for awhile and she is unable to walk.

## 2024-08-04 NOTE — Consult Note (Signed)
 Consultation Note Date: 08/04/2024   Patient Name: Abigail [name]  DOB: [birth year]  MRN: [id number]  Age / Sex: 88 y.o., female  PCP: [name], FNP Referring Physician: Jillian Buttery, MD  Reason for Consultation: Establishing goals of care  HPI/Patient Profile: 88 y.o. female  with past medical history of Non-small lung caner with brain mets admitted on 08/03/2024 with generalized weakness and fatigue.   Clinical Assessment and Goals of Care: Patient remains admitted to hospital medicine service for generalized weakness and fatigue and anemia, she is status post PRBC transfusion. She has been followed by oncology She is status post concurrent chemo and radiation She is status post SBRT for her brain metastatic disease As per oncology recommendations-she is to have repeat imaging in February 2026 Additionally, patient has chronic hypoxic respiratory failure history of COPD she is on oxygen  at home via nasal cannula and has a history of falls Elderly appearing lady resting in bed.  Palliative consult for CODE STATUS and goals of care discussions has been requested. Palliative medicine is specialized medical care for people living with serious illness. It focuses on providing relief from the symptoms and stress of a serious illness. The goal is to improve quality of life for both the patient and the family. Goals of care: Broad aims of medical therapy in relation to the patient's values and preferences. Our aim is to provide medical care aimed at enabling patients to achieve the goals that matter most to them, given the circumstances of their particular medical situation and their constraints.   NEXT OF KIN Daughter.   SUMMARY OF RECOMMENDATIONS   Full Code Full Scope care Recommend palliative support at the cancer center.   Code Status/Advance Care Planning: Full code   Symptom Management:      Palliative Prophylaxis:  Delirium Protocol  Additional Recommendations (Limitations, Scope, Preferences): Full Scope Treatment  Psycho-social/Spiritual:  Desire for further Chaplaincy support:yes Additional Recommendations: Caregiving  Support/Resources  Prognosis:  Unable to determine  Discharge Planning: To Be Determined      Primary Diagnoses: Present on Admission:  AKI (acute kidney injury)  Essential hypertension  Urinary tract infection  Hyperlipidemia  Hypothyroidism  CKD (chronic kidney disease), stage III (HCC)  Coronary artery disease of native artery of native heart with stable angina pectoris  Malignant neoplasm of unspecified part of unspecified bronchus or lung (HCC)  COPD (chronic obstructive pulmonary disease) (HCC)  Chronic iron  deficiency anemia  Symptomatic anemia  Non-small cell lung cancer (HCC)   I have reviewed the medical record, interviewed the patient and family, and examined the patient. The following aspects are pertinent.  Past Medical History:  Diagnosis Date   Anemia    Arthritis    knees, back   Bacteremia 04/08/2012   CAP (community acquired pneumonia) 04/12/2012   Carotid artery occlusion    Chronic kidney disease    stage 3 ckd no nephrologist   Complete uterine prolapse with prolapse of anterior vaginal wall    Complication of anesthesia    hard  to wake up per pt   Constipation    Coronary artery disease    Diverticulitis yrs ago coialitis   Dyspnea    History of blood transfusion    History of radiation therapy    right lung 08/05/2021-09/18/2021  Dr Lynwood Nasuti   Hypertension    Hypothyroid    LLQ abdominal pain 04/05/2012   Lung cancer (HCC)    Numbness    in hands at times   Pneumonia    Pre-diabetes    Scoliosis    STEMI (ST elevation myocardial infarction) (HCC) 10/26/2019   DES RCA   Wears dentures    full dentures   Wears glasses    for reading   Social History   Socioeconomic History   Marital  status: Widowed    Spouse name: Not on file   Number of children: Not on file   Years of education: Not on file   Highest education level: Not on file  Occupational History   Occupation: semi retired  Tobacco Use   Smoking status: Former    Current packs/day: 0.00    Average packs/day: 1.5 packs/day for 35.0 years (52.5 ttl pk-yrs)    Types: Cigarettes    Start date: 04/06/1951    Quit date: 04/05/1986    Years since quitting: 38.3   Smokeless tobacco: Never  Vaping Use   Vaping status: Never Used  Substance and Sexual Activity   Alcohol use: No   Drug use: No   Sexual activity: Not Currently    Birth control/protection: Post-menopausal  Other Topics Concern   Not on file  Social History Narrative   Not on file   Social Drivers of Health   Tobacco Use: Medium Risk (08/03/2024)   Patient History    Smoking Tobacco Use: Former    Smokeless Tobacco Use: Never    Passive Exposure: Not on file  Financial Resource Strain: Medium Risk (03/09/2024)   Overall Financial Resource Strain (CARDIA)    Difficulty of Paying Living Expenses: Somewhat hard  Food Insecurity: No Food Insecurity (08/03/2024)   Epic    Worried About Programme Researcher, Broadcasting/film/video in the Last Year: Never true    Ran Out of Food in the Last Year: Never true  Transportation Needs: No Transportation Needs (08/03/2024)   Epic    Lack of Transportation (Medical): No    Lack of Transportation (Non-Medical): No  Physical Activity: Inactive (03/09/2024)   Exercise Vital Sign    Days of Exercise per Week: 0 days    Minutes of Exercise per Session: 0 min  Stress: No Stress Concern Present (03/09/2024)   [employer] of Occupational Health - Occupational Stress Questionnaire    Feeling of Stress: Only a little  Recent Concern: Stress - Stress Concern Present (03/09/2024)   [employer] of Occupational Health - Occupational Stress Questionnaire    Feeling of Stress: To some extent  Social Connections: Moderately  Isolated (08/03/2024)   Social Connection and Isolation Panel    Frequency of Communication with Friends and Family: More than three times a week    Frequency of Social Gatherings with Friends and Family: Once a week    Attends Religious Services: More than 4 times per year    Active Member of [employer] or Organizations: No    Attends Banker Meetings: Never    Marital Status: Widowed  Depression (PHQ2-9): Low Risk (07/01/2024)   Depression (PHQ2-9)    PHQ-2 Score: 1  Alcohol Screen: Low Risk (  03/09/2024)   Alcohol Screen    Last Alcohol Screening Score (AUDIT): 0  Housing: Low Risk (08/03/2024)   Epic    Unable to Pay for Housing in the Last Year: No    Number of Times Moved in the Last Year: 0    Homeless in the Last Year: No  Utilities: Not At Risk (08/03/2024)   Epic    Threatened with loss of utilities: No  Health Literacy: Adequate Health Literacy (03/09/2024)   B1300 Health Literacy    Frequency of need for help with medical instructions: Never   Family History  Problem Relation Age of Onset   Hypertension Mother    Colon cancer Neg Hx    Stomach cancer Neg Hx    Esophageal cancer Neg Hx    Scheduled Meds:  atorvastatin   80 mg Oral Daily   budesonide -glycopyrrolate -formoterol   2 puff Inhalation Daily   feeding supplement  237 mL Oral BID BM   ferrous sulfate   325 mg Oral Q breakfast   levothyroxine   175 mcg Oral Once per day on Monday Tuesday Wednesday Thursday   pantoprazole   40 mg Oral Daily   Continuous Infusions:  cefTRIAXone  (ROCEPHIN )  IV Stopped (08/03/24 1523)   lactated ringers      PRN Meds:.acetaminophen , albuterol , HYDROcodone -acetaminophen , polyethylene glycol Medications Prior to Admission:  Prior to Admission medications  Medication Sig Start Date End Date Taking? Authorizing Provider  acetaminophen  (TYLENOL ) 500 MG tablet Take 1,000 mg by mouth every 6 (six) hours as needed (for pain).   Yes [provider]  albuterol  (VENTOLIN   HFA) 108 (90 Base) MCG/ACT inhaler TAKE 2 PUFFS BY MOUTH EVERY 6 HOURS AS NEEDED FOR WHEEZE OR SHORTNESS OF BREATH Patient taking differently: Inhale 2 puffs into the lungs every 6 (six) hours as needed for wheezing or shortness of breath. 12/20/21  Yes Heilingoetter, Cassandra L, PA-C  aspirin  EC 81 MG tablet Take 81 mg by mouth at bedtime.   Yes [provider]  atorvastatin  (LIPITOR ) 80 MG tablet Take 40 mg by mouth every evening. 04/15/24  Yes [provider]  BREZTRI  AEROSPHERE 160-9-4.8 MCG/ACT AERO inhaler INHALE 2 PUFFS INTO THE LUNGS IN THE MORNING AND ALSO 2 PUFFS AT BEDTIME 12/08/23  Yes Byrum, Lamar RAMAN, MD  Evolocumab  (REPATHA  SURECLICK) 140 MG/ML SOAJ Inject 140 mg into the skin every 14 (fourteen) days. 04/14/24  Yes Patwardhan, Manish J, MD  feeding supplement (ENSURE PLUS HIGH PROTEIN) LIQD Take 237 mLs by mouth 2 (two) times daily between meals. Patient taking differently: Take 237 mLs by mouth daily. 06/10/24  Yes Dahal, Chapman, MD  ferrous sulfate  325 (65 FE) MG EC tablet TAKE 1 TABLET BY MOUTH EVERY DAY WITH BREAKFAST Patient taking differently: Take 325 mg by mouth daily with breakfast. 06/27/24  Yes Heilingoetter, Cassandra L, PA-C  HYDROcodone -acetaminophen  (NORCO/VICODIN) 5-325 MG tablet Take 1-2 tablets by mouth every 6 (six) hours as needed for moderate pain (pain score 4-6). 06/10/24  Yes Dahal, Chapman, MD  levothyroxine  (SYNTHROID ) 175 MCG tablet TAKE 1 TABLET BY MOUTH EVERY MORNING ON MONDAY - FRIDAY Patient taking differently: Take 175 mcg by mouth. Take 175 mcg by mouth 30 minutes before breakfast on Mon/Tues/Wed/Thurs/Fri- not on Saturdays or Sundays 04/14/24  Yes [name], FNP  metoprolol  succinate (TOPROL -XL) 50 MG 24 hr tablet Take 50 mg by mouth every evening. 04/15/24  Yes [provider]  nitrofurantoin  (MACRODANTIN ) 100 MG capsule Take 100 mg by mouth daily.   Yes [provider]  nitrofurantoin , cherylle,  (  MACROBID ) 100 MG capsule Take 100 mg by mouth daily.   Yes [provider]  nitroGLYCERIN  (NITROSTAT ) 0.4 MG SL tablet PLACE 1 TABLET UNDER THE TONGUE EVERY 5 (FIVE) MINUTES X 3 DOSES AS NEEDED FOR CHEST PAIN. 07/28/23  Yes [name], FNP  OXYGEN  Inhale 3 L/min into the lungs continuous.   Yes [provider]  polyethylene glycol powder (GLYCOLAX /MIRALAX ) 17 GM/SCOOP powder Dissolve 1 capful (17g) in 4-8 ounces of liquid and take by mouth daily as needed for mild constipation. 06/10/24  Yes Dahal, Chapman, MD  silver  sulfADIAZINE  (SILVADENE ) 1 % cream Apply to affected area twice a week 07/11/24 07/11/25 Yes Moore, [name], FNP  dexamethasone  (DECADRON ) 2 MG tablet Take 4 tablets daily with breakfast through 10/29. Reduce to 2 tablets daily with breakfast from 10/30 through 11/12. Reduce to 1 tablet daily with breakfast from 11/13 through 11/27. Last dose: 11/27. Patient not taking: Reported on 08/03/2024 06/14/24   Izell Domino, MD  estradiol  (ESTRACE ) 0.1 MG/GM vaginal cream Place 0.5g twice a week at opening of vagina Patient not taking: Reported on 08/03/2024 10/13/23   Marilynne Rosaline SAILOR, MD  fluconazole  (DIFLUCAN ) 100 MG tablet Take 2 tablets today, then 1 tablet daily x 13 more days. HOLD ATORVASTATIN  WHILE ON THIS. Patient not taking: Reported on 08/03/2024 06/20/24   Izell Domino, MD  trospium  (SANCTURA ) 20 MG tablet Take 1 tablet (20 mg total) by mouth 2 (two) times daily. Patient not taking: Reported on 08/03/2024 10/13/23 10/07/24  Marilynne Rosaline SAILOR, MD   Allergies[1] Review of Systems Complains of weakness Physical Exam Elderly appearing lady Chronically ill-appearing Left lower extremity wound with dressing intact Awake alert oriented answers questions appropriately no focal motor or sensory deficits Has oxygen  via nasal cannula and regular work of breathing S1-S2  Vital Signs: BP (!) 124/45 (BP Location: Left Arm)   Pulse 80   Temp 97.6 F (36.4 C)  (Oral)   Resp 20   Ht 5' 6 (1.676 m)   Wt 71.5 kg   SpO2 94%   BMI 25.44 kg/m  Pain Scale: 0-10   Pain Score: 4    SpO2: SpO2: 94 % O2 Device:SpO2: 94 % O2 Flow Rate: .O2 Flow Rate (L/min): 2 L/min  IO: Intake/output summary:  Intake/Output Summary (Last 24 hours) at 08/04/2024 1429 Last data filed at 08/04/2024 1000 Gross per 24 hour  Intake 1796.15 ml  Output 400 ml  Net 1396.15 ml    LBM: Last BM Date : 08/03/24 Baseline Weight: Weight: 70.8 kg Most recent weight: Weight: 71.5 kg     Palliative Assessment/Data:   Palliative performance scale 50%  Time In: 1300 Time Out: 1400 Time Total: 60 Greater than 50%  of this time was spent counseling and coordinating care related to the above assessment and plan.  Signed by: Lonia Serve, MD   Please contact Palliative Medicine Team phone at [phone] for questions and concerns.  For individual provider: See Amion                 [1]  Allergies Allergen Reactions   Codeine Nausea And Vomiting   Norvasc  [Amlodipine ] Rash

## 2024-08-04 NOTE — Progress Notes (Addendum)
 Abigail [name]   DOB:05/25/1936   FM#:[id number]      ASSESSMENT & PLAN:  Abigail [name]. Abigail [name] is an 88 year old female patient admitted on  Oncologic history significant for Non-small lung caner with brain mets.  Medical Oncology following.   Generalized weakness Fatigue - Patient complains of progressive tiredness and weakness. - Continue supportive care.  Anemia - Hemoglobin 7.3 today.  Status post PRBC transfusion given for hemoglobin 6.8. - Likely multifactorial due to recent oncologic therapy, malignancy, nutritional status - Baseline hemoglobin mid 7-9 range within the last month. - Recommend PRBC transfusion for hemoglobin <7.0 - Continue to monitor CBC with differential  Metastatic non-small cell lung cancer, with brain mets, left hilar, and mediastinal lymphadenopathy, stage IV - Initially diagnosed diagnosed 07/15/2021 with adenocarcinoma of LLL lung lymph node, stage IIIA.  Presented with symptoms of a solitary left cerebellar brain metastasis. - Status post concurrent chemoradiation.  Status post SBRT to brain mets.  Completed therapy February 2024 - MRI brain 06/06/2024 showed 2.3 x 2.0 x 1.9 mass left cerebellum concerning for metastatic disease.  CT chest abdomen pelvis also done at that time showed no metastatic spread. -- Received stereotactic radiosurgery [facility]) for brain metastasis in October 2025. Given Decadron  at that time.  - Recommend Palliative eval for goals of care. - Patient is scheduled to have repeat imaging 09/2024. - Medical oncology/Dr. Sherrod following.  Chronic wound History of skin cancer - Left lower extremity. - Status post resection of skin cancer October 2025.   - Continue dressing changes as ordered  Chronic hypoxic respiratory failure History of COPD - On O2 at home.  Since currently seen on O2 via nasal cannula.  No shortness of breath or dyspnea noted at this time. - Continue to monitor respiratory status  History of falls - Fell at home  morning of admission 08/03/2024. - Falls precautions    Code Status Full   Subjective:  Patient seen awake and alert laying in bed. She is ill-appearing and pale.  O2 via nasal cannula intact, does not appear to be in respiratory distress.  States she continues to feel very weak and that it is difficult lifting her foot.  Wound on LLE with dressing intact.  Admits to lower abdominal pain.  Denies chest pain, cough, or chills.  Denies bleeding.  Objective:   Intake/Output Summary (Last 24 hours) at 08/04/2024 0951 Last data filed at 08/04/2024 0648 Gross per 24 hour  Intake 1676.15 ml  Output 400 ml  Net 1276.15 ml     PHYSICAL EXAMINATION: ECOG PERFORMANCE STATUS: 3 - Symptomatic, >50% confined to bed  Vitals:   08/03/24 2323 08/04/24 0227  BP: (!) 116/46 (!) 117/46  Pulse: 80 86  Resp: 16 18  Temp: 98.4 F (36.9 C) 98.2 F (36.8 C)  SpO2: 96% 94%   Filed Weights   08/03/24 0908 08/03/24 1359  Weight: 156 lb (70.8 kg) 157 lb 10.1 oz (71.5 kg)    GENERAL: alert, +elderly +chronically ill-appearing  SKIN: +Pale skin color, + poor turgor + LLE wound with dressing intact EYES: normal, conjunctiva are pink and non-injected, sclera clear OROPHARYNX: no exudate, no erythema and lips, buccal mucosa, and tongue normal  NECK: supple, thyroid  normal size, non-tender, without nodularity LYMPH: no palpable lymphadenopathy in the cervical, axillary or inguinal LUNGS: + Diminished to auscultation + O2 via nasal cannula intact  HEART: regular rate & rhythm and no murmurs and no lower extremity edema ABDOMEN: abdomen soft, non-tender and normal  bowel sounds MUSCULOSKELETAL: + Difficulty ambulating PSYCH: alert & oriented x 3 with fluent speech NEURO: no focal motor/sensory deficits   All questions were answered. The patient knows to call the clinic with any problems, questions or concerns.   The total time spent in the appointment was 40 minutes encounter with patient including  review of chart and various tests results, discussions about plan of care and coordination of care plan  [name], NP 08/04/2024 9:51 AM    Labs Reviewed:  Lab Results  Component Value Date   WBC 7.4 08/04/2024   HGB 7.3 (L) 08/04/2024   HCT 23.5 (L) 08/04/2024   MCV 87.7 08/04/2024   PLT 240 08/04/2024   Recent Labs    06/06/24 1432 06/07/24 0340 06/08/24 0203 06/20/24 1059 08/03/24 0920 08/04/24 0420  NA 134* 134*   < > 136 136 136  K 5.1 4.4   < > 5.2* 4.3 4.0  CL 103 101   < > 106 99 104  CO2 23 23   < > 23 24 21*  GLUCOSE 150* 172*   < > 194* 148* 125*  BUN 38* 34*   < > 66* 53* 50*  CREATININE 1.54* 1.47*   < > 1.62* 2.20* 1.96*  CALCIUM  9.0 8.7*   < > 8.7* 8.7* 7.8*  GFRNONAA 33* 35*   < > 31* 21* 24*  PROT 7.8 6.5  --  6.8 6.5  --   ALBUMIN 3.8 3.1*  --  3.6 3.0*  --   AST 19 16  --  14* 22  --   ALT 14 12  --  14 15  --   ALKPHOS 116 100  --  97 101  --   BILITOT 0.3 0.6  --  0.3 0.3  --   BILIDIR <0.1  --   --   --   --   --   IBILI NOT CALCULATED  --   --   --   --   --    < > = values in this interval not displayed.    Studies Reviewed:  MRI brain 06/06/2024: IMPRESSION: 1. 2.3 x 2.0 x 1.9 cm enhancing mass in the lateral aspect of the left cerebellum concerning for metastatic disease. Surrounding vasogenic edema and mass effect on the fourth ventricle. 2. The mass abuts the distal transverse and proximal sigmoid sinus on the left, which remain patent. 3. Moderate chronic microvascular ischemic changes.  CT chest 06/07/2024: IMPRESSION: Stable left perihilar opacity with bandlike soft tissue extending into the left upper lobe and superior segment of the left lower lobe. This is stable since prior study and likely reflects post treatment changes.   Small left pleural effusion, slightly increased in size since prior study.   Diffuse coronary artery disease.

## 2024-08-04 NOTE — Patient Outreach (Signed)
 Reviewed chart in preparation to contact patient. Noted patient was admitted to [facility] on 08/03/24 for dx: Weakness; Dehydration; Symptomatic Anemia. Sent an in basket message to Andrea Dimes RN, [facility] nurse to make her aware. Cancelled patient's complex nurse care manager telephone appointment scheduled for today with this RN.   Clayborne Ly RN BSN CCM   [facility], [facility] Nurse Care Coordinator  Direct Dial: [phone] Website: [email or web address]

## 2024-08-04 NOTE — Evaluation (Signed)
 Physical Therapy Evaluation Patient Details Name: Abigail [name] MRN: [id number] DOB: 1936-04-25 Today's Date: 08/04/2024  History of Present Illness  Pt is an 88y.o female admitted on 08/03/24 with generalized weakness. She fell at home while trying to put her pants on and was unable to get up. PMH includes: metastatic non-small lung cancer to brain, currently on radiation treatment, chronic hypoxic respiratory failure on 3 L O2, CKD stage IIIb, 2 skin cancers on her legs s/p resection, hypertension, coronary artery disease, chronic normocytic anemia, hypothyroidism.  Clinical Impression  Pt reports being IND prior to admission with use of rollator to move around home and 3L oxygen , however she takes it off when she goes to the bathroom. At evaluation, limited functional mobility secondary to fatigue however able to complete bed mobility and transfers with MIN-MOD A. Pt voiced concerns about having to take care of herself more since he son works and doesn't help out as much when he is home (he heats up food for himself, but I have to make my own). See problem list below for deficits. Pt would benefit from continued therapy following discharge with HHPT to improve endurance and strength. Education on O2 wear, O2 sat monitoring and breathing exercises completed. PT will continue to follow pt while she is in acute hospital.       If plan is discharge home, recommend the following: A little help with walking and/or transfers;A little help with bathing/dressing/bathroom;Assist for transportation;Assistance with cooking/housework   Can travel by private vehicle        Equipment Recommendations None recommended by PT  Recommendations for Other Services  OT consult (alone during the day, concerns for taking care of herself; she states her son doesn't help out much)    Functional Status Assessment Patient has had a recent decline in their functional status and demonstrates the ability to make  significant improvements in function in a reasonable and predictable amount of time.     Precautions / Restrictions Precautions Precautions: None Recall of Precautions/Restrictions: Intact Precaution/Restrictions Comments: needs oxygen  at baseline however takes off intermittently, doesn't monitor O2 levels, poor awareness of when O2 is low when she removes [place] Restrictions Weight Bearing Restrictions Per Provider Order: No      Mobility  Bed Mobility Overal bed mobility: Needs Assistance Bed Mobility: Supine to Sit, Sit to Supine     Supine to sit: Min assist, Used rails, HOB elevated Sit to supine: Min assist, HOB elevated, Used rails   General bed mobility comments: poor postural awareness with heavy posterior lean requiring assist for balance, difficulty sequencing scooting to rest feet on floor    Transfers Overall transfer level: Needs assistance Equipment used: 1 person hand held assist Transfers: Sit to/from Stand Sit to Stand: Mod assist           General transfer comment: limited by fatigue, pt also fearful of mobility due to weakness and impaired balance    Ambulation/Gait Ambulation/Gait assistance:  (deferred today d/t fatigue)                Stairs            Wheelchair Mobility     Tilt Bed    Modified Rankin (Stroke Patients Only)       Balance Overall balance assessment: Needs assistance Sitting-balance support: Feet supported, Bilateral upper extremity supported Sitting balance-Leahy Scale: Fair     Standing balance support: Bilateral upper extremity supported, During functional activity Standing balance-Leahy Scale: Fair  Pertinent Vitals/Pain Pain Assessment Pain Assessment: Faces Faces Pain Scale: Hurts little more Pain Location: back, shoulders Pain Descriptors / Indicators: Dull, Grimacing, Guarding Pain Intervention(s): Limited activity within patient's tolerance, Monitored  during session, Repositioned    Home Living Family/patient expects to be discharged to:: Private residence Living Arrangements: Children (son, [name]) Available Help at Discharge: Family;Available PRN/intermittently (pt home alone during day as son works) Type of Home: House Home Access: Stairs to enter Entrance Stairs-Rails: None Secretary/administrator of Steps: 1   Home Layout: One level Home Equipment: Shower seat;Grab bars - tub/shower;Hand held shower head;Toilet riser;Rollator (4 wheels);Cane - quad Additional Comments: on 3L O2 at baseline however removes to walk to bathroom. Pt reports that son isn't able to reliably help her and they take care of their own things at home regarding IADLs    Prior Function Prior Level of Function : Independent/Modified Independent             Mobility Comments: walks with rollator, on 3L O2 24/7 but then reports taking it off to go to the bathroom as she has tripped over O2 line when in bathroom before, denies falls in past 6 months ADLs Comments: doesn't drive; independent with showering using shower seat     Extremity/Trunk Assessment   Upper Extremity Assessment Upper Extremity Assessment: Defer to OT evaluation    Lower Extremity Assessment Lower Extremity Assessment: Generalized weakness;LLE deficits/detail LLE Sensation: decreased light touch (near wound on shin)    Cervical / Trunk Assessment Cervical / Trunk Assessment: Kyphotic  Communication   Communication Communication: No apparent difficulties    Cognition Arousal: Alert Behavior During Therapy: Flat affect, WFL for tasks assessed/performed   PT - Cognitive impairments: No apparent impairments                         Following commands: Intact       Cueing Cueing Techniques: Verbal cues     General Comments      Exercises     Assessment/Plan    PT Assessment Patient needs continued PT services  PT Problem List Decreased strength;Decreased  range of motion;Decreased activity tolerance;Decreased balance;Decreased mobility;Cardiopulmonary status limiting activity       PT Treatment Interventions Gait training;Balance training;Functional mobility training;Therapeutic activities;Therapeutic exercise;Patient/family education    PT Goals (Current goals can be found in the Care Plan section)  Acute Rehab PT Goals Patient Stated Goal: go home PT Goal Formulation: With patient Time For Goal Achievement: 08/18/24 Potential to Achieve Goals: Fair    Frequency Min 2X/week     Co-evaluation               AM-PAC PT 6 Clicks Mobility  Outcome Measure Help needed turning from your back to your side while in a flat bed without using bedrails?: A Little Help needed moving from lying on your back to sitting on the side of a flat bed without using bedrails?: A Little Help needed moving to and from a bed to a chair (including a wheelchair)?: A Little Help needed standing up from a chair using your arms (e.g., wheelchair or bedside chair)?: A Little Help needed to walk in hospital room?: A Lot Help needed climbing 3-5 steps with a railing? : A Lot 6 Click Score: 16    End of Session Equipment Utilized During Treatment: Gait belt Activity Tolerance: Patient tolerated treatment well;Patient limited by fatigue Patient left: in bed;with call bell/phone within reach;with bed alarm set Nurse Communication:  Mobility status PT Visit Diagnosis: Unsteadiness on feet (R26.81);Muscle weakness (generalized) (M62.81);Difficulty in walking, not elsewhere classified (R26.2)    Time: 8689-8651 PT Time Calculation (min) (ACUTE ONLY): 38 min   Charges:   PT Evaluation $PT Eval Moderate Complexity: 1 Mod PT Treatments $Therapeutic Activity: 23-37 mins           Rose-Marie Hickling H. Aaleigha Bozza, PT, DPT   [employer] 08/04/2024, 3:12 PM

## 2024-08-04 NOTE — Progress Notes (Signed)
 PROGRESS NOTE  [name]  FMW:[id number] DOB: May 14, 1936 DOA: 08/03/2024 PCP: [name], FNP   Brief Narrative:  Abigail [name] is a 88 y.o. female with medical history significant of metastatic non-small lung cancer to brain, currently on radiation treatment, chronic hypoxic respiratory failure on 3 L of oxygen  per minute, CKD stage IIIb, 2 skin cancers on her legs s/p resection, hypertension, coronary artery disease, COPD on 3 L of oxygen  per minute, chronic normocytic anemia, hypothyroidism who presented from home with complaint of generalized weakness.  Labs showed creatinine of 2.2, hemoglobin of 6.7.  Started on IV fluids and given a unit of PRBC transfusion.  PT/OT consulted.  Oncology also consulted  Assessment & Plan:  Principal Problem:   AKI (acute kidney injury) Active Problems:   Urinary tract infection   Hyperlipidemia   Hypothyroidism   CKD (chronic kidney disease), stage III (HCC)   Essential hypertension   Coronary artery disease of native artery of native heart with stable angina pectoris   Malignant neoplasm of unspecified part of unspecified bronchus or lung (HCC)   COPD (chronic obstructive pulmonary disease) (HCC)   Chronic iron  deficiency anemia   AKI on CKD stage IIIb: Baseline creatinine around 1.6.  Presented with creatinine of 2.  Was having poor oral intake at home.  Continue IV fluid.  Renal function improving.  Avoid nephrotoxins   Metastatic non-small cell lung cancer with solitary cerebral brain metastasis: Follows with Dr. Sherrod.  Completed concurrent chemoradiation and 1 year of Durvalumab  consolidation therapy.  Currently on stereotactic radiosurgery for brain mets.  She completed the course of Decadron .  Follows with radiation oncology.   oncology/Dr. Sherrod consulted   Chronic hypoxic respiratory failure/COPD: Currently not on exacerbation.  On 3 L of oxygen  chronically at home.  Continue bronchodilators, inhalers.  Not in acute exacerbation at  the moment.   Acute on chronic normocytic anemia: Baseline hemoglobin around 7-8.  Likely associated with malignancy.  No evidence of acute blood loss.  Denies any hematochezia .  But she is not aware about her stool color.  Hemoglobin was in the range of 6 on presentation.  Given a unit of PRBC.  Monitor H&H.  Will check FOBT.  Continue Protonix .  Given IV iron , started on oral iron    Hypertension: Takes metoprolol  at home.  Currently on hold.  Blood pressure low normal   History of coronary artery disease/elevated troponin: On aspirin , Lipitor , Repatha .  No anginal symptoms.  Takes aspirin  at home, currently on hold, will resume on discharge if no evidence of GI bleed. Denies any chest pain today.  Elevated troponin is likely from demand ischemia from anemia.   History of skin cancer: Underwent resection of the skin tumor on the left leg on October of this year.  Wound care consulted.  Wound appears to be slightly infected with purulent material.  Continue ceftriaxone    History of recurrent UTI: On chronic therapy with nitrofurantoin . She has  noted frequency, dysuria symptoms.  Also complained of left lower quadrant pain.  Checking CT abdomen/pelvis without contrast.  Started on ceftriaxone .  Urine culture showing 30,000 colonies of Pseudomonas along with mixed organisms.   Debility/deconditioning/generalized weakness: Patient lives with son.But on son is at at work all time time.  Will consult PT.Ambulates with rollator.Son is agreeable for SNF if needed   Goals of care: Elderly patient with multiple comorbidities including metastatic lung cancer.  We consulted palliative care for goals of care.  I have a long  discussion with her son [name] on phone today.  He understands her poor quality of life.  He is interested to discuss goals of care.            DVT prophylaxis:SCDs Start: 08/03/24 1141     Code Status: Full Code  Family Communication: Called and discussed with son [name] on phone  on 12/11  Patient status: Inpatient  Patient is from : Home  Anticipated discharge to: Home versus SNF  Estimated DC date: 1 to 2 days   Consultants: Oncology  Procedures: None  Antimicrobials:  Anti-infectives (From admission, onward)    Start     Dose/Rate Route Frequency Ordered Stop   08/04/24 1300  cefTRIAXone  (ROCEPHIN ) 2 g in sodium chloride  0.9 % 100 mL IVPB  Status:  Discontinued        2 g 200 mL/hr over 30 Minutes Intravenous Every 24 hours 08/03/24 1220 08/03/24 1236   08/03/24 1300  cefTRIAXone  (ROCEPHIN ) 2 g in sodium chloride  0.9 % 100 mL IVPB        2 g 200 mL/hr over 30 Minutes Intravenous Every 24 hours 08/03/24 1236     08/03/24 1200  cefTRIAXone  (ROCEPHIN ) 1 g in sodium chloride  0.9 % 100 mL IVPB  Status:  Discontinued        1 g 200 mL/hr over 30 Minutes Intravenous Every 24 hours 08/03/24 1154 08/03/24 1220   08/03/24 1145  nitrofurantoin  (MACRODANTIN ) capsule 100 mg  Status:  Discontinued        100 mg Oral Daily 08/03/24 1143 08/03/24 1154       Subjective: Patient seen and examined at bedside today.  Hemodynamically stable.  Afebrile.  Lying on bed.  Complains of being weak and sleepy.  Alert and oriented.  She is complaining of intermittent lower abdomen, left lower quadrant pain.  Abdomen is soft and nondistended and mostly nontender  Objective: Vitals:   08/03/24 2051 08/03/24 2053 08/03/24 2323 08/04/24 0227  BP: 110/88 110/88 (!) 116/46 (!) 117/46  Pulse: 81 81 80 86  Resp: 14 14 16 18   Temp: 98.1 F (36.7 C) 98.1 F (36.7 C) 98.4 F (36.9 C) 98.2 F (36.8 C)  TempSrc:  Oral Oral Oral  SpO2:  100% 96% 94%  Weight:      Height:        Intake/Output Summary (Last 24 hours) at 08/04/2024 1227 Last data filed at 08/04/2024 1000 Gross per 24 hour  Intake 1796.15 ml  Output 400 ml  Net 1396.15 ml   Filed Weights   08/03/24 0908 08/03/24 1359  Weight: 70.8 kg 71.5 kg    Examination:  General exam: Overall comfortable, not in  distress, deconditioned/weak HEENT: PERRL Respiratory system:  no wheezes or crackles  Cardiovascular system: S1 & S2 heard, RRR.  Gastrointestinal system: Abdomen is nondistended, soft and nontender. Central nervous system: Alert and oriented Extremities: No edema, no clubbing ,no cyanosis Skin: Ulcer on the shin   Data Reviewed: I have personally reviewed following labs and imaging studies  CBC: Recent Labs  Lab 08/03/24 0920 08/04/24 0420  WBC 8.8 7.4  NEUTROABS 7.8*  --   HGB 6.8* 7.3*  HCT 22.7* 23.5*  MCV 90.1 87.7  PLT 276 240   Basic Metabolic Panel: Recent Labs  Lab 08/03/24 0920 08/04/24 0420  NA 136 136  K 4.3 4.0  CL 99 104  CO2 24 21*  GLUCOSE 148* 125*  BUN 53* 50*  CREATININE 2.20* 1.96*  CALCIUM  8.7* 7.8*  Recent Results (from the past 240 hours)  Resp panel by RT-PCR (RSV, Flu A&B, Covid) Anterior Nasal Swab     Status: None   Collection Time: 08/03/24  9:21 AM   Specimen: Anterior Nasal Swab  Result Value Ref Range Status   SARS Coronavirus 2 by RT PCR NEGATIVE NEGATIVE Final    Comment: (NOTE) SARS-CoV-2 target nucleic acids are NOT DETECTED.  The SARS-CoV-2 RNA is generally detectable in upper respiratory specimens during the acute phase of infection. The lowest concentration of SARS-CoV-2 viral copies this assay can detect is 138 copies/mL. A negative result does not preclude SARS-Cov-2 infection and should not be used as the sole basis for treatment or other patient management decisions. A negative result may occur with  improper specimen collection/handling, submission of specimen other than nasopharyngeal swab, presence of viral mutation(s) within the areas targeted by this assay, and inadequate number of viral copies(<138 copies/mL). A negative result must be combined with clinical observations, patient history, and epidemiological information. The expected result is Negative.  Fact Sheet for Patients:   bloggercourse.com  Fact Sheet for Healthcare Providers:  seriousbroker.it  This test is no t yet approved or cleared by the [place]  FDA and  has been authorized for detection and/or diagnosis of SARS-CoV-2 by FDA under an Emergency Use Authorization (EUA). This EUA will remain  in effect (meaning this test can be used) for the duration of the COVID-19 declaration under Section 564(b)(1) of the Act, 21 U.S.C.section 360bbb-3(b)(1), unless the authorization is terminated  or revoked sooner.       Influenza A by PCR NEGATIVE NEGATIVE Final   Influenza B by PCR NEGATIVE NEGATIVE Final    Comment: (NOTE) The Xpert Xpress SARS-CoV-2/FLU/RSV plus assay is intended as an aid in the diagnosis of influenza from Nasopharyngeal swab specimens and should not be used as a sole basis for treatment. Nasal washings and aspirates are unacceptable for Xpert Xpress SARS-CoV-2/FLU/RSV testing.  Fact Sheet for Patients: bloggercourse.com  Fact Sheet for Healthcare Providers: seriousbroker.it  This test is not yet approved or cleared by the [place]  FDA and has been authorized for detection and/or diagnosis of SARS-CoV-2 by FDA under an Emergency Use Authorization (EUA). This EUA will remain in effect (meaning this test can be used) for the duration of the COVID-19 declaration under Section 564(b)(1) of the Act, 21 U.S.C. section 360bbb-3(b)(1), unless the authorization is terminated or revoked.     Resp Syncytial Virus by PCR NEGATIVE NEGATIVE Final    Comment: (NOTE) Fact Sheet for Patients: bloggercourse.com  Fact Sheet for Healthcare Providers: seriousbroker.it  This test is not yet approved or cleared by the [place]  FDA and has been authorized for detection and/or diagnosis of SARS-CoV-2 by FDA under an Emergency Use  Authorization (EUA). This EUA will remain in effect (meaning this test can be used) for the duration of the COVID-19 declaration under Section 564(b)(1) of the Act, 21 U.S.C. section 360bbb-3(b)(1), unless the authorization is terminated or revoked.  Performed at [facility], [address], [place], [place] [address]   Urine Culture (for pregnant, neutropenic or urologic patients or patients with an indwelling urinary catheter)     Status: Abnormal (Preliminary result)   Collection Time: 08/03/24 12:28 PM   Specimen: Urine, Clean Catch  Result Value Ref Range Status   Specimen Description   Final    URINE, CLEAN CATCH Performed at [facility], [address] [place], [place] [address]  Special Requests   Final    NONE Performed at [facility], [address], [place], [place] [address]    Culture (A)  Final    30,000 COLONIES/mL PSEUDOMONAS AERUGINOSA WITHIN MIXED ORGANISMS Performed at [facility], [address] [place], [place] [address]    Report Status PENDING  Incomplete     Radiology Studies: No results found.  Scheduled Meds:  atorvastatin   80 mg Oral Daily   budesonide -glycopyrrolate -formoterol   2 puff Inhalation Daily   feeding supplement  237 mL Oral BID BM   ferrous sulfate   325 mg Oral Q breakfast   levothyroxine   175 mcg Oral Once per day on Monday Tuesday Wednesday Thursday   pantoprazole   40 mg Oral Daily   Continuous Infusions:  cefTRIAXone  (ROCEPHIN )  IV Stopped (08/03/24 1523)   lactated ringers        LOS: 1 day   Ivonne Mustache, MD [facility] P12/06/2024, 12:27 PM

## 2024-08-05 ENCOUNTER — Other Ambulatory Visit (HOSPITAL_COMMUNITY): Payer: Self-pay

## 2024-08-05 ENCOUNTER — Encounter (HOSPITAL_COMMUNITY): Payer: Self-pay | Admitting: Internal Medicine

## 2024-08-05 LAB — URINE CULTURE: Culture: 30000 — AB

## 2024-08-05 LAB — GLUCOSE, CAPILLARY
Glucose-Capillary: 113 mg/dL — ABNORMAL HIGH (ref 70–99)
Glucose-Capillary: 123 mg/dL — ABNORMAL HIGH (ref 70–99)
Glucose-Capillary: 110 mg/dL — ABNORMAL HIGH (ref 70–99)
Glucose-Capillary: 157 mg/dL — ABNORMAL HIGH (ref 70–99)
Glucose-Capillary: 165 mg/dL — ABNORMAL HIGH (ref 70–99)

## 2024-08-05 LAB — BASIC METABOLIC PANEL WITH GFR
Anion gap: 10 (ref 5–15)
BUN: 47 mg/dL — ABNORMAL HIGH (ref 8–23)
CO2: 21 mmol/L — ABNORMAL LOW (ref 22–32)
Calcium: 7.8 mg/dL — ABNORMAL LOW (ref 8.9–10.3)
Chloride: 102 mmol/L (ref 98–111)
Creatinine, Ser: 1.97 mg/dL — ABNORMAL HIGH (ref 0.44–1.00)
GFR, Estimated: 24 mL/min — ABNORMAL LOW (ref >60)
Glucose, Bld: 130 mg/dL — ABNORMAL HIGH (ref 70–99)
Potassium: 4 mmol/L (ref 3.5–5.1)
Sodium: 134 mmol/L — ABNORMAL LOW (ref 135–145)

## 2024-08-05 LAB — CBC
HCT: 22.9 % — ABNORMAL LOW (ref 36.0–46.0)
Hemoglobin: 7.1 g/dL — ABNORMAL LOW (ref 12.0–15.0)
MCH: 27.6 pg (ref 26.0–34.0)
MCHC: 31 g/dL (ref 30.0–36.0)
MCV: 89.1 fL (ref 80.0–100.0)
Platelets: 243 K/uL (ref 150–400)
RBC: 2.57 MIL/uL — ABNORMAL LOW (ref 3.87–5.11)
RDW: 16.9 % — ABNORMAL HIGH (ref 11.5–15.5)
WBC: 8.1 K/uL (ref 4.0–10.5)
nRBC: 0 % (ref 0.0–0.2)

## 2024-08-05 LAB — MAGNESIUM: Magnesium: 2 mg/dL (ref 1.7–2.4)

## 2024-08-05 LAB — PHOSPHORUS: Phosphorus: 3.3 mg/dL (ref 2.5–4.6)

## 2024-08-05 LAB — LACTIC ACID, PLASMA: Lactic Acid, Venous: 0.7 mmol/L (ref 0.5–1.9)

## 2024-08-05 MED ORDER — ENSURE MAX PROTEIN PO LIQD
11.0000 [oz_av] | Freq: Two times a day (BID) | ORAL | Status: DC
  Administered 2024-08-05 – 2024-09-03 (×28): 11 [oz_av] via ORAL
  Filled 2024-08-05 (×35): qty 330

## 2024-08-05 NOTE — Progress Notes (Addendum)
° °      Overnight   NAME: Abigail [name] MRN: [id number] DOB : 06-Apr-1936    Date of Service   08/05/2024   HPI/Events of Note    88 year old female with medical history significant for metastatic non-small cell lung cancer with mets to the brain with, patient currently on radiation treatment, chronic hypoxic respiratory failure on 3 L of oxygen  per minute, CKD stage IIIb, 2 skin cancers on her legs subject post resection, hypertension, coronary artery disease, COPD, chronic normocytic anemia, hypothyroidism who presented from home with complaint of generalized weakness.  Labs were concerning for creatinine of 2.2, hemoglobin of 6.7.  Started on IV fluids and given 1 unit of packed red blood cells.  CT abdomen ordered due to intermittent left lower quadrant pain.   IMPRESSION: 1. Diffuse diverticular disease of the colon. Focal wall thickening and mild inflammatory change at the sigmoid colon, probably representing diverticulitis with colon mass not excluded. New gas and fluid collection along the left pelvic sidewall measuring up to 5.9 cm, suspicious for focal/contained perforation or abscess, assessment limited in the absence of intravenous contrast. Smaller gas and fluid collection in the left inferior pelvis, this is indeterminate for an additional gas and fluid collection versus fluid containing inflamed large diverticulum. 2. Interim development of moderate to severe left hydronephrosis and hydroureter, ureter dilated to the level of the pelvis/area of inflammatory change at sigmoid colon. Suspect obstruction of the distal left ureter by the colon inflammatory changes and gas and fluid collection. 3. Partially imaged slightly loculated appearing small to moderate left pleural effusion. Mild subpleural atelectasis or minimal aspiration in the posterior right lung base. 4. Moderate to large hiatal hernia. 5. Aortic atherosclerosis.   Put patient on n.p.o. diet, every 4 blood  sugar checks, ordered labs to be drawn at midnight added electrolytes and lactic acid.  Broadened antibiotic stewardship by adding metronidazole.  Foley catheter consulted general surgery via secure chat, day team to follow-up on this.   On assessment patient was alert and oriented x 2.  Abdomen not distended, nontender.  Bowel sounds hyperactive in all 4 quadrants.  Patient denies nausea vomiting diarrhea.  Denies any pain at this time. last bowel movement reported to be 12/10.  Vital signs stable patient is afebrile  Educated patient on results of her CT scan.  Patient unable to relay information back due to orientation.  Interventions/ Plan   Labs  Foley catheter  NPO w/q4 CBG Consulted general surgery       Laneta Gardener- Aram BSN RN CCRN AGACNP-BC Acute Care Nurse Practitioner [facility]

## 2024-08-05 NOTE — Plan of Care (Signed)

## 2024-08-05 NOTE — TOC Initial Note (Signed)
 Transition of Care [facility]) - Initial/Assessment Note    Patient Details  Name: Abigail [name] MRN: [id number] Date of Birth: [birth year]  [facility] CM/SW Contact:    Tawni CHRISTELLA Eva, LCSW Phone Number: 08/05/2024, 3:34 PM  Clinical Narrative:                 CSW met with the pt at bedside to discuss the recommendation for home health services. The pt is agreeable and would like [facility]. A referral was sent, and [facility] has accepted for home health services; orders will need to be placed. The pt reports using oxygen  at baseline. She reports having a portable backpack but no tubing for it. She is requesting EMS transport home. ICM to follow for discharge needs.  Expected Discharge Plan: Home w Home Health Services Barriers to Discharge: Continued Medical Work up   Patient Goals and CMS Choice Patient states their goals for this hospitalization and ongoing recovery are:: retrun home with home health CMS Medicare.gov Compare Post Acute Care list provided to:: Patient Choice offered to / list presented to : Patient      Expected Discharge Plan and Services       Living arrangements for the past 2 months: Single Family Home                                      Prior Living Arrangements/Services Living arrangements for the past 2 months: Single Family Home Lives with:: Self Patient language and need for interpreter reviewed:: Yes Do you feel safe going back to the place where you live?: Yes      Need for Family Participation in Patient Care: No (Comment) Care giver support system in place?: No (comment)   Criminal Activity/Legal Involvement Pertinent to Current Situation/Hospitalization: No - Comment as needed  Activities of Daily Living   ADL Screening (condition at time of admission) Independently performs ADLs?: No Does the patient have a NEW difficulty with bathing/dressing/toileting/self-feeding that is expected to last >3 days?: No Does the  patient have a NEW difficulty with getting in/out of bed, walking, or climbing stairs that is expected to last >3 days?: No Does the patient have a NEW difficulty with communication that is expected to last >3 days?: No Is the patient deaf or have difficulty hearing?: No Does the patient have difficulty seeing, even when wearing glasses/contacts?: No Does the patient have difficulty concentrating, remembering, or making decisions?: No  Permission Sought/Granted                  Emotional Assessment Appearance:: Appears stated age Attitude/Demeanor/Rapport: Gracious Affect (typically observed): Accepting Orientation: : Oriented to Place, Oriented to  Time, Oriented to Situation, Oriented to Self   Psych Involvement: No (comment)  Admission diagnosis:  Dehydration [E86.0] Weakness [R53.1] AKI (acute kidney injury) [N17.9] Symptomatic anemia [D64.9] Patient Active Problem List   Diagnosis Date Noted   AKI (acute kidney injury) 08/03/2024   Secondary malignant neoplasm of brain (HCC) 07/18/2024   Wound disruption 07/17/2024   Non-small cell lung cancer (HCC) 07/17/2024   Need for influenza vaccination 07/17/2024   Nausea & vomiting 06/06/2024   Brain mass 06/06/2024   Dysuria 05/19/2024   History of UTI 03/09/2024   Urinary tract infection 01/29/2024   History of CAD (coronary artery disease) 01/29/2024   History of COPD 01/29/2024   Chronic weakness of both lower extremities 01/29/2024  QT prolongation 01/29/2024   COVID-19 vaccination declined 12/22/2023   Chest pain 10/11/2023   Overactive bladder [N32.81] 08/24/2023   Stage 3b chronic kidney disease (HCC) [N18.32] 08/24/2023   Hypertension with renal disease 08/24/2023   Foul smelling urine 06/21/2023   Hematuria 06/21/2023   Closed fracture of left upper limb 06/21/2023   Asymptomatic microscopic hematuria 04/08/2023   Right wrist pain 02/25/2023   Supplemental oxygen  dependent 02/25/2023   Chronic pain of both  knees 02/25/2023   Atherosclerosis of aorta 02/25/2023   Hiatal hernia 11/20/2022   Weakness 11/20/2022   Chronic cough 10/22/2022   Nodule of kidney 09/15/2022   Left ureteral stone 09/07/2022   Gross hematuria 09/07/2022   Chronic hypoxic respiratory failure (HCC) 09/07/2022   Nephrolithiasis 09/07/2022   Staghorn calculus 09/07/2022   Chronic iron  deficiency anemia 04/21/2022   Symptomatic anemia 02/13/2022   Encounter for antineoplastic immunotherapy 11/20/2021   COPD (chronic obstructive pulmonary disease) (HCC) 08/20/2021   Dysphagia 08/20/2021   H/O: lung cancer 07/31/2021   Malignant neoplasm of unspecified part of unspecified bronchus or lung (HCC) 07/25/2021   Encounter for antineoplastic chemotherapy 07/25/2021   Mediastinal adenopathy 07/15/2021   Coronary artery disease of native artery of native heart with stable angina pectoris 04/11/2021   Right bundle branch block 04/11/2021   Newly recognized heart murmur 04/11/2021   Chest pain with moderate risk for cardiac etiology 12/01/2019   Urinary urgency 12/01/2019   Anxiety 02/09/2019   Prediabetes 12/29/2018   Essential hypertension 08/27/2018   Vitamin D  deficiency 06/25/2018   Hyperlipidemia 06/25/2018   Hypothyroidism 06/25/2018   CKD (chronic kidney disease), stage III (HCC) 06/25/2018   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 stage 3b chronic kidney disease (HCC) 06/25/2018   OAB (overactive bladder) 08/26/2016   PCP:  [name], FNP Pharmacy:   [id number] [address], [place] - [address] AT [facility] OF [address] [place] [address] Phone: [phone] Fax: [phone]     Social Drivers of Health (SDOH) Social History: SDOH Screenings   Food Insecurity: No Food Insecurity (08/03/2024)  Housing: Low Risk (08/03/2024)  Transportation Needs: No Transportation Needs (08/03/2024)  Utilities: Not At Risk  (08/03/2024)  Alcohol Screen: Low Risk (03/09/2024)  Depression (PHQ2-9): Low Risk (07/01/2024)  Financial Resource Strain: Medium Risk (03/09/2024)  Physical Activity: Inactive (03/09/2024)  Social Connections: Moderately Isolated (08/03/2024)  Stress: No Stress Concern Present (03/09/2024)  Recent Concern: Stress - Stress Concern Present (03/09/2024)  Tobacco Use: Medium Risk (08/05/2024)  Health Literacy: Adequate Health Literacy (03/09/2024)   SDOH Interventions:     Readmission Risk Interventions    06/07/2024    3:57 PM  Readmission Risk Prevention Plan  Transportation Screening Complete  PCP or Specialist Appt within 3-5 Days Complete  HRI or Home Care Consult Complete  Social Work Consult for Recovery Care Planning/Counseling Complete  Palliative Care Screening Complete  Medication Review Oceanographer) Referral to Pharmacy

## 2024-08-05 NOTE — Consult Note (Signed)
 Abigail [name] 1936/04/28  [id number].    Requesting MD: Dr. 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 Sherrod, chronic hypoxic respiratory failure on 3L O2 at home, CKD, CAD, HTN, anemia, and hypothyroidism who presented to the ED 2 days ago secondary to weakness and fell on the floor.  She has been having poor oral intake at home with chronic diarrhea per the H&P.  She tells me that she has been having some LLQ abdominal pain for the last week.  She has not had any nausea or vomiting.  She is unable to tell me if she has had any blood in her stool.  She had a colonoscopy many years ago, but none any time recently.  She has never had diverticulitis before.  She underwent a work up that revealed a hgb of 6.8, now 7.1 after a transfusion, normal WBC, Cr 1.97, normal lactic acid, and a CT scan showing diffuse diverticular disease with focal wall thickening and mild inflammatory changes of the sigmoid colon which may represent diverticulitis but a mass can not be excluded.  There is a new gas and fluid collection along the left pelvic sidewall suspicious for a focal/contained perforation or abscess.  She also has moderate to severe left hydronephrosis and hydroureter with suspected obstruction secondary to the colonic inflammatory changes.  We have been asked to see the patient for the above findings.  ROS: ROS: see HPI  Family History  Problem Relation Age of Onset   Hypertension Mother    Colon cancer Neg Hx    Stomach cancer Neg Hx    Esophageal cancer Neg Hx     Past Medical History:  Diagnosis Date   Anemia    Arthritis    knees, back   Bacteremia 04/08/2012   CAP (community acquired pneumonia) 04/12/2012   Carotid artery occlusion    Chronic kidney disease     stage 3 ckd no nephrologist   Complete uterine prolapse with prolapse of anterior vaginal wall    Complication of anesthesia    hard to wake up per pt   Constipation    Coronary artery disease    Diverticulitis yrs ago coialitis   Dyspnea    History of blood transfusion    History of radiation therapy    right lung 08/05/2021-09/18/2021  Dr Lynwood Nasuti   Hypertension    Hypothyroid    LLQ abdominal pain 04/05/2012   Lung cancer (HCC)    Numbness    in hands at times   Pneumonia    Pre-diabetes    Scoliosis    STEMI (ST elevation myocardial infarction) (HCC) 10/26/2019   DES RCA   Wears dentures    full dentures   Wears glasses    for reading    Past Surgical History:  Procedure Laterality Date   ANTERIOR AND POSTERIOR REPAIR WITH SACROSPINOUS FIXATION N/A 12/20/2020   Procedure: SACROSPINOUS LIGAMENT FIXATION;  Surgeon: Marilynne Rosaline SAILOR, MD;  Location: [facility];  Service: Gynecology;  Laterality: N/A;  Total time requested for all procedures is 2 hours   BACK SURGERY  1980 age 56   lower   bartholin cyst removal  age 69's   BLADDER SUSPENSION N/A 12/20/2020   Procedure: TRANSVAGINAL TAPE (TVT) PROCEDURE;  Surgeon:  Marilynne Rosaline SAILOR, MD;  Location: [facility];  Service: Gynecology;  Laterality: N/A;   BRONCHIAL BRUSHINGS  07/15/2021   Procedure: BRONCHIAL BRUSHINGS;  Surgeon: Shelah Lamar RAMAN, MD;  Location: [facility] ENDOSCOPY;  Service: Pulmonary;;   BRONCHIAL NEEDLE ASPIRATION BIOPSY  07/15/2021   Procedure: BRONCHIAL NEEDLE ASPIRATION BIOPSIES;  Surgeon: Shelah Lamar RAMAN, MD;  Location: [facility] ENDOSCOPY;  Service: Pulmonary;;   CORONARY/GRAFT ACUTE MI REVASCULARIZATION N/A 10/26/2019   Procedure: Coronary/Graft Acute MI Revascularization;  Surgeon: Wonda Sharper, MD;  Location: [facility] INVASIVE CV LAB;  Service: Cardiovascular;  Laterality: N/A;   CYSTOCELE REPAIR N/A 12/20/2020   Procedure: ANTERIOR AND POSTERIOR REPAIR WITH PERINEORRHAPHY;   Surgeon: Marilynne Rosaline SAILOR, MD;  Location: [facility];  Service: Gynecology;  Laterality: N/A;   CYSTOSCOPY N/A 12/20/2020   Procedure: CYSTOSCOPY;  Surgeon: Marilynne Rosaline SAILOR, MD;  Location: [facility];  Service: Gynecology;  Laterality: N/A;   ELBOW SURGERY  1990's   left   ENDOBRONCHIAL ULTRASOUND N/A 07/15/2021   Procedure: ENDOBRONCHIAL ULTRASOUND;  Surgeon: Shelah Lamar RAMAN, MD;  Location: [facility] ENDOSCOPY;  Service: Pulmonary;  Laterality: N/A;   HEMOSTASIS CONTROL  07/15/2021   Procedure: HEMOSTASIS CONTROL;  Surgeon: Shelah Lamar RAMAN, MD;  Location: [facility] ENDOSCOPY;  Service: Pulmonary;;   LEFT HEART CATH AND CORONARY ANGIOGRAPHY N/A 10/26/2019   Procedure: LEFT HEART CATH AND CORONARY ANGIOGRAPHY;  Surgeon: Wonda Sharper, MD;  Location: [facility] INVASIVE CV LAB;  Service: Cardiovascular;  Laterality: N/A;   TONSILLECTOMY      Social History:  reports that she quit smoking about 38 years ago. Her smoking use included cigarettes. She started smoking about 73 years ago. She has a 52.5 pack-year smoking history. She has never used smokeless tobacco. She reports that she does not drink alcohol and does not use drugs.  Allergies: Allergies[1]  Medications Prior to Admission  Medication Sig Dispense Refill   acetaminophen  (TYLENOL ) 500 MG tablet Take 1,000 mg by mouth every 6 (six) hours as needed (for pain).     albuterol  (VENTOLIN  HFA) 108 (90 Base) MCG/ACT inhaler TAKE 2 PUFFS BY MOUTH EVERY 6 HOURS AS NEEDED FOR WHEEZE OR SHORTNESS OF BREATH (Patient taking differently: Inhale 2 puffs into the lungs every 6 (six) hours as needed for wheezing or shortness of breath.) 8.5 each 1   aspirin  EC 81 MG tablet Take 81 mg by mouth at bedtime.     atorvastatin  (LIPITOR ) 80 MG tablet Take 40 mg by mouth every evening.     BREZTRI  AEROSPHERE 160-9-4.8 MCG/ACT AERO inhaler INHALE 2 PUFFS INTO THE LUNGS IN THE MORNING AND ALSO 2 PUFFS AT BEDTIME 10.7 each 6   Evolocumab   (REPATHA  SURECLICK) 140 MG/ML SOAJ Inject 140 mg into the skin every 14 (fourteen) days. 6 mL 3   feeding supplement (ENSURE PLUS HIGH PROTEIN) LIQD Take 237 mLs by mouth 2 (two) times daily between meals. (Patient taking differently: Take 237 mLs by mouth daily.)     ferrous sulfate  325 (65 FE) MG EC tablet TAKE 1 TABLET BY MOUTH EVERY DAY WITH BREAKFAST (Patient taking differently: Take 325 mg by mouth daily with breakfast.) 30 tablet 2   HYDROcodone -acetaminophen  (NORCO/VICODIN) 5-325 MG tablet Take 1-2 tablets by mouth every 6 (six) hours as needed for moderate pain (pain score 4-6). 20 tablet 0   levothyroxine  (SYNTHROID ) 175 MCG tablet TAKE 1 TABLET BY MOUTH EVERY MORNING ON MONDAY - FRIDAY (Patient taking differently: Take 175 mcg by mouth. Take 175 mcg  by mouth 30 minutes before breakfast on Mon/Tues/Wed/Thurs/Fri- not on Saturdays or Sundays) 90 tablet 1   metoprolol  succinate (TOPROL -XL) 50 MG 24 hr tablet Take 50 mg by mouth every evening.     nitrofurantoin  (MACRODANTIN ) 100 MG capsule Take 100 mg by mouth daily.     nitrofurantoin , macrocrystal-monohydrate, (MACROBID ) 100 MG capsule Take 100 mg by mouth daily.     nitroGLYCERIN  (NITROSTAT ) 0.4 MG SL tablet PLACE 1 TABLET UNDER THE TONGUE EVERY 5 (FIVE) MINUTES X 3 DOSES AS NEEDED FOR CHEST PAIN. 25 tablet 3   OXYGEN  Inhale 3 L/min into the lungs continuous.     polyethylene glycol powder (GLYCOLAX /MIRALAX ) 17 GM/SCOOP powder Dissolve 1 capful (17g) in 4-8 ounces of liquid and take by mouth daily as needed for mild constipation. 238 g 0   silver  sulfADIAZINE  (SILVADENE ) 1 % cream Apply to affected area twice a week 50 g 1   dexamethasone  (DECADRON ) 2 MG tablet Take 4 tablets daily with breakfast through 10/29. Reduce to 2 tablets daily with breakfast from 10/30 through 11/12. Reduce to 1 tablet daily with breakfast from 11/13 through 11/27. Last dose: 11/27. (Patient not taking: Reported on 08/03/2024) 70 tablet 0   estradiol  (ESTRACE ) 0.1  MG/GM vaginal cream Place 0.5g twice a week at opening of vagina (Patient not taking: Reported on 08/03/2024) 42.5 g 11   fluconazole  (DIFLUCAN ) 100 MG tablet Take 2 tablets today, then 1 tablet daily x 13 more days. HOLD ATORVASTATIN  WHILE ON THIS. (Patient not taking: Reported on 08/03/2024) 15 tablet 0   trospium  (SANCTURA ) 20 MG tablet Take 1 tablet (20 mg total) by mouth 2 (two) times daily. (Patient not taking: Reported on 08/03/2024) 180 tablet 3     Physical Exam: Blood pressure (!) 119/52, pulse 74, temperature 98 F (36.7 C), temperature source Oral, resp. rate 19, height 5' 6 (1.676 m), weight 71.5 kg, SpO2 92%. General: elderly, chronically-ill appearing, white female who is laying in bed in NAD HEENT: head is normocephalic, atraumatic.  Sclera are noninjected.  PERRL.  Ears and nose without any masses or lesions.  Mouth is pink and moist Heart: regular, rate, and rhythm.  Normal s1,s2. No obvious murmurs, gallops, or rubs noted.   Lungs: CTAB, no wheezes, rhonchi, or rales noted.  Respiratory effort nonlabored on 3L of O2 Abd: soft, minimal tenderness in LLQ, no peritonitis or guarding, ND, +BS, no masses, hernias, or organomegaly Psych: A&Ox3 with an appropriate affect.   Results for orders placed or performed during the hospital encounter of 08/03/24 (from the past 48 hours)  Urinalysis, Routine w reflex microscopic -Urine, Clean Catch     Status: Abnormal   Collection Time: 08/03/24 12:28 PM  Result Value Ref Range   Color, Urine AMBER (A) YELLOW    Comment: BIOCHEMICALS MAY BE AFFECTED BY COLOR   APPearance CLOUDY (A) CLEAR   Specific Gravity, Urine 1.014 1.005 - 1.030   pH 5.0 5.0 - 8.0   Glucose, UA NEGATIVE NEGATIVE mg/dL   Hgb urine dipstick SMALL (A) NEGATIVE   Bilirubin Urine NEGATIVE NEGATIVE   Ketones, ur NEGATIVE NEGATIVE mg/dL   Protein, ur 30 (A) NEGATIVE mg/dL   Nitrite NEGATIVE NEGATIVE   Leukocytes,Ua LARGE (A) NEGATIVE   RBC / HPF 0-5 0 - 5 RBC/hpf    WBC, UA >50 0 - 5 WBC/hpf   Bacteria, UA MANY (A) NONE SEEN   Squamous Epithelial / HPF 6-10 0 - 5 /HPF   Mucus PRESENT  Comment: Performed at [facility], [address], [place], [place] [address]  Urine Culture (for pregnant, neutropenic or urologic patients or patients with an indwelling urinary catheter)     Status: Abnormal   Collection Time: 08/03/24 12:28 PM   Specimen: Urine, Clean Catch  Result Value Ref Range   Specimen Description      URINE, CLEAN CATCH Performed at [facility], [address], [place], [place] [address]    Special Requests      NONE Performed at [facility], [address] [place], [place] [address]    Culture (A)     30,000 COLONIES/mL PSEUDOMONAS AERUGINOSA WITHIN MIXED ORGANISMS Performed at [facility], [address], [place], [place] [address]    Report Status 08/05/2024 FINAL    Organism ID, Bacteria PSEUDOMONAS AERUGINOSA (A)       Susceptibility   Pseudomonas aeruginosa - MIC*    MEROPENEM <=0.25 SENSITIVE Sensitive     CIPROFLOXACIN  >=4 RESISTANT Resistant     IMIPENEM 2 SENSITIVE Sensitive     PIP/TAZO Value in next row Sensitive      16 SENSITIVEThis is a modified FDA-approved test that has been validated and its performance characteristics determined by the reporting laboratory.  This laboratory is certified under the Clinical Laboratory Improvement Amendments CLIA as qualified to perform high complexity clinical laboratory testing.    CEFTAZIDIME /AVIBACTAM Value in next row Sensitive      16 SENSITIVEThis is a modified FDA-approved test that has been validated and its performance characteristics determined by the reporting laboratory.  This laboratory is certified under the Clinical Laboratory Improvement Amendments CLIA as qualified to perform high complexity clinical laboratory testing.    CEFTOLOZANE/TAZOBACTAM Value in next row Sensitive      16 SENSITIVEThis is a  modified FDA-approved test that has been validated and its performance characteristics determined by the reporting laboratory.  This laboratory is certified under the Clinical Laboratory Improvement Amendments CLIA as qualified to perform high complexity clinical laboratory testing.    TOBRAMYCIN Value in next row Sensitive      16 SENSITIVEThis is a modified FDA-approved test that has been validated and its performance characteristics determined by the reporting laboratory.  This laboratory is certified under the Clinical Laboratory Improvement Amendments CLIA as qualified to perform high complexity clinical laboratory testing.    CEFTAZIDIME  Value in next row Sensitive      16 SENSITIVEThis is a modified FDA-approved test that has been validated and its performance characteristics determined by the reporting laboratory.  This laboratory is certified under the Clinical Laboratory Improvement Amendments CLIA as qualified to perform high complexity clinical laboratory testing.    * 30,000 COLONIES/mL PSEUDOMONAS AERUGINOSA  Iron  and TIBC     Status: Abnormal   Collection Time: 08/03/24  2:36 PM  Result Value Ref Range   Iron  10 (L) 28 - 170 ug/dL   TIBC 772 (L) 749 - 549 ug/dL   Saturation Ratios 5 (L) 10.4 - 31.8 %   UIBC 217 ug/dL    Comment: Performed at [facility], [address], [place], [place] [address]  Prepare RBC (crossmatch)     Status: None   Collection Time: 08/03/24  6:48 PM  Result Value Ref Range   Order Confirmation      ORDER PROCESSED BY BLOOD BANK Performed at [facility], [address], [place], [place] [address]   Type and screen [facility]     Status: None   Collection Time: 08/03/24  6:48 PM  Result Value Ref Range   ABO/RH(D) B POS    Antibody Screen NEG    Sample Expiration 08/06/2024,2359    Unit Number [id number]    Blood Component Type RBC LR PHER2    Unit division 00    Status of Unit  ISSUED,FINAL    Transfusion Status OK TO TRANSFUSE    Crossmatch Result      Compatible Performed at [facility], [address], [place], [place] [address]   Basic metabolic panel     Status: Abnormal   Collection Time: 08/04/24  4:20 AM  Result Value Ref Range   Sodium 136 135 - 145 mmol/L   Potassium 4.0 3.5 - 5.1 mmol/L   Chloride 104 98 - 111 mmol/L   CO2 21 (L) 22 - 32 mmol/L   Glucose, Bld 125 (H) 70 - 99 mg/dL    Comment: Glucose reference range applies only to samples taken after fasting for at least 8 hours.   BUN 50 (H) 8 - 23 mg/dL   Creatinine, Ser 8.03 (H) 0.44 - 1.00 mg/dL   Calcium  7.8 (L) 8.9 - 10.3 mg/dL   GFR, Estimated 24 (L) >60 mL/min    Comment: (NOTE) Calculated using the CKD-EPI Creatinine Equation (2021)    Anion gap 11 5 - 15    Comment: Performed at [facility], [address], [place], [place] [address]  CBC     Status: Abnormal   Collection Time: 08/04/24  4:20 AM  Result Value Ref Range   WBC 7.4 4.0 - 10.5 K/uL   RBC 2.68 (L) 3.87 - 5.11 MIL/uL   Hemoglobin 7.3 (L) 12.0 - 15.0 g/dL   HCT 76.4 (L) 63.9 - 53.9 %   MCV 87.7 80.0 - 100.0 fL   MCH 27.2 26.0 - 34.0 pg   MCHC 31.1 30.0 - 36.0 g/dL   RDW 83.3 (H) 88.4 - 84.4 %   Platelets 240 150 - 400 K/uL   nRBC 0.0 0.0 - 0.2 %    Comment: Performed at [facility], [address], [place], [place] [address]  Glucose, capillary     Status: Abnormal   Collection Time: 08/04/24 11:44 PM  Result Value Ref Range   Glucose-Capillary 124 (H) 70 - 99 mg/dL    Comment: Glucose reference range applies only to samples taken after fasting for at least 8 hours.  Lactic acid, plasma     Status: None   Collection Time: 08/05/24  1:20 AM  Result Value Ref Range   Lactic Acid, Venous 0.7 0.5 - 1.9 mmol/L    Comment: Performed at [facility], [address], [place], [place] [address]  Basic metabolic panel with GFR     Status:  Abnormal   Collection Time: 08/05/24  1:20 AM  Result Value Ref Range   Sodium 134 (L) 135 - 145 mmol/L   Potassium 4.0 3.5 - 5.1 mmol/L   Chloride 102 98 - 111 mmol/L   CO2 21 (L) 22 - 32 mmol/L   Glucose, Bld 130 (H) 70 - 99 mg/dL    Comment: Glucose reference range applies only to samples taken after fasting for at least 8 hours.   BUN 47 (H) 8 - 23 mg/dL   Creatinine, Ser 8.02 (H) 0.44 - 1.00 mg/dL   Calcium  7.8 (L) 8.9 - 10.3 mg/dL   GFR, Estimated  24 (L) >60 mL/min    Comment: (NOTE) Calculated using the CKD-EPI Creatinine Equation (2021)    Anion gap 10 5 - 15    Comment: Performed at [facility], [address] [place], [place] [address]  CBC     Status: Abnormal   Collection Time: 08/05/24  1:20 AM  Result Value Ref Range   WBC 8.1 4.0 - 10.5 K/uL   RBC 2.57 (L) 3.87 - 5.11 MIL/uL   Hemoglobin 7.1 (L) 12.0 - 15.0 g/dL   HCT 77.0 (L) 63.9 - 53.9 %   MCV 89.1 80.0 - 100.0 fL   MCH 27.6 26.0 - 34.0 pg   MCHC 31.0 30.0 - 36.0 g/dL   RDW 83.0 (H) 88.4 - 84.4 %   Platelets 243 150 - 400 K/uL   nRBC 0.0 0.0 - 0.2 %    Comment: Performed at [facility], [address], [place], [place] [address]  Magnesium      Status: None   Collection Time: 08/05/24  1:20 AM  Result Value Ref Range   Magnesium  2.0 1.7 - 2.4 mg/dL    Comment: Performed at [facility], [address] [place], [place] [address]  Phosphorus     Status: None   Collection Time: 08/05/24  1:20 AM  Result Value Ref Range   Phosphorus 3.3 2.5 - 4.6 mg/dL    Comment: Performed at [facility], [address], [place], [place] [address]  Glucose, capillary     Status: Abnormal   Collection Time: 08/05/24  3:40 AM  Result Value Ref Range   Glucose-Capillary 113 (H) 70 - 99 mg/dL    Comment: Glucose reference range applies only to samples taken after fasting for at least 8 hours.  Glucose, capillary     Status: Abnormal   Collection  Time: 08/05/24  7:53 AM  Result Value Ref Range   Glucose-Capillary 123 (H) 70 - 99 mg/dL    Comment: Glucose reference range applies only to samples taken after fasting for at least 8 hours.   CT ABDOMEN PELVIS WO CONTRAST Result Date: 08/04/2024 CLINICAL DATA:  Left lower quadrant pain EXAM: CT ABDOMEN AND PELVIS WITHOUT CONTRAST TECHNIQUE: Multidetector CT imaging of the abdomen and pelvis was performed following the standard protocol without IV contrast. RADIATION DOSE REDUCTION: This exam was performed according to the departmental dose-optimization program which includes automated exposure control, adjustment of the mA and/or kV according to patient size and/or use of iterative reconstruction technique. COMPARISON:  CT 06/06/2024, 10/29/2023, 01/30/2024 FINDINGS: Lower chest: Advanced aortic atherosclerosis. Coronary vascular calcification. Cardiomegaly. Moderate to large hiatal hernia. Partially imaged slightly loculated appearing small moderate left pleural effusion. Mild subpleural atelectasis or minimal aspiration in the posterior right lung base. Hepatobiliary: No focal liver abnormality is seen. No gallstones, gallbladder wall thickening, or biliary dilatation. Pancreas: Unremarkable. No pancreatic ductal dilatation or surrounding inflammatory changes. Spleen: Normal in size without focal abnormality. Adrenals/Urinary Tract: Adrenal glands are normal. Mild cortical atrophy of the left kidney. Multiple cysts and cortical hyperdensities, limited characterization without contrast but better characterized on prior MRI from 2024, no specific imaging follow-up recommended. 10 mm stone within the mid left kidney with additional smaller left kidney stones. Interim development of moderate severe left hydronephrosis and hydroureter, ureter dilated to the level of the pelvis, the distal ureter proximal to the bladder is decompressed. Bladder is unremarkable. Stomach/Bowel: Stomach is nondistended. No dilated  small bowel. Diffuse diverticular disease of the  colon. Focal wall thickening and mild inflammatory change at the sigmoid colon. New gas and fluid collection along the left pelvic sidewall, estimated measurement of 5.9 x 2.3 cm on series 2, image 63. Further assessment limited without contrast. Smaller gas and fluid collection in the left inferior pelvis, series 2, image 68. Vascular/Lymphatic: Aortic atherosclerosis. No enlarged abdominal or pelvic lymph nodes. Reproductive: Negative for adnexal mass Other: No ascites.  Small fat containing umbilical hernia Musculoskeletal: Scoliosis and degenerative changes. No acute osseous abnormality. IMPRESSION: 1. Diffuse diverticular disease of the colon. Focal wall thickening and mild inflammatory change at the sigmoid colon, probably representing diverticulitis with colon mass not excluded. New gas and fluid collection along the left pelvic sidewall measuring up to 5.9 cm, suspicious for focal/contained perforation or abscess, assessment limited in the absence of intravenous contrast. Smaller gas and fluid collection in the left inferior pelvis, this is indeterminate for an additional gas and fluid collection versus fluid containing inflamed large diverticulum. 2. Interim development of moderate to severe left hydronephrosis and hydroureter, ureter dilated to the level of the pelvis/area of inflammatory change at sigmoid colon. Suspect obstruction of the distal left ureter by the colon inflammatory changes and gas and fluid collection. 3. Partially imaged slightly loculated appearing small to moderate left pleural effusion. Mild subpleural atelectasis or minimal aspiration in the posterior right lung base. 4. Moderate to large hiatal hernia. 5. Aortic atherosclerosis. These results will be called to the ordering clinician or representative by the Radiologist Assistant, and communication documented in the PACS or [employer]. Aortic Atherosclerosis (ICD10-I70.0).  Electronically Signed   By: Luke Bun M.D.   On: 08/04/2024 22:20      Assessment/Plan Diverticulitis with abscess, mass not excluded The patient has been seen, examined, labs, vitals, chart, and imaging personally reviewed.  She appears to likely have an air/fluid collection in her pelvis from suspected diverticulitis.  She could have an underlying mass, but difficult to tell.  She has had previous colonoscopies, but many years ago.  For now, she has no leukocytosis and her abdominal exam is quite benign.  This collection does not appear to be drainable based on location, so we would recommend conservative management with abx therapy.  The patient has numerous medical issues, including, non-small cell lung cancer with mets to her brain.  Overall, if she were to decompensate and need surgical intervention, she would be a poor candidate.  We briefly discussed that would result in a colectomy, colostomy and then likely a skilled nursing facility with significant decline in her quality of life.  Would recommend continues discussions for goals of care and conservative management.  She may have CLD from our standpoint.   FEN - CLd VTE - none due to anemia ID - Rocephin /Flagyl  Non-small cell lung cancer with mets to brain HTN Anemia Chronic hypoxic respiratory failure on 3L O2 at home Hypothyroidism Recent falls/weak CKD CAD  I reviewed Consultant palliative care, oncology notes, hospitalist notes, last 24 h vitals and pain scores, last 48 h intake and output, last 24 h labs and trends, and last 24 h imaging results.  Burnard FORBES Banter, [facility] 08/05/2024, 9:54 AM Please see Amion for pager number during day hours 7:00am-4:30pm or 7:00am -11:30am on weekends      [1]  Allergies Allergen Reactions   Codeine Nausea And Vomiting   Norvasc  [Amlodipine ] Rash

## 2024-08-05 NOTE — Evaluation (Signed)
 Occupational Therapy Evaluation Patient Details Name: Abigail [name] MRN: [id number] DOB: April 25, 1936 Today's Date: 08/05/2024   History of Present Illness   Pt is an 88y.o female admitted on 08/03/24 with generalized weakness. She fell at home while trying to put her pants on and was unable to get up. PMH includes: metastatic non-small lung cancer to brain, currently on radiation treatment, chronic hypoxic respiratory failure on 3 L O2, CKD stage IIIb, 2 skin cancers on her legs s/p resection, hypertension, coronary artery disease, chronic normocytic anemia, hypothyroidism.     Clinical Impressions PTA, patient lives at home with son and was relatively indep with all aspects of ADL's and mobility with walker and O2 at baseline. Patient works FT as a investment banker, operational and she has 2 dogs she cares for.  Currently, patient presents with deficits outlined below (see OT Problem List for details) most significantly decreased generalized muscle strength, balance, activity tolerance and mild cognitive deficits limiting BADL's and functional mobility performance. Recommending HHOT services upon hospital discharge and caregiver support and assistance.  Patient requires continued Acute care hospital level OT services to progress safety and functional performance and allow for discharge.       If plan is discharge home, recommend the following:   A lot of help with walking and/or transfers;A lot of help with bathing/dressing/bathroom;Assistance with cooking/housework;Assist for transportation;Help with stairs or ramp for entrance     Functional Status Assessment   Patient has had a recent decline in their functional status and demonstrates the ability to make significant improvements in function in a reasonable and predictable amount of time.     Equipment Recommendations   None recommended by OT      Precautions/Restrictions   Precautions Precautions: None Recall of Precautions/Restrictions:  Intact Precaution/Restrictions Comments: needs oxygen  at baseline however takes off intermittently, doesn't monitor O2 levels, poor awareness of when O2 is low when she removes [place] Restrictions Weight Bearing Restrictions Per Provider Order: No     Mobility Bed Mobility Overal bed mobility: Needs Assistance Bed Mobility: Supine to Sit     Supine to sit: Used rails, HOB elevated, Contact guard     General bed mobility comments: min cues for use of bed features, increased time    Transfers Overall transfer level: Needs assistance Equipment used: Rolling walker (2 wheels) Transfers: Sit to/from Stand Sit to Stand: Min assist, Mod assist           General transfer comment: increased time with min cues for hand placement      Balance Overall balance assessment: Needs assistance Sitting-balance support: Feet supported, Bilateral upper extremity supported Sitting balance-Leahy Scale: Fair     Standing balance support: Bilateral upper extremity supported, During functional activity Standing balance-Leahy Scale: Fair                             ADL either performed or assessed with clinical judgement   ADL Overall ADL's : Needs assistance/impaired Eating/Feeding: Set up;Sitting   Grooming: Wash/dry hands;Wash/dry face;Oral care;Brushing hair;Set up;Sitting   Upper Body Bathing: Contact guard assist;Sitting   Lower Body Bathing: Moderate assistance;Sit to/from stand   Upper Body Dressing : Set up;Sitting   Lower Body Dressing: Moderate assistance;Sit to/from stand   Toilet Transfer: Minimal assistance;Moderate assistance;Rolling walker (2 wheels);Stand-pivot   Toileting- Clothing Manipulation and Hygiene: Minimal assistance;Sitting/lateral lean Toileting - Clothing Manipulation Details (indicate cue type and reason): has Foley currently     Functional mobility  during ADLs: Minimal assistance;Moderate assistance;Rolling walker (2 wheels) General ADL  Comments: decreasedfunctional reach to LE's, issued reacher to assist     Vision Baseline Vision/History: 1 Wears glasses;0 No visual deficits              Pertinent Vitals/Pain Pain Assessment Pain Assessment: Faces Faces Pain Scale: Hurts a little bit Pain Location: back, shoulders Pain Descriptors / Indicators: Dull, Grimacing, Guarding Pain Intervention(s): Monitored during session, Repositioned     Extremity/Trunk Assessment Upper Extremity Assessment Upper Extremity Assessment: Generalized weakness;Right hand dominant   Lower Extremity Assessment LLE Sensation:  (near wound on shin)   Cervical / Trunk Assessment Cervical / Trunk Assessment: Kyphotic   Communication Communication Communication: No apparent difficulties   Cognition Arousal: Alert Behavior During Therapy: Flat affect, WFL for tasks assessed/performed Cognition: Cognition impaired     Awareness: Online awareness impaired Memory impairment (select all impairments): Short-term memory Attention impairment (select first level of impairment): Selective attention Executive functioning impairment (select all impairments): Problem solving OT - Cognition Comments: higher level processing delays                 Following commands: Intact       Cueing  General Comments   Cueing Techniques: Verbal cues  no skin issues noted, on 2 ltrs O2 via [place] with SpO2 96% throughout session   Exercises Exercises: Other exercises (issued and trained in foam grasp cube squeezes q hr 10 reps Bly)        Home Living Family/patient expects to be discharged to:: Private residence Living Arrangements: Children (son, [name]) Available Help at Discharge: Family;Available PRN/intermittently (pt home alone during day as son works) Type of Home: House Home Access: Stairs to enter Secretary/administrator of Steps: 1 Entrance Stairs-Rails: None Home Layout: One level     Bathroom Shower/Tub: Scientist, Research (life Sciences): Handicapped height     Home Equipment: Shower seat;Grab bars - tub/shower;Hand held shower head;Toilet riser;Rollator (4 wheels);Cane - quad   Additional Comments: on 3L O2 at baseline however removes to walk to bathroom. Pt reports that son isn't able to reliably help her and they take care of their own things at home regarding IADLs      Prior Functioning/Environment Prior Level of Function : Independent/Modified Independent             Mobility Comments: walks with rollator, on 3L O2 24/7 but then reports taking it off to go to the bathroom as she has tripped over O2 line when in bathroom before, denies falls in past 6 months ADLs Comments: doesn't drive; independent with showering using shower seat, assist with groceries    OT Problem List: Decreased strength;Decreased activity tolerance;Impaired balance (sitting and/or standing);Decreased cognition;Cardiopulmonary status limiting activity;Impaired UE functional use;Pain   OT Treatment/Interventions: Self-care/ADL training;Therapeutic exercise;Neuromuscular education;Energy conservation;DME and/or AE instruction;Therapeutic activities;Patient/family education;Balance training      OT Goals(Current goals can be found in the care plan section)   Acute Rehab OT Goals Patient Stated Goal: to get stronger for my dogs OT Goal Formulation: With patient Time For Goal Achievement: 08/19/24 Potential to Achieve Goals: Fair ADL Goals Pt Will Perform Lower Body Bathing: with supervision;with adaptive equipment;sit to/from stand Pt Will Perform Lower Body Dressing: with contact guard assist;with adaptive equipment;sit to/from stand Pt Will Transfer to Toilet: with contact guard assist;bedside commode;ambulating Pt Will Perform Toileting - Clothing Manipulation and hygiene: with contact guard assist;sit to/from stand Pt Will Perform Tub/Shower Transfer: with contact guard assist;ambulating;Shower transfer;shower  seat Pt/caregiver will Perform Home Exercise Program: Increased strength;With written HEP provided;Both right and left upper extremity;With Supervision Additional ADL Goal #1: Patient will teach back 4/5 ECT's for ADL's and mobility with min cues   OT Frequency:  Min 2X/week       AM-PAC OT 6 Clicks Daily Activity     Outcome Measure Help from another person eating meals?: A Little Help from another person taking care of personal grooming?: A Little Help from another person toileting, which includes using toliet, bedpan, or urinal?: A Little Help from another person bathing (including washing, rinsing, drying)?: A Little Help from another person to put on and taking off regular upper body clothing?: A Little Help from another person to put on and taking off regular lower body clothing?: A Little 6 Click Score: 18   End of Session Equipment Utilized During Treatment: Gait belt;Rolling walker (2 wheels);Oxygen  Nurse Communication: Mobility status  Activity Tolerance: Patient tolerated treatment well Patient left: in chair;with call bell/phone within reach;with chair alarm set;with SCD's reapplied  OT Visit Diagnosis: Unsteadiness on feet (R26.81);Muscle weakness (generalized) (M62.81);Cognitive communication deficit (R41.841);Pain Pain - part of body:  (back)                Time: 1520-1600 OT Time Calculation (min): 40 min Charges:  OT General Charges $OT Visit: 1 Visit OT Evaluation $OT Eval Low Complexity: 1 Low OT Treatments $Self Care/Home Management : 8-22 mins  Mylissa Lambe OT/L Acute Rehabilitation Department  [phone]  08/05/2024, 5:11 PM

## 2024-08-05 NOTE — Progress Notes (Signed)
 Foley was placed due to retention. When patient was admitted on 08-03-2024 day shift nurse bladder scanned 800 and in and out cath 700 ml of urine. Unfortunately it was not charted. We have been giving iv fluids and patient was in out cath on 08-04-2024 400 ml of urine around 630 am.  The patient has continued to bladder scans and the volume scans was not sufficient for in and out cath.

## 2024-08-05 NOTE — Progress Notes (Signed)
 Abigail Homer, Ms. Graf daughter, would like a doctor to call and give an update on the plan of care for her mother please. I told her someone would call her in the morning as soon as they could. Her number is [phone]. She has many questions and concerns about the next steps. Thanks!

## 2024-08-05 NOTE — Progress Notes (Incomplete)
 PROGRESS NOTE  [name]  FMW:[id number] DOB: [birth year] DOA: 08/03/2024 PCP: [name], FNP   Brief Narrative:  Abigail [name] is a 88 y.o. female with medical history significant of metastatic non-small lung cancer to brain, currently on radiation treatment, chronic hypoxic respiratory failure on 3 L of oxygen  per minute, CKD stage IIIb, 2 skin cancers on her legs s/p resection, hypertension, coronary artery disease, COPD on 3 L of oxygen  per minute, chronic normocytic anemia, hypothyroidism who presented from home with complaint of generalized weakness.  Labs showed creatinine of 2.2, hemoglobin of 6.7.  Started on IV fluids and given a unit of PRBC transfusion.  PT/OT consulted.  Oncology also consulted.  08/05/2024: Patient seen.  Patient continues to report weakness.  Patient Particularly great historian.  Assessment & Plan:  Principal Problem:   AKI (acute kidney injury) Active Problems:   Hyperlipidemia   Hypothyroidism   CKD (chronic kidney disease), stage III (HCC)   Essential hypertension   Coronary artery disease of native artery of native heart with stable angina pectoris   Malignant neoplasm of unspecified part of unspecified bronchus or lung (HCC)   COPD (chronic obstructive pulmonary disease) (HCC)   Symptomatic anemia   Chronic iron  deficiency anemia   Weakness   Urinary tract infection   Non-small cell lung cancer (HCC)   AKI on CKD stage IIIb: - Baseline creatinine around 1.6. - Admitted with serum creatinine of 2.2 -Poor oral intake prior to presentation. - Continue IV fluid. - Serum creatinine of 1.97 today.   - Continue to monitor renal function and electrolytes. - Imaging studies revealed left hydronephrosis and hydroureter.   Metastatic non-small cell lung cancer with solitary cerebral brain metastasis: -Follows with Dr. Sherrod.  Completed concurrent chemoradiation and 1 year of Durvalumab  consolidation therapy.  Currently on stereotactic  radiosurgery for brain mets.  She completed the course of Decadron .  Follows with radiation oncology.   oncology/Dr. Sherrod consulted   Chronic hypoxic respiratory failure/COPD:  -Currently not on exacerbation.  On 3 L of oxygen  chronically at home.  Continue bronchodilators, inhalers.   -Not in acute exacerbation at the moment.   Acute on chronic normocytic anemia:  -Baseline hemoglobin around 7-8. - Admitted with hemoglobin of 6.8 g/dL.  Likely associated with malignancy.  No evidence of acute blood loss.   -Given a unit of PRBC.   - Hemoglobin of 7.1 g/dL today. - Continue to monitor closely.  -Iron  panel reveals iron  of 10, TSAT of 5, TIBC of 227. -Likely anemia of chronic inflammation.  Patient follows up with oncology team. -Continue ferrous sulfate .   Hypertension: - Blood pressure is controlled.   -Takes metoprolol  at home.  Currently on hold.     History of coronary artery disease/elevated troponin:  -On aspirin , Lipitor , Repatha .  No anginal symptoms.  Takes aspirin  at home, currently on hold, will resume on discharge if no evidence of GI bleed. Denies any chest pain today.  Elevated troponin is likely from demand ischemia from anemia.   History of skin cancer:  -Underwent resection of the skin tumor on the left leg on October of this year.  Wound care consulted.  Wound appears to be slightly infected with purulent material.  Continue ceftriaxone    History of recurrent UTI:  -On chronic therapy with nitrofurantoin . She has  noted frequency, dysuria symptoms.  Also complained of left lower quadrant pain.  Checking CT abdomen/pelvis without contrast.  Started on ceftriaxone .  Urine culture showing 30,000 colonies of  Pseudomonas along with mixed organisms. 08/05/2024: Will likely discontinue Rocephin .  Consult pharmacy for meropenem .     Debility/deconditioning/generalized weakness:  -Patient lives with son.But on son is at at work all time time.  Will consult PT.  Ambulates  with rollator.  Son is agreeable for SNF if needed 12/25: Further PT/OT recommendation  Diffuse diverticular disease of the colon/diverticulitis of sigmoid colon with colon mass not excluded/suspicious focal/contained perforation and abscess: - Continue antibiotics.   Goals of care: Elderly patient with multiple comorbidities including metastatic lung cancer.  We consulted palliative care for goals of care.  I have a long discussion with her son Abigail [name] on phone today.  He understands her poor quality of life.  He is interested to discuss goals of care.       DVT prophylaxis:SCDs Start: 08/03/24 1141     Code Status: Full Code  Family Communication:   Patient status: Inpatient  Patient is from : Home  Anticipated discharge to: Home versus SNF  Estimated DC date: 2-3 days   Consultants: Oncology  Procedures: None  Antimicrobials:  Anti-infectives (From admission, onward)    Start     Dose/Rate Route Frequency Ordered Stop   08/04/24 2345  metroNIDAZOLE  (FLAGYL ) IVPB 500 mg        500 mg 100 mL/hr over 60 Minutes Intravenous Every 12 hours 08/04/24 2256     08/04/24 1300  cefTRIAXone  (ROCEPHIN ) 2 g in sodium chloride  0.9 % 100 mL IVPB  Status:  Discontinued        2 g 200 mL/hr over 30 Minutes Intravenous Every 24 hours 08/03/24 1220 08/03/24 1236   08/03/24 1300  cefTRIAXone  (ROCEPHIN ) 2 g in sodium chloride  0.9 % 100 mL IVPB        2 g 200 mL/hr over 30 Minutes Intravenous Every 24 hours 08/03/24 1236     08/03/24 1200  cefTRIAXone  (ROCEPHIN ) 1 g in sodium chloride  0.9 % 100 mL IVPB  Status:  Discontinued        1 g 200 mL/hr over 30 Minutes Intravenous Every 24 hours 08/03/24 1154 08/03/24 1220   08/03/24 1145  nitrofurantoin  (MACRODANTIN ) capsule 100 mg  Status:  Discontinued        100 mg Oral Daily 08/03/24 1143 08/03/24 1154       Subjective: Patient seen and examined Patient continues to report feeling weak.    Objective: Vitals:   08/04/24 2119 08/05/24  0340 08/05/24 0845 08/05/24 1155  BP: (!) 111/43 (!) 119/52  (!) 113/95  Pulse: 81 74  81  Resp: 12 19  18   Temp: 98.4 F (36.9 C) 98 F (36.7 C)  98.7 F (37.1 C)  TempSrc: Oral Oral  Oral  SpO2: 95% 94% 92% 92%  Weight:      Height:        Intake/Output Summary (Last 24 hours) at 08/05/2024 1715 Last data filed at 08/05/2024 1400 Gross per 24 hour  Intake 1862.48 ml  Output 400 ml  Net 1462.48 ml   Filed Weights   08/03/24 0908 08/03/24 1359  Weight: 70.8 kg 71.5 kg    Examination:  General exam: Chronically ill looking.   Respiratory system: Clear to auscultation. Cardiovascular system: S1 & S2 heard, RRR.  Gastrointestinal system: Abdomen is obese, soft and nontender. Central nervous system: Awake and alert.     Data Reviewed: I have personally reviewed following labs and imaging studies  CBC: Recent Labs  Lab 08/03/24 0920 08/04/24 0420 08/05/24 0120  WBC  8.8 7.4 8.1  NEUTROABS 7.8*  --   --   HGB 6.8* 7.3* 7.1*  HCT 22.7* 23.5* 22.9*  MCV 90.1 87.7 89.1  PLT 276 240 243   Basic Metabolic Panel: Recent Labs  Lab 08/03/24 0920 08/04/24 0420 08/05/24 0120  NA 136 136 134*  K 4.3 4.0 4.0  CL 99 104 102  CO2 24 21* 21*  GLUCOSE 148* 125* 130*  BUN 53* 50* 47*  CREATININE 2.20* 1.96* 1.97*  CALCIUM  8.7* 7.8* 7.8*  MG  --   --  2.0  PHOS  --   --  3.3     Recent Results (from the past 240 hours)  Resp panel by RT-PCR (RSV, Flu A&B, Covid) Anterior Nasal Swab     Status: None   Collection Time: 08/03/24  9:21 AM   Specimen: Anterior Nasal Swab  Result Value Ref Range Status   SARS Coronavirus 2 by RT PCR NEGATIVE NEGATIVE Final    Comment: (NOTE) SARS-CoV-2 target nucleic acids are NOT DETECTED.  The SARS-CoV-2 RNA is generally detectable in upper respiratory specimens during the acute phase of infection. The lowest concentration of SARS-CoV-2 viral copies this assay can detect is 138 copies/mL. A negative result does not preclude  SARS-Cov-2 infection and should not be used as the sole basis for treatment or other patient management decisions. A negative result may occur with  improper specimen collection/handling, submission of specimen other than nasopharyngeal swab, presence of viral mutation(s) within the areas targeted by this assay, and inadequate number of viral copies(<138 copies/mL). A negative result must be combined with clinical observations, patient history, and epidemiological information. The expected result is Negative.  Fact Sheet for Patients:  bloggercourse.com  Fact Sheet for Healthcare Providers:  seriousbroker.it  This test is no t yet approved or cleared by the [place]  FDA and  has been authorized for detection and/or diagnosis of SARS-CoV-2 by FDA under an Emergency Use Authorization (EUA). This EUA will remain  in effect (meaning this test can be used) for the duration of the COVID-19 declaration under Section 564(b)(1) of the Act, 21 U.S.C.section 360bbb-3(b)(1), unless the authorization is terminated  or revoked sooner.       Influenza A by PCR NEGATIVE NEGATIVE Final   Influenza B by PCR NEGATIVE NEGATIVE Final    Comment: (NOTE) The Xpert Xpress SARS-CoV-2/FLU/RSV plus assay is intended as an aid in the diagnosis of influenza from Nasopharyngeal swab specimens and should not be used as a sole basis for treatment. Nasal washings and aspirates are unacceptable for Xpert Xpress SARS-CoV-2/FLU/RSV testing.  Fact Sheet for Patients: bloggercourse.com  Fact Sheet for Healthcare Providers: seriousbroker.it  This test is not yet approved or cleared by the [place]  FDA and has been authorized for detection and/or diagnosis of SARS-CoV-2 by FDA under an Emergency Use Authorization (EUA). This EUA will remain in effect (meaning this test can be used) for the duration of  the COVID-19 declaration under Section 564(b)(1) of the Act, 21 U.S.C. section 360bbb-3(b)(1), unless the authorization is terminated or revoked.     Resp Syncytial Virus by PCR NEGATIVE NEGATIVE Final    Comment: (NOTE) Fact Sheet for Patients: bloggercourse.com  Fact Sheet for Healthcare Providers: seriousbroker.it  This test is not yet approved or cleared by the [place]  FDA and has been authorized for detection and/or diagnosis of SARS-CoV-2 by FDA under an Emergency Use Authorization (EUA). This EUA will remain in effect (meaning this test can be used) for the  duration of the COVID-19 declaration under Section 564(b)(1) of the Act, 21 U.S.C. section 360bbb-3(b)(1), unless the authorization is terminated or revoked.  Performed at [facility], [address] [place], [place] [address]   Urine Culture (for pregnant, neutropenic or urologic patients or patients with an indwelling urinary catheter)     Status: Abnormal   Collection Time: 08/03/24 12:28 PM   Specimen: Urine, Clean Catch  Result Value Ref Range Status   Specimen Description   Final    URINE, CLEAN CATCH Performed at [facility], [address], [place], [place] [address]    Special Requests   Final    NONE Performed at [facility], [address] [place], [place] [address]    Culture (A)  Final    30,000 COLONIES/mL PSEUDOMONAS AERUGINOSA WITHIN MIXED ORGANISMS Performed at [facility], [address], [place], [place] [address]    Report Status 08/05/2024 FINAL  Final   Organism ID, Bacteria PSEUDOMONAS AERUGINOSA (A)  Final      Susceptibility   Pseudomonas aeruginosa - MIC*    MEROPENEM  <=0.25 SENSITIVE Sensitive     CIPROFLOXACIN  >=4 RESISTANT Resistant     IMIPENEM 2 SENSITIVE Sensitive     PIP/TAZO Value in next row Sensitive      16 SENSITIVEThis is a modified FDA-approved  test that has been validated and its performance characteristics determined by the reporting laboratory.  This laboratory is certified under the Clinical Laboratory Improvement Amendments CLIA as qualified to perform high complexity clinical laboratory testing.    CEFTAZIDIME /AVIBACTAM Value in next row Sensitive      16 SENSITIVEThis is a modified FDA-approved test that has been validated and its performance characteristics determined by the reporting laboratory.  This laboratory is certified under the Clinical Laboratory Improvement Amendments CLIA as qualified to perform high complexity clinical laboratory testing.    CEFTOLOZANE/TAZOBACTAM Value in next row Sensitive      16 SENSITIVEThis is a modified FDA-approved test that has been validated and its performance characteristics determined by the reporting laboratory.  This laboratory is certified under the Clinical Laboratory Improvement Amendments CLIA as qualified to perform high complexity clinical laboratory testing.    TOBRAMYCIN Value in next row Sensitive      16 SENSITIVEThis is a modified FDA-approved test that has been validated and its performance characteristics determined by the reporting laboratory.  This laboratory is certified under the Clinical Laboratory Improvement Amendments CLIA as qualified to perform high complexity clinical laboratory testing.    CEFTAZIDIME  Value in next row Sensitive      16 SENSITIVEThis is a modified FDA-approved test that has been validated and its performance characteristics determined by the reporting laboratory.  This laboratory is certified under the Clinical Laboratory Improvement Amendments CLIA as qualified to perform high complexity clinical laboratory testing.    * 30,000 COLONIES/mL PSEUDOMONAS AERUGINOSA     Radiology Studies: CT ABDOMEN PELVIS WO CONTRAST Result Date: 08/04/2024 CLINICAL DATA:  Left lower quadrant pain EXAM: CT ABDOMEN AND PELVIS WITHOUT CONTRAST TECHNIQUE: Multidetector  CT imaging of the abdomen and pelvis was performed following the standard protocol without IV contrast. RADIATION DOSE REDUCTION: This exam was performed according to the departmental dose-optimization program which includes automated exposure control, adjustment of the mA and/or kV according to patient size and/or use of iterative reconstruction technique. COMPARISON:  CT 06/06/2024, 10/29/2023, 01/30/2024 FINDINGS: Lower chest: Advanced aortic atherosclerosis. Coronary vascular calcification. Cardiomegaly. Moderate to large hiatal hernia.  Partially imaged slightly loculated appearing small moderate left pleural effusion. Mild subpleural atelectasis or minimal aspiration in the posterior right lung base. Hepatobiliary: No focal liver abnormality is seen. No gallstones, gallbladder wall thickening, or biliary dilatation. Pancreas: Unremarkable. No pancreatic ductal dilatation or surrounding inflammatory changes. Spleen: Normal in size without focal abnormality. Adrenals/Urinary Tract: Adrenal glands are normal. Mild cortical atrophy of the left kidney. Multiple cysts and cortical hyperdensities, limited characterization without contrast but better characterized on prior MRI from 2024, no specific imaging follow-up recommended. 10 mm stone within the mid left kidney with additional smaller left kidney stones. Interim development of moderate severe left hydronephrosis and hydroureter, ureter dilated to the level of the pelvis, the distal ureter proximal to the bladder is decompressed. Bladder is unremarkable. Stomach/Bowel: Stomach is nondistended. No dilated small bowel. Diffuse diverticular disease of the colon. Focal wall thickening and mild inflammatory change at the sigmoid colon. New gas and fluid collection along the left pelvic sidewall, estimated measurement of 5.9 x 2.3 cm on series 2, image 63. Further assessment limited without contrast. Smaller gas and fluid collection in the left inferior pelvis, series  2, image 68. Vascular/Lymphatic: Aortic atherosclerosis. No enlarged abdominal or pelvic lymph nodes. Reproductive: Negative for adnexal mass Other: No ascites.  Small fat containing umbilical hernia Musculoskeletal: Scoliosis and degenerative changes. No acute osseous abnormality. IMPRESSION: 1. Diffuse diverticular disease of the colon. Focal wall thickening and mild inflammatory change at the sigmoid colon, probably representing diverticulitis with colon mass not excluded. New gas and fluid collection along the left pelvic sidewall measuring up to 5.9 cm, suspicious for focal/contained perforation or abscess, assessment limited in the absence of intravenous contrast. Smaller gas and fluid collection in the left inferior pelvis, this is indeterminate for an additional gas and fluid collection versus fluid containing inflamed large diverticulum. 2. Interim development of moderate to severe left hydronephrosis and hydroureter, ureter dilated to the level of the pelvis/area of inflammatory change at sigmoid colon. Suspect obstruction of the distal left ureter by the colon inflammatory changes and gas and fluid collection. 3. Partially imaged slightly loculated appearing small to moderate left pleural effusion. Mild subpleural atelectasis or minimal aspiration in the posterior right lung base. 4. Moderate to large hiatal hernia. 5. Aortic atherosclerosis. These results will be called to the ordering clinician or representative by the Radiologist Assistant, and communication documented in the PACS or [employer]. Aortic Atherosclerosis (ICD10-I70.0). Electronically Signed   By: Luke Bun M.D.   On: 08/04/2024 22:20    Scheduled Meds:  atorvastatin   80 mg Oral Daily   budesonide -glycopyrrolate -formoterol   2 puff Inhalation Daily   Chlorhexidine  Gluconate Cloth  6 each Topical Daily   ferrous sulfate   325 mg Oral Q breakfast   levothyroxine   175 mcg Oral Once per day on Monday Tuesday Wednesday Thursday    pantoprazole   40 mg Oral Daily   Ensure Max Protein  11 oz Oral BID BM   Continuous Infusions:  cefTRIAXone  (ROCEPHIN )  IV 2 g (08/05/24 1424)   metronidazole  500 mg (08/05/24 1139)     LOS: 2 days   Leatrice LILLETTE Chapel, MD [facility] P12/07/2024, 5:15 PMINR 10,  iron  panel reveals iron  of 10, iron  panel reveals

## 2024-08-06 ENCOUNTER — Inpatient Hospital Stay (HOSPITAL_COMMUNITY)

## 2024-08-06 LAB — RENAL FUNCTION PANEL
Albumin: 2.4 g/dL — ABNORMAL LOW (ref 3.5–5.0)
Anion gap: 10 (ref 5–15)
BUN: 50 mg/dL — ABNORMAL HIGH (ref 8–23)
CO2: 21 mmol/L — ABNORMAL LOW (ref 22–32)
Calcium: 7.9 mg/dL — ABNORMAL LOW (ref 8.9–10.3)
Chloride: 103 mmol/L (ref 98–111)
Creatinine, Ser: 2 mg/dL — ABNORMAL HIGH (ref 0.44–1.00)
GFR, Estimated: 24 mL/min — ABNORMAL LOW (ref >60)
Glucose, Bld: 120 mg/dL — ABNORMAL HIGH (ref 70–99)
Phosphorus: 3.2 mg/dL (ref 2.5–4.6)
Potassium: 4.3 mmol/L (ref 3.5–5.1)
Sodium: 133 mmol/L — ABNORMAL LOW (ref 135–145)

## 2024-08-06 LAB — CBC WITH DIFFERENTIAL/PLATELET
Abs Immature Granulocytes: 0.15 K/uL — ABNORMAL HIGH (ref 0.00–0.07)
Basophils Absolute: 0 K/uL (ref 0.0–0.1)
Basophils Relative: 0 %
Eosinophils Absolute: 0.1 K/uL (ref 0.0–0.5)
Eosinophils Relative: 1 %
HCT: 22 % — ABNORMAL LOW (ref 36.0–46.0)
Hemoglobin: 6.9 g/dL — CL (ref 12.0–15.0)
Immature Granulocytes: 2 %
Lymphocytes Relative: 3 %
Lymphs Abs: 0.2 K/uL — ABNORMAL LOW (ref 0.7–4.0)
MCH: 27.9 pg (ref 26.0–34.0)
MCHC: 31.4 g/dL (ref 30.0–36.0)
MCV: 89.1 fL (ref 80.0–100.0)
Monocytes Absolute: 0.5 K/uL (ref 0.1–1.0)
Monocytes Relative: 6 %
Neutro Abs: 6.7 K/uL (ref 1.7–7.7)
Neutrophils Relative %: 88 %
Platelets: 232 K/uL (ref 150–400)
RBC: 2.47 MIL/uL — ABNORMAL LOW (ref 3.87–5.11)
RDW: 17 % — ABNORMAL HIGH (ref 11.5–15.5)
WBC: 7.6 K/uL (ref 4.0–10.5)
nRBC: 0 % (ref 0.0–0.2)

## 2024-08-06 LAB — GLUCOSE, CAPILLARY
Glucose-Capillary: 105 mg/dL — ABNORMAL HIGH (ref 70–99)
Glucose-Capillary: 128 mg/dL — ABNORMAL HIGH (ref 70–99)
Glucose-Capillary: 131 mg/dL — ABNORMAL HIGH (ref 70–99)
Glucose-Capillary: 159 mg/dL — ABNORMAL HIGH (ref 70–99)
Glucose-Capillary: 169 mg/dL — ABNORMAL HIGH (ref 70–99)
Glucose-Capillary: 174 mg/dL — ABNORMAL HIGH (ref 70–99)

## 2024-08-06 LAB — MAGNESIUM: Magnesium: 1.9 mg/dL (ref 1.7–2.4)

## 2024-08-06 LAB — HEMOGLOBIN AND HEMATOCRIT, BLOOD
HCT: 26.5 % — ABNORMAL LOW (ref 36.0–46.0)
Hemoglobin: 8.2 g/dL — ABNORMAL LOW (ref 12.0–15.0)

## 2024-08-06 LAB — PREPARE RBC (CROSSMATCH)

## 2024-08-06 MED ORDER — POLYETHYLENE GLYCOL 3350 17 G PO PACK
17.0000 g | PACK | Freq: Every day | ORAL | Status: DC
  Administered 2024-08-06 – 2024-08-09 (×2): 17 g via ORAL
  Filled 2024-08-06 (×3): qty 1

## 2024-08-06 MED ORDER — SODIUM CHLORIDE 0.9% IV SOLUTION: Freq: Once | INTRAVENOUS | Status: AC

## 2024-08-06 MED ORDER — ORAL CARE MOUTH RINSE: 15.0000 mL | OROMUCOSAL | Status: DC | PRN

## 2024-08-06 MED ORDER — SODIUM CHLORIDE 0.9 % IV SOLN
1.0000 g | Freq: Two times a day (BID) | INTRAVENOUS | Status: DC
  Administered 2024-08-06 – 2024-08-10 (×9): 1 g via INTRAVENOUS
  Filled 2024-08-06 (×10): qty 20

## 2024-08-06 NOTE — Progress Notes (Signed)
 Subjective/Chief Complaint: Complains of a tummy ache.  A bit worse after clears. Denies n/v.  Denies bloating.     Objective: Vital signs in last 24 hours: Temp:  [98.7 F (37.1 C)-99.3 F (37.4 C)] 98.9 F (37.2 C) (12/13 0416) Pulse Rate:  [73-81] 81 (12/13 0416) Resp:  [18-24] 24 (12/13 0416) BP: (113-122)/(40-95) 117/40 (12/13 0416) SpO2:  [92 %-98 %] 98 % (12/13 0416) Last BM Date : 08/03/24  Intake/Output from previous day: 12/12 0701 - 12/13 0700 In: 1139.9 [I.V.:739.9; IV Piggyback:400] Out: 500 [Urine:500] Intake/Output this shift: No intake/output data recorded.   General:  alert and oriented.  Looks mildly uncomfortable Resp:  breathing comfortably.  Abd:  sl protuberant. No appreciable localized tenderness  Lab Results:  Recent Labs    08/05/24 0120 08/06/24 0414  WBC 8.1 7.6  HGB 7.1* 6.9*  HCT 22.9* 22.0*  PLT 243 232   BMET Recent Labs    08/05/24 0120 08/06/24 0414  NA 134* 133*  K 4.0 4.3  CL 102 103  CO2 21* 21*  GLUCOSE 130* 120*  BUN 47* 50*  CREATININE 1.97* 2.00*  CALCIUM  7.8* 7.9*   PT/INR No results for input(s): LABPROT, INR in the last 72 hours. ABG No results for input(s): PHART, HCO3 in the last 72 hours.  Invalid input(s): PCO2, PO2  Studies/Results: CT ABDOMEN PELVIS WO CONTRAST Result Date: 08/04/2024 CLINICAL DATA:  Left lower quadrant pain EXAM: CT ABDOMEN AND PELVIS WITHOUT CONTRAST TECHNIQUE: Multidetector CT imaging of the abdomen and pelvis was performed following the standard protocol without IV contrast. RADIATION DOSE REDUCTION: This exam was performed according to the departmental dose-optimization program which includes automated exposure control, adjustment of the mA and/or kV according to patient size and/or use of iterative reconstruction technique. COMPARISON:  CT 06/06/2024, 10/29/2023, 01/30/2024 FINDINGS: Lower chest: Advanced aortic atherosclerosis. Coronary vascular calcification.  Cardiomegaly. Moderate to large hiatal hernia. Partially imaged slightly loculated appearing small moderate left pleural effusion. Mild subpleural atelectasis or minimal aspiration in the posterior right lung base. Hepatobiliary: No focal liver abnormality is seen. No gallstones, gallbladder wall thickening, or biliary dilatation. Pancreas: Unremarkable. No pancreatic ductal dilatation or surrounding inflammatory changes. Spleen: Normal in size without focal abnormality. Adrenals/Urinary Tract: Adrenal glands are normal. Mild cortical atrophy of the left kidney. Multiple cysts and cortical hyperdensities, limited characterization without contrast but better characterized on prior MRI from 2024, no specific imaging follow-up recommended. 10 mm stone within the mid left kidney with additional smaller left kidney stones. Interim development of moderate severe left hydronephrosis and hydroureter, ureter dilated to the level of the pelvis, the distal ureter proximal to the bladder is decompressed. Bladder is unremarkable. Stomach/Bowel: Stomach is nondistended. No dilated small bowel. Diffuse diverticular disease of the colon. Focal wall thickening and mild inflammatory change at the sigmoid colon. New gas and fluid collection along the left pelvic sidewall, estimated measurement of 5.9 x 2.3 cm on series 2, image 63. Further assessment limited without contrast. Smaller gas and fluid collection in the left inferior pelvis, series 2, image 68. Vascular/Lymphatic: Aortic atherosclerosis. No enlarged abdominal or pelvic lymph nodes. Reproductive: Negative for adnexal mass Other: No ascites.  Small fat containing umbilical hernia Musculoskeletal: Scoliosis and degenerative changes. No acute osseous abnormality. IMPRESSION: 1. Diffuse diverticular disease of the colon. Focal wall thickening and mild inflammatory change at the sigmoid colon, probably representing diverticulitis with colon mass not excluded. New gas and fluid  collection along the left pelvic sidewall measuring up to  5.9 cm, suspicious for focal/contained perforation or abscess, assessment limited in the absence of intravenous contrast. Smaller gas and fluid collection in the left inferior pelvis, this is indeterminate for an additional gas and fluid collection versus fluid containing inflamed large diverticulum. 2. Interim development of moderate to severe left hydronephrosis and hydroureter, ureter dilated to the level of the pelvis/area of inflammatory change at sigmoid colon. Suspect obstruction of the distal left ureter by the colon inflammatory changes and gas and fluid collection. 3. Partially imaged slightly loculated appearing small to moderate left pleural effusion. Mild subpleural atelectasis or minimal aspiration in the posterior right lung base. 4. Moderate to large hiatal hernia. 5. Aortic atherosclerosis. These results will be called to the ordering clinician or representative by the Radiologist Assistant, and communication documented in the PACS or [employer]. Aortic Atherosclerosis (ICD10-I70.0). Electronically Signed   By: Luke Bun M.D.   On: 08/04/2024 22:20    Anti-infectives: Anti-infectives (From admission, onward)    Start     Dose/Rate Route Frequency Ordered Stop   08/06/24 0415  meropenem  (MERREM ) 1 g in sodium chloride  0.9 % 100 mL IVPB        1 g 200 mL/hr over 30 Minutes Intravenous Every 12 hours 08/06/24 0317     08/04/24 2345  metroNIDAZOLE  (FLAGYL ) IVPB 500 mg        500 mg 100 mL/hr over 60 Minutes Intravenous Every 12 hours 08/04/24 2256     08/04/24 1300  cefTRIAXone  (ROCEPHIN ) 2 g in sodium chloride  0.9 % 100 mL IVPB  Status:  Discontinued        2 g 200 mL/hr over 30 Minutes Intravenous Every 24 hours 08/03/24 1220 08/03/24 1236   08/03/24 1300  cefTRIAXone  (ROCEPHIN ) 2 g in sodium chloride  0.9 % 100 mL IVPB  Status:  Discontinued        2 g 200 mL/hr over 30 Minutes Intravenous Every 24 hours 08/03/24  1236 08/06/24 0317   08/03/24 1200  cefTRIAXone  (ROCEPHIN ) 1 g in sodium chloride  0.9 % 100 mL IVPB  Status:  Discontinued        1 g 200 mL/hr over 30 Minutes Intravenous Every 24 hours 08/03/24 1154 08/03/24 1220   08/03/24 1145  nitrofurantoin  (MACRODANTIN ) capsule 100 mg  Status:  Discontinued        100 mg Oral Daily 08/03/24 1143 08/03/24 1154       Assessment/Plan:  Presumed sigmoid diverticulitis with abscess Possibility of mass in this region.  No recent colonoscopy and dx of metastatic lung cancer.  No overt signs of mass, but unable to exclude.    Slight increase in symptoms with clear liquids, but pt reports that this is minimal.  Discussed taking it easy with less intake.    Given benign exam would not make NPO at this point.  Discussed trajectory of this and advised that we don't have a defined time point as to how long she'll be in the hospital.    Anemia of chronic dx and possible chronic blood loss anemia- getting transfusion today.    Continue non operative management with IV antibiotics and no advancement of diet at this point.      LOS: 3 days    Jina LITTIE Nephew, MD, FACS, FSSO Surgical Oncology, General Surgery, Trauma and Critical [facility], [place] [phone] for weekday/non holidays Check [email or web address] for coverage night/weekend/holidays under General Surgery

## 2024-08-06 NOTE — Plan of Care (Signed)

## 2024-08-06 NOTE — Progress Notes (Signed)
 PROGRESS NOTE  Abigail [name]  FMW:[id number] DOB: 1935/09/06 DOA: 08/03/2024 PCP: [name], FNP   Brief Narrative:  Abigail [name] is a 88 y.o. female with medical history significant of metastatic non-small lung cancer to brain, currently on radiation treatment, chronic hypoxic respiratory failure on 3 L of oxygen  per minute, CKD stage IIIb, 2 skin cancers on her legs s/p resection, hypertension, coronary artery disease, COPD on 3 L of oxygen  per minute, chronic normocytic anemia, hypothyroidism who presented from home with complaint of generalized weakness.  Labs showed creatinine of 2.2, hemoglobin of 6.7.  Started on IV fluids and given a unit of PRBC transfusion.  PT/OT consulted.  Oncology also consulted.  08/05/2024: Patient seen.  Patient continues to report weakness.  Patient Particularly great historian.  08/06/2024: Patient seen.  Patient looks better today.  Patient is more communicative.  Patient was seen alongside patient's cousin.  Called patient's daughter to update her, however, call went to voicemail.  Voicemail was left for patient's daughter.  Continue meropenem  and Flagyl  day.  Renal ultrasound (left hydronephrosis/hydroureter).  Per patient's request, will likely consult Dr. Gatha on Monday, 08/08/2024.  Assessment & Plan:  Principal Problem:   AKI (acute kidney injury) Active Problems:   Hyperlipidemia   Hypothyroidism   CKD (chronic kidney disease), stage III (HCC)   Essential hypertension   Coronary artery disease of native artery of native heart with stable angina pectoris   Malignant neoplasm of unspecified part of unspecified bronchus or lung (HCC)   COPD (chronic obstructive pulmonary disease) (HCC)   Symptomatic anemia   Chronic iron  deficiency anemia   Weakness   Urinary tract infection   Non-small cell lung cancer (HCC)   AKI on CKD stage IIIb: - Baseline creatinine around 1.6. - Admitted with serum creatinine of 2.2 -Poor oral intake prior to  presentation. - Continue IV fluid. - Serum creatinine of 2 today.   -Renal ultrasound revealed severe left-sided hydronephrosis. - Will consult urology team. - Continue to monitor renal function and electrolytes.   Metastatic non-small cell lung cancer with solitary cerebral brain metastasis: -Follows with Dr. Sherrod.  Completed concurrent chemoradiation and 1 year of Durvalumab  consolidation therapy.  Currently on stereotactic radiosurgery for brain mets.  She completed the course of Decadron .  Follows with radiation oncology.   oncology/Dr. Sherrod consulted   Chronic hypoxic respiratory failure/COPD:  -Currently not on exacerbation.  On 3 L of oxygen  chronically at home.  Continue bronchodilators, inhalers.   -Not in acute exacerbation at the moment.   Acute on chronic normocytic anemia:  -Baseline hemoglobin around 7-8. - Admitted with hemoglobin of 6.8 g/dL.  Likely associated with malignancy.  No evidence of acute blood loss.   -Given a unit of PRBC.   - Hemoglobin of 7.1 g/dL today. - Continue to monitor closely.  -Iron  panel reveals iron  of 10, TSAT of 5, TIBC of 227. -Likely anemia of chronic inflammation.  Patient follows up with oncology team. -Continue ferrous sulfate .   Hypertension: - Blood pressure is controlled.   -Takes metoprolol  at home.  Currently on hold.     History of coronary artery disease/elevated troponin:  -On aspirin , Lipitor , Repatha .  No anginal symptoms.  Takes aspirin  at home, currently on hold, will resume on discharge if no evidence of GI bleed. Denies any chest pain today.  Elevated troponin is likely from demand ischemia from anemia.   History of skin cancer:  -Underwent resection of the skin tumor on the left leg  on October of this year.  Wound care consulted.  Wound appears to be slightly infected with purulent material.  Continue ceftriaxone    History of recurrent UTI:  -On chronic therapy with nitrofurantoin . She has  noted frequency,  dysuria symptoms.  Also complained of left lower quadrant pain.  Checking CT abdomen/pelvis without contrast.  Started on ceftriaxone .  Urine culture showing 30,000 colonies of Pseudomonas along with mixed organisms. 08/05/2024: Will likely discontinue Rocephin .  Consult pharmacy for meropenem .     Debility/deconditioning/generalized weakness:  -Patient lives with son.But on son is at at work all time time.  Will consult PT.  Ambulates with rollator.  Son is agreeable for SNF if needed 12/25: Further PT/OT recommendation  Diffuse diverticular disease of the colon/diverticulitis of sigmoid colon with colon mass not excluded/suspicious focal/contained perforation and abscess: - Continue antibiotics (meropenem  and Flagyl ).   Goals of care: Elderly patient with multiple comorbidities including metastatic lung cancer.  We consulted palliative care for goals of care.  I have a long discussion with her son [name] on phone today.  He understands her poor quality of life.  He is interested to discuss goals of care.       DVT prophylaxis:SCDs Start: 08/03/24 1141     Code Status: Full Code  Family Communication:   Patient status: Inpatient  Patient is from : Home  Anticipated discharge to: Home versus SNF  Estimated DC date: 2-3 days   Consultants: Oncology  Procedures: None  Antimicrobials:  Anti-infectives (From admission, onward)    Start     Dose/Rate Route Frequency Ordered Stop   08/06/24 0415  meropenem  (MERREM ) 1 g in sodium chloride  0.9 % 100 mL IVPB        1 g 200 mL/hr over 30 Minutes Intravenous Every 12 hours 08/06/24 0317     08/04/24 2345  metroNIDAZOLE  (FLAGYL ) IVPB 500 mg        500 mg 100 mL/hr over 60 Minutes Intravenous Every 12 hours 08/04/24 2256     08/04/24 1300  cefTRIAXone  (ROCEPHIN ) 2 g in sodium chloride  0.9 % 100 mL IVPB  Status:  Discontinued        2 g 200 mL/hr over 30 Minutes Intravenous Every 24 hours 08/03/24 1220 08/03/24 1236   08/03/24 1300   cefTRIAXone  (ROCEPHIN ) 2 g in sodium chloride  0.9 % 100 mL IVPB  Status:  Discontinued        2 g 200 mL/hr over 30 Minutes Intravenous Every 24 hours 08/03/24 1236 08/06/24 0317   08/03/24 1200  cefTRIAXone  (ROCEPHIN ) 1 g in sodium chloride  0.9 % 100 mL IVPB  Status:  Discontinued        1 g 200 mL/hr over 30 Minutes Intravenous Every 24 hours 08/03/24 1154 08/03/24 1220   08/03/24 1145  nitrofurantoin  (MACRODANTIN ) capsule 100 mg  Status:  Discontinued        100 mg Oral Daily 08/03/24 1143 08/03/24 1154       Subjective: Patient seen and examined Patient continues to report feeling weak.    Objective: Vitals:   08/06/24 1054 08/06/24 1109 08/06/24 1315 08/06/24 1333  BP: (!) 103/42 (!) 100/42 (!) 105/58 (!) 105/43  Pulse: 84 82 79 78  Resp: 16 16 16  (!) 21  Temp: 98.2 F (36.8 C) 98.3 F (36.8 C) 98.6 F (37 C) 97.8 F (36.6 C)  TempSrc: Oral Oral Oral Oral  SpO2: 95% 99% 97% 96%  Weight:      Height:  Intake/Output Summary (Last 24 hours) at 08/06/2024 1956 Last data filed at 08/06/2024 1625 Gross per 24 hour  Intake 1224.5 ml  Output 250 ml  Net 974.5 ml   Filed Weights   08/03/24 0908 08/03/24 1359  Weight: 70.8 kg 71.5 kg    Examination:  General exam: Chronically ill looking.   Respiratory system: Clear to auscultation. Cardiovascular system: S1 & S2 heard, RRR.  Gastrointestinal system: Abdomen is obese, soft and nontender. Central nervous system: Awake and alert.     Data Reviewed: I have personally reviewed following labs and imaging studies  CBC: Recent Labs  Lab 08/03/24 0920 08/04/24 0420 08/05/24 0120 08/06/24 0414 08/06/24 1907  WBC 8.8 7.4 8.1 7.6  --   NEUTROABS 7.8*  --   --  6.7  --   HGB 6.8* 7.3* 7.1* 6.9* 8.2*  HCT 22.7* 23.5* 22.9* 22.0* 26.5*  MCV 90.1 87.7 89.1 89.1  --   PLT 276 240 243 232  --    Basic Metabolic Panel: Recent Labs  Lab 08/03/24 0920 08/04/24 0420 08/05/24 0120 08/06/24 0414  NA 136 136  134* 133*  K 4.3 4.0 4.0 4.3  CL 99 104 102 103  CO2 24 21* 21* 21*  GLUCOSE 148* 125* 130* 120*  BUN 53* 50* 47* 50*  CREATININE 2.20* 1.96* 1.97* 2.00*  CALCIUM  8.7* 7.8* 7.8* 7.9*  MG  --   --  2.0 1.9  PHOS  --   --  3.3 3.2     Recent Results (from the past 240 hours)  Resp panel by RT-PCR (RSV, Flu A&B, Covid) Anterior Nasal Swab     Status: None   Collection Time: 08/03/24  9:21 AM   Specimen: Anterior Nasal Swab  Result Value Ref Range Status   SARS Coronavirus 2 by RT PCR NEGATIVE NEGATIVE Final    Comment: (NOTE) SARS-CoV-2 target nucleic acids are NOT DETECTED.  The SARS-CoV-2 RNA is generally detectable in upper respiratory specimens during the acute phase of infection. The lowest concentration of SARS-CoV-2 viral copies this assay can detect is 138 copies/mL. A negative result does not preclude SARS-Cov-2 infection and should not be used as the sole basis for treatment or other patient management decisions. A negative result may occur with  improper specimen collection/handling, submission of specimen other than nasopharyngeal swab, presence of viral mutation(s) within the areas targeted by this assay, and inadequate number of viral copies(<138 copies/mL). A negative result must be combined with clinical observations, patient history, and epidemiological information. The expected result is Negative.  Fact Sheet for Patients:  bloggercourse.com  Fact Sheet for Healthcare Providers:  seriousbroker.it  This test is no t yet approved or cleared by the [place]  FDA and  has been authorized for detection and/or diagnosis of SARS-CoV-2 by FDA under an Emergency Use Authorization (EUA). This EUA will remain  in effect (meaning this test can be used) for the duration of the COVID-19 declaration under Section 564(b)(1) of the Act, 21 U.S.C.section 360bbb-3(b)(1), unless the authorization is terminated  or revoked  sooner.       Influenza A by PCR NEGATIVE NEGATIVE Final   Influenza B by PCR NEGATIVE NEGATIVE Final    Comment: (NOTE) The Xpert Xpress SARS-CoV-2/FLU/RSV plus assay is intended as an aid in the diagnosis of influenza from Nasopharyngeal swab specimens and should not be used as a sole basis for treatment. Nasal washings and aspirates are unacceptable for Xpert Xpress SARS-CoV-2/FLU/RSV testing.  Fact Sheet for Patients: bloggercourse.com  Fact Sheet for Healthcare Providers: seriousbroker.it  This test is not yet approved or cleared by the [place]  FDA and has been authorized for detection and/or diagnosis of SARS-CoV-2 by FDA under an Emergency Use Authorization (EUA). This EUA will remain in effect (meaning this test can be used) for the duration of the COVID-19 declaration under Section 564(b)(1) of the Act, 21 U.S.C. section 360bbb-3(b)(1), unless the authorization is terminated or revoked.     Resp Syncytial Virus by PCR NEGATIVE NEGATIVE Final    Comment: (NOTE) Fact Sheet for Patients: bloggercourse.com  Fact Sheet for Healthcare Providers: seriousbroker.it  This test is not yet approved or cleared by the [place]  FDA and has been authorized for detection and/or diagnosis of SARS-CoV-2 by FDA under an Emergency Use Authorization (EUA). This EUA will remain in effect (meaning this test can be used) for the duration of the COVID-19 declaration under Section 564(b)(1) of the Act, 21 U.S.C. section 360bbb-3(b)(1), unless the authorization is terminated or revoked.  Performed at [facility], [address] [place], [place] [address]   Urine Culture (for pregnant, neutropenic or urologic patients or patients with an indwelling urinary catheter)     Status: Abnormal   Collection Time: 08/03/24 12:28 PM   Specimen: Urine, Clean Catch   Result Value Ref Range Status   Specimen Description   Final    URINE, CLEAN CATCH Performed at [facility], [address], [place], [place] [address]    Special Requests   Final    NONE Performed at [facility], [address] [place], [place] [address]    Culture (A)  Final    30,000 COLONIES/mL PSEUDOMONAS AERUGINOSA WITHIN MIXED ORGANISMS Performed at [facility], [address] [place], [place] [address]    Report Status 08/05/2024 FINAL  Final   Organism ID, Bacteria PSEUDOMONAS AERUGINOSA (A)  Final      Susceptibility   Pseudomonas aeruginosa - MIC*    MEROPENEM  <=0.25 SENSITIVE Sensitive     CIPROFLOXACIN  >=4 RESISTANT Resistant     IMIPENEM 2 SENSITIVE Sensitive     PIP/TAZO Value in next row Sensitive      16 SENSITIVEThis is a modified FDA-approved test that has been validated and its performance characteristics determined by the reporting laboratory.  This laboratory is certified under the Clinical Laboratory Improvement Amendments CLIA as qualified to perform high complexity clinical laboratory testing.    CEFTAZIDIME /AVIBACTAM Value in next row Sensitive      16 SENSITIVEThis is a modified FDA-approved test that has been validated and its performance characteristics determined by the reporting laboratory.  This laboratory is certified under the Clinical Laboratory Improvement Amendments CLIA as qualified to perform high complexity clinical laboratory testing.    CEFTOLOZANE/TAZOBACTAM Value in next row Sensitive      16 SENSITIVEThis is a modified FDA-approved test that has been validated and its performance characteristics determined by the reporting laboratory.  This laboratory is certified under the Clinical Laboratory Improvement Amendments CLIA as qualified to perform high complexity clinical laboratory testing.    TOBRAMYCIN Value in next row Sensitive      16 SENSITIVEThis is a modified FDA-approved test that has  been validated and its performance characteristics determined by the reporting laboratory.  This laboratory is certified under the Clinical Laboratory Improvement Amendments CLIA as qualified to perform high complexity clinical laboratory testing.    CEFTAZIDIME  Value in next row Sensitive      16  SENSITIVEThis is a modified FDA-approved test that has been validated and its performance characteristics determined by the reporting laboratory.  This laboratory is certified under the Clinical Laboratory Improvement Amendments CLIA as qualified to perform high complexity clinical laboratory testing.    * 30,000 COLONIES/mL PSEUDOMONAS AERUGINOSA     Radiology Studies: DG Abd Portable 1V Result Date: 08/06/2024 CLINICAL DATA:  [id number] Nausea AND vomiting [id number] EXAM: PORTABLE ABDOMEN - 1 VIEW COMPARISON:  August 04, 2024 FINDINGS: Air and stool filled nondilated loops of bowel. Enteric contrast delineates predominantly bowel loops within the RIGHT hemicolon and transverse colon. Air is visualized in the rectum. Degenerative changes of the lumbar spine. IMPRESSION: Nonobstructive bowel gas pattern. Known colonic perforation is not visualized radiographically. Electronically Signed   By: Corean Salter M.D.   On: 08/06/2024 19:15   US  RENAL Result Date: 08/06/2024 CLINICAL DATA:  [id number] Hydronephrosis of left kidney [id number] EXAM: RENAL / URINARY TRACT ULTRASOUND COMPLETE COMPARISON:  August 04, 2024 FINDINGS: Right Kidney: Renal measurements: 10.1 x 4.6 x 4.8 cm = volume: 114 mL. Echogenicity is mildly increased. No hydronephrosis visualized. Benign cyst is noted measuring 14 mm (for which no dedicated imaging follow-up is recommended). Left Kidney: Renal measurements: 11.7 x 5.7 x 4.7 cm = volume: 163 mL. Echogenicity is increased. There is severe hydronephrosis. Bladder: Not visualized secondary to shadowing bowel gas Other: None. IMPRESSION: 1. Severe left hydronephrosis. 2. Increased echogenicity of  the kidneys as can be seen in medical renal disease. Electronically Signed   By: Corean Salter M.D.   On: 08/06/2024 18:07   CT ABDOMEN PELVIS WO CONTRAST Result Date: 08/04/2024 CLINICAL DATA:  Left lower quadrant pain EXAM: CT ABDOMEN AND PELVIS WITHOUT CONTRAST TECHNIQUE: Multidetector CT imaging of the abdomen and pelvis was performed following the standard protocol without IV contrast. RADIATION DOSE REDUCTION: This exam was performed according to the departmental dose-optimization program which includes automated exposure control, adjustment of the mA and/or kV according to patient size and/or use of iterative reconstruction technique. COMPARISON:  CT 06/06/2024, 10/29/2023, 01/30/2024 FINDINGS: Lower chest: Advanced aortic atherosclerosis. Coronary vascular calcification. Cardiomegaly. Moderate to large hiatal hernia. Partially imaged slightly loculated appearing small moderate left pleural effusion. Mild subpleural atelectasis or minimal aspiration in the posterior right lung base. Hepatobiliary: No focal liver abnormality is seen. No gallstones, gallbladder wall thickening, or biliary dilatation. Pancreas: Unremarkable. No pancreatic ductal dilatation or surrounding inflammatory changes. Spleen: Normal in size without focal abnormality. Adrenals/Urinary Tract: Adrenal glands are normal. Mild cortical atrophy of the left kidney. Multiple cysts and cortical hyperdensities, limited characterization without contrast but better characterized on prior MRI from 2024, no specific imaging follow-up recommended. 10 mm stone within the mid left kidney with additional smaller left kidney stones. Interim development of moderate severe left hydronephrosis and hydroureter, ureter dilated to the level of the pelvis, the distal ureter proximal to the bladder is decompressed. Bladder is unremarkable. Stomach/Bowel: Stomach is nondistended. No dilated small bowel. Diffuse diverticular disease of the colon. Focal wall  thickening and mild inflammatory change at the sigmoid colon. New gas and fluid collection along the left pelvic sidewall, estimated measurement of 5.9 x 2.3 cm on series 2, image 63. Further assessment limited without contrast. Smaller gas and fluid collection in the left inferior pelvis, series 2, image 68. Vascular/Lymphatic: Aortic atherosclerosis. No enlarged abdominal or pelvic lymph nodes. Reproductive: Negative for adnexal mass Other: No ascites.  Small fat containing umbilical hernia Musculoskeletal: Scoliosis and degenerative changes. No acute osseous abnormality. IMPRESSION:  1. Diffuse diverticular disease of the colon. Focal wall thickening and mild inflammatory change at the sigmoid colon, probably representing diverticulitis with colon mass not excluded. New gas and fluid collection along the left pelvic sidewall measuring up to 5.9 cm, suspicious for focal/contained perforation or abscess, assessment limited in the absence of intravenous contrast. Smaller gas and fluid collection in the left inferior pelvis, this is indeterminate for an additional gas and fluid collection versus fluid containing inflamed large diverticulum. 2. Interim development of moderate to severe left hydronephrosis and hydroureter, ureter dilated to the level of the pelvis/area of inflammatory change at sigmoid colon. Suspect obstruction of the distal left ureter by the colon inflammatory changes and gas and fluid collection. 3. Partially imaged slightly loculated appearing small to moderate left pleural effusion. Mild subpleural atelectasis or minimal aspiration in the posterior right lung base. 4. Moderate to large hiatal hernia. 5. Aortic atherosclerosis. These results will be called to the ordering clinician or representative by the Radiologist Assistant, and communication documented in the PACS or [employer]. Aortic Atherosclerosis (ICD10-I70.0). Electronically Signed   By: Luke Bun M.D.   On: 08/04/2024 22:20     Scheduled Meds:  atorvastatin   80 mg Oral Daily   budesonide -glycopyrrolate -formoterol   2 puff Inhalation Daily   Chlorhexidine  Gluconate Cloth  6 each Topical Daily   ferrous sulfate   325 mg Oral Q breakfast   levothyroxine   175 mcg Oral Once per day on Monday Tuesday Wednesday Thursday   pantoprazole   40 mg Oral Daily   polyethylene glycol  17 g Oral Daily   Ensure Max Protein  11 oz Oral BID BM   Continuous Infusions:  meropenem  (MERREM ) IV 200 mL/hr at 08/06/24 1625   metronidazole  100 mL/hr at 08/06/24 1625     LOS: 3 days   Leatrice LILLETTE Chapel, MD [facility] P12/13/2025, 7:56 PMINR 10,  iron  panel reveals iron  of 10, iron  panel reveals

## 2024-08-06 NOTE — Plan of Care (Signed)

## 2024-08-06 NOTE — Progress Notes (Signed)
° ° ° °  Patient Name: Abigail [name]           DOB: April 21, 1936  MRN: [id number]      Admission Date: 08/03/2024  Attending Provider: Rosario Leatrice FERNS, MD  Primary Diagnosis: AKI (acute kidney injury)   Level of care: Progressive   OVERNIGHT EVENT   HPI/ Events of Note [name], 88 y.o. female, was admitted on 08/03/2024 for AKI (acute kidney injury).   Acute on chronic normocytic anemia  Hemoglobin 6.9 Baseline hemoglobin ~7-8 No acute changes reported.  Hemodynamically stable. No melena, hematochezia, or other bleeding reported tonight.    Plan: Blood transfusion, 1 unit PRBC Recheck H&H after transfusion is completed, transfuse if HGB <7   Lavanda Horns, DNP, ACNPC- AG [facility]

## 2024-08-07 DIAGNOSIS — N1832 Chronic kidney disease, stage 3b: Secondary | ICD-10-CM

## 2024-08-07 LAB — GLUCOSE, CAPILLARY
Glucose-Capillary: 112 mg/dL — ABNORMAL HIGH (ref 70–99)
Glucose-Capillary: 124 mg/dL — ABNORMAL HIGH (ref 70–99)
Glucose-Capillary: 125 mg/dL — ABNORMAL HIGH (ref 70–99)
Glucose-Capillary: 148 mg/dL — ABNORMAL HIGH (ref 70–99)
Glucose-Capillary: 157 mg/dL — ABNORMAL HIGH (ref 70–99)
Glucose-Capillary: 196 mg/dL — ABNORMAL HIGH (ref 70–99)

## 2024-08-07 MED ORDER — BUDESON-GLYCOPYRROL-FORMOTEROL 160-9-4.8 MCG/ACT IN AERO
2.0000 | INHALATION_SPRAY | Freq: Two times a day (BID) | RESPIRATORY_TRACT | Status: DC
  Administered 2024-08-08 – 2024-09-03 (×48): 2 via RESPIRATORY_TRACT
  Filled 2024-08-07 (×2): qty 5.9

## 2024-08-07 MED ORDER — ALBUMIN HUMAN 25 % IV SOLN
25.0000 g | Freq: Once | INTRAVENOUS | Status: AC
  Administered 2024-08-07: 25 g via INTRAVENOUS
  Filled 2024-08-07: qty 100

## 2024-08-07 NOTE — Consult Note (Signed)
 Urology Consult   I have been asked to see the patient by Dr. Rosario, for evaluation and management of left hydronephrosis.  Chief Complaint: Lower abdominal pain  HPI:  Abigail [name] is a 88 y.o. extremely comorbid female with a number of medical issues including metastatic non-small cell lung cancer to brain currently getting brain radiation, COPD/chronic respiratory failure on 3 L of oxygen , CKD stage IIIb, skin cancer on her legs status post resection with problems with wound infections, CAD, anemia requiring recent transfusion.  She also has a history of recurrent UTIs, previously has been followed by Dr. Cam and Dr. Gaston with [facility].  She has baseline severe urgency, frequency, urge incontinence.  She was admitted on 08/03/2024 to hospitalist with fatigue and weakness, evaluation showed anemia and she was transfused, as well as AKI with a creatinine of 2.2, eGFR 21 from baseline value of creatinine 1.62, eGFR 30.  Urinalysis was contaminated with 6-10 squamous cells, culture ultimately grew 30k Pseudomonas.  CT scan notable for gas and fluid collection along the left pelvic sidewall up to 6 cm concerning for diverticulitis with focal contained perforation/abscess, and new moderate to severe left hydronephrosis down to the area of diverticulitis.  Her primary complaint is midline lower and left lower quadrant abdominal pain.  Denies any flank or back pain.  She has been on broad-spectrum antibiotics for diverticulitis and Pseudomonas in the urine.  General surgery has been consulted, and recommended conservative management with broad-spectrum antibiotics.  Palliative care has also been consulted.  Urology was consulted overnight for treatment options for the left hydronephrosis.  PMH: Past Medical History:  Diagnosis Date   Anemia    Arthritis    knees, back   Bacteremia 04/08/2012   CAP (community acquired pneumonia) 04/12/2012   Carotid artery occlusion     Chronic kidney disease    stage 3 ckd no nephrologist   Complete uterine prolapse with prolapse of anterior vaginal wall    Complication of anesthesia    hard to wake up per pt   Constipation    Coronary artery disease    Diverticulitis yrs ago coialitis   Dyspnea    History of blood transfusion    History of radiation therapy    right lung 08/05/2021-09/18/2021  Dr Lynwood Nasuti   Hypertension    Hypothyroid    LLQ abdominal pain 04/05/2012   Lung cancer (HCC)    Numbness    in hands at times   Pneumonia    Pre-diabetes    Scoliosis    STEMI (ST elevation myocardial infarction) (HCC) 10/26/2019   DES RCA   Wears dentures    full dentures   Wears glasses    for reading    Surgical History: Past Surgical History:  Procedure Laterality Date   ANTERIOR AND POSTERIOR REPAIR WITH SACROSPINOUS FIXATION N/A 12/20/2020   Procedure: SACROSPINOUS LIGAMENT FIXATION;  Surgeon: Marilynne Rosaline SAILOR, MD;  Location: [facility];  Service: Gynecology;  Laterality: N/A;  Total time requested for all procedures is 2 hours   BACK SURGERY  1980 age 59   lower   bartholin cyst removal  age 71's   BLADDER SUSPENSION N/A 12/20/2020   Procedure: TRANSVAGINAL TAPE (TVT) PROCEDURE;  Surgeon: Marilynne Rosaline SAILOR, MD;  Location: [facility];  Service: Gynecology;  Laterality: N/A;   BRONCHIAL BRUSHINGS  07/15/2021   Procedure: BRONCHIAL BRUSHINGS;  Surgeon: Shelah Lamar RAMAN, MD;  Location: [facility] ENDOSCOPY;  Service:  Pulmonary;;   BRONCHIAL NEEDLE ASPIRATION BIOPSY  07/15/2021   Procedure: BRONCHIAL NEEDLE ASPIRATION BIOPSIES;  Surgeon: Shelah Lamar RAMAN, MD;  Location: [facility] ENDOSCOPY;  Service: Pulmonary;;   CORONARY/GRAFT ACUTE MI REVASCULARIZATION N/A 10/26/2019   Procedure: Coronary/Graft Acute MI Revascularization;  Surgeon: Wonda Sharper, MD;  Location: [facility] INVASIVE CV LAB;  Service: Cardiovascular;  Laterality: N/A;   CYSTOCELE REPAIR N/A 12/20/2020   Procedure:  ANTERIOR AND POSTERIOR REPAIR WITH PERINEORRHAPHY;  Surgeon: Marilynne Rosaline SAILOR, MD;  Location: [facility];  Service: Gynecology;  Laterality: N/A;   CYSTOSCOPY N/A 12/20/2020   Procedure: CYSTOSCOPY;  Surgeon: Marilynne Rosaline SAILOR, MD;  Location: [facility];  Service: Gynecology;  Laterality: N/A;   ELBOW SURGERY  1990's   left   ENDOBRONCHIAL ULTRASOUND N/A 07/15/2021   Procedure: ENDOBRONCHIAL ULTRASOUND;  Surgeon: Shelah Lamar RAMAN, MD;  Location: [facility] ENDOSCOPY;  Service: Pulmonary;  Laterality: N/A;   HEMOSTASIS CONTROL  07/15/2021   Procedure: HEMOSTASIS CONTROL;  Surgeon: Shelah Lamar RAMAN, MD;  Location: [facility] ENDOSCOPY;  Service: Pulmonary;;   LEFT HEART CATH AND CORONARY ANGIOGRAPHY N/A 10/26/2019   Procedure: LEFT HEART CATH AND CORONARY ANGIOGRAPHY;  Surgeon: Wonda Sharper, MD;  Location: [facility] INVASIVE CV LAB;  Service: Cardiovascular;  Laterality: N/A;   TONSILLECTOMY      Allergies: Allergies[1]  Family History: Family History  Problem Relation Age of Onset   Hypertension Mother    Colon cancer Neg Hx    Stomach cancer Neg Hx    Esophageal cancer Neg Hx     Social History:  reports that she quit smoking about 38 years ago. Her smoking use included cigarettes. She started smoking about 73 years ago. She has a 52.5 pack-year smoking history. She has never used smokeless tobacco. She reports that she does not drink alcohol and does not use drugs.  Physical Exam: BP (!) 136/56 (BP Location: Right Arm)   Pulse (!) 102   Temp (!) 97.5 F (36.4 C) (Oral)   Resp 20   Ht 5' 6 (1.676 m)   Wt 71.5 kg   SpO2 96%   BMI 25.44 kg/m    Constitutional: Chronically ill-appearing, conversational Cardiovascular: No clubbing, cyanosis, or edema. Respiratory: On oxygen  GI: Abdomen is soft, mildly tender, no peritoneal signs   Laboratory Data: Reviewed in epic, see HPI  Pertinent Imaging: I have personally reviewed the CT scan showing likely  diverticulitis with 6 cm abscess/perforation with new left hydronephrosis down to the area of inflammation from prior CT in October 2025.  Nonobstructive left lower pole renal stone.  Assessment & Plan:   Extremely comorbid 88 year old female with metastatic non-small cell cancer to the brain, COPD on 3 L oxygen  at baseline, CKD, CAD, currently admitted for diverticulitis with contained abscess/perforation, as well as new left hydronephrosis down to the area of inflammation.  I had an extensive conversation with patient and her son this morning about options including observation alone with antibiotics, left ureteral stent placement, or left-sided nephrostomy tube.  Risks and benefits were discussed extensively.  Unfortunately, with her overall poor clinical picture and prognosis, I am not sure a left-sided ureteral stent would make a major impact in her overall picture.  Possible benefits of improve drainage of the left kidney with improved renal function and clearance of any urinary infection discussed, as well as risks of worsening urinary symptoms, flank pain, gross hematuria, perforation, inability to place stent were reviewed.  Nephrostomy tube would likely have significant negative impact on  her quality life.  Extensive conversation had at bedside, and ultimately patient and son opted to defer stent placement and continue with antibiotics alone.  Agree that the diverticulitis with possible perforation/abscess is likely her primary acute issue.  Can re-visit ureteral stent again tomorrow.  Recommendations:  - Okay for diet from urology perspective today, full liquids per general surgery - NPO at midnight and will re-discuss left ureteral stent tomorrow - Unfortunately need to have realistic expectations with overall poor prognosis  Redell JAYSON Burnet, MD  Total time spent on the floor was 85 minutes, with greater than 50% spent in counseling and coordination of care with the patient regarding  multitude of medical issues including metastatic non-small cell cancer, diverticulitis/abscess, left hydronephrosis, relatively stable renal function, and possible role of urinary infection and her overall clinical picture, as well as risks and benefits of left ureteral stent placement.  [facility] [address] [place], [place] [address] [phone]      [1]  Allergies Allergen Reactions   Codeine Nausea And Vomiting   Norvasc  [Amlodipine ] Rash

## 2024-08-07 NOTE — Progress Notes (Signed)
 Subjective/Chief Complaint: Pt difficult to question as doesn't give straight answers, but eventually got that pain was a little better today.     Objective: Vital signs in last 24 hours: Temp:  [97.5 F (36.4 C)-98.6 F (37 C)] 97.5 F (36.4 C) (12/14 0442) Pulse Rate:  [78-102] 102 (12/14 0442) Resp:  [16-22] 20 (12/14 0442) BP: (100-136)/(42-58) 136/56 (12/14 0442) SpO2:  [95 %-99 %] 96 % (12/14 0442) Last BM Date : 08/03/24  Intake/Output from previous day: 12/13 0701 - 12/14 0700 In: 1024.5 [P.O.:480; I.V.:26.5; Blood:318; IV Piggyback:200] Out: 300 [Urine:300] Intake/Output this shift: No intake/output data recorded.   General:  alert and oriented.   Resp:  breathing comfortably.  Abd:  soft, non distended. No appreciable localized tenderness  Lab Results:  Recent Labs    08/05/24 0120 08/06/24 0414 08/06/24 1907  WBC 8.1 7.6  --   HGB 7.1* 6.9* 8.2*  HCT 22.9* 22.0* 26.5*  PLT 243 232  --    BMET Recent Labs    08/05/24 0120 08/06/24 0414  NA 134* 133*  K 4.0 4.3  CL 102 103  CO2 21* 21*  GLUCOSE 130* 120*  BUN 47* 50*  CREATININE 1.97* 2.00*  CALCIUM  7.8* 7.9*   PT/INR No results for input(s): LABPROT, INR in the last 72 hours. ABG No results for input(s): PHART, HCO3 in the last 72 hours.  Invalid input(s): PCO2, PO2  Studies/Results: DG Abd Portable 1V Result Date: 08/06/2024 CLINICAL DATA:  [id number] Nausea AND vomiting [id number] EXAM: PORTABLE ABDOMEN - 1 VIEW COMPARISON:  August 04, 2024 FINDINGS: Air and stool filled nondilated loops of bowel. Enteric contrast delineates predominantly bowel loops within the RIGHT hemicolon and transverse colon. Air is visualized in the rectum. Degenerative changes of the lumbar spine. IMPRESSION: Nonobstructive bowel gas pattern. Known colonic perforation is not visualized radiographically. Electronically Signed   By: Corean Salter M.D.   On: 08/06/2024 19:15   US  RENAL Result Date:  08/06/2024 CLINICAL DATA:  [id number] Hydronephrosis of left kidney [id number] EXAM: RENAL / URINARY TRACT ULTRASOUND COMPLETE COMPARISON:  August 04, 2024 FINDINGS: Right Kidney: Renal measurements: 10.1 x 4.6 x 4.8 cm = volume: 114 mL. Echogenicity is mildly increased. No hydronephrosis visualized. Benign cyst is noted measuring 14 mm (for which no dedicated imaging follow-up is recommended). Left Kidney: Renal measurements: 11.7 x 5.7 x 4.7 cm = volume: 163 mL. Echogenicity is increased. There is severe hydronephrosis. Bladder: Not visualized secondary to shadowing bowel gas Other: None. IMPRESSION: 1. Severe left hydronephrosis. 2. Increased echogenicity of the kidneys as can be seen in medical renal disease. Electronically Signed   By: Corean Salter M.D.   On: 08/06/2024 18:07    Anti-infectives: Anti-infectives (From admission, onward)    Start     Dose/Rate Route Frequency Ordered Stop   08/06/24 0415  meropenem  (MERREM ) 1 g in sodium chloride  0.9 % 100 mL IVPB        1 g 200 mL/hr over 30 Minutes Intravenous Every 12 hours 08/06/24 0317     08/04/24 2345  metroNIDAZOLE  (FLAGYL ) IVPB 500 mg        500 mg 100 mL/hr over 60 Minutes Intravenous Every 12 hours 08/04/24 2256     08/04/24 1300  cefTRIAXone  (ROCEPHIN ) 2 g in sodium chloride  0.9 % 100 mL IVPB  Status:  Discontinued        2 g 200 mL/hr over 30 Minutes Intravenous Every 24 hours 08/03/24 1220 08/03/24 1236   08/03/24  1300  cefTRIAXone  (ROCEPHIN ) 2 g in sodium chloride  0.9 % 100 mL IVPB  Status:  Discontinued        2 g 200 mL/hr over 30 Minutes Intravenous Every 24 hours 08/03/24 1236 08/06/24 0317   08/03/24 1200  cefTRIAXone  (ROCEPHIN ) 1 g in sodium chloride  0.9 % 100 mL IVPB  Status:  Discontinued        1 g 200 mL/hr over 30 Minutes Intravenous Every 24 hours 08/03/24 1154 08/03/24 1220   08/03/24 1145  nitrofurantoin  (MACRODANTIN ) capsule 100 mg  Status:  Discontinued        100 mg Oral Daily 08/03/24 1143 08/03/24 1154        Assessment/Plan:  Presumed sigmoid diverticulitis with abscess Possibility of mass in this region.  No recent colonoscopy and dx of metastatic lung cancer.  No overt signs of mass, but unable to exclude.    Will try full liquids for diet today.   Anemia of chronic dx and possible chronic blood loss anemia- improved post transfusion.   Continue non operative management with IV antibiotics and no advancement of diet at this point.      LOS: 4 days    Jina LITTIE Nephew, MD, FACS, FSSO Surgical Oncology, General Surgery, Trauma and Critical [facility] Surgery, [place] [phone] for weekday/non holidays [email or web address] for coverage night/weekend/holidays under General Surgery

## 2024-08-07 NOTE — Progress Notes (Signed)
 Mobility Specialist - Progress Note  ( [place] 3L) Pre-mobility: 93 bpm HR, 96% SpO2 Post-mobility: 103 bpm HR, 97% SPO2   08/07/24 0905  Mobility  Activity Ambulated with assistance  Level of Assistance Moderate assist, patient does 50-74%  Assistive Device Front wheel walker  Distance Ambulated (ft) 4 ft  Range of Motion/Exercises Active  Activity Response Tolerated well  Mobility visit 1 Mobility  Mobility Specialist Start Time (ACUTE ONLY) 0815  Mobility Specialist Stop Time (ACUTE ONLY) 0840  Mobility Specialist Time Calculation (min) (ACUTE ONLY) 25 min   Pt was found in bed and agreeable to mobilize. Grew very fatigued after a couple steps. NT followed with recliner chair and pt able to sit. At EOS was left on recliner chair with all needs met. Call bell in reach and chair alarm on.   Erminio Leos,  Mobility Specialist Can be reached via Secure Chat

## 2024-08-07 NOTE — Progress Notes (Signed)
 PROGRESS NOTE  Abigail [name]  FMW:[id number] DOB: February 28, 1936 DOA: 08/03/2024 PCP: [name], FNP   Brief Narrative:  Abigail [name] is a 88 y.o. female with medical history significant of metastatic non-small lung cancer to brain, currently on radiation treatment, chronic hypoxic respiratory failure on 3 L of oxygen  per minute, CKD stage IIIb, 2 skin cancers on her legs s/p resection, hypertension, coronary artery disease, COPD on 3 L of oxygen  per minute, chronic normocytic anemia, hypothyroidism who presented from home with complaint of generalized weakness.  Labs showed creatinine of 2.2, hemoglobin of 6.7.  Started on IV fluids and given a unit of PRBC transfusion.  PT/OT consulted.  Oncology also consulted.  08/05/2024: Patient seen.  Patient continues to report weakness.  Patient Particularly great historian.  08/06/2024: Patient seen.  Patient looks better today.  Patient is more communicative.  Patient was seen alongside patient's cousin.  Called patient's daughter to update her, however, call went to voicemail.  Voicemail was left for patient's daughter.  Continue meropenem  and Flagyl  day.  Renal ultrasound (left hydronephrosis/hydroureter).  Per patient's request, will likely consult Dr. Gatha on Monday, 08/08/2024.  08/07/2024: Patient seen.  No new changes.  Input from general surgery and urology team is highly appreciated.  General surgery has advised conservative management of the diverticular perforation.  Patient is said to decide if she will want to proceed with further management of left hydronephrosis and hydroureter.  Continue Flagyl  IV and IV meropenem .  Will give IV albumin  25 g times 1 dose.  Renal panel, magnesium  and CBC in the morning.  Assessment & Plan:  Principal Problem:   AKI (acute kidney injury) Active Problems:   Hyperlipidemia   Hypothyroidism   CKD (chronic kidney disease), stage III (HCC)   Essential hypertension   Coronary artery disease of native  artery of native heart with stable angina pectoris   Malignant neoplasm of unspecified part of unspecified bronchus or lung (HCC)   COPD (chronic obstructive pulmonary disease) (HCC)   Symptomatic anemia   Chronic iron  deficiency anemia   Weakness   Urinary tract infection   Non-small cell lung cancer (HCC)   AKI on CKD stage IIIb: - Baseline creatinine around 1.6. - Admitted with serum creatinine of 2.2 -Poor oral intake prior to presentation. - Last serum creatinine was 2.  Repeat renal panel in the morning. - IV albumin  25 g x 1 dose.  Albumin  is 2.4.  Leg edema is noted. -Renal ultrasound revealed severe left-sided hydronephrosis. - Urology input is appreciated.  Patient has yet to decided to proceed with further management of the severe left-sided hydronephrosis.   - Continue to monitor renal function and electrolytes. - Avoid nephrotoxins. - Keep MAP greater than 65 mmHg.   Metastatic non-small cell lung cancer with solitary cerebral brain metastasis: -Follows with Dr. Sherrod.  Completed concurrent chemoradiation and 1 year of Durvalumab  consolidation therapy.  Currently on stereotactic radiosurgery for brain mets.  She completed the course of Decadron .  Follows with radiation oncology.   oncology/Dr. Sherrod consulted   Chronic hypoxic respiratory failure/COPD:  -Currently not on exacerbation.  On 3 L of oxygen  chronically at home.  Continue bronchodilators, inhalers.   -Not in acute exacerbation at the moment.   Acute on chronic normocytic anemia:  -Baseline hemoglobin around 7-8. - Admitted with hemoglobin of 6.8 g/dL.  Likely associated with malignancy.  No evidence of acute blood loss.   -Given a unit of PRBC.   - Hemoglobin of 7.1  g/dL today. - Continue to monitor closely.  -Iron  panel reveals iron  of 10, TSAT of 5, TIBC of 227. -Likely anemia of chronic inflammation.  Patient follows up with oncology team. -Continue ferrous sulfate .   Hypertension: - Blood  pressure is controlled.   -Takes metoprolol  at home.  Currently on hold.     History of coronary artery disease/elevated troponin:  -On aspirin , Lipitor , Repatha .  No anginal symptoms.  Takes aspirin  at home, currently on hold, will resume on discharge if no evidence of GI bleed. Denies any chest pain today.  Elevated troponin is likely from demand ischemia from anemia.   History of skin cancer:  -Underwent resection of the skin tumor on the left leg on October of this year.  Wound care consulted.  Wound appears to be slightly infected with purulent material.  Continue ceftriaxone    History of recurrent UTI:  -On chronic therapy with nitrofurantoin . She has  noted frequency, dysuria symptoms.  Also complained of left lower quadrant pain.  Checking CT abdomen/pelvis without contrast.  Started on ceftriaxone .  Urine culture showing 30,000 colonies of Pseudomonas along with mixed organisms. 08/05/2024: Will likely discontinue Rocephin .  Consult pharmacy for meropenem . 08/07/2024: Continue IV meropenem .   Debility/deconditioning/generalized weakness:  -Patient lives with son.But on son is at at work all time time.  Will consult PT.  Ambulates with rollator.  Son is agreeable for SNF if needed 12/25: Further PT/OT recommendation  Diffuse diverticular disease of the colon/diverticulitis of sigmoid colon with colon mass not excluded/suspicious focal/contained perforation and abscess: - Continue antibiotics (meropenem  and Flagyl ).   Goals of care: Elderly patient with multiple comorbidities including metastatic lung cancer.  We consulted palliative care for goals of care.  I have a long discussion with her son [name] on phone today.  He understands her poor quality of life.  He is interested to discuss goals of care.       DVT prophylaxis:SCDs Start: 08/03/24 1141     Code Status: Full Code  Family Communication:   Patient status: Inpatient  Patient is from : Home  Anticipated discharge  to: Home versus SNF  Estimated DC date: 2-3 days   Consultants: -Oncology - General Surgery - Urology  Procedures: None  Antimicrobials:  Anti-infectives (From admission, onward)    Start     Dose/Rate Route Frequency Ordered Stop   08/06/24 0415  meropenem  (MERREM ) 1 g in sodium chloride  0.9 % 100 mL IVPB        1 g 200 mL/hr over 30 Minutes Intravenous Every 12 hours 08/06/24 0317     08/04/24 2345  metroNIDAZOLE  (FLAGYL ) IVPB 500 mg        500 mg 100 mL/hr over 60 Minutes Intravenous Every 12 hours 08/04/24 2256     08/04/24 1300  cefTRIAXone  (ROCEPHIN ) 2 g in sodium chloride  0.9 % 100 mL IVPB  Status:  Discontinued        2 g 200 mL/hr over 30 Minutes Intravenous Every 24 hours 08/03/24 1220 08/03/24 1236   08/03/24 1300  cefTRIAXone  (ROCEPHIN ) 2 g in sodium chloride  0.9 % 100 mL IVPB  Status:  Discontinued        2 g 200 mL/hr over 30 Minutes Intravenous Every 24 hours 08/03/24 1236 08/06/24 0317   08/03/24 1200  cefTRIAXone  (ROCEPHIN ) 1 g in sodium chloride  0.9 % 100 mL IVPB  Status:  Discontinued        1 g 200 mL/hr over 30 Minutes Intravenous Every 24 hours 08/03/24  1154 08/03/24 1220   08/03/24 1145  nitrofurantoin  (MACRODANTIN ) capsule 100 mg  Status:  Discontinued        100 mg Oral Daily 08/03/24 1143 08/03/24 1154       Subjective: Patient seen and examined No new changes.    Objective: Vitals:   08/06/24 2021 08/07/24 0442 08/07/24 1017 08/07/24 1344  BP: (!) 126/48 (!) 136/56  (!) 112/53  Pulse: 82 (!) 102  82  Resp: (!) 22 20  17   Temp: 98.4 F (36.9 C) (!) 97.5 F (36.4 C)  97.8 F (36.6 C)  TempSrc: Oral Oral  Oral  SpO2: 96% 96% 96% 95%  Weight:      Height:        Intake/Output Summary (Last 24 hours) at 08/07/2024 1553 Last data filed at 08/07/2024 0500 Gross per 24 hour  Intake 226.5 ml  Output 300 ml  Net -73.5 ml   Filed Weights   08/03/24 0908 08/03/24 1359  Weight: 70.8 kg 71.5 kg    Examination:  General exam:  Chronically ill looking.   Respiratory system: Clear to auscultation. Cardiovascular system: S1 & S2 heard, RRR.  Gastrointestinal system: Abdomen is obese, soft and nontender. Central nervous system: Awake and alert.     Data Reviewed: I have personally reviewed following labs and imaging studies  CBC: Recent Labs  Lab 08/03/24 0920 08/04/24 0420 08/05/24 0120 08/06/24 0414 08/06/24 1907  WBC 8.8 7.4 8.1 7.6  --   NEUTROABS 7.8*  --   --  6.7  --   HGB 6.8* 7.3* 7.1* 6.9* 8.2*  HCT 22.7* 23.5* 22.9* 22.0* 26.5*  MCV 90.1 87.7 89.1 89.1  --   PLT 276 240 243 232  --    Basic Metabolic Panel: Recent Labs  Lab 08/03/24 0920 08/04/24 0420 08/05/24 0120 08/06/24 0414  NA 136 136 134* 133*  K 4.3 4.0 4.0 4.3  CL 99 104 102 103  CO2 24 21* 21* 21*  GLUCOSE 148* 125* 130* 120*  BUN 53* 50* 47* 50*  CREATININE 2.20* 1.96* 1.97* 2.00*  CALCIUM  8.7* 7.8* 7.8* 7.9*  MG  --   --  2.0 1.9  PHOS  --   --  3.3 3.2     Recent Results (from the past 240 hours)  Resp panel by RT-PCR (RSV, Flu A&B, Covid) Anterior Nasal Swab     Status: None   Collection Time: 08/03/24  9:21 AM   Specimen: Anterior Nasal Swab  Result Value Ref Range Status   SARS Coronavirus 2 by RT PCR NEGATIVE NEGATIVE Final    Comment: (NOTE) SARS-CoV-2 target nucleic acids are NOT DETECTED.  The SARS-CoV-2 RNA is generally detectable in upper respiratory specimens during the acute phase of infection. The lowest concentration of SARS-CoV-2 viral copies this assay can detect is 138 copies/mL. A negative result does not preclude SARS-Cov-2 infection and should not be used as the sole basis for treatment or other patient management decisions. A negative result may occur with  improper specimen collection/handling, submission of specimen other than nasopharyngeal swab, presence of viral mutation(s) within the areas targeted by this assay, and inadequate number of viral copies(<138 copies/mL). A negative  result must be combined with clinical observations, patient history, and epidemiological information. The expected result is Negative.  Fact Sheet for Patients:  bloggercourse.com  Fact Sheet for Healthcare Providers:  seriousbroker.it  This test is no t yet approved or cleared by the [place]  FDA and  has been authorized  for detection and/or diagnosis of SARS-CoV-2 by FDA under an Emergency Use Authorization (EUA). This EUA will remain  in effect (meaning this test can be used) for the duration of the COVID-19 declaration under Section 564(b)(1) of the Act, 21 U.S.C.section 360bbb-3(b)(1), unless the authorization is terminated  or revoked sooner.       Influenza A by PCR NEGATIVE NEGATIVE Final   Influenza B by PCR NEGATIVE NEGATIVE Final    Comment: (NOTE) The Xpert Xpress SARS-CoV-2/FLU/RSV plus assay is intended as an aid in the diagnosis of influenza from Nasopharyngeal swab specimens and should not be used as a sole basis for treatment. Nasal washings and aspirates are unacceptable for Xpert Xpress SARS-CoV-2/FLU/RSV testing.  Fact Sheet for Patients: bloggercourse.com  Fact Sheet for Healthcare Providers: seriousbroker.it  This test is not yet approved or cleared by the [place]  FDA and has been authorized for detection and/or diagnosis of SARS-CoV-2 by FDA under an Emergency Use Authorization (EUA). This EUA will remain in effect (meaning this test can be used) for the duration of the COVID-19 declaration under Section 564(b)(1) of the Act, 21 U.S.C. section 360bbb-3(b)(1), unless the authorization is terminated or revoked.     Resp Syncytial Virus by PCR NEGATIVE NEGATIVE Final    Comment: (NOTE) Fact Sheet for Patients: bloggercourse.com  Fact Sheet for Healthcare Providers: seriousbroker.it  This  test is not yet approved or cleared by the [place]  FDA and has been authorized for detection and/or diagnosis of SARS-CoV-2 by FDA under an Emergency Use Authorization (EUA). This EUA will remain in effect (meaning this test can be used) for the duration of the COVID-19 declaration under Section 564(b)(1) of the Act, 21 U.S.C. section 360bbb-3(b)(1), unless the authorization is terminated or revoked.  Performed at [facility], [address] [place], [place] [address]   Urine Culture (for pregnant, neutropenic or urologic patients or patients with an indwelling urinary catheter)     Status: Abnormal   Collection Time: 08/03/24 12:28 PM   Specimen: Urine, Clean Catch  Result Value Ref Range Status   Specimen Description   Final    URINE, CLEAN CATCH Performed at [facility], [address] [place], [place] [address]    Special Requests   Final    NONE Performed at [facility], [address] [place], [place] [address]    Culture (A)  Final    30,000 COLONIES/mL PSEUDOMONAS AERUGINOSA WITHIN MIXED ORGANISMS Performed at [facility], [address], [place], [place] [address]    Report Status 08/05/2024 FINAL  Final   Organism ID, Bacteria PSEUDOMONAS AERUGINOSA (A)  Final      Susceptibility   Pseudomonas aeruginosa - MIC*    MEROPENEM  <=0.25 SENSITIVE Sensitive     CIPROFLOXACIN  >=4 RESISTANT Resistant     IMIPENEM 2 SENSITIVE Sensitive     PIP/TAZO Value in next row Sensitive      16 SENSITIVEThis is a modified FDA-approved test that has been validated and its performance characteristics determined by the reporting laboratory.  This laboratory is certified under the Clinical Laboratory Improvement Amendments CLIA as qualified to perform high complexity clinical laboratory testing.    CEFTAZIDIME /AVIBACTAM Value in next row Sensitive      16 SENSITIVEThis is a modified FDA-approved test that has been  validated and its performance characteristics determined by the reporting laboratory.  This laboratory is certified under the Clinical Laboratory Improvement Amendments CLIA as qualified to perform high complexity  clinical laboratory testing.    CEFTOLOZANE/TAZOBACTAM Value in next row Sensitive      16 SENSITIVEThis is a modified FDA-approved test that has been validated and its performance characteristics determined by the reporting laboratory.  This laboratory is certified under the Clinical Laboratory Improvement Amendments CLIA as qualified to perform high complexity clinical laboratory testing.    TOBRAMYCIN Value in next row Sensitive      16 SENSITIVEThis is a modified FDA-approved test that has been validated and its performance characteristics determined by the reporting laboratory.  This laboratory is certified under the Clinical Laboratory Improvement Amendments CLIA as qualified to perform high complexity clinical laboratory testing.    CEFTAZIDIME  Value in next row Sensitive      16 SENSITIVEThis is a modified FDA-approved test that has been validated and its performance characteristics determined by the reporting laboratory.  This laboratory is certified under the Clinical Laboratory Improvement Amendments CLIA as qualified to perform high complexity clinical laboratory testing.    * 30,000 COLONIES/mL PSEUDOMONAS AERUGINOSA     Radiology Studies: DG Abd Portable 1V Result Date: 08/06/2024 CLINICAL DATA:  [id number] Nausea AND vomiting [id number] EXAM: PORTABLE ABDOMEN - 1 VIEW COMPARISON:  August 04, 2024 FINDINGS: Air and stool filled nondilated loops of bowel. Enteric contrast delineates predominantly bowel loops within the RIGHT hemicolon and transverse colon. Air is visualized in the rectum. Degenerative changes of the lumbar spine. IMPRESSION: Nonobstructive bowel gas pattern. Known colonic perforation is not visualized radiographically. Electronically Signed   By: Corean Salter M.D.    On: 08/06/2024 19:15   US  RENAL Result Date: 08/06/2024 CLINICAL DATA:  [id number] Hydronephrosis of left kidney [id number] EXAM: RENAL / URINARY TRACT ULTRASOUND COMPLETE COMPARISON:  August 04, 2024 FINDINGS: Right Kidney: Renal measurements: 10.1 x 4.6 x 4.8 cm = volume: 114 mL. Echogenicity is mildly increased. No hydronephrosis visualized. Benign cyst is noted measuring 14 mm (for which no dedicated imaging follow-up is recommended). Left Kidney: Renal measurements: 11.7 x 5.7 x 4.7 cm = volume: 163 mL. Echogenicity is increased. There is severe hydronephrosis. Bladder: Not visualized secondary to shadowing bowel gas Other: None. IMPRESSION: 1. Severe left hydronephrosis. 2. Increased echogenicity of the kidneys as can be seen in medical renal disease. Electronically Signed   By: Corean Salter M.D.   On: 08/06/2024 18:07    Scheduled Meds:  atorvastatin   80 mg Oral Daily   budesonide -glycopyrrolate -formoterol   2 puff Inhalation Daily   Chlorhexidine  Gluconate Cloth  6 each Topical Daily   ferrous sulfate   325 mg Oral Q breakfast   levothyroxine   175 mcg Oral Once per day on Monday Tuesday Wednesday Thursday   pantoprazole   40 mg Oral Daily   polyethylene glycol  17 g Oral Daily   Ensure Max Protein  11 oz Oral BID BM   Continuous Infusions:  meropenem  (MERREM ) IV 1 g (08/07/24 0338)   metronidazole  500 mg (08/07/24 1142)     LOS: 4 days   Abigail LILLETTE Chapel, MD [facility] P12/14/2025, 3:53 PMINR 10,  iron  panel reveals iron  of 10, iron  panel reveals

## 2024-08-08 ENCOUNTER — Telehealth: Payer: Self-pay | Admitting: Internal Medicine

## 2024-08-08 ENCOUNTER — Inpatient Hospital Stay (HOSPITAL_COMMUNITY)

## 2024-08-08 DIAGNOSIS — R609 Edema, unspecified: Secondary | ICD-10-CM

## 2024-08-08 DIAGNOSIS — N179 Acute kidney failure, unspecified: Secondary | ICD-10-CM | POA: Diagnosis not present

## 2024-08-08 LAB — TYPE AND SCREEN
ABO/RH(D): B POS
Antibody Screen: NEGATIVE
Unit division: 0

## 2024-08-08 LAB — RENAL FUNCTION PANEL
Albumin: 2.8 g/dL — ABNORMAL LOW (ref 3.5–5.0)
Anion gap: 11 (ref 5–15)
BUN: 56 mg/dL — ABNORMAL HIGH (ref 8–23)
CO2: 20 mmol/L — ABNORMAL LOW (ref 22–32)
Calcium: 8.2 mg/dL — ABNORMAL LOW (ref 8.9–10.3)
Chloride: 105 mmol/L (ref 98–111)
Creatinine, Ser: 2.37 mg/dL — ABNORMAL HIGH (ref 0.44–1.00)
GFR, Estimated: 19 mL/min — ABNORMAL LOW (ref >60)
Glucose, Bld: 113 mg/dL — ABNORMAL HIGH (ref 70–99)
Phosphorus: 3.7 mg/dL (ref 2.5–4.6)
Potassium: 4.8 mmol/L (ref 3.5–5.1)
Sodium: 136 mmol/L (ref 135–145)

## 2024-08-08 LAB — CBC WITH DIFFERENTIAL/PLATELET
Abs Immature Granulocytes: 0.66 K/uL — ABNORMAL HIGH (ref 0.00–0.07)
Basophils Absolute: 0.1 K/uL (ref 0.0–0.1)
Basophils Relative: 1 %
Eosinophils Absolute: 0.1 K/uL (ref 0.0–0.5)
Eosinophils Relative: 1 %
HCT: 26.7 % — ABNORMAL LOW (ref 36.0–46.0)
Hemoglobin: 8 g/dL — ABNORMAL LOW (ref 12.0–15.0)
Immature Granulocytes: 8 %
Lymphocytes Relative: 3 %
Lymphs Abs: 0.3 K/uL — ABNORMAL LOW (ref 0.7–4.0)
MCH: 27.9 pg (ref 26.0–34.0)
MCHC: 30 g/dL (ref 30.0–36.0)
MCV: 93 fL (ref 80.0–100.0)
Monocytes Absolute: 0.7 K/uL (ref 0.1–1.0)
Monocytes Relative: 9 %
Neutro Abs: 6.1 K/uL (ref 1.7–7.7)
Neutrophils Relative %: 78 %
Platelets: 239 K/uL (ref 150–400)
RBC: 2.87 MIL/uL — ABNORMAL LOW (ref 3.87–5.11)
RDW: 17.4 % — ABNORMAL HIGH (ref 11.5–15.5)
Smear Review: NORMAL
WBC: 7.9 K/uL (ref 4.0–10.5)
nRBC: 0 % (ref 0.0–0.2)

## 2024-08-08 LAB — GLUCOSE, CAPILLARY
Glucose-Capillary: 109 mg/dL — ABNORMAL HIGH (ref 70–99)
Glucose-Capillary: 127 mg/dL — ABNORMAL HIGH (ref 70–99)
Glucose-Capillary: 135 mg/dL — ABNORMAL HIGH (ref 70–99)
Glucose-Capillary: 179 mg/dL — ABNORMAL HIGH (ref 70–99)
Glucose-Capillary: 193 mg/dL — ABNORMAL HIGH (ref 70–99)

## 2024-08-08 LAB — BPAM RBC
Blood Product Expiration Date: 202601052359
ISSUE DATE / TIME: 202512131029
Unit Type and Rh: 7300

## 2024-08-08 LAB — MAGNESIUM: Magnesium: 2 mg/dL (ref 1.7–2.4)

## 2024-08-08 MED ORDER — METOPROLOL TARTRATE 5 MG/5ML IV SOLN
2.5000 mg | Freq: Once | INTRAVENOUS | Status: AC
  Administered 2024-08-08: 02:00:00 2.5 mg via INTRAVENOUS
  Filled 2024-08-08: qty 5

## 2024-08-08 MED ORDER — HYDROMORPHONE HCL 1 MG/ML IJ SOLN
0.5000 mg | INTRAMUSCULAR | Status: DC | PRN
  Administered 2024-08-16 – 2024-08-17 (×2): 0.5 mg via INTRAVENOUS
  Filled 2024-08-08 (×2): qty 0.5

## 2024-08-08 MED ORDER — SODIUM CHLORIDE 0.9 % IV BOLUS
250.0000 mL | Freq: Once | INTRAVENOUS | Status: AC
  Administered 2024-08-08: 03:00:00 250 mL via INTRAVENOUS

## 2024-08-08 MED ORDER — METOPROLOL TARTRATE 5 MG/5ML IV SOLN: 2.5000 mg | Freq: Once | INTRAVENOUS | Status: DC | PRN

## 2024-08-08 NOTE — Progress Notes (Signed)
 Daily Progress Note   Patient Name: Abigail [name]       Date: 08/08/2024 DOB: 01/16/1936  Age: 88 y.o. MRN#: [id number] Attending Physician: Rosario Leatrice FERNS, MD Primary Care Physician: [name], FNP Admit Date: 08/03/2024  Reason for Consultation/Follow-up: Establishing goals of care  Subjective: Resting in bed not responsive call placed and discussed with Abigail [name] chart reviewed  Length of Stay: 5  Current Medications: Scheduled Meds:   atorvastatin   80 mg Oral Daily   budesonide -glycopyrrolate -formoterol   2 puff Inhalation BID   Chlorhexidine  Gluconate Cloth  6 each Topical Daily   ferrous sulfate   325 mg Oral Q breakfast   levothyroxine   175 mcg Oral Once per day on Monday Tuesday Wednesday Thursday   pantoprazole   40 mg Oral Daily   polyethylene glycol  17 g Oral Daily   Ensure Max Protein  11 oz Oral BID BM    Continuous Infusions:  meropenem  (MERREM ) IV 1 g (08/08/24 0326)    PRN Meds: acetaminophen , albuterol , HYDROmorphone  (DILAUDID ) injection, metoprolol  tartrate, mouth rinse, polyethylene glycol  Physical Exam         Weak appearing elderly lady Appears decompensated chronically ill and essentially unresponsive Shallow regular breath sounds  Vital Signs: BP (!) 103/47 (BP Location: Right Arm)   Pulse 74   Temp 98.2 F (36.8 C) (Oral)   Resp 18   Ht 5' 6 (1.676 m)   Wt 71.5 kg   SpO2 97%   BMI 25.44 kg/m  SpO2: SpO2: 97 % O2 Device: O2 Device: Nasal Cannula O2 Flow Rate: O2 Flow Rate (L/min): 3 L/min  Intake/output summary:  Intake/Output Summary (Last 24 hours) at 08/08/2024 1551 Last data filed at 08/08/2024 0546 Gross per 24 hour  Intake 100 ml  Output 550 ml  Net -450 ml   LBM: Last BM Date : 08/08/24 Baseline Weight: Weight: 70.8  kg Most recent weight: Weight: 71.5 kg       Palliative Assessment/Data:      Patient Active Problem List   Diagnosis Date Noted   AKI (acute kidney injury) 08/03/2024   Secondary malignant neoplasm of brain (HCC) 07/18/2024   Wound disruption 07/17/2024   Non-small cell lung cancer (HCC) 07/17/2024   Need for influenza vaccination 07/17/2024   Nausea & vomiting 06/06/2024   Brain mass 06/06/2024  Dysuria 05/19/2024   History of UTI 03/09/2024   Urinary tract infection 01/29/2024   History of CAD (coronary artery disease) 01/29/2024   History of COPD 01/29/2024   Chronic weakness of both lower extremities 01/29/2024   QT prolongation 01/29/2024   COVID-19 vaccination declined 12/22/2023   Chest pain 10/11/2023   Overactive bladder [N32.81] 08/24/2023   Stage 3b chronic kidney disease (HCC) [N18.32] 08/24/2023   Hypertension with renal disease 08/24/2023   Foul smelling urine 06/21/2023   Hematuria 06/21/2023   Closed fracture of left upper limb 06/21/2023   Asymptomatic microscopic hematuria 04/08/2023   Right wrist pain 02/25/2023   Supplemental oxygen  dependent 02/25/2023   Chronic pain of both knees 02/25/2023   Atherosclerosis of aorta 02/25/2023   Hiatal hernia 11/20/2022   Weakness 11/20/2022   Chronic cough 10/22/2022   Nodule of kidney 09/15/2022   Left ureteral stone 09/07/2022   Gross hematuria 09/07/2022   Chronic hypoxic respiratory failure (HCC) 09/07/2022   Nephrolithiasis 09/07/2022   Staghorn calculus 09/07/2022   Chronic iron  deficiency anemia 04/21/2022   Symptomatic anemia 02/13/2022   Encounter for antineoplastic immunotherapy 11/20/2021   COPD (chronic obstructive pulmonary disease) (HCC) 08/20/2021   Dysphagia 08/20/2021   H/O: lung cancer 07/31/2021   Malignant neoplasm of unspecified part of unspecified bronchus or lung (HCC) 07/25/2021   Encounter for antineoplastic chemotherapy 07/25/2021   Mediastinal adenopathy 07/15/2021    Coronary artery disease of native artery of native heart with stable angina pectoris 04/11/2021   Right bundle branch block 04/11/2021   Newly recognized heart murmur 04/11/2021   Chest pain with moderate risk for cardiac etiology 12/01/2019   Urinary urgency 12/01/2019   Anxiety 02/09/2019   Prediabetes 12/29/2018   Essential hypertension 08/27/2018   Vitamin D  deficiency 06/25/2018   Hyperlipidemia 06/25/2018   Hypothyroidism 06/25/2018   CKD (chronic kidney disease), stage III (HCC) 06/25/2018   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  The patient diagnosed with acute on chronic kidney disease and left sided hydroureteronephrosis.  This hospital course already complicated by abscess/perforation of diverticulitis.  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 Also with underlying chronic normocytic anemia COPD on 3 L of oxygen  at home, hypertension coronary artery disease Patient admitted in the context of weakness. Palliative consult for goals of care   Recommendations/Plan: Call placed and was able to reach patient's Abigail [name].  He has heard from various providers today with regards to the patient's condition.  Patient lives at home with her Abigail [name].  Patient states that he has patient have an ongoing functional decline.  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  He  states that he understands that the patient has tolerated several treatments and has continued to decline and now has a new complications of such as left lower extremity DVT, worsening renal function and hydronephrosis.  He has experience with hospice care for various first-degree relatives.  We discussed about full code versus DNR/DNI.  At end-of-life, he does not want the patient to suffer.  We discussed about comfort measures. Plan: Patient's Abigail [name] is in agreement to proceed with DNR/DNI comfort measures we will monitor how the patient does in the hospital and palliative service will continue to be in touch with the patient's Abigail [name] with regards to proceeding with the residential hospice involvement of probably as early as in a.m. on 08-09-2024 All of Abigail Abigail [name]'s questions addressed to the best of my ability.  Goals of Care and Additional Recommendations: Limitations on Scope of Treatment: Full Comfort Care  Code Status:    Code Status Orders  (From admission, onward)           Start     Ordered   08/08/24 1541  Do not attempt resuscitation (DNR) - Comfort care  (Code Status)  Continuous       Question Answer Comment  If patient has no pulse and is not breathing Do Not Attempt Resuscitation   In Pre-Arrest Conditions (Patient Is Breathing and Has a Pulse) Provide comfort measures. Relieve any mechanical airway obstruction. Avoid transfer unless required for comfort.   Consent: Discussion documented in EHR or advanced directives reviewed      08/08/24 1541           Code Status History     Date Active Date Inactive Code Status Order ID Comments User Context   08/03/2024 1143 08/08/2024 1541 Full Code [id number]  Jillian Buttery, MD ED   06/06/2024 1503 06/10/2024 1740 Full Code [id number]   Sonjia Held, MD ED   01/29/2024 0026 02/04/2024 1730 Full Code [id number]  Lee Kingfisher, MD ED   09/07/2022 0236 09/07/2022 1607 Full Code [id number]  Howerter, Eva NOVAK, DO ED   12/20/2020 0905 12/20/2020 2312 Full Code [id number]  Marilynne Rosaline SAILOR, MD Inpatient   11/09/2020 0601 11/10/2020 2230 Full Code [id number]  Lonzell Emeline HERO, DO ED   12/01/2019 1259 12/02/2019 1710 Full Code [id number]  Barrett, Shona MATSU, PA-C ED   10/27/2019 0030 10/28/2019 2035 Full Code [id number]  Martie Gatha ORN, MD Inpatient   10/27/2019 0016 10/27/2019 0029 Full Code [id number]  Wonda Sharper, MD Inpatient   10/27/2019 0016 10/27/2019 0016 Full Code [id number]  Wonda Sharper, MD Inpatient   04/05/2012 2048 04/12/2012 1621 Full Code [id number]  Olevia Josette CROME, RN Inpatient       Prognosis:  < 2 weeks  Discharge Planning: To Be Determined Anticipated in-hospital death versus transfer to residential hospice palliative service to continue to follow along Care plan was discussed with [facility] MD, oncology, urology and RN  Thank you for allowing the Palliative Medicine Team to assist in the care of this patient.  High MDM.      Greater than 50%  of this time was spent counseling and coordinating care related to the above assessment and plan.  Lonia Serve, MD  Please contact Palliative Medicine Team phone at [phone] for questions and concerns.

## 2024-08-08 NOTE — Progress Notes (Signed)
 Subjective/Chief Complaint: No complaints   Objective: Vital signs in last 24 hours: Temp:  [97.5 F (36.4 C)-98.4 F (36.9 C)] 97.8 F (36.6 C) (12/15 0720) Pulse Rate:  [81-137] 82 (12/15 0720) Resp:  [15-21] 18 (12/15 0720) BP: (112-133)/(50-68) 130/54 (12/15 0720) SpO2:  [95 %-99 %] 99 % (12/15 0720) Last BM Date : 08/06/24  Intake/Output from previous day: 12/14 0701 - 12/15 0700 In: 100 [IV Piggyback:100] Out: 550 [Urine:550] Intake/Output this shift: No intake/output data recorded.  General appearance: alert and cooperative Resp: clear to auscultation bilaterally Cardio: regular rate and rhythm GI: soft, mild tenderness  Lab Results:  Recent Labs    08/06/24 0414 08/06/24 1907  WBC 7.6  --   HGB 6.9* 8.2*  HCT 22.0* 26.5*  PLT 232  --    BMET Recent Labs    08/06/24 0414 08/08/24 0343  NA 133* 136  K 4.3 4.8  CL 103 105  CO2 21* 20*  GLUCOSE 120* 113*  BUN 50* 56*  CREATININE 2.00* 2.37*  CALCIUM  7.9* 8.2*   PT/INR No results for input(s): LABPROT, INR in the last 72 hours. ABG No results for input(s): PHART, HCO3 in the last 72 hours.  Invalid input(s): PCO2, PO2  Studies/Results: DG Abd Portable 1V Result Date: 08/06/2024 CLINICAL DATA:  [id number] Nausea AND vomiting [id number] EXAM: PORTABLE ABDOMEN - 1 VIEW COMPARISON:  August 04, 2024 FINDINGS: Air and stool filled nondilated loops of bowel. Enteric contrast delineates predominantly bowel loops within the RIGHT hemicolon and transverse colon. Air is visualized in the rectum. Degenerative changes of the lumbar spine. IMPRESSION: Nonobstructive bowel gas pattern. Known colonic perforation is not visualized radiographically. Electronically Signed   By: Corean Salter M.D.   On: 08/06/2024 19:15   US  RENAL Result Date: 08/06/2024 CLINICAL DATA:  [id number] Hydronephrosis of left kidney [id number] EXAM: RENAL / URINARY TRACT ULTRASOUND COMPLETE COMPARISON:  August 04, 2024 FINDINGS:  Right Kidney: Renal measurements: 10.1 x 4.6 x 4.8 cm = volume: 114 mL. Echogenicity is mildly increased. No hydronephrosis visualized. Benign cyst is noted measuring 14 mm (for which no dedicated imaging follow-up is recommended). Left Kidney: Renal measurements: 11.7 x 5.7 x 4.7 cm = volume: 163 mL. Echogenicity is increased. There is severe hydronephrosis. Bladder: Not visualized secondary to shadowing bowel gas Other: None. IMPRESSION: 1. Severe left hydronephrosis. 2. Increased echogenicity of the kidneys as can be seen in medical renal disease. Electronically Signed   By: Corean Salter M.D.   On: 08/06/2024 18:07    Anti-infectives: Anti-infectives (From admission, onward)    Start     Dose/Rate Route Frequency Ordered Stop   08/06/24 0415  meropenem  (MERREM ) 1 g in sodium chloride  0.9 % 100 mL IVPB        1 g 200 mL/hr over 30 Minutes Intravenous Every 12 hours 08/06/24 0317     08/04/24 2345  metroNIDAZOLE  (FLAGYL ) IVPB 500 mg        500 mg 100 mL/hr over 60 Minutes Intravenous Every 12 hours 08/04/24 2256     08/04/24 1300  cefTRIAXone  (ROCEPHIN ) 2 g in sodium chloride  0.9 % 100 mL IVPB  Status:  Discontinued        2 g 200 mL/hr over 30 Minutes Intravenous Every 24 hours 08/03/24 1220 08/03/24 1236   08/03/24 1300  cefTRIAXone  (ROCEPHIN ) 2 g in sodium chloride  0.9 % 100 mL IVPB  Status:  Discontinued        2 g 200 mL/hr over 30  Minutes Intravenous Every 24 hours 08/03/24 1236 08/06/24 0317   08/03/24 1200  cefTRIAXone  (ROCEPHIN ) 1 g in sodium chloride  0.9 % 100 mL IVPB  Status:  Discontinued        1 g 200 mL/hr over 30 Minutes Intravenous Every 24 hours 08/03/24 1154 08/03/24 1220   08/03/24 1145  nitrofurantoin  (MACRODANTIN ) capsule 100 mg  Status:  Discontinued        100 mg Oral Daily 08/03/24 1143 08/03/24 1154       Assessment/Plan: s/p * No surgery found * Advance diet. Allow fulls Sigmoid diverticulitis. Improving on abx Metastatic lung cancer to brain per  oncology Will follow   LOS: 5 days    Abigail [name] 08/08/2024

## 2024-08-08 NOTE — Telephone Encounter (Signed)
 Called the patient to reschedule lab and follow-up from 12/10. She said she's in the hospital and will call to reschedule when she know what's going on and when she will be out.

## 2024-08-08 NOTE — Progress Notes (Signed)
 PHARMACY - ANTICOAGULATION CONSULT NOTE  Pharmacy Consult for Enoxaparin  Indication: DVT  Allergies[1]  Patient Measurements: Height: 5' 6 (167.6 cm) Weight: 71.5 kg (157 lb 10.1 oz) IBW/kg (Calculated) : 59.3 HEPARIN  DW (KG): 71.5  Vital Signs: Temp: 97.8 F (36.6 C) (12/15 1905) Temp Source: Oral (12/15 1905) BP: 143/56 (12/15 1905) Pulse Rate: 73 (12/15 1905)  Labs: Recent Labs    08/06/24 0414 08/06/24 1907 08/08/24 0343 08/08/24 1132  HGB 6.9* 8.2*  --  8.0*  HCT 22.0* 26.5*  --  26.7*  PLT 232  --   --  239  CREATININE 2.00*  --  2.37*  --     Estimated Creatinine Clearance: 17.3 mL/min (A) (by C-G formula based on SCr of 2.37 mg/dL (H)).   Medical History: Past Medical History:  Diagnosis Date   Anemia    Arthritis    knees, back   Bacteremia 04/08/2012   CAP (community acquired pneumonia) 04/12/2012   Carotid artery occlusion    Chronic kidney disease    stage 3 ckd no nephrologist   Complete uterine prolapse with prolapse of anterior vaginal wall    Complication of anesthesia    hard to wake up per pt   Constipation    Coronary artery disease    Diverticulitis yrs ago coialitis   Dyspnea    History of blood transfusion    History of radiation therapy    right lung 08/05/2021-09/18/2021  Dr Lynwood Nasuti   Hypertension    Hypothyroid    LLQ abdominal pain 04/05/2012   Lung cancer (HCC)    Numbness    in hands at times   Pneumonia    Pre-diabetes    Scoliosis    STEMI (ST elevation myocardial infarction) (HCC) 10/26/2019   DES RCA   Wears dentures    full dentures   Wears glasses    for reading    Medications:  Scheduled:   atorvastatin   80 mg Oral Daily   budesonide -glycopyrrolate -formoterol   2 puff Inhalation BID   Chlorhexidine  Gluconate Cloth  6 each Topical Daily   ferrous sulfate   325 mg Oral Q breakfast   levothyroxine   175 mcg Oral Once per day on Monday Tuesday Wednesday Thursday   pantoprazole   40 mg Oral Daily    polyethylene glycol  17 g Oral Daily   Ensure Max Protein  11 oz Oral BID BM   Infusions:   meropenem  (MERREM ) IV 1 g (08/08/24 1624)    Assessment: Pharmacy is consulted to dose Lovenox  on 12/15 for DVT.  Dr Delorse Kipper NP recommend for Dr. Sherrod to review before starting anticoagulation. Note SCr 2.37 and increasing.  CrCl < 30 ml/min CBC: Hgb up to 8 (transfused PRBC on 12/10 and 12/14).  Plt WNL  Goal of Therapy:  Anti-Xa level 0.6-1 units/ml 4hrs after LMWH dose given Monitor platelets by anticoagulation protocol: Yes   Plan:  No anticoagulation until further review per rounding team on 12/15 PM. Pharmacy to follow up anticoagulation plans on 12/16.   Wanda Hasting PharmD, BCPS [facility] main pharmacy [phone] 08/08/2024 8:08 PM       [1]  Allergies Allergen Reactions   Codeine Nausea And Vomiting   Norvasc  [Amlodipine ] Rash

## 2024-08-08 NOTE — Progress Notes (Signed)
 Subjective: First time meeting Abigail [name].  She was not responsive.  Long conversation this morning with her son [name], and their trusted family friend Heron, who was a Garment/textile Technologist for 40 years.  Objective: Vital signs in last 24 hours: Temp:  [97.5 F (36.4 C)-98.5 F (36.9 C)] 98.5 F (36.9 C) (12/15 1054) Pulse Rate:  [80-137] 80 (12/15 1054) Resp:  [15-21] 18 (12/15 0720) BP: (112-133)/(35-68) 118/35 (12/15 1054) SpO2:  [95 %-99 %] 99 % (12/15 1054)  Assessment/Plan: # Left hydronephrosis #AoCKD  Left hydroureteronephrosis extending down to the level of a contained abscess/perforation of diverticulitis.  Patient presently continuing conservatively with antibiotics.  Interval worsening of her renal function overnight.  I discussed with her son [name] that a stent placement would be a relatively simple procedure, but the larger question was surrounding her multiple serious comorbidities and ultimate goals of care.  Since these had yet to be determined, we discussed her problem list and he expressed that he had watched her decompensate significantly over the last couple of years and did not feel that she had any quality of life, as much as she was just surviving.  He is amenable to deferring further curative interventions and discussing moving to comfort measures.  Palliative care consult already in place.  He has asked that we include their trusted family friend Heron, who was a Teacher, Early Years/pre. nurse for about 40 years.   Urology will defer further intervention.  Please feel free to contact us  with questions, concerns, or changes in her goals of care.  I am happy to help contextualize the urologic portion of her larger prognosis.  Intake/Output from previous day: 12/14 0701 - 12/15 0700 In: 100 [IV Piggyback:100] Out: 550 [Urine:550]  Intake/Output this shift: No intake/output data recorded.  Physical Exam:  General: Decompensated, chronically ill, unresponsive CV: No  cyanosis Lungs: equal chest rise   Lab Results: Recent Labs    08/06/24 0414 08/06/24 1907  HGB 6.9* 8.2*  HCT 22.0* 26.5*   BMET Recent Labs    08/06/24 0414 08/06/24 1907 08/08/24 0343  NA 133*  --  136  K 4.3  --  4.8  CL 103  --  105  CO2 21*  --  20*  GLUCOSE 120*  --  113*  BUN 50*  --  56*  CREATININE 2.00*  --  2.37*  CALCIUM  7.9*  --  8.2*  HGB 6.9* 8.2*  --   WBC 7.6  --   --      Studies/Results: DG Abd Portable 1V Result Date: 08/06/2024 CLINICAL DATA:  [id number] Nausea AND vomiting [id number] EXAM: PORTABLE ABDOMEN - 1 VIEW COMPARISON:  August 04, 2024 FINDINGS: Air and stool filled nondilated loops of bowel. Enteric contrast delineates predominantly bowel loops within the RIGHT hemicolon and transverse colon. Air is visualized in the rectum. Degenerative changes of the lumbar spine. IMPRESSION: Nonobstructive bowel gas pattern. Known colonic perforation is not visualized radiographically. Electronically Signed   By: Corean Salter M.D.   On: 08/06/2024 19:15   US  RENAL Result Date: 08/06/2024 CLINICAL DATA:  [id number] Hydronephrosis of left kidney [id number] EXAM: RENAL / URINARY TRACT ULTRASOUND COMPLETE COMPARISON:  August 04, 2024 FINDINGS: Right Kidney: Renal measurements: 10.1 x 4.6 x 4.8 cm = volume: 114 mL. Echogenicity is mildly increased. No hydronephrosis visualized. Benign cyst is noted measuring 14 mm (for which no dedicated imaging follow-up is recommended). Left Kidney: Renal measurements: 11.7 x 5.7 x 4.7 cm =  volume: 163 mL. Echogenicity is increased. There is severe hydronephrosis. Bladder: Not visualized secondary to shadowing bowel gas Other: None. IMPRESSION: 1. Severe left hydronephrosis. 2. Increased echogenicity of the kidneys as can be seen in medical renal disease. Electronically Signed   By: Corean Salter M.D.   On: 08/06/2024 18:07      LOS: 5 days   Ole Bourdon, NP [facility] Pager: [phone]  08/08/2024, 11:04 AM

## 2024-08-08 NOTE — Progress Notes (Addendum)
 PROGRESS NOTE  HUYEN PERAZZO  FMW:[id number] DOB: [birth year] DOA: 08/03/2024 PCP: [name], FNP   Brief Narrative: Patient is an 88 year old female with medical history significant of metastatic non-small lung cancer to brain, currently on radiation treatment, chronic hypoxic respiratory failure on 3 L of oxygen  per minute, CKD stage IIIb, 2 skin cancers on her legs s/p resection, hypertension, coronary artery disease, COPD on 3 L of oxygen  per minute, chronic normocytic anemia and hypothyroidism.  Patient was admitted with generalized weakness and severe anemia, hemoglobin of 6.7 g/dL.  No serum creatinine was also noted.  Patient has been transfused 2 units of packed red blood cells.  Weakness is thought to be multifactorial.  Workup revealed Pseudomonas UTI and severe diverticular disease with perforation.  Patient is currently on IV meropenem .  Flagyl  was discontinued earlier today.  GI studies also revealed severe left-sided hydronephrosis and hydroureter.  Doppler ultrasound of the left lower extremity revealed extensive DVT.  General surgery, urology and palliative care team consulted.    08/08/2024: Patient seen.  Palliative care input is appreciated.  Patient has elected to remain full code.    Assessment & Plan:  Principal Problem:   AKI (acute kidney injury) Active Problems:   Hyperlipidemia   Hypothyroidism   CKD (chronic kidney disease), stage III (HCC)   Essential hypertension   Coronary artery disease of native artery of native heart with stable angina pectoris   Malignant neoplasm of unspecified part of unspecified bronchus or lung (HCC)   COPD (chronic obstructive pulmonary disease) (HCC)   Symptomatic anemia   Chronic iron  deficiency anemia   Weakness   Urinary tract infection   Non-small cell lung cancer (HCC)   AKI on CKD stage IIIb: - Baseline creatinine around 1.6. - Admitted with serum creatinine of 2.2 -Poor oral intake prior to presentation. - Last serum  creatinine was 2.  Repeat renal panel in the morning. - IV albumin  25 g x 1 dose.  Albumin  is 2.4.  Leg edema is noted. -Renal ultrasound revealed severe left-sided hydronephrosis. - Urology input is appreciated.  Patient has yet to decided to proceed with further management of the severe left-sided hydronephrosis.   - Continue to monitor renal function and electrolytes. - Avoid nephrotoxins. - Keep MAP greater than 65 mmHg. 08/08/2024: Urology team may consider proceeding with further management of severe left-sided hydronephrosis and hydroureter.   Metastatic non-small cell lung cancer with solitary cerebral brain metastasis: -Follows with Dr. Sherrod.  Completed concurrent chemoradiation and 1 year of Durvalumab  consolidation therapy.  Currently on stereotactic radiosurgery for brain mets.  She completed the course of Decadron .  Follows with radiation oncology.   oncology/Dr. Sherrod consulted 08/08/2024: Oncology team is directing care.   Chronic hypoxic respiratory failure/COPD:  -Currently not on exacerbation.  On 3 L of oxygen  chronically at home.  Continue bronchodilators, inhalers.   -Not in acute exacerbation at the moment.   Acute on chronic normocytic anemia:  -Baseline hemoglobin around 7-8. - Admitted with hemoglobin of 6.8 g/dL.  Likely associated with malignancy.  No evidence of acute blood loss.   -Given a unit of PRBC.   - Hemoglobin of 7.1 g/dL today. - Continue to monitor closely.  -Iron  panel reveals iron  of 10, TSAT of 5, TIBC of 227. -Likely anemia of chronic inflammation.  Patient follows up with oncology team. -Continue ferrous sulfate . 08/08/2024: Continue to monitor.  Transfuse packed red blood cells as deemed necessary.   Hypertension: - Blood pressure is controlled.   -  Takes metoprolol  at home.  Currently on hold.     History of coronary artery disease/elevated troponin:  -On aspirin , Lipitor , Repatha .  No anginal symptoms.  Takes aspirin  at home,  currently on hold, will resume on discharge if no evidence of GI bleed. Denies any chest pain today.  Elevated troponin is likely from demand ischemia from anemia.   History of skin cancer:  -Underwent resection of the skin tumor on the left leg on October of this year.  Wound care consulted.  Wound appears to be slightly infected with purulent material.  Patient is on IV meropenem .  History of recurrent UTI:  -On chronic therapy with nitrofurantoin . She has  noted frequency, dysuria symptoms.  Also complained of left lower quadrant pain.  Checking CT abdomen/pelvis without contrast.  Started on ceftriaxone .  Urine culture showing 30,000 colonies of Pseudomonas along with mixed organisms. 08/08/2024: Continue IV meropenem .   Debility/deconditioning/generalized weakness:  -Patient lives with son.But on son is at at work all time time.  Will consult PT.  Ambulates with rollator.  Son is agreeable for SNF if needed 12/25: Further PT/OT recommendation 08/08/2024: Comfort directed care.  Diffuse diverticular disease of the colon/diverticulitis of sigmoid colon with colon mass not excluded/suspicious focal/contained perforation and abscess: - Continue antibiotics (meropenem  and Flagyl ). 08/08/2024: Continue IV meropenem ..  Extensive DVT of left lower extremity: - Discussed with vascular surgery team. - Further care will depend on goals of care. - Options include subcutaneous Lovenox  versus IVC filter placement. - Hopefully, oncology/hematology team will advise us . - Will await input from Dr. Sherrod, patient's oncologist..   Goals of care: Patient has elected to be full code.      DVT prophylaxis:SCDs Start: 08/03/24 1141     Code Status: Do not attempt resuscitation (DNR) - Comfort care  Family Communication:   Patient status: Inpatient  Patient is from : Home  Anticipated discharge to: Home versus SNF  Estimated DC date: 2-3 days   Consultants: -Oncology - General  Surgery - Urology - Palliative care  Procedures: None  Antimicrobials:  Anti-infectives (From admission, onward)    Start     Dose/Rate Route Frequency Ordered Stop   08/06/24 0415  meropenem  (MERREM ) 1 g in sodium chloride  0.9 % 100 mL IVPB        1 g 200 mL/hr over 30 Minutes Intravenous Every 12 hours 08/06/24 0317     08/04/24 2345  metroNIDAZOLE  (FLAGYL ) IVPB 500 mg  Status:  Discontinued        500 mg 100 mL/hr over 60 Minutes Intravenous Every 12 hours 08/04/24 2256 08/08/24 1224   08/04/24 1300  cefTRIAXone  (ROCEPHIN ) 2 g in sodium chloride  0.9 % 100 mL IVPB  Status:  Discontinued        2 g 200 mL/hr over 30 Minutes Intravenous Every 24 hours 08/03/24 1220 08/03/24 1236   08/03/24 1300  cefTRIAXone  (ROCEPHIN ) 2 g in sodium chloride  0.9 % 100 mL IVPB  Status:  Discontinued        2 g 200 mL/hr over 30 Minutes Intravenous Every 24 hours 08/03/24 1236 08/06/24 0317   08/03/24 1200  cefTRIAXone  (ROCEPHIN ) 1 g in sodium chloride  0.9 % 100 mL IVPB  Status:  Discontinued        1 g 200 mL/hr over 30 Minutes Intravenous Every 24 hours 08/03/24 1154 08/03/24 1220   08/03/24 1145  nitrofurantoin  (MACRODANTIN ) capsule 100 mg  Status:  Discontinued        100 mg Oral  Daily 08/03/24 1143 08/03/24 1154       Subjective: Patient seen and examined No new changes.    Objective: Vitals:   08/08/24 0720 08/08/24 1054 08/08/24 1524 08/08/24 1905  BP: (!) 130/54 (!) 118/35 (!) 103/47 (!) 143/56  Pulse: 82 80 74 73  Resp: 18     Temp: 97.8 F (36.6 C) 98.5 F (36.9 C) 98.2 F (36.8 C) 97.8 F (36.6 C)  TempSrc: Oral Oral Oral Oral  SpO2: 99% 99% 97% 99%  Weight:      Height:        Intake/Output Summary (Last 24 hours) at 08/08/2024 1935 Last data filed at 08/08/2024 1920 Gross per 24 hour  Intake 200 ml  Output 300 ml  Net -100 ml   Filed Weights   08/03/24 0908 08/03/24 1359  Weight: 70.8 kg 71.5 kg    Examination:  General exam: Chronically ill looking.    Respiratory system: Clear to auscultation. Cardiovascular system: S1 & S2 heard, RRR.  Gastrointestinal system: Abdomen is obese, soft and nontender. Central nervous system: Awake and alert.     Data Reviewed: I have personally reviewed following labs and imaging studies  CBC: Recent Labs  Lab 08/03/24 0920 08/04/24 0420 08/05/24 0120 08/06/24 0414 08/06/24 1907 08/08/24 1132  WBC 8.8 7.4 8.1 7.6  --  7.9  NEUTROABS 7.8*  --   --  6.7  --  6.1  HGB 6.8* 7.3* 7.1* 6.9* 8.2* 8.0*  HCT 22.7* 23.5* 22.9* 22.0* 26.5* 26.7*  MCV 90.1 87.7 89.1 89.1  --  93.0  PLT 276 240 243 232  --  239   Basic Metabolic Panel: Recent Labs  Lab 08/03/24 0920 08/04/24 0420 08/05/24 0120 08/06/24 0414 08/08/24 0343  NA 136 136 134* 133* 136  K 4.3 4.0 4.0 4.3 4.8  CL 99 104 102 103 105  CO2 24 21* 21* 21* 20*  GLUCOSE 148* 125* 130* 120* 113*  BUN 53* 50* 47* 50* 56*  CREATININE 2.20* 1.96* 1.97* 2.00* 2.37*  CALCIUM  8.7* 7.8* 7.8* 7.9* 8.2*  MG  --   --  2.0 1.9 2.0  PHOS  --   --  3.3 3.2 3.7     Recent Results (from the past 240 hours)  Resp panel by RT-PCR (RSV, Flu A&B, Covid) Anterior Nasal Swab     Status: None   Collection Time: 08/03/24  9:21 AM   Specimen: Anterior Nasal Swab  Result Value Ref Range Status   SARS Coronavirus 2 by RT PCR NEGATIVE NEGATIVE Final    Comment: (NOTE) SARS-CoV-2 target nucleic acids are NOT DETECTED.  The SARS-CoV-2 RNA is generally detectable in upper respiratory specimens during the acute phase of infection. The lowest concentration of SARS-CoV-2 viral copies this assay can detect is 138 copies/mL. A negative result does not preclude SARS-Cov-2 infection and should not be used as the sole basis for treatment or other patient management decisions. A negative result may occur with  improper specimen collection/handling, submission of specimen other than nasopharyngeal swab, presence of viral mutation(s) within the areas targeted by this  assay, and inadequate number of viral copies(<138 copies/mL). A negative result must be combined with clinical observations, patient history, and epidemiological information. The expected result is Negative.  Fact Sheet for Patients:  bloggercourse.com  Fact Sheet for Healthcare Providers:  seriousbroker.it  This test is no t yet approved or cleared by the [place]  FDA and  has been authorized for detection and/or diagnosis of SARS-CoV-2  by FDA under an Emergency Use Authorization (EUA). This EUA will remain  in effect (meaning this test can be used) for the duration of the COVID-19 declaration under Section 564(b)(1) of the Act, 21 U.S.C.section 360bbb-3(b)(1), unless the authorization is terminated  or revoked sooner.       Influenza A by PCR NEGATIVE NEGATIVE Final   Influenza B by PCR NEGATIVE NEGATIVE Final    Comment: (NOTE) The Xpert Xpress SARS-CoV-2/FLU/RSV plus assay is intended as an aid in the diagnosis of influenza from Nasopharyngeal swab specimens and should not be used as a sole basis for treatment. Nasal washings and aspirates are unacceptable for Xpert Xpress SARS-CoV-2/FLU/RSV testing.  Fact Sheet for Patients: bloggercourse.com  Fact Sheet for Healthcare Providers: seriousbroker.it  This test is not yet approved or cleared by the [place]  FDA and has been authorized for detection and/or diagnosis of SARS-CoV-2 by FDA under an Emergency Use Authorization (EUA). This EUA will remain in effect (meaning this test can be used) for the duration of the COVID-19 declaration under Section 564(b)(1) of the Act, 21 U.S.C. section 360bbb-3(b)(1), unless the authorization is terminated or revoked.     Resp Syncytial Virus by PCR NEGATIVE NEGATIVE Final    Comment: (NOTE) Fact Sheet for Patients: bloggercourse.com  Fact Sheet for  Healthcare Providers: seriousbroker.it  This test is not yet approved or cleared by the [place]  FDA and has been authorized for detection and/or diagnosis of SARS-CoV-2 by FDA under an Emergency Use Authorization (EUA). This EUA will remain in effect (meaning this test can be used) for the duration of the COVID-19 declaration under Section 564(b)(1) of the Act, 21 U.S.C. section 360bbb-3(b)(1), unless the authorization is terminated or revoked.  Performed at [facility], [address] [place], [place] [address]   Urine Culture (for pregnant, neutropenic or urologic patients or patients with an indwelling urinary catheter)     Status: Abnormal   Collection Time: 08/03/24 12:28 PM   Specimen: Urine, Clean Catch  Result Value Ref Range Status   Specimen Description   Final    URINE, CLEAN CATCH Performed at [facility], [address], [place], [place] [address]    Special Requests   Final    NONE Performed at [facility], [address], [place], [place] [address]    Culture (A)  Final    30,000 COLONIES/mL PSEUDOMONAS AERUGINOSA WITHIN MIXED ORGANISMS Performed at [facility], [address] [place], [place] [address]    Report Status 08/05/2024 FINAL  Final   Organism ID, Bacteria PSEUDOMONAS AERUGINOSA (A)  Final      Susceptibility   Pseudomonas aeruginosa - MIC*    MEROPENEM  <=0.25 SENSITIVE Sensitive     CIPROFLOXACIN  >=4 RESISTANT Resistant     IMIPENEM 2 SENSITIVE Sensitive     PIP/TAZO Value in next row Sensitive      16 SENSITIVEThis is a modified FDA-approved test that has been validated and its performance characteristics determined by the reporting laboratory.  This laboratory is certified under the Clinical Laboratory Improvement Amendments CLIA as qualified to perform high complexity clinical laboratory testing.    CEFTAZIDIME /AVIBACTAM Value in next row Sensitive       16 SENSITIVEThis is a modified FDA-approved test that has been validated and its performance characteristics determined by the reporting laboratory.  This laboratory is certified under the Clinical Laboratory Improvement Amendments CLIA as qualified to perform high complexity clinical laboratory testing.  CEFTOLOZANE/TAZOBACTAM Value in next row Sensitive      16 SENSITIVEThis is a modified FDA-approved test that has been validated and its performance characteristics determined by the reporting laboratory.  This laboratory is certified under the Clinical Laboratory Improvement Amendments CLIA as qualified to perform high complexity clinical laboratory testing.    TOBRAMYCIN Value in next row Sensitive      16 SENSITIVEThis is a modified FDA-approved test that has been validated and its performance characteristics determined by the reporting laboratory.  This laboratory is certified under the Clinical Laboratory Improvement Amendments CLIA as qualified to perform high complexity clinical laboratory testing.    CEFTAZIDIME  Value in next row Sensitive      16 SENSITIVEThis is a modified FDA-approved test that has been validated and its performance characteristics determined by the reporting laboratory.  This laboratory is certified under the Clinical Laboratory Improvement Amendments CLIA as qualified to perform high complexity clinical laboratory testing.    * 30,000 COLONIES/mL PSEUDOMONAS AERUGINOSA     Radiology Studies: VAS US  LOWER EXTREMITY VENOUS (DVT) Result Date: 08/08/2024  Lower Venous DVT Study Patient Name:  TAUNYA GORAL  Date of Exam:   08/08/2024 Medical Rec #: [id number]     Accession #:    [id number] Date of Birth: 1936/03/19     Patient Gender: F Patient Age:   66 years Exam Location:  [facility] Procedure:      VAS US  LOWER EXTREMITY VENOUS (DVT) Referring Phys: LEATRICE Corynne Scibilia --------------------------------------------------------------------------------  Indications:  Edema. Other Indications: Severe left-sided hydroureteronephrosis. Risk Factors: Cancer Metastatic non-small lung cancer to brain, on radiation. Recent surgery for skin cancer on left calf. Limitations: Poor ultrasound/tissue interface and Edema, bowel gas. Comparison Study: No prior study on file Performing Technologist: Alberta Lis RVS  Examination Guidelines: A complete evaluation includes B-mode imaging, spectral Doppler, color Doppler, and power Doppler as needed of all accessible portions of each vessel. Bilateral testing is considered an integral part of a complete examination. Limited examinations for reoccurring indications may be performed as noted. The reflux portion of the exam is performed with the patient in reverse Trendelenburg.  +---------+---------------+---------+-----------+----------+-----------------+ RIGHT    CompressibilityPhasicitySpontaneityPropertiesThrombus Aging    +---------+---------------+---------+-----------+----------+-----------------+ CFV      Full           Yes      Yes                                    +---------+---------------+---------+-----------+----------+-----------------+ SFJ      Full                                                           +---------+---------------+---------+-----------+----------+-----------------+ FV Prox  Full           Yes      Yes                                    +---------+---------------+---------+-----------+----------+-----------------+ FV Mid   Full                                                           +---------+---------------+---------+-----------+----------+-----------------+  FV DistalFull                                                           +---------+---------------+---------+-----------+----------+-----------------+ PFV      Full           Yes      Yes                                    +---------+---------------+---------+-----------+----------+-----------------+ POP       None           No       No                   Age Indeterminate +---------+---------------+---------+-----------+----------+-----------------+ PTV      None                                         Age Indeterminate +---------+---------------+---------+-----------+----------+-----------------+ PERO     None                                         Age Indeterminate +---------+---------------+---------+-----------+----------+-----------------+   +---------+---------------+---------+-----------+----------+-------------------+ LEFT     CompressibilityPhasicitySpontaneityPropertiesThrombus Aging      +---------+---------------+---------+-----------+----------+-------------------+ CFV      None           No       No                   Acute               +---------+---------------+---------+-----------+----------+-------------------+ SFJ      None           No       No                   Acute               +---------+---------------+---------+-----------+----------+-------------------+ FV Prox  None           No       No                   Acute               +---------+---------------+---------+-----------+----------+-------------------+ FV Mid   None                                         Acute               +---------+---------------+---------+-----------+----------+-------------------+ FV DistalNone                                         Acute               +---------+---------------+---------+-----------+----------+-------------------+ PFV      None  Acute               +---------+---------------+---------+-----------+----------+-------------------+ POP      None           No       No                   Acute               +---------+---------------+---------+-----------+----------+-------------------+ PTV      None                                         Acute                +---------+---------------+---------+-----------+----------+-------------------+ PERO     None                                         Acute               +---------+---------------+---------+-----------+----------+-------------------+ Soleal   None                                         Acute               +---------+---------------+---------+-----------+----------+-------------------+ Gastroc  None                                         Acute               +---------+---------------+---------+-----------+----------+-------------------+ GSV      None                                         Acute in proximal                                                         portion             +---------+---------------+---------+-----------+----------+-------------------+ EIV                                                   Acute               +---------+---------------+---------+-----------+----------+-------------------+ IVC                                                   patent              +---------+---------------+---------+-----------+----------+-------------------+ Unable to visualized the common iliac veins secondary to bowel gas    Summary: RIGHT: - Findings consistent with age indeterminate deep vein thrombosis involving the  right popliteal vein, right posterior tibial veins, and right peroneal veins.   LEFT: - Findings consistent with acute deep vein thrombosis involving the left common femoral vein, SF junction, left femoral vein, left proximal profunda vein, left popliteal vein, left posterior tibial veins, left peroneal veins, and External iliac vein. - Findings consistent with acute superficial vein thrombosis involving the left great saphenous vein. Findings consistent with acute intramuscular thrombosis involving the left gastrocnemius veins, and left soleal veins.  *See table(s) above for measurements and observations. Electronically signed by Fonda Rim on 08/08/2024 at 5:50:01 PM.    Final     Scheduled Meds:  atorvastatin   80 mg Oral Daily   budesonide -glycopyrrolate -formoterol   2 puff Inhalation BID   Chlorhexidine  Gluconate Cloth  6 each Topical Daily   ferrous sulfate   325 mg Oral Q breakfast   levothyroxine   175 mcg Oral Once per day on Monday Tuesday Wednesday Thursday   pantoprazole   40 mg Oral Daily   polyethylene glycol  17 g Oral Daily   Ensure Max Protein  11 oz Oral BID BM   Continuous Infusions:  meropenem  (MERREM ) IV 1 g (08/08/24 1624)     LOS: 5 days   Time spent: 55 minutes.  Leatrice LILLETTE Chapel, MD [facility] P12/15/2025, 7:35 PMINR 10,  iron  panel reveals iron  of 10, iron  panel reveals

## 2024-08-08 NOTE — Progress Notes (Signed)
 PHYSICAL THERAPY  Unable to arouse Pt to participate, brief opening eyes.  Nursing reports Pt is NPO for a procedure.  Per chart review later in day, Pt moving to comfort measures.  Will update LPT.  Katheryn Leap  PTA Acute  Rehabilitation Services Office M-F          [phone]

## 2024-08-08 NOTE — Progress Notes (Signed)
°   08/08/24 0104  Assess: MEWS Score  Temp 98.4 F (36.9 C)  BP 133/66  MAP (mmHg) 84  Pulse Rate (!) 137  Resp 17  SpO2 99 %  O2 Device Nasal Cannula  O2 Flow Rate (L/min) 3 L/min  Assess: MEWS Score  MEWS Temp 0  MEWS Systolic 0  MEWS Pulse 3  MEWS RR 0  MEWS LOC 0  MEWS Score 3  MEWS Score Color Yellow  Assess: if the MEWS score is Yellow or Red  Were vital signs accurate and taken at a resting state? Yes  Does the patient meet 2 or more of the SIRS criteria? No  MEWS guidelines implemented  Yes, yellow  Treat  MEWS Interventions Considered administering scheduled or prn medications/treatments as ordered  Take Vital Signs  Increase Vital Sign Frequency  Yellow: Q2hr x1, continue Q4hrs until patient remains green for 12hrs  Escalate  MEWS: Escalate Yellow: Discuss with charge nurse and consider notifying provider and/or RRT  Notify: Charge Nurse/RN  Name of Charge Nurse/RN Notified Tinnie Bud, RN  Provider Notification  Provider Name/Title Lynwood Sieving, NP  Date Provider Notified 08/08/24  Time Provider Notified 0111  Method of Notification  (secure chat)  Notification Reason Change in status  Provider response See new orders  Date of Provider Response 08/08/24  Time of Provider Response 0118  Assess: SIRS CRITERIA  SIRS Temperature  0  SIRS Respirations  0  SIRS Pulse 1  SIRS WBC 0  SIRS Score Sum  1   CCMD called regarding  afib rhythm. On-call provider notified. EKG was obtained and showed Afib w/ RVR. New order(Metoprolol ) received and administered per order. Will continue to monitor.

## 2024-08-08 NOTE — Progress Notes (Addendum)
° °      Code Status/Type confirmation of interventions  NAME: Abigail [name] MRN: [id number] DOB : 1935/11/02    Date of Service   08/08/2024   HPI/Events of Note   Notified by Dr. Jeryl for concern over patient change of mental status, patient is now awake and oriented.  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  Admitted through the ER for generalized weakness severe anemia, hemoglobin of 6.7.  Patient had been made comfort care at some point during this Admission and [facility] was notified by Dr. Jeryl that this may need to be changed as of this evening (currently).  Bedside visit Patient is awake and oriented readily tracks me on arriving to the room and addresses me appropriately when I say hello.  Patient is able to answer orientation questions as to name place, time of year, her physician name( she recalls Dr. Towanda name) Also able to recall Dr. Nanda name  Patient is able to recount all of the episode prior to admission as he has noted in the H&P and ER notations.  Asking for CODE STATUS she has was following:  Patient has verified the following with elaboration:   CPR  - yes Shock - yes ACLS - Yes Vent - Yes   BiPAP - Yes Vasopressors - Yes  She is able to recall these answers and states that she has had some experience with this and for her healthcare scenarios.  She is able to operate her smart phone without help or prompting. She did converse with a friend upon arrival to the room address them with I have to speak to this Nurse, uh NP, and I will call you back.  Secondary questioning all the above regarding CODE STATUS reviewed  again for second time with the same results without wavering.  Patient will now be full code Patient is aware of the process of how she became, or can become  Comfort,DNR, etc. She can fully reiterate the process and is clear without hesitation.   Dr Jeryl is aware Dr Rosario is aware     Interventions   Plan: Code Change to FULL code          [facility] [place]

## 2024-08-08 NOTE — Progress Notes (Signed)
 VASCULAR LAB    Bilateral lower extremity venous duplex has been performed.  See CV proc for preliminary results.   Jiaire Rosebrook, RVT 08/08/2024, 2:57 PM

## 2024-08-09 DIAGNOSIS — N1330 Unspecified hydronephrosis: Secondary | ICD-10-CM

## 2024-08-09 DIAGNOSIS — I119 Hypertensive heart disease without heart failure: Secondary | ICD-10-CM

## 2024-08-09 DIAGNOSIS — Z515 Encounter for palliative care: Secondary | ICD-10-CM

## 2024-08-09 DIAGNOSIS — Z7189 Other specified counseling: Secondary | ICD-10-CM

## 2024-08-09 DIAGNOSIS — I82501 Chronic embolism and thrombosis of unspecified deep veins of right lower extremity: Secondary | ICD-10-CM | POA: Insufficient documentation

## 2024-08-09 DIAGNOSIS — K5730 Diverticulosis of large intestine without perforation or abscess without bleeding: Secondary | ICD-10-CM

## 2024-08-09 DIAGNOSIS — C7931 Secondary malignant neoplasm of brain: Secondary | ICD-10-CM

## 2024-08-09 DIAGNOSIS — K5720 Diverticulitis of large intestine with perforation and abscess without bleeding: Secondary | ICD-10-CM

## 2024-08-09 DIAGNOSIS — I82402 Acute embolism and thrombosis of unspecified deep veins of left lower extremity: Secondary | ICD-10-CM

## 2024-08-09 LAB — CBC WITH DIFFERENTIAL/PLATELET
Abs Immature Granulocytes: 0.52 K/uL — ABNORMAL HIGH (ref 0.00–0.07)
Basophils Absolute: 0.1 K/uL (ref 0.0–0.1)
Basophils Relative: 1 %
Eosinophils Absolute: 0.1 K/uL (ref 0.0–0.5)
Eosinophils Relative: 2 %
HCT: 27.7 % — ABNORMAL LOW (ref 36.0–46.0)
Hemoglobin: 8.3 g/dL — ABNORMAL LOW (ref 12.0–15.0)
Immature Granulocytes: 8 %
Lymphocytes Relative: 4 %
Lymphs Abs: 0.3 K/uL — ABNORMAL LOW (ref 0.7–4.0)
MCH: 27.6 pg (ref 26.0–34.0)
MCHC: 30 g/dL (ref 30.0–36.0)
MCV: 92 fL (ref 80.0–100.0)
Monocytes Absolute: 0.7 K/uL (ref 0.1–1.0)
Monocytes Relative: 10 %
Neutro Abs: 5.3 K/uL (ref 1.7–7.7)
Neutrophils Relative %: 75 %
Platelets: 249 K/uL (ref 150–400)
RBC: 3.01 MIL/uL — ABNORMAL LOW (ref 3.87–5.11)
RDW: 17.6 % — ABNORMAL HIGH (ref 11.5–15.5)
Smear Review: NORMAL
WBC: 7 K/uL (ref 4.0–10.5)
nRBC: 0 % (ref 0.0–0.2)

## 2024-08-09 LAB — GLUCOSE, CAPILLARY
Glucose-Capillary: 128 mg/dL — ABNORMAL HIGH (ref 70–99)
Glucose-Capillary: 133 mg/dL — ABNORMAL HIGH (ref 70–99)
Glucose-Capillary: 136 mg/dL — ABNORMAL HIGH (ref 70–99)
Glucose-Capillary: 165 mg/dL — ABNORMAL HIGH (ref 70–99)
Glucose-Capillary: 197 mg/dL — ABNORMAL HIGH (ref 70–99)

## 2024-08-09 MED ORDER — ENOXAPARIN SODIUM 80 MG/0.8ML IJ SOSY
70.0000 mg | PREFILLED_SYRINGE | INTRAMUSCULAR | Status: DC
  Administered 2024-08-09: 09:00:00 70 mg via SUBCUTANEOUS
  Filled 2024-08-09: qty 0.8

## 2024-08-09 MED ORDER — POLYETHYLENE GLYCOL 3350 17 G PO PACK
17.0000 g | PACK | Freq: Two times a day (BID) | ORAL | Status: DC
  Administered 2024-08-10 – 2024-09-02 (×28): 17 g via ORAL
  Filled 2024-08-09 (×28): qty 1

## 2024-08-09 NOTE — Progress Notes (Signed)
 Daily Progress Note   Patient Name: Abigail [name]       Date: 08/09/2024 DOB: 1936/01/26  Age: 88 y.o. MRN#: [id number] Attending Physician: Patsy Lenis, MD Primary Care Physician: [name], FNP Admit Date: 08/03/2024 Length of Stay: 6 days  Reason for Consultation/Follow-up: Establishing goals of care  Subjective:   Reviewed EMR including recent documentation from hospitalist, surgery team, and urology.  Overnight, patient discussed care with team and noted desire to change CODE STATUS to full code and continue with aggressive medical interventions. Discussed care with hospitalist for medical updates.  Presented to bedside in afternoon to meet with patient.  Urology team present at bedside.  No visitors present with patient.  Urology team discussing possibility of ureteral stent with patient.  Patient was unsure if she wanted to undergo this intervention.  Urology noted plan to obtain labs tomorrow and follow-up regarding consideration of intervention if needed. After urology team left room, able to sit down with patient for extensive time to discuss care planning moving forward.  While patient is more awake and interactive, it was noted during conversation patient could be tangential in thoughts and unable to track conversation.  Patient would revert back to prior conversation without remembering current conversation.  Did try to update patient regarding her medical status in discussions regarding CODE STATUS.  While spending extensive time trying to explain these interventions with patient at her request, patient still notes uncertainty about medical situation and decisions regarding care moving forward.  Patient can state that she wants to be at home to enjoy time with her dogs though also states she does not have the care to assist her at home.  Tried to introduce hospice and its philosophy.  Patient noted she had seen friends with hospice starve to death.  Spent time normalizing  feeling less hunger at end-of-life and supporting quality time with hospice.  Did spend extensive time trying to answer patient's many questions and provide emotional support, including questions regarding CODE STATUS.  While patient stating she does not want to be put on life support, patient unable to consistently state this.  Noted would discussed with care team and continue to have conversations regarding care planning moving forward.  Discussed care with bedside RN and hospitalist after visit.  Patient also had tangential thoughts with hospitalist today.  Concerns about waxing and waning mentation at this time.  Noted would continue conversations as appropriate moving forward.  Hospitalist has discussed with son need to coordinate with his sister regarding care planning and supporting patient's medical care moving forward.  Objective:   Vital Signs:  BP (!) 128/47 (BP Location: Right Arm)   Pulse 73   Temp 98.4 F (36.9 C) (Oral)   Resp 20   Ht 5' 6 (1.676 m)   Wt 71.5 kg   SpO2 97%   BMI 25.44 kg/m   Physical Exam: General: NAD, awake, chronically ill-appearing Cardiovascular: RRR, Respiratory: no increased work of breathing noted, not in respiratory distress Abdomen: not distended Neuro: Awake, interactive, tangential in thoughts at times and difficulty concentrating  Assessment & Plan:   Assessment: Patient is an 88 year old female with a past medical history of metastatic non-small cell lung cancer to brain, COPD with chronic hypoxic respiratory failure on 3 L of nasal cannula oxygen , CKD stage IIIb, 2 skin cancers on her legs s/p resection, hypertension, CAD, hypothyroidism, and chronic normocytic anemia who was admitted on 08/03/2024 for management of weakness and anemia.  During hospitalization patient has received  management for AKI on CKD, extensive DVT of left lower extremity, and diffuse diverticular disease of the colon with concerns for diverticulitis of sigmoid colon  without mass not excluded so concerning for contained perforation and abscess.  Palliative medicine team consulted to assist with complex medical decision making.  Recommendations/Plan: # Complex medical decision making/goals of care:  - Discussed care with patient as detailed above in HPI.  While patient is more awake and interactive, having difficulties focusing on conversation and can be tangential in thoughts at times.  Did spend extensive time explaining patient's underlying medical illnesses and decisions regarding CODE STATUS.  Palliative medicine team will continue to engage in conversations moving forward, particularly following up to determine if patient remembers extensive conversation today to help with assessing capacity regarding medical decision making.  -  Code Status: Full Code  # Psychosocial Support:  GLENWOOD Blades, daughter  # Discharge Planning: To Be Determined  Discussed with: Patient, patient's RN, hospitalist, urology team  Thank you for allowing the palliative care team to participate in the care [name].  Tinnie Radar, DO Palliative Care Provider PMT # [phone]  If patient remains symptomatic despite maximum doses, please call PMT at [phone] between 0700 and 1900. Outside of these hours, please call attending, as PMT does not have night coverage.  Personally spent 50 minutes in patient care including extensive chart review (labs, imaging, progress/consult notes, vital signs), medically appropraite exam, discussed with treatment team, education to patient, family, and staff, documenting clinical information, medication review and management, coordination of care, and available advanced directive documents.

## 2024-08-09 NOTE — Progress Notes (Signed)
 Subjective: Patient states she only has some pain when she moves her bowels, otherwise pain has resolved.  Tolerating FLD with no issues.  ROS: See above, otherwise other systems negative  Objective: Vital signs in last 24 hours: Temp:  [97.8 F (36.6 C)-98.5 F (36.9 C)] 98.4 F (36.9 C) (12/16 0434) Pulse Rate:  [73-80] 73 (12/16 0434) Resp:  [18-20] 20 (12/16 0434) BP: (103-147)/(35-61) 128/47 (12/16 0434) SpO2:  [96 %-100 %] 96 % (12/16 0905) Last BM Date : 08/08/24  Intake/Output from previous day: 12/15 0701 - 12/16 0700 In: 780 [P.O.:480; IV Piggyback:300] Out: 350 [Urine:350] Intake/Output this shift: No intake/output data recorded.  PE: Abd: soft, NT, ND  Lab Results:  Recent Labs    08/08/24 1132 08/09/24 0418  WBC 7.9 7.0  HGB 8.0* 8.3*  HCT 26.7* 27.7*  PLT 239 249   BMET Recent Labs    08/08/24 0343  NA 136  K 4.8  CL 105  CO2 20*  GLUCOSE 113*  BUN 56*  CREATININE 2.37*  CALCIUM  8.2*   PT/INR No results for input(s): LABPROT, INR in the last 72 hours. CMP     Component Value Date/Time   NA 136 08/08/2024 0343   NA 137 02/17/2024 1150   K 4.8 08/08/2024 0343   CL 105 08/08/2024 0343   CO2 20 (L) 08/08/2024 0343   GLUCOSE 113 (H) 08/08/2024 0343   BUN 56 (H) 08/08/2024 0343   BUN 38 (H) 02/17/2024 1150   CREATININE 2.37 (H) 08/08/2024 0343   CREATININE 1.62 (H) 06/20/2024 1059   CREATININE 1.08 04/18/2014 1516   CALCIUM  8.2 (L) 08/08/2024 0343   PROT 6.5 08/03/2024 0920   PROT 7.2 08/24/2023 1158   ALBUMIN  2.8 (L) 08/08/2024 0343   ALBUMIN  4.2 08/24/2023 1158   AST 22 08/03/2024 0920   AST 14 (L) 06/20/2024 1059   ALT 15 08/03/2024 0920   ALT 14 06/20/2024 1059   ALKPHOS 101 08/03/2024 0920   BILITOT 0.3 08/03/2024 0920   BILITOT 0.3 06/20/2024 1059   GFRNONAA 19 (L) 08/08/2024 0343   GFRNONAA 31 (L) 06/20/2024 1059   GFRAA 44 (L) 10/03/2020 1101   Lipase     Component Value Date/Time   LIPASE 29 06/06/2024  1432       Studies/Results: VAS US  LOWER EXTREMITY VENOUS (DVT) Result Date: 08/08/2024  Lower Venous DVT Study Patient Name:  Abigail [name]  Date of Exam:   08/08/2024 Medical Rec #: [id number]     Accession #:    [id number] Date of Birth: Jul 10, 1936     Patient Gender: F Patient Age:   88 years Exam Location:  [facility] Procedure:      VAS US  LOWER EXTREMITY VENOUS (DVT) Referring Phys: LEATRICE OGBATA --------------------------------------------------------------------------------  Indications: Edema. Other Indications: Severe left-sided hydroureteronephrosis. Risk Factors: Cancer Metastatic non-small lung cancer to brain, on radiation. Recent surgery for skin cancer on left calf. Limitations: Poor ultrasound/tissue interface and Edema, bowel gas. Comparison Study: No prior study on file Performing Technologist: Alberta Lis RVS  Examination Guidelines: A complete evaluation includes B-mode imaging, spectral Doppler, color Doppler, and power Doppler as needed of all accessible portions of each vessel. Bilateral testing is considered an integral part of a complete examination. Limited examinations for reoccurring indications may be performed as noted. The reflux portion of the exam is performed with the patient in reverse Trendelenburg.  +---------+---------------+---------+-----------+----------+-----------------+ RIGHT    CompressibilityPhasicitySpontaneityPropertiesThrombus Aging    +---------+---------------+---------+-----------+----------+-----------------+ CFV  Full           Yes      Yes                                    +---------+---------------+---------+-----------+----------+-----------------+ SFJ      Full                                                           +---------+---------------+---------+-----------+----------+-----------------+ FV Prox  Full           Yes      Yes                                     +---------+---------------+---------+-----------+----------+-----------------+ FV Mid   Full                                                           +---------+---------------+---------+-----------+----------+-----------------+ FV DistalFull                                                           +---------+---------------+---------+-----------+----------+-----------------+ PFV      Full           Yes      Yes                                    +---------+---------------+---------+-----------+----------+-----------------+ POP      None           No       No                   Age Indeterminate +---------+---------------+---------+-----------+----------+-----------------+ PTV      None                                         Age Indeterminate +---------+---------------+---------+-----------+----------+-----------------+ PERO     None                                         Age Indeterminate +---------+---------------+---------+-----------+----------+-----------------+   +---------+---------------+---------+-----------+----------+-------------------+ LEFT     CompressibilityPhasicitySpontaneityPropertiesThrombus Aging      +---------+---------------+---------+-----------+----------+-------------------+ CFV      None           No       No                   Acute               +---------+---------------+---------+-----------+----------+-------------------+ SFJ      None  No       No                   Acute               +---------+---------------+---------+-----------+----------+-------------------+ FV Prox  None           No       No                   Acute               +---------+---------------+---------+-----------+----------+-------------------+ FV Mid   None                                         Acute               +---------+---------------+---------+-----------+----------+-------------------+ FV DistalNone                                          Acute               +---------+---------------+---------+-----------+----------+-------------------+ PFV      None                                         Acute               +---------+---------------+---------+-----------+----------+-------------------+ POP      None           No       No                   Acute               +---------+---------------+---------+-----------+----------+-------------------+ PTV      None                                         Acute               +---------+---------------+---------+-----------+----------+-------------------+ PERO     None                                         Acute               +---------+---------------+---------+-----------+----------+-------------------+ Soleal   None                                         Acute               +---------+---------------+---------+-----------+----------+-------------------+ Gastroc  None                                         Acute               +---------+---------------+---------+-----------+----------+-------------------+ GSV      None  Acute in proximal                                                         portion             +---------+---------------+---------+-----------+----------+-------------------+ EIV                                                   Acute               +---------+---------------+---------+-----------+----------+-------------------+ IVC                                                   patent              +---------+---------------+---------+-----------+----------+-------------------+ Unable to visualized the common iliac veins secondary to bowel gas    Summary: RIGHT: - Findings consistent with age indeterminate deep vein thrombosis involving the right popliteal vein, right posterior tibial veins, and right peroneal veins.   LEFT: - Findings consistent with  acute deep vein thrombosis involving the left common femoral vein, SF junction, left femoral vein, left proximal profunda vein, left popliteal vein, left posterior tibial veins, left peroneal veins, and External iliac vein. - Findings consistent with acute superficial vein thrombosis involving the left great saphenous vein. Findings consistent with acute intramuscular thrombosis involving the left gastrocnemius veins, and left soleal veins.  *See table(s) above for measurements and observations. Electronically signed by Fonda Rim on 08/08/2024 at 5:50:01 PM.    Final     Anti-infectives: Anti-infectives (From admission, onward)    Start     Dose/Rate Route Frequency Ordered Stop   08/06/24 0415  meropenem  (MERREM ) 1 g in sodium chloride  0.9 % 100 mL IVPB        1 g 200 mL/hr over 30 Minutes Intravenous Every 12 hours 08/06/24 0317     08/04/24 2345  metroNIDAZOLE  (FLAGYL ) IVPB 500 mg  Status:  Discontinued        500 mg 100 mL/hr over 60 Minutes Intravenous Every 12 hours 08/04/24 2256 08/08/24 1224   08/04/24 1300  cefTRIAXone  (ROCEPHIN ) 2 g in sodium chloride  0.9 % 100 mL IVPB  Status:  Discontinued        2 g 200 mL/hr over 30 Minutes Intravenous Every 24 hours 08/03/24 1220 08/03/24 1236   08/03/24 1300  cefTRIAXone  (ROCEPHIN ) 2 g in sodium chloride  0.9 % 100 mL IVPB  Status:  Discontinued        2 g 200 mL/hr over 30 Minutes Intravenous Every 24 hours 08/03/24 1236 08/06/24 0317   08/03/24 1200  cefTRIAXone  (ROCEPHIN ) 1 g in sodium chloride  0.9 % 100 mL IVPB  Status:  Discontinued        1 g 200 mL/hr over 30 Minutes Intravenous Every 24 hours 08/03/24 1154 08/03/24 1220   08/03/24 1145  nitrofurantoin  (MACRODANTIN ) capsule 100 mg  Status:  Discontinued        100 mg Oral Daily 08/03/24 1143 08/03/24 1154        Assessment/Plan Presumed  diverticulitis with abscess, mass not excluded  -Possibility of mass in this region. No recent colonoscopy and dx of metastatic lung cancer.  No overt signs of mass, but unable to exclude.  -tolerating FLD with no issues -adv to soft diet -would treat for a total of 10-14 days of abx therapy due to fluid collection.  Can be transitioned to oral from our standpoint -will defer the need for colonoscopy to oncology given her current situation.  Would she need this vs PET scan, etc.   -no surgical intervention needed.  We will sign off at this time but are available should the need arise. -d/w primary service  FEN - soft VTE - lovenox  ID - merrem   Non-small cell lung cancer with mets to brain HTN Anemia Chronic hypoxic respiratory failure on 3L O2 at home Hypothyroidism Recent falls/weak CKD CAD  I reviewed hospitalist notes, last 24 h vitals and pain scores, last 48 h intake and output, last 24 h labs and trends, and last 24 h imaging results.   LOS: 6 days    Burnard FORBES Banter , [facility] 08/09/2024, 10:52 AM Please see Amion for pager number during day hours 7:00am-4:30pm or 7:00am -11:30am on weekends

## 2024-08-09 NOTE — Hospital Course (Addendum)
 [name] is an 88 year old female with PMH metastatic non-small lung cancer with mets to brain, currently on radiation treatment, chronic hypoxic respiratory failure on 3 L O2, CKD stage IIIb, 2 skin cancers on her legs s/p resection, HTN, CAD, COPD, chronic normocytic anemia and hypothyroidism.   Patient was admitted with generalized weakness and severe anemia, hemoglobin of 6.7 g/dL.  Patient has been transfused 2 units of packed red blood cells.  Weakness is thought to be multifactorial.   Workup revealed Pseudomonas UTI and sigmoid diverticulitis with presumed abscess concerning for focal/contained perforation.  General surgery also consulted on admission. Imaging studies also showed severe left hydronephrosis and hydroureter with ureter dilated to the level of the pelvis/area of inflammatory change at the sigmoid colon.  Creatinine also elevated at 2.2 on admission due to concern for bladder outlet obstruction. Lower extremity duplex also showed age-indeterminate DVT involving RLE and acute DVT involving LLE.   Assessment & Plan: AKI on CKD stage IIIb, Left hydronephrosis Baseline creatinine around 1.6 Admitted with serum creatinine of 2.2 Poor oral intake prior to presentation Renal ultrasound shows severe left hydronephrosis; also noted on CT was hydroureter with ureter dilated to the level of the pelvis/area of inflammatory change at the sigmoid Urology was consulted. Initial plan stent placement if creatinine declines or fails to improve Creatinine improved on 12/17, therefore urology holding off on stent placement at this time as showing improvement Renal ultrasound repeated on 08/11/2024.  Moderate to severe but stable left hydronephrosis; renal function continues to improve as well Eventual outpatient follow-up with urology for repeat imaging again   Acute diverticulitis with abscess CT on 08/04/2024 shows diffuse diverticular disease of the colon with presumed sigmoid diverticulitis  and gas fluid collection involving left pelvic sidewall measuring 5.9 cm concerning for focal contained perforation or abscess General Surgery was consulted.  no recommendations for intervention at this time. Fluid collection is near iliac bone and not amenable for drainage 2 weeks antibiotics recommended per surgery transition abx to Augmentin  to complete course; completed 12/24 Diet has been advanced.  So far tolerating well although poor appetite.  No abdominal pain.  Bilateral LE DVT, left leg SVT Significant edema noted in LLE and duplex shows acute DVT involving left common femoral vein, SF junction, femoral vein, proximal profunda vein, popliteal vein, posterior tibial vein, peroneal vein, external iliac vein Age-indeterminate RLE DVT involving right popliteal, posterior tibial, peroneal veins Was on Lovenox . Transition to lower Eliquis . for ease of administration; poor candidate for Lovenox  at home due to cognitive issues  Metastatic non-small cell lung cancer with solitary cerebral brain metastasis Follows with Dr. Sherrod and rad onc. Completed concurrent chemoradiation and 1 year of Durvalumab  consolidation therapy.   Currently on stereotactic radiosurgery for brain mets.  She completed the course of Decadron .   Oncology was consulted.  Physical deconditioning Patient lives with son but in need of 24-hour assistance; ultimately would benefit from LTC especially with memory problems lately  Insurance denied for SNF; denied by [name] as well; Plan is to go home with home hospice.   Chronic hypoxic respiratory failure/COPD Currently not on exacerbation.  On 3 L of oxygen  chronically at home.  Continue bronchodilators, inhalers  Acute on chronic normocytic anemia Baseline hemoglobin around 7 - 8 g/dL Admitted with hemoglobin of 6.8 g/dL.  Likely associated with malignancy.  No evidence of acute blood loss.   S/p 2 units PRBC since admission  Continue ferrous sulfate .    Hypertension Blood pressure is controlled  Takes metoprolol  at home.  Currently on hold   CAD Elevated troponin  On aspirin , Lipitor , Repatha . No anginal symptoms. Takes aspirin  at home, currently on hold, Elevated troponin is likely from demand ischemia from anemia.   History of skin cancer Underwent resection of the skin tumor on the left leg on October 2025 Wound care consulted.  Wound appears to be slightly infected with purulent material.  Treated with antibiotics.   History of recurrent UTI On chronic therapy with nitrofurantoin .  Goals of care conversation. Multiple interaction with the family by prior providers. Currently family engaging with hospice. At the time of my evaluation patient's goal remains to go home. Plan is to go home with hospice.

## 2024-08-09 NOTE — Progress Notes (Signed)
 OT Cancellation Note  Patient Details Name: Abigail [name] MRN: [id number] DOB: 06/11/1936   Cancelled Treatment:    Reason Eval/Treat Not Completed: Other (comment) (comfort care measures)  Patient will be discharged from OT now being on comfort care measures. Thank you for this referral.   [facility] OT/L Acute Rehabilitation Department  [phone]   08/09/2024, 8:41 AM

## 2024-08-09 NOTE — Progress Notes (Signed)
 PHARMACY - ANTICOAGULATION CONSULT NOTE  Pharmacy Consult for Enoxaparin  Indication: DVT  Allergies[1]  Patient Measurements: Height: 5' 6 (167.6 cm) Weight: 71.5 kg (157 lb 10.1 oz) IBW/kg (Calculated) : 59.3 HEPARIN  DW (KG): 71.5  Vital Signs: Temp: 98.4 F (36.9 C) (12/16 0434) Temp Source: Oral (12/16 0434) BP: 128/47 (12/16 0434) Pulse Rate: 73 (12/16 0434)  Labs: Recent Labs    08/06/24 1907 08/08/24 0343 08/08/24 1132 08/09/24 0418  HGB 8.2*  --  8.0* 8.3*  HCT 26.5*  --  26.7* 27.7*  PLT  --   --  239 249  CREATININE  --  2.37*  --   --     Estimated Creatinine Clearance: 17.3 mL/min (A) (by C-G formula based on SCr of 2.37 mg/dL (H)).   Medical History: Past Medical History:  Diagnosis Date   Anemia    Arthritis    knees, back   Bacteremia 04/08/2012   CAP (community acquired pneumonia) 04/12/2012   Carotid artery occlusion    Chronic kidney disease    stage 3 ckd no nephrologist   Complete uterine prolapse with prolapse of anterior vaginal wall    Complication of anesthesia    hard to wake up per pt   Constipation    Coronary artery disease    Diverticulitis yrs ago coialitis   Dyspnea    History of blood transfusion    History of radiation therapy    right lung 08/05/2021-09/18/2021  Dr Lynwood Nasuti   Hypertension    Hypothyroid    LLQ abdominal pain 04/05/2012   Lung cancer (HCC)    Numbness    in hands at times   Pneumonia    Pre-diabetes    Scoliosis    STEMI (ST elevation myocardial infarction) (HCC) 10/26/2019   DES RCA   Wears dentures    full dentures   Wears glasses    for reading    Medications:  Scheduled:   atorvastatin   80 mg Oral Daily   budesonide -glycopyrrolate -formoterol   2 puff Inhalation BID   Chlorhexidine  Gluconate Cloth  6 each Topical Daily   ferrous sulfate   325 mg Oral Q breakfast   levothyroxine   175 mcg Oral Once per day on Monday Tuesday Wednesday Thursday   pantoprazole   40 mg Oral Daily    polyethylene glycol  17 g Oral Daily   Ensure Max Protein  11 oz Oral BID BM   Infusions:   meropenem  (MERREM ) IV 1 g (08/09/24 0407)    Assessment: Pharmacy is consulted to dose Lovenox  on 12/15 for DVT.  Dr Delorse Kipper NP recommend for Dr. Sherrod to review before starting anticoagulation. Note SCr 2.37 and increasing.  CrCl < 30 ml/min CBC: Hgb up to 8.3 (transfused PRBC on 12/10 and 12/14).  Plt WNL Per Dr. Sherrod, ok to treat DVT with Lovenox   Goal of Therapy:  Anti-Xa level 0.6-1 units/ml 4hrs after LMWH dose given Monitor platelets by anticoagulation protocol: Yes   Plan:  Due to CrCl < 30, start Lovenox  1mg /kg SQ q24 Monitor for signs/symptoms of bleeding Daily CBC   Eva CHRISTELLA Allis, PharmD, BCPS Secure Chat if ?s 08/09/2024 8:07 AM        [1]  Allergies Allergen Reactions   Codeine Nausea And Vomiting   Norvasc  [Amlodipine ] Rash

## 2024-08-09 NOTE — Plan of Care (Signed)
°  Problem: Education: Goal: Knowledge of General Education information will improve Description: Including pain rating scale, medication(s)/side effects and non-pharmacologic comfort measures Outcome: Progressing   Problem: Health Behavior/Discharge Planning: Goal: Ability to manage health-related needs will improve Outcome: Progressing   Problem: Clinical Measurements: Goal: Respiratory complications will improve Outcome: Progressing Goal: Cardiovascular complication will be avoided Outcome: Progressing   Problem: Activity: Goal: Risk for activity intolerance will decrease Outcome: Progressing   Problem: Nutrition: Goal: Adequate nutrition will be maintained Outcome: Progressing   Problem: Coping: Goal: Level of anxiety will decrease Outcome: Progressing   Problem: Elimination: Goal: Will not experience complications related to bowel motility Outcome: Progressing Goal: Will not experience complications related to urinary retention Outcome: Progressing   Problem: Pain Managment: Goal: General experience of comfort will improve and/or be controlled Outcome: Progressing   Problem: Safety: Goal: Ability to remain free from injury will improve Outcome: Progressing

## 2024-08-09 NOTE — Progress Notes (Signed)
 Subjective: Major transition as of this morning.  Abigail [name] has woken up coherently and wants to proceed with any necessary medical interventions.  Objective: Vital signs in last 24 hours: Temp:  [97.8 F (36.6 C)-98.4 F (36.9 C)] 98.4 F (36.9 C) (12/16 0434) Pulse Rate:  [73-79] 73 (12/16 0434) Resp:  [18-20] 20 (12/16 0434) BP: (103-147)/(47-61) 128/47 (12/16 0434) SpO2:  [96 %-100 %] 96 % (12/16 0905)  Assessment/Plan: # Left hydronephrosis #AoCKD  Left hydroureteronephrosis extending down to the level of a contained abscess/perforation of diverticulitis.  Patient presently continuing conservatively with antibiotics.  Labs were not collected today as patient was entering hospice.  Will reevaluate tomorrow morning and if her renal function continues to decline, will try to find OR availability for ureteral stent placement.  Intake/Output from previous day: 12/15 0701 - 12/16 0700 In: 780 [P.O.:480; IV Piggyback:300] Out: 350 [Urine:350]  Intake/Output this shift: Total I/O In: 440 [P.O.:440] Out: -   Physical Exam:  General: Decompensated, chronically ill, unresponsive CV: No cyanosis Lungs: equal chest rise   Lab Results: Recent Labs    08/06/24 1907 08/08/24 1132 08/09/24 0418  HGB 8.2* 8.0* 8.3*  HCT 26.5* 26.7* 27.7*   BMET Recent Labs    08/08/24 0343 08/08/24 1132 08/09/24 0418  NA 136  --   --   K 4.8  --   --   CL 105  --   --   CO2 20*  --   --   GLUCOSE 113*  --   --   BUN 56*  --   --   CREATININE 2.37*  --   --   CALCIUM  8.2*  --   --   HGB  --  8.0* 8.3*  WBC  --  7.9 7.0     Studies/Results: VAS US  LOWER EXTREMITY VENOUS (DVT) Result Date: 08/08/2024  Lower Venous DVT Study Patient Name:  Abigail [name]  Date of Exam:   08/08/2024 Medical Rec #: [id number]     Accession #:    [id number] Date of Birth: 25-Feb-1936     Patient Gender: F Patient Age:   88 years Exam Location:  [facility] Procedure:      VAS US  LOWER  EXTREMITY VENOUS (DVT) Referring Phys: LEATRICE OGBATA --------------------------------------------------------------------------------  Indications: Edema. Other Indications: Severe left-sided hydroureteronephrosis. Risk Factors: Cancer Metastatic non-small lung cancer to brain, on radiation. Recent surgery for skin cancer on left calf. Limitations: Poor ultrasound/tissue interface and Edema, bowel gas. Comparison Study: No prior study on file Performing Technologist: Alberta Lis RVS  Examination Guidelines: A complete evaluation includes B-mode imaging, spectral Doppler, color Doppler, and power Doppler as needed of all accessible portions of each vessel. Bilateral testing is considered an integral part of a complete examination. Limited examinations for reoccurring indications may be performed as noted. The reflux portion of the exam is performed with the patient in reverse Trendelenburg.  +---------+---------------+---------+-----------+----------+-----------------+ RIGHT    CompressibilityPhasicitySpontaneityPropertiesThrombus Aging    +---------+---------------+---------+-----------+----------+-----------------+ CFV      Full           Yes      Yes                                    +---------+---------------+---------+-----------+----------+-----------------+ SFJ      Full                                                           +---------+---------------+---------+-----------+----------+-----------------+  FV Prox  Full           Yes      Yes                                    +---------+---------------+---------+-----------+----------+-----------------+ FV Mid   Full                                                           +---------+---------------+---------+-----------+----------+-----------------+ FV DistalFull                                                           +---------+---------------+---------+-----------+----------+-----------------+ PFV       Full           Yes      Yes                                    +---------+---------------+---------+-----------+----------+-----------------+ POP      None           No       No                   Age Indeterminate +---------+---------------+---------+-----------+----------+-----------------+ PTV      None                                         Age Indeterminate +---------+---------------+---------+-----------+----------+-----------------+ PERO     None                                         Age Indeterminate +---------+---------------+---------+-----------+----------+-----------------+   +---------+---------------+---------+-----------+----------+-------------------+ LEFT     CompressibilityPhasicitySpontaneityPropertiesThrombus Aging      +---------+---------------+---------+-----------+----------+-------------------+ CFV      None           No       No                   Acute               +---------+---------------+---------+-----------+----------+-------------------+ SFJ      None           No       No                   Acute               +---------+---------------+---------+-----------+----------+-------------------+ FV Prox  None           No       No                   Acute               +---------+---------------+---------+-----------+----------+-------------------+ FV Mid   None  Acute               +---------+---------------+---------+-----------+----------+-------------------+ FV DistalNone                                         Acute               +---------+---------------+---------+-----------+----------+-------------------+ PFV      None                                         Acute               +---------+---------------+---------+-----------+----------+-------------------+ POP      None           No       No                   Acute                +---------+---------------+---------+-----------+----------+-------------------+ PTV      None                                         Acute               +---------+---------------+---------+-----------+----------+-------------------+ PERO     None                                         Acute               +---------+---------------+---------+-----------+----------+-------------------+ Soleal   None                                         Acute               +---------+---------------+---------+-----------+----------+-------------------+ Gastroc  None                                         Acute               +---------+---------------+---------+-----------+----------+-------------------+ GSV      None                                         Acute in proximal                                                         portion             +---------+---------------+---------+-----------+----------+-------------------+ EIV  Acute               +---------+---------------+---------+-----------+----------+-------------------+ IVC                                                   patent              +---------+---------------+---------+-----------+----------+-------------------+ Unable to visualized the common iliac veins secondary to bowel gas    Summary: RIGHT: - Findings consistent with age indeterminate deep vein thrombosis involving the right popliteal vein, right posterior tibial veins, and right peroneal veins.   LEFT: - Findings consistent with acute deep vein thrombosis involving the left common femoral vein, SF junction, left femoral vein, left proximal profunda vein, left popliteal vein, left posterior tibial veins, left peroneal veins, and External iliac vein. - Findings consistent with acute superficial vein thrombosis involving the left great saphenous vein. Findings consistent with acute intramuscular  thrombosis involving the left gastrocnemius veins, and left soleal veins.  *See table(s) above for measurements and observations. Electronically signed by Fonda Rim on 08/08/2024 at 5:50:01 PM.    Final       LOS: 6 days   Ole Bourdon, NP [facility] Pager: [phone]  08/09/2024, 2:55 PM

## 2024-08-09 NOTE — Progress Notes (Signed)
 Progress Note    Abigail [name]   FMW:[id number]  DOB: 1936/08/12  DOA: 08/03/2024     6 PCP: Abigail Speaks, FNP  Initial CC: Weakness  Hospital Course: Abigail [name] is an 88 year old female with PMH metastatic non-small lung cancer with mets to brain, currently on radiation treatment, chronic hypoxic respiratory failure on 3 L O2, CKD stage IIIb, 2 skin cancers on her legs s/p resection, HTN, CAD, COPD, chronic normocytic anemia and hypothyroidism.   Patient was admitted with generalized weakness and severe anemia, hemoglobin of 6.7 g/dL.  Patient has been transfused 2 units of packed red blood cells.  Weakness is thought to be multifactorial.   Workup revealed Pseudomonas UTI and sigmoid diverticulitis with presumed abscess concerning for focal/contained perforation.  General surgery also consulted on admission. Imaging studies also showed severe left hydronephrosis and hydroureter with ureter dilated to the level of the pelvis/area of inflammatory change at the sigmoid colon.  Creatinine also elevated at 2.2 on admission due to concern for bladder outlet obstruction. Lower extremity duplex also showed age-indeterminate DVT involving RLE and acute DVT involving LLE.   Assessment & Plan:   AKI on CKD stage IIIb BOO/Left hydronephrosis - Baseline creatinine around 1.6 - Admitted with serum creatinine of 2.2 -Poor oral intake prior to presentation - Renal ultrasound shows severe left hydronephrosis; also noted on CT was hydroureter with ureter dilated to the level of the pelvis/area of inflammatory change at the sigmoid - Urology following, appreciate assistance.  Tentative plan for potential stent placement if creatinine declines or fails to improve - Hold Lovenox  for now; can resume after procedures   Diverticulitis with abscess - CT on 08/04/2024 shows diffuse diverticular disease of the colon with presumed sigmoid diverticulitis and gas fluid collection involving left pelvic sidewall  measuring 5.9 cm concerning for focal contained perforation or abscess - General Surgery following, no recommendations for intervention at this time.  Fluid collection is near iliac bone and not amenable for drainage - 2 weeks antibiotics recommended per surgery - Diet has been advanced  Bilateral LE DVT - Significant edema noted in LLE and duplex shows acute DVT involving left common femoral vein, SF junction, femoral vein, proximal profunda vein, popliteal vein, posterior tibial vein, peroneal vein, external iliac vein - Age-indeterminate RLE DVT involving right popliteal, posterior tibial, peroneal veins - see above; will resume Lovenox  after potential ureteral stent  LLE SVT - Duplex also noted superficial venous thrombosis involving left great saphenous vein and acute intramuscular thrombosis involving left gastrocnemius veins and left soleal veins  Metastatic non-small cell lung cancer with solitary cerebral brain metastasis -Follows with Abigail [name] and rad onc.  Completed concurrent chemoradiation and 1 year of Durvalumab  consolidation therapy.   - Currently on stereotactic radiosurgery for brain mets.  She completed the course of Decadron .   - Plan of care also following, appreciate assistance.  Patient has difficulty partaking with GOC discussions and adequate comprehension/concentration centered around discussions   Chronic hypoxic respiratory failure/COPD -Currently not on exacerbation.  On 3 L of oxygen  chronically at home.  Continue bronchodilators, inhalers  Acute on chronic normocytic anemia -Baseline hemoglobin around 7 - 8 g/dL - Admitted with hemoglobin of 6.8 g/dL.  Likely associated with malignancy.  No evidence of acute blood loss.   - s/p 2 units PRBC since admission  -Continue ferrous sulfate .   Hypertension - Blood pressure is controlled -Takes metoprolol  at home.  Currently on hold   CAD Elevated troponin  -On aspirin ,  Lipitor , Repatha .  No anginal symptoms.   Takes aspirin  at home, currently on hold, will resume on discharge if no evidence of GI bleed. Denies any chest pain today.  Elevated troponin is likely from demand ischemia from anemia.   History of skin cancer -Underwent resection of the skin tumor on the left leg on October of this year.  Wound care consulted.  Wound appears to be slightly infected with purulent material.  Patient is on IV meropenem    History of recurrent UTI -On chronic therapy with nitrofurantoin . She has  noted frequency, dysuria symptoms.  Also complained of left lower quadrant pain.  Checking CT abdomen/pelvis without contrast.  Started on ceftriaxone .  Urine culture showing 30,000 colonies of Pseudomonas along with mixed organisms. - continue IV meropenem    Physical deconditioning -Patient lives with son but possibly in need of SNF - will ask for PT/OT to remain on team given no longer pursuing comfort care  Interval History:  No events overnight.  Spent at least 30 minutes in the room explaining multiple diagnoses and treatment plans which was followed by patient immediately not remembering much of any of the conversation or able to comprehend or reexplain the plan.  Continuously deferred to her treatment team for treatment plan and recommendations but then wanted to still make her own decisions but could not understand risks and benefits of any of her decisions she was trying to make.  Her son was present at the end of our conversation and witnessed her inability to truly comprehend the severity of her diagnoses and potential offers of treatment.  Palliative care also met with her and also had difficulty with patient adequately showing comprehension and appropriate decision making ability. Patient's daughter should be arriving from out of town soon also. At this time we are continuing with full scope of care.  Urology reengaged this morning. Ongoing essentially aggressive medical treatment at this  time.   Antimicrobials: Rocephin  08/03/2024 >> 08/05/2024 Flagyl  08/04/2024 >> 08/08/2024 Meropenem  08/06/2024 >> current  DVT prophylaxis:  SCDs Start: 08/03/24 1141   Code Status:   Code Status: Full Code  Mobility Assessment (Last 72 Hours)     Mobility Assessment     Row Name 08/09/24 0851 08/08/24 1920 08/08/24 1055 08/07/24 2000 08/07/24 0845   Does the patient have exclusion criteria? No- Perform mobility assessment No- Perform mobility assessment No- Perform mobility assessment No- Perform mobility assessment No- Perform mobility assessment   What is the highest level of mobility based on the mobility assessment? Level 3 (Stands with assistance) - Balance while standing  and cannot march in place Level 3 (Stands with assistance) - Balance while standing  and cannot march in place Level 3 (Stands with assistance) - Balance while standing  and cannot march in place Level 3 (Stands with assistance) - Balance while standing  and cannot march in place Level 3 (Stands with assistance) - Balance while standing  and cannot march in place   Is the above level different from baseline mobility prior to current illness? Yes - Recommend PT order Yes - Recommend PT order Yes - Recommend PT order Yes - Recommend PT order Yes - Recommend PT order    Row Name 08/06/24 2027           Does the patient have exclusion criteria? No- Perform mobility assessment       What is the highest level of mobility based on the mobility assessment? Level 3 (Stands with assistance) - Balance while standing  and cannot march in place       Is the above level different from baseline mobility prior to current illness? Yes - Recommend PT order          Diet: Diet Orders (From admission, onward)     Start     Ordered   08/09/24 1012  DIET SOFT Room service appropriate? Yes; Fluid consistency: Thin  Diet effective now       Question Answer Comment  Room service appropriate? Yes   Fluid consistency: Thin       08/09/24 1011            Barriers to discharge: Patient changing mind regarding GOC Disposition Plan: TBD HH orders placed: TBD Status is: Inpatient  Objective: Blood pressure (!) 139/56, pulse 89, temperature 98.4 F (36.9 C), temperature source Oral, resp. rate 14, height 5' 6 (1.676 m), weight 71.5 kg, SpO2 98%.  Examination:  Physical Exam Constitutional:      Appearance: Normal appearance.  HENT:     Head: Normocephalic and atraumatic.     Mouth/Throat:     Mouth: Mucous membranes are moist.  Eyes:     Extraocular Movements: Extraocular movements intact.  Cardiovascular:     Rate and Rhythm: Normal rate and regular rhythm.  Pulmonary:     Effort: Pulmonary effort is normal. No respiratory distress.     Breath sounds: Normal breath sounds. No wheezing.  Abdominal:     General: Bowel sounds are normal. There is no distension.     Palpations: Abdomen is soft.     Tenderness: There is no abdominal tenderness.  Musculoskeletal:        General: Normal range of motion.     Cervical back: Normal range of motion and neck supple.     Left lower leg: Edema (4+ pitting edema throughout LLE) present.  Skin:    General: Skin is warm and dry.  Neurological:     General: No focal deficit present.     Mental Status: She is alert.  Psychiatric:        Attention and Perception: She is inattentive.        Speech: Speech is tangential.        Cognition and Memory: Cognition is impaired. Memory is impaired. She exhibits impaired recent memory.      Consultants:  Palliative care Urology  Procedures:    Data Reviewed: Results for orders placed or performed during the hospital encounter of 08/03/24 (from the past 24 hours)  Glucose, capillary     Status: Abnormal   Collection Time: 08/09/24 12:00 AM  Result Value Ref Range   Glucose-Capillary 197 (H) 70 - 99 mg/dL  CBC with Differential/Platelet     Status: Abnormal   Collection Time: 08/09/24  4:18 AM  Result Value  Ref Range   WBC 7.0 4.0 - 10.5 K/uL   RBC 3.01 (L) 3.87 - 5.11 MIL/uL   Hemoglobin 8.3 (L) 12.0 - 15.0 g/dL   HCT 72.2 (L) 63.9 - 53.9 %   MCV 92.0 80.0 - 100.0 fL   MCH 27.6 26.0 - 34.0 pg   MCHC 30.0 30.0 - 36.0 g/dL   RDW 82.3 (H) 88.4 - 84.4 %   Platelets 249 150 - 400 K/uL   nRBC 0.0 0.0 - 0.2 %   Neutrophils Relative % 75 %   Neutro Abs 5.3 1.7 - 7.7 K/uL   Lymphocytes Relative 4 %   Lymphs Abs 0.3 (L) 0.7 - 4.0 K/uL  Monocytes Relative 10 %   Monocytes Absolute 0.7 0.1 - 1.0 K/uL   Eosinophils Relative 2 %   Eosinophils Absolute 0.1 0.0 - 0.5 K/uL   Basophils Relative 1 %   Basophils Absolute 0.1 0.0 - 0.1 K/uL   WBC Morphology See Note    Smear Review Normal platelet morphology    Immature Granulocytes 8 %   Abs Immature Granulocytes 0.52 (H) 0.00 - 0.07 K/uL   Ovalocytes PRESENT   Glucose, capillary     Status: Abnormal   Collection Time: 08/09/24  8:04 AM  Result Value Ref Range   Glucose-Capillary 128 (H) 70 - 99 mg/dL  Glucose, capillary     Status: Abnormal   Collection Time: 08/09/24 11:52 AM  Result Value Ref Range   Glucose-Capillary 133 (H) 70 - 99 mg/dL  Glucose, capillary     Status: Abnormal   Collection Time: 08/09/24  4:51 PM  Result Value Ref Range   Glucose-Capillary 165 (H) 70 - 99 mg/dL    I have reviewed pertinent nursing notes, vitals, labs, and images as necessary. I have ordered labwork to follow up on as indicated.  I have reviewed the last notes from staff over past 24 hours. I have discussed patient's care plan and test results with nursing staff, CM/SW, and other staff as appropriate.  Old records reviewed in assessment of this patient  Time spent: Greater than 50% of the 55 minute visit was spent in counseling/coordination of care for the patient as laid out in the A&P.   LOS: 6 days   Alm Apo, MD [facility] 08/09/2024, 5:20 PM

## 2024-08-10 DIAGNOSIS — K5720 Diverticulitis of large intestine with perforation and abscess without bleeding: Secondary | ICD-10-CM | POA: Diagnosis not present

## 2024-08-10 DIAGNOSIS — N1330 Unspecified hydronephrosis: Secondary | ICD-10-CM | POA: Diagnosis not present

## 2024-08-10 DIAGNOSIS — N179 Acute kidney failure, unspecified: Secondary | ICD-10-CM | POA: Diagnosis not present

## 2024-08-10 DIAGNOSIS — N3000 Acute cystitis without hematuria: Secondary | ICD-10-CM | POA: Diagnosis not present

## 2024-08-10 LAB — CBC WITH DIFFERENTIAL/PLATELET
Abs Immature Granulocytes: 0.8 K/uL — ABNORMAL HIGH (ref 0.00–0.07)
Band Neutrophils: 2 %
Basophils Absolute: 0.1 K/uL (ref 0.0–0.1)
Basophils Relative: 1 %
Eosinophils Absolute: 0.1 K/uL (ref 0.0–0.5)
Eosinophils Relative: 2 %
HCT: 28.9 % — ABNORMAL LOW (ref 36.0–46.0)
Hemoglobin: 8.6 g/dL — ABNORMAL LOW (ref 12.0–15.0)
Lymphocytes Relative: 5 %
Lymphs Abs: 0.3 K/uL — ABNORMAL LOW (ref 0.7–4.0)
MCH: 27.5 pg (ref 26.0–34.0)
MCHC: 29.8 g/dL — ABNORMAL LOW (ref 30.0–36.0)
MCV: 92.3 fL (ref 80.0–100.0)
Metamyelocytes Relative: 8 %
Monocytes Absolute: 0.1 K/uL (ref 0.1–1.0)
Monocytes Relative: 2 %
Myelocytes: 4 %
Neutro Abs: 5.2 K/uL (ref 1.7–7.7)
Neutrophils Relative %: 76 %
Platelets: 268 K/uL (ref 150–400)
RBC: 3.13 MIL/uL — ABNORMAL LOW (ref 3.87–5.11)
RDW: 17.6 % — ABNORMAL HIGH (ref 11.5–15.5)
Smear Review: NORMAL
WBC: 6.7 K/uL (ref 4.0–10.5)
nRBC: 0 % (ref 0.0–0.2)

## 2024-08-10 LAB — BASIC METABOLIC PANEL WITH GFR
Anion gap: 9 (ref 5–15)
BUN: 52 mg/dL — ABNORMAL HIGH (ref 8–23)
CO2: 22 mmol/L (ref 22–32)
Calcium: 8.2 mg/dL — ABNORMAL LOW (ref 8.9–10.3)
Chloride: 106 mmol/L (ref 98–111)
Creatinine, Ser: 1.86 mg/dL — ABNORMAL HIGH (ref 0.44–1.00)
GFR, Estimated: 26 mL/min — ABNORMAL LOW (ref >60)
Glucose, Bld: 107 mg/dL — ABNORMAL HIGH (ref 70–99)
Potassium: 4.8 mmol/L (ref 3.5–5.1)
Sodium: 137 mmol/L (ref 135–145)

## 2024-08-10 LAB — GLUCOSE, CAPILLARY
Glucose-Capillary: 106 mg/dL — ABNORMAL HIGH (ref 70–99)
Glucose-Capillary: 111 mg/dL — ABNORMAL HIGH (ref 70–99)
Glucose-Capillary: 196 mg/dL — ABNORMAL HIGH (ref 70–99)
Glucose-Capillary: 191 mg/dL — ABNORMAL HIGH (ref 70–99)

## 2024-08-10 LAB — MAGNESIUM: Magnesium: 2.2 mg/dL (ref 1.7–2.4)

## 2024-08-10 MED ORDER — ENOXAPARIN SODIUM 80 MG/0.8ML IJ SOSY
70.0000 mg | PREFILLED_SYRINGE | Freq: Every day | INTRAMUSCULAR | Status: DC
  Administered 2024-08-10 – 2024-08-12 (×3): 70 mg via SUBCUTANEOUS
  Filled 2024-08-10 (×3): qty 0.8

## 2024-08-10 MED ORDER — AMOXICILLIN-POT CLAVULANATE 500-125 MG PO TABS
1.0000 | ORAL_TABLET | Freq: Two times a day (BID) | ORAL | Status: AC
  Administered 2024-08-10 – 2024-08-17 (×15): 1 via ORAL
  Filled 2024-08-10 (×17): qty 1

## 2024-08-10 NOTE — Plan of Care (Signed)
°  Daily Progress Note   Patient Name: Abigail [name]       Date: 08/10/2024 DOB: May 23, 1936  Age: 88 y.o. MRN#: [id number] Attending Physician: Patsy Lenis, MD Primary Care Physician: [name], FNP Admit Date: 08/03/2024 Length of Stay: 7 days  Discussed care with primary hospitalist today.  Current plan for medical care is evaluation by PT/OT to determine therapy needs moving forward.  Patient wanting to participate with PT/OT evaluation and current appropriate medical interventions.  Patient's creatinine is improving so not planning for stent placement from urology perspective at this time. Noted palliative medicine team will continue to follow along with patient's medical journey.   Tinnie Radar, DO Palliative Care Provider PMT # [phone]  No Charge Note

## 2024-08-10 NOTE — Plan of Care (Signed)
  Problem: Health Behavior/Discharge Planning: Goal: Ability to manage health-related needs will improve Outcome: Progressing   Problem: Clinical Measurements: Goal: Will remain free from infection Outcome: Progressing Goal: Diagnostic test results will improve Outcome: Progressing Goal: Respiratory complications will improve Outcome: Progressing Goal: Cardiovascular complication will be avoided Outcome: Progressing   Problem: Activity: Goal: Risk for activity intolerance will decrease Outcome: Progressing   Problem: Nutrition: Goal: Adequate nutrition will be maintained Outcome: Progressing   Problem: Coping: Goal: Level of anxiety will decrease Outcome: Progressing   Problem: Elimination: Goal: Will not experience complications related to bowel motility Outcome: Progressing Goal: Will not experience complications related to urinary retention Outcome: Progressing   Problem: Pain Managment: Goal: General experience of comfort will improve and/or be controlled Outcome: Progressing   Problem: Safety: Goal: Ability to remain free from injury will improve Outcome: Progressing   Problem: Skin Integrity: Goal: Risk for impaired skin integrity will decrease Outcome: Progressing

## 2024-08-10 NOTE — Progress Notes (Signed)
 OT Cancellation Note  Patient Details Name: Abigail [name] MRN: [id number] DOB: [birth year]   Cancelled Treatment:    Reason Eval/Treat Not Completed: Patient declined, stating she wanted to take a nap. She asked for therapy to come back later.    Delanna JINNY Lesches, OTR/L 08/10/2024, 5:05 PM

## 2024-08-10 NOTE — Progress Notes (Signed)
 Physical Therapy Treatment Patient Details Name: Abigail [name] MRN: [id number] DOB: Jul 26, 1936 Today's Date: 08/10/2024   History of Present Illness Pt is an 88y.o female admitted on 08/03/24 with generalized weakness. She fell at home while trying to put her pants on and was unable to get up. PMH includes: metastatic non-small lung cancer to brain, currently on radiation treatment, chronic hypoxic respiratory failure on 3 L O2, CKD stage IIIb, 2 skin cancers on her legs s/p resection, hypertension, coronary artery disease, chronic normocytic anemia, hypothyroidism.    PT Comments  Re-eval at hospitalist's request-pt no longer comfort care. Pt agreeable to working with therapy on today. O2 >90% on 3L [place] throughout session. Pt presents with general weakness, decreased activity tolerance, and impaired gait and balance. She ambulated ~12 feet with a RW. She tolerated session well. At this time, will continue to recommend HHPT f/u (also a home health aide), as long as family can provide current level of assistance. Per chart review, there is a palliative care meeting with family planned. Will continue to follow and update recommendations as necessary.    If plan is discharge home, recommend the following: A little help with walking and/or transfers;A little help with bathing/dressing/bathroom;Assist for transportation;Assistance with cooking/housework   Can travel by private vehicle     Yes  Equipment Recommendations  None recommended by PT    Recommendations for Other Services OT consult     Precautions / Restrictions Precautions Precautions: Fall Precaution/Restrictions Comments: O2 dep at baseline-3L Restrictions Weight Bearing Restrictions Per Provider Order: No     Mobility  Bed Mobility Overal bed mobility: Needs Assistance Bed Mobility: Supine to Sit     Supine to sit: HOB elevated, Used rails, Min assist     General bed mobility comments: min cues for use of bed features,  increased time. Assist to scoot to EOB and for trunk to upright.    Transfers Overall transfer level: Needs assistance Equipment used: Rolling walker (2 wheels) Transfers: Sit to/from Stand Sit to Stand: Min assist           General transfer comment: Cues for safety. Assist to rise, steady, control descent.    Ambulation/Gait Ambulation/Gait assistance: Min assist, +2 safety/equipment Gait Distance (Feet): 12 Feet Assistive device: Rolling walker (2 wheels) Gait Pattern/deviations: Step-through pattern, Decreased stride length       General Gait Details: Slow gait speed. Assist to steady pt throughout distance. Remained on [place] O2. Pt is unsteady and fatigues fairly quickly-LEs shaky.   Stairs             Wheelchair Mobility     Tilt Bed    Modified Rankin (Stroke Patients Only)       Balance Overall balance assessment: Needs assistance         Standing balance support: Bilateral upper extremity supported, During functional activity, Reliant on assistive device for balance Standing balance-Leahy Scale: Fair                              Hotel Manager: No apparent difficulties  Cognition Arousal: Alert Behavior During Therapy: WFL for tasks assessed/performed   PT - Cognitive impairments: No apparent impairments                         Following commands: Intact      Cueing Cueing Techniques: Verbal cues  Exercises      General  Comments        Pertinent Vitals/Pain Pain Assessment Pain Assessment: Faces Faces Pain Scale: Hurts little more Pain Location: L UE (tingling), L lower abd/groin area Pain Descriptors / Indicators: Tingling, Discomfort, Tightness Pain Intervention(s): Limited activity within patient's tolerance, Monitored during session, Repositioned    Home Living                          Prior Function            PT Goals (current goals can now be found in the  care plan section) Progress towards PT goals: Progressing toward goals    Frequency    Min 2X/week      PT Plan      Co-evaluation              AM-PAC PT 6 Clicks Mobility   Outcome Measure  Help needed turning from your back to your side while in a flat bed without using bedrails?: A Little Help needed moving from lying on your back to sitting on the side of a flat bed without using bedrails?: A Little Help needed moving to and from a bed to a chair (including a wheelchair)?: A Little Help needed standing up from a chair using your arms (e.g., wheelchair or bedside chair)?: A Little Help needed to walk in hospital room?: A Lot Help needed climbing 3-5 steps with a railing? : Total 6 Click Score: 15    End of Session Equipment Utilized During Treatment: Gait belt;Oxygen  Activity Tolerance: Patient tolerated treatment well;Patient limited by fatigue Patient left: in chair;with call bell/phone within reach;with chair alarm set (pad in chair, box not working)   PT Visit Diagnosis: Unsteadiness on feet (R26.81);Muscle weakness (generalized) (M62.81);Difficulty in walking, not elsewhere classified (R26.2)     Time: 1206-1226 PT Time Calculation (min) (ACUTE ONLY): 20 min  Charges:      PT General Charges $$ ACUTE PT VISIT: 1 Visit                        Dannial SQUIBB, PT Acute Rehabilitation  Office: [phone]

## 2024-08-10 NOTE — Evaluation (Signed)
 Occupational Therapy Re-evaluation Patient Details Name: Abigail [name] MRN: [id number] DOB: 04-07-1936 Today's Date: 08/10/2024   History of Present Illness   Pt is an 88 yr old female admitted on 08/03/24 with generalized weakness. She fell at home while trying to put her pants on and was unable to get up. PMH includes: metastatic non-small lung cancer to brain, currently on radiation treatment, chronic hypoxic respiratory failure on 3 L O2, CKD stage IIIb, 2 skin cancers on her legs s/p resection, hypertension, coronary artery disease, chronic normocytic anemia, hypothyroidism.     Clinical Impressions OT re-eval completed this date, due to patient may no longer be definitively decided on solely comfort care, and she desires to and is able to participate in therapy services. The pt is currently presenting with compromised ADL performance and she requires assistance for self-care management. During the session today, she reported having 6/10 abdominal pain and feelings of generalized weakness. She was further noted to be with LLE edema and difficulty fully flexing the knee, which she attributes to her cancer. She declined to attempt bed mobility and out of bed ADLs, stating she was just assisted back to bed and was comfortable. Based on clinical judgement and her observed functional abilities, she requires min-mod assist for self-care tasks such as lower body dressing & toileting. OT will continue to follow her for services in the acute care setting. OT recommends home with family assist and home health OT vs. short-term SNF rehab, pending availability of family assistance and pt's functional progress in the hospital setting.      If plan is discharge home, recommend the following:   A little help with walking and/or transfers;Assistance with cooking/housework;Help with stairs or ramp for entrance;Assist for transportation;A little help with bathing/dressing/bathroom     Functional Status  Assessment   Patient has had a recent decline in their functional status and demonstrates the ability to make significant improvements in function in a reasonable and predictable amount of time.     Equipment Recommendations   BSC/3in1     Recommendations for Other Services         Precautions/Restrictions   Precautions Precautions: Fall Restrictions Weight Bearing Restrictions Per Provider Order: No Other Position/Activity Restrictions: uses 3L O2 all the time at her baseline     Mobility Bed Mobility      General bed mobility comments: the pt politely declined to attempt bed mobility, stating she was just assisted back to bed and was comfortable    Transfers        General transfer comment: unable to assess      Balance       Sitting balance - Comments: unable to assess       Standing balance comment: unable to assess           ADL either performed or assessed with clinical judgement   ADL Overall ADL's : Needs assistance/impaired Eating/Feeding: Set up;Sitting;Bed level   Grooming: Set up;Bed level   Upper Body Bathing: Set up;Bed level   Lower Body Bathing: Moderate assistance;Bed level   Upper Body Dressing : Set up;Bed level   Lower Body Dressing: Moderate assistance;Bed level       Toileting- Clothing Manipulation and Hygiene: Moderate assistance;Bed level               Vision   Additional Comments: She correctly read the time depicted on the wall clock.            Pertinent Vitals/Pain Pain  Assessment Pain Assessment: 0-10 Pain Score: 6  Pain Location: abdomen Pain Intervention(s): Patient requesting pain meds-RN notified, Limited activity within patient's tolerance, Monitored during session     Extremity/Trunk Assessment Upper Extremity Assessment Upper Extremity Assessment: RUE deficits/detail;LUE deficits/detail;Generalized weakness RUE Deficits / Details: AROM WFL. Grip strength 4/5 LUE Deficits / Details:  AROM WFL. Grip strength 4/5   Lower Extremity Assessment Lower Extremity Assessment: LLE deficits/detail;Generalized weakness;RLE deficits/detail RLE Deficits / Details: AROM WFL LLE Deficits / Details: Edema noted which pt reported to be due to her cancer. Pt unable to fully bend knee in bed. Ankle AROM WFL.       Communication Communication Communication: No apparent difficulties   Cognition Arousal: Alert          OT - Cognition Comments: Oriented to person, place, time and situation. Followed 1 step commands without difficulty.                 Following commands: Intact       Cueing  General Comments   Cueing Techniques: Verbal cues              Home Living Family/patient expects to be discharged to:: Private residence Living Arrangements: Children (son who works) Available Help at Discharge: Family;Available PRN/intermittently Type of Home: House Home Access:  (she stated she is in the process of having a ramp built, however is unsure if it's completed yet)     Home Layout: One level     Bathroom Shower/Tub: Tub/shower unit         Home Equipment: Rollator (4 wheels);Other (comment);Shower seat (Oxygen , 3 wheeled walker)          Prior Functioning/Environment Prior Level of Function : Independent/Modified Independent             Mobility Comments:  (She used a 3 wheeled walker vs. rollator for ambulation inside the home. She rarely went outside the home. She used 3L O2 around the clock.) ADLs Comments:  (She was modified independent to independent with ADLs and she receives meals on wheels.)    OT Problem List: Decreased strength;Decreased activity tolerance;Impaired balance (sitting and/or standing);Cardiopulmonary status limiting activity;Pain   OT Treatment/Interventions: Self-care/ADL training;Therapeutic exercise;Neuromuscular education;Energy conservation;DME and/or AE instruction;Therapeutic activities;Patient/family  education;Balance training      OT Goals(Current goals can be found in the care plan section)   Acute Rehab OT Goals Patient Stated Goal: to return home to her dog OT Goal Formulation: With patient Time For Goal Achievement: 08/24/24 Potential to Achieve Goals: Good ADL Goals Pt Will Perform Grooming: with supervision;standing Pt Will Perform Lower Body Dressing: with supervision;sit to/from stand;with adaptive equipment Pt Will Transfer to Toilet: with supervision;ambulating Pt Will Perform Toileting - Clothing Manipulation and hygiene: with supervision;sit to/from stand   OT Frequency:  Min 2X/week       AM-PAC OT 6 Clicks Daily Activity     Outcome Measure Help from another person eating meals?: A Little Help from another person taking care of personal grooming?: A Little Help from another person toileting, which includes using toliet, bedpan, or urinal?: A Lot Help from another person bathing (including washing, rinsing, drying)?: A Lot Help from another person to put on and taking off regular upper body clothing?: A Little Help from another person to put on and taking off regular lower body clothing?: A Lot 6 Click Score: 15   End of Session Equipment Utilized During Treatment: Oxygen  Nurse Communication: Mobility status  Activity Tolerance: Other (comment) (Fair tolerance)  Patient left: in bed;with call bell/phone within reach;with bed alarm set  OT Visit Diagnosis: Unsteadiness on feet (R26.81);Muscle weakness (generalized) (M62.81);Pain;Other abnormalities of gait and mobility (R26.89)                Time: 8364-8346 OT Time Calculation (min): 18 min Charges:  OT General Charges $OT Visit: 1 Visit OT Evaluation $OT Re-eval: 1 Re-eval    Abigail [name], OTR/L 08/10/2024, 5:49 PM

## 2024-08-10 NOTE — Progress Notes (Signed)
 Progress Note    CACI ORREN   FMW:[id number]  DOB: Aug 23, 1936  DOA: 08/03/2024     7 PCP: [name], FNP  Initial CC: Weakness  Hospital Course: [name] is an 88 year old female with PMH metastatic non-small lung cancer with mets to brain, currently on radiation treatment, chronic hypoxic respiratory failure on 3 L O2, CKD stage IIIb, 2 skin cancers on her legs s/p resection, HTN, CAD, COPD, chronic normocytic anemia and hypothyroidism.   Patient was admitted with generalized weakness and severe anemia, hemoglobin of 6.7 g/dL.  Patient has been transfused 2 units of packed red blood cells.  Weakness is thought to be multifactorial.   Workup revealed Pseudomonas UTI and sigmoid diverticulitis with presumed abscess concerning for focal/contained perforation.  General surgery also consulted on admission. Imaging studies also showed severe left hydronephrosis and hydroureter with ureter dilated to the level of the pelvis/area of inflammatory change at the sigmoid colon.  Creatinine also elevated at 2.2 on admission due to concern for bladder outlet obstruction. Lower extremity duplex also showed age-indeterminate DVT involving RLE and acute DVT involving LLE.   Assessment & Plan:   AKI on CKD stage IIIb BOO/Left hydronephrosis - Baseline creatinine around 1.6 - Admitted with serum creatinine of 2.2 -Poor oral intake prior to presentation - Renal ultrasound shows severe left hydronephrosis; also noted on CT was hydroureter with ureter dilated to the level of the pelvis/area of inflammatory change at the sigmoid - Urology following, appreciate assistance.  Tentative plan for potential stent placement if creatinine declines or fails to improve - Creatinine improved on 12/17, therefore urology holding off on stent placement at this time as showing improvement -Continuing with foley for now and probable repeat of renal ultrasound on Friday to re-evaluate everything   Diverticulitis with  abscess - CT on 08/04/2024 shows diffuse diverticular disease of the colon with presumed sigmoid diverticulitis and gas fluid collection involving left pelvic sidewall measuring 5.9 cm concerning for focal contained perforation or abscess - General Surgery following, no recommendations for intervention at this time.  Fluid collection is near iliac bone and not amenable for drainage - 2 weeks antibiotics recommended per surgery - transition abx to Augmentin  to complete course  - Diet has been advanced  Bilateral LE DVT - Significant edema noted in LLE and duplex shows acute DVT involving left common femoral vein, SF junction, femoral vein, proximal profunda vein, popliteal vein, posterior tibial vein, peroneal vein, external iliac vein - Age-indeterminate RLE DVT involving right popliteal, posterior tibial, peroneal veins - resume Lovenox  on 12/17 as no plans for stent/procedures   LLE SVT - Duplex also noted superficial venous thrombosis involving left great saphenous vein and acute intramuscular thrombosis involving left gastrocnemius veins and left soleal veins  Metastatic non-small cell lung cancer with solitary cerebral brain metastasis -Follows with Dr. Sherrod and rad onc.  Completed concurrent chemoradiation and 1 year of Durvalumab  consolidation therapy.   - Currently on stereotactic radiosurgery for brain mets.  She completed the course of Decadron .   - Plan of care also following, appreciate assistance.  Patient has difficulty partaking with GOC discussions and adequate comprehension/concentration centered around discussions -Remaining full code and full scope for now but needs outpatient palliative care to continue following at discharge   Chronic hypoxic respiratory failure/COPD -Currently not on exacerbation.  On 3 L of oxygen  chronically at home.  Continue bronchodilators, inhalers  Acute on chronic normocytic anemia -Baseline hemoglobin around 7 - 8 g/dL - Admitted with  hemoglobin of 6.8 g/dL.  Likely associated with malignancy.  No evidence of acute blood loss.   - s/p 2 units PRBC since admission  -Continue ferrous sulfate .   Hypertension - Blood pressure is controlled -Takes metoprolol  at home.  Currently on hold   CAD Elevated troponin  -On aspirin , Lipitor , Repatha .  No anginal symptoms.  Takes aspirin  at home, currently on hold, will resume on discharge if no evidence of GI bleed. Denies any chest pain today.  Elevated troponin is likely from demand ischemia from anemia.   History of skin cancer -Underwent resection of the skin tumor on the left leg on October of this year.  Wound care consulted.  Wound appears to be slightly infected with purulent material.  Patient is on IV meropenem    History of recurrent UTI -On chronic therapy with nitrofurantoin . She has  noted frequency, dysuria symptoms.  Also complained of left lower quadrant pain.  Checking CT abdomen/pelvis without contrast.  Started on ceftriaxone .  Urine culture showing 30,000 colonies of Pseudomonas along with mixed organisms. - continue IV meropenem    Physical deconditioning -Patient lives with son but possibly in need of SNF - will ask for PT/OT to remain on team given no longer pursuing comfort care  Interval History:  No events overnight.  Remains somewhat confused but does engage in conversation just still has difficulty with concentration and remembering key details. No plans for ureteral stent placement today since renal function has improved.  Lovenox  also resumed due to this. Tentatively planning on repeating renal ultrasound on Friday and following up how she does with evaluations with PT/OT.   Antimicrobials: Rocephin  08/03/2024 >> 08/05/2024 Flagyl  08/04/2024 >> 08/08/2024 Meropenem  08/06/2024 >> 08/10/2024 Augmentin  08/10/2024 >> current  DVT prophylaxis:  SCDs Start: 08/03/24 1141   Code Status:   Code Status: Full Code  Mobility Assessment (Last 72 Hours)      Mobility Assessment     Row Name 08/10/24 1234 08/10/24 0959 08/09/24 2100 08/09/24 0851 08/08/24 1920   Does the patient have exclusion criteria? -- No- Perform mobility assessment No- Perform mobility assessment No- Perform mobility assessment No- Perform mobility assessment   What is the highest level of mobility based on the mobility assessment? Level 3 (Stands with assistance) - Balance while standing  and cannot march in place Level 3 (Stands with assistance) - Balance while standing  and cannot march in place Level 2 (Chairfast) - Balance while sitting on edge of bed and cannot stand Level 3 (Stands with assistance) - Balance while standing  and cannot march in place Level 3 (Stands with assistance) - Balance while standing  and cannot march in place   Is the above level different from baseline mobility prior to current illness? -- Yes - Recommend PT order Yes - Recommend PT order Yes - Recommend PT order Yes - Recommend PT order    Row Name 08/08/24 1055 08/07/24 2000         Does the patient have exclusion criteria? No- Perform mobility assessment No- Perform mobility assessment      What is the highest level of mobility based on the mobility assessment? Level 3 (Stands with assistance) - Balance while standing  and cannot march in place Level 3 (Stands with assistance) - Balance while standing  and cannot march in place      Is the above level different from baseline mobility prior to current illness? Yes - Recommend PT order Yes - Recommend PT order  Diet: Diet Orders (From admission, onward)     Start     Ordered   08/10/24 1108  DIET SOFT Room service appropriate? Yes; Fluid consistency: Thin  Diet effective now       Question Answer Comment  Room service appropriate? Yes   Fluid consistency: Thin      08/10/24 1107            Barriers to discharge: Patient changing mind regarding GOC Disposition Plan: TBD HH orders placed: TBD Status is:  Inpatient  Objective: Blood pressure (!) 127/41, pulse 76, temperature 97.6 F (36.4 C), resp. rate 16, height 5' 6 (1.676 m), weight 71.5 kg, SpO2 97%.  Examination:  Physical Exam Constitutional:      Appearance: Normal appearance.  HENT:     Head: Normocephalic and atraumatic.     Mouth/Throat:     Mouth: Mucous membranes are moist.  Eyes:     Extraocular Movements: Extraocular movements intact.  Cardiovascular:     Rate and Rhythm: Normal rate and regular rhythm.  Pulmonary:     Effort: Pulmonary effort is normal. No respiratory distress.     Breath sounds: Normal breath sounds. No wheezing.  Abdominal:     General: Bowel sounds are normal. There is no distension.     Palpations: Abdomen is soft.     Tenderness: There is no abdominal tenderness.  Musculoskeletal:        General: Normal range of motion.     Cervical back: Normal range of motion and neck supple.     Left lower leg: Edema (4+ pitting edema throughout LLE) present.  Skin:    General: Skin is warm and dry.  Neurological:     General: No focal deficit present.     Mental Status: She is alert.  Psychiatric:        Attention and Perception: She is inattentive.        Speech: Speech is tangential.        Cognition and Memory: Cognition is impaired. Memory is impaired. She exhibits impaired recent memory.      Consultants:  Palliative care General Surgery-signed off Urology  Procedures:    Data Reviewed: Results for orders placed or performed during the hospital encounter of 08/03/24 (from the past 24 hours)  Glucose, capillary     Status: Abnormal   Collection Time: 08/09/24  4:51 PM  Result Value Ref Range   Glucose-Capillary 165 (H) 70 - 99 mg/dL  Glucose, capillary     Status: Abnormal   Collection Time: 08/09/24  9:32 PM  Result Value Ref Range   Glucose-Capillary 136 (H) 70 - 99 mg/dL  Basic metabolic panel with GFR     Status: Abnormal   Collection Time: 08/10/24  4:03 AM  Result Value  Ref Range   Sodium 137 135 - 145 mmol/L   Potassium 4.8 3.5 - 5.1 mmol/L   Chloride 106 98 - 111 mmol/L   CO2 22 22 - 32 mmol/L   Glucose, Bld 107 (H) 70 - 99 mg/dL   BUN 52 (H) 8 - 23 mg/dL   Creatinine, Ser 8.13 (H) 0.44 - 1.00 mg/dL   Calcium  8.2 (L) 8.9 - 10.3 mg/dL   GFR, Estimated 26 (L) >60 mL/min   Anion gap 9 5 - 15  CBC with Differential/Platelet     Status: Abnormal   Collection Time: 08/10/24  4:03 AM  Result Value Ref Range   WBC 6.7 4.0 - 10.5 K/uL  RBC 3.13 (L) 3.87 - 5.11 MIL/uL   Hemoglobin 8.6 (L) 12.0 - 15.0 g/dL   HCT 71.0 (L) 63.9 - 53.9 %   MCV 92.3 80.0 - 100.0 fL   MCH 27.5 26.0 - 34.0 pg   MCHC 29.8 (L) 30.0 - 36.0 g/dL   RDW 82.3 (H) 88.4 - 84.4 %   Platelets 268 150 - 400 K/uL   nRBC 0.0 0.0 - 0.2 %   Neutrophils Relative % 76 %   Neutro Abs 5.2 1.7 - 7.7 K/uL   Band Neutrophils 2 %   Lymphocytes Relative 5 %   Lymphs Abs 0.3 (L) 0.7 - 4.0 K/uL   Monocytes Relative 2 %   Monocytes Absolute 0.1 0.1 - 1.0 K/uL   Eosinophils Relative 2 %   Eosinophils Absolute 0.1 0.0 - 0.5 K/uL   Basophils Relative 1 %   Basophils Absolute 0.1 0.0 - 0.1 K/uL   WBC Morphology See Note    Smear Review Normal platelet morphology    Metamyelocytes Relative 8 %   Myelocytes 4 %   Abs Immature Granulocytes 0.80 (H) 0.00 - 0.07 K/uL   Ovalocytes PRESENT   Magnesium      Status: None   Collection Time: 08/10/24  4:03 AM  Result Value Ref Range   Magnesium  2.2 1.7 - 2.4 mg/dL  Glucose, capillary     Status: Abnormal   Collection Time: 08/10/24  7:38 AM  Result Value Ref Range   Glucose-Capillary 106 (H) 70 - 99 mg/dL  Glucose, capillary     Status: Abnormal   Collection Time: 08/10/24 11:33 AM  Result Value Ref Range   Glucose-Capillary 111 (H) 70 - 99 mg/dL    I have reviewed pertinent nursing notes, vitals, labs, and images as necessary. I have ordered labwork to follow up on as indicated.  I have reviewed the last notes from staff over past 24 hours. I have  discussed patient's care plan and test results with nursing staff, CM/SW, and other staff as appropriate.  Old records reviewed in assessment of this patient  Time spent: Greater than 50% of the 55 minute visit was spent in counseling/coordination of care for the patient as laid out in the A&P.   LOS: 7 days   Alm Apo, MD [facility] 08/10/2024, 3:46 PM

## 2024-08-10 NOTE — Progress Notes (Signed)
 Subjective: Abigail [name] was alert and oriented up in bed on my arrival.  She was somewhat confused about the events leading up to today and we had a long clarifying conversation.  No acute events overnight  Objective: Vital signs in last 24 hours: Temp:  [97.8 F (36.6 C)-98.1 F (36.7 C)] 97.8 F (36.6 C) (12/17 0434) Pulse Rate:  [68-89] 68 (12/17 0434) Resp:  [14-20] 20 (12/17 0434) BP: (122-139)/(49-56) 122/49 (12/17 0434) SpO2:  [95 %-100 %] 95 % (12/17 0842)  Assessment/Plan: # Left hydronephrosis #AoCKD  Left hydroureteronephrosis extending down to the level of a contained abscess/perforation of diverticulitis.  Patient presently continuing conservatively with antibiotics.  General surgery has signed off  Interval improvement in serum creatinine this morning-1.86, baseline of around 1.6.  No acute indication for ureteral stent placement.  Hopefully the inflammation is subsiding as she continues on antibiotics and ureter will decompress naturally.  Will continue to follow her labs peripherally and consider stent placement should her renal function worsen acutely.  Will consider renal ultrasound on Friday if she is still in hospital. She is in agreement with this plan.  She had many questions regarding her DNR and ultimate goals of care.  She still has some conflicting feelings about focusing on comfort measures versus aggressive treatment.  Her two primary concerns are being able to return home to and spend time with her dogs, as well as file some insurance paperwork.  We had a long conversation and she does agree that she only wants a life that she can participate and engage in and is not interested in perpetuating her life if the quality is to be poor.  She does understand that she has numerous severe comorbidities.   Urology will follow peripherally for the time being.  Please call with questions, concerns, or acute urologic changes.   Intake/Output from previous day: 12/16  0701 - 12/17 0700 In: 760 [P.O.:560; IV Piggyback:200] Out: 550 [Urine:550]  Intake/Output this shift: No intake/output data recorded.  Physical Exam:  General: Decompensated, chronically ill, unresponsive CV: No cyanosis Lungs: equal chest rise   Lab Results: Recent Labs    08/08/24 1132 08/09/24 0418 08/10/24 0403  HGB 8.0* 8.3* 8.6*  HCT 26.7* 27.7* 28.9*   BMET Recent Labs    08/08/24 0343 08/08/24 1132 08/09/24 0418 08/10/24 0403  NA 136  --   --  137  K 4.8  --   --  4.8  CL 105  --   --  106  CO2 20*  --   --  22  GLUCOSE 113*  --   --  107*  BUN 56*  --   --  52*  CREATININE 2.37*  --   --  1.86*  CALCIUM  8.2*  --   --  8.2*  HGB  --    < > 8.3* 8.6*  WBC  --    < > 7.0 6.7   < > = values in this interval not displayed.     Studies/Results: VAS US  LOWER EXTREMITY VENOUS (DVT) Result Date: 08/08/2024  Lower Venous DVT Study Patient Name:  BATINA DOUGAN  Date of Exam:   08/08/2024 Medical Rec #: [id number]     Accession #:    [id number] Date of Birth: 1935/12/11     Patient Gender: F Patient Age:   88 years Exam Location:  [facility] Procedure:      VAS US  LOWER EXTREMITY VENOUS (DVT) Referring Phys: LEATRICE  OGBATA --------------------------------------------------------------------------------  Indications: Edema. Other Indications: Severe left-sided hydroureteronephrosis. Risk Factors: Cancer Metastatic non-small lung cancer to brain, on radiation. Recent surgery for skin cancer on left calf. Limitations: Poor ultrasound/tissue interface and Edema, bowel gas. Comparison Study: No prior study on file Performing Technologist: Alberta Lis RVS  Examination Guidelines: A complete evaluation includes B-mode imaging, spectral Doppler, color Doppler, and power Doppler as needed of all accessible portions of each vessel. Bilateral testing is considered an integral part of a complete examination. Limited examinations for reoccurring indications may be performed  as noted. The reflux portion of the exam is performed with the patient in reverse Trendelenburg.  +---------+---------------+---------+-----------+----------+-----------------+ RIGHT    CompressibilityPhasicitySpontaneityPropertiesThrombus Aging    +---------+---------------+---------+-----------+----------+-----------------+ CFV      Full           Yes      Yes                                    +---------+---------------+---------+-----------+----------+-----------------+ SFJ      Full                                                           +---------+---------------+---------+-----------+----------+-----------------+ FV Prox  Full           Yes      Yes                                    +---------+---------------+---------+-----------+----------+-----------------+ FV Mid   Full                                                           +---------+---------------+---------+-----------+----------+-----------------+ FV DistalFull                                                           +---------+---------------+---------+-----------+----------+-----------------+ PFV      Full           Yes      Yes                                    +---------+---------------+---------+-----------+----------+-----------------+ POP      None           No       No                   Age Indeterminate +---------+---------------+---------+-----------+----------+-----------------+ PTV      None                                         Age Indeterminate +---------+---------------+---------+-----------+----------+-----------------+ PERO     None  Age Indeterminate +---------+---------------+---------+-----------+----------+-----------------+   +---------+---------------+---------+-----------+----------+-------------------+ LEFT     CompressibilityPhasicitySpontaneityPropertiesThrombus Aging       +---------+---------------+---------+-----------+----------+-------------------+ CFV      None           No       No                   Acute               +---------+---------------+---------+-----------+----------+-------------------+ SFJ      None           No       No                   Acute               +---------+---------------+---------+-----------+----------+-------------------+ FV Prox  None           No       No                   Acute               +---------+---------------+---------+-----------+----------+-------------------+ FV Mid   None                                         Acute               +---------+---------------+---------+-----------+----------+-------------------+ FV DistalNone                                         Acute               +---------+---------------+---------+-----------+----------+-------------------+ PFV      None                                         Acute               +---------+---------------+---------+-----------+----------+-------------------+ POP      None           No       No                   Acute               +---------+---------------+---------+-----------+----------+-------------------+ PTV      None                                         Acute               +---------+---------------+---------+-----------+----------+-------------------+ PERO     None                                         Acute               +---------+---------------+---------+-----------+----------+-------------------+ Soleal   None  Acute               +---------+---------------+---------+-----------+----------+-------------------+ Gastroc  None                                         Acute               +---------+---------------+---------+-----------+----------+-------------------+ GSV      None                                         Acute in proximal                                                          portion             +---------+---------------+---------+-----------+----------+-------------------+ EIV                                                   Acute               +---------+---------------+---------+-----------+----------+-------------------+ IVC                                                   patent              +---------+---------------+---------+-----------+----------+-------------------+ Unable to visualized the common iliac veins secondary to bowel gas    Summary: RIGHT: - Findings consistent with age indeterminate deep vein thrombosis involving the right popliteal vein, right posterior tibial veins, and right peroneal veins.   LEFT: - Findings consistent with acute deep vein thrombosis involving the left common femoral vein, SF junction, left femoral vein, left proximal profunda vein, left popliteal vein, left posterior tibial veins, left peroneal veins, and External iliac vein. - Findings consistent with acute superficial vein thrombosis involving the left great saphenous vein. Findings consistent with acute intramuscular thrombosis involving the left gastrocnemius veins, and left soleal veins.  *See table(s) above for measurements and observations. Electronically signed by Fonda Rim on 08/08/2024 at 5:50:01 PM.    Final       LOS: 7 days   Ole Bourdon, NP [facility] Pager: [phone]  08/10/2024, 9:05 AM

## 2024-08-11 ENCOUNTER — Inpatient Hospital Stay (HOSPITAL_COMMUNITY)

## 2024-08-11 DIAGNOSIS — R531 Weakness: Secondary | ICD-10-CM | POA: Diagnosis not present

## 2024-08-11 DIAGNOSIS — N179 Acute kidney failure, unspecified: Secondary | ICD-10-CM | POA: Diagnosis not present

## 2024-08-11 DIAGNOSIS — K5720 Diverticulitis of large intestine with perforation and abscess without bleeding: Secondary | ICD-10-CM | POA: Diagnosis not present

## 2024-08-11 DIAGNOSIS — N3000 Acute cystitis without hematuria: Secondary | ICD-10-CM | POA: Diagnosis not present

## 2024-08-11 DIAGNOSIS — I824Y2 Acute embolism and thrombosis of unspecified deep veins of left proximal lower extremity: Secondary | ICD-10-CM | POA: Diagnosis not present

## 2024-08-11 DIAGNOSIS — N1832 Chronic kidney disease, stage 3b: Secondary | ICD-10-CM | POA: Diagnosis not present

## 2024-08-11 DIAGNOSIS — N1330 Unspecified hydronephrosis: Secondary | ICD-10-CM | POA: Diagnosis not present

## 2024-08-11 DIAGNOSIS — Z7189 Other specified counseling: Secondary | ICD-10-CM | POA: Diagnosis not present

## 2024-08-11 LAB — BASIC METABOLIC PANEL WITH GFR
Anion gap: 11 (ref 5–15)
BUN: 51 mg/dL — ABNORMAL HIGH (ref 8–23)
CO2: 21 mmol/L — ABNORMAL LOW (ref 22–32)
Calcium: 8.3 mg/dL — ABNORMAL LOW (ref 8.9–10.3)
Chloride: 106 mmol/L (ref 98–111)
Creatinine, Ser: 1.74 mg/dL — ABNORMAL HIGH (ref 0.44–1.00)
GFR, Estimated: 28 mL/min — ABNORMAL LOW (ref >60)
Glucose, Bld: 124 mg/dL — ABNORMAL HIGH (ref 70–99)
Potassium: 4.8 mmol/L (ref 3.5–5.1)
Sodium: 138 mmol/L (ref 135–145)

## 2024-08-11 LAB — GLUCOSE, CAPILLARY
Glucose-Capillary: 119 mg/dL — ABNORMAL HIGH (ref 70–99)
Glucose-Capillary: 196 mg/dL — ABNORMAL HIGH (ref 70–99)
Glucose-Capillary: 234 mg/dL — ABNORMAL HIGH (ref 70–99)
Glucose-Capillary: 240 mg/dL — ABNORMAL HIGH (ref 70–99)

## 2024-08-11 NOTE — Progress Notes (Signed)
 Physical Therapy Treatment Patient Details Name: Abigail [name] MRN: [id number] DOB: 1936/08/07 Today's Date: 08/11/2024   History of Present Illness Pt is an 88y.o female admitted on 08/03/24 with generalized weakness. She fell at home while trying to put her pants on and was unable to get up. PMH includes: metastatic non-small lung cancer to brain, currently on radiation treatment, chronic hypoxic respiratory failure on 3 L O2, CKD stage IIIb, 2 skin cancers on her legs s/p resection, hypertension, coronary artery disease, chronic normocytic anemia, hypothyroidism.    PT Comments  PT - Cognition Comments: AxO x 3 sweet Lady.  Max c/o weakness and fatigue after activity.  Remained on 3 lts.  Lives home with Gilmer Grice. LPT has rec HH PT.  Pt will need 24/7 Family Care as she is unsteady.   If plan is discharge home, recommend the following: A little help with walking and/or transfers;A little help with bathing/dressing/bathroom;Assist for transportation;Assistance with cooking/housework   Can travel by private vehicle     Yes  Equipment Recommendations  None recommended by PT    Recommendations for Other Services       Precautions / Restrictions Precautions Precautions: Fall Precaution/Restrictions Comments: O2 dep at baseline-3L Restrictions Weight Bearing Restrictions Per Provider Order: No     Mobility  Bed Mobility Overal bed mobility: Needs Assistance Bed Mobility: Sit to Supine       Sit to supine: Mod assist, Max assist   General bed mobility comments: assisted back to bed required Mod/Max Assist to lift B LE up into bed.  Then positioned to comfort and elevated B LE. Pt reports she sleeps on the couch at home.    Transfers Overall transfer level: Needs assistance   Transfers: Sit to/from Stand Sit to Stand: Min assist, Mod assist           General transfer comment: Pt required Min Assist to rise from recliner but needed greater assist Mod Assist to rise  from lower toilet level.  Also assisted with peri care due to impaired balance and B LE weakness/pain.    Ambulation/Gait Ambulation/Gait assistance: Min assist, Mod assist Gait Distance (Feet): 22 Feet (11 feet x 2 to and from bathroom) Assistive device: Rolling walker (2 wheels) Gait Pattern/deviations: Step-through pattern, Decreased stride length Gait velocity: decreased     General Gait Details: Very limited amb tolerance/distance 11 feet x 2 to and from bathroom.  Poor forward flexed posture.  Unsteady with turns and back steps.  Remained on 3 lts.   Stairs             Wheelchair Mobility     Tilt Bed    Modified Rankin (Stroke Patients Only)       Balance                                            Communication    Cognition Arousal: Alert Behavior During Therapy: WFL for tasks assessed/performed   PT - Cognitive impairments: No apparent impairments                       PT - Cognition Comments: AxO x 3 sweet Lady.  Max c/o weakness and fatigue after activity.  Remained on 3 lts.  Lives home with Gilmer Grice.        Cueing    Exercises  General Comments        Pertinent Vitals/Pain Pain Assessment Pain Assessment: Faces Pain Location: abdomen Pain Descriptors / Indicators: Tingling, Discomfort, Tightness Pain Intervention(s): Monitored during session    Home Living                          Prior Function            PT Goals (current goals can now be found in the care plan section) Progress towards PT goals: Progressing toward goals    Frequency    Min 2X/week      PT Plan      Co-evaluation              AM-PAC PT 6 Clicks Mobility   Outcome Measure  Help needed turning from your back to your side while in a flat bed without using bedrails?: A Little Help needed moving from lying on your back to sitting on the side of a flat bed without using bedrails?: A Little Help needed  moving to and from a bed to a chair (including a wheelchair)?: A Little Help needed standing up from a chair using your arms (e.g., wheelchair or bedside chair)?: A Lot Help needed to walk in hospital room?: A Lot Help needed climbing 3-5 steps with a railing? : A Lot 6 Click Score: 15    End of Session Equipment Utilized During Treatment: Gait belt;Oxygen  Activity Tolerance: Patient limited by fatigue Patient left: in bed;with call bell/phone within reach;with bed alarm set Nurse Communication: Mobility status PT Visit Diagnosis: Unsteadiness on feet (R26.81);Muscle weakness (generalized) (M62.81);Difficulty in walking, not elsewhere classified (R26.2)     Time: 1041-1106 PT Time Calculation (min) (ACUTE ONLY): 25 min  Charges:    $Gait Training: 8-22 mins $Therapeutic Activity: 8-22 mins PT General Charges $$ ACUTE PT VISIT: 1 Visit                     Katheryn Leap  PTA Acute  Rehabilitation Services Office M-F          [phone]

## 2024-08-11 NOTE — Plan of Care (Signed)
 Pts daughter Burnard and pts grandson have been with pt all day. Pt denies pain generalized weakness noted fissure noted on sacrum barrier cream applie d measured 1cmLx0.25 cm w

## 2024-08-11 NOTE — Progress Notes (Signed)
 Progress Note    Abigail [name]   FMW:[id number]  DOB: [birth year]  DOA: 08/03/2024     8 PCP: [name], FNP  Initial CC: Weakness  Hospital Course: Ms. Seim is an 88 year old female with PMH metastatic non-small lung cancer with mets to brain, currently on radiation treatment, chronic hypoxic respiratory failure on 3 L O2, CKD stage IIIb, 2 skin cancers on her legs s/p resection, HTN, CAD, COPD, chronic normocytic anemia and hypothyroidism.   Patient was admitted with generalized weakness and severe anemia, hemoglobin of 6.7 g/dL.  Patient has been transfused 2 units of packed red blood cells.  Weakness is thought to be multifactorial.   Workup revealed Pseudomonas UTI and sigmoid diverticulitis with presumed abscess concerning for focal/contained perforation.  General surgery also consulted on admission. Imaging studies also showed severe left hydronephrosis and hydroureter with ureter dilated to the level of the pelvis/area of inflammatory change at the sigmoid colon.  Creatinine also elevated at 2.2 on admission due to concern for bladder outlet obstruction. Lower extremity duplex also showed age-indeterminate DVT involving RLE and acute DVT involving LLE.   Assessment & Plan:   AKI on CKD stage IIIb BOO/Left hydronephrosis - Baseline creatinine around 1.6 - Admitted with serum creatinine of 2.2 -Poor oral intake prior to presentation - Renal ultrasound shows severe left hydronephrosis; also noted on CT was hydroureter with ureter dilated to the level of the pelvis/area of inflammatory change at the sigmoid - Urology following, appreciate assistance.  Tentative plan for potential stent placement if creatinine declines or fails to improve - Creatinine improved on 12/17, therefore urology holding off on stent placement at this time as showing improvement -Continuing with foley for now and probable repeat of renal ultrasound on Friday to re-evaluate everything   Diverticulitis with  abscess - CT on 08/04/2024 shows diffuse diverticular disease of the colon with presumed sigmoid diverticulitis and gas fluid collection involving left pelvic sidewall measuring 5.9 cm concerning for focal contained perforation or abscess - General Surgery following, no recommendations for intervention at this time.  Fluid collection is near iliac bone and not amenable for drainage - 2 weeks antibiotics recommended per surgery - transition abx to Augmentin  to complete course  - Diet has been advanced  Bilateral LE DVT - Significant edema noted in LLE and duplex shows acute DVT involving left common femoral vein, SF junction, femoral vein, proximal profunda vein, popliteal vein, posterior tibial vein, peroneal vein, external iliac vein - Age-indeterminate RLE DVT involving right popliteal, posterior tibial, peroneal veins - resume Lovenox  on 12/17 as no plans for stent/procedures  - likely Eliquis on 12/19 for ease of administration  LLE SVT - Duplex also noted superficial venous thrombosis involving left great saphenous vein and acute intramuscular thrombosis involving left gastrocnemius veins and left soleal veins  Metastatic non-small cell lung cancer with solitary cerebral brain metastasis -Follows with Dr. Sherrod and rad onc.  Completed concurrent chemoradiation and 1 year of Durvalumab  consolidation therapy.   - Currently on stereotactic radiosurgery for brain mets.  She completed the course of Decadron .   - Plan of care also following, appreciate assistance.  Patient has difficulty partaking with GOC discussions and adequate comprehension/concentration centered around discussions -Remaining full code and full scope for now but needs outpatient palliative care to continue following at discharge   Chronic hypoxic respiratory failure/COPD -Currently not on exacerbation.  On 3 L of oxygen  chronically at home.  Continue bronchodilators, inhalers  Acute on chronic normocytic  anemia -  Baseline hemoglobin around 7 - 8 g/dL - Admitted with hemoglobin of 6.8 g/dL.  Likely associated with malignancy.  No evidence of acute blood loss.   - s/p 2 units PRBC since admission  -Continue ferrous sulfate .   Hypertension - Blood pressure is controlled -Takes metoprolol  at home.  Currently on hold   CAD Elevated troponin  -On aspirin , Lipitor , Repatha .  No anginal symptoms.  Takes aspirin  at home, currently on hold, will resume on discharge if no evidence of GI bleed. Denies any chest pain today.  Elevated troponin is likely from demand ischemia from anemia.   History of skin cancer -Underwent resection of the skin tumor on the left leg on October of this year.  Wound care consulted.  Wound appears to be slightly infected with purulent material.  Patient is on IV meropenem    History of recurrent UTI -On chronic therapy with nitrofurantoin . She has  noted frequency, dysuria symptoms.  Also complained of left lower quadrant pain.  Checking CT abdomen/pelvis without contrast.  Started on ceftriaxone .  Urine culture showing 30,000 colonies of Pseudomonas along with mixed organisms. - continue IV meropenem    Physical deconditioning -Patient lives with son but possibly in need of SNF - will ask for PT/OT to remain on team given no longer pursuing comfort care  Interval History:  No events overnight. Going to repeat renal u/s tomorrow. Daughter arrived from [place] today and updated her.  Ideally patient needs ALF or LTC but not an option for them right now unfortunately.    Antimicrobials: Rocephin  08/03/2024 >> 08/05/2024 Flagyl  08/04/2024 >> 08/08/2024 Meropenem  08/06/2024 >> 08/10/2024 Augmentin  08/10/2024 >> current  DVT prophylaxis:  SCDs Start: 08/03/24 1141   Code Status:   Code Status: Full Code  Mobility Assessment (Last 72 Hours)     Mobility Assessment     Row Name 08/10/24 2100 08/10/24 1746 08/10/24 1234 08/10/24 0959 08/09/24 2100   Does the patient  have exclusion criteria? No- Perform mobility assessment -- -- No- Perform mobility assessment No- Perform mobility assessment   What is the highest level of mobility based on the mobility assessment? Level 2 (Chairfast) - Balance while sitting on edge of bed and cannot stand --  pt was seen at bed level Level 3 (Stands with assistance) - Balance while standing  and cannot march in place Level 3 (Stands with assistance) - Balance while standing  and cannot march in place Level 2 (Chairfast) - Balance while sitting on edge of bed and cannot stand   Is the above level different from baseline mobility prior to current illness? Yes - Recommend PT order -- -- Yes - Recommend PT order Yes - Recommend PT order    Row Name 08/09/24 0851 08/08/24 1920         Does the patient have exclusion criteria? No- Perform mobility assessment No- Perform mobility assessment      What is the highest level of mobility based on the mobility assessment? Level 3 (Stands with assistance) - Balance while standing  and cannot march in place Level 3 (Stands with assistance) - Balance while standing  and cannot march in place      Is the above level different from baseline mobility prior to current illness? Yes - Recommend PT order Yes - Recommend PT order         Diet: Diet Orders (From admission, onward)     Start     Ordered   08/12/24 0001  Diet NPO time specified Except  for: [employer], Sips with Meds  Diet effective midnight       Comments: Resume regular diet after renal ultrasound  Question Answer Comment  Except for Ice Chips   Except for Sips with Meds      08/11/24 1436   08/10/24 1108  DIET SOFT Room service appropriate? Yes; Fluid consistency: Thin  Diet effective now       Question Answer Comment  Room service appropriate? Yes   Fluid consistency: Thin      08/10/24 1107            Barriers to discharge: Patient changing mind regarding GOC Disposition Plan: TBD HH orders placed: TBD Status  is: Inpatient  Objective: Blood pressure (!) 140/52, pulse 89, temperature (!) 97.5 F (36.4 C), resp. rate 17, height 5' 6 (1.676 m), weight 71.5 kg, SpO2 100%.  Examination:  Physical Exam Constitutional:      Appearance: Normal appearance.  HENT:     Head: Normocephalic and atraumatic.     Mouth/Throat:     Mouth: Mucous membranes are moist.  Eyes:     Extraocular Movements: Extraocular movements intact.  Cardiovascular:     Rate and Rhythm: Normal rate and regular rhythm.  Pulmonary:     Effort: Pulmonary effort is normal. No respiratory distress.     Breath sounds: Normal breath sounds. No wheezing.  Abdominal:     General: Bowel sounds are normal. There is no distension.     Palpations: Abdomen is soft.     Tenderness: There is no abdominal tenderness.  Musculoskeletal:        General: Normal range of motion.     Cervical back: Normal range of motion and neck supple.     Left lower leg: Edema (4+ pitting edema throughout LLE) present.  Skin:    General: Skin is warm and dry.  Neurological:     General: No focal deficit present.     Mental Status: She is alert.  Psychiatric:        Attention and Perception: She is inattentive.        Speech: Speech is tangential.        Cognition and Memory: Cognition is impaired. Memory is impaired. She exhibits impaired recent memory.      Consultants:  Palliative care General Surgery-signed off Urology  Procedures:    Data Reviewed: Results for orders placed or performed during the hospital encounter of 08/03/24 (from the past 24 hours)  Glucose, capillary     Status: Abnormal   Collection Time: 08/10/24  5:20 PM  Result Value Ref Range   Glucose-Capillary 196 (H) 70 - 99 mg/dL  Glucose, capillary     Status: Abnormal   Collection Time: 08/10/24 10:12 PM  Result Value Ref Range   Glucose-Capillary 191 (H) 70 - 99 mg/dL  Basic metabolic panel with GFR     Status: Abnormal   Collection Time: 08/11/24  4:07 AM  Result  Value Ref Range   Sodium 138 135 - 145 mmol/L   Potassium 4.8 3.5 - 5.1 mmol/L   Chloride 106 98 - 111 mmol/L   CO2 21 (L) 22 - 32 mmol/L   Glucose, Bld 124 (H) 70 - 99 mg/dL   BUN 51 (H) 8 - 23 mg/dL   Creatinine, Ser 8.25 (H) 0.44 - 1.00 mg/dL   Calcium  8.3 (L) 8.9 - 10.3 mg/dL   GFR, Estimated 28 (L) >60 mL/min   Anion gap 11 5 - 15  Glucose,  capillary     Status: Abnormal   Collection Time: 08/11/24  7:55 AM  Result Value Ref Range   Glucose-Capillary 119 (H) 70 - 99 mg/dL  Glucose, capillary     Status: Abnormal   Collection Time: 08/11/24 11:39 AM  Result Value Ref Range   Glucose-Capillary 196 (H) 70 - 99 mg/dL    I have reviewed pertinent nursing notes, vitals, labs, and images as necessary. I have ordered labwork to follow up on as indicated.  I have reviewed the last notes from staff over past 24 hours. I have discussed patient's care plan and test results with nursing staff, CM/SW, and other staff as appropriate.  Old records reviewed in assessment of this patient  Time spent: Greater than 50% of the 55 minute visit was spent in counseling/coordination of care for the patient as laid out in the A&P.   LOS: 8 days   Alm Apo, MD [facility] 08/11/2024, 2:40 PM

## 2024-08-12 ENCOUNTER — Telehealth (HOSPITAL_COMMUNITY): Payer: Self-pay

## 2024-08-12 ENCOUNTER — Other Ambulatory Visit (HOSPITAL_COMMUNITY): Payer: Self-pay

## 2024-08-12 DIAGNOSIS — K5720 Diverticulitis of large intestine with perforation and abscess without bleeding: Secondary | ICD-10-CM | POA: Diagnosis not present

## 2024-08-12 DIAGNOSIS — R531 Weakness: Secondary | ICD-10-CM | POA: Diagnosis not present

## 2024-08-12 DIAGNOSIS — N179 Acute kidney failure, unspecified: Secondary | ICD-10-CM | POA: Diagnosis not present

## 2024-08-12 DIAGNOSIS — N3000 Acute cystitis without hematuria: Secondary | ICD-10-CM | POA: Diagnosis not present

## 2024-08-12 LAB — CBC WITH DIFFERENTIAL/PLATELET
Abs Immature Granulocytes: 0.72 K/uL — ABNORMAL HIGH (ref 0.00–0.07)
Basophils Absolute: 0.1 K/uL (ref 0.0–0.1)
Basophils Relative: 1 %
Eosinophils Absolute: 0.1 K/uL (ref 0.0–0.5)
Eosinophils Relative: 2 %
HCT: 25.8 % — ABNORMAL LOW (ref 36.0–46.0)
Hemoglobin: 7.7 g/dL — ABNORMAL LOW (ref 12.0–15.0)
Immature Granulocytes: 10 %
Lymphocytes Relative: 5 %
Lymphs Abs: 0.4 K/uL — ABNORMAL LOW (ref 0.7–4.0)
MCH: 27.5 pg (ref 26.0–34.0)
MCHC: 29.8 g/dL — ABNORMAL LOW (ref 30.0–36.0)
MCV: 92.1 fL (ref 80.0–100.0)
Monocytes Absolute: 0.7 K/uL (ref 0.1–1.0)
Monocytes Relative: 10 %
Neutro Abs: 5.5 K/uL (ref 1.7–7.7)
Neutrophils Relative %: 72 %
Platelets: 264 K/uL (ref 150–400)
RBC: 2.8 MIL/uL — ABNORMAL LOW (ref 3.87–5.11)
RDW: 17.5 % — ABNORMAL HIGH (ref 11.5–15.5)
Smear Review: NORMAL
WBC: 7.4 K/uL (ref 4.0–10.5)
nRBC: 0 % (ref 0.0–0.2)

## 2024-08-12 LAB — BASIC METABOLIC PANEL WITH GFR
Anion gap: 9 (ref 5–15)
BUN: 43 mg/dL — ABNORMAL HIGH (ref 8–23)
CO2: 22 mmol/L (ref 22–32)
Calcium: 8.2 mg/dL — ABNORMAL LOW (ref 8.9–10.3)
Chloride: 108 mmol/L (ref 98–111)
Creatinine, Ser: 1.58 mg/dL — ABNORMAL HIGH (ref 0.44–1.00)
GFR, Estimated: 32 mL/min — ABNORMAL LOW
Glucose, Bld: 138 mg/dL — ABNORMAL HIGH (ref 70–99)
Potassium: 4.6 mmol/L (ref 3.5–5.1)
Sodium: 138 mmol/L (ref 135–145)

## 2024-08-12 LAB — GLUCOSE, CAPILLARY
Glucose-Capillary: 122 mg/dL — ABNORMAL HIGH (ref 70–99)
Glucose-Capillary: 129 mg/dL — ABNORMAL HIGH (ref 70–99)

## 2024-08-12 MED ORDER — LOPERAMIDE HCL 2 MG PO CAPS
2.0000 mg | ORAL_CAPSULE | ORAL | Status: DC | PRN
  Administered 2024-08-12 (×2): 2 mg via ORAL
  Filled 2024-08-12 (×2): qty 1

## 2024-08-12 MED ORDER — APIXABAN 5 MG PO TABS
5.0000 mg | ORAL_TABLET | Freq: Two times a day (BID) | ORAL | Status: DC
  Administered 2024-08-13 – 2024-08-22 (×19): 5 mg via ORAL
  Filled 2024-08-12 (×19): qty 1

## 2024-08-12 NOTE — TOC Progression Note (Signed)
 Transition of Care [facility]) - Progression Note    Patient Details  Name: Abigail [name] MRN: [id number] Date of Birth: 17-Jul-1936  [facility] CM/SW Contact  Tawni CHRISTELLA Eva, LCSW Phone Number: 08/12/2024, 3:42 PM  Clinical Narrative:    CSW spoke with pt and pt's daughter to discuss. Recommendations for SNF placement, she has agreed. Pt's daughter is requesting [facility]. CSW explained the process and noting pt will need insurance auth. CSW to fax out pt for SNF. ICM to follow.    Expected Discharge Plan: Home w Home Health Services Barriers to Discharge: Continued Medical Work up               Expected Discharge Plan and Services       Living arrangements for the past 2 months: Single Family Home                                       Social Drivers of Health (SDOH) Interventions SDOH Screenings   Food Insecurity: No Food Insecurity (08/03/2024)  Housing: Low Risk (08/03/2024)  Transportation Needs: No Transportation Needs (08/03/2024)  Utilities: Not At Risk (08/03/2024)  Alcohol Screen: Low Risk (03/09/2024)  Depression (PHQ2-9): Low Risk (07/01/2024)  Financial Resource Strain: Medium Risk (03/09/2024)  Physical Activity: Inactive (03/09/2024)  Social Connections: Moderately Isolated (08/03/2024)  Stress: No Stress Concern Present (03/09/2024)  Recent Concern: Stress - Stress Concern Present (03/09/2024)  Tobacco Use: Medium Risk (08/05/2024)  Health Literacy: Adequate Health Literacy (03/09/2024)    Readmission Risk Interventions    06/07/2024    3:57 PM  Readmission Risk Prevention Plan  Transportation Screening Complete  PCP or Specialist Appt within 3-5 Days Complete  HRI or Home Care Consult Complete  Social Work Consult for Recovery Care Planning/Counseling Complete  Palliative Care Screening Complete  Medication Review Oceanographer) Referral to Pharmacy

## 2024-08-12 NOTE — Progress Notes (Signed)
 Physical Therapy Treatment Patient Details Name: Abigail [name] MRN: [id number] DOB: 1935-11-19 Today's Date: 08/12/2024   History of Present Illness Pt is an 88y.o female admitted on 08/03/24 with generalized weakness. She fell at home while trying to put her pants on and was unable to get up. PMH includes: metastatic non-small lung cancer to brain, currently on radiation treatment, chronic hypoxic respiratory failure on 3 L O2, CKD stage IIIb, 2 skin cancers on her legs s/p resection, hypertension, coronary artery disease, chronic normocytic anemia, hypothyroidism.    PT Comments  PT - Cognition Comments: AxO x 3 sweet Lady.  Max c/o weakness and fatigue after activity.  Remained on 3 lts.  Lives home with Gilmer Grice.  Pt OOB in recliner on 3 lts sats at 96%.  Per chart review and Team chat, family seeking ST Rehab at SNF before D/C back home with Son.  Prior to admit (info taken from her Evaluation) Living Arrangements: Children (son, Grice) Available Help at Discharge: Family;Available PRN/intermittently (pt home alone during day as son works) Type of Home: House Home Access: Stairs to enter Entrance Stairs-Rails: None Secretary/administrator of Steps: 1 Home Layout: One level Home Equipment: Shower seat;Grab bars - tub/shower;Hand held shower head;Toilet riser;Rollator (4 wheels);Cane - quad Additional Comments: on 3L O2 at baseline however removes to walk to bathroom. Pt reports that son isn't able to reliably help her and they take care of their own things at home regarding IADLs   Prior Level of Function : Independent/Modified Independent Mobility Comments: walks with rollator, on 3L O2 24/7 but then reports taking it off to go to the bathroom as she has tripped over O2 line when in bathroom before, denies falls in past 6 months ADLs Comments: doesn't drive; independent with showering using shower seat    Pt would benefit from [facility] to attempt to regain her prior level of mobility  and functional skills.  Will update LPT.   If plan is discharge home, recommend the following: A little help with walking and/or transfers;A little help with bathing/dressing/bathroom;Assist for transportation;Assistance with cooking/housework   Can travel by private vehicle     No  Equipment Recommendations  None recommended by PT    Recommendations for Other Services OT consult     Precautions / Restrictions Precautions Precautions: Fall Precaution/Restrictions Comments: O2 dep at baseline-3L Restrictions Weight Bearing Restrictions Per Provider Order: No     Mobility  Bed Mobility               General bed mobility comments: OOB in recliner    Transfers Overall transfer level: Needs assistance Equipment used: Rolling walker (2 wheels) Transfers: Sit to/from Stand Sit to Stand: Min assist, Mod assist, Max assist           General transfer comment: Pt required any where from Min to Max  Assist to rise from recliner pending fatigue level, surface height and time of day.  Max VC's on safety with turns as Pt tends to sit quickly due to c/o B LE weakness and averall fatigue.    Ambulation/Gait Ambulation/Gait assistance: Mod assist Gait Distance (Feet): 10 Feet Assistive device: Rolling walker (2 wheels) Gait Pattern/deviations: Step-through pattern, Decreased stride length Gait velocity: decreased     General Gait Details: Very limited amb tolerance/distance 10 feet x 2 due to increassed c/o B LE weakness and fatigue.  Poor forward flexed posture.  Unsteady with turns and back steps.  Remained on 3 lts.  HIGH FALL  RISK.   Stairs             Wheelchair Mobility     Tilt Bed    Modified Rankin (Stroke Patients Only)       Balance                                            Communication Communication Communication: No apparent difficulties  Cognition Arousal: Alert Behavior During Therapy: WFL for tasks  assessed/performed   PT - Cognitive impairments: No apparent impairments                       PT - Cognition Comments: AxO x 3 sweet Lady.  Max c/o weakness and fatigue after activity.  Remained on 3 lts.  Lives home with Gilmer Grice. Following commands: Intact Following commands impaired: Only follows one step commands consistently    Cueing Cueing Techniques: Verbal cues  Exercises      General Comments        Pertinent Vitals/Pain Pain Assessment Pain Assessment: Faces Faces Pain Scale: Hurts a little bit Pain Location: abdomen Pain Descriptors / Indicators: Discomfort Pain Intervention(s): Monitored during session    Home Living                          Prior Function            PT Goals (current goals can now be found in the care plan section) Progress towards PT goals: Progressing toward goals    Frequency    Min 2X/week      PT Plan      Co-evaluation              AM-PAC PT 6 Clicks Mobility   Outcome Measure  Help needed turning from your back to your side while in a flat bed without using bedrails?: A Lot Help needed moving from lying on your back to sitting on the side of a flat bed without using bedrails?: A Lot Help needed moving to and from a bed to a chair (including a wheelchair)?: A Lot Help needed standing up from a chair using your arms (e.g., wheelchair or bedside chair)?: A Lot Help needed to walk in hospital room?: A Lot Help needed climbing 3-5 steps with a railing? : Total 6 Click Score: 11    End of Session Equipment Utilized During Treatment: Gait belt;Oxygen  Activity Tolerance: Patient limited by fatigue Patient left: in chair;with call bell/phone within reach;with chair alarm set;with family/visitor present Nurse Communication: Mobility status PT Visit Diagnosis: Unsteadiness on feet (R26.81);Muscle weakness (generalized) (M62.81);Difficulty in walking, not elsewhere classified (R26.2)     Time:  8852-8791 PT Time Calculation (min) (ACUTE ONLY): 21 min  Charges:    $Gait Training: 8-22 mins PT General Charges $$ ACUTE PT VISIT: 1 Visit                     Katheryn Leap  PTA Acute  Rehabilitation Services Office M-F          [phone]

## 2024-08-12 NOTE — Progress Notes (Signed)
 " Progress Note    Abigail [name]   FMW:[id number]  DOB: 14-Jan-1936  DOA: 08/03/2024     9 PCP: [name], FNP  Initial CC: Weakness  Hospital Course: Ms. Trovato is an 88 year old female with PMH metastatic non-small lung cancer with mets to brain, currently on radiation treatment, chronic hypoxic respiratory failure on 3 L O2, CKD stage IIIb, 2 skin cancers on her legs s/p resection, HTN, CAD, COPD, chronic normocytic anemia and hypothyroidism.   Patient was admitted with generalized weakness and severe anemia, hemoglobin of 6.7 g/dL.  Patient has been transfused 2 units of packed red blood cells.  Weakness is thought to be multifactorial.   Workup revealed Pseudomonas UTI and sigmoid diverticulitis with presumed abscess concerning for focal/contained perforation.  General surgery also consulted on admission. Imaging studies also showed severe left hydronephrosis and hydroureter with ureter dilated to the level of the pelvis/area of inflammatory change at the sigmoid colon.  Creatinine also elevated at 2.2 on admission due to concern for bladder outlet obstruction. Lower extremity duplex also showed age-indeterminate DVT involving RLE and acute DVT involving LLE.   Assessment & Plan:   AKI on CKD stage IIIb BOO/Left hydronephrosis - Baseline creatinine around 1.6 - Admitted with serum creatinine of 2.2 -Poor oral intake prior to presentation - Renal ultrasound shows severe left hydronephrosis; also noted on CT was hydroureter with ureter dilated to the level of the pelvis/area of inflammatory change at the sigmoid - Urology following, appreciate assistance.  Tentative plan for potential stent placement if creatinine declines or fails to improve - Creatinine improved on 12/17, therefore urology holding off on stent placement at this time as showing improvement - Renal ultrasound repeated on 08/11/2024.  Moderate to severe but stable left hydronephrosis; renal function continues to improve  as well - Discussed with urology.  Trial of void with removing Foley today -Eventual outpatient follow-up with urology for repeat imaging again   Diverticulitis with abscess - CT on 08/04/2024 shows diffuse diverticular disease of the colon with presumed sigmoid diverticulitis and gas fluid collection involving left pelvic sidewall measuring 5.9 cm concerning for focal contained perforation or abscess - General Surgery following, no recommendations for intervention at this time.  Fluid collection is near iliac bone and not amenable for drainage - 2 weeks antibiotics recommended per surgery - transition abx to Augmentin  to complete course  - Diet has been advanced  Bilateral LE DVT - Significant edema noted in LLE and duplex shows acute DVT involving left common femoral vein, SF junction, femoral vein, proximal profunda vein, popliteal vein, posterior tibial vein, peroneal vein, external iliac vein - Age-indeterminate RLE DVT involving right popliteal, posterior tibial, peroneal veins - resume Lovenox  on 12/17 as no plans for stent/procedures  - transition to Eliquis on 12/19 for ease of administration; poor candidate for Lovenox  at home due to cognitive issues  LLE SVT - Duplex also noted superficial venous thrombosis involving left great saphenous vein and acute intramuscular thrombosis involving left gastrocnemius veins and left soleal veins  Metastatic non-small cell lung cancer with solitary cerebral brain metastasis -Follows with Dr. Sherrod and rad onc.  Completed concurrent chemoradiation and 1 year of Durvalumab  consolidation therapy.   - Currently on stereotactic radiosurgery for brain mets.  She completed the course of Decadron .   - Plan of care also following, appreciate assistance.  Patient has difficulty partaking with GOC discussions and adequate comprehension/concentration centered around discussions -Remaining full code and full scope for now but needs  outpatient palliative  care to continue following at discharge   Chronic hypoxic respiratory failure/COPD -Currently not on exacerbation.  On 3 L of oxygen  chronically at home.  Continue bronchodilators, inhalers  Acute on chronic normocytic anemia -Baseline hemoglobin around 7 - 8 g/dL - Admitted with hemoglobin of 6.8 g/dL.  Likely associated with malignancy.  No evidence of acute blood loss.   - s/p 2 units PRBC since admission  -Continue ferrous sulfate .   Hypertension - Blood pressure is controlled -Takes metoprolol  at home.  Currently on hold   CAD Elevated troponin  -On aspirin , Lipitor , Repatha .  No anginal symptoms.  Takes aspirin  at home, currently on hold, will resume on discharge if no evidence of GI bleed. Denies any chest pain today.  Elevated troponin is likely from demand ischemia from anemia.   History of skin cancer -Underwent resection of the skin tumor on the left leg on October of this year.  Wound care consulted.  Wound appears to be slightly infected with purulent material.  Patient is on IV meropenem    History of recurrent UTI -On chronic therapy with nitrofurantoin . She has  noted frequency, dysuria symptoms.  Also complained of left lower quadrant pain.  Checking CT abdomen/pelvis without contrast.  Started on ceftriaxone .  Urine culture showing 30,000 colonies of Pseudomonas along with mixed organisms. - continue IV meropenem    Physical deconditioning -Patient lives with son but possibly in need of SNF - will ask for PT/OT to remain on team given no longer pursuing comfort care  Interval History:  No events overnight.  She is now amenable with rehab placement.  Daughter present bedside this morning as well. Undergoing repeat evaluations today with PT and OT.   Antimicrobials: Rocephin  08/03/2024 >> 08/05/2024 Flagyl  08/04/2024 >> 08/08/2024 Meropenem  08/06/2024 >> 08/10/2024 Augmentin  08/10/2024 >> current  DVT prophylaxis:  SCDs Start: 08/03/24 1141   Code Status:    Code Status: Full Code  Mobility Assessment (Last 72 Hours)     Mobility Assessment     Row Name 08/12/24 1236 08/12/24 0835 08/11/24 2200 08/11/24 0850 08/10/24 2100   Does the patient have exclusion criteria? -- No- Perform mobility assessment No- Perform mobility assessment No- Perform mobility assessment No- Perform mobility assessment   What is the highest level of mobility based on the mobility assessment? Level 4 (Ambulates with assistance) - Balance while stepping forward/back - Complete Level 2 (Chairfast) - Balance while sitting on edge of bed and cannot stand Level 2 (Chairfast) - Balance while sitting on edge of bed and cannot stand Level 2 (Chairfast) - Balance while sitting on edge of bed and cannot stand Level 2 (Chairfast) - Balance while sitting on edge of bed and cannot stand   Is the above level different from baseline mobility prior to current illness? -- Yes - Recommend PT order Yes - Recommend PT order Yes - Recommend PT order Yes - Recommend PT order    Row Name 08/10/24 1746 08/10/24 1234 08/10/24 0959 08/09/24 2100     Does the patient have exclusion criteria? -- -- No- Perform mobility assessment No- Perform mobility assessment    What is the highest level of mobility based on the mobility assessment? --  pt was seen at bed level Level 3 (Stands with assistance) - Balance while standing  and cannot march in place Level 3 (Stands with assistance) - Balance while standing  and cannot march in place Level 2 (Chairfast) - Balance while sitting on edge of bed and cannot  stand    Is the above level different from baseline mobility prior to current illness? -- -- Yes - Recommend PT order Yes - Recommend PT order       Diet: Diet Orders (From admission, onward)     Start     Ordered   08/12/24 0732  Diet regular Room service appropriate? Yes; Fluid consistency: Thin  Diet effective now       Question Answer Comment  Room service appropriate? Yes   Fluid consistency: Thin       08/12/24 0731            Barriers to discharge: Patient changing mind regarding GOC Disposition Plan: TBD HH orders placed: TBD Status is: Inpatient  Objective: Blood pressure (!) 113/45, pulse 76, temperature 98.1 F (36.7 C), temperature source Oral, resp. rate 17, height 5' 6 (1.676 m), weight 71.5 kg, SpO2 96%.  Examination:  Physical Exam Constitutional:      Appearance: Normal appearance.  HENT:     Head: Normocephalic and atraumatic.     Mouth/Throat:     Mouth: Mucous membranes are moist.  Eyes:     Extraocular Movements: Extraocular movements intact.  Cardiovascular:     Rate and Rhythm: Normal rate and regular rhythm.  Pulmonary:     Effort: Pulmonary effort is normal. No respiratory distress.     Breath sounds: Normal breath sounds. No wheezing.  Abdominal:     General: Bowel sounds are normal. There is no distension.     Palpations: Abdomen is soft.     Tenderness: There is no abdominal tenderness.  Musculoskeletal:        General: Normal range of motion.     Cervical back: Normal range of motion and neck supple.     Left lower leg: Edema (4+ pitting edema throughout LLE) present.  Skin:    General: Skin is warm and dry.  Neurological:     General: No focal deficit present.     Mental Status: She is alert.  Psychiatric:        Attention and Perception: She is inattentive.        Speech: Speech is tangential.        Cognition and Memory: Cognition is impaired. Memory is impaired. She exhibits impaired recent memory.      Consultants:  Palliative care General Surgery-signed off Urology  Procedures:    Data Reviewed: Results for orders placed or performed during the hospital encounter of 08/03/24 (from the past 24 hours)  Glucose, capillary     Status: Abnormal   Collection Time: 08/11/24  4:20 PM  Result Value Ref Range   Glucose-Capillary 234 (H) 70 - 99 mg/dL  Glucose, capillary     Status: Abnormal   Collection Time: 08/11/24  8:01  PM  Result Value Ref Range   Glucose-Capillary 240 (H) 70 - 99 mg/dL  Basic metabolic panel with GFR     Status: Abnormal   Collection Time: 08/12/24  4:16 AM  Result Value Ref Range   Sodium 138 135 - 145 mmol/L   Potassium 4.6 3.5 - 5.1 mmol/L   Chloride 108 98 - 111 mmol/L   CO2 22 22 - 32 mmol/L   Glucose, Bld 138 (H) 70 - 99 mg/dL   BUN 43 (H) 8 - 23 mg/dL   Creatinine, Ser 8.41 (H) 0.44 - 1.00 mg/dL   Calcium  8.2 (L) 8.9 - 10.3 mg/dL   GFR, Estimated 32 (L) >60 mL/min   Anion gap  9 5 - 15  CBC with Differential/Platelet     Status: Abnormal   Collection Time: 08/12/24  4:16 AM  Result Value Ref Range   WBC 7.4 4.0 - 10.5 K/uL   RBC 2.80 (L) 3.87 - 5.11 MIL/uL   Hemoglobin 7.7 (L) 12.0 - 15.0 g/dL   HCT 74.1 (L) 63.9 - 53.9 %   MCV 92.1 80.0 - 100.0 fL   MCH 27.5 26.0 - 34.0 pg   MCHC 29.8 (L) 30.0 - 36.0 g/dL   RDW 82.4 (H) 88.4 - 84.4 %   Platelets 264 150 - 400 K/uL   nRBC 0.0 0.0 - 0.2 %   Neutrophils Relative % 72 %   Neutro Abs 5.5 1.7 - 7.7 K/uL   Lymphocytes Relative 5 %   Lymphs Abs 0.4 (L) 0.7 - 4.0 K/uL   Monocytes Relative 10 %   Monocytes Absolute 0.7 0.1 - 1.0 K/uL   Eosinophils Relative 2 %   Eosinophils Absolute 0.1 0.0 - 0.5 K/uL   Basophils Relative 1 %   Basophils Absolute 0.1 0.0 - 0.1 K/uL   WBC Morphology See Note    Smear Review Normal platelet morphology    Immature Granulocytes 10 %   Abs Immature Granulocytes 0.72 (H) 0.00 - 0.07 K/uL   Ovalocytes PRESENT   Glucose, capillary     Status: Abnormal   Collection Time: 08/12/24  7:31 AM  Result Value Ref Range   Glucose-Capillary 122 (H) 70 - 99 mg/dL   Comment 1 QC Due   Glucose, capillary     Status: Abnormal   Collection Time: 08/12/24 11:14 AM  Result Value Ref Range   Glucose-Capillary 129 (H) 70 - 99 mg/dL    I have reviewed pertinent nursing notes, vitals, labs, and images as necessary. I have ordered labwork to follow up on as indicated.  I have reviewed the last notes from  staff over past 24 hours. I have discussed patient's care plan and test results with nursing staff, CM/SW, and other staff as appropriate.  Old records reviewed in assessment of this patient  Time spent: Greater than 50% of the 55 minute visit was spent in counseling/coordination of care for the patient as laid out in the A&P.   LOS: 9 days   Alm Apo, MD [facility] 08/12/2024, 1:06 PM "

## 2024-08-12 NOTE — NC FL2 (Cosign Needed)
 " [place]  MEDICAID FL2 LEVEL OF CARE FORM     IDENTIFICATION  Patient Name: Abigail [name] Birthdate: [birth year] Sex: female Admission Date (Current Location): 08/03/2024  [facility] and [place] Number:  Producer, Television/film/video and Address:  [facility],  [address], [place] [address]      Provider Number: [id number]  Attending Physician Name and Address:  Patsy Lenis, MD  Relative Name and Phone Number:       Current Level of Care: Hospital Recommended Level of Care: Skilled Nursing Facility Prior Approval Number:    Date Approved/Denied:   PASRR Number: [id number]  Discharge Plan: SNF    Current Diagnoses: Patient Active Problem List   Diagnosis Date Noted   Malignant neoplasm metastatic to brain [facility]) 08/09/2024   Counseling and coordination of care 08/09/2024   Goals of care, counseling/discussion 08/09/2024   Palliative care encounter 08/09/2024   Diverticulitis of large intestine with abscess 08/09/2024   Hydroureteronephrosis 08/09/2024   Acute deep vein thrombosis (DVT) of left lower extremity (HCC) 08/09/2024   Chronic deep vein thrombosis (DVT) of right lower extremity (HCC) 08/09/2024   Acute renal failure superimposed on stage 3b chronic kidney disease (HCC) 08/03/2024   Secondary malignant neoplasm of brain (HCC) 07/18/2024   Wound disruption 07/17/2024   Non-small cell lung cancer (HCC) 07/17/2024   Need for influenza vaccination 07/17/2024   Nausea & vomiting 06/06/2024   Brain mass 06/06/2024   Dysuria 05/19/2024   History of UTI 03/09/2024   Acute cystitis 01/29/2024   History of CAD (coronary artery disease) 01/29/2024   History of COPD 01/29/2024   Chronic weakness of both lower extremities 01/29/2024   QT prolongation 01/29/2024   COVID-19 vaccination declined 12/22/2023   Chest pain 10/11/2023   Overactive bladder [N32.81] 08/24/2023   Stage 3b chronic kidney disease (HCC) [N18.32] 08/24/2023   Hypertension with renal  disease 08/24/2023   Foul smelling urine 06/21/2023   Hematuria 06/21/2023   Closed fracture of left upper limb 06/21/2023   Asymptomatic microscopic hematuria 04/08/2023   Right wrist pain 02/25/2023   Supplemental oxygen  dependent 02/25/2023   Chronic pain of both knees 02/25/2023   Atherosclerosis of aorta 02/25/2023   Hiatal hernia 11/20/2022   Physical deconditioning 11/20/2022   Chronic cough 10/22/2022   Nodule of kidney 09/15/2022   Left ureteral stone 09/07/2022   Gross hematuria 09/07/2022   Chronic hypoxic respiratory failure (HCC) 09/07/2022   Nephrolithiasis 09/07/2022   Staghorn calculus 09/07/2022   Chronic iron  deficiency anemia 04/21/2022   Symptomatic anemia 02/13/2022   Encounter for antineoplastic immunotherapy 11/20/2021   COPD (chronic obstructive pulmonary disease) (HCC) 08/20/2021   Dysphagia 08/20/2021   H/O: lung cancer 07/31/2021   Malignant neoplasm of unspecified part of unspecified bronchus or lung (HCC) 07/25/2021   Encounter for antineoplastic chemotherapy 07/25/2021   Mediastinal adenopathy 07/15/2021   Coronary artery disease of native artery of native heart with stable angina pectoris 04/11/2021   Right bundle branch block 04/11/2021   Newly recognized heart murmur 04/11/2021   Chest pain with moderate risk for cardiac etiology 12/01/2019   Urinary urgency 12/01/2019   Anxiety 02/09/2019   Prediabetes 12/29/2018   Essential hypertension 08/27/2018   Vitamin D  deficiency 06/25/2018   Hyperlipidemia 06/25/2018   Hypothyroidism 06/25/2018   CKD (chronic kidney disease), stage III (HCC) 06/25/2018   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 stage 3b chronic kidney disease (HCC) 06/25/2018   OAB (  overactive bladder) 08/26/2016    Orientation RESPIRATION BLADDER Height & Weight     Self, Time, Situation, Place  Normal (2L) Incontinent Weight: 157 lb 10.1 oz (71.5 kg) Height:  5' 6  (167.6 cm)  BEHAVIORAL SYMPTOMS/MOOD NEUROLOGICAL BOWEL NUTRITION STATUS      Incontinent Diet (regaular)  AMBULATORY STATUS COMMUNICATION OF NEEDS Skin   Limited Assist Verbally Normal                       Personal Care Assistance Level of Assistance  Feeding, Dressing, Bathing Bathing Assistance: Limited assistance Feeding assistance: Independent Dressing Assistance: Limited assistance     Functional Limitations Info  Sight, Hearing, Speech Sight Info: Adequate Hearing Info: Adequate Speech Info: Adequate    SPECIAL CARE FACTORS FREQUENCY  PT (By licensed PT), OT (By licensed OT)     PT Frequency: 5 x a week OT Frequency: 5 x a week            Contractures Contractures Info: Not present    Additional Factors Info  Code Status, Allergies Code Status Info: full Allergies Info: Codeine,Norvasc  (amlodipine )           Current Medications (08/12/2024):  This is the current hospital active medication list Current Facility-Administered Medications  Medication Dose Route Frequency Provider Last Rate Last Admin   acetaminophen  (TYLENOL ) tablet 500 mg  500 mg Oral Q6H PRN Jillian Buttery, MD   500 mg at 08/12/24 1138   albuterol  (PROVENTIL ) (2.5 MG/3ML) 0.083% nebulizer solution 2.5 mg  2.5 mg Inhalation Q6H PRN Jillian Buttery, MD       amoxicillin -clavulanate (AUGMENTIN ) 500-125 MG per tablet 1 tablet  1 tablet Oral BID Patsy Lenis, MD   1 tablet at 08/12/24 0823   [START ON 08/13/2024] apixaban (ELIQUIS) tablet 5 mg  5 mg Oral BID Britta Soulier M, RPH       budesonide -glycopyrrolate -formoterol  (BREZTRI ) 160-9-4.8 MCG/ACT inhaler 2 puff  2 puff Inhalation BID Rosario Eland I, MD   2 puff at 08/12/24 9175   Chlorhexidine  Gluconate Cloth 2 % PADS 6 each  6 each Topical Daily Donati-Garmon, Natalie M, NP   6 each at 08/12/24 9175   ferrous sulfate  tablet 325 mg  325 mg Oral Q breakfast Jillian Buttery, MD   325 mg at 08/12/24 9176   HYDROmorphone  (DILAUDID )  injection 0.5 mg  0.5 mg Intravenous Q2H PRN Anwar, Zeba, MD       levothyroxine  (SYNTHROID ) tablet 175 mcg  175 mcg Oral Once per day on Monday Tuesday Wednesday Thursday Jillian Buttery, MD   175 mcg at 08/11/24 9385   loperamide (IMODIUM) capsule 2 mg  2 mg Oral Q4H PRN Patsy Lenis, MD   2 mg at 08/12/24 1141   metoprolol  tartrate (LOPRESSOR ) injection 2.5 mg  2.5 mg Intravenous Once PRN Daniels, James K, NP       Oral care mouth rinse  15 mL Mouth Rinse PRN Rosario Eland FERNS, MD       pantoprazole  (PROTONIX ) EC tablet 40 mg  40 mg Oral Daily Adhikari, Amrit, MD   40 mg at 08/12/24 9176   polyethylene glycol (MIRALAX  / GLYCOLAX ) packet 17 g  17 g Oral BID Tammy Sor, PA-C   17 g at 08/11/24 2158   protein supplement (ENSURE MAX) liquid  11 oz Oral BID BM Rosario Eland I, MD   11 oz at 08/12/24 1426     Discharge Medications: Please see discharge summary for a list of  discharge medications.  Relevant Imaging Results:  Relevant Lab Results:   Additional Information [id number]  Tawni HERO Addy Mcmannis, LCSW     "

## 2024-08-12 NOTE — Progress Notes (Signed)
 Occupational Therapy Treatment Patient Details Name: Abigail [name] MRN: [id number] DOB: Jun 12, 1936 Today's Date: 08/12/2024   History of present illness Pt is an 88 yr old female admitted on 08/03/24 with generalized weakness. She fell at home while trying to put her pants on and was unable to get up. PMH includes: metastatic non-small lung cancer to brain, currently on radiation treatment, chronic hypoxic respiratory failure on 3 L O2, CKD stage IIIb, 2 skin cancers on her legs s/p resection, hypertension, coronary artery disease, chronic normocytic anemia, hypothyroidism.   OT comments  The pt was seen for ADL instruction, functional strengthening and progression of ADL participation. She required min assist to transfer onto and off the bedside commode using a RW, moderate assist for toileting management and set-up assist to min assist for upper body grooming seated in the chair. She will continue to benefit from OT services to address her impaired ADL performance and to decrease the risk for further weakness and deconditioning. The pt's daughter was present during the session and both the pt and her daughter are in agreement that the pt would benefit from short term SNF rehab before the pt returns home alone. Continue OT plan of care. Patient will benefit from continued inpatient follow up therapy, <3 hours/day.       If plan is discharge home, recommend the following:  A little help with walking and/or transfers;Assistance with cooking/housework;Help with stairs or ramp for entrance;Assist for transportation;A lot of help with bathing/dressing/bathroom   Equipment Recommendations  BSC/3in1    Recommendations for Other Services      Precautions / Restrictions Precautions Precautions: Fall Restrictions Weight Bearing Restrictions Per Provider Order: No Other Position/Activity Restrictions: uses 3L O2 all the time at her baseline       Mobility Bed Mobility Overal bed mobility: Needs  Assistance Bed Mobility: Supine to Sit     Supine to sit: Min assist, Used rails, HOB elevated          Transfers Overall transfer level: Needs assistance Equipment used: Rolling walker (2 wheels) Transfers: Sit to/from Stand, Bed to chair/wheelchair/BSC Sit to Stand: Min assist     Step pivot transfers: Min assist           Balance     Sitting balance-Leahy Scale: Fair         Standing balance comment: Min assist with RW           ADL either performed or assessed with clinical judgement   ADL Overall ADL's : Needs assistance/impaired     Grooming: Set up;Minimal assistance;Sitting Grooming Details (indicate cue type and reason): She was assisted to the bedside chair, where she required set-up assist for face washing and mouth rinsing. She further required min assist to perform hair washing and drying using a waterless shampoo cap.         Upper Body Dressing : Minimal assistance;Sitting Upper Body Dressing Details (indicate cue type and reason): She required assist to doff a hospital gown then to donn another clean one in sitting at chair level.     Toilet Transfer: Minimal assistance;Rolling walker (2 wheels);Stand-pivot;BSC/3in1;Cueing for sequencing Toilet Transfer Details (indicate cue type and reason): The pt was assisted onto and off the bedside commode, requiring min assist & cues for walker placement, trunk extension in standing, and to reach for arm rest prior to sitting. Toileting- Clothing Manipulation and Hygiene: Moderate assistance;Sit to/from stand Toileting - Clothing Manipulation Details (indicate cue type and reason): The pt required assist  for clothing management, cues for upright standing given forward flexed posture and assist for posterior peri-hygiene after the pt had a bowel movement.                      Communication Communication Communication: No apparent difficulties   Cognition Arousal: Alert Behavior During Therapy:  WFL for tasks assessed/performed         Memory impairment (select all impairments): Short-term memory     OT - Cognition Comments: Oriented to person, place, time and situation. Followed 1 step commands without difficulty.                 Following commands: Intact Following commands impaired: Only follows one step commands consistently      Cueing   Cueing Techniques: Verbal cues             Pertinent Vitals/ Pain       Pain Assessment Pain Assessment: No/denies pain   Frequency  Min 2X/week        Progress Toward Goals  OT Goals(current goals can now be found in the care plan section)     Acute Rehab OT Goals Patient Stated Goal: to get better and to return home OT Goal Formulation: With patient/family Time For Goal Achievement: 08/24/24 Potential to Achieve Goals: Good  Plan         AM-PAC OT 6 Clicks Daily Activity     Outcome Measure   Help from another person eating meals?: None Help from another person taking care of personal grooming?: A Little Help from another person toileting, which includes using toliet, bedpan, or urinal?: A Lot Help from another person bathing (including washing, rinsing, drying)?: A Lot Help from another person to put on and taking off regular upper body clothing?: A Little Help from another person to put on and taking off regular lower body clothing?: A Lot 6 Click Score: 16    End of Session Equipment Utilized During Treatment: Gait belt;Rolling walker (2 wheels);Oxygen   OT Visit Diagnosis: Unsteadiness on feet (R26.81);Muscle weakness (generalized) (M62.81);Other abnormalities of gait and mobility (R26.89)   Activity Tolerance Patient tolerated treatment well   Patient Left in chair;with call bell/phone within reach;with chair alarm set;with family/visitor present   Nurse Communication Mobility status        Time: 1030-1106 OT Time Calculation (min): 36 min  Charges: OT General Charges $OT Visit: 1  Visit OT Treatments $Self Care/Home Management : 8-22 mins $Therapeutic Activity: 8-22 mins    Delanna JINNY Lesches, OTR/L 08/12/2024, 12:41 PM

## 2024-08-12 NOTE — Telephone Encounter (Signed)
 Pharmacy Patient Advocate Encounter  Insurance verification completed.    The patient is insured through [employer]/ [employer]. Patient has Medicare and is not eligible for a copay card, but may be able to apply for patient assistance or Medicare RX Payment Plan (Patient Must reach out to their plan, if eligible for payment plan), if available.    Ran test claim for Eliquis  5mg  tablet and the current 30 day co-pay is $0.   This test claim was processed through [employer]- copay amounts may vary at other pharmacies due to [employer], or as the patient moves through the different stages of their insurance plan.

## 2024-08-13 DIAGNOSIS — N3000 Acute cystitis without hematuria: Secondary | ICD-10-CM | POA: Diagnosis not present

## 2024-08-13 DIAGNOSIS — N1832 Chronic kidney disease, stage 3b: Secondary | ICD-10-CM | POA: Diagnosis not present

## 2024-08-13 DIAGNOSIS — K5720 Diverticulitis of large intestine with perforation and abscess without bleeding: Secondary | ICD-10-CM | POA: Diagnosis not present

## 2024-08-13 DIAGNOSIS — N179 Acute kidney failure, unspecified: Secondary | ICD-10-CM | POA: Diagnosis not present

## 2024-08-13 LAB — GLUCOSE, CAPILLARY
Glucose-Capillary: 234 mg/dL — ABNORMAL HIGH (ref 70–99)
Glucose-Capillary: 218 mg/dL — ABNORMAL HIGH (ref 70–99)

## 2024-08-13 NOTE — Progress Notes (Signed)
 " Progress Note    Abigail [name]   FMW:[id number]  DOB: November 18, 1935  DOA: 08/03/2024     10 PCP: [name], FNP  Initial CC: Weakness  Hospital Course: Abigail [name] is an 88 year old female with PMH metastatic non-small lung cancer with mets to brain, currently on radiation treatment, chronic hypoxic respiratory failure on 3 L O2, CKD stage IIIb, 2 skin cancers on her legs s/p resection, HTN, CAD, COPD, chronic normocytic anemia and hypothyroidism.   Patient was admitted with generalized weakness and severe anemia, hemoglobin of 6.7 g/dL.  Patient has been transfused 2 units of packed red blood cells.  Weakness is thought to be multifactorial.   Workup revealed Pseudomonas UTI and sigmoid diverticulitis with presumed abscess concerning for focal/contained perforation.  General surgery also consulted on admission. Imaging studies also showed severe left hydronephrosis and hydroureter with ureter dilated to the level of the pelvis/area of inflammatory change at the sigmoid colon.  Creatinine also elevated at 2.2 on admission due to concern for bladder outlet obstruction. Lower extremity duplex also showed age-indeterminate DVT involving RLE and acute DVT involving LLE.   Assessment & Plan:   AKI on CKD stage IIIb BOO/Left hydronephrosis - Baseline creatinine around 1.6 - Admitted with serum creatinine of 2.2 -Poor oral intake prior to presentation - Renal ultrasound shows severe left hydronephrosis; also noted on CT was hydroureter with ureter dilated to the level of the pelvis/area of inflammatory change at the sigmoid - Urology following, appreciate assistance.  Tentative plan for potential stent placement if creatinine declines or fails to improve - Creatinine improved on 12/17, therefore urology holding off on stent placement at this time as showing improvement - Renal ultrasound repeated on 08/11/2024.  Moderate to severe but stable left hydronephrosis; renal function continues to  improve as well - Discussed with urology.  TOV started 12/19; so far voiding well without foley - trend BMP post foley removal  -Eventual outpatient follow-up with urology for repeat imaging again   Diverticulitis with abscess - CT on 08/04/2024 shows diffuse diverticular disease of the colon with presumed sigmoid diverticulitis and gas fluid collection involving left pelvic sidewall measuring 5.9 cm concerning for focal contained perforation or abscess - General Surgery following, no recommendations for intervention at this time.  Fluid collection is near iliac bone and not amenable for drainage - 2 weeks antibiotics recommended per surgery - transition abx to Augmentin  to complete course  - Diet has been advanced  Bilateral LE DVT - Significant edema noted in LLE and duplex shows acute DVT involving left common femoral vein, SF junction, femoral vein, proximal profunda vein, popliteal vein, posterior tibial vein, peroneal vein, external iliac vein - Age-indeterminate RLE DVT involving right popliteal, posterior tibial, peroneal veins - resume Lovenox  on 12/17 as no plans for stent/procedures  - transition to Eliquis  on 12/19 for ease of administration; poor candidate for Lovenox  at home due to cognitive issues  LLE SVT - Duplex also noted superficial venous thrombosis involving left great saphenous vein and acute intramuscular thrombosis involving left gastrocnemius veins and left soleal veins  Metastatic non-small cell lung cancer with solitary cerebral brain metastasis -Follows with Dr. Sherrod and rad onc.  Completed concurrent chemoradiation and 1 year of Durvalumab  consolidation therapy.   - Currently on stereotactic radiosurgery for brain mets.  She completed the course of Decadron .   - Plan of care also following, appreciate assistance.  Patient has difficulty partaking with GOC discussions and adequate comprehension/concentration centered around discussions -Remaining  full code and  full scope for now but needs outpatient palliative care to continue following at discharge   Chronic hypoxic respiratory failure/COPD -Currently not on exacerbation.  On 3 L of oxygen  chronically at home.  Continue bronchodilators, inhalers  Acute on chronic normocytic anemia -Baseline hemoglobin around 7 - 8 g/dL - Admitted with hemoglobin of 6.8 g/dL.  Likely associated with malignancy.  No evidence of acute blood loss.   - s/p 2 units PRBC since admission  -Continue ferrous sulfate .   Hypertension - Blood pressure is controlled -Takes metoprolol  at home.  Currently on hold   CAD Elevated troponin  -On aspirin , Lipitor , Repatha .  No anginal symptoms.  Takes aspirin  at home, currently on hold, will resume on discharge if no evidence of GI bleed. Denies any chest pain today.  Elevated troponin is likely from demand ischemia from anemia.   History of skin cancer -Underwent resection of the skin tumor on the left leg on October of this year.  Wound care consulted.  Wound appears to be slightly infected with purulent material.  Patient is on IV meropenem    History of recurrent UTI -On chronic therapy with nitrofurantoin . She has  noted frequency, dysuria symptoms.  Also complained of left lower quadrant pain.  Checking CT abdomen/pelvis without contrast.  Started on ceftriaxone .  Urine culture showing 30,000 colonies of Pseudomonas along with mixed organisms. - continue IV meropenem    Physical deconditioning -Patient lives with son but possibly in need of SNF - will ask for PT/OT to remain on team given no longer pursuing comfort care  Interval History:  No events overnight.  Has been voiding fairly well since Foley removal.  Doing well this morning in general with no concerns.   Antimicrobials: Rocephin  08/03/2024 >> 08/05/2024 Flagyl  08/04/2024 >> 08/08/2024 Meropenem  08/06/2024 >> 08/10/2024 Augmentin  08/10/2024 >> current  DVT prophylaxis:  SCDs Start: 08/03/24  1141 apixaban  (ELIQUIS ) tablet 5 mg   Code Status:   Code Status: Full Code  Mobility Assessment (Last 72 Hours)     Mobility Assessment     Row Name 08/13/24 0805 08/12/24 2130 08/12/24 1348 08/12/24 1236 08/12/24 0835   Does the patient have exclusion criteria? No- Perform mobility assessment No- Perform mobility assessment -- -- No- Perform mobility assessment   What is the highest level of mobility based on the mobility assessment? Level 3 (Stands with assistance) - Balance while standing  and cannot march in place Level 3 (Stands with assistance) - Balance while standing  and cannot march in place Level 4 (Ambulates with assistance) - Balance while stepping forward/back - Complete Level 4 (Ambulates with assistance) - Balance while stepping forward/back - Complete Level 2 (Chairfast) - Balance while sitting on edge of bed and cannot stand   Is the above level different from baseline mobility prior to current illness? Yes - Recommend PT order -- -- -- Yes - Recommend PT order    Row Name 08/11/24 2200 08/11/24 0850 08/10/24 2100 08/10/24 1746     Does the patient have exclusion criteria? No- Perform mobility assessment No- Perform mobility assessment No- Perform mobility assessment --    What is the highest level of mobility based on the mobility assessment? Level 2 (Chairfast) - Balance while sitting on edge of bed and cannot stand Level 2 (Chairfast) - Balance while sitting on edge of bed and cannot stand Level 2 (Chairfast) - Balance while sitting on edge of bed and cannot stand --  pt was seen at bed level  Is the above level different from baseline mobility prior to current illness? Yes - Recommend PT order Yes - Recommend PT order Yes - Recommend PT order --       Diet: Diet Orders (From admission, onward)     Start     Ordered   08/12/24 0732  Diet regular Room service appropriate? Yes; Fluid consistency: Thin  Diet effective now       Question Answer Comment  Room service  appropriate? Yes   Fluid consistency: Thin      08/12/24 0731            Barriers to discharge: Patient changing mind regarding GOC Disposition Plan: TBD; potentially rehab HH orders placed: TBD Status is: Inpatient  Objective: Blood pressure (!) 147/58, pulse 81, temperature 97.7 F (36.5 C), temperature source Oral, resp. rate 16, height 5' 6 (1.676 m), weight 71.5 kg, SpO2 96%.  Examination:  Physical Exam Constitutional:      Appearance: Normal appearance.  HENT:     Head: Normocephalic and atraumatic.     Mouth/Throat:     Mouth: Mucous membranes are moist.  Eyes:     Extraocular Movements: Extraocular movements intact.  Cardiovascular:     Rate and Rhythm: Normal rate and regular rhythm.  Pulmonary:     Effort: Pulmonary effort is normal. No respiratory distress.     Breath sounds: Normal breath sounds. No wheezing.  Abdominal:     General: Bowel sounds are normal. There is no distension.     Palpations: Abdomen is soft.     Tenderness: There is no abdominal tenderness.  Musculoskeletal:        General: Normal range of motion.     Cervical back: Normal range of motion and neck supple.     Left lower leg: Edema (4+ pitting edema throughout LLE) present.  Skin:    General: Skin is warm and dry.  Neurological:     General: No focal deficit present.     Mental Status: She is alert.  Psychiatric:        Attention and Perception: She is inattentive.        Speech: Speech is tangential.        Cognition and Memory: Cognition is impaired. Memory is impaired. She exhibits impaired recent memory.      Consultants:  Palliative care General Surgery-signed off Urology  Procedures:    Data Reviewed: Results for orders placed or performed during the hospital encounter of 08/03/24 (from the past 24 hours)  Glucose, capillary     Status: Abnormal   Collection Time: 08/12/24  4:21 PM  Result Value Ref Range   Glucose-Capillary 234 (H) 70 - 99 mg/dL    I have  reviewed pertinent nursing notes, vitals, labs, and images as necessary. I have ordered labwork to follow up on as indicated.  I have reviewed the last notes from staff over past 24 hours. I have discussed patient's care plan and test results with nursing staff, CM/SW, and other staff as appropriate.  Old records reviewed in assessment of this patient  Time spent: Greater than 50% of the 55 minute visit was spent in counseling/coordination of care for the patient as laid out in the A&P.   LOS: 10 days   Alm Apo, MD [facility] 08/13/2024, 1:43 PM "

## 2024-08-14 DIAGNOSIS — I824Y2 Acute embolism and thrombosis of unspecified deep veins of left proximal lower extremity: Secondary | ICD-10-CM | POA: Diagnosis not present

## 2024-08-14 DIAGNOSIS — N1832 Chronic kidney disease, stage 3b: Secondary | ICD-10-CM | POA: Diagnosis not present

## 2024-08-14 DIAGNOSIS — N3000 Acute cystitis without hematuria: Secondary | ICD-10-CM | POA: Diagnosis not present

## 2024-08-14 DIAGNOSIS — N1330 Unspecified hydronephrosis: Secondary | ICD-10-CM | POA: Diagnosis not present

## 2024-08-14 DIAGNOSIS — N179 Acute kidney failure, unspecified: Secondary | ICD-10-CM | POA: Diagnosis not present

## 2024-08-14 DIAGNOSIS — K5720 Diverticulitis of large intestine with perforation and abscess without bleeding: Secondary | ICD-10-CM | POA: Diagnosis not present

## 2024-08-14 LAB — CBC WITH DIFFERENTIAL/PLATELET
Abs Immature Granulocytes: 0.62 K/uL — ABNORMAL HIGH (ref 0.00–0.07)
Basophils Absolute: 0 K/uL (ref 0.0–0.1)
Basophils Relative: 1 %
Eosinophils Absolute: 0.1 K/uL (ref 0.0–0.5)
Eosinophils Relative: 1 %
HCT: 24.6 % — ABNORMAL LOW (ref 36.0–46.0)
Hemoglobin: 7.5 g/dL — ABNORMAL LOW (ref 12.0–15.0)
Immature Granulocytes: 7 %
Lymphocytes Relative: 4 %
Lymphs Abs: 0.4 K/uL — ABNORMAL LOW (ref 0.7–4.0)
MCH: 27.8 pg (ref 26.0–34.0)
MCHC: 30.5 g/dL (ref 30.0–36.0)
MCV: 91.1 fL (ref 80.0–100.0)
Monocytes Absolute: 0.8 K/uL (ref 0.1–1.0)
Monocytes Relative: 10 %
Neutro Abs: 6.6 K/uL (ref 1.7–7.7)
Neutrophils Relative %: 77 %
Platelets: 273 K/uL (ref 150–400)
RBC: 2.7 MIL/uL — ABNORMAL LOW (ref 3.87–5.11)
RDW: 17.4 % — ABNORMAL HIGH (ref 11.5–15.5)
WBC: 8.5 K/uL (ref 4.0–10.5)
nRBC: 0 % (ref 0.0–0.2)

## 2024-08-14 LAB — GLUCOSE, CAPILLARY
Glucose-Capillary: 133 mg/dL — ABNORMAL HIGH (ref 70–99)
Glucose-Capillary: 165 mg/dL — ABNORMAL HIGH (ref 70–99)
Glucose-Capillary: 182 mg/dL — ABNORMAL HIGH (ref 70–99)
Glucose-Capillary: 236 mg/dL — ABNORMAL HIGH (ref 70–99)

## 2024-08-14 LAB — BASIC METABOLIC PANEL WITH GFR
Anion gap: 6 (ref 5–15)
BUN: 33 mg/dL — ABNORMAL HIGH (ref 8–23)
CO2: 22 mmol/L (ref 22–32)
Calcium: 8.1 mg/dL — ABNORMAL LOW (ref 8.9–10.3)
Chloride: 109 mmol/L (ref 98–111)
Creatinine, Ser: 1.2 mg/dL — ABNORMAL HIGH (ref 0.44–1.00)
GFR, Estimated: 44 mL/min — ABNORMAL LOW
Glucose, Bld: 133 mg/dL — ABNORMAL HIGH (ref 70–99)
Potassium: 5.1 mmol/L (ref 3.5–5.1)
Sodium: 137 mmol/L (ref 135–145)

## 2024-08-14 NOTE — Plan of Care (Signed)
   Problem: Education: Goal: Knowledge of General Education information will improve Description: Including pain rating scale, medication(s)/side effects and non-pharmacologic comfort measures Outcome: Progressing   Problem: Health Behavior/Discharge Planning: Goal: Ability to manage health-related needs will improve Outcome: Progressing   Problem: Clinical Measurements: Goal: Will remain free from infection Outcome: Progressing

## 2024-08-14 NOTE — Progress Notes (Signed)
 " Progress Note    Abigail [name]   FMW:[id number]  DOB: 07-Nov-1935  DOA: 08/03/2024     11 PCP: [name], FNP  Initial CC: Weakness  Hospital Course: [name] is an 88 year old female with PMH metastatic non-small lung cancer with mets to brain, currently on radiation treatment, chronic hypoxic respiratory failure on 3 L O2, CKD stage IIIb, 2 skin cancers on her legs s/p resection, HTN, CAD, COPD, chronic normocytic anemia and hypothyroidism.   Patient was admitted with generalized weakness and severe anemia, hemoglobin of 6.7 g/dL.  Patient has been transfused 2 units of packed red blood cells.  Weakness is thought to be multifactorial.   Workup revealed Pseudomonas UTI and sigmoid diverticulitis with presumed abscess concerning for focal/contained perforation.  General surgery also consulted on admission. Imaging studies also showed severe left hydronephrosis and hydroureter with ureter dilated to the level of the pelvis/area of inflammatory change at the sigmoid colon.  Creatinine also elevated at 2.2 on admission due to concern for bladder outlet obstruction. Lower extremity duplex also showed age-indeterminate DVT involving RLE and acute DVT involving LLE.   Assessment & Plan:   AKI on CKD stage IIIb BOO/Left hydronephrosis - Baseline creatinine around 1.6 - Admitted with serum creatinine of 2.2 -Poor oral intake prior to presentation - Renal ultrasound shows severe left hydronephrosis; also noted on CT was hydroureter with ureter dilated to the level of the pelvis/area of inflammatory change at the sigmoid - Urology following, appreciate assistance.  Tentative plan for potential stent placement if creatinine declines or fails to improve - Creatinine improved on 12/17, therefore urology holding off on stent placement at this time as showing improvement - Renal ultrasound repeated on 08/11/2024.  Moderate to severe but stable left hydronephrosis; renal function continues to  improve as well - Discussed with urology.  TOV started 12/19; so far voiding well without foley - trend BMP post foley removal  -Eventual outpatient follow-up with urology for repeat imaging again   Diverticulitis with abscess - CT on 08/04/2024 shows diffuse diverticular disease of the colon with presumed sigmoid diverticulitis and gas fluid collection involving left pelvic sidewall measuring 5.9 cm concerning for focal contained perforation or abscess - General Surgery following, no recommendations for intervention at this time.  Fluid collection is near iliac bone and not amenable for drainage - 2 weeks antibiotics recommended per surgery - transition abx to Augmentin  to complete course  - Diet has been advanced  Bilateral LE DVT - Significant edema noted in LLE and duplex shows acute DVT involving left common femoral vein, SF junction, femoral vein, proximal profunda vein, popliteal vein, posterior tibial vein, peroneal vein, external iliac vein - Age-indeterminate RLE DVT involving right popliteal, posterior tibial, peroneal veins - resume Lovenox  on 12/17 as no plans for stent/procedures  - transition to Eliquis  on 12/19 for ease of administration; poor candidate for Lovenox  at home due to cognitive issues  LLE SVT - Duplex also noted superficial venous thrombosis involving left great saphenous vein and acute intramuscular thrombosis involving left gastrocnemius veins and left soleal veins  Metastatic non-small cell lung cancer with solitary cerebral brain metastasis -Follows with Dr. Sherrod and rad onc.  Completed concurrent chemoradiation and 1 year of Durvalumab  consolidation therapy.   - Currently on stereotactic radiosurgery for brain mets.  She completed the course of Decadron .   - Plan of care also following, appreciate assistance.  Patient has difficulty partaking with GOC discussions and adequate comprehension/concentration centered around discussions -Remaining  full code and  full scope for now but needs outpatient palliative care to continue following at discharge   Chronic hypoxic respiratory failure/COPD -Currently not on exacerbation.  On 3 L of oxygen  chronically at home.  Continue bronchodilators, inhalers  Acute on chronic normocytic anemia -Baseline hemoglobin around 7 - 8 g/dL - Admitted with hemoglobin of 6.8 g/dL.  Likely associated with malignancy.  No evidence of acute blood loss.   - s/p 2 units PRBC since admission  -Continue ferrous sulfate .   Hypertension - Blood pressure is controlled -Takes metoprolol  at home.  Currently on hold   CAD Elevated troponin  -On aspirin , Lipitor , Repatha .  No anginal symptoms.  Takes aspirin  at home, currently on hold, will resume on discharge if no evidence of GI bleed. Denies any chest pain today.  Elevated troponin is likely from demand ischemia from anemia.   History of skin cancer -Underwent resection of the skin tumor on the left leg on October of this year.  Wound care consulted.  Wound appears to be slightly infected with purulent material.  Patient is on IV meropenem    History of recurrent UTI -On chronic therapy with nitrofurantoin . She has  noted frequency, dysuria symptoms.  Also complained of left lower quadrant pain.  Checking CT abdomen/pelvis without contrast.  Started on ceftriaxone .  Urine culture showing 30,000 colonies of Pseudomonas along with mixed organisms. - continue IV meropenem    Physical deconditioning -Patient lives with son but possibly in need of SNF - will ask for PT/OT to remain on team given no longer pursuing comfort care  Interval History:  No events overnight.  Has been voiding fairly well since Foley removal.  Doing well this morning in general with no concerns. Awaiting placement.   Antimicrobials: Rocephin  08/03/2024 >> 08/05/2024 Flagyl  08/04/2024 >> 08/08/2024 Meropenem  08/06/2024 >> 08/10/2024 Augmentin  08/10/2024 >> current  DVT prophylaxis:  SCDs Start:  08/03/24 1141 apixaban  (ELIQUIS ) tablet 5 mg   Code Status:   Code Status: Full Code  Mobility Assessment (Last 72 Hours)     Mobility Assessment     Row Name 08/14/24 0934 08/13/24 2115 08/13/24 0805 08/12/24 2130 08/12/24 1348   Does the patient have exclusion criteria? No- Perform mobility assessment No- Perform mobility assessment No- Perform mobility assessment No- Perform mobility assessment --   What is the highest level of mobility based on the mobility assessment? Level 3 (Stands with assistance) - Balance while standing  and cannot march in place Level 3 (Stands with assistance) - Balance while standing  and cannot march in place Level 3 (Stands with assistance) - Balance while standing  and cannot march in place Level 3 (Stands with assistance) - Balance while standing  and cannot march in place Level 4 (Ambulates with assistance) - Balance while stepping forward/back - Complete   Is the above level different from baseline mobility prior to current illness? Yes - Recommend PT order Yes - Recommend PT order Yes - Recommend PT order -- --    Row Name 08/12/24 1236 08/12/24 0835 08/11/24 2200       Does the patient have exclusion criteria? -- No- Perform mobility assessment No- Perform mobility assessment     What is the highest level of mobility based on the mobility assessment? Level 4 (Ambulates with assistance) - Balance while stepping forward/back - Complete Level 2 (Chairfast) - Balance while sitting on edge of bed and cannot stand Level 2 (Chairfast) - Balance while sitting on edge of bed and cannot stand  Is the above level different from baseline mobility prior to current illness? -- Yes - Recommend PT order Yes - Recommend PT order        Diet: Diet Orders (From admission, onward)     Start     Ordered   08/12/24 0732  Diet regular Room service appropriate? Yes; Fluid consistency: Thin  Diet effective now       Question Answer Comment  Room service appropriate? Yes    Fluid consistency: Thin      08/12/24 0731            Barriers to discharge: Patient changing mind regarding GOC Disposition Plan: TBD; potentially rehab HH orders placed: TBD Status is: Inpatient  Objective: Blood pressure (!) 119/44, pulse 71, temperature 98.6 F (37 C), temperature source Oral, resp. rate 19, height 5' 6 (1.676 m), weight 71.5 kg, SpO2 94%.  Examination:  Physical Exam Constitutional:      Appearance: Normal appearance.  HENT:     Head: Normocephalic and atraumatic.     Mouth/Throat:     Mouth: Mucous membranes are moist.  Eyes:     Extraocular Movements: Extraocular movements intact.  Cardiovascular:     Rate and Rhythm: Normal rate and regular rhythm.  Pulmonary:     Effort: Pulmonary effort is normal. No respiratory distress.     Breath sounds: Normal breath sounds. No wheezing.  Abdominal:     General: Bowel sounds are normal. There is no distension.     Palpations: Abdomen is soft.     Tenderness: There is no abdominal tenderness.  Musculoskeletal:        General: Normal range of motion.     Cervical back: Normal range of motion and neck supple.     Left lower leg: Edema (4+ pitting edema throughout LLE) present.  Skin:    General: Skin is warm and dry.  Neurological:     General: No focal deficit present.     Mental Status: She is alert.  Psychiatric:        Attention and Perception: She is inattentive.        Speech: Speech is tangential.        Cognition and Memory: Cognition is impaired. Memory is impaired. She exhibits impaired recent memory.      Consultants:  Palliative care General Surgery-signed off Urology  Procedures:    Data Reviewed: Results for orders placed or performed during the hospital encounter of 08/03/24 (from the past 24 hours)  Glucose, capillary     Status: Abnormal   Collection Time: 08/13/24  9:19 PM  Result Value Ref Range   Glucose-Capillary 218 (H) 70 - 99 mg/dL  Basic metabolic panel with GFR      Status: Abnormal   Collection Time: 08/14/24  4:25 AM  Result Value Ref Range   Sodium 137 135 - 145 mmol/L   Potassium 5.1 3.5 - 5.1 mmol/L   Chloride 109 98 - 111 mmol/L   CO2 22 22 - 32 mmol/L   Glucose, Bld 133 (H) 70 - 99 mg/dL   BUN 33 (H) 8 - 23 mg/dL   Creatinine, Ser 8.79 (H) 0.44 - 1.00 mg/dL   Calcium  8.1 (L) 8.9 - 10.3 mg/dL   GFR, Estimated 44 (L) >60 mL/min   Anion gap 6 5 - 15  CBC with Differential/Platelet     Status: Abnormal   Collection Time: 08/14/24  4:25 AM  Result Value Ref Range   WBC 8.5 4.0 -  10.5 K/uL   RBC 2.70 (L) 3.87 - 5.11 MIL/uL   Hemoglobin 7.5 (L) 12.0 - 15.0 g/dL   HCT 75.3 (L) 63.9 - 53.9 %   MCV 91.1 80.0 - 100.0 fL   MCH 27.8 26.0 - 34.0 pg   MCHC 30.5 30.0 - 36.0 g/dL   RDW 82.5 (H) 88.4 - 84.4 %   Platelets 273 150 - 400 K/uL   nRBC 0.0 0.0 - 0.2 %   Neutrophils Relative % 77 %   Neutro Abs 6.6 1.7 - 7.7 K/uL   Lymphocytes Relative 4 %   Lymphs Abs 0.4 (L) 0.7 - 4.0 K/uL   Monocytes Relative 10 %   Monocytes Absolute 0.8 0.1 - 1.0 K/uL   Eosinophils Relative 1 %   Eosinophils Absolute 0.1 0.0 - 0.5 K/uL   Basophils Relative 1 %   Basophils Absolute 0.0 0.0 - 0.1 K/uL   Immature Granulocytes 7 %   Abs Immature Granulocytes 0.62 (H) 0.00 - 0.07 K/uL  Glucose, capillary     Status: Abnormal   Collection Time: 08/14/24  7:43 AM  Result Value Ref Range   Glucose-Capillary 133 (H) 70 - 99 mg/dL  Glucose, capillary     Status: Abnormal   Collection Time: 08/14/24 11:31 AM  Result Value Ref Range   Glucose-Capillary 165 (H) 70 - 99 mg/dL    I have reviewed pertinent nursing notes, vitals, labs, and images as necessary. I have ordered labwork to follow up on as indicated.  I have reviewed the last notes from staff over past 24 hours. I have discussed patient's care plan and test results with nursing staff, CM/SW, and other staff as appropriate.  Old records reviewed in assessment of this patient  Time spent: Greater than 50%  of the 55 minute visit was spent in counseling/coordination of care for the patient as laid out in the A&P.   LOS: 11 days   Alm Apo, MD [facility] 08/14/2024, 12:30 PM "

## 2024-08-15 DIAGNOSIS — N3000 Acute cystitis without hematuria: Secondary | ICD-10-CM | POA: Diagnosis not present

## 2024-08-15 DIAGNOSIS — N179 Acute kidney failure, unspecified: Secondary | ICD-10-CM | POA: Diagnosis not present

## 2024-08-15 DIAGNOSIS — K5720 Diverticulitis of large intestine with perforation and abscess without bleeding: Secondary | ICD-10-CM | POA: Diagnosis not present

## 2024-08-15 DIAGNOSIS — N1330 Unspecified hydronephrosis: Secondary | ICD-10-CM | POA: Diagnosis not present

## 2024-08-15 LAB — GLUCOSE, CAPILLARY
Glucose-Capillary: 142 mg/dL — ABNORMAL HIGH (ref 70–99)
Glucose-Capillary: 181 mg/dL — ABNORMAL HIGH (ref 70–99)
Glucose-Capillary: 174 mg/dL — ABNORMAL HIGH (ref 70–99)
Glucose-Capillary: 249 mg/dL — ABNORMAL HIGH (ref 70–99)

## 2024-08-15 NOTE — Progress Notes (Signed)
 " Progress Note    Abigail [name]   FMW:[id number]  DOB: 14-Jun-1936  DOA: 08/03/2024     12 PCP: Abigail Speaks, FNP  Initial CC: Weakness  Hospital Course: Abigail [name] is an 88 year old female with PMH metastatic non-small lung cancer with mets to brain, currently on radiation treatment, chronic hypoxic respiratory failure on 3 L O2, CKD stage IIIb, 2 skin cancers on her legs s/p resection, HTN, CAD, COPD, chronic normocytic anemia and hypothyroidism.   Patient was admitted with generalized weakness and severe anemia, hemoglobin of 6.7 g/dL.  Patient has been transfused 2 units of packed red blood cells.  Weakness is thought to be multifactorial.   Workup revealed Pseudomonas UTI and sigmoid diverticulitis with presumed abscess concerning for focal/contained perforation.  General surgery also consulted on admission. Imaging studies also showed severe left hydronephrosis and hydroureter with ureter dilated to the level of the pelvis/area of inflammatory change at the sigmoid colon.  Creatinine also elevated at 2.2 on admission due to concern for bladder outlet obstruction. Lower extremity duplex also showed age-indeterminate DVT involving RLE and acute DVT involving LLE.   Assessment & Plan:   AKI on CKD stage IIIb BOO/Left hydronephrosis - Baseline creatinine around 1.6 - Admitted with serum creatinine of 2.2 -Poor oral intake prior to presentation - Renal ultrasound shows severe left hydronephrosis; also noted on CT was hydroureter with ureter dilated to the level of the pelvis/area of inflammatory change at the sigmoid - Urology following, appreciate assistance.  Tentative plan for potential stent placement if creatinine declines or fails to improve - Creatinine improved on 12/17, therefore urology holding off on stent placement at this time as showing improvement - Renal ultrasound repeated on 08/11/2024.  Moderate to severe but stable left hydronephrosis; renal function continues to  improve as well - Discussed with urology.  TOV started 12/19; so far voiding well without foley - trend BMP post foley removal  -Eventual outpatient follow-up with urology for repeat imaging again   Diverticulitis with abscess - CT on 08/04/2024 shows diffuse diverticular disease of the colon with presumed sigmoid diverticulitis and gas fluid collection involving left pelvic sidewall measuring 5.9 cm concerning for focal contained perforation or abscess - General Surgery following, no recommendations for intervention at this time.  Fluid collection is near iliac bone and not amenable for drainage - 2 weeks antibiotics recommended per surgery - transition abx to Augmentin  to complete course  - Diet has been advanced  Bilateral LE DVT - Significant edema noted in LLE and duplex shows acute DVT involving left common femoral vein, SF junction, femoral vein, proximal profunda vein, popliteal vein, posterior tibial vein, peroneal vein, external iliac vein - Age-indeterminate RLE DVT involving right popliteal, posterior tibial, peroneal veins - resume Lovenox  on 12/17 as no plans for stent/procedures  - transition to Eliquis  on 12/19 for ease of administration; poor candidate for Lovenox  at home due to cognitive issues  LLE SVT - Duplex also noted superficial venous thrombosis involving left great saphenous vein and acute intramuscular thrombosis involving left gastrocnemius veins and left soleal veins  Metastatic non-small cell lung cancer with solitary cerebral brain metastasis -Follows with Abigail [name] and rad onc.  Completed concurrent chemoradiation and 1 year of Durvalumab  consolidation therapy.   - Currently on stereotactic radiosurgery for brain mets.  She completed the course of Decadron .   - Plan of care also following, appreciate assistance.  Patient has difficulty partaking with GOC discussions and adequate comprehension/concentration centered around discussions -Remaining  full code and  full scope for now but needs outpatient palliative care to continue following at discharge   Chronic hypoxic respiratory failure/COPD -Currently not on exacerbation.  On 3 L of oxygen  chronically at home.  Continue bronchodilators, inhalers  Acute on chronic normocytic anemia -Baseline hemoglobin around 7 - 8 g/dL - Admitted with hemoglobin of 6.8 g/dL.  Likely associated with malignancy.  No evidence of acute blood loss.   - s/p 2 units PRBC since admission  -Continue ferrous sulfate .   Hypertension - Blood pressure is controlled -Takes metoprolol  at home.  Currently on hold   CAD Elevated troponin  -On aspirin , Lipitor , Repatha .  No anginal symptoms.  Takes aspirin  at home, currently on hold, will resume on discharge if no evidence of GI bleed. Denies any chest pain today.  Elevated troponin is likely from demand ischemia from anemia.   History of skin cancer -Underwent resection of the skin tumor on the left leg on October of this year.  Wound care consulted.  Wound appears to be slightly infected with purulent material.  Patient is on IV meropenem    History of recurrent UTI -On chronic therapy with nitrofurantoin . She has  noted frequency, dysuria symptoms.  Also complained of left lower quadrant pain.  Checking CT abdomen/pelvis without contrast.  Started on ceftriaxone .  Urine culture showing 30,000 colonies of Pseudomonas along with mixed organisms. - continue IV meropenem    Physical deconditioning -Patient lives with son but possibly in need of SNF - will ask for PT/OT to remain on team given no longer pursuing comfort care  Interval History:  No events overnight.  Has been voiding fairly well since Foley removal.  Doing well this morning in general with no concerns. Daughter present bedside this morning also. Preference is [facility].  Awaiting insurance authorization.   Antimicrobials: Rocephin  08/03/2024 >> 08/05/2024 Flagyl  08/04/2024 >> 08/08/2024 Meropenem   08/06/2024 >> 08/10/2024 Augmentin  08/10/2024 >> current  DVT prophylaxis:  SCDs Start: 08/03/24 1141 apixaban  (ELIQUIS ) tablet 5 mg   Code Status:   Code Status: Full Code  Mobility Assessment (Last 72 Hours)     Mobility Assessment     Row Name 08/15/24 0840 08/14/24 2145 08/14/24 0934 08/13/24 2115 08/13/24 0805   Does the patient have exclusion criteria? No- Perform mobility assessment No- Perform mobility assessment No- Perform mobility assessment No- Perform mobility assessment No- Perform mobility assessment   What is the highest level of mobility based on the mobility assessment? Level 3 (Stands with assistance) - Balance while standing  and cannot march in place Level 3 (Stands with assistance) - Balance while standing  and cannot march in place Level 3 (Stands with assistance) - Balance while standing  and cannot march in place Level 3 (Stands with assistance) - Balance while standing  and cannot march in place Level 3 (Stands with assistance) - Balance while standing  and cannot march in place   Is the above level different from baseline mobility prior to current illness? Yes - Recommend PT order Yes - Recommend PT order Yes - Recommend PT order Yes - Recommend PT order Yes - Recommend PT order    Row Name 08/12/24 2130           Does the patient have exclusion criteria? No- Perform mobility assessment       What is the highest level of mobility based on the mobility assessment? Level 3 (Stands with assistance) - Balance while standing  and cannot march in place  Diet: Diet Orders (From admission, onward)     Start     Ordered   08/12/24 0732  Diet regular Room service appropriate? Yes; Fluid consistency: Thin  Diet effective now       Question Answer Comment  Room service appropriate? Yes   Fluid consistency: Thin      08/12/24 0731            Barriers to discharge: Patient changing mind regarding GOC Disposition Plan: [place] [facility]  HH orders placed:  TBD Status is: Inpatient  Objective: Blood pressure (!) 148/51, pulse 85, temperature 98.3 F (36.8 C), temperature source Oral, resp. rate 19, height 5' 6 (1.676 m), weight 71.5 kg, SpO2 96%.  Examination:  Physical Exam Constitutional:      Appearance: Normal appearance.  HENT:     Head: Normocephalic and atraumatic.     Mouth/Throat:     Mouth: Mucous membranes are moist.  Eyes:     Extraocular Movements: Extraocular movements intact.  Cardiovascular:     Rate and Rhythm: Normal rate and regular rhythm.  Pulmonary:     Effort: Pulmonary effort is normal. No respiratory distress.     Breath sounds: Normal breath sounds. No wheezing.  Abdominal:     General: Bowel sounds are normal. There is no distension.     Palpations: Abdomen is soft.     Tenderness: There is no abdominal tenderness.  Musculoskeletal:        General: Normal range of motion.     Cervical back: Normal range of motion and neck supple.     Left lower leg: Edema (4+ pitting edema throughout LLE) present.  Skin:    General: Skin is warm and dry.  Neurological:     General: No focal deficit present.     Mental Status: She is alert.  Psychiatric:        Attention and Perception: She is inattentive.        Speech: Speech is tangential.        Cognition and Memory: Cognition is impaired. Memory is impaired. She exhibits impaired recent memory.      Consultants:  Palliative care General Surgery-signed off Urology  Procedures:    Data Reviewed: Results for orders placed or performed during the hospital encounter of 08/03/24 (from the past 24 hours)  Glucose, capillary     Status: Abnormal   Collection Time: 08/14/24  4:35 PM  Result Value Ref Range   Glucose-Capillary 182 (H) 70 - 99 mg/dL  Glucose, capillary     Status: Abnormal   Collection Time: 08/14/24  9:39 PM  Result Value Ref Range   Glucose-Capillary 236 (H) 70 - 99 mg/dL  Glucose, capillary     Status: Abnormal   Collection Time:  08/15/24  7:43 AM  Result Value Ref Range   Glucose-Capillary 142 (H) 70 - 99 mg/dL  Glucose, capillary     Status: Abnormal   Collection Time: 08/15/24 11:27 AM  Result Value Ref Range   Glucose-Capillary 181 (H) 70 - 99 mg/dL    I have reviewed pertinent nursing notes, vitals, labs, and images as necessary. I have ordered labwork to follow up on as indicated.  I have reviewed the last notes from staff over past 24 hours. I have discussed patient's care plan and test results with nursing staff, CM/SW, and other staff as appropriate.  Old records reviewed in assessment of this patient  Time spent: Greater than 50% of the 55 minute visit was spent  in counseling/coordination of care for the patient as laid out in the A&P.   LOS: 12 days   Alm Apo, MD [facility] 08/15/2024, 2:12 PM "

## 2024-08-15 NOTE — TOC Progression Note (Addendum)
 Transition of Care [facility]) - Progression Note    Patient Details  Name: Abigail [name] MRN: [id number] Date of Birth: [birth year]  [facility]) CM/SW Contact  Jon ONEIDA Anon, RN Phone Number: 08/15/2024, 10:36 AM  Clinical Narrative:    RNCM met with pt at bedside to discuss SNF placement and bed offers. Pt/ pt family would like to discuss bed offers. Will inform ICM team member of choice after discussion. ICM will continue to follow for DC planning needs.   Addendum: Pt would like to accept bed offer at [facility]. Spoke with Admissions Coordinator at [facility] and they have a bed. Spoke with HTA at [phone] to initiate insurance authorization for SNF and PTAR. Awaiting insurance auth approval.    The following bed offers were presented to the patient/ pt daughter at bedside:  [facility] Preferred SNF  -- [address] [place] [address] [phone] 5 stars  [facility] AND REH[facility], [facility] Preferred SNF  -- [address], [place] [address] [phone] 4 stars    Expected Discharge Plan: Home w Home Health Services Barriers to Discharge: Continued Medical Work up               Expected Discharge Plan and Services       Living arrangements for the past 2 months: Single Family Home                                       Social Drivers of Health (SDOH) Interventions SDOH Screenings   Food Insecurity: No Food Insecurity (08/03/2024)  Housing: Low Risk (08/03/2024)  Transportation Needs: No Transportation Needs (08/03/2024)  Utilities: Not At Risk (08/03/2024)  Alcohol Screen: Low Risk (03/09/2024)  Depression (PHQ2-9): Low Risk (07/01/2024)  Financial Resource Strain: Medium Risk (03/09/2024)  Physical Activity: Inactive (03/09/2024)  Social Connections: Moderately Isolated (08/03/2024)  Stress: No Stress Concern Present (03/09/2024)  Recent Concern: Stress - Stress  Concern Present (03/09/2024)  Tobacco Use: Medium Risk (08/05/2024)  Health Literacy: Adequate Health Literacy (03/09/2024)    Readmission Risk Interventions    06/07/2024    3:57 PM  Readmission Risk Prevention Plan  Transportation Screening Complete  PCP or Specialist Appt within 3-5 Days Complete  HRI or Home Care Consult Complete  Social Work Consult for Recovery Care Planning/Counseling Complete  Palliative Care Screening Complete  Medication Review Ocean[facility]) Referral to Pharmacy

## 2024-08-15 NOTE — Plan of Care (Signed)

## 2024-08-15 NOTE — Progress Notes (Signed)
 Physical Therapy Treatment Patient Details Name: Abigail [name] MRN: [id number] DOB: 1936-08-17 Today's Date: 08/15/2024   History of Present Illness Pt is an 88y.o female admitted on 08/03/24 with generalized weakness. She fell at home while trying to put her pants on and was unable to get up. PMH includes: metastatic non-small lung cancer to brain, currently on radiation treatment, chronic hypoxic respiratory failure on 3 L O2, CKD stage IIIb, 2 skin cancers on her legs s/p resection, hypertension, coronary artery disease, chronic normocytic anemia, hypothyroidism.    PT Comments  PT - Cognition Comments: AxO x 3 sweet Lady.  Max c/o weakness and fatigue after activity.  Remained on 3 lts.  Lives home with Gilmer Grice.  Assisted OOB was difficult.  General bed mobility comments: required Max Assist for upper body and Max Assist + 2 to scoot to EOB. General transfer comment: Pt required any where from Min to Max  Assist to rise from recliner pending fatigue level, surface height and time of day.  Max VC's on safety with turns as Pt tends to sit quickly due to c/o B LE weakness and averall fatigue. General Gait Details: Very limited amb tolerance/distance 11 feet x 2 due to increassed c/o B LE weakness and fatigue.  Poor forward flexed posture.  Unsteady with turns and back steps.  Remained on 3 lts.  Sats avg 96% and HR 92.  HIGH FALL RISK.  Prior Level of Function : Independent/Modified Independent Mobility Comments: walks with rollator, on 3L O2 24/7 but then reports taking it off to go to the bathroom as she has tripped over O2 line when in bathroom before, denies falls in past 6 months ADLs Comments: doesn't drive; independent with showering using shower seat   LPT has rec Pt will need ST Rehab at SNF to address mobility and functional decline prior to safely returning home.    If plan is discharge home, recommend the following: A little help with walking and/or transfers;A little help with  bathing/dressing/bathroom;Assist for transportation;Assistance with cooking/housework   Can travel by private vehicle     No  Equipment Recommendations       Recommendations for Other Services       Precautions / Restrictions Precautions Precautions: Fall Precaution/Restrictions Comments: O2 dep at baseline-3L Restrictions Weight Bearing Restrictions Per Provider Order: No     Mobility  Bed Mobility Overal bed mobility: Needs Assistance Bed Mobility: Supine to Sit     Supine to sit: Max assist, +2 for safety/equipment     General bed mobility comments: required Max Assist for upper body and Max Assist + 2 to scoot to EOB.    Transfers Overall transfer level: Needs assistance Equipment used: Rolling walker (2 wheels) Transfers: Sit to/from Stand Sit to Stand: Min assist, Mod assist           General transfer comment: Pt required any where from Min to Max  Assist to rise from recliner pending fatigue level, surface height and time of day.  Max VC's on safety with turns as Pt tends to sit quickly due to c/o B LE weakness and averall fatigue.    Ambulation/Gait Ambulation/Gait assistance: Mod assist Gait Distance (Feet): 22 Feet (11 feet x 2) Assistive device: Rolling walker (2 wheels) Gait Pattern/deviations: Step-through pattern, Decreased stride length Gait velocity: decreased     General Gait Details: Very limited amb tolerance/distance 11 feet x 2 due to increassed c/o B LE weakness and fatigue.  Poor forward flexed posture.  Unsteady with turns and back steps.  Remained on 3 lts.  HIGH FALL RISK.   Stairs             Wheelchair Mobility     Tilt Bed    Modified Rankin (Stroke Patients Only)       Balance                                            Communication Communication Communication: No apparent difficulties  Cognition Arousal: Alert Behavior During Therapy: WFL for tasks assessed/performed   PT - Cognitive  impairments: No apparent impairments                       PT - Cognition Comments: AxO x 3 sweet Lady.  Max c/o weakness and fatigue after activity.  Remained on 3 lts.  Lives home with Gilmer Grice. Following commands: Intact Following commands impaired: Only follows one step commands consistently    Cueing Cueing Techniques: Verbal cues  Exercises      General Comments        Pertinent Vitals/Pain Pain Assessment Pain Assessment: No/denies pain    Home Living                          Prior Function            PT Goals (current goals can now be found in the care plan section) Progress towards PT goals: Progressing toward goals    Frequency    Min 2X/week      PT Plan      Co-evaluation              AM-PAC PT 6 Clicks Mobility   Outcome Measure  Help needed turning from your back to your side while in a flat bed without using bedrails?: A Lot Help needed moving from lying on your back to sitting on the side of a flat bed without using bedrails?: A Lot Help needed moving to and from a bed to a chair (including a wheelchair)?: A Lot Help needed standing up from a chair using your arms (e.g., wheelchair or bedside chair)?: A Lot Help needed to walk in hospital room?: A Lot Help needed climbing 3-5 steps with a railing? : Total 6 Click Score: 11    End of Session Equipment Utilized During Treatment: Gait belt;Oxygen  Activity Tolerance: Patient limited by fatigue Patient left: with call bell/phone within reach;with family/visitor present;in bed;with bed alarm set Nurse Communication: Mobility status PT Visit Diagnosis: Unsteadiness on feet (R26.81);Muscle weakness (generalized) (M62.81);Difficulty in walking, not elsewhere classified (R26.2)     Time: 8952-8897 PT Time Calculation (min) (ACUTE ONLY): 15 min  Charges:    $Gait Training: 8-22 mins PT General Charges $$ ACUTE PT VISIT: 1 Visit                     Katheryn Leap   PTA Acute  Rehabilitation Services Office M-F          [phone]

## 2024-08-16 DIAGNOSIS — N179 Acute kidney failure, unspecified: Secondary | ICD-10-CM | POA: Diagnosis not present

## 2024-08-16 DIAGNOSIS — I824Y2 Acute embolism and thrombosis of unspecified deep veins of left proximal lower extremity: Secondary | ICD-10-CM | POA: Diagnosis not present

## 2024-08-16 DIAGNOSIS — N1832 Chronic kidney disease, stage 3b: Secondary | ICD-10-CM | POA: Diagnosis not present

## 2024-08-16 DIAGNOSIS — N3000 Acute cystitis without hematuria: Secondary | ICD-10-CM | POA: Diagnosis not present

## 2024-08-16 DIAGNOSIS — Z7189 Other specified counseling: Secondary | ICD-10-CM | POA: Diagnosis not present

## 2024-08-16 DIAGNOSIS — K5720 Diverticulitis of large intestine with perforation and abscess without bleeding: Secondary | ICD-10-CM | POA: Diagnosis not present

## 2024-08-16 DIAGNOSIS — N1330 Unspecified hydronephrosis: Secondary | ICD-10-CM | POA: Diagnosis not present

## 2024-08-16 LAB — GLUCOSE, CAPILLARY
Glucose-Capillary: 137 mg/dL — ABNORMAL HIGH (ref 70–99)
Glucose-Capillary: 147 mg/dL — ABNORMAL HIGH (ref 70–99)
Glucose-Capillary: 174 mg/dL — ABNORMAL HIGH (ref 70–99)
Glucose-Capillary: 188 mg/dL — ABNORMAL HIGH (ref 70–99)

## 2024-08-16 NOTE — Progress Notes (Signed)
 " Progress Note    Abigail [name]   FMW:[id number]  DOB: [birth year]  DOA: 08/03/2024     13 PCP: [name], FNP  Initial CC: Weakness  Hospital Course: Ms. Femia is an 88 year old female with PMH metastatic non-small lung cancer with mets to brain, currently on radiation treatment, chronic hypoxic respiratory failure on 3 L O2, CKD stage IIIb, 2 skin cancers on her legs s/p resection, HTN, CAD, COPD, chronic normocytic anemia and hypothyroidism.   Patient was admitted with generalized weakness and severe anemia, hemoglobin of 6.7 g/dL.  Patient has been transfused 2 units of packed red blood cells.  Weakness is thought to be multifactorial.   Workup revealed Pseudomonas UTI and sigmoid diverticulitis with presumed abscess concerning for focal/contained perforation.  General surgery also consulted on admission. Imaging studies also showed severe left hydronephrosis and hydroureter with ureter dilated to the level of the pelvis/area of inflammatory change at the sigmoid colon.  Creatinine also elevated at 2.2 on admission due to concern for bladder outlet obstruction. Lower extremity duplex also showed age-indeterminate DVT involving RLE and acute DVT involving LLE.   Assessment & Plan:   AKI on CKD stage IIIb BOO/Left hydronephrosis - Baseline creatinine around 1.6 - Admitted with serum creatinine of 2.2 -Poor oral intake prior to presentation - Renal ultrasound shows severe left hydronephrosis; also noted on CT was hydroureter with ureter dilated to the level of the pelvis/area of inflammatory change at the sigmoid - Urology following, appreciate assistance.  Tentative plan for potential stent placement if creatinine declines or fails to improve - Creatinine improved on 12/17, therefore urology holding off on stent placement at this time as showing improvement - Renal ultrasound repeated on 08/11/2024.  Moderate to severe but stable left hydronephrosis; renal function continues to  improve as well - Discussed with urology.  TOV started 12/19; so far voiding well without foley - trend BMP post foley removal  -Eventual outpatient follow-up with urology for repeat imaging again   Diverticulitis with abscess - CT on 08/04/2024 shows diffuse diverticular disease of the colon with presumed sigmoid diverticulitis and gas fluid collection involving left pelvic sidewall measuring 5.9 cm concerning for focal contained perforation or abscess - General Surgery following, no recommendations for intervention at this time.  Fluid collection is near iliac bone and not amenable for drainage - 2 weeks antibiotics recommended per surgery - transition abx to Augmentin  to complete course  - Diet has been advanced  Bilateral LE DVT - Significant edema noted in LLE and duplex shows acute DVT involving left common femoral vein, SF junction, femoral vein, proximal profunda vein, popliteal vein, posterior tibial vein, peroneal vein, external iliac vein - Age-indeterminate RLE DVT involving right popliteal, posterior tibial, peroneal veins - resume Lovenox  on 12/17 as no plans for stent/procedures  - transition to Eliquis  on 12/19 for ease of administration; poor candidate for Lovenox  at home due to cognitive issues  LLE SVT - Duplex also noted superficial venous thrombosis involving left great saphenous vein and acute intramuscular thrombosis involving left gastrocnemius veins and left soleal veins  Metastatic non-small cell lung cancer with solitary cerebral brain metastasis -Follows with Dr. Sherrod and rad onc.  Completed concurrent chemoradiation and 1 year of Durvalumab  consolidation therapy.   - Currently on stereotactic radiosurgery for brain mets.  She completed the course of Decadron .   - Plan of care also following, appreciate assistance.  Patient has difficulty partaking with GOC discussions and adequate comprehension/concentration centered around discussions -Remaining  full code and  full scope for now but needs outpatient palliative care and/or hospice to continue following at discharge   Chronic hypoxic respiratory failure/COPD -Currently not on exacerbation.  On 3 L of oxygen  chronically at home.  Continue bronchodilators, inhalers  Acute on chronic normocytic anemia -Baseline hemoglobin around 7 - 8 g/dL - Admitted with hemoglobin of 6.8 g/dL.  Likely associated with malignancy.  No evidence of acute blood loss.   - s/p 2 units PRBC since admission  -Continue ferrous sulfate .   Hypertension - Blood pressure is controlled -Takes metoprolol  at home.  Currently on hold   CAD Elevated troponin  -On aspirin , Lipitor , Repatha .  No anginal symptoms.  Takes aspirin  at home, currently on hold, will resume on discharge if no evidence of GI bleed. Denies any chest pain today.  Elevated troponin is likely from demand ischemia from anemia.   History of skin cancer -Underwent resection of the skin tumor on the left leg on October of this year.  Wound care consulted.  Wound appears to be slightly infected with purulent material.  Patient is on IV meropenem    History of recurrent UTI -On chronic therapy with nitrofurantoin . She has  noted frequency, dysuria symptoms.  Also complained of left lower quadrant pain.  Checking CT abdomen/pelvis without contrast.  Started on ceftriaxone .  Urine culture showing 30,000 colonies of Pseudomonas along with mixed organisms. - continue IV meropenem    Physical deconditioning -Patient lives with son but possibly in need of SNF; ultimately would benefit from LTC especially with memory problems lately  - will ask for PT/OT to remain on team given no longer pursuing comfort care - denied for SNF; denied by [name] as well; likely will need max HH arranged and still will need hospice to follow at home on discharge as well  Interval History:  No events overnight.  Daughter present bedside this morning. Unfortunately declined by insurance for  short-term rehab.  Also declined via peer to peer.  Discussion held bedside with daughter and patient regarding the reality of going home and we'll try to arrange [facility] as well as getting hospice established to help give her as much home support as possible.  I again told her she would ultimately benefit from long-term care but this is not a feasible financial option for them.   Antimicrobials: Rocephin  08/03/2024 >> 08/05/2024 Flagyl  08/04/2024 >> 08/08/2024 Meropenem  08/06/2024 >> 08/10/2024 Augmentin  08/10/2024 >> current  DVT prophylaxis:  SCDs Start: 08/03/24 1141 apixaban  (ELIQUIS ) tablet 5 mg   Code Status:   Code Status: Full Code  Mobility Assessment (Last 72 Hours)     Mobility Assessment     Row Name 08/15/24 2000 08/15/24 0840 08/14/24 2145 08/14/24 0934 08/13/24 2115   Does the patient have exclusion criteria? No- Perform mobility assessment No- Perform mobility assessment No- Perform mobility assessment No- Perform mobility assessment No- Perform mobility assessment   What is the highest level of mobility based on the mobility assessment? Level 4 (Ambulates with assistance) - Balance while stepping forward/back - Complete Level 3 (Stands with assistance) - Balance while standing  and cannot march in place Level 3 (Stands with assistance) - Balance while standing  and cannot march in place Level 3 (Stands with assistance) - Balance while standing  and cannot march in place Level 3 (Stands with assistance) - Balance while standing  and cannot march in place   Is the above level different from baseline mobility prior to current illness? Yes - Recommend PT  order Yes - Recommend PT order Yes - Recommend PT order Yes - Recommend PT order Yes - Recommend PT order      Diet: Diet Orders (From admission, onward)     Start     Ordered   08/12/24 0732  Diet regular Room service appropriate? Yes; Fluid consistency: Thin  Diet effective now       Question Answer Comment  Room service  appropriate? Yes   Fluid consistency: Thin      08/12/24 0731            Barriers to discharge: Patient changing mind regarding GOC Disposition Plan: Home with home health and hospice HH orders placed: TBD Status is: Inpatient  Objective: Blood pressure (!) 126/55, pulse 76, temperature 99 F (37.2 C), temperature source Oral, resp. rate 19, height 5' 6 (1.676 m), weight 71.5 kg, SpO2 95%.  Examination:  Physical Exam Constitutional:      Appearance: Normal appearance.  HENT:     Head: Normocephalic and atraumatic.     Mouth/Throat:     Mouth: Mucous membranes are moist.  Eyes:     Extraocular Movements: Extraocular movements intact.  Cardiovascular:     Rate and Rhythm: Normal rate and regular rhythm.  Pulmonary:     Effort: Pulmonary effort is normal. No respiratory distress.     Breath sounds: Normal breath sounds. No wheezing.  Abdominal:     General: Bowel sounds are normal. There is no distension.     Palpations: Abdomen is soft.     Tenderness: There is no abdominal tenderness.  Musculoskeletal:        General: Normal range of motion.     Cervical back: Normal range of motion and neck supple.     Left lower leg: Edema (4+ pitting edema throughout LLE) present.  Skin:    General: Skin is warm and dry.  Neurological:     General: No focal deficit present.     Mental Status: She is alert.  Psychiatric:        Attention and Perception: She is inattentive.        Speech: Speech is tangential.        Cognition and Memory: Cognition is impaired. Memory is impaired. She exhibits impaired recent memory.      Consultants:  Palliative care General Surgery-signed off Urology  Procedures:    Data Reviewed: Results for orders placed or performed during the hospital encounter of 08/03/24 (from the past 24 hours)  Glucose, capillary     Status: Abnormal   Collection Time: 08/15/24  4:25 PM  Result Value Ref Range   Glucose-Capillary 174 (H) 70 - 99 mg/dL   Glucose, capillary     Status: Abnormal   Collection Time: 08/15/24  9:13 PM  Result Value Ref Range   Glucose-Capillary 249 (H) 70 - 99 mg/dL  Glucose, capillary     Status: Abnormal   Collection Time: 08/16/24  7:42 AM  Result Value Ref Range   Glucose-Capillary 137 (H) 70 - 99 mg/dL    I have reviewed pertinent nursing notes, vitals, labs, and images as necessary. I have ordered labwork to follow up on as indicated.  I have reviewed the last notes from staff over past 24 hours. I have discussed patient's care plan and test results with nursing staff, CM/SW, and other staff as appropriate.  Old records reviewed in assessment of this patient  Time spent: Greater than 50% of the 55 minute visit was spent in  counseling/coordination of care for the patient as laid out in the A&P.   LOS: 13 days   Alm Apo, MD [facility] 08/16/2024, 2:39 PM "

## 2024-08-16 NOTE — Progress Notes (Signed)
 Occupational Therapy Treatment Patient Details Name: Abigail [name] MRN: [id number] DOB: 09/08/1935 Today's Date: 08/16/2024   History of present illness Pt is an 88y.o female admitted on 08/03/24 with generalized weakness. She fell at home while trying to put her pants on and was unable to get up. PMH includes: metastatic non-small lung cancer to brain, currently on radiation treatment, chronic hypoxic respiratory failure on 3 L O2, CKD stage IIIb, 2 skin cancers on her legs s/p resection, hypertension, coronary artery disease, chronic normocytic anemia, hypothyroidism.   OT comments  Patient seen for skilled OT session with daughter bedside. Patient had been up to recliner  +4 hrs this am an back to bed for rest but open to all therapy presented. Requested pain meds due to abdominal discomfort and nursing administered during session. Patient able to participate with RW use to access [facility] and perform bedside amb with assist. Seated level simple grooming EOB. See below for current status. Recommending continued inpatient follow up therapy, <3 hours/day. Patient requires continued Acute care hospital level OT services to progress safety and functional performance and allow for discharge.        If plan is discharge home, recommend the following:  Assistance with cooking/housework;Help with stairs or ramp for entrance;Assist for transportation;A lot of help with bathing/dressing/bathroom;A lot of help with walking and/or transfers   Equipment Recommendations  BSC/3in1;Wheelchair (measurements OT);Wheelchair cushion (measurements OT);Hospital bed (if home and snf still denial)       Precautions / Restrictions Precautions Precautions: Fall Recall of Precautions/Restrictions: Intact Precaution/Restrictions Comments: O2 dep at baseline-3L Restrictions Weight Bearing Restrictions Per Provider Order: No Other Position/Activity Restrictions: uses 3L O2 all the time at her baseline       Mobility  Bed Mobility Overal bed mobility: Needs Assistance Bed Mobility: Supine to Sit, Sit to Supine     Supine to sit: Min assist Sit to supine: Mod assist   General bed mobility comments: assist for L LE in and out of bed due to edema    Transfers Overall transfer level: Needs assistance Equipment used: Rolling walker (2 wheels) Transfers: Sit to/from Stand, Bed to chair/wheelchair/BSC Sit to Stand: Min assist, Mod assist     Step pivot transfers: Min assist     General transfer comment: min cues for hand placement and step sequencing with RW     Balance Overall balance assessment: Needs assistance Sitting-balance support: Feet supported, Bilateral upper extremity supported Sitting balance-Leahy Scale: Fair     Standing balance support: Bilateral upper extremity supported, During functional activity, Reliant on assistive device for balance Standing balance-Leahy Scale: Fair Standing balance comment: RW support                           ADL either performed or assessed with clinical judgement   ADL Overall ADL's : Needs assistance/impaired                         Toilet Transfer: Minimal assistance;Rolling walker (2 wheels);Stand-pivot;BSC/3in1;Cueing for sequencing   Toileting- Clothing Manipulation and Hygiene: Moderate assistance       Functional mobility during ADLs: Minimal assistance;Moderate assistance;Rolling walker (2 wheels) General ADL Comments: decreased functional reach to LE's    Extremity/Trunk Assessment Upper Extremity Assessment Upper Extremity Assessment: Generalized weakness;Right hand dominant   Lower Extremity Assessment Lower Extremity Assessment: Defer to PT evaluation  Communication Communication Communication: No apparent difficulties   Cognition Arousal: Alert Behavior During Therapy: WFL for tasks assessed/performed Cognition: Cognition impaired             OT - Cognition Comments:  Oriented to person, place, time and situation. Followed 1 step commands without difficulty mild higher level processing                 Following commands: Intact Following commands impaired: Only follows one step commands consistently      Cueing   Cueing Techniques: Verbal cues  Exercises Exercises: Other exercises (foam ball squeezes and IS q hr)       General Comments L LE 2-3+ pitting edema noted, on 2 ltrs O2 via [place] with SpO2 98%    Pertinent Vitals/ Pain       Pain Assessment Pain Assessment: 0-10 Pain Score: 7  Pain Location: abdomen Pain Descriptors / Indicators: Discomfort Pain Intervention(s): Monitored during session, Repositioned, Patient requesting pain meds-RN notified, RN gave pain meds during session   Frequency  Min 2X/week        Progress Toward Goals  OT Goals(current goals can now be found in the care plan section)  Progress towards OT goals: Progressing toward goals  ADL Goals Pt Will Perform Grooming: with supervision;standing   AM-PAC OT 6 Clicks Daily Activity     Outcome Measure   Help from another person eating meals?: None Help from another person taking care of personal grooming?: A Little Help from another person toileting, which includes using toliet, bedpan, or urinal?: A Lot Help from another person bathing (including washing, rinsing, drying)?: A Lot Help from another person to put on and taking off regular upper body clothing?: A Little Help from another person to put on and taking off regular lower body clothing?: A Lot 6 Click Score: 16    End of Session Equipment Utilized During Treatment: Gait belt;Rolling walker (2 wheels);Oxygen   OT Visit Diagnosis: Unsteadiness on feet (R26.81);Muscle weakness (generalized) (M62.81);Other abnormalities of gait and mobility (R26.89)   Activity Tolerance Patient tolerated treatment well   Patient Left with call bell/phone within reach;with family/visitor present;in bed;with bed  alarm set;with nursing/sitter in room   Nurse Communication Mobility status        Time: 8449-8385 OT Time Calculation (min): 24 min  Charges: OT General Charges $OT Visit: 1 Visit OT Treatments $Self Care/Home Management : 8-22 mins [name] OT/L Acute Rehabilitation Department  [phone]  08/16/2024, 4:22 PM

## 2024-08-16 NOTE — TOC CM/SW Note (Signed)
 RNCM received phone call from [employer] stating a peer to peer would need to be completed today by 2 pm by calling Dr Janit at [phone]. [place], MD notified. Ambulance auth approval # is [id number].

## 2024-08-16 NOTE — Plan of Care (Signed)

## 2024-08-16 NOTE — TOC Progression Note (Signed)
 Transition of Care [facility]) - Progression Note    Patient Details  Name: Abigail [name] MRN: [id number] Date of Birth: 01-Feb-1936  Transition of Care [facility]) CM/SW Contact  Tawni CHRISTELLA Eva, LCSW Phone Number: 08/16/2024, 3:53 PM  Clinical Narrative:     CSW met with pt and pt's daughter at bedside to discuss SNF denial. CSW provided pt with [facility] denial paperwork. Pt's daughter reports that she will start the SNF  appeal process. ICM to follow.                   Expected Discharge Plan and Services       Living arrangements for the past 2 months: Single Family Home                                       Social Drivers of Health (SDOH) Interventions SDOH Screenings   Food Insecurity: No Food Insecurity (08/03/2024)  Housing: Low Risk (08/03/2024)  Transportation Needs: No Transportation Needs (08/03/2024)  Utilities: Not At Risk (08/03/2024)  Alcohol Screen: Low Risk (03/09/2024)  Depression (PHQ2-9): Low Risk (07/01/2024)  Financial Resource Strain: Medium Risk (03/09/2024)  Physical Activity: Inactive (03/09/2024)  Social Connections: Moderately Isolated (08/03/2024)  Stress: No Stress Concern Present (03/09/2024)  Recent Concern: Stress - Stress Concern Present (03/09/2024)  Tobacco Use: Medium Risk (08/05/2024)  Health Literacy: Adequate Health Literacy (03/09/2024)    Readmission Risk Interventions    06/07/2024    3:57 PM  Readmission Risk Prevention Plan  Transportation Screening Complete  PCP or Specialist Appt within 3-5 Days Complete  HRI or Home Care Consult Complete  Social Work Consult for Recovery Care Planning/Counseling Complete  Palliative Care Screening Complete  Medication Review Oceanographer) Referral to Pharmacy

## 2024-08-16 NOTE — Progress Notes (Addendum)
" °   08/15/24 1740  Spiritual Encounters  Type of Visit Initial  Care provided to: Pt and family  Referral source Nurse (RN/NT/LPN)  Reason for visit Advance directives  OnCall Visit No  Interventions  Spiritual Care Interventions Made Established relationship of care and support;Normalization of emotions;Decision-making support/facilitation   I responded to a page by Tinnie, RN on family's behalf (also consult request by Recardo, RN) for Advanced Directives assistance. Upon discussion with Ronal Like and her daughter, Burnard, and son, [name], they had hoped chaplain could assist with a general power of attorney which is not a service [facility] provides.  I offered brief education on the difference of a health care power of attorney that we may be able to help with. I also described other options such as code status and MOST forms. I facilitated discussion among the family and also spoke with Ronal one on one to understand her needs. Mellody expressed distress about code status and described confusion around past choice of DNR. This chaplain let her know that this can be changed by discussing with her provider. Family had a generative discussion where Narda expressed her wishes for her care. Chaplain also invited some grief processing as Franziska reflected on how changes to her health brought memories of her father's treatment and end of life experience. I offered prayer outside room with [name] with his consent.  Fendi Meinhardt L. Delores HERO.Div "

## 2024-08-16 NOTE — Progress Notes (Signed)
 OT Cancellation Note  Patient Details Name: Abigail [name] MRN: [id number] DOB: Jan 26, 1936   Cancelled Treatment:    Reason Eval/Treat Not Completed: Other (comment)  OT attempted session and patient and visitor on speaker phone having meeting/discussion. Will continue to follow for next visit as schedule allows.   Carolyne Whitsel OT/L Acute Rehabilitation Department  [phone]    08/16/2024, 12:55 PM

## 2024-08-17 ENCOUNTER — Encounter: Payer: Self-pay | Admitting: Nurse Practitioner

## 2024-08-17 ENCOUNTER — Encounter: Payer: Self-pay | Admitting: Internal Medicine

## 2024-08-17 DIAGNOSIS — K5720 Diverticulitis of large intestine with perforation and abscess without bleeding: Secondary | ICD-10-CM | POA: Diagnosis not present

## 2024-08-17 DIAGNOSIS — N1330 Unspecified hydronephrosis: Secondary | ICD-10-CM | POA: Diagnosis not present

## 2024-08-17 DIAGNOSIS — N179 Acute kidney failure, unspecified: Secondary | ICD-10-CM | POA: Diagnosis not present

## 2024-08-17 DIAGNOSIS — I824Y2 Acute embolism and thrombosis of unspecified deep veins of left proximal lower extremity: Secondary | ICD-10-CM | POA: Diagnosis not present

## 2024-08-17 LAB — GLUCOSE, CAPILLARY
Glucose-Capillary: 142 mg/dL — ABNORMAL HIGH (ref 70–99)
Glucose-Capillary: 161 mg/dL — ABNORMAL HIGH (ref 70–99)
Glucose-Capillary: 137 mg/dL — ABNORMAL HIGH (ref 70–99)
Glucose-Capillary: 146 mg/dL — ABNORMAL HIGH (ref 70–99)

## 2024-08-17 MED ORDER — OXYCODONE HCL 5 MG PO TABS
5.0000 mg | ORAL_TABLET | ORAL | Status: DC | PRN
  Administered 2024-08-18 – 2024-09-03 (×29): 5 mg via ORAL
  Filled 2024-08-17 (×18): qty 1

## 2024-08-17 NOTE — Progress Notes (Signed)
 " Progress Note    Abigail [name]   FMW:[id number]  DOB: 1936-04-19  DOA: 08/03/2024     14 PCP: [name], FNP  Initial CC: Weakness  Hospital Course: [name] is an 88 year old female with PMH metastatic non-small lung cancer with mets to brain, currently on radiation treatment, chronic hypoxic respiratory failure on 3 L O2, CKD stage IIIb, 2 skin cancers on her legs s/p resection, HTN, CAD, COPD, chronic normocytic anemia and hypothyroidism.   Patient was admitted with generalized weakness and severe anemia, hemoglobin of 6.7 g/dL.  Patient has been transfused 2 units of packed red blood cells.  Weakness is thought to be multifactorial.   Workup revealed Pseudomonas UTI and sigmoid diverticulitis with presumed abscess concerning for focal/contained perforation.  General surgery also consulted on admission. Imaging studies also showed severe left hydronephrosis and hydroureter with ureter dilated to the level of the pelvis/area of inflammatory change at the sigmoid colon.  Creatinine also elevated at 2.2 on admission due to concern for bladder outlet obstruction. Lower extremity duplex also showed age-indeterminate DVT involving RLE and acute DVT involving LLE.   Assessment & Plan:   AKI on CKD stage IIIb BOO/Left hydronephrosis - Baseline creatinine around 1.6 - Admitted with serum creatinine of 2.2 -Poor oral intake prior to presentation - Renal ultrasound shows severe left hydronephrosis; also noted on CT was hydroureter with ureter dilated to the level of the pelvis/area of inflammatory change at the sigmoid - Urology following, appreciate assistance.  Tentative plan for potential stent placement if creatinine declines or fails to improve - Creatinine improved on 12/17, therefore urology holding off on stent placement at this time as showing improvement - Renal ultrasound repeated on 08/11/2024.  Moderate to severe but stable left hydronephrosis; renal function continues to  improve as well - Discussed with urology.  TOV started 12/19; so far voiding well without foley - trend BMP post foley removal  -Eventual outpatient follow-up with urology for repeat imaging again   Diverticulitis with abscess - CT on 08/04/2024 shows diffuse diverticular disease of the colon with presumed sigmoid diverticulitis and gas fluid collection involving left pelvic sidewall measuring 5.9 cm concerning for focal contained perforation or abscess - General Surgery following, no recommendations for intervention at this time.  Fluid collection is near iliac bone and not amenable for drainage - 2 weeks antibiotics recommended per surgery - transition abx to Augmentin  to complete course  - Diet has been advanced  Bilateral LE DVT - Significant edema noted in LLE and duplex shows acute DVT involving left common femoral vein, SF junction, femoral vein, proximal profunda vein, popliteal vein, posterior tibial vein, peroneal vein, external iliac vein - Age-indeterminate RLE DVT involving right popliteal, posterior tibial, peroneal veins - resume Lovenox  on 12/17 as no plans for stent/procedures  - transition to Eliquis  on 12/19 for ease of administration; poor candidate for Lovenox  at home due to cognitive issues  LLE SVT - Duplex also noted superficial venous thrombosis involving left great saphenous vein and acute intramuscular thrombosis involving left gastrocnemius veins and left soleal veins  Metastatic non-small cell lung cancer with solitary cerebral brain metastasis -Follows with Dr. Sherrod and rad onc.  Completed concurrent chemoradiation and 1 year of Durvalumab  consolidation therapy.   - Currently on stereotactic radiosurgery for brain mets.  She completed the course of Decadron .   - Plan of care also following, appreciate assistance.  Patient has difficulty partaking with GOC discussions and adequate comprehension/concentration centered around discussions -Remaining  full code and  full scope for now but needs outpatient palliative care and/or hospice to continue following at discharge  Physical deconditioning -Patient lives with son but in need of SNF; ultimately would benefit from LTC especially with memory problems lately  - will ask for PT/OT to remain on team given no longer pursuing comfort care - denied for SNF; denied by [name] as well; likely will need max HH arranged and still will need hospice to follow at home on discharge as well - appeal sent for SNF denial in efforts to help patient get to rehab at least short term given multiple acute issues as noted above; she is not at baseline as she was at home with [facility] previously and has potential for rehabing    Chronic hypoxic respiratory failure/COPD -Currently not on exacerbation.  On 3 L of oxygen  chronically at home.  Continue bronchodilators, inhalers  Acute on chronic normocytic anemia -Baseline hemoglobin around 7 - 8 g/dL - Admitted with hemoglobin of 6.8 g/dL.  Likely associated with malignancy.  No evidence of acute blood loss.   - s/p 2 units PRBC since admission  -Continue ferrous sulfate .   Hypertension - Blood pressure is controlled - Takes metoprolol  at home.  Currently on hold   CAD Elevated troponin  -On aspirin , Lipitor , Repatha .  No anginal symptoms. Takes aspirin  at home, currently on hold, will resume on discharge if no evidence of GI bleed. Denies any chest pain today.  Elevated troponin is likely from demand ischemia from anemia.   History of skin cancer -Underwent resection of the skin tumor on the left leg on October of this year.  Wound care consulted.  Wound appears to be slightly infected with purulent material.  Patient is on IV meropenem    History of recurrent UTI -On chronic therapy with nitrofurantoin . She has  noted frequency, dysuria symptoms.  Also complained of left lower quadrant pain.  Checking CT abdomen/pelvis without contrast.  Started on ceftriaxone .  Urine culture  showing 30,000 colonies of Pseudomonas along with mixed organisms. - s/p meropenem  initially   Interval History:  Daughter has appealed denial. I have written letter to try and help process as well. I have still warned them to make preparations if appeal is declined. It's unfortunate that son lives with here and does not want to be involved in her care much.  She does not have comfort needs to warrant [facility] nor is imminently dying yet.  They cannot afford out of pocket LTC either at this time.  Best hope is some rehab to get strong enough again to return home to prior level of functioning which she is clearly not at currently.    Antimicrobials: Rocephin  08/03/2024 >> 08/05/2024 Flagyl  08/04/2024 >> 08/08/2024 Meropenem  08/06/2024 >> 08/10/2024 Augmentin  08/10/2024 >> current  DVT prophylaxis:  SCDs Start: 08/03/24 1141 apixaban  (ELIQUIS ) tablet 5 mg   Code Status:   Code Status: Full Code  Mobility Assessment (Last 72 Hours)     Mobility Assessment     Row Name 08/17/24 1406 08/17/24 1010 08/16/24 2000 08/16/24 1600 08/16/24 1115   Does the patient have exclusion criteria? -- No- Perform mobility assessment No- Perform mobility assessment -- No- Perform mobility assessment   What is the highest level of mobility based on the mobility assessment? Level 4 (Ambulates with assistance) - Balance while stepping forward/back - Complete Level 4 (Ambulates with assistance) - Balance while stepping forward/back - Complete Level 4 (Ambulates with assistance) - Balance while stepping forward/back -  Complete Level 4 (Ambulates with assistance) - Balance while stepping forward/back - Complete Level 4 (Ambulates with assistance) - Balance while stepping forward/back - Complete   Is the above level different from baseline mobility prior to current illness? -- Yes - Recommend PT order Yes - Recommend PT order -- Yes - Recommend PT order    Row Name 08/15/24 2000 08/15/24 0840 08/14/24 2145        Does the patient have exclusion criteria? No- Perform mobility assessment No- Perform mobility assessment No- Perform mobility assessment     What is the highest level of mobility based on the mobility assessment? Level 4 (Ambulates with assistance) - Balance while stepping forward/back - Complete Level 3 (Stands with assistance) - Balance while standing  and cannot march in place Level 3 (Stands with assistance) - Balance while standing  and cannot march in place     Is the above level different from baseline mobility prior to current illness? Yes - Recommend PT order Yes - Recommend PT order Yes - Recommend PT order        Diet: Diet Orders (From admission, onward)     Start     Ordered   08/12/24 0732  Diet regular Room service appropriate? Yes; Fluid consistency: Thin  Diet effective now       Question Answer Comment  Room service appropriate? Yes   Fluid consistency: Thin      08/12/24 0731            Barriers to discharge: Patient changing mind regarding GOC Disposition Plan: Home with home health and hospice HH orders placed: TBD Status is: Inpatient  Objective: Blood pressure (!) 155/59, pulse 81, temperature 98 F (36.7 C), temperature source Oral, resp. rate 17, height 5' 6 (1.676 m), weight 71.5 kg, SpO2 94%.  Examination:  Physical Exam Constitutional:      Appearance: Normal appearance.  HENT:     Head: Normocephalic and atraumatic.     Mouth/Throat:     Mouth: Mucous membranes are moist.  Eyes:     Extraocular Movements: Extraocular movements intact.  Cardiovascular:     Rate and Rhythm: Normal rate and regular rhythm.  Pulmonary:     Effort: Pulmonary effort is normal. No respiratory distress.     Breath sounds: Normal breath sounds. No wheezing.  Abdominal:     General: Bowel sounds are normal. There is no distension.     Palpations: Abdomen is soft.     Tenderness: There is no abdominal tenderness.  Musculoskeletal:        General: Normal  range of motion.     Cervical back: Normal range of motion and neck supple.     Left lower leg: Edema (4+ pitting edema throughout LLE) present.  Skin:    General: Skin is warm and dry.  Neurological:     General: No focal deficit present.     Mental Status: She is alert.  Psychiatric:        Attention and Perception: She is inattentive.        Speech: Speech is tangential.        Cognition and Memory: Cognition is impaired. Memory is impaired. She exhibits impaired recent memory.      Consultants:  Palliative care General Surgery-signed off Urology  Procedures:    Data Reviewed: Results for orders placed or performed during the hospital encounter of 08/03/24 (from the past 24 hours)  Glucose, capillary     Status: Abnormal  Collection Time: 08/16/24 10:11 PM  Result Value Ref Range   Glucose-Capillary 188 (H) 70 - 99 mg/dL  Glucose, capillary     Status: Abnormal   Collection Time: 08/17/24  8:14 AM  Result Value Ref Range   Glucose-Capillary 142 (H) 70 - 99 mg/dL  Glucose, capillary     Status: Abnormal   Collection Time: 08/17/24 11:36 AM  Result Value Ref Range   Glucose-Capillary 161 (H) 70 - 99 mg/dL  Glucose, capillary     Status: Abnormal   Collection Time: 08/17/24  4:18 PM  Result Value Ref Range   Glucose-Capillary 137 (H) 70 - 99 mg/dL    I have reviewed pertinent nursing notes, vitals, labs, and images as necessary. I have ordered labwork to follow up on as indicated.  I have reviewed the last notes from staff over past 24 hours. I have discussed patient's care plan and test results with nursing staff, CM/SW, and other staff as appropriate.  Old records reviewed in assessment of this patient  Time spent: Greater than 50% of the 55 minute visit was spent in counseling/coordination of care for the patient as laid out in the A&P.   LOS: 14 days   Alm Apo, MD [facility] 08/17/2024, 4:58 PM "

## 2024-08-17 NOTE — TOC Progression Note (Addendum)
 Transition of Care [facility]) - Progression Note    Patient Details  Name: Abigail [name] MRN: [id number] Date of Birth: Jul 30, 1936  Transition of Care [facility]) CM/SW Contact  Doneta Glenys DASEN, RN Phone Number: 08/17/2024, 3:02 PM  Clinical Narrative:    4:34 PM Dr. Patsy wrote an appeal letter. CM faxed the appeal letter with 08/17/2024 PT note to [facility] [employer] [phone]). Confirmation received that fax was successful. CM spoke with patient and Kelly(dlt) at the bedside to update on the the behind the scene work for getting patient into a SNF.  3:37 PM CM met with Dr. Patsy to discuss a fast appeal on behalf of the patient.  3:02 PM CM spoke with patient and daughter at the bedside. Kelly(dlt)  stated she started the appeal for SNF process yesterday, however does not know the statues. Burnard is very concerned that if patient goes home even with exhausting all resources (PT/OT/RN/Aid/SW) for [facility] that she will be at risk for a fall. Patient has a son living in the home currently that work at the [employer] the [employer] and is not able to assist with patients care. CM ask them to think about hiring a PCA. Patient and Burnard stated they don't have income for PCA. Encouraged them to reach out to their family and church family. That didn't seem to be an option for them.  CM called [employer] for update on appeal. Jori will email appeal status to me.     Expected Discharge Plan: Home w Home Health Services Barriers to Discharge: Continued Medical Work up               Expected Discharge Plan and Services       Living arrangements for the past 2 months: Single Family Home                                       Social Drivers of Health (SDOH) Interventions SDOH Screenings   Food Insecurity: No Food Insecurity (08/03/2024)  Housing: Low Risk (08/03/2024)  Transportation Needs: No Transportation Needs (08/03/2024)  Utilities: Not At Risk  (08/03/2024)  Alcohol Screen: Low Risk (03/09/2024)  Depression (PHQ2-9): Low Risk (07/01/2024)  Financial Resource Strain: Medium Risk (03/09/2024)  Physical Activity: Inactive (03/09/2024)  Social Connections: Moderately Isolated (08/03/2024)  Stress: No Stress Concern Present (03/09/2024)  Recent Concern: Stress - Stress Concern Present (03/09/2024)  Tobacco Use: Medium Risk (08/05/2024)  Health Literacy: Adequate Health Literacy (03/09/2024)    Readmission Risk Interventions    06/07/2024    3:57 PM  Readmission Risk Prevention Plan  Transportation Screening Complete  PCP or Specialist Appt within 3-5 Days Complete  HRI or Home Care Consult Complete  Social Work Consult for Recovery Care Planning/Counseling Complete  Palliative Care Screening Complete  Medication Review Oceanographer) Referral to Pharmacy

## 2024-08-17 NOTE — Progress Notes (Signed)
Physical Therapy Treatment Patient Details Name: Abigail [name] MRN: [id number] DOB: Aug 14, 1936 Today's Date: 08/17/2024   History of Present Illness Pt is an 88y.o female admitted on 08/03/24 with generalized weakness. She fell at home while trying to put her pants on and was unable to get up. PMH includes: metastatic non-small lung cancer to brain, currently on radiation treatment, chronic hypoxic respiratory failure on 3 L O2, CKD stage IIIb, 2 skin cancers on her legs s/p resection, hypertension, coronary artery disease, chronic normocytic anemia, hypothyroidism.    PT Comments  Unfortunately, Pt is not progressing with her functional mobility.  Very limited activity tolerance.  Also presents with increased ST memory impairment and MAX c/o feeling overall Tired.  HgB  7.5 and RBC 2.87 on 12/21.  Very sweet Lady and follows all commands but easily forgetful.   Also, L LE is visibly larger from hip to toes.   Pt requiring assist to move.  Assisted OOB to amb to the bathroom was difficult.  General bed mobility comments: Required increased assist this session due to increased c/o weakness/fatigue.  Increased difficulty lifting/moving L LE due to increased EDEMA.  Pt required increased assist to rise.  General transfer comment: Required increased assist to rise from elevated bed due to feeling tired.  From lower toilet level, Pt required Max Assist as she was unable despite several attempts.  Assisted with peri due to balance instability as Pt became more fatigued with increased activity.  Too tired to stand at sink to wash hands after using toilet. General Gait Details: Very limited amb tolerance/distance 9 feet x 2 due to increassed c/o B LE weakness and fatigue.  Poor forward flexed posture.  Unsteady with turns and back steps.  Increased unsteadiiness with increased activity.  Remained on 3 lts with avg sats 94%.  HIGH FALL RISK.  Assisted back to bed and positioned to comfort.     If plan is  discharge home, recommend the following: A little help with walking and/or transfers;A little help with bathing/dressing/bathroom;Assist for transportation;Assistance with cooking/housework   Can travel by private vehicle     No  Equipment Recommendations  None recommended by PT    Recommendations for Other Services       Precautions / Restrictions Precautions Precautions: Fall Precaution/Restrictions Comments: O2 dep at baseline-3L Restrictions Weight Bearing Restrictions Per Provider Order: No Other Position/Activity Restrictions: uses 3L O2 all the time at her baseline     Mobility  Bed Mobility Overal bed mobility: Needs Assistance Bed Mobility: Supine to Sit, Sit to Supine     Supine to sit: Mod assist, Max assist Sit to supine: Max assist, Total assist   General bed mobility comments: Required increased assist this session due to increased c/o weakness/fatigue.  Increased difficulty lifting/moving L LE due to increased EDEMA.    Transfers Overall transfer level: Needs assistance Equipment used: Rolling walker (2 wheels) Transfers: Sit to/from Stand, Bed to chair/wheelchair/BSC Sit to Stand: Mod assist, Max assist           General transfer comment: Required increased assist to rise from elevated bed due to feeling tired.  From lower toilet level, Pt required Max Assist as she was unable despite several attempts.  Assisted with peri due to balance instability as Pt became more fatigued with increased activity.  Too tired to stand at sink to wash hands after using toilet.    Ambulation/Gait Ambulation/Gait assistance: Mod assist, Max assist Gait Distance (Feet): 18 Feet Assistive device: Rolling  walker (2 wheels) Gait Pattern/deviations: Step-through pattern, Decreased stride length Gait velocity: decreased     General Gait Details: Very limited amb tolerance/distance 9 feet x 2 due to increassed c/o B LE weakness and fatigue.  Poor forward flexed posture.   Unsteady with turns and back steps.  Increased unsteadiiness with increased activity.  Remained on 3 lts with avg sats 94%.  HIGH FALL RISK.   Stairs             Wheelchair Mobility     Tilt Bed    Modified Rankin (Stroke Patients Only)       Balance                                            Communication Communication Communication: No apparent difficulties  Cognition Arousal: Alert Behavior During Therapy: WFL for tasks assessed/performed   PT - Cognitive impairments: No apparent impairments                       PT - Cognition Comments: AxO x 2.5 sweet Lady but also presents with poor ST Memory/recall. Following commands: Intact Following commands impaired: Follows one step commands with increased time    Cueing Cueing Techniques: Verbal cues  Exercises      General Comments        Pertinent Vitals/Pain Pain Assessment Pain Assessment: Faces Pain Location: abdomen Pain Descriptors / Indicators: Discomfort, Aching Pain Intervention(s): Monitored during session, Repositioned    Home Living                          Prior Function            PT Goals (current goals can now be found in the care plan section)      Frequency    Min 2X/week      PT Plan      Co-evaluation              AM-PAC PT 6 Clicks Mobility   Outcome Measure  Help needed turning from your back to your side while in a flat bed without using bedrails?: A Lot Help needed moving from lying on your back to sitting on the side of a flat bed without using bedrails?: A Lot Help needed moving to and from a bed to a chair (including a wheelchair)?: A Lot Help needed standing up from a chair using your arms (e.g., wheelchair or bedside chair)?: A Lot Help needed to walk in hospital room?: A Lot Help needed climbing 3-5 steps with a railing? : Total 6 Click Score: 11    End of Session Equipment Utilized During Treatment: Gait  belt;Oxygen  Activity Tolerance: Patient limited by fatigue Patient left: with call bell/phone within reach;with family/visitor present;in bed;with bed alarm set Nurse Communication: Mobility status PT Visit Diagnosis: Unsteadiness on feet (R26.81);Muscle weakness (generalized) (M62.81);Difficulty in walking, not elsewhere classified (R26.2)     Time: 8685-8661 PT Time Calculation (min) (ACUTE ONLY): 24 min  Charges:    $Gait Training: 8-22 mins $Therapeutic Activity: 8-22 mins PT General Charges $$ ACUTE PT VISIT: 1 Visit                     Katheryn Leap  PTA Acute  Rehabilitation Services Office M-F  [phone] ° ° °

## 2024-08-18 DIAGNOSIS — N179 Acute kidney failure, unspecified: Secondary | ICD-10-CM | POA: Diagnosis not present

## 2024-08-18 DIAGNOSIS — K5720 Diverticulitis of large intestine with perforation and abscess without bleeding: Secondary | ICD-10-CM | POA: Diagnosis not present

## 2024-08-18 DIAGNOSIS — N1832 Chronic kidney disease, stage 3b: Secondary | ICD-10-CM | POA: Diagnosis not present

## 2024-08-18 LAB — GLUCOSE, CAPILLARY
Glucose-Capillary: 129 mg/dL — ABNORMAL HIGH (ref 70–99)
Glucose-Capillary: 180 mg/dL — ABNORMAL HIGH (ref 70–99)
Glucose-Capillary: 185 mg/dL — ABNORMAL HIGH (ref 70–99)
Glucose-Capillary: 160 mg/dL — ABNORMAL HIGH (ref 70–99)

## 2024-08-18 MED ORDER — ONDANSETRON 4 MG PO TBDP
4.0000 mg | ORAL_TABLET | Freq: Three times a day (TID) | ORAL | Status: DC | PRN
  Administered 2024-08-18 – 2024-08-27 (×3): 4 mg via ORAL
  Filled 2024-08-18: qty 1

## 2024-08-18 MED ORDER — INSULIN ASPART 100 UNIT/ML IJ SOLN
0.0000 [IU] | Freq: Three times a day (TID) | INTRAMUSCULAR | Status: DC
  Administered 2024-08-25 – 2024-09-01 (×5): 1 [IU] via SUBCUTANEOUS
  Administered 2024-09-01: 2 [IU] via SUBCUTANEOUS
  Filled 2024-08-18: qty 2
  Filled 2024-08-18 (×2): qty 1

## 2024-08-18 MED ORDER — INSULIN ASPART 100 UNIT/ML IJ SOLN: 0.0000 [IU] | Freq: Every day | INTRAMUSCULAR | Status: DC

## 2024-08-18 NOTE — Progress Notes (Signed)
 " Progress Note    Abigail [name]   FMW:[id number]  DOB: 1935-12-31  DOA: 08/03/2024     15 PCP: Abigail Speaks, FNP  Initial CC: Weakness  Hospital Course: [name] is an 88 year old female with PMH metastatic non-small lung cancer with mets to brain, currently on radiation treatment, chronic hypoxic respiratory failure on 3 L O2, CKD stage IIIb, 2 skin cancers on her legs s/p resection, HTN, CAD, COPD, chronic normocytic anemia and hypothyroidism.   Patient was admitted with generalized weakness and severe anemia, hemoglobin of 6.7 g/dL.  Patient has been transfused 2 units of packed red blood cells.  Weakness is thought to be multifactorial.   Workup revealed Pseudomonas UTI and sigmoid diverticulitis with presumed abscess concerning for focal/contained perforation.  General surgery also consulted on admission. Imaging studies also showed severe left hydronephrosis and hydroureter with ureter dilated to the level of the pelvis/area of inflammatory change at the sigmoid colon.  Creatinine also elevated at 2.2 on admission due to concern for bladder outlet obstruction. Lower extremity duplex also showed age-indeterminate DVT involving RLE and acute DVT involving LLE.   Assessment & Plan:   AKI on CKD stage IIIb BOO/Left hydronephrosis - Baseline creatinine around 1.6 - Admitted with serum creatinine of 2.2 -Poor oral intake prior to presentation - Renal ultrasound shows severe left hydronephrosis; also noted on CT was hydroureter with ureter dilated to the level of the pelvis/area of inflammatory change at the sigmoid - Urology following, appreciate assistance.  Tentative plan for potential stent placement if creatinine declines or fails to improve - Creatinine improved on 12/17, therefore urology holding off on stent placement at this time as showing improvement - Renal ultrasound repeated on 08/11/2024.  Moderate to severe but stable left hydronephrosis; renal function continues to  improve as well - Discussed with urology.  TOV started 12/19; so far voiding well without foley - trend BMP post foley removal  -Eventual outpatient follow-up with urology for repeat imaging again   Diverticulitis with abscess - CT on 08/04/2024 shows diffuse diverticular disease of the colon with presumed sigmoid diverticulitis and gas fluid collection involving left pelvic sidewall measuring 5.9 cm concerning for focal contained perforation or abscess - General Surgery following, no recommendations for intervention at this time.  Fluid collection is near iliac bone and not amenable for drainage - 2 weeks antibiotics recommended per surgery - transition abx to Augmentin  to complete course  - Diet has been advanced  Bilateral LE DVT - Significant edema noted in LLE and duplex shows acute DVT involving left common femoral vein, SF junction, femoral vein, proximal profunda vein, popliteal vein, posterior tibial vein, peroneal vein, external iliac vein - Age-indeterminate RLE DVT involving right popliteal, posterior tibial, peroneal veins - resume Lovenox  on 12/17 as no plans for stent/procedures  - transition to Eliquis  on 12/19 for ease of administration; poor candidate for Lovenox  at home due to cognitive issues  LLE SVT - Duplex also noted superficial venous thrombosis involving left great saphenous vein and acute intramuscular thrombosis involving left gastrocnemius veins and left soleal veins  Metastatic non-small cell lung cancer with solitary cerebral brain metastasis -Follows with Dr. Sherrod and rad onc.  Completed concurrent chemoradiation and 1 year of Durvalumab  consolidation therapy.   - Currently on stereotactic radiosurgery for brain mets.  She completed the course of Decadron .   - Plan of care also following, appreciate assistance.  Patient has difficulty partaking with GOC discussions and adequate comprehension/concentration centered around discussions -Remaining  full code and  full scope for now but needs outpatient palliative care and/or hospice to continue following at discharge  Physical deconditioning -Patient lives with son but in need of SNF; ultimately would benefit from LTC especially with memory problems lately  - will ask for PT/OT to remain on team given no longer pursuing comfort care - denied for SNF; denied by [name] as well; likely will need max HH arranged and still will need hospice to follow at home on discharge as well - appeal sent for SNF denial in efforts to help patient get to rehab at least short term given multiple acute issues as noted above; she is not at baseline as she was at home with [facility] previously and has potential for rehabing    Chronic hypoxic respiratory failure/COPD -Currently not on exacerbation.  On 3 L of oxygen  chronically at home.  Continue bronchodilators, inhalers  Acute on chronic normocytic anemia -Baseline hemoglobin around 7 - 8 g/dL - Admitted with hemoglobin of 6.8 g/dL.  Likely associated with malignancy.  No evidence of acute blood loss.   - s/p 2 units PRBC since admission  -Continue ferrous sulfate .   Hypertension - Blood pressure is controlled - Takes metoprolol  at home.  Currently on hold   CAD Elevated troponin  -On aspirin , Lipitor , Repatha .  No anginal symptoms. Takes aspirin  at home, currently on hold, will resume on discharge if no evidence of GI bleed. Denies any chest pain today.  Elevated troponin is likely from demand ischemia from anemia.   History of skin cancer -Underwent resection of the skin tumor on the left leg on October of this year.  Wound care consulted.  Wound appears to be slightly infected with purulent material.  Patient is on IV meropenem    History of recurrent UTI -On chronic therapy with nitrofurantoin . She has  noted frequency, dysuria symptoms.  Also complained of left lower quadrant pain.  Checking CT abdomen/pelvis without contrast.  Started on ceftriaxone .  Urine culture  showing 30,000 colonies of Pseudomonas along with mixed organisms. - s/p meropenem  initially   Interval History:  Daughter has appealed denial. I have written letter to try and help process as well. I have still warned them to make preparations if appeal is declined. It's unfortunate that son lives with here and does not want to be involved in her care much.  She does not have comfort needs to warrant [facility] nor is imminently dying yet.  They cannot afford out of pocket LTC either at this time.  Best hope is some rehab to get strong enough again to return home to prior level of functioning which she is clearly not at currently.  No changes since yesterday.  Daughter present bedside this morning and updated as well.    Antimicrobials: Rocephin  08/03/2024 >> 08/05/2024 Flagyl  08/04/2024 >> 08/08/2024 Meropenem  08/06/2024 >> 08/10/2024 Augmentin  08/10/2024 >> current  DVT prophylaxis:  SCDs Start: 08/03/24 1141 apixaban  (ELIQUIS ) tablet 5 mg   Code Status:   Code Status: Full Code  Mobility Assessment (Last 72 Hours)     Mobility Assessment     Row Name 08/18/24 0941 08/17/24 2034 08/17/24 1406 08/17/24 1010 08/16/24 2000   Does the patient have exclusion criteria? No- Perform mobility assessment No- Perform mobility assessment -- No- Perform mobility assessment No- Perform mobility assessment   What is the highest level of mobility based on the mobility assessment? Level 4 (Ambulates with assistance) - Balance while stepping forward/back - Complete Level 4 (Ambulates with  assistance) - Balance while stepping forward/back - Complete Level 4 (Ambulates with assistance) - Balance while stepping forward/back - Complete Level 4 (Ambulates with assistance) - Balance while stepping forward/back - Complete Level 4 (Ambulates with assistance) - Balance while stepping forward/back - Complete   Is the above level different from baseline mobility prior to current illness? Yes - Recommend PT  order Yes - Recommend PT order -- Yes - Recommend PT order Yes - Recommend PT order    Row Name 08/16/24 1600 08/16/24 1115 08/15/24 2000       Does the patient have exclusion criteria? -- No- Perform mobility assessment No- Perform mobility assessment     What is the highest level of mobility based on the mobility assessment? Level 4 (Ambulates with assistance) - Balance while stepping forward/back - Complete Level 4 (Ambulates with assistance) - Balance while stepping forward/back - Complete Level 4 (Ambulates with assistance) - Balance while stepping forward/back - Complete     Is the above level different from baseline mobility prior to current illness? -- Yes - Recommend PT order Yes - Recommend PT order        Diet: Diet Orders (From admission, onward)     Start     Ordered   08/12/24 0732  Diet regular Room service appropriate? Yes; Fluid consistency: Thin  Diet effective now       Question Answer Comment  Room service appropriate? Yes   Fluid consistency: Thin      08/12/24 0731            Barriers to discharge: Patient changing mind regarding GOC Disposition Plan: Home with home health and hospice HH orders placed: TBD Status is: Inpatient  Objective: Blood pressure (!) 114/40, pulse 72, temperature 98.3 F (36.8 C), resp. rate 15, height 5' 6 (1.676 m), weight 71.5 kg, SpO2 96%.  Examination:  Physical Exam Constitutional:      Appearance: Normal appearance.  HENT:     Head: Normocephalic and atraumatic.     Mouth/Throat:     Mouth: Mucous membranes are moist.  Eyes:     Extraocular Movements: Extraocular movements intact.  Cardiovascular:     Rate and Rhythm: Normal rate and regular rhythm.  Pulmonary:     Effort: Pulmonary effort is normal. No respiratory distress.     Breath sounds: Normal breath sounds. No wheezing.  Abdominal:     General: Bowel sounds are normal. There is no distension.     Palpations: Abdomen is soft.     Tenderness: There is no  abdominal tenderness.  Musculoskeletal:        General: Normal range of motion.     Cervical back: Normal range of motion and neck supple.     Left lower leg: Edema (4+ pitting edema throughout LLE) present.  Skin:    General: Skin is warm and dry.  Neurological:     General: No focal deficit present.     Mental Status: She is alert.  Psychiatric:        Attention and Perception: She is inattentive.        Speech: Speech is tangential.        Cognition and Memory: Cognition is impaired. Memory is impaired. She exhibits impaired recent memory.      Consultants:  Palliative care General Surgery-signed off Urology  Procedures:    Data Reviewed: Results for orders placed or performed during the hospital encounter of 08/03/24 (from the past 24 hours)  Glucose, capillary  Status: Abnormal   Collection Time: 08/17/24  4:18 PM  Result Value Ref Range   Glucose-Capillary 137 (H) 70 - 99 mg/dL  Glucose, capillary     Status: Abnormal   Collection Time: 08/17/24  9:12 PM  Result Value Ref Range   Glucose-Capillary 146 (H) 70 - 99 mg/dL  Glucose, capillary     Status: Abnormal   Collection Time: 08/18/24  7:32 AM  Result Value Ref Range   Glucose-Capillary 129 (H) 70 - 99 mg/dL  Glucose, capillary     Status: Abnormal   Collection Time: 08/18/24 11:39 AM  Result Value Ref Range   Glucose-Capillary 180 (H) 70 - 99 mg/dL    I have reviewed pertinent nursing notes, vitals, labs, and images as necessary. I have ordered labwork to follow up on as indicated.  I have reviewed the last notes from staff over past 24 hours. I have discussed patient's care plan and test results with nursing staff, CM/SW, and other staff as appropriate.  Old records reviewed in assessment of this patient  Time spent: Greater than 50% of the 55 minute visit was spent in counseling/coordination of care for the patient as laid out in the A&P.   LOS: 15 days   Alm Apo, MD [facility] 08/18/2024, 2:29 PM "

## 2024-08-18 NOTE — Progress Notes (Signed)
 Pt transferred with her belongings, daughter informed about new room number.

## 2024-08-19 ENCOUNTER — Telehealth: Payer: Self-pay

## 2024-08-19 DIAGNOSIS — K5720 Diverticulitis of large intestine with perforation and abscess without bleeding: Secondary | ICD-10-CM | POA: Diagnosis not present

## 2024-08-19 DIAGNOSIS — N3000 Acute cystitis without hematuria: Secondary | ICD-10-CM | POA: Diagnosis not present

## 2024-08-19 DIAGNOSIS — N1330 Unspecified hydronephrosis: Secondary | ICD-10-CM | POA: Diagnosis not present

## 2024-08-19 DIAGNOSIS — N179 Acute kidney failure, unspecified: Secondary | ICD-10-CM | POA: Diagnosis not present

## 2024-08-19 LAB — CBC WITH DIFFERENTIAL/PLATELET
Abs Immature Granulocytes: 0.21 K/uL — ABNORMAL HIGH (ref 0.00–0.07)
Basophils Absolute: 0 K/uL (ref 0.0–0.1)
Basophils Relative: 1 %
Eosinophils Absolute: 0.1 K/uL (ref 0.0–0.5)
Eosinophils Relative: 1 %
HCT: 23.6 % — ABNORMAL LOW (ref 36.0–46.0)
Hemoglobin: 7 g/dL — ABNORMAL LOW (ref 12.0–15.0)
Immature Granulocytes: 3 %
Lymphocytes Relative: 5 %
Lymphs Abs: 0.4 K/uL — ABNORMAL LOW (ref 0.7–4.0)
MCH: 27.6 pg (ref 26.0–34.0)
MCHC: 29.7 g/dL — ABNORMAL LOW (ref 30.0–36.0)
MCV: 92.9 fL (ref 80.0–100.0)
Monocytes Absolute: 0.9 K/uL (ref 0.1–1.0)
Monocytes Relative: 11 %
Neutro Abs: 6.4 K/uL (ref 1.7–7.7)
Neutrophils Relative %: 79 %
Platelets: 311 K/uL (ref 150–400)
RBC: 2.54 MIL/uL — ABNORMAL LOW (ref 3.87–5.11)
RDW: 17.6 % — ABNORMAL HIGH (ref 11.5–15.5)
WBC: 8 K/uL (ref 4.0–10.5)
nRBC: 0 % (ref 0.0–0.2)

## 2024-08-19 LAB — GLUCOSE, CAPILLARY
Glucose-Capillary: 126 mg/dL — ABNORMAL HIGH (ref 70–99)
Glucose-Capillary: 139 mg/dL — ABNORMAL HIGH (ref 70–99)
Glucose-Capillary: 181 mg/dL — ABNORMAL HIGH (ref 70–99)
Glucose-Capillary: 133 mg/dL — ABNORMAL HIGH (ref 70–99)
Glucose-Capillary: 157 mg/dL — ABNORMAL HIGH (ref 70–99)

## 2024-08-19 LAB — BASIC METABOLIC PANEL WITH GFR
Anion gap: 7 (ref 5–15)
BUN: 26 mg/dL — ABNORMAL HIGH (ref 8–23)
CO2: 25 mmol/L (ref 22–32)
Calcium: 8.6 mg/dL — ABNORMAL LOW (ref 8.9–10.3)
Chloride: 107 mmol/L (ref 98–111)
Creatinine, Ser: 1.46 mg/dL — ABNORMAL HIGH (ref 0.44–1.00)
GFR, Estimated: 35 mL/min — ABNORMAL LOW
Glucose, Bld: 147 mg/dL — ABNORMAL HIGH (ref 70–99)
Potassium: 5 mmol/L (ref 3.5–5.1)
Sodium: 139 mmol/L (ref 135–145)

## 2024-08-19 NOTE — Progress Notes (Signed)
 " Progress Note    Abigail [name]   FMW:[id number]  DOB: 1935-10-24  DOA: 08/03/2024     16 PCP: [name], FNP  Initial CC: Weakness  Hospital Course: Abigail [name] is an 88 year old female with PMH metastatic non-small lung cancer with mets to brain, currently on radiation treatment, chronic hypoxic respiratory failure on 3 L O2, CKD stage IIIb, 2 skin cancers on her legs s/p resection, HTN, CAD, COPD, chronic normocytic anemia and hypothyroidism.   Patient was admitted with generalized weakness and severe anemia, hemoglobin of 6.7 g/dL.  Patient has been transfused 2 units of packed red blood cells.  Weakness is thought to be multifactorial.   Workup revealed Pseudomonas UTI and sigmoid diverticulitis with presumed abscess concerning for focal/contained perforation.  General surgery also consulted on admission. Imaging studies also showed severe left hydronephrosis and hydroureter with ureter dilated to the level of the pelvis/area of inflammatory change at the sigmoid colon.  Creatinine also elevated at 2.2 on admission due to concern for bladder outlet obstruction. Lower extremity duplex also showed age-indeterminate DVT involving RLE and acute DVT involving LLE.   Assessment & Plan:   AKI on CKD stage IIIb BOO/Left hydronephrosis - Baseline creatinine around 1.6 - Admitted with serum creatinine of 2.2 -Poor oral intake prior to presentation - Renal ultrasound shows severe left hydronephrosis; also noted on CT was hydroureter with ureter dilated to the level of the pelvis/area of inflammatory change at the sigmoid - Urology following, appreciate assistance.  Tentative plan for potential stent placement if creatinine declines or fails to improve - Creatinine improved on 12/17, therefore urology holding off on stent placement at this time as showing improvement - Renal ultrasound repeated on 08/11/2024.  Moderate to severe but stable left hydronephrosis; renal function continues to  improve as well - Discussed with urology.  TOV started 12/19; so far voiding well without foley - trend BMP post foley removal  -Eventual outpatient follow-up with urology for repeat imaging again   Diverticulitis with abscess - CT on 08/04/2024 shows diffuse diverticular disease of the colon with presumed sigmoid diverticulitis and gas fluid collection involving left pelvic sidewall measuring 5.9 cm concerning for focal contained perforation or abscess - General Surgery following, no recommendations for intervention at this time.  Fluid collection is near iliac bone and not amenable for drainage - 2 weeks antibiotics recommended per surgery - transition abx to Augmentin  to complete course  - Diet has been advanced  Bilateral LE DVT - Significant edema noted in LLE and duplex shows acute DVT involving left common femoral vein, SF junction, femoral vein, proximal profunda vein, popliteal vein, posterior tibial vein, peroneal vein, external iliac vein - Age-indeterminate RLE DVT involving right popliteal, posterior tibial, peroneal veins - resume Lovenox  on 12/17 as no plans for stent/procedures  - transition to Eliquis  on 12/19 for ease of administration; poor candidate for Lovenox  at home due to cognitive issues  LLE SVT - Duplex also noted superficial venous thrombosis involving left great saphenous vein and acute intramuscular thrombosis involving left gastrocnemius veins and left soleal veins  Metastatic non-small cell lung cancer with solitary cerebral brain metastasis -Follows with Dr. Sherrod and rad onc.  Completed concurrent chemoradiation and 1 year of Durvalumab  consolidation therapy.   - Currently on stereotactic radiosurgery for brain mets.  She completed the course of Decadron .   - Plan of care also following, appreciate assistance.  Patient has difficulty partaking with GOC discussions and adequate comprehension/concentration centered around discussions -Remaining  full code and  full scope for now but needs outpatient palliative care and/or hospice to continue following at discharge  Physical deconditioning -Patient lives with son but in need of SNF; ultimately would benefit from LTC especially with memory problems lately  - will ask for PT/OT to remain on team given no longer pursuing comfort care - denied for SNF; denied by [name] as well; likely will need max HH arranged and still will need hospice to follow at home on discharge as well - appeal sent for SNF denial in efforts to help patient get to rehab at least short term given multiple acute issues as noted above; she is not at baseline as she was at home with [facility] previously and has potential for rehabing    Chronic hypoxic respiratory failure/COPD -Currently not on exacerbation.  On 3 L of oxygen  chronically at home.  Continue bronchodilators, inhalers  Acute on chronic normocytic anemia -Baseline hemoglobin around 7 - 8 g/dL - Admitted with hemoglobin of 6.8 g/dL.  Likely associated with malignancy.  No evidence of acute blood loss.   - s/p 2 units PRBC since admission  -Continue ferrous sulfate .   Hypertension - Blood pressure is controlled - Takes metoprolol  at home.  Currently on hold   CAD Elevated troponin  -On aspirin , Lipitor , Repatha .  No anginal symptoms. Takes aspirin  at home, currently on hold, will resume on discharge if no evidence of GI bleed. Denies any chest pain today.  Elevated troponin is likely from demand ischemia from anemia.   History of skin cancer -Underwent resection of the skin tumor on the left leg on October of this year.  Wound care consulted.  Wound appears to be slightly infected with purulent material.  Patient is on IV meropenem    History of recurrent UTI -On chronic therapy with nitrofurantoin . She has  noted frequency, dysuria symptoms.  Also complained of left lower quadrant pain.  Checking CT abdomen/pelvis without contrast.  Started on ceftriaxone .  Urine culture  showing 30,000 colonies of Pseudomonas along with mixed organisms. - s/p meropenem  initially   Interval History:  Daughter has appealed denial. I have written letter to try and help process as well. I have still warned them to make preparations if appeal is declined. It's unfortunate that son lives with here and does not want to be involved in her care much.  She does not have comfort needs to warrant [facility] nor is imminently dying yet.  They cannot afford out of pocket LTC either at this time.  Best hope is some rehab to get strong enough again to return home to prior level of functioning which she is clearly not at currently.  No changes since yesterday.   Friend present bedside this morning.  Patient asking mostly the same questions she asks daily, unfortunately in setting of memory impairment.    Antimicrobials: Rocephin  08/03/2024 >> 08/05/2024 Flagyl  08/04/2024 >> 08/08/2024 Meropenem  08/06/2024 >> 08/10/2024 Augmentin  08/10/2024 >> current  DVT prophylaxis:  SCDs Start: 08/03/24 1141 apixaban  (ELIQUIS ) tablet 5 mg   Code Status:   Code Status: Full Code  Mobility Assessment (Last 72 Hours)     Mobility Assessment     Row Name 08/19/24 1039 08/18/24 2200 08/18/24 1653 08/18/24 0941 08/17/24 2034   Does the patient have exclusion criteria? No- Perform mobility assessment No- Perform mobility assessment No- Perform mobility assessment No- Perform mobility assessment No- Perform mobility assessment   What is the highest level of mobility based on the mobility assessment?  Level 4 (Ambulates with assistance) - Balance while stepping forward/back - Complete Level 5 (Ambulates independently) - Balance while walking independently - Complete Level 4 (Ambulates with assistance) - Balance while stepping forward/back - Complete Level 4 (Ambulates with assistance) - Balance while stepping forward/back - Complete Level 4 (Ambulates with assistance) - Balance while stepping forward/back  - Complete   Is the above level different from baseline mobility prior to current illness? Yes - Recommend PT order Yes - Recommend PT order Yes - Recommend PT order Yes - Recommend PT order Yes - Recommend PT order    Row Name 08/17/24 1406 08/17/24 1010 08/16/24 2000 08/16/24 1600     Does the patient have exclusion criteria? -- No- Perform mobility assessment No- Perform mobility assessment --    What is the highest level of mobility based on the mobility assessment? Level 4 (Ambulates with assistance) - Balance while stepping forward/back - Complete Level 4 (Ambulates with assistance) - Balance while stepping forward/back - Complete Level 4 (Ambulates with assistance) - Balance while stepping forward/back - Complete Level 4 (Ambulates with assistance) - Balance while stepping forward/back - Complete    Is the above level different from baseline mobility prior to current illness? -- Yes - Recommend PT order Yes - Recommend PT order --       Diet: Diet Orders (From admission, onward)     Start     Ordered   08/12/24 0732  Diet regular Room service appropriate? Yes; Fluid consistency: Thin  Diet effective now       Question Answer Comment  Room service appropriate? Yes   Fluid consistency: Thin      08/12/24 0731            Barriers to discharge: Patient changing mind regarding GOC Disposition Plan: Home with home health and hospice HH orders placed: TBD Status is: Inpatient  Objective: Blood pressure (!) 147/63, pulse 72, temperature 97.7 F (36.5 C), temperature source Oral, resp. rate 17, height 5' 6 (1.676 m), weight 71.5 kg, SpO2 100%.  Examination:  Physical Exam Constitutional:      Appearance: Normal appearance.  HENT:     Head: Normocephalic and atraumatic.     Mouth/Throat:     Mouth: Mucous membranes are moist.  Eyes:     Extraocular Movements: Extraocular movements intact.  Cardiovascular:     Rate and Rhythm: Normal rate and regular rhythm.  Pulmonary:      Effort: Pulmonary effort is normal. No respiratory distress.     Breath sounds: Normal breath sounds. No wheezing.  Abdominal:     General: Bowel sounds are normal. There is no distension.     Palpations: Abdomen is soft.     Tenderness: There is no abdominal tenderness.  Musculoskeletal:        General: Normal range of motion.     Cervical back: Normal range of motion and neck supple.     Left lower leg: Edema (4+ pitting edema throughout LLE) present.  Skin:    General: Skin is warm and dry.  Neurological:     General: No focal deficit present.     Mental Status: She is alert.  Psychiatric:        Attention and Perception: She is inattentive.        Speech: Speech is tangential.        Cognition and Memory: Cognition is impaired. Memory is impaired. She exhibits impaired recent memory.      Consultants:  Palliative  care General Surgery-signed off Urology  Procedures:    Data Reviewed: Results for orders placed or performed during the hospital encounter of 08/03/24 (from the past 24 hours)  Glucose, capillary     Status: Abnormal   Collection Time: 08/18/24  4:13 PM  Result Value Ref Range   Glucose-Capillary 185 (H) 70 - 99 mg/dL  Glucose, capillary     Status: Abnormal   Collection Time: 08/18/24  5:04 PM  Result Value Ref Range   Glucose-Capillary 160 (H) 70 - 99 mg/dL  Glucose, capillary     Status: Abnormal   Collection Time: 08/18/24  9:01 PM  Result Value Ref Range   Glucose-Capillary 126 (H) 70 - 99 mg/dL  CBC with Differential/Platelet     Status: Abnormal   Collection Time: 08/19/24  3:28 AM  Result Value Ref Range   WBC 8.0 4.0 - 10.5 K/uL   RBC 2.54 (L) 3.87 - 5.11 MIL/uL   Hemoglobin 7.0 (L) 12.0 - 15.0 g/dL   HCT 76.3 (L) 63.9 - 53.9 %   MCV 92.9 80.0 - 100.0 fL   MCH 27.6 26.0 - 34.0 pg   MCHC 29.7 (L) 30.0 - 36.0 g/dL   RDW 82.3 (H) 88.4 - 84.4 %   Platelets 311 150 - 400 K/uL   nRBC 0.0 0.0 - 0.2 %   Neutrophils Relative % 79 %   Neutro  Abs 6.4 1.7 - 7.7 K/uL   Lymphocytes Relative 5 %   Lymphs Abs 0.4 (L) 0.7 - 4.0 K/uL   Monocytes Relative 11 %   Monocytes Absolute 0.9 0.1 - 1.0 K/uL   Eosinophils Relative 1 %   Eosinophils Absolute 0.1 0.0 - 0.5 K/uL   Basophils Relative 1 %   Basophils Absolute 0.0 0.0 - 0.1 K/uL   Immature Granulocytes 3 %   Abs Immature Granulocytes 0.21 (H) 0.00 - 0.07 K/uL  Basic metabolic panel with GFR     Status: Abnormal   Collection Time: 08/19/24  3:28 AM  Result Value Ref Range   Sodium 139 135 - 145 mmol/L   Potassium 5.0 3.5 - 5.1 mmol/L   Chloride 107 98 - 111 mmol/L   CO2 25 22 - 32 mmol/L   Glucose, Bld 147 (H) 70 - 99 mg/dL   BUN 26 (H) 8 - 23 mg/dL   Creatinine, Ser 8.53 (H) 0.44 - 1.00 mg/dL   Calcium  8.6 (L) 8.9 - 10.3 mg/dL   GFR, Estimated 35 (L) >60 mL/min   Anion gap 7 5 - 15  Glucose, capillary     Status: Abnormal   Collection Time: 08/19/24  7:41 AM  Result Value Ref Range   Glucose-Capillary 139 (H) 70 - 99 mg/dL  Glucose, capillary     Status: Abnormal   Collection Time: 08/19/24 11:43 AM  Result Value Ref Range   Glucose-Capillary 181 (H) 70 - 99 mg/dL    I have reviewed pertinent nursing notes, vitals, labs, and images as necessary. I have ordered labwork to follow up on as indicated.  I have reviewed the last notes from staff over past 24 hours. I have discussed patient's care plan and test results with nursing staff, CM/SW, and other staff as appropriate.  Old records reviewed in assessment of this patient  Time spent: Greater than 50% of the 55 minute visit was spent in counseling/coordination of care for the patient as laid out in the A&P.   LOS: 16 days   Alm Apo, MD [facility]  08/19/2024, 3:04 PM "

## 2024-08-19 NOTE — Progress Notes (Signed)
 Mobility Specialist Progress Note:   08/19/24 1104  Mobility  Activity Pivoted/transferred from chair to bed  Level of Assistance Moderate assist, patient does 50-74%  Assistive Device  (HHA)  Distance Ambulated (ft) 2 ft  Activity Response Tolerated fair  Mobility Referral Yes  Mobility visit 1 Mobility  Mobility Specialist Start Time (ACUTE ONLY) 1054  Mobility Specialist Stop Time (ACUTE ONLY) 1103  Mobility Specialist Time Calculation (min) (ACUTE ONLY) 9 min   Pt was received in recliner and requested to get back in bed. Mod A sit to stand. Returned to bed with all needs met. Call bell in reach. Left in room with family.  [employer] - Mobility Specialist

## 2024-08-19 NOTE — Plan of Care (Signed)

## 2024-08-19 NOTE — Plan of Care (Signed)
  Problem: Education: Goal: Knowledge of General Education information will improve Description: Including pain rating scale, medication(s)/side effects and non-pharmacologic comfort measures Outcome: Progressing   Problem: Health Behavior/Discharge Planning: Goal: Ability to manage health-related needs will improve Outcome: Progressing   Problem: Clinical Measurements: Goal: Ability to maintain clinical measurements within normal limits will improve Outcome: Progressing Goal: Will remain free from infection Outcome: Progressing Goal: Diagnostic test results will improve Outcome: Progressing Goal: Respiratory complications will improve Outcome: Progressing Goal: Cardiovascular complication will be avoided Outcome: Progressing   Problem: Activity: Goal: Risk for activity intolerance will decrease Outcome: Progressing   Problem: Nutrition: Goal: Adequate nutrition will be maintained Outcome: Progressing   Problem: Coping: Goal: Level of anxiety will decrease Outcome: Progressing   Problem: Elimination: Goal: Will not experience complications related to bowel motility Outcome: Progressing Goal: Will not experience complications related to urinary retention Outcome: Progressing   Problem: Pain Managment: Goal: General experience of comfort will improve and/or be controlled Outcome: Progressing   Problem: Safety: Goal: Ability to remain free from injury will improve Outcome: Progressing   Problem: Skin Integrity: Goal: Risk for impaired skin integrity will decrease Outcome: Progressing   Problem: Education: Goal: Ability to describe self-care measures that may prevent or decrease complications (Diabetes Survival Skills Education) will improve Outcome: Progressing Goal: Individualized Educational Video(s) Outcome: Progressing   Problem: Coping: Goal: Ability to adjust to condition or change in health will improve Outcome: Progressing   Problem: Fluid  Volume: Goal: Ability to maintain a balanced intake and output will improve Outcome: Progressing   Problem: Metabolic: Goal: Ability to maintain appropriate glucose levels will improve Outcome: Progressing   Problem: Nutritional: Goal: Maintenance of adequate nutrition will improve Outcome: Progressing Goal: Progress toward achieving an optimal weight will improve Outcome: Progressing   Problem: Skin Integrity: Goal: Risk for impaired skin integrity will decrease Outcome: Progressing   Problem: Tissue Perfusion: Goal: Adequacy of tissue perfusion will improve Outcome: Progressing

## 2024-08-19 NOTE — Progress Notes (Signed)
 Mobility Specialist Progress Note:   08/19/24 1046  Mobility  Activity Pivoted/transferred to/from BSC  Level of Assistance Moderate assist, patient does 50-74%  Assistive Device Helmet (HHA)  Distance Ambulated (ft) 2 ft  Activity Response Tolerated fair  Mobility Referral Yes  Mobility visit 1 Mobility  Mobility Specialist Start Time (ACUTE ONLY) 1003  Mobility Specialist Stop Time (ACUTE ONLY) 1023  Mobility Specialist Time Calculation (min) (ACUTE ONLY) 20 min   Pt was received in bed and requested to use BSC. Mod A sit to stand. Returned to recliner with all needs met, call bell in reach. Left in room with family.  [employer] - Mobility Specialist

## 2024-08-19 NOTE — Telephone Encounter (Signed)
 Copied from CRM [id number]. Topic: General - Other >> Aug 19, 2024  9:48 AM Travis F wrote: Reason for CRM: Patient's daughter Burnard is calling in requesting to speak with Dr. Georgina. Patient is currently at [facility] and has stage 4 cancer and is not doing well. Burnard says they are in the middle of an appeal to get the right care for her mother and she wanted to speak with patient's provider regarding this. Burnard says she is in town now, but will leave soon and wanted to get this straightened out.   Spoke with Burnard to make her aware that [facility] is currently out of office. Burnard states it is okay to call her brother who Ms.Hernandes lives with to discuss since Burnard is in [place]  and leaves tomorrow.

## 2024-08-19 NOTE — TOC Progression Note (Addendum)
 Transition of Care [facility]) - Progression Note    Patient Details  Name: Abigail [name] MRN: [id number] Date of Birth: May 23, 1936  [facility] CM/SW Contact  [name], RN Phone Number: 08/19/2024, 12:37 PM  Clinical Narrative:   NCM called to [employer], spoke with Glade, RN UM, confirmed no determination from [facility] department at this time. NCM provided name and phone number for call with determination.   -TOC consult for Home Hospice. NCM confirmed that if SNF denial is upheld, will speak with patient/family regarding home hospice.     Expected Discharge Plan: Home w Home Health Services Barriers to Discharge: Continued Medical Work up               Expected Discharge Plan and Services       Living arrangements for the past 2 months: Single Family Home                                       Social Drivers of Health (SDOH) Interventions SDOH Screenings   Food Insecurity: No Food Insecurity (08/03/2024)  Housing: Low Risk (08/03/2024)  Transportation Needs: No Transportation Needs (08/03/2024)  Utilities: Not At Risk (08/03/2024)  Alcohol Screen: Low Risk (03/09/2024)  Depression (PHQ2-9): Low Risk (07/01/2024)  Financial Resource Strain: Medium Risk (03/09/2024)  Physical Activity: Inactive (03/09/2024)  Social Connections: Moderately Isolated (08/03/2024)  Stress: No Stress Concern Present (03/09/2024)  Recent Concern: Stress - Stress Concern Present (03/09/2024)  Tobacco Use: Medium Risk (08/05/2024)  Health Literacy: Adequate Health Literacy (03/09/2024)    Readmission Risk Interventions    06/07/2024    3:57 PM  Readmission Risk Prevention Plan  Transportation Screening Complete  PCP or Specialist Appt within 3-5 Days Complete  HRI or Home Care Consult Complete  Social Work Consult for Recovery Care Planning/Counseling Complete  Palliative Care Screening Complete  Medication Review Oceanographer) Referral to Pharmacy

## 2024-08-20 DIAGNOSIS — N1832 Chronic kidney disease, stage 3b: Secondary | ICD-10-CM | POA: Diagnosis not present

## 2024-08-20 DIAGNOSIS — N179 Acute kidney failure, unspecified: Secondary | ICD-10-CM | POA: Diagnosis not present

## 2024-08-20 DIAGNOSIS — Z7189 Other specified counseling: Secondary | ICD-10-CM | POA: Diagnosis not present

## 2024-08-20 LAB — GLUCOSE, CAPILLARY
Glucose-Capillary: 121 mg/dL — ABNORMAL HIGH (ref 70–99)
Glucose-Capillary: 149 mg/dL — ABNORMAL HIGH (ref 70–99)
Glucose-Capillary: 148 mg/dL — ABNORMAL HIGH (ref 70–99)
Glucose-Capillary: 123 mg/dL — ABNORMAL HIGH (ref 70–99)

## 2024-08-20 NOTE — Progress Notes (Signed)
 Mobility Specialist Progress Note:   08/20/24 1026  Mobility  Activity  (Bed Exercises)  Level of Assistance Independent  Range of Motion/Exercises Active;All extremities  Activity Response Tolerated well  Mobility Referral Yes  Mobility visit 1 Mobility  Mobility Specialist Start Time (ACUTE ONLY) 0900  Mobility Specialist Stop Time (ACUTE ONLY) 0916  Mobility Specialist Time Calculation (min) (ACUTE ONLY) 16 min   Pt was received in bed and agreed to bed exercise mobility: Seated BLE Exercises:  1) Toe Raise/ Heel Raise: 1 x 8  2) Ankle Pumps: 1 x 8  3) Hip Adduction (pillow squeezes): 1 x 8  Seated ULE Exercises:   1) Arm Raises 1 x 8  Pt had no complaints/issues during session. Returned to bed with all needs met, call bell in reach.  [employer] - Mobility Specialist

## 2024-08-20 NOTE — Progress Notes (Addendum)
 " Progress Note    ILO [name]   FMW:[id number]  DOB: [birth year]  DOA: 08/03/2024     17 PCP: Abigail Speaks, FNP  Initial CC: Weakness  Hospital Course: [name] is an 88 year old female with PMH metastatic non-small lung cancer with mets to brain, currently on radiation treatment, chronic hypoxic respiratory failure on 3 L O2, CKD stage IIIb, 2 skin cancers on her legs s/p resection, HTN, CAD, COPD, chronic normocytic anemia and hypothyroidism.   Patient was admitted with generalized weakness and severe anemia, hemoglobin of 6.7 g/dL.  Patient has been transfused 2 units of packed red blood cells.  Weakness is thought to be multifactorial.   Workup revealed Pseudomonas UTI and sigmoid diverticulitis with presumed abscess concerning for focal/contained perforation.  General surgery also consulted on admission. Imaging studies also showed severe left hydronephrosis and hydroureter with ureter dilated to the level of the pelvis/area of inflammatory change at the sigmoid colon.  Creatinine also elevated at 2.2 on admission due to concern for bladder outlet obstruction. Lower extremity duplex also showed age-indeterminate DVT involving RLE and acute DVT involving LLE.   Assessment & Plan:   AKI on CKD stage IIIb BOO/Left hydronephrosis - Baseline creatinine around 1.6 - Admitted with serum creatinine of 2.2 -Poor oral intake prior to presentation - Renal ultrasound shows severe left hydronephrosis; also noted on CT was hydroureter with ureter dilated to the level of the pelvis/area of inflammatory change at the sigmoid - Urology following, appreciate assistance.  Tentative plan for potential stent placement if creatinine declines or fails to improve - Creatinine improved on 12/17, therefore urology holding off on stent placement at this time as showing improvement - Renal ultrasound repeated on 08/11/2024.  Moderate to severe but stable left hydronephrosis; renal function continues to  improve as well - Discussed with urology.  TOV started 12/19; so far voiding well without foley - trend BMP post foley removal  -Eventual outpatient follow-up with urology for repeat imaging again   Diverticulitis with abscess - CT on 08/04/2024 shows diffuse diverticular disease of the colon with presumed sigmoid diverticulitis and gas fluid collection involving left pelvic sidewall measuring 5.9 cm concerning for focal contained perforation or abscess - General Surgery following, no recommendations for intervention at this time.  Fluid collection is near iliac bone and not amenable for drainage - 2 weeks antibiotics recommended per surgery - transition abx to Augmentin  to complete course; completed 12/24 - Diet has been advanced  Bilateral LE DVT - Significant edema noted in LLE and duplex shows acute DVT involving left common femoral vein, SF junction, femoral vein, proximal profunda vein, popliteal vein, posterior tibial vein, peroneal vein, external iliac vein - Age-indeterminate RLE DVT involving right popliteal, posterior tibial, peroneal veins - resume Lovenox  on 12/17 as no plans for stent/procedures  - transition to Eliquis  on 12/19 for ease of administration; poor candidate for Lovenox  at home due to cognitive issues  LLE SVT - Duplex also noted superficial venous thrombosis involving left great saphenous vein and acute intramuscular thrombosis involving left gastrocnemius veins and left soleal veins  Metastatic non-small cell lung cancer with solitary cerebral brain metastasis -Follows with Dr. Sherrod and rad onc.  Completed concurrent chemoradiation and 1 year of Durvalumab  consolidation therapy.   - Currently on stereotactic radiosurgery for brain mets.  She completed the course of Decadron .   - Plan of care also following, appreciate assistance.  Patient has difficulty partaking with GOC discussions and adequate comprehension/concentration centered around  discussions -Remaining full code and full scope for now but needs outpatient palliative care and/or hospice to continue following at discharge  Physical deconditioning -Patient lives with son but in need of SNF; ultimately would benefit from LTC especially with memory problems lately  - will ask for PT/OT to remain on team given no longer pursuing comfort care - denied for SNF; denied by [name] as well; likely will need max HH arranged and still will need hospice to follow at home on discharge as well - appeal sent for SNF denial in efforts to help patient get to rehab at least short term given multiple acute issues as noted above; she is not at baseline as she was at home with [facility] previously and has potential for rehabing    Chronic hypoxic respiratory failure/COPD -Currently not on exacerbation.  On 3 L of oxygen  chronically at home.  Continue bronchodilators, inhalers  Acute on chronic normocytic anemia -Baseline hemoglobin around 7 - 8 g/dL - Admitted with hemoglobin of 6.8 g/dL.  Likely associated with malignancy.  No evidence of acute blood loss.   - s/p 2 units PRBC since admission  -Continue ferrous sulfate .   Hypertension - Blood pressure is controlled - Takes metoprolol  at home.  Currently on hold   CAD Elevated troponin  -On aspirin , Lipitor , Repatha .  No anginal symptoms. Takes aspirin  at home, currently on hold, will resume on discharge if no evidence of GI bleed. Denies any chest pain today.  Elevated troponin is likely from demand ischemia from anemia.   History of skin cancer -Underwent resection of the skin tumor on the left leg on October of this year.  Wound care consulted.  Wound appears to be slightly infected with purulent material.  Patient is on IV meropenem    History of recurrent UTI -On chronic therapy with nitrofurantoin . She has  noted frequency, dysuria symptoms.  Also complained of left lower quadrant pain.  Checking CT abdomen/pelvis without contrast.   Started on ceftriaxone .  Urine culture showing 30,000 colonies of Pseudomonas along with mixed organisms. - s/p meropenem  initially   Interval History:  No events overnight.  Resting comfortably in bed this morning.  No issues. Awaiting appeal process.   Antimicrobials: Rocephin  08/03/2024 >> 08/05/2024 Flagyl  08/04/2024 >> 08/08/2024 Meropenem  08/06/2024 >> 08/10/2024 Augmentin  08/10/2024 >> 12/24  DVT prophylaxis:  SCDs Start: 08/03/24 1141 apixaban  (ELIQUIS ) tablet 5 mg   Code Status:   Code Status: Full Code  Mobility Assessment (Last 72 Hours)     Mobility Assessment     Row Name 08/20/24 9182 08/19/24 2150 08/19/24 1039 08/18/24 2200 08/18/24 1653   Does the patient have exclusion criteria? No- Perform mobility assessment No- Perform mobility assessment No- Perform mobility assessment No- Perform mobility assessment No- Perform mobility assessment   What is the highest level of mobility based on the mobility assessment? Level 4 (Ambulates with assistance) - Balance while stepping forward/back - Complete Level 4 (Ambulates with assistance) - Balance while stepping forward/back - Complete Level 4 (Ambulates with assistance) - Balance while stepping forward/back - Complete Level 5 (Ambulates independently) - Balance while walking independently - Complete Level 4 (Ambulates with assistance) - Balance while stepping forward/back - Complete   Is the above level different from baseline mobility prior to current illness? Yes - Recommend PT order Yes - Recommend PT order Yes - Recommend PT order Yes - Recommend PT order Yes - Recommend PT order    Row Name 08/18/24 0941 08/17/24 2034 08/17/24 1406  Does the patient have exclusion criteria? No- Perform mobility assessment No- Perform mobility assessment --     What is the highest level of mobility based on the mobility assessment? Level 4 (Ambulates with assistance) - Balance while stepping forward/back - Complete Level 4 (Ambulates  with assistance) - Balance while stepping forward/back - Complete Level 4 (Ambulates with assistance) - Balance while stepping forward/back - Complete     Is the above level different from baseline mobility prior to current illness? Yes - Recommend PT order Yes - Recommend PT order --        Diet: Diet Orders (From admission, onward)     Start     Ordered   08/12/24 0732  Diet regular Room service appropriate? Yes; Fluid consistency: Thin  Diet effective now       Question Answer Comment  Room service appropriate? Yes   Fluid consistency: Thin      08/12/24 0731            Barriers to discharge: Patient changing mind regarding GOC Disposition Plan: Home with home health and hospice HH orders placed: TBD Status is: Inpatient  Objective: Blood pressure (!) 117/55, pulse 75, temperature 98.6 F (37 C), temperature source Oral, resp. rate 18, height 5' 6 (1.676 m), weight 71.5 kg, SpO2 94%.  Examination:  Physical Exam Constitutional:      Appearance: Normal appearance.  HENT:     Head: Normocephalic and atraumatic.     Mouth/Throat:     Mouth: Mucous membranes are moist.  Eyes:     Extraocular Movements: Extraocular movements intact.  Cardiovascular:     Rate and Rhythm: Normal rate and regular rhythm.  Pulmonary:     Effort: Pulmonary effort is normal. No respiratory distress.     Breath sounds: Normal breath sounds. No wheezing.  Abdominal:     General: Bowel sounds are normal. There is no distension.     Palpations: Abdomen is soft.     Tenderness: There is no abdominal tenderness.  Musculoskeletal:        General: Normal range of motion.     Cervical back: Normal range of motion and neck supple.     Right lower leg: Edema (chronic 1-2+) present.     Left lower leg: Edema (4+ pitting edema throughout LLE) present.  Skin:    General: Skin is warm and dry.  Neurological:     General: No focal deficit present.     Mental Status: She is alert.  Psychiatric:         Attention and Perception: She is inattentive.        Speech: Speech is tangential.        Cognition and Memory: Cognition is impaired. Memory is impaired. She exhibits impaired recent memory.      Consultants:  Palliative care General Surgery-signed off Urology  Procedures:    Data Reviewed: Results for orders placed or performed during the hospital encounter of 08/03/24 (from the past 24 hours)  Glucose, capillary     Status: Abnormal   Collection Time: 08/19/24  4:50 PM  Result Value Ref Range   Glucose-Capillary 133 (H) 70 - 99 mg/dL  Glucose, capillary     Status: Abnormal   Collection Time: 08/19/24  9:40 PM  Result Value Ref Range   Glucose-Capillary 157 (H) 70 - 99 mg/dL  Glucose, capillary     Status: Abnormal   Collection Time: 08/20/24  7:50 AM  Result Value Ref Range  Glucose-Capillary 121 (H) 70 - 99 mg/dL  Glucose, capillary     Status: Abnormal   Collection Time: 08/20/24 12:20 PM  Result Value Ref Range   Glucose-Capillary 149 (H) 70 - 99 mg/dL    I have reviewed pertinent nursing notes, vitals, labs, and images as necessary. I have ordered labwork to follow up on as indicated.  I have reviewed the last notes from staff over past 24 hours. I have discussed patient's care plan and test results with nursing staff, CM/SW, and other staff as appropriate.  Old records reviewed in assessment of this patient    LOS: 17 days   Alm Apo, MD [facility] 08/20/2024, 1:57 PM "

## 2024-08-20 NOTE — Plan of Care (Signed)
" °  Problem: Education: Goal: Knowledge of General Education information will improve Description: Including pain rating scale, medication(s)/side effects and non-pharmacologic comfort measures Outcome: Progressing   Problem: Health Behavior/Discharge Planning: Goal: Ability to manage health-related needs will improve Outcome: Progressing   Problem: Clinical Measurements: Goal: Ability to maintain clinical measurements within normal limits will improve Outcome: Progressing Goal: Will remain free from infection Outcome: Progressing Goal: Diagnostic test results will improve Outcome: Progressing Goal: Respiratory complications will improve Outcome: Progressing Goal: Cardiovascular complication will be avoided Outcome: Progressing   Problem: Activity: Goal: Risk for activity intolerance will decrease Outcome: Progressing   Problem: Nutrition: Goal: Adequate nutrition will be maintained Outcome: Adequate for Discharge   Problem: Coping: Goal: Level of anxiety will decrease Outcome: Progressing   Problem: Elimination: Goal: Will not experience complications related to bowel motility Outcome: Completed/Met Goal: Will not experience complications related to urinary retention Outcome: Completed/Met   Problem: Pain Managment: Goal: General experience of comfort will improve and/or be controlled Outcome: Progressing   Problem: Safety: Goal: Ability to remain free from injury will improve Outcome: Progressing   Problem: Skin Integrity: Goal: Risk for impaired skin integrity will decrease Outcome: Progressing   Problem: Fluid Volume: Goal: Ability to maintain a balanced intake and output will improve Outcome: Adequate for Discharge   Problem: Health Behavior/Discharge Planning: Goal: Ability to manage health-related needs will improve Outcome: Progressing   Problem: Metabolic: Goal: Ability to maintain appropriate glucose levels will improve Outcome: Adequate for  Discharge   Problem: Nutritional: Goal: Maintenance of adequate nutrition will improve Outcome: Adequate for Discharge Goal: Progress toward achieving an optimal weight will improve Outcome: Progressing   Problem: Skin Integrity: Goal: Risk for impaired skin integrity will decrease Outcome: Progressing   Problem: Tissue Perfusion: Goal: Adequacy of tissue perfusion will improve Outcome: Adequate for Discharge   "

## 2024-08-21 ENCOUNTER — Inpatient Hospital Stay (HOSPITAL_COMMUNITY)

## 2024-08-21 DIAGNOSIS — L8990 Pressure ulcer of unspecified site, unspecified stage: Secondary | ICD-10-CM | POA: Insufficient documentation

## 2024-08-21 DIAGNOSIS — J90 Pleural effusion, not elsewhere classified: Secondary | ICD-10-CM | POA: Diagnosis not present

## 2024-08-21 DIAGNOSIS — R509 Fever, unspecified: Secondary | ICD-10-CM | POA: Diagnosis not present

## 2024-08-21 LAB — CBC WITH DIFFERENTIAL/PLATELET
Abs Immature Granulocytes: 0.16 K/uL — ABNORMAL HIGH (ref 0.00–0.07)
Basophils Absolute: 0 K/uL (ref 0.0–0.1)
Basophils Relative: 1 %
Eosinophils Absolute: 0.1 K/uL (ref 0.0–0.5)
Eosinophils Relative: 1 %
HCT: 24.5 % — ABNORMAL LOW (ref 36.0–46.0)
Hemoglobin: 7.3 g/dL — ABNORMAL LOW (ref 12.0–15.0)
Immature Granulocytes: 2 %
Lymphocytes Relative: 4 %
Lymphs Abs: 0.3 K/uL — ABNORMAL LOW (ref 0.7–4.0)
MCH: 27.8 pg (ref 26.0–34.0)
MCHC: 29.8 g/dL — ABNORMAL LOW (ref 30.0–36.0)
MCV: 93.2 fL (ref 80.0–100.0)
Monocytes Absolute: 1.3 K/uL — ABNORMAL HIGH (ref 0.1–1.0)
Monocytes Relative: 16 %
Neutro Abs: 6.4 K/uL (ref 1.7–7.7)
Neutrophils Relative %: 76 %
Platelets: 308 K/uL (ref 150–400)
RBC: 2.63 MIL/uL — ABNORMAL LOW (ref 3.87–5.11)
RDW: 17.2 % — ABNORMAL HIGH (ref 11.5–15.5)
WBC: 8.3 K/uL (ref 4.0–10.5)
nRBC: 0 % (ref 0.0–0.2)

## 2024-08-21 LAB — BASIC METABOLIC PANEL WITH GFR
Anion gap: 10 (ref 5–15)
BUN: 24 mg/dL — ABNORMAL HIGH (ref 8–23)
CO2: 23 mmol/L (ref 22–32)
Calcium: 8.5 mg/dL — ABNORMAL LOW (ref 8.9–10.3)
Chloride: 102 mmol/L (ref 98–111)
Creatinine, Ser: 1.56 mg/dL — ABNORMAL HIGH (ref 0.44–1.00)
GFR, Estimated: 32 mL/min — ABNORMAL LOW
Glucose, Bld: 126 mg/dL — ABNORMAL HIGH (ref 70–99)
Potassium: 4.8 mmol/L (ref 3.5–5.1)
Sodium: 135 mmol/L (ref 135–145)

## 2024-08-21 LAB — GLUCOSE, CAPILLARY
Glucose-Capillary: 116 mg/dL — ABNORMAL HIGH (ref 70–99)
Glucose-Capillary: 135 mg/dL — ABNORMAL HIGH (ref 70–99)
Glucose-Capillary: 104 mg/dL — ABNORMAL HIGH (ref 70–99)

## 2024-08-21 LAB — RESP PANEL BY RT-PCR (RSV, FLU A&B, COVID)  RVPGX2
Influenza A by PCR: NEGATIVE
Influenza B by PCR: NEGATIVE
Resp Syncytial Virus by PCR: NEGATIVE
SARS Coronavirus 2 by RT PCR: NEGATIVE

## 2024-08-21 NOTE — Progress Notes (Addendum)
 " Progress Note    IRINA OKELLY   FMW:[id number]  DOB: 1936-01-20  DOA: 08/03/2024     18 PCP: [name], FNP  Initial CC: Weakness  Hospital Course: Ms. Deschepper is an 88 year old female with PMH metastatic non-small lung cancer with mets to brain, currently on radiation treatment, chronic hypoxic respiratory failure on 3 L O2, CKD stage IIIb, 2 skin cancers on her legs s/p resection, HTN, CAD, COPD, chronic normocytic anemia and hypothyroidism.   Patient was admitted with generalized weakness and severe anemia, hemoglobin of 6.7 g/dL.  Patient has been transfused 2 units of packed red blood cells.  Weakness is thought to be multifactorial.   Workup revealed Pseudomonas UTI and sigmoid diverticulitis with presumed abscess concerning for focal/contained perforation.  General surgery also consulted on admission. Imaging studies also showed severe left hydronephrosis and hydroureter with ureter dilated to the level of the pelvis/area of inflammatory change at the sigmoid colon.  Creatinine also elevated at 2.2 on admission due to concern for bladder outlet obstruction. Lower extremity duplex also showed age-indeterminate DVT involving RLE and acute DVT involving LLE.   Assessment & Plan:   AKI on CKD stage IIIb BOO/Left hydronephrosis - Baseline creatinine around 1.6 - Admitted with serum creatinine of 2.2 -Poor oral intake prior to presentation - Renal ultrasound shows severe left hydronephrosis; also noted on CT was hydroureter with ureter dilated to the level of the pelvis/area of inflammatory change at the sigmoid - Urology following, appreciate assistance.  Consideration was given to potential stent placement if creatinine failed to improve - Creatinine improved on 12/17, therefore urology holding off on stent placement at this time as showing improvement - Renal ultrasound repeated on 08/11/2024.  Moderate to severe but stable left hydronephrosis; renal function continues to improve  as well - Discussed with urology.  TOV started 12/19; so far voiding well without foley - Creatinine has remained stable post discontinuation of Foley -Eventual outpatient follow-up with urology for repeat imaging again   Diverticulitis with abscess - CT on 08/04/2024 shows diffuse diverticular disease of the colon with presumed sigmoid diverticulitis and gas fluid collection involving left pelvic sidewall measuring 5.9 cm concerning for focal contained perforation or abscess - General Surgery following, no recommendations for intervention at this time.  Fluid collection is near iliac bone and not amenable for drainage - 2 weeks antibiotics recommended per surgery - transition abx to Augmentin  to complete course; completed 12/24 - Diet has been advanced and patient tolerating without abdominal pain - Tmax 101.2 overnight into Sunday without associated change or increase in abdominal pain  Fever -Patient had Tmax overnight of 101.2 F -No abdominal pain on exam and appears to otherwise be responding to antibiotics for UTI -PCR for COVID and flu/RSV negative.  Patient denies worsening shortness of breath but has been limited in current physical activity level -Chest x-ray today concerning for enlarging pleural effusion-CT of the chest ordered but has not resulted  Bilateral LE DVT - Significant edema noted in LLE and duplex shows acute DVT involving left common femoral vein, SF junction, femoral vein, proximal profunda vein, popliteal vein, posterior tibial vein, peroneal vein, external iliac vein - Age-indeterminate RLE DVT involving right popliteal, posterior tibial, peroneal veins - resume Lovenox  on 12/17 as no plans for stent/procedures  - transitioned to Eliquis  on 12/19 for ease of administration; poor candidate for Lovenox  at home due to cognitive issues  LLE SVT - Duplex also noted superficial venous thrombosis involving left great saphenous  vein and acute intramuscular thrombosis  involving left gastrocnemius veins and left soleal veins  Metastatic non-small cell lung cancer with solitary cerebral brain metastasis -Follows with Dr. Sherrod and rad onc.  Completed concurrent chemoradiation and 1 year of Durvalumab  consolidation therapy.   - Currently on stereotactic radiosurgery for brain mets.  She completed the course of Decadron .   -Chest x-ray today demonstrated what appears to be a new pleural effusion.  Uncertain if this is infectious or malignant given underlying lung cancer.  CT of the chest without contrast has been ordered. - Plan of care also following, appreciate assistance.  Patient has difficulty partaking with GOC discussions and adequate comprehension/concentration centered around discussions -Remaining full code and full scope for now but needs outpatient palliative care and/or hospice to continue following at discharge  Physical deconditioning -Patient lives with son but in need of SNF; ultimately would benefit from LTC especially with memory problems lately  - will ask for PT/OT to remain on team given no longer pursuing comfort care - denied for SNF; denied by [name] as well; likely will need max HH arranged and still will need hospice to follow at home on discharge as well - appeal sent for SNF denial in efforts to help patient get to rehab at least short term given multiple acute issues as noted above; she is not at baseline as she was at home with [facility] previously and has potential for rehabing    Chronic hypoxic respiratory failure/COPD -Currently not on exacerbation.  On 3 L of oxygen  chronically at home.  Continue bronchodilators, inhalers  Acute on chronic normocytic anemia -Baseline hemoglobin around 7 - 8 g/dL - Admitted with hemoglobin of 6.8 g/dL.  Likely associated with malignancy.  No evidence of acute blood loss.   - s/p 2 units PRBC since admission  -Continue ferrous sulfate .   Hypertension - Blood pressure is controlled - Takes metoprolol   at home.  Currently on hold   CAD Elevated troponin  -On aspirin , Lipitor , Repatha .  No anginal symptoms. Takes aspirin  at home, currently on hold, will resume on discharge if no evidence of GI bleed. -Denied chest pain therefore elevated troponin is likely from demand ischemia and from anemia.   History of skin cancer -Underwent resection of the skin tumor on the left leg on October of this year.  Wound care consulted.  Wound appears to be slightly infected with purulent material.  Patient is on IV meropenem    History of recurrent UTI -On chronic therapy with nitrofurantoin . She has noted frequency, dysuria symptoms.  Also complained of left lower quadrant pain.  Checking CT abdomen/pelvis without contrast.  Started on ceftriaxone .  Urine culture showing 30,000 colonies of Pseudomonas along with mixed organisms. - s/p meropenem  initially   Interval History:  No events overnight.  Resting comfortably in bed this morning.  No issues. Awaiting appeal process.   Antimicrobials: Rocephin  08/03/2024 >> 08/05/2024 Flagyl  08/04/2024 >> 08/08/2024 Meropenem  08/06/2024 >> 08/10/2024 Augmentin  08/10/2024 >> 12/24  DVT prophylaxis:  SCDs Start: 08/03/24 1141 apixaban  (ELIQUIS ) tablet 5 mg   Code Status:   Code Status: Full Code  Mobility Assessment (Last 72 Hours)     Mobility Assessment     Row Name 08/21/24 0000 08/20/24 0817 08/19/24 2150 08/19/24 1039 08/18/24 2200   Does the patient have exclusion criteria? No- Perform mobility assessment No- Perform mobility assessment No- Perform mobility assessment No- Perform mobility assessment No- Perform mobility assessment   What is the highest level of mobility based  on the mobility assessment? Level 4 (Ambulates with assistance) - Balance while stepping forward/back - Complete Level 4 (Ambulates with assistance) - Balance while stepping forward/back - Complete Level 4 (Ambulates with assistance) - Balance while stepping forward/back -  Complete Level 4 (Ambulates with assistance) - Balance while stepping forward/back - Complete Level 5 (Ambulates independently) - Balance while walking independently - Complete   Is the above level different from baseline mobility prior to current illness? Yes - Recommend PT order Yes - Recommend PT order Yes - Recommend PT order Yes - Recommend PT order Yes - Recommend PT order    Row Name 08/18/24 1653 08/18/24 0941         Does the patient have exclusion criteria? No- Perform mobility assessment No- Perform mobility assessment      What is the highest level of mobility based on the mobility assessment? Level 4 (Ambulates with assistance) - Balance while stepping forward/back - Complete Level 4 (Ambulates with assistance) - Balance while stepping forward/back - Complete      Is the above level different from baseline mobility prior to current illness? Yes - Recommend PT order Yes - Recommend PT order         Diet: Diet Orders (From admission, onward)     Start     Ordered   08/12/24 0732  Diet regular Room service appropriate? Yes; Fluid consistency: Thin  Diet effective now       Question Answer Comment  Room service appropriate? Yes   Fluid consistency: Thin      08/12/24 0731            Barriers to discharge: Patient changing mind regarding GOC Disposition Plan: Home with home health and hospice HH orders placed: TBD Status is: Inpatient  Objective: Blood pressure (!) 124/54, pulse 82, temperature 99 F (37.2 C), temperature source Oral, resp. rate 18, height 5' 6 (1.676 m), weight 71.5 kg, SpO2 91%.  Examination:  Constitutional: NAD, calm, comfortable Respiratory: Coarse bilateral lung sounds with crackles throughout.  Di bilateral lower extremity edema left markedly so with 3+ right with this.  Minished on the left to the base.  Normal respiratory effort. No accessory muscle use.  3 L Cardiovascular: Regular rate and rhythm, no murmurs / rubs / gallops. Abdomen: no  tenderness, no masses palpated. No hepatosplenomegaly. Bowel sounds positive.  Musculoskeletal: no clubbing / cyanosis. No joint deformity upper and lower extremities. Good ROM, no contractures. Normal muscle tone.  Skin: no rashes, lesions, venous stasis ulcer left lower extremity anterior surface covered with dressing.  No induration Neurologic: CN 2-12 grossly intact. Sensation intact, Strength 4/5 x all 4 extremities.  Able to roll self in bed with minimal assistance.  Does report vertigo-like symptoms when repositioning head rapidly to the right Psychiatric: Alert and oriented x name and partially to place. Normal mood.     Consultants:  Palliative care General Surgery-signed off Urology  Procedures:    Data Reviewed: Results for orders placed or performed during the hospital encounter of 08/03/24 (from the past 24 hours)  Glucose, capillary     Status: Abnormal   Collection Time: 08/20/24  7:50 AM  Result Value Ref Range   Glucose-Capillary 121 (H) 70 - 99 mg/dL  Glucose, capillary     Status: Abnormal   Collection Time: 08/20/24 12:20 PM  Result Value Ref Range   Glucose-Capillary 149 (H) 70 - 99 mg/dL  Glucose, capillary     Status: Abnormal   Collection Time:  08/20/24  5:03 PM  Result Value Ref Range   Glucose-Capillary 148 (H) 70 - 99 mg/dL  Glucose, capillary     Status: Abnormal   Collection Time: 08/20/24  9:29 PM  Result Value Ref Range   Glucose-Capillary 123 (H) 70 - 99 mg/dL  CBC with Differential/Platelet     Status: Abnormal   Collection Time: 08/21/24  3:41 AM  Result Value Ref Range   WBC 8.3 4.0 - 10.5 K/uL   RBC 2.63 (L) 3.87 - 5.11 MIL/uL   Hemoglobin 7.3 (L) 12.0 - 15.0 g/dL   HCT 75.4 (L) 63.9 - 53.9 %   MCV 93.2 80.0 - 100.0 fL   MCH 27.8 26.0 - 34.0 pg   MCHC 29.8 (L) 30.0 - 36.0 g/dL   RDW 82.7 (H) 88.4 - 84.4 %   Platelets 308 150 - 400 K/uL   nRBC 0.0 0.0 - 0.2 %   Neutrophils Relative % 76 %   Neutro Abs 6.4 1.7 - 7.7 K/uL   Lymphocytes  Relative 4 %   Lymphs Abs 0.3 (L) 0.7 - 4.0 K/uL   Monocytes Relative 16 %   Monocytes Absolute 1.3 (H) 0.1 - 1.0 K/uL   Eosinophils Relative 1 %   Eosinophils Absolute 0.1 0.0 - 0.5 K/uL   Basophils Relative 1 %   Basophils Absolute 0.0 0.0 - 0.1 K/uL   Immature Granulocytes 2 %   Abs Immature Granulocytes 0.16 (H) 0.00 - 0.07 K/uL  Basic metabolic panel with GFR     Status: Abnormal   Collection Time: 08/21/24  3:41 AM  Result Value Ref Range   Sodium 135 135 - 145 mmol/L   Potassium 4.8 3.5 - 5.1 mmol/L   Chloride 102 98 - 111 mmol/L   CO2 23 22 - 32 mmol/L   Glucose, Bld 126 (H) 70 - 99 mg/dL   BUN 24 (H) 8 - 23 mg/dL   Creatinine, Ser 8.43 (H) 0.44 - 1.00 mg/dL   Calcium  8.5 (L) 8.9 - 10.3 mg/dL   GFR, Estimated 32 (L) >60 mL/min   Anion gap 10 5 - 15    I have reviewed pertinent nursing notes, vitals, labs, and images as necessary. I have ordered labwork to follow up on as indicated.  I have reviewed the last notes from staff over past 24 hours. I have discussed patient's care plan and test results with nursing staff, CM/SW, and other staff as appropriate.  Old records reviewed in assessment of this patient    LOS: 18 days   Isaiah Lever, ANP [facility] 08/21/2024, 7:31 AM "

## 2024-08-21 NOTE — Progress Notes (Signed)
 Patient seems more forgetful at night- multiple attempts to get OOB unassisted, bed alarm put on sensitive setting and patient reoriented to call bell procedure.

## 2024-08-21 NOTE — Plan of Care (Signed)
 ?  Problem: Coping: ?Goal: Level of anxiety will decrease ?Outcome: Progressing ?  ?Problem: Safety: ?Goal: Ability to remain free from injury will improve ?Outcome: Progressing ?  ?

## 2024-08-22 DIAGNOSIS — N134 Hydroureter: Secondary | ICD-10-CM

## 2024-08-22 DIAGNOSIS — M6281 Muscle weakness (generalized): Secondary | ICD-10-CM

## 2024-08-22 DIAGNOSIS — R531 Weakness: Principal | ICD-10-CM

## 2024-08-22 DIAGNOSIS — N390 Urinary tract infection, site not specified: Secondary | ICD-10-CM | POA: Diagnosis not present

## 2024-08-22 DIAGNOSIS — C7931 Secondary malignant neoplasm of brain: Secondary | ICD-10-CM

## 2024-08-22 DIAGNOSIS — J90 Pleural effusion, not elsewhere classified: Secondary | ICD-10-CM | POA: Diagnosis not present

## 2024-08-22 DIAGNOSIS — N1330 Unspecified hydronephrosis: Secondary | ICD-10-CM | POA: Diagnosis not present

## 2024-08-22 DIAGNOSIS — N179 Acute kidney failure, unspecified: Secondary | ICD-10-CM

## 2024-08-22 DIAGNOSIS — J9611 Chronic respiratory failure with hypoxia: Secondary | ICD-10-CM | POA: Diagnosis not present

## 2024-08-22 DIAGNOSIS — J449 Chronic obstructive pulmonary disease, unspecified: Secondary | ICD-10-CM | POA: Diagnosis not present

## 2024-08-22 DIAGNOSIS — C3490 Malignant neoplasm of unspecified part of unspecified bronchus or lung: Secondary | ICD-10-CM | POA: Diagnosis not present

## 2024-08-22 DIAGNOSIS — B965 Pseudomonas (aeruginosa) (mallei) (pseudomallei) as the cause of diseases classified elsewhere: Secondary | ICD-10-CM | POA: Diagnosis not present

## 2024-08-22 DIAGNOSIS — N1832 Chronic kidney disease, stage 3b: Secondary | ICD-10-CM | POA: Diagnosis not present

## 2024-08-22 LAB — MRSA NEXT GEN BY PCR, NASAL: MRSA by PCR Next Gen: NOT DETECTED

## 2024-08-22 LAB — GLUCOSE, CAPILLARY
Glucose-Capillary: 102 mg/dL — ABNORMAL HIGH (ref 70–99)
Glucose-Capillary: 177 mg/dL — ABNORMAL HIGH (ref 70–99)
Glucose-Capillary: 140 mg/dL — ABNORMAL HIGH (ref 70–99)
Glucose-Capillary: 153 mg/dL — ABNORMAL HIGH (ref 70–99)

## 2024-08-22 LAB — PROCALCITONIN: Procalcitonin: 0.93 ng/mL

## 2024-08-22 NOTE — Plan of Care (Signed)
   Problem: Coping: Goal: Level of anxiety will decrease Outcome: Progressing   Problem: Pain Managment: Goal: General experience of comfort will improve and/or be controlled Outcome: Progressing   Problem: Safety: Goal: Ability to remain free from injury will improve Outcome: Progressing

## 2024-08-22 NOTE — Progress Notes (Signed)
 " Progress Note    Abigail [name]   FMW:[id number]  DOB: [birth year]  DOA: 08/03/2024     19 PCP: [name], FNP  Initial CC: Weakness  Hospital Course: Abigail [name] is an 88 year old female with PMH metastatic non-small lung cancer with mets to brain, currently on radiation treatment, chronic hypoxic respiratory failure on 3 L O2, CKD stage IIIb, 2 skin cancers on her legs s/p resection, HTN, CAD, COPD, chronic normocytic anemia and hypothyroidism.   Patient was admitted with generalized weakness and severe anemia, hemoglobin of 6.7 g/dL.  Patient has been transfused 2 units of packed red blood cells.  Weakness is thought to be multifactorial.   Workup revealed Pseudomonas UTI and sigmoid diverticulitis with presumed abscess concerning for focal/contained perforation.  General surgery also consulted on admission. Imaging studies also showed severe left hydronephrosis and hydroureter with ureter dilated to the level of the pelvis/area of inflammatory change at the sigmoid colon.  Creatinine also elevated at 2.2 on admission due to concern for bladder outlet obstruction. Lower extremity duplex also showed age-indeterminate DVT involving RLE and acute DVT involving LLE.   Assessment & Plan:   AKI on CKD stage IIIb BOO/Left hydronephrosis Creatinine improved to 1.65 which is close to baseline of 1.6 - Renal ultrasound shows severe left hydronephrosis; also noted on CT was hydroureter with ureter dilated to the level of the pelvis/area of inflammatory change at the sigmoid.  Urology following plan for stent placement if serum creatinine failed to improve - Creatinine improved, therefore urology holding off on stent placement  - Renal ultrasound repeated on 08/11/2024.  Moderate to severe but stable left hydronephrosis; renal function continues to improve as well - Discussed with urology.  TOV started 12/19; so far voiding well without foley - Creatinine has remained stable post discontinuation of  Foley -Eventual outpatient follow-up with urology for repeat imaging again   Diverticulitis with abscess - CT on 08/04/2024 shows diffuse diverticular disease of the colon with presumed sigmoid diverticulitis and gas fluid collection involving left pelvic sidewall measuring 5.9 cm concerning for focal contained perforation or abscess - General Surgery following, no recommendations for intervention at this time.  Fluid collection is near iliac bone and not amenable for drainage - 2 weeks antibiotics recommended per surgery - transition abx to Augmentin  to complete course; completed 12/24 - Diet has been advanced and patient tolerating without abdominal pain -  Left sided pleural effusion Fever Patient with recurrent fevers, Tmax of 101.5,which  could be due to ongoing malignancy/bilateral lower extremity DVT, PNA. Check procalcitonin, MRSA.SABRAPCR for COVID and flu/RSV negative.  Patient denies worsening shortness of breath but has been limited in current physical activity level CT concerning for moderate left-sided pleural effusion. suspect pleural effusion is likely malignant. Consulted IR for thoracentesis, pulmonology consulted for assistance with management of pleural effusions. -  Bilateral LE DVT - Significant edema noted in LLE and duplex shows acute DVT involving left common femoral vein, SF junction, femoral vein, proximal profunda vein, popliteal vein, posterior tibial vein, peroneal vein, external iliac vein - Age-indeterminate RLE DVT involving right popliteal, posterior tibial, peroneal veins - resume Lovenox  on 12/17 as no plans for stent/procedures  - transitioned to Eliquis  on 12/19 for ease of administration; poor candidate for Lovenox  at home due to cognitive issues  LLE SVT - Duplex also noted superficial venous thrombosis involving left great saphenous vein and acute intramuscular thrombosis involving left gastrocnemius veins and left soleal veins  Metastatic non-small  cell lung  cancer with solitary cerebral brain metastasis -Follows with Dr. Sherrod and rad onc.  Completed concurrent chemoradiation and 1 year of Durvalumab  consolidation therapy.   - Currently on stereotactic radiosurgery for brain mets.  She completed the course of Decadron .   - Plan of care also following, appreciate assistance.  Patient has difficulty partaking with GOC discussions and adequate comprehension/concentration centered around discussions -Remaining full code and full scope for now but needs outpatient palliative care and/or hospice to continue following at discharge  Physical deconditioning -Patient lives with son but in need of SNF; ultimately would benefit from LTC especially with memory problems lately  - will ask for PT/OT to remain on team given no longer pursuing comfort care - denied for SNF; denied by [name] as well; likely will need max HH arranged and still will need hospice to follow at home on discharge as well - appeal sent for SNF denial in efforts to help patient get to rehab at least short term given multiple acute issues as noted above; she is not at baseline as she was at home with [facility] previously and has potential for rehabing    Chronic hypoxic respiratory failure/COPD -Currently not on exacerbation.  On 3 L of oxygen  chronically at home.  Continue bronchodilators, inhalers  Acute on chronic normocytic anemia -Baseline hemoglobin around 7 - 8 g/dL - Admitted with hemoglobin of 6.8 g/dL.  Likely associated with malignancy.  No evidence of acute blood loss.   - s/p 2 units PRBC since admission  -Continue ferrous sulfate .   Hypertension - Blood pressure is controlled - Takes metoprolol  at home.  Currently on hold   CAD Elevated troponin  -On aspirin , Lipitor , Repatha .  No anginal symptoms. Takes aspirin  at home, currently on hold, will resume on discharge if no evidence of GI bleed. -Denied chest pain therefore elevated troponin is likely from demand ischemia  and from anemia.   History of skin cancer -Underwent resection of the skin tumor on the left leg on October of this year.  Wound care consulted.  Wound appears to be slightly infected with purulent material.  Patient is on IV meropenem    History of recurrent UTI -On chronic therapy with nitrofurantoin . She has noted frequency, dysuria symptoms.  Also complained of left lower quadrant pain.  Checking CT abdomen/pelvis without contrast.  Started on ceftriaxone .  Urine culture showing 30,000 colonies of Pseudomonas along with mixed organisms. - s/p meropenem  initially   Interval History:  Patient has no acute complaints, complains of lower abdominal pain prior to having bowel movements.  Otherwise she seems to be doing okay.  Denies chest pain, shortness of breath, nausea, vomiting chills.  She denies difficulty voiding, dysuria.   Antimicrobials: Rocephin  08/03/2024 >> 08/05/2024 Flagyl  08/04/2024 >> 08/08/2024 Meropenem  08/06/2024 >> 08/10/2024 Augmentin  08/10/2024 >> 12/24  DVT prophylaxis:  SCDs Start: 08/03/24 1141 apixaban  (ELIQUIS ) tablet 5 mg   Code Status:   Code Status: Full Code  Mobility Assessment (Last 72 Hours)     Mobility Assessment     Row Name 08/22/24 0000 08/21/24 0740 08/21/24 0000 08/20/24 0817 08/19/24 2150   Does the patient have exclusion criteria? No- Perform mobility assessment No- Perform mobility assessment No- Perform mobility assessment No- Perform mobility assessment No- Perform mobility assessment   What is the highest level of mobility based on the mobility assessment? Level 4 (Ambulates with assistance) - Balance while stepping forward/back - Complete Level 4 (Ambulates with assistance) - Balance while stepping forward/back - Complete Level 4 (  Ambulates with assistance) - Balance while stepping forward/back - Complete Level 4 (Ambulates with assistance) - Balance while stepping forward/back - Complete Level 4 (Ambulates with assistance) - Balance while  stepping forward/back - Complete   Is the above level different from baseline mobility prior to current illness? Yes - Recommend PT order Yes - Recommend PT order Yes - Recommend PT order Yes - Recommend PT order Yes - Recommend PT order    Row Name 08/19/24 1039           Does the patient have exclusion criteria? No- Perform mobility assessment       What is the highest level of mobility based on the mobility assessment? Level 4 (Ambulates with assistance) - Balance while stepping forward/back - Complete       Is the above level different from baseline mobility prior to current illness? Yes - Recommend PT order          Diet: Diet Orders (From admission, onward)     Start     Ordered   08/12/24 0732  Diet regular Room service appropriate? Yes; Fluid consistency: Thin  Diet effective now       Question Answer Comment  Room service appropriate? Yes   Fluid consistency: Thin      08/12/24 0731           CT CHEST WITHOUT CONTRAST 08/21/2024 12:17:42 PM   TECHNIQUE: CT of the chest was performed without the administration of intravenous contrast. Multiplanar reformatted images are provided for review. Automated exposure control, iterative reconstruction, and/or weight based adjustment of the mA/kV was utilized to reduce the radiation dose to as low as reasonably achievable.   COMPARISON: Portable chest from today, portable chest from 06/06/2024, chest CTs without contrast 06/07/2024 and 04/04/2024, and CTA chest 10/11/2023.   CLINICAL HISTORY: pleural effusion Prior left-sided lung cancer, post-treatment.   FINDINGS:   MEDIASTINUM: Mild cardiomegaly. Small chronic pericardial effusion. The coronary arteries are heavily calcified. The aorta is heavily calcified. There is mild aortic tortuosity without aneurysm. Moderate calcific plaques in the great vessels.   There is a prominent pulmonary trunk measuring 3.4 cm indicating pulmonary arterial hypertension. Pulmonary  veins are nondistended. . The central airways are clear. The trachea is patent. There is a moderate to large-sized hiatal hernia.   LYMPH NODES: No intrathoracic adenopathy is seen Without contrast; assessment for hilar adenopathy is limited on the left greater than right.   LUNGS AND PLEURA: There is a moderate-sized left pleural effusion, interval increased. Some of the fluid extends subpulmonic and could be partially loculated. . Most of it layers posteriorly.   The fluid is low density, 9 to 14 HU, and appears uncomplicated. There is a minimal right pleural effusion, new since the prior study, with adjacent atelectasis in the lower lobe.   There is compressive collapse or consolidation of the left lower lobe basal segments and basal lingula. Post-treatment changes on the left similar in appearance to the prior studies although it would be better demonstrated with IV contrast.   There is left upper lobe paramediastinal fibrosis with consolidation and air bronchograms with contiguous extension into the medial aspect of the superior segment of the left lower lobe, with architectural distortion and volume loss with linear scarring in the adjacent left upper lobe.   Mild posterior atelectasis in the lungs. Lungs are mildly emphysematous with centrilobular change predominating.   The remaining right lung is clear. No pulmonary edema. No pneumothorax.   SOFT TISSUES/BONES: Osteopenia,  kypholevoscoliosis and advanced degenerative change of the thoracic spine with multilevel interbody ankylosis.   No bone metastasis is seen. There is a chronic healed fracture of the proximal left humerus. There is moderate stranding and patchy subcutaneous fluid in the lower lateral left chest wall, question whether due to third spacing, local trauma, or cellulitis.   UPPER ABDOMEN: There is increased left hydronephrosis new from the last CT and only partially assessed ; further evaluation  recommended. Small left renal cysts including hyperdense cysts are again shown.   Limited images of the upper abdomen demonstrates no other acute abnormality.   IMPRESSION: 1. Interval increased moderate-sized left pleural effusion with possible partial loculation inferiorly . 2. Compressive collapse or consolidation of the left lower lobe basal segments and basal lingula. 3. New minimal right pleural effusion with adjacent atelectasis in the lower lobe. 4. Moderate stranding and patchy subcutaneous fluid in the lower lateral left chest wall. Etiology indeterminate . 5. Post-treatment changes in the left upper lobe with paramediastinal fibrosis, consolidation and air bronchograms, with contiguous extension into the medial aspect of the superior segment of the left lower lobe. 6. Increased left hydronephrosis, new from the last CT, further evaluation recommended. 7. Prominent pulmonary trunk measuring 3.4 cm, consistent with pulmonary arterial hypertension. 8. No bone metastasis is seen. Barriers to discharge: Patient changing mind regarding GOC Disposition Plan: Home with home health and hospice HH orders placed: TBD Status is: Inpatient  Objective: Blood pressure (!) 158/73, pulse 79, temperature 98 F (36.7 C), temperature source Oral, resp. rate 18, height 5' 6 (1.676 m), weight 71.5 kg, SpO2 97%.  Examination:  Constitutional: NAD, calm, comfortable Respiratory: Coarse bilateral lung sounds with crackles throughout.  Di bilateral lower extremity edema left markedly so with 3+ right with this.  Minished on the left to the base.  Normal respiratory effort. No accessory muscle use.  3 L Cardiovascular: Regular rate and rhythm, no murmurs / rubs / gallops. Abdomen: no tenderness, no masses palpated. No hepatosplenomegaly. Bowel sounds positive.  Musculoskeletal: no clubbing / cyanosis. No joint deformity upper and lower extremities. Good ROM, no contractures. Normal muscle tone.   Skin: no rashes, lesions, venous stasis ulcer left lower extremity anterior surface covered with dressing.  No induration Neurologic: CN 2-12 grossly intact. Sensation intact, Strength 4/5 x all 4 extremities.  Able to roll self in bed with minimal assistance.  Does report vertigo-like symptoms when repositioning head rapidly to the right Psychiatric: Alert and oriented x name and partially to place. Normal mood.     Consultants:  Palliative care General Surgery-signed off Urology  Procedures:    Data Reviewed: Results for orders placed or performed during the hospital encounter of 08/03/24 (from the past 24 hours)  Resp panel by RT-PCR (RSV, Flu A&B, Covid) Anterior Nasal Swab     Status: None   Collection Time: 08/21/24  8:20 AM   Specimen: Anterior Nasal Swab  Result Value Ref Range   SARS Coronavirus 2 by RT PCR NEGATIVE NEGATIVE   Influenza A by PCR NEGATIVE NEGATIVE   Influenza B by PCR NEGATIVE NEGATIVE   Resp Syncytial Virus by PCR NEGATIVE NEGATIVE  Glucose, capillary     Status: Abnormal   Collection Time: 08/21/24  5:28 PM  Result Value Ref Range   Glucose-Capillary 135 (H) 70 - 99 mg/dL  Glucose, capillary     Status: Abnormal   Collection Time: 08/21/24  9:21 PM  Result Value Ref Range   Glucose-Capillary 104 (H) 70 -  99 mg/dL  Glucose, capillary     Status: Abnormal   Collection Time: 08/22/24  7:36 AM  Result Value Ref Range   Glucose-Capillary 102 (H) 70 - 99 mg/dL    I have reviewed pertinent nursing notes, vitals, labs, and images as necessary. I have ordered labwork to follow up on as indicated.  I have reviewed the last notes from staff over past 24 hours. I have discussed patient's care plan and test results with nursing staff, CM/SW, and other staff as appropriate.  Old records reviewed in assessment of this patient    LOS: 19 days   Landon FORBES Baller, ANP [facility] 08/22/2024, 8:15 AM "

## 2024-08-22 NOTE — Progress Notes (Signed)
 MEWS Progress Note  Patient Details Name: Abigail [name] MRN: [id number] DOB: 04-Oct-1935 Today's Date: 08/22/2024  Temp was 101.5- came down to 99.5 after Tylenol  and lowering the temperature in the room which was set to 85 degrees.  MEWS Flowsheet Documentation:  Assess: MEWS Score Temp: 99.5 F (37.5 C) BP: (!) 145/57 MAP (mmHg): 83 Pulse Rate: 91 ECG Heart Rate: 82 Resp: 18 Level of Consciousness: Alert SpO2: 95 % O2 Device: Nasal Cannula Patient Activity (if Appropriate): In bed O2 Flow Rate (L/min): 3 L/min Assess: MEWS Score MEWS Temp: 0 MEWS Systolic: 0 MEWS Pulse: 0 MEWS RR: 0 MEWS LOC: 0 MEWS Score: 0 MEWS Score Color: Green Assess: SIRS CRITERIA SIRS Temperature : 0 SIRS Respirations : 0 SIRS Pulse: 1 SIRS WBC: 0 SIRS Score Sum : 1 Assess: if the MEWS score is Yellow or Red Were vital signs accurate and taken at a resting state?: Yes Does the patient meet 2 or more of the SIRS criteria?: No MEWS guidelines implemented : Yes, yellow Treat MEWS Interventions: Considered administering scheduled or prn medications/treatments as ordered Take Vital Signs Increase Vital Sign Frequency : Yellow: Q2hr x1, continue Q4hrs until patient remains green for 12hrs Escalate MEWS: Escalate: Yellow: Discuss with charge nurse and consider notifying provider and/or RRT        Tawni Pouch 08/22/2024, 1:27 AM

## 2024-08-22 NOTE — Consult Note (Addendum)
 "   NAME:  Abigail [name], MRN:  [id number], DOB:  [birth year], LOS: 19 ADMISSION DATE:  08/03/2024, CONSULTATION DATE:  08/22/24 REFERRING MD:  Dr. Carlota, CHIEF COMPLAINT:  pleural effusion   History of Present Illness:  Abigail [name] is a 88 y.o. female with PMH of Metastatic non-small cell lung cancer (2022) with solitary cerebral brain metastasis (found 06/2024), COPD, chronic Hypoxic Respiratory Failure on 3 L [place] at all times, Hypertension, and CAD who presented to the ED on 08/03/2024 for generalized weakness with resultant fall that had progressed since starting radiation therapy on her brain, decreased PO intake and urinary frequency with abdominal pain.   In the emergency department patient was found to have an elevated creatinine from baseline consistent with AKI on CKD, anemia with hemoglobin of 6.8, CT abdomen pelvis revealed moderate to severe left hydronephrosis and urinalysis consistent with urinary tract infection.  Urology was consulted and followed during the hospitalization, however did not feel urgent stent was indicated due to clinical improvement and improved renal function. CT A&P also showed diffuse diverticular disease of the colon with presumed sigmoid diverticulitis and gas fluid collection involving left pelvic sidewall measuring 5.9 cm concerning for focal contained perforation or abscess.  General surgery was consulted and recommended 2 weeks of antibiotics for which patient completed Augmentin  on 08/17/24.    Due to patient developing a fever of 101.5 on 12/28 chest x-ray was completed that revealed an increased left pleural effusion compared to prior imaging.  CT chest completed today revealed interval increased moderate size left pleural effusion with possible partial loculation.  Due to this finding, PCCM was consulted for further evaluation and management to include possible thoracentesis.  It appears that there have been ongoing conversations with patient's primary medical  team as well as palliative care regarding overall goals of care, at this time patient is unsure if she would like to continue further treatment or consider hospice.  Discussed with patient CXR/CT results and thoracentesis, patient is unsure if she would like to consent to this or not therefore we discussed that we would follow-up with her tomorrow.  In the meantime we will place Eliquis  on hold today 12/29 for anticipated thoracentesis Wednesday 1231.  Pertinent  Medical History   has a past medical history of Anemia, Arthritis, Bacteremia (04/08/2012), CAP (community acquired pneumonia) (04/12/2012), Carotid artery occlusion, Chronic kidney disease, Complete uterine prolapse with prolapse of anterior vaginal wall, Complication of anesthesia, Constipation, Coronary artery disease, Diverticulitis (yrs ago coialitis), Dyspnea, History of blood transfusion, History of radiation therapy, Hypertension, Hypothyroid, LLQ abdominal pain (04/05/2012), Lung cancer (HCC), Numbness, Pneumonia, Pre-diabetes, Scoliosis, STEMI (ST elevation myocardial infarction) (HCC) (10/26/2019), Wears dentures, and Wears glasses.   Significant Hospital Events: Including procedures, antibiotic start and stop dates in addition to other pertinent events   12/29 PCCM consulted for left pleural effusion, suspected malignant and consideration of thoracentesis   Interim History / Subjective:    Objective    Blood pressure (!) 136/52, pulse 84, temperature 98.5 F (36.9 C), temperature source Oral, resp. rate 14, height 5' 6 (1.676 m), weight 71.5 kg, SpO2 96%.        Intake/Output Summary (Last 24 hours) at 08/22/2024 1405 Last data filed at 08/22/2024 1030 Gross per 24 hour  Intake 960 ml  Output --  Net 960 ml   Filed Weights   08/03/24 0908 08/03/24 1359  Weight: 70.8 kg 71.5 kg    Examination: General: resting in bed, chronically ill and weak  appearing, no acute distress HENT: [place]/AT, pupils PEARL Lungs: clear  bilaterally, 3 L [place]  Cardiovascular: RRR  Abdomen: non distended, non tender, normoactive bowel sounds  Extremities: LLE edematous with chronic wound  Neuro: awake, alert, oriented to person, place, family   Resolved problem list   Assessment and Plan   Left Pleural Effusion, suspected to be malignant   Metastatic non-small cell lung cancer with solitary cerebral brain metastasis Chronic Hypoxic Respiratory Failure (3L [place] 24/7) - Completed chemoradiation 08/2021, and then 1 year of Durvalumab  consolidation therapy - Currently on stereotactic radiosurgery for brain mets.  She completed the course of Decadron .  - No prior thoracentesis or fluid analysis in EPIC - Discussed CT results and thoracentesis with patient, she is unsure if she would want to have a thoracentesis done while hospitalized. All questions answered. Patient did not want to involve additional family for decision making. Will need to follow up with patient regarding proceeding with thoracentesis. - In the meantime, Eliquis  placed on hold 12/29 for anticipated thoracentesis 08/24/24 if patient consents, and will follow up with IR at that time for availability  - Patient remains on her baseline O2 requirements   History of COPD  Chronic Hypoxic Respiratory Failure (3L [place] 24/7) -follows with pulmonary -on Breztri  BID with PRN albuterol    AKI on CKD stage IIIb, improved  Left hydronephrosis and hydroureter  -Cr stable, foley removed, urology did not feel acute indication for ureteral stent and following peripherally   Generalized weakness - Appears to be waiting on SNF placement  Pseudomonal UTI Sigmoid diverticulitis  Bilateral LE DVT  LLE SVT   Labs   CBC: Recent Labs  Lab 08/19/24 0328 08/21/24 0341  WBC 8.0 8.3  NEUTROABS 6.4 6.4  HGB 7.0* 7.3*  HCT 23.6* 24.5*  MCV 92.9 93.2  PLT 311 308    Basic Metabolic Panel: Recent Labs  Lab 08/19/24 0328 08/21/24 0341  NA 139 135  K 5.0 4.8  CL 107 102   CO2 25 23  GLUCOSE 147* 126*  BUN 26* 24*  CREATININE 1.46* 1.56*  CALCIUM  8.6* 8.5*   GFR: Estimated Creatinine Clearance: 26.2 mL/min (A) (by C-G formula based on SCr of 1.56 mg/dL (H)). Recent Labs  Lab 08/19/24 0328 08/21/24 0341  WBC 8.0 8.3    Liver Function Tests: No results for input(s): AST, ALT, ALKPHOS, BILITOT, PROT, ALBUMIN  in the last 168 hours. No results for input(s): LIPASE, AMYLASE in the last 168 hours. No results for input(s): AMMONIA in the last 168 hours.  ABG    Component Value Date/Time   TCO2 24 06/06/2024 1428     Coagulation Profile: No results for input(s): INR, PROTIME in the last 168 hours.  Cardiac Enzymes: No results for input(s): CKTOTAL, CKMB, CKMBINDEX, TROPONINI in the last 168 hours.  HbA1C: Hgb A1c MFr Bld  Date/Time Value Ref Range Status  12/22/2023 11:17 AM 6.3 (H) 4.8 - 5.6 % Final    Comment:             Prediabetes: 5.7 - 6.4          Diabetes: >6.4          Glycemic control for adults with diabetes: <7.0   08/24/2023 11:58 AM 5.8 (H) 4.8 - 5.6 % Final    Comment:             Prediabetes: 5.7 - 6.4          Diabetes: >6.4  Glycemic control for adults with diabetes: <7.0     CBG: Recent Labs  Lab 08/21/24 0800 08/21/24 1728 08/21/24 2121 08/22/24 0736 08/22/24 1119  GLUCAP 116* 135* 104* 102* 177*    Review of Systems:   All negative; except for those that are bolded, which indicate positives.  Constitutional: weight loss, weight gain, night sweats, fevers, chills, fatigue, weakness.  HEENT: headaches, sore throat, sneezing, nasal congestion, post nasal drip, difficulty swallowing, tooth/dental problems, visual complaints, visual changes, ear aches. Neuro: difficulty with speech, weakness, numbness, ataxia. CV:  chest pain, orthopnea, PND, swelling in lower extremities, dizziness, palpitations, syncope.  Resp: cough, hemoptysis, dyspnea, wheezing. GI: heartburn,  indigestion, abdominal pain, nausea, vomiting, diarrhea, constipation, change in bowel habits, loss of appetite, hematemesis, melena, hematochezia.  GU: dysuria, change in color of urine, urgency or frequency, flank pain, hematuria. MSK: joint pain or swelling, decreased range of motion. Psych: change in mood or affect, depression, anxiety, suicidal ideations, homicidal ideations. Skin: rash, itching, bruising.   Past Medical History:  She,  has a past medical history of Anemia, Arthritis, Bacteremia (04/08/2012), CAP (community acquired pneumonia) (04/12/2012), Carotid artery occlusion, Chronic kidney disease, Complete uterine prolapse with prolapse of anterior vaginal wall, Complication of anesthesia, Constipation, Coronary artery disease, Diverticulitis (yrs ago coialitis), Dyspnea, History of blood transfusion, History of radiation therapy, Hypertension, Hypothyroid, LLQ abdominal pain (04/05/2012), Lung cancer (HCC), Numbness, Pneumonia, Pre-diabetes, Scoliosis, STEMI (ST elevation myocardial infarction) (HCC) (10/26/2019), Wears dentures, and Wears glasses.   Surgical History:   Past Surgical History:  Procedure Laterality Date   ANTERIOR AND POSTERIOR REPAIR WITH SACROSPINOUS FIXATION N/A 12/20/2020   Procedure: SACROSPINOUS LIGAMENT FIXATION;  Surgeon: Marilynne Rosaline SAILOR, MD;  Location: [facility];  Service: Gynecology;  Laterality: N/A;  Total time requested for all procedures is 2 hours   BACK SURGERY  1980 age 72   lower   bartholin cyst removal  age 45's   BLADDER SUSPENSION N/A 12/20/2020   Procedure: TRANSVAGINAL TAPE (TVT) PROCEDURE;  Surgeon: Marilynne Rosaline SAILOR, MD;  Location: [facility];  Service: Gynecology;  Laterality: N/A;   BRONCHIAL BRUSHINGS  07/15/2021   Procedure: BRONCHIAL BRUSHINGS;  Surgeon: Shelah Lamar RAMAN, MD;  Location: [facility] ENDOSCOPY;  Service: Pulmonary;;   BRONCHIAL NEEDLE ASPIRATION BIOPSY  07/15/2021   Procedure:  BRONCHIAL NEEDLE ASPIRATION BIOPSIES;  Surgeon: Shelah Lamar RAMAN, MD;  Location: [facility] ENDOSCOPY;  Service: Pulmonary;;   CORONARY/GRAFT ACUTE MI REVASCULARIZATION N/A 10/26/2019   Procedure: Coronary/Graft Acute MI Revascularization;  Surgeon: Wonda Sharper, MD;  Location: [facility] INVASIVE CV LAB;  Service: Cardiovascular;  Laterality: N/A;   CYSTOCELE REPAIR N/A 12/20/2020   Procedure: ANTERIOR AND POSTERIOR REPAIR WITH PERINEORRHAPHY;  Surgeon: Marilynne Rosaline SAILOR, MD;  Location: [facility];  Service: Gynecology;  Laterality: N/A;   CYSTOSCOPY N/A 12/20/2020   Procedure: CYSTOSCOPY;  Surgeon: Marilynne Rosaline SAILOR, MD;  Location: [facility];  Service: Gynecology;  Laterality: N/A;   ELBOW SURGERY  1990's   left   ENDOBRONCHIAL ULTRASOUND N/A 07/15/2021   Procedure: ENDOBRONCHIAL ULTRASOUND;  Surgeon: Shelah Lamar RAMAN, MD;  Location: [facility] ENDOSCOPY;  Service: Pulmonary;  Laterality: N/A;   HEMOSTASIS CONTROL  07/15/2021   Procedure: HEMOSTASIS CONTROL;  Surgeon: Shelah Lamar RAMAN, MD;  Location: [facility] ENDOSCOPY;  Service: Pulmonary;;   LEFT HEART CATH AND CORONARY ANGIOGRAPHY N/A 10/26/2019   Procedure: LEFT HEART CATH AND CORONARY ANGIOGRAPHY;  Surgeon: Wonda Sharper, MD;  Location: [facility] INVASIVE CV LAB;  Service: Cardiovascular;  Laterality: N/A;   TONSILLECTOMY       Social History:   reports that she quit smoking about 38 years ago. Her smoking use included cigarettes. She started smoking about 73 years ago. She has a 52.5 pack-year smoking history. She has never used smokeless tobacco. She reports that she does not drink alcohol and does not use drugs.   Family History:  Her family history includes Hypertension in her mother. There is no history of Colon cancer, Stomach cancer, or Esophageal cancer.   Allergies Allergies[1]   Home Medications  Prior to Admission medications  Medication Sig Start Date End Date Taking? Authorizing Provider  acetaminophen   (TYLENOL ) 500 MG tablet Take 1,000 mg by mouth every 6 (six) hours as needed (for pain).   Yes [provider]  albuterol  (VENTOLIN  HFA) 108 (90 Base) MCG/ACT inhaler TAKE 2 PUFFS BY MOUTH EVERY 6 HOURS AS NEEDED FOR WHEEZE OR SHORTNESS OF BREATH Patient taking differently: Inhale 2 puffs into the lungs every 6 (six) hours as needed for wheezing or shortness of breath. 12/20/21  Yes Heilingoetter, Cassandra L, PA-C  aspirin  EC 81 MG tablet Take 81 mg by mouth at bedtime.   Yes [provider]  atorvastatin  (LIPITOR ) 80 MG tablet Take 40 mg by mouth every evening. 04/15/24  Yes [provider]  BREZTRI  AEROSPHERE 160-9-4.8 MCG/ACT AERO inhaler INHALE 2 PUFFS INTO THE LUNGS IN THE MORNING AND ALSO 2 PUFFS AT BEDTIME 12/08/23  Yes Byrum, Lamar RAMAN, MD  Evolocumab  (REPATHA  SURECLICK) 140 MG/ML SOAJ Inject 140 mg into the skin every 14 (fourteen) days. 04/14/24  Yes Patwardhan, Manish J, MD  feeding supplement (ENSURE PLUS HIGH PROTEIN) LIQD Take 237 mLs by mouth 2 (two) times daily between meals. Patient taking differently: Take 237 mLs by mouth daily. 06/10/24  Yes Dahal, Chapman, MD  ferrous sulfate  325 (65 FE) MG EC tablet TAKE 1 TABLET BY MOUTH EVERY DAY WITH BREAKFAST Patient taking differently: Take 325 mg by mouth daily with breakfast. 06/27/24  Yes Heilingoetter, Cassandra L, PA-C  HYDROcodone -acetaminophen  (NORCO/VICODIN) 5-325 MG tablet Take 1-2 tablets by mouth every 6 (six) hours as needed for moderate pain (pain score 4-6). 06/10/24  Yes Dahal, Chapman, MD  levothyroxine  (SYNTHROID ) 175 MCG tablet TAKE 1 TABLET BY MOUTH EVERY MORNING ON MONDAY - FRIDAY Patient taking differently: Take 175 mcg by mouth. Take 175 mcg by mouth 30 minutes before breakfast on Mon/Tues/Wed/Thurs/Fri- not on Saturdays or Sundays 04/14/24  Yes [name], FNP  metoprolol  succinate (TOPROL -XL) 50 MG 24 hr tablet Take 50 mg by mouth every evening. 04/15/24  Yes [provider]   nitrofurantoin  (MACRODANTIN ) 100 MG capsule Take 100 mg by mouth daily.   Yes [provider]  nitrofurantoin , macrocrystal-monohydrate, (MACROBID ) 100 MG capsule Take 100 mg by mouth daily.   Yes [provider]  nitroGLYCERIN  (NITROSTAT ) 0.4 MG SL tablet PLACE 1 TABLET UNDER THE TONGUE EVERY 5 (FIVE) MINUTES X 3 DOSES AS NEEDED FOR CHEST PAIN. 07/28/23  Yes [name], FNP  OXYGEN  Inhale 3 L/min into the lungs continuous.   Yes [provider]  polyethylene glycol powder (GLYCOLAX /MIRALAX ) 17 GM/SCOOP powder Dissolve 1 capful (17g) in 4-8 ounces of liquid and take by mouth daily as needed for mild constipation. 06/10/24  Yes Dahal, Chapman, MD  silver  sulfADIAZINE  (SILVADENE ) 1 % cream Apply to affected area twice a week 07/11/24 07/11/25 Yes [name], FNP  dexamethasone  (DECADRON ) 2 MG tablet Take 4 tablets daily with breakfast through 10/29. Reduce  to 2 tablets daily with breakfast from 10/30 through 11/12. Reduce to 1 tablet daily with breakfast from 11/13 through 11/27. Last dose: 11/27. Patient not taking: Reported on 08/03/2024 06/14/24   Izell Domino, MD  estradiol  (ESTRACE ) 0.1 MG/GM vaginal cream Place 0.5g twice a week at opening of vagina Patient not taking: Reported on 08/03/2024 10/13/23   Marilynne Rosaline SAILOR, MD  fluconazole  (DIFLUCAN ) 100 MG tablet Take 2 tablets today, then 1 tablet daily x 13 more days. HOLD ATORVASTATIN  WHILE ON THIS. Patient not taking: Reported on 08/03/2024 06/20/24   Izell Domino, MD  trospium  (SANCTURA ) 20 MG tablet Take 1 tablet (20 mg total) by mouth 2 (two) times daily. Patient not taking: Reported on 08/03/2024 10/13/23 10/07/24  Marilynne Rosaline SAILOR, MD     Critical care time: 30 minutes     Jeffre Enriques, PA-C [facility] For pager details, please see AMION or use Epic chat  After 1900, please call [facility] for cross coverage needs 08/22/2024, 2:05 PM       [1]   Allergies Allergen Reactions   Codeine Nausea And Vomiting   Norvasc  [Amlodipine ] Rash   "

## 2024-08-22 NOTE — Plan of Care (Signed)

## 2024-08-22 NOTE — Progress Notes (Signed)
 Physical Therapy Treatment Patient Details Name: Abigail [name] MRN: [id number] DOB: September 25, 1935 Today's Date: 08/22/2024   History of Present Illness Pt is an 88y.o female admitted on 08/03/24 with generalized weakness. She fell at home while trying to put her pants on and was unable to get up. PMH includes: metastatic non-small lung cancer to brain, currently on radiation treatment, chronic hypoxic respiratory failure on 3 L O2, CKD stage IIIb, 2 skin cancers on her legs s/p resection, hypertension, coronary artery disease, chronic normocytic anemia, hypothyroidism.    PT Comments  PT - Cognition Comments: AxO x 3 pleasant Lady who was getting OOB with RN to [facility]. General transfer comment: Required increased time and assist to transfer on and off near by [facility] with c/o ABD discomfort and MAX c/o fatigue/weakness overall feeling very tired.  Remained on 3 lts sats 98%.  Required assist with peri care after void/BM due to fatigue and impaired standing balance.  HIGH FALL RISK. General Gait Details: Very limited amb distance from near by [facility] back to bed due to MAX c/o fatigue.  Very unsteady gait and required assist to navigate walker to complete a safe turn and take a few steps backward.General bed mobility comments: Required Max Assist to lift L LE up onto bed (heavy/edema)  Pt unable to lift on her own. Positioned to comfort.  Prior to admit: Home Living Family/patient expects to be discharged to:: Private residence Living Arrangements: Children (son, [name]) Available Help at Discharge: Family;Available PRN/intermittently (pt home alone during day as son works) Type of Home: House Home Access: Stairs to enter Entrance Stairs-Rails: None Secretary/administrator of Steps: 1 Home Layout: One level Home Equipment: Shower seat;Grab bars - tub/shower;Hand held shower head;Toilet riser;Rollator (4 wheels);Cane - quad Additional Comments: on 3L O2 at baseline however removes to walk to bathroom. Pt reports  that son isn't able to reliably help her and they take care of their own things at home regarding IADLs     Prior Function Prior Level of Function : Independent/Modified Independent Mobility Comments: walks with rollator, on 3L O2 24/7 but then reports taking it off to go to the bathroom as she has tripped over O2 line when in bathroom before, denies falls in past 6 months ADLs Comments: doesn't drive; independent with showering using shower seat  LPT has rec Pt will need ST Rehab at SNF to address mobility and functional decline prior to safely returning home.        If plan is discharge home, recommend the following: A little help with walking and/or transfers;A little help with bathing/dressing/bathroom;Assist for transportation;Assistance with cooking/housework   Can travel by private vehicle     No  Equipment Recommendations  None recommended by PT    Recommendations for Other Services       Precautions / Restrictions Precautions Precautions: Fall Precaution/Restrictions Comments: O2 dep at baseline-3L COPD Restrictions Weight Bearing Restrictions Per Provider Order: No Other Position/Activity Restrictions: WBAT     Mobility  Bed Mobility Overal bed mobility: Needs Assistance Bed Mobility: Sit to Supine       Sit to supine: Max assist   General bed mobility comments: Required Max Assist to lift L LE up onto bed (heavy/edema)  Pt unable to lift on her own.    Transfers Overall transfer level: Needs assistance Equipment used: Rolling walker (2 wheels) Transfers: Sit to/from Stand Sit to Stand: Mod assist, Max assist           General transfer comment: Required  increased time and assist to transfer on and off near by [facility] with c/o ABD discomfort and MAX c/o fatigue/weakness overall feeling very tired.  Remained on 3 lts sats 98%.  Required assist with peri care after void/BM due to fatigue and impaired standing balance.  HIGH FALL RISK.     Ambulation/Gait Ambulation/Gait assistance: Mod assist, Max assist Gait Distance (Feet): 4 Feet Assistive device: Rolling walker (2 wheels) Gait Pattern/deviations: Step-through pattern, Decreased stride length Gait velocity: decreased     General Gait Details: Very limited amb distance from near by [facility] back to bed due to MAX c/o fatigue.  Very unsteady gait and required assist to navigate walker to complete a safe turn and take a few steps backward.   Stairs             Wheelchair Mobility     Tilt Bed    Modified Rankin (Stroke Patients Only)       Balance                                            Communication    Cognition Arousal: Alert     PT - Cognitive impairments: No apparent impairments                       PT - Cognition Comments: AxO x 3 pleasant Lady who was getting OOB with RN to [facility].        Cueing    Exercises      General Comments        Pertinent Vitals/Pain Pain Assessment Pain Assessment: Faces Pain Descriptors / Indicators: Discomfort, Aching Pain Intervention(s): Monitored during session    Home Living                          Prior Function            PT Goals (current goals can now be found in the care plan section) Progress towards PT goals: Progressing toward goals    Frequency    Min 2X/week      PT Plan      Co-evaluation              AM-PAC PT 6 Clicks Mobility   Outcome Measure  Help needed turning from your back to your side while in a flat bed without using bedrails?: A Lot Help needed moving from lying on your back to sitting on the side of a flat bed without using bedrails?: A Lot Help needed moving to and from a bed to a chair (including a wheelchair)?: A Lot Help needed standing up from a chair using your arms (e.g., wheelchair or bedside chair)?: A Lot Help needed to walk in hospital room?: A Lot Help needed climbing 3-5 steps with a railing? :  Total 6 Click Score: 11    End of Session Equipment Utilized During Treatment: Gait belt;Oxygen  Activity Tolerance: Patient limited by fatigue Patient left: with call bell/phone within reach;with family/visitor present;in bed;with bed alarm set Nurse Communication: Mobility status PT Visit Diagnosis: Unsteadiness on feet (R26.81);Muscle weakness (generalized) (M62.81);Difficulty in walking, not elsewhere classified (R26.2)     Time: 8853-8799 PT Time Calculation (min) (ACUTE ONLY): 14 min  Charges:    $Gait Training: 8-22 mins PT General Charges $$ ACUTE PT VISIT: 1 Visit                     {  Katheryn Leap  PTA Acute  Rehabilitation Services Office M-F          [phone]

## 2024-08-23 ENCOUNTER — Inpatient Hospital Stay (HOSPITAL_COMMUNITY)

## 2024-08-23 DIAGNOSIS — N179 Acute kidney failure, unspecified: Secondary | ICD-10-CM

## 2024-08-23 DIAGNOSIS — J9611 Chronic respiratory failure with hypoxia: Secondary | ICD-10-CM | POA: Diagnosis not present

## 2024-08-23 DIAGNOSIS — B965 Pseudomonas (aeruginosa) (mallei) (pseudomallei) as the cause of diseases classified elsewhere: Secondary | ICD-10-CM

## 2024-08-23 DIAGNOSIS — C7931 Secondary malignant neoplasm of brain: Secondary | ICD-10-CM | POA: Diagnosis not present

## 2024-08-23 DIAGNOSIS — N390 Urinary tract infection, site not specified: Secondary | ICD-10-CM

## 2024-08-23 DIAGNOSIS — C3490 Malignant neoplasm of unspecified part of unspecified bronchus or lung: Secondary | ICD-10-CM | POA: Diagnosis not present

## 2024-08-23 DIAGNOSIS — J90 Pleural effusion, not elsewhere classified: Secondary | ICD-10-CM | POA: Diagnosis not present

## 2024-08-23 HISTORY — PX: IR THORACENTESIS ASP PLEURAL SPACE W/IMG GUIDE: IMG5380

## 2024-08-23 LAB — GLUCOSE, CAPILLARY
Glucose-Capillary: 114 mg/dL — ABNORMAL HIGH (ref 70–99)
Glucose-Capillary: 152 mg/dL — ABNORMAL HIGH (ref 70–99)
Glucose-Capillary: 122 mg/dL — ABNORMAL HIGH (ref 70–99)
Glucose-Capillary: 119 mg/dL — ABNORMAL HIGH (ref 70–99)

## 2024-08-23 LAB — COMPREHENSIVE METABOLIC PANEL WITH GFR
ALT: <5 U/L (ref 0–44)
AST: 17 U/L (ref 15–41)
Albumin: 2.9 g/dL — ABNORMAL LOW (ref 3.5–5.0)
Alkaline Phosphatase: 125 U/L (ref 38–126)
Anion gap: 13 (ref 5–15)
BUN: 30 mg/dL — ABNORMAL HIGH (ref 8–23)
CO2: 22 mmol/L (ref 22–32)
Calcium: 8.9 mg/dL (ref 8.9–10.3)
Chloride: 101 mmol/L (ref 98–111)
Creatinine, Ser: 1.58 mg/dL — ABNORMAL HIGH (ref 0.44–1.00)
GFR, Estimated: 32 mL/min — ABNORMAL LOW
Glucose, Bld: 124 mg/dL — ABNORMAL HIGH (ref 70–99)
Potassium: 4.6 mmol/L (ref 3.5–5.1)
Sodium: 135 mmol/L (ref 135–145)
Total Bilirubin: 0.4 mg/dL (ref 0.0–1.2)
Total Protein: 6.5 g/dL (ref 6.5–8.1)

## 2024-08-23 LAB — CBC
HCT: 27.8 % — ABNORMAL LOW (ref 36.0–46.0)
Hemoglobin: 8.3 g/dL — ABNORMAL LOW (ref 12.0–15.0)
MCH: 27.5 pg (ref 26.0–34.0)
MCHC: 29.9 g/dL — ABNORMAL LOW (ref 30.0–36.0)
MCV: 92.1 fL (ref 80.0–100.0)
Platelets: 362 K/uL (ref 150–400)
RBC: 3.02 MIL/uL — ABNORMAL LOW (ref 3.87–5.11)
RDW: 17.1 % — ABNORMAL HIGH (ref 11.5–15.5)
WBC: 9.5 K/uL (ref 4.0–10.5)
nRBC: 0 % (ref 0.0–0.2)

## 2024-08-23 LAB — BODY FLUID CELL COUNT WITH DIFFERENTIAL
Eos, Fluid: 0 %
Lymphs, Fluid: 9 %
Monocyte-Macrophage-Serous Fluid: 19 % — ABNORMAL LOW (ref 50–90)
Neutrophil Count, Fluid: 72 % — ABNORMAL HIGH (ref 0–25)
Total Nucleated Cell Count, Fluid: 3231 uL — ABNORMAL HIGH (ref 0–1000)

## 2024-08-23 MED ORDER — LIDOCAINE-EPINEPHRINE 1 %-1:100000 IJ SOLN
INTRAMUSCULAR | Status: AC
  Filled 2024-08-23: qty 20

## 2024-08-23 MED ORDER — LIDOCAINE-EPINEPHRINE 1 %-1:100000 IJ SOLN
20.0000 mL | Freq: Once | INTRAMUSCULAR | Status: AC
  Administered 2024-08-23: 20 mL via INTRADERMAL
  Filled 2024-08-23: qty 20

## 2024-08-23 NOTE — Progress Notes (Signed)
 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   Patient was admitted with generalized weakness and severe anemia, hemoglobin of 6.7 g/dL.  Patient has been transfused 2 units of packed red blood cells.  Weakness is thought to be multifactorial.   Workup revealed Pseudomonas UTI and sigmoid diverticulitis with presumed abscess concerning for focal/contained perforation.  General surgery also consulted on admission. Imaging studies also showed severe left hydronephrosis and hydroureter with ureter dilated to the level of the pelvis/area of inflammatory change at the sigmoid colon.  Creatinine also elevated at 2.2 on admission due to concern for bladder outlet obstruction. Lower extremity duplex also showed age-indeterminate DVT involving RLE and acute DVT involving LLE.   Assessment & Plan: Left sided pleural effusion  Metastatic non-small cell lung cancer with solitary cerebral brain metastasis Follows with Dr. Sherrod and rad onc.  Completed concurrent chemoradiation and 1 year of Durvalumab  consolidation therapy.   - Currently on stereotactic radiosurgery for brain mets.  She completed the course of Decadron .   -CT Chest 08/21/2024 concerning for moderate left-sided pleural effusion. suspect pleural effusion is likely malignant. Consulted IR for thoracentesis, Eliquis  On hold pulmonology consulted for assistance with management of pleural effusions. - Plan of care also following, appreciate assistance.  Patient has difficulty partaking with GOC discussions and adequate  comprehension/concentration centered around discussions -Remaining full code and full scope for now but needs outpatient palliative care and/or hospice to continue following at discharge -Re consult Hospice  AKI on CKD stage IIIb BOO/Left hydronephrosis Creatinine improved to 1.65 which is close to baseline of 1.6 - Renal ultrasound shows severe left hydronephrosis; also noted on CT was hydroureter with ureter dilated to the level of the pelvis/area of inflammatory change at the sigmoid.  Urology following plan for stent placement if serum creatinine failed to improve - Creatinine improved, therefore urology holding off on stent placement  - Renal ultrasound repeated on 08/11/2024.  Moderate to severe but stable left hydronephrosis; renal function continues to improve as well - Discussed with urology.  TOV started 12/19; so far voiding well without foley - Creatinine has remained stable post discontinuation of Foley -Eventual outpatient follow-up with urology for repeat imaging again   Diverticulitis with abscess - CT on 08/04/2024 shows diffuse diverticular disease of the colon with presumed sigmoid diverticulitis and gas fluid collection involving left pelvic sidewall measuring 5.9 cm concerning for focal contained perforation or abscess - General Surgery following, no recommendations for intervention at this time.  Fluid collection is near iliac bone and not amenable for drainage - 2 weeks antibiotics recommended per surgery - transition abx to Augmentin  to complete course; completed 12/24 - Diet has been advanced and patient tolerating without abdominal pain -   Fever Patient with recurrent fevers, afebrile for past 24hrs  Suspect fevers due to malignancy/bilateral lower extremity DVT. Low suspicion for PNA, normal  procalcitonin, MRSA.SABRAPCR for COVID and flu/RSV negative.  Patient denies worsening shortness of breath but has been limited in current physical activity level  Bilateral LE  DVT - Significant edema noted in LLE  and duplex shows acute DVT involving left common femoral vein, SF junction, femoral vein, proximal profunda vein, popliteal vein, posterior tibial vein, peroneal vein, external iliac vein - Age-indeterminate RLE DVT involving right popliteal, posterior tibial, peroneal veins - resume Lovenox  on 12/17 as no plans for stent/procedures  - transitioned to Eliquis  on 12/19 for ease of administration; poor candidate for Lovenox  at home due to cognitive issues  LLE SVT - Duplex also noted superficial venous thrombosis involving left great saphenous vein and acute intramuscular thrombosis involving left gastrocnemius veins and left soleal veins   Physical deconditioning -Patient lives with son but in need of SNF; ultimately would benefit from LTC especially with memory problems lately  - will ask for PT/OT to remain on team given no longer pursuing comfort care - denied for SNF; denied by [name] as well; likely will need max HH arranged and still will need hospice to follow at home on discharge as well - appeal sent for SNF denial in efforts to help patient get to rehab at least short term given multiple acute issues as noted above; she is not at baseline as she was at home with [facility] previously and has potential for rehabing    Chronic hypoxic respiratory failure/COPD -Currently not on exacerbation.  On 3 L of oxygen  chronically at home.  Continue bronchodilators, inhalers  Acute on chronic normocytic anemia -Baseline hemoglobin around 7 - 8 g/dL - Admitted with hemoglobin of 6.8 g/dL.  Likely associated with malignancy.  No evidence of acute blood loss.   - s/p 2 units PRBC since admission  -Continue ferrous sulfate .   Hypertension - Blood pressure is controlled - Takes metoprolol  at home.  Currently on hold   CAD Elevated troponin  -On aspirin , Lipitor , Repatha .  No anginal symptoms. Takes aspirin  at home, currently on hold, will resume on discharge if no  evidence of GI bleed. -Denied chest pain therefore elevated troponin is likely from demand ischemia and from anemia.   History of skin cancer -Underwent resection of the skin tumor on the left leg on October of this year.  Wound care consulted.  Wound appears to be slightly infected with purulent material.  Patient is on IV meropenem    History of recurrent UTI -On chronic therapy with nitrofurantoin . She has noted frequency, dysuria symptoms.  Also complained of left lower quadrant pain.  Checking CT abdomen/pelvis without contrast.  Started on ceftriaxone .  Urine culture showing 30,000 colonies of Pseudomonas along with mixed organisms. - s/p meropenem  initially   Interval History:  Patient has no acute complaints,She denies shortness of breath, chest pain, cough, nausea, vomiting. She remains at 3L oxygen  baseline. She has been afebrile for the past 24 hours.   Antimicrobials: Rocephin  08/03/2024 >> 08/05/2024 Flagyl  08/04/2024 >> 08/08/2024 Meropenem  08/06/2024 >> 08/10/2024 Augmentin  08/10/2024 >> 12/24  DVT prophylaxis:  SCDs Start: 08/03/24 1141   Code Status:   Code Status: Full Code  Mobility Assessment (Last 72 Hours)     Mobility Assessment     Row Name 08/22/24 2110 08/22/24 1222 08/22/24 1106 08/22/24 0000 08/21/24 0740   Does the patient have exclusion criteria? No- Perform mobility assessment -- No- Perform mobility assessment No- Perform mobility assessment No- Perform mobility assessment   What is the highest level of mobility based on the mobility assessment? Level 4 (Ambulates with assistance) - Balance while stepping forward/back - Complete Level 4 (Ambulates with assistance) - Balance while stepping forward/back - Complete Level 4 (Ambulates with assistance) - Balance while  stepping forward/back - Complete Level 4 (Ambulates with assistance) - Balance while stepping forward/back - Complete Level 4 (Ambulates with assistance) - Balance while stepping forward/back -  Complete   Is the above level different from baseline mobility prior to current illness? Yes - Recommend PT order -- Yes - Recommend PT order Yes - Recommend PT order Yes - Recommend PT order    Row Name 08/21/24 0000           Does the patient have exclusion criteria? No- Perform mobility assessment       What is the highest level of mobility based on the mobility assessment? Level 4 (Ambulates with assistance) - Balance while stepping forward/back - Complete       Is the above level different from baseline mobility prior to current illness? Yes - Recommend PT order          Diet: Diet Orders (From admission, onward)     Start     Ordered   08/12/24 0732  Diet regular Room service appropriate? Yes; Fluid consistency: Thin  Diet effective now       Question Answer Comment  Room service appropriate? Yes   Fluid consistency: Thin      08/12/24 0731           CT CHEST WITHOUT CONTRAST 08/21/2024 12:17:42 PM   TECHNIQUE: CT of the chest was performed without the administration of intravenous contrast. Multiplanar reformatted images are provided for review. Automated exposure control, iterative reconstruction, and/or weight based adjustment of the mA/kV was utilized to reduce the radiation dose to as low as reasonably achievable.   COMPARISON: Portable chest from today, portable chest from 06/06/2024, chest CTs without contrast 06/07/2024 and 04/04/2024, and CTA chest 10/11/2023.   CLINICAL HISTORY: pleural effusion Prior left-sided lung cancer, post-treatment.   FINDINGS:   MEDIASTINUM: Mild cardiomegaly. Small chronic pericardial effusion. The coronary arteries are heavily calcified. The aorta is heavily calcified. There is mild aortic tortuosity without aneurysm. Moderate calcific plaques in the great vessels.   There is a prominent pulmonary trunk measuring 3.4 cm indicating pulmonary arterial hypertension. Pulmonary veins are nondistended. . The central airways  are clear. The trachea is patent. There is a moderate to large-sized hiatal hernia.   LYMPH NODES: No intrathoracic adenopathy is seen Without contrast; assessment for hilar adenopathy is limited on the left greater than right.   LUNGS AND PLEURA: There is a moderate-sized left pleural effusion, interval increased. Some of the fluid extends subpulmonic and could be partially loculated. . Most of it layers posteriorly.   The fluid is low density, 9 to 14 HU, and appears uncomplicated. There is a minimal right pleural effusion, new since the prior study, with adjacent atelectasis in the lower lobe.   There is compressive collapse or consolidation of the left lower lobe basal segments and basal lingula. Post-treatment changes on the left similar in appearance to the prior studies although it would be better demonstrated with IV contrast.   There is left upper lobe paramediastinal fibrosis with consolidation and air bronchograms with contiguous extension into the medial aspect of the superior segment of the left lower lobe, with architectural distortion and volume loss with linear scarring in the adjacent left upper lobe.   Mild posterior atelectasis in the lungs. Lungs are mildly emphysematous with centrilobular change predominating.   The remaining right lung is clear. No pulmonary edema. No pneumothorax.   SOFT TISSUES/BONES: Osteopenia, kypholevoscoliosis and advanced degenerative change of the thoracic spine with  multilevel interbody ankylosis.   No bone metastasis is seen. There is a chronic healed fracture of the proximal left humerus. There is moderate stranding and patchy subcutaneous fluid in the lower lateral left chest wall, question whether due to third spacing, local trauma, or cellulitis.   UPPER ABDOMEN: There is increased left hydronephrosis new from the last CT and only partially assessed ; further evaluation recommended. Small left renal cysts  including hyperdense cysts are again shown.   Limited images of the upper abdomen demonstrates no other acute abnormality.   IMPRESSION: 1. Interval increased moderate-sized left pleural effusion with possible partial loculation inferiorly . 2. Compressive collapse or consolidation of the left lower lobe basal segments and basal lingula. 3. New minimal right pleural effusion with adjacent atelectasis in the lower lobe. 4. Moderate stranding and patchy subcutaneous fluid in the lower lateral left chest wall. Etiology indeterminate . 5. Post-treatment changes in the left upper lobe with paramediastinal fibrosis, consolidation and air bronchograms, with contiguous extension into the medial aspect of the superior segment of the left lower lobe. 6. Increased left hydronephrosis, new from the last CT, further evaluation recommended. 7. Prominent pulmonary trunk measuring 3.4 cm, consistent with pulmonary arterial hypertension. 8. No bone metastasis is seen. Barriers to discharge: Patient changing mind regarding GOC Disposition Plan: Home with home health and hospice HH orders placed: TBD Status is: Inpatient  Objective: Blood pressure (!) 123/53, pulse 80, temperature 98.4 F (36.9 C), temperature source Oral, resp. rate 17, height 5' 6 (1.676 m), weight 71.5 kg, SpO2 97%.  Examination:  Constitutional: NAD, calm, comfortable Respiratory: Coarse bilateral lung sounds with crackles throughout.  Di bilateral lower extremity edema left markedly so with 3+ right with this.  Minished on the left to the base.  Normal respiratory effort. No accessory muscle use.  3 L Cardiovascular: Regular rate and rhythm, no murmurs / rubs / gallops. Abdomen: no tenderness, no masses palpated. No hepatosplenomegaly. Bowel sounds positive.  Musculoskeletal: no clubbing / cyanosis. No joint deformity upper and lower extremities. Good ROM, no contractures. Normal muscle tone.  Skin: no rashes, lesions, venous  stasis ulcer left lower extremity anterior surface covered with dressing.  No induration Neurologic: CN 2-12 grossly intact. Sensation intact, Strength 4/5 x all 4 extremities.  Able to roll self in bed with minimal assistance.  Does report vertigo-like symptoms when repositioning head rapidly to the right Psychiatric: Alert and oriented x name and partially to place. Normal mood.     Consultants:  Palliative care General Surgery-signed off Urology  Procedures:    Data Reviewed: Results for orders placed or performed during the hospital encounter of 08/03/24 (from the past 24 hours)  Procalcitonin     Status: None   Collection Time: 08/22/24  2:39 PM  Result Value Ref Range   Procalcitonin 0.93 ng/mL  MRSA Next Gen by PCR, Nasal     Status: None   Collection Time: 08/22/24  3:47 PM   Specimen: Nasal Mucosa; Nasal Swab  Result Value Ref Range   MRSA by PCR Next Gen NOT DETECTED NOT DETECTED  Glucose, capillary     Status: Abnormal   Collection Time: 08/22/24  4:42 PM  Result Value Ref Range   Glucose-Capillary 140 (H) 70 - 99 mg/dL  Glucose, capillary     Status: Abnormal   Collection Time: 08/22/24 10:17 PM  Result Value Ref Range   Glucose-Capillary 153 (H) 70 - 99 mg/dL  Glucose, capillary     Status: Abnormal  Collection Time: 08/23/24  7:29 AM  Result Value Ref Range   Glucose-Capillary 114 (H) 70 - 99 mg/dL  CBC     Status: Abnormal   Collection Time: 08/23/24  8:56 AM  Result Value Ref Range   WBC 9.5 4.0 - 10.5 K/uL   RBC 3.02 (L) 3.87 - 5.11 MIL/uL   Hemoglobin 8.3 (L) 12.0 - 15.0 g/dL   HCT 72.1 (L) 63.9 - 53.9 %   MCV 92.1 80.0 - 100.0 fL   MCH 27.5 26.0 - 34.0 pg   MCHC 29.9 (L) 30.0 - 36.0 g/dL   RDW 82.8 (H) 88.4 - 84.4 %   Platelets 362 150 - 400 K/uL   nRBC 0.0 0.0 - 0.2 %  Comprehensive metabolic panel     Status: Abnormal   Collection Time: 08/23/24  8:56 AM  Result Value Ref Range   Sodium 135 135 - 145 mmol/L   Potassium 4.6 3.5 - 5.1 mmol/L    Chloride 101 98 - 111 mmol/L   CO2 22 22 - 32 mmol/L   Glucose, Bld 124 (H) 70 - 99 mg/dL   BUN 30 (H) 8 - 23 mg/dL   Creatinine, Ser 8.41 (H) 0.44 - 1.00 mg/dL   Calcium  8.9 8.9 - 10.3 mg/dL   Total Protein 6.5 6.5 - 8.1 g/dL   Albumin  2.9 (L) 3.5 - 5.0 g/dL   AST 17 15 - 41 U/L   ALT <5 0 - 44 U/L   Alkaline Phosphatase 125 38 - 126 U/L   Total Bilirubin 0.4 0.0 - 1.2 mg/dL   GFR, Estimated 32 (L) >60 mL/min   Anion gap 13 5 - 15  Glucose, capillary     Status: Abnormal   Collection Time: 08/23/24 11:10 AM  Result Value Ref Range   Glucose-Capillary 152 (H) 70 - 99 mg/dL    I have reviewed pertinent nursing notes, vitals, labs, and images as necessary. I have ordered labwork to follow up on as indicated.  I have reviewed the last notes from staff over past 24 hours. I have discussed patient's care plan and test results with nursing staff, CM/SW, and other staff as appropriate.  Old records reviewed in assessment of this patient    LOS: 20 days   Landon FORBES Baller, MD [facility] 08/23/2024, 11:26 AM "

## 2024-08-23 NOTE — Plan of Care (Signed)
" °  Problem: Education: Goal: Knowledge of General Education information will improve Description: Including pain rating scale, medication(s)/side effects and non-pharmacologic comfort measures Outcome: Progressing   Problem: Health Behavior/Discharge Planning: Goal: Ability to manage health-related needs will improve Outcome: Progressing   Problem: Clinical Measurements: Goal: Ability to maintain clinical measurements within normal limits will improve Outcome: Progressing Goal: Will remain free from infection Outcome: Progressing Goal: Diagnostic test results will improve Outcome: Progressing Goal: Respiratory complications will improve Outcome: Progressing Goal: Cardiovascular complication will be avoided Outcome: Progressing   Problem: Activity: Goal: Risk for activity intolerance will decrease Outcome: Adequate for Discharge   Problem: Nutrition: Goal: Adequate nutrition will be maintained Outcome: Progressing   Problem: Coping: Goal: Level of anxiety will decrease Outcome: Progressing   Problem: Pain Managment: Goal: General experience of comfort will improve and/or be controlled Outcome: Progressing   Problem: Safety: Goal: Ability to remain free from injury will improve Outcome: Progressing   Problem: Skin Integrity: Goal: Risk for impaired skin integrity will decrease Outcome: Progressing   Problem: Health Behavior/Discharge Planning: Goal: Ability to manage health-related needs will improve Outcome: Progressing   Problem: Metabolic: Goal: Ability to maintain appropriate glucose levels will improve Outcome: Progressing   Problem: Nutritional: Goal: Maintenance of adequate nutrition will improve Outcome: Progressing   Problem: Skin Integrity: Goal: Risk for impaired skin integrity will decrease Outcome: Progressing   Problem: Tissue Perfusion: Goal: Adequacy of tissue perfusion will improve Outcome: Progressing   "

## 2024-08-23 NOTE — Procedures (Signed)
 PROCEDURE SUMMARY:  Successful image-guided left thoracentesis. Yielded 700 mL of hazy orange fluid. Patient tolerated procedure well. EBL: trace No immediate complications.  Specimen was sent for labs. Post procedure CXR ordered.  Please see imaging section of Epic for full dictation.  [facility] DEL Darious Rehman PA-C 08/23/2024 3:16 PM

## 2024-08-23 NOTE — Progress Notes (Addendum)
 "   NAME:  Abigail [name], MRN:  [id number], DOB:  [birth year], LOS: 20 ADMISSION DATE:  08/03/2024, CONSULTATION DATE:  08/22/24 REFERRING MD:  Dr. Carlota, CHIEF COMPLAINT:  pleural effusion   History of Present Illness:  Abigail [name] is a 88 y.o. female with PMH of Metastatic non-small cell lung cancer (2022) with solitary cerebral brain metastasis (found 06/2024), COPD, chronic Hypoxic Respiratory Failure on 3 L [place] at all times, Hypertension, and CAD who presented to the ED on 08/03/2024 for generalized weakness with resultant fall that had progressed since starting radiation therapy on her brain, decreased PO intake and urinary frequency with abdominal pain.   In the emergency department patient was found to have an elevated creatinine from baseline consistent with AKI on CKD, anemia with hemoglobin of 6.8, CT abdomen pelvis revealed moderate to severe left hydronephrosis and urinalysis consistent with urinary tract infection.  Urology was consulted and followed during the hospitalization, however did not feel urgent stent was indicated due to clinical improvement and improved renal function. CT A&P also showed diffuse diverticular disease of the colon with presumed sigmoid diverticulitis and gas fluid collection involving left pelvic sidewall measuring 5.9 cm concerning for focal contained perforation or abscess.  General surgery was consulted and recommended 2 weeks of antibiotics for which patient completed Augmentin  on 08/17/24.    Due to patient developing a fever of 101.5 on 12/28 chest x-ray was completed that revealed an increased left pleural effusion compared to prior imaging.  CT chest completed today revealed interval increased moderate size left pleural effusion with possible partial loculation.  Due to this finding, PCCM was consulted for further evaluation and management to include possible thoracentesis.  It appears that there have been ongoing conversations with patient's primary medical  team as well as palliative care regarding overall goals of care, at this time patient is unsure if she would like to continue further treatment or consider hospice.  Discussed with patient CXR/CT results and thoracentesis, patient is unsure if she would like to consent to this or not therefore we discussed that we would follow-up with her tomorrow.  In the meantime we will place Eliquis  on hold today 12/29 for anticipated thoracentesis Wednesday 1231.  Pertinent  Medical History   has a past medical history of Anemia, Arthritis, Bacteremia (04/08/2012), CAP (community acquired pneumonia) (04/12/2012), Carotid artery occlusion, Chronic kidney disease, Complete uterine prolapse with prolapse of anterior vaginal wall, Complication of anesthesia, Constipation, Coronary artery disease, Diverticulitis (yrs ago coialitis), Dyspnea, History of blood transfusion, History of radiation therapy, Hypertension, Hypothyroid, LLQ abdominal pain (04/05/2012), Lung cancer (HCC), Numbness, Pneumonia, Pre-diabetes, Scoliosis, STEMI (ST elevation myocardial infarction) (HCC) (10/26/2019), Wears dentures, and Wears glasses.   Significant Hospital Events: Including procedures, antibiotic start and stop dates in addition to other pertinent events   12/29 PCCM consulted for left pleural effusion, suspected malignant and consideration of thoracentesis   Interim History / Subjective:   Patient sitting up in bed eating lunch, reports her room is stuffy. Patient denies any chest pain or shortness of breath but thinks the effort may be different. Patient agreeable to thoracentesis tomorrow after further discussion.   Objective    Blood pressure (!) 123/53, pulse 80, temperature 98.4 F (36.9 C), temperature source Oral, resp. rate 17, height 5' 6 (1.676 m), weight 71.5 kg, SpO2 97%.        Intake/Output Summary (Last 24 hours) at 08/23/2024 1331 Last data filed at 08/23/2024 1300 Gross per 24 hour  Intake  480 ml   Output 0 ml  Net 480 ml   Filed Weights   08/03/24 0908 08/03/24 1359  Weight: 70.8 kg 71.5 kg    Examination: General: resting in bed, chronically ill and weak appearing, no acute distress HENT: [place]/AT, pupils PEARL Lungs: clear bilaterally, 3 L [place]  Cardiovascular: RRR  Abdomen: non distended, non tender, normoactive bowel sounds  Extremities: LLE edematous with chronic wound  Neuro: awake, alert, oriented to person, place, family   Resolved problem list   Assessment and Plan   Left Pleural Effusion, suspected to be malignant   Metastatic non-small cell lung cancer with solitary cerebral brain metastasis Chronic Hypoxic Respiratory Failure (3L [place] 24/7) - Completed chemoradiation 08/2021, and then 1 year of Durvalumab  consolidation therapy - Currently on stereotactic radiosurgery for brain mets.  She completed the course of Decadron .  - No prior thoracentesis or fluid analysis in EPIC - Eliquis  placed on hold 12/29 for anticipated thoracentesis 08/24/24. Patient agreeable to procedure, appears that IR is already on consult for this. - Patient remains on her baseline O2 requirements   History of COPD  Chronic Hypoxic Respiratory Failure (3L [place] 24/7) -follows with pulmonary -on Breztri  BID with PRN albuterol    AKI on CKD stage IIIb, improved  Left hydronephrosis and hydroureter  -Cr stable, foley removed, urology did not feel acute indication for ureteral stent and following peripherally   Generalized weakness - Appears to be waiting on SNF placement  Pseudomonal UTI Sigmoid diverticulitis  Bilateral LE DVT  LLE SVT   Review of Systems:   See subjective    Past Medical History:  She,  has a past medical history of Anemia, Arthritis, Bacteremia (04/08/2012), CAP (community acquired pneumonia) (04/12/2012), Carotid artery occlusion, Chronic kidney disease, Complete uterine prolapse with prolapse of anterior vaginal wall, Complication of anesthesia, Constipation,  Coronary artery disease, Diverticulitis (yrs ago coialitis), Dyspnea, History of blood transfusion, History of radiation therapy, Hypertension, Hypothyroid, LLQ abdominal pain (04/05/2012), Lung cancer (HCC), Numbness, Pneumonia, Pre-diabetes, Scoliosis, STEMI (ST elevation myocardial infarction) (HCC) (10/26/2019), Wears dentures, and Wears glasses.   Surgical History:   Past Surgical History:  Procedure Laterality Date   ANTERIOR AND POSTERIOR REPAIR WITH SACROSPINOUS FIXATION N/A 12/20/2020   Procedure: SACROSPINOUS LIGAMENT FIXATION;  Surgeon: Marilynne Rosaline SAILOR, MD;  Location: [facility];  Service: Gynecology;  Laterality: N/A;  Total time requested for all procedures is 2 hours   BACK SURGERY  1980 age 82   lower   bartholin cyst removal  age 47's   BLADDER SUSPENSION N/A 12/20/2020   Procedure: TRANSVAGINAL TAPE (TVT) PROCEDURE;  Surgeon: Marilynne Rosaline SAILOR, MD;  Location: [facility];  Service: Gynecology;  Laterality: N/A;   BRONCHIAL BRUSHINGS  07/15/2021   Procedure: BRONCHIAL BRUSHINGS;  Surgeon: Shelah Lamar RAMAN, MD;  Location: [facility] ENDOSCOPY;  Service: Pulmonary;;   BRONCHIAL NEEDLE ASPIRATION BIOPSY  07/15/2021   Procedure: BRONCHIAL NEEDLE ASPIRATION BIOPSIES;  Surgeon: Shelah Lamar RAMAN, MD;  Location: [facility] ENDOSCOPY;  Service: Pulmonary;;   CORONARY/GRAFT ACUTE MI REVASCULARIZATION N/A 10/26/2019   Procedure: Coronary/Graft Acute MI Revascularization;  Surgeon: Wonda Sharper, MD;  [facility] INVASIVE CV LAB;  Service: Cardiovascular;  Laterality: N/A;   CYSTOCELE REPAIR N/A 12/20/2020   Procedure: ANTERIOR AND POSTERIOR REPAIR WITH PERINEORRHAPHY;  Surgeon: Marilynne Rosaline SAILOR, MD;  Location: [facility];  Service: Gynecology;  Laterality: N/A;   CYSTOSCOPY N/A 12/20/2020   Procedure: CYSTOSCOPY;  Surgeon: Marilynne Rosaline SAILOR, MD;  Location: [facility]  [place];  Service: Gynecology;  Laterality: N/A;   ELBOW SURGERY   1990's   left   ENDOBRONCHIAL ULTRASOUND N/A 07/15/2021   Procedure: ENDOBRONCHIAL ULTRASOUND;  Surgeon: Shelah Lamar RAMAN, MD;  Location: [facility] ENDOSCOPY;  Service: Pulmonary;  Laterality: N/A;   HEMOSTASIS CONTROL  07/15/2021   Procedure: HEMOSTASIS CONTROL;  Surgeon: Shelah Lamar RAMAN, MD;  Location: [facility] ENDOSCOPY;  Service: Pulmonary;;   LEFT HEART CATH AND CORONARY ANGIOGRAPHY N/A 10/26/2019   Procedure: LEFT HEART CATH AND CORONARY ANGIOGRAPHY;  Surgeon: Wonda Sharper, MD;  Location: [facility] INVASIVE CV LAB;  Service: Cardiovascular;  Laterality: N/A;   TONSILLECTOMY       Social History:   reports that she quit smoking about 38 years ago. Her smoking use included cigarettes. She started smoking about 73 years ago. She has a 52.5 pack-year smoking history. She has never used smokeless tobacco. She reports that she does not drink alcohol and does not use drugs.   Family History:  Her family history includes Hypertension in her mother. There is no history of Colon cancer, Stomach cancer, or Esophageal cancer.   Allergies Allergies[1]   Home Medications  Prior to Admission medications  Medication Sig Start Date End Date Taking? Authorizing Provider  acetaminophen  (TYLENOL ) 500 MG tablet Take 1,000 mg by mouth every 6 (six) hours as needed (for pain).   Yes [provider]  albuterol  (VENTOLIN  HFA) 108 (90 Base) MCG/ACT inhaler TAKE 2 PUFFS BY MOUTH EVERY 6 HOURS AS NEEDED FOR WHEEZE OR SHORTNESS OF BREATH Patient taking differently: Inhale 2 puffs into the lungs every 6 (six) hours as needed for wheezing or shortness of breath. 12/20/21  Yes Heilingoetter, Cassandra L, PA-C  aspirin  EC 81 MG tablet Take 81 mg by mouth at bedtime.   Yes [provider]  atorvastatin  (LIPITOR ) 80 MG tablet Take 40 mg by mouth every evening. 04/15/24  Yes [provider]  BREZTRI  AEROSPHERE 160-9-4.8 MCG/ACT AERO inhaler INHALE 2 PUFFS INTO THE LUNGS IN THE MORNING AND ALSO 2 PUFFS AT  BEDTIME 12/08/23  Yes Byrum, Lamar RAMAN, MD  Evolocumab  (REPATHA  SURECLICK) 140 MG/ML SOAJ Inject 140 mg into the skin every 14 (fourteen) days. 04/14/24  Yes Patwardhan, Manish J, MD  feeding supplement (ENSURE PLUS HIGH PROTEIN) LIQD Take 237 mLs by mouth 2 (two) times daily between meals. Patient taking differently: Take 237 mLs by mouth daily. 06/10/24  Yes Dahal, Chapman, MD  ferrous sulfate  325 (65 FE) MG EC tablet TAKE 1 TABLET BY MOUTH EVERY DAY WITH BREAKFAST Patient taking differently: Take 325 mg by mouth daily with breakfast. 06/27/24  Yes Heilingoetter, Cassandra L, PA-C  HYDROcodone -acetaminophen  (NORCO/VICODIN) 5-325 MG tablet Take 1-2 tablets by mouth every 6 (six) hours as needed for moderate pain (pain score 4-6). 06/10/24  Yes Dahal, Chapman, MD  levothyroxine  (SYNTHROID ) 175 MCG tablet TAKE 1 TABLET BY MOUTH EVERY MORNING ON MONDAY - FRIDAY Patient taking differently: Take 175 mcg by mouth. Take 175 mcg by mouth 30 minutes before breakfast on Mon/Tues/Wed/Thurs/Fri- not on Saturdays or Sundays 04/14/24  Yes [name], FNP  metoprolol  succinate (TOPROL -XL) 50 MG 24 hr tablet Take 50 mg by mouth every evening. 04/15/24  Yes [provider]  nitrofurantoin  (MACRODANTIN ) 100 MG capsule Take 100 mg by mouth daily.   Yes [provider]  nitrofurantoin , macrocrystal-monohydrate, (MACROBID ) 100 MG capsule Take 100 mg by mouth daily.   Yes [provider]  nitroGLYCERIN  (NITROSTAT ) 0.4 MG SL tablet PLACE 1 TABLET UNDER  THE TONGUE EVERY 5 (FIVE) MINUTES X 3 DOSES AS NEEDED FOR CHEST PAIN. 07/28/23  Yes [name], FNP  OXYGEN  Inhale 3 L/min into the lungs continuous.   Yes [provider]  polyethylene glycol powder (GLYCOLAX /MIRALAX ) 17 GM/SCOOP powder Dissolve 1 capful (17g) in 4-8 ounces of liquid and take by mouth daily as needed for mild constipation. 06/10/24  Yes Dahal, Chapman, MD  silver  sulfADIAZINE  (SILVADENE ) 1 % cream Apply to affected area twice  a week 07/11/24 07/11/25 Yes [name], FNP  dexamethasone  (DECADRON ) 2 MG tablet Take 4 tablets daily with breakfast through 10/29. Reduce to 2 tablets daily with breakfast from 10/30 through 11/12. Reduce to 1 tablet daily with breakfast from 11/13 through 11/27. Last dose: 11/27. Patient not taking: Reported on 08/03/2024 06/14/24   Izell Domino, MD  estradiol  (ESTRACE ) 0.1 MG/GM vaginal cream Place 0.5g twice a week at opening of vagina Patient not taking: Reported on 08/03/2024 10/13/23   Marilynne Rosaline SAILOR, MD  fluconazole  (DIFLUCAN ) 100 MG tablet Take 2 tablets today, then 1 tablet daily x 13 more days. HOLD ATORVASTATIN  WHILE ON THIS. Patient not taking: Reported on 08/03/2024 06/20/24   Izell Domino, MD  trospium  (SANCTURA ) 20 MG tablet Take 1 tablet (20 mg total) by mouth 2 (two) times daily. Patient not taking: Reported on 08/03/2024 10/13/23 10/07/24  Marilynne Rosaline SAILOR, MD     Critical care time: 30 minutes     Maicee Ullman, PA-C [facility] For pager details, please see AMION or use Epic chat  After 1900, please call [facility] for cross coverage needs 08/23/2024, 1:31 PM        [1]  Allergies Allergen Reactions   Codeine Nausea And Vomiting   Norvasc  [Amlodipine ] Rash   "

## 2024-08-23 NOTE — TOC Progression Note (Signed)
 Transition of Care [facility]) - Progression Note    Patient Details  Name: RANISHA ALLAIRE MRN: [id number] Date of Birth: Feb 27, 1936  Transition of Care [id number]) CM/SW Contact  [name], RN Phone Number: 08/23/2024, 1:09 PM  Clinical Narrative:   NCM called to [employer] to check on status of SNF denial Appeal, spoke with Monico Monico confirmed receipt of Request for Appeal on 12/24, states Appeal has been sent for Independent Review Process as of 08/20/24, and is currently in pending status. Monico states will notify patient/CM with determination when available, team notified.     Expected Discharge Plan: Home w Home Health Services Barriers to Discharge: Continued Medical Work up               Expected Discharge Plan and Services       Living arrangements for the past 2 months: Single Family Home                                       Social Drivers of Health (SDOH) Interventions SDOH Screenings   Food Insecurity: No Food Insecurity (08/03/2024)  Housing: Low Risk (08/03/2024)  Transportation Needs: No Transportation Needs (08/03/2024)  Utilities: Not At Risk (08/03/2024)  Alcohol Screen: Low Risk (03/09/2024)  Depression (PHQ2-9): Low Risk (07/01/2024)  Financial Resource Strain: Medium Risk (03/09/2024)  Physical Activity: Inactive (03/09/2024)  Social Connections: Moderately Isolated (08/03/2024)  Stress: No Stress Concern Present (03/09/2024)  Recent Concern: Stress - Stress Concern Present (03/09/2024)  Tobacco Use: Medium Risk (08/05/2024)  Health Literacy: Adequate Health Literacy (03/09/2024)    Readmission Risk Interventions    08/23/2024    1:08 PM 06/07/2024    3:57 PM  Readmission Risk Prevention Plan  Transportation Screening Complete Complete  PCP or Specialist Appt within 3-5 Days  Complete  HRI or Home Care Consult  Complete  Social Work Consult for Recovery Care Planning/Counseling  Complete  Palliative Care Screening   Complete  Medication Review Oceanographer) Complete Referral to Pharmacy  PCP or Specialist appointment within 3-5 days of discharge Complete   HRI or Home Care Consult Complete   SW Recovery Care/Counseling Consult Complete   Palliative Care Screening Not Applicable   Skilled Nursing Facility Complete

## 2024-08-24 ENCOUNTER — Encounter: Payer: Self-pay | Admitting: Family Medicine

## 2024-08-24 ENCOUNTER — Telehealth: Payer: Self-pay | Admitting: Nurse Practitioner

## 2024-08-24 ENCOUNTER — Encounter (HOSPITAL_BASED_OUTPATIENT_CLINIC_OR_DEPARTMENT_OTHER): Admitting: Internal Medicine

## 2024-08-24 DIAGNOSIS — N1832 Chronic kidney disease, stage 3b: Secondary | ICD-10-CM | POA: Diagnosis not present

## 2024-08-24 DIAGNOSIS — N179 Acute kidney failure, unspecified: Secondary | ICD-10-CM | POA: Diagnosis not present

## 2024-08-24 DIAGNOSIS — D649 Anemia, unspecified: Secondary | ICD-10-CM | POA: Diagnosis not present

## 2024-08-24 LAB — COMPREHENSIVE METABOLIC PANEL WITH GFR
ALT: 8 U/L (ref 0–44)
AST: 17 U/L (ref 15–41)
Albumin: 2.4 g/dL — ABNORMAL LOW (ref 3.5–5.0)
Alkaline Phosphatase: 102 U/L (ref 38–126)
Anion gap: 8 (ref 5–15)
BUN: 32 mg/dL — ABNORMAL HIGH (ref 8–23)
CO2: 24 mmol/L (ref 22–32)
Calcium: 8.1 mg/dL — ABNORMAL LOW (ref 8.9–10.3)
Chloride: 105 mmol/L (ref 98–111)
Creatinine, Ser: 1.61 mg/dL — ABNORMAL HIGH (ref 0.44–1.00)
GFR, Estimated: 31 mL/min — ABNORMAL LOW
Glucose, Bld: 101 mg/dL — ABNORMAL HIGH (ref 70–99)
Potassium: 4.7 mmol/L (ref 3.5–5.1)
Sodium: 136 mmol/L (ref 135–145)
Total Bilirubin: 0.2 mg/dL (ref 0.0–1.2)
Total Protein: 5.3 g/dL — ABNORMAL LOW (ref 6.5–8.1)

## 2024-08-24 LAB — GLUCOSE, CAPILLARY
Glucose-Capillary: 102 mg/dL — ABNORMAL HIGH (ref 70–99)
Glucose-Capillary: 143 mg/dL — ABNORMAL HIGH (ref 70–99)
Glucose-Capillary: 128 mg/dL — ABNORMAL HIGH (ref 70–99)
Glucose-Capillary: 142 mg/dL — ABNORMAL HIGH (ref 70–99)

## 2024-08-24 LAB — CBC
HCT: 23.1 % — ABNORMAL LOW (ref 36.0–46.0)
Hemoglobin: 7 g/dL — ABNORMAL LOW (ref 12.0–15.0)
MCH: 27.6 pg (ref 26.0–34.0)
MCHC: 30.3 g/dL (ref 30.0–36.0)
MCV: 90.9 fL (ref 80.0–100.0)
Platelets: 322 K/uL (ref 150–400)
RBC: 2.54 MIL/uL — ABNORMAL LOW (ref 3.87–5.11)
RDW: 17.1 % — ABNORMAL HIGH (ref 11.5–15.5)
WBC: 7.6 K/uL (ref 4.0–10.5)
nRBC: 0 % (ref 0.0–0.2)

## 2024-08-24 LAB — LEGIONELLA PNEUMOPHILA SEROGP 1 UR AG: L. pneumophila Serogp 1 Ur Ag: NEGATIVE

## 2024-08-24 LAB — PATHOLOGIST SMEAR REVIEW

## 2024-08-24 LAB — LD, BODY FLUID (OTHER): LD, Body Fluid: 143 IU/L

## 2024-08-24 MED ORDER — APIXABAN 5 MG PO TABS
5.0000 mg | ORAL_TABLET | Freq: Two times a day (BID) | ORAL | Status: DC
  Administered 2024-08-24 – 2024-09-03 (×20): 5 mg via ORAL
  Filled 2024-08-24 (×20): qty 1

## 2024-08-24 MED ORDER — APIXABAN 5 MG PO TABS
5.0000 mg | ORAL_TABLET | ORAL | Status: AC
  Administered 2024-08-24: 5 mg via ORAL
  Filled 2024-08-24: qty 1

## 2024-08-24 NOTE — Care Management Important Message (Signed)
 Important Message  Patient Details IM Letter given. Name: Abigail [name] MRN: [id number] Date of Birth: 1936/01/11   Important Message Given:  Yes - Medicare IM     Melba Ates 08/24/2024, 10:08 AM

## 2024-08-24 NOTE — Progress Notes (Signed)
 PHARMACY - ANTICOAGULATION CONSULT NOTE  Pharmacy Consult for Eliquis  Indication: DVT  Allergies[1]  Patient Measurements: Height: 5' 6 (167.6 cm) Weight: 71.5 kg (157 lb 10.1 oz) IBW/kg (Calculated) : 59.3 HEPARIN  DW (KG): 71.5  Vital Signs: Temp: 99.1 F (37.3 C) (12/31 0636) Temp Source: Oral (12/31 0636) BP: 139/69 (12/31 0636) Pulse Rate: 87 (12/31 0636)  Labs: Recent Labs    08/23/24 0856 08/24/24 0400  HGB 8.3* 7.0*  HCT 27.8* 23.1*  PLT 362 322  CREATININE 1.58* 1.61*    Estimated Creatinine Clearance: 25.4 mL/min (A) (by C-G formula based on SCr of 1.61 mg/dL (H)).   Medical History: Past Medical History:  Diagnosis Date   Anemia    Arthritis    knees, back   Bacteremia 04/08/2012   CAP (community acquired pneumonia) 04/12/2012   Carotid artery occlusion    Chronic kidney disease    stage 3 ckd no nephrologist   Complete uterine prolapse with prolapse of anterior vaginal wall    Complication of anesthesia    hard to wake up per pt   Constipation    Coronary artery disease    Diverticulitis yrs ago coialitis   Dyspnea    History of blood transfusion    History of radiation therapy    right lung 08/05/2021-09/18/2021  Dr Lynwood Nasuti   Hypertension    Hypothyroid    LLQ abdominal pain 04/05/2012   Lung cancer (HCC)    Numbness    in hands at times   Pneumonia    Pre-diabetes    Scoliosis    STEMI (ST elevation myocardial infarction) (HCC) 10/26/2019   DES RCA   Wears dentures    full dentures   Wears glasses    for reading    Assessment: AC/Heme: 12/15 BL LE DVT with LLE superficial venous thrombosis  - Hgb lower at 7. Plts WNL   - 12/16: per Dr. Sherrod to ok to use Lovenox  - started 1mg /kg q24>>>DC'd, then resumed again 12/17 - 12/19: change Lovenox  to apixaban , - 12/29 Eliquis  held for thoracentesis - 12/31: Resume Eliquis . Copay $0 (unknown after first of the year 2026)  Goal of Therapy:  Therapeutic oral  anticoagulation  Plan: Resume Eliquis  5mg  BID   Dellis Voght Karoline Marina, PharmD, BCPS Clinical Staff Pharmacist Marina Salines [place] 08/24/2024,12:59 PM      [1]  Allergies Allergen Reactions   Codeine Nausea And Vomiting   Norvasc  [Amlodipine ] Rash

## 2024-08-24 NOTE — Discharge Instructions (Signed)
 Information on my medicine - ELIQUIS  (apixaban )  Why was Eliquis  prescribed for you? Eliquis  was prescribed to treat blood clots that may have been found in the veins of your legs (deep vein thrombosis) or in your lungs (pulmonary embolism) and to reduce the risk of them occurring again.  What do You need to know about Eliquis  ? The  dose is  ONE 5 mg tablet taken TWICE daily.  Eliquis  may be taken with or without food.   Try to take the dose about the same time in the morning and in the evening. If you have difficulty swallowing the tablet whole please discuss with your pharmacist how to take the medication safely.  Take Eliquis  exactly as prescribed and DO NOT stop taking Eliquis  without talking to the doctor who prescribed the medication.  Stopping may increase your risk of developing a new blood clot.  Refill your prescription before you run out.  After discharge, you should have regular check-up appointments with your healthcare provider that is prescribing your Eliquis .    What do you do if you miss a dose? If a dose of ELIQUIS  is not taken at the scheduled time, take it as soon as possible on the same day and twice-daily administration should be resumed. The dose should not be doubled to make up for a missed dose.  Important Safety Information A possible side effect of Eliquis  is bleeding. You should call your healthcare provider right away if you experience any of the following: ? Bleeding from an injury or your nose that does not stop. ? Unusual colored urine (red or dark brown) or unusual colored stools (red or black). ? Unusual bruising for unknown reasons. ? A serious fall or if you hit your head (even if there is no bleeding).  Some medicines may interact with Eliquis  and might increase your risk of bleeding or clotting while on Eliquis . To help avoid this, consult your healthcare provider or pharmacist prior to using any new prescription or non-prescription  medications, including herbals, vitamins, non-steroidal anti-inflammatory drugs (NSAIDs) and supplements.  This website has more information on Eliquis  (apixaban ): http://[email or web address] .com/eliquis dena

## 2024-08-24 NOTE — Plan of Care (Signed)
  Problem: Activity: Goal: Risk for activity intolerance will decrease Outcome: Progressing   Problem: Safety: Goal: Ability to remain free from injury will improve Outcome: Progressing   Problem: Pain Managment: Goal: General experience of comfort will improve and/or be controlled Outcome: Progressing

## 2024-08-24 NOTE — Progress Notes (Signed)
 OT Cancellation Note  Patient Details Name: LORIELLE BOEHNING MRN: [id number] DOB: 08-03-1936   Cancelled Treatment:    Reason Eval/Treat Not Completed: Patient declined, no reason specified Pt declined to participate in OT this PM stating that she was not feeling like it and wanted to rest. Declined all suggestions for therapy session.   Leita Howell, OTR/L,CBIS  Supplemental OT - [facility] and [facility] Secure Chat Preferred   08/24/2024, 4:16 PM

## 2024-08-24 NOTE — Telephone Encounter (Signed)
 Returned call to patient's daughter expressing concern that she was denied to get into the skilled nursing facility.  She reports that she would need to go to a skilled nursing facility and rehab center per her appeal to the insurance company.  After review of the hospital notes from today her peer to peer was denied to get her into a skilled nursing facility.  The daughter expresses concern that the patient will not be able for care for herself at home as her brother/patient's son works long hours throughout the week and will have a challenging time caring for his mother.  The daughter lives in [place]  and is unable to assist locally.  I explained to the daughter that I can try to communicate with the nurse case manager who has been involved with Ms. Neyer care in the past to see if there is anything we can do on our end to facilitate this admission to go to a skilled nursing facility.  I did also express to the daughter that we want to ensure that she is safe and the daughter agrees.  The daughter shares with me that the patient has 2 small dogs that she would need to care for at home as well.  She also expresses that she is been laying on a couch that has feces on it.  I will try to reach back out to her or have the nurse case manager reach out to her early part of next week due to the holiday.  Her daughter expresses appreciation for the phone call.

## 2024-08-24 NOTE — Progress Notes (Addendum)
 [facility] PROGRESS NOTE    Progress Note  Abigail [name]  FMW:[id number] DOB: 09/10/1935 DOA: 08/03/2024 PCP: [name], FNP     Brief Narrative:   Abigail [name] is an 88 y.o. female past medical history of metastatic non-small cell lung cancer in 2022 with a solitary brain mass found in November 2025, chronic respiratory failure with hypoxia on 3 L of oxygen , essential hypertension presented to the ED on 08/03/2024 for generalized weakness resulting in a fall that has progressed since she started radiation therapy decreased oral intake and urinary frequency along with abdominal pain.  In the ED was found to be in acute kidney injury, hemoglobin of 6 point CT scan of the abdomen and pelvis showed severe left hydronephrosis, urinalysis was compatible with urinary tract infection urology was consulted, however they did not feel stent was needed due to clinical improvement of her renal function.  CT scan of the abdomen pelvis was repeated and showed diffuse diverticular disease of the colon with diverticulitis and fluid collection involving the left side pelvic wall with a 6 cm contained perforation or abscess.  General surgery was consulted and recommended 2 weeks of antibiotics which she completed on 08/17/2024.  Due to developing fevers of 101.5 on 08/21/2024 chest x-ray was done that revealed a left-sided pleural effusion CT of the chest showed interval increase of moderate size left pleural effusion with possible loculation.  Due to this finding PCCM was consulted, but the patient is unsure whether to proceed.    Assessment/Plan:   Left-sided pleural effusion/with a history of non-small cell cancer with a solitary brain mets: Completed her chemotherapy and immunotherapy about a year ago with Dr. Gatha. Currently undergoing stereotactic radiosurgery for brain mets. A CT scan on 08/21/2024 showed moderate pleural effusion concerned about malignant pleural effusion. IR was consulted  for thoracocentesis Eliquis  was held. Due to her advanced and complicated disease palliative care was consulted and she remains a full code. To discuss with palliative care and pulmonary whether she would [name] to proceed with thoracocentesis. Eliquis  on hold in anticipation for thoracocentesis on 08/24/2024 if patient is agreeable with procedures. She awaiting insurance approval.  Acute kidney injury on chronic kidney disease stage IIIb/left hydronephrosis: Urology was consulted as there appeared to be hydronephrosis on imaging. As her infection was treated her creatinine improved therefore urology held down on stent placement. Repeated renal ultrasound on 08/13/2024 showed stable left hydronephrosis with improvement in renal function. Voiding trial has been successful. Will need further evaluation as an outpatient by urology.  Acute diverticulitis with abscess: Seen on imaging in 08/04/2024.  Measuring about 6 cm General Surgery was consulted, due to the location is not amenable for drainage. General surgery recommended 2 weeks of antibiotics for which she has completed a course on 08/17/2024. She is currently tolerating her diet.  Fevers: Have been recurrent suspect due to malignancy and possibly due to lower extremity DVT. Low suspicious for pneumonia, procalcitonin is low yield. Viral panel is negative.  Bilateral lower extremity DVT: Age indeterminant now transition to oral Eliquis  on 08/12/2024 she is a poor candidate for Lovenox  due to cognitive issues.  Physical deconditioning: PT OT evaluated the patient and she has been denied skilled nursing facility, peer to peer was performed and she still was denied. She will need max home health arrangement. Palliative care has been consulted. Patient lives with son and will need home health at home.  Chronic respiratory failure with hypoxia: At baseline continue 3 L of  oxygen .  Acute on chronic normocytic anemia: On admission  hemoglobin 6.8 status post 2 units packed red blood cells. Hemoglobin this morning 7.0. Recheck in the morning.  Essential hypertension: Toprol  on hold due to borderline blood pressure.  Elevated troponins: Continue aspirin  Lipitor  and Repatha . She denies any chest pain or shortness of breath. Elevation in troponins likely due to demand ischemia.  History of skin cancer: Follow-up with oncology as an outpatient.  History of recurrent UTIs: On chronic Macrobid  therapy as an outpatient.  She is completing her course of antibiotics in house. Will resume Macrobid  as an outpatient.   Sacral decubitus ulcer stage II: RN Pressure Injury Documentation: Wound 08/20/24 2000 Pressure Injury Buttocks Left Stage 2 -  Partial thickness loss of dermis presenting as a shallow open injury with a red, pink wound bed without slough. (Active)    DVT prophylaxis: Eliquis  Family Communication:son Status is: Inpatient Remains inpatient appropriate because: Generalized weakness physical deconditioning    Code Status:     Code Status Orders  (From admission, onward)           Start     Ordered   08/08/24 2020  Full code  (Code Status)  Continuous       Question:  By:  Answer:  Consent: discussion documented in EHR   08/08/24 2020           Code Status History     Date Active Date Inactive Code Status Order ID Comments User Context   08/08/2024 1541 08/08/2024 2020 Do not attempt resuscitation (DNR) - Comfort care [id number]  Jeryl Skeens, MD Inpatient   08/03/2024 1143 08/08/2024 1541 Full Code [id number]  Jillian Buttery, MD ED   06/06/2024 1503 06/10/2024 1740 Full Code [id number]  Sonjia Held, MD ED   01/29/2024 0026 02/04/2024 1730 Full Code [id number]  Lee Kingfisher, MD ED   09/07/2022 0236 09/07/2022 1607 Full Code [id number]  Howerter, Eva NOVAK, DO ED   12/20/2020 0905 12/20/2020 2312 Full Code [id number]  Marilynne Rosaline SAILOR, MD Inpatient   11/09/2020 0601 11/10/2020 2230 Full  Code [id number]  Lonzell Emeline HERO, DO ED   12/01/2019 1259 12/02/2019 1710 Full Code [id number]  Barrett, Shona MATSU, PA-C ED   10/27/2019 0030 10/28/2019 2035 Full Code [id number]  Martie Gatha ORN, MD Inpatient   10/27/2019 0016 10/27/2019 0029 Full Code [id number]  Wonda Sharper, MD Inpatient   10/27/2019 0016 10/27/2019 0016 Full Code [id number]  Wonda Sharper, MD Inpatient   04/05/2012 2048 04/12/2012 1621 Full Code [id number]  Olevia Josette CROME, RN Inpatient         IV Access:   Peripheral IV   Procedures and diagnostic studies:   DG Chest Port 1 View Result Date: 08/23/2024 CLINICAL DATA:  Status post thoracentesis. EXAM: PORTABLE CHEST 1 VIEW COMPARISON:  Chest radiograph dated 08/21/2024. FINDINGS: Decrease in the size of the left pleural effusion. No detectable pneumothorax. The right lung is clear. Stable cardiomegaly. No acute osseous pathology. IMPRESSION: Decrease in the size of the left pleural effusion. No detectable pneumothorax. Electronically Signed   By: Vanetta Chou M.D.   On: 08/23/2024 16:56   IR THORACENTESIS ASP PLEURAL SPACE W/IMG GUIDE Result Date: 08/23/2024 INDICATION: History of metastatic non-small-cell lung cancer. With left pleural effusion. Request for diagnostic and therapeutic left thoracentesis. EXAM: ULTRASOUND GUIDED DIAGNOSTIC AND THERAPEUTIC THORACENTESIS MEDICATIONS: 10 mL 1% lidocaine  COMPLICATIONS: None immediate. PROCEDURE: An ultrasound guided thoracentesis was thoroughly discussed with the patient and questions answered. The benefits, risks, alternatives and  complications were also discussed. The patient understands and wishes to proceed with the procedure. Written consent was obtained. Ultrasound was performed to localize and mark an adequate pocket of fluid in the left chest. The area was then prepped and draped in the normal sterile fashion. 1% Lidocaine  was used for local anesthesia. Under ultrasound guidance a 6 Fr Safe-T-Centesis catheter was introduced.  Thoracentesis was performed. The catheter was removed and a dressing applied. FINDINGS: A total of approximately 700 mL of hazy orange fluid was removed. Samples were sent to the laboratory as requested by the clinical team. IMPRESSION: Successful ultrasound guided left thoracentesis yielding 700 mL of pleural fluid. Performed and dictated by Kimble Clas, PA-C Electronically Signed   By: Ester Sides M.D.   On: 08/23/2024 15:20     Medical Consultants:   None.   Subjective:    Abigail [name] pain is controlled had a bowel movement and complains  Objective:    Vitals:   08/23/24 1353 08/23/24 2106 08/24/24 0627 08/24/24 0636  BP: (!) 120/58 (!) 125/48  139/69  Pulse: 80 90  87  Resp: 17 18  19   Temp: 98.2 F (36.8 C) (!) 101.4 F (38.6 C)  99.1 F (37.3 C)  TempSrc: Oral Oral  Oral  SpO2: 98% 97% 97% 95%  Weight:      Height:       SpO2: 95 % O2 Flow Rate (L/min): 2 L/min   Intake/Output Summary (Last 24 hours) at 08/24/2024 1221 Last data filed at 08/24/2024 0600 Gross per 24 hour  Intake 390 ml  Output 0 ml  Net 390 ml   Filed Weights   08/03/24 0908 08/03/24 1359  Weight: 70.8 kg 71.5 kg    Exam: General exam: In no acute distress. Respiratory system: Good air movement and clear to auscultation. Cardiovascular system: S1 & S2 heard, RRR. No JVD. Gastrointestinal system: Abdomen is nondistended, soft and nontender.  Extremities: No pedal edema. Skin: No rashes, lesions or ulcers Psychiatry: Judgement and insight appear normal. Mood & affect appropriate.    Data Reviewed:    Labs: Basic Metabolic Panel: Recent Labs  Lab 08/19/24 0328 08/21/24 0341 08/23/24 0856 08/24/24 0400  NA 139 135 135 136  K 5.0 4.8 4.6 4.7  CL 107 102 101 105  CO2 25 23 22 24   GLUCOSE 147* 126* 124* 101*  BUN 26* 24* 30* 32*  CREATININE 1.46* 1.56* 1.58* 1.61*  CALCIUM  8.6* 8.5* 8.9 8.1*   GFR Estimated Creatinine Clearance: 25.4 mL/min (A) (by C-G formula based on  SCr of 1.61 mg/dL (H)). Liver Function Tests: Recent Labs  Lab 08/23/24 0856 08/24/24 0400  AST 17 17  ALT <5 8  ALKPHOS 125 102  BILITOT 0.4 0.2  PROT 6.5 5.3*  ALBUMIN  2.9* 2.4*   No results for input(s): LIPASE, AMYLASE in the last 168 hours. No results for input(s): AMMONIA in the last 168 hours. Coagulation profile No results for input(s): INR, PROTIME in the last 168 hours. COVID-19 Labs  No results for input(s): DDIMER, FERRITIN, LDH, CRP in the last 72 hours.  Lab Results  Component Value Date   SARSCOV2NAA NEGATIVE 08/21/2024   SARSCOV2NAA NEGATIVE 08/03/2024   SARSCOV2NAA RESULT: NEGATIVE 07/12/2021   SARSCOV2NAA NEGATIVE 12/17/2020    CBC: Recent Labs  Lab 08/19/24 0328 08/21/24 0341 08/23/24 0856 08/24/24 0400  WBC 8.0 8.3 9.5 7.6  NEUTROABS 6.4 6.4  --   --   HGB 7.0* 7.3* 8.3* 7.0*  HCT 23.6*  24.5* 27.8* 23.1*  MCV 92.9 93.2 92.1 90.9  PLT 311 308 362 322   Cardiac Enzymes: No results for input(s): CKTOTAL, CKMB, CKMBINDEX, TROPONINI in the last 168 hours. BNP (last 3 results) No results for input(s): PROBNP in the last 8760 hours. CBG: Recent Labs  Lab 08/23/24 1110 08/23/24 1631 08/23/24 2108 08/24/24 0749 08/24/24 1132  GLUCAP 152* 122* 119* 102* 143*   D-Dimer: No results for input(s): DDIMER in the last 72 hours. Hgb A1c: No results for input(s): HGBA1C in the last 72 hours. Lipid Profile: No results for input(s): CHOL, HDL, LDLCALC, TRIG, CHOLHDL, LDLDIRECT in the last 72 hours. Thyroid  function studies: No results for input(s): TSH, T4TOTAL, T3FREE, THYROIDAB in the last 72 hours.  Invalid input(s): FREET3 Anemia work up: No results for input(s): VITAMINB12, FOLATE, FERRITIN, TIBC, IRON , RETICCTPCT in the last 72 hours. Sepsis Labs: Recent Labs  Lab 08/19/24 0328 08/21/24 0341 08/22/24 1439 08/23/24 0856 08/24/24 0400  PROCALCITON  --   --  0.93  --   --    WBC 8.0 8.3  --  9.5 7.6   Microbiology Recent Results (from the past 240 hours)  Resp panel by RT-PCR (RSV, Flu A&B, Covid) Anterior Nasal Swab     Status: None   Collection Time: 08/21/24  8:20 AM   Specimen: Anterior Nasal Swab  Result Value Ref Range Status   SARS Coronavirus 2 by RT PCR NEGATIVE NEGATIVE Final    Comment: (NOTE) SARS-CoV-2 target nucleic acids are NOT DETECTED.  The SARS-CoV-2 RNA is generally detectable in upper respiratory specimens during the acute phase of infection. The lowest concentration of SARS-CoV-2 viral copies this assay can detect is 138 copies/mL. A negative result does not preclude SARS-Cov-2 infection and should not be used as the sole basis for treatment or other patient management decisions. A negative result may occur with  improper specimen collection/handling, submission of specimen other than nasopharyngeal swab, presence of viral mutation(s) within the areas targeted by this assay, and inadequate number of viral copies(<138 copies/mL). A negative result must be combined with clinical observations, patient history, and epidemiological information. The expected result is Negative.  Fact Sheet for Patients:  bloggercourse.com  Fact Sheet for Healthcare Providers:  seriousbroker.it  This test is no t yet approved or cleared by the [place]  FDA and  has been authorized for detection and/or diagnosis of SARS-CoV-2 by FDA under an Emergency Use Authorization (EUA). This EUA will remain  in effect (meaning this test can be used) for the duration of the COVID-19 declaration under Section 564(b)(1) of the Act, 21 U.S.C.section 360bbb-3(b)(1), unless the authorization is terminated  or revoked sooner.       Influenza A by PCR NEGATIVE NEGATIVE Final   Influenza B by PCR NEGATIVE NEGATIVE Final    Comment: (NOTE) The Xpert Xpress SARS-CoV-2/FLU/RSV plus assay is intended as an aid in  the diagnosis of influenza from Nasopharyngeal swab specimens and should not be used as a sole basis for treatment. Nasal washings and aspirates are unacceptable for Xpert Xpress SARS-CoV-2/FLU/RSV testing.  Fact Sheet for Patients: bloggercourse.com  Fact Sheet for Healthcare Providers: seriousbroker.it  This test is not yet approved or cleared by the [place]  FDA and has been authorized for detection and/or diagnosis of SARS-CoV-2 by FDA under an Emergency Use Authorization (EUA). This EUA will remain in effect (meaning this test can be used) for the duration of the COVID-19 declaration under Section 564(b)(1) of the Act, 21 U.S.C. section 360bbb-3(b)(1),  unless the authorization is terminated or revoked.     Resp Syncytial Virus by PCR NEGATIVE NEGATIVE Final    Comment: (NOTE) Fact Sheet for Patients: bloggercourse.com  Fact Sheet for Healthcare Providers: seriousbroker.it  This test is not yet approved or cleared by the [place]  FDA and has been authorized for detection and/or diagnosis of SARS-CoV-2 by FDA under an Emergency Use Authorization (EUA). This EUA will remain in effect (meaning this test can be used) for the duration of the COVID-19 declaration under Section 564(b)(1) of the Act, 21 U.S.C. section 360bbb-3(b)(1), unless the authorization is terminated or revoked.  Performed at [facility], [address] [place], [place] [address]   MRSA Next Gen by PCR, Nasal     Status: None   Collection Time: 08/22/24  3:47 PM   Specimen: Nasal Mucosa; Nasal Swab  Result Value Ref Range Status   MRSA by PCR Next Gen NOT DETECTED NOT DETECTED Final    Comment: (NOTE) The GeneXpert MRSA Assay (FDA approved for NASAL specimens only), is one component of a comprehensive MRSA colonization surveillance program. It is not intended to diagnose MRSA  infection nor to guide or monitor treatment for MRSA infections. Test performance is not FDA approved in patients less than 57 years old. Performed at [facility], [address] [place], [place] [address]   Body fluid culture w Gram Stain     Status: None (Preliminary result)   Collection Time: 08/23/24  2:57 PM   Specimen: Lung, Left; Pleural Fluid  Result Value Ref Range Status   Specimen Description   Final    PLEURAL Performed at [facility], [address], [place], [place] [address]    Special Requests   Final    LUNG,LEFT Performed at [facility], [address], [place], [place] [address]    Gram Stain   Final    RARE WBC PRESENT, PREDOMINANTLY PMN NO ORGANISMS SEEN    Culture   Final    NO GROWTH < 24 HOURS Performed at [facility], [address] [place], [place] [address]    Report Status PENDING  Incomplete     Medications:    budesonide -glycopyrrolate -formoterol   2 puff Inhalation BID   ferrous sulfate   325 mg Oral Q breakfast   insulin  aspart  0-5 Units Subcutaneous QHS   insulin  aspart  0-6 Units Subcutaneous TID WC   levothyroxine   175 mcg Oral Once per day on Monday Tuesday Wednesday Thursday   pantoprazole   40 mg Oral Daily   polyethylene glycol  17 g Oral BID   Ensure Max Protein  11 oz Oral BID BM   Continuous Infusions:    LOS: 21 days   Erle Odell Castor  [facility]  08/24/2024, 12:21 PM

## 2024-08-24 NOTE — Plan of Care (Signed)
" °  Problem: Health Behavior/Discharge Planning: Goal: Ability to manage health-related needs will improve Outcome: Progressing   Problem: Clinical Measurements: Goal: Ability to maintain clinical measurements within normal limits will improve Outcome: Progressing Goal: Will remain free from infection Outcome: Progressing Goal: Diagnostic test results will improve Outcome: Progressing Goal: Respiratory complications will improve Outcome: Progressing Goal: Cardiovascular complication will be avoided Outcome: Progressing   Problem: Activity: Goal: Risk for activity intolerance will decrease Outcome: Adequate for Discharge   Problem: Nutrition: Goal: Adequate nutrition will be maintained Outcome: Progressing   Problem: Coping: Goal: Level of anxiety will decrease Outcome: Progressing   Problem: Pain Managment: Goal: General experience of comfort will improve and/or be controlled Outcome: Progressing   Problem: Safety: Goal: Ability to remain free from injury will improve Outcome: Progressing   Problem: Skin Integrity: Goal: Risk for impaired skin integrity will decrease Outcome: Progressing   "

## 2024-08-25 DIAGNOSIS — N179 Acute kidney failure, unspecified: Secondary | ICD-10-CM | POA: Diagnosis not present

## 2024-08-25 DIAGNOSIS — D649 Anemia, unspecified: Secondary | ICD-10-CM | POA: Diagnosis not present

## 2024-08-25 DIAGNOSIS — N1832 Chronic kidney disease, stage 3b: Secondary | ICD-10-CM | POA: Diagnosis not present

## 2024-08-25 LAB — GLUCOSE, CAPILLARY
Glucose-Capillary: 108 mg/dL — ABNORMAL HIGH (ref 70–99)
Glucose-Capillary: 155 mg/dL — ABNORMAL HIGH (ref 70–99)
Glucose-Capillary: 161 mg/dL — ABNORMAL HIGH (ref 70–99)
Glucose-Capillary: 129 mg/dL — ABNORMAL HIGH (ref 70–99)

## 2024-08-25 LAB — CBC
HCT: 23.2 % — ABNORMAL LOW (ref 36.0–46.0)
HCT: 29.3 % — ABNORMAL LOW (ref 36.0–46.0)
Hemoglobin: 7 g/dL — ABNORMAL LOW (ref 12.0–15.0)
Hemoglobin: 9.1 g/dL — ABNORMAL LOW (ref 12.0–15.0)
MCH: 27.1 pg (ref 26.0–34.0)
MCH: 28 pg (ref 26.0–34.0)
MCHC: 30.2 g/dL (ref 30.0–36.0)
MCHC: 31.1 g/dL (ref 30.0–36.0)
MCV: 89.9 fL (ref 80.0–100.0)
MCV: 90.2 fL (ref 80.0–100.0)
Platelets: 328 K/uL (ref 150–400)
Platelets: 339 K/uL (ref 150–400)
RBC: 2.58 MIL/uL — ABNORMAL LOW (ref 3.87–5.11)
RBC: 3.25 MIL/uL — ABNORMAL LOW (ref 3.87–5.11)
RDW: 17.2 % — ABNORMAL HIGH (ref 11.5–15.5)
RDW: 16.2 % — ABNORMAL HIGH (ref 11.5–15.5)
WBC: 8.6 K/uL (ref 4.0–10.5)
WBC: 11 K/uL — ABNORMAL HIGH (ref 4.0–10.5)
nRBC: 0 % (ref 0.0–0.2)
nRBC: 0 % (ref 0.0–0.2)

## 2024-08-25 LAB — PREPARE RBC (CROSSMATCH)

## 2024-08-25 MED ORDER — SODIUM CHLORIDE 0.9% IV SOLUTION: Freq: Once | INTRAVENOUS | Status: AC

## 2024-08-25 NOTE — Progress Notes (Signed)
 Mobility Specialist Progress Note:   08/25/24 1356  Mobility  Activity  (Bed Exercises)  Level of Assistance Independent  Range of Motion/Exercises Active;All extremities  Activity Response Tolerated fair  Mobility Referral Yes  Mobility visit 1 Mobility  Mobility Specialist Start Time (ACUTE ONLY) 1215  Mobility Specialist Stop Time (ACUTE ONLY) 1225  Mobility Specialist Time Calculation (min) (ACUTE ONLY) 10 min   Pt was received in bed and agreed to bed exercises: Seated BLE Exercises:  1) Toe Raise/ Heel Raise: 1 x 5   3) Marching: 1 x 5 (right lower extremity)  4) Ankle Pumps: 1 x 5 Pt stated pain in left lower extremity during session. Returned to bed with all needs met. Call bell in reach.  [employer] - Mobility Specialist

## 2024-08-25 NOTE — Progress Notes (Signed)
 [facility] PROGRESS NOTE    Progress Note  Abigail [name]  FMW:[id number] DOB: 12-16-1935 DOA: 08/03/2024 PCP: [name], FNP     Brief Narrative:   Abigail [name] is an 89 y.o. female past medical history of metastatic non-small cell lung cancer in 2022 with a solitary brain mass found in November 2025, chronic respiratory failure with hypoxia on 3 L of oxygen , essential hypertension presented to the ED on 08/03/2024 for generalized weakness resulting in a fall that has progressed since she started radiation therapy decreased oral intake and urinary frequency along with abdominal pain.  In the ED was found to be in acute kidney injury, hemoglobin of 6 point CT scan of the abdomen and pelvis showed severe left hydronephrosis, urinalysis was compatible with urinary tract infection urology was consulted, however they did not feel stent was needed due to clinical improvement of her renal function.  CT scan of the abdomen pelvis was repeated and showed diffuse diverticular disease of the colon with diverticulitis and fluid collection involving the left side pelvic wall with a 6 cm contained perforation or abscess.  General surgery was consulted and recommended 2 weeks of antibiotics which she completed on 08/17/2024.  Due to developing fevers of 101.5 on 08/21/2024 chest x-ray was done that revealed a left-sided pleural effusion CT of the chest showed interval increase of moderate size left pleural effusion with possible loculation.  Status post thoracocentesis on 08/24/2024. Awaiting insurance authorization    Assessment/Plan:   Left-sided pleural effusion/with a history of non-small cell cancer with a solitary brain mets: Completed her chemotherapy and immunotherapy about a year ago with Dr. Gatha. Currently undergoing stereotactic radiosurgery for brain mets. A CT scan on 08/21/2024 showed moderate pleural effusion concerned about malignant pleural effusion. Due to her advanced and  complicated disease palliative care was consulted and she remains a full code. Status post thoracocentesis on 08/24/2024 yielded 700 cc of fluid.. She awaiting insurance approval.  Acute kidney injury on chronic kidney disease stage IIIb/left hydronephrosis: Urology was consulted as there appeared to be hydronephrosis on imaging. As her infection was treated her creatinine improved therefore urology held down on stent placement. Repeated renal ultrasound on 08/13/2024 showed stable left hydronephrosis with improvement in renal function. Voiding trial has been successful. Will need further evaluation as an outpatient by urology.  Acute diverticulitis with abscess: Seen on imaging in 08/04/2024.  Measuring about 6 cm General Surgery was consulted, due to the location is not amenable for drainage. General surgery recommended 2 weeks of antibiotics for which she has completed a course on 08/17/2024. She is currently tolerating her diet.  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 Low suspicious for pneumonia, procalcitonin is low yield. Viral panel is negative.  Bilateral lower extremity DVT: Age indeterminant now transition to oral Eliquis  on 08/12/2024 she is a poor candidate for Lovenox  due to cognitive issues.  Physical deconditioning: PT OT evaluated the patient and she has been denied skilled nursing facility, peer to peer was performed and she still was denied. She will need max home health arrangement. Palliative care has been consulted. Patient lives with son and will need home health at home.  Chronic respiratory failure with hypoxia: At baseline continue 3 L of oxygen .  Acute on chronic normocytic anemia: On admission hemoglobin 6.8 status post 2 units packed red blood cells. Hemoglobin has remained 7.0, will go ahead and transfuse an additional unit of packed red  blood cells. Check CBC  posttransfusion.  Essential hypertension: Toprol  on hold due to borderline blood pressure.  Elevated troponins: Continue aspirin  Lipitor  and Repatha . She denies any chest pain or shortness of breath. Elevation in troponins likely due to demand ischemia.  History of skin cancer: Follow-up with oncology as an outpatient.  History of recurrent UTIs: On chronic Macrobid  therapy as an outpatient.  She is completing her course of antibiotics in house. Will resume Macrobid  as an outpatient.   Sacral decubitus ulcer stage II: RN Pressure Injury Documentation: Wound 08/20/24 2000 Pressure Injury Buttocks Left Stage 2 -  Partial thickness loss of dermis presenting as a shallow open injury with a red, pink wound bed without slough. (Active)    DVT prophylaxis: Eliquis  Family Communication:son Status is: Inpatient Remains inpatient appropriate because: Generalized weakness physical deconditioning    Code Status:     Code Status Orders  (From admission, onward)           Start     Ordered   08/08/24 2020  Full code  (Code Status)  Continuous       Question:  By:  Answer:  Consent: discussion documented in EHR   08/08/24 2020           Code Status History     Date Active Date Inactive Code Status Order ID Comments User Context   08/08/2024 1541 08/08/2024 2020 Do not attempt resuscitation (DNR) - Comfort care [id number]  Jeryl Skeens, MD Inpatient   08/03/2024 1143 08/08/2024 1541 Full Code [id number]  Jillian Buttery, MD ED   06/06/2024 1503 06/10/2024 1740 Full Code [id number]  Sonjia Held, MD ED   01/29/2024 0026 02/04/2024 1730 Full Code [id number]  Lee Kingfisher, MD ED   09/07/2022 0236 09/07/2022 1607 Full Code [id number]  Howerter, Eva NOVAK, DO ED   12/20/2020 0905 12/20/2020 2312 Full Code [id number]  Marilynne Rosaline SAILOR, MD Inpatient   11/09/2020 0601 11/10/2020 2230 Full Code [id number]  Lonzell Emeline HERO, DO ED   12/01/2019 1259 12/02/2019 1710 Full Code [id number]  Barrett,  Shona MATSU, PA-C ED   10/27/2019 0030 10/28/2019 2035 Full Code [id number]  Martie Gatha ORN, MD Inpatient   10/27/2019 0016 10/27/2019 0029 Full Code [id number]  Wonda Sharper, MD Inpatient   10/27/2019 0016 10/27/2019 0016 Full Code [id number]  Wonda Sharper, MD Inpatient   04/05/2012 2048 04/12/2012 1621 Full Code [id number]  Olevia Josette CROME, RN Inpatient         IV Access:   Peripheral IV   Procedures and diagnostic studies:   DG Chest Port 1 View Result Date: 08/23/2024 CLINICAL DATA:  Status post thoracentesis. EXAM: PORTABLE CHEST 1 VIEW COMPARISON:  Chest radiograph dated 08/21/2024. FINDINGS: Decrease in the size of the left pleural effusion. No detectable pneumothorax. The right lung is clear. Stable cardiomegaly. No acute osseous pathology. IMPRESSION: Decrease in the size of the left pleural effusion. No detectable pneumothorax. Electronically Signed   By: Vanetta Chou M.D.   On: 08/23/2024 16:56   IR THORACENTESIS ASP PLEURAL SPACE W/IMG GUIDE Result Date: 08/23/2024 INDICATION: History of metastatic non-small-cell lung cancer. With left pleural effusion. Request for diagnostic and therapeutic left thoracentesis. EXAM: ULTRASOUND GUIDED DIAGNOSTIC AND THERAPEUTIC THORACENTESIS MEDICATIONS: 10 mL 1% lidocaine  COMPLICATIONS: None immediate. PROCEDURE: An ultrasound guided thoracentesis was thoroughly discussed with the patient and questions answered. The benefits, risks, alternatives and complications were also discussed. The patient understands and wishes to proceed with the procedure. Written consent was obtained. Ultrasound was performed to  localize and mark an adequate pocket of fluid in the left chest. The area was then prepped and draped in the normal sterile fashion. 1% Lidocaine  was used for local anesthesia. Under ultrasound guidance a 6 Fr Safe-T-Centesis catheter was introduced. Thoracentesis was performed. The catheter was removed and a dressing applied. FINDINGS: A total of  approximately 700 mL of hazy orange fluid was removed. Samples were sent to the laboratory as requested by the clinical team. IMPRESSION: Successful ultrasound guided left thoracentesis yielding 700 mL of pleural fluid. Performed and dictated by Kimble Clas, PA-C Electronically Signed   By: Ester Sides M.D.   On: 08/23/2024 15:20     Medical Consultants:   None.   Subjective:    [name] pain is not controlled.  Objective:    Vitals:   08/24/24 0636 08/24/24 1343 08/24/24 2304 08/25/24 0427  BP: 139/69 135/85 (!) 111/54 (!) 149/65  Pulse: 87 90 85 86  Resp: 19 18 19 18   Temp: 99.1 F (37.3 C) 98.8 F (37.1 C) 100 F (37.8 C) 98.9 F (37.2 C)  TempSrc: Oral Oral Oral Oral  SpO2: 95% 96% 91% 94%  Weight:      Height:       SpO2: 94 % O2 Flow Rate (L/min): 2 L/min   Intake/Output Summary (Last 24 hours) at 08/25/2024 0838 Last data filed at 08/24/2024 1345 Gross per 24 hour  Intake 220 ml  Output --  Net 220 ml   Filed Weights   08/03/24 0908 08/03/24 1359  Weight: 70.8 kg 71.5 kg    Exam: General exam: In no acute distress. Respiratory system: Good air movement and clear to auscultation. Cardiovascular system: S1 & S2 heard, RRR. No JVD. Gastrointestinal system: Abdomen is nondistended, soft and nontender.  Extremities: No pedal edema. Skin: No rashes, lesions or ulcers Psychiatry: Judgement and insight appear normal. Mood & affect appropriate.  Data Reviewed:    Labs: Basic Metabolic Panel: Recent Labs  Lab 08/19/24 0328 08/21/24 0341 08/23/24 0856 08/24/24 0400  NA 139 135 135 136  K 5.0 4.8 4.6 4.7  CL 107 102 101 105  CO2 25 23 22 24   GLUCOSE 147* 126* 124* 101*  BUN 26* 24* 30* 32*  CREATININE 1.46* 1.56* 1.58* 1.61*  CALCIUM  8.6* 8.5* 8.9 8.1*   GFR Estimated Creatinine Clearance: 25.4 mL/min (A) (by C-G formula based on SCr of 1.61 mg/dL (H)). Liver Function Tests: Recent Labs  Lab 08/23/24 0856 08/24/24 0400  AST 17 17   ALT <5 8  ALKPHOS 125 102  BILITOT 0.4 0.2  PROT 6.5 5.3*  ALBUMIN  2.9* 2.4*   No results for input(s): LIPASE, AMYLASE in the last 168 hours. No results for input(s): AMMONIA in the last 168 hours. Coagulation profile No results for input(s): INR, PROTIME in the last 168 hours. COVID-19 Labs  No results for input(s): DDIMER, FERRITIN, LDH, CRP in the last 72 hours.  Lab Results  Component Value Date   SARSCOV2NAA NEGATIVE 08/21/2024   SARSCOV2NAA NEGATIVE 08/03/2024   SARSCOV2NAA RESULT: NEGATIVE 07/12/2021   SARSCOV2NAA NEGATIVE 12/17/2020    CBC: Recent Labs  Lab 08/19/24 0328 08/21/24 0341 08/23/24 0856 08/24/24 0400 08/25/24 0407  WBC 8.0 8.3 9.5 7.6 8.6  NEUTROABS 6.4 6.4  --   --   --   HGB 7.0* 7.3* 8.3* 7.0* 7.0*  HCT 23.6* 24.5* 27.8* 23.1* 23.2*  MCV 92.9 93.2 92.1 90.9 89.9  PLT 311 308 362 322 328   Cardiac  Enzymes: No results for input(s): CKTOTAL, CKMB, CKMBINDEX, TROPONINI in the last 168 hours. BNP (last 3 results) No results for input(s): PROBNP in the last 8760 hours. CBG: Recent Labs  Lab 08/24/24 0749 08/24/24 1132 08/24/24 1650 08/24/24 2305 08/25/24 0742  GLUCAP 102* 143* 128* 142* 108*   D-Dimer: No results for input(s): DDIMER in the last 72 hours. Hgb A1c: No results for input(s): HGBA1C in the last 72 hours. Lipid Profile: No results for input(s): CHOL, HDL, LDLCALC, TRIG, CHOLHDL, LDLDIRECT in the last 72 hours. Thyroid  function studies: No results for input(s): TSH, T4TOTAL, T3FREE, THYROIDAB in the last 72 hours.  Invalid input(s): FREET3 Anemia work up: No results for input(s): VITAMINB12, FOLATE, FERRITIN, TIBC, IRON , RETICCTPCT in the last 72 hours. Sepsis Labs: Recent Labs  Lab 08/21/24 0341 08/22/24 1439 08/23/24 0856 08/24/24 0400 08/25/24 0407  PROCALCITON  --  0.93  --   --   --   WBC 8.3  --  9.5 7.6 8.6   Microbiology Recent Results  (from the past 240 hours)  Resp panel by RT-PCR (RSV, Flu A&B, Covid) Anterior Nasal Swab     Status: None   Collection Time: 08/21/24  8:20 AM   Specimen: Anterior Nasal Swab  Result Value Ref Range Status   SARS Coronavirus 2 by RT PCR NEGATIVE NEGATIVE Final    Comment: (NOTE) SARS-CoV-2 target nucleic acids are NOT DETECTED.  The SARS-CoV-2 RNA is generally detectable in upper respiratory specimens during the acute phase of infection. The lowest concentration of SARS-CoV-2 viral copies this assay can detect is 138 copies/mL. A negative result does not preclude SARS-Cov-2 infection and should not be used as the sole basis for treatment or other patient management decisions. A negative result may occur with  improper specimen collection/handling, submission of specimen other than nasopharyngeal swab, presence of viral mutation(s) within the areas targeted by this assay, and inadequate number of viral copies(<138 copies/mL). A negative result must be combined with clinical observations, patient history, and epidemiological information. The expected result is Negative.  Fact Sheet for Patients:  bloggercourse.com  Fact Sheet for Healthcare Providers:  seriousbroker.it  This test is no t yet approved or cleared by the [place]  FDA and  has been authorized for detection and/or diagnosis of SARS-CoV-2 by FDA under an Emergency Use Authorization (EUA). This EUA will remain  in effect (meaning this test can be used) for the duration of the COVID-19 declaration under Section 564(b)(1) of the Act, 21 U.S.C.section 360bbb-3(b)(1), unless the authorization is terminated  or revoked sooner.       Influenza A by PCR NEGATIVE NEGATIVE Final   Influenza B by PCR NEGATIVE NEGATIVE Final    Comment: (NOTE) The Xpert Xpress SARS-CoV-2/FLU/RSV plus assay is intended as an aid in the diagnosis of influenza from Nasopharyngeal swab  specimens and should not be used as a sole basis for treatment. Nasal washings and aspirates are unacceptable for Xpert Xpress SARS-CoV-2/FLU/RSV testing.  Fact Sheet for Patients: bloggercourse.com  Fact Sheet for Healthcare Providers: seriousbroker.it  This test is not yet approved or cleared by the [place]  FDA and has been authorized for detection and/or diagnosis of SARS-CoV-2 by FDA under an Emergency Use Authorization (EUA). This EUA will remain in effect (meaning this test can be used) for the duration of the COVID-19 declaration under Section 564(b)(1) of the Act, 21 U.S.C. section 360bbb-3(b)(1), unless the authorization is terminated or revoked.     Resp Syncytial Virus by PCR NEGATIVE NEGATIVE  Final    Comment: (NOTE) Fact Sheet for Patients: bloggercourse.com  Fact Sheet for Healthcare Providers: seriousbroker.it  This test is not yet approved or cleared by the [place]  FDA and has been authorized for detection and/or diagnosis of SARS-CoV-2 by FDA under an Emergency Use Authorization (EUA). This EUA will remain in effect (meaning this test can be used) for the duration of the COVID-19 declaration under Section 564(b)(1) of the Act, 21 U.S.C. section 360bbb-3(b)(1), unless the authorization is terminated or revoked.  Performed at [facility], [address] [place], [place] [address]   MRSA Next Gen by PCR, Nasal     Status: None   Collection Time: 08/22/24  3:47 PM   Specimen: Nasal Mucosa; Nasal Swab  Result Value Ref Range Status   MRSA by PCR Next Gen NOT DETECTED NOT DETECTED Final    Comment: (NOTE) The GeneXpert MRSA Assay (FDA approved for NASAL specimens only), is one component of a comprehensive MRSA colonization surveillance program. It is not intended to diagnose MRSA infection nor to guide or monitor treatment for  MRSA infections. Test performance is not FDA approved in patients less than 12 years old. Performed at [facility], [address], [place], [place] [address]   Body fluid culture w Gram Stain     Status: None (Preliminary result)   Collection Time: 08/23/24  2:57 PM   Specimen: Lung, Left; Pleural Fluid  Result Value Ref Range Status   Specimen Description   Final    PLEURAL Performed at [facility], [address] [place], [place] [address]    Special Requests   Final    LUNG,LEFT Performed at [facility], [address] [place], [place] [address]    Gram Stain   Final    RARE WBC PRESENT, PREDOMINANTLY PMN NO ORGANISMS SEEN    Culture   Final    NO GROWTH < 24 HOURS Performed at [facility], [address], [place], [place] [address]    Report Status PENDING  Incomplete     Medications:    apixaban   5 mg Oral BID   budesonide -glycopyrrolate -formoterol   2 puff Inhalation BID   ferrous sulfate   325 mg Oral Q breakfast   insulin  aspart  0-5 Units Subcutaneous QHS   insulin  aspart  0-6 Units Subcutaneous TID WC   levothyroxine   175 mcg Oral Once per day on Monday Tuesday Wednesday Thursday   pantoprazole   40 mg Oral Daily   polyethylene glycol  17 g Oral BID   Ensure Max Protein  11 oz Oral BID BM   Continuous Infusions:    LOS: 22 days   Erle Odell Castor  [facility]  08/25/2024, 8:38 AM

## 2024-08-26 ENCOUNTER — Telehealth (HOSPITAL_COMMUNITY): Payer: Self-pay | Admitting: Pharmacy Technician

## 2024-08-26 ENCOUNTER — Other Ambulatory Visit (HOSPITAL_COMMUNITY): Payer: Self-pay

## 2024-08-26 DIAGNOSIS — N179 Acute kidney failure, unspecified: Secondary | ICD-10-CM | POA: Diagnosis not present

## 2024-08-26 DIAGNOSIS — N1832 Chronic kidney disease, stage 3b: Secondary | ICD-10-CM | POA: Diagnosis not present

## 2024-08-26 LAB — BPAM RBC
Blood Product Expiration Date: 202601172359
ISSUE DATE / TIME: 202601011401
Unit Type and Rh: 1700

## 2024-08-26 LAB — GLUCOSE, CAPILLARY
Glucose-Capillary: 110 mg/dL — ABNORMAL HIGH (ref 70–99)
Glucose-Capillary: 133 mg/dL — ABNORMAL HIGH (ref 70–99)
Glucose-Capillary: 144 mg/dL — ABNORMAL HIGH (ref 70–99)
Glucose-Capillary: 117 mg/dL — ABNORMAL HIGH (ref 70–99)

## 2024-08-26 LAB — TYPE AND SCREEN
ABO/RH(D): B POS
Antibody Screen: NEGATIVE
Unit division: 0

## 2024-08-26 LAB — GLUCOSE, BODY FLUID OTHER: Glucose, Body Fluid Other: 141 mg/dL

## 2024-08-26 LAB — CBC
HCT: 29.3 % — ABNORMAL LOW (ref 36.0–46.0)
Hemoglobin: 9.1 g/dL — ABNORMAL LOW (ref 12.0–15.0)
MCH: 28 pg (ref 26.0–34.0)
MCHC: 31.1 g/dL (ref 30.0–36.0)
MCV: 90.2 fL (ref 80.0–100.0)
Platelets: 315 K/uL (ref 150–400)
RBC: 3.25 MIL/uL — ABNORMAL LOW (ref 3.87–5.11)
RDW: 16.5 % — ABNORMAL HIGH (ref 11.5–15.5)
WBC: 10.8 K/uL — ABNORMAL HIGH (ref 4.0–10.5)
nRBC: 0 % (ref 0.0–0.2)

## 2024-08-26 NOTE — Plan of Care (Signed)
" °  Problem: Health Behavior/Discharge Planning: Goal: Ability to manage health-related needs will improve Outcome: Progressing   Problem: Clinical Measurements: Goal: Will remain free from infection Outcome: Progressing Goal: Diagnostic test results will improve Outcome: Progressing Goal: Respiratory complications will improve Outcome: Progressing Goal: Cardiovascular complication will be avoided Outcome: Progressing   Problem: Nutrition: Goal: Adequate nutrition will be maintained Outcome: Progressing   Problem: Coping: Goal: Level of anxiety will decrease Outcome: Progressing   Problem: Pain Managment: Goal: General experience of comfort will improve and/or be controlled Outcome: Progressing   "

## 2024-08-26 NOTE — Progress Notes (Signed)
 [facility] PROGRESS NOTE    Progress Note  Abigail DICOLA  FMW:[id number] DOB: 31-May-1936 DOA: 08/03/2024 PCP: [name], FNP     Brief Narrative:   Abigail [name] is an 89 y.o. female past medical history of metastatic non-small cell lung cancer in 2022 with a solitary brain mass found in November 2025, chronic respiratory failure with hypoxia on 3 L of oxygen , essential hypertension presented to the ED on 08/03/2024 for generalized weakness resulting in a fall that has progressed since she started radiation therapy decreased oral intake and urinary frequency along with abdominal pain.  In the ED was found to be in acute kidney injury, hemoglobin of 6 point CT scan of the abdomen and pelvis showed severe left hydronephrosis, urinalysis was compatible with urinary tract infection urology was consulted, however they did not feel stent was needed due to clinical improvement of her renal function.  CT scan of the abdomen pelvis was repeated and showed diffuse diverticular disease of the colon with diverticulitis and fluid collection involving the left side pelvic wall with a 6 cm contained perforation or abscess.  General surgery was consulted and recommended 2 weeks of antibiotics which she completed on 08/17/2024.  Due to developing fevers of 101.5 on 08/21/2024 chest x-ray was done that revealed a left-sided pleural effusion CT of the chest showed interval increase of moderate size left pleural effusion with possible loculation.  Status post thoracocentesis on 08/24/2024. Awaiting insurance authorization.   Assessment/Plan:   Left-sided pleural effusion/with a history of non-small cell cancer with a solitary brain mets: Completed her chemotherapy and immunotherapy about a year ago with Dr. Gatha. Currently undergoing stereotactic radiosurgery for brain mets. A CT scan on 08/21/2024 showed moderate pleural effusion concerned about malignant pleural effusion. Due to her advanced and  complicated disease palliative care was consulted and she remains a full code. Status post thoracocentesis on 08/24/2024 yielded 700 cc of fluid.. She awaiting insurance approval.  Acute kidney injury on chronic kidney disease stage IIIb/left hydronephrosis: Urology was consulted as there appeared to be hydronephrosis on imaging. As her infection was treated her creatinine improved therefore urology held down on stent placement. Repeated renal ultrasound on 08/13/2024 showed stable left hydronephrosis with improvement in renal function. Voiding trial has been successful. Will need further evaluation as an outpatient by urology.  Acute diverticulitis with abscess: Seen on imaging in 08/04/2024.  Measuring about 6 cm General Surgery was consulted, due to the location is not amenable for drainage. General surgery recommended 2 weeks of antibiotics for which she has completed a course on 08/17/2024. She is currently tolerating her diet.  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 Low suspicious for pneumonia, procalcitonin is low yield. Viral panel is negative.  Bilateral lower extremity DVT: Age indeterminant now transition to oral Eliquis  on 08/12/2024 she is a poor candidate for Lovenox  due to cognitive issues.  Physical deconditioning: PT OT evaluated the patient and she has been denied skilled nursing facility, peer to peer was performed and she still was denied. She will need max home health arrangement. Palliative care has been consulted. Patient lives with son and will need home health at home.  Chronic respiratory failure with hypoxia: At baseline continue 3 L of oxygen .  Acute on chronic normocytic anemia: On admission hemoglobin 6.8 status post 2 units packed red blood cells. Hemoglobin has remained 7.0, will go ahead and transfuse an additional unit of packed red blood  cells. Check CBC  posttransfusion.  Essential hypertension: Toprol  on hold due to borderline blood pressure.  Elevated troponins: Continue aspirin  Lipitor  and Repatha . She denies any chest pain or shortness of breath. Elevation in troponins likely due to demand ischemia.  History of skin cancer: Follow-up with oncology as an outpatient.  History of recurrent UTIs: On chronic Macrobid  therapy as an outpatient.  She is completing her course of antibiotics in house. Will resume Macrobid  as an outpatient.   Sacral decubitus ulcer stage II: RN Pressure Injury Documentation: Wound 08/20/24 2000 Pressure Injury Buttocks Left Stage 2 -  Partial thickness loss of dermis presenting as a shallow open injury with a red, pink wound bed without slough. (Active)    DVT prophylaxis: Eliquis  Family Communication:son Status is: Inpatient Remains inpatient appropriate because: Generalized weakness physical deconditioning    Code Status:     Code Status Orders  (From admission, onward)           Start     Ordered   08/08/24 2020  Full code  (Code Status)  Continuous       Question:  By:  Answer:  Consent: discussion documented in EHR   08/08/24 2020           Code Status History     Date Active Date Inactive Code Status Order ID Comments User Context   08/08/2024 1541 08/08/2024 2020 Do not attempt resuscitation (DNR) - Comfort care [id number]  Jeryl Skeens, MD Inpatient   08/03/2024 1143 08/08/2024 1541 Full Code [id number]  Jillian Buttery, MD ED   06/06/2024 1503 06/10/2024 1740 Full Code [id number]  Sonjia Held, MD ED   01/29/2024 0026 02/04/2024 1730 Full Code [id number]  Lee Kingfisher, MD ED   09/07/2022 0236 09/07/2022 1607 Full Code [id number]  Howerter, Eva NOVAK, DO ED   12/20/2020 0905 12/20/2020 2312 Full Code [id number]  Marilynne Rosaline SAILOR, MD Inpatient   11/09/2020 0601 11/10/2020 2230 Full Code [id number]  Lonzell Emeline HERO, DO ED   12/01/2019 1259 12/02/2019 1710 Full Code [id number]  Barrett,  Shona MATSU, PA-C ED   10/27/2019 0030 10/28/2019 2035 Full Code [id number]  Martie Gatha ORN, MD Inpatient   10/27/2019 0016 10/27/2019 0029 Full Code [id number]  Wonda Sharper, MD Inpatient   10/27/2019 0016 10/27/2019 0016 Full Code [id number]  Wonda Sharper, MD Inpatient   04/05/2012 2048 04/12/2012 1621 Full Code [id number]  Olevia Josette CROME, RN Inpatient         IV Access:   Peripheral IV   Procedures and diagnostic studies:   No results found.    Medical Consultants:   None.   Subjective:    [name] pain is not controlled.  Objective:    Vitals:   08/25/24 1450 08/25/24 1812 08/25/24 2110 08/26/24 0612  BP: 123/84 130/61 (!) 104/46 (!) 127/54  Pulse: 84 88 80 81  Resp: (!) 24 18 17 17   Temp: 97.9 F (36.6 C) 100 F (37.8 C) 99 F (37.2 C) 98.4 F (36.9 C)  TempSrc: Oral Oral Oral   SpO2: 94% 95% 93% 93%  Weight:      Height:       SpO2: 93 % O2 Flow Rate (L/min): 3 L/min   Intake/Output Summary (Last 24 hours) at 08/26/2024 0934 Last data filed at 08/25/2024 1800 Gross per 24 hour  Intake 718 ml  Output --  Net 718 ml   Filed Weights   08/03/24 0908 08/03/24 1359  Weight: 70.8 kg 71.5 kg  Exam: General exam: In no acute distress. Respiratory system: Good air movement and clear to auscultation. Cardiovascular system: S1 & S2 heard, RRR. No JVD. Gastrointestinal system: Abdomen is nondistended, soft and nontender.  Extremities: No pedal edema. Skin: No rashes, lesions or ulcers Psychiatry: Judgement and insight appear normal. Mood & affect appropriate.  Data Reviewed:    Labs: Basic Metabolic Panel: Recent Labs  Lab 08/21/24 0341 08/23/24 0856 08/24/24 0400  NA 135 135 136  K 4.8 4.6 4.7  CL 102 101 105  CO2 23 22 24   GLUCOSE 126* 124* 101*  BUN 24* 30* 32*  CREATININE 1.56* 1.58* 1.61*  CALCIUM  8.5* 8.9 8.1*   GFR Estimated Creatinine Clearance: 25.4 mL/min (A) (by C-G formula based on SCr of 1.61 mg/dL (H)). Liver Function  Tests: Recent Labs  Lab 08/23/24 0856 08/24/24 0400  AST 17 17  ALT <5 8  ALKPHOS 125 102  BILITOT 0.4 0.2  PROT 6.5 5.3*  ALBUMIN  2.9* 2.4*   No results for input(s): LIPASE, AMYLASE in the last 168 hours. No results for input(s): AMMONIA in the last 168 hours. Coagulation profile No results for input(s): INR, PROTIME in the last 168 hours. COVID-19 Labs  No results for input(s): DDIMER, FERRITIN, LDH, CRP in the last 72 hours.  Lab Results  Component Value Date   SARSCOV2NAA NEGATIVE 08/21/2024   SARSCOV2NAA NEGATIVE 08/03/2024   SARSCOV2NAA RESULT: NEGATIVE 07/12/2021   SARSCOV2NAA NEGATIVE 12/17/2020    CBC: Recent Labs  Lab 08/21/24 0341 08/23/24 0856 08/24/24 0400 08/25/24 0407 08/25/24 1925 08/26/24 0318  WBC 8.3 9.5 7.6 8.6 11.0* 10.8*  NEUTROABS 6.4  --   --   --   --   --   HGB 7.3* 8.3* 7.0* 7.0* 9.1* 9.1*  HCT 24.5* 27.8* 23.1* 23.2* 29.3* 29.3*  MCV 93.2 92.1 90.9 89.9 90.2 90.2  PLT 308 362 322 328 339 315   Cardiac Enzymes: No results for input(s): CKTOTAL, CKMB, CKMBINDEX, TROPONINI in the last 168 hours. BNP (last 3 results) No results for input(s): PROBNP in the last 8760 hours. CBG: Recent Labs  Lab 08/25/24 0742 08/25/24 1136 08/25/24 1655 08/25/24 2112 08/26/24 0805  GLUCAP 108* 155* 161* 129* 110*   D-Dimer: No results for input(s): DDIMER in the last 72 hours. Hgb A1c: No results for input(s): HGBA1C in the last 72 hours. Lipid Profile: No results for input(s): CHOL, HDL, LDLCALC, TRIG, CHOLHDL, LDLDIRECT in the last 72 hours. Thyroid  function studies: No results for input(s): TSH, T4TOTAL, T3FREE, THYROIDAB in the last 72 hours.  Invalid input(s): FREET3 Anemia work up: No results for input(s): VITAMINB12, FOLATE, FERRITIN, TIBC, IRON , RETICCTPCT in the last 72 hours. Sepsis Labs: Recent Labs  Lab 08/22/24 1439 08/23/24 0856 08/24/24 0400 08/25/24 0407  08/25/24 1925 08/26/24 0318  PROCALCITON 0.93  --   --   --   --   --   WBC  --    < > 7.6 8.6 11.0* 10.8*   < > = values in this interval not displayed.   Microbiology Recent Results (from the past 240 hours)  Resp panel by RT-PCR (RSV, Flu A&B, Covid) Anterior Nasal Swab     Status: None   Collection Time: 08/21/24  8:20 AM   Specimen: Anterior Nasal Swab  Result Value Ref Range Status   SARS Coronavirus 2 by RT PCR NEGATIVE NEGATIVE Final    Comment: (NOTE) SARS-CoV-2 target nucleic acids are NOT DETECTED.  The SARS-CoV-2 RNA is generally detectable in  upper respiratory specimens during the acute phase of infection. The lowest concentration of SARS-CoV-2 viral copies this assay can detect is 138 copies/mL. A negative result does not preclude SARS-Cov-2 infection and should not be used as the sole basis for treatment or other patient management decisions. A negative result may occur with  improper specimen collection/handling, submission of specimen other than nasopharyngeal swab, presence of viral mutation(s) within the areas targeted by this assay, and inadequate number of viral copies(<138 copies/mL). A negative result must be combined with clinical observations, patient history, and epidemiological information. The expected result is Negative.  Fact Sheet for Patients:  bloggercourse.com  Fact Sheet for Healthcare Providers:  seriousbroker.it  This test is no t yet approved or cleared by the [place]  FDA and  has been authorized for detection and/or diagnosis of SARS-CoV-2 by FDA under an Emergency Use Authorization (EUA). This EUA will remain  in effect (meaning this test can be used) for the duration of the COVID-19 declaration under Section 564(b)(1) of the Act, 21 U.S.C.section 360bbb-3(b)(1), unless the authorization is terminated  or revoked sooner.       Influenza A by PCR NEGATIVE NEGATIVE Final    Influenza B by PCR NEGATIVE NEGATIVE Final    Comment: (NOTE) The Xpert Xpress SARS-CoV-2/FLU/RSV plus assay is intended as an aid in the diagnosis of influenza from Nasopharyngeal swab specimens and should not be used as a sole basis for treatment. Nasal washings and aspirates are unacceptable for Xpert Xpress SARS-CoV-2/FLU/RSV testing.  Fact Sheet for Patients: bloggercourse.com  Fact Sheet for Healthcare Providers: seriousbroker.it  This test is not yet approved or cleared by the [place]  FDA and has been authorized for detection and/or diagnosis of SARS-CoV-2 by FDA under an Emergency Use Authorization (EUA). This EUA will remain in effect (meaning this test can be used) for the duration of the COVID-19 declaration under Section 564(b)(1) of the Act, 21 U.S.C. section 360bbb-3(b)(1), unless the authorization is terminated or revoked.     Resp Syncytial Virus by PCR NEGATIVE NEGATIVE Final    Comment: (NOTE) Fact Sheet for Patients: bloggercourse.com  Fact Sheet for Healthcare Providers: seriousbroker.it  This test is not yet approved or cleared by the [place]  FDA and has been authorized for detection and/or diagnosis of SARS-CoV-2 by FDA under an Emergency Use Authorization (EUA). This EUA will remain in effect (meaning this test can be used) for the duration of the COVID-19 declaration under Section 564(b)(1) of the Act, 21 U.S.C. section 360bbb-3(b)(1), unless the authorization is terminated or revoked.  Performed at [facility] [address], [place], [place] [address]   MRSA Next Gen by PCR, Nasal     Status: None   Collection Time: 08/22/24  3:47 PM   Specimen: Nasal Mucosa; Nasal Swab  Result Value Ref Range Status   MRSA by PCR Next Gen NOT DETECTED NOT DETECTED Final    Comment: (NOTE) The GeneXpert MRSA Assay (FDA approved for  NASAL specimens only), is one component of a comprehensive MRSA colonization surveillance program. It is not intended to diagnose MRSA infection nor to guide or monitor treatment for MRSA infections. Test performance is not FDA approved in patients less than 16 years old. Performed at [facility], [address], [place], [place] [address]   Body fluid culture w Gram Stain     Status: None (Preliminary result)   Collection Time: 08/23/24  2:57 PM   Specimen: Lung, Left; Pleural Fluid  Result Value  Ref Range Status   Specimen Description   Final    PLEURAL Performed at [facility], [address], [place], [place] [address]    Special Requests   Final    LUNG,LEFT Performed at [facility], [address] [place], [place] [address]    Gram Stain   Final    RARE WBC PRESENT, PREDOMINANTLY PMN NO ORGANISMS SEEN    Culture   Final    NO GROWTH 3 DAYS Performed at [facility], [address], [place], [place] [address]    Report Status PENDING  Incomplete     Medications:    apixaban   5 mg Oral BID   budesonide -glycopyrrolate -formoterol   2 puff Inhalation BID   ferrous sulfate   325 mg Oral Q breakfast   insulin  aspart  0-5 Units Subcutaneous QHS   insulin  aspart  0-6 Units Subcutaneous TID WC   levothyroxine   175 mcg Oral Once per day on Monday Tuesday Wednesday Thursday   pantoprazole   40 mg Oral Daily   polyethylene glycol  17 g Oral BID   Ensure Max Protein  11 oz Oral BID BM   Continuous Infusions:    LOS: 23 days   Erle Odell Castor  [facility]  08/26/2024, 9:34 AM

## 2024-08-26 NOTE — Progress Notes (Signed)
 Occupational Therapy Treatment Patient Details Name: Abigail [name] MRN: [id number] DOB: 11/24/1935 Today's Date: 08/26/2024   History of present illness Pt is an 88y.o female admitted on 08/03/24 with generalized weakness. She fell at home while trying to put her pants on and was unable to get up. PMH includes: metastatic non-small lung cancer to brain, currently on radiation treatment, chronic hypoxic respiratory failure on 3 L O2, CKD stage IIIb, 2 skin cancers on her legs s/p resection, hypertension, coronary artery disease, chronic normocytic anemia, hypothyroidism.   OT comments  Patient seen for skilled OT session this am. Progressing with BADL's and toileting with RW access this session with education on breathing integration, pacing and energy conservation. Light UE and breathing exercises performed. Open to remaining OOB in recliner post session. Patient requires continued Acute care hospital level OT services to progress safety and functional performance and allow for discharge. Patient will benefit from continued inpatient follow up therapy, <3 hours/day.         If plan is discharge home, recommend the following:  Assistance with cooking/housework;Help with stairs or ramp for entrance;Assist for transportation;A lot of help with bathing/dressing/bathroom;A lot of help with walking and/or transfers   Equipment Recommendations  BSC/3in1;Wheelchair (measurements OT);Wheelchair cushion (measurements OT);Hospital bed;Other (comment) (if home and snf still denial)       Precautions / Restrictions Precautions Precautions: Fall Recall of Precautions/Restrictions: Intact Precaution/Restrictions Comments: O2 dep at baseline-3L COPD Restrictions Weight Bearing Restrictions Per Provider Order: No Other Position/Activity Restrictions: WBAT       Mobility Bed Mobility Overal bed mobility: Needs Assistance Bed Mobility: Supine to Sit     Supine to sit: Min assist, HOB elevated, Used  rails     General bed mobility comments: initial assist for L LE mngt and increased time to move to EOB    Transfers Overall transfer level: Needs assistance Equipment used: Rolling walker (2 wheels) Transfers: Sit to/from Stand, Bed to chair/wheelchair/BSC Sit to Stand: Min assist     Step pivot transfers: Min assist, From elevated surface     General transfer comment: improved STS and step pivots with RW from bed to commode then to recliner     Balance Overall balance assessment: Needs assistance Sitting-balance support: Feet supported, Bilateral upper extremity supported Sitting balance-Leahy Scale: Fair     Standing balance support: Bilateral upper extremity supported, During functional activity, Reliant on assistive device for balance Standing balance-Leahy Scale: Fair Standing balance comment: RW support                           ADL either performed or assessed with clinical judgement   ADL Overall ADL's : Needs assistance/impaired Eating/Feeding: Set up;Sitting;Bed level   Grooming: Wash/dry hands;Wash/dry face;Oral care;Brushing hair;Set up;Sitting   Upper Body Bathing: Set up;Sitting   Lower Body Bathing: Minimal assistance;Sit to/from stand   Upper Body Dressing : Contact guard assist;Sitting   Lower Body Dressing: Minimal assistance;Sit to/from stand;Cueing for sequencing   Toilet Transfer: Contact guard assist;Minimal assistance;Cueing for safety;Cueing for sequencing;Stand-pivot;BSC/3in1;Rolling walker (2 wheels)   Toileting- Clothing Manipulation and Hygiene: Minimal assistance;Moderate assistance;Sitting/lateral lean       Functional mobility during ADLs: Contact guard assist;Minimal assistance;Rolling walker (2 wheels) General ADL Comments: decreased functional reach to LE's and overall activity tolerance requiring frequent rest breaks    Extremity/Trunk Assessment Upper Extremity Assessment Upper Extremity Assessment: Generalized  weakness;Right hand dominant   Lower Extremity Assessment Lower Extremity Assessment: Defer to  PT evaluation                 Communication Communication Communication: No apparent difficulties   Cognition Arousal: Alert Behavior During Therapy: WFL for tasks assessed/performed Cognition: Cognition impaired     Awareness: Online awareness impaired Memory impairment (select all impairments): Short-term memory Attention impairment (select first level of impairment): Selective attention Executive functioning impairment (select all impairments): Problem solving OT - Cognition Comments: Oriented to person, place, time and situation. Followed 1 step commands without difficulty mild higher level processing                 Following commands: Intact        Cueing   Cueing Techniques: Verbal cues  Exercises Exercises: Other exercises (foam cube squeezes, pursed lip breahting)       General Comments L LE edema improving but still +2 edema, SpO2 reained 93-95% on 3 ltrs [name]    Pertinent Vitals/ Pain       Pain Assessment Pain Assessment: Faces Faces Pain Scale: Hurts little more Pain Location: abdomen Pain Descriptors / Indicators: Discomfort, Aching Pain Intervention(s): Monitored during session, Limited activity within patient's tolerance, Repositioned   Frequency  Min 2X/week        Progress Toward Goals  OT Goals(current goals can now be found in the care plan section)  Progress towards OT goals: Progressing toward goals;Goals updated  ADL Goals Pt Will Perform Lower Body Bathing: with supervision;with adaptive equipment;sit to/from stand   AM-PAC OT 6 Clicks Daily Activity     Outcome Measure   Help from another person eating meals?: None Help from another person taking care of personal grooming?: A Little Help from another person toileting, which includes using toliet, bedpan, or urinal?: A Lot Help from another person bathing (including washing,  rinsing, drying)?: A Lot Help from another person to put on and taking off regular upper body clothing?: A Little Help from another person to put on and taking off regular lower body clothing?: A Lot 6 Click Score: 16    End of Session Equipment Utilized During Treatment: Gait belt;Rolling walker (2 wheels);Oxygen   OT Visit Diagnosis: Unsteadiness on feet (R26.81);Muscle weakness (generalized) (M62.81);Other abnormalities of gait and mobility (R26.89)   Activity Tolerance Patient tolerated treatment well   Patient Left in chair;with call bell/phone within reach;with chair alarm set   Nurse Communication Mobility status;Other (comment) (BM data entered in Flow sheets)        Time: 1001-1035 OT Time Calculation (min): 34 min  Charges: OT General Charges $OT Visit: 1 Visit OT Treatments $Self Care/Home Management : 8-22 mins $Therapeutic Activity: 8-22 mins  Darwin Rothlisberger OT/L Acute Rehabilitation Department  [phone]  08/26/2024, 1:13 PM

## 2024-08-26 NOTE — Telephone Encounter (Signed)
 Patient Product/process Development Scientist completed.    The patient is insured through [employer]/ [employer]. Patient has Medicare and is not eligible for a copay card, but may be able to apply for patient assistance or Medicare RX Payment Plan (Patient Must reach out to their plan, if eligible for payment plan), if available.    Ran test claim for Eliquis  5 mg and the current 30 day co-pay is $49.73 due to a deductible.   This test claim was processed through [facility]- copay amounts may vary at other pharmacies due to pharmacy/plan contracts, or as the patient moves through the different stages of their insurance plan.     Reyes Sharps, CPHT Pharmacy Technician Patient Advocate Specialist Lead [facility] Patient Advocate Team Direct Number: [phone]  Fax: [phone]

## 2024-08-27 DIAGNOSIS — N1832 Chronic kidney disease, stage 3b: Secondary | ICD-10-CM | POA: Diagnosis not present

## 2024-08-27 DIAGNOSIS — N179 Acute kidney failure, unspecified: Secondary | ICD-10-CM | POA: Diagnosis not present

## 2024-08-27 LAB — GLUCOSE, CAPILLARY
Glucose-Capillary: 117 mg/dL — ABNORMAL HIGH (ref 70–99)
Glucose-Capillary: 167 mg/dL — ABNORMAL HIGH (ref 70–99)
Glucose-Capillary: 181 mg/dL — ABNORMAL HIGH (ref 70–99)
Glucose-Capillary: 114 mg/dL — ABNORMAL HIGH (ref 70–99)

## 2024-08-27 LAB — BODY FLUID CULTURE W GRAM STAIN: Culture: NO GROWTH

## 2024-08-27 MED ORDER — METOPROLOL SUCCINATE ER 50 MG PO TB24
50.0000 mg | ORAL_TABLET | Freq: Every evening | ORAL | Status: DC
  Administered 2024-08-27 – 2024-09-02 (×7): 50 mg via ORAL
  Filled 2024-08-27 (×7): qty 1

## 2024-08-27 NOTE — Plan of Care (Signed)
   Problem: Clinical Measurements: Goal: Ability to maintain clinical measurements within normal limits will improve Outcome: Progressing Goal: Will remain free from infection Outcome: Progressing Goal: Diagnostic test results will improve Outcome: Progressing Goal: Respiratory complications will improve Outcome: Progressing

## 2024-08-27 NOTE — Plan of Care (Signed)
   Problem: Coping: Goal: Level of anxiety will decrease Outcome: Progressing   Problem: Pain Managment: Goal: General experience of comfort will improve and/or be controlled Outcome: Progressing   Problem: Safety: Goal: Ability to remain free from injury will improve Outcome: Progressing

## 2024-08-27 NOTE — Progress Notes (Signed)
 [facility] PROGRESS NOTE    Progress Note  Abigail [name]  FMW:[id number] DOB: [birth year] DOA: 08/03/2024 PCP: [name], FNP     Brief Narrative:   Abigail [name] is an 89 y.o. female past medical history of metastatic non-small cell lung cancer in 2022 with a solitary brain mass found in November 2025, chronic respiratory failure with hypoxia on 3 L of oxygen , essential hypertension presented to the ED on 08/03/2024 for generalized weakness resulting in a fall that has progressed since she started radiation therapy decreased oral intake and urinary frequency along with abdominal pain.  In the ED was found to be in acute kidney injury, hemoglobin of 6 point CT scan of the abdomen and pelvis showed severe left hydronephrosis, urinalysis was compatible with urinary tract infection urology was consulted, however they did not feel stent was needed due to clinical improvement of her renal function.  CT scan of the abdomen pelvis was repeated and showed diffuse diverticular disease of the colon with diverticulitis and fluid collection involving the left side pelvic wall with a 6 cm contained perforation or abscess.  General surgery was consulted and recommended 2 weeks of antibiotics which she completed on 08/17/2024.  Due to developing fevers of 101.5 on 08/21/2024 chest x-ray was done that revealed a left-sided pleural effusion CT of the chest showed interval increase of moderate size left pleural effusion with possible loculation.  Status post thoracocentesis on 08/24/2024. Awaiting insurance authorization.   Assessment/Plan:   Left-sided pleural effusion/with a history of non-small cell cancer with a solitary brain mets: Completed her chemotherapy and immunotherapy about a year ago with Dr. Gatha. Currently undergoing stereotactic radiosurgery for brain mets. A CT scan on 08/21/2024 showed moderate pleural effusion concerned about malignant pleural effusion. Pulmonary consulted. Due to  her advanced disease and complicated disease palliative care was consulted and she remains a full code. Status post thoracocentesis on 08/24/2024 yielded 700 cc of fluid. Likely malignant. She awaiting insurance approval.  Acute kidney injury on chronic kidney disease stage IIIb/left hydronephrosis: Urology was consulted, as there appeared to be hydronephrosis on imaging. As her infection was treated her creatinine improved therefore urology held on stent placement. Repeated renal ultrasound on 08/13/2024 showed stable left hydronephrosis with improvement in renal function. Voiding trial has been successful. Will need further evaluation as an outpatient by urology.  Acute diverticulitis with abscess: Seen on imaging in 08/04/2024.  Measuring about 6 cm General Surgery was consulted, due to the location is not amenable for drainage. General surgery recommended 2 weeks of antibiotics for which she has completed a course on 08/17/2024. She is currently tolerating her diet.  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 Low suspicious for pneumonia, procalcitonin is low yield. Viral panel is negative.  Bilateral lower extremity DVT: Age indeterminant now transition to oral Eliquis  on 08/12/2024 she is a poor candidate for Lovenox  due to cognitive issues.  Physical deconditioning: PT OT evaluated the patient and she has been denied skilled nursing facility, peer to peer was performed and she still was denied. She will need max home health arrangement. Palliative care has been consulted. Patient lives with son and will need home health at home.  Chronic respiratory failure with hypoxia: At baseline continue 3 L of oxygen .  Acute on chronic normocytic anemia: On admission hemoglobin 6.8, status post 3 units of packed red blood cells. Hemoglobin yesterday was 9.1.  Essential hypertension: Toprol  on hold due  to borderline blood  pressure.  Elevated troponins: Continue aspirin  Lipitor  and Repatha . She denies any chest pain or shortness of breath. Elevation in troponins likely due to demand ischemia.  History of skin cancer: Follow-up with oncology as an outpatient.  History of recurrent UTIs: On chronic Macrobid  therapy as an outpatient.  She is completing her course of antibiotics in house. Will resume Macrobid  as an outpatient.   Sacral decubitus ulcer stage II: RN Pressure Injury Documentation: Wound 08/20/24 2000 Pressure Injury Buttocks Left Stage 2 -  Partial thickness loss of dermis presenting as a shallow open injury with a red, pink wound bed without slough. (Active)    DVT prophylaxis: Eliquis  Family Communication:son Status is: Inpatient Remains inpatient appropriate because: Generalized weakness physical deconditioning    Code Status:     Code Status Orders  (From admission, onward)           Start     Ordered   08/08/24 2020  Full code  (Code Status)  Continuous       Question:  By:  Answer:  Consent: discussion documented in EHR   08/08/24 2020           Code Status History     Date Active Date Inactive Code Status Order ID Comments User Context   08/08/2024 1541 08/08/2024 2020 Do not attempt resuscitation (DNR) - Comfort care [id number]  Jeryl Skeens, MD Inpatient   08/03/2024 1143 08/08/2024 1541 Full Code [id number]  Jillian Buttery, MD ED   06/06/2024 1503 06/10/2024 1740 Full Code [id number]  Sonjia Held, MD ED   01/29/2024 0026 02/04/2024 1730 Full Code [id number]  Lee Kingfisher, MD ED   09/07/2022 0236 09/07/2022 1607 Full Code [id number]  Howerter, Eva NOVAK, DO ED   12/20/2020 0905 12/20/2020 2312 Full Code [id number]  Marilynne Rosaline SAILOR, MD Inpatient   11/09/2020 0601 11/10/2020 2230 Full Code [id number]  Lonzell Emeline HERO, DO ED   12/01/2019 1259 12/02/2019 1710 Full Code [id number]  Barrett, Shona MATSU, PA-C ED   10/27/2019 0030 10/28/2019 2035 Full Code [id number]  Martie Gatha ORN, MD Inpatient   10/27/2019 0016 10/27/2019 0029 Full Code [id number]  Wonda Sharper, MD Inpatient   10/27/2019 0016 10/27/2019 0016 Full Code [id number]  Wonda Sharper, MD Inpatient   04/05/2012 2048 04/12/2012 1621 Full Code [id number]  Olevia Josette CROME, RN Inpatient         IV Access:   Peripheral IV   Procedures and diagnostic studies:   No results found.    Medical Consultants:   None.   Subjective:    [name] pain is controlled.  Objective:    Vitals:   08/26/24 0612 08/26/24 1448 08/26/24 2304 08/27/24 0710  BP: (!) 127/54 130/63 129/60 (!) 151/67  Pulse: 81 87 79 86  Resp: 17 16 14 15   Temp: 98.4 F (36.9 C) 100 F (37.8 C) 98.6 F (37 C) (!) 97.4 F (36.3 C)  TempSrc:  Oral Oral Oral  SpO2: 93% 94% 95% 96%  Weight:      Height:       SpO2: 96 % O2 Flow Rate (L/min): 2 L/min   Intake/Output Summary (Last 24 hours) at 08/27/2024 1138 Last data filed at 08/26/2024 2200 Gross per 24 hour  Intake 60 ml  Output --  Net 60 ml   Filed Weights   08/03/24 0908 08/03/24 1359  Weight: 70.8 kg 71.5 kg    Exam: General exam: In no acute distress. Respiratory system: Good air  movement and clear to auscultation. Cardiovascular system: S1 & S2 heard, RRR. No JVD. Gastrointestinal system: Abdomen is nondistended, soft and nontender.  Extremities: No pedal edema. Skin: No rashes, lesions or ulcers Psychiatry: Judgement and insight appear normal. Mood & affect appropriate.  Data Reviewed:    Labs: Basic Metabolic Panel: Recent Labs  Lab 08/21/24 0341 08/23/24 0856 08/24/24 0400  NA 135 135 136  K 4.8 4.6 4.7  CL 102 101 105  CO2 23 22 24   GLUCOSE 126* 124* 101*  BUN 24* 30* 32*  CREATININE 1.56* 1.58* 1.61*  CALCIUM  8.5* 8.9 8.1*   GFR Estimated Creatinine Clearance: 25.4 mL/min (A) (by C-G formula based on SCr of 1.61 mg/dL (H)). Liver Function Tests: Recent Labs  Lab 08/23/24 0856 08/24/24 0400  AST 17 17  ALT <5 8  ALKPHOS  125 102  BILITOT 0.4 0.2  PROT 6.5 5.3*  ALBUMIN  2.9* 2.4*   No results for input(s): LIPASE, AMYLASE in the last 168 hours. No results for input(s): AMMONIA in the last 168 hours. Coagulation profile No results for input(s): INR, PROTIME in the last 168 hours. COVID-19 Labs  No results for input(s): DDIMER, FERRITIN, LDH, CRP in the last 72 hours.  Lab Results  Component Value Date   SARSCOV2NAA NEGATIVE 08/21/2024   SARSCOV2NAA NEGATIVE 08/03/2024   SARSCOV2NAA RESULT: NEGATIVE 07/12/2021   SARSCOV2NAA NEGATIVE 12/17/2020    CBC: Recent Labs  Lab 08/21/24 0341 08/23/24 0856 08/24/24 0400 08/25/24 0407 08/25/24 1925 08/26/24 0318  WBC 8.3 9.5 7.6 8.6 11.0* 10.8*  NEUTROABS 6.4  --   --   --   --   --   HGB 7.3* 8.3* 7.0* 7.0* 9.1* 9.1*  HCT 24.5* 27.8* 23.1* 23.2* 29.3* 29.3*  MCV 93.2 92.1 90.9 89.9 90.2 90.2  PLT 308 362 322 328 339 315   Cardiac Enzymes: No results for input(s): CKTOTAL, CKMB, CKMBINDEX, TROPONINI in the last 168 hours. BNP (last 3 results) No results for input(s): PROBNP in the last 8760 hours. CBG: Recent Labs  Lab 08/26/24 0805 08/26/24 1155 08/26/24 1735 08/26/24 2303 08/27/24 0817  GLUCAP 110* 133* 144* 117* 117*   D-Dimer: No results for input(s): DDIMER in the last 72 hours. Hgb A1c: No results for input(s): HGBA1C in the last 72 hours. Lipid Profile: No results for input(s): CHOL, HDL, LDLCALC, TRIG, CHOLHDL, LDLDIRECT in the last 72 hours. Thyroid  function studies: No results for input(s): TSH, T4TOTAL, T3FREE, THYROIDAB in the last 72 hours.  Invalid input(s): FREET3 Anemia work up: No results for input(s): VITAMINB12, FOLATE, FERRITIN, TIBC, IRON , RETICCTPCT in the last 72 hours. Sepsis Labs: Recent Labs  Lab 08/22/24 1439 08/23/24 0856 08/24/24 0400 08/25/24 0407 08/25/24 1925 08/26/24 0318  PROCALCITON 0.93  --   --   --   --   --   WBC  --    <  > 7.6 8.6 11.0* 10.8*   < > = values in this interval not displayed.   Microbiology Recent Results (from the past 240 hours)  Resp panel by RT-PCR (RSV, Flu A&B, Covid) Anterior Nasal Swab     Status: None   Collection Time: 08/21/24  8:20 AM   Specimen: Anterior Nasal Swab  Result Value Ref Range Status   SARS Coronavirus 2 by RT PCR NEGATIVE NEGATIVE Final    Comment: (NOTE) SARS-CoV-2 target nucleic acids are NOT DETECTED.  The SARS-CoV-2 RNA is generally detectable in upper respiratory specimens during the acute phase of infection. The lowest  concentration of SARS-CoV-2 viral copies this assay can detect is 138 copies/mL. A negative result does not preclude SARS-Cov-2 infection and should not be used as the sole basis for treatment or other patient management decisions. A negative result may occur with  improper specimen collection/handling, submission of specimen other than nasopharyngeal swab, presence of viral mutation(s) within the areas targeted by this assay, and inadequate number of viral copies(<138 copies/mL). A negative result must be combined with clinical observations, patient history, and epidemiological information. The expected result is Negative.  Fact Sheet for Patients:  bloggercourse.com  Fact Sheet for Healthcare Providers:  seriousbroker.it  This test is no t yet approved or cleared by the [place]  FDA and  has been authorized for detection and/or diagnosis of SARS-CoV-2 by FDA under an Emergency Use Authorization (EUA). This EUA will remain  in effect (meaning this test can be used) for the duration of the COVID-19 declaration under Section 564(b)(1) of the Act, 21 U.S.C.section 360bbb-3(b)(1), unless the authorization is terminated  or revoked sooner.       Influenza A by PCR NEGATIVE NEGATIVE Final   Influenza B by PCR NEGATIVE NEGATIVE Final    Comment: (NOTE) The Xpert Xpress  SARS-CoV-2/FLU/RSV plus assay is intended as an aid in the diagnosis of influenza from Nasopharyngeal swab specimens and should not be used as a sole basis for treatment. Nasal washings and aspirates are unacceptable for Xpert Xpress SARS-CoV-2/FLU/RSV testing.  Fact Sheet for Patients: bloggercourse.com  Fact Sheet for Healthcare Providers: seriousbroker.it  This test is not yet approved or cleared by the [place]  FDA and has been authorized for detection and/or diagnosis of SARS-CoV-2 by FDA under an Emergency Use Authorization (EUA). This EUA will remain in effect (meaning this test can be used) for the duration of the COVID-19 declaration under Section 564(b)(1) of the Act, 21 U.S.C. section 360bbb-3(b)(1), unless the authorization is terminated or revoked.     Resp Syncytial Virus by PCR NEGATIVE NEGATIVE Final    Comment: (NOTE) Fact Sheet for Patients: bloggercourse.com  Fact Sheet for Healthcare Providers: seriousbroker.it  This test is not yet approved or cleared by the [place]  FDA and has been authorized for detection and/or diagnosis of SARS-CoV-2 by FDA under an Emergency Use Authorization (EUA). This EUA will remain in effect (meaning this test can be used) for the duration of the COVID-19 declaration under Section 564(b)(1) of the Act, 21 U.S.C. section 360bbb-3(b)(1), unless the authorization is terminated or revoked.  Performed at [facility], [address] [place], [place] [address]   MRSA Next Gen by PCR, Nasal     Status: None   Collection Time: 08/22/24  3:47 PM   Specimen: Nasal Mucosa; Nasal Swab  Result Value Ref Range Status   MRSA by PCR Next Gen NOT DETECTED NOT DETECTED Final    Comment: (NOTE) The GeneXpert MRSA Assay (FDA approved for NASAL specimens only), is one component of a comprehensive MRSA colonization  surveillance program. It is not intended to diagnose MRSA infection nor to guide or monitor treatment for MRSA infections. Test performance is not FDA approved in patients less than 34 years old. Performed at [facility], [address] [place], [place] [address]   Body fluid culture w Gram Stain     Status: None (Preliminary result)   Collection Time: 08/23/24  2:57 PM   Specimen: Lung, Left; Pleural Fluid  Result Value Ref Range Status   Specimen Description   Final  PLEURAL Performed at [facility], [address], [place], [place] [address]    Special Requests   Final    LUNG,LEFT Performed at [facility], [address], [place], [place] [address]    Gram Stain   Final    RARE WBC PRESENT, PREDOMINANTLY PMN NO ORGANISMS SEEN    Culture   Final    NO GROWTH 3 DAYS Performed at [facility], [address] [place], [place] [address]    Report Status PENDING  Incomplete     Medications:    apixaban   5 mg Oral BID   budesonide -glycopyrrolate -formoterol   2 puff Inhalation BID   ferrous sulfate   325 mg Oral Q breakfast   insulin  aspart  0-5 Units Subcutaneous QHS   insulin  aspart  0-6 Units Subcutaneous TID WC   levothyroxine   175 mcg Oral Once per day on Monday Tuesday Wednesday Thursday   pantoprazole   40 mg Oral Daily   polyethylene glycol  17 g Oral BID   Ensure Max Protein  11 oz Oral BID BM   Continuous Infusions:    LOS: 24 days   Erle Odell Castor  [facility]  08/27/2024, 11:38 AM

## 2024-08-27 NOTE — Progress Notes (Signed)
 Physical Therapy Treatment Patient Details Name: Abigail [name] MRN: [id number] DOB: Sep 13, 1935 Today's Date: 08/27/2024   History of Present Illness Pt is an 88y.o female admitted on 08/03/24 with generalized weakness. She fell at home while trying to put her pants on and was unable to get up. PMH includes: metastatic non-small lung cancer to brain, currently on radiation treatment, chronic hypoxic respiratory failure on 3 L O2, CKD stage IIIb, 2 skin cancers on her legs s/p resection, hypertension, coronary artery disease, chronic normocytic anemia, hypothyroidism.    PT Comments  Pt demonstrates decr STM, poor safety awareness requiring cues throughout session. Pt is willing to work with PT, overall min assist for mobility, fatigues quickly with light activity.  Plan for SNF remains appropriate, if home pt will likely need HHPT/OT and near 24 hr supervision.     If plan is discharge home, recommend the following: A little help with walking and/or transfers;A little help with bathing/dressing/bathroom;Assist for transportation;Assistance with cooking/housework;Help with stairs or ramp for entrance;Direct supervision/assist for medications management;Supervision due to cognitive status   Can travel by private vehicle     Yes  Equipment Recommendations  None recommended by PT    Recommendations for Other Services       Precautions / Restrictions Precautions Precautions: Fall Recall of Precautions/Restrictions: Impaired Precaution/Restrictions Comments: O2 dep at baseline-3L COPD Restrictions Weight Bearing Restrictions Per Provider Order: No     Mobility  Bed Mobility Overal bed mobility: Needs Assistance Bed Mobility: Supine to Sit     Supine to sit: Min assist, HOB elevated, Used rails     General bed mobility comments: initial assist for L LE mangagement, assist to elevate trunk  and increased time to move to EOB    Transfers Overall transfer level: Needs  assistance Equipment used: Rolling walker (2 wheels) Transfers: Sit to/from Stand Sit to Stand: Min assist           General transfer comment: multi-modal cues for safety, assist to rise and transition to RW    Ambulation/Gait Ambulation/Gait assistance: Min assist Gait Distance (Feet): 12 Feet (x2) Assistive device: Rolling walker (2 wheels) Gait Pattern/deviations: Decreased step length - right, Decreased step length - left, Trunk flexed Gait velocity: decreased     General Gait Details: Slow gait speed. Assist to steady and manage RW throughout distance. Remained on 3L  O2. Pt is unsteady and fatigues  quickly   Stairs             Wheelchair Mobility     Tilt Bed    Modified Rankin (Stroke Patients Only)       Balance   Sitting-balance support: No upper extremity supported, Feet supported Sitting balance-Leahy Scale: Good Sitting balance - Comments: fair+ to good. pt able to wt shift laterally out of BOS and recover to midline without assist   Standing balance support: Bilateral upper extremity supported, During functional activity, Reliant on assistive device for balance Standing balance-Leahy Scale: Poor Standing balance comment: pt unable to balance without at least unilateral UE support                            Communication Communication Communication: No apparent difficulties  Cognition Arousal: Alert Behavior During Therapy: WFL for tasks assessed/performed   PT - Cognitive impairments: Memory, Problem solving, Initiation                       PT -  Cognition Comments: slow processing, requires safety cues throughout session. pt reports low vision blurry since radiation. forgot lunch was in front of her, talking on phone when PT arrived. required set up assist to cut food, grasp correct utensils, etc Following commands: Impaired Following commands impaired: Only follows one step commands consistently    Cueing  Cueing Techniques: Verbal cues, Tactile cues  Exercises      General Comments        Pertinent Vitals/Pain Pain Assessment Pain Assessment: Faces Faces Pain Scale: Hurts little more Pain Location: all over Pain Descriptors / Indicators: Grimacing Pain Intervention(s): Limited activity within patient's tolerance, Monitored during session, Repositioned    Home Living                          Prior Function            PT Goals (current goals can now be found in the care plan section) Acute Rehab PT Goals Patient Stated Goal: go home PT Goal Formulation: With patient Time For Goal Achievement: 09/07/24 Potential to Achieve Goals: Fair Progress towards PT goals: Progressing toward goals    Frequency    Min 3X/week      PT Plan      Co-evaluation              AM-PAC PT 6 Clicks Mobility   Outcome Measure  Help needed turning from your back to your side while in a flat bed without using bedrails?: A Little Help needed moving from lying on your back to sitting on the side of a flat bed without using bedrails?: A Little Help needed moving to and from a bed to a chair (including a wheelchair)?: A Little Help needed standing up from a chair using your arms (e.g., wheelchair or bedside chair)?: A Little Help needed to walk in hospital room?: A Little Help needed climbing 3-5 steps with a railing? : A Lot 6 Click Score: 17    End of Session Equipment Utilized During Treatment: Gait belt;Oxygen  Activity Tolerance: Patient limited by fatigue Patient left: in chair;with call bell/phone within reach;with chair alarm set Nurse Communication: Mobility status PT Visit Diagnosis: Unsteadiness on feet (R26.81);Muscle weakness (generalized) (M62.81);Difficulty in walking, not elsewhere classified (R26.2)     Time: 1207-1232 PT Time Calculation (min) (ACUTE ONLY): 25 min  Charges:    $Gait Training: 8-22 mins PT General Charges $$ ACUTE PT VISIT: 1  Visit                     Burns Timson, PT  Acute Rehab Dept [facility]) [phone]  08/27/2024    [facility] 08/27/2024, 12:44 PM

## 2024-08-28 DIAGNOSIS — N1832 Chronic kidney disease, stage 3b: Secondary | ICD-10-CM | POA: Diagnosis not present

## 2024-08-28 DIAGNOSIS — N179 Acute kidney failure, unspecified: Secondary | ICD-10-CM | POA: Diagnosis not present

## 2024-08-28 LAB — GLUCOSE, CAPILLARY
Glucose-Capillary: 104 mg/dL — ABNORMAL HIGH (ref 70–99)
Glucose-Capillary: 135 mg/dL — ABNORMAL HIGH (ref 70–99)
Glucose-Capillary: 121 mg/dL — ABNORMAL HIGH (ref 70–99)
Glucose-Capillary: 155 mg/dL — ABNORMAL HIGH (ref 70–99)

## 2024-08-28 NOTE — Plan of Care (Signed)
  Problem: Safety: Goal: Ability to remain free from injury will improve Outcome: Progressing   Problem: Pain Managment: Goal: General experience of comfort will improve and/or be controlled Outcome: Progressing   Problem: Coping: Goal: Level of anxiety will decrease Outcome: Progressing

## 2024-08-28 NOTE — Progress Notes (Signed)
 [facility] PROGRESS NOTE    Progress Note  Abigail [name]  FMW:[id number] DOB: [birth year] DOA: 08/03/2024 PCP: [name], FNP     Brief Narrative:   Abigail [name] is an 89 y.o. female past medical history of metastatic non-small cell lung cancer in 2022 with a solitary brain mass found in November 2025, chronic respiratory failure with hypoxia on 3 L of oxygen , essential hypertension presented to the ED on 08/03/2024 for generalized weakness resulting in a fall that has progressed since she started radiation therapy decreased oral intake and urinary frequency along with abdominal pain.  In the ED was found to be in acute kidney injury, hemoglobin of 6 point CT scan of the abdomen and pelvis showed severe left hydronephrosis, urinalysis was compatible with urinary tract infection urology was consulted, however they did not feel stent was needed due to clinical improvement of her renal function.  CT scan of the abdomen pelvis was repeated and showed diffuse diverticular disease of the colon with diverticulitis and fluid collection involving the left side pelvic wall with a 6 cm contained perforation or abscess.  General surgery was consulted and recommended 2 weeks of antibiotics which she completed on 08/17/2024.  Due to developing fevers of 101.5 on 08/21/2024 chest x-ray was done that revealed a left-sided pleural effusion CT of the chest showed interval increase of moderate size left pleural effusion with possible loculation.  Status post thoracocentesis on 08/24/2024. Awaiting insurance authorization.   Assessment/Plan:   Left-sided pleural effusion/with a history of non-small cell cancer with a solitary brain mets: Completed her chemotherapy and immunotherapy about a year ago with Dr. Gatha. Currently undergoing stereotactic radiosurgery for brain mets. A CT scan on 08/21/2024 showed moderate pleural effusion concerned about malignant pleural effusion. Pulmonary consulted. Due to  her advanced disease and complicated disease palliative care was consulted and she remains a full code. Status post thoracocentesis on 08/24/2024 yielded 700 cc of fluid. Likely malignant. Initially denied by insurance, family appealed, she awaiting insurance approval.  Acute kidney injury on chronic kidney disease stage IIIb/left hydronephrosis: Urology was consulted, as there appeared to be hydronephrosis on imaging. As her infection was treated her creatinine improved therefore urology held on stent placement. Repeated renal ultrasound on 08/13/2024 showed stable left hydronephrosis with improvement in renal function. Voiding trial has been successful. Will need further evaluation as an outpatient by urology.  Acute diverticulitis with abscess: Seen on imaging in 08/04/2024.  Measuring about 6 cm General Surgery was consulted, due to the location is not amenable for drainage. General surgery recommended 2 weeks of antibiotics for which she has completed a course on 08/17/2024. She is currently tolerating her diet.  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 Low suspicious for pneumonia, procalcitonin is low yield. Viral panel is negative.  Bilateral lower extremity DVT: Age indeterminant now transition to oral Eliquis  on 08/12/2024 she is a poor candidate for Lovenox  due to cognitive issues.  Physical deconditioning: PT OT evaluated the patient and she has been denied skilled nursing facility, peer to peer was performed and she still was denied. She will need max home health arrangement. Palliative care has been consulted. Patient lives with son and will need home health at home.  Chronic respiratory failure with hypoxia: At baseline continue 3 L of oxygen .  Acute on chronic normocytic anemia: On admission hemoglobin 6.8, status post 3 units of packed red blood cells. Hemoglobin yesterday was 9.1.  Essential  hypertension: Toprol  on hold due to borderline blood pressure.  Elevated troponins: Continue aspirin  Lipitor  and Repatha . She denies any chest pain or shortness of breath. Elevation in troponins likely due to demand ischemia.  History of skin cancer: Follow-up with oncology as an outpatient.  History of recurrent UTIs: On chronic Macrobid  therapy as an outpatient.  She is completing her course of antibiotics in house. Will resume Macrobid  as an outpatient.   Sacral decubitus ulcer stage II: RN Pressure Injury Documentation: Wound 08/20/24 2000 Pressure Injury Buttocks Left Stage 2 -  Partial thickness loss of dermis presenting as a shallow open injury with a red, pink wound bed without slough. (Active)    DVT prophylaxis: Eliquis  Family Communication:son Status is: Inpatient Remains inpatient appropriate because: Generalized weakness physical deconditioning    Code Status:     Code Status Orders  (From admission, onward)           Start     Ordered   08/08/24 2020  Full code  (Code Status)  Continuous       Question:  By:  Answer:  Consent: discussion documented in EHR   08/08/24 2020           Code Status History     Date Active Date Inactive Code Status Order ID Comments User Context   08/08/2024 1541 08/08/2024 2020 Do not attempt resuscitation (DNR) - Comfort care [id number]  Jeryl Skeens, MD Inpatient   08/03/2024 1143 08/08/2024 1541 Full Code [id number]  Jillian Buttery, MD ED   06/06/2024 1503 06/10/2024 1740 Full Code [id number]  Sonjia Held, MD ED   01/29/2024 0026 02/04/2024 1730 Full Code [id number]  Lee Kingfisher, MD ED   09/07/2022 0236 09/07/2022 1607 Full Code [id number]  Howerter, Eva NOVAK, DO ED   12/20/2020 0905 12/20/2020 2312 Full Code [id number]  Marilynne Rosaline SAILOR, MD Inpatient   11/09/2020 0601 11/10/2020 2230 Full Code [id number]  Lonzell Emeline HERO, DO ED   12/01/2019 1259 12/02/2019 1710 Full Code [id number]  Barrett, Shona MATSU, PA-C ED   10/27/2019  0030 10/28/2019 2035 Full Code [id number]  Martie Gatha ORN, MD Inpatient   10/27/2019 0016 10/27/2019 0029 Full Code [id number]  Wonda Sharper, MD Inpatient   10/27/2019 0016 10/27/2019 0016 Full Code [id number]  Wonda Sharper, MD Inpatient   04/05/2012 2048 04/12/2012 1621 Full Code [id number]  Olevia Josette CROME, RN Inpatient         IV Access:   Peripheral IV   Procedures and diagnostic studies:   No results found.    Medical Consultants:   None.   Subjective:    [name] pain is controlled.  Objective:    Vitals:   08/27/24 0710 08/27/24 1715 08/27/24 2225 08/28/24 0609  BP: (!) 151/67 (!) 145/60 132/61 (!) 143/63  Pulse: 86 80 80 68  Resp: 15 15 17 15   Temp: (!) 97.4 F (36.3 C) 98.1 F (36.7 C) 98.8 F (37.1 C) 98.2 F (36.8 C)  TempSrc: Oral Oral Oral Oral  SpO2: 96% 99% 92% 97%  Weight:      Height:       SpO2: 97 % O2 Flow Rate (L/min): 2 L/min   Intake/Output Summary (Last 24 hours) at 08/28/2024 1034 Last data filed at 08/28/2024 1000 Gross per 24 hour  Intake 720 ml  Output --  Net 720 ml   Filed Weights   08/03/24 0908 08/03/24 1359  Weight: 70.8 kg 71.5 kg    Exam: General exam: In  no acute distress. Respiratory system: Good air movement and clear to auscultation. Cardiovascular system: S1 & S2 heard, RRR. No JVD. Gastrointestinal system: Abdomen is nondistended, soft and nontender.  Extremities: No pedal edema. Skin: No rashes, lesions or ulcers Psychiatry: Judgement and insight appear normal. Mood & affect appropriate.  Data Reviewed:    Labs: Basic Metabolic Panel: Recent Labs  Lab 08/23/24 0856 08/24/24 0400  NA 135 136  K 4.6 4.7  CL 101 105  CO2 22 24  GLUCOSE 124* 101*  BUN 30* 32*  CREATININE 1.58* 1.61*  CALCIUM  8.9 8.1*   GFR Estimated Creatinine Clearance: 25.4 mL/min (A) (by C-G formula based on SCr of 1.61 mg/dL (H)). Liver Function Tests: Recent Labs  Lab 08/23/24 0856 08/24/24 0400  AST 17 17  ALT <5  8  ALKPHOS 125 102  BILITOT 0.4 0.2  PROT 6.5 5.3*  ALBUMIN  2.9* 2.4*   No results for input(s): LIPASE, AMYLASE in the last 168 hours. No results for input(s): AMMONIA in the last 168 hours. Coagulation profile No results for input(s): INR, PROTIME in the last 168 hours. COVID-19 Labs  No results for input(s): DDIMER, FERRITIN, LDH, CRP in the last 72 hours.  Lab Results  Component Value Date   SARSCOV2NAA NEGATIVE 08/21/2024   SARSCOV2NAA NEGATIVE 08/03/2024   SARSCOV2NAA RESULT: NEGATIVE 07/12/2021   SARSCOV2NAA NEGATIVE 12/17/2020    CBC: Recent Labs  Lab 08/23/24 0856 08/24/24 0400 08/25/24 0407 08/25/24 1925 08/26/24 0318  WBC 9.5 7.6 8.6 11.0* 10.8*  HGB 8.3* 7.0* 7.0* 9.1* 9.1*  HCT 27.8* 23.1* 23.2* 29.3* 29.3*  MCV 92.1 90.9 89.9 90.2 90.2  PLT 362 322 328 339 315   Cardiac Enzymes: No results for input(s): CKTOTAL, CKMB, CKMBINDEX, TROPONINI in the last 168 hours. BNP (last 3 results) No results for input(s): PROBNP in the last 8760 hours. CBG: Recent Labs  Lab 08/27/24 0817 08/27/24 1213 08/27/24 1653 08/27/24 2227 08/28/24 0731  GLUCAP 117* 167* 181* 114* 104*   D-Dimer: No results for input(s): DDIMER in the last 72 hours. Hgb A1c: No results for input(s): HGBA1C in the last 72 hours. Lipid Profile: No results for input(s): CHOL, HDL, LDLCALC, TRIG, CHOLHDL, LDLDIRECT in the last 72 hours. Thyroid  function studies: No results for input(s): TSH, T4TOTAL, T3FREE, THYROIDAB in the last 72 hours.  Invalid input(s): FREET3 Anemia work up: No results for input(s): VITAMINB12, FOLATE, FERRITIN, TIBC, IRON , RETICCTPCT in the last 72 hours. Sepsis Labs: Recent Labs  Lab 08/22/24 1439 08/23/24 0856 08/24/24 0400 08/25/24 0407 08/25/24 1925 08/26/24 0318  PROCALCITON 0.93  --   --   --   --   --   WBC  --    < > 7.6 8.6 11.0* 10.8*   < > = values in this interval not  displayed.   Microbiology Recent Results (from the past 240 hours)  Resp panel by RT-PCR (RSV, Flu A&B, Covid) Anterior Nasal Swab     Status: None   Collection Time: 08/21/24  8:20 AM   Specimen: Anterior Nasal Swab  Result Value Ref Range Status   SARS Coronavirus 2 by RT PCR NEGATIVE NEGATIVE Final    Comment: (NOTE) SARS-CoV-2 target nucleic acids are NOT DETECTED.  The SARS-CoV-2 RNA is generally detectable in upper respiratory specimens during the acute phase of infection. The lowest concentration of SARS-CoV-2 viral copies this assay can detect is 138 copies/mL. A negative result does not preclude SARS-Cov-2 infection and should not be used as the sole  basis for treatment or other patient management decisions. A negative result may occur with  improper specimen collection/handling, submission of specimen other than nasopharyngeal swab, presence of viral mutation(s) within the areas targeted by this assay, and inadequate number of viral copies(<138 copies/mL). A negative result must be combined with clinical observations, patient history, and epidemiological information. The expected result is Negative.  Fact Sheet for Patients:  bloggercourse.com  Fact Sheet for Healthcare Providers:  seriousbroker.it  This test is no t yet approved or cleared by the [place]  FDA and  has been authorized for detection and/or diagnosis of SARS-CoV-2 by FDA under an Emergency Use Authorization (EUA). This EUA will remain  in effect (meaning this test can be used) for the duration of the COVID-19 declaration under Section 564(b)(1) of the Act, 21 U.S.C.section 360bbb-3(b)(1), unless the authorization is terminated  or revoked sooner.       Influenza A by PCR NEGATIVE NEGATIVE Final   Influenza B by PCR NEGATIVE NEGATIVE Final    Comment: (NOTE) The Xpert Xpress SARS-CoV-2/FLU/RSV plus assay is intended as an aid in the diagnosis of  influenza from Nasopharyngeal swab specimens and should not be used as a sole basis for treatment. Nasal washings and aspirates are unacceptable for Xpert Xpress SARS-CoV-2/FLU/RSV testing.  Fact Sheet for Patients: bloggercourse.com  Fact Sheet for Healthcare Providers: seriousbroker.it  This test is not yet approved or cleared by the [place]  FDA and has been authorized for detection and/or diagnosis of SARS-CoV-2 by FDA under an Emergency Use Authorization (EUA). This EUA will remain in effect (meaning this test can be used) for the duration of the COVID-19 declaration under Section 564(b)(1) of the Act, 21 U.S.C. section 360bbb-3(b)(1), unless the authorization is terminated or revoked.     Resp Syncytial Virus by PCR NEGATIVE NEGATIVE Final    Comment: (NOTE) Fact Sheet for Patients: bloggercourse.com  Fact Sheet for Healthcare Providers: seriousbroker.it  This test is not yet approved or cleared by the [place]  FDA and has been authorized for detection and/or diagnosis of SARS-CoV-2 by FDA under an Emergency Use Authorization (EUA). This EUA will remain in effect (meaning this test can be used) for the duration of the COVID-19 declaration under Section 564(b)(1) of the Act, 21 U.S.C. section 360bbb-3(b)(1), unless the authorization is terminated or revoked.  Performed at [facility], [address] [place], [place] [address]   MRSA Next Gen by PCR, Nasal     Status: None   Collection Time: 08/22/24  3:47 PM   Specimen: Nasal Mucosa; Nasal Swab  Result Value Ref Range Status   MRSA by PCR Next Gen NOT DETECTED NOT DETECTED Final    Comment: (NOTE) The GeneXpert MRSA Assay (FDA approved for NASAL specimens only), is one component of a comprehensive MRSA colonization surveillance program. It is not intended to diagnose MRSA infection nor to  guide or monitor treatment for MRSA infections. Test performance is not FDA approved in patients less than 69 years old. Performed at [facility], [address], [place], [place] [address]   Body fluid culture w Gram Stain     Status: None   Collection Time: 08/23/24  2:57 PM   Specimen: Lung, Left; Pleural Fluid  Result Value Ref Range Status   Specimen Description   Final    PLEURAL Performed at [facility], [address] [place], [place] [address]    Special Requests   Final    LUNG,LEFT Performed at  [facility], [address] [place], [place] [address]    Gram Stain   Final    RARE WBC PRESENT, PREDOMINANTLY PMN NO ORGANISMS SEEN    Culture   Final    NO GROWTH 3 DAYS Performed at [facility], [address], [place], [place] [address]    Report Status 08/27/2024 FINAL  Final     Medications:    apixaban   5 mg Oral BID   budesonide -glycopyrrolate -formoterol   2 puff Inhalation BID   ferrous sulfate   325 mg Oral Q breakfast   insulin  aspart  0-5 Units Subcutaneous QHS   insulin  aspart  0-6 Units Subcutaneous TID WC   levothyroxine   175 mcg Oral Once per day on Monday Tuesday Wednesday Thursday   metoprolol  succinate  50 mg Oral QPM   pantoprazole   40 mg Oral Daily   polyethylene glycol  17 g Oral BID   Ensure Max Protein  11 oz Oral BID BM   Continuous Infusions:    LOS: 25 days   Erle Odell Castor  [facility]  08/28/2024, 10:34 AM

## 2024-08-28 NOTE — Plan of Care (Signed)
   Problem: Coping: Goal: Level of anxiety will decrease Outcome: Progressing   Problem: Pain Managment: Goal: General experience of comfort will improve and/or be controlled Outcome: Progressing   Problem: Safety: Goal: Ability to remain free from injury will improve Outcome: Progressing

## 2024-08-28 NOTE — Plan of Care (Signed)

## 2024-08-29 DIAGNOSIS — E860 Dehydration: Secondary | ICD-10-CM | POA: Diagnosis not present

## 2024-08-29 DIAGNOSIS — N179 Acute kidney failure, unspecified: Secondary | ICD-10-CM | POA: Diagnosis not present

## 2024-08-29 DIAGNOSIS — N1832 Chronic kidney disease, stage 3b: Secondary | ICD-10-CM | POA: Diagnosis not present

## 2024-08-29 DIAGNOSIS — D649 Anemia, unspecified: Secondary | ICD-10-CM | POA: Diagnosis not present

## 2024-08-29 LAB — GLUCOSE, CAPILLARY
Glucose-Capillary: 108 mg/dL — ABNORMAL HIGH (ref 70–99)
Glucose-Capillary: 137 mg/dL — ABNORMAL HIGH (ref 70–99)
Glucose-Capillary: 131 mg/dL — ABNORMAL HIGH (ref 70–99)
Glucose-Capillary: 134 mg/dL — ABNORMAL HIGH (ref 70–99)

## 2024-08-29 LAB — CYTOLOGY - NON PAP

## 2024-08-29 NOTE — TOC Progression Note (Addendum)
 Transition of Care [facility]) - Progression Note   Patient Details  Name: Abigail [name] MRN: [id number] Date of Birth: Nov 01, 1935  [facility]) CM/SW Contact  Duwaine GORMAN Aran, LCSW Phone Number: 08/29/2024, 2:36 PM  Clinical Narrative: CSW received call from Tammy with [facility] and the SNF denial was upheld, so patient will not be able to go to SNF. CSW attempted to call the son, [name], twice but the phone was disconnected. CSW called daughter, Charleen Homer, to discuss discharge planning. Daughter confirmed son's number. CSW discussed patient going [facility] with [facility] or hospice. Daughter reported the family knows she needs to go home with hospice and will at least need a hospital bed and BSC. Daughter confirmed patient's son lives with her, but he works most of the day and will not be available 24/7 to the patient and patient's cousin, Heron, could be called for discharge planning. CSW attempted to explain the limitations of hospice services, but daughter cut off CSW stating, I know.  Daughter reported that the patient needed to apply for Medicaid and thought the hospital would complete the application. CSW explained that the patient/family would need to apply as the hospital does not have access to the necessary financial information. Daughter became increasingly angry that she was told by staff family would need to fill the gaps in care for the patient. CSW explained that Medicare does not cover LTC at a facility or an aide. Daughter requested staff names that had no right to speak to me that way, which CSW declined to provide. CSW attempted to provide information regarding filling a complaint, but daughter continued to demand names. CSW notified daughter that the call would be ended at this time.  CSW followed up with Heron. Per Heron, the family is in agreement with hospice but would prefer the patient go to a facility. CSW explained that daughter has already stated the family cannot afford to pay  for services and without Medicaid, patient does not have payor source. Heron reported she thinks the patient's name may be on the deed for the house that she lives in with her son, but is unsure. CSW also recommended to Heron to have the patient's family apply for Medicaid, but to be aware that if patient has assets she will likely not be eligible. CSW asked about choice for hospice and [facility] was chosen. Heron also agreeable to referral to [facility]. CSW made referrals to Amy with [facility] and Harlene with [facility].  CSW called son again and he reported his phone was blocking the hospital's number. CSW discussed patient coming home with hospice. Son reported he works 16-18hrs/day and patient will not have 24/7 support and she did not prior to this hospitalization. CSW discussed applying for Medicaid and a referral to [facility] for the patient.  Expected Discharge Plan: Home w Home Health Services Barriers to Discharge: Continued Medical Work up  Expected Discharge Plan and Services Living arrangements for the past 2 months: Single Family Home  Social Drivers of Health (SDOH) Interventions SDOH Screenings   Food Insecurity: No Food Insecurity (08/03/2024)  Housing: Low Risk (08/03/2024)  Transportation Needs: No Transportation Needs (08/03/2024)  Utilities: Not At Risk (08/03/2024)  Alcohol Screen: Low Risk (03/09/2024)  Depression (PHQ2-9): Low Risk (07/01/2024)  Financial Resource Strain: Medium Risk (03/09/2024)  Physical Activity: Inactive (03/09/2024)  Social Connections: Moderately Isolated (08/03/2024)  Stress: No Stress Concern Present (03/09/2024)  Recent Concern: Stress - Stress Concern Present (03/09/2024)  Tobacco Use: Medium  Risk (08/05/2024)  Health Literacy: Adequate Health Literacy (03/09/2024)   Readmission Risk Interventions    08/23/2024    1:08 PM 06/07/2024    3:57 PM  Readmission Risk Prevention Plan  Transportation  Screening Complete Complete  PCP or Specialist Appt within 3-5 Days  Complete  HRI or Home Care Consult  Complete  Social Work Consult for Recovery Care Planning/Counseling  Complete  Palliative Care Screening  Complete  Medication Review Oceanographer) Complete Referral to Pharmacy  PCP or Specialist appointment within 3-5 days of discharge Complete   HRI or Home Care Consult Complete   SW Recovery Care/Counseling Consult Complete   Palliative Care Screening Not Applicable   Skilled Nursing Facility Complete

## 2024-08-29 NOTE — Progress Notes (Signed)
 Mobility Specialist - Progress Note   08/29/24 1258  Mobility  Activity Ambulated with assistance  Level of Assistance Moderate assist, patient does 50-74%  Assistive Device Front wheel walker  Distance Ambulated (ft) 18 ft  Range of Motion/Exercises Active  Activity Response Tolerated fair  Mobility visit 1 Mobility  Mobility Specialist Start Time (ACUTE ONLY) 1230  Mobility Specialist Stop Time (ACUTE ONLY) 1258  Mobility Specialist Time Calculation (min) (ACUTE ONLY) 28 min   Pt was found in bed and agreeable to mobilize. Assisted to [facility] prior to ambulation in room. Required safety cues throughout session.Ambulated ~21ft then took seated rest break. Stated feeling dizzy. Ambulated ~49ft to recliner chair. At EOS was left on recliner chair with all needs met. Call bell in reach and chair alarm on. RN notified.   Erminio Leos,  Mobility Specialist Can be reached via Secure Chat

## 2024-08-29 NOTE — TOC Progression Note (Signed)
 Transition of Care [facility]) - Progression Note   Patient Details  Name: Abigail [name] MRN: [id number] Date of Birth: 07/03/1936  Transition of Care [facility]) CM/SW Contact  Duwaine GORMAN Aran, LCSW Phone Number: 08/29/2024, 1:12 PM  Clinical Narrative: CSW left voicemail for [facility] to follow up regarding the SNF appeal. Care management awaiting update.  Expected Discharge Plan: Home w Home Health Services Barriers to Discharge: Continued Medical Work up  Expected Discharge Plan and Services Living arrangements for the past 2 months: Single Family Home  Social Drivers of Health (SDOH) Interventions SDOH Screenings   Food Insecurity: No Food Insecurity (08/03/2024)  Housing: Low Risk (08/03/2024)  Transportation Needs: No Transportation Needs (08/03/2024)  Utilities: Not At Risk (08/03/2024)  Alcohol Screen: Low Risk (03/09/2024)  Depression (PHQ2-9): Low Risk (07/01/2024)  Financial Resource Strain: Medium Risk (03/09/2024)  Physical Activity: Inactive (03/09/2024)  Social Connections: Moderately Isolated (08/03/2024)  Stress: No Stress Concern Present (03/09/2024)  Recent Concern: Stress - Stress Concern Present (03/09/2024)  Tobacco Use: Medium Risk (08/05/2024)  Health Literacy: Adequate Health Literacy (03/09/2024)   Readmission Risk Interventions    08/23/2024    1:08 PM 06/07/2024    3:57 PM  Readmission Risk Prevention Plan  Transportation Screening Complete Complete  PCP or Specialist Appt within 3-5 Days  Complete  HRI or Home Care Consult  Complete  Social Work Consult for Recovery Care Planning/Counseling  Complete  Palliative Care Screening  Complete  Medication Review Oceanographer) Complete Referral to Pharmacy  PCP or Specialist appointment within 3-5 days of discharge Complete   HRI or Home Care Consult Complete   SW Recovery Care/Counseling Consult Complete   Palliative Care Screening Not Applicable   Skilled Nursing Facility Complete

## 2024-08-29 NOTE — Progress Notes (Signed)
 [facility] PROGRESS NOTE    Progress Note  Abigail [name]  FMW:[id number] DOB: 1936/03/18 DOA: 08/03/2024 PCP: [name], FNP     Brief Narrative:   GLAYDS INSCO is an 89 y.o. female past medical history of metastatic non-small cell lung cancer in 2022 with a solitary brain mass found in November 2025, chronic respiratory failure with hypoxia on 3 L of oxygen , essential hypertension presented to the ED on 08/03/2024 for generalized weakness resulting in a fall that has progressed since she started radiation therapy decreased oral intake and urinary frequency along with abdominal pain.  In the ED was found to be in acute kidney injury, hemoglobin of 6 point CT scan of the abdomen and pelvis showed severe left hydronephrosis, urinalysis was compatible with urinary tract infection urology was consulted, however they did not feel stent was needed due to clinical improvement of her renal function.  CT scan of the abdomen pelvis was repeated and showed diffuse diverticular disease of the colon with diverticulitis and fluid collection involving the left side pelvic wall with a 6 cm contained perforation or abscess.  General surgery was consulted and recommended 2 weeks of antibiotics which she completed on 08/17/2024.  Due to developing fevers of 101.5 on 08/21/2024 chest x-ray was done that revealed a left-sided pleural effusion CT of the chest showed interval increase of moderate size left pleural effusion with possible loculation.  Status post thoracocentesis on 08/24/2024. Awaiting insurance authorization.   Assessment/Plan:   Left-sided pleural effusion/with a history of non-small cell cancer with a solitary brain mets: Completed her chemotherapy and immunotherapy about a year ago with Dr. Gatha. Currently undergoing stereotactic radiosurgery for brain mets. A CT scan on 08/21/2024 showed moderate pleural effusion concerned about malignant pleural effusion. Pulmonary consulted. Due to  her advanced disease and complicated disease palliative care was consulted and she remains a full code. Status post thoracocentesis on 08/24/2024 yielded 700 cc of fluid. Likely malignant. Initially denied by insurance, family appealed, she awaiting insurance approval.  Acute kidney injury on chronic kidney disease stage IIIb/left hydronephrosis: Urology was consulted, as there appeared to be hydronephrosis on imaging. As her infection was treated her creatinine improved therefore urology held on stent placement. Repeated renal ultrasound on 08/13/2024 showed stable left hydronephrosis with improvement in renal function. Voiding trial has been successful. Will need further evaluation as an outpatient by urology.  Acute diverticulitis with abscess: Seen on imaging in 08/04/2024.  Measuring about 6 cm General Surgery was consulted, due to the location is not amenable for drainage. General surgery recommended 2 weeks of antibiotics for which she has completed a course on 08/17/2024. She is currently tolerating her diet.  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 Low suspicious for pneumonia, procalcitonin is low yield. Viral panel is negative.  Bilateral lower extremity DVT: Age indeterminant now transition to oral Eliquis  on 08/12/2024 she is a poor candidate for Lovenox  due to cognitive issues.  Physical deconditioning: PT OT evaluated the patient and she has been denied skilled nursing facility, peer to peer was performed and she still was denied. She will need max home health arrangement. Palliative care has been consulted. Patient lives with son and will need home health at home.  Chronic respiratory failure with hypoxia: At baseline continue 3 L of oxygen .  Acute on chronic normocytic anemia: On admission hemoglobin 6.8, status post 3 units of packed red blood cells. Hemoglobin yesterday was 9.1.  Essential  hypertension: Toprol  on hold due to borderline blood pressure.  Elevated troponins: Continue aspirin  Lipitor  and Repatha . She denies any chest pain or shortness of breath. Elevation in troponins likely due to demand ischemia.  History of skin cancer: Follow-up with oncology as an outpatient.  History of recurrent UTIs: On chronic Macrobid  therapy as an outpatient.  She is completing her course of antibiotics in house. Will resume Macrobid  as an outpatient.   Sacral decubitus ulcer stage II: RN Pressure Injury Documentation: Wound 08/20/24 2000 Pressure Injury Buttocks Left Stage 2 -  Partial thickness loss of dermis presenting as a shallow open injury with a red, pink wound bed without slough. (Active)    DVT prophylaxis: Eliquis  Family Communication:son Status is: Inpatient Remains inpatient appropriate because: Generalized weakness physical deconditioning    Code Status:     Code Status Orders  (From admission, onward)           Start     Ordered   08/08/24 2020  Full code  (Code Status)  Continuous       Question:  By:  Answer:  Consent: discussion documented in EHR   08/08/24 2020           Code Status History     Date Active Date Inactive Code Status Order ID Comments User Context   08/08/2024 1541 08/08/2024 2020 Do not attempt resuscitation (DNR) - Comfort care [id number]  Jeryl Skeens, MD Inpatient   08/03/2024 1143 08/08/2024 1541 Full Code [id number]  Jillian Buttery, MD ED   06/06/2024 1503 06/10/2024 1740 Full Code [id number]  Sonjia Held, MD ED   01/29/2024 0026 02/04/2024 1730 Full Code [id number]  Lee Kingfisher, MD ED   09/07/2022 0236 09/07/2022 1607 Full Code [id number]  Howerter, Eva NOVAK, DO ED   12/20/2020 0905 12/20/2020 2312 Full Code [id number]  Marilynne Rosaline SAILOR, MD Inpatient   11/09/2020 0601 11/10/2020 2230 Full Code [id number]  Lonzell Emeline HERO, DO ED   12/01/2019 1259 12/02/2019 1710 Full Code [id number]  Barrett, Shona MATSU, PA-C ED   10/27/2019  0030 10/28/2019 2035 Full Code [id number]  Martie Gatha ORN, MD Inpatient   10/27/2019 0016 10/27/2019 0029 Full Code [id number]  Wonda Sharper, MD Inpatient   10/27/2019 0016 10/27/2019 0016 Full Code [id number]  Wonda Sharper, MD Inpatient   04/05/2012 2048 04/12/2012 1621 Full Code [id number]  Olevia Josette CROME, RN Inpatient         IV Access:   Peripheral IV   Procedures and diagnostic studies:   No results found.    Medical Consultants:   None.   Subjective:    [name] pain is controlled.  Objective:    Vitals:   08/28/24 0609 08/28/24 1312 08/28/24 2235 08/29/24 0500  BP: (!) 143/63 (!) 142/65 137/64 (!) 132/59  Pulse: 68 80 73 64  Resp: 15 16 15 16   Temp: 98.2 F (36.8 C) 98.4 F (36.9 C) 98.5 F (36.9 C) 98 F (36.7 C)  TempSrc: Oral  Oral Oral  SpO2: 97% 94% 94% 95%  Weight:      Height:       SpO2: 95 % O2 Flow Rate (L/min): 2 L/min   Intake/Output Summary (Last 24 hours) at 08/29/2024 0939 Last data filed at 08/29/2024 0600 Gross per 24 hour  Intake 300 ml  Output --  Net 300 ml   Filed Weights   08/03/24 0908 08/03/24 1359  Weight: 70.8 kg 71.5 kg    Exam: General exam: In no  acute distress. Respiratory system: Good air movement and clear to auscultation. Cardiovascular system: S1 & S2 heard, RRR. No JVD. Gastrointestinal system: Abdomen is nondistended, soft and nontender.  Extremities: No pedal edema. Skin: No rashes, lesions or ulcers Psychiatry: Judgement and insight appear normal. Mood & affect appropriate.  Data Reviewed:    Labs: Basic Metabolic Panel: Recent Labs  Lab 08/23/24 0856 08/24/24 0400  NA 135 136  K 4.6 4.7  CL 101 105  CO2 22 24  GLUCOSE 124* 101*  BUN 30* 32*  CREATININE 1.58* 1.61*  CALCIUM  8.9 8.1*   GFR Estimated Creatinine Clearance: 25.4 mL/min (A) (by C-G formula based on SCr of 1.61 mg/dL (H)). Liver Function Tests: Recent Labs  Lab 08/23/24 0856 08/24/24 0400  AST 17 17  ALT <5 8   ALKPHOS 125 102  BILITOT 0.4 0.2  PROT 6.5 5.3*  ALBUMIN  2.9* 2.4*   No results for input(s): LIPASE, AMYLASE in the last 168 hours. No results for input(s): AMMONIA in the last 168 hours. Coagulation profile No results for input(s): INR, PROTIME in the last 168 hours. COVID-19 Labs  No results for input(s): DDIMER, FERRITIN, LDH, CRP in the last 72 hours.  Lab Results  Component Value Date   SARSCOV2NAA NEGATIVE 08/21/2024   SARSCOV2NAA NEGATIVE 08/03/2024   SARSCOV2NAA RESULT: NEGATIVE 07/12/2021   SARSCOV2NAA NEGATIVE 12/17/2020    CBC: Recent Labs  Lab 08/23/24 0856 08/24/24 0400 08/25/24 0407 08/25/24 1925 08/26/24 0318  WBC 9.5 7.6 8.6 11.0* 10.8*  HGB 8.3* 7.0* 7.0* 9.1* 9.1*  HCT 27.8* 23.1* 23.2* 29.3* 29.3*  MCV 92.1 90.9 89.9 90.2 90.2  PLT 362 322 328 339 315   Cardiac Enzymes: No results for input(s): CKTOTAL, CKMB, CKMBINDEX, TROPONINI in the last 168 hours. BNP (last 3 results) No results for input(s): PROBNP in the last 8760 hours. CBG: Recent Labs  Lab 08/28/24 0731 08/28/24 1150 08/28/24 1641 08/28/24 2237 08/29/24 0743  GLUCAP 104* 135* 121* 155* 108*   D-Dimer: No results for input(s): DDIMER in the last 72 hours. Hgb A1c: No results for input(s): HGBA1C in the last 72 hours. Lipid Profile: No results for input(s): CHOL, HDL, LDLCALC, TRIG, CHOLHDL, LDLDIRECT in the last 72 hours. Thyroid  function studies: No results for input(s): TSH, T4TOTAL, T3FREE, THYROIDAB in the last 72 hours.  Invalid input(s): FREET3 Anemia work up: No results for input(s): VITAMINB12, FOLATE, FERRITIN, TIBC, IRON , RETICCTPCT in the last 72 hours. Sepsis Labs: Recent Labs  Lab 08/22/24 1439 08/23/24 0856 08/24/24 0400 08/25/24 0407 08/25/24 1925 08/26/24 0318  PROCALCITON 0.93  --   --   --   --   --   WBC  --    < > 7.6 8.6 11.0* 10.8*   < > = values in this interval not displayed.    Microbiology Recent Results (from the past 240 hours)  Resp panel by RT-PCR (RSV, Flu A&B, Covid) Anterior Nasal Swab     Status: None   Collection Time: 08/21/24  8:20 AM   Specimen: Anterior Nasal Swab  Result Value Ref Range Status   SARS Coronavirus 2 by RT PCR NEGATIVE NEGATIVE Final    Comment: (NOTE) SARS-CoV-2 target nucleic acids are NOT DETECTED.  The SARS-CoV-2 RNA is generally detectable in upper respiratory specimens during the acute phase of infection. The lowest concentration of SARS-CoV-2 viral copies this assay can detect is 138 copies/mL. A negative result does not preclude SARS-Cov-2 infection and should not be used as the sole basis  for treatment or other patient management decisions. A negative result may occur with  improper specimen collection/handling, submission of specimen other than nasopharyngeal swab, presence of viral mutation(s) within the areas targeted by this assay, and inadequate number of viral copies(<138 copies/mL). A negative result must be combined with clinical observations, patient history, and epidemiological information. The expected result is Negative.  Fact Sheet for Patients:  bloggercourse.com  Fact Sheet for Healthcare Providers:  seriousbroker.it  This test is no t yet approved or cleared by the [place]  FDA and  has been authorized for detection and/or diagnosis of SARS-CoV-2 by FDA under an Emergency Use Authorization (EUA). This EUA will remain  in effect (meaning this test can be used) for the duration of the COVID-19 declaration under Section 564(b)(1) of the Act, 21 U.S.C.section 360bbb-3(b)(1), unless the authorization is terminated  or revoked sooner.       Influenza A by PCR NEGATIVE NEGATIVE Final   Influenza B by PCR NEGATIVE NEGATIVE Final    Comment: (NOTE) The Xpert Xpress SARS-CoV-2/FLU/RSV plus assay is intended as an aid in the diagnosis of influenza  from Nasopharyngeal swab specimens and should not be used as a sole basis for treatment. Nasal washings and aspirates are unacceptable for Xpert Xpress SARS-CoV-2/FLU/RSV testing.  Fact Sheet for Patients: bloggercourse.com  Fact Sheet for Healthcare Providers: seriousbroker.it  This test is not yet approved or cleared by the [place]  FDA and has been authorized for detection and/or diagnosis of SARS-CoV-2 by FDA under an Emergency Use Authorization (EUA). This EUA will remain in effect (meaning this test can be used) for the duration of the COVID-19 declaration under Section 564(b)(1) of the Act, 21 U.S.C. section 360bbb-3(b)(1), unless the authorization is terminated or revoked.     Resp Syncytial Virus by PCR NEGATIVE NEGATIVE Final    Comment: (NOTE) Fact Sheet for Patients: bloggercourse.com  Fact Sheet for Healthcare Providers: seriousbroker.it  This test is not yet approved or cleared by the [place]  FDA and has been authorized for detection and/or diagnosis of SARS-CoV-2 by FDA under an Emergency Use Authorization (EUA). This EUA will remain in effect (meaning this test can be used) for the duration of the COVID-19 declaration under Section 564(b)(1) of the Act, 21 U.S.C. section 360bbb-3(b)(1), unless the authorization is terminated or revoked.  Performed at [facility], [address] [place], [place] [address]   MRSA Next Gen by PCR, Nasal     Status: None   Collection Time: 08/22/24  3:47 PM   Specimen: Nasal Mucosa; Nasal Swab  Result Value Ref Range Status   MRSA by PCR Next Gen NOT DETECTED NOT DETECTED Final    Comment: (NOTE) The GeneXpert MRSA Assay (FDA approved for NASAL specimens only), is one component of a comprehensive MRSA colonization surveillance program. It is not intended to diagnose MRSA infection nor to guide or  monitor treatment for MRSA infections. Test performance is not FDA approved in patients less than 19 years old. Performed at [facility], [address], [place], [place] [address]   Body fluid culture w Gram Stain     Status: None   Collection Time: 08/23/24  2:57 PM   Specimen: Lung, Left; Pleural Fluid  Result Value Ref Range Status   Specimen Description   Final    PLEURAL Performed at [facility], [address] [place], [place] [address]    Special Requests   Final    LUNG,LEFT Performed at [facility], [address], [place], [place] [address]    Gram Stain   Final    RARE WBC PRESENT, PREDOMINANTLY PMN NO ORGANISMS SEEN    Culture   Final    NO GROWTH 3 DAYS Performed at [facility], [address], [place], [place] [address]    Report Status 08/27/2024 FINAL  Final     Medications:    apixaban   5 mg Oral BID   budesonide -glycopyrrolate -formoterol   2 puff Inhalation BID   ferrous sulfate   325 mg Oral Q breakfast   insulin  aspart  0-5 Units Subcutaneous QHS   insulin  aspart  0-6 Units Subcutaneous TID WC   levothyroxine   175 mcg Oral Once per day on Monday Tuesday Wednesday Thursday   metoprolol  succinate  50 mg Oral QPM   pantoprazole   40 mg Oral Daily   polyethylene glycol  17 g Oral BID   Ensure Max Protein  11 oz Oral BID BM   Continuous Infusions:    LOS: 26 days   Erle Odell Castor  [facility]  08/29/2024, 9:39 AM

## 2024-08-29 NOTE — Plan of Care (Signed)
" °  Problem: Health Behavior/Discharge Planning: Goal: Ability to manage health-related needs will improve Outcome: Progressing   Problem: Clinical Measurements: Goal: Ability to maintain clinical measurements within normal limits will improve Outcome: Progressing Goal: Will remain free from infection Outcome: Progressing Goal: Diagnostic test results will improve Outcome: Progressing Goal: Respiratory complications will improve Outcome: Progressing Goal: Cardiovascular complication will be avoided Outcome: Progressing   Problem: Activity: Goal: Risk for activity intolerance will decrease Outcome: Progressing   Problem: Nutrition: Goal: Adequate nutrition will be maintained Outcome: Progressing   Problem: Coping: Goal: Level of anxiety will decrease Outcome: Progressing   Problem: Pain Managment: Goal: General experience of comfort will improve and/or be controlled Outcome: Progressing   Problem: Safety: Goal: Ability to remain free from injury will improve Outcome: Progressing   Problem: Skin Integrity: Goal: Risk for impaired skin integrity will decrease Outcome: Progressing   Problem: Education: Goal: Ability to describe self-care measures that may prevent or decrease complications (Diabetes Survival Skills Education) will improve Outcome: Progressing   Problem: Coping: Goal: Ability to adjust to condition or change in health will improve Outcome: Progressing   Problem: Fluid Volume: Goal: Ability to maintain a balanced intake and output will improve Outcome: Progressing   Problem: Health Behavior/Discharge Planning: Goal: Ability to identify and utilize available resources and services will improve Outcome: Progressing Goal: Ability to manage health-related needs will improve Outcome: Progressing   Problem: Metabolic: Goal: Ability to maintain appropriate glucose levels will improve Outcome: Progressing   Problem: Nutritional: Goal: Maintenance of  adequate nutrition will improve Outcome: Progressing Goal: Progress toward achieving an optimal weight will improve Outcome: Progressing   Problem: Skin Integrity: Goal: Risk for impaired skin integrity will decrease Outcome: Progressing   Problem: Tissue Perfusion: Goal: Adequacy of tissue perfusion will improve Outcome: Progressing   "

## 2024-08-30 ENCOUNTER — Other Ambulatory Visit (HOSPITAL_COMMUNITY): Payer: Self-pay

## 2024-08-30 DIAGNOSIS — E860 Dehydration: Secondary | ICD-10-CM | POA: Diagnosis not present

## 2024-08-30 DIAGNOSIS — N179 Acute kidney failure, unspecified: Secondary | ICD-10-CM | POA: Diagnosis not present

## 2024-08-30 DIAGNOSIS — D649 Anemia, unspecified: Secondary | ICD-10-CM | POA: Diagnosis not present

## 2024-08-30 DIAGNOSIS — N1832 Chronic kidney disease, stage 3b: Secondary | ICD-10-CM | POA: Diagnosis not present

## 2024-08-30 LAB — GLUCOSE, CAPILLARY
Glucose-Capillary: 121 mg/dL — ABNORMAL HIGH (ref 70–99)
Glucose-Capillary: 128 mg/dL — ABNORMAL HIGH (ref 70–99)
Glucose-Capillary: 131 mg/dL — ABNORMAL HIGH (ref 70–99)
Glucose-Capillary: 125 mg/dL — ABNORMAL HIGH (ref 70–99)

## 2024-08-30 MED ORDER — APIXABAN 5 MG PO TABS
5.0000 mg | ORAL_TABLET | Freq: Two times a day (BID) | ORAL | 0 refills | Status: AC
  Filled 2024-08-30: qty 60, 30d supply, fill #0

## 2024-08-30 MED ORDER — METOPROLOL SUCCINATE ER 50 MG PO TB24
50.0000 mg | ORAL_TABLET | Freq: Every evening | ORAL | 0 refills | Status: AC
  Filled 2024-08-30: qty 30, 30d supply, fill #0

## 2024-08-30 NOTE — Discharge Summary (Signed)
 Physician Discharge Summary  CONSUELLO LASSALLE FMW:[id number] DOB: [birth year] DOA: 08/03/2024  PCP: [name], FNP  Admit date: 08/03/2024 Discharge date: 08/30/2024  Admitted From: Home Disposition:  Home  Recommendations for Outpatient Follow-up:  Follow up with PCP in 1-2 weeks Please obtain BMP/CBC in one week Please follow up on the following pending results:  Home Health:No Equipment/Devices:Oxygen   Discharge Condition:Hospice CODE STATUS:Full Diet recommendation: Heart Healthy   Brief/Interim Summary:  89 y.o. female past medical history of metastatic non-small cell lung cancer in 2022 with a solitary brain mass found in November 2025, chronic respiratory failure with hypoxia on 3 L of oxygen , essential hypertension presented to the ED on 08/03/2024 for generalized weakness resulting in a fall that has progressed since she started radiation therapy decreased oral intake and urinary frequency along with abdominal pain.  In the ED was found to be in acute kidney injury, hemoglobin of 6 point CT scan of the abdomen and pelvis showed severe left hydronephrosis, urinalysis was compatible with urinary tract infection urology was consulted, however they did not feel stent was needed due to clinical improvement of her renal function.  CT scan of the abdomen pelvis was repeated and showed diffuse diverticular disease of the colon with diverticulitis and fluid collection involving the left side pelvic wall with a 6 cm contained perforation or abscess.  General surgery was consulted and recommended 2 weeks of antibiotics which she completed on 08/17/2024.  Due to developing fevers of 101.5 on 08/21/2024 chest x-ray was done that revealed a left-sided pleural effusion CT of the chest showed interval increase of moderate size left pleural effusion with possible loculation.  Status post thoracocentesis on 08/24/2024.   Discharge Diagnoses:  Principal Problem:   Acute renal failure superimposed on  stage 3b chronic kidney disease (HCC) Active Problems:   Acute cystitis   Diverticulitis of large intestine with abscess   Hydroureteronephrosis   Symptomatic anemia   Acute deep vein thrombosis (DVT) of left lower extremity (HCC)   Malignant neoplasm of unspecified part of unspecified bronchus or lung (HCC)   Goals of care, counseling/discussion   Physical deconditioning   Hyperlipidemia   Hypothyroidism   CKD (chronic kidney disease), stage III (HCC)   Essential hypertension   Coronary artery disease of native artery of native heart with stable angina pectoris   COPD (chronic obstructive pulmonary disease) (HCC)   Chronic iron  deficiency anemia   Non-small cell lung cancer (HCC)   Malignant neoplasm metastatic to brain [facility])   Counseling and coordination of care   Palliative care encounter   Chronic deep vein thrombosis (DVT) of right lower extremity (HCC)   Pleural effusion   Fever greater than 100.4 degrees Fahrenheit   Pressure injury of skin   Weakness   AKI (acute kidney injury)   Non-small cell lung cancer metastatic to brain (HCC)   Acute kidney injury superimposed on stage 3b chronic kidney disease (HCC)  Left-sided pleural effusion with a history of non-small cell carcinoma with a solitary brain met: She completed chemotherapy and immunotherapy about a year ago with Dr. Gatha. CT scan on 08/21/2024 showed moderate left pleural effusion likely malignant due to her advanced disease and complicated disease palliative care was consulted and she was will go home with hospice. She is status post thoracocentesis on 08/24/2024 that you have 700 cc likely malignant. Her family wanted her [facility] skilled nursing facility and they appealed her denial to skilled nursing facility. Her insurance ultimately denied skilled nursing facility placement  she will go home with hospice. Prognosis extremely poor.  Acute kidney injury on chronic kidney disease stage IIIb/with left  hydronephrosis: Urology was consulted who recommended conservative management as her creatinine was improving. Voiding trial was successful she will follow-up with urology as an outpatient.  Acute diverticulitis with abscess: Seen on imaging surgery was consulted they recommended 2 weeks of antibiotics. The abscess was not amenable for draining. Pleated course of antibiotics in house she had no further abdominal pain or fever.  Malignant fever: Infectious etiology was ruled out, lower extremity Doppler was positive for DVT see below further details.  Bilateral lower extremity DVT: He was started on Lovenox  then transition to oral Eliquis  which should continue as an outpatient.  Physical deconditioning: Likely due to malignancy. She will go home with [facility] care.  Chronic respiratory failure with hypoxia: Remains stable on 3 L of oxygen .  Acute on chronic normocytic anemia: On admission hemoglobin 6.8, status post 3 units of packed red blood cells. Hemoglobin is stable..   Essential hypertension: She will go home off antihypertensive medication.   Elevated troponins: Lipitor  aspirin  and Repatha  were discontinued as she will probably gain no benefit from this. She denies any chest pain or shortness of breath. Elevation in troponins likely due to demand ischemia.   History of skin cancer: Follow-up with oncology as an outpatient.   History of recurrent UTIs: Back on Macrobid  as an outpatient.   Discharge Instructions  Discharge Instructions     Increase activity slowly   Complete by: As directed    No wound care   Complete by: As directed       Allergies as of 08/30/2024       Reactions   Codeine Nausea And Vomiting   Norvasc  [amlodipine ] Rash        Medication List     STOP taking these medications    aspirin  EC 81 MG tablet   atorvastatin  80 MG tablet Commonly known as: LIPITOR    dexamethasone  2 MG tablet Commonly known as: DECADRON    estradiol   0.1 MG/GM vaginal cream Commonly known as: ESTRACE    feeding supplement Liqd   fluconazole  100 MG tablet Commonly known as: DIFLUCAN    nitrofurantoin  (macrocrystal-monohydrate) 100 MG capsule Commonly known as: MACROBID    Repatha  SureClick 140 MG/ML Soaj Generic drug: Evolocumab    silver  sulfADIAZINE  1 % cream Commonly known as: SILVADENE    trospium  20 MG tablet Commonly known as: SANCTURA        TAKE these medications    acetaminophen  500 MG tablet Commonly known as: TYLENOL  Take 1,000 mg by mouth every 6 (six) hours as needed (for pain).   albuterol  108 (90 Base) MCG/ACT inhaler Commonly known as: VENTOLIN  HFA TAKE 2 PUFFS BY MOUTH EVERY 6 HOURS AS NEEDED FOR WHEEZE OR SHORTNESS OF BREATH What changed: See the new instructions.   apixaban  5 MG Tabs tablet Commonly known as: ELIQUIS  Take 1 tablet (5 mg total) by mouth 2 (two) times daily.   Breztri  Aerosphere 160-9-4.8 MCG/ACT Aero inhaler Generic drug: budesonide -glycopyrrolate -formoterol  INHALE 2 PUFFS INTO THE LUNGS IN THE MORNING AND ALSO 2 PUFFS AT BEDTIME   ferrous sulfate  325 (65 FE) MG EC tablet TAKE 1 TABLET BY MOUTH EVERY DAY WITH BREAKFAST What changed: See the new instructions.   HYDROcodone -acetaminophen  5-325 MG tablet Commonly known as: NORCO/VICODIN Take 1-2 tablets by mouth every 6 (six) hours as needed for moderate pain (pain score 4-6).   levothyroxine  175 MCG tablet Commonly known as: SYNTHROID  TAKE 1 TABLET  BY MOUTH EVERY MORNING ON MONDAY - FRIDAY What changed: See the new instructions.   metoprolol  succinate 50 MG 24 hr tablet Commonly known as: TOPROL -XL Take 1 tablet (50 mg total) by mouth every evening. Take with or immediately following a meal. What changed: additional instructions   nitrofurantoin  100 MG capsule Commonly known as: MACRODANTIN  Take 100 mg by mouth daily.   nitroGLYCERIN  0.4 MG SL tablet Commonly known as: NITROSTAT  PLACE 1 TABLET UNDER THE TONGUE EVERY 5  (FIVE) MINUTES X 3 DOSES AS NEEDED FOR CHEST PAIN.   OXYGEN  Inhale 3 L/min into the lungs continuous.   polyethylene glycol powder 17 GM/SCOOP powder Commonly known as: GLYCOLAX /MIRALAX  Dissolve 1 capful (17g) in 4-8 ounces of liquid and take by mouth daily as needed for mild constipation.        Contact information for after-discharge care     Destination     [facility], [place] .   Service: Skilled Nursing Contact information: [address] [place]  [phone]             Home Medical Care     [facility]) .   Service: Home Health Services Contact information: [address] [place]  [address] [phone]                    Allergies[1]  Consultations: Pulmonary critical care   Procedures/Studies: [facility] Chest Port 1 View Result Date: 08/23/2024 CLINICAL DATA:  Status post thoracentesis. EXAM: PORTABLE CHEST 1 VIEW COMPARISON:  Chest radiograph dated 08/21/2024. FINDINGS: Decrease in the size of the left pleural effusion. No detectable pneumothorax. The right lung is clear. Stable cardiomegaly. No acute osseous pathology. IMPRESSION: Decrease in the size of the left pleural effusion. No detectable pneumothorax. Electronically Signed   By: Vanetta Chou M.D.   On: 08/23/2024 16:56   IR THORACENTESIS ASP PLEURAL SPACE W/IMG GUIDE Result Date: 08/23/2024 INDICATION: History of metastatic non-small-cell lung cancer. With left pleural effusion. Request for diagnostic and therapeutic left thoracentesis. EXAM: ULTRASOUND GUIDED DIAGNOSTIC AND THERAPEUTIC THORACENTESIS MEDICATIONS: 10 mL 1% lidocaine  COMPLICATIONS: None immediate. PROCEDURE: An ultrasound guided thoracentesis was thoroughly discussed with the patient and questions answered. The benefits, risks, alternatives and complications were also discussed. The patient understands and wishes to proceed with the  procedure. Written consent was obtained. Ultrasound was performed to localize and mark an adequate pocket of fluid in the left chest. The area was then prepped and draped in the normal sterile fashion. 1% Lidocaine  was used for local anesthesia. Under ultrasound guidance a 6 Fr Safe-T-Centesis catheter was introduced. Thoracentesis was performed. The catheter was removed and a dressing applied. FINDINGS: A total of approximately 700 mL of hazy orange fluid was removed. Samples were sent to the laboratory as requested by the clinical team. IMPRESSION: Successful ultrasound guided left thoracentesis yielding 700 mL of pleural fluid. Performed and dictated by Kimble Clas, PA-C Electronically Signed   By: Ester Sides M.D.   On: 08/23/2024 15:20   CT CHEST WO CONTRAST Result Date: 08/22/2024 EXAM: CT CHEST WITHOUT CONTRAST 08/21/2024 12:17:42 PM TECHNIQUE: CT of the chest was performed without the administration of intravenous contrast. Multiplanar reformatted images are provided for review. Automated exposure control, iterative reconstruction, and/or weight based adjustment of the mA/kV was utilized to reduce the radiation dose to as low as reasonably achievable. COMPARISON: Portable chest from today, portable chest from 06/06/2024, chest CTs without contrast 06/07/2024 and 04/04/2024,  and CTA chest 10/11/2023. CLINICAL HISTORY: pleural effusion Prior left-sided lung cancer, post-treatment. FINDINGS: MEDIASTINUM: Mild cardiomegaly. Small chronic pericardial effusion. The coronary arteries are heavily calcified. The aorta is heavily calcified. There is mild aortic tortuosity without aneurysm. Moderate calcific plaques in the great vessels. There is a prominent pulmonary trunk measuring 3.4 cm indicating pulmonary arterial hypertension. Pulmonary veins are nondistended. . The central airways are clear. The trachea is patent. There is a moderate to large-sized hiatal hernia. LYMPH NODES: No intrathoracic adenopathy  is seen Without contrast; assessment for hilar adenopathy is limited on the left greater than right. LUNGS AND PLEURA: There is a moderate-sized left pleural effusion, interval increased. Some of the fluid extends subpulmonic and could be partially loculated. . Most of it layers posteriorly. The fluid is low density, 9 to 14 HU, and appears uncomplicated. There is a minimal right pleural effusion, new since the prior study, with adjacent atelectasis in the lower lobe. There is compressive collapse or consolidation of the left lower lobe basal segments and basal lingula. Post-treatment changes on the left similar in appearance to the prior studies although it would be better demonstrated with IV contrast. There is left upper lobe paramediastinal fibrosis with consolidation and air bronchograms with contiguous extension into the medial aspect of the superior segment of the left lower lobe, with architectural distortion and volume loss with linear scarring in the adjacent left upper lobe. Mild posterior atelectasis in the lungs. Lungs are mildly emphysematous with centrilobular change predominating. The remaining right lung is clear. No pulmonary edema. No pneumothorax. SOFT TISSUES/BONES: Osteopenia, kypholevoscoliosis and advanced degenerative change of the thoracic spine with multilevel interbody ankylosis. No bone metastasis is seen. There is a chronic healed fracture of the proximal left humerus. There is moderate stranding and patchy subcutaneous fluid in the lower lateral left chest wall, question whether due to third spacing, local trauma, or cellulitis. UPPER ABDOMEN: There is increased left hydronephrosis new from the last CT and only partially assessed ; further evaluation recommended. Small left renal cysts including hyperdense cysts are again shown. Limited images of the upper abdomen demonstrates no other acute abnormality. IMPRESSION: 1. Interval increased moderate-sized left pleural effusion with  possible partial loculation inferiorly . 2. Compressive collapse or consolidation of the left lower lobe basal segments and basal lingula. 3. New minimal right pleural effusion with adjacent atelectasis in the lower lobe. 4. Moderate stranding and patchy subcutaneous fluid in the lower lateral left chest wall. Etiology indeterminate . 5. Post-treatment changes in the left upper lobe with paramediastinal fibrosis, consolidation and air bronchograms, with contiguous extension into the medial aspect of the superior segment of the left lower lobe. 6. Increased left hydronephrosis, new from the last CT, further evaluation recommended. 7. Prominent pulmonary trunk measuring 3.4 cm, consistent with pulmonary arterial hypertension. 8. No bone metastasis is seen. Electronically signed by: Francis Quam MD 08/22/2024 01:16 AM EST RP Workstation: [id number]   DG CHEST PORT 1 VIEW Result Date: 08/21/2024 CLINICAL DATA:  Fever.  Fatigue.  History of lung cancer. EXAM: PORTABLE CHEST 1 VIEW COMPARISON:  Radiograph 06/06/2024, CT 06/07/2024 FINDINGS: Left lung volume loss with ill-defined perihilar opacity. Progressive retrocardiac opacity which may be airspace disease or pleural effusion. Bronchial thickening throughout the right lung has progressed from prior exam. No pneumothorax. IMPRESSION: 1. Left lung volume loss with ill-defined perihilar opacity, likely related to known lung cancer. 2. Progressive retrocardiac opacity which may be airspace disease or pleural effusion. 3. Progressive bronchial thickening throughout the right  lung. Electronically Signed   By: Andrea Gasman M.D.   On: 08/21/2024 15:50   US  RENAL Result Date: 08/11/2024 EXAM: US  Retroperitoneum Complete, Renal. 08/11/2024 07:23:00 PM TECHNIQUE: Real-time ultrasonography of the retroperitoneum renal was performed. COMPARISON: Ultrasound 08/06/2024 CLINICAL HISTORY: 6300 Hydronephrosis 6300 FINDINGS: FINDINGS: RIGHT KIDNEY/URETER: Right kidney  measures 10.0 x 4.4 x 4.4 cm. The calculated volume is 101 ml. A 1.8 cm simple appearing cyst is noted within the mid portion of the right kidney. Increased echogenicity is noted consistent with medical renal disease. No hydronephrosis. LEFT KIDNEY/URETER: Left kidney measures 10.1 x 4.6 x 6.1 cm. Calculated volume is 147 ml. Hydronephrosis is noted of a moderate to severe degree similar to that seen on the prior examination. Increased echogenicity is noted, consistent with medical renal disease. BLADDER: Bladder is decompressed. IMPRESSION: 1. Moderate to severe left hydronephrosis, similar to prior exam. 2. Increased echogenicity in both kidneys, consistent with medical renal disease. Electronically signed by: Oneil Devonshire MD 08/11/2024 10:37 PM EST RP Workstation: [id number]   VAS US  LOWER EXTREMITY VENOUS (DVT) Result Date: 08/08/2024  Lower Venous DVT Study Patient Name:  IYARI HAGNER  Date of Exam:   08/08/2024 Medical Rec #: [id number]     Accession #:    [id number] Date of Birth: September 06, 1935     Patient Gender: F Patient Age:   59 years Exam Location:  [facility] Procedure:      VAS US  LOWER EXTREMITY VENOUS (DVT) Referring Phys: LEATRICE OGBATA --------------------------------------------------------------------------------  Indications: Edema. Other Indications: Severe left-sided hydroureteronephrosis. Risk Factors: Cancer Metastatic non-small lung cancer to brain, on radiation. Recent surgery for skin cancer on left calf. Limitations: Poor ultrasound/tissue interface and Edema, bowel gas. Comparison Study: No prior study on file Performing Technologist: Alberta Lis RVS  Examination Guidelines: A complete evaluation includes B-mode imaging, spectral Doppler, color Doppler, and power Doppler as needed of all accessible portions of each vessel. Bilateral testing is considered an integral part of a complete examination. Limited examinations for reoccurring indications may be performed as noted.  The reflux portion of the exam is performed with the patient in reverse Trendelenburg.  +---------+---------------+---------+-----------+----------+-----------------+ RIGHT    CompressibilityPhasicitySpontaneityPropertiesThrombus Aging    +---------+---------------+---------+-----------+----------+-----------------+ CFV      Full           Yes      Yes                                    +---------+---------------+---------+-----------+----------+-----------------+ SFJ      Full                                                           +---------+---------------+---------+-----------+----------+-----------------+ FV Prox  Full           Yes      Yes                                    +---------+---------------+---------+-----------+----------+-----------------+ FV Mid   Full                                                           +---------+---------------+---------+-----------+----------+-----------------+  FV DistalFull                                                           +---------+---------------+---------+-----------+----------+-----------------+ PFV      Full           Yes      Yes                                    +---------+---------------+---------+-----------+----------+-----------------+ POP      None           No       No                   Age Indeterminate +---------+---------------+---------+-----------+----------+-----------------+ PTV      None                                         Age Indeterminate +---------+---------------+---------+-----------+----------+-----------------+ PERO     None                                         Age Indeterminate +---------+---------------+---------+-----------+----------+-----------------+   +---------+---------------+---------+-----------+----------+-------------------+ LEFT     CompressibilityPhasicitySpontaneityPropertiesThrombus Aging       +---------+---------------+---------+-----------+----------+-------------------+ CFV      None           No       No                   Acute               +---------+---------------+---------+-----------+----------+-------------------+ SFJ      None           No       No                   Acute               +---------+---------------+---------+-----------+----------+-------------------+ FV Prox  None           No       No                   Acute               +---------+---------------+---------+-----------+----------+-------------------+ FV Mid   None                                         Acute               +---------+---------------+---------+-----------+----------+-------------------+ FV DistalNone                                         Acute               +---------+---------------+---------+-----------+----------+-------------------+ PFV      None  Acute               +---------+---------------+---------+-----------+----------+-------------------+ POP      None           No       No                   Acute               +---------+---------------+---------+-----------+----------+-------------------+ PTV      None                                         Acute               +---------+---------------+---------+-----------+----------+-------------------+ PERO     None                                         Acute               +---------+---------------+---------+-----------+----------+-------------------+ Soleal   None                                         Acute               +---------+---------------+---------+-----------+----------+-------------------+ Gastroc  None                                         Acute               +---------+---------------+---------+-----------+----------+-------------------+ GSV      None                                         Acute in proximal                                                          portion             +---------+---------------+---------+-----------+----------+-------------------+ EIV                                                   Acute               +---------+---------------+---------+-----------+----------+-------------------+ IVC                                                   patent              +---------+---------------+---------+-----------+----------+-------------------+ Unable to visualized the common iliac veins secondary to bowel gas    Summary: RIGHT: - Findings consistent with age indeterminate deep vein thrombosis involving the  right popliteal vein, right posterior tibial veins, and right peroneal veins.   LEFT: - Findings consistent with acute deep vein thrombosis involving the left common femoral vein, SF junction, left femoral vein, left proximal profunda vein, left popliteal vein, left posterior tibial veins, left peroneal veins, and External iliac vein. - Findings consistent with acute superficial vein thrombosis involving the left great saphenous vein. Findings consistent with acute intramuscular thrombosis involving the left gastrocnemius veins, and left soleal veins.  *See table(s) above for measurements and observations. Electronically signed by Fonda Rim on 08/08/2024 at 5:50:01 PM.    Final    DG Abd Portable 1V Result Date: 08/06/2024 CLINICAL DATA:  [id number] Nausea AND vomiting [id number] EXAM: PORTABLE ABDOMEN - 1 VIEW COMPARISON:  August 04, 2024 FINDINGS: Air and stool filled nondilated loops of bowel. Enteric contrast delineates predominantly bowel loops within the RIGHT hemicolon and transverse colon. Air is visualized in the rectum. Degenerative changes of the lumbar spine. IMPRESSION: Nonobstructive bowel gas pattern. Known colonic perforation is not visualized radiographically. Electronically Signed   By: Corean Salter M.D.   On: 08/06/2024 19:15   US  RENAL Result Date:  08/06/2024 CLINICAL DATA:  [id number] Hydronephrosis of left kidney [id number] EXAM: RENAL / URINARY TRACT ULTRASOUND COMPLETE COMPARISON:  August 04, 2024 FINDINGS: Right Kidney: Renal measurements: 10.1 x 4.6 x 4.8 cm = volume: 114 mL. Echogenicity is mildly increased. No hydronephrosis visualized. Benign cyst is noted measuring 14 mm (for which no dedicated imaging follow-up is recommended). Left Kidney: Renal measurements: 11.7 x 5.7 x 4.7 cm = volume: 163 mL. Echogenicity is increased. There is severe hydronephrosis. Bladder: Not visualized secondary to shadowing bowel gas Other: None. IMPRESSION: 1. Severe left hydronephrosis. 2. Increased echogenicity of the kidneys as can be seen in medical renal disease. Electronically Signed   By: Corean Salter M.D.   On: 08/06/2024 18:07   CT ABDOMEN PELVIS WO CONTRAST Result Date: 08/04/2024 CLINICAL DATA:  Left lower quadrant pain EXAM: CT ABDOMEN AND PELVIS WITHOUT CONTRAST TECHNIQUE: Multidetector CT imaging of the abdomen and pelvis was performed following the standard protocol without IV contrast. RADIATION DOSE REDUCTION: This exam was performed according to the departmental dose-optimization program which includes automated exposure control, adjustment of the mA and/or kV according to patient size and/or use of iterative reconstruction technique. COMPARISON:  CT 06/06/2024, 10/29/2023, 01/30/2024 FINDINGS: Lower chest: Advanced aortic atherosclerosis. Coronary vascular calcification. Cardiomegaly. Moderate to large hiatal hernia. Partially imaged slightly loculated appearing small moderate left pleural effusion. Mild subpleural atelectasis or minimal aspiration in the posterior right lung base. Hepatobiliary: No focal liver abnormality is seen. No gallstones, gallbladder wall thickening, or biliary dilatation. Pancreas: Unremarkable. No pancreatic ductal dilatation or surrounding inflammatory changes. Spleen: Normal in size without focal abnormality.  Adrenals/Urinary Tract: Adrenal glands are normal. Mild cortical atrophy of the left kidney. Multiple cysts and cortical hyperdensities, limited characterization without contrast but better characterized on prior MRI from 2024, no specific imaging follow-up recommended. 10 mm stone within the mid left kidney with additional smaller left kidney stones. Interim development of moderate severe left hydronephrosis and hydroureter, ureter dilated to the level of the pelvis, the distal ureter proximal to the bladder is decompressed. Bladder is unremarkable. Stomach/Bowel: Stomach is nondistended. No dilated small bowel. Diffuse diverticular disease of the colon. Focal wall thickening and mild inflammatory change at the sigmoid colon. New gas and fluid collection along the left pelvic sidewall, estimated measurement of 5.9 x 2.3 cm on series 2, image 63. Further assessment  limited without contrast. Smaller gas and fluid collection in the left inferior pelvis, series 2, image 68. Vascular/Lymphatic: Aortic atherosclerosis. No enlarged abdominal or pelvic lymph nodes. Reproductive: Negative for adnexal mass Other: No ascites.  Small fat containing umbilical hernia Musculoskeletal: Scoliosis and degenerative changes. No acute osseous abnormality. IMPRESSION: 1. Diffuse diverticular disease of the colon. Focal wall thickening and mild inflammatory change at the sigmoid colon, probably representing diverticulitis with colon mass not excluded. New gas and fluid collection along the left pelvic sidewall measuring up to 5.9 cm, suspicious for focal/contained perforation or abscess, assessment limited in the absence of intravenous contrast. Smaller gas and fluid collection in the left inferior pelvis, this is indeterminate for an additional gas and fluid collection versus fluid containing inflamed large diverticulum. 2. Interim development of moderate to severe left hydronephrosis and hydroureter, ureter dilated to the level of the  pelvis/area of inflammatory change at sigmoid colon. Suspect obstruction of the distal left ureter by the colon inflammatory changes and gas and fluid collection. 3. Partially imaged slightly loculated appearing small to moderate left pleural effusion. Mild subpleural atelectasis or minimal aspiration in the posterior right lung base. 4. Moderate to large hiatal hernia. 5. Aortic atherosclerosis. These results will be called to the ordering clinician or representative by the Radiologist Assistant, and communication documented in the PACS or [employer]. Aortic Atherosclerosis (ICD10-I70.0). Electronically Signed   By: Luke Bun M.D.   On: 08/04/2024 22:20   (Echo, Carotid, EGD, Colonoscopy, ERCP)    Subjective: No complaints  Discharge Exam: Vitals:   08/30/24 0629 08/30/24 1005  BP: (!) 105/59   Pulse: 79   Resp: 18   Temp: 98.6 F (37 C)   SpO2: 98% 98%   Vitals:   08/29/24 1701 08/29/24 2303 08/30/24 0629 08/30/24 1005  BP: (!) 166/76 131/62 (!) 105/59   Pulse: 84 72 79   Resp:  18 18   Temp:  98 F (36.7 C) 98.6 F (37 C)   TempSrc:  Oral Oral   SpO2: 98% 94% 98% 98%  Weight:      Height:        General: Pt is alert, awake, not in acute distress Cardiovascular: RRR, S1/S2 +, no rubs, no gallops Respiratory: CTA bilaterally, no wheezing, no rhonchi Abdominal: Soft, NT, ND, bowel sounds + Extremities: no edema, no cyanosis    The results of significant diagnostics from this hospitalization (including imaging, microbiology, ancillary and laboratory) are listed below for reference.     Microbiology: Recent Results (from the past 240 hours)  Resp panel by RT-PCR (RSV, Flu A&B, Covid) Anterior Nasal Swab     Status: None   Collection Time: 08/21/24  8:20 AM   Specimen: Anterior Nasal Swab  Result Value Ref Range Status   SARS Coronavirus 2 by RT PCR NEGATIVE NEGATIVE Final    Comment: (NOTE) SARS-CoV-2 target nucleic acids are NOT DETECTED.  The SARS-CoV-2  RNA is generally detectable in upper respiratory specimens during the acute phase of infection. The lowest concentration of SARS-CoV-2 viral copies this assay can detect is 138 copies/mL. A negative result does not preclude SARS-Cov-2 infection and should not be used as the sole basis for treatment or other patient management decisions. A negative result may occur with  improper specimen collection/handling, submission of specimen other than nasopharyngeal swab, presence of viral mutation(s) within the areas targeted by this assay, and inadequate number of viral copies(<138 copies/mL). A negative result must be combined with clinical observations, patient  history, and epidemiological information. The expected result is Negative.  Fact Sheet for Patients:  bloggercourse.com  Fact Sheet for Healthcare Providers:  seriousbroker.it  This test is no t yet approved or cleared by the [place]  FDA and  has been authorized for detection and/or diagnosis of SARS-CoV-2 by FDA under an Emergency Use Authorization (EUA). This EUA will remain  in effect (meaning this test can be used) for the duration of the COVID-19 declaration under Section 564(b)(1) of the Act, 21 U.S.C.section 360bbb-3(b)(1), unless the authorization is terminated  or revoked sooner.       Influenza A by PCR NEGATIVE NEGATIVE Final   Influenza B by PCR NEGATIVE NEGATIVE Final    Comment: (NOTE) The Xpert Xpress SARS-CoV-2/FLU/RSV plus assay is intended as an aid in the diagnosis of influenza from Nasopharyngeal swab specimens and should not be used as a sole basis for treatment. Nasal washings and aspirates are unacceptable for Xpert Xpress SARS-CoV-2/FLU/RSV testing.  Fact Sheet for Patients: bloggercourse.com  Fact Sheet for Healthcare Providers: seriousbroker.it  This test is not yet approved or cleared by the  [place]  FDA and has been authorized for detection and/or diagnosis of SARS-CoV-2 by FDA under an Emergency Use Authorization (EUA). This EUA will remain in effect (meaning this test can be used) for the duration of the COVID-19 declaration under Section 564(b)(1) of the Act, 21 U.S.C. section 360bbb-3(b)(1), unless the authorization is terminated or revoked.     Resp Syncytial Virus by PCR NEGATIVE NEGATIVE Final    Comment: (NOTE) Fact Sheet for Patients: bloggercourse.com  Fact Sheet for Healthcare Providers: seriousbroker.it  This test is not yet approved or cleared by the [place]  FDA and has been authorized for detection and/or diagnosis of SARS-CoV-2 by FDA under an Emergency Use Authorization (EUA). This EUA will remain in effect (meaning this test can be used) for the duration of the COVID-19 declaration under Section 564(b)(1) of the Act, 21 U.S.C. section 360bbb-3(b)(1), unless the authorization is terminated or revoked.  Performed at [facility], [address], [place], [place] [address]   MRSA Next Gen by PCR, Nasal     Status: None   Collection Time: 08/22/24  3:47 PM   Specimen: Nasal Mucosa; Nasal Swab  Result Value Ref Range Status   MRSA by PCR Next Gen NOT DETECTED NOT DETECTED Final    Comment: (NOTE) The GeneXpert MRSA Assay (FDA approved for NASAL specimens only), is one component of a comprehensive MRSA colonization surveillance program. It is not intended to diagnose MRSA infection nor to guide or monitor treatment for MRSA infections. Test performance is not FDA approved in patients less than 33 years old. Performed at [facility], [address], [place], [place] [address]   Body fluid culture w Gram Stain     Status: None   Collection Time: 08/23/24  2:57 PM   Specimen: Lung, Left; Pleural Fluid  Result Value Ref Range Status   Specimen  Description   Final    PLEURAL Performed at [facility], [address], [place], [place] [address]    Special Requests   Final    LUNG,LEFT Performed at [facility], [address] [place], [place] [address]    Gram Stain   Final    RARE WBC PRESENT, PREDOMINANTLY PMN NO ORGANISMS SEEN    Culture   Final    NO GROWTH 3 DAYS Performed at [facility], [address], [place], [place] [address]    Report Status 08/27/2024 FINAL  Final     Labs: BNP (last 3 results) Recent Labs    10/11/23 0655  BNP 261.1*   Basic Metabolic Panel: Recent Labs  Lab 08/24/24 0400  NA 136  K 4.7  CL 105  CO2 24  GLUCOSE 101*  BUN 32*  CREATININE 1.61*  CALCIUM  8.1*   Liver Function Tests: Recent Labs  Lab 08/24/24 0400  AST 17  ALT 8  ALKPHOS 102  BILITOT 0.2  PROT 5.3*  ALBUMIN  2.4*   No results for input(s): LIPASE, AMYLASE in the last 168 hours. No results for input(s): AMMONIA in the last 168 hours. CBC: Recent Labs  Lab 08/24/24 0400 08/25/24 0407 08/25/24 1925 08/26/24 0318  WBC 7.6 8.6 11.0* 10.8*  HGB 7.0* 7.0* 9.1* 9.1*  HCT 23.1* 23.2* 29.3* 29.3*  MCV 90.9 89.9 90.2 90.2  PLT 322 328 339 315   Cardiac Enzymes: No results for input(s): CKTOTAL, CKMB, CKMBINDEX, TROPONINI in the last 168 hours. BNP: Invalid input(s): POCBNP CBG: Recent Labs  Lab 08/29/24 0743 08/29/24 1157 08/29/24 1657 08/29/24 2305 08/30/24 0729  GLUCAP 108* 137* 131* 134* 121*   D-Dimer No results for input(s): DDIMER in the last 72 hours. Hgb A1c No results for input(s): HGBA1C in the last 72 hours. Lipid Profile No results for input(s): CHOL, HDL, LDLCALC, TRIG, CHOLHDL, LDLDIRECT in the last 72 hours. Thyroid  function studies No results for input(s): TSH, T4TOTAL, T3FREE, THYROIDAB in the last 72 hours.  Invalid input(s): FREET3 Anemia work up No results for input(s):  VITAMINB12, FOLATE, FERRITIN, TIBC, IRON , RETICCTPCT in the last 72 hours. Urinalysis    Component Value Date/Time   COLORURINE AMBER (A) 08/03/2024 1228   APPEARANCEUR CLOUDY (A) 08/03/2024 1228   LABSPEC 1.014 08/03/2024 1228   PHURINE 5.0 08/03/2024 1228   GLUCOSEU NEGATIVE 08/03/2024 1228   HGBUR SMALL (A) 08/03/2024 1228   BILIRUBINUR NEGATIVE 08/03/2024 1228   BILIRUBINUR negative 05/19/2024 1427   BILIRUBINUR Negative 04/07/2023 1226   KETONESUR NEGATIVE 08/03/2024 1228   PROTEINUR 30 (A) 08/03/2024 1228   UROBILINOGEN 0.2 05/19/2024 1427   UROBILINOGEN 0.2 04/05/2012 1606   NITRITE NEGATIVE 08/03/2024 1228   LEUKOCYTESUR LARGE (A) 08/03/2024 1228   Sepsis Labs Recent Labs  Lab 08/24/24 0400 08/25/24 0407 08/25/24 1925 08/26/24 0318  WBC 7.6 8.6 11.0* 10.8*   Microbiology Recent Results (from the past 240 hours)  Resp panel by RT-PCR (RSV, Flu A&B, Covid) Anterior Nasal Swab     Status: None   Collection Time: 08/21/24  8:20 AM   Specimen: Anterior Nasal Swab  Result Value Ref Range Status   SARS Coronavirus 2 by RT PCR NEGATIVE NEGATIVE Final    Comment: (NOTE) SARS-CoV-2 target nucleic acids are NOT DETECTED.  The SARS-CoV-2 RNA is generally detectable in upper respiratory specimens during the acute phase of infection. The lowest concentration of SARS-CoV-2 viral copies this assay can detect is 138 copies/mL. A negative result does not preclude SARS-Cov-2 infection and should not be used as the sole basis for treatment or other patient management decisions. A negative result may occur with  improper specimen collection/handling, submission of specimen other than nasopharyngeal swab, presence of viral mutation(s) within the areas targeted by this assay, and inadequate number of viral copies(<138 copies/mL). A negative result must be combined with clinical observations, patient history, and epidemiological information. The expected result is  Negative.  Fact Sheet for Patients:  bloggercourse.com  Fact  Sheet for Healthcare Providers:  seriousbroker.it  This test is no t yet approved or cleared by the [place]  FDA and  has been authorized for detection and/or diagnosis of SARS-CoV-2 by FDA under an Emergency Use Authorization (EUA). This EUA will remain  in effect (meaning this test can be used) for the duration of the COVID-19 declaration under Section 564(b)(1) of the Act, 21 U.S.C.section 360bbb-3(b)(1), unless the authorization is terminated  or revoked sooner.       Influenza A by PCR NEGATIVE NEGATIVE Final   Influenza B by PCR NEGATIVE NEGATIVE Final    Comment: (NOTE) The Xpert Xpress SARS-CoV-2/FLU/RSV plus assay is intended as an aid in the diagnosis of influenza from Nasopharyngeal swab specimens and should not be used as a sole basis for treatment. Nasal washings and aspirates are unacceptable for Xpert Xpress SARS-CoV-2/FLU/RSV testing.  Fact Sheet for Patients: bloggercourse.com  Fact Sheet for Healthcare Providers: seriousbroker.it  This test is not yet approved or cleared by the [place]  FDA and has been authorized for detection and/or diagnosis of SARS-CoV-2 by FDA under an Emergency Use Authorization (EUA). This EUA will remain in effect (meaning this test can be used) for the duration of the COVID-19 declaration under Section 564(b)(1) of the Act, 21 U.S.C. section 360bbb-3(b)(1), unless the authorization is terminated or revoked.     Resp Syncytial Virus by PCR NEGATIVE NEGATIVE Final    Comment: (NOTE) Fact Sheet for Patients: bloggercourse.com  Fact Sheet for Healthcare Providers: seriousbroker.it  This test is not yet approved or cleared by the [place]  FDA and has been authorized for detection and/or diagnosis of  SARS-CoV-2 by FDA under an Emergency Use Authorization (EUA). This EUA will remain in effect (meaning this test can be used) for the duration of the COVID-19 declaration under Section 564(b)(1) of the Act, 21 U.S.C. section 360bbb-3(b)(1), unless the authorization is terminated or revoked.  Performed at [facility], [address], [place], [place] [address]   MRSA Next Gen by PCR, Nasal     Status: None   Collection Time: 08/22/24  3:47 PM   Specimen: Nasal Mucosa; Nasal Swab  Result Value Ref Range Status   MRSA by PCR Next Gen NOT DETECTED NOT DETECTED Final    Comment: (NOTE) The GeneXpert MRSA Assay (FDA approved for NASAL specimens only), is one component of a comprehensive MRSA colonization surveillance program. It is not intended to diagnose MRSA infection nor to guide or monitor treatment for MRSA infections. Test performance is not FDA approved in patients less than 69 years old. Performed at [facility], [address], [place], [place] [address]   Body fluid culture w Gram Stain     Status: None   Collection Time: 08/23/24  2:57 PM   Specimen: Lung, Left; Pleural Fluid  Result Value Ref Range Status   Specimen Description   Final    PLEURAL Performed at [facility], [address], [place], [place] [address]    Special Requests   Final    LUNG,LEFT Performed at [facility], [address] [place], [place] [address]    Gram Stain   Final    RARE WBC PRESENT, PREDOMINANTLY PMN NO ORGANISMS SEEN    Culture   Final    NO GROWTH 3 DAYS Performed at [facility], [address], [place], [place] [address]    Report Status 08/27/2024 FINAL  Final     Time coordinating  discharge: Over 35 minutes  SIGNED:   Erle Odell Castor, MD  [facility] 08/30/2024, 11:12 AM Pager   If 7PM-7AM, please contact night-coverage [email or web address] Password [email or web address]     [1]   Allergies Allergen Reactions   Codeine Nausea And Vomiting   Norvasc  [Amlodipine ] Rash

## 2024-08-30 NOTE — Care Management Important Message (Signed)
 Important Message  Patient Details IM Letter given. Name: Abigail [name] MRN: [id number] Date of Birth: [birth year]   Important Message Given:  Yes - Medicare IM     Melba Ates 08/30/2024, 3:20 PM

## 2024-08-30 NOTE — TOC Progression Note (Addendum)
 Transition of Care [facility]) - Progression Note    Patient Details  Name: Abigail [name] MRN: [id number] Date of Birth: [birth year]  Transition of [facility]) CM/SW Contact  [name], RN Phone Number: 08/30/2024, 3:41 PM  Clinical Narrative:  -2:07pm NCM called to patient's daughter, Burnard, to introduce self and inform patient has dc to home order. NCM confirmed with Burnard that family is agreeable to home hospice with [facility]. Burnard confirmed her brother, [name], resides with the patient but he works long hours, 16-18 hrs /day and that she resides in [place] . She also confirmed they she nor her brother have the financial resources for private duty care givers. Burnard requested assistance with application for Medicaid. NCM sent referral to [facility] requesting outreach to patient to assist with Medicaid application. NCM discussed need for contact to arrange home delivery of home equipment by [facility]. Kelly directed NCM to [place], patient's cousin, for discussion of dc planning.   -2:54pm NCM called to pt's cousin, Heron, introduced self and role of INPT CM. I explained that I had spoke with Burnard and that she directed me to her for assistance with dc planning. Heron states to there knowledge the application for Medicaid has not been initiated and that due to her medical conditions she feels like she would not be able to assist the patient. She states that patient resides with her son and that he would be the best contact for assistance with applying for Medicaid as he would have knowledge of her resources. NCM informed Heron that patient has dc order and inquired she would be able to provide some caregiver assistance to the patient as her son works longs hours and her daughter resides in [place] . Heron states she has she has difficulty with her mobility due to MS and that she does not reside close to the patient, therefore she is unsure of how much she would be of assistance  or if at all. Heron directed the NCM to pt's son, [name], to discuss dc planning   -3:14pm NCM called to patient's son, [name], introduced self and role of INPT CM. NCM  reviewed with [name] that patient is medically stable and a dc to home order has been entered. Informed him that home hospice has been arranged with [facility] and that they will be contacting him to arrange for home equipment delivery, [name] voiced understanding. NCM  discussed home care arrangement for patient upon discharge. [name] reports he works long hours and at this time the family has made no  arrangements for home care for the patient as she is not mobile and unable to make meals or perform her own ADL's. NCM discussed that patient is ready for discharge and we need the family to discuss and come up with a plan for patient's care at home. [name] states he will reach out to his sister tonight for a family discussion. NCM will follow up tomorrow. NCM contacted Matt w/[employer] to confirmed referral was received. Adina states he will follow up and contact NCM.   -4:45pm Met with patient at bedside to inform of Medicare right to Appeal, patient requested NCM reach out to her daughter . NCM called to pt's dtr, Burnard, explained Medical Right to Appeal discharge. NCM offer option for email or fax for to [employer]. Burnard states she wasn't making a decision on Appeal at this time. NCM completed form, copy of  Medicare HINN-12 and Detailed Notice of Discharge left at patient's bedside.  Expected Discharge Plan: Home w Home Health Services Barriers to Discharge: Continued Medical Work up               Expected Discharge Plan and Services       Living arrangements for the past 2 months: Single Family Home Expected Discharge Date: 08/30/24                                     Social Drivers of Health (SDOH) Interventions SDOH Screenings   Food Insecurity: No Food Insecurity (08/03/2024)  Housing: Low Risk  (08/03/2024)  Transportation Needs: No Transportation Needs (08/03/2024)  Utilities: Not At Risk (08/03/2024)  Alcohol Screen: Low Risk (03/09/2024)  Depression (PHQ2-9): Low Risk (07/01/2024)  Financial Resource Strain: Medium Risk (03/09/2024)  Physical Activity: Inactive (03/09/2024)  Social Connections: Moderately Isolated (08/03/2024)  Stress: No Stress Concern Present (03/09/2024)  Recent Concern: Stress - Stress Concern Present (03/09/2024)  Tobacco Use: Medium Risk (08/05/2024)  Health Literacy: Adequate Health Literacy (03/09/2024)    Readmission Risk Interventions    08/23/2024    1:08 PM 06/07/2024    3:57 PM  Readmission Risk Prevention Plan  Transportation Screening Complete Complete  PCP or Specialist Appt within 3-5 Days  Complete  HRI or Home Care Consult  Complete  Social Work Consult for Recovery Care Planning/Counseling  Complete  Palliative Care Screening  Complete  Medication Review [employer]) Complete Referral to Pharmacy  PCP or Specialist appointment within 3-5 days of discharge Complete   HRI or Home Care Consult Complete   SW Recovery Care/Counseling Consult Complete   Palliative Care Screening Not Applicable   Skilled Nursing Facility Complete

## 2024-08-30 NOTE — Plan of Care (Signed)
" °  Problem: Health Behavior/Discharge Planning: Goal: Ability to manage health-related needs will improve Outcome: Progressing   Problem: Clinical Measurements: Goal: Ability to maintain clinical measurements within normal limits will improve Outcome: Progressing Goal: Will remain free from infection Outcome: Progressing Goal: Diagnostic test results will improve Outcome: Progressing Goal: Respiratory complications will improve Outcome: Progressing Goal: Cardiovascular complication will be avoided Outcome: Progressing   Problem: Activity: Goal: Risk for activity intolerance will decrease Outcome: Progressing   Problem: Nutrition: Goal: Adequate nutrition will be maintained Outcome: Progressing   Problem: Coping: Goal: Level of anxiety will decrease Outcome: Progressing   Problem: Pain Managment: Goal: General experience of comfort will improve and/or be controlled Outcome: Progressing   Problem: Safety: Goal: Ability to remain free from injury will improve Outcome: Progressing   Problem: Skin Integrity: Goal: Risk for impaired skin integrity will decrease Outcome: Progressing   Problem: Education: Goal: Ability to describe self-care measures that may prevent or decrease complications (Diabetes Survival Skills Education) will improve Outcome: Progressing   Problem: Coping: Goal: Ability to adjust to condition or change in health will improve Outcome: Progressing   Problem: Fluid Volume: Goal: Ability to maintain a balanced intake and output will improve Outcome: Progressing   Problem: Health Behavior/Discharge Planning: Goal: Ability to identify and utilize available resources and services will improve Outcome: Progressing Goal: Ability to manage health-related needs will improve Outcome: Progressing   Problem: Metabolic: Goal: Ability to maintain appropriate glucose levels will improve Outcome: Progressing   Problem: Nutritional: Goal: Maintenance of  adequate nutrition will improve Outcome: Progressing Goal: Progress toward achieving an optimal weight will improve Outcome: Progressing   Problem: Skin Integrity: Goal: Risk for impaired skin integrity will decrease Outcome: Progressing   Problem: Tissue Perfusion: Goal: Adequacy of tissue perfusion will improve Outcome: Progressing   "

## 2024-08-30 NOTE — Progress Notes (Signed)
 Discharge medications delivered to patient at the bedside in a secure bag.

## 2024-08-31 DIAGNOSIS — N1832 Chronic kidney disease, stage 3b: Secondary | ICD-10-CM | POA: Diagnosis not present

## 2024-08-31 DIAGNOSIS — N179 Acute kidney failure, unspecified: Secondary | ICD-10-CM | POA: Diagnosis not present

## 2024-08-31 LAB — GLUCOSE, CAPILLARY
Glucose-Capillary: 119 mg/dL — ABNORMAL HIGH (ref 70–99)
Glucose-Capillary: 181 mg/dL — ABNORMAL HIGH (ref 70–99)
Glucose-Capillary: 145 mg/dL — ABNORMAL HIGH (ref 70–99)
Glucose-Capillary: 135 mg/dL — ABNORMAL HIGH (ref 70–99)

## 2024-08-31 MED ORDER — HYDROXYZINE HCL 25 MG PO TABS
25.0000 mg | ORAL_TABLET | Freq: Three times a day (TID) | ORAL | Status: DC | PRN
  Administered 2024-09-01 – 2024-09-02 (×2): 25 mg via ORAL
  Filled 2024-08-31 (×2): qty 1

## 2024-08-31 NOTE — Progress Notes (Signed)
 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   Patient was admitted with generalized weakness and severe anemia, hemoglobin of 6.7 g/dL.  Patient has been transfused 2 units of packed red blood cells.  Weakness is thought to be multifactorial.   Workup revealed Pseudomonas UTI and sigmoid diverticulitis with presumed abscess concerning for focal/contained perforation.  General surgery also consulted on admission. Imaging studies also showed severe left hydronephrosis and hydroureter with ureter dilated to the level of the pelvis/area of inflammatory change at the sigmoid colon.  Creatinine also elevated at 2.2 on admission due to concern for bladder outlet obstruction. Lower extremity duplex also showed age-indeterminate DVT involving RLE and acute DVT involving LLE.   Assessment & Plan: AKI on CKD stage IIIb, Left hydronephrosis Baseline creatinine around 1.6 Admitted with serum creatinine of 2.2 Poor oral intake prior to presentation Renal ultrasound shows severe left hydronephrosis; also noted on CT was hydroureter with ureter dilated to the level of the pelvis/area of inflammatory change at the sigmoid Urology was consulted. Initial plan stent placement if creatinine declines or fails to improve Creatinine improved on 12/17, therefore urology holding off on stent placement at this time as showing improvement Renal ultrasound repeated on 08/11/2024.  Moderate to severe but stable left hydronephrosis; renal function continues to improve as well Eventual outpatient  follow-up with urology for repeat imaging again   Acute diverticulitis with abscess CT on 08/04/2024 shows diffuse diverticular disease of the colon with presumed sigmoid diverticulitis and gas fluid collection involving left pelvic sidewall measuring 5.9 cm concerning for focal contained perforation or abscess General Surgery was consulted.  no recommendations for intervention at this time. Fluid collection is near iliac bone and not amenable for drainage 2 weeks antibiotics recommended per surgery transition abx to Augmentin  to complete course; completed 12/24 Diet has been advanced.  So far tolerating well although poor appetite.  No abdominal pain.  Bilateral LE DVT, left leg SVT Significant edema noted in LLE and duplex shows acute DVT involving left common femoral vein, SF junction, femoral vein, proximal profunda vein, popliteal vein, posterior tibial vein, peroneal vein, external iliac vein Age-indeterminate RLE DVT involving right popliteal, posterior tibial, peroneal veins Was on Lovenox . Transition to lower Eliquis . for ease of administration; poor candidate for Lovenox  at home due to cognitive issues  Metastatic non-small cell lung cancer with solitary cerebral brain metastasis Follows with Dr. Sherrod and rad onc. Completed concurrent chemoradiation and 1 year of Durvalumab  consolidation therapy.   Currently on stereotactic radiosurgery for brain mets.  She completed the course of Decadron .   Oncology was consulted.  Physical deconditioning Patient lives with son but in need of 24-hour assistance; ultimately would benefit from LTC especially with memory problems lately  Insurance denied for SNF; denied by [name] as well; Plan is to go home with home hospice.   Chronic hypoxic respiratory failure/COPD Currently not on exacerbation.  On 3 L of oxygen  chronically at home.  Continue bronchodilators, inhalers  Acute on chronic normocytic anemia Baseline hemoglobin around 7 - 8  g/dL Admitted with hemoglobin of 6.8 g/dL.  Likely associated with  malignancy.  No evidence of acute blood loss.   S/p 2 units PRBC since admission  Continue ferrous sulfate .   Hypertension Blood pressure is controlled Takes metoprolol  at home.  Currently on hold   CAD Elevated troponin  On aspirin , Lipitor , Repatha . No anginal symptoms. Takes aspirin  at home, currently on hold, Elevated troponin is likely from demand ischemia from anemia.   History of skin cancer Underwent resection of the skin tumor on the left leg on October 2025 Wound care consulted.  Wound appears to be slightly infected with purulent material.  Treated with antibiotics.   History of recurrent UTI On chronic therapy with nitrofurantoin .   Subjective: No nausea no vomiting no acute complaint.  No fever no chills.  She feels that she is close to her baseline.  Appetite is poor.  Physical Exam: Basal crackles bilaterally. S1-S2 present.  Some bowel sound present.  Nontender. Chronic edema of lower extremities.  Data Reviewed: I have Reviewed nursing notes, Vitals, and Lab results.   Disposition: Status is: Inpatient Remains inpatient appropriate because: Monitor for arrangement of home equipments for home hospice.  SCDs Start: 08/03/24 1141 apixaban  (ELIQUIS ) tablet 5 mg   Family Communication: No one at bedside Level of care: Med-Surg   Vitals:   08/30/24 2053 08/31/24 0428 08/31/24 1356 08/31/24 1720  BP: 133/61 131/62 (!) 156/66 (!) 146/66  Pulse: 69 73 65 76  Resp: 14 15 20 18   Temp: 97.7 F (36.5 C) 98.4 F (36.9 C) 97.8 F (36.6 C) 98.4 F (36.9 C)  TempSrc: Oral Oral Oral Oral  SpO2: 95% 93% 97% 96%  Weight:      Height:         Author: Yetta Blanch, MD 08/31/2024 7:06 PM  Please look on [email or web address] to find out who is on call.

## 2024-08-31 NOTE — TOC Progression Note (Addendum)
 Transition of Care [facility]) - Progression Note    Patient Details  Name: RAGUEL KOSLOSKI MRN: [id number] Date of Birth: 07/24/1936  [facility] CM/SW Contact  [name], RN Phone Number: 08/31/2024, 12:25 PM  Clinical Narrative:   Met with attending on unit, dc plans discussed. After several conversations with patient's daughter and son, they confirmed there are no arrangements made for home care as dtr resides in [place]  and son works long hours, no resources for private duty caregivers.  Attending states plan is to keep patient in house to monitor for improvement in mobility and strength. NCM will continue to follow.   -4:58pm Teams chat from Amy, [facility] hospital liaison- spoke to pts son [name] toda-.  he is aware they don't have many choices on the pts dc plan so he is in agreement with home hospice. I am going to order equipment to be delivered Friday morning per Tony's request. He said that he would be home on Saturday after 1pm so she could be dc home then , team notified.      Expected Discharge Plan: Home w Home Health Services Barriers to Discharge: Continued Medical Work up               Expected Discharge Plan and Services       Living arrangements for the past 2 months: Single Family Home Expected Discharge Date: 08/31/24                                     Social Drivers of Health (SDOH) Interventions SDOH Screenings   Food Insecurity: No Food Insecurity (08/03/2024)  Housing: Low Risk (08/03/2024)  Transportation Needs: No Transportation Needs (08/03/2024)  Utilities: Not At Risk (08/03/2024)  Alcohol Screen: Low Risk (03/09/2024)  Depression (PHQ2-9): Low Risk (07/01/2024)  Financial Resource Strain: Medium Risk (03/09/2024)  Physical Activity: Inactive (03/09/2024)  Social Connections: Moderately Isolated (08/03/2024)  Stress: No Stress Concern Present (03/09/2024)  Recent Concern: Stress - Stress Concern Present (03/09/2024)   Tobacco Use: Medium Risk (08/05/2024)  Health Literacy: Adequate Health Literacy (03/09/2024)    Readmission Risk Interventions    08/23/2024    1:08 PM 06/07/2024    3:57 PM  Readmission Risk Prevention Plan  Transportation Screening Complete Complete  PCP or Specialist Appt within 3-5 Days  Complete  HRI or Home Care Consult  Complete  Social Work Consult for Recovery Care Planning/Counseling  Complete  Palliative Care Screening  Complete  Medication Review [facility]) Complete Referral to Pharmacy  PCP or Specialist appointment within 3-5 days of discharge Complete   HRI or Home Care Consult Complete   SW Recovery Care/Counseling Consult Complete   Palliative Care Screening Not Applicable   Skilled Nursing Facility Complete

## 2024-08-31 NOTE — Plan of Care (Signed)
   Problem: Activity: Goal: Risk for activity intolerance will decrease Outcome: Progressing   Problem: Nutrition: Goal: Adequate nutrition will be maintained Outcome: Progressing   Problem: Pain Managment: Goal: General experience of comfort will improve and/or be controlled Outcome: Progressing   Problem: Safety: Goal: Ability to remain free from injury will improve Outcome: Progressing

## 2024-08-31 NOTE — Progress Notes (Signed)
 [facility] Liaison Note  Received request from Alfonse, Transitions of Care Manager, for hospice services at home after discharge. Spoke with pts son [name] to initiate education related to hospice philosophy, services, and team approach to care. He  verbalized understanding of information given. Per discussion, the plan is for discharge home by [facility] on Saturday, January 10th. DME needs discussed. Patient has a walker in the home. Family requests the following equipment for delivery: hospital bed, overbed table, bedside commode.  The address has been verified and is correct in the chart. [name] is the family contact to arrange time of equipment delivery. Please send signed and completed DNR home with patient. Please provide prescriptions at discharge as needed to ensure ongoing symptom management.  [facility] information and contact numbers given to [place]. Above information shared with Alfonse, Transitions of Care Manager. Please call with any questions or concerns.  Thank you for the opportunity to participate in this patient's care.   Amy Darien BSN, RN [facility] Liaison [phone]

## 2024-08-31 NOTE — Progress Notes (Signed)
 ICM received a message to contact Burnard Homer regarding pt- ICM superviosr left a text message as call would not go through. Kelly sent a message back and said she does not have good services. She would try to call when she could.  ICM Superviosr spoke to [name] (son) he understood pt needed to be d/c home with Home Health or Hospice Care- ICM supervisor explained he options- he said he understood and due to work could have pt come home Friday or Saturday is equipment was delivered. [name] expressed he is sisiter and cousin are involved and not helpful to him.  ICM Supervisor spoke to [facility] and will have [facility] contact [name] today to discuss what their services would look like.  ICM Supervisor called [name] back to let him know that [facility] will be calling him and to answer the call- he stated he understood.  ICM Supervisor gave him the phone number and to call back with any questions.

## 2024-08-31 NOTE — Progress Notes (Signed)
 Physical Therapy Treatment Patient Details Name: LLUVIA GWYNNE MRN: [id number] DOB: 1936-07-30 Today's Date: 08/31/2024   History of Present Illness Pt is an 89y.o female admitted on 08/03/24 with generalized weakness. She fell at home while trying to put her pants on and was unable to get up. PMH includes: metastatic non-small lung cancer to brain, currently on radiation treatment, chronic hypoxic respiratory failure on 3 L O2, CKD stage IIIb, 2 skin cancers on her legs s/p resection, hypertension, coronary artery disease, chronic normocytic anemia, hypothyroidism.    PT Comments  Pt up in recliner, agreeable to therapy, states goal let's walk some. Pt with decreased initiation, reclined back in chair requiring max cues to lean trunk forward, place hands on armrests and engage in powering to stand. Pt needing mod A to power up to standing with RW, once standing pt marches in place x10 reps each leg, noted mild knee buckling bilaterally with min A to steady. Pt returns to sitting after counting to 10, SpO2 94% on 3L. Encouraged additional marching in place or forward mobilization with recliner follow but pt declines, reports pain in abdomen and LLE- notified RN. Continue to recommend 24/7 assist/supv.   If plan is discharge home, recommend the following: A little help with walking and/or transfers;A little help with bathing/dressing/bathroom;Assist for transportation;Assistance with cooking/housework;Help with stairs or ramp for entrance;Direct supervision/assist for medications management;Supervision due to cognitive status   Can travel by private vehicle     No  Equipment Recommendations  None recommended by PT    Recommendations for Other Services       Precautions / Restrictions Precautions Precautions: Fall Recall of Precautions/Restrictions: Impaired Precaution/Restrictions Comments: 3L O2 at baseline Restrictions Weight Bearing Restrictions Per Provider Order: No     Mobility   Bed Mobility               General bed mobility comments: in recliner upon arrival    Transfers Overall transfer level: Needs assistance Equipment used: Rolling walker (2 wheels) Transfers: Sit to/from Stand Sit to Stand: Mod assist           General transfer comment: mod A to power to stand from recliner, multimodal cues for correct hand placement, decreased initiation needing increased cues, slow to rise up    Ambulation/Gait               General Gait Details: pt performs standing marching in place wtih min A to steady, able to complete 10 reps then requests return to sitting, declines further ambulation due to pain and fatigue   Stairs             Wheelchair Mobility     Tilt Bed    Modified Rankin (Stroke Patients Only)       Balance Overall balance assessment: Needs assistance         Standing balance support: Bilateral upper extremity supported, During functional activity, Reliant on assistive device for balance Standing balance-Leahy Scale: Poor                              Communication Communication Communication: No apparent difficulties  Cognition Arousal: Alert Behavior During Therapy: WFL for tasks assessed/performed   PT - Cognitive impairments: Memory, Problem solving, Initiation                       PT - Cognition Comments: Pt demonstrates decreased initiation and requires safety cues  throughout session. Pt needing redirection to task, difficult to follow and tangential at times. Also states I don't want boards on me when I sleep tonight - unable to elaborate, notified RN. Following commands: Impaired Following commands impaired: Follows one step commands inconsistently, Follows one step commands with increased time    Cueing Cueing Techniques: Verbal cues, Tactile cues, Gestural cues, Visual cues  Exercises      General Comments General comments (skin integrity, edema, etc.): Pt on 3L with SpO2  94% post marching in place      Pertinent Vitals/Pain Pain Assessment Pain Assessment: Faces Faces Pain Scale: Hurts whole lot Pain Location: stomach and L leg Pain Descriptors / Indicators: Discomfort, Grimacing Pain Intervention(s): Limited activity within patient's tolerance, Monitored during session, Repositioned, Patient requesting pain meds-RN notified    Home Living                          Prior Function            PT Goals (current goals can now be found in the care plan section) Acute Rehab PT Goals Patient Stated Goal: go home PT Goal Formulation: With patient Time For Goal Achievement: 09/07/24 Potential to Achieve Goals: Fair Progress towards PT goals: Progressing toward goals    Frequency    Min 3X/week      PT Plan      Co-evaluation              AM-PAC PT 6 Clicks Mobility   Outcome Measure  Help needed turning from your back to your side while in a flat bed without using bedrails?: A Little Help needed moving from lying on your back to sitting on the side of a flat bed without using bedrails?: A Little Help needed moving to and from a bed to a chair (including a wheelchair)?: A Lot Help needed standing up from a chair using your arms (e.g., wheelchair or bedside chair)?: A Lot Help needed to walk in hospital room?: A Lot Help needed climbing 3-5 steps with a railing? : Total 6 Click Score: 13    End of Session Equipment Utilized During Treatment: Gait belt;Oxygen  Activity Tolerance: Patient limited by fatigue Patient left: in chair;with call bell/phone within reach;with chair alarm set Nurse Communication: Mobility status;Patient requests pain meds PT Visit Diagnosis: Unsteadiness on feet (R26.81);Muscle weakness (generalized) (M62.81);Difficulty in walking, not elsewhere classified (R26.2)     Time: 8482-8465 PT Time Calculation (min) (ACUTE ONLY): 17 min  Charges:    $Therapeutic Activity: 8-22 mins PT General  Charges $$ ACUTE PT VISIT: 1 Visit                     Tori Kisean Rollo PT, DPT 08/31/2024, 4:06 PM

## 2024-08-31 NOTE — Plan of Care (Signed)
" °  Problem: Health Behavior/Discharge Planning: Goal: Ability to manage health-related needs will improve Outcome: Progressing   Problem: Clinical Measurements: Goal: Ability to maintain clinical measurements within normal limits will improve Outcome: Progressing Goal: Will remain free from infection Outcome: Progressing Goal: Diagnostic test results will improve Outcome: Progressing Goal: Respiratory complications will improve Outcome: Progressing Goal: Cardiovascular complication will be avoided Outcome: Progressing   Problem: Activity: Goal: Risk for activity intolerance will decrease Outcome: Progressing   Problem: Nutrition: Goal: Adequate nutrition will be maintained Outcome: Progressing   Problem: Coping: Goal: Level of anxiety will decrease Outcome: Progressing   Problem: Pain Managment: Goal: General experience of comfort will improve and/or be controlled Outcome: Progressing   Problem: Safety: Goal: Ability to remain free from injury will improve Outcome: Progressing   Problem: Skin Integrity: Goal: Risk for impaired skin integrity will decrease Outcome: Progressing   Problem: Education: Goal: Ability to describe self-care measures that may prevent or decrease complications (Diabetes Survival Skills Education) will improve Outcome: Progressing   Problem: Coping: Goal: Ability to adjust to condition or change in health will improve Outcome: Progressing   Problem: Fluid Volume: Goal: Ability to maintain a balanced intake and output will improve Outcome: Progressing   Problem: Health Behavior/Discharge Planning: Goal: Ability to identify and utilize available resources and services will improve Outcome: Progressing Goal: Ability to manage health-related needs will improve Outcome: Progressing   Problem: Metabolic: Goal: Ability to maintain appropriate glucose levels will improve Outcome: Progressing   Problem: Nutritional: Goal: Maintenance of  adequate nutrition will improve Outcome: Progressing Goal: Progress toward achieving an optimal weight will improve Outcome: Progressing   Problem: Skin Integrity: Goal: Risk for impaired skin integrity will decrease Outcome: Progressing   Problem: Tissue Perfusion: Goal: Adequacy of tissue perfusion will improve Outcome: Progressing   "

## 2024-09-01 DIAGNOSIS — N179 Acute kidney failure, unspecified: Secondary | ICD-10-CM | POA: Diagnosis not present

## 2024-09-01 DIAGNOSIS — N1832 Chronic kidney disease, stage 3b: Secondary | ICD-10-CM | POA: Diagnosis not present

## 2024-09-01 LAB — GLUCOSE, CAPILLARY
Glucose-Capillary: 114 mg/dL — ABNORMAL HIGH (ref 70–99)
Glucose-Capillary: 163 mg/dL — ABNORMAL HIGH (ref 70–99)
Glucose-Capillary: 202 mg/dL — ABNORMAL HIGH (ref 70–99)
Glucose-Capillary: 123 mg/dL — ABNORMAL HIGH (ref 70–99)

## 2024-09-01 NOTE — Plan of Care (Signed)

## 2024-09-01 NOTE — Progress Notes (Signed)
 Occupational Therapy Treatment Patient Details Name: Abigail [name] MRN: [id number] DOB: Oct 08, 1935 Today's Date: 09/01/2024   History of present illness Pt is an 88y.o female admitted on 08/03/24 with generalized weakness. She fell at home while trying to put her pants on and was unable to get up. PMH includes: metastatic non-small lung cancer to brain, currently on radiation treatment, chronic hypoxic respiratory failure on 3 L O2, CKD stage IIIb, 2 skin cancers on her legs s/p resection, hypertension, coronary artery disease, chronic normocytic anemia, hypothyroidism.   OT comments  Patient seen for skilled OT session. Progressed amb to and from [facility] placed away from recliner to add mobility and standing component to session. Limited activity tolerance needing rests between activity. OT recommendations remain appropriate. Patient requires continued Acute care hospital level OT services to progress safety and functional performance and allow for discharge.        If plan is discharge home, recommend the following:  Assistance with cooking/housework;Help with stairs or ramp for entrance;Assist for transportation;A lot of help with bathing/dressing/bathroom;A lot of help with walking and/or transfers   Equipment Recommendations  BSC/3in1;Wheelchair (measurements OT);Wheelchair cushion (measurements OT);Hospital bed;Other (comment)       Precautions / Restrictions Precautions Precautions: Fall Recall of Precautions/Restrictions: Impaired Precaution/Restrictions Comments: 3L O2 at baseline Restrictions Weight Bearing Restrictions Per Provider Order: No Other Position/Activity Restrictions: WBAT       Mobility Bed Mobility               General bed mobility comments: up in recliner and remained    Transfers Overall transfer level: Needs assistance Equipment used: Rolling walker (2 wheels) Transfers: Sit to/from Stand, Bed to chair/wheelchair/BSC Sit to Stand: Min assist, From  elevated surface     Step pivot transfers: Min assist     General transfer comment: cues for hand placement, improved STS but limited time in standing and amb to and from commode placed 10 ft from recliner     Balance Overall balance assessment: Needs assistance Sitting-balance support: No upper extremity supported, Feet supported Sitting balance-Leahy Scale: Good     Standing balance support: Bilateral upper extremity supported, During functional activity, Reliant on assistive device for balance Standing balance-Leahy Scale: Poor Standing balance comment: use of RW unilateral support at min for peri and buttocks hygiene                           ADL either performed or assessed with clinical judgement   ADL Overall ADL's : Needs assistance/impaired Eating/Feeding: Set up;Sitting   Grooming: Wash/dry hands;Wash/dry face;Brushing hair;Set up;Sitting   Upper Body Bathing: Set up;Sitting   Lower Body Bathing: Minimal assistance;Sit to/from stand   Upper Body Dressing : Contact guard assist;Sitting   Lower Body Dressing: Minimal assistance;Cueing for sequencing;Sit to/from stand   Toilet Transfer: Contact guard assist;Ambulation;BSC/3in1;Rolling walker (2 wheels)   Toileting- Clothing Manipulation and Hygiene: Minimal assistance;Sitting/lateral lean Toileting - Clothing Manipulation Details (indicate cue type and reason): post BM     Functional mobility during ADLs: Minimal assistance;Rolling walker (2 wheels) General ADL Comments: decreased functional reach to LE's and overall activity tolerance requiring frequent rest breaks    Extremity/Trunk Assessment Upper Extremity Assessment Upper Extremity Assessment: Generalized weakness;Right hand dominant   Lower Extremity Assessment Lower Extremity Assessment: Defer to PT evaluation                 Communication Communication Communication: No apparent difficulties   Cognition Arousal: Alert  Behavior  During Therapy: WFL for tasks assessed/performed Cognition: Cognition impaired     Awareness: Online awareness impaired Memory impairment (select all impairments): Short-term memory Attention impairment (select first level of impairment): Selective attention Executive functioning impairment (select all impairments): Problem solving OT - Cognition Comments: Oriented to person, place, time and situation. Followed 1 step commands without difficulty mild higher level processing                 Following commands: Impaired Following commands impaired: Follows one step commands inconsistently, Follows one step commands with increased time      Cueing   Cueing Techniques: Verbal cues, Tactile cues, Gestural cues, Visual cues        General Comments no SOB this sesison on 3 ltrs O2, fatigues in stanidng and amb easily, rests breaks for all standing and LB tasks    Pertinent Vitals/ Pain       Pain Assessment Pain Assessment: Faces Faces Pain Scale: Hurts little more Pain Location: abdomen and back Pain Descriptors / Indicators: Discomfort, Grimacing Pain Intervention(s): Monitored during session, Limited activity within patient's tolerance, Premedicated before session, Repositioned, Heat applied   Frequency  Min 2X/week        Progress Toward Goals  OT Goals(current goals can now be found in the care plan section)  Progress towards OT goals: Progressing toward goals      AM-PAC OT 6 Clicks Daily Activity     Outcome Measure   Help from another person eating meals?: None Help from another person taking care of personal grooming?: A Little Help from another person toileting, which includes using toliet, bedpan, or urinal?: A Lot Help from another person bathing (including washing, rinsing, drying)?: A Lot Help from another person to put on and taking off regular upper body clothing?: A Little Help from another person to put on and taking off regular lower body  clothing?: A Lot 6 Click Score: 16    End of Session Equipment Utilized During Treatment: Gait belt;Rolling walker (2 wheels);Oxygen   OT Visit Diagnosis: Unsteadiness on feet (R26.81);Muscle weakness (generalized) (M62.81);Other abnormalities of gait and mobility (R26.89)   Activity Tolerance Patient tolerated treatment well   Patient Left in chair;with call bell/phone within reach;with chair alarm set   Nurse Communication Mobility status;Other (comment) (BM and voiding data added to flow sheets)        Time: 1020-1103 OT Time Calculation (min): 43 min  Charges: OT General Charges $OT Visit: 1 Visit OT Treatments $Self Care/Home Management : 8-22 mins $Therapeutic Activity: 8-22 mins  Tenae Graziosi OT/L Acute Rehabilitation Department  [phone]  09/01/2024, 11:33 AM

## 2024-09-01 NOTE — Plan of Care (Signed)
" °  Problem: Health Behavior/Discharge Planning: Goal: Ability to manage health-related needs will improve Outcome: Progressing   Problem: Clinical Measurements: Goal: Ability to maintain clinical measurements within normal limits will improve Outcome: Progressing Goal: Will remain free from infection Outcome: Progressing Goal: Diagnostic test results will improve Outcome: Progressing Goal: Respiratory complications will improve Outcome: Progressing Goal: Cardiovascular complication will be avoided Outcome: Progressing   Problem: Activity: Goal: Risk for activity intolerance will decrease Outcome: Progressing   Problem: Nutrition: Goal: Adequate nutrition will be maintained Outcome: Progressing   Problem: Coping: Goal: Level of anxiety will decrease Outcome: Progressing   Problem: Pain Managment: Goal: General experience of comfort will improve and/or be controlled Outcome: Progressing   Problem: Safety: Goal: Ability to remain free from injury will improve Outcome: Progressing   Problem: Skin Integrity: Goal: Risk for impaired skin integrity will decrease Outcome: Progressing   Problem: Education: Goal: Ability to describe self-care measures that may prevent or decrease complications (Diabetes Survival Skills Education) will improve Outcome: Progressing   Problem: Coping: Goal: Ability to adjust to condition or change in health will improve Outcome: Progressing   Problem: Fluid Volume: Goal: Ability to maintain a balanced intake and output will improve Outcome: Progressing   Problem: Health Behavior/Discharge Planning: Goal: Ability to identify and utilize available resources and services will improve Outcome: Progressing Goal: Ability to manage health-related needs will improve Outcome: Progressing   Problem: Metabolic: Goal: Ability to maintain appropriate glucose levels will improve Outcome: Progressing   Problem: Nutritional: Goal: Maintenance of  adequate nutrition will improve Outcome: Progressing Goal: Progress toward achieving an optimal weight will improve Outcome: Progressing   Problem: Skin Integrity: Goal: Risk for impaired skin integrity will decrease Outcome: Progressing   Problem: Tissue Perfusion: Goal: Adequacy of tissue perfusion will improve Outcome: Progressing   "

## 2024-09-01 NOTE — Progress Notes (Addendum)
 [facility] Progress Note Patient: Abigail [name] FMW:[id number] DOB: 08-Jan-1936  DOA: 08/03/2024 DOS: the patient was seen and examined on 09/01/2024  Brief Hospital Course: Ms. Stracener is an 89 year old female with PMH metastatic non-small lung cancer with mets to brain, currently on radiation treatment, chronic hypoxic respiratory failure on 3 L O2, CKD stage IIIb, 2 skin cancers on her legs s/p resection, HTN, CAD, COPD, chronic normocytic anemia and hypothyroidism.   Patient was admitted with generalized weakness and severe anemia, hemoglobin of 6.7 g/dL.  Patient has been transfused 2 units of packed red blood cells.  Weakness is thought to be multifactorial.   Workup revealed Pseudomonas UTI and sigmoid diverticulitis with presumed abscess concerning for focal/contained perforation.  General surgery also consulted on admission. Imaging studies also showed severe left hydronephrosis and hydroureter with ureter dilated to the level of the pelvis/area of inflammatory change at the sigmoid colon.  Creatinine also elevated at 2.2 on admission due to concern for bladder outlet obstruction. Lower extremity duplex also showed age-indeterminate DVT involving RLE and acute DVT involving LLE.   Assessment & Plan: AKI on CKD stage IIIb, Left hydronephrosis Baseline creatinine around 1.6 Admitted with serum creatinine of 2.2 Poor oral intake prior to presentation Renal ultrasound shows severe left hydronephrosis; also noted on CT was hydroureter with ureter dilated to the level of the pelvis/area of inflammatory change at the sigmoid Urology was consulted. Initial plan stent placement if creatinine declines or fails to improve Creatinine improved on 12/17, therefore urology holding off on stent placement at this time as showing improvement Renal ultrasound repeated on 08/11/2024.  Moderate to severe but stable left hydronephrosis; renal function continues to improve as well Eventual outpatient  follow-up with urology for repeat imaging again   Acute diverticulitis with abscess CT on 08/04/2024 shows diffuse diverticular disease of the colon with presumed sigmoid diverticulitis and gas fluid collection involving left pelvic sidewall measuring 5.9 cm concerning for focal contained perforation or abscess General Surgery was consulted.  no recommendations for intervention at this time. Fluid collection is near iliac bone and not amenable for drainage 2 weeks antibiotics recommended per surgery transition abx to Augmentin  to complete course; completed 12/24 Diet has been advanced.  So far tolerating well although poor appetite.  No abdominal pain.  Bilateral LE DVT, left leg SVT Significant edema noted in LLE and duplex shows acute DVT involving left common femoral vein, SF junction, femoral vein, proximal profunda vein, popliteal vein, posterior tibial vein, peroneal vein, external iliac vein Age-indeterminate RLE DVT involving right popliteal, posterior tibial, peroneal veins Was on Lovenox . Transition to lower Eliquis . for ease of administration; poor candidate for Lovenox  at home due to cognitive issues  Metastatic non-small cell lung cancer with solitary cerebral brain metastasis Follows with Dr. Sherrod and rad onc. Completed concurrent chemoradiation and 1 year of Durvalumab  consolidation therapy.   Currently on stereotactic radiosurgery for brain mets.  She completed the course of Decadron .   Oncology was consulted.  Physical deconditioning Patient lives with son but in need of 24-hour assistance; ultimately would benefit from LTC especially with memory problems lately  Insurance denied for SNF; denied by [name] as well; Plan is to go home with home hospice.   Chronic hypoxic respiratory failure/COPD Currently not on exacerbation.  On 3 L of oxygen  chronically at home.  Continue bronchodilators, inhalers  Acute on chronic normocytic anemia Baseline hemoglobin around 7 - 8  g/dL Admitted with hemoglobin of 6.8 g/dL.  Likely associated with  malignancy.  No evidence of acute blood loss.   S/p 2 units PRBC since admission  Continue ferrous sulfate .   Hypertension Blood pressure is controlled Takes metoprolol  at home.  Currently on hold   CAD Elevated troponin  On aspirin , Lipitor , Repatha . No anginal symptoms. Takes aspirin  at home, currently on hold, Elevated troponin is likely from demand ischemia from anemia.   History of skin cancer Underwent resection of the skin tumor on the left leg on October 2025 Wound care consulted.  Wound appears to be slightly infected with purulent material.  Treated with antibiotics.   History of recurrent UTI On chronic therapy with nitrofurantoin .  Goals of care conversation. Multiple interaction with the family by prior providers. Currently family engaging with hospice. Plan is to go home with hospice.   Attempted to have a goals of care conversation with the family.  It appears that her primary goal right now is to get home.  Recommend to continue to have goals of care conversation while she is on hospice at home with home hospice agency.  Subjective: Denies any acute complaint.  No nausea no vomiting.  She was talking with her daughter and tells me that her daughter is aware that the plan is to go home with hospice Saturday.  Physical Exam: Basal crackles. S1-S2 present functional bowel sounds Left leg edema present.  Data Reviewed: I have Reviewed nursing notes, Vitals, and Lab results.  Disposition: Status is: Inpatient Remains inpatient appropriate because: Awaiting safe discharge plan on Saturday.  SCDs Start: 08/03/24 1141 apixaban  (ELIQUIS ) tablet 5 mg   Family Communication: No one at bedside Level of care: Med-Surg   Vitals:   08/31/24 2306 09/01/24 0521 09/01/24 0820 09/01/24 1353  BP: (!) 128/56 137/63  (!) 126/50  Pulse: 71 69  69  Resp: 18 18  16   Temp: 98.6 F (37 C) 97.6 F (36.4 C)   98.4 F (36.9 C)  TempSrc: Oral Oral  Oral  SpO2: 96% 99% 99% 98%  Weight:      Height:         Author: Yetta Blanch, MD 09/01/2024 4:34 PM  Please look on [email or web address] to find out who is on call.

## 2024-09-02 DIAGNOSIS — N1832 Chronic kidney disease, stage 3b: Secondary | ICD-10-CM | POA: Diagnosis not present

## 2024-09-02 DIAGNOSIS — N179 Acute kidney failure, unspecified: Secondary | ICD-10-CM | POA: Diagnosis not present

## 2024-09-02 LAB — GLUCOSE, CAPILLARY
Glucose-Capillary: 109 mg/dL — ABNORMAL HIGH (ref 70–99)
Glucose-Capillary: 132 mg/dL — ABNORMAL HIGH (ref 70–99)
Glucose-Capillary: 138 mg/dL — ABNORMAL HIGH (ref 70–99)
Glucose-Capillary: 166 mg/dL — ABNORMAL HIGH (ref 70–99)

## 2024-09-02 NOTE — Progress Notes (Signed)
" °   [facility] Liaison Note  Following up on hospice services at home for patient.  [name], son, confirmed delivery of DME this morning and will be ready for patient to discharge Saturday afternoon.  Please send signed/completed DNR home with patient. Please provide prescriptions as needed to ensure ongoing symptom management.  [facility] information and contact numbers given to [place].  Above information shared with Care Manager.  Please call with any questions or concerns.  Inocente Jacobs [facility] Liaison  [phone] "

## 2024-09-02 NOTE — Plan of Care (Signed)

## 2024-09-02 NOTE — Progress Notes (Signed)
 Physical Therapy Treatment Patient Details Name: Abigail [name] MRN: [id number] DOB: 1936-01-01 Today's Date: 09/02/2024   History of Present Illness Pt is an 89y.o female admitted on 08/03/24 with generalized weakness. She fell at home while trying to put her pants on and was unable to get up. PMH includes: metastatic non-small lung cancer to brain, currently on radiation treatment, chronic hypoxic respiratory failure on 3 L O2, CKD stage IIIb, 2 skin cancers on her legs s/p resection, hypertension, coronary artery disease, chronic normocytic anemia, hypothyroidism.    PT Comments  Pt reclined in recliner, needing assist to reposition to comfort. Session focus on transfers this session, heavy cues for pushing from BUE on armrests of recliner and BSC and activating BLE to push up from, scooting forward to edge of seat and activating knees and hips while powering to stand. Completed step pivot transfers recliner<>BSC, intermittent assist to manage RW, cues for upright posture, therapist managing O2 tubing for safety. Therapist assisting with pericare in standing once pt voided bladder and bowel movement. Pt completes 4 step pivot transfers total and 6 sit to stand transfers total rqeuiring mod to min A for all reps. Pt on 3L with Spo2 94%, denies shortness of breath but does fatigue requiring seated rest breaks to recover. Continue to recommend 24/7 assist at discharge.   If plan is discharge home, recommend the following: A little help with walking and/or transfers;A little help with bathing/dressing/bathroom;Assist for transportation;Assistance with cooking/housework;Help with stairs or ramp for entrance;Direct supervision/assist for medications management;Supervision due to cognitive status   Can travel by private vehicle     No  Equipment Recommendations  None recommended by PT    Recommendations for Other Services       Precautions / Restrictions Precautions Precautions: Fall Recall of  Precautions/Restrictions: Impaired Precaution/Restrictions Comments: 3L O2 at baseline Restrictions Weight Bearing Restrictions Per Provider Order: No Other Position/Activity Restrictions: WBAT     Mobility  Bed Mobility               General bed mobility comments: up in recliner and remained    Transfers Overall transfer level: Needs assistance Equipment used: Rolling walker (2 wheels) Transfers: Sit to/from Stand, Bed to chair/wheelchair/BSC Sit to Stand: Mod assist, Min assist   Step pivot transfers: Min assist       General transfer comment: mod-min A to power up from recliner and BSC, cues to push with arms and legs and use momentum to power to stand; focus on transfers with pt completing multiple reps, intermittent cues for body positioning within RW frame and assist to manage, pt taking 3-4 steps between recliner and [facility]    Ambulation/Gait                   Stairs             Wheelchair Mobility     Tilt Bed    Modified Rankin (Stroke Patients Only)       Balance Overall balance assessment: Needs assistance Sitting-balance support: No upper extremity supported, Feet supported Sitting balance-Leahy Scale: Good     Standing balance support: Bilateral upper extremity supported, During functional activity, Reliant on assistive device for balance Standing balance-Leahy Scale: Poor Standing balance comment: use of RW unilateral support at min for peri and buttocks hygiene                            Communication Communication Communication: No apparent  difficulties  Cognition Arousal: Alert Behavior During Therapy: WFL for tasks assessed/performed                           PT - Cognition Comments: pt able to state name and location today, reports d/c home tomorrow with son. Pt focused on task this session, following commands, appropriate, slightly delayed but able to initiate Following commands: Intact Following  commands impaired: Only follows one step commands consistently    Cueing Cueing Techniques: Verbal cues, Gestural cues, Tactile cues  Exercises      General Comments General comments (skin integrity, edema, etc.): pt denies SOB, on 3L with Spo2 94%, fatigues easily requiring seated rest breaks to recover      Pertinent Vitals/Pain Pain Assessment Pain Assessment: Faces Faces Pain Scale: Hurts a little bit Pain Location: abdomen and back Pain Descriptors / Indicators: Discomfort, Grimacing Pain Intervention(s): Limited activity within patient's tolerance, Monitored during session, Repositioned    Home Living                          Prior Function            PT Goals (current goals can now be found in the care plan section) Acute Rehab PT Goals Patient Stated Goal: go home PT Goal Formulation: With patient Time For Goal Achievement: 09/07/24 Potential to Achieve Goals: Fair Progress towards PT goals: Progressing toward goals    Frequency    Min 2X/week      PT Plan      Co-evaluation              AM-PAC PT 6 Clicks Mobility   Outcome Measure  Help needed turning from your back to your side while in a flat bed without using bedrails?: A Little Help needed moving from lying on your back to sitting on the side of a flat bed without using bedrails?: A Little Help needed moving to and from a bed to a chair (including a wheelchair)?: A Lot Help needed standing up from a chair using your arms (e.g., wheelchair or bedside chair)?: A Lot Help needed to walk in hospital room?: A Lot Help needed climbing 3-5 steps with a railing? : Total 6 Click Score: 13    End of Session Equipment Utilized During Treatment: Gait belt;Oxygen  Activity Tolerance: Patient tolerated treatment well Patient left: in chair;with call bell/phone within reach;with chair alarm set Nurse Communication: Mobility status PT Visit Diagnosis: Unsteadiness on feet (R26.81);Muscle  weakness (generalized) (M62.81);Difficulty in walking, not elsewhere classified (R26.2)     Time: 8753-8678 PT Time Calculation (min) (ACUTE ONLY): 35 min  Charges:    $Therapeutic Activity: 23-37 mins PT General Charges $$ ACUTE PT VISIT: 1 Visit                     Tori Saathvik Every PT, DPT 09/02/2024, 1:30 PM

## 2024-09-02 NOTE — Progress Notes (Signed)
 [facility] Progress Note Patient: Abigail [name] FMW:[id number] DOB: 08-Dec-1935  DOA: 08/03/2024 DOS: the patient was seen and examined on 09/02/2024  Brief Hospital Course: Abigail [name] is an 89 year old female with PMH metastatic non-small lung cancer with mets to brain, currently on radiation treatment, chronic hypoxic respiratory failure on 3 L O2, CKD stage IIIb, 2 skin cancers on her legs s/p resection, HTN, CAD, COPD, chronic normocytic anemia and hypothyroidism.   Patient was admitted with generalized weakness and severe anemia, hemoglobin of 6.7 g/dL.  Patient has been transfused 2 units of packed red blood cells.  Weakness is thought to be multifactorial.   Workup revealed Pseudomonas UTI and sigmoid diverticulitis with presumed abscess concerning for focal/contained perforation.  General surgery also consulted on admission. Imaging studies also showed severe left hydronephrosis and hydroureter with ureter dilated to the level of the pelvis/area of inflammatory change at the sigmoid colon.  Creatinine also elevated at 2.2 on admission due to concern for bladder outlet obstruction. Lower extremity duplex also showed age-indeterminate DVT involving RLE and acute DVT involving LLE.   Assessment & Plan: AKI on CKD stage IIIb, Left hydronephrosis Baseline creatinine around 1.6 Admitted with serum creatinine of 2.2 Poor oral intake prior to presentation Renal ultrasound shows severe left hydronephrosis; also noted on CT was hydroureter with ureter dilated to the level of the pelvis/area of inflammatory change at the sigmoid Urology was consulted. Initial plan stent placement if creatinine declines or fails to improve Creatinine improved on 12/17, therefore urology holding off on stent placement at this time as showing improvement Renal ultrasound repeated on 08/11/2024.  Moderate to severe but stable left hydronephrosis; renal function continues to improve as well Eventual outpatient  follow-up with urology for repeat imaging again   Acute diverticulitis with abscess CT on 08/04/2024 shows diffuse diverticular disease of the colon with presumed sigmoid diverticulitis and gas fluid collection involving left pelvic sidewall measuring 5.9 cm concerning for focal contained perforation or abscess General Surgery was consulted.  no recommendations for intervention at this time. Fluid collection is near iliac bone and not amenable for drainage 2 weeks antibiotics recommended per surgery transition abx to Augmentin  to complete course; completed 12/24 Diet has been advanced.  So far tolerating well although poor appetite.  No abdominal pain.  Bilateral LE DVT, left leg SVT Significant edema noted in LLE and duplex shows acute DVT involving left common femoral vein, SF junction, femoral vein, proximal profunda vein, popliteal vein, posterior tibial vein, peroneal vein, external iliac vein Age-indeterminate RLE DVT involving right popliteal, posterior tibial, peroneal veins Was on Lovenox . Transition to lower Eliquis . for ease of administration; poor candidate for Lovenox  at home due to cognitive issues  Metastatic non-small cell lung cancer with solitary cerebral brain metastasis Follows with Dr. Sherrod and rad onc. Completed concurrent chemoradiation and 1 year of Durvalumab  consolidation therapy.   Currently on stereotactic radiosurgery for brain mets.  She completed the course of Decadron .   Oncology was consulted.  Physical deconditioning Patient lives with son but in need of 24-hour assistance; ultimately would benefit from LTC especially with memory problems lately  Insurance denied for SNF; denied by [name] as well; Plan is to go home with home hospice.   Chronic hypoxic respiratory failure/COPD Currently not on exacerbation.  On 3 L of oxygen  chronically at home.  Continue bronchodilators, inhalers  Acute on chronic normocytic anemia Baseline hemoglobin around 7 - 8  g/dL Admitted with hemoglobin of 6.8 g/dL.  Likely associated with  malignancy.  No evidence of acute blood loss.   S/p 2 units PRBC since admission  Continue ferrous sulfate .   Hypertension Blood pressure is controlled Takes metoprolol  at home.  Currently on hold   CAD Elevated troponin  On aspirin , Lipitor , Repatha . No anginal symptoms. Takes aspirin  at home, currently on hold, Elevated troponin is likely from demand ischemia from anemia.   History of skin cancer Underwent resection of the skin tumor on the left leg on October 2025 Wound care consulted.  Wound appears to be slightly infected with purulent material.  Treated with antibiotics.   History of recurrent UTI On chronic therapy with nitrofurantoin .  Goals of care conversation. Multiple interaction with the family by prior providers. Currently family engaging with hospice. At the time of my evaluation patient's goal remains to go home. Plan is to go home with hospice.   Subjective: Denies any acute complaint.  Appears to be having a flat affect today with no eye contact.  No nausea or vomiting.  Minimal oral intake.  Had a BM.  Physical Exam: Basilar crackles. S1-S2 present Bowel sound present Lower EXTR edema.  Left leg more than right.  Data Reviewed: I have Reviewed nursing notes, Vitals, and Lab results.  Disposition: Status is: Inpatient Remains inpatient appropriate because: Medically stable.  Awaiting arrangement for home with hospice.  Ready for discharge tomorrow.  SCDs Start: 08/03/24 1141 apixaban  (ELIQUIS ) tablet 5 mg   Family Communication: No one at bedside Level of care: Med-Surg   Vitals:   09/01/24 2119 09/02/24 0113 09/02/24 0506 09/02/24 1410  BP: (!) 141/61 (!) 141/59 (!) 144/62 (!) 135/57  Pulse: 69 68 71 73  Resp: 17 14 14 16   Temp: 98.4 F (36.9 C) 98.1 F (36.7 C) 98.5 F (36.9 C) 98.4 F (36.9 C)  TempSrc: Oral Oral Oral Oral  SpO2: 98% 98% 97%   Weight:      Height:          Author: Yetta Blanch, MD 09/02/2024 5:49 PM  Please look on [email or web address] to find out who is on call.

## 2024-09-02 NOTE — Plan of Care (Signed)
" °  Problem: Health Behavior/Discharge Planning: Goal: Ability to manage health-related needs will improve Outcome: Progressing   Problem: Clinical Measurements: Goal: Ability to maintain clinical measurements within normal limits will improve Outcome: Progressing Goal: Will remain free from infection Outcome: Progressing Goal: Diagnostic test results will improve Outcome: Progressing Goal: Respiratory complications will improve Outcome: Progressing Goal: Cardiovascular complication will be avoided Outcome: Progressing   Problem: Activity: Goal: Risk for activity intolerance will decrease Outcome: Progressing   Problem: Nutrition: Goal: Adequate nutrition will be maintained Outcome: Progressing   Problem: Coping: Goal: Level of anxiety will decrease Outcome: Progressing   Problem: Pain Managment: Goal: General experience of comfort will improve and/or be controlled Outcome: Progressing   Problem: Safety: Goal: Ability to remain free from injury will improve Outcome: Progressing   Problem: Skin Integrity: Goal: Risk for impaired skin integrity will decrease Outcome: Progressing   Problem: Education: Goal: Ability to describe self-care measures that may prevent or decrease complications (Diabetes Survival Skills Education) will improve Outcome: Progressing   Problem: Coping: Goal: Ability to adjust to condition or change in health will improve Outcome: Progressing   Problem: Fluid Volume: Goal: Ability to maintain a balanced intake and output will improve Outcome: Progressing   Problem: Health Behavior/Discharge Planning: Goal: Ability to identify and utilize available resources and services will improve Outcome: Progressing Goal: Ability to manage health-related needs will improve Outcome: Progressing   Problem: Metabolic: Goal: Ability to maintain appropriate glucose levels will improve Outcome: Progressing   Problem: Nutritional: Goal: Maintenance of  adequate nutrition will improve Outcome: Progressing Goal: Progress toward achieving an optimal weight will improve Outcome: Progressing   Problem: Skin Integrity: Goal: Risk for impaired skin integrity will decrease Outcome: Progressing   Problem: Tissue Perfusion: Goal: Adequacy of tissue perfusion will improve Outcome: Progressing   "

## 2024-09-03 ENCOUNTER — Other Ambulatory Visit (HOSPITAL_COMMUNITY): Payer: Self-pay

## 2024-09-03 DIAGNOSIS — N179 Acute kidney failure, unspecified: Secondary | ICD-10-CM | POA: Diagnosis not present

## 2024-09-03 DIAGNOSIS — N1832 Chronic kidney disease, stage 3b: Secondary | ICD-10-CM | POA: Diagnosis not present

## 2024-09-03 LAB — GLUCOSE, CAPILLARY: Glucose-Capillary: 126 mg/dL — ABNORMAL HIGH (ref 70–99)

## 2024-09-03 MED ORDER — OXYCODONE HCL 5 MG PO TABS
5.0000 mg | ORAL_TABLET | ORAL | 0 refills | Status: AC | PRN
  Filled 2024-09-03: qty 30, 5d supply, fill #0

## 2024-09-03 MED ORDER — HYDROXYZINE HCL 25 MG PO TABS
25.0000 mg | ORAL_TABLET | Freq: Three times a day (TID) | ORAL | 0 refills | Status: AC | PRN
  Filled 2024-09-03: qty 30, 10d supply, fill #0

## 2024-09-03 NOTE — TOC Transition Note (Signed)
 Transition of Care [facility]) - Discharge Note   Patient Details  Name: Abigail [name] MRN: [id number] Date of Birth: 05-13-1936  Transition of Care (TOC) CM/SW Contact:  Lorraine LILLETTE Fenton, LCSW Phone Number: 09/03/2024, 11:10 AM   Clinical Narrative:     Pt expected to DC today home with [facility] home hospice.  CSW added [facility] to chat- verifying all needed equipment is in place.  CSW called son who stated that all needed equipment is in place and he is ready for pt return home.   Medical necessity completed and pt added to PTAR list for 3PM.  No further ICM needs at this time.     Barriers to Discharge: No Barriers Identified   Patient Goals and CMS Choice Patient states their goals for this hospitalization and ongoing recovery are:: retrun home with home health CMS Medicare.gov Compare Post Acute Care list provided to:: Patient Choice offered to / list presented to : Patient      Discharge Placement                       Discharge Plan and Services Additional resources added to the After Visit Summary for                                       Social Drivers of Health (SDOH) Interventions SDOH Screenings   Food Insecurity: No Food Insecurity (08/03/2024)  Housing: Low Risk (08/03/2024)  Transportation Needs: No Transportation Needs (08/03/2024)  Utilities: Not At Risk (08/03/2024)  Alcohol Screen: Low Risk (03/09/2024)  Depression (PHQ2-9): Low Risk (07/01/2024)  Financial Resource Strain: Medium Risk (03/09/2024)  Physical Activity: Inactive (03/09/2024)  Social Connections: Moderately Isolated (08/03/2024)  Stress: No Stress Concern Present (03/09/2024)  Recent Concern: Stress - Stress Concern Present (03/09/2024)  Tobacco Use: Medium Risk (08/05/2024)  Health Literacy: Adequate Health Literacy (03/09/2024)     Readmission Risk Interventions    08/23/2024    1:08 PM 06/07/2024    3:57 PM  Readmission Risk Prevention Plan  Transportation  Screening Complete Complete  PCP or Specialist Appt within 3-5 Days  Complete  HRI or Home Care Consult  Complete  Social Work Consult for Recovery Care Planning/Counseling  Complete  Palliative Care Screening  Complete  Medication Review [employer]ographer) Complete Referral to Pharmacy  PCP or Specialist appointment within 3-5 days of discharge Complete   HRI or Home Care Consult Complete   SW Recovery Care/Counseling Consult Complete   Palliative Care Screening Not Applicable   Skilled Nursing Facility Complete

## 2024-09-03 NOTE — Progress Notes (Signed)
 PTAR has arrived. RN called pts son [name] to advise him that she will be arriving home shortly. RN explained to [name] and pt that medications had been delivered to pts room and placed in pts striped bag. RN explained to pts son that Discharge medication and information is in pts discharge packet. RN answered pts sons questions.

## 2024-09-03 NOTE — Progress Notes (Signed)
 Discharge meds in a secure bag delivered to patient by this RN

## 2024-09-03 NOTE — Discharge Summary (Signed)
 " Physician Discharge Summary   Patient: Abigail [name] MRN: [id number] DOB: [birth year]  Admit date:     08/03/2024  Discharge date: 09/03/2024  Discharge Physician: Abigail [name]  PCP: Abigail Speaks, FNP  Disposition: home with hospice Recommendations at discharge: Establish care with hospice  Follow up with PCP as needed   Contact information for follow-up providers     [facility] Follow up.   Specialty: Hospice and Palliative Medicine Why: To Establish Care Contact information: [address] [place]  [address] [phone]             Contact information for after-discharge care     Destination     [facility], [facility] Preferred SNF .   Service: Skilled Nursing Contact information: [address] [place]  [phone]             Home Medical Care     [facility]) .   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   Patient was admitted with generalized weakness and severe anemia, hemoglobin of 6.7 g/dL.  Patient has been transfused 2 units of packed red blood cells.  Weakness is thought to be multifactorial.   Workup revealed Pseudomonas UTI and sigmoid diverticulitis with presumed abscess concerning for focal/contained perforation.  General surgery also consulted on admission. Imaging studies also showed severe left hydronephrosis and hydroureter with ureter dilated to the level of the pelvis/area of inflammatory change at the sigmoid colon.  Creatinine  also elevated at 2.2 on admission due to concern for bladder outlet obstruction. Lower extremity duplex also showed age-indeterminate DVT involving RLE and acute DVT involving LLE.   Assessment & Plan: AKI on CKD stage IIIb, Left hydronephrosis Baseline creatinine around 1.6 Admitted with serum creatinine of 2.2 Poor oral intake prior to presentation Renal ultrasound shows severe left hydronephrosis; also noted on CT was hydroureter with ureter dilated to the level of the pelvis/area of inflammatory change at the sigmoid Urology was consulted. Initial plan stent placement if creatinine declines or fails to improve Creatinine improved on 12/17, therefore urology holding off on stent placement at this time as showing improvement Renal ultrasound repeated on 08/11/2024.  Moderate to severe but stable left hydronephrosis; renal function continues to improve as well Eventual outpatient follow-up with urology for repeat imaging again   Acute diverticulitis with abscess CT on 08/04/2024 shows diffuse diverticular disease of the colon with presumed sigmoid diverticulitis and gas fluid collection involving left pelvic sidewall measuring 5.9 cm concerning for focal contained perforation or abscess General Surgery was consulted.  no recommendations for intervention at this time. Fluid collection is near iliac bone and not amenable for drainage 2 weeks antibiotics recommended per surgery transition abx to Augmentin  to complete course; completed 12/24 Diet has been advanced.  So far tolerating well although poor appetite.  No abdominal pain.  Bilateral LE DVT, left leg SVT Significant edema noted in LLE and duplex shows acute DVT involving left common femoral vein, SF junction, femoral vein, proximal profunda vein,  popliteal vein, posterior tibial vein, peroneal vein, external iliac vein Age-indeterminate RLE DVT involving right popliteal, posterior tibial, peroneal veins Was on Lovenox . Transition to  lower Eliquis . for ease of administration; poor candidate for Lovenox  at home due to cognitive issues  Metastatic non-small cell lung cancer with solitary cerebral brain metastasis Follows with Abigail [name] and rad onc. Completed concurrent chemoradiation and 1 year of Durvalumab  consolidation therapy.   Currently on stereotactic radiosurgery for brain mets.  She completed the course of Decadron .   Oncology was consulted.  Physical deconditioning Patient lives with son but in need of 24-hour assistance; ultimately would benefit from LTC especially with memory problems lately  Insurance denied for SNF; denied by [name] as well; Plan is to go home with home hospice.   Chronic hypoxic respiratory failure/COPD Currently not on exacerbation.  On 3 L of oxygen  chronically at home.  Continue bronchodilators, inhalers  Acute on chronic normocytic anemia Baseline hemoglobin around 7 - 8 g/dL Admitted with hemoglobin of 6.8 g/dL.  Likely associated with malignancy.  No evidence of acute blood loss.   S/p 2 units PRBC since admission  Continue ferrous sulfate .   Hypertension Blood pressure is controlled Takes metoprolol  at home.  Currently on hold   CAD Elevated troponin  On aspirin , Lipitor , Repatha . No anginal symptoms. Takes aspirin  at home, currently on hold, Elevated troponin is likely from demand ischemia from anemia.   History of skin cancer Underwent resection of the skin tumor on the left leg on October 2025 Wound care consulted.  Wound appears to be slightly infected with purulent material.  Treated with antibiotics.   History of recurrent UTI On chronic therapy with nitrofurantoin .  Goals of care conversation. Multiple interaction with the family by prior providers. Currently family engaging with hospice. At the time of my evaluation patient's goal remains to go home. Plan is to go home with hospice.    Pain control - [place]  Controlled Substance Reporting System  database was reviewed. and patient was instructed, not to drive, operate heavy machinery, perform activities at heights, swimming or participation in water activities or provide baby-sitting services while on Pain, Sleep and Anxiety Medications; until their outpatient Physician has advised to do so again. Also recommended to not to take more than prescribed Pain, Sleep and Anxiety Medications.   Consultants:  Oncology  Urology  General surgery  Palliative care  PCCM   Procedures performed:  left thoracentesis.  12/30   DISCHARGE MEDICATION: Allergies as of 09/03/2024       Reactions   Codeine Nausea And Vomiting   Norvasc  [amlodipine ] Rash        Medication List     STOP taking these medications    aspirin  EC 81 MG tablet   atorvastatin  80 MG tablet Commonly known as: LIPITOR    dexamethasone  2 MG tablet Commonly known as: DECADRON    estradiol  0.1 MG/GM vaginal cream Commonly known as: ESTRACE    feeding supplement Liqd   fluconazole  100 MG tablet Commonly known as: DIFLUCAN    HYDROcodone -acetaminophen  5-325 MG tablet Commonly known as: NORCO/VICODIN   nitrofurantoin  (macrocrystal-monohydrate) 100 MG capsule Commonly known as: MACROBID    Repatha  SureClick 140 MG/ML Soaj Generic drug: Evolocumab    silver  sulfADIAZINE  1 % cream Commonly known as: SILVADENE    trospium  20 MG tablet Commonly known as: SANCTURA        TAKE these medications    acetaminophen  500 MG tablet Commonly known as: TYLENOL  Take 1,000 mg by mouth every 6 (six) hours as  needed (for pain).   albuterol  108 (90 Base) MCG/ACT inhaler Commonly known as: VENTOLIN  HFA TAKE 2 PUFFS BY MOUTH EVERY 6 HOURS AS NEEDED FOR WHEEZE OR SHORTNESS OF BREATH What changed: See the new instructions.   Breztri  Aerosphere 160-9-4.8 MCG/ACT Aero inhaler Generic drug: budesonide -glycopyrrolate -formoterol  INHALE 2 PUFFS INTO THE LUNGS IN THE MORNING AND ALSO 2 PUFFS AT BEDTIME   Eliquis  5 MG Tabs  tablet Generic drug: apixaban  Take 1 tablet (5 mg total) by mouth 2 (two) times daily.   ferrous sulfate  325 (65 FE) MG EC tablet TAKE 1 TABLET BY MOUTH EVERY DAY WITH BREAKFAST What changed: See the new instructions.   hydrOXYzine  25 MG tablet Commonly known as: ATARAX  Take 1 tablet (25 mg total) by mouth 3 (three) times daily as needed for anxiety.   levothyroxine  175 MCG tablet Commonly known as: SYNTHROID  TAKE 1 TABLET BY MOUTH EVERY MORNING ON MONDAY - FRIDAY What changed: See the new instructions.   metoprolol  succinate 50 MG 24 hr tablet Commonly known as: TOPROL -XL Take 1 tablet (50 mg total) by mouth every evening. Take with or immediately following a meal. What changed: additional instructions   nitrofurantoin  100 MG capsule Commonly known as: MACRODANTIN  Take 100 mg by mouth daily.   nitroGLYCERIN  0.4 MG SL tablet Commonly known as: NITROSTAT  PLACE 1 TABLET UNDER THE TONGUE EVERY 5 (FIVE) MINUTES X 3 DOSES AS NEEDED FOR CHEST PAIN.   oxyCODONE  5 MG immediate release tablet Commonly known as: Oxy IR/ROXICODONE  Take 1 tablet (5 mg total) by mouth every 4 (four) hours as needed for moderate pain (pain score 4-6) or severe pain (pain score 7-10).   OXYGEN  Inhale 3 L/min into the lungs continuous.   polyethylene glycol powder 17 GM/SCOOP powder Commonly known as: GLYCOLAX /MIRALAX  Dissolve 1 capful (17g) in 4-8 ounces of liquid and take by mouth daily as needed for mild constipation.               Durable Medical Equipment  (From admission, onward)           Start     Ordered   08/30/24 1706  For home use only DME Bedside commode  Once       Question:  Patient needs a bedside commode to treat with the following condition  Answer:  Lung cancer (HCC)   08/30/24 1705            Diet recommendation: Regular diet  Discharge Exam: Vitals:   09/02/24 1410 09/02/24 1948 09/02/24 2120 09/03/24 0617  BP: (!) 135/57  (!) 139/57 (!) 126/52  Pulse: 73   73 74  Resp: 16  17 18   Temp: 98.4 F (36.9 C)  99 F (37.2 C) 98.2 F (36.8 C)  TempSrc: Oral  Oral Oral  SpO2:  99% 98% 97%  Weight:      Height:        Filed Weights   08/03/24 0908 08/03/24 1359  Weight: 70.8 kg 71.5 kg   General: in Mild distress, No Rash Cardiovascular: S1 and S2 Present, No Murmur Respiratory: Good respiratory effort, Bilateral Air entry present. basal Crackles, No wheezes Abdomen: Bowel Sound present, No tenderness Extremities: left leg edema Neuro: Alert and oriented x3, no new focal deficit flat affect.   Condition at discharge: stable  The results of significant diagnostics from this hospitalization (including imaging, microbiology, ancillary and laboratory) are listed below for reference.   Imaging Studies: DG Chest Port 1 View Result Date: 08/23/2024 CLINICAL DATA:  Status post thoracentesis. EXAM: PORTABLE CHEST 1 VIEW COMPARISON:  Chest radiograph dated 08/21/2024. FINDINGS: Decrease in the size of the left pleural effusion. No detectable pneumothorax. The right lung is clear. Stable cardiomegaly. No acute osseous pathology. IMPRESSION: Decrease in the size of the left pleural effusion. No detectable pneumothorax. Electronically Signed   By: Vanetta Chou M.D.   On: 08/23/2024 16:56   IR THORACENTESIS ASP PLEURAL SPACE W/IMG GUIDE Result Date: 08/23/2024 INDICATION: History of metastatic non-small-cell lung cancer. With left pleural effusion. Request for diagnostic and therapeutic left thoracentesis. EXAM: ULTRASOUND GUIDED DIAGNOSTIC AND THERAPEUTIC THORACENTESIS MEDICATIONS: 10 mL 1% lidocaine  COMPLICATIONS: None immediate. PROCEDURE: An ultrasound guided thoracentesis was thoroughly discussed with the patient and questions answered. The benefits, risks, alternatives and complications were also discussed. The patient understands and wishes to proceed with the procedure. Written consent was obtained. Ultrasound was performed to localize and mark an  adequate pocket of fluid in the left chest. The area was then prepped and draped in the normal sterile fashion. 1% Lidocaine  was used for local anesthesia. Under ultrasound guidance a 6 Fr Safe-T-Centesis catheter was introduced. Thoracentesis was performed. The catheter was removed and a dressing applied. FINDINGS: A total of approximately 700 mL of hazy orange fluid was removed. Samples were sent to the laboratory as requested by the clinical team. IMPRESSION: Successful ultrasound guided left thoracentesis yielding 700 mL of pleural fluid. Performed and dictated by Kimble Clas, PA-C Electronically Signed   By: Ester Sides M.D.   On: 08/23/2024 15:20   CT CHEST WO CONTRAST Result Date: 08/22/2024 EXAM: CT CHEST WITHOUT CONTRAST 08/21/2024 12:17:42 PM TECHNIQUE: CT of the chest was performed without the administration of intravenous contrast. Multiplanar reformatted images are provided for review. Automated exposure control, iterative reconstruction, and/or weight based adjustment of the mA/kV was utilized to reduce the radiation dose to as low as reasonably achievable. COMPARISON: Portable chest from today, portable chest from 06/06/2024, chest CTs without contrast 06/07/2024 and 04/04/2024, and CTA chest 10/11/2023. CLINICAL HISTORY: pleural effusion Prior left-sided lung cancer, post-treatment. FINDINGS: MEDIASTINUM: Mild cardiomegaly. Small chronic pericardial effusion. The coronary arteries are heavily calcified. The aorta is heavily calcified. There is mild aortic tortuosity without aneurysm. Moderate calcific plaques in the great vessels. There is a prominent pulmonary trunk measuring 3.4 cm indicating pulmonary arterial hypertension. Pulmonary veins are nondistended. . The central airways are clear. The trachea is patent. There is a moderate to large-sized hiatal hernia. LYMPH NODES: No intrathoracic adenopathy is seen Without contrast; assessment for hilar adenopathy is limited on the left greater  than right. LUNGS AND PLEURA: There is a moderate-sized left pleural effusion, interval increased. Some of the fluid extends subpulmonic and could be partially loculated. . Most of it layers posteriorly. The fluid is low density, 9 to 14 HU, and appears uncomplicated. There is a minimal right pleural effusion, new since the prior study, with adjacent atelectasis in the lower lobe. There is compressive collapse or consolidation of the left lower lobe basal segments and basal lingula. Post-treatment changes on the left similar in appearance to the prior studies although it would be better demonstrated with IV contrast. There is left upper lobe paramediastinal fibrosis with consolidation and air bronchograms with contiguous extension into the medial aspect of the superior segment of the left lower lobe, with architectural distortion and volume loss with linear scarring in the adjacent left upper lobe. Mild posterior atelectasis in the lungs. Lungs are mildly emphysematous with centrilobular change predominating. The remaining right  lung is clear. No pulmonary edema. No pneumothorax. SOFT TISSUES/BONES: Osteopenia, kypholevoscoliosis and advanced degenerative change of the thoracic spine with multilevel interbody ankylosis. No bone metastasis is seen. There is a chronic healed fracture of the proximal left humerus. There is moderate stranding and patchy subcutaneous fluid in the lower lateral left chest wall, question whether due to third spacing, local trauma, or cellulitis. UPPER ABDOMEN: There is increased left hydronephrosis new from the last CT and only partially assessed ; further evaluation recommended. Small left renal cysts including hyperdense cysts are again shown. Limited images of the upper abdomen demonstrates no other acute abnormality. IMPRESSION: 1. Interval increased moderate-sized left pleural effusion with possible partial loculation inferiorly . 2. Compressive collapse or consolidation of the left  lower lobe basal segments and basal lingula. 3. New minimal right pleural effusion with adjacent atelectasis in the lower lobe. 4. Moderate stranding and patchy subcutaneous fluid in the lower lateral left chest wall. Etiology indeterminate . 5. Post-treatment changes in the left upper lobe with paramediastinal fibrosis, consolidation and air bronchograms, with contiguous extension into the medial aspect of the superior segment of the left lower lobe. 6. Increased left hydronephrosis, new from the last CT, further evaluation recommended. 7. Prominent pulmonary trunk measuring 3.4 cm, consistent with pulmonary arterial hypertension. 8. No bone metastasis is seen. Electronically signed by: Francis Quam MD 08/22/2024 01:16 AM EST RP Workstation: [id number]   DG CHEST PORT 1 VIEW Result Date: 08/21/2024 CLINICAL DATA:  Fever.  Fatigue.  History of lung cancer. EXAM: PORTABLE CHEST 1 VIEW COMPARISON:  Radiograph 06/06/2024, CT 06/07/2024 FINDINGS: Left lung volume loss with ill-defined perihilar opacity. Progressive retrocardiac opacity which may be airspace disease or pleural effusion. Bronchial thickening throughout the right lung has progressed from prior exam. No pneumothorax. IMPRESSION: 1. Left lung volume loss with ill-defined perihilar opacity, likely related to known lung cancer. 2. Progressive retrocardiac opacity which may be airspace disease or pleural effusion. 3. Progressive bronchial thickening throughout the right lung. Electronically Signed   By: Andrea Gasman M.D.   On: 08/21/2024 15:50   US  RENAL Result Date: 08/11/2024 EXAM: US  Retroperitoneum Complete, Renal. 08/11/2024 07:23:00 PM TECHNIQUE: Real-time ultrasonography of the retroperitoneum renal was performed. COMPARISON: Ultrasound 08/06/2024 CLINICAL HISTORY: 6300 Hydronephrosis 6300 FINDINGS: FINDINGS: RIGHT KIDNEY/URETER: Right kidney measures 10.0 x 4.4 x 4.4 cm. The calculated volume is 101 ml. A 1.8 cm simple appearing cyst is  noted within the mid portion of the right kidney. Increased echogenicity is noted consistent with medical renal disease. No hydronephrosis. LEFT KIDNEY/URETER: Left kidney measures 10.1 x 4.6 x 6.1 cm. Calculated volume is 147 ml. Hydronephrosis is noted of a moderate to severe degree similar to that seen on the prior examination. Increased echogenicity is noted, consistent with medical renal disease. BLADDER: Bladder is decompressed. IMPRESSION: 1. Moderate to severe left hydronephrosis, similar to prior exam. 2. Increased echogenicity in both kidneys, consistent with medical renal disease. Electronically signed by: Oneil Devonshire MD 08/11/2024 10:37 PM EST RP Workstation: [id number]   VAS US  LOWER EXTREMITY VENOUS (DVT) Result Date: 08/08/2024  Lower Venous DVT Study Patient Name:  MYLES MALLICOAT  Date of Exam:   08/08/2024 Medical Rec #: [id number]     Accession #:    [id number] Date of Birth: 1936-03-11     Patient Gender: F Patient Age:   68 years Exam Location:  [facility] Procedure:      VAS US  LOWER EXTREMITY VENOUS (DVT) Referring Phys: LEATRICE OGBATA --------------------------------------------------------------------------------  Indications:  Edema. Other Indications: Severe left-sided hydroureteronephrosis. Risk Factors: Cancer Metastatic non-small lung cancer to brain, on radiation. Recent surgery for skin cancer on left calf. Limitations: Poor ultrasound/tissue interface and Edema, bowel gas. Comparison Study: No prior study on file Performing Technologist: Alberta Lis RVS  Examination Guidelines: A complete evaluation includes B-mode imaging, spectral Doppler, color Doppler, and power Doppler as needed of all accessible portions of each vessel. Bilateral testing is considered an integral part of a complete examination. Limited examinations for reoccurring indications may be performed as noted. The reflux portion of the exam is performed with the patient in reverse Trendelenburg.   +---------+---------------+---------+-----------+----------+-----------------+ RIGHT    CompressibilityPhasicitySpontaneityPropertiesThrombus Aging    +---------+---------------+---------+-----------+----------+-----------------+ CFV      Full           Yes      Yes                                    +---------+---------------+---------+-----------+----------+-----------------+ SFJ      Full                                                           +---------+---------------+---------+-----------+----------+-----------------+ FV Prox  Full           Yes      Yes                                    +---------+---------------+---------+-----------+----------+-----------------+ FV Mid   Full                                                           +---------+---------------+---------+-----------+----------+-----------------+ FV DistalFull                                                           +---------+---------------+---------+-----------+----------+-----------------+ PFV      Full           Yes      Yes                                    +---------+---------------+---------+-----------+----------+-----------------+ POP      None           No       No                   Age Indeterminate +---------+---------------+---------+-----------+----------+-----------------+ PTV      None                                         Age Indeterminate +---------+---------------+---------+-----------+----------+-----------------+ PERO     None  Age Indeterminate +---------+---------------+---------+-----------+----------+-----------------+   +---------+---------------+---------+-----------+----------+-------------------+ LEFT     CompressibilityPhasicitySpontaneityPropertiesThrombus Aging      +---------+---------------+---------+-----------+----------+-------------------+ CFV      None           No       No                    Acute               +---------+---------------+---------+-----------+----------+-------------------+ SFJ      None           No       No                   Acute               +---------+---------------+---------+-----------+----------+-------------------+ FV Prox  None           No       No                   Acute               +---------+---------------+---------+-----------+----------+-------------------+ FV Mid   None                                         Acute               +---------+---------------+---------+-----------+----------+-------------------+ FV DistalNone                                         Acute               +---------+---------------+---------+-----------+----------+-------------------+ PFV      None                                         Acute               +---------+---------------+---------+-----------+----------+-------------------+ POP      None           No       No                   Acute               +---------+---------------+---------+-----------+----------+-------------------+ PTV      None                                         Acute               +---------+---------------+---------+-----------+----------+-------------------+ PERO     None                                         Acute               +---------+---------------+---------+-----------+----------+-------------------+ Soleal   None  Acute               +---------+---------------+---------+-----------+----------+-------------------+ Gastroc  None                                         Acute               +---------+---------------+---------+-----------+----------+-------------------+ GSV      None                                         Acute in proximal                                                         portion              +---------+---------------+---------+-----------+----------+-------------------+ EIV                                                   Acute               +---------+---------------+---------+-----------+----------+-------------------+ IVC                                                   patent              +---------+---------------+---------+-----------+----------+-------------------+ Unable to visualized the common iliac veins secondary to bowel gas    Summary: RIGHT: - Findings consistent with age indeterminate deep vein thrombosis involving the right popliteal vein, right posterior tibial veins, and right peroneal veins.   LEFT: - Findings consistent with acute deep vein thrombosis involving the left common femoral vein, SF junction, left femoral vein, left proximal profunda vein, left popliteal vein, left posterior tibial veins, left peroneal veins, and External iliac vein. - Findings consistent with acute superficial vein thrombosis involving the left great saphenous vein. Findings consistent with acute intramuscular thrombosis involving the left gastrocnemius veins, and left soleal veins.  *See table(s) above for measurements and observations. Electronically signed by Fonda Rim on 08/08/2024 at 5:50:01 PM.    Final    DG Abd Portable 1V Result Date: 08/06/2024 CLINICAL DATA:  [id number] Nausea AND vomiting [id number] EXAM: PORTABLE ABDOMEN - 1 VIEW COMPARISON:  August 04, 2024 FINDINGS: Air and stool filled nondilated loops of bowel. Enteric contrast delineates predominantly bowel loops within the RIGHT hemicolon and transverse colon. Air is visualized in the rectum. Degenerative changes of the lumbar spine. IMPRESSION: Nonobstructive bowel gas pattern. Known colonic perforation is not visualized radiographically. Electronically Signed   By: Corean Salter M.D.   On: 08/06/2024 19:15   US  RENAL Result Date: 08/06/2024 CLINICAL DATA:  [id number] Hydronephrosis of left kidney [id number] EXAM:  RENAL / URINARY TRACT ULTRASOUND COMPLETE COMPARISON:  August 04, 2024 FINDINGS: Right Kidney: Renal measurements: 10.1 x 4.6 x 4.8 cm = volume: 114 mL. Echogenicity is mildly increased. No hydronephrosis visualized. Benign cyst  is noted measuring 14 mm (for which no dedicated imaging follow-up is recommended). Left Kidney: Renal measurements: 11.7 x 5.7 x 4.7 cm = volume: 163 mL. Echogenicity is increased. There is severe hydronephrosis. Bladder: Not visualized secondary to shadowing bowel gas Other: None. IMPRESSION: 1. Severe left hydronephrosis. 2. Increased echogenicity of the kidneys as can be seen in medical renal disease. Electronically Signed   By: Corean Salter M.D.   On: 08/06/2024 18:07   CT ABDOMEN PELVIS WO CONTRAST Result Date: 08/04/2024 CLINICAL DATA:  Left lower quadrant pain EXAM: CT ABDOMEN AND PELVIS WITHOUT CONTRAST TECHNIQUE: Multidetector CT imaging of the abdomen and pelvis was performed following the standard protocol without IV contrast. RADIATION DOSE REDUCTION: This exam was performed according to the departmental dose-optimization program which includes automated exposure control, adjustment of the mA and/or kV according to patient size and/or use of iterative reconstruction technique. COMPARISON:  CT 06/06/2024, 10/29/2023, 01/30/2024 FINDINGS: Lower chest: Advanced aortic atherosclerosis. Coronary vascular calcification. Cardiomegaly. Moderate to large hiatal hernia. Partially imaged slightly loculated appearing small moderate left pleural effusion. Mild subpleural atelectasis or minimal aspiration in the posterior right lung base. Hepatobiliary: No focal liver abnormality is seen. No gallstones, gallbladder wall thickening, or biliary dilatation. Pancreas: Unremarkable. No pancreatic ductal dilatation or surrounding inflammatory changes. Spleen: Normal in size without focal abnormality. Adrenals/Urinary Tract: Adrenal glands are normal. Mild cortical atrophy of the left  kidney. Multiple cysts and cortical hyperdensities, limited characterization without contrast but better characterized on prior MRI from 2024, no specific imaging follow-up recommended. 10 mm stone within the mid left kidney with additional smaller left kidney stones. Interim development of moderate severe left hydronephrosis and hydroureter, ureter dilated to the level of the pelvis, the distal ureter proximal to the bladder is decompressed. Bladder is unremarkable. Stomach/Bowel: Stomach is nondistended. No dilated small bowel. Diffuse diverticular disease of the colon. Focal wall thickening and mild inflammatory change at the sigmoid colon. New gas and fluid collection along the left pelvic sidewall, estimated measurement of 5.9 x 2.3 cm on series 2, image 63. Further assessment limited without contrast. Smaller gas and fluid collection in the left inferior pelvis, series 2, image 68. Vascular/Lymphatic: Aortic atherosclerosis. No enlarged abdominal or pelvic lymph nodes. Reproductive: Negative for adnexal mass Other: No ascites.  Small fat containing umbilical hernia Musculoskeletal: Scoliosis and degenerative changes. No acute osseous abnormality. IMPRESSION: 1. Diffuse diverticular disease of the colon. Focal wall thickening and mild inflammatory change at the sigmoid colon, probably representing diverticulitis with colon mass not excluded. New gas and fluid collection along the left pelvic sidewall measuring up to 5.9 cm, suspicious for focal/contained perforation or abscess, assessment limited in the absence of intravenous contrast. Smaller gas and fluid collection in the left inferior pelvis, this is indeterminate for an additional gas and fluid collection versus fluid containing inflamed large diverticulum. 2. Interim development of moderate to severe left hydronephrosis and hydroureter, ureter dilated to the level of the pelvis/area of inflammatory change at sigmoid colon. Suspect obstruction of the  distal left ureter by the colon inflammatory changes and gas and fluid collection. 3. Partially imaged slightly loculated appearing small to moderate left pleural effusion. Mild subpleural atelectasis or minimal aspiration in the posterior right lung base. 4. Moderate to large hiatal hernia. 5. Aortic atherosclerosis. These results will be called to the ordering clinician or representative by the Radiologist Assistant, and communication documented in the PACS or [employer]. Aortic Atherosclerosis (ICD10-I70.0). Electronically Signed   By: Luke Scott HERO.D.  On: 08/04/2024 22:20    Microbiology: Results for orders placed or performed during the hospital encounter of 08/03/24  Resp panel by RT-PCR (RSV, Flu A&B, Covid) Anterior Nasal Swab     Status: None   Collection Time: 08/03/24  9:21 AM   Specimen: Anterior Nasal Swab  Result Value Ref Range Status   SARS Coronavirus 2 by RT PCR NEGATIVE NEGATIVE Final    Comment: (NOTE) SARS-CoV-2 target nucleic acids are NOT DETECTED.  The SARS-CoV-2 RNA is generally detectable in upper respiratory specimens during the acute phase of infection. The lowest concentration of SARS-CoV-2 viral copies this assay can detect is 138 copies/mL. A negative result does not preclude SARS-Cov-2 infection and should not be used as the sole basis for treatment or other patient management decisions. A negative result may occur with  improper specimen collection/handling, submission of specimen other than nasopharyngeal swab, presence of viral mutation(s) within the areas targeted by this assay, and inadequate number of viral copies(<138 copies/mL). A negative result must be combined with clinical observations, patient history, and epidemiological information. The expected result is Negative.  Fact Sheet for Patients:  bloggercourse.com  Fact Sheet for Healthcare Providers:  seriousbroker.it  This test is no  t yet approved or cleared by the [place]  FDA and  has been authorized for detection and/or diagnosis of SARS-CoV-2 by FDA under an Emergency Use Authorization (EUA). This EUA will remain  in effect (meaning this test can be used) for the duration of the COVID-19 declaration under Section 564(b)(1) of the Act, 21 U.S.C.section 360bbb-3(b)(1), unless the authorization is terminated  or revoked sooner.       Influenza A by PCR NEGATIVE NEGATIVE Final   Influenza B by PCR NEGATIVE NEGATIVE Final    Comment: (NOTE) The Xpert Xpress SARS-CoV-2/FLU/RSV plus assay is intended as an aid in the diagnosis of influenza from Nasopharyngeal swab specimens and should not be used as a sole basis for treatment. Nasal washings and aspirates are unacceptable for Xpert Xpress SARS-CoV-2/FLU/RSV testing.  Fact Sheet for Patients: bloggercourse.com  Fact Sheet for Healthcare Providers: seriousbroker.it  This test is not yet approved or cleared by the [place]  FDA and has been authorized for detection and/or diagnosis of SARS-CoV-2 by FDA under an Emergency Use Authorization (EUA). This EUA will remain in effect (meaning this test can be used) for the duration of the COVID-19 declaration under Section 564(b)(1) of the Act, 21 U.S.C. section 360bbb-3(b)(1), unless the authorization is terminated or revoked.     Resp Syncytial Virus by PCR NEGATIVE NEGATIVE Final    Comment: (NOTE) Fact Sheet for Patients: bloggercourse.com  Fact Sheet for Healthcare Providers: seriousbroker.it  This test is not yet approved or cleared by the [place]  FDA and has been authorized for detection and/or diagnosis of SARS-CoV-2 by FDA under an Emergency Use Authorization (EUA). This EUA will remain in effect (meaning this test can be used) for the duration of the COVID-19 declaration under Section  564(b)(1) of the Act, 21 U.S.C. section 360bbb-3(b)(1), unless the authorization is terminated or revoked.  Performed at [facility], [address], [place], [place] [address]   Urine Culture (for pregnant, neutropenic or urologic patients or patients with an indwelling urinary catheter)     Status: Abnormal   Collection Time: 08/03/24 12:28 PM   Specimen: Urine, Clean Catch  Result Value Ref Range Status   Specimen Description   Final    URINE, CLEAN CATCH Performed at [facility],  [address], [place], [place] [address]    Special Requests   Final    NONE Performed at [facility], [address] [place], [place] [address]    Culture (A)  Final    30,000 COLONIES/mL PSEUDOMONAS AERUGINOSA WITHIN MIXED ORGANISMS Performed at [facility], [address] [place], [place] [address]    Report Status 08/05/2024 FINAL  Final   Organism ID, Bacteria PSEUDOMONAS AERUGINOSA (A)  Final      Susceptibility   Pseudomonas aeruginosa - MIC*    MEROPENEM  <=0.25 SENSITIVE Sensitive     CIPROFLOXACIN  >=4 RESISTANT Resistant     IMIPENEM 2 SENSITIVE Sensitive     PIP/TAZO Value in next row Sensitive      16 SENSITIVEThis is a modified FDA-approved test that has been validated and its performance characteristics determined by the reporting laboratory.  This laboratory is certified under the Clinical Laboratory Improvement Amendments CLIA as qualified to perform high complexity clinical laboratory testing.    CEFTAZIDIME /AVIBACTAM Value in next row Sensitive      16 SENSITIVEThis is a modified FDA-approved test that has been validated and its performance characteristics determined by the reporting laboratory.  This laboratory is certified under the Clinical Laboratory Improvement Amendments CLIA as qualified to perform high complexity clinical laboratory testing.    CEFTOLOZANE/TAZOBACTAM Value in next row Sensitive      16  SENSITIVEThis is a modified FDA-approved test that has been validated and its performance characteristics determined by the reporting laboratory.  This laboratory is certified under the Clinical Laboratory Improvement Amendments CLIA as qualified to perform high complexity clinical laboratory testing.    TOBRAMYCIN Value in next row Sensitive      16 SENSITIVEThis is a modified FDA-approved test that has been validated and its performance characteristics determined by the reporting laboratory.  This laboratory is certified under the Clinical Laboratory Improvement Amendments CLIA as qualified to perform high complexity clinical laboratory testing.    CEFTAZIDIME  Value in next row Sensitive      16 SENSITIVEThis is a modified FDA-approved test that has been validated and its performance characteristics determined by the reporting laboratory.  This laboratory is certified under the Clinical Laboratory Improvement Amendments CLIA as qualified to perform high complexity clinical laboratory testing.    * 30,000 COLONIES/mL PSEUDOMONAS AERUGINOSA  Resp panel by RT-PCR (RSV, Flu A&B, Covid) Anterior Nasal Swab     Status: None   Collection Time: 08/21/24  8:20 AM   Specimen: Anterior Nasal Swab  Result Value Ref Range Status   SARS Coronavirus 2 by RT PCR NEGATIVE NEGATIVE Final    Comment: (NOTE) SARS-CoV-2 target nucleic acids are NOT DETECTED.  The SARS-CoV-2 RNA is generally detectable in upper respiratory specimens during the acute phase of infection. The lowest concentration of SARS-CoV-2 viral copies this assay can detect is 138 copies/mL. A negative result does not preclude SARS-Cov-2 infection and should not be used as the sole basis for treatment or other patient management decisions. A negative result may occur with  improper specimen collection/handling, submission of specimen other than nasopharyngeal swab, presence of viral mutation(s) within the areas targeted by this assay, and  inadequate number of viral copies(<138 copies/mL). A negative result must be combined with clinical observations, patient history, and epidemiological information. The expected result is Negative.  Fact Sheet for Patients:  bloggercourse.com  Fact Sheet for Healthcare Providers:  seriousbroker.it  This test is no t yet approved or cleared by the [place]  [place] FDA and  has been authorized for detection and/or diagnosis of SARS-CoV-2 by FDA under an Emergency Use Authorization (EUA). This EUA will remain  in effect (meaning this test can be used) for the duration of the COVID-19 declaration under Section 564(b)(1) of the Act, 21 U.S.C.section 360bbb-3(b)(1), unless the authorization is terminated  or revoked sooner.       Influenza A by PCR NEGATIVE NEGATIVE Final   Influenza B by PCR NEGATIVE NEGATIVE Final    Comment: (NOTE) The Xpert Xpress SARS-CoV-2/FLU/RSV plus assay is intended as an aid in the diagnosis of influenza from Nasopharyngeal swab specimens and should not be used as a sole basis for treatment. Nasal washings and aspirates are unacceptable for Xpert Xpress SARS-CoV-2/FLU/RSV testing.  Fact Sheet for Patients: bloggercourse.com  Fact Sheet for Healthcare Providers: seriousbroker.it  This test is not yet approved or cleared by the [place]  FDA and has been authorized for detection and/or diagnosis of SARS-CoV-2 by FDA under an Emergency Use Authorization (EUA). This EUA will remain in effect (meaning this test can be used) for the duration of the COVID-19 declaration under Section 564(b)(1) of the Act, 21 U.S.C. section 360bbb-3(b)(1), unless the authorization is terminated or revoked.     Resp Syncytial Virus by PCR NEGATIVE NEGATIVE Final    Comment: (NOTE) Fact Sheet for Patients: bloggercourse.com  Fact Sheet for Healthcare  Providers: seriousbroker.it  This test is not yet approved or cleared by the [place]  FDA and has been authorized for detection and/or diagnosis of SARS-CoV-2 by FDA under an Emergency Use Authorization (EUA). This EUA will remain in effect (meaning this test can be used) for the duration of the COVID-19 declaration under Section 564(b)(1) of the Act, 21 U.S.C. section 360bbb-3(b)(1), unless the authorization is terminated or revoked.  Performed at [facility], [address], [place], [place] [address]   MRSA Next Gen by PCR, Nasal     Status: None   Collection Time: 08/22/24  3:47 PM   Specimen: Nasal Mucosa; Nasal Swab  Result Value Ref Range Status   MRSA by PCR Next Gen NOT DETECTED NOT DETECTED Final    Comment: (NOTE) The GeneXpert MRSA Assay (FDA approved for NASAL specimens only), is one component of a comprehensive MRSA colonization surveillance program. It is not intended to diagnose MRSA infection nor to guide or monitor treatment for MRSA infections. Test performance is not FDA approved in patients less than 21 years old. Performed at [facility], [address] [place], [place] [address]   Body fluid culture w Gram Stain     Status: None   Collection Time: 08/23/24  2:57 PM   Specimen: Lung, Left; Pleural Fluid  Result Value Ref Range Status   Specimen Description   Final    PLEURAL Performed at [facility], [address], [place], [place] [address]    Special Requests   Final    LUNG,LEFT Performed at [facility], [address], [place], [place] [address]    Gram Stain   Final    RARE WBC PRESENT, PREDOMINANTLY PMN NO ORGANISMS SEEN    Culture   Final    NO GROWTH 3 DAYS Performed at [facility], [address] [place], [place] [address]    Report Status 08/27/2024 FINAL  Final   *Note: Due to a large number of results and/or  encounters for the requested time period, some results have not been displayed. A  complete set of results can be found in Results Review.   Labs: CBC: No results for input(s): WBC, NEUTROABS, HGB, HCT, MCV, PLT in the last 168 hours. Basic Metabolic Panel: No results for input(s): NA, K, CL, CO2, GLUCOSE, BUN, CREATININE, CALCIUM , MG, PHOS in the last 168 hours. Liver Function Tests: No results for input(s): AST, ALT, ALKPHOS, BILITOT, PROT, ALBUMIN  in the last 168 hours. CBG: Recent Labs  Lab 09/02/24 0719 09/02/24 1122 09/02/24 1627 09/02/24 2121 09/03/24 0733  GLUCAP 109* 132* 138* 166* 126*    Discharge time spent: 35 minutes  Author: Yetta Blanch, MD  [facility]    "

## 2024-09-04 ENCOUNTER — Other Ambulatory Visit (HOSPITAL_COMMUNITY): Payer: Self-pay

## 2024-09-05 ENCOUNTER — Telehealth: Payer: Self-pay | Admitting: *Deleted

## 2024-09-05 ENCOUNTER — Other Ambulatory Visit (HOSPITAL_COMMUNITY): Payer: Self-pay

## 2024-09-05 NOTE — Transitions of Care (Post Inpatient/ED Visit) (Signed)
" ° °  09/05/2024  Name: Abigail [name] MRN: [id number] DOB: 04-10-1936   [facility] RN made outreach to verify receipt of hospice referral and start of care. RN confirmed that patient is actively enrolled in hospice program. Hospice Agency:[facility] RN spoke with:Ashley Start of Care Date:09/04/24 No further interventions at this time.   Andrea Dimes RN, BSN [place]  [facility] RN Care Manager [phone]    "

## 2024-09-12 ENCOUNTER — Inpatient Hospital Stay: Attending: Internal Medicine

## 2024-09-12 NOTE — Progress Notes (Incomplete)
*  pt name* presents today for follow up after completing   Recent neurologic symptoms, if any:  Seizures: {:18581} Headaches: {:18581} Nausea: {:18581} Dizziness/ataxia: {:18581} Difficulty with hand coordination: {:18581} Focal numbness/weakness: {:18581} Visual deficits/changes: {:18581} Confusion/Memory deficits: {:18581}  Other issues of note:

## 2024-09-15 ENCOUNTER — Encounter: Payer: Self-pay | Admitting: Internal Medicine

## 2024-09-19 ENCOUNTER — Inpatient Hospital Stay: Admitting: Internal Medicine

## 2024-09-20 ENCOUNTER — Ambulatory Visit (HOSPITAL_COMMUNITY)

## 2024-09-20 NOTE — Telephone Encounter (Signed)
 error

## 2024-09-21 ENCOUNTER — Telehealth: Payer: Self-pay | Admitting: Internal Medicine

## 2024-09-21 NOTE — Telephone Encounter (Signed)
 Called pt was unable to leave a vm because voice box is full

## 2024-09-26 ENCOUNTER — Inpatient Hospital Stay

## 2024-09-26 ENCOUNTER — Ambulatory Visit: Admitting: Radiation Oncology

## 2024-09-27 ENCOUNTER — Ambulatory Visit: Admitting: Radiology

## 2024-10-05 ENCOUNTER — Inpatient Hospital Stay: Attending: Internal Medicine

## 2024-10-05 ENCOUNTER — Ambulatory Visit (HOSPITAL_COMMUNITY)

## 2024-10-12 ENCOUNTER — Ambulatory Visit: Admitting: Internal Medicine

## 2024-10-13 ENCOUNTER — Inpatient Hospital Stay: Admitting: Internal Medicine

## 2025-04-19 ENCOUNTER — Ambulatory Visit: Payer: Self-pay | Admitting: Nurse Practitioner

## 2025-04-19 ENCOUNTER — Ambulatory Visit: Payer: Self-pay
